# Patient Record
Sex: Male | Born: 1935 | Race: Black or African American | Hispanic: No | Marital: Married | Smoking: Former smoker
Health system: Southern US, Community
[De-identification: ages and names within clinical notes are randomized; demographics above are authoritative.]

## PROBLEM LIST (undated history)

## (undated) DIAGNOSIS — I2510 Atherosclerotic heart disease of native coronary artery without angina pectoris: Secondary | ICD-10-CM

## (undated) DIAGNOSIS — Z8709 Personal history of other diseases of the respiratory system: Secondary | ICD-10-CM

## (undated) DIAGNOSIS — I480 Paroxysmal atrial fibrillation: Secondary | ICD-10-CM

## (undated) DIAGNOSIS — Z8719 Personal history of other diseases of the digestive system: Secondary | ICD-10-CM

## (undated) DIAGNOSIS — I5042 Chronic combined systolic (congestive) and diastolic (congestive) heart failure: Secondary | ICD-10-CM

## (undated) DIAGNOSIS — I639 Cerebral infarction, unspecified: Secondary | ICD-10-CM

## (undated) DIAGNOSIS — D649 Anemia, unspecified: Secondary | ICD-10-CM

## (undated) DIAGNOSIS — I6521 Occlusion and stenosis of right carotid artery: Secondary | ICD-10-CM

## (undated) DIAGNOSIS — I119 Hypertensive heart disease without heart failure: Secondary | ICD-10-CM

## (undated) DIAGNOSIS — Z950 Presence of cardiac pacemaker: Secondary | ICD-10-CM

## (undated) DIAGNOSIS — E785 Hyperlipidemia, unspecified: Secondary | ICD-10-CM

## (undated) DIAGNOSIS — I447 Left bundle-branch block, unspecified: Secondary | ICD-10-CM

## (undated) DIAGNOSIS — R0600 Dyspnea, unspecified: Secondary | ICD-10-CM

## (undated) DIAGNOSIS — N183 Chronic kidney disease, stage 3 unspecified: Secondary | ICD-10-CM

## (undated) DIAGNOSIS — M1990 Unspecified osteoarthritis, unspecified site: Secondary | ICD-10-CM

## (undated) DIAGNOSIS — I495 Sick sinus syndrome: Secondary | ICD-10-CM

## (undated) HISTORY — DX: Atherosclerotic heart disease of native coronary artery without angina pectoris: I25.10

## (undated) HISTORY — DX: Chronic kidney disease, stage 3 unspecified: N18.30

## (undated) HISTORY — DX: Hyperlipidemia, unspecified: E78.5

## (undated) HISTORY — DX: Presence of cardiac pacemaker: Z95.0

## (undated) HISTORY — DX: Personal history of other diseases of the digestive system: Z87.19

## (undated) HISTORY — DX: Cerebral infarction, unspecified: I63.9

## (undated) HISTORY — DX: Chronic kidney disease, stage 3 (moderate): N18.3

## (undated) HISTORY — DX: Chronic combined systolic (congestive) and diastolic (congestive) heart failure: I50.42

## (undated) HISTORY — DX: Anemia, unspecified: D64.9

## (undated) HISTORY — PX: PROSTATECTOMY: SHX69

## (undated) HISTORY — DX: Left bundle-branch block, unspecified: I44.7

## (undated) HISTORY — PX: HEMORRHOID SURGERY: SHX153

## (undated) HISTORY — DX: Personal history of other diseases of the respiratory system: Z87.09

## (undated) HISTORY — DX: Sick sinus syndrome: I49.5

---

## 1987-11-10 HISTORY — PX: CORONARY ARTERY BYPASS GRAFT: SHX141

## 1999-08-15 ENCOUNTER — Ambulatory Visit (HOSPITAL_COMMUNITY): Admission: RE | Admit: 1999-08-15 | Discharge: 1999-08-15 | Payer: Self-pay | Admitting: Cardiology

## 2000-07-13 ENCOUNTER — Ambulatory Visit (HOSPITAL_COMMUNITY): Admission: RE | Admit: 2000-07-13 | Discharge: 2000-07-13 | Payer: Self-pay | Admitting: General Surgery

## 2000-07-13 ENCOUNTER — Encounter (INDEPENDENT_AMBULATORY_CARE_PROVIDER_SITE_OTHER): Payer: Self-pay | Admitting: Specialist

## 2001-12-24 ENCOUNTER — Encounter: Payer: Self-pay | Admitting: Cardiology

## 2001-12-24 ENCOUNTER — Encounter: Admission: RE | Admit: 2001-12-24 | Discharge: 2001-12-24 | Payer: Self-pay | Admitting: Cardiology

## 2002-08-16 ENCOUNTER — Ambulatory Visit (HOSPITAL_COMMUNITY): Admission: RE | Admit: 2002-08-16 | Discharge: 2002-08-16 | Payer: Self-pay | Admitting: Cardiology

## 2002-08-16 ENCOUNTER — Encounter: Payer: Self-pay | Admitting: Cardiology

## 2002-09-13 ENCOUNTER — Ambulatory Visit (HOSPITAL_COMMUNITY): Admission: RE | Admit: 2002-09-13 | Discharge: 2002-09-13 | Payer: Self-pay | Admitting: *Deleted

## 2002-11-09 HISTORY — PX: CORONARY ANGIOPLASTY WITH STENT PLACEMENT: SHX49

## 2002-11-09 HISTORY — PX: CATARACT EXTRACTION: SUR2

## 2002-12-08 ENCOUNTER — Encounter: Payer: Self-pay | Admitting: Cardiology

## 2002-12-08 ENCOUNTER — Ambulatory Visit (HOSPITAL_COMMUNITY): Admission: RE | Admit: 2002-12-08 | Discharge: 2002-12-08 | Payer: Self-pay | Admitting: Cardiology

## 2003-07-24 ENCOUNTER — Encounter: Payer: Self-pay | Admitting: Cardiology

## 2003-07-24 ENCOUNTER — Ambulatory Visit (HOSPITAL_COMMUNITY): Admission: RE | Admit: 2003-07-24 | Discharge: 2003-07-25 | Payer: Self-pay | Admitting: Cardiology

## 2004-06-23 ENCOUNTER — Ambulatory Visit (HOSPITAL_COMMUNITY): Admission: RE | Admit: 2004-06-23 | Discharge: 2004-06-23 | Payer: Self-pay | Admitting: Cardiology

## 2005-02-17 ENCOUNTER — Encounter: Admission: RE | Admit: 2005-02-17 | Discharge: 2005-02-17 | Payer: Self-pay | Admitting: Cardiology

## 2005-04-22 ENCOUNTER — Encounter: Admission: RE | Admit: 2005-04-22 | Discharge: 2005-04-22 | Payer: Self-pay | Admitting: Cardiology

## 2005-07-23 ENCOUNTER — Ambulatory Visit (HOSPITAL_COMMUNITY): Admission: RE | Admit: 2005-07-23 | Discharge: 2005-07-23 | Payer: Self-pay | Admitting: Cardiology

## 2005-12-31 ENCOUNTER — Ambulatory Visit (HOSPITAL_COMMUNITY): Admission: RE | Admit: 2005-12-31 | Discharge: 2005-12-31 | Payer: Self-pay | Admitting: Gastroenterology

## 2006-07-07 ENCOUNTER — Emergency Department (HOSPITAL_COMMUNITY): Admission: EM | Admit: 2006-07-07 | Discharge: 2006-07-07 | Payer: Self-pay | Admitting: Emergency Medicine

## 2006-07-10 ENCOUNTER — Emergency Department (HOSPITAL_COMMUNITY): Admission: EM | Admit: 2006-07-10 | Discharge: 2006-07-10 | Payer: Self-pay | Admitting: Emergency Medicine

## 2006-07-14 ENCOUNTER — Inpatient Hospital Stay (HOSPITAL_COMMUNITY): Admission: AD | Admit: 2006-07-14 | Discharge: 2006-07-23 | Payer: Self-pay | Admitting: Cardiology

## 2006-07-14 ENCOUNTER — Encounter (INDEPENDENT_AMBULATORY_CARE_PROVIDER_SITE_OTHER): Payer: Self-pay | Admitting: Cardiology

## 2006-07-14 ENCOUNTER — Ambulatory Visit: Payer: Self-pay | Admitting: Infectious Diseases

## 2006-08-10 ENCOUNTER — Encounter: Admission: RE | Admit: 2006-08-10 | Discharge: 2006-08-10 | Payer: Self-pay | Admitting: Cardiology

## 2007-02-14 ENCOUNTER — Ambulatory Visit: Payer: Self-pay | Admitting: Oncology

## 2007-03-10 LAB — CBC & DIFF AND RETIC
BASO%: 0.4 % (ref 0.0–2.0)
Basophils Absolute: 0 10*3/uL (ref 0.0–0.1)
EOS%: 3.3 % (ref 0.0–7.0)
HCT: 39.3 % (ref 38.7–49.9)
HGB: 13.7 g/dL (ref 13.0–17.1)
IRF: 0.44 — ABNORMAL HIGH (ref 0.070–0.380)
MCH: 29.7 pg (ref 28.0–33.4)
MCHC: 34.8 g/dL (ref 32.0–35.9)
MONO#: 0.5 10*3/uL (ref 0.1–0.9)
NEUT%: 61.5 % (ref 40.0–75.0)
RDW: 21 % — ABNORMAL HIGH (ref 11.2–14.6)
RETIC #: 88.8 10*3/uL (ref 31.8–103.9)
WBC: 4.3 10*3/uL (ref 4.0–10.0)
lymph#: 0.9 10*3/uL (ref 0.9–3.3)

## 2007-03-14 LAB — FOLATE RBC: RBC Folate: 647 ng/mL — ABNORMAL HIGH (ref 180–600)

## 2007-03-14 LAB — COMPREHENSIVE METABOLIC PANEL
AST: 12 U/L (ref 0–37)
Albumin: 4 g/dL (ref 3.5–5.2)
Alkaline Phosphatase: 163 U/L — ABNORMAL HIGH (ref 39–117)
BUN: 14 mg/dL (ref 6–23)
Creatinine, Ser: 0.99 mg/dL (ref 0.40–1.50)
Potassium: 3.7 mEq/L (ref 3.5–5.3)
Total Bilirubin: 0.6 mg/dL (ref 0.3–1.2)

## 2007-03-14 LAB — IRON AND TIBC
%SAT: 17 % — ABNORMAL LOW (ref 20–55)
TIBC: 258 ug/dL (ref 215–435)
UIBC: 215 ug/dL

## 2007-03-14 LAB — IMMUNOFIXATION ELECTROPHORESIS
IgG (Immunoglobin G), Serum: 1890 mg/dL — ABNORMAL HIGH (ref 694–1618)
Total Protein, Serum Electrophoresis: 7.5 g/dL (ref 6.0–8.3)

## 2007-03-14 LAB — TRANSFERRIN RECEPTOR, SOLUABLE: Transferrin Receptor, Soluble: 24.1 nmol/L

## 2007-04-26 ENCOUNTER — Ambulatory Visit (HOSPITAL_COMMUNITY): Admission: RE | Admit: 2007-04-26 | Discharge: 2007-04-26 | Payer: Self-pay | Admitting: *Deleted

## 2007-04-26 ENCOUNTER — Encounter (INDEPENDENT_AMBULATORY_CARE_PROVIDER_SITE_OTHER): Payer: Self-pay | Admitting: Cardiology

## 2007-04-26 ENCOUNTER — Ambulatory Visit: Payer: Self-pay | Admitting: Vascular Surgery

## 2007-07-05 ENCOUNTER — Ambulatory Visit: Payer: Self-pay | Admitting: Oncology

## 2007-07-07 LAB — CBC WITH DIFFERENTIAL/PLATELET
Basophils Absolute: 0 10*3/uL (ref 0.0–0.1)
HCT: 39.5 % (ref 38.7–49.9)
HGB: 13.9 g/dL (ref 13.0–17.1)
MONO#: 0.5 10*3/uL (ref 0.1–0.9)
NEUT#: 2.2 10*3/uL (ref 1.5–6.5)
NEUT%: 62.4 % (ref 40.0–75.0)
RDW: 13.9 % (ref 11.2–14.6)
WBC: 3.6 10*3/uL — ABNORMAL LOW (ref 4.0–10.0)
lymph#: 0.7 10*3/uL — ABNORMAL LOW (ref 0.9–3.3)

## 2007-07-07 LAB — IRON AND TIBC
%SAT: 13 % — ABNORMAL LOW (ref 20–55)
TIBC: 248 ug/dL (ref 215–435)
UIBC: 217 ug/dL

## 2007-07-07 LAB — FERRITIN: Ferritin: 40 ng/mL (ref 22–322)

## 2007-10-13 ENCOUNTER — Encounter: Admission: RE | Admit: 2007-10-13 | Discharge: 2007-10-13 | Payer: Self-pay | Admitting: Cardiology

## 2007-11-08 ENCOUNTER — Ambulatory Visit: Payer: Self-pay | Admitting: Oncology

## 2007-11-11 LAB — CBC WITH DIFFERENTIAL/PLATELET
BASO%: 0.3 % (ref 0.0–2.0)
Basophils Absolute: 0 10*3/uL (ref 0.0–0.1)
EOS%: 2.6 % (ref 0.0–7.0)
HGB: 14 g/dL (ref 13.0–17.1)
MCH: 30.9 pg (ref 28.0–33.4)
MCHC: 34.5 g/dL (ref 32.0–35.9)
MCV: 89.7 fL (ref 81.6–98.0)
MONO%: 12.4 % (ref 0.0–13.0)
RDW: 15.4 % — ABNORMAL HIGH (ref 11.2–14.6)

## 2007-11-11 LAB — IRON AND TIBC: Iron: 124 ug/dL (ref 42–165)

## 2008-03-07 ENCOUNTER — Ambulatory Visit: Payer: Self-pay | Admitting: Oncology

## 2008-03-09 LAB — CBC WITH DIFFERENTIAL/PLATELET
Basophils Absolute: 0 10*3/uL (ref 0.0–0.1)
Eosinophils Absolute: 0.2 10*3/uL (ref 0.0–0.5)
HGB: 13 g/dL (ref 13.0–17.1)
MCV: 89.7 fL (ref 81.6–98.0)
MONO%: 11.5 % (ref 0.0–13.0)
NEUT#: 2.3 10*3/uL (ref 1.5–6.5)
RDW: 14.3 % (ref 11.2–14.6)
lymph#: 0.8 10*3/uL — ABNORMAL LOW (ref 0.9–3.3)

## 2008-03-09 LAB — COMPREHENSIVE METABOLIC PANEL WITH GFR
ALT: 9 U/L (ref 0–53)
AST: 13 U/L (ref 0–37)
Albumin: 3.7 g/dL (ref 3.5–5.2)
Alkaline Phosphatase: 130 U/L — ABNORMAL HIGH (ref 39–117)
BUN: 16 mg/dL (ref 6–23)
CO2: 25 meq/L (ref 19–32)
Calcium: 8.5 mg/dL (ref 8.4–10.5)
Chloride: 109 meq/L (ref 96–112)
Creatinine, Ser: 0.95 mg/dL (ref 0.40–1.50)
Glucose, Bld: 123 mg/dL — ABNORMAL HIGH (ref 70–99)
Potassium: 3.6 meq/L (ref 3.5–5.3)
Sodium: 143 meq/L (ref 135–145)
Total Bilirubin: 0.6 mg/dL (ref 0.3–1.2)
Total Protein: 7 g/dL (ref 6.0–8.3)

## 2008-03-09 LAB — IRON AND TIBC: %SAT: 24 % (ref 20–55)

## 2008-03-09 LAB — FERRITIN: Ferritin: 47 ng/mL (ref 22–322)

## 2008-03-28 ENCOUNTER — Ambulatory Visit: Payer: Self-pay | Admitting: Vascular Surgery

## 2008-03-28 ENCOUNTER — Encounter (INDEPENDENT_AMBULATORY_CARE_PROVIDER_SITE_OTHER): Payer: Self-pay | Admitting: Cardiology

## 2008-03-28 ENCOUNTER — Ambulatory Visit (HOSPITAL_COMMUNITY): Admission: RE | Admit: 2008-03-28 | Discharge: 2008-03-28 | Payer: Self-pay | Admitting: Cardiology

## 2008-07-10 DIAGNOSIS — I495 Sick sinus syndrome: Secondary | ICD-10-CM | POA: Diagnosis present

## 2008-07-10 DIAGNOSIS — Z950 Presence of cardiac pacemaker: Secondary | ICD-10-CM | POA: Diagnosis present

## 2008-07-10 HISTORY — DX: Sick sinus syndrome: I49.5

## 2008-07-10 HISTORY — DX: Presence of cardiac pacemaker: Z95.0

## 2008-07-13 ENCOUNTER — Encounter: Admission: RE | Admit: 2008-07-13 | Discharge: 2008-07-13 | Payer: Self-pay | Admitting: Cardiology

## 2008-07-15 ENCOUNTER — Inpatient Hospital Stay (HOSPITAL_COMMUNITY): Admission: EM | Admit: 2008-07-15 | Discharge: 2008-07-18 | Payer: Self-pay | Admitting: Emergency Medicine

## 2008-07-15 ENCOUNTER — Ambulatory Visit: Payer: Self-pay | Admitting: Internal Medicine

## 2008-07-16 ENCOUNTER — Encounter (INDEPENDENT_AMBULATORY_CARE_PROVIDER_SITE_OTHER): Payer: Self-pay | Admitting: Cardiology

## 2008-07-17 HISTORY — PX: INSERT / REPLACE / REMOVE PACEMAKER: SUR710

## 2008-08-01 ENCOUNTER — Ambulatory Visit: Payer: Self-pay

## 2008-08-08 ENCOUNTER — Inpatient Hospital Stay (HOSPITAL_COMMUNITY): Admission: AD | Admit: 2008-08-08 | Discharge: 2008-08-16 | Payer: Self-pay | Admitting: Cardiology

## 2008-08-29 ENCOUNTER — Ambulatory Visit: Payer: Self-pay | Admitting: Oncology

## 2008-08-31 LAB — CBC WITH DIFFERENTIAL/PLATELET
Eosinophils Absolute: 0.2 10*3/uL (ref 0.0–0.5)
MONO#: 0.5 10*3/uL (ref 0.1–0.9)
MONO%: 9.8 % (ref 0.0–13.0)
NEUT#: 3.4 10*3/uL (ref 1.5–6.5)
RBC: 3.61 10*6/uL — ABNORMAL LOW (ref 4.20–5.71)
RDW: 18.1 % — ABNORMAL HIGH (ref 11.2–14.6)
WBC: 4.7 10*3/uL (ref 4.0–10.0)
lymph#: 0.7 10*3/uL — ABNORMAL LOW (ref 0.9–3.3)

## 2008-09-04 LAB — COMPREHENSIVE METABOLIC PANEL
Albumin: 3.7 g/dL (ref 3.5–5.2)
Alkaline Phosphatase: 101 U/L (ref 39–117)
CO2: 25 mEq/L (ref 19–32)
Chloride: 109 mEq/L (ref 96–112)
Glucose, Bld: 136 mg/dL — ABNORMAL HIGH (ref 70–99)
Potassium: 3.6 mEq/L (ref 3.5–5.3)
Sodium: 142 mEq/L (ref 135–145)
Total Protein: 6.5 g/dL (ref 6.0–8.3)

## 2008-09-04 LAB — IRON AND TIBC: Iron: 26 ug/dL — ABNORMAL LOW (ref 42–165)

## 2008-09-04 LAB — FERRITIN: Ferritin: 55 ng/mL (ref 22–322)

## 2008-10-23 ENCOUNTER — Ambulatory Visit: Payer: Self-pay | Admitting: Internal Medicine

## 2008-11-12 ENCOUNTER — Inpatient Hospital Stay (HOSPITAL_COMMUNITY): Admission: EM | Admit: 2008-11-12 | Discharge: 2008-11-16 | Payer: Self-pay | Admitting: Emergency Medicine

## 2009-02-27 ENCOUNTER — Ambulatory Visit: Payer: Self-pay | Admitting: Oncology

## 2009-03-01 ENCOUNTER — Encounter (INDEPENDENT_AMBULATORY_CARE_PROVIDER_SITE_OTHER): Payer: Self-pay | Admitting: Radiology

## 2009-03-01 LAB — CBC WITH DIFFERENTIAL/PLATELET
BASO%: 0.1 % (ref 0.0–2.0)
LYMPH%: 18.7 % (ref 14.0–49.0)
MCHC: 34 g/dL (ref 32.0–36.0)
MONO#: 0.5 10*3/uL (ref 0.1–0.9)
MONO%: 12.7 % (ref 0.0–14.0)
Platelets: 140 10*3/uL (ref 140–400)
RBC: 4.42 10*6/uL (ref 4.20–5.82)
RDW: 14.7 % — ABNORMAL HIGH (ref 11.0–14.6)
WBC: 3.8 10*3/uL — ABNORMAL LOW (ref 4.0–10.3)

## 2009-03-04 LAB — COMPREHENSIVE METABOLIC PANEL
ALT: 8 U/L (ref 0–53)
Albumin: 3.9 g/dL (ref 3.5–5.2)
Alkaline Phosphatase: 137 U/L — ABNORMAL HIGH (ref 39–117)
CO2: 27 mEq/L (ref 19–32)
Potassium: 3.6 mEq/L (ref 3.5–5.3)
Sodium: 141 mEq/L (ref 135–145)
Total Bilirubin: 0.4 mg/dL (ref 0.3–1.2)
Total Protein: 7.6 g/dL (ref 6.0–8.3)

## 2009-03-04 LAB — IRON AND TIBC
%SAT: 16 % — ABNORMAL LOW (ref 20–55)
TIBC: 232 ug/dL (ref 215–435)

## 2009-07-07 DIAGNOSIS — Z950 Presence of cardiac pacemaker: Secondary | ICD-10-CM | POA: Insufficient documentation

## 2009-07-07 DIAGNOSIS — I200 Unstable angina: Secondary | ICD-10-CM | POA: Insufficient documentation

## 2009-07-07 DIAGNOSIS — I10 Essential (primary) hypertension: Secondary | ICD-10-CM | POA: Insufficient documentation

## 2009-07-07 DIAGNOSIS — Z951 Presence of aortocoronary bypass graft: Secondary | ICD-10-CM | POA: Insufficient documentation

## 2009-07-12 ENCOUNTER — Ambulatory Visit: Payer: Self-pay | Admitting: Internal Medicine

## 2009-08-29 ENCOUNTER — Ambulatory Visit: Payer: Self-pay | Admitting: Oncology

## 2009-09-02 LAB — CBC WITH DIFFERENTIAL/PLATELET
BASO%: 0.5 % (ref 0.0–2.0)
EOS%: 3.8 % (ref 0.0–7.0)
LYMPH%: 25.1 % (ref 14.0–49.0)
MCHC: 33.4 g/dL (ref 32.0–36.0)
MCV: 92.5 fL (ref 79.3–98.0)
MONO%: 13.6 % (ref 0.0–14.0)
NEUT#: 2.4 10*3/uL (ref 1.5–6.5)
Platelets: 172 10*3/uL (ref 140–400)
RBC: 3.88 10*6/uL — ABNORMAL LOW (ref 4.20–5.82)
RDW: 14.8 % — ABNORMAL HIGH (ref 11.0–14.6)
nRBC: 0 % (ref 0–0)

## 2009-09-04 LAB — COMPREHENSIVE METABOLIC PANEL
ALT: 9 U/L (ref 0–53)
AST: 10 U/L (ref 0–37)
Alkaline Phosphatase: 116 U/L (ref 39–117)
CO2: 25 mEq/L (ref 19–32)
Creatinine, Ser: 1.17 mg/dL (ref 0.40–1.50)
Sodium: 139 mEq/L (ref 135–145)
Total Bilirubin: 0.7 mg/dL (ref 0.3–1.2)
Total Protein: 7.1 g/dL (ref 6.0–8.3)

## 2009-09-04 LAB — LACTATE DEHYDROGENASE: LDH: 134 U/L (ref 94–250)

## 2009-09-04 LAB — FERRITIN: Ferritin: 183 ng/mL (ref 22–322)

## 2009-09-04 LAB — IRON AND TIBC
%SAT: 33 % (ref 20–55)
TIBC: 201 ug/dL — ABNORMAL LOW (ref 215–435)

## 2009-09-06 ENCOUNTER — Ambulatory Visit: Payer: Self-pay | Admitting: Internal Medicine

## 2009-12-06 ENCOUNTER — Ambulatory Visit: Payer: Self-pay | Admitting: Internal Medicine

## 2010-03-05 ENCOUNTER — Ambulatory Visit: Payer: Self-pay | Admitting: Oncology

## 2010-03-06 LAB — CBC WITH DIFFERENTIAL/PLATELET
Basophils Absolute: 0 10*3/uL (ref 0.0–0.1)
EOS%: 3.7 % (ref 0.0–7.0)
HGB: 12.9 g/dL — ABNORMAL LOW (ref 13.0–17.1)
LYMPH%: 38.5 % (ref 14.0–49.0)
MCH: 32.8 pg (ref 27.2–33.4)
MCV: 95.7 fL (ref 79.3–98.0)
MONO%: 10 % (ref 0.0–14.0)
NEUT%: 47.3 % (ref 39.0–75.0)
Platelets: 155 10*3/uL (ref 140–400)
RDW: 14.4 % (ref 11.0–14.6)

## 2010-03-07 ENCOUNTER — Ambulatory Visit: Payer: Self-pay | Admitting: Internal Medicine

## 2010-03-08 LAB — IRON AND TIBC
%SAT: 41 % (ref 20–55)
Iron: 99 ug/dL (ref 42–165)
TIBC: 243 ug/dL (ref 215–435)
UIBC: 144 ug/dL

## 2010-03-08 LAB — COMPREHENSIVE METABOLIC PANEL
AST: 10 U/L (ref 0–37)
Alkaline Phosphatase: 101 U/L (ref 39–117)
BUN: 18 mg/dL (ref 6–23)
Creatinine, Ser: 1.33 mg/dL (ref 0.40–1.50)
Potassium: 3.8 mEq/L (ref 3.5–5.3)
Total Bilirubin: 0.8 mg/dL (ref 0.3–1.2)

## 2010-03-08 LAB — FERRITIN: Ferritin: 185 ng/mL (ref 22–322)

## 2010-03-08 LAB — TRANSFERRIN RECEPTOR, SOLUABLE: Transferrin Receptor, Soluble: 16.1 nmol/L

## 2010-05-14 ENCOUNTER — Encounter: Admission: RE | Admit: 2010-05-14 | Discharge: 2010-05-14 | Payer: Self-pay | Admitting: Cardiology

## 2010-06-06 ENCOUNTER — Encounter: Payer: Self-pay | Admitting: Internal Medicine

## 2010-06-06 ENCOUNTER — Ambulatory Visit: Payer: Self-pay | Admitting: Internal Medicine

## 2010-07-11 ENCOUNTER — Encounter: Admission: RE | Admit: 2010-07-11 | Discharge: 2010-07-11 | Payer: Self-pay | Admitting: Cardiology

## 2010-08-12 ENCOUNTER — Ambulatory Visit: Payer: Self-pay | Admitting: Internal Medicine

## 2010-09-02 ENCOUNTER — Ambulatory Visit: Payer: Self-pay | Admitting: Oncology

## 2010-09-04 LAB — COMPREHENSIVE METABOLIC PANEL
ALT: 10 U/L (ref 0–53)
BUN: 16 mg/dL (ref 6–23)
CO2: 25 mEq/L (ref 19–32)
Calcium: 8.3 mg/dL — ABNORMAL LOW (ref 8.4–10.5)
Chloride: 107 mEq/L (ref 96–112)
Creatinine, Ser: 1.1 mg/dL (ref 0.40–1.50)

## 2010-09-04 LAB — CBC WITH DIFFERENTIAL/PLATELET
Basophils Absolute: 0 10*3/uL (ref 0.0–0.1)
Eosinophils Absolute: 0.2 10*3/uL (ref 0.0–0.5)
HCT: 33.9 % — ABNORMAL LOW (ref 38.4–49.9)
HGB: 11.3 g/dL — ABNORMAL LOW (ref 13.0–17.1)
LYMPH%: 26.8 % (ref 14.0–49.0)
MCV: 93.9 fL (ref 79.3–98.0)
MONO%: 14 % (ref 0.0–14.0)
NEUT#: 2 10*3/uL (ref 1.5–6.5)
Platelets: 139 10*3/uL — ABNORMAL LOW (ref 140–400)

## 2010-09-04 LAB — IRON AND TIBC
%SAT: 22 % (ref 20–55)
Iron: 48 ug/dL (ref 42–165)

## 2010-09-05 ENCOUNTER — Ambulatory Visit: Payer: Self-pay | Admitting: Internal Medicine

## 2010-11-30 ENCOUNTER — Encounter: Payer: Self-pay | Admitting: Cardiovascular Disease

## 2010-12-05 ENCOUNTER — Encounter: Payer: Self-pay | Admitting: Internal Medicine

## 2010-12-09 ENCOUNTER — Ambulatory Visit: Payer: Self-pay | Admitting: Internal Medicine

## 2010-12-09 NOTE — Cardiovascular Report (Signed)
Summary: TTM   TTM   Imported By: Roderic Ovens 09/23/2010 16:16:41  _____________________________________________________________________  External Attachment:    Type:   Image     Comment:   External Document

## 2010-12-09 NOTE — Cardiovascular Report (Signed)
Summary: TTM   TTM   Imported By: Roderic Ovens 04/03/2010 11:33:08  _____________________________________________________________________  External Attachment:    Type:   Image     Comment:   External Document

## 2010-12-09 NOTE — Cardiovascular Report (Signed)
Summary: TTM   TTM   Imported By: Roderic Ovens 07/10/2010 11:38:16  _____________________________________________________________________  External Attachment:    Type:   Image     Comment:   External Document

## 2010-12-09 NOTE — Assessment & Plan Note (Signed)
Summary: pc2   Visit Type:  Follow-up Primary Provider:  Donia Peck,    History of Present Illness: [name] returns today for PPM followup.  He is an 77 yo man with a h/o symptomatic bradycardia, HTN and CAD.  He denies c/p or sob.  He notes mild peripheral edema.  He remains active.  No syncope or dizziness.  Current Medications (verified): 1)  Plavix 75 Mg Tabs (Clopidogrel Bisulfate) .... Take One Tablet By Mouth Daily 2)  Carvedilol 25 Mg Tabs (Carvedilol) .... Take One Tablet By Mouth Twice A Day 3)  Furosemide 20 Mg Tabs (Furosemide) .... Take One Tablet By Mouth Daily. 4)  Aspirin 81 Mg Tbec (Aspirin) .... Take One Tablet By Mouth Daily 5)  Ranexa 500 Mg Xr12h-Tab (Ranolazine) .Marland Kitchen.. 1 Tab By Mouth Two Times A Day 6)  Temazepam 30 Mg Caps (Temazepam) .Marland Kitchen.. 1 Tab By Mouth At Bedtime 7)  Tamsulosin Hcl 0.4 Mg Caps (Tamsulosin Hcl) .... Once Daily 8)  Isosorbide Mononitrate Cr 60 Mg Xr24h-Tab (Isosorbide Mononitrate) .... Take One Tablet By Mouth Daily 9)  Lorazepam 1 Mg Tabs (Lorazepam) .Marland Kitchen.. 1 Tab By Mouth Three Times A Day As Needed 10)  Amlodipine Besylate 5 Mg Tabs (Amlodipine Besylate) .... Take One Tablet By Mouth Daily 11)  Ferrous Sulfate 325 (65 Fe) Mg  Tabs (Ferrous Sulfate) .... Three Times A Day 12)  Simvastatin 40 Mg Tabs (Simvastatin) .... Take One Tablet By Mouth Daily At Bedtime 13)  Stool Softener 100 Mg Caps (Docusate Sodium) .... 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    DATE OF PROCEDURE:  08/13/2008  OPERATION:  Colonoscopy    PHYSICIAN:  John Peck, M.D.      DATE OF PROCEDURE:  08/09/2008   PROCEDURE:  EGD.    DATE OF  PROCEDURE:  07/17/2008  :                                  OPERATIVE REPORT      PRIMARY SURGEON:  John Canning. Ladona Ridgel, MD.      FIRST ASSISTANT:  John Range, MD      PREPROCEDURE DIAGNOSES:   1. Persistent atrial fibrillation.   2. Symptomatic bradycardia.   3. Diastolic dysfunction.      POSTPROCEDURE DIAGNOSES:   1. Persistent atrial fibrillation..   2. Symptomatic bradycardia.   3. Diastolic dysfunction.      PROCEDURES:   1. Dual chamber pacemaker implantation.   2. Left upper extremity venography.      PHYSICIAN:  John Peck, M.D.    DATE OF PROCEDURE:  07/21/2006  PROCEDURE:  Upper endoscopy.   PHYSICIAN:  John Friar, MD DATE OF PROCEDURE:  12/31/2005  PROCEDURE PERFORMED:  Colonoscopy.   PHYSICIAN:  John Dross., M.D.               DATE OF PROCEDURE:  09/13/2002                                  OPERATIVE REPORT   PREOPERATIVE DIAGNOSES:  Screening for colon cancer.   POSTOPERATIVE DIAGNOSES:  Diverticulosis  in the rectosigmoid and descending  colon region; otherwise, normal colonoscopic examination to the cecum.   PROCEDURE PERFORMED:  Colonoscopy.  Review of Systems  The patient denies chest pain, syncope, dyspnea on exertion, and peripheral edema.    Vital Signs:  Patient profile:   77 year old male Height:      69 inches Weight:      189 pounds BMI:     28.01 Pulse rate:   60 / minute BP sitting:   128 / 64  (left arm)  Vitals Entered By: John Peck (August 12, 2010 9:44 AM)  Physical Exam  General:  Well developed, well nourished, in no acute distress. Head:  normocephalic and atraumatic Eyes:  PERRLA/EOM intact; conjunctiva and lids normal. Mouth:  Teeth, gums and palate normal. Oral mucosa normal. Neck:  Neck supple, no JVD. No masses, thyromegaly or abnormal cervical nodes. Chest Wall:  Well healed PPM incision. Lungs:  Clear bilaterally to auscultation with no wheezes, rales or rhonchi. Heart:  Distant.  RRR with  normal S1 and S2.  A soft systolic murmur is present at the apex. Abdomen:  Bowel sounds positive; abdomen soft and non-tender without masses, organomegaly, or hernias noted. No hepatosplenomegaly. Msk:  Back normal, normal gait. Muscle strength and tone normal. Pulses:  pulses normal in all 4 extremities Extremities:  No clubbing or cyanosis. Neurologic:  Alert and oriented x 3.   PPM Specifications Following MD:  John Bunting, MD     PPM Vendor:  St Jude     PPM Model Number:  [id number]     PPM Serial Number:  [id number] PPM DOI:  07/17/2008     PPM Implanting MD:  John Bunting, MD  Lead 1    Location: RA     DOI: 07/17/2008     Model #: [id number]     Serial #: [id number]     Status: active Lead 2    Location: RV     DOI: 07/17/2008     Model #: [id number]     Serial #: [id number]     Status: active   Indications:  A-FIB/BRADY   PPM Follow Up Battery Voltage:  2.79 V     Battery Est. Longevity:  10 yrs     Pacer Dependent:  No       PPM Device Measurements Atrium  Amplitude: 4.4 mV, Impedance: 390 ohms, Threshold: 0.25 V at 0.4 msec Right Ventricle  Amplitude: 8.6 mV, Impedance: 470 ohms, Threshold: 1.0 V at 0.4 msec  Episodes MS Episodes:  46     Percent Mode Switch:  <1%     Ventricular High Rate:  0     Atrial Pacing:  90%     Ventricular Pacing:  79%  Parameters Mode:  DDDR     Lower Rate Limit:  60     Upper Rate Limit:  110 Paced AV Delay:  200     Sensed AV Delay:  200 Rate Response Parameters:  Threshold-Auto(-0.5), Slope-10, Reaction time-fast, Recovery time-medium Next Cardiology Appt Due:  02/09/2011 Tech Comments:  46 AMS EPISODES--LONGEST WAS 1 MIN 6 SECONDS.  NORMAL DEVICE FUNCTION. NO CHANGES MADE. MEDNET IN 3 MTHS. ROV IN 6 MTHS W/DEVICE CLINIC. John Peck  August 12, 2010 9:55 AM MD Comments:  Agree with above.  Impression & Recommendations:  Problem # 1:  PACEMAKER, PERMANENT (ICD-V45.01) His device is working normally. Will recheck in several months.  Problem # 2:   HYPERTENSION (ICD-401.9) His  blood pressure is well controlled. A low sodium diet is requested. His updated medication list for this problem includes:    Carvedilol 25 Mg Tabs (Carvedilol) .Marland Kitchen... Take one tablet by mouth twice a day    Furosemide 20 Mg Tabs (Furosemide) .Marland Kitchen... Take one tablet by mouth daily.    Aspirin 81 Mg Tbec (Aspirin) .Marland Kitchen... Take one tablet by mouth daily    Amlodipine Besylate 5 Mg Tabs (Amlodipine besylate) .Marland Kitchen... Take one tablet by mouth daily

## 2010-12-09 NOTE — Cardiovascular Report (Signed)
Summary: TTM   TTM   Imported By: Roderic Ovens 01/06/2010 13:34:22  _____________________________________________________________________  External Attachment:    Type:   Image     Comment:   External Document

## 2010-12-09 NOTE — Cardiovascular Report (Signed)
Summary: Office Visit   Office Visit   Imported By: Roderic Ovens 08/18/2010 11:38:12  _____________________________________________________________________  External Attachment:    Type:   Image     Comment:   External Document

## 2010-12-25 NOTE — Cardiovascular Report (Signed)
Summary: TTM   TTM   Imported By: Roderic Ovens 12/18/2010 14:58:07  _____________________________________________________________________  External Attachment:    Type:   Image     Comment:   External Document

## 2011-02-23 LAB — CBC
HCT: 42.2 % (ref 39.0–52.0)
HCT: 43.6 % (ref 39.0–52.0)
Hemoglobin: 13.8 g/dL (ref 13.0–17.0)
Hemoglobin: 14.3 g/dL (ref 13.0–17.0)
MCHC: 32.8 g/dL (ref 30.0–36.0)
MCHC: 34.3 g/dL (ref 30.0–36.0)
MCV: 90.2 fL (ref 78.0–100.0)
MCV: 91.9 fL (ref 78.0–100.0)
MCV: 92.5 fL (ref 78.0–100.0)
Platelets: 141 10*3/uL — ABNORMAL LOW (ref 150–400)
Platelets: 148 10*3/uL — ABNORMAL LOW (ref 150–400)
RDW: 14 % (ref 11.5–15.5)
RDW: 14 % (ref 11.5–15.5)
RDW: 14.3 % (ref 11.5–15.5)

## 2011-02-23 LAB — CK TOTAL AND CKMB (NOT AT ARMC)
Relative Index: INVALID (ref 0.0–2.5)
Total CK: 66 U/L (ref 7–232)

## 2011-02-23 LAB — POCT I-STAT, CHEM 8
BUN: 12 mg/dL (ref 6–23)
Calcium, Ion: 1.13 mmol/L (ref 1.12–1.32)
Creatinine, Ser: 1.2 mg/dL (ref 0.4–1.5)
Hemoglobin: 13.3 g/dL (ref 13.0–17.0)
Sodium: 142 mEq/L (ref 135–145)
TCO2: 25 mmol/L (ref 0–100)

## 2011-02-23 LAB — CARDIAC PANEL(CRET KIN+CKTOT+MB+TROPI)
CK, MB: 0.9 ng/mL (ref 0.3–4.0)
CK, MB: 1 ng/mL (ref 0.3–4.0)
Relative Index: INVALID (ref 0.0–2.5)
Total CK: 74 U/L (ref 7–232)
Troponin I: 0.01 ng/mL (ref 0.00–0.06)
Troponin I: 0.02 ng/mL (ref 0.00–0.06)

## 2011-02-23 LAB — POCT CARDIAC MARKERS
Myoglobin, poc: 79.2 ng/mL (ref 12–200)
Myoglobin, poc: 88.9 ng/mL (ref 12–200)

## 2011-02-23 LAB — BASIC METABOLIC PANEL
BUN: 18 mg/dL (ref 6–23)
BUN: 8 mg/dL (ref 6–23)
CO2: 25 mEq/L (ref 19–32)
Chloride: 104 mEq/L (ref 96–112)
Chloride: 106 mEq/L (ref 96–112)
Glucose, Bld: 82 mg/dL (ref 70–99)
Glucose, Bld: 86 mg/dL (ref 70–99)
Potassium: 3.5 mEq/L (ref 3.5–5.1)
Potassium: 3.6 mEq/L (ref 3.5–5.1)
Sodium: 139 mEq/L (ref 135–145)

## 2011-02-23 LAB — DIFFERENTIAL
Basophils Absolute: 0 10*3/uL (ref 0.0–0.1)
Basophils Relative: 0 % (ref 0–1)
Eosinophils Absolute: 0.2 10*3/uL (ref 0.0–0.7)
Neutro Abs: 2.3 10*3/uL (ref 1.7–7.7)
Neutrophils Relative %: 58 % (ref 43–77)

## 2011-02-23 LAB — LIPID PANEL: Cholesterol: 212 mg/dL — ABNORMAL HIGH (ref 0–200)

## 2011-02-23 LAB — PROTIME-INR: INR: 1.2 (ref 0.00–1.49)

## 2011-02-23 LAB — TROPONIN I: Troponin I: 0.02 ng/mL (ref 0.00–0.06)

## 2011-03-06 ENCOUNTER — Encounter: Payer: Self-pay | Admitting: Internal Medicine

## 2011-03-06 DIAGNOSIS — I498 Other specified cardiac arrhythmias: Secondary | ICD-10-CM

## 2011-03-12 ENCOUNTER — Other Ambulatory Visit (HOSPITAL_COMMUNITY): Payer: Self-pay | Admitting: Oncology

## 2011-03-12 ENCOUNTER — Encounter (HOSPITAL_BASED_OUTPATIENT_CLINIC_OR_DEPARTMENT_OTHER): Payer: Medicare Other | Admitting: Oncology

## 2011-03-12 DIAGNOSIS — D509 Iron deficiency anemia, unspecified: Secondary | ICD-10-CM

## 2011-03-12 LAB — CBC WITH DIFFERENTIAL/PLATELET
BASO%: 0.4 % (ref 0.0–2.0)
Basophils Absolute: 0 10*3/uL (ref 0.0–0.1)
EOS%: 3.1 % (ref 0.0–7.0)
Eosinophils Absolute: 0.2 10*3/uL (ref 0.0–0.5)
HCT: 34.6 % — ABNORMAL LOW (ref 38.4–49.9)
HGB: 11.5 g/dL — ABNORMAL LOW (ref 13.0–17.1)
LYMPH%: 30.8 % (ref 14.0–49.0)
MCH: 30.6 pg (ref 27.2–33.4)
MCHC: 33.2 g/dL (ref 32.0–36.0)
MCV: 92 fL (ref 79.3–98.0)
MONO#: 0.7 10*3/uL (ref 0.1–0.9)
MONO%: 13.5 % (ref 0.0–14.0)
NEUT#: 2.5 10*3/uL (ref 1.5–6.5)
NEUT%: 52.2 % (ref 39.0–75.0)
Platelets: 156 10*3/uL (ref 140–400)
RBC: 3.76 10*6/uL — ABNORMAL LOW (ref 4.20–5.82)
RDW: 14.6 % (ref 11.0–14.6)
WBC: 4.8 10*3/uL (ref 4.0–10.3)
lymph#: 1.5 10*3/uL (ref 0.9–3.3)
nRBC: 0 % (ref 0–0)

## 2011-03-12 LAB — COMPREHENSIVE METABOLIC PANEL
AST: 11 U/L (ref 0–37)
Alkaline Phosphatase: 102 U/L (ref 39–117)
BUN: 16 mg/dL (ref 6–23)
Glucose, Bld: 89 mg/dL (ref 70–99)
Potassium: 4 mEq/L (ref 3.5–5.3)
Total Bilirubin: 0.8 mg/dL (ref 0.3–1.2)

## 2011-03-12 LAB — LACTATE DEHYDROGENASE: LDH: 129 U/L (ref 94–250)

## 2011-03-12 LAB — FERRITIN: Ferritin: 66 ng/mL (ref 22–322)

## 2011-03-12 LAB — IRON AND TIBC
%SAT: 32 % (ref 20–55)
Iron: 86 ug/dL (ref 42–165)
TIBC: 267 ug/dL (ref 215–435)
UIBC: 181 ug/dL

## 2011-03-24 NOTE — Assessment & Plan Note (Signed)
[facility]                         ELECTROPHYSIOLOGY OFFICE NOTE   NAME:Awadallah, GUTHRIE LEMME                     MRN:          [id number]  DATE:10/23/2008                            DOB:          08-29-1924    Mr. Weidler returns today for followup.  He is a very pleasant man with  a history of paroxysmal AFib, symptomatic bradycardia, hypertension who  is status post pacemaker insertion.  He returns today for followup.  His  main complaint today is that of difficult to cough.  He denies chest  pain.  He denies shortness of breath.  He has had no fevers or chills.   MEDICATIONS:  1. Plavix 75 a day.  2. Carvedilol 25 a day.  3. Lasix 20 a day.  4. Aspirin 81 a day.  5. Iron supplements.   Of note, the patient had previously been on Coumadin, but could not  tolerate secondary to a GI bleed, which he sustained approximately 2  weeks after his pacemaker was placed.   PHYSICAL EXAMINATION:  GENERAL:  He is a pleasant well-appearing man in  no distress.  VITAL SIGNS:  Blood pressure was 176/80, the pulse was 60 and regular,  the respirations were 18, the weight was 182 pounds. NECK:  No jugular  venous distention.  LUNGS:  Clear bilaterally to auscultation.  No wheezes, rales, or  rhonchi are present.  There was no increased work of breathing.  CARDIOVASCULAR:  Regular rate and rhythm.  Normal S1 and S2.  ABDOMEN:  Soft, nontender.  EXTREMITIES:  No edema.   Interrogation of his pacemaker demonstrates a Engineer, structural.  The P-  waves were 4, the R-waves were greater than 12, the impedance 368 in the  atrium and 529 in the ventricle, threshold 0.5 at 0.5 in the A and 0.875  at 0.5 in the RV.  Battery voltage was 2.8 volts.  There were six mode  switches all lasting less than a minute.  He was A pacing 80% of the  time, V pacing 32% of the time.   IMPRESSION:  1. Symptomatic bradycardia.  2. Paroxysmal atrial fibrillation.  3. Status post  pacemaker insertion.   DISCUSSION:  Overall, Mr. Gartman was stable.  His pacemaker is working  normally.  His cough is still bothersome and I have asked him to try  Robitussin and take plenty of liquids.  I have also asked him to stop  smoking.  I will see the patient back in the office in 9 months.     Doylene Canning. Ladona Ridgel, MD  Electronically Signed    GWT/MedQ  DD: 10/23/2008  DT: 10/23/2008  Job #: [id number]   cc:   Osvaldo Shipper. Sutch, M.D.

## 2011-03-24 NOTE — Consult Note (Signed)
NAMEWILBERN, [name]:  [id number]   MEDICAL RECORD NO.:  [id number]          PATIENT TYPE:  INP   LOCATION:  2922                         FACILITY:  [facility]   PHYSICIAN:  Bevelyn Buckles. Bensimhon, MDDATE OF BIRTH:  [birth year]   DATE OF CONSULTATION:  07/16/2008  DATE OF DISCHARGE:                                 CONSULTATION   PRIMARY CARE PHYSICIAN/CARDIOLOGIST:  Osvaldo Shipper. Wynia, M.D.   REASON FOR CONSULTATION:  Slow atrial fibrillation, question need for a  pacemaker.   John Peck is an 77 year old male with a history of coronary artery  disease, status post bypass surgery in 1989.  He also has a history of  hypertension, hyperlipidemia, previous alcohol abuse and paroxysmal  atrial fibrillation.   Apparently over the past month, he has been experiencing progressive  shortness of breath and some orthostatic dizziness.  He went to see Dr.  Shana Chute last week for similar symptoms.  A chest x-ray and some labs  were obtained.  The symptoms got worse.  He came to the emergency room  and he was found to have a heart rate in the 30s, and he was admitted  for symptomatic bradycardia.  In the emergency room, he got 1/2 mg of  atropine and the heart rate came up to the mid 40s.   He had an echocardiogram this admission which showed an ejection  fraction of 70% with no regional wall motion abnormalities.  The aortic  valve was mild to mildly calcified.   REVIEW OF SYSTEMS:  He denies any chest pain or pressure-like he had  before his bypass surgery.  He does say, however, that he gets short of  breath with almost any movement and occasionally at rest.  He has not  had any palpitations.  He denies any syncope.  He does say he gets a  little lightheaded if he gets up too quickly.  He denies any lower  extremity edema, but notices that his belly has been swelling  significantly over the past month.   The remainder of the review of systems is notable for abdominal  pain x1  week, weakness and cough.  The remainder of the review of systems is  negative except for HPI and problem list.   PROBLEMS:  1. Coronary artery disease, status post bypass surgery in 1989.      a.     Last cardiac catheterization in 2004, results showed left       main was patent, the LAD was totally occluded proximally.  There       was a 60-70% lesion in the first diagonal.  The left circumflex       had 50-60% proximal stenosis.  The marginals were small and       diffusely diseased.  The RCA had a 30-40% proximal stenosis and a       90% mid stenosis.  There was a 90% stenosis is the acute marginal.       The saphenous vein graft to the ramus and left circumflex was       occluded at the ostium.  The LIMA to the LAD was patent and filled       the native LAD, though the distal LAD was diffusely diseased.  He       received a Cypher drug-eluting stent to the mid RCA.      b.     Stress Myoview in November 2009, showed no ischemia.  There       was a normal ejection fraction.  2. Chronic atrial fibrillation.  3. Hypertension.  4. Hyperlipidemia.  5. 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  6. Anemia.  7. History of alcohol abuse, resolved.   MEDICATIONS PRIOR TO ADMISSION:  1. Coreg 25 b.i.d.  2. Lasix 20 a day.  3. Plavix 75 a day.  4. Ativan 1 mg t.i.d.  5. Imdur 30.  6. Uroxatral and Exforge 5/320.  7. Currently he is on aspirin 325, Plavix and Lovenox.   ALLERGIES:  NO KNOWN DRUG ALLERGIES.   SOCIAL HISTORY:  He lives in [place] with his wife.  He is a retired  [employer].  He has a history of tobacco, 2 packs per week which is  ongoing.  He has also a history alcohol abuse and says he quit in 1980.   FAMILY HISTORY:  Mother died of old age.  Father died of cancer.  Siblings have a history of MIs and colon cancer.   PHYSICAL EXAMINATION:  GENERAL:  He is an elderly male lying in bed in  no acute distress.  He is able to speak in full  sentences, but he does  have some expiratory wheezing.  VITAL SIGNS:  Temperature is 98.1, blood pressure is 139/94, heart rate  40 and irregular.  His respiratory rate is 16.  HEENT:  Normal except for poor dentition.  NECK:  Supple.  JVP is hard to see, but appears elevated past the angle  of the jaw.  Carotids are 2+ bilaterally.  There is no lymphadenopathy  or thyromegaly.  CARDIAC:  He has distant heart sounds.  He is bradycardic and irregular.  He has a 2/6 systolic ejection murmur at the right sternal border.  S2  is preserved.  LUNGS:  Diminished, but has mild expiratory wheezing throughout.  ABDOMEN:  Obese, distended.  It is nontender, unable to appreciate  hepatosplenomegaly.  There are no bruits or masses.  EXTREMITIES:  Warm with no cyanosis, clubbing or edema.  NEURO:  Alert and x3.  Cranial nerves II-XII are intact.  Moves all  fours without difficulty.  Affect is pleasant.   EKG shows atrial fibrillation with slow ventricular response at a rate  of 39.  There is no ST-T wave abnormalities.  White count 3.9,  hemoglobin is 12.4, platelets of 126.  Sodium 139, potassium 3.3, BUN  11, creatinine 1.12.  TSH is normal.  Troponins have all been normal of  less than 0.05.  MBs have been less than 1.  Digoxin is less than 0.02.   ASSESSMENT/PLAN:  John Peck is a very pleasant 77 year old male with  multiple medical problems, who now has clear evidence of AV node  dysfunction with symptomatic bradycardia, and he will certainly need a  pacemaker.  Will have EP to see him in the morning.  We will keep him  n.p.o.   However, I am not sure that his bradycardia explained all his symptoms  and he appears to have significant volume overload and probable  diastolic heart failure on exam.  We will start IV diuresis.  Further  management of his heart failure and need for invasive or noninvasive  cardiac evaluation will be left to Dr. Shana Chute.  I also would suggest  consideration of  Coumadin for his chronic atrial fibrillation now that  he is no longer drinking.      Bevelyn Buckles. Bensimhon, MD  Electronically Signed     DRB/MEDQ  D:  07/16/2008  T:  07/16/2008  Job:  [id number]

## 2011-03-24 NOTE — Op Note (Signed)
NAMECHRISTOHPER, John Peck              ACCOUNT NO.:  [id number]   MEDICAL RECORD NO.:  [id number]          PATIENT TYPE:  INP   LOCATION:  4705                         FACILITY:  [facility]   PHYSICIAN:  John C. Madilyn Fireman, M.D.    DATE OF BIRTH:  [birth year]   DATE OF PROCEDURE:  08/13/2008  DATE OF DISCHARGE:                               OPERATIVE REPORT   OPERATION:  Colonoscopy   INDICATIONS FOR PROCEDURE:  GI bleeding with normal endoscopy.   PROCEDURE:  The patient was placed in the left lateral decubitus  position and placed on the pulse monitor with continuous low-flow oxygen  delivered by nasal cannula.  He was sedated with 60 mcg of IV fentanyl  and 4 mg of IV Versed.  The Olympus video colonoscope was inserted into  the rectum and advanced to the cecum, confirmed by transillumination of  McBurney point and visualization of the ileocecal valve and appendiceal  orifice.  The prep was somewhat poor.  It was obscured by retained old  black, slightly maroon-tinged liquid with particulate stool as well,  limiting suction.  Otherwise, the cecum, ascending, and transverse colon  all appeared normal with no masses, polyps, diverticula, or other  mucosal abnormalities.  No obvious bleeding sources.  The cecum was  fairly well inspected.  No AVMs were seen.  Within the descending  sigmoid colon, there was scattered diverticula more numerous in the  sigmoid colon.  This area was particularly difficult to invasion in its  entirety due to the limited prep, but there was no bright red blood, no  stigma of hemorrhage seen in any visualized diverticulum.  Other than  the diverticula, the sigmoid and rectum appeared normal.  There were no  inflammatory changes.  No obvious source of bleeding and no bright red  or frankly maroon stool was seen, although there was a slight reddish  tinge to the dark liquid.  The scope was then withdrawn, and the patient  returned to the recovery room in stable condition.   He tolerated the  procedure well.  There were no immediate complications.   IMPRESSION:  Old blood throughout the colon.  No obvious source of  bleeding within the colon.   PLAN:  Advance diet and continue proton pump inhibitor empirically.  We  will monitor stools and hemoglobin and if continued evidence of  bleeding, we will consider capsule endoscopy.  Overall, I suspect source  of bleeding is proximal to the cecum.           ______________________________  Everardo All Madilyn Fireman, M.D.     JCH/MEDQ  D:  08/13/2008  T:  08/13/2008  Job:  [id number]

## 2011-03-24 NOTE — Op Note (Signed)
NAMEMAHIN, GUARDIA NO.:  [id number]   MEDICAL RECORD NO.:  [id number]          PATIENT TYPE:  INP   LOCATION:  2001                         FACILITY:  [facility]   PHYSICIAN:  Doylene Canning. Ladona Ridgel, MD         DATE OF BIRTH:   DATE OF PROCEDURE:  07/17/2008  DATE OF DISCHARGE:                               OPERATIVE REPORT   PRIMARY SURGEON:  Doylene Canning. Ladona Ridgel, MD.   FIRST ASSISTANT:  Hillis Range, MD   PREPROCEDURE DIAGNOSES:  1. Persistent atrial fibrillation.  2. Symptomatic bradycardia.  3. Diastolic dysfunction.   POSTPROCEDURE DIAGNOSES:  1. Persistent atrial fibrillation..  2. Symptomatic bradycardia.  3. Diastolic dysfunction.   PROCEDURES:  1. Dual chamber pacemaker implantation.  2. Left upper extremity venography.   DESCRIPTION OF THE PROCEDURE:  Informed written consent was obtained and  the patient was brought to the electrophysiology lab in the fasting  state.  He was adequately sedated with central venous Valium and  fentanyl as outlined in the nursing report.  The patient's left chest  was prepped and draped in the usual sterile fashion by the EP lab staff.  A 5-cm incision was made over the left deltopectoral region and a  pacemaker pocket was fashioned with a combination of sharp and blunt  dissection in a standard fashion.  Electrocautery was used to assure  hemostasis.  A venogram of the left upper extremity was then performed,  which reviewed a patent left axillary and left subclavian veins.  Using  a modified Seldinger technique, the left axillary vein was accessed.  Through this vein, a St. Jude medical OptiSense model [phone] (serial  [id number]) right atrial lead and a [employer]. Jude medical Tendril ST model  [id number] (serial [id number]) right ventricular lead were advanced into  the right atrial appendage and left ventricular apex positions  respectively.  Initial lead measurements revealed an atrial lead  impedance of 1050 ohms with flutter  waves between 1 and 1.5 millivolts.  The right ventricular lead R-wave measured 8.7 millivolts with an  impedance of 1252 ohms and threshold was 0.7 volts at 0.5 milliseconds.  The leads were then actively fixed to the myocardial wall.  The leads  were secured to the pectoralis fascia using 0 silk suture over the  suture sleeves.  The leads were then connected to a [employer]. Jude medical  Rippey model [id number]) dual chamber pacemaker.  The  pocket was then irrigated with copious gentamicin solution.  The device  was placed into the pocket and secured to the pectoralis fascia with #0  silk suture.  Hemostasis was again assured.  The pocket was then closed  with 2-0 Vicryl suture for the subcutaneous and subcuticular layers.  Steri-Strips and a sterile bandage were then applied.  There were no  early apparent complications.   CONCLUSIONS:  1. Successful dual chamber pacemaker implantation.  2. There were early apparent complications.      Hillis Range, MD   Electronically Signed     ______________________________  Doylene Canning. Ladona Ridgel, MD    JA/MEDQ  D:  07/17/2008  T:  07/18/2008  Job:  [id number]   cc:   Osvaldo Shipper. Espey, M.D.

## 2011-03-24 NOTE — Cardiovascular Report (Signed)
NAMETREVELL, PARISEAU NO.:  [id number]   MEDICAL RECORD NO.:  [id number]           PATIENT TYPE:   LOCATION:                                 FACILITY:   PHYSICIAN:  Madaline Savage, M.D.DATE OF BIRTH:  [birth year]   DATE OF PROCEDURE:  DATE OF DISCHARGE:                            CARDIAC CATHETERIZATION   PROCEDURES PERFORMED:  1. Combined left heart catheterization by Judkins technique.  2. Selective coronary angiography.  3. Left ventricular angiography with hemodynamic pressure monitoring.  4. Selective visualization of the left internal mammary artery.  5. No visualization of occluded saphenous vein grafts to intermediate      branch of circumflex and no visualization of the vein graft to the      circumflex which is 100% occluded at the aorto-ostial junction.   COMPLICATIONS:  None.   ENTRY SITE:  Right femoral.   DYE USED:  Omnipaque.  The patient required a long sheath because of  tortuosity in his right iliac artery.   PATIENT PROFILE:  Mr. Bartoli is an 77 year old gentleman who entered  [facility] on November 13, 2008, with chest pain that had started  the previous evening.  He has a history of coronary disease with  previous bypass surgery performed in 1989.  He also has sick sinus  syndrome with symptomatic bradycardia, tobacco use, hypertension.  Today  procedure was performed electively and without any complications.   PRESSURE RESULTS:  The left ventricular pressure was 195/7, end-  diastolic pressure 16.  Central aortic pressure 190/85, mean of 121.  No  significant aortic valve gradient by pullback technique.   ANGIOGRAPHIC RESULTS:  1. The patient's native coronary arteries showed a patent stent in his      mid proximal to mid RCA with good distal runoff.  2. The left main coronary artery was not diseased.  3. The left main coronary artery was 30% ostially narrowed and      distally narrowed.  There was a lot of overlap in  remainder of his      coronary arterial tree on the circumflex and LAD distribution.      There were stenoses of anywhere from 50%-75% in both vessels.   The left ventricular angiogram showed an ejection fraction estimated at  50%-60% with mild anterolateral wall hypokinesis and no evidence of LV  thrombus.  There was no mitral regurgitation seen.   There was noted to be 100% ostial occlusion.  The vein graft to the  intermediate ramus and a 100% occlusion of the saphenous vein graft to  the circumflex natural Y graft placed some years ago.   FINAL IMPRESSION:  1. Normal left ventricular systolic pressure.  2. Status post coronary artery bypass grafting with known ostial      occlusion of intermediate ramus and circumflex natural Y grafts      which were noted on a previous catheterization of about 3-4 years      ago and were again seen today.  3. The patient would appear to have patency of his internal mammary      artery  graft and patency of his RCA stent that was placed some      years ago.  He has occluded circulation involving the intermediate      ramus and circumflex vessels that can be treated medically and that      will be the recommendation.           ______________________________  Madaline Savage, M.D.     WHG/MEDQ  D:  11/15/2008  T:  11/16/2008  Job:  [id number]   cc:   Osvaldo Shipper. Brossard, M.D.  Eduardo Osier. Sharyn Lull, M.D.  [facility]

## 2011-03-24 NOTE — Procedures (Signed)
EEG NUMBER:  [id number].   CLINICAL HISTORY:  The patient is an 77 year old admitted with  hypotension, GI bleed, anemia and elevated prothrombin time 3.  He has  been noted to have total body tremoring with jerking movement and had a  4-minute episode where he became unresponsive.  Study is being done to  look for the presence of seizures (780.02, 333.2).   PROCEDURE:  The tracing was carried out on a 32-channel digital Cadwell  recorder reformatted into 16-channel montages with one devoted to EKG.  The patient was awake and alert during the recording.  The International  10/20 system lead placement was used.   MEDICATIONS:  Nitroglycerin, Versed, fentanyl, Protonix, Ambien,  Robitussin, Zofran, Xanax, and morphine sulfate.   DESCRIPTION OF FINDINGS:  Dominant frequency is 15 Hz, 35-40 microvolt  beta range activity.  This was present throughout the record.  Occasional theta range components were superimposed.  Significant muscle  artifacts were seen in the frontal regions.   The patient has series of jerking movements of his limbs that causes  muscle artifact and are not associated with any epileptic activity.  This happens repeatedly throughout the record.  The background does not  change immediately before or after the activity.   IMPRESSION:  Abnormal electroencephalogram on the basis of mild diffuse  low-voltage slowing which is a nonspecific finding.  The tremor is  quivering activity and may represent myoclonus due to the segmental.  There was no cortical disturbance associated with this activity.  No  seizure activity was seen in the record.  Electroencephalogram showed  regular sinus rhythm with ventricular response of 72 beats per minute.      Deanna Artis. Sharene Skeans, M.D.  Electronically Signed     ZOX:WRUE  D:  08/10/2008 14:40:22  T:  08/11/2008 45:40:98  Job #:  [id number]

## 2011-03-24 NOTE — Op Note (Signed)
NAMEHELAMAN, [name]:  [id number]   MEDICAL RECORD NO.:  [id number]          PATIENT TYPE:  INP   LOCATION:                               FACILITY:  [facility]   PHYSICIAN:  Petra Kuba, M.D.    DATE OF BIRTH:  06-04-1924   DATE OF PROCEDURE:  08/09/2008  DATE OF DISCHARGE:                               OPERATIVE REPORT   PROCEDURE:  EGD.   INDICATION:  GI bleeding, want to rule out an upper source.  Consent was  signed after risks, benefits, methods, and options were thoroughly  discussed multiple times in the past.   MEDICINES USED:  Fentanyl 50 mcg and Versed 5 mg.   PROCEDURE:  The video endoscope was inserted by direct vision.  The  esophagus was normal.  He did have a small hiatal hernia.  Scope passed  into the stomach.  No blood was seen.  There were few antral erosions  without any worrisome stigmata.  Scope passed easily through the normal  pylorus, into a normal duodenal bulb, around the C-loop to the normal  second portion of the duodenum.  No blood was seen distally.  The scope  was slowly withdrawn back to the bulb and a good look there ruled out  ulcers in that location.  Scope was withdrawn back to the stomach and  retroflexed.  High in the cardia, the hiatal hernia was confirmed.  The  fundus, angularis, lesser, and greater curve were normal on retroflex  visualization.  Straight visualization of the stomach did not reveal any  additional findings.  The scope was reinserted into the bulb and into  the C-loop and into the second portion of the duodenum, just a double  check and again no masses or lesions were seen.  The scope was withdrawn  back to the stomach.  Air was suctioned after it was one more time  evaluated on straight and retroflex visualization.  Scope was slowly  withdrawn again, and a good look of the esophagus was normal.  Scope was  removed.  The patient tolerated the procedure well.  There was no  obvious immediate  complication.   ENDOSCOPIC DIAGNOSES:  1. Small hiatal hernia.  2. Few antral erosions.  3. No signs of bleeding.  4. Otherwise within normal limits, the second part of the duodenum.   PLAN:  Observe for delayed complications or further bleeding.  If  further bleeding, consider either rechecking as colon or capsule  endoscopy.  Okay to resume Coumadin but will need to follow the levels  carefully.  Prefer Coumadin by itself or Plavix by itself but will yield  to Cardiology.  Certainly no more Aleve or nonsteroidals.  Cover long-  term with pump inhibitors.  We will follow with you.  Meanwhile, clear  liquids for now.           ______________________________  Petra Kuba, M.D.     MEM/MEDQ  D:  08/09/2008  T:  08/10/2008  Job:  [id number]   cc:   Osvaldo Shipper. Blye, M.D.  Eduardo Osier. Sharyn Lull, M.D.

## 2011-03-24 NOTE — Consult Note (Signed)
John Peck, CRICKENBERGER NO.:  [id number]   MEDICAL RECORD NO.:  [id number]          PATIENT TYPE:  INP   LOCATION:  4705                         FACILITY:  [facility]   PHYSICIAN:  Petra Kuba, M.D.    DATE OF BIRTH:  July 08, 1924   DATE OF CONSULTATION:  08/08/2008  DATE OF DISCHARGE:                                 CONSULTATION   HISTORY:  The patient seen at the request of Donia Guiles for GI  bleeding.  He is on an aspirin a day, is on Coumadin, and on some  nonsteroidals with some Aleve periodically.  I am not sure if he is on  any stomach medicine nor does he know.  Rarely does he have dysphagia.  He is on some iron, so his stools are dark but they seemed to be a  little darker and looser over the last few days.  They have been all  black.  He has not seen any bright red blood or clots.  He has had some  increased weakness and shortness of breath.  His labs are pending, but  he was admitted by Dr. Shana Chute for further workup and plan.   PAST MEDICAL HISTORY:  Pertinent for,  1. AFib, sinus bradycardia and a recent pacemaker placed.  2. History of liver abscess years ago.  3. Diverticular disease.  4. History of erosions from nonsteroidals in the past.  5. Hypertension.  6. Increased cholesterol.  7. Chronic anemia.  8. History of alcohol in the past but he has now quit.   MEDICINES AT HOME:  Unknown at this time.   ALLERGIES:  None.   FAMILY HISTORY:  Pertinent for colon cancer in a brother.   SOCIAL HISTORY:  Does use the Aleve, smokes about two packs a week but  not since his pacemaker was placed and did quit alcohol in 1981.   REVIEW OF SYSTEMS:  Negative except above.  His last colonoscopy and  endoscopy were in 2007.   PHYSICAL EXAMINATION:  GENERAL:  No acute distress, lying comfortable in  the bed.  ABDOMEN:  Soft, nontender.   LABORATORY DATA:  Pending.  He has guaiac positive black stools per Dr.  Shana Chute in the office.   ASSESSMENT:  1.  Multiple medical problems.  2. Gastrointestinal bleeding in a patient on Coumadin, aspirin and      Aleve.   PLAN:  We will await lab work, use Protonix, keep on ice chips only  p.r.n. signs of increased bleeding or when his PT is in a safe range  either tonight or tomorrow.  We will go ahead and proceed with  endoscopy.  The risks,  benefits, and methods of that were discussed.  We will do that first.  If that were normal, will probably need a repeat colonoscopy at some  point.   Thank you very much for the consult.  We will follow with you and await  labs as above.           ______________________________  Petra Kuba, M.D.     MEM/MEDQ  D:  08/08/2008  T:  08/09/2008  Job:  [id number]   cc:   Osvaldo Shipper. Harkin, M.D.

## 2011-03-24 NOTE — Discharge Summary (Signed)
NAMEJOURDIN, CONNORS              ACCOUNT NO.:  [id number]   MEDICAL RECORD NO.:  [id number]          PATIENT TYPE:  INP   LOCATION:  2001                         FACILITY:  [facility]   PHYSICIAN:  Osvaldo Shipper. Sedita, M.D.DATE OF BIRTH:  1924-02-27   DATE OF ADMISSION:  07/15/2008  DATE OF DISCHARGE:  07/18/2008                               DISCHARGE SUMMARY   DISCHARGE DIAGNOSES:  1. Atrial fibrillation.  2. Hypertension.  3. Tobacco use disorder.  4. Aortocoronary bypass.  5. Hyperlipidemia.   CONSULTANT:  Doylene Canning. Ladona Ridgel, MD   PROCEDURE:  Insertion of dual-chamber pacer, July 17, 2008.   Mr. Reicher is an 77 year old patient who presented to the emergency  department of [facility] on July 15, 2008, with a complaint  of shortness of breath and dizziness.  The patient has a history of  chronic paroxysmal atrial fibrillation, diabetes mellitus type 2,  hypertension, and tobacco abuse.  He has been having problems with  shortness of breath for over a month.  It has been difficult for him to  rest.  He had been staying in the office approximately a week ago at  which time he had some bradycardia.  He had chest x-rays and lab tests,  but continued to show some decline.  On the day of admission, he had  more problems with dizziness.  He had some substernal chest tightness  off and on.  He presented to the emergency department at which time he  was found to have a significant bradycardia of 30 beats per minute and  in an atrial fibrillation rhythm.  The patient was noted to have a  slight increase in his creatinine at 1.6.  His glucose was slightly  above the normal at 104, otherwise within normal.  His i-STAT chemistry  8 was within normal limits.  His chest x-ray showed cardiomegaly without  evidence of acute cardiopulmonary disease.  The was given IV atropine  0.5 with some improvement in his heart rate to 40-45.   HOSPITAL COURSE:  The patient was admitted to the  medical service and  was placed on telemetry and was started on Lovenox for DVT prophylaxis.  There was a question as to whether it was related to beta-blocker  adverse effect and he was taken off his Coreg that he had been on.  The  patient has a history of coronary artery disease, status post coronary  artery bypass graft surgeries.  He has been on beta-blockers for quite  some time and he maintained a good blood pressure, so therefore it was  considered that this was probably a conduction system disorder.  At this  point, a consultation was obtained with [facility] Electrophysiology team  for a possible pacemaker insertion.  The patient was seen by Dr. Ladona Ridgel  and it was indeed the opinion that the patient would need evaluation for  pacer due to persistent atrial fibrillation and symptomatic bradycardia.   On the July 17, 2008, a dual-chamber permanent pacer was implanted  via the left subclavian area with good results.   Plans were then made for the patient  to have Coumadin initiation.  The  patient was observed with good ventricular capture on his pacer and on  July 18, 2008, it was the opinion of the attending physician as well  as the consulting physician that the patient to be discharged home  having received maximum benefit from this hospitalization.   Medications at discharge include Plavix 5 mg daily Coumadin 5 mg daily,  carvedilol 25 mg twice daily, Lasix 20 mg daily, Imdur 30 mg daily,  Tekturna 150/12.5 daily.  The patient is to hold the omeprazole.  The  patient is to be seen in the office next week.  He is to be seen by Dr.  Ladona Ridgel on August 01, 2008, in the [facility] and also on October 23, 2008, at 10:10 a.m.  He is to notify the physician immediately if  any changes, problems, or concerns.      Ivery Quale, P.A.      Osvaldo Shipper. Bache, M.D.  Electronically Signed    HB/MEDQ  D:  08/21/2008  T:  08/22/2008  Job:  [id number]

## 2011-03-24 NOTE — Consult Note (Signed)
NAMEEVERT, WENRICH              ACCOUNT NO.:  [id number]   MEDICAL RECORD NO.:  [id number]          PATIENT TYPE:  INP   LOCATION:  4705                         FACILITY:  [facility]   PHYSICIAN:  Deanna Artis. Hickling, M.D.DATE OF BIRTH:  May 19, 1924   DATE OF CONSULTATION:  08/10/2008  DATE OF DISCHARGE:                                 CONSULTATION   CHIEF COMPLAINT:  Evaluate for seizures and shaking spells.   HISTORY OF PRESENT CONDITION:  John Peck is an 77 year old Afro  American gentleman who was diagnosed recently with atrial fibrillation  and was hospitalized at [facility].  He had slow response heart  rate and required placement of a pacemaker.   The patient has a past history of hypertension, dyslipidemia, previous  alcohol abuse, and has had paroxysmal atrial fibrillation.  He had  progressive shortness of breath and orthostatic dizziness.  After the  pacemaker was placed, the patient was placed on Coumadin.  He left the  hospital on or about July 18, 2008, with prothrombin time that was  16.7 and an INR of 1.3, we checked on July 15, 2008.  He made his  first Coumadin evaluation the next week and then forgot at the next  week.   The patient had developed some melenic stools.  He had had iron  deficiency anemia and was on iron, but these stools were not only tarry  but smelly.  He was admitted with a prothrombin time of 80.5 and an INR  of 8.6, hemoglobin 8.7.  He received transfusions, fresh frozen plasma,  and vitamin K.  His prothrombin time improved 7 hours later to 31, the  next day to 28.5, and today is 30.9.  He has had evaluation by GI who  failed to find evidence of a primary GI bleed.  He has had no more signs  of GI bleeding.  His diarrhea has improved, and his abdominal discomfort  has disappeared.   The patient has had intermittent chest pain but does not complain of any  at this time.  In the beginning yesterday, the patient had  myoclonic  jerks of his extremities.  Today, he had a formatted episode, during  which time the nurse at bedside was unable to arouse him.  He appeared  to be unresponsive.  She felt that she was witnessing a seizure.   I was asked by Dr. Shana Chute to see him to determine etiology of his  dysfunction, make recommendations for further workup and treatment.   Past medical history has been discussed in part above.   PAST SURGICAL HISTORY:  1. Coronary artery bypass graft in 1989, left inferior mammary to left      anterior descending.  2. The patient had a saphenous venous graft via left circumflex.  3. The patient has had stenting in September 2004.  4. Left eye cataract surgery in 2004.  5. Prostatectomy.  6. Hemorrhoidectomy.  7. Pacemaker implant in September 2009.   His current medications include Coreg, warfarin, Imdur, Plavix, aspirin,  lorazepam, uroxanthine, and Aleve.   DRUG ALLERGIES:  None known.   FAMILY  HISTORY:  Father died of cancer.  Mother's history is unknown.  He has 2 brothers who died of myocardial infarction.   SOCIAL HISTORY:  The patient is married.  He has 5 children.  He smokes  half a pack a day for 50+ years.  He used to drink alcohol but quit in  the 1980s.  He is a retired [employer].  His wife is at his side.   PHYSICAL EXAMINATION:  GENERAL:  On examination today, this pleasant  gentleman is in no acute distress who appears his stated age.  He is  right handed.  VITAL SIGNS:  Temperature 98.2, blood pressure 97/71, resting pulse 72,  respirations 16, and oxygen saturation 100%.  HEENT:  Ear, nose, and throat, no infections.  He has wax in his  external auditory canals.  NECK:  Supple neck, full range of motion, right carotid bruit.  LUNGS:  Clear.  He has a median sternotomy and also pacemaker in the  left upper chest.  HEART:  No murmurs.  Pulses normal.  Normal but irregularly irregular  rhythm.  ABDOMEN:  Soft.  Bowel sounds normal.  No  hepatosplenomegaly.  EXTREMITIES:  Normal.  He is assessed, except for his saphenous graft  sites.  NEUROLOGIC:  Awake and alert without dysphasia or dyspraxia.  He is  thinking somewhat slowly.  Cranial Nerves:  Round and reactive pupils.  He is status post left iridectomy.  I can see his left fundus but not  the right due to cataract in the right eye.  Visual fields are full to  double simultaneous stimuli.  Extraocular movements are full.  Symmetric  facial strength.  Midline tongue and uvula.  Air conduction greater than  bone conduction bilaterally.  Motor Examination:  Normal strength, tone,  and mass.  Fine motor movements were slow but normal.  No drift.  Myoclonic jerks were in his arms.  There is no loss of consciousness.  Sensory Examination:  Stocking neuropathy.  Good stereognosis.  Cerebellar Examination:  Good finger-to-nose with rapid alternating  movements.  Gait, slightly broad based, negative Gower.  He gets up on  his heels and toes well.  Deep tendon reflexes are absent.  He had  bilateral flexor plantar responses.   IMPRESSION:  1. 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  2. I cannot rule out the presence of a generalized seizure.  It seems      unlikely that he would have had a cardiac arrhythmia.  He is on      telemetry and none was seen (780.39).  3. He has no focal neurologic deficits, hence I do not think that he      has had a bleed into his head.  4. Atrial fibrillation.  5. Warfarin toxicity.   PLAN:  1. CT scan of the brain.  Because of his pacemaker, we cannot perform      an MRI scan.  2. EEG, both of these studies need to be done today.  I may place him      on Keppra which is not metabolized by the intestine and is      eliminated by the kidney.  This could be a very effective      medication for myoclonus, as well as generalized seizures.  I      appreciate the opportunity to participate in his care.  I have      reviewed his laboratory  studies.  He has normal liver functions and  albumin that is low at 2.9.  The reason for his markedly elevated      prothrombin time is unclear; however, he did miss one of his      scheduled evaluations for prothrombin time.      Deanna Artis. Sharene Skeans, M.D.  Electronically Signed    WHH/MEDQ  D:  08/10/2008  T:  08/11/2008  Job:  [id number]

## 2011-03-27 NOTE — Discharge Summary (Signed)
John Peck, John Peck              ACCOUNT NO.:  [id number]   MEDICAL RECORD NO.:  [id number]          Peck TYPE:  INP   LOCATION:  4735                         FACILITY:  [facility]   PHYSICIAN:  Osvaldo Shipper. Shumard, M.D.DATE OF BIRTH:  03/21/1924   DATE OF ADMISSION:  11/12/2008  DATE OF DISCHARGE:  11/16/2008                               DISCHARGE SUMMARY   DISCHARGE DIAGNOSES:  1. Coronary atherosclerosis of John native coronary vessel.  2. Intermediate coronary syndrome.  3. Cardiac pacemaker.  4. Coronary atherosclerosis of John vein bypassed graft.  5. Hypertension.   CONSULTATIONS:  Madaline Savage, MD   PROCEDURE:  Cardiac catheterization on November 15, 2008.   HISTORY OF PRESENT ILLNESS:  John Peck is an 77 year old Peck who  presented initially to John emergency department at [facility] by a private vehicle with complaint of chest pain.   It is of note that John Peck has a history of a pacemaker use, has  coronary artery bypass graft surgery, hypertension, and coronary artery  disease.  He developed some worsening of his chest pain.  It is of note  that he has a history of atrial fibrillation as well as a  bradydysrhythmia.  He reports a change in his chest pain and  subsequently he was admitted to rule out possibility of myocardial  infarction.   John Peck was admitted to John medical service and was placed on  telemetry.  On November 13, 2008, he had one bout of chest pain that  resolved.   John Peck underwent a nuclear medicine myocardial perfusion imaging  study and it was noted to be a negative for a pharmacological stress  induced ischemia.  John left ventricular ejection fraction was 39%,  decreased from John previous study at which time John left ventricular  ejection fraction was 70%.  There was an anterior apical scar noted as  previously noted, but new hypokinesis involving John apex, anterior wall  John left ventricle and septum.  At this  point, John Peck was seen on  consultation by Cardiology.  After additional evaluation from  Cardiology, it was John opinion that John Peck had chest pain  consistent with exertional angina and also had an abnormal Myoview after  their review and it was therefore John opinion that John Peck should  proceed with cardiac catheterization.  This was discussed with John  Peck and family as well as with Dr. Shana Chute and plans were made to  proceed with this.   John Peck underwent cardiac catheterization by Dr. Elsie Lincoln on November 15, 2008.  It was noted to be a technically difficult case due to  ascending and transverse aortic coiling.  However, John left internal  mammary to John LAD was patent with good runoff.  There was 100%  occlusion of John saphenous vein graft.  There was an intermediate ramus  artery.  John saphenous vein graft to John circumflex was evaluated  without major problem.  John LV angiogram showed John ejection fraction to  be 50-60%, John left main had a 30% proximal lesion, and John right  coronary artery had a patent stent to John proximal RCA.  It was  therefore John recommendation to proceed medically with John occlusion of  John saphenous vein grafts noted above.   On November 16, 2008, plans were made for John Peck to be discharged  home.   DISCHARGE INSTRUCTIONS:  John Peck is to be seen in John office on  December 01, 2008 or sooner if any changes, problems, or concerns.   DISCHARAGE MEDICATIONS:  1. Plavix 75 mg daily.  2. Isosorbide ER one tablet daily.  3. Furosemide 20 mg daily.  4. Carvedilol 25 mg twice a day.  5. Temazepam 30 mg at bedtime as needed.  6. Lorazepam 1 mg 3 times a day.  7. Aspirin 81 mg daily.  8. Iron daily.  9. Levetiracetam one 4 times a day.  10.Uroxatral 10 mg one daily.   John Peck is to notify John physician immediately if any changes,  problems, or concerns.      Ivery Quale, P.A.      Osvaldo Shipper. Chai, M.D.   Electronically Signed    HB/MEDQ  D:  12/27/2008  T:  12/28/2008  Job:  [id number]

## 2011-03-30 ENCOUNTER — Other Ambulatory Visit (HOSPITAL_COMMUNITY): Payer: Self-pay | Admitting: Oncology

## 2011-03-30 LAB — FECAL OCCULT BLOOD, GUAIAC: Occult Blood: POSITIVE

## 2011-04-07 ENCOUNTER — Other Ambulatory Visit (HOSPITAL_COMMUNITY): Payer: Self-pay | Admitting: Oncology

## 2011-04-07 ENCOUNTER — Encounter (HOSPITAL_BASED_OUTPATIENT_CLINIC_OR_DEPARTMENT_OTHER): Payer: Medicare Other | Admitting: Oncology

## 2011-04-07 DIAGNOSIS — D509 Iron deficiency anemia, unspecified: Secondary | ICD-10-CM

## 2011-04-07 LAB — CBC WITH DIFFERENTIAL/PLATELET
BASO%: 0.3 % (ref 0.0–2.0)
Basophils Absolute: 0 10*3/uL (ref 0.0–0.1)
EOS%: 3.7 % (ref 0.0–7.0)
MCH: 30.9 pg (ref 27.2–33.4)
MCHC: 33.5 g/dL (ref 32.0–36.0)
MCV: 92.2 fL (ref 79.3–98.0)
MONO%: 13.2 % (ref 0.0–14.0)
RBC: 3.98 10*6/uL — ABNORMAL LOW (ref 4.20–5.82)
RDW: 14 % (ref 11.0–14.6)
lymph#: 1.1 10*3/uL (ref 0.9–3.3)

## 2011-04-22 ENCOUNTER — Other Ambulatory Visit: Payer: Self-pay | Admitting: Gastroenterology

## 2011-05-19 ENCOUNTER — Other Ambulatory Visit (HOSPITAL_COMMUNITY): Payer: Self-pay | Admitting: Oncology

## 2011-05-19 ENCOUNTER — Encounter (HOSPITAL_BASED_OUTPATIENT_CLINIC_OR_DEPARTMENT_OTHER): Payer: Medicare Other | Admitting: Oncology

## 2011-05-19 DIAGNOSIS — D509 Iron deficiency anemia, unspecified: Secondary | ICD-10-CM

## 2011-05-19 LAB — CBC WITH DIFFERENTIAL/PLATELET
BASO%: 0.5 % (ref 0.0–2.0)
LYMPH%: 34.3 % (ref 14.0–49.0)
MCHC: 33.7 g/dL (ref 32.0–36.0)
MONO#: 0.4 10*3/uL (ref 0.1–0.9)
MONO%: 10 % (ref 0.0–14.0)
Platelets: 144 10*3/uL (ref 140–400)
RBC: 3.78 10*6/uL — ABNORMAL LOW (ref 4.20–5.82)
RDW: 14 % (ref 11.0–14.6)
WBC: 4.1 10*3/uL (ref 4.0–10.3)
nRBC: 0 % (ref 0–0)

## 2011-05-19 LAB — COMPREHENSIVE METABOLIC PANEL
ALT: 7 U/L (ref 0–53)
AST: 12 U/L (ref 0–37)
Chloride: 104 mEq/L (ref 96–112)
Creatinine, Ser: 1.21 mg/dL (ref 0.50–1.35)
Sodium: 138 mEq/L (ref 135–145)
Total Bilirubin: 0.4 mg/dL (ref 0.3–1.2)

## 2011-05-19 LAB — IRON AND TIBC
TIBC: 253 ug/dL (ref 215–435)
UIBC: 192 ug/dL

## 2011-05-28 ENCOUNTER — Inpatient Hospital Stay (HOSPITAL_COMMUNITY)
Admission: EM | Admit: 2011-05-28 | Discharge: 2011-06-02 | DRG: 195 | Disposition: A | Discharge status: Skilled Nursing Facility | Payer: Medicare Other | Attending: Internal Medicine | Admitting: Internal Medicine

## 2011-05-28 ENCOUNTER — Emergency Department (HOSPITAL_COMMUNITY): Payer: Medicare Other

## 2011-05-28 DIAGNOSIS — R42 Dizziness and giddiness: Secondary | ICD-10-CM | POA: Diagnosis present

## 2011-05-28 DIAGNOSIS — E860 Dehydration: Secondary | ICD-10-CM | POA: Diagnosis present

## 2011-05-28 DIAGNOSIS — I4891 Unspecified atrial fibrillation: Secondary | ICD-10-CM | POA: Diagnosis present

## 2011-05-28 DIAGNOSIS — R29898 Other symptoms and signs involving the musculoskeletal system: Secondary | ICD-10-CM | POA: Diagnosis present

## 2011-05-28 DIAGNOSIS — D696 Thrombocytopenia, unspecified: Secondary | ICD-10-CM | POA: Diagnosis present

## 2011-05-28 DIAGNOSIS — F17200 Nicotine dependence, unspecified, uncomplicated: Secondary | ICD-10-CM | POA: Diagnosis present

## 2011-05-28 DIAGNOSIS — R5381 Other malaise: Secondary | ICD-10-CM | POA: Diagnosis present

## 2011-05-28 DIAGNOSIS — I10 Essential (primary) hypertension: Secondary | ICD-10-CM | POA: Diagnosis present

## 2011-05-28 DIAGNOSIS — R5383 Other fatigue: Secondary | ICD-10-CM | POA: Diagnosis present

## 2011-05-28 DIAGNOSIS — K5900 Constipation, unspecified: Secondary | ICD-10-CM | POA: Diagnosis present

## 2011-05-28 DIAGNOSIS — E785 Hyperlipidemia, unspecified: Secondary | ICD-10-CM | POA: Diagnosis present

## 2011-05-28 DIAGNOSIS — I2510 Atherosclerotic heart disease of native coronary artery without angina pectoris: Secondary | ICD-10-CM | POA: Diagnosis present

## 2011-05-28 DIAGNOSIS — J189 Pneumonia, unspecified organism: Principal | ICD-10-CM | POA: Diagnosis present

## 2011-05-28 DIAGNOSIS — Z951 Presence of aortocoronary bypass graft: Secondary | ICD-10-CM

## 2011-05-28 LAB — URINALYSIS, ROUTINE W REFLEX MICROSCOPIC
Bilirubin Urine: NEGATIVE
Glucose, UA: NEGATIVE mg/dL
Ketones, ur: NEGATIVE mg/dL
pH: 6.5 (ref 5.0–8.0)

## 2011-05-28 LAB — CBC
Hemoglobin: 12.5 g/dL — ABNORMAL LOW (ref 13.0–17.0)
MCH: 31.9 pg (ref 26.0–34.0)
MCV: 94.6 fL (ref 78.0–100.0)
RBC: 3.92 MIL/uL — ABNORMAL LOW (ref 4.22–5.81)

## 2011-05-28 LAB — BASIC METABOLIC PANEL
BUN: 15 mg/dL (ref 6–23)
CO2: 27 mEq/L (ref 19–32)
Chloride: 102 mEq/L (ref 96–112)
Creatinine, Ser: 1.02 mg/dL (ref 0.50–1.35)

## 2011-05-28 LAB — LIPASE, BLOOD: Lipase: 17 U/L (ref 11–59)

## 2011-05-28 LAB — CARDIAC PANEL(CRET KIN+CKTOT+MB+TROPI)
Relative Index: INVALID (ref 0.0–2.5)
Total CK: 68 U/L (ref 7–232)

## 2011-05-28 LAB — DIFFERENTIAL
Lymphs Abs: 1 10*3/uL (ref 0.7–4.0)
Monocytes Relative: 13 % — ABNORMAL HIGH (ref 3–12)
Neutro Abs: 2.5 10*3/uL (ref 1.7–7.7)
Neutrophils Relative %: 61 % (ref 43–77)

## 2011-05-29 LAB — COMPREHENSIVE METABOLIC PANEL
Albumin: 3.2 g/dL — ABNORMAL LOW (ref 3.5–5.2)
BUN: 13 mg/dL (ref 6–23)
Chloride: 105 mEq/L (ref 96–112)
Creatinine, Ser: 1.07 mg/dL (ref 0.50–1.35)
GFR calc Af Amer: >60 mL/min (ref >60)
GFR calc non Af Amer: >60 mL/min (ref >60)
Glucose, Bld: 169 mg/dL — ABNORMAL HIGH (ref 70–99)
Total Bilirubin: 0.4 mg/dL (ref 0.3–1.2)

## 2011-05-29 LAB — DIFFERENTIAL
Basophils Absolute: 0 10*3/uL (ref 0.0–0.1)
Basophils Relative: 0 % (ref 0–1)
Eosinophils Absolute: 0.2 10*3/uL (ref 0.0–0.7)
Lymphs Abs: 1.2 10*3/uL (ref 0.7–4.0)
Neutrophils Relative %: 68 % (ref 43–77)

## 2011-05-29 LAB — CARDIAC PANEL(CRET KIN+CKTOT+MB+TROPI)
CK, MB: 1.5 ng/mL (ref 0.3–4.0)
CK, MB: 1.8 ng/mL (ref 0.3–4.0)
Relative Index: INVALID (ref 0.0–2.5)
Total CK: 69 U/L (ref 7–232)
Troponin I: <0.3 ng/mL (ref <0.30)
Troponin I: <0.3 ng/mL (ref <0.30)

## 2011-05-29 LAB — CBC
Platelets: 139 10*3/uL — ABNORMAL LOW (ref 150–400)
RBC: 3.76 MIL/uL — ABNORMAL LOW (ref 4.22–5.81)
RDW: 14.1 % (ref 11.5–15.5)
WBC: 5.7 10*3/uL (ref 4.0–10.5)

## 2011-05-29 LAB — VITAMIN B12: Vitamin B-12: 306 pg/mL (ref 211–911)

## 2011-05-29 LAB — MAGNESIUM: Magnesium: 2 mg/dL (ref 1.5–2.5)

## 2011-05-30 LAB — BASIC METABOLIC PANEL
BUN: 12 mg/dL (ref 6–23)
CO2: 25 mEq/L (ref 19–32)
Chloride: 101 mEq/L (ref 96–112)
Creatinine, Ser: 1.1 mg/dL (ref 0.50–1.35)
GFR calc Af Amer: >60 mL/min (ref >60)
Potassium: 3.6 mEq/L (ref 3.5–5.1)

## 2011-05-30 LAB — CARDIAC PANEL(CRET KIN+CKTOT+MB+TROPI)
CK, MB: 1.9 ng/mL (ref 0.3–4.0)
CK, MB: 1.9 ng/mL (ref 0.3–4.0)
Relative Index: INVALID (ref 0.0–2.5)
Total CK: 63 U/L (ref 7–232)
Total CK: 63 U/L (ref 7–232)

## 2011-05-30 LAB — CBC
HCT: 34.6 % — ABNORMAL LOW (ref 39.0–52.0)
MCV: 92.5 fL (ref 78.0–100.0)
RBC: 3.74 MIL/uL — ABNORMAL LOW (ref 4.22–5.81)
WBC: 4.5 10*3/uL (ref 4.0–10.5)

## 2011-05-31 LAB — CBC
HCT: 34.1 % — ABNORMAL LOW (ref 39.0–52.0)
MCHC: 33.1 g/dL (ref 30.0–36.0)
MCV: 92.7 fL (ref 78.0–100.0)
Platelets: 139 10*3/uL — ABNORMAL LOW (ref 150–400)
RDW: 14 % (ref 11.5–15.5)
WBC: 5.1 10*3/uL (ref 4.0–10.5)

## 2011-05-31 LAB — BASIC METABOLIC PANEL
BUN: 14 mg/dL (ref 6–23)
Creatinine, Ser: 1.18 mg/dL (ref 0.50–1.35)
GFR calc Af Amer: >60 mL/min (ref >60)
GFR calc non Af Amer: 59 mL/min — ABNORMAL LOW (ref >60)
Glucose, Bld: 82 mg/dL (ref 70–99)

## 2011-05-31 LAB — CARDIAC PANEL(CRET KIN+CKTOT+MB+TROPI)
CK, MB: 1.7 ng/mL (ref 0.3–4.0)
Troponin I: <0.3 ng/mL (ref <0.30)

## 2011-06-01 LAB — CBC
HCT: 35.2 % — ABNORMAL LOW (ref 39.0–52.0)
Hemoglobin: 11.6 g/dL — ABNORMAL LOW (ref 13.0–17.0)
MCH: 30.8 pg (ref 26.0–34.0)
MCHC: 33 g/dL (ref 30.0–36.0)
RDW: 13.9 % (ref 11.5–15.5)

## 2011-06-01 LAB — BASIC METABOLIC PANEL
BUN: 21 mg/dL (ref 6–23)
Calcium: 9 mg/dL (ref 8.4–10.5)
GFR calc Af Amer: >60 mL/min (ref >60)
GFR calc non Af Amer: 60 mL/min — ABNORMAL LOW (ref >60)
Glucose, Bld: 78 mg/dL (ref 70–99)
Potassium: 4.2 mEq/L (ref 3.5–5.1)
Sodium: 136 mEq/L (ref 135–145)

## 2011-06-04 LAB — CULTURE, BLOOD (ROUTINE X 2)
Culture  Setup Time: 201207200454
Culture  Setup Time: 201207200454
Culture: NO GROWTH

## 2011-06-06 NOTE — Discharge Summary (Signed)
[name], [name]:  [id number]  MEDICAL RECORD NO.:  [id number]  LOCATION:  1433                         FACILITY:  [facility]  PHYSICIAN:  Hartley Barefoot, MD    DATE OF BIRTH:  Oct 17, 1924  DATE OF ADMISSION:  05/28/2011 DATE OF DISCHARGE:  06/02/2011                        DISCHARGE SUMMARY - REFERRING   DISCHARGE DIAGNOSES: 1. Community-acquired pneumonia. 2. Dizziness, generalized weakness secondary to dehydration and     infection. 3. Coronary artery disease with history of occasional chest pain,     resolved with nitroglycerin. 4. Dehydration. 5. Hypertension. 6. Chronic lower extremity weakness.  OTHER DIAGNOSES: 1. History of atrial fibrillation. 2. History of hyperlipidemia. 3. Prior history of coronary artery disease, status post coronary     artery bypass graft in 1989. 4. Prior history of accelerated angina, status post percutaneous     transluminal coronary angiography and stenting to mid-right     coronary artery in 2004  PAST SURGICAL HISTORY: 1. History of CABG in 1989. 2. History of cataract in 2004. 3. History of prostatectomy. 4. History of hemorrhoidectomy.  X-RAYS: 1. CT head on May 28, 2011, showed no acute intracranial abnormality. 2. Chest x-ray on July 19th showed patchy left lower lobe infiltrate     increased since previous study, stable postoperative change. 3. 2-D echo on July 21st, showed ejection fraction 50% to 55%.     Regional wall motion abnormality.  Hypokinesis of the apical     myocardium.  Doppler parameters are consistent with abnormal left     ventricular relaxation, grade diastolic dysfunction type 1.  DISPOSITION AND FOLLOWUP:  John Peck will need to follow with his Cardiology, Dr. Nicki Guadalajara, for further evaluation of his coronary artery disease.  Also, MMA level will need to be follow up.  The patient might require B12 supplement.  BRIEF HISTORY OF PRESENT ILLNESS:  This is a very pleasant  77 year old African American male who presents to the emergency department complaining of weakness and dizziness.  He has had weakness on his leg for the last 6 months or more, but the day of admission it got acutely worse.  He went to stand up after eating his lunch, he fell and then that afternoon he was unable to walk due to his weakness.  He was also complaining of some dizziness at that moment.  HOSPITAL COURSE: 1. Left lower lobe pneumonia.  The patient was admitted to the     telemetry bed.  He was started on ceftriaxone and Azithro.  He     received 3 days of IV antibiotics.  Blood cultures x2 negative.     Chest x-ray showed possible acute versus chronic infiltrates.  The     patient was treated for presumed pneumonia. 2. Dizziness, generalized weakness.  The patient presented with     dizziness and generalized weakness.  I think that this could be     related to infections, versus a component of dehydration. The     patient was able to work with physical therapies.  He was able to     ambulate with help of the walker.  His dizziness has resolved at     this  time with treatment of infection and resolution of     dehydration. 3. Chronic lower extremity weakness.  A CT head was negative.  B12 was     low normal at 306.  TSH was normal at 3.3. I ordered an MMA level.      He will benefit depending on MMA level of B12 supplement. 4. Coronary artery disease, history of CABG.  The patient had 1     episode of chest pain during hospitalization, no EKG changes.     Cardiac enzymes x3 negative.  He had a 2-D echo that showed     ejection fraction 55%.  Showed some mild hypokinesis on the apex.     I reviewed the 2-D echo with Dr. Nicki Guadalajara. This finding      is may be  chronic.  He recommended the patient follow     with him as an outpatient.  The patient was chest pain free. The     patient was continued on Ranexa 500 mg p.o. b.i.d. 5. Hypertension.  The patient's Norvasc dose was  changed to 5 mg p.o.     daily to avoid hypotension. 6. Systolic and diastolic heart failure, stable during this     hospitalization.  We continue with Lasix, Coreg, aspirin. 7. Constipation.  The patient was started on MiraLax daily p.o.,     docusate.  If he does not have a bowel movement, he might need an     enema.  On the day of discharge, the patient was in a stable condition.  Blood pressure goes from 145/70 to 99/54, sat 96% on room air, respirations 19, pulse 65, temperature 98.1, sodium 136, potassium 4.2, chloride 102, bicarbonate 25, glucose 78, BUN 21, creatinine 1.1, white blood cell 6.2, hemoglobin 11.6, hematocrit 35.2, platelet 151.     Hartley Barefoot, MD     BR/MEDQ  D:  06/01/2011  T:  06/01/2011  Job:  [id number]  Electronically Signed by Hartley Barefoot MD on 06/06/2011 09:47:38 AM

## 2011-06-06 NOTE — Discharge Summary (Signed)
  [name], [name]:  [id number]  MEDICAL RECORD NO.:  [id number]  LOCATION:  1433                         FACILITY:  [facility]  PHYSICIAN:  Hartley Barefoot, MD    DATE OF BIRTH:  [birth year]  DATE OF ADMISSION:  05/28/2011 DATE OF DISCHARGE:                        DISCHARGE SUMMARY - REFERRING   ADDENDUM:  DISCHARGE MEDICATIONS: 1. Norvasc 5 mg p.o. daily. 2. Docusate 100 mg p.o. b.i.d. 3. Guaifenesin 1200 mg p.o. b.i.d. 4. Hydrocodone 5/325 p.o. q.8 h. as needed. 5. Moxifloxacin 400 mg p.o. daily. 6. MiraLax p.o. b.i.d. 7. Ranolazine 500 mg b.i.d. 8. Flomax 0.4 mg p.o. daily at bedtime. 9. Aspirin 81 mg p.o. daily. 10.Carvedilol 25 mg p.o. b.i.d. 11.Crestor 20 mg p.o. daily. 12.Furosemide 20 mg p.o. daily. 13.Isosorbide mononitrate 60 mg 2 tablets by mouth daily. 14.Protonix 40 mg p.o. b.i.d. 15.Plavix 75 mg p.o. daily. 16.Temazepam 30 mg p.o. daily at bedtime. 17.Alfuzosin 10 mg p.o. daily.  The patient will be discharged today to SNF.  Consider a Fleet Enema if the patient does not have a bowel movement.  He was not having abdominal pain.  He was tolerating diet.  He was passing gas.  Abdomen exam was benign.  The patient's blood pressure is 119/73, saturations 92% on room air, respirations 14, pulse 67, temperature 98.  The patient was in good condition.     Hartley Barefoot, MD     BR/MEDQ  D:  06/02/2011  T:  06/02/2011  Job:  [id number]  Electronically Signed by Hartley Barefoot MD on 06/06/2011 09:47:59 AM

## 2011-06-19 ENCOUNTER — Encounter: Payer: Self-pay | Admitting: Internal Medicine

## 2011-06-19 DIAGNOSIS — I4891 Unspecified atrial fibrillation: Secondary | ICD-10-CM

## 2011-07-20 ENCOUNTER — Encounter (HOSPITAL_BASED_OUTPATIENT_CLINIC_OR_DEPARTMENT_OTHER): Payer: Medicare Other | Admitting: Oncology

## 2011-07-20 ENCOUNTER — Other Ambulatory Visit (HOSPITAL_COMMUNITY): Payer: Self-pay | Admitting: Oncology

## 2011-07-20 DIAGNOSIS — D509 Iron deficiency anemia, unspecified: Secondary | ICD-10-CM

## 2011-07-20 LAB — CBC WITH DIFFERENTIAL/PLATELET
Eosinophils Absolute: 0.2 10*3/uL (ref 0.0–0.5)
HCT: 36.4 % — ABNORMAL LOW (ref 38.4–49.9)
LYMPH%: 30.5 % (ref 14.0–49.0)
MCHC: 33.5 g/dL (ref 32.0–36.0)
MCV: 93.6 fL (ref 79.3–98.0)
MONO#: 0.5 10*3/uL (ref 0.1–0.9)
NEUT#: 2 10*3/uL (ref 1.5–6.5)
NEUT%: 51.3 % (ref 39.0–75.0)
Platelets: 152 10*3/uL (ref 140–400)
WBC: 3.8 10*3/uL — ABNORMAL LOW (ref 4.0–10.3)

## 2011-07-20 LAB — COMPREHENSIVE METABOLIC PANEL
ALT: 10 U/L (ref 0–53)
AST: 13 U/L (ref 0–37)
Creatinine, Ser: 1.18 mg/dL (ref 0.50–1.35)
Total Bilirubin: 0.7 mg/dL (ref 0.3–1.2)

## 2011-07-20 LAB — FERRITIN: Ferritin: 59 ng/mL (ref 22–322)

## 2011-07-20 LAB — IRON AND TIBC: %SAT: 35 % (ref 20–55)

## 2011-07-20 LAB — LACTATE DEHYDROGENASE: LDH: 134 U/L (ref 94–250)

## 2011-07-28 NOTE — H&P (Signed)
NAMEORBIE, GRUPE              ACCOUNT NO.:  [id number]  MEDICAL RECORD NO.:  [id number]  LOCATION:  WLED                         FACILITY:  [facility]  PHYSICIAN:  Madisun Hargrove, DO         DATE OF BIRTH:  10-05-1924  DATE OF ADMISSION:  05/28/2011 DATE OF DISCHARGE:                             HISTORY & PHYSICAL   CHIEF COMPLAINT:  Weakness.  HISTORY OF PRESENT ILLNESS:  The patient is an 77 year old male who states that he has had weakness in his legs for 6 months or more but today things were acutely worse and when he went to go stand up after eating his lunch, he fell and then this afternoon he has been unable to walk due to his weakness.  PAST MEDICAL HISTORY:  Significant for coronary artery disease status post pacer, hypertension, anxiety and left eye cataract.  PAST SURGICAL HISTORY:  Significant for CABG, prostatectomy, hemorrhoidectomy.  MEDICATIONS:  At home include and I do not have dosages and frequencies on these medications but I do have a list which includes iron, Ecotrin, Crestor, Plavix, Ativan, pantoprazole, tamsulosin, Lasix, Ranexa, amlodipine, isosorbide, carvedilol.  ALLERGIES:  NKDA.  SOCIAL HISTORY:  No alcohol.  No recreational drug use.  The patient has roughly an 80-pack year history and continues to smoke.  FAMILY HISTORY:  Father died of cancer.  Brother had MI x2.  REVIEW OF SYSTEMS:  CONSTITUTIONAL:  Negative for fever, negative for chills.  Positive for weakness, positive for fatigue.  CNS:  No headaches, no seizures, no limb weakness.  ENT:  No nasal congestion, no throat pain, no coryza.  CARDIOVASCULAR:  Occasional chest pain which is relieved with nitroglycerin.  No palpitations.  No orthopnea. RESPIRATORY:  No cough.  Positive for some shortness of breath. Negative for wheeze.  GASTROINTESTINAL:  No nausea, no vomiting. Positive for chronic constipation.  Negative for diarrhea. GENITOURINARY:  No dysuria, no hematuria, no urinary  frequency.  RENAL: No flank pain, no swelling, no pruritus.  SKIN:  No masses, no sores, no lesions.  HEMATOLOGICAL:  No easy bruising, no purpura, no clots. LYMPHS:  No lymphadenopathy.  No painful nodes, no specific lymph swelling.  PSYCHIATRIC:  Positive for anxiety.  No depression.  No insomnia.  PHYSICAL EXAMINATION:  VITAL SIGNS:  Temperature 97.5, pulse 51, respiratory rate 18, blood pressure 171/74.  LABORATORY DATA:  WBC is 4.2, hemoglobin 12.5, hematocrit 37.1, platelets are 145.  Sodium 136, potassium 4.3, chloride 102, CO2 of 27, BUN 15, creatinine 1.02, calcium 9.0 and lipase 17.  CK 68, MB is 1.5, troponin I is less than 0.30.  Urinalysis is negative.  RADIOLOGY:  CT head shows no acute intracranial abnormality.  Chest x- ray shows patchy left lower lobe infiltrates and stable postop changes. EKG is abnormal, demonstrates an ectopic atrial rhythm and left bundle- branch block.  There are no old EKGs in this chart to compare it to.  ASSESSMENT: 1. Left lower lobe pneumonia. 2. Generalized weakness secondary to left lower lobe pneumonia,     although the patient clearly has some chronic debility and may     require PT, OT and/or SNF at discharge. 3. Coronary artery  disease with occasional chest pain that is amenable     to nitroglycerin. 4. Dehydration. 5. Hypertension. 6. Tobacco abuse.  PLAN: 1. IV antibiotics. 2. IV fluids. 3. Continue home meds. 4. Nicotine patch.  The patient's primary care doctor is Dr. Mirna Mires.  I spent 40 minutes on this admission.          ______________________________ Fran Lowes, DO     AS/MEDQ  D:  05/28/2011  T:  05/28/2011  Job:  [id number]  cc:   Annia Friendly. Loleta Chance, MD Fax: [phone]  Electronically Signed by Fran Lowes DO on 07/28/2011 01:55:22 PM

## 2011-08-10 LAB — BASIC METABOLIC PANEL
BUN: 11
BUN: 23
BUN: 37 — ABNORMAL HIGH
BUN: 44 — ABNORMAL HIGH
CO2: 25
CO2: 25
CO2: 26
CO2: 28
Calcium: 8.1 — ABNORMAL LOW
Calcium: 8.3 — ABNORMAL LOW
Calcium: 8.4
Calcium: 8.4
Calcium: 8.8
Chloride: 107
Chloride: 112
Chloride: 113 — ABNORMAL HIGH
Creatinine, Ser: 0.93
Creatinine, Ser: 0.94
Creatinine, Ser: 0.96
Creatinine, Ser: 0.97
Creatinine, Ser: 1.05
GFR calc Af Amer: >60
GFR calc Af Amer: >60
GFR calc non Af Amer: >60
GFR calc non Af Amer: >60
Glucose, Bld: 102 — ABNORMAL HIGH
Glucose, Bld: 103 — ABNORMAL HIGH
Glucose, Bld: 109 — ABNORMAL HIGH
Glucose, Bld: 113 — ABNORMAL HIGH
Glucose, Bld: 87
Glucose, Bld: 94
Potassium: 3.9
Potassium: 4
Sodium: 141
Sodium: 142
Sodium: 143

## 2011-08-10 LAB — CROSSMATCH

## 2011-08-10 LAB — URINALYSIS, MICROSCOPIC ONLY
Bilirubin Urine: NEGATIVE
Glucose, UA: NEGATIVE
Leukocytes, UA: NEGATIVE
Nitrite: NEGATIVE
Specific Gravity, Urine: 1.022
pH: 5.5

## 2011-08-10 LAB — CBC
HCT: 25.4 — ABNORMAL LOW
HCT: 21.8 — ABNORMAL LOW
HCT: 25 — ABNORMAL LOW
HCT: 26 — ABNORMAL LOW
HCT: 27.1 — ABNORMAL LOW
HCT: 29.4 — ABNORMAL LOW
Hemoglobin: 8.7 — ABNORMAL LOW
Hemoglobin: 6.9 — CL
Hemoglobin: 8.8 — ABNORMAL LOW
Hemoglobin: 9.9 — ABNORMAL LOW
MCHC: 34.3
MCHC: 33.6
MCHC: 33.6
MCHC: 34
MCHC: 34.1
MCHC: 34.3
MCHC: 34.3
MCHC: 34.5
MCV: 90.5
MCV: 90.7
MCV: 91
MCV: 91.1
MCV: 91.7
Platelets: 103 — ABNORMAL LOW
Platelets: 120 — ABNORMAL LOW
Platelets: 141 — ABNORMAL LOW
Platelets: 95 — ABNORMAL LOW
Platelets: 99 — ABNORMAL LOW
RBC: 2.75 — ABNORMAL LOW
RBC: 3.17 — ABNORMAL LOW
RDW: 14
RDW: 14.3
RDW: 14.6
RDW: 15.1
RDW: 15.3
RDW: 15.7 — ABNORMAL HIGH
WBC: 6.1
WBC: 7.4

## 2011-08-10 LAB — COMPREHENSIVE METABOLIC PANEL
AST: 21
Albumin: 2.9 — ABNORMAL LOW
CO2: 23
Calcium: 8.7
Creatinine, Ser: 1.46
Potassium: 4
Sodium: 136
Total Protein: 6

## 2011-08-10 LAB — PROTIME-INR
INR: 2.7 — ABNORMAL HIGH
INR: 1.3
INR: 1.6 — ABNORMAL HIGH
INR: 2.5 — ABNORMAL HIGH
Prothrombin Time: 20.2 — ABNORMAL HIGH
Prothrombin Time: 32.3 — ABNORMAL HIGH

## 2011-08-10 LAB — CARDIAC PANEL(CRET KIN+CKTOT+MB+TROPI)
CK, MB: 2.5
Relative Index: INVALID
Relative Index: INVALID
Relative Index: INVALID
Relative Index: INVALID
Total CK: 50
Total CK: 60
Total CK: 97
Troponin I: 0.03
Troponin I: 0.04

## 2011-08-10 LAB — LIPID PANEL
Cholesterol: 161
HDL: 22 — ABNORMAL LOW
LDL Cholesterol: 97
Total CHOL/HDL Ratio: 7.3
Triglycerides: 211 — ABNORMAL HIGH

## 2011-08-10 LAB — MAGNESIUM: Magnesium: 2.1

## 2011-08-10 LAB — PREPARE FRESH FROZEN PLASMA

## 2011-08-10 LAB — HEPATIC FUNCTION PANEL
AST: 15
Alkaline Phosphatase: 58
Total Bilirubin: 0.7
Total Protein: 5.1 — ABNORMAL LOW

## 2011-08-10 LAB — HEMOGLOBIN AND HEMATOCRIT, BLOOD
HCT: 21.9 — ABNORMAL LOW
Hemoglobin: 6.9 — CL
Hemoglobin: 7.4 — CL

## 2011-08-10 LAB — GLUCOSE, CAPILLARY

## 2011-08-12 LAB — COMPREHENSIVE METABOLIC PANEL
ALT: 26
Alkaline Phosphatase: 103
BUN: 11
BUN: 13
CO2: 26
Calcium: 8.9
Chloride: 108
GFR calc non Af Amer: >60
Glucose, Bld: 105 — ABNORMAL HIGH
Glucose, Bld: 92
Potassium: 3.3 — ABNORMAL LOW
Sodium: 139
Total Bilirubin: 0.9
Total Protein: 6.2
Total Protein: 6.8

## 2011-08-12 LAB — URINE MICROSCOPIC-ADD ON

## 2011-08-12 LAB — DIFFERENTIAL
Basophils Absolute: 0
Basophils Absolute: 0
Basophils Relative: 1
Eosinophils Absolute: 0.1
Eosinophils Absolute: 0.1
Lymphocytes Relative: 35
Lymphs Abs: 1.4
Monocytes Relative: 14 — ABNORMAL HIGH
Neutrophils Relative %: 45
Neutrophils Relative %: 48

## 2011-08-12 LAB — CBC
Hemoglobin: 12.4 — ABNORMAL LOW
MCHC: 34.8
MCV: 92.4
Platelets: 157
RBC: 3.85 — ABNORMAL LOW
RDW: 15.6 — ABNORMAL HIGH
RDW: 15.6 — ABNORMAL HIGH
WBC: 4.1

## 2011-08-12 LAB — CK TOTAL AND CKMB (NOT AT ARMC)
CK, MB: 0.7
CK, MB: 0.8
Total CK: 54

## 2011-08-12 LAB — URINALYSIS, ROUTINE W REFLEX MICROSCOPIC
Bilirubin Urine: NEGATIVE
Glucose, UA: NEGATIVE
Leukocytes, UA: NEGATIVE
Nitrite: NEGATIVE
Specific Gravity, Urine: 1.025
pH: 6

## 2011-08-12 LAB — POCT CARDIAC MARKERS
CKMB, poc: <1 — ABNORMAL LOW
Myoglobin, poc: 49.7
Troponin i, poc: <0.05

## 2011-08-12 LAB — B-NATRIURETIC PEPTIDE (CONVERTED LAB): Pro B Natriuretic peptide (BNP): 714 — ABNORMAL HIGH

## 2011-08-12 LAB — RAPID URINE DRUG SCREEN, HOSP PERFORMED
Opiates: NOT DETECTED
Tetrahydrocannabinol: NOT DETECTED

## 2011-08-12 LAB — POCT I-STAT, CHEM 8
BUN: 17
Creatinine, Ser: 1.6 — ABNORMAL HIGH
Hemoglobin: 12.9 — ABNORMAL LOW
Potassium: 3.5
Sodium: 144
TCO2: 23

## 2011-08-12 LAB — APTT: aPTT: 31

## 2011-08-12 LAB — TROPONIN I
Troponin I: 0.01
Troponin I: 0.01
Troponin I: 0.01

## 2011-08-12 LAB — LIPID PANEL
Cholesterol: 152
Total CHOL/HDL Ratio: 6.6

## 2011-08-12 LAB — PROTIME-INR: INR: 1.3

## 2011-08-13 ENCOUNTER — Encounter: Payer: Self-pay | Admitting: Internal Medicine

## 2011-08-14 ENCOUNTER — Ambulatory Visit (INDEPENDENT_AMBULATORY_CARE_PROVIDER_SITE_OTHER): Payer: Medicare Other | Admitting: Internal Medicine

## 2011-08-14 ENCOUNTER — Encounter: Payer: Self-pay | Admitting: Internal Medicine

## 2011-08-14 DIAGNOSIS — I2510 Atherosclerotic heart disease of native coronary artery without angina pectoris: Secondary | ICD-10-CM

## 2011-08-14 DIAGNOSIS — I495 Sick sinus syndrome: Secondary | ICD-10-CM

## 2011-08-14 DIAGNOSIS — I10 Essential (primary) hypertension: Secondary | ICD-10-CM

## 2011-08-14 DIAGNOSIS — Z950 Presence of cardiac pacemaker: Secondary | ICD-10-CM

## 2011-08-14 LAB — PACEMAKER DEVICE OBSERVATION
AL AMPLITUDE: 3 mv
BAMS-0001: 150 {beats}/min
DEVICE MODEL PM: 1266473
RV LEAD AMPLITUDE: 5.3 mv
RV LEAD THRESHOLD: 1.375 V

## 2011-08-14 NOTE — Assessment & Plan Note (Signed)
His device is working normally. We'll plan to recheck in several months. 

## 2011-08-14 NOTE — Patient Instructions (Signed)
Your physician wants you to follow-up in: YEAR WITH DR TAYLOR You will receive a reminder letter in the mail two months in advance. If you don't receive a letter, please call our office to schedule the follow-up appointment. Your physician recommends that you continue on your current medications as directed. Please refer to the Current Medication list given to you today. 

## 2011-08-14 NOTE — Assessment & Plan Note (Signed)
The patient has class II anginal symptoms despite maximal medical therapy. I've recommended that he continue his current medications and that he try to increase his activity as tolerated.

## 2011-08-14 NOTE — Assessment & Plan Note (Signed)
His blood pressure remains well controlled on multiple medications. I've asked that he continue his current medications and maintain a low-sodium diet.

## 2011-08-14 NOTE — Progress Notes (Signed)
HPI Mr. Hiller returns today for followup. He is a pleasant 77 year old man coronary artery disease, symptomatic bradycardia, status post permanent pacemaker, and hypertension. The patient has class II anginal symptoms. Despite this, he continues to exercise walking on a regular basis and mows his grass. He denies syncope, shortness of breath, fevers or chills. He notes very mild peripheral edema. No Known Allergies   Current Outpatient Prescriptions  Medication Sig Dispense Refill  . amLODipine (NORVASC) 5 MG tablet Take 5 mg by mouth daily.        Marland Kitchen aspirin 81 MG tablet Take 81 mg by mouth daily.        . carvedilol (COREG) 25 MG tablet Take 25 mg by mouth 2 (two) times daily with a meal.        . clopidogrel (PLAVIX) 75 MG tablet Take 75 mg by mouth daily.        Tery Sanfilippo Sodium (COLACE PO) Take by mouth.        . ferrous sulfate 325 (65 FE) MG tablet Take 325 mg by mouth daily with breakfast.        . furosemide (LASIX) 20 MG tablet Take 20 mg by mouth daily.        . isosorbide mononitrate (IMDUR) 60 MG 24 hr tablet Take 60 mg by mouth daily.        Marland Kitchen LORazepam (ATIVAN) 1 MG tablet Take 1 mg by mouth every 8 (eight) hours.        . ranolazine (RANEXA) 500 MG 12 hr tablet Take 500 mg by mouth 2 (two) times daily.        . Rosuvastatin Calcium (CRESTOR PO) Take by mouth daily.        . temazepam (RESTORIL) 30 MG capsule Take 30 mg by mouth at bedtime as needed.           Past Medical History  Diagnosis Date  . Hypertension   . Pacemaker   . Coronary atherosclerosis   . Intermediate coronary syndrome   . Arrhythmia     afib, bradycardia  . Diastolic dysfunction     ROS:   All systems reviewed and negative except as noted in the HPI.   No past surgical history on file.   No family history on file.   History   Social History  . Marital Status: Married    Spouse Name: N/A    Number of Children: N/A  . Years of Education: N/A   Occupational History  . Not on  file.   Social History Main Topics  . Smoking status: Former Games developer  . Smokeless tobacco: Not on file  . Alcohol Use: Not on file  . Drug Use: Not on file  . Sexually Active: Not on file   Other Topics Concern  . Not on file   Social History Narrative  . No narrative on file     BP 119/61  Pulse 66  Ht 5\' 9"  (1.753 m)  Wt 184 lb 6.4 oz (83.643 kg)  BMI 27.23 kg/m2  Physical Exam:  Well appearing elderly man, NAD HEENT: Unremarkable Neck:  No JVD, no thyromegally Lymphatics:  No adenopathy Back:  No CVA tenderness Lungs:  Clear with no wheezes, rales, or rhonchi. HEART:  Regular rate rhythm, no murmurs, no rubs, no clicks Abd:  soft, positive bowel sounds, no organomegally, no rebound, no guarding Ext:  2 plus pulses, no edema, no cyanosis, no clubbing Skin:  No rashes no nodules Neuro:  CN  II through XII intact, motor grossly intact  DEVICE  Normal device function.  See PaceArt for details.   Assess/Plan:

## 2011-08-19 ENCOUNTER — Encounter: Payer: Self-pay | Admitting: Medical Oncology

## 2011-09-18 ENCOUNTER — Other Ambulatory Visit (HOSPITAL_BASED_OUTPATIENT_CLINIC_OR_DEPARTMENT_OTHER): Payer: Medicare Other | Admitting: Lab

## 2011-09-18 ENCOUNTER — Other Ambulatory Visit (HOSPITAL_COMMUNITY): Payer: Self-pay | Admitting: Oncology

## 2011-09-18 ENCOUNTER — Ambulatory Visit (HOSPITAL_BASED_OUTPATIENT_CLINIC_OR_DEPARTMENT_OTHER): Payer: Medicare Other | Admitting: Oncology

## 2011-09-18 DIAGNOSIS — D509 Iron deficiency anemia, unspecified: Secondary | ICD-10-CM

## 2011-09-18 DIAGNOSIS — Z862 Personal history of diseases of the blood and blood-forming organs and certain disorders involving the immune mechanism: Secondary | ICD-10-CM | POA: Insufficient documentation

## 2011-09-18 LAB — COMPREHENSIVE METABOLIC PANEL
ALT: 10 U/L (ref 0–53)
AST: 14 U/L (ref 0–37)
Chloride: 103 mEq/L (ref 96–112)
Creatinine, Ser: 1.18 mg/dL (ref 0.50–1.35)
Sodium: 139 mEq/L (ref 135–145)
Total Bilirubin: 0.7 mg/dL (ref 0.3–1.2)
Total Protein: 6.8 g/dL (ref 6.0–8.3)

## 2011-09-18 LAB — CBC WITH DIFFERENTIAL/PLATELET
Basophils Absolute: 0 10*3/uL (ref 0.0–0.1)
EOS%: 4.5 % (ref 0.0–7.0)
HGB: 12.4 g/dL — ABNORMAL LOW (ref 13.0–17.1)
MCH: 32.1 pg (ref 27.2–33.4)
MCV: 94 fL (ref 79.3–98.0)
MONO%: 14 % (ref 0.0–14.0)
RDW: 14.2 % (ref 11.0–14.6)

## 2011-09-18 LAB — IRON AND TIBC
%SAT: 29 % (ref 20–55)
Iron: 69 ug/dL (ref 42–165)
UIBC: 171 ug/dL (ref 125–400)

## 2011-09-18 LAB — FERRITIN: Ferritin: 100 ng/mL (ref 22–322)

## 2011-09-18 NOTE — Progress Notes (Signed)
  This office note has been dictated.  #[id number]

## 2011-09-18 NOTE — Progress Notes (Signed)
CC:   Osvaldo Shipper. Nocito, M.D. Shirley Friar, MD  HISTORY:  I saw Refugio Vandevoorde today for followup of his history of iron deficiency anemia dating back to April 2007.  Mr. Mende has been treated with oral iron, currently 325 mg of ferrous sulfate twice a day. Mr. Sahli was last seen by [place] on 07/20/2011.  He was accompanied today by his wife, Irving Burton.  In general, Mr. Bolger has been doing fairly well. He uses a cane.  He says that he has had some falls and I believe he was admitted to the hospital several months ago.  At the present time he has no complaints.  He denies any obvious blood in the stools.  The stools are dark probably due to the iron.  All things considered he seems to be getting along fairly well, considering his age of 78, and with his history of coronary artery disease status post coronary artery bypass surgery dating back, I believe, to 2004.  He has also had some stents placed.  He has a history of hypertension and dyslipidemia.  MEDICATIONS:  Reviewed and recorded.  We are going to make an adjustment in his iron ferrous sulfate.  It was recommended by me that he increase his ferrous sulfate to 325 mg 3 times a day.  PHYSICAL EXAMINATION:  General:  Mr. Dolley is a little unsteady on his feet.  As stated, he is using a cane.  Vital signs:  Weight is stable at 185 pounds.  Height 5 feet 9 inches, blood pressure 118/61.  Other vital signs are normal.  HEENT:  There is no scleral icterus.  Mouth and pharynx are benign.  There was no peripheral adenopathy palpable. Lungs:  Are clear to percussion and auscultation.  Cardiac:  Regular rhythm without obvious murmur or rub.  There is left-sided pacemaker. Abdomen:  With the patient sitting is benign.  Extremities:  No peripheral edema.  The patient uses a cane in his right hand.  LABORATORY DATA:  Today, white count 3.7, ANC 2.4, hemoglobin 12.4, hematocrit 36.3, platelets 126,000.  Chemistries and iron studies  today are pending.  Chemistries from 07/20/2011 were normal except for a glucose of 115.  Ferritin on 09/10 was 59, down from 68 on 07/10 and down from 86 on 04/07/2011.  Iron saturation on 07/20/2011 was 35%.  IMPRESSION AND PLAN:  Mr. Musa's hemoglobin and hematocrit today are quite satisfactory although slightly below our normal values.  Red cell indices are normal.  The ferritin has decreased somewhat.  Mr. Stepanek may be having intermittent gastrointestinal bleeding.  Of note is the fact that he did undergo endoscopy and colonoscopy by Dr. Bosie Clos on 04/22/2011.  There were no significant abnormalities. As stated, will increase the ferrous sulfate to one 3 times a day.  We will plan to see Mr. Ha again in about 4 months at which time we can check CBC, chemistries and iron studies.  All things considered Mr. Goldenstein seems to be doing well.    ______________________________ Samul Dada, M.D. DSM/MEDQ  D:  09/18/2011  T:  09/18/2011  Job:  [id number]

## 2011-11-10 HISTORY — PX: CARDIAC CATHETERIZATION: SHX172

## 2011-12-08 ENCOUNTER — Ambulatory Visit
Admission: RE | Admit: 2011-12-08 | Discharge: 2011-12-08 | Disposition: A | Discharge status: Home / Self Care | Payer: Medicare Other | Source: Ambulatory Visit | Attending: Cardiovascular Disease | Admitting: Cardiovascular Disease

## 2011-12-08 ENCOUNTER — Other Ambulatory Visit: Payer: Self-pay | Admitting: Cardiovascular Disease

## 2011-12-09 ENCOUNTER — Other Ambulatory Visit: Payer: Self-pay | Admitting: Cardiovascular Disease

## 2011-12-09 ENCOUNTER — Encounter (HOSPITAL_COMMUNITY): Payer: Self-pay | Admitting: Pharmacy Technician

## 2011-12-10 ENCOUNTER — Encounter (HOSPITAL_COMMUNITY)
Admission: RE | Disposition: A | Discharge status: Home / Self Care | Payer: Self-pay | Source: Ambulatory Visit | Attending: Cardiovascular Disease

## 2011-12-10 ENCOUNTER — Ambulatory Visit (HOSPITAL_COMMUNITY)
Admission: RE | Admit: 2011-12-10 | Discharge: 2011-12-10 | Disposition: A | Discharge status: Home / Self Care | Payer: Medicare Other | Source: Ambulatory Visit | Attending: Cardiovascular Disease | Admitting: Cardiovascular Disease

## 2011-12-10 DIAGNOSIS — I25810 Atherosclerosis of coronary artery bypass graft(s) without angina pectoris: Secondary | ICD-10-CM | POA: Insufficient documentation

## 2011-12-10 DIAGNOSIS — E785 Hyperlipidemia, unspecified: Secondary | ICD-10-CM | POA: Insufficient documentation

## 2011-12-10 DIAGNOSIS — I2510 Atherosclerotic heart disease of native coronary artery without angina pectoris: Secondary | ICD-10-CM | POA: Insufficient documentation

## 2011-12-10 DIAGNOSIS — Z79899 Other long term (current) drug therapy: Secondary | ICD-10-CM | POA: Insufficient documentation

## 2011-12-10 DIAGNOSIS — Z950 Presence of cardiac pacemaker: Secondary | ICD-10-CM | POA: Insufficient documentation

## 2011-12-10 DIAGNOSIS — Z7982 Long term (current) use of aspirin: Secondary | ICD-10-CM | POA: Insufficient documentation

## 2011-12-10 DIAGNOSIS — Z951 Presence of aortocoronary bypass graft: Secondary | ICD-10-CM | POA: Insufficient documentation

## 2011-12-10 DIAGNOSIS — Z7902 Long term (current) use of antithrombotics/antiplatelets: Secondary | ICD-10-CM | POA: Insufficient documentation

## 2011-12-10 HISTORY — PX: LEFT HEART CATHETERIZATION WITH CORONARY/GRAFT ANGIOGRAM: SHX5450

## 2011-12-10 LAB — LIPID PANEL
Cholesterol: 124 mg/dL (ref 0–200)
Total CHOL/HDL Ratio: 2.7 RATIO
Triglycerides: 90 mg/dL (ref <150)
VLDL: 18 mg/dL (ref 0–40)

## 2011-12-10 LAB — LDL CHOLESTEROL, DIRECT: Direct LDL: 55 mg/dL

## 2011-12-10 SURGERY — LEFT HEART CATHETERIZATION WITH CORONARY/GRAFT ANGIOGRAM
Anesthesia: LOCAL

## 2011-12-10 MED ORDER — SODIUM CHLORIDE 0.9 % IJ SOLN: 3.0000 mL | Freq: Two times a day (BID) | INTRAMUSCULAR | Status: DC

## 2011-12-10 MED ORDER — LIDOCAINE HCL (PF) 1 % IJ SOLN
INTRAMUSCULAR | Status: AC
  Filled 2011-12-10: qty 30

## 2011-12-10 MED ORDER — FERROUS SULFATE 325 (65 FE) MG PO TABS: 325.0000 mg | ORAL_TABLET | Freq: Three times a day (TID) | ORAL | Status: DC

## 2011-12-10 MED ORDER — ISOSORBIDE MONONITRATE ER 60 MG PO TB24: 60.0000 mg | ORAL_TABLET | Freq: Every day | ORAL | Status: DC

## 2011-12-10 MED ORDER — FUROSEMIDE 20 MG PO TABS: 20.0000 mg | ORAL_TABLET | Freq: Every day | ORAL | Status: DC

## 2011-12-10 MED ORDER — HEPARIN (PORCINE) IN NACL 2-0.9 UNIT/ML-% IJ SOLN
INTRAMUSCULAR | Status: AC
  Filled 2011-12-10: qty 2000

## 2011-12-10 MED ORDER — FENTANYL CITRATE 0.05 MG/ML IJ SOLN
INTRAMUSCULAR | Status: AC
  Filled 2011-12-10: qty 2

## 2011-12-10 MED ORDER — ALFUZOSIN HCL ER 10 MG PO TB24: 10.0000 mg | ORAL_TABLET | Freq: Every day | ORAL | Status: DC

## 2011-12-10 MED ORDER — CARVEDILOL 12.5 MG PO TABS: 25.0000 mg | ORAL_TABLET | Freq: Two times a day (BID) | ORAL | Status: DC

## 2011-12-10 MED ORDER — DEXTROSE-NACL 5-0.45 % IV SOLN: INTRAVENOUS | Status: DC

## 2011-12-10 MED ORDER — ISOSORBIDE MONONITRATE ER 60 MG PO TB24: 120.0000 mg | ORAL_TABLET | Freq: Every day | ORAL | Status: DC

## 2011-12-10 MED ORDER — ONDANSETRON HCL 4 MG/2ML IJ SOLN: 4.0000 mg | Freq: Four times a day (QID) | INTRAMUSCULAR | Status: DC | PRN

## 2011-12-10 MED ORDER — MIDAZOLAM HCL 2 MG/2ML IJ SOLN
INTRAMUSCULAR | Status: AC
  Filled 2011-12-10: qty 2

## 2011-12-10 MED ORDER — RANOLAZINE ER 500 MG PO TB12: 1000.0000 mg | ORAL_TABLET | Freq: Two times a day (BID) | ORAL | Status: DC

## 2011-12-10 MED ORDER — CLOPIDOGREL BISULFATE 75 MG PO TABS: 75.0000 mg | ORAL_TABLET | Freq: Every day | ORAL | Status: DC

## 2011-12-10 MED ORDER — ASPIRIN 81 MG PO TABS: 81.0000 mg | ORAL_TABLET | Freq: Every day | ORAL | Status: DC

## 2011-12-10 MED ORDER — AMLODIPINE BESYLATE 5 MG PO TABS: 5.0000 mg | ORAL_TABLET | Freq: Every day | ORAL | Status: DC

## 2011-12-10 MED ORDER — LUBIPROSTONE 24 MCG PO CAPS: 24.0000 ug | ORAL_CAPSULE | Freq: Every day | ORAL | Status: DC | PRN

## 2011-12-10 MED ORDER — PANTOPRAZOLE SODIUM 40 MG PO TBEC: 40.0000 mg | DELAYED_RELEASE_TABLET | Freq: Two times a day (BID) | ORAL | Status: DC

## 2011-12-10 MED ORDER — ACETAMINOPHEN 325 MG PO TABS: 650.0000 mg | ORAL_TABLET | ORAL | Status: DC | PRN

## 2011-12-10 MED ORDER — ASPIRIN EC 81 MG PO TBEC: 81.0000 mg | DELAYED_RELEASE_TABLET | Freq: Every day | ORAL | Status: DC

## 2011-12-10 MED ORDER — ROSUVASTATIN CALCIUM 20 MG PO TABS: 20.0000 mg | ORAL_TABLET | Freq: Every day | ORAL | Status: DC

## 2011-12-10 MED ORDER — NITROGLYCERIN 0.2 MG/ML ON CALL CATH LAB
INTRAVENOUS | Status: AC
  Filled 2011-12-10: qty 1

## 2011-12-10 MED ORDER — DIAZEPAM 5 MG PO TABS
5.0000 mg | ORAL_TABLET | ORAL | Status: AC
  Administered 2011-12-10: 5 mg via ORAL
  Filled 2011-12-10: qty 1

## 2011-12-10 MED ORDER — SODIUM CHLORIDE 0.9 % IV SOLN
INTRAVENOUS | Status: DC
  Administered 2011-12-10: 08:00:00 via INTRAVENOUS

## 2011-12-10 MED ORDER — SODIUM CHLORIDE 0.9 % IJ SOLN: 3.0000 mL | INTRAMUSCULAR | Status: DC | PRN

## 2011-12-10 MED ORDER — SODIUM CHLORIDE 0.9 % IV SOLN: INTRAVENOUS | Status: DC

## 2011-12-10 NOTE — H&P (Signed)
Updated H&P:  See my complete and comprehensive dictation from 12/08/11.  Since pt has required NTG use last night for chest pain. Discussed cath and possible PCI with pt and family.  No change in PEx. Cath this am.  KELLY,THOMAS A 12/10/2011 8:09 AM

## 2011-12-10 NOTE — Brief Op Note (Signed)
12/10/2011  10:03 AM  PATIENT:  John Peck  78 y.o. male  PRE-OPERATIVE DIAGNOSIS:  Cad/ prior CABG  Full note dictated; see diagram   DICTATION # [id number], [id number]  Will increase medical therapy with plans to f/u lexiscan myoview to determine area of ischemia.  Lennette Bihari, MD, [facility] 12/10/2011 10:04 AM

## 2011-12-10 NOTE — Cardiovascular Report (Signed)
[name], [name] NO.:  [id number]  MEDICAL RECORD NO.:  [id number]  LOCATION:  [facility]                         FACILITY:  [facility]  PHYSICIAN:  Nicki Guadalajara, M.D.     DATE OF BIRTH:  03-29-1924  DATE OF PROCEDURE:  12/10/2011 DATE OF DISCHARGE:                           CARDIAC CATHETERIZATION   PROCEDURE:  Cardiac catheterization.  INDICATIONS:  [name] is an 78 year old African American male, who underwent CABG revascularization surgery in 1989 and had a LIMA placed to the LAD and a Y graft placed to his intermediate and his circumflex vessel.  In 2004, he underwent stenting of his proximal right coronary artery.  Subsequent catheterizations have shown total occlusion of the graft to the circumflex and intermediate vessel with his last catheterization being in January 2010.  He is status post permanent pacemaker for sick sinus syndrome and has a history of atrial fibrillation.  He recently developed progressive increase in the frequency of exertional chest pain and has been requiring nitroglycerin almost on a daily basis.  He was seen in the office on December 08, 2011, is now scheduled for definitive repeat cardiac catheterization.  PROCEDURE:  After premedication with Versed 1 mg plus fentanyl 25 mcg, the patient was prepped and draped in the usual fashion.  His right femoral artery was punctured anteriorly and a 5-French sheath was inserted without difficulty.  Diagnostic cardiac catheterization was done utilizing 5-French Judkins 4 left and right coronary catheters. LIMA catheter was necessary for angiography into the left subclavian internal mammary artery.  Angiography also was done which visualized the occluded graft in the aorta at the origin.  A pigtail catheter was used for biplane cine left ventriculography. Hemostasis was obtained by direct manual pressure.  HEMODYNAMIC DATA:  Central aortic pressure was 135/77, left  ventricle pressure 155/11, post A-wave 17.  ANGIOGRAPHIC DATA:  Left main coronary artery was angiographically normal and bifurcated into an LAD and left circumflex system.  The LAD gave rise to 2 proximal diagonal vessels and septal perforating artery and then had 80% stenosis.  The mid LAD was totally occluded.  The circumflex vessel now had 60-70% ostial narrowing diffusely.  There was 40% proximal stenosis.  There was distal 70-80% stenosis.  The right coronary artery was large caliber vessel.  The stent in the proximal right coronary artery was widely patent.  There was a 90% stenosis diffusely in the proximal portion of the anterior RV marginal branch.  There was 40-50% stenosis in the RCA after this marginal takeoff.  There was 70% stenosis on a bend in the vessel beyond the crux.  There was 70-80% stenosis after the PDA takeoff in the continuation branch leading to the PLA.  The nubbin was visualized which had represented the Y-graft supplying the intermediate and circumflex concordant with its old total occlusion.  The left internal mammary artery was widely patent.  The mid distal LAD beyond this was diffusely small caliber vessel without high-grade focal stenosis.  biplane cine left ventriculography revealed mild LV dysfunction with an ejection fraction of approximately 50%.  On the RAO projection, there was faint apical lateral hypocontractility, distal inferior hypocontractility, and very focal mid inferior  hypocontractility.  On the LAO projection, there appeared to be low posterolateral hypocontractility.  IMPRESSION: 1. Low normal mild left ventricular dysfunction with an ejection     fraction of 50% and evidence for mid inferior, distal inferior,     apical lateral hypocontractility, and posterolateral     hypocontractility. 2. Significant native coronary artery disease with 80% stenosis of the     left anterior descending after the 2nd diagonal vessel and  septal     perforating artery with total occlusion of the mid left anterior     descending. 3. 60-70% ostial stenosis in the circumflex vessel followed by 40%     proximal stenosis and 70-80% distal circumflex stenosis. 4. Widely patent stent in the proximal right coronary artery with 90%     stenosis, and the anterior right ventricular marginal branch     arising from the mid right coronary artery with 40-50% narrowing in     the mid right coronary artery, 70% narrowing on the sharp bend in     the vessel beyond the crux before the posterior descending artery     takeoff in 70-80% stenosis in a small portion of the continuation     branch just after the posterior descending artery takeoff. 5. Occluded Y graft which previously was sequential supplying the     intermediate circumflex. 6. Patent left internal mammary artery to the left anterior     descending, but the distal left anterior descending was small     caliber and diffusely irregular without high-grade focal stenosis.  RECOMMENDATION:  [name] will be aggressively hydrated.  His current catheterization was reviewed with his last cardiac catheterization.  He does have slight progression of disease particularly involving the ostium of the circumflex, distal circumflex, as well as his right coronary artery.  He will be placed on increased medical therapy. Consideration for [facility] on increased medical therapy may be performed in an attempt to determine if significant residual ischemia is still present on medical therapy with possible directed intervention based on that finding.          ______________________________ Nicki Guadalajara, M.D.     TK/MEDQ  D:  12/10/2011  T:  12/10/2011  Job:  [id number]

## 2011-12-14 ENCOUNTER — Encounter: Payer: Self-pay | Admitting: Internal Medicine

## 2011-12-14 DIAGNOSIS — I498 Other specified cardiac arrhythmias: Secondary | ICD-10-CM

## 2011-12-25 ENCOUNTER — Other Ambulatory Visit: Payer: Self-pay | Admitting: Physician Assistant

## 2012-01-15 ENCOUNTER — Other Ambulatory Visit (HOSPITAL_BASED_OUTPATIENT_CLINIC_OR_DEPARTMENT_OTHER): Payer: Medicare Other | Admitting: Lab

## 2012-01-15 ENCOUNTER — Ambulatory Visit (HOSPITAL_BASED_OUTPATIENT_CLINIC_OR_DEPARTMENT_OTHER): Payer: Medicare Other | Admitting: Oncology

## 2012-01-15 ENCOUNTER — Encounter: Payer: Self-pay | Admitting: Oncology

## 2012-01-15 VITALS — BP 134/60 | HR 60 | Temp 97.0°F | Ht 69.0 in | Wt 187.9 lb

## 2012-01-15 DIAGNOSIS — D649 Anemia, unspecified: Secondary | ICD-10-CM

## 2012-01-15 DIAGNOSIS — Z862 Personal history of diseases of the blood and blood-forming organs and certain disorders involving the immune mechanism: Secondary | ICD-10-CM

## 2012-01-15 LAB — CBC WITH DIFFERENTIAL/PLATELET
Basophils Absolute: 0 10*3/uL (ref 0.0–0.1)
EOS%: 4 % (ref 0.0–7.0)
HCT: 35.5 % — ABNORMAL LOW (ref 38.4–49.9)
HGB: 12.1 g/dL — ABNORMAL LOW (ref 13.0–17.1)
MCH: 32.7 pg (ref 27.2–33.4)
MCV: 96 fL (ref 79.3–98.0)
MONO%: 13.1 % (ref 0.0–14.0)
NEUT%: 51.3 % (ref 39.0–75.0)

## 2012-01-15 LAB — COMPREHENSIVE METABOLIC PANEL
AST: 11 U/L (ref 0–37)
Alkaline Phosphatase: 109 U/L (ref 39–117)
BUN: 17 mg/dL (ref 6–23)
Calcium: 8.6 mg/dL (ref 8.4–10.5)
Creatinine, Ser: 1.29 mg/dL (ref 0.50–1.35)

## 2012-01-15 LAB — IRON AND TIBC
TIBC: 221 ug/dL (ref 215–435)
UIBC: 169 ug/dL (ref 125–400)

## 2012-01-15 NOTE — Progress Notes (Signed)
This office note has been dictated.  #[id number]

## 2012-01-15 NOTE — Progress Notes (Signed)
CC:   Shirley Friar, MD Osvaldo Shipper. Mcfadden, M.D. Nicki Guadalajara, M.D.  PROBLEM LIST: 1. Iron deficiency anemia dating back to April 2007.  The patient is     currently on oral iron.  He had an endoscopy and colonoscopy by Dr.     Charlott Rakes on 04/20/2011.  There were no significant     abnormalities. 2. Coronary artery disease status post CABG and status post coronary     artery stents. 3. Permanent dual chamber pacemaker placed in 1989. 4. Hypertension. 5. Dyslipidemia. 6. BPH status post TURP in 1992. 7. History of liver abscess.  MEDICATIONS: 1. Uroxatral 10 mg daily. 2. Norvasc 10 mg daily. 3. Aspirin 81 mg daily. 4. Coreg 25 mg twice daily. 5. Plavix 75 mg daily. 6. Ferrous sulfate 325 mg 3 times a day. 7. Lasix 20 mg daily. 8. Imdur 90 mg in the morning, 30 mg in the evening. 9. Amitiza 25 mcg daily. 10.Protonix 40 mg twice daily. 11.Ranexa 1000 mg twice daily. 12.Crestor 20 mg daily.  HISTORY:  Neithan Day is an 78 year old African American gentleman accompanied today by his wife, Irving Burton.  Mr. Edelson is followed by [place] for a history of iron deficiency anemia.  We first saw him in May of 2008. His most recent visit here was on 09/18/2011.  Mr. Amezcua denies any major change in his condition.  His main complaint is of some dizziness especially when he gets up.  He has had some falls but none with serious injury.  He uses a cane.  He saw Dr. Nicki Guadalajara yesterday and there were some dose reductions in his medicines.  The patient denies any obvious blood in the stools.  Stools are dark, as he is on iron.  PHYSICAL EXAMINATION:  Mr. Eskew is an elderly gentleman, somewhat frail, needs some help getting up and down from the examining table. Weight is 187.9 pounds, height 5 feet 9 inches, body surface area 2.04 meters squared.  Blood pressure 134/60, other vital signs are normal. There is no scleral icterus.  He wears glasses.  Mouth and pharynx  are benign.  Heart and lungs are normal.  Rhythm is regular.  There is a left-sided pacemaker.  Abdomen is quite firm with no organomegaly or masses palpable.  Extremities, no peripheral edema.  Neurologic exam is nonfocal.  LABORATORY DATA:  Today, white count 4.1, ANC 2.1, hemoglobin 12.1, hematocrit 35.5, platelets 137,000.  Platelets on 11/09 were 126,000 and hemoglobin was 12.4.  Red cell indices are normal.  Chemistries and iron studies today are pending.  Chemistries from 09/18/2011 were normal except for a glucose of 107.  BUN was 15, creatinine 1.18.  On 09/18/2011, the ferritin was 100, iron saturation 29%.  On 07/20/2011, ferritin was 59, iron saturation 35%.  On 05/19/2011, ferritin was 68 and iron saturation 24%.  IMAGING STUDIES: 1. CT scan of the head without IV contrast on 05/28/2011 showed no     acute intracranial abnormality. 2. Chest x-ray (2 view) from 12/08/2011 showed COPD and chronic     bronchitis/scarring.  There were no acute processes.  IMPRESSION AND PLAN:  Mr. Pavlak's hemoglobin and hematocrit and iron studies are satisfactory.  We will keep him on ferrous sulfate 3 times a day.  We are awaiting results of today's iron studies.  We will make adjustments accordingly.  We will check CBC and ferritin in 3 months and plan to see Mr. Cubillos again in 6 months at which time we will  check CBC, chemistries and iron studies.    ______________________________ Samul Dada, M.D. DSM/MEDQ  D:  01/15/2012  T:  01/15/2012  Job:  [id number]

## 2012-02-15 ENCOUNTER — Ambulatory Visit: Payer: Medicare Other | Admitting: Oncology

## 2012-03-14 ENCOUNTER — Encounter: Payer: Self-pay | Admitting: Internal Medicine

## 2012-03-14 DIAGNOSIS — I498 Other specified cardiac arrhythmias: Secondary | ICD-10-CM

## 2012-03-23 ENCOUNTER — Other Ambulatory Visit: Payer: Self-pay | Admitting: Cardiovascular Disease

## 2012-03-23 ENCOUNTER — Ambulatory Visit
Admission: RE | Admit: 2012-03-23 | Discharge: 2012-03-23 | Disposition: A | Discharge status: Home / Self Care | Payer: Medicare Other | Source: Ambulatory Visit | Attending: Cardiovascular Disease | Admitting: Cardiovascular Disease

## 2012-03-23 DIAGNOSIS — R0781 Pleurodynia: Secondary | ICD-10-CM

## 2012-04-15 ENCOUNTER — Other Ambulatory Visit (HOSPITAL_BASED_OUTPATIENT_CLINIC_OR_DEPARTMENT_OTHER): Payer: Medicare Other

## 2012-04-15 DIAGNOSIS — Z862 Personal history of diseases of the blood and blood-forming organs and certain disorders involving the immune mechanism: Secondary | ICD-10-CM

## 2012-04-15 DIAGNOSIS — D649 Anemia, unspecified: Secondary | ICD-10-CM

## 2012-04-15 LAB — CBC WITH DIFFERENTIAL/PLATELET
BASO%: 0.8 % (ref 0.0–2.0)
Eosinophils Absolute: 0.2 10*3/uL (ref 0.0–0.5)
MCV: 96.2 fL (ref 79.3–98.0)
MONO%: 14.3 % — ABNORMAL HIGH (ref 0.0–14.0)
NEUT#: 1.8 10*3/uL (ref 1.5–6.5)
RBC: 3.6 10*6/uL — ABNORMAL LOW (ref 4.20–5.82)
RDW: 14.1 % (ref 11.0–14.6)
WBC: 3.6 10*3/uL — ABNORMAL LOW (ref 4.0–10.3)

## 2012-04-15 LAB — FERRITIN: Ferritin: 108 ng/mL (ref 22–322)

## 2012-05-03 ENCOUNTER — Other Ambulatory Visit: Payer: Self-pay | Admitting: Neurology

## 2012-05-03 DIAGNOSIS — E785 Hyperlipidemia, unspecified: Secondary | ICD-10-CM

## 2012-05-03 DIAGNOSIS — I10 Essential (primary) hypertension: Secondary | ICD-10-CM

## 2012-05-03 DIAGNOSIS — I4891 Unspecified atrial fibrillation: Secondary | ICD-10-CM

## 2012-05-03 DIAGNOSIS — R269 Unspecified abnormalities of gait and mobility: Secondary | ICD-10-CM

## 2012-05-11 ENCOUNTER — Other Ambulatory Visit: Payer: Self-pay | Admitting: Neurology

## 2012-05-11 DIAGNOSIS — I10 Essential (primary) hypertension: Secondary | ICD-10-CM

## 2012-05-11 DIAGNOSIS — I4891 Unspecified atrial fibrillation: Secondary | ICD-10-CM

## 2012-05-11 DIAGNOSIS — E785 Hyperlipidemia, unspecified: Secondary | ICD-10-CM

## 2012-05-11 DIAGNOSIS — R269 Unspecified abnormalities of gait and mobility: Secondary | ICD-10-CM

## 2012-05-26 ENCOUNTER — Ambulatory Visit
Admission: RE | Admit: 2012-05-26 | Discharge: 2012-05-26 | Disposition: A | Discharge status: Home / Self Care | Payer: Medicare Other | Source: Ambulatory Visit | Attending: Neurology | Admitting: Neurology

## 2012-05-26 DIAGNOSIS — I4891 Unspecified atrial fibrillation: Secondary | ICD-10-CM

## 2012-05-26 DIAGNOSIS — E785 Hyperlipidemia, unspecified: Secondary | ICD-10-CM

## 2012-05-26 DIAGNOSIS — I10 Essential (primary) hypertension: Secondary | ICD-10-CM

## 2012-05-26 DIAGNOSIS — R269 Unspecified abnormalities of gait and mobility: Secondary | ICD-10-CM

## 2012-06-13 DIAGNOSIS — I498 Other specified cardiac arrhythmias: Secondary | ICD-10-CM

## 2012-06-14 ENCOUNTER — Ambulatory Visit (HOSPITAL_COMMUNITY)
Admission: RE | Admit: 2012-06-14 | Discharge: 2012-06-14 | Disposition: A | Discharge status: Home / Self Care | Payer: Medicare Other | Source: Ambulatory Visit | Attending: Cardiology | Admitting: Cardiology

## 2012-06-14 DIAGNOSIS — M7989 Other specified soft tissue disorders: Secondary | ICD-10-CM

## 2012-06-14 NOTE — Progress Notes (Signed)
Bilateral:  No evidence of DVT, superficial thrombosis, or Baker's Cyst.   

## 2012-07-22 ENCOUNTER — Encounter: Payer: Self-pay | Admitting: Family

## 2012-07-22 ENCOUNTER — Telehealth: Payer: Self-pay | Admitting: Oncology

## 2012-07-22 ENCOUNTER — Ambulatory Visit (HOSPITAL_BASED_OUTPATIENT_CLINIC_OR_DEPARTMENT_OTHER): Payer: Medicare Other | Admitting: Family

## 2012-07-22 ENCOUNTER — Ambulatory Visit: Payer: Medicare Other | Admitting: Oncology

## 2012-07-22 ENCOUNTER — Other Ambulatory Visit (HOSPITAL_BASED_OUTPATIENT_CLINIC_OR_DEPARTMENT_OTHER): Payer: Medicare Other

## 2012-07-22 VITALS — BP 113/51 | HR 60 | Temp 97.8°F | Resp 20 | Ht 69.0 in | Wt 190.0 lb

## 2012-07-22 DIAGNOSIS — D509 Iron deficiency anemia, unspecified: Secondary | ICD-10-CM

## 2012-07-22 DIAGNOSIS — Z862 Personal history of diseases of the blood and blood-forming organs and certain disorders involving the immune mechanism: Secondary | ICD-10-CM

## 2012-07-22 DIAGNOSIS — D649 Anemia, unspecified: Secondary | ICD-10-CM

## 2012-07-22 LAB — COMPREHENSIVE METABOLIC PANEL (CC13)
AST: 9 U/L (ref 5–34)
Alkaline Phosphatase: 106 U/L (ref 40–150)
BUN: 14 mg/dL (ref 7.0–26.0)
Creatinine: 1.2 mg/dL (ref 0.7–1.3)

## 2012-07-22 LAB — IRON AND TIBC
%SAT: 25 % (ref 20–55)
Iron: 59 ug/dL (ref 42–165)
TIBC: 238 ug/dL (ref 215–435)
UIBC: 179 ug/dL (ref 125–400)

## 2012-07-22 LAB — CBC WITH DIFFERENTIAL/PLATELET
BASO%: 0.5 % (ref 0.0–2.0)
HCT: 34.8 % — ABNORMAL LOW (ref 38.4–49.9)
MCHC: 33.8 g/dL (ref 32.0–36.0)
MONO#: 0.4 10*3/uL (ref 0.1–0.9)
NEUT%: 58.2 % (ref 39.0–75.0)
RBC: 3.59 10*6/uL — ABNORMAL LOW (ref 4.20–5.82)
WBC: 3.9 10*3/uL — ABNORMAL LOW (ref 4.0–10.3)
lymph#: 1 10*3/uL (ref 0.9–3.3)

## 2012-07-22 NOTE — Telephone Encounter (Signed)
Gave pt appt for December 2013 lab only and see Md in March 2014 with labs

## 2012-07-22 NOTE — Patient Instructions (Signed)
Keep up the great work. Get up slowly from a lying or sitting position to help with any dizziness

## 2012-07-22 NOTE — Progress Notes (Signed)
Patient ID: John Peck, male   DOB: 1924/05/12, 78 y.o.   MRN: [id number] CSN: [id number]  CC: John Friar, MD  John Peck. Cubit, M.D.  John Peck, M.D.   Problem List: John Peck is a 78 y.o. African-American male with a problem list consisting of:  1. Iron deficiency anemia dating back to April 2007. The patient is currently on oral iron. He had an endoscopy and colonoscopy by Dr. Charlott Rakes on 04/20/2011. There were no significant abnormalities.  2. Coronary artery disease status post CABG and status post coronary artery stents.  3. Permanent dual chamber pacemaker placed in 1989.  4. Hypertension.  5. Dyslipidemia.  6. BPH status post TURP in 1992.  7. History of liver abscess.  John Peck is an 78 year old African American gentleman accompanied today by his wife, John Peck.  [name] is followed by [place] for  a history of iron deficiency anemia. We first saw him in May of 2008. His most recent visit here was on 01/15/2012. [name] denies any major change in his condition and he is without major complaint today.  He denies any dizziness, SOB, HA, unusual bleeding or bruising, fever, chills, Pica or any falls in 6-8 months.  The patient uses a cane for ambulation assistance. He states that his energy level is good.  The patient states that he does have occasional insomnia but will discuss this with his PCP Dr. Tresa Endo next month during their office visit.  [name] notes that the patient's hearing has worsened, but they have an appointment to get his hearing checked next week. The patient was seen by his Cardiologist, Dr. Algie Coffer who ordered a CXR and x-ray of his ribs on 03/23/2012 for a complaint of chest pain and a fall.  Mr. Corner had a CT of the Head on 05/26/2012 ordered by Dr. Terrace Arabia for a complaint of dizziness and gait abnormality.  The patient and his wife deny any other symptomatology during today's visit.   Past Medical History: Past Medical  History  Diagnosis Date  . Hypertension   . Pacemaker   . Coronary atherosclerosis   . Intermediate coronary syndrome   . Arrhythmia     afib, bradycardia  . Diastolic dysfunction   . Anemia     Surgical History: Past Surgical History  Procedure Date  . Coronary artery bypass graft 2004    Current Medications: Current Outpatient Prescriptions  Medication Sig Dispense Refill  . alfuzosin (UROXATRAL) 10 MG 24 hr tablet Take 10 mg by mouth daily.      John Peck amLODipine (NORVASC) 5 MG tablet Take 10 mg by mouth daily.       John Peck aspirin 81 MG tablet Take 81 mg by mouth daily.        . carvedilol (COREG) 25 MG tablet Take 25 mg by mouth 2 (two) times daily with a meal.        . clopidogrel (PLAVIX) 75 MG tablet Take 75 mg by mouth daily.        . ferrous sulfate 325 (65 FE) MG tablet Take 325 mg by mouth 3 (three) times daily with meals.       . furosemide (LASIX) 20 MG tablet Take 20 mg by mouth daily.        . isosorbide mononitrate (IMDUR) 60 MG 24 hr tablet Take 60 mg by mouth. Take 90 mg in AM and 30 mg in PM      . lubiprostone (AMITIZA) 24 MCG capsule  Take 24 mcg by mouth daily as needed. For bowel movement      . pantoprazole (PROTONIX) 40 MG tablet Take 40 mg by mouth 2 (two) times daily.      . ranolazine (RANEXA) 500 MG 12 hr tablet Take 1,000 mg by mouth 2 (two) times daily.       . rosuvastatin (CRESTOR) 20 MG tablet Take 20 mg by mouth daily.        Allergies: No Known Allergies  Family History: History reviewed. No pertinent family history.  Social History: History  Substance Use Topics  . Smoking status: Former Games developer  . Smokeless tobacco: Never Used  . Alcohol Use: No    Review of Systems: 10 Point review of systems was completed and is negative except as noted above.   Physical Exam:   Blood pressure 113/51, pulse 60, temperature 97.8 F (36.6 C), resp. rate 20, height 5\' 9"  (1.753 m), weight 190 lb (86.183 kg).   General appearance: Alert, cooperative,  well nourished, slowed mentation, no distress  Head: Normocephalic, without obvious abnormality, atraumatic Eyes: Conjunctivae injected, cloudy corneas, PERRLA, EOMI Nose: Nares, septum and mucosa are normal, no drainage or sinus tenderness Neck: No adenopathy, supple, symmetrical, trachea midline, thyroid not enlarged, no tenderness Resp: Clear to auscultation bilaterally, diminished bibasilar breath sounds Cardio: Regular rate and rhythm, S1, S2 normal, no murmur, click, rub or gallop, left sided implanted pacemaker w/o evidence of infection GI: Soft, non-tender, distended, normoactive bowel sounds, no organomegaly Extremities: Extremities normal, atraumatic, no cyanosis or edema Lymph nodes: Cervical, supraclavicular, and axillary nodes normal Neurologic: No focal deficits, CN II-XII intact   Laboratory Data: Results for orders placed in visit on 07/22/12 (from the past 48 hour(s))  CBC WITH DIFFERENTIAL     Status: Abnormal   Collection Time   07/22/12  8:37 AM      Component Value Range Comment   WBC 3.9 (*) 4.0 - 10.3 10e3/uL    NEUT# 2.3  1.5 - 6.5 10e3/uL    HGB 11.8 (*) 13.0 - 17.1 g/dL    HCT 16.1 (*) 09.6 - 49.9 %    Platelets 144  140 - 400 10e3/uL    MCV 96.8  79.3 - 98.0 fL    MCH 32.8  27.2 - 33.4 pg    MCHC 33.8  32.0 - 36.0 g/dL    RBC 0.45 (*) 4.09 - 5.82 10e6/uL    RDW 14.5  11.0 - 14.6 %    lymph# 1.0  0.9 - 3.3 10e3/uL    MONO# 0.4  0.1 - 0.9 10e3/uL    Eosinophils Absolute 0.2  0.0 - 0.5 10e3/uL    Basophils Absolute 0.0  0.0 - 0.1 10e3/uL    NEUT% 58.2  39.0 - 75.0 %    LYMPH% 24.9  14.0 - 49.0 %    MONO% 11.3  0.0 - 14.0 %    EOS% 5.1  0.0 - 7.0 %    BASO% 0.5  0.0 - 2.0 %      Imaging Studies: 1. CT scan of the head without IV contrast on 05/28/2011 showed no acute intracranial abnormality.  2. Chest x-ray (2 view) from 12/08/2011 showed COPD and chronic bronchitis/scarring. There were no acute processes. 3. Chest x-ray (2 view) on 03/23/2012 showed  subtle minimally displaced fracture of the posterolateral aspect of the left seventh rib.  No other radiographic evidence of acute cardiopulmonary disease. 4. CT scan of the head without contrast on 05/27/2012 showed mild  changes of chronic microvascular ischemia and generalized cerebral atrophy.  A small remote age lacunar infarct is noted in the left basal ganglia.  Overall no significant changes compared with previous head CT on 05/28/2011.   Impression/Plan: Mr. Largo's hemoglobin and hematocrit are satisfactory. Iron study results are pending at this time. He will remain on oral ferrous sulfate 3 times a day.  Once iron studies are obtained if abnormal, we will make adjustments accordingly. We will check CBC and ferritin in 3 months and plan to see Mr. Lochner again in 6 months at which time we will check CBC, chemistries and iron studies.  The patient and his wife will contact us in the interim if they have any questions or concerns.   Larina Bras, NP-C 07/22/2012, 8:56 AM

## 2012-08-17 ENCOUNTER — Ambulatory Visit (INDEPENDENT_AMBULATORY_CARE_PROVIDER_SITE_OTHER): Payer: Medicare Other | Admitting: Internal Medicine

## 2012-08-17 ENCOUNTER — Encounter: Payer: Self-pay | Admitting: Internal Medicine

## 2012-08-17 VITALS — BP 120/78 | HR 64 | Ht 69.0 in | Wt 189.0 lb

## 2012-08-17 DIAGNOSIS — I10 Essential (primary) hypertension: Secondary | ICD-10-CM

## 2012-08-17 DIAGNOSIS — I495 Sick sinus syndrome: Secondary | ICD-10-CM

## 2012-08-17 DIAGNOSIS — Z950 Presence of cardiac pacemaker: Secondary | ICD-10-CM

## 2012-08-17 LAB — PACEMAKER DEVICE OBSERVATION
AL AMPLITUDE: 3.6 mv
AL IMPEDENCE PM: 367 Ohm
AL THRESHOLD: 0.75 V
BAMS-0001: 150 {beats}/min
BAMS-0003: 60 {beats}/min
BATTERY VOLTAGE: 2.78 V
DEVICE MODEL PM: 1266473
RV LEAD AMPLITUDE: 7.5 mv
RV LEAD IMPEDENCE PM: 418 Ohm
RV LEAD THRESHOLD: 1 V

## 2012-08-17 NOTE — Assessment & Plan Note (Signed)
His blood pressure is very well controlled. Continue current medical therapy.

## 2012-08-17 NOTE — Assessment & Plan Note (Signed)
His [employer] dual-chamber pacemaker is working normally. We'll plan to recheck in several months. 

## 2012-08-17 NOTE — Patient Instructions (Signed)
Your physician wants you to follow-up in: 12 months with Dr. Taylor. You will receive a reminder letter in the mail two months in advance. If you don't receive a letter, please call our office to schedule the follow-up appointment.    

## 2012-08-17 NOTE — Progress Notes (Signed)
HPI John Peck returns today for followup. He is a very pleasant 78 year old man with a history of symptomatic bradycardia, status post permanent pacemaker insertion. He also is hypertension. He has done well in the interim. His wife notes that he is relatively active. He denies syncope or near-syncope. No peripheral edema. No Known Allergies   Current Outpatient Prescriptions  Medication Sig Dispense Refill  . alfuzosin (UROXATRAL) 10 MG 24 hr tablet Take 10 mg by mouth daily.      Marland Kitchen amLODipine (NORVASC) 5 MG tablet Take 10 mg by mouth daily.       Marland Kitchen aspirin 81 MG tablet Take 81 mg by mouth daily.        . carvedilol (COREG) 25 MG tablet Take 25 mg by mouth 2 (two) times daily with a meal.        . clopidogrel (PLAVIX) 75 MG tablet Take 75 mg by mouth daily.        . ferrous sulfate 325 (65 FE) MG tablet Take 325 mg by mouth 3 (three) times daily with meals.       . furosemide (LASIX) 20 MG tablet Take 20 mg by mouth daily.        . isosorbide mononitrate (IMDUR) 60 MG 24 hr tablet Take 60 mg by mouth. Take 90 mg in AM and 30 mg in PM      . KLOR-CON 10 10 MEQ tablet 10 mEq daily.       Marland Kitchen lubiprostone (AMITIZA) 24 MCG capsule Take 24 mcg by mouth daily as needed. For bowel movement      . pantoprazole (PROTONIX) 40 MG tablet Take 40 mg by mouth 2 (two) times daily.      . ranolazine (RANEXA) 500 MG 12 hr tablet Take 1,000 mg by mouth 2 (two) times daily.       . rosuvastatin (CRESTOR) 20 MG tablet Take 20 mg by mouth daily.         Past Medical History  Diagnosis Date  . Hypertension   . Pacemaker   . Coronary atherosclerosis   . Intermediate coronary syndrome   . Arrhythmia     afib, bradycardia  . Diastolic dysfunction   . Anemia     ROS:   All systems reviewed and negative except as noted in the HPI.   Past Surgical History  Procedure Date  . Coronary artery bypass graft 2004     No family history on file.   History   Social History  . Marital Status: Married      Spouse Name: N/A    Number of Children: N/A  . Years of Education: N/A   Occupational History  . Not on file.   Social History Main Topics  . Smoking status: Former Games developer  . Smokeless tobacco: Never Used  . Alcohol Use: No  . Drug Use: Not on file  . Sexually Active: Not on file   Other Topics Concern  . Not on file   Social History Narrative  . No narrative on file     BP 120/78  Pulse 64  Ht 5\' 9"  (1.753 m)  Wt 189 lb (85.73 kg)  BMI 27.91 kg/m2  Physical Exam:  Well appearing 78 year old man who looks younger than his stated age, NAD HEENT: Unremarkable Neck:  7 cm JVD, no thyromegally Lungs:  Clear with no wheezes, rales, or rhonchi. HEART:  Regular rate rhythm, no murmurs, no rubs, no clicks Abd:  soft, positive bowel sounds, no organomegally,  no rebound, no guarding Ext:  2 plus pulses, no edema, no cyanosis, no clubbing Skin:  No rashes no nodules Neuro:  CN II through XII intact, motor grossly intact  DEVICE  Normal device function.  See PaceArt for details.   Assess/Plan:

## 2012-10-21 ENCOUNTER — Other Ambulatory Visit (HOSPITAL_BASED_OUTPATIENT_CLINIC_OR_DEPARTMENT_OTHER): Payer: Medicare Other | Admitting: Lab

## 2012-10-21 DIAGNOSIS — Z862 Personal history of diseases of the blood and blood-forming organs and certain disorders involving the immune mechanism: Secondary | ICD-10-CM

## 2012-10-21 DIAGNOSIS — D509 Iron deficiency anemia, unspecified: Secondary | ICD-10-CM

## 2012-10-21 LAB — CBC WITH DIFFERENTIAL/PLATELET
BASO%: 0.4 % (ref 0.0–2.0)
HCT: 35 % — ABNORMAL LOW (ref 38.4–49.9)
HGB: 12 g/dL — ABNORMAL LOW (ref 13.0–17.1)
MCHC: 34.3 g/dL (ref 32.0–36.0)
MONO#: 0.6 10*3/uL (ref 0.1–0.9)
NEUT%: 56.1 % (ref 39.0–75.0)
RDW: 13.8 % (ref 11.0–14.6)
WBC: 4.3 10*3/uL (ref 4.0–10.3)
lymph#: 1.1 10*3/uL (ref 0.9–3.3)

## 2012-11-17 ENCOUNTER — Encounter: Payer: Self-pay | Admitting: Internal Medicine

## 2012-11-17 DIAGNOSIS — I498 Other specified cardiac arrhythmias: Secondary | ICD-10-CM

## 2013-01-19 ENCOUNTER — Ambulatory Visit (HOSPITAL_COMMUNITY)
Admission: RE | Admit: 2013-01-19 | Discharge: 2013-01-19 | Disposition: A | Discharge status: Home / Self Care | Payer: Medicare Other | Source: Ambulatory Visit | Attending: Cardiology | Admitting: Cardiology

## 2013-01-19 ENCOUNTER — Other Ambulatory Visit (HOSPITAL_COMMUNITY): Payer: Self-pay | Admitting: Cardiology

## 2013-01-19 DIAGNOSIS — W19XXXA Unspecified fall, initial encounter: Secondary | ICD-10-CM | POA: Insufficient documentation

## 2013-01-19 DIAGNOSIS — R52 Pain, unspecified: Secondary | ICD-10-CM

## 2013-01-19 DIAGNOSIS — R079 Chest pain, unspecified: Secondary | ICD-10-CM | POA: Insufficient documentation

## 2013-01-19 DIAGNOSIS — J4489 Other specified chronic obstructive pulmonary disease: Secondary | ICD-10-CM | POA: Insufficient documentation

## 2013-01-19 DIAGNOSIS — J449 Chronic obstructive pulmonary disease, unspecified: Secondary | ICD-10-CM | POA: Insufficient documentation

## 2013-01-20 ENCOUNTER — Other Ambulatory Visit: Payer: Self-pay | Admitting: Medical Oncology

## 2013-01-20 ENCOUNTER — Other Ambulatory Visit (HOSPITAL_BASED_OUTPATIENT_CLINIC_OR_DEPARTMENT_OTHER): Payer: Medicare Other

## 2013-01-20 ENCOUNTER — Ambulatory Visit (HOSPITAL_BASED_OUTPATIENT_CLINIC_OR_DEPARTMENT_OTHER): Payer: Medicare Other | Admitting: Oncology

## 2013-01-20 ENCOUNTER — Telehealth: Payer: Self-pay | Admitting: Oncology

## 2013-01-20 ENCOUNTER — Encounter: Payer: Self-pay | Admitting: Oncology

## 2013-01-20 VITALS — BP 124/66 | HR 99 | Temp 98.1°F | Resp 12 | Ht 69.0 in | Wt 186.2 lb

## 2013-01-20 DIAGNOSIS — J449 Chronic obstructive pulmonary disease, unspecified: Secondary | ICD-10-CM

## 2013-01-20 DIAGNOSIS — R079 Chest pain, unspecified: Secondary | ICD-10-CM

## 2013-01-20 DIAGNOSIS — D509 Iron deficiency anemia, unspecified: Secondary | ICD-10-CM

## 2013-01-20 DIAGNOSIS — Z862 Personal history of diseases of the blood and blood-forming organs and certain disorders involving the immune mechanism: Secondary | ICD-10-CM

## 2013-01-20 DIAGNOSIS — I10 Essential (primary) hypertension: Secondary | ICD-10-CM

## 2013-01-20 LAB — CBC WITH DIFFERENTIAL/PLATELET
Basophils Absolute: 0 10*3/uL (ref 0.0–0.1)
Eosinophils Absolute: 0.1 10*3/uL (ref 0.0–0.5)
HGB: 12.2 g/dL — ABNORMAL LOW (ref 13.0–17.1)
MCV: 95 fL (ref 79.3–98.0)
MONO#: 0.5 10*3/uL (ref 0.1–0.9)
MONO%: 13.1 % (ref 0.0–14.0)
NEUT#: 2.3 10*3/uL (ref 1.5–6.5)
RBC: 3.76 10*6/uL — ABNORMAL LOW (ref 4.20–5.82)
RDW: 14.4 % (ref 11.0–14.6)
WBC: 4.1 10*3/uL (ref 4.0–10.3)
lymph#: 1.1 10*3/uL (ref 0.9–3.3)

## 2013-01-20 LAB — COMPREHENSIVE METABOLIC PANEL (CC13)
Albumin: 3.5 g/dL (ref 3.5–5.0)
BUN: 15.7 mg/dL (ref 7.0–26.0)
CO2: 26 mEq/L (ref 22–29)
Calcium: 8.9 mg/dL (ref 8.4–10.4)
Chloride: 109 mEq/L — ABNORMAL HIGH (ref 98–107)
Glucose: 114 mg/dl — ABNORMAL HIGH (ref 70–99)
Potassium: 3.7 mEq/L (ref 3.5–5.1)
Sodium: 142 mEq/L (ref 136–145)
Total Protein: 7.5 g/dL (ref 6.4–8.3)

## 2013-01-20 LAB — FERRITIN: Ferritin: 144 ng/mL (ref 22–322)

## 2013-01-20 LAB — IRON AND TIBC: Iron: 49 ug/dL (ref 42–165)

## 2013-01-20 MED ORDER — HYDROCODONE-ACETAMINOPHEN 5-325 MG PO TABS: 1.0000 | ORAL_TABLET | Freq: Four times a day (QID) | ORAL | Status: DC | PRN

## 2013-01-20 NOTE — Progress Notes (Signed)
This office note has been dictated.  #[id number]

## 2013-01-20 NOTE — Telephone Encounter (Signed)
gv and printed appt schedule for pt for June and Sept

## 2013-01-20 NOTE — Progress Notes (Signed)
CC:   John Peck, M.D. John Friar, MD John Peck, M.D.  PROBLEM LIST:  1. Iron deficiency anemia dating back to April 2007. The patient is  currently on oral iron.Stools were Hemoccult positive x3 on 03/30/2011.  He had an endoscopy and colonoscopy by Dr. Charlott Peck on 04/20/2011. There were no significant abnormalities.   2. Coronary artery disease status post CABG and status post coronary  artery stents.  3. Permanent dual chamber pacemaker placed in 1989.  4. Hypertension.  5. Dyslipidemia.  6. BPH status post TURP in 1992.  7. History of liver abscess. 8. COPD.  MEDICATIONS:  Reviewed and recorded. Current Outpatient Prescriptions  Medication Sig Dispense Refill  . alfuzosin (UROXATRAL) 10 MG 24 hr tablet Take 10 mg by mouth daily.      Marland Kitchen amLODipine (NORVASC) 5 MG tablet Take 10 mg by mouth daily.       Marland Kitchen aspirin 81 MG tablet Take 81 mg by mouth daily.        . carvedilol (COREG) 25 MG tablet Take 25 mg by mouth 2 (two) times daily with a meal.        . clopidogrel (PLAVIX) 75 MG tablet Take 75 mg by mouth daily.        . ferrous sulfate 325 (65 FE) MG tablet Take 325 mg by mouth 3 (three) times daily with meals.       . furosemide (LASIX) 20 MG tablet Take 20 mg by mouth daily.        . isosorbide mononitrate (IMDUR) 60 MG 24 hr tablet 60mg   mg in AM and 30 mg in PM      . KLOR-CON 10 10 MEQ tablet 10 mEq daily.       Marland Kitchen lubiprostone (AMITIZA) 24 MCG capsule Take 24 mcg by mouth daily as needed. For bowel movement      . pantoprazole (PROTONIX) 40 MG tablet Take 40 mg by mouth 2 (two) times daily.      . ranolazine (RANEXA) 500 MG 12 hr tablet Take 1,000 mg by mouth 2 (two) times daily.       . rosuvastatin (CRESTOR) 20 MG tablet Take 20 mg by mouth daily.      Marland Kitchen HYDROcodone-acetaminophen (NORCO/VICODIN) 5-325 MG per tablet Take 1 tablet by mouth every 6 (six) hours as needed for pain.  30 tablet  0   No current facility-administered medications for  this visit.    SMOKING HISTORY:  The patient is a former cigarette smoker.   HISTORY:  I saw John Peck today for followup of his history of iron deficiency anemia, currently on ferrous sulfate 325 mg 3 times daily. The patient says he is being compliant with this.  John Peck was last seen by [place] on 07/22/2012 and prior to that on 01/15/2012.  He is accompanied by his wife, John Peck.  The patient's main problem today is that he fell recently in his home.  He uses a cane.  He is having pain in his right ribs and is requesting some pain medicine. He did have a chest x-ray on 01/19/2013 that showed COPD but no obvious fractures or acute disease.  The patient denies any obvious blood in his stools.  He is on iron.  His stools are black.  He denies any pagophagia.  His overall condition has been generally stable, without any surgery or hospitalizations over the past year.  PHYSICAL EXAMINATION:  John Peck is an elderly gentleman, somewhat frail.  His station and gait is unstable.  He uses a cane.  Weight is 186 pounds 3.2 ounces, weight is stable.  Height 5 feet 9 inches.  Body surface area 2.03 sq m.  Blood pressure 124/66.  Other vital signs are normal.  There is no scleral icterus.  Mouth and pharynx are benign. The patient wears glasses.  Heart and lungs are normal.  Rhythm is regular.  He has a left-sided pacemaker.  He is tender over his right ribs.  Abdomen is firm with no organomegaly or masses palpable. Extremities, no peripheral edema or clubbing.  Neurologic exam is nonfocal.  LABORATORY DATA:  White count 4.1, ANC 2.3, hemoglobin 12.2, hematocrit 35.7, platelets 134,000.  Chemistries today were essentially normal. Glucose was 114.  Iron studies today are pending.  Ferritin on 10/21/2012 was 88.  On 07/22/2012 ferritin was 130 with an iron saturation of 25%.  As noted above stools were tested for occult blood back on 03/30/2011 and were positive x3.    IMAGING  STUDIES:  1. CT scan of the head without IV contrast on 05/28/2011 showed no  acute intracranial abnormality.  2. Chest x-ray (2 view) from 12/08/2011 showed COPD and chronic  bronchitis/scarring. There were no acute processes. 3. Chest x-ray, 2 view, on 03/23/2012; also 2 views of the left ribs showed subtle minimally displaced fracture of the posterolateral aspect of the left 7th rib.  The patient is status post median sternotomy for CABG.  A left-sided pacemaker device is in place. No acute cardiopulmonary disease was present. 4. CT scan of the head without IV contrast was carried out on 05/26/2012. Interpretation was provided by [facility].  This interpretation is not available at this time. 5. Chest x-ray, 2 view, on 01/19/2013 showed COPD/chronic changes.  No active disease.   IMPRESSION AND PLAN: John Peck's hemoglobin and hematocrit and previous iron studies remain stable and satisfactory.  I have recommended that John Peck stay on ferrous sulfate 3 times daily.  We are awaiting today's iron study results.  We will plan to check a CBC and ferritin in 3 months and plan to see John Peck again in 6 months at which time we will check CBC, chemistries and iron studies.    ______________________________ Samul Dada, M.D. DSM/MEDQ  D:  01/20/2013  T:  01/20/2013  Job:  [id number]

## 2013-02-22 DIAGNOSIS — I498 Other specified cardiac arrhythmias: Secondary | ICD-10-CM

## 2013-03-24 ENCOUNTER — Encounter: Payer: Self-pay | Admitting: Cardiovascular Disease

## 2013-03-24 ENCOUNTER — Ambulatory Visit (INDEPENDENT_AMBULATORY_CARE_PROVIDER_SITE_OTHER): Payer: Medicare Other | Admitting: Cardiology

## 2013-03-24 ENCOUNTER — Encounter: Payer: Self-pay | Admitting: Internal Medicine

## 2013-03-24 ENCOUNTER — Encounter: Payer: Self-pay | Admitting: Cardiology

## 2013-03-24 VITALS — BP 104/70 | HR 60 | Ht 69.0 in | Wt 186.9 lb

## 2013-03-24 DIAGNOSIS — R600 Localized edema: Secondary | ICD-10-CM | POA: Insufficient documentation

## 2013-03-24 DIAGNOSIS — I480 Paroxysmal atrial fibrillation: Secondary | ICD-10-CM | POA: Insufficient documentation

## 2013-03-24 DIAGNOSIS — I4891 Unspecified atrial fibrillation: Secondary | ICD-10-CM

## 2013-03-24 DIAGNOSIS — I10 Essential (primary) hypertension: Secondary | ICD-10-CM

## 2013-03-24 DIAGNOSIS — Z950 Presence of cardiac pacemaker: Secondary | ICD-10-CM

## 2013-03-24 DIAGNOSIS — R609 Edema, unspecified: Secondary | ICD-10-CM

## 2013-03-24 DIAGNOSIS — Z951 Presence of aortocoronary bypass graft: Secondary | ICD-10-CM

## 2013-03-24 DIAGNOSIS — I25810 Atherosclerosis of coronary artery bypass graft(s) without angina pectoris: Secondary | ICD-10-CM

## 2013-03-24 NOTE — Assessment & Plan Note (Signed)
Pt admits to fairly high salt intake

## 2013-03-24 NOTE — Assessment & Plan Note (Signed)
A-V paced today

## 2013-03-24 NOTE — Assessment & Plan Note (Signed)
No angina, trouble affording Crestor and Ranexa

## 2013-03-24 NOTE — Assessment & Plan Note (Signed)
controlled 

## 2013-03-24 NOTE — Progress Notes (Signed)
03/24/2013 John Peck   1924/10/14  [id number]  Primary Physicia John Corn, MD Primary Cardiologist: Dr John Peck  HPI:  The patient presents to the office today for cardiologic followup evaluation. The patient is a very pleasant 79 year old gentleman who underwent CABG revascularization surgery in 1989 and had a LIMA to his LAD, a vein to the circumflex. In 2004 he underwent stenting to his right coronary artery. His last catheterization was done in January 2013. The findings are well outlined in my March 01, 2012, note. He did have a patent stent in the proximal RCA with a 90% stenosis in the anterior RV marginal branch arising from the mid RCA, and distal 70% to 80% narrowing beyond the crux with 70% to 80% stenosis after the PDA takeoff. He had a patent LIMA to his LAD. He is status post permanent pacemaker insertion in 2009. He has history of atrial fibrillation, sick sinus syndrome.         He has done well since Dr John Peck saw him 11/14. He does say he has had some LE edema. He denies orthopnea or unusual DOE. He admits he does add salt to his diet. He denies angina. He and his wife are concerned about the cost of they're medications. Theyu are afraid they are going to be in the doughnut hole and not be able to afford Ranexa and Crestor.    Current Outpatient Prescriptions  Medication Sig Dispense Refill  . alfuzosin (UROXATRAL) 10 MG 24 hr tablet Take 10 mg by mouth daily.      Marland Kitchen amLODipine (NORVASC) 5 MG tablet Take 10 mg by mouth daily.       Marland Kitchen aspirin 81 MG tablet Take 81 mg by mouth daily.        . carvedilol (COREG) 25 MG tablet Take 25 mg by mouth 2 (two) times daily with a meal.        . clopidogrel (PLAVIX) 75 MG tablet Take 75 mg by mouth daily.        . ferrous sulfate 325 (65 FE) MG tablet Take 325 mg by mouth 3 (three) times daily with meals.       . furosemide (LASIX) 20 MG tablet Take 20 mg by mouth daily.        Marland Kitchen HYDROcodone-acetaminophen (NORCO/VICODIN) 5-325  MG per tablet Take 1 tablet by mouth every 6 (six) hours as needed for pain.  30 tablet  0  . isosorbide mononitrate (IMDUR) 60 MG 24 hr tablet 60mg   mg in AM and 30 mg in PM      . KLOR-CON 10 10 MEQ tablet 10 mEq daily.       Marland Kitchen lubiprostone (AMITIZA) 24 MCG capsule Take 24 mcg by mouth daily as needed. For bowel movement      . pantoprazole (PROTONIX) 40 MG tablet Take 40 mg by mouth 2 (two) times daily.      . ranolazine (RANEXA) 500 MG 12 hr tablet Take 1,000 mg by mouth 2 (two) times daily.       . rosuvastatin (CRESTOR) 20 MG tablet Take 20 mg by mouth daily.      Marland Kitchen NITROSTAT 0.4 MG SL tablet Take 0.4 mg by mouth as needed.       No current facility-administered medications for this visit.    No Known Allergies  History   Social History  . Marital Status: Married    Spouse Name: N/A    Number of Children: 5  . Years of Education:  N/A   Occupational History  . RETIRED     TRUCK DRIVER   Social History Main Topics  . Smoking status: Former Smoker -- 2.00 packs/day for 65 years    Types: Cigarettes  . Smokeless tobacco: Former Neurosurgeon    Quit date: 10/29/2008  . Alcohol Use: No  . Drug Use: No  . Sexually Active: Not on file   Other Topics Concern  . Not on file   Social History Narrative  . No narrative on file     Review of Systems: General: negative for chills, fever, night sweats or weight changes.  Cardiovascular: negative for chest pain, dyspnea on exertion, edema, orthopnea, palpitations, paroxysmal nocturnal dyspnea or shortness of breath Dermatological: negative for rash Respiratory: negative for cough or wheezing Urologic: negative for hematuria Abdominal: negative for nausea, vomiting, diarrhea, bright red blood per rectum, melena, or hematemesis Neurologic: negative for visual changes, syncope, or dizziness All other systems reviewed and are otherwise negative except as noted above.    Blood pressure 104/70, pulse 60, height 5\' 9"  (1.753 m), weight  186 lb 14.4 oz (84.777 kg).  General appearance: alert, cooperative and no distress Lungs: clear to auscultation bilaterally Heart: regular rate and rhythm Extrem: Trace edema Rt > Lt  EKG  EKG: AV paced.  ASSESSMENT AND PLAN:   S/P CABG x 2 '89. RCA stent'04. last cath Jan 2013 No angina, trouble affording Crestor and Ranexa  HYPERTENSION controlled  PACEMAKER, PERMANENT- [employer] Sept 2009 Pacing  PAF (paroxysmal atrial fibrillation) A-V paced today  Lower extremity edema Pt admits to fairly high salt intake   PLAN  Reassured we will try and provide samples if needed. I counsled him on low salt diet. F/U with Dr John Peck 6 mos.   John Peck KPA-C 03/24/2013 1:55 PM

## 2013-03-24 NOTE — Patient Instructions (Addendum)
Low salt diet, elevate your feet as much as possible.

## 2013-03-24 NOTE — Assessment & Plan Note (Signed)
Pacing  

## 2013-04-21 ENCOUNTER — Other Ambulatory Visit (HOSPITAL_BASED_OUTPATIENT_CLINIC_OR_DEPARTMENT_OTHER): Payer: Medicare Other

## 2013-04-21 DIAGNOSIS — Z862 Personal history of diseases of the blood and blood-forming organs and certain disorders involving the immune mechanism: Secondary | ICD-10-CM

## 2013-04-21 DIAGNOSIS — D649 Anemia, unspecified: Secondary | ICD-10-CM

## 2013-04-21 LAB — CBC WITH DIFFERENTIAL/PLATELET
BASO%: 0.5 % (ref 0.0–2.0)
Basophils Absolute: 0 10*3/uL (ref 0.0–0.1)
EOS%: 3.7 % (ref 0.0–7.0)
HCT: 35.6 % — ABNORMAL LOW (ref 38.4–49.9)
HGB: 12.1 g/dL — ABNORMAL LOW (ref 13.0–17.1)
LYMPH%: 25.1 % (ref 14.0–49.0)
MCH: 32.6 pg (ref 27.2–33.4)
MCHC: 34.1 g/dL (ref 32.0–36.0)
MONO#: 0.6 10*3/uL (ref 0.1–0.9)
NEUT%: 56.8 % (ref 39.0–75.0)
Platelets: 134 10*3/uL — ABNORMAL LOW (ref 140–400)
lymph#: 1 10*3/uL (ref 0.9–3.3)

## 2013-05-16 ENCOUNTER — Telehealth: Payer: Self-pay | Admitting: Cardiovascular Disease

## 2013-05-16 MED ORDER — RANOLAZINE ER 500 MG PO TB12: 1000.0000 mg | ORAL_TABLET | Freq: Two times a day (BID) | ORAL | Status: DC

## 2013-05-16 MED ORDER — RANOLAZINE ER 1000 MG PO TB12: 1000.0000 mg | ORAL_TABLET | Freq: Two times a day (BID) | ORAL | Status: DC

## 2013-05-16 NOTE — Telephone Encounter (Signed)
Need some samples of Ranexa please. °

## 2013-05-16 NOTE — Telephone Encounter (Signed)
Samples left at front desk for pt pick up.  Lot: [id number] Exp: 08/2016.  Returned call and wife informed samples left at front desk.  Verbalized understanding and agreed w/ plan.

## 2013-05-24 ENCOUNTER — Encounter: Payer: Self-pay | Admitting: Internal Medicine

## 2013-05-24 DIAGNOSIS — I498 Other specified cardiac arrhythmias: Secondary | ICD-10-CM

## 2013-06-08 ENCOUNTER — Telehealth: Payer: Self-pay | Admitting: Cardiovascular Disease

## 2013-06-08 NOTE — Telephone Encounter (Signed)
She want you(John Peck) to call her-question about her husband medicine.

## 2013-06-09 ENCOUNTER — Telehealth: Payer: Self-pay | Admitting: *Deleted

## 2013-06-09 ENCOUNTER — Telehealth: Payer: Self-pay | Admitting: Cardiovascular Disease

## 2013-06-09 MED ORDER — RANOLAZINE ER 1000 MG PO TB12: 1000.0000 mg | ORAL_TABLET | Freq: Two times a day (BID) | ORAL | Status: DC

## 2013-06-09 NOTE — Telephone Encounter (Signed)
John Peck states that John Peck has almost reached the donut hole for his medications for this year.  Can they re apply for MAP for Ranexa?

## 2013-06-09 NOTE — Telephone Encounter (Signed)
Returning a call from yesterday. Call back if assistance still needed.

## 2013-06-09 NOTE — Telephone Encounter (Signed)
Patient's wife called back to request samples of Ranexa 1000 mg.  # [phone] provided Left at the front desk for pickup.

## 2013-06-19 NOTE — Telephone Encounter (Signed)
Spoke with wife, she is to call once he is in donut hole and we will re-submit his application at that time.

## 2013-06-30 ENCOUNTER — Telehealth: Payer: Self-pay | Admitting: *Deleted

## 2013-06-30 NOTE — Telephone Encounter (Signed)
Message forwarded to W. Waddell, CMA.  

## 2013-06-30 NOTE — Telephone Encounter (Signed)
Patient's wife phoned to inform me that the patient is in the "donut hole" and will not be able to cover the cost of his Ranexa. Looked for samples and we were out. Gave patient's wife the [facility] [employer] 800 # to call and find out what they need to do to get some assistance from the company. She will call back the middle of next week to see if samples are available.

## 2013-06-30 NOTE — Telephone Encounter (Signed)
She want you(John Peck) to call her-question about her husband medicine.  Pt is in a donut hole and needs to find out what to do about his medication

## 2013-07-04 ENCOUNTER — Other Ambulatory Visit: Payer: Self-pay | Admitting: Cardiovascular Disease

## 2013-07-04 ENCOUNTER — Telehealth: Payer: Self-pay | Admitting: Cardiovascular Disease

## 2013-07-04 NOTE — Telephone Encounter (Signed)
Returned call and spoke w/ Irving Burton, pt's wife.  Informed no samples of Ranexa available.  Verbalized understanding and stated she has a number she can call or the office can call.  Wife advised to call the number for assistance with getting samples and call back if anything needed from office.  Verbalized understanding.  Message forwarded to K. Alvstad, PharmD, who may be able to help with getting assistance with Ranexa.

## 2013-07-04 NOTE — Telephone Encounter (Signed)
Pt needs Renexa samples.

## 2013-07-05 NOTE — Telephone Encounter (Signed)
Medication assistance to [employer] faxed today

## 2013-07-11 ENCOUNTER — Other Ambulatory Visit: Payer: Self-pay | Admitting: *Deleted

## 2013-07-11 MED ORDER — RANOLAZINE ER 1000 MG PO TB12: 1000.0000 mg | ORAL_TABLET | Freq: Two times a day (BID) | ORAL | Status: DC

## 2013-07-11 NOTE — Telephone Encounter (Signed)
Rx was sent to pharmacy electronically. 

## 2013-07-20 ENCOUNTER — Other Ambulatory Visit: Payer: Self-pay | Admitting: Medical Oncology

## 2013-07-20 DIAGNOSIS — Z862 Personal history of diseases of the blood and blood-forming organs and certain disorders involving the immune mechanism: Secondary | ICD-10-CM

## 2013-07-21 ENCOUNTER — Other Ambulatory Visit (HOSPITAL_BASED_OUTPATIENT_CLINIC_OR_DEPARTMENT_OTHER): Payer: Medicare Other | Admitting: Lab

## 2013-07-21 ENCOUNTER — Telehealth: Payer: Self-pay | Admitting: Internal Medicine

## 2013-07-21 ENCOUNTER — Ambulatory Visit (HOSPITAL_BASED_OUTPATIENT_CLINIC_OR_DEPARTMENT_OTHER): Payer: Medicare Other | Admitting: Internal Medicine

## 2013-07-21 VITALS — BP 131/59 | HR 57 | Temp 98.6°F | Resp 16 | Ht 69.0 in | Wt 184.7 lb

## 2013-07-21 DIAGNOSIS — Z862 Personal history of diseases of the blood and blood-forming organs and certain disorders involving the immune mechanism: Secondary | ICD-10-CM

## 2013-07-21 DIAGNOSIS — D539 Nutritional anemia, unspecified: Secondary | ICD-10-CM

## 2013-07-21 DIAGNOSIS — D509 Iron deficiency anemia, unspecified: Secondary | ICD-10-CM

## 2013-07-21 LAB — COMPREHENSIVE METABOLIC PANEL (CC13)
AST: 11 U/L (ref 5–34)
Albumin: 3.5 g/dL (ref 3.5–5.0)
BUN: 16.7 mg/dL (ref 7.0–26.0)
Calcium: 9.3 mg/dL (ref 8.4–10.4)
Chloride: 108 mEq/L (ref 98–109)
Creatinine: 1.2 mg/dL (ref 0.7–1.3)
Glucose: 90 mg/dl (ref 70–140)
Potassium: 4.3 mEq/L (ref 3.5–5.1)

## 2013-07-21 LAB — CBC WITH DIFFERENTIAL/PLATELET
Basophils Absolute: 0 10*3/uL (ref 0.0–0.1)
EOS%: 2.9 % (ref 0.0–7.0)
Eosinophils Absolute: 0.1 10*3/uL (ref 0.0–0.5)
HCT: 37 % — ABNORMAL LOW (ref 38.4–49.9)
HGB: 12.6 g/dL — ABNORMAL LOW (ref 13.0–17.1)
MCH: 32.2 pg (ref 27.2–33.4)
MCV: 94.6 fL (ref 79.3–98.0)
NEUT#: 2.4 10*3/uL (ref 1.5–6.5)
NEUT%: 56.3 % (ref 39.0–75.0)
lymph#: 1.1 10*3/uL (ref 0.9–3.3)

## 2013-07-21 LAB — IRON AND TIBC CHCC
Iron: 86 ug/dL (ref 42–163)
TIBC: 233 ug/dL (ref 202–409)
UIBC: 146 ug/dL (ref 117–376)

## 2013-07-21 LAB — FERRITIN CHCC: Ferritin: 149 ng/ml (ref 22–316)

## 2013-07-21 NOTE — Patient Instructions (Addendum)
Iron Chewable Tablets What is this medicine? IRON (AHY ern) replaces iron that is essential to healthy red blood cells. Iron is used to treat iron deficiency anemia. Anemia may cause problems like tiredness, shortness of breath, or slowed growth in children. Only take iron if your doctor has told you to. Do not treat yourself with iron if you are feeling tired. Most healthy people get enough iron in their diets, particularly if they eat cereals, meat, poultry, and fish. This medicine may be used for other purposes; ask your health care provider or pharmacist if you have questions. What should I tell my health care provider before I take this medicine? They need to know if you have any of these conditions: -frequently drink alcohol -bowel disease -hemolytic anemia -iron overload (hemochromatosis, hemosiderosis) -liver disease -problems with swallowing -stomach ulcer or other stomach problems -an unusual or allergic reaction to iron, other medicines, foods, dyes, or preservatives -pregnant or trying to get pregnant -breast-feeding How should I use this medicine? Take this medicine by mouth. Chew it completely before swallowing. Follow the directions on the prescription label. Take this medicine in an upright or sitting position. Try to take any bedtime doses at least 10 minutes before lying down. You may take this medicine with food. Take your medicine at regular intervals. Do not take your medicine more often than directed. Do not stop taking except on your doctor's advice. Talk to your pediatrician regarding the use of this medicine in children. While this drug may be prescribed for even very young children for selected conditions, precautions do apply. Overdosage: If you think you have taken too much of this medicine contact a poison control center or emergency room at once. NOTE: This medicine is only for you. Do not share this medicine with others. What if I miss a dose? If you miss a dose,  take it as soon as you can. If it is almost time for your next dose, take only that dose. Do not take double or extra doses. What may interact with this medicine? If you are taking this iron product, you should not take iron in any other medicine or dietary supplement. This medicine may also interact with the following medications: -alendronate -antacids -cefdinir -chloramphenicol -cholestyramine -deferoxamine -dimercaprol -etidronate -medicines for stomach ulcers or other stomach problems -pancreatic enzymes -quinolone antibiotics (examples: Cipro, Floxin, Levaquin, Tequin and others) -risedronate -tetracycline antibiotics (examples: doxycycline, tetracycline, minocycline, and others) -thyroid hormones This list may not describe all possible interactions. Give your health care provider a list of all the medicines, herbs, non-prescription drugs, or dietary supplements you use. Also tell them if you smoke, drink alcohol, or use illegal drugs. Some items may interact with your medicine. What should I watch for while using this medicine? Use iron supplements only as directed by your health care professional. You will need important blood work while you are taking this medicine. It may take 3 to 6 months of therapy to treat low iron levels. Pregnant women should follow the dose and length of iron treatment as directed by their doctors. Do not use iron longer than prescribed, and do not take a higher dose than recommended. Long-term use may cause excess iron to build-up in the body. Do not take iron with antacids. If you need to take an antacid, take it 2 hours after a dose of iron. What side effects may I notice from receiving this medicine? Side effects that you should report to your doctor or health care professional as soon   as possible: -allergic reactions like skin rash, itching or hives, swelling of the face, lips, or tongue -blue lips, nails, or palms -dark colored stools (this may be  due to the iron, but can indicate a more serious condition) -drowsiness -pain with or difficulty swallowing -pale or clammy skin -seizures -stomach pain -unusually weak or tired -vomiting -weak, fast, or irregular heartbeat Side effects that usually do not require medical attention (report to your doctor or health care professional if they continue or are bothersome): -constipation -indigestion -nausea or stomach upset This list may not describe all possible side effects. Call your doctor for medical advice about side effects. You may report side effects to FDA at 1-800-FDA-1088. Where should I keep my medicine? Keep out of the reach of children. Even small amounts of iron can be harmful to a child. Store at room temperature between 15 and 30 degrees C (59 and 86 degrees F). Keep container tightly closed. Throw away any unused medicine after the expiration date. NOTE: This sheet is a summary. It may not cover all possible information. If you have questions about this medicine, talk to your doctor, pharmacist, or health care provider.  2012, Elsevier/Gold Standard. (03/15/2008 5:08:28 PM)Iron Deficiency Anemia There are many types of anemia. Iron deficiency anemia is the most common. Iron deficiency anemia is a decrease in the number of red blood cells caused by too little iron. Without enough iron, your body does not produce enough hemoglobin. Hemoglobin is a substance in red blood cells that carries oxygen to the body's tissues. Iron deficiency anemia may leave you tired and short of breath. CAUSES   Lack of iron in the diet.  This may be seen in infants and children, because there is little iron in milk.  This may be seen in adults who do not eat enough iron-rich foods.  This may be seen in pregnant or breastfeeding women who do not take iron supplements. There is a much higher need for iron intake at these times.  Poor absorption of iron, as seen with intestinal  disorders.  Intestinal bleeding.  Heavy periods. SYMPTOMS  Mild anemia may not be noticeable. Symptoms may include:  Fatigue.  Headache.  Pale skin.  Weakness.  Shortness of breath.  Dizziness.  Cold hands and feet.  Fast or irregular heartbeat. DIAGNOSIS  Diagnosis requires a thorough evaluation and physical exam by your caregiver.  Blood tests are generally used to confirm iron deficiency anemia.  Additional tests may be done to find the underlying cause of your anemia. These may include:  Testing for blood in the stool (fecal occult blood test).  A procedure to see inside the colon and rectum (colonoscopy).  A procedure to see inside the esophagus and stomach (endoscopy). TREATMENT   Correcting the cause of the iron deficiency is the first step.  Medicines, such as oral contraceptives, can make heavy menstrual flows lighter.  Antibiotics and other medicines can be used to treat peptic ulcers.  Surgery may be needed to remove a bleeding polyp, tumor, or fibroid.  Often, iron supplements (ferrous sulfate) are taken.  For the best iron absorption, take these supplements with an empty stomach.  You may need to take the supplements with food if you cannot tolerate them on an empty stomach. Vitamin C improves the absorption of iron. Your caregiver may recommend taking your iron tablets with a glass of orange juice or vitamin C supplement.  Milk and antacids should not be taken at the same time as iron   supplements. They may interfere with the absorption of iron.  Iron supplements can cause constipation. A stool softener is often recommended.  Pregnant and breastfeeding women will need to take extra iron, because their normal diet usually will not provide the required amount.  Patients who cannot tolerate iron by mouth can take it through a vein (intravenously) or by an injection into the muscle. HOME CARE INSTRUCTIONS   Ask your dietitian for help with diet  questions.  Take iron and vitamins as directed by your caregiver.  Eat a diet rich in iron. Eat liver, lean beef, whole-grain bread, eggs, dried fruit, and dark green leafy vegetables. SEEK IMMEDIATE MEDICAL CARE IF:   You have a fainting episode. Do not drive yourself. Call your local emergency services (911 in [place] if no other help is available.  You have chest pain, nausea, or vomiting.  You develop severe or increased shortness of breath with activities.  You develop weakness or increased thirst.  You have a rapid heartbeat.  You develop unexplained sweating or become lightheaded when getting up from a chair or bed. MAKE SURE YOU:   Understand these instructions.  Will watch your condition.  Will get help right away if you are not doing well or get worse. Document Released: 10/23/2000 Document Revised: 01/18/2012 Document Reviewed: 03/04/2010 [employer] Patient Information 2014 [employer].  

## 2013-07-21 NOTE — Telephone Encounter (Signed)
Gave pt appt for lab and MD on March 2015, draw labs in December

## 2013-07-24 NOTE — Progress Notes (Signed)
CC:   John Peck, M.D. John Friar, MD Nicki Guadalajara, M.D.  PROBLEM LIST:  1. Iron deficiency anemia dating back to April 2007. The patient is currently on oral iron.Stools were Hemoccult positive x3 on 03/30/2011.  He had an endoscopy and colonoscopy by Dr. Charlott Rakes on 04/20/2011. There were no significant abnormalities.  2. Coronary artery disease status post CABG and status post coronary  artery stents.  3. Permanent dual chamber pacemaker placed in 1989.  4. Hypertension.  5. Dyslipidemia.  6. BPH status post TURP in 1992.  7. History of liver abscess. 8. COPD.  MEDICATIONS:  Reviewed and recorded. Current Outpatient Prescriptions  Medication Sig Dispense Refill  . alfuzosin (UROXATRAL) 10 MG 24 hr tablet Take 10 mg by mouth daily.      Marland Kitchen amLODipine (NORVASC) 5 MG tablet Take 10 mg by mouth daily.       Marland Kitchen aspirin 81 MG tablet Take 81 mg by mouth daily.        . carvedilol (COREG) 25 MG tablet Take 25 mg by mouth 2 (two) times daily with a meal.        . clopidogrel (PLAVIX) 75 MG tablet Take 75 mg by mouth daily.        . ferrous sulfate 325 (65 FE) MG tablet Take 325 mg by mouth 3 (three) times daily with meals.       . furosemide (LASIX) 20 MG tablet Take 20 mg by mouth daily.        Marland Kitchen HYDROcodone-acetaminophen (NORCO/VICODIN) 5-325 MG per tablet Take 1 tablet by mouth every 6 (six) hours as needed for pain.  30 tablet  0  . isosorbide mononitrate (IMDUR) 60 MG 24 hr tablet       . KLOR-CON 10 10 MEQ tablet 10 mEq daily.       Marland Kitchen lubiprostone (AMITIZA) 24 MCG capsule Take 24 mcg by mouth daily as needed. For bowel movement      . NITROSTAT 0.4 MG SL tablet Take 0.4 mg by mouth as needed.      . pantoprazole (PROTONIX) 40 MG tablet Take 40 mg by mouth 2 (two) times daily.      . ranolazine (RANEXA) 1000 MG SR tablet Take 1 tablet (1,000 mg total) by mouth 2 (two) times daily.  180 tablet  2  . rosuvastatin (CRESTOR) 20 MG tablet Take 20 mg by mouth daily.        No current facility-administered medications for this visit.    SMOKING HISTORY:  The patient is a former cigarette smoker.   HISTORY: John Peck was last seen by Dr. Vedia Pereyra on 01/20/2013.  He is accompanied by his wife, John Peck.   He denies any pagophagia.  His overall condition has been generally stable, without any surgery or hospitalizations over the past year. He reports Feeling pretty good.  He is without chest pain.   He has required no blood transfusions.   PHYSICAL EXAMINATION:  BP 131/59  Pulse 57  Temp(Src) 98.6 F (37 C) (Oral)  Resp 16  Ht 5\' 9"  (1.753 m)  Wt 184 lb 11.2 oz (83.779 kg)  BMI 27.26 kg/m2  SpO2 97% Gen: John Peck is an elderly gentleman, somewhat frail.  His station and gait is unstable.  He uses a cane.   HEENT: There is no scleral icterus.  Mouth and pharynx are benign. The patient wears glasses.   Heart: RR;S1S2 present; No M/R/G. Lung: CTAB/L; No wheezes or rhonchi. Abdomen: S/NT/ND  +  BS; No oranomegaly or masses palpable. Ext: No peripheral edema Neuro: Non focal.   LABORATORY DATA:   CBC    Component Value Date/Time   WBC 4.2 07/21/2013 0915   WBC 6.2 06/01/2011 0420   RBC 3.92* 07/21/2013 0915   RBC 3.77* 06/01/2011 0420   HGB 12.6* 07/21/2013 0915   HGB 11.6* 06/01/2011 0420   HCT 37.0* 07/21/2013 0915   HCT 35.2* 06/01/2011 0420   PLT 145 07/21/2013 0915   PLT 151 06/01/2011 0420   MCV 94.6 07/21/2013 0915   MCV 93.4 06/01/2011 0420   MCH 32.2 07/21/2013 0915   MCH 30.8 06/01/2011 0420   MCHC 34.0 07/21/2013 0915   MCHC 33.0 06/01/2011 0420   RDW 14.2 07/21/2013 0915   RDW 13.9 06/01/2011 0420   LYMPHSABS 1.1 07/21/2013 0915   LYMPHSABS 1.2 05/29/2011 0812   MONOABS 0.6 07/21/2013 0915   MONOABS 0.4 05/29/2011 0812   EOSABS 0.1 07/21/2013 0915   EOSABS 0.2 05/29/2011 0812   BASOSABS 0.0 07/21/2013 0915   BASOSABS 0.0 05/29/2011 0812    CMP     Component Value Date/Time   NA 141 07/21/2013 0915   NA 140 01/15/2012 0930   K 4.3 07/21/2013  0915   K 4.3 01/15/2012 0930   CL 109* 01/20/2013 0842   CL 108 01/15/2012 0930   CO2 27 07/21/2013 0915   CO2 25 01/15/2012 0930   GLUCOSE 90 07/21/2013 0915   GLUCOSE 114* 01/20/2013 0842   GLUCOSE 87 01/15/2012 0930   BUN 16.7 07/21/2013 0915   BUN 17 01/15/2012 0930   CREATININE 1.2 07/21/2013 0915   CREATININE 1.29 01/15/2012 0930   CALCIUM 9.3 07/21/2013 0915   CALCIUM 8.6 01/15/2012 0930   PROT 7.7 07/21/2013 0915   PROT 6.8 01/15/2012 0930   ALBUMIN 3.5 07/21/2013 0915   ALBUMIN 3.9 01/15/2012 0930   AST 11 07/21/2013 0915   AST 11 01/15/2012 0930   ALT 7 07/21/2013 0915   ALT 10 01/15/2012 0930   ALKPHOS 102 07/21/2013 0915   ALKPHOS 109 01/15/2012 0930   BILITOT 0.69 07/21/2013 0915   BILITOT 0.7 01/15/2012 0930   GFRNONAA 60* 06/01/2011 0420   GFRAA >60 06/01/2011 0420   Iron/TIBC/Ferritin    Component Value Date/Time   IRON 86 07/21/2013 0915   IRON 49 01/20/2013 0842   TIBC 233 07/21/2013 0915   TIBC 237 01/20/2013 0842   FERRITIN 149 07/21/2013 0915   FERRITIN 126 04/21/2013 0821     IMAGING STUDIES:  1. CT scan of the head without IV contrast on 05/28/2011 showed no  acute intracranial abnormality.  2. Chest x-ray (2 view) from 12/08/2011 showed COPD and chronic  bronchitis/scarring. There were no acute processes. 3. Chest x-ray, 2 view, on 03/23/2012; also 2 views of the left ribs showed subtle minimally displaced fracture of the posterolateral aspect of the left 7th rib.  The patient is status post median sternotomy for CABG.  A left-sided pacemaker device is in place. No acute cardiopulmonary disease was present. 4. CT scan of the head without IV contrast was carried out on 05/26/2012. Interpretation was provided by [facility].  This interpretation is not available at this time. 5. Chest x-ray, 2 view, on 01/19/2013 showed COPD/chronic changes.  No active disease.   IMPRESSION AND PLAN: 1. Iron deficiency anemia.  --John Peck's hemoglobin and hematocrit and  previous iron studies remain stable and satisfactory.  I have recommended that John Peck stay on ferrous sulfate 3  times daily.   --We will plan to check a CBC and ferritin in 3 months and plan to see John Peck again in 6 months at which time we will check CBC, chemistries and iron studies. --We provided him a handout about iron deficiency and iron supplementation.  ______________________________ Jaclyn Prime Kemani Demarais, MD, MS 07/21/2013 3:00 PM

## 2013-08-29 ENCOUNTER — Encounter: Payer: Self-pay | Admitting: Cardiovascular Disease

## 2013-08-29 ENCOUNTER — Ambulatory Visit (INDEPENDENT_AMBULATORY_CARE_PROVIDER_SITE_OTHER): Payer: Medicare Other | Admitting: Cardiovascular Disease

## 2013-08-29 VITALS — BP 138/72 | HR 60 | Ht 69.0 in | Wt 186.7 lb

## 2013-08-29 DIAGNOSIS — Z862 Personal history of diseases of the blood and blood-forming organs and certain disorders involving the immune mechanism: Secondary | ICD-10-CM

## 2013-08-29 DIAGNOSIS — I4891 Unspecified atrial fibrillation: Secondary | ICD-10-CM

## 2013-08-29 DIAGNOSIS — I10 Essential (primary) hypertension: Secondary | ICD-10-CM

## 2013-08-29 DIAGNOSIS — I480 Paroxysmal atrial fibrillation: Secondary | ICD-10-CM

## 2013-08-29 DIAGNOSIS — I2510 Atherosclerotic heart disease of native coronary artery without angina pectoris: Secondary | ICD-10-CM

## 2013-08-29 DIAGNOSIS — Z950 Presence of cardiac pacemaker: Secondary | ICD-10-CM

## 2013-08-29 DIAGNOSIS — Z951 Presence of aortocoronary bypass graft: Secondary | ICD-10-CM

## 2013-08-29 NOTE — Patient Instructions (Signed)
Your physician recommends that you schedule a follow-up appointment in: 1 YEAR. No changes were made today. 

## 2013-08-29 NOTE — Progress Notes (Signed)
Patient ID: John Peck, male   DOB: 07/31/1924, 79 y.o.   MRN: [id number]     HPI: John Peck is a 79 y.o. male who presents to the office today for a one-year cardiology evaluation.  [name] has known coronary artery disease in 1989 underwent CABG surgery with LIMA to the LAD, a vein to the circumflex. In 2004 he underwent stenting to his right artery. His last cardiac catheterization in January 2013 showed an ejection fraction of 50% with mild inferior, distal inferior and apical lateral hypocontractility. He had significant native CAD with 80% stenosis of the LAD after the second diagonal vessel and septal perforator artery with total occlusion of the mid LAD. He was 67% ostial stenosis in the circumflex vessel followed by 40% proximal stenosis and 7080% distal stenosis. He had an occluded Y graft which previously sequentially supplied the intermediate and distal circumflex. Patent stented proximal right coronary artery with 90% stenosis in the anterior RV marginal branch arising from the mid RCA and had 70-80% stenosis in the distal RCA beyond the crux with 78% stenosis the PDA takeoff. His LIMA to LAD was patent. He has been on medical therapy. He has a history of atrial fibrillation, sick sinus syndrome and status post permanent pacemaker in 2009 which is a St. Jude device. Additional problems include iron deficiency anemia, hypertension, BPH, status post TURP in 1992, remote history of liver abscess, history of COPD.  Over the past year he has continued to do fairly well without significant anginal symptoms medical regimen consisting of amlodipine 5 mg carvedilol 25 twice a day isosorbide 90 mg in the morning 60 mg at night her nose in 1000 twice a day. He is on Crestor 20 mg for his hyperlipidemia. He takes Lasix 20 mg for intermittent peripheral edema. He also was on 2 antiplatelet therapy with low-dose aspirin and Plavix.  Presently he denies chest pain. He denies exertional  shortness of breath. He denies tachydysrhythmia. He presents for one-year evaluation.  Past Medical History  Diagnosis Date  . Hypertension   . Pacemaker Sept 2009    St Jude  . CAD (coronary artery disease)     CABG '89, PCI '04  . Intermediate coronary syndrome   . Arrhythmia     afib, bradycardia  . Diastolic dysfunction   . Anemia   . Sick sinus syndrome Sept 2009    ST Jude PTVDP  . History of COPD   . H/O: GI bleed     REMOTE HISTORY  . Hyperlipidemia     Past Surgical History  Procedure Laterality Date  . Coronary artery bypass graft  1989    had LIMA to his LAD, a vein to the circumflex. In 2004 underwent stenting to his right coronary artery.  . Cardiac catheterization  11/2011    EF 50%; significant native CAD w/80% stenosis of the LAD after 2nd giagonal vessel and septal perforating artery w/total occlusion of the mid left anterior descendiung.; 60-70% ostiasl stenosis in the circumflex vessel followed by 40% proximal stenosis and 70-80% distal circumflex stenosis;   . Cataract extraction  2004  . Prostatectomy    . Hemorrhoid surgery    . Insert / replace / remove pacemaker  07/17/08    DUAL-CHAMBER; PPM-ST.JUDE MEDNET  . Coronary angioplasty with stent placement  2004    RCA    No Known Allergies  Current Outpatient Prescriptions  Medication Sig Dispense Refill  . alfuzosin (UROXATRAL) 10 MG 24 hr tablet Take  10 mg by mouth daily.      Marland Kitchen amLODipine (NORVASC) 5 MG tablet Take 10 mg by mouth daily.       Marland Kitchen aspirin 81 MG tablet Take 81 mg by mouth daily.        . carvedilol (COREG) 25 MG tablet Take 25 mg by mouth 2 (two) times daily with a meal.        . clopidogrel (PLAVIX) 75 MG tablet Take 75 mg by mouth daily.        . ferrous sulfate 325 (65 FE) MG tablet Take 325 mg by mouth 3 (three) times daily with meals.       . furosemide (LASIX) 20 MG tablet Take 20 mg by mouth daily.        . isosorbide mononitrate (IMDUR) 60 MG 24 hr tablet Take 90 mg in the AM  and 60 mg in the PM      . KLOR-CON 10 10 MEQ tablet 10 mEq daily.       Marland Kitchen lubiprostone (AMITIZA) 24 MCG capsule Take 24 mcg by mouth daily as needed. For bowel movement      . NITROSTAT 0.4 MG SL tablet Take 0.4 mg by mouth as needed.      . pantoprazole (PROTONIX) 40 MG tablet Take 40 mg by mouth 2 (two) times daily.      . ranolazine (RANEXA) 1000 MG SR tablet Take 1 tablet (1,000 mg total) by mouth 2 (two) times daily.  180 tablet  2  . rosuvastatin (CRESTOR) 20 MG tablet Take 20 mg by mouth daily.       No current facility-administered medications for this visit.    History   Social History  . Marital Status: Married    Spouse Name: N/A    Number of Children: 5  . Years of Education: N/A   Occupational History  . RETIRED     TRUCK DRIVER   Social History Main Topics  . Smoking status: Former Smoker -- 2.00 packs/day for 65 years    Types: Cigarettes  . Smokeless tobacco: Former Neurosurgeon    Quit date: 10/29/2008  . Alcohol Use: No  . Drug Use: No  . Sexual Activity: Not on file   Other Topics Concern  . Not on file   Social History Narrative  . No narrative on file    History reviewed. No pertinent family history.  ROS is negative for fevers, chills or night sweats. He denies wheezing. He does have COPD. He denies tachycardia palpitations. He is followed by Dr. Lorenz Coaster for his pacemaker device. He denies anginal symptoms. He denies nausea vomiting. Her bowel he had undergone endoscopy and colonoscopy by Dr. Rae Lips in 2012 without significant abnormalities. He denies hematuria or hematochezia. He denies significant claudication. At times her some leg swelling. There is no diabetes. Other comprehensive 12 point system review is negative.  PE BP 138/72  Pulse 60  Ht 5\' 9"  (1.753 m)  Wt 186 lb 11.2 oz (84.687 kg)  BMI 27.56 kg/m2  General: Alert, oriented, no distress.  Skin: normal turgor, no rashes HEENT: Normocephalic, atraumatic. Pupils round and reactive; sclera  anicteric;no lid lag.  Nose without nasal septal hypertrophy Mouth/Parynx benign; Mallinpatti scale 3 Neck: No JVD, no carotid briuts Lungs: clear to ausculatation and percussion; no wheezing or rales Heart: RRR, s1 s2 normal over 6 systolic murmur. Abdomen: Mild central adiposity ;soft, nontender; no hepatosplenomehaly, BS+; abdominal aorta nontender and not dilated by palpation. Pulses 2+ Extremities:  no clubbing cyanosis or edema, Homan's sign negative  Neurologic: grossly nonfocal Psychologic: normal affect and mood.  ECG: AV dual paced rhythm.  LABS:  BMET    Component Value Date/Time   NA 141 07/21/2013 0915   NA 140 01/15/2012 0930   K 4.3 07/21/2013 0915   K 4.3 01/15/2012 0930   CL 109* 01/20/2013 0842   CL 108 01/15/2012 0930   CO2 27 07/21/2013 0915   CO2 25 01/15/2012 0930   GLUCOSE 90 07/21/2013 0915   GLUCOSE 114* 01/20/2013 0842   GLUCOSE 87 01/15/2012 0930   BUN 16.7 07/21/2013 0915   BUN 17 01/15/2012 0930   CREATININE 1.2 07/21/2013 0915   CREATININE 1.29 01/15/2012 0930   CALCIUM 9.3 07/21/2013 0915   CALCIUM 8.6 01/15/2012 0930   GFRNONAA 60* 06/01/2011 0420   GFRAA >60 06/01/2011 0420     Hepatic Function Panel     Component Value Date/Time   PROT 7.7 07/21/2013 0915   PROT 6.8 01/15/2012 0930   ALBUMIN 3.5 07/21/2013 0915   ALBUMIN 3.9 01/15/2012 0930   AST 11 07/21/2013 0915   AST 11 01/15/2012 0930   ALT 7 07/21/2013 0915   ALT 10 01/15/2012 0930   ALKPHOS 102 07/21/2013 0915   ALKPHOS 109 01/15/2012 0930   BILITOT 0.69 07/21/2013 0915   BILITOT 0.7 01/15/2012 0930   BILIDIR <0.1 08/12/2008 0440   IBILI NOT CALCULATED 08/12/2008 0440     CBC    Component Value Date/Time   WBC 4.2 07/21/2013 0915   WBC 6.2 06/01/2011 0420   RBC 3.92* 07/21/2013 0915   RBC 3.77* 06/01/2011 0420   HGB 12.6* 07/21/2013 0915   HGB 11.6* 06/01/2011 0420   HCT 37.0* 07/21/2013 0915   HCT 35.2* 06/01/2011 0420   PLT 145 07/21/2013 0915   PLT 151 06/01/2011 0420   MCV 94.6 07/21/2013 0915   MCV 93.4  06/01/2011 0420   MCH 32.2 07/21/2013 0915   MCH 30.8 06/01/2011 0420   MCHC 34.0 07/21/2013 0915   MCHC 33.0 06/01/2011 0420   RDW 14.2 07/21/2013 0915   RDW 13.9 06/01/2011 0420   LYMPHSABS 1.1 07/21/2013 0915   LYMPHSABS 1.2 05/29/2011 0812   MONOABS 0.6 07/21/2013 0915   MONOABS 0.4 05/29/2011 0812   EOSABS 0.1 07/21/2013 0915   EOSABS 0.2 05/29/2011 0812   BASOSABS 0.0 07/21/2013 0915   BASOSABS 0.0 05/29/2011 0812     BNP    Component Value Date/Time   PROBNP 304.0* 11/12/2008 1154    Lipid Panel     Component Value Date/Time   CHOL 124 12/10/2011 0806   TRIG 90 12/10/2011 0806   HDL 46 12/10/2011 0806   CHOLHDL 2.7 12/10/2011 0806   VLDL 18 12/10/2011 0806   LDLCALC 60 12/10/2011 0806     RADIOLOGY: No results found.    ASSESSMENT AND PLAN: From a cardiac standpoint, Mr. Gatta continues to do fairly well. He is now 25 years since his initial bypass surgery in 10 years since his stent was placed to his right coronary artery. He is on medical therapy for distal RCA disease as noted above and seems to be doing well without recurrent angina symptoms. His last nuclear stress test was in 2012 which was low risk. He will be undergoing pacemaker interrogation by Dr. Ladona Ridgel next month. He is on iron replacement therapy for his iron deficiency anemia. He is not having any significant arrhythmias. As long as he remains stable I will see him  in one year for cardiology reassessment. He will continue his current antianginal regimen presently.     Lennette Bihari, MD, [facility]  08/29/2013 1:35 PM

## 2013-09-14 ENCOUNTER — Other Ambulatory Visit: Payer: Self-pay | Admitting: Cardiovascular Disease

## 2013-09-14 NOTE — Telephone Encounter (Signed)
Rx was sent to pharmacy electronically. 

## 2013-09-15 ENCOUNTER — Other Ambulatory Visit: Payer: Self-pay

## 2013-09-15 MED ORDER — AMLODIPINE BESYLATE 10 MG PO TABS: 10.0000 mg | ORAL_TABLET | Freq: Every day | ORAL | Status: DC

## 2013-09-15 MED ORDER — ISOSORBIDE MONONITRATE ER 60 MG PO TB24: ORAL_TABLET | ORAL | Status: DC

## 2013-09-15 NOTE — Telephone Encounter (Signed)
Rx was sent to pharmacy electronically. 

## 2013-09-22 ENCOUNTER — Ambulatory Visit (INDEPENDENT_AMBULATORY_CARE_PROVIDER_SITE_OTHER): Payer: Medicare Other | Admitting: Internal Medicine

## 2013-09-22 ENCOUNTER — Encounter (INDEPENDENT_AMBULATORY_CARE_PROVIDER_SITE_OTHER): Payer: Self-pay

## 2013-09-22 ENCOUNTER — Encounter: Payer: Self-pay | Admitting: Internal Medicine

## 2013-09-22 ENCOUNTER — Encounter: Payer: Medicare Other | Admitting: Internal Medicine

## 2013-09-22 VITALS — BP 117/60 | HR 57 | Ht 69.0 in | Wt 185.8 lb

## 2013-09-22 DIAGNOSIS — Z951 Presence of aortocoronary bypass graft: Secondary | ICD-10-CM

## 2013-09-22 DIAGNOSIS — I495 Sick sinus syndrome: Secondary | ICD-10-CM

## 2013-09-22 DIAGNOSIS — Z950 Presence of cardiac pacemaker: Secondary | ICD-10-CM

## 2013-09-22 DIAGNOSIS — I10 Essential (primary) hypertension: Secondary | ICD-10-CM

## 2013-09-22 LAB — MDC_IDC_ENUM_SESS_TYPE_INCLINIC
Battery Voltage: 2.78 V
Brady Statistic RA Percent Paced: 96 %
Brady Statistic RV Percent Paced: 97 %
Implantable Pulse Generator Model: 5826
Implantable Pulse Generator Serial Number: 1266473
Lead Channel Impedance Value: 379 Ohm
Lead Channel Pacing Threshold Amplitude: 0.5 V
Lead Channel Pacing Threshold Amplitude: 1.125 V
Lead Channel Pacing Threshold Pulse Width: 0.4 ms
Lead Channel Pacing Threshold Pulse Width: 0.4 ms
Lead Channel Sensing Intrinsic Amplitude: 7.3 mV
Lead Channel Setting Pacing Amplitude: 2 V
Lead Channel Setting Pacing Pulse Width: 0.4 ms

## 2013-09-22 NOTE — Assessment & Plan Note (Signed)
His blood pressure is well controlled. No change in medical therapy. 

## 2013-09-22 NOTE — Patient Instructions (Signed)
Your physician recommends that you continue on your current medications as directed. Please refer to the Current Medication list given to you today.  Your physician wants you to follow-up in: one year with Dr. Taylor.  You will receive a reminder letter in the mail two months in advance. If you don't receive a letter, please call our office to schedule the follow-up appointment.  

## 2013-09-22 NOTE — Assessment & Plan Note (Signed)
His St. Jude dual-chamber pacemaker is working normally. Plan to recheck in several months. He is pacing in the atrium over 90% of the time.

## 2013-09-22 NOTE — Assessment & Plan Note (Signed)
He denies anginal symptoms. He admits to being sedentary, limited by arthritis in his knees and hips. I've encouraged him to increase his physical activity.

## 2013-09-22 NOTE — Progress Notes (Signed)
HPI [name] returns today for followup. He is a very pleasant 79 year old man with a history of symptomatic bradycardia, status post permanent pacemaker insertion. He also is hypertension. He has done well in the interim. His wife notes that he is relatively inactive. He denies syncope or near-syncope. No peripheral edema. He has had trouble with sleeping. He sleeps during the day and then is up at night. No Known Allergies   Current Outpatient Prescriptions  Medication Sig Dispense Refill  . alfuzosin (UROXATRAL) 10 MG 24 hr tablet Take 10 mg by mouth daily.      Marland Kitchen amLODipine (NORVASC) 10 MG tablet Take 1 tablet (10 mg total) by mouth daily.  90 tablet  3  . aspirin 81 MG tablet Take 81 mg by mouth daily.        . carvedilol (COREG) 25 MG tablet Take 25 mg by mouth 2 (two) times daily with a meal.        . clopidogrel (PLAVIX) 75 MG tablet Take 75 mg by mouth daily.        . ferrous sulfate 325 (65 FE) MG tablet Take 325 mg by mouth 3 (three) times daily with meals.       . furosemide (LASIX) 20 MG tablet Take 20 mg by mouth daily.        . isosorbide mononitrate (IMDUR) 60 MG 24 hr tablet 60 mg. Take 60 mg in the AM and 30 mg in the PM      . KLOR-CON 10 10 MEQ tablet 10 mEq daily.       Marland Kitchen lubiprostone (AMITIZA) 24 MCG capsule Take 24 mcg by mouth daily as needed. For bowel movement      . NITROSTAT 0.4 MG SL tablet Take 0.4 mg by mouth as needed.      . pantoprazole (PROTONIX) 40 MG tablet Take 40 mg by mouth 2 (two) times daily.      . ranolazine (RANEXA) 1000 MG SR tablet Take 1 tablet (1,000 mg total) by mouth 2 (two) times daily.  180 tablet  2  . rosuvastatin (CRESTOR) 20 MG tablet Take 20 mg by mouth daily.       No current facility-administered medications for this visit.     Past Medical History  Diagnosis Date  . Hypertension   . Pacemaker Sept 2009    St Jude  . CAD (coronary artery disease)     CABG '89, PCI '04  . Intermediate coronary syndrome   . Arrhythmia    afib, bradycardia  . Diastolic dysfunction   . Anemia   . Sick sinus syndrome Sept 2009    ST Jude PTVDP  . History of COPD   . H/O: GI bleed     REMOTE HISTORY  . Hyperlipidemia     ROS:   All systems reviewed and negative except as noted in the HPI.   Past Surgical History  Procedure Laterality Date  . Coronary artery bypass graft  1989    had LIMA to his LAD, a vein to the circumflex. In 2004 underwent stenting to his right coronary artery.  . Cardiac catheterization  11/2011    EF 50%; significant native CAD w/80% stenosis of the LAD after 2nd giagonal vessel and septal perforating artery w/total occlusion of the mid left anterior descendiung.; 60-70% ostiasl stenosis in the circumflex vessel followed by 40% proximal stenosis and 70-80% distal circumflex stenosis;   . Cataract extraction  2004  . Prostatectomy    . Hemorrhoid  surgery    . Insert / replace / remove pacemaker  07/17/08    DUAL-CHAMBER; PPM-ST.JUDE MEDNET  . Coronary angioplasty with stent placement  2004    RCA     History reviewed. No pertinent family history.   History   Social History  . Marital Status: Married    Spouse Name: N/A    Number of Children: 5  . Years of Education: N/A   Occupational History  . RETIRED     TRUCK DRIVER   Social History Main Topics  . Smoking status: Former Smoker -- 2.00 packs/day for 65 years    Types: Cigarettes  . Smokeless tobacco: Former Neurosurgeon    Quit date: 10/29/2008  . Alcohol Use: No  . Drug Use: No  . Sexual Activity: Not on file   Other Topics Concern  . Not on file   Social History Narrative  . No narrative on file     BP 117/60  Pulse 57  Ht 5\' 9"  (1.753 m)  Wt 185 lb 12.8 oz (84.278 kg)  BMI 27.43 kg/m2  Physical Exam:  Well appearing 79 year old man, NAD HEENT: Unremarkable Neck:  7 cm JVD, no thyromegally Lungs:  Clear with no wheezes, rales, or rhonchi. HEART:  Regular rate rhythm, no murmurs, no rubs, no clicks Abd:  soft,  positive bowel sounds, no organomegally, no rebound, no guarding Ext:  2 plus pulses, no edema, no cyanosis, no clubbing Skin:  No rashes no nodules Neuro:  CN II through XII intact, motor grossly intact  DEVICE  Normal device function.  See PaceArt for details.   Assess/Plan:

## 2013-10-04 ENCOUNTER — Encounter: Payer: Self-pay | Admitting: Internal Medicine

## 2013-10-09 ENCOUNTER — Encounter: Payer: Medicare Other | Admitting: Internal Medicine

## 2013-10-09 ENCOUNTER — Encounter: Payer: Self-pay | Admitting: Internal Medicine

## 2013-10-20 ENCOUNTER — Other Ambulatory Visit (HOSPITAL_BASED_OUTPATIENT_CLINIC_OR_DEPARTMENT_OTHER): Payer: Medicare Other

## 2013-10-20 DIAGNOSIS — D509 Iron deficiency anemia, unspecified: Secondary | ICD-10-CM

## 2013-10-20 DIAGNOSIS — Z862 Personal history of diseases of the blood and blood-forming organs and certain disorders involving the immune mechanism: Secondary | ICD-10-CM

## 2013-10-20 LAB — CBC WITH DIFFERENTIAL/PLATELET
Basophils Absolute: 0 10*3/uL (ref 0.0–0.1)
Eosinophils Absolute: 0.2 10*3/uL (ref 0.0–0.5)
HCT: 35.2 % — ABNORMAL LOW (ref 38.4–49.9)
HGB: 12 g/dL — ABNORMAL LOW (ref 13.0–17.1)
LYMPH%: 20.9 % (ref 14.0–49.0)
MCV: 94.1 fL (ref 79.3–98.0)
MONO#: 0.5 10*3/uL (ref 0.1–0.9)
MONO%: 12.7 % (ref 0.0–14.0)
NEUT#: 2.5 10*3/uL (ref 1.5–6.5)
NEUT%: 61.2 % (ref 39.0–75.0)
RBC: 3.74 10*6/uL — ABNORMAL LOW (ref 4.20–5.82)
RDW: 14.2 % (ref 11.0–14.6)
lymph#: 0.9 10*3/uL (ref 0.9–3.3)

## 2013-12-25 ENCOUNTER — Encounter: Payer: Self-pay | Admitting: Internal Medicine

## 2013-12-25 DIAGNOSIS — I498 Other specified cardiac arrhythmias: Secondary | ICD-10-CM

## 2014-01-11 ENCOUNTER — Other Ambulatory Visit: Payer: Self-pay | Admitting: Pharmacist Clinician (PhC)/ Clinical Pharmacy Specialist

## 2014-01-11 MED ORDER — ROSUVASTATIN CALCIUM 20 MG PO TABS: 20.0000 mg | ORAL_TABLET | Freq: Every day | ORAL | Status: DC

## 2014-01-11 NOTE — Telephone Encounter (Signed)
Pt spouse brough [place] forms for Crestor for 2015.  She states she mailed proof of income directly to [facility]

## 2014-01-18 ENCOUNTER — Other Ambulatory Visit: Payer: Medicare Other | Admitting: Lab

## 2014-01-19 ENCOUNTER — Ambulatory Visit (HOSPITAL_BASED_OUTPATIENT_CLINIC_OR_DEPARTMENT_OTHER): Payer: Medicare Other | Admitting: Internal Medicine

## 2014-01-19 ENCOUNTER — Telehealth: Payer: Self-pay | Admitting: Cardiovascular Disease

## 2014-01-19 ENCOUNTER — Other Ambulatory Visit (HOSPITAL_BASED_OUTPATIENT_CLINIC_OR_DEPARTMENT_OTHER): Payer: Medicare Other

## 2014-01-19 VITALS — BP 119/62 | HR 65 | Temp 97.6°F | Resp 18 | Ht 69.0 in | Wt 182.4 lb

## 2014-01-19 DIAGNOSIS — Z862 Personal history of diseases of the blood and blood-forming organs and certain disorders involving the immune mechanism: Secondary | ICD-10-CM

## 2014-01-19 LAB — CBC WITH DIFFERENTIAL/PLATELET
BASO%: 0.6 % (ref 0.0–2.0)
BASOS ABS: 0 10*3/uL (ref 0.0–0.1)
EOS%: 4.3 % (ref 0.0–7.0)
Eosinophils Absolute: 0.2 10*3/uL (ref 0.0–0.5)
HEMATOCRIT: 36.8 % — AB (ref 38.4–49.9)
HEMOGLOBIN: 12.3 g/dL — AB (ref 13.0–17.1)
LYMPH%: 27.8 % (ref 14.0–49.0)
MCH: 31.8 pg (ref 27.2–33.4)
MCHC: 33.4 g/dL (ref 32.0–36.0)
MCV: 95.2 fL (ref 79.3–98.0)
MONO#: 0.6 10*3/uL (ref 0.1–0.9)
MONO%: 14 % (ref 0.0–14.0)
NEUT%: 53.3 % (ref 39.0–75.0)
NEUTROS ABS: 2.4 10*3/uL (ref 1.5–6.5)
Platelets: 163 10*3/uL (ref 140–400)
RBC: 3.86 10*6/uL — ABNORMAL LOW (ref 4.20–5.82)
RDW: 14.7 % — ABNORMAL HIGH (ref 11.0–14.6)
WBC: 4.4 10*3/uL (ref 4.0–10.3)
lymph#: 1.2 10*3/uL (ref 0.9–3.3)

## 2014-01-19 LAB — COMPREHENSIVE METABOLIC PANEL (CC13)
ALBUMIN: 3.4 g/dL — AB (ref 3.5–5.0)
ALT: 8 U/L (ref 0–55)
AST: 11 U/L (ref 5–34)
Alkaline Phosphatase: 92 U/L (ref 40–150)
Anion Gap: 11 mEq/L (ref 3–11)
BUN: 17.1 mg/dL (ref 7.0–26.0)
CHLORIDE: 108 meq/L (ref 98–109)
CO2: 26 mEq/L (ref 22–29)
CREATININE: 1.2 mg/dL (ref 0.7–1.3)
Calcium: 9.4 mg/dL (ref 8.4–10.4)
Glucose: 98 mg/dl (ref 70–140)
POTASSIUM: 3.8 meq/L (ref 3.5–5.1)
Sodium: 145 mEq/L (ref 136–145)
Total Bilirubin: 0.51 mg/dL (ref 0.20–1.20)
Total Protein: 7.6 g/dL (ref 6.4–8.3)

## 2014-01-19 LAB — IRON AND TIBC CHCC
%SAT: 36 % (ref 20–55)
Iron: 76 ug/dL (ref 42–163)
TIBC: 211 ug/dL (ref 202–409)
UIBC: 135 ug/dL (ref 117–376)

## 2014-01-19 LAB — FERRITIN CHCC: Ferritin: 256 ng/ml (ref 22–316)

## 2014-01-19 MED ORDER — NITROGLYCERIN 0.4 MG SL SUBL: 0.4000 mg | SUBLINGUAL_TABLET | SUBLINGUAL | Status: DC | PRN

## 2014-01-19 NOTE — Progress Notes (Signed)
[facility] OFFICE PROGRESS NOTE  Patricia Nettle, MD [place] [address]  DIAGNOSIS: History of iron deficiency anemia - Plan: CBC with Differential, Ferritin, Iron and TIBC, CBC with Differential, Iron and TIBC, Ferritin  Chief Complaint  Patient presents with  . History of iron deficiency anemia    CURRENT TREATMENT: Feraheme prn  INTERVAL HISTORY: John Peck 80 y.o. male with a history of anemia is here for follow-up.  John Peck was last seen by me on 07/21/2013. He denies any pagophagia. His overall condition has been generally stable, without any surgery or hospitalizations over the past year. He reports feeling pretty good. He is without chest pain. He has required no blood transfusions.   MEDICAL HISTORY: Past Medical History  Diagnosis Date  . Hypertension   . Pacemaker Sept 2009    St Jude  . CAD (coronary artery disease)     CABG '89, PCI '04  . Intermediate coronary syndrome   . Arrhythmia     afib, bradycardia  . Diastolic dysfunction   . Anemia   . Sick sinus syndrome Sept 2009    ST Jude PTVDP  . History of COPD   . H/O: GI bleed     REMOTE HISTORY  . Hyperlipidemia     INTERIM HISTORY: has HYPERTENSION; INTERMEDIATE CORONARY SYNDROME; S/P CABG x 2 '89. RCA stent'04. last cath Jan 2013; PACEMAKER, PERMANENT- St Jude Sept 2009; History of iron deficiency anemia; Sinoatrial node dysfunction; PAF (paroxysmal atrial fibrillation); and Lower extremity edema on his problem list.    ALLERGIES:  has No Known Allergies.  MEDICATIONS: has a current medication list which includes the following prescription(s): alfuzosin, amlodipine, aspirin, carvedilol, clopidogrel, ferrous sulfate, furosemide, isosorbide mononitrate, klor-con 10, lubiprostone, pantoprazole, ranolazine, rosuvastatin, and nitroglycerin.  SURGICAL HISTORY:  Past Surgical History  Procedure Laterality Date  . Coronary artery bypass graft  1989    had LIMA  to his LAD, a vein to the circumflex. In 2004 underwent stenting to his right coronary artery.  . Cardiac catheterization  11/2011    EF 50%; significant native CAD w/80% stenosis of the LAD after 2nd giagonal vessel and septal perforating artery w/total occlusion of the mid left anterior descendiung.; 60-70% ostiasl stenosis in the circumflex vessel followed by 40% proximal stenosis and 70-80% distal circumflex stenosis;   . Cataract extraction  2004  . Prostatectomy    . Hemorrhoid surgery    . Insert / replace / remove pacemaker  07/17/08    DUAL-CHAMBER; PPM-ST.JUDE MEDNET  . Coronary angioplasty with stent placement  2004    RCA   PROBLEM LIST:  1. Iron deficiency anemia dating back to April 2007. The patient is currently on oral iron.Stools were Hemoccult positive x3 on 03/30/2011. He had an endoscopy and colonoscopy by Dr. Wilford Corner on 04/20/2011. There were no significant abnormalities.  2. Coronary artery disease status post CABG and status post coronary  artery stents.  3. Permanent dual chamber pacemaker placed in 1989.  4. Hypertension.  5. Dyslipidemia.  6. BPH status post TURP in 1992.  7. History of liver abscess.  8. COPD.  REVIEW OF SYSTEMS:   Constitutional: Denies fevers, chills or abnormal weight loss Eyes: Denies blurriness of vision Ears, nose, mouth, throat, and face: Denies mucositis or sore throat Respiratory: Denies cough, dyspnea or wheezes Cardiovascular: Denies palpitation, chest discomfort or lower extremity swelling Gastrointestinal:  Denies nausea, heartburn or change in bowel habits Skin: Denies abnormal skin rashes  Lymphatics: Denies new lymphadenopathy or easy bruising Neurological:Denies numbness, tingling or new weaknesses Behavioral/Psych: Mood is stable, no new changes  All other systems were reviewed with the patient and are negative.  PHYSICAL EXAMINATION: ECOG PERFORMANCE STATUS: 0 - Asymptomatic  Blood pressure 119/62, pulse 65,  temperature 97.6 F (36.4 C), temperature source Oral, resp. rate 18, height 5\' 9"  (1.753 m), weight 182 lb 6.4 oz (82.736 kg), SpO2 97.00%.  GENERAL:alert, no distress and comfortable;John Peck is an elderly gentleman, somewhat frail. His station and gait is unstable. He uses a cane.   SKIN: skin color, texture, turgor are normal, no rashes or significant lesions EYES: normal, Conjunctiva are pink and non-injected, sclera clear OROPHARYNX:no exudate, no erythema and lips, buccal mucosa, and tongue normal  NECK: supple, thyroid normal size, non-tender, without nodularity LYMPH:  no palpable lymphadenopathy in the cervical, axillary or supraclavicular LUNGS: clear to auscultation with normal breathing effort, no wheezes or rhonchi HEART: regular rate & rhythm and no murmurs and no lower extremity edema ABDOMEN:abdomen soft, non-tender and normal bowel sounds Musculoskeletal:no cyanosis of digits and no clubbing  NEURO: alert & oriented x 3 with fluent speech, no focal motor/sensory deficits  Labs:  Lab Results  Component Value Date   WBC 4.4 01/19/2014   HGB 12.3* 01/19/2014   HCT 36.8* 01/19/2014   MCV 95.2 01/19/2014   PLT 163 01/19/2014   NEUTROABS 2.4 01/19/2014      Chemistry      Component Value Date/Time   NA 145 01/19/2014 0814   NA 140 01/15/2012 0930   K 3.8 01/19/2014 0814   K 4.3 01/15/2012 0930   CL 109* 01/20/2013 0842   CL 108 01/15/2012 0930   CO2 26 01/19/2014 0814   CO2 25 01/15/2012 0930   BUN 17.1 01/19/2014 0814   BUN 17 01/15/2012 0930   CREATININE 1.2 01/19/2014 0814   CREATININE 1.29 01/15/2012 0930      Component Value Date/Time   CALCIUM 9.4 01/19/2014 0814   CALCIUM 8.6 01/15/2012 0930   ALKPHOS 92 01/19/2014 0814   ALKPHOS 109 01/15/2012 0930   AST 11 01/19/2014 0814   AST 11 01/15/2012 0930   ALT 8 01/19/2014 0814   ALT 10 01/15/2012 0930   BILITOT 0.51 01/19/2014 0814   BILITOT 0.7 01/15/2012 0930       Basic Metabolic Panel:  Recent Labs Lab 01/19/14 0814  NA  145  K 3.8  CO2 26  GLUCOSE 98  BUN 17.1  CREATININE 1.2  CALCIUM 9.4   GFR Estimated Creatinine Clearance: 43.4 ml/min (by C-G formula based on Cr of 1.2). Liver Function Tests:  Recent Labs Lab 01/19/14 0814  AST 11  ALT 8  ALKPHOS 92  BILITOT 0.51  PROT 7.6  ALBUMIN 3.4*    CBC:  Recent Labs Lab 01/19/14 0813  WBC 4.4  NEUTROABS 2.4  HGB 12.3*  HCT 36.8*  MCV 95.2  PLT 163    Anemia work up  Recent Labs  01/19/14 0813  FERRITIN 256  TIBC 211  IRON 76    Studies:  No results found.   RADIOGRAPHIC STUDIES: No results found.  ASSESSMENT: John Peck 80 y.o. male with a history of History of iron deficiency anemia - Plan: CBC with Differential, Ferritin, Iron and TIBC, CBC with Differential, Iron and TIBC, Ferritin   PLAN:   1. Iron deficiency anemia.  --John Peck's hemoglobin and hematocrit and previous iron studies remain stable and satisfactory. I have recommended that  John Peck stay on ferrous sulfate 3 times daily.  --We will plan to check a CBC and ferritin in 3 months and plan to see John Peck again in 6 months at which time we will check CBC, chemistries  and iron studies.  --We provided him a handout about iron deficiency and iron supplementation.  All questions were answered. The patient knows to call the clinic with any problems, questions or concerns. We can certainly see the patient much sooner if necessary.  I spent 10 minutes counseling the patient face to face. The total time spent in the appointment was 15 minutes.    Damiya Sandefur, MD 01/21/2014 3:01 PM

## 2014-01-19 NOTE — Telephone Encounter (Signed)
Spoke to wife. She states he used NTG X5 ON 01/18/14 AND USED SOME TODAY. NO DISCOMFORT NOW. SHE STATES HIS BOTTLE IS EMPTY.  RN INFORMED WIFE . Rx WILL BE E- SENT T0 PHARMACY.  PATIENT SHOULD BE EVALUATED SINCE INCREASED USAGE OF NTG.

## 2014-01-19 NOTE — Telephone Encounter (Signed)
Mr.Rodier is having some chest pains and is asking that some nitro glycerin prescribe to him .Marland Kitchen Please call .Marland Kitchen   Thanks

## 2014-01-19 NOTE — Telephone Encounter (Signed)
appt made for 01/23/14 at 9:20 with an extender  He has  St jude dual pacer followed at [address] office. ?if rep is needed.  NTG sent to [facility]

## 2014-01-22 ENCOUNTER — Telehealth: Payer: Self-pay | Admitting: Internal Medicine

## 2014-01-22 NOTE — Telephone Encounter (Signed)
, °

## 2014-01-23 ENCOUNTER — Ambulatory Visit (INDEPENDENT_AMBULATORY_CARE_PROVIDER_SITE_OTHER): Payer: Medicare Other | Admitting: Cardiology

## 2014-01-23 ENCOUNTER — Encounter: Payer: Self-pay | Admitting: Cardiovascular Disease

## 2014-01-23 ENCOUNTER — Encounter: Payer: Self-pay | Admitting: Cardiology

## 2014-01-23 VITALS — BP 128/70 | HR 65 | Ht 69.0 in | Wt 183.0 lb

## 2014-01-23 DIAGNOSIS — D689 Coagulation defect, unspecified: Secondary | ICD-10-CM

## 2014-01-23 DIAGNOSIS — Z950 Presence of cardiac pacemaker: Secondary | ICD-10-CM

## 2014-01-23 DIAGNOSIS — Z951 Presence of aortocoronary bypass graft: Secondary | ICD-10-CM

## 2014-01-23 DIAGNOSIS — Z01818 Encounter for other preprocedural examination: Secondary | ICD-10-CM

## 2014-01-23 DIAGNOSIS — I10 Essential (primary) hypertension: Secondary | ICD-10-CM

## 2014-01-23 DIAGNOSIS — Z79899 Other long term (current) drug therapy: Secondary | ICD-10-CM

## 2014-01-23 DIAGNOSIS — I200 Unstable angina: Secondary | ICD-10-CM

## 2014-01-23 DIAGNOSIS — R5381 Other malaise: Secondary | ICD-10-CM

## 2014-01-23 DIAGNOSIS — R5383 Other fatigue: Secondary | ICD-10-CM

## 2014-01-23 DIAGNOSIS — I4891 Unspecified atrial fibrillation: Secondary | ICD-10-CM

## 2014-01-23 DIAGNOSIS — Z862 Personal history of diseases of the blood and blood-forming organs and certain disorders involving the immune mechanism: Secondary | ICD-10-CM

## 2014-01-23 DIAGNOSIS — I480 Paroxysmal atrial fibrillation: Secondary | ICD-10-CM

## 2014-01-23 DIAGNOSIS — R079 Chest pain, unspecified: Secondary | ICD-10-CM

## 2014-01-23 LAB — CBC WITH DIFFERENTIAL/PLATELET
Basophils Absolute: 0 10*3/uL (ref 0.0–0.1)
Basophils Relative: 0 % (ref 0–1)
Eosinophils Absolute: 0.2 10*3/uL (ref 0.0–0.7)
Eosinophils Relative: 3 % (ref 0–5)
HCT: 34.9 % — ABNORMAL LOW (ref 39.0–52.0)
Hemoglobin: 11.9 g/dL — ABNORMAL LOW (ref 13.0–17.0)
Lymphocytes Relative: 23 % (ref 12–46)
Lymphs Abs: 1.2 10*3/uL (ref 0.7–4.0)
MCH: 31.1 pg (ref 26.0–34.0)
MCHC: 34.1 g/dL (ref 30.0–36.0)
MCV: 91.1 fL (ref 78.0–100.0)
Monocytes Absolute: 0.6 10*3/uL (ref 0.1–1.0)
Monocytes Relative: 11 % (ref 3–12)
Neutro Abs: 3.2 10*3/uL (ref 1.7–7.7)
Neutrophils Relative %: 63 % (ref 43–77)
Platelets: 183 10*3/uL (ref 150–400)
RBC: 3.83 MIL/uL — ABNORMAL LOW (ref 4.22–5.81)
RDW: 15.4 % (ref 11.5–15.5)
WBC: 5 10*3/uL (ref 4.0–10.5)

## 2014-01-23 NOTE — Assessment & Plan Note (Signed)
Increasing chest pain and NTG use.

## 2014-01-23 NOTE — Progress Notes (Signed)
01/23/2014 John Peck   Nov 30, 1923  [id number]  Primary [place], MD Primary Cardiologist: Dr Claiborne Billings  HPI:  John Peck is an 80 y/o who has known coronary artery disease. In 1989 he underwent CABG with an LIMA to the LAD and a vein to the circumflex and intermediate artery. In 2004 he underwent stenting to his right artery. His last cardiac catheterization in January 2013 showed an ejection fraction of 50%.. He had significant native CAD with 80% stenosis of the LAD after the second diagonal vessel and septal perforator artery with total occlusion of the mid LAD. He had 67% ostial stenosis in the circumflex vessel followed by 40% proximal stenosis and 70-80% distal stenosis. He had an occluded Y graft which previously sequentially supplied the intermediate and distal circumflex. The previously stented proximal right coronary artery had a 90% stenosis in the anterior RV marginal branch arising from the mid RCA. There was a 70-80% stenosis in the distal RCA beyond the crux with 78% stenosis the PDA takeoff. His LIMA to LAD was patent. He has been on medical therapy. He has a history of atrial fibrillation with sick sinus syndrome and status post permanent St. Jude pacemaker in 2009. Additional problems include iron deficiency anemia, hypertension, BPH, status post TURP in 1992, remote history of liver abscess, and a history of COPD.              He is in the office today with his wife. He notes increasing episodes of angina requiring NTG. Some of these episode are exertional, such as walking up steps from the basement. Some episodes are at rest. He took NTG last night before bed for chest pain. He denies any associated diaphoresis or radiation but he admits to some dyspnea. When describing the pain he gives a Levine sign and calls it "tightness". Ntg has relived his symptoms so far.      Current Outpatient Prescriptions  Medication Sig Dispense Refill  . alfuzosin (UROXATRAL) 10  MG 24 hr tablet Take 10 mg by mouth daily.      Marland Kitchen amLODipine (NORVASC) 10 MG tablet Take 1 tablet (10 mg total) by mouth daily.  90 tablet  3  . aspirin 81 MG tablet Take 81 mg by mouth daily.        . carvedilol (COREG) 25 MG tablet Take 25 mg by mouth 2 (two) times daily with a meal.        . clopidogrel (PLAVIX) 75 MG tablet Take 75 mg by mouth daily.        . ferrous sulfate 325 (65 FE) MG tablet Take 325 mg by mouth 3 (three) times daily with meals.       . furosemide (LASIX) 20 MG tablet Take 20 mg by mouth daily.        . isosorbide mononitrate (IMDUR) 60 MG 24 hr tablet 60 mg. Take 60 mg in the AM and 30 mg in the PM      . KLOR-CON 10 10 MEQ tablet 10 mEq daily.       Marland Kitchen lubiprostone (AMITIZA) 24 MCG capsule Take 24 mcg by mouth daily as needed. For bowel movement      . nitroGLYCERIN (NITROSTAT) 0.4 MG SL tablet Place 1 tablet (0.4 mg total) under the tongue as needed.  25 tablet  6  . pantoprazole (PROTONIX) 40 MG tablet Take 40 mg by mouth 2 (two) times daily.      . ranolazine (RANEXA) 1000 MG  SR tablet Take 1 tablet (1,000 mg total) by mouth 2 (two) times daily.  180 tablet  2  . rosuvastatin (CRESTOR) 20 MG tablet Take 1 tablet (20 mg total) by mouth daily.  14 tablet  0   No current facility-administered medications for this visit.    No Known Allergies  History   Social History  . Marital Status: Married    Spouse Name: N/A    Number of Children: 29  . Years of Education: N/A   Occupational History  . RETIRED     TRUCK DRIVER   Social History Main Topics  . Smoking status: Former Smoker -- 2.00 packs/day for 65 years    Types: Cigarettes  . Smokeless tobacco: Former Systems developer    Quit date: 10/29/2008  . Alcohol Use: No  . Drug Use: No  . Sexual Activity: Not on file   Other Topics Concern  . Not on file   Social History Narrative  . No narrative on file     Review of Systems: General: negative for chills, fever, night sweats or weight changes.    Cardiovascular: negative for chest pain, dyspnea on exertion, edema, orthopnea, palpitations, paroxysmal nocturnal dyspnea or shortness of breath Dermatological: negative for rash Respiratory: negative for cough or wheezing Urologic: negative for hematuria Abdominal: negative for nausea, vomiting, diarrhea, bright red blood per rectum, melena, or hematemesis Neurologic: negative for visual changes, syncope, or dizziness All other systems reviewed and are otherwise negative except as noted above.    Blood pressure 128/70, pulse 65, height 5\' 9"  (1.753 m), weight 183 lb (83.008 kg).  General appearance: alert, cooperative and no distress Neck: no carotid bruit and no JVD Lungs: clear to auscultation bilaterally Heart: regular rate and rhythm Abdomen: obese Extremities: no edema Pulses: diminnished Skin: warm and dry Neurologic: Grossly normal  EKG V paced  ASSESSMENT AND PLAN:   Unstable angina Increasing chest pain and NTG use.   S/P CABG x 2 '89. RCA stent'04. last cath Jan 2013 .  PAF (paroxysmal atrial fibrillation) .  PACEMAKER, PERMANENT- St Jude Sept 2009 .  HYPERTENSION .  History of iron deficiency anemia .   PLAN  Discussed with Dr Claiborne Billings- increase medical Rx. Plan for OP cath later this week.  Alleya Demeter KPA-C 01/23/2014 1:25 PM

## 2014-01-23 NOTE — Patient Instructions (Signed)
Increase Imdur to 120 mg in the am and 60 mg in the pm Office will contact you about cath.

## 2014-01-24 LAB — URINALYSIS, MICROSCOPIC ONLY
Casts: NONE SEEN
Crystals: NONE SEEN
Squamous Epithelial / LPF: NONE SEEN

## 2014-01-24 LAB — COMPLETE METABOLIC PANEL WITH GFR
ALT: <8 U/L (ref 0–53)
AST: 11 U/L (ref 0–37)
Albumin: 3.7 g/dL (ref 3.5–5.2)
Alkaline Phosphatase: 85 U/L (ref 39–117)
BUN: 18 mg/dL (ref 6–23)
CO2: 27 mEq/L (ref 19–32)
Calcium: 8.9 mg/dL (ref 8.4–10.5)
Chloride: 105 mEq/L (ref 96–112)
Creat: 1.2 mg/dL (ref 0.50–1.35)
GFR, Est African American: 62 mL/min
GFR, Est Non African American: 54 mL/min — ABNORMAL LOW
Glucose, Bld: 90 mg/dL (ref 70–99)
Potassium: 4.2 mEq/L (ref 3.5–5.3)
Sodium: 139 mEq/L (ref 135–145)
Total Bilirubin: 0.7 mg/dL (ref 0.2–1.2)
Total Protein: 7.1 g/dL (ref 6.0–8.3)

## 2014-01-24 LAB — TSH: TSH: 5.214 u[IU]/mL — ABNORMAL HIGH (ref 0.350–4.500)

## 2014-01-24 LAB — PROTIME-INR
INR: 1.11 (ref <1.50)
Prothrombin Time: 14.2 seconds (ref 11.6–15.2)

## 2014-01-24 LAB — APTT: aPTT: 33 seconds (ref 24–37)

## 2014-01-26 ENCOUNTER — Ambulatory Visit (HOSPITAL_COMMUNITY)
Admission: RE | Admit: 2014-01-26 | Discharge: 2014-01-26 | Disposition: A | Discharge status: Home / Self Care | Payer: Medicare Other | Source: Ambulatory Visit | Attending: Cardiovascular Disease | Admitting: Cardiovascular Disease

## 2014-01-26 ENCOUNTER — Encounter (HOSPITAL_COMMUNITY)
Admission: RE | Disposition: A | Discharge status: Home / Self Care | Payer: Self-pay | Source: Ambulatory Visit | Attending: Cardiovascular Disease

## 2014-01-26 DIAGNOSIS — Z7982 Long term (current) use of aspirin: Secondary | ICD-10-CM | POA: Insufficient documentation

## 2014-01-26 DIAGNOSIS — I2510 Atherosclerotic heart disease of native coronary artery without angina pectoris: Secondary | ICD-10-CM | POA: Insufficient documentation

## 2014-01-26 DIAGNOSIS — D509 Iron deficiency anemia, unspecified: Secondary | ICD-10-CM | POA: Insufficient documentation

## 2014-01-26 DIAGNOSIS — I209 Angina pectoris, unspecified: Secondary | ICD-10-CM | POA: Insufficient documentation

## 2014-01-26 DIAGNOSIS — I495 Sick sinus syndrome: Secondary | ICD-10-CM | POA: Insufficient documentation

## 2014-01-26 DIAGNOSIS — I25810 Atherosclerosis of coronary artery bypass graft(s) without angina pectoris: Secondary | ICD-10-CM | POA: Insufficient documentation

## 2014-01-26 DIAGNOSIS — Z87891 Personal history of nicotine dependence: Secondary | ICD-10-CM | POA: Insufficient documentation

## 2014-01-26 DIAGNOSIS — Z9861 Coronary angioplasty status: Secondary | ICD-10-CM | POA: Insufficient documentation

## 2014-01-26 DIAGNOSIS — I10 Essential (primary) hypertension: Secondary | ICD-10-CM | POA: Insufficient documentation

## 2014-01-26 DIAGNOSIS — Z951 Presence of aortocoronary bypass graft: Secondary | ICD-10-CM

## 2014-01-26 DIAGNOSIS — J449 Chronic obstructive pulmonary disease, unspecified: Secondary | ICD-10-CM | POA: Insufficient documentation

## 2014-01-26 DIAGNOSIS — Z950 Presence of cardiac pacemaker: Secondary | ICD-10-CM

## 2014-01-26 DIAGNOSIS — J4489 Other specified chronic obstructive pulmonary disease: Secondary | ICD-10-CM | POA: Insufficient documentation

## 2014-01-26 DIAGNOSIS — I4891 Unspecified atrial fibrillation: Secondary | ICD-10-CM | POA: Insufficient documentation

## 2014-01-26 DIAGNOSIS — Z7902 Long term (current) use of antithrombotics/antiplatelets: Secondary | ICD-10-CM | POA: Insufficient documentation

## 2014-01-26 DIAGNOSIS — I200 Unstable angina: Secondary | ICD-10-CM

## 2014-01-26 HISTORY — PX: LEFT HEART CATHETERIZATION WITH CORONARY/GRAFT ANGIOGRAM: SHX5450

## 2014-01-26 SURGERY — LEFT HEART CATHETERIZATION WITH CORONARY/GRAFT ANGIOGRAM
Anesthesia: LOCAL

## 2014-01-26 MED ORDER — LIDOCAINE HCL (PF) 1 % IJ SOLN
INTRAMUSCULAR | Status: AC
  Filled 2014-01-26: qty 30

## 2014-01-26 MED ORDER — SODIUM CHLORIDE 0.9 % IJ SOLN: 3.0000 mL | Freq: Two times a day (BID) | INTRAMUSCULAR | Status: DC

## 2014-01-26 MED ORDER — HEPARIN (PORCINE) IN NACL 2-0.9 UNIT/ML-% IJ SOLN
INTRAMUSCULAR | Status: AC
  Filled 2014-01-26: qty 1500

## 2014-01-26 MED ORDER — MIDAZOLAM HCL 2 MG/2ML IJ SOLN
INTRAMUSCULAR | Status: AC
  Filled 2014-01-26: qty 2

## 2014-01-26 MED ORDER — ASPIRIN 81 MG PO CHEW
81.0000 mg | CHEWABLE_TABLET | ORAL | Status: AC
  Administered 2014-01-26: 81 mg via ORAL
  Filled 2014-01-26: qty 1

## 2014-01-26 MED ORDER — SODIUM CHLORIDE 0.9 % IJ SOLN: 3.0000 mL | INTRAMUSCULAR | Status: DC | PRN

## 2014-01-26 MED ORDER — NITROGLYCERIN 0.2 MG/ML ON CALL CATH LAB
INTRAVENOUS | Status: AC
  Filled 2014-01-26: qty 1

## 2014-01-26 MED ORDER — SODIUM CHLORIDE 0.9 % IV SOLN: 250.0000 mL | INTRAVENOUS | Status: DC | PRN

## 2014-01-26 MED ORDER — FENTANYL CITRATE 0.05 MG/ML IJ SOLN
INTRAMUSCULAR | Status: AC
  Filled 2014-01-26: qty 2

## 2014-01-26 MED ORDER — SODIUM CHLORIDE 0.9 % IV SOLN
INTRAVENOUS | Status: DC
Start: 2014-01-26 — End: 2014-01-26

## 2014-01-26 MED ORDER — SODIUM CHLORIDE 0.9 % IV SOLN
INTRAVENOUS | Status: DC
  Administered 2014-01-26: 06:00:00 via INTRAVENOUS

## 2014-01-26 NOTE — Interval H&P Note (Signed)
Cath Lab Visit (complete for each Cath Lab visit)  Clinical Evaluation Leading to the Procedure:   ACS: no  Non-ACS:    Anginal Classification: CCS III  Anti-ischemic medical therapy: Maximal Therapy (2 or more classes of medications)  Non-Invasive Test Results: No non-invasive testing performed  Prior CABG: Previous CABG      History and Physical Interval Note:  01/26/2014 7:44 AM  Renelda Mom Mullenbach  has presented today for surgery, with the diagnosis of unstable angia  The various methods of treatment have been discussed with the patient and family. After consideration of risks, benefits and other options for treatment, the patient has consented to  Procedure(s): LEFT HEART CATHETERIZATION WITH CORONARY/GRAFT ANGIOGRAM (N/A) as a surgical intervention .  The patient's history has been reviewed, patient examined, no change in status, stable for surgery.  I have reviewed the patient's chart and labs.  Questions were answered to the patient's satisfaction.     KELLY,THOMAS A

## 2014-01-26 NOTE — H&P (View-Only) (Signed)
01/23/2014 [name]   06-06-1924  [id number]  Primary [place], MD Primary Cardiologist: Dr Claiborne Billings  HPI:  [name] is an 80 y/o who has known coronary artery disease. In 1989 he underwent CABG with an LIMA to the LAD and a vein to the circumflex and intermediate artery. In 2004 he underwent stenting to his right artery. His last cardiac catheterization in January 2013 showed an ejection fraction of 50%.. He had significant native CAD with 80% stenosis of the LAD after the second diagonal vessel and septal perforator artery with total occlusion of the mid LAD. He had 67% ostial stenosis in the circumflex vessel followed by 40% proximal stenosis and 70-80% distal stenosis. He had an occluded Y graft which previously sequentially supplied the intermediate and distal circumflex. The previously stented proximal right coronary artery had a 90% stenosis in the anterior RV marginal branch arising from the mid RCA. There was a 70-80% stenosis in the distal RCA beyond the crux with 78% stenosis the PDA takeoff. His LIMA to LAD was patent. He has been on medical therapy. He has a history of atrial fibrillation with sick sinus syndrome and status post permanent St. Jude pacemaker in 2009. Additional problems include iron deficiency anemia, hypertension, BPH, status post TURP in 1992, remote history of liver abscess, and a history of COPD.              He is in the office today with his wife. He notes increasing episodes of angina requiring NTG. Some of these episode are exertional, such as walking up steps from the basement. Some episodes are at rest. He took NTG last night before bed for chest pain. He denies any associated diaphoresis or radiation but he admits to some dyspnea. When describing the pain he gives a Levine sign and calls it "tightness". Ntg has relived his symptoms so far.      Current Outpatient Prescriptions  Medication Sig Dispense Refill  . alfuzosin (UROXATRAL) 10  MG 24 hr tablet Take 10 mg by mouth daily.      Marland Kitchen amLODipine (NORVASC) 10 MG tablet Take 1 tablet (10 mg total) by mouth daily.  90 tablet  3  . aspirin 81 MG tablet Take 81 mg by mouth daily.        . carvedilol (COREG) 25 MG tablet Take 25 mg by mouth 2 (two) times daily with a meal.        . clopidogrel (PLAVIX) 75 MG tablet Take 75 mg by mouth daily.        . ferrous sulfate 325 (65 FE) MG tablet Take 325 mg by mouth 3 (three) times daily with meals.       . furosemide (LASIX) 20 MG tablet Take 20 mg by mouth daily.        . isosorbide mononitrate (IMDUR) 60 MG 24 hr tablet 60 mg. Take 60 mg in the AM and 30 mg in the PM      . KLOR-CON 10 10 MEQ tablet 10 mEq daily.       Marland Kitchen lubiprostone (AMITIZA) 24 MCG capsule Take 24 mcg by mouth daily as needed. For bowel movement      . nitroGLYCERIN (NITROSTAT) 0.4 MG SL tablet Place 1 tablet (0.4 mg total) under the tongue as needed.  25 tablet  6  . pantoprazole (PROTONIX) 40 MG tablet Take 40 mg by mouth 2 (two) times daily.      . ranolazine (RANEXA) 1000 MG  SR tablet Take 1 tablet (1,000 mg total) by mouth 2 (two) times daily.  180 tablet  2  . rosuvastatin (CRESTOR) 20 MG tablet Take 1 tablet (20 mg total) by mouth daily.  14 tablet  0   No current facility-administered medications for this visit.    No Known Allergies  History   Social History  . Marital Status: Married    Spouse Name: N/A    Number of Children: 29  . Years of Education: N/A   Occupational History  . RETIRED     TRUCK DRIVER   Social History Main Topics  . Smoking status: Former Smoker -- 2.00 packs/day for 65 years    Types: Cigarettes  . Smokeless tobacco: Former Systems developer    Quit date: 10/29/2008  . Alcohol Use: No  . Drug Use: No  . Sexual Activity: Not on file   Other Topics Concern  . Not on file   Social History Narrative  . No narrative on file     Review of Systems: General: negative for chills, fever, night sweats or weight changes.    Cardiovascular: negative for chest pain, dyspnea on exertion, edema, orthopnea, palpitations, paroxysmal nocturnal dyspnea or shortness of breath Dermatological: negative for rash Respiratory: negative for cough or wheezing Urologic: negative for hematuria Abdominal: negative for nausea, vomiting, diarrhea, bright red blood per rectum, melena, or hematemesis Neurologic: negative for visual changes, syncope, or dizziness All other systems reviewed and are otherwise negative except as noted above.    Blood pressure 128/70, pulse 65, height 5\' 9"  (1.753 m), weight 183 lb (83.008 kg).  General appearance: alert, cooperative and no distress Neck: no carotid bruit and no JVD Lungs: clear to auscultation bilaterally Heart: regular rate and rhythm Abdomen: obese Extremities: no edema Pulses: diminnished Skin: warm and dry Neurologic: Grossly normal  EKG V paced  ASSESSMENT AND PLAN:   Unstable angina Increasing chest pain and NTG use.   S/P CABG x 2 '89. RCA stent'04. last cath Jan 2013 .  PAF (paroxysmal atrial fibrillation) .  PACEMAKER, PERMANENT- St Jude Sept 2009 .  HYPERTENSION .  History of iron deficiency anemia .   PLAN  Discussed with Dr Claiborne Billings- increase medical Rx. Plan for OP cath later this week.  John Peck KPA-C 01/23/2014 1:25 PM

## 2014-01-26 NOTE — Discharge Instructions (Signed)

## 2014-01-28 NOTE — CV Procedure (Signed)
John Peck is a 80 y.o. male    [id number]  [id number] LOCATION:  FACILITY: [facility]  PHYSICIAN: Troy Sine, MD, [facility] 1924/02/12   DATE OF PROCEDURE:  01/28/2014    CARDIAC CATHETERIZATION     HISTORY: John Peck is an 80 year old African American male who underwent CABG revascularization surgery in 1989 with LIMA to the LAD, and Y graft to the circumflex and intermediate vessel. He has been documented to have an occluded Y graft and he is also status post stenting of his proximal RCA. He has a history of sick sinus syndrome with PAF, and is status post permanent pacemaker insertion. He has developed recent increasing symptoms of chest pain. He was seen in the office early in the week and because of progressive symptoms definitive repeat cardiac catheterization was recommended.   PROCEDURE:  The patient was brought to the second floor [facility] cath lab in the postabsorptive state. He was premedicated with Versed 1 mg and fentanyl 25 mcg. His right groin was prepped and shaved in usual sterile fashion. Xylocaine 1% was used for local anesthesia. A 5 French sheath was inserted into the right femoral artery. Diagnostic catheterizatiion was done with 5 [place] LF4, FR4,LIMA and pigtail catheters. Left ventriculography was done with 25 cc Omnipaque contrast. Hemostasis was obtained by direct manual compression. The patient tolerated the procedure well.   HEMODYNAMICS:   Central Aorta: 140/65   Left Ventricle: 140/19  ANGIOGRAPHY:  Left main coronary artery was angiographically normal and seen to bifurcate into LAD and left circumflex system. There is no visualization of the intermediate vessel antegrade.  Left anterior descending artery had 50% ostial stenosis. There was an 80% stenosis in the region of the first diagonal takeoff. After the second diagonal vessel and septal perforator artery there was diffuse 80% stenosis. It was collateralization from this segment  to the intermediate vessel.  The intermediate vessel was occluded.  The left circumflex vessel had 40% ostial stenosis.  The RCA had a widely patent proximal stent. Anterior marginal branch arose beyond the stented segment and had 80% eccentric stenosis which was old in this branch vessel. The RCA in its midsegment had 40% stenosis. There was 50% stenosis beyond the acute margin. It was 50% ostial PDA stenosis and mid PDA stenosis. There was collateralization from the posterior lateral branch of his right eye artery to the intermediate vessel retrograde.  There was mild irregularity of the proximal subclavian. The LIMA graft was widely patent anastomosis into the mid LAD. The distal LAD was small caliber and did not have high-grade stenoses. There is mild irregularity.  The Y graft was occluded and not visualized.  Left ventriculography revealed an ejection fraction of 45%. With mild midinferior wall hypocontractility.   IMPRESSION:  Mild LV dysfunction with an ejection fraction of 45% and inferior wall hypocontractility.  Significant multivessel native coronary obstructive disease with 50% ostial LAD stenosis followed by 80% proximal stenosis in the region of the first diagonal takeoff followed by 80% stenosis after the second diagonal vessel; total occlusion of the ramus intermediate vessel with evidence for bidirectional retrograde collateralization; 40% ostial left circumflex stenosis; and widely patent proximal RCA stent with 80% stenosis in the anterior RV marginal branch, 40% mid or CVA stenosis, 50% distal, and 50% ostial and mid PDA stenoses.  Patent LIMA graft supplying the mid LAD  Occluded Y graft which previously supplied the intermediate and distal circumflex.  RECOMMENDATION:  Increased medical therapy.  Troy Sine,  MD, [facility] 01/28/2014 10:57 AM

## 2014-01-29 ENCOUNTER — Encounter: Payer: Self-pay | Admitting: Cardiovascular Disease

## 2014-02-08 ENCOUNTER — Other Ambulatory Visit: Payer: Self-pay

## 2014-02-08 DIAGNOSIS — Z951 Presence of aortocoronary bypass graft: Secondary | ICD-10-CM

## 2014-02-08 MED ORDER — ISOSORBIDE MONONITRATE ER 60 MG PO TB24: ORAL_TABLET | ORAL | Status: DC

## 2014-02-08 NOTE — Telephone Encounter (Signed)
Rx was sent to pharmacy electronically. 

## 2014-03-26 ENCOUNTER — Encounter: Payer: Self-pay | Admitting: Internal Medicine

## 2014-03-26 DIAGNOSIS — I495 Sick sinus syndrome: Secondary | ICD-10-CM

## 2014-04-04 ENCOUNTER — Telehealth: Payer: Self-pay | Admitting: Cardiovascular Disease

## 2014-04-04 MED ORDER — RANOLAZINE ER 1000 MG PO TB12: 1000.0000 mg | ORAL_TABLET | Freq: Two times a day (BID) | ORAL | Status: DC

## 2014-04-04 NOTE — Telephone Encounter (Signed)
Please call 90 day supply of Ranexa 1000 mg 1 twice a day to [place].  Please let patient know when this has been done.

## 2014-04-20 ENCOUNTER — Other Ambulatory Visit (HOSPITAL_BASED_OUTPATIENT_CLINIC_OR_DEPARTMENT_OTHER): Payer: Medicare Other

## 2014-04-20 ENCOUNTER — Other Ambulatory Visit: Payer: Medicare Other

## 2014-04-20 DIAGNOSIS — Z862 Personal history of diseases of the blood and blood-forming organs and certain disorders involving the immune mechanism: Secondary | ICD-10-CM

## 2014-04-20 DIAGNOSIS — D509 Iron deficiency anemia, unspecified: Secondary | ICD-10-CM

## 2014-04-20 LAB — FERRITIN CHCC: FERRITIN: 259 ng/mL (ref 22–316)

## 2014-04-20 LAB — CBC WITH DIFFERENTIAL/PLATELET
BASO%: 0.6 % (ref 0.0–2.0)
Basophils Absolute: 0 10*3/uL (ref 0.0–0.1)
EOS%: 5.9 % (ref 0.0–7.0)
Eosinophils Absolute: 0.2 10*3/uL (ref 0.0–0.5)
HEMATOCRIT: 35.9 % — AB (ref 38.4–49.9)
HEMOGLOBIN: 12 g/dL — AB (ref 13.0–17.1)
LYMPH#: 1 10*3/uL (ref 0.9–3.3)
LYMPH%: 25.2 % (ref 14.0–49.0)
MCH: 32.2 pg (ref 27.2–33.4)
MCHC: 33.4 g/dL (ref 32.0–36.0)
MCV: 96.4 fL (ref 79.3–98.0)
MONO#: 0.6 10*3/uL (ref 0.1–0.9)
MONO%: 14.8 % — ABNORMAL HIGH (ref 0.0–14.0)
NEUT#: 2.2 10*3/uL (ref 1.5–6.5)
NEUT%: 53.5 % (ref 39.0–75.0)
Platelets: 146 10*3/uL (ref 140–400)
RBC: 3.72 10*6/uL — ABNORMAL LOW (ref 4.20–5.82)
RDW: 14.6 % (ref 11.0–14.6)
WBC: 4.2 10*3/uL (ref 4.0–10.3)

## 2014-04-20 LAB — IRON AND TIBC CHCC
%SAT: 34 % (ref 20–55)
Iron: 70 ug/dL (ref 42–163)
TIBC: 208 ug/dL (ref 202–409)
UIBC: 137 ug/dL (ref 117–376)

## 2014-05-16 ENCOUNTER — Telehealth: Payer: Self-pay | Admitting: Cardiovascular Disease

## 2014-05-16 NOTE — Telephone Encounter (Signed)
Needing a new prescription for Crestor sent to [facility] on [address] .Marland Kitchen They are no longer getting it thru mail order . Please call if you have any questions    Thanks

## 2014-05-17 MED ORDER — ROSUVASTATIN CALCIUM 20 MG PO TABS: 20.0000 mg | ORAL_TABLET | Freq: Every day | ORAL | Status: DC

## 2014-05-17 NOTE — Telephone Encounter (Signed)
Rx refill sent to patient pharmacy   

## 2014-05-18 ENCOUNTER — Telehealth: Payer: Self-pay | Admitting: Cardiovascular Disease

## 2014-05-18 MED ORDER — ROSUVASTATIN CALCIUM 20 MG PO TABS: 20.0000 mg | ORAL_TABLET | Freq: Every day | ORAL | Status: DC

## 2014-05-18 NOTE — Telephone Encounter (Signed)
Rx was sent to pharmacy electronically. 

## 2014-05-18 NOTE — Telephone Encounter (Signed)
Pt wife called pharmacy twice and they said they have not received his Crestor prescription. Pt is completely out of medicine,please call it in today to [facility] on [address].

## 2014-05-23 ENCOUNTER — Other Ambulatory Visit: Payer: Self-pay | Admitting: *Deleted

## 2014-05-23 MED ORDER — ROSUVASTATIN CALCIUM 20 MG PO TABS: 20.0000 mg | ORAL_TABLET | Freq: Every day | ORAL | Status: DC

## 2014-06-14 ENCOUNTER — Telehealth: Payer: Self-pay

## 2014-06-14 NOTE — Telephone Encounter (Signed)
Patient's wife waked in office requesting samples of Ranexa 1000 mg.Samples of Ranexa 1000 mg given to wife.Patient assistant form for Ranexa was given to our pharmacist Kristen.

## 2014-06-25 ENCOUNTER — Telehealth: Payer: Self-pay | Admitting: Cardiovascular Disease

## 2014-06-25 DIAGNOSIS — I4891 Unspecified atrial fibrillation: Secondary | ICD-10-CM

## 2014-06-25 NOTE — Telephone Encounter (Signed)
Returned call to patient's wife she stated husband is weak and has been falling due to weakness.Stated Isosorbide increased 60 mg 2 tablets am and 1 tablet pm.Stated this was increased some time ago and she thought this could be causing weakness.No chest pain. No sob.Stated she would like appointment for husband.Advised to continue isosorbide and other medications.Appointment scheduled with Lyda Jester PA 07/02/14 at 8:00 am.Appointment with Dr.Kelly moved up to 07/17/14.Advised to go to ER if needed.

## 2014-06-25 NOTE — Telephone Encounter (Signed)
The pt's wife called in stating that she wasn't aware that John Peck's medication had been increased and she would like to know why. She said that before he was only taking 1 tab in the am and 1/2 at night and now he is taking 2 in the am and 1 at night and she would like to know is this dosage increase the reason why he is drowsy a lot more than often. Please call  Thanks

## 2014-07-02 ENCOUNTER — Ambulatory Visit (INDEPENDENT_AMBULATORY_CARE_PROVIDER_SITE_OTHER): Payer: Medicare Other | Admitting: Cardiology

## 2014-07-02 ENCOUNTER — Encounter: Payer: Self-pay | Admitting: Cardiology

## 2014-07-02 VITALS — BP 100/60 | HR 59 | Ht 69.0 in | Wt 180.6 lb

## 2014-07-02 DIAGNOSIS — IMO0001 Reserved for inherently not codable concepts without codable children: Secondary | ICD-10-CM

## 2014-07-02 DIAGNOSIS — I25811 Atherosclerosis of native coronary artery of transplanted heart without angina pectoris: Secondary | ICD-10-CM

## 2014-07-02 DIAGNOSIS — Z79899 Other long term (current) drug therapy: Secondary | ICD-10-CM

## 2014-07-02 LAB — COMPREHENSIVE METABOLIC PANEL
ALBUMIN: 4.1 g/dL (ref 3.5–5.2)
AST: 10 U/L (ref 0–37)
Alkaline Phosphatase: 80 U/L (ref 39–117)
BUN: 21 mg/dL (ref 6–23)
CALCIUM: 8.8 mg/dL (ref 8.4–10.5)
CHLORIDE: 103 meq/L (ref 96–112)
CO2: 26 meq/L (ref 19–32)
CREATININE: 1.36 mg/dL — AB (ref 0.50–1.35)
Glucose, Bld: 91 mg/dL (ref 70–99)
POTASSIUM: 4.3 meq/L (ref 3.5–5.3)
Sodium: 137 mEq/L (ref 135–145)
TOTAL PROTEIN: 7.2 g/dL (ref 6.0–8.3)
Total Bilirubin: 0.7 mg/dL (ref 0.2–1.2)

## 2014-07-02 LAB — CK: CK TOTAL: 59 U/L (ref 7–232)

## 2014-07-02 NOTE — Patient Instructions (Signed)
Your physician has requested that you have a lower extremity arterial exercise duplex. During this test, exercise and ultrasound are used to evaluate arterial blood flow in the legs. Allow one hour for this exam. There are no restrictions or special instructions.  Your physician recommends that you return for lab work .  Your physician has recommended you make the following change in your medication: decrease the amlodipine to 5 mg daily.  Your physician recommends that you schedule a follow-up appointment in: keep the appointment scheduled with Dr. Claiborne Billings on 07/17/14.

## 2014-07-02 NOTE — Progress Notes (Signed)
07/02/2014 John Peck   [birth year]  [id number]  Primary McGrath, MD Primary Cardiologist: Dr. Claiborne Billings  HPI:  [name] is an 80 year old African American male who underwent CABG revascularization surgery in 1989 with a LIMA to the LAD and Y graft to the circumflex and intermediate vessel. He has been documented to have an occluded Y graft and he is also status post stenting of his proximal RCA. He has a history of sick sinus syndrome with PAF, and is status post permanent pacemaker insertion. His primary cardiologist is Dr. Claiborne Billings. Dr. Lovena Le follows his PPM.   He recently underwent a repeat LHC in March of this year due to increasing symptoms of chest pain.  This was performed by Dr. Claiborne Billings. He was found to have significant multivessel native coronary obstructive disease with 50% ostial LAD stenosis followed by 80% proximal stenosis in the region of the first diagonal takeoff followed by 80% stenosis after the second diagonal vessel; total occlusion of the ramus intermediate vessel with evidence for bidirectional retrograde collateralization; 40% ostial left circumflex stenosis; and widely patent proximal RCA stent with 80% stenosis in the anterior RV marginal branch, 40% mid or CVA stenosis, 50% distal, and 50% ostial and mid PDA stenoses. He had a patent LIMA graft supplying the mid LAD and an occcluded Y graft which previously supplied the intermediate and distal circumflex. He was noted to have mild LV dysfunction with an ejection fraction of 45% and inferior wall hypocontractility. Increased medical therapy was recommended. He has been treated with the following medications: 81 mg Aspirin, 75 mg of Plavix, 25 mg of carvedilol twice a day, high-dose oral nitrate therapy with 120 mg of Imdur in the morning and 60 mg of Imdur in the evening, 1000 mg of Ranexa twice a day, 10 mg of amlodipine daily as well as 20 mg of Crestor. He also takes 20 mg of Lasix once daily with  supplemental potassium.  He presents to clinic today with several complaints. He is accompanied by his wife. His primary complaint is localized weakness in his bilateral lower extremities, worse on the right side. The discomfort is primarily from his buttocks/hips down to his proximal knee, however he also notes occasional burning/cramping in his calf muscles down to his feet. Symptoms are worse with physical activity however he also has occasional symptoms at rest. He often can only walk short distances before having to stop to rest due to leg discomfort/fatigue. He also notes frequent intremittent cramping in his right hand/ fingers.  He feels that his symptoms may be secondary to arthritis. He does have a past history of tobacco abuse, having smoked for 60+ years. He quit nearly 10 years ago. No h/o of DM.  His second concern is persistently low blood pressures. This is also a major concern for his wife and she has noticed that his blood pressure at home has been in the low 754G systolic. He notes occasional dizziness if he changes positions too quickly however he denies dizziness on any other occasions. He denies syncope/near-syncope. Blood pressure in clinic today is 100/60. His heart rate is 59 beats per minute. He denies any associated chest pain. He has not required any use of sublingual nitroglycerin. He also denies dyspnea, orthopnea, PND and lower extremity edema.     Current Outpatient Prescriptions  Medication Sig Dispense Refill  . alfuzosin (UROXATRAL) 10 MG 24 hr tablet Take 10 mg by mouth daily.      Marland Kitchen amLODipine (  NORVASC) 10 MG tablet Take 5 mg by mouth daily.      Marland Kitchen aspirin 81 MG tablet Take 81 mg by mouth daily.       . carvedilol (COREG) 25 MG tablet Take 25 mg by mouth 2 (two) times daily with a meal.        . clopidogrel (PLAVIX) 75 MG tablet Take 75 mg by mouth daily.        . ferrous sulfate 325 (65 FE) MG tablet Take 325 mg by mouth 3 (three) times daily with meals.       .  furosemide (LASIX) 20 MG tablet Take 20 mg by mouth daily.        . isosorbide mononitrate (IMDUR) 60 MG 24 hr tablet Take 2 tablets (120 mg total) by mouth every morning and 1 tablet (60 mg total) by mouth in the evening.  90 tablet  11  . KLOR-CON 10 10 MEQ tablet Take 10 mEq by mouth daily.       Marland Kitchen lubiprostone (AMITIZA) 24 MCG capsule Take 24 mcg by mouth daily as needed. For bowel movement      . Menthol, Topical Analgesic, (ICY HOT EX) Apply 1 application topically daily as needed (aches and pains).      . nitroGLYCERIN (NITROSTAT) 0.4 MG SL tablet Place 1 tablet (0.4 mg total) under the tongue as needed.  25 tablet  6  . pantoprazole (PROTONIX) 40 MG tablet Take 40 mg by mouth 2 (two) times daily.      . ranolazine (RANEXA) 1000 MG SR tablet Take 1 tablet (1,000 mg total) by mouth 2 (two) times daily.  180 tablet  3  . rosuvastatin (CRESTOR) 20 MG tablet Take 1 tablet (20 mg total) by mouth daily.  30 tablet  8   No current facility-administered medications for this visit.    No Known Allergies  History   Social History  . Marital Status: Married    Spouse Name: N/A    Number of Children: 28  . Years of Education: N/A   Occupational History  . RETIRED     TRUCK DRIVER   Social History Main Topics  . Smoking status: Former Smoker -- 2.00 packs/day for 65 years    Types: Cigarettes  . Smokeless tobacco: Former Systems developer    Quit date: 10/29/2008  . Alcohol Use: No  . Drug Use: No  . Sexual Activity: Not on file   Other Topics Concern  . Not on file   Social History Narrative  . No narrative on file     Review of Systems: General: negative for chills, fever, night sweats or weight changes.  Cardiovascular: negative for chest pain, dyspnea on exertion, edema, orthopnea, palpitations, paroxysmal nocturnal dyspnea or shortness of breath Dermatological: negative for rash Respiratory: negative for cough or wheezing Urologic: negative for hematuria Abdominal: negative for  nausea, vomiting, diarrhea, bright red blood per rectum, melena, or hematemesis Neurologic: negative for visual changes, syncope, or dizziness All other systems reviewed and are otherwise negative except as noted above.    Blood pressure 100/60, pulse 59, height 5\' 9"  (1.753 m), weight 180 lb 9.6 oz (81.92 kg).  General appearance: alert, cooperative and no distress Neck: no carotid bruit and no JVD Lungs: clear to auscultation bilaterally Heart: regular rate and rhythm Extremities: no LEE, no knee crepitus noted Pulses: 2+ radials; decreased distal pulses bilaterally Skin: Skin color, texture, turgor normal. No rashes or lesions Neurologic: Grossly normal  EKG NSR 59  bpm  ASSESSMENT AND PLAN:   1. Bilateral lower extremity pain/weakness: Also associated with occasional cramping and decreased exercise tolerance. Differential diagnosis includes hypokalemia vs myalgias/myositis vs peripheral arterial disease vs arthritis.    Hypokalemia- he is on chronic diuretic therapy with Lasix and he reports daily compliance with supplemental potassium, however given complaints of muscle weakness with associated cramping, we will assess electrolytes with lab work (CMET) to r/o hypokalemia    Myalgias/myositis-he notes muscle/joint pain in the setting of statin therapy, treated with Crestor. We'll check a CK, as well as a CMET so that hepatic function can be analyzed.    Peripheral arterial disease- bilateral leg pain/weakness/associated burning from buttocks to distal extremities, worse with exercise and with subsequent decreased exercise tolerance, in the setting of known CAD and prior long-term history of tobacco use. His physical exam is notable for decreased distal pulses bilaterally. I recommended that he be evaluated with bilateral lower extremity arterial duplex studies. If abnormal, will refer to Dr. Gwenlyn Found for further evaluation.    Arthritis- osteoarthritis may be a possibility of his  joint pain given his age, however joint crepitus was not appreciated with range of motion activities on physical exam.   2. Borderline hypotension: Patient notes low systloic blood pressures at home. His blood pressure today in clinic is 100/60. He only appears to be symptomatic if he changes positions too quickly. He denies any symptoms during prolonged standing/ambulation. No syncope/near-syncope. However, his wife has major concerns regarding his stability during ambulation. He is on multiple antianginals that also lower blood pressure including Coreg, high-dose Imdur and amlodipine. He states that his angina has been well controlled on these agents. Given his CAD, I feel that he will receive the most overall benefit from continuation of his beta blocker as well as nitrate therapy. I have recommended that we reduce his amlodipine from 10 mg down to 5 mg to allow for increases in his blood pressure. He has been instructed to continue to monitor his blood pressure at home.    PLAN  Will pursue w/u for problem #1 as outlined in detail above with basic labs (CMET and CK) as well as LEAs. Will reduce amlodipine down to 5 mg in an effort to improve problem #2. Otherwise, continue all other meds as directed. He was instructed to keep his f/u appt with Dr. Claiborne Billings on 07/17/14. He was encouraged to get LEAs done prior to appt so that Dr. Claiborne Billings can review results and refer to Dr. Gwenlyn Found if indicated.   SIMMONS, BRITTAINYPA-C 07/02/2014 8:48 AM

## 2014-07-04 ENCOUNTER — Other Ambulatory Visit: Payer: Self-pay

## 2014-07-04 MED ORDER — ISOSORBIDE MONONITRATE ER 60 MG PO TB24: 60.0000 mg | ORAL_TABLET | Freq: Every day | ORAL | Status: DC

## 2014-07-05 ENCOUNTER — Other Ambulatory Visit: Payer: Self-pay | Admitting: *Deleted

## 2014-07-05 MED ORDER — ISOSORBIDE MONONITRATE ER 60 MG PO TB24: 60.0000 mg | ORAL_TABLET | Freq: Every day | ORAL | Status: DC

## 2014-07-05 NOTE — Telephone Encounter (Signed)
Rx refill sent to patient pharmacy   

## 2014-07-05 NOTE — Progress Notes (Signed)
Labs sent to Dr Claiborne Billings for review

## 2014-07-09 ENCOUNTER — Other Ambulatory Visit: Payer: Self-pay | Admitting: *Deleted

## 2014-07-09 ENCOUNTER — Ambulatory Visit (HOSPITAL_COMMUNITY)
Admission: RE | Admit: 2014-07-09 | Discharge: 2014-07-09 | Disposition: A | Discharge status: Home / Self Care | Payer: Medicare Other | Source: Ambulatory Visit | Attending: Cardiology | Admitting: Cardiology

## 2014-07-09 DIAGNOSIS — I2510 Atherosclerotic heart disease of native coronary artery without angina pectoris: Secondary | ICD-10-CM | POA: Diagnosis not present

## 2014-07-09 DIAGNOSIS — IMO0001 Reserved for inherently not codable concepts without codable children: Secondary | ICD-10-CM | POA: Diagnosis present

## 2014-07-09 DIAGNOSIS — I70219 Atherosclerosis of native arteries of extremities with intermittent claudication, unspecified extremity: Secondary | ICD-10-CM | POA: Diagnosis present

## 2014-07-09 DIAGNOSIS — Z9861 Coronary angioplasty status: Secondary | ICD-10-CM | POA: Insufficient documentation

## 2014-07-09 DIAGNOSIS — Z951 Presence of aortocoronary bypass graft: Secondary | ICD-10-CM | POA: Insufficient documentation

## 2014-07-09 DIAGNOSIS — I10 Essential (primary) hypertension: Secondary | ICD-10-CM | POA: Insufficient documentation

## 2014-07-09 NOTE — Progress Notes (Signed)
Arterial Duplex Lower Ext. Completed. Charina Fons, BS, RDMS, RVT  

## 2014-07-17 ENCOUNTER — Ambulatory Visit (INDEPENDENT_AMBULATORY_CARE_PROVIDER_SITE_OTHER): Payer: Medicare Other | Admitting: Cardiovascular Disease

## 2014-07-17 ENCOUNTER — Encounter: Payer: Self-pay | Admitting: Cardiovascular Disease

## 2014-07-17 VITALS — BP 148/76 | HR 61 | Ht 68.0 in | Wt 180.0 lb

## 2014-07-17 DIAGNOSIS — I4891 Unspecified atrial fibrillation: Secondary | ICD-10-CM

## 2014-07-17 DIAGNOSIS — I480 Paroxysmal atrial fibrillation: Secondary | ICD-10-CM

## 2014-07-17 DIAGNOSIS — I70219 Atherosclerosis of native arteries of extremities with intermittent claudication, unspecified extremity: Secondary | ICD-10-CM

## 2014-07-17 DIAGNOSIS — Z951 Presence of aortocoronary bypass graft: Secondary | ICD-10-CM

## 2014-07-17 DIAGNOSIS — I495 Sick sinus syndrome: Secondary | ICD-10-CM

## 2014-07-17 DIAGNOSIS — I10 Essential (primary) hypertension: Secondary | ICD-10-CM

## 2014-07-17 NOTE — Progress Notes (Signed)
Patient ID: John Peck, male   DOB: 09-13-1924, 80 y.o.   MRN: [id number]     HPI: John Peck is a 80 y.o. male who presents to the office today for a one-year cardiology evaluation.  John Peck has known coronary artery disease and in 1989 underwent CABG surgery with LIMA to the LAD, a vein to the circumflex. In 2004 he underwent stenting to his RCA. Cardiac catheterization in January 2013 showed an ejection fraction of 50% with mild inferior, distal inferior and apical lateral hypocontractility. He had significant native CAD with 80% stenosis of the LAD after the second diagonal vessel and septal perforator artery with total occlusion of the mid LAD. He was 94 - 70% ostial stenosis in the circumflex vessel followed by 40% proximal stenosis and 7080% distal stenosis. He had an occluded Y graft which previously sequentially supplied the intermediate and distal circumflex.There was a patent stented proximal right coronary artery with 90% stenosis in the anterior RV marginal branch arising from the mid RCA and had 70-80% stenosis in the distal RCA beyond the crux with 78% stenosis the PDA takeoff. His LIMA to LAD was patent. He has been on medical therapy. He has a history of atrial fibrillation, sick sinus syndrome and status post permanent pacemaker in 2009 which is a [employer]. Jude device. Additional problems include iron deficiency anemia, hypertension, BPH, status post TURP in 1992, remote history of liver abscess, history of COPD.  In March 2015 due to recurrent increasing symptoms of chest pain.  He underwent repeat cardiac catheterization.  He was noted to have mild LV dysfunction with an EF of 45% inferior wall hypocontractility. There was significant multivessel native coronary obstructive disease with 50% ostial LAD stenosis followed by 80% proximal stenosis in the region of the first diagonal takeoff followed by 80% stenosis after the second diagonal vessel; total occlusion of the ramus  intermediate vessel with evidence for bidirectional retrograde collateralization; 40% ostial left circumflex stenosis; and widely patent proximal RCA stent with 80% stenosis in the anterior RV marginal branch, 40% mid or CVA stenosis, 50% distal, and 50% ostial and mid PDA stenoses. Patent LIMA graft supplying the mid LAD  And occluded Y graft which previously supplied the intermediate and distal circumflex.  He has been on increased medical regimen consisting of aspirin, Plavix, carvedilol 25 mg twice a day, high dose oral nitrate therapy, Ranexa 1000 g twice a day.  Amlodipine 10 mg in addition to Crestor 20 mg.  He also is taking Lasix 20 mg with supplemental potassium.  He is seen in the office last month by Lesia Hausen and admitted to bilateral lower extremity pain and weakness.  His blood pressure was somewhat low and his amlodipine dose was reduced from 10-5 mg.  He underwent lower Schembri Doppler studies.  I reviewed these with him in detail today.  These show an ABI of 0.5 on the right and 0.58 on the left.  He had mild dilatation of his distal abdominal aorta.  There was equal to less than 50% diameter reduction.  The right SFA and right popliteal artery and there was a greater than 60% diameter reduction in the left SFA.  However, there was one vessel runoff via the.  Peroneal artery bilaterally.  His symptoms are worse in the right lower extremity due to his distal disease.  Past Medical History  Diagnosis Date  . Hypertension   . Pacemaker Sept 2009    St Jude  . CAD (coronary artery  disease)     CABG '89, PCI '04  . Intermediate coronary syndrome   . Arrhythmia     afib, bradycardia  . Diastolic dysfunction   . Anemia   . Sick sinus syndrome Sept 2009    ST Jude PTVDP  . History of COPD   . H/O: GI bleed     REMOTE HISTORY  . Hyperlipidemia     Past Surgical History  Procedure Laterality Date  . Coronary artery bypass graft  1989    had LIMA to his LAD, a vein to the  circumflex. In 2004 underwent stenting to his right coronary artery.  . Cardiac catheterization  11/2011    EF 50%; significant native CAD w/80% stenosis of the LAD after 2nd giagonal vessel and septal perforating artery w/total occlusion of the mid left anterior descendiung.; 60-70% ostiasl stenosis in the circumflex vessel followed by 40% proximal stenosis and 70-80% distal circumflex stenosis;   . Cataract extraction  2004  . Prostatectomy    . Hemorrhoid surgery    . Insert / replace / remove pacemaker  07/17/08    DUAL-CHAMBER; PPM-ST.JUDE MEDNET  . Coronary angioplasty with stent placement  2004    RCA    No Known Allergies  Current Outpatient Prescriptions  Medication Sig Dispense Refill  . alfuzosin (UROXATRAL) 10 MG 24 hr tablet Take 10 mg by mouth daily.      Marland Kitchen amLODipine (NORVASC) 10 MG tablet Take 5 mg by mouth daily.      Marland Kitchen aspirin 81 MG tablet Take 81 mg by mouth daily.       . carvedilol (COREG) 25 MG tablet Take 25 mg by mouth 2 (two) times daily with a meal.        . clopidogrel (PLAVIX) 75 MG tablet Take 75 mg by mouth daily.        . ferrous sulfate 325 (65 FE) MG tablet Take 325 mg by mouth 3 (three) times daily with meals.       . furosemide (LASIX) 20 MG tablet Take 20 mg by mouth daily.        . isosorbide mononitrate (IMDUR) 60 MG 24 hr tablet Take 1 tablet (60 mg total) by mouth daily. Take 2 tablets (120 mg total) by mouth every morning and 1 tablet (60 mg total) by mouth in the evening.  270 tablet  2  . KLOR-CON 10 10 MEQ tablet Take 10 mEq by mouth daily.       Marland Kitchen lubiprostone (AMITIZA) 24 MCG capsule Take 24 mcg by mouth daily as needed. For bowel movement      . Menthol, Topical Analgesic, (ICY HOT EX) Apply 1 application topically daily as needed (aches and pains).      . nitroGLYCERIN (NITROSTAT) 0.4 MG SL tablet Place 1 tablet (0.4 mg total) under the tongue as needed.  25 tablet  6  . pantoprazole (PROTONIX) 40 MG tablet Take 40 mg by mouth 2 (two) times  daily.      . ranolazine (RANEXA) 1000 MG SR tablet Take 1 tablet (1,000 mg total) by mouth 2 (two) times daily.  180 tablet  3  . rosuvastatin (CRESTOR) 20 MG tablet Take 1 tablet (20 mg total) by mouth daily.  30 tablet  8   No current facility-administered medications for this visit.    History   Social History  . Marital Status: Married    Spouse Name: N/A    Number of Children: 5  . Years of  Education: N/A   Occupational History  . RETIRED     TRUCK DRIVER   Social History Main Topics  . Smoking status: Former Smoker -- 2.00 packs/day for 65 years    Types: Cigarettes  . Smokeless tobacco: Former Systems developer    Quit date: 10/29/2008  . Alcohol Use: No  . Drug Use: No  . Sexual Activity: Not on file   Other Topics Concern  . Not on file   Social History Narrative  . No narrative on file    History reviewed. No pertinent family history.  ROS General: Negative; No fevers, chills, or night sweats;  HEENT: Negative; No changes in vision or hearing, sinus congestion, difficulty swallowing Pulmonary: Negative; No cough, wheezing, shortness of breath, hemoptysis Cardiovascular: See history of present illness No chest pain, presyncope, syncope, palpitations Positive for claudication, right lower extremity greater than left. GI: Negative; No nausea, vomiting, diarrhea, or abdominal pain GU: Negative; No dysuria, hematuria, or difficulty voiding Musculoskeletal: Negative; no myalgias, joint pain, or weakness Hematologic/Oncology: Negative; no easy bruising, bleeding Endocrine: Negative; no heat/cold intolerance; no diabetes Neuro: Negative; no changes in balance, headaches Skin: Negative; No rashes or skin lesions Psychiatric: Negative; No behavioral problems, depression Sleep: Negative; No snoring, daytime sleepiness, hypersomnolence, bruxism, restless legs, hypnogognic hallucinations, no cataplexy Other comprehensive 14 point system review is negative.   PE BP 148/76   Pulse 61  Ht 5\' 8"  (1.727 m)  Wt 180 lb (81.647 kg)  BMI 27.38 kg/m2  General: Alert, oriented, no distress.  Skin: normal turgor, no rashes HEENT: Normocephalic, atraumatic. Pupils round and reactive; sclera anicteric;no lid lag.  Nose without nasal septal hypertrophy Mouth/Parynx benign; Mallinpatti scale 3 Neck: No JVD, no carotid bruits Lungs: clear to ausculatation and percussion; no wheezing or rales Heart: RRR, s1 s2 normal 1/6 systolic murmur. Abdomen: Mild central adiposity ;soft, nontender; no hepatosplenomehaly, BS+; abdominal aorta nontender and not dilated by palpation. Pulses 2+ upper extremity.  Bilateral femoral bruits.  Decreased pulses distally in the dorsalis pedis. Extremities: no clubbing cyanosis or edema, Homan's sign negative  Neurologic: grossly nonfocal Psychologic: normal affect and mood.  ECG  (Independently read by me): Ventricular paced rhythm.  LABS:  BMET    Component Value Date/Time   NA 137 07/02/2014 0905   NA 145 01/19/2014 0814   K 4.3 07/02/2014 0905   K 3.8 01/19/2014 0814   CL 103 07/02/2014 0905   CL 109* 01/20/2013 0842   CO2 26 07/02/2014 0905   CO2 26 01/19/2014 0814   GLUCOSE 91 07/02/2014 0905   GLUCOSE 98 01/19/2014 0814   GLUCOSE 114* 01/20/2013 0842   BUN 21 07/02/2014 0905   BUN 17.1 01/19/2014 0814   CREATININE 1.36* 07/02/2014 0905   CREATININE 1.2 01/19/2014 0814   CREATININE 1.29 01/15/2012 0930   CALCIUM 8.8 07/02/2014 0905   CALCIUM 9.4 01/19/2014 0814   GFRNONAA 54* 01/23/2014 1126   GFRNONAA 60* 06/01/2011 0420   GFRAA 62 01/23/2014 1126   GFRAA >60 06/01/2011 0420     Hepatic Function Panel     Component Value Date/Time   PROT 7.2 07/02/2014 0905   PROT 7.6 01/19/2014 0814   ALBUMIN 4.1 07/02/2014 0905   ALBUMIN 3.4* 01/19/2014 0814   AST 10 07/02/2014 0905   AST 11 01/19/2014 0814   ALT <8 07/02/2014 0905   ALT 8 01/19/2014 0814   ALKPHOS 80 07/02/2014 0905   ALKPHOS 92 01/19/2014 0814   BILITOT 0.7 07/02/2014 0905   BILITOT  0.51 01/19/2014 0814   BILIDIR <0.1 08/12/2008 0440   IBILI NOT CALCULATED 08/12/2008 0440     CBC    Component Value Date/Time   WBC 4.2 04/20/2014 0953   WBC 5.0 01/23/2014 1126   RBC 3.72* 04/20/2014 0953   RBC 3.83* 01/23/2014 1126   HGB 12.0* 04/20/2014 0953   HGB 11.9* 01/23/2014 1126   HCT 35.9* 04/20/2014 0953   HCT 34.9* 01/23/2014 1126   PLT 146 04/20/2014 0953   PLT 183 01/23/2014 1126   MCV 96.4 04/20/2014 0953   MCV 91.1 01/23/2014 1126   MCH 32.2 04/20/2014 0953   MCH 31.1 01/23/2014 1126   MCHC 33.4 04/20/2014 0953   MCHC 34.1 01/23/2014 1126   RDW 14.6 04/20/2014 0953   RDW 15.4 01/23/2014 1126   LYMPHSABS 1.0 04/20/2014 0953   LYMPHSABS 1.2 01/23/2014 1126   MONOABS 0.6 04/20/2014 0953   MONOABS 0.6 01/23/2014 1126   EOSABS 0.2 04/20/2014 0953   EOSABS 0.2 01/23/2014 1126   BASOSABS 0.0 04/20/2014 0953   BASOSABS 0.0 01/23/2014 1126     BNP    Component Value Date/Time   PROBNP 304.0* 11/12/2008 1154    Lipid Panel     Component Value Date/Time   CHOL 124 12/10/2011 0806   TRIG 90 12/10/2011 0806   HDL 46 12/10/2011 0806   CHOLHDL 2.7 12/10/2011 0806   VLDL 18 12/10/2011 0806   LDLCALC 60 12/10/2011 0806     RADIOLOGY: No results found.    ASSESSMENT AND PLAN: From a cardiac standpoint, John Peck continues to do fairly well now 26 years since his initial bypass surgery in 11 years since his stent was placed to his right coronary artery. He is on medical therapy for distal RCA disease as noted above and seems to be doing well without recurrent angina symptoms.  His most recent cardiac catheterization as outlined above, not significantly changed.  He has done well with his increased medical regimen is tolerating Ranexa 1000 twice a day in addition to high dose nitrate therapy.  Amlodipine 5 mg, carvedilol 25 mg twice a day.  I will discuss with him regarding his lotion A.  Doppler studies.  He is on aspirin and Plavix.  He is 80 years old.  I suspect the majority of his  claudication is due to his poor distal runoff, right greater than left.  At this point, I do not see any significant benefit in adding  Pletal, and with his age of 4 years I am hesistant to add zontivity to his aspirin and Plavix therapy.  I also reviewed his Doppler findings with Dr. Gwenlyn Found who agrees with medical management.  I'll see him in 6 months for reevaluation or sooner if problems arise.    Troy Sine, MD, [facility]  07/17/2014 6:38 PM

## 2014-07-17 NOTE — Patient Instructions (Signed)
Your physician wants you to follow-up in:  6 months. You will receive a reminder letter in the mail two months in advance. If you don't receive a letter, please call our office to schedule the follow-up appointment.   

## 2014-07-23 ENCOUNTER — Other Ambulatory Visit (HOSPITAL_BASED_OUTPATIENT_CLINIC_OR_DEPARTMENT_OTHER): Payer: Medicare Other

## 2014-07-23 ENCOUNTER — Telehealth: Payer: Self-pay | Admitting: Hematology

## 2014-07-23 ENCOUNTER — Ambulatory Visit (HOSPITAL_BASED_OUTPATIENT_CLINIC_OR_DEPARTMENT_OTHER): Payer: Medicare Other | Admitting: Hematology

## 2014-07-23 VITALS — BP 118/58 | HR 60 | Temp 98.6°F | Resp 17 | Ht 68.0 in | Wt 179.1 lb

## 2014-07-23 DIAGNOSIS — Z23 Encounter for immunization: Secondary | ICD-10-CM

## 2014-07-23 DIAGNOSIS — D509 Iron deficiency anemia, unspecified: Secondary | ICD-10-CM

## 2014-07-23 DIAGNOSIS — Z862 Personal history of diseases of the blood and blood-forming organs and certain disorders involving the immune mechanism: Secondary | ICD-10-CM

## 2014-07-23 LAB — CBC WITH DIFFERENTIAL/PLATELET
BASO%: 0.2 % (ref 0.0–2.0)
Basophils Absolute: 0 10*3/uL (ref 0.0–0.1)
EOS%: 3.1 % (ref 0.0–7.0)
Eosinophils Absolute: 0.1 10*3/uL (ref 0.0–0.5)
HCT: 33.5 % — ABNORMAL LOW (ref 38.4–49.9)
HEMOGLOBIN: 11.1 g/dL — AB (ref 13.0–17.1)
LYMPH#: 1.2 10*3/uL (ref 0.9–3.3)
LYMPH%: 27.1 % (ref 14.0–49.0)
MCH: 31.4 pg (ref 27.2–33.4)
MCHC: 33.1 g/dL (ref 32.0–36.0)
MCV: 94.6 fL (ref 79.3–98.0)
MONO#: 0.5 10*3/uL (ref 0.1–0.9)
MONO%: 11.3 % (ref 0.0–14.0)
NEUT#: 2.5 10*3/uL (ref 1.5–6.5)
NEUT%: 58.3 % (ref 39.0–75.0)
Platelets: 128 10*3/uL — ABNORMAL LOW (ref 140–400)
RBC: 3.54 10*6/uL — AB (ref 4.20–5.82)
RDW: 14 % (ref 11.0–14.6)
WBC: 4.3 10*3/uL (ref 4.0–10.3)

## 2014-07-23 LAB — FERRITIN CHCC: FERRITIN: 186 ng/mL (ref 22–316)

## 2014-07-23 LAB — IRON AND TIBC CHCC
%SAT: 32 % (ref 20–55)
IRON: 64 ug/dL (ref 42–163)
TIBC: 200 ug/dL — ABNORMAL LOW (ref 202–409)
UIBC: 136 ug/dL (ref 117–376)

## 2014-07-23 MED ORDER — INFLUENZA VAC SPLIT QUAD 0.5 ML IM SUSY
0.5000 mL | PREFILLED_SYRINGE | Freq: Once | INTRAMUSCULAR | Status: AC
  Administered 2014-07-23: 0.5 mL via INTRAMUSCULAR
  Filled 2014-07-23: qty 0.5

## 2014-07-23 NOTE — Telephone Encounter (Signed)
gv pt appt schedule for jan 2016 °

## 2014-07-23 NOTE — Progress Notes (Signed)
[facility] PROGRESS NOTE 07/23/2014  John Nettle, MD [address] [place] [address]  DIAGNOSIS:  Anemia and iron deficiency.   CURRENT TREATMENT: Ferrous Sulfate oral  INTERVAL HISTORY:  John Peck 80 y.o. male with a history of anemia is here for follow-up.  John Peck was last seen by Dr Juliann Mule on 01/19/2014. He denies any pagophagia. His overall condition has been generally stable, without any surgery or hospitalizations over the past year. He reports feeling pretty good. He is without chest pain. He has required no blood transfusions. He does complain of having numbness and tingling in his hands and feet although he is not a diabetic. He is compliant with oral iron. His labs from today showed that:        The iron results came later but patient was called and notified with results via phone.  MEDICAL HISTORY:  Past Medical History  Diagnosis Date  . Hypertension   . Pacemaker Sept 2009    St Jude  . CAD (coronary artery disease)     CABG '89, PCI '04  . Intermediate coronary syndrome   . Arrhythmia     afib, bradycardia  . Diastolic dysfunction   . Anemia   . Sick sinus syndrome Sept 2009    ST Jude PTVDP  . History of COPD   . H/O: GI bleed     REMOTE HISTORY  . Hyperlipidemia     INTERIM HISTORY: has HYPERTENSION; INTERMEDIATE CORONARY SYNDROME; S/P CABG x 2 '89. RCA stent'04. last cath Jan 2013; PACEMAKER, PERMANENT- St Jude Sept 2009; History of iron deficiency anemia; Sinoatrial node dysfunction; PAF (paroxysmal atrial fibrillation); Lower extremity edema; Unstable angina; and Atherosclerosis of lower extremity with claudication on his problem list.    ALLERGIES:  has No Known Allergies.  MEDICATIONS: has a current medication list which includes the following prescription(s): alfuzosin, amlodipine, aspirin, carvedilol, clopidogrel, ferrous sulfate, furosemide, isosorbide mononitrate, klor-con 10,  lubiprostone, menthol (topical analgesic), nitroglycerin, pantoprazole, ranolazine, and rosuvastatin.  SURGICAL HISTORY:  Past Surgical History  Procedure Laterality Date  . Coronary artery bypass graft  1989    had LIMA to his LAD, a vein to the circumflex. In 2004 underwent stenting to his right coronary artery.  . Cardiac catheterization  11/2011    EF 50%; significant native CAD w/80% stenosis of the LAD after 2nd giagonal vessel and septal perforating artery w/total occlusion of the mid left anterior descendiung.; 60-70% ostiasl stenosis in the circumflex vessel followed by 40% proximal stenosis and 70-80% distal circumflex stenosis;   . Cataract extraction  2004  . Prostatectomy    . Hemorrhoid surgery    . Insert / replace / remove pacemaker  07/17/08    DUAL-CHAMBER; PPM-ST.JUDE MEDNET  . Coronary angioplasty with stent placement  2004    RCA   PROBLEM LIST:  1. Iron deficiency anemia dating back to April 2007. The patient is currently on oral iron.Stools were Hemoccult positive x3 on 03/30/2011. He had an endoscopy and colonoscopy by Dr. Wilford Corner on 04/20/2011. There were no significant abnormalities.  2. Coronary artery disease status post CABG and status post coronary  artery stents.  3. Permanent dual chamber pacemaker placed in 1989.  4. Hypertension.  5. Dyslipidemia.  6. BPH status post TURP in 1992.  7. History of liver abscess.  8. COPD.  REVIEW OF SYSTEMS:   Constitutional: Denies fevers, chills or abnormal weight loss Eyes: Denies blurriness of vision Ears, nose, mouth,  throat, and face: Denies mucositis or sore throat Respiratory: Denies cough, dyspnea or wheezes Cardiovascular: Denies palpitation, chest discomfort or lower extremity swelling Gastrointestinal:  Denies nausea, heartburn or change in bowel habits Skin: Denies abnormal skin rashes Lymphatics: Denies new lymphadenopathy or easy bruising Neurological:admits to having numbness, tingling but no  new weaknesses Behavioral/Psych: Mood is stable, no new changes  All other systems were reviewed with the patient and are negative.  PHYSICAL EXAMINATION: ECOG PERFORMANCE STATUS: 0 - Asymptomatic  Blood pressure 118/58, pulse 60, temperature 98.6 F (37 C), temperature source Oral, resp. rate 17, height 5\' 8"  (1.727 m), weight 179 lb 1.6 oz (81.239 kg), SpO2 98.00%.  GENERAL:alert, no distress and comfortable;John Peck is an elderly gentleman, somewhat frail. His station and gait is unstable. He uses a cane.   SKIN: skin color, texture, turgor are normal, no rashes or significant lesions EYES: normal, Conjunctiva are pink and non-injected, sclera clear OROPHARYNX:no exudate, no erythema and lips, buccal mucosa, and tongue normal  NECK: supple, thyroid normal size, non-tender, without nodularity LYMPH:  no palpable lymphadenopathy in the cervical, axillary or supraclavicular LUNGS: clear to auscultation with normal breathing effort, no wheezes or rhonchi HEART: regular rate & rhythm and no murmurs and no lower extremity edema ABDOMEN:abdomen soft, non-tender and normal bowel sounds Musculoskeletal:no cyanosis of digits and no clubbing  NEURO: alert & oriented x 3 with fluent speech, no focal motor/sensory deficits  Labs:  Lab Results  Component Value Date   WBC 4.3 07/23/2014   HGB 11.1* 07/23/2014   HCT 33.5* 07/23/2014   MCV 94.6 07/23/2014   PLT 128* 07/23/2014   NEUTROABS 2.5 07/23/2014      Chemistry      Component Value Date/Time   NA 137 07/02/2014 0905   NA 145 01/19/2014 0814   K 4.3 07/02/2014 0905   K 3.8 01/19/2014 0814   CL 103 07/02/2014 0905   CL 109* 01/20/2013 0842   CO2 26 07/02/2014 0905   CO2 26 01/19/2014 0814   BUN 21 07/02/2014 0905   BUN 17.1 01/19/2014 0814   CREATININE 1.36* 07/02/2014 0905   CREATININE 1.2 01/19/2014 0814   CREATININE 1.29 01/15/2012 0930      Component Value Date/Time   CALCIUM 8.8 07/02/2014 0905   CALCIUM 9.4 01/19/2014 0814   ALKPHOS  80 07/02/2014 0905   ALKPHOS 92 01/19/2014 0814   AST 10 07/02/2014 0905   AST 11 01/19/2014 0814   ALT <8 07/02/2014 0905   ALT 8 01/19/2014 0814   BILITOT 0.7 07/02/2014 0905   BILITOT 0.51 01/19/2014 0814        RADIOGRAPHIC STUDIES:  DG Chest 2 view dated 01/19/2013  Clinical Data: Fall, right posterior chest and rib pain. CHEST - 2 VIEW Comparison: 03/23/2012 Findings: Left pacer remains in place, unchanged. Prior CABG. There is hyperinflation of the lungs compatible with COPD. Heart is normal size. Tortuosity of the thoracic aorta. No confluent airspace opacities or effusions. Stable mild interstitial prominence. No visible rib fracture or pneumothorax.  IMPRESSION: COPD/chronic changes. No active disease    ASSESSMENT: John Peck 80 y.o. male with a history of IDA (iron deficiency anemia). For his age we also expect to see some physiological anemia but hemoglobin 11.1 is really good for him. He has adequate iron stores as per labs today.   PLAN:   1. Iron deficiency anemia.  --Mr. Wurth's hemoglobin and hematocrit and previous iron studies remain stable and satisfactory. I have recommended that Mr.  Hegel stay on ferrous sulfate 3 times daily.  --We will plan to check a CBC, iron studies and ferritin in 3-4 months and follow him in clinic.  -- I also encouraged him to take a Vitamin C 500 mg daily and Vitamin b12 which can help him with neuropathy and memory. -- He did get a flu shot in office today.   All questions were answered. The patient knows to call the clinic with any problems, questions or concerns. We can certainly see the patient much sooner if necessary.  I spent 15 minutes counseling the patient face to face. The total time spent in the appointment was 20 minutes.    Bernadene Bell, MD Medical Hematologist/Oncologist [place] Pager: [phone] Office No: [phone]

## 2014-09-04 ENCOUNTER — Ambulatory Visit (INDEPENDENT_AMBULATORY_CARE_PROVIDER_SITE_OTHER): Payer: Medicare Other | Admitting: Cardiovascular Disease

## 2014-09-04 ENCOUNTER — Encounter: Payer: Self-pay | Admitting: Cardiovascular Disease

## 2014-09-04 VITALS — BP 112/62 | HR 60 | Ht 69.0 in | Wt 178.8 lb

## 2014-09-04 DIAGNOSIS — I10 Essential (primary) hypertension: Secondary | ICD-10-CM

## 2014-09-04 DIAGNOSIS — R600 Localized edema: Secondary | ICD-10-CM

## 2014-09-04 DIAGNOSIS — I70219 Atherosclerosis of native arteries of extremities with intermittent claudication, unspecified extremity: Secondary | ICD-10-CM

## 2014-09-04 DIAGNOSIS — I495 Sick sinus syndrome: Secondary | ICD-10-CM

## 2014-09-04 DIAGNOSIS — Z950 Presence of cardiac pacemaker: Secondary | ICD-10-CM

## 2014-09-04 DIAGNOSIS — Z862 Personal history of diseases of the blood and blood-forming organs and certain disorders involving the immune mechanism: Secondary | ICD-10-CM

## 2014-09-04 NOTE — Patient Instructions (Signed)
Your physician recommends that you schedule a follow-up appointment in: 6 Months  

## 2014-09-04 NOTE — Progress Notes (Signed)
Patient ID: John Peck, male   DOB: 09-Aug-1924, 80 y.o.   MRN: [id number]     HPI: John Peck is a 80 y.o. male who presents to the office today for follow-up cardiology evaluation.  John Peck has known coronary artery disease and in 1989 underwent CABG surgery with LIMA to the LAD, a vein to the circumflex. In 2004 he underwent stenting to his RCA. Cardiac catheterization in January 2013 showed an ejection fraction of 50% with mild inferior, distal inferior and apical lateral hypocontractility. He had significant native CAD with 80% stenosis of the LAD after the second diagonal vessel and septal perforator artery with total occlusion of the mid LAD. He was 30 - 70% ostial stenosis in the circumflex vessel followed by 40% proximal stenosis and 7080% distal stenosis. He had an occluded Y graft which previously sequentially supplied the intermediate and distal circumflex.There was a patent stented proximal right coronary artery with 90% stenosis in the anterior RV marginal branch arising from the mid RCA and had 70-80% stenosis in the distal RCA beyond the crux with 78% stenosis the PDA takeoff. His LIMA to LAD was patent. He has been on medical therapy. He has a history of atrial fibrillation, sick sinus syndrome and status post permanent pacemaker in 2009 which is a St. Jude device. Additional problems include iron deficiency anemia, hypertension, BPH, status post TURP in 1992, remote history of liver abscess, history of COPD.  In March 2015 due to recurrent increasing symptoms of chest pain.  He underwent repeat cardiac catheterization.  He was noted to have mild LV dysfunction with an EF of 45% inferior wall hypocontractility. There was significant multivessel native coronary obstructive disease with 50% ostial LAD stenosis followed by 80% proximal stenosis in the region of the first diagonal takeoff followed by 80% stenosis after the second diagonal vessel; total occlusion of the ramus  intermediate vessel with evidence for bidirectional retrograde collateralization; 40% ostial left circumflex stenosis; and widely patent proximal RCA stent with 80% stenosis in the anterior RV marginal branch, 40% mid or CVA stenosis, 50% distal, and 50% ostial and mid PDA stenoses. He had a atent LIMA graft supplying the mid LAD and an occluded Y graft which previously supplied the intermediate and distal circumflex.  He has been on increased medical regimen consisting of aspirin, Plavix, carvedilol 25 mg twice a day, high dose oral nitrate therapy, Ranexa 1000 g twice a day.  Amlodipine 10 mg in addition to Crestor 20 mg.  He also is taking Lasix 20 mg with supplemental potassium.  He was seen in the office by John Peck and admitted to bilateral lower extremity pain and weakness.  His blood pressure was somewhat low and his amlodipine dose was reduced from 10-5 mg.  He underwent lower Schembri Doppler studies.  I reviewed these with him in detail today.  These show an ABI of 0.5 on the right and 0.58 on the left.  He had mild dilatation of his distal abdominal aorta.  There was equal to less than 50% diameter reduction.  The right SFA and right popliteal artery and there was a greater than 60% diameter reduction in the left SFA.  However, there was one vessel runoff via the peroneal artery bilaterally.  His symptoms are worse in the right lower extremity due to his distal disease.  I saw him 6 weeks ago at which time he had done well with his increased medical regimen and was tolerating Ranexa.  In addition,  high-dose nitrate therapy, amlodipine and carvedilol.  He underwent a follow-up Doppler study in August 2015.  Teen, which showed ABI of 0.5 on the right and 0.58 on the left.  He has poor distal runoff.  His Doppler studies were reviewed with John Peck who agreed with medical therapy.  He already was on aspirin and Plavix  Over the past weeks weeks, he admits to being stable.  His blood pressure  has been controlled.  He denies any worsening claudication.  Past Medical History  Diagnosis Date  . Hypertension   . Pacemaker Sept 2009    St Jude  . CAD (coronary artery disease)     CABG '89, PCI '04  . Intermediate coronary syndrome   . Arrhythmia     afib, bradycardia  . Diastolic dysfunction   . Anemia   . Sick sinus syndrome Sept 2009    ST Jude PTVDP  . History of COPD   . H/O: GI bleed     REMOTE HISTORY  . Hyperlipidemia     Past Surgical History  Procedure Laterality Date  . Coronary artery bypass graft  1989    had LIMA to his LAD, a vein to the circumflex. In 2004 underwent stenting to his right coronary artery.  . Cardiac catheterization  11/2011    EF 50%; significant native CAD w/80% stenosis of the LAD after 2nd giagonal vessel and septal perforating artery w/total occlusion of the mid left anterior descendiung.; 60-70% ostiasl stenosis in the circumflex vessel followed by 40% proximal stenosis and 70-80% distal circumflex stenosis;   . Cataract extraction  2004  . Prostatectomy    . Hemorrhoid surgery    . Insert / replace / remove pacemaker  07/17/08    DUAL-CHAMBER; PPM-ST.JUDE MEDNET  . Coronary angioplasty with stent placement  2004    RCA    No Known Allergies  Current Outpatient Prescriptions  Medication Sig Dispense Refill  . alfuzosin (UROXATRAL) 10 MG 24 hr tablet Take 10 mg by mouth daily.      Marland Kitchen amLODipine (NORVASC) 10 MG tablet Take 5 mg by mouth daily.      Marland Kitchen aspirin 81 MG tablet Take 81 mg by mouth daily.       . carvedilol (COREG) 25 MG tablet Take 25 mg by mouth 2 (two) times daily with a meal.        . clopidogrel (PLAVIX) 75 MG tablet Take 75 mg by mouth daily.        . ferrous sulfate 325 (65 FE) MG tablet Take 325 mg by mouth 3 (three) times daily with meals.       . furosemide (LASIX) 20 MG tablet Take 20 mg by mouth daily.        . isosorbide mononitrate (IMDUR) 60 MG 24 hr tablet Take 1 tablet (60 mg total) by mouth daily. Take 2  tablets (120 mg total) by mouth every morning and 1 tablet (60 mg total) by mouth in the evening.  270 tablet  2  . KLOR-CON 10 10 MEQ tablet Take 10 mEq by mouth daily.       Marland Kitchen lubiprostone (AMITIZA) 24 MCG capsule Take 24 mcg by mouth daily as needed. For bowel movement      . Menthol, Topical Analgesic, (ICY HOT EX) Apply 1 application topically daily as needed (aches and pains).      . nitroGLYCERIN (NITROSTAT) 0.4 MG SL tablet Place 1 tablet (0.4 mg total) under the tongue as needed.  25 tablet  6  . pantoprazole (PROTONIX) 40 MG tablet Take 40 mg by mouth 2 (two) times daily.      . ranolazine (RANEXA) 1000 MG SR tablet Take 1 tablet (1,000 mg total) by mouth 2 (two) times daily.  180 tablet  3  . rosuvastatin (CRESTOR) 20 MG tablet Take 1 tablet (20 mg total) by mouth daily.  30 tablet  8   No current facility-administered medications for this visit.    History   Social History  . Marital Status: Married    Spouse Name: N/A    Number of Children: 21  . Years of Education: N/A   Occupational History  . RETIRED     TRUCK DRIVER   Social History Main Topics  . Smoking status: Former Smoker -- 2.00 packs/day for 65 years    Types: Cigarettes  . Smokeless tobacco: Former Systems developer    Quit date: 10/29/2008  . Alcohol Use: No  . Drug Use: No  . Sexual Activity: Not on file   Other Topics Concern  . Not on file   Social History Narrative  . No narrative on file    History reviewed. No pertinent family history.  ROS General: Negative; No fevers, chills, or night sweats;  HEENT: Negative; No changes in vision or hearing, sinus congestion, difficulty swallowing Pulmonary: Negative; No cough, wheezing, shortness of breath, hemoptysis Cardiovascular: See history of present illness No chest pain, presyncope, syncope, palpitations Positive for claudication, right lower extremity greater than left. GI: Negative; No nausea, vomiting, diarrhea, or abdominal pain GU: Negative; No  dysuria, hematuria, or difficulty voiding Musculoskeletal: Negative; no myalgias, joint pain, or weakness Hematologic/Oncology: Negative; no easy bruising, bleeding Endocrine: Negative; no heat/cold intolerance; no diabetes Neuro: Negative; no changes in balance, headaches Skin: Negative; No rashes or skin lesions Psychiatric: Negative; No behavioral problems, depression Sleep: Negative; No snoring, daytime sleepiness, hypersomnolence, bruxism, restless legs, hypnogognic hallucinations, no cataplexy Other comprehensive 14 point system review is negative.   PE BP 112/62  Pulse 60  Ht 5\' 9"  (1.753 m)  Wt 178 lb 12.8 oz (81.103 kg)  BMI 26.39 kg/m2  General: Alert, oriented, no distress.  Skin: normal turgor, no rashes HEENT: Normocephalic, atraumatic. Pupils round and reactive; sclera anicteric;no lid lag.  Nose without nasal septal hypertrophy Mouth/Parynx benign; Mallinpatti scale 3 Neck: No JVD, no carotid bruits; normal carotid upstroke Lungs: clear to ausculatation and percussion; no wheezing or rales Chest wall: Nontender to palpation Heart: RRR, s1 s2 normal 1/6 systolic murmur; no diastolic murmur.  No S3 gallop.  No rubs, thrills or heaves.. Abdomen: Mild central adiposity ;soft, nontender; no hepatosplenomehaly, BS+; abdominal aorta nontender and not dilated by palpation. Back: No CVA tenderness Pulses 2+ upper extremity.  Bilateral femoral bruits.  Decreased pulses distally in the dorsalis pedis. Extremities: no clubbing cyanosis or edema, Homan's sign negative  Neurologic: grossly nonfocal Psychologic: normal affect and mood.  ECG  (Independently read by me): Ventricular paced rhythm.  Unchanged from prior ECG.  LABS:  BMET    Component Value Date/Time   NA 137 07/02/2014 0905   NA 145 01/19/2014 0814   K 4.3 07/02/2014 0905   K 3.8 01/19/2014 0814   CL 103 07/02/2014 0905   CL 109* 01/20/2013 0842   CO2 26 07/02/2014 0905   CO2 26 01/19/2014 0814   GLUCOSE 91  07/02/2014 0905   GLUCOSE 98 01/19/2014 0814   GLUCOSE 114* 01/20/2013 0842   BUN 21 07/02/2014 0905  BUN 17.1 01/19/2014 0814   CREATININE 1.36* 07/02/2014 0905   CREATININE 1.2 01/19/2014 0814   CREATININE 1.29 01/15/2012 0930   CALCIUM 8.8 07/02/2014 0905   CALCIUM 9.4 01/19/2014 0814   GFRNONAA 54* 01/23/2014 1126   GFRNONAA 60* 06/01/2011 0420   GFRAA 62 01/23/2014 1126   GFRAA >60 06/01/2011 0420     Hepatic Function Panel     Component Value Date/Time   PROT 7.2 07/02/2014 0905   PROT 7.6 01/19/2014 0814   ALBUMIN 4.1 07/02/2014 0905   ALBUMIN 3.4* 01/19/2014 0814   AST 10 07/02/2014 0905   AST 11 01/19/2014 0814   ALT <8 07/02/2014 0905   ALT 8 01/19/2014 0814   ALKPHOS 80 07/02/2014 0905   ALKPHOS 92 01/19/2014 0814   BILITOT 0.7 07/02/2014 0905   BILITOT 0.51 01/19/2014 0814   BILIDIR <0.1 08/12/2008 0440   IBILI NOT CALCULATED 08/12/2008 0440     CBC    Component Value Date/Time   WBC 4.3 07/23/2014 1003   WBC 5.0 01/23/2014 1126   RBC 3.54* 07/23/2014 1003   RBC 3.83* 01/23/2014 1126   HGB 11.1* 07/23/2014 1003   HGB 11.9* 01/23/2014 1126   HCT 33.5* 07/23/2014 1003   HCT 34.9* 01/23/2014 1126   PLT 128* 07/23/2014 1003   PLT 183 01/23/2014 1126   MCV 94.6 07/23/2014 1003   MCV 91.1 01/23/2014 1126   MCH 31.4 07/23/2014 1003   MCH 31.1 01/23/2014 1126   MCHC 33.1 07/23/2014 1003   MCHC 34.1 01/23/2014 1126   RDW 14.0 07/23/2014 1003   RDW 15.4 01/23/2014 1126   LYMPHSABS 1.2 07/23/2014 1003   LYMPHSABS 1.2 01/23/2014 1126   MONOABS 0.5 07/23/2014 1003   MONOABS 0.6 01/23/2014 1126   EOSABS 0.1 07/23/2014 1003   EOSABS 0.2 01/23/2014 1126   BASOSABS 0.0 07/23/2014 1003   BASOSABS 0.0 01/23/2014 1126     BNP    Component Value Date/Time   PROBNP 304.0* 11/12/2008 1154    Lipid Panel     Component Value Date/Time   CHOL 124 12/10/2011 0806   TRIG 90 12/10/2011 0806   HDL 46 12/10/2011 0806   CHOLHDL 2.7 12/10/2011 0806   VLDL 18 12/10/2011 0806   LDLCALC 60 12/10/2011 0806      RADIOLOGY: No results Peck.    ASSESSMENT AND PLAN: Mr. Whetsell is an 80 year old gentleman who continues to do fairly well 26 years since his initial bypass surgery in 11 years since his stent was placed to his RCA. He is on medical therapy for distal RCA disease as noted above and seems to be doing well without recurrent angina symptoms.  His most recent cardiac catheterization is not significantly changed.  He has done well with his increased medical regimen is tolerating Ranexa 1000 twice a day in addition to high dose nitrate therapy, Amlodipine 5 mg, carvedilol 25 mg twice a day.  His blood pressure today is stable at 112/62.  His edema has resolved.  He has very poor distal runoff on his Doppler studies.  I reviewed his blood work from September which his hemoglobin was 11.1, hematocrit 33.5.  His iron studies have improved with his percent saturation,now at 32%  up from 21% one year ago.  He is 100% paced with reference to his cardiac rhythm.  He will continue current therapy.  I will see him in 6 months for reevaluation or sooner if problems arise.   Troy Sine, MD, [facility]  09/04/2014 11:01  AM    

## 2014-09-07 ENCOUNTER — Emergency Department (HOSPITAL_COMMUNITY)
Admission: EM | Admit: 2014-09-07 | Discharge: 2014-09-07 | Disposition: A | Discharge status: Home / Self Care | Payer: Medicare Other | Source: Home / Self Care | Attending: Emergency Medicine | Admitting: Emergency Medicine

## 2014-09-07 ENCOUNTER — Encounter (HOSPITAL_COMMUNITY): Payer: Self-pay | Admitting: Emergency Medicine

## 2014-09-07 DIAGNOSIS — K047 Periapical abscess without sinus: Secondary | ICD-10-CM

## 2014-09-07 MED ORDER — PENICILLIN V POTASSIUM 500 MG PO TABS: 500.0000 mg | ORAL_TABLET | Freq: Four times a day (QID) | ORAL | Status: AC

## 2014-09-07 MED ORDER — HYDROCODONE-ACETAMINOPHEN 5-325 MG PO TABS
1.0000 | ORAL_TABLET | ORAL | Status: DC | PRN
Start: 2014-09-07 — End: 2014-12-18

## 2014-09-07 NOTE — ED Notes (Addendum)
C/o R lower jaw toothache x 2 weeks.  States his tooth is loose and is swollen around it.  Has appointment with his dentist on Mon.

## 2014-09-07 NOTE — Discharge Instructions (Signed)
Follow up with the dentist on Monday.  If the dentist feels you need to stop the plavix, you will need to discuss it with your heart doctor. Finish all of the antibiotics, even if you are feeling better.    Abscessed Tooth An abscessed tooth is an infection around your tooth. It may be caused by holes or damage to the tooth (cavity) or a dental disease. An abscessed tooth causes mild to very bad pain in and around the tooth. See your dentist right away if you have tooth or gum pain. HOME CARE  Take your medicine as told. Finish it even if you start to feel better.  Do not drive after taking pain medicine.  Rinse your mouth (gargle) often with salt water ( teaspoon salt in 8 ounces of warm water).  Do not apply heat to the outside of your face. GET HELP RIGHT AWAY IF:   You have a temperature by mouth above 102 F (38.9 C), not controlled by medicine.  You have chills and a very bad headache.  You have problems breathing or swallowing.  Your mouth will not open.  You develop puffiness (swelling) on the neck or around the eye.  Your pain is not helped by medicine.  Your pain is getting worse instead of better. MAKE SURE YOU:   Understand these instructions.  Will watch your condition.  Will get help right away if you are not doing well or get worse. Document Released: 04/13/2008 Document Revised: 01/18/2012 Document Reviewed: 02/03/2011 [facility] Patient Information 2015 [place], [place]. This information is not intended to replace advice given to you by your health care provider. Make sure you discuss any questions you have with your health care provider.

## 2014-09-07 NOTE — ED Provider Notes (Signed)
Medical screening examination/treatment/procedure(s) were performed by resident physician or non-physician practitioner and as supervising physician I was immediately available for consultation/collaboration.  Maryruth Eve, MD     Melony Overly, MD 09/07/14 2136

## 2014-09-07 NOTE — ED Provider Notes (Signed)
CSN: [id number]     Arrival date & time 09/07/14  1921 History   First MD Initiated Contact with Patient 09/07/14 2043     No chief complaint on file.  (Consider location/radiation/quality/duration/timing/severity/associated sxs/prior Treatment) Patient is a 80 y.o. male presenting with tooth pain. The history is provided by the patient.  Dental Pain Location:  Lower Lower teeth location:  28/RL 1st bicuspid Quality:  Aching Severity:  Severe Onset quality:  Gradual Duration:  2 weeks Timing:  Constant Progression:  Worsening Chronicity:  New Relieved by:  Nothing Worsened by:  Nothing tried Ineffective treatments:  None tried Associated symptoms: facial pain and gum swelling   Associated symptoms: no fever   Risk factors: periodontal disease     Past Medical History  Diagnosis Date  . Hypertension   . Pacemaker Sept 2009    St Jude  . CAD (coronary artery disease)     CABG '89, PCI '04  . Intermediate coronary syndrome   . Arrhythmia     afib, bradycardia  . Diastolic dysfunction   . Anemia   . Sick sinus syndrome Sept 2009    ST Jude PTVDP  . History of COPD   . H/O: GI bleed     REMOTE HISTORY  . Hyperlipidemia    Past Surgical History  Procedure Laterality Date  . Coronary artery bypass graft  1989    had LIMA to his LAD, a vein to the circumflex. In 2004 underwent stenting to his right coronary artery.  . Cardiac catheterization  11/2011    EF 50%; significant native CAD w/80% stenosis of the LAD after 2nd giagonal vessel and septal perforating artery w/total occlusion of the mid left anterior descendiung.; 60-70% ostiasl stenosis in the circumflex vessel followed by 40% proximal stenosis and 70-80% distal circumflex stenosis;   . Cataract extraction  2004  . Prostatectomy    . Hemorrhoid surgery    . Insert / replace / remove pacemaker  07/17/08    DUAL-CHAMBER; PPM-ST.JUDE MEDNET  . Coronary angioplasty with stent placement  2004    RCA   History  reviewed. No pertinent family history. History  Substance Use Topics  . Smoking status: Former Smoker -- 2.00 packs/day for 65 years    Types: Cigarettes  . Smokeless tobacco: Former Systems developer    Quit date: 10/29/2008  . Alcohol Use: No    Review of Systems  Constitutional: Negative for fever and chills.  HENT: Positive for dental problem.     Allergies  Review of patient's allergies indicates no known allergies.  Home Medications   Prior to Admission medications   Medication Sig Start Date End Date Taking? Authorizing Provider  alfuzosin (UROXATRAL) 10 MG 24 hr tablet Take 10 mg by mouth daily.   Yes Historical Provider, MD  amLODipine (NORVASC) 10 MG tablet Take 5 mg by mouth daily. 09/15/13  Yes Troy Sine, MD  aspirin 81 MG tablet Take 81 mg by mouth daily.    Yes Historical Provider, MD  carvedilol (COREG) 25 MG tablet Take 25 mg by mouth 2 (two) times daily with a meal.     Yes Historical Provider, MD  clopidogrel (PLAVIX) 75 MG tablet Take 75 mg by mouth daily.     Yes Historical Provider, MD  ferrous sulfate 325 (65 FE) MG tablet Take 325 mg by mouth 3 (three) times daily with meals.    Yes Historical Provider, MD  furosemide (LASIX) 20 MG tablet Take 20 mg by mouth daily.  Yes Historical Provider, MD  isosorbide mononitrate (IMDUR) 60 MG 24 hr tablet Take 1 tablet (60 mg total) by mouth daily. Take 2 tablets (120 mg total) by mouth every morning and 1 tablet (60 mg total) by mouth in the evening. 07/05/14  Yes Troy Sine, MD  KLOR-CON 10 10 MEQ tablet Take 10 mEq by mouth daily.  07/29/12  Yes Historical Provider, MD  lubiprostone (AMITIZA) 24 MCG capsule Take 24 mcg by mouth daily as needed. For bowel movement   Yes Historical Provider, MD  Menthol, Topical Analgesic, (ICY HOT EX) Apply 1 application topically daily as needed (aches and pains).   Yes Historical Provider, MD  pantoprazole (PROTONIX) 40 MG tablet Take 40 mg by mouth 2 (two) times daily.   Yes Historical  Provider, MD  ranolazine (RANEXA) 1000 MG SR tablet Take 1 tablet (1,000 mg total) by mouth 2 (two) times daily. 04/04/14  Yes Troy Sine, MD  rosuvastatin (CRESTOR) 20 MG tablet Take 1 tablet (20 mg total) by mouth daily. 05/23/14  Yes Troy Sine, MD  HYDROcodone-acetaminophen (NORCO/VICODIN) 5-325 MG per tablet Take 1 tablet by mouth every 4 (four) hours as needed. 09/07/14   Carvel Getting, NP  nitroGLYCERIN (NITROSTAT) 0.4 MG SL tablet Place 1 tablet (0.4 mg total) under the tongue as needed. 01/19/14   Troy Sine, MD  penicillin v potassium (VEETID) 500 MG tablet Take 1 tablet (500 mg total) by mouth 4 (four) times daily. 09/07/14 09/14/14  Carvel Getting, NP   BP 151/75  Pulse 68  Temp(Src) 97.9 F (36.6 C) (Oral)  Resp 16  SpO2 97% Physical Exam  Constitutional: He appears well-developed and well-nourished. No distress.  HENT:  Mouth/Throat:    Overall teeth in very poor repair  Lymphadenopathy:       Head (right side): No submental, no submandibular and no tonsillar adenopathy present.       Head (left side): No submental, no submandibular and no tonsillar adenopathy present.    ED Course  Procedures (including critical care time) Labs Review Labs Reviewed - No data to display  Imaging Review No results found.   MDM   1. Dental abscess   rx pcn 500mg  QID #40. Rx hydrocodone/apap 5/325 1 q4hrs prn pain #10.      Carvel Getting, NP 09/07/14 2058

## 2014-09-08 ENCOUNTER — Encounter: Payer: Self-pay | Admitting: Cardiovascular Disease

## 2014-09-24 ENCOUNTER — Other Ambulatory Visit: Payer: Self-pay | Admitting: Cardiovascular Disease

## 2014-09-24 NOTE — Telephone Encounter (Signed)
Rx refill sent to patient pharmacy   

## 2014-10-02 ENCOUNTER — Ambulatory Visit (INDEPENDENT_AMBULATORY_CARE_PROVIDER_SITE_OTHER): Payer: Medicare Other | Admitting: Internal Medicine

## 2014-10-02 ENCOUNTER — Encounter: Payer: Self-pay | Admitting: Internal Medicine

## 2014-10-02 VITALS — BP 102/50 | HR 65 | Ht 69.0 in | Wt 176.4 lb

## 2014-10-02 DIAGNOSIS — I495 Sick sinus syndrome: Secondary | ICD-10-CM

## 2014-10-02 DIAGNOSIS — I480 Paroxysmal atrial fibrillation: Secondary | ICD-10-CM

## 2014-10-02 DIAGNOSIS — I10 Essential (primary) hypertension: Secondary | ICD-10-CM

## 2014-10-02 DIAGNOSIS — Z950 Presence of cardiac pacemaker: Secondary | ICD-10-CM

## 2014-10-02 LAB — MDC_IDC_ENUM_SESS_TYPE_INCLINIC
Brady Statistic RA Percent Paced: 93 %
Brady Statistic RV Percent Paced: 98 %
Date Time Interrogation Session: 20151124103127
Implantable Pulse Generator Model: 5826
Implantable Pulse Generator Serial Number: 1266473
Lead Channel Pacing Threshold Amplitude: 0.5 V
Lead Channel Pacing Threshold Amplitude: 1 V
Lead Channel Pacing Threshold Pulse Width: 0.4 ms
Lead Channel Sensing Intrinsic Amplitude: 4.7 mV
Lead Channel Sensing Intrinsic Amplitude: 5.1 mV
Lead Channel Setting Pacing Amplitude: 2 V
Lead Channel Setting Sensing Sensitivity: 2 mV
MDC IDC MSMT BATTERY IMPEDANCE: 1700 Ohm
MDC IDC MSMT BATTERY VOLTAGE: 2.76 V
MDC IDC MSMT LEADCHNL RA IMPEDANCE VALUE: 370 Ohm
MDC IDC MSMT LEADCHNL RA PACING THRESHOLD PULSEWIDTH: 0.4 ms
MDC IDC MSMT LEADCHNL RV IMPEDANCE VALUE: 402 Ohm
MDC IDC SET LEADCHNL RV PACING PULSEWIDTH: 0.4 ms

## 2014-10-02 NOTE — Assessment & Plan Note (Signed)
His blood pressure is on the low side. He will stop amlodipine. I have asked him to check in with Dr. Montez Morita.

## 2014-10-02 NOTE — Patient Instructions (Signed)
Your physician wants you to follow-up in: 6 months in the device clinic and 12 months with Dr. Knox Saliva will receive a reminder letter in the mail two months in advance. If you don't receive a letter, please call our office to schedule the follow-up appointment.  Your physician has recommended you make the following change in your medication:  1) Stop Amlodipine

## 2014-10-02 NOTE — Assessment & Plan Note (Signed)
He is maintaining NSR very nicely. Will continue his current meds

## 2014-10-02 NOTE — Assessment & Plan Note (Signed)
His [employer]. Jude DDD PM is working normally. Will recheck in several months. 

## 2014-10-02 NOTE — Progress Notes (Signed)
HPI [name] returns today for followup. He is a very pleasant 80 year old man with a history of symptomatic bradycardia, status post permanent pacemaker insertion. He also is hypertension. He has done well in the interim. His wife notes that he is relatively inactive. He denies syncope or near-syncope. No peripheral edema. He has had trouble with sleeping. He sleeps during the day and then is up at night. He has had some weakness. No Known Allergies   Current Outpatient Prescriptions  Medication Sig Dispense Refill  . alfuzosin (UROXATRAL) 10 MG 24 hr tablet Take 10 mg by mouth daily.    Marland Kitchen aspirin 81 MG tablet Take 81 mg by mouth daily.     . carvedilol (COREG) 25 MG tablet Take 25 mg by mouth 2 (two) times daily with a meal.      . clopidogrel (PLAVIX) 75 MG tablet Take 75 mg by mouth daily.      . ferrous sulfate 325 (65 FE) MG tablet Take 325 mg by mouth 3 (three) times daily with meals.     . furosemide (LASIX) 20 MG tablet Take 20 mg by mouth daily.      Marland Kitchen HYDROcodone-acetaminophen (NORCO/VICODIN) 5-325 MG per tablet Take 1 tablet by mouth every 4 (four) hours as needed. (Patient taking differently: Take 1 tablet by mouth every 4 (four) hours as needed (pain). ) 10 tablet 0  . isosorbide mononitrate (IMDUR) 60 MG 24 hr tablet Take 1 tablet (60 mg total) by mouth daily. Take 2 tablets (120 mg total) by mouth every morning and 1 tablet (60 mg total) by mouth in the evening. (Patient taking differently: Take 2 tablets (120 mg total) by mouth every morning and 1 tablet (60 mg total) by mouth in the evening.) 270 tablet 2  . lubiprostone (AMITIZA) 24 MCG capsule Take 24 mcg by mouth daily as needed. For bowel movement    . Menthol, Topical Analgesic, (ICY HOT EX) Apply 1 application topically daily as needed (aches and pains).    . nitroGLYCERIN (NITROSTAT) 0.4 MG SL tablet Place 1 tablet (0.4 mg total) under the tongue as needed. (Patient taking differently: Place 0.4 mg under the tongue every  5 (five) minutes as needed for chest pain (MAX 3 TABLETS). ) 25 tablet 6  . pantoprazole (PROTONIX) 40 MG tablet Take 40 mg by mouth 2 (two) times daily.    . ranolazine (RANEXA) 1000 MG SR tablet Take 1 tablet (1,000 mg total) by mouth 2 (two) times daily. 180 tablet 3  . rosuvastatin (CRESTOR) 20 MG tablet Take 1 tablet (20 mg total) by mouth daily. 30 tablet 8  . potassium citrate (UROCIT-K) 10 MEQ (1080 MG) SR tablet Take 10 mEq by mouth every morning.  12   No current facility-administered medications for this visit.     Past Medical History  Diagnosis Date  . Hypertension   . Pacemaker Sept 2009    St Jude  . CAD (coronary artery disease)     CABG '89, PCI '04  . Intermediate coronary syndrome   . Arrhythmia     afib, bradycardia  . Diastolic dysfunction   . Anemia   . Sick sinus syndrome Sept 2009    ST Jude PTVDP  . History of COPD   . H/O: GI bleed     REMOTE HISTORY  . Hyperlipidemia     ROS:   All systems reviewed and negative except as noted in the HPI.   Past Surgical History  Procedure Laterality Date  .  Coronary artery bypass graft  1989    had LIMA to his LAD, a vein to the circumflex. In 2004 underwent stenting to his right coronary artery.  . Cardiac catheterization  11/2011    EF 50%; significant native CAD w/80% stenosis of the LAD after 2nd giagonal vessel and septal perforating artery w/total occlusion of the mid left anterior descendiung.; 60-70% ostiasl stenosis in the circumflex vessel followed by 40% proximal stenosis and 70-80% distal circumflex stenosis;   . Cataract extraction  2004  . Prostatectomy    . Hemorrhoid surgery    . Insert / replace / remove pacemaker  07/17/08    DUAL-CHAMBER; PPM-ST.JUDE MEDNET  . Coronary angioplasty with stent placement  2004    RCA     History reviewed. No pertinent family history.   History   Social History  . Marital Status: Married    Spouse Name: N/A    Number of Children: 78  . Years of  Education: N/A   Occupational History  . RETIRED     TRUCK DRIVER   Social History Main Topics  . Smoking status: Former Smoker -- 2.00 packs/day for 65 years    Types: Cigarettes  . Smokeless tobacco: Former Systems developer    Quit date: 10/29/2008  . Alcohol Use: No  . Drug Use: No  . Sexual Activity: Not on file   Other Topics Concern  . Not on file   Social History Narrative     BP 102/50 mmHg  Pulse 65  Ht 5\' 9"  (1.753 m)  Wt 176 lb 6.4 oz (80.015 kg)  BMI 26.04 kg/m2  Physical Exam:  Well appearing 80 year old man, NAD HEENT: Unremarkable Neck:  7 cm JVD, no thyromegally Lungs:  Clear with no wheezes, rales, or rhonchi. HEART:  Regular rate rhythm, no murmurs, no rubs, no clicks Abd:  soft, positive bowel sounds, no organomegally, no rebound, no guarding Ext:  2 plus pulses, no edema, no cyanosis, no clubbing Skin:  No rashes no nodules Neuro:  CN II through XII intact, motor grossly intact  DEVICE  Normal device function.  See PaceArt for details.   Assess/Plan:

## 2014-10-09 ENCOUNTER — Telehealth: Payer: Self-pay | Admitting: Cardiovascular Disease

## 2014-10-09 MED ORDER — RANOLAZINE ER 1000 MG PO TB12: 1000.0000 mg | ORAL_TABLET | Freq: Two times a day (BID) | ORAL | Status: DC

## 2014-10-09 NOTE — Telephone Encounter (Signed)
Wife was calling in to see if there were any samples of Ranexa . Please call  Thanks

## 2014-10-09 NOTE — Telephone Encounter (Signed)
SPOKE TO PATIENT INFORMED PATIENT ,2 WEEKS OF SAMPLES AVAILABLE. HE THANKED THE RN , WILL LET HIS WIFE KNOW.

## 2014-10-15 ENCOUNTER — Other Ambulatory Visit: Payer: Self-pay | Admitting: Cardiovascular Disease

## 2014-10-15 MED ORDER — RANOLAZINE ER 1000 MG PO TB12: 1000.0000 mg | ORAL_TABLET | Freq: Two times a day (BID) | ORAL | Status: DC

## 2014-10-15 NOTE — Telephone Encounter (Signed)
Rx filled by [facility] (Dr. Claiborne Billings)

## 2014-10-15 NOTE — Telephone Encounter (Signed)
Pt need a new prescription for Ranexa for the company that the medicine comes from please.Please fax this [phone].

## 2014-10-15 NOTE — Telephone Encounter (Signed)
John Peck, does this patient have Ranexa Connect assistance program?

## 2014-10-18 ENCOUNTER — Encounter (HOSPITAL_COMMUNITY): Payer: Self-pay | Admitting: Cardiovascular Disease

## 2014-10-25 ENCOUNTER — Telehealth: Payer: Self-pay | Admitting: Cardiovascular Disease

## 2014-10-25 NOTE — Telephone Encounter (Signed)
Pt would like some samples of Ranexa please. °

## 2014-10-25 NOTE — Telephone Encounter (Signed)
Told pthe can pick up samples of ranexa at front desk

## 2014-11-08 ENCOUNTER — Telehealth: Payer: Self-pay | Admitting: Oncology

## 2014-11-08 NOTE — Telephone Encounter (Signed)
, °

## 2014-11-30 ENCOUNTER — Other Ambulatory Visit: Payer: Medicare Other

## 2014-11-30 ENCOUNTER — Ambulatory Visit: Payer: Medicare Other

## 2014-12-17 ENCOUNTER — Other Ambulatory Visit: Payer: Self-pay | Admitting: *Deleted

## 2014-12-17 DIAGNOSIS — Z862 Personal history of diseases of the blood and blood-forming organs and certain disorders involving the immune mechanism: Secondary | ICD-10-CM

## 2014-12-18 ENCOUNTER — Ambulatory Visit (HOSPITAL_BASED_OUTPATIENT_CLINIC_OR_DEPARTMENT_OTHER): Payer: Medicare Other | Admitting: Oncology

## 2014-12-18 ENCOUNTER — Other Ambulatory Visit (HOSPITAL_BASED_OUTPATIENT_CLINIC_OR_DEPARTMENT_OTHER): Payer: Medicare Other

## 2014-12-18 ENCOUNTER — Telehealth: Payer: Self-pay | Admitting: Oncology

## 2014-12-18 VITALS — BP 111/51 | HR 60 | Temp 97.4°F | Resp 18 | Ht 69.0 in | Wt 173.9 lb

## 2014-12-18 DIAGNOSIS — Z862 Personal history of diseases of the blood and blood-forming organs and certain disorders involving the immune mechanism: Secondary | ICD-10-CM

## 2014-12-18 LAB — CBC WITH DIFFERENTIAL/PLATELET
BASO%: 0.7 % (ref 0.0–2.0)
Basophils Absolute: 0 10*3/uL (ref 0.0–0.1)
EOS%: 5.4 % (ref 0.0–7.0)
Eosinophils Absolute: 0.2 10*3/uL (ref 0.0–0.5)
HEMATOCRIT: 36.5 % — AB (ref 38.4–49.9)
HGB: 11.9 g/dL — ABNORMAL LOW (ref 13.0–17.1)
LYMPH%: 22.4 % (ref 14.0–49.0)
MCH: 31.7 pg (ref 27.2–33.4)
MCHC: 32.5 g/dL (ref 32.0–36.0)
MCV: 97.7 fL (ref 79.3–98.0)
MONO#: 0.7 10*3/uL (ref 0.1–0.9)
MONO%: 19.2 % — AB (ref 0.0–14.0)
NEUT#: 2 10*3/uL (ref 1.5–6.5)
NEUT%: 52.3 % (ref 39.0–75.0)
PLATELETS: 140 10*3/uL (ref 140–400)
RBC: 3.74 10*6/uL — ABNORMAL LOW (ref 4.20–5.82)
RDW: 13.9 % (ref 11.0–14.6)
WBC: 3.9 10*3/uL — ABNORMAL LOW (ref 4.0–10.3)
lymph#: 0.9 10*3/uL (ref 0.9–3.3)

## 2014-12-18 LAB — FERRITIN CHCC: FERRITIN: 286 ng/mL (ref 22–316)

## 2014-12-18 LAB — DRAW EXTRA CLOT TUBE

## 2014-12-18 NOTE — Telephone Encounter (Signed)
S/w pt's wife confirming labs/ov mailed out sch to pt... KJ

## 2014-12-18 NOTE — Progress Notes (Signed)
  John Peck OFFICE PROGRESS NOTE   Diagnosis: Anemia  INTERVAL HISTORY:   John Peck has been followed at the Cancer center for many years after being diagnosed with iron deficiency anemia. He denies bleeding. He continues iron therapy. He has constipation. Good appetite.  Objective:  Vital signs in last 24 hours:  Blood pressure 111/51, pulse 60, temperature 97.4 F (36.3 C), temperature source Oral, resp. rate 18, height 5\' 9"  (1.753 m), weight 173 lb 14.4 oz (78.881 kg).    HEENT: Neck without mass Lymphatics: No cervical, supra-clavicular, axillary, or inguinal nodes Resp: Lungs clear bilaterally Cardio: Regular rate and rhythm GI: No hepatosplenomegaly, nontender, no mass Vascular: No leg edema   Lab Results:  Lab Results  Component Value Date   WBC 3.9* 12/18/2014   HGB 11.9* 12/18/2014   HCT 36.5* 12/18/2014   MCV 97.7 12/18/2014   PLT 140 12/18/2014   NEUTROABS 2.0 12/18/2014    Medications: I have reviewed the patient's current medications.  Assessment/Plan:  1. History of iron deficiency anemia dating to 2007, stool Hemoccult positive in 2012, negative upper and lower endoscopy by Dr. Michail Sermon in 2012  2. History of coronary disease, status post coronary artery bypass surgery  3. Pacemaker placement in 1989  4. Hypertension  5. Dyslipidemia  6. BPH, status post TURP in 1992  7. History of a liver abscess  8. COPD  Disposition:  John Peck is stable from a hematologic standpoint. He will continue iron. He would like to continue follow-up in the Hematology clinic. He will return for an office visit and CBC in one year.  Betsy Coder, MD  12/18/2014  11:59 AM

## 2015-01-01 ENCOUNTER — Encounter: Payer: Self-pay | Admitting: Internal Medicine

## 2015-01-01 DIAGNOSIS — I4891 Unspecified atrial fibrillation: Secondary | ICD-10-CM

## 2015-01-17 ENCOUNTER — Ambulatory Visit: Payer: Self-pay | Admitting: Cardiovascular Disease

## 2015-01-28 ENCOUNTER — Ambulatory Visit: Payer: Medicare Other | Admitting: Cardiovascular Disease

## 2015-02-20 ENCOUNTER — Other Ambulatory Visit: Payer: Self-pay | Admitting: *Deleted

## 2015-02-20 MED ORDER — ISOSORBIDE MONONITRATE ER 60 MG PO TB24: ORAL_TABLET | ORAL | Status: DC

## 2015-02-25 ENCOUNTER — Other Ambulatory Visit: Payer: Self-pay | Admitting: Cardiovascular Disease

## 2015-02-25 NOTE — Telephone Encounter (Signed)
Rx(s) sent to pharmacy electronically.  

## 2015-02-28 ENCOUNTER — Ambulatory Visit (INDEPENDENT_AMBULATORY_CARE_PROVIDER_SITE_OTHER): Payer: Medicare Other | Admitting: Cardiovascular Disease

## 2015-02-28 ENCOUNTER — Encounter: Payer: Self-pay | Admitting: Cardiovascular Disease

## 2015-02-28 VITALS — BP 110/68 | HR 62 | Ht 69.0 in | Wt 176.8 lb

## 2015-02-28 DIAGNOSIS — I200 Unstable angina: Secondary | ICD-10-CM | POA: Diagnosis not present

## 2015-02-28 DIAGNOSIS — E785 Hyperlipidemia, unspecified: Secondary | ICD-10-CM | POA: Insufficient documentation

## 2015-02-28 DIAGNOSIS — Z951 Presence of aortocoronary bypass graft: Secondary | ICD-10-CM

## 2015-02-28 DIAGNOSIS — I70219 Atherosclerosis of native arteries of extremities with intermittent claudication, unspecified extremity: Secondary | ICD-10-CM

## 2015-02-28 DIAGNOSIS — I10 Essential (primary) hypertension: Secondary | ICD-10-CM | POA: Diagnosis not present

## 2015-02-28 DIAGNOSIS — I480 Paroxysmal atrial fibrillation: Secondary | ICD-10-CM | POA: Diagnosis not present

## 2015-02-28 MED ORDER — ATORVASTATIN CALCIUM 20 MG PO TABS: 20.0000 mg | ORAL_TABLET | Freq: Every day | ORAL | Status: DC

## 2015-02-28 NOTE — Progress Notes (Signed)
Patient ID: John Peck, male   DOB: [birth year], 81 y.o.   MRN: [id number]     HPI: John Peck is a 81 y.o. male who presents to the office today for a 6 month follow-up cardiology evaluation.  John Peck has known CAD and underwent CABG surgery with LIMA to the LAD, a vein to the circumflex in 1989.  In 2004 he underwent stenting to his RCA. Cardiac catheterization in January 2013 showed an ejection fraction of 50% with mild inferior, distal inferior and apical lateral hypocontractility. He had significant native CAD with 80% stenosis of the LAD after the second diagonal vessel and septal perforator artery with total occlusion of the mid LAD. He was 57 - 70% ostial stenosis in the circumflex vessel followed by 40% proximal stenosis and 7080% distal stenosis. He had an occluded Y graft which previously sequentially supplied the intermediate and distal circumflex.There was a patent stented proximal RCA with 90% stenosis in the anterior RV marginal branch arising from the mid RCA and had 70-80% stenosis in the distal RCA beyond the crux with 78% stenosis the PDA takeoff. His LIMA to LAD was patent. He has been on medical therapy. He has a history of atrial fibrillation, sick sinus syndrome and status post permanent pacemaker in 2009 which is a [employer]. Jude device. Additional problems include iron deficiency anemia, hypertension, BPH, status post TURP in 1992, remote history of liver abscess, history of COPD.  In March 2015 due to recurrent increasing symptoms of chest pain.  He underwent repeat cardiac catheterization.  He was noted to have mild LV dysfunction with an EF of 45% inferior wall hypocontractility. There was significant multivessel native coronary obstructive disease with 50% ostial LAD stenosis followed by 80% proximal stenosis in the region of the first diagonal takeoff followed by 80% stenosis after the second diagonal vessel; total occlusion of the ramus intermediate vessel with evidence  for bidirectional retrograde collateralization; 40% ostial left circumflex stenosis; and widely patent proximal RCA stent with 80% stenosis in the anterior RV marginal branch, 40% mid or CVA stenosis, 50% distal, and 50% ostial and mid PDA stenoses. He had a atent LIMA graft supplying the mid LAD and an occluded Y graft which previously supplied the intermediate and distal circumflex.  He has been on increased medical regimen consisting of aspirin, Plavix, carvedilol 25 mg twice a day, high dose oral nitrate therapy, Ranexa 1000 g twice a day.  Amlodipine 10 mg in addition to Crestor 20 mg.  He also is taking Lasix 20 mg with supplemental potassium.  He was seen in the office by Lesia Hausen and admitted to bilateral lower extremity pain and weakness.  His blood pressure was somewhat low and his amlodipine dose was reduced from 10 to 5 mg. Lower extremity Doppler studies showed an ABI of 0.5 on the right and 0.58 on the left.  He had mild dilatation of his distal abdominal aorta.  There was equal to less than 50% diameter reduction.  The right SFA and right popliteal artery and there was a greater than 60% diameter reduction in the left SFA.  However, there was one vessel runoff via the peroneal artery bilaterally.  His symptoms are worse in the right lower extremity due to his distal disease.  I saw him 6 weeks ago at which time he had done well with his increased medical regimen and was tolerating Ranexa.  In addition,  high-dose nitrate therapy, amlodipine and carvedilol.  A follow-up Doppler study in  August 2015 showed ABI of 0.5 on the right and 0.58 on the left.  He has poor distal runoff.  His Doppler studies were reviewed with Dr. Gwenlyn Found who agreed with medical therapy.  He already was on aspirin and Plavix  Since I last saw him, he has done well with reference to experiencing any episodes of chest pain.  He has difficulty walking and walks with a cane.  He is unaware of palpitations.  He saw Dr.  Lovena Le in November 2015.  He had normal pacemaker device function.  His amlodipine was discontinued since his blood pressure was felt to be on the low side.  He presents for follow up evaluation.  Past Medical History  Diagnosis Date  . Hypertension   . Pacemaker Sept 2009    St Jude  . CAD (coronary artery disease)     CABG '89, PCI '04  . Intermediate coronary syndrome   . Arrhythmia     afib, bradycardia  . Diastolic dysfunction   . Anemia   . Sick sinus syndrome Sept 2009    ST Jude PTVDP  . History of COPD   . H/O: GI bleed     REMOTE HISTORY  . Hyperlipidemia     Past Surgical History  Procedure Laterality Date  . Coronary artery bypass graft  1989    had LIMA to his LAD, a vein to the circumflex. In 2004 underwent stenting to his right coronary artery.  . Cardiac catheterization  11/2011    EF 50%; significant native CAD w/80% stenosis of the LAD after 2nd giagonal vessel and septal perforating artery w/total occlusion of the mid left anterior descendiung.; 60-70% ostiasl stenosis in the circumflex vessel followed by 40% proximal stenosis and 70-80% distal circumflex stenosis;   . Cataract extraction  2004  . Prostatectomy    . Hemorrhoid surgery    . Insert / replace / remove pacemaker  07/17/08    DUAL-CHAMBER; [email or web address][email or web address] [employer]  . Coronary angioplasty with stent placement  2004    RCA  . Left heart catheterization with coronary/graft angiogram N/A 12/10/2011    Procedure: LEFT HEART CATHETERIZATION WITH Beatrix Fetters;  Surgeon: Troy Sine, MD;  Location: [facility] CATH LAB;  Service: Cardiovascular;  Laterality: N/A;  . Left heart catheterization with coronary/graft angiogram N/A 01/26/2014    Procedure: LEFT HEART CATHETERIZATION WITH Beatrix Fetters;  Surgeon: Troy Sine, MD;  Location: [facility] CATH LAB;  Service: Cardiovascular;  Laterality: N/A;    No Known Allergies  Current Outpatient Prescriptions  Medication Sig Dispense Refill  . aspirin 81  MG tablet Take 81 mg by mouth daily.     . carvedilol (COREG) 25 MG tablet Take 25 mg by mouth 2 (two) times daily with a meal.      . clopidogrel (PLAVIX) 75 MG tablet Take 75 mg by mouth daily.      . ferrous sulfate 325 (65 FE) MG tablet Take 325 mg by mouth 3 (three) times daily with meals.     . furosemide (LASIX) 20 MG tablet Take 20 mg by mouth daily.      . isosorbide mononitrate (IMDUR) 60 MG 24 hr tablet Take 2 tablets (120 mg total) by mouth every morning and 1 tablet (60 mg total) by mouth in the evening. 270 tablet 1  . isosorbide mononitrate (IMDUR) 60 MG 24 hr tablet TAKE 2 TABLETS BY MOUTH EVERY MORNING AND 1 TABLET EVERY EVENING 90 tablet 5  . lubiprostone (AMITIZA) 24 MCG capsule  Take 24 mcg by mouth daily as needed. For bowel movement    . Menthol, Topical Analgesic, (ICY HOT EX) Apply 1 application topically daily as needed (aches and pains).    . nitroGLYCERIN (NITROSTAT) 0.4 MG SL tablet Place 1 tablet (0.4 mg total) under the tongue as needed. (Patient taking differently: Place 0.4 mg under the tongue every 5 (five) minutes as needed for chest pain (MAX 3 TABLETS). ) 25 tablet 6  . pantoprazole (PROTONIX) 40 MG tablet Take 40 mg by mouth 2 (two) times daily.    . potassium citrate (UROCIT-K) 10 MEQ (1080 MG) SR tablet Take 10 mEq by mouth every morning.  12  . ranolazine (RANEXA) 1000 MG SR tablet Take 1 tablet (1,000 mg total) by mouth 2 (two) times daily. 180 tablet 3  . atorvastatin (LIPITOR) 20 MG tablet Take 1 tablet (20 mg total) by mouth daily. 30 tablet 11  . tamsulosin (FLOMAX) 0.4 MG CAPS capsule Take 0.4 mg by mouth daily.  2   No current facility-administered medications for this visit.    History   Social History  . Marital Status: Married    Spouse Name: N/A  . Number of Children: 5  . Years of Education: N/A   Occupational History  . RETIRED     TRUCK DRIVER   Social History Main Topics  . Smoking status: Former Smoker -- 2.00 packs/day for 65  years    Types: Cigarettes  . Smokeless tobacco: Former Systems developer    Quit date: 10/29/2008  . Alcohol Use: No  . Drug Use: No  . Sexual Activity: Not on file   Other Topics Concern  . Not on file   Social History Narrative    History reviewed. No pertinent family history.  ROS General: Negative; No fevers, chills, or night sweats;  HEENT: Negative; No changes in vision or hearing, sinus congestion, difficulty swallowing Pulmonary: Negative; No cough, wheezing, shortness of breath, hemoptysis Cardiovascular: See history of present illness  Positive for claudication, right lower extremity greater than left. GI: Negative; No nausea, vomiting, diarrhea, or abdominal pain GU: Negative; No dysuria, hematuria, or difficulty voiding Musculoskeletal: Negative; no myalgias, joint pain, or weakness Hematologic/Oncology: Negative; no easy bruising, bleeding Endocrine: Negative; no heat/cold intolerance; no diabetes Neuro: Negative; no changes in balance, headaches Skin: Negative; No rashes or skin lesions Psychiatric: Negative; No behavioral problems, depression Sleep: Negative; No snoring, daytime sleepiness, hypersomnolence, bruxism, restless legs, hypnogognic hallucinations, no cataplexy Other comprehensive 14 point system review is negative.   PE BP 110/68 mmHg  Pulse 62  Ht '5\' 9"'  (1.753 m)  Wt 176 lb 12.8 oz (80.196 kg)  BMI 26.10 kg/m2  General: Alert, oriented, no distress.  Skin: normal turgor, no rashes HEENT: Normocephalic, atraumatic. Pupils round and reactive; sclera anicteric;no lid lag.  Nose without nasal septal hypertrophy Mouth/Parynx benign; Mallinpatti scale 3 Neck: No JVD, no carotid bruits; normal carotid upstroke Lungs: clear to ausculatation and percussion; no wheezing or rales Chest wall: Nontender to palpation Heart: RRR, s1 s2 normal 1/6 systolic murmur; no diastolic murmur.  No S3 gallop.  No rubs, thrills or heaves.. Abdomen: Mild central adiposity ;soft,  nontender; no hepatosplenomehaly, BS+; abdominal aorta nontender and not dilated by palpation. Back: No CVA tenderness Pulses 2+ upper extremity.  Bilateral femoral bruits.  Decreased pulses distally in the dorsalis pedis. Extremities: no clubbing cyanosis or edema, Homan's sign negative  Neurologic: grossly nonfocal Psychologic: normal affect and mood.  ECG  (Independently read by me):  Ventricular paced rhythm at 62 bpm; unchanged  October 2015 ECG  (Independently read by me): Ventricular paced rhythm.  Unchanged from prior ECG.  LABS:  BMP Latest Ref Rng 07/02/2014 01/23/2014 01/19/2014  Glucose 70 - 99 mg/dL 91 90 98  BUN 6 - 23 mg/dL 21 18 17.1  Creatinine 0.50 - 1.35 mg/dL 1.36(H) 1.20 1.2  Sodium 135 - 145 mEq/L 137 139 145  Potassium 3.5 - 5.3 mEq/L 4.3 4.2 3.8  Chloride 96 - 112 mEq/L 103 105 -  CO2 19 - 32 mEq/L '26 27 26  ' Calcium 8.4 - 10.5 mg/dL 8.8 8.9 9.4    Hepatic Function Latest Ref Rng 07/02/2014 01/23/2014 01/19/2014  Total Protein 6.0 - 8.3 g/dL 7.2 7.1 7.6  Albumin 3.5 - 5.2 g/dL 4.1 3.7 3.4(L)  AST 0 - 37 U/L '10 11 11  ' ALT 0 - 53 U/L <8 <8 8  Alk Phosphatase 39 - 117 U/L 80 85 92  Total Bilirubin 0.2 - 1.2 mg/dL 0.7 0.7 0.51  Bilirubin, Direct - - - -     CBC Latest Ref Rng 12/18/2014 07/23/2014 04/20/2014  WBC 4.0 - 10.3 10e3/uL 3.9(L) 4.3 4.2  Hemoglobin 13.0 - 17.1 g/dL 11.9(L) 11.1(L) 12.0(L)  Hematocrit 38.4 - 49.9 % 36.5(L) 33.5(L) 35.9(L)  Platelets 140 - 400 10e3/uL 140 128(L) 146    Lab Results  Component Value Date   TSH 5.214* 01/23/2014      BNP    Component Value Date/Time   PROBNP 304.0* 11/12/2008 1154    Lipid Panel     Component Value Date/Time   CHOL 124 12/10/2011 0806   TRIG 90 12/10/2011 0806   HDL 46 12/10/2011 0806   CHOLHDL 2.7 12/10/2011 0806   VLDL 18 12/10/2011 0806   LDLCALC 60 12/10/2011 0806     RADIOLOGY: No results found.    ASSESSMENT AND PLAN: Mr. Hazen is an 81 year old gentleman who continues to  do fairly well 27 years since his initial bypass surgery in 11 years since his stent was placed to his RCA. He is on medical therapy for distal RCA disease as noted above and seems to be doing well without recurrent angina symptoms.  His last cardiac catheterization in 2015 was again reviewed.  is not significantly changed.  He has done well with his increased medical regimen is tolerating Ranexa 1000 twice a day in addition to high dose nitrate therapy, carvedilol 25 mg twice a day.  Due to his blood pressure getting somewhat low, he no longer is taking amlodipine.  He is not having any anginal symptoms.  He has been on simvastatin 40 mg.  Since he is on her next in the dose would need to be reduced, I elected to change him to atorvastatin at 20 mg, which is an equivalent dose.  I am recommending follow-up laboratory be obtained in a proximally 4 weeks for further evaluation.  He has normal pacemaker function.  He is not having significant edema.  There is no bleeding on dual antiplatelet therapy with aspirin, Plavix.  His GERD is controlled with pantoprazole.  I will see him in 6 months for cardiology reevaluation.  Time spent: 25 minutes  Troy Sine, MD, [facility]  02/28/2015 10:00 AM

## 2015-02-28 NOTE — Patient Instructions (Signed)
Your physician has recommended you make the following change in your medication: stop the simvastatin and start the new prescription for atorvastatin. This has already been sent to your pharmacy.  Your physician recommends that you return for lab work in: 2 months.  Your physician wants you to follow-up in: 6 months or sooner if needed. You will receive a reminder letter in the mail two months in advance. If you don't receive a letter, please call our office to schedule the follow-up appointment.

## 2015-03-04 ENCOUNTER — Ambulatory Visit: Payer: Self-pay | Admitting: Cardiovascular Disease

## 2015-03-13 ENCOUNTER — Other Ambulatory Visit: Payer: Self-pay | Admitting: Cardiology

## 2015-03-13 ENCOUNTER — Ambulatory Visit
Admission: RE | Admit: 2015-03-13 | Discharge: 2015-03-13 | Disposition: A | Discharge status: Home / Self Care | Payer: Medicare Other | Source: Ambulatory Visit | Attending: Cardiology | Admitting: Cardiology

## 2015-03-13 DIAGNOSIS — J209 Acute bronchitis, unspecified: Secondary | ICD-10-CM

## 2015-03-18 ENCOUNTER — Telehealth: Payer: Self-pay | Admitting: Cardiovascular Disease

## 2015-03-18 NOTE — Telephone Encounter (Signed)
[name] is calling because John Peck is having a lot of shortness of breath . Please call   Thanks

## 2015-03-18 NOTE — Telephone Encounter (Signed)
Pt. States has been having SOB for over a week and Dr. Montez Morita  Has put him on breathing treatments at home (Albuterol) and doesn't seem to be getting any better. Wife states pt. Is sleeping at present . Pt.s wife encouraged to call Dr. Montez Morita  For further instructions. Pt.s wife agreed with plan

## 2015-03-29 LAB — CBC
HCT: 38.3 % — ABNORMAL LOW (ref 39.0–52.0)
Hemoglobin: 12.7 g/dL — ABNORMAL LOW (ref 13.0–17.0)
MCH: 31.7 pg (ref 26.0–34.0)
MCHC: 33.2 g/dL (ref 30.0–36.0)
MCV: 95.5 fL (ref 78.0–100.0)
MPV: 9.8 fL (ref 8.6–12.4)
PLATELETS: 160 10*3/uL (ref 150–400)
RBC: 4.01 MIL/uL — ABNORMAL LOW (ref 4.22–5.81)
RDW: 14.8 % (ref 11.5–15.5)
WBC: 4.1 10*3/uL (ref 4.0–10.5)

## 2015-03-29 LAB — COMPREHENSIVE METABOLIC PANEL
AST: 11 U/L (ref 0–37)
Albumin: 3.9 g/dL (ref 3.5–5.2)
Alkaline Phosphatase: 89 U/L (ref 39–117)
BILIRUBIN TOTAL: 0.6 mg/dL (ref 0.2–1.2)
BUN: 23 mg/dL (ref 6–23)
CHLORIDE: 103 meq/L (ref 96–112)
CO2: 25 mEq/L (ref 19–32)
Calcium: 8.7 mg/dL (ref 8.4–10.5)
Creat: 1.25 mg/dL (ref 0.50–1.35)
Glucose, Bld: 80 mg/dL (ref 70–99)
Potassium: 4.6 mEq/L (ref 3.5–5.3)
Sodium: 136 mEq/L (ref 135–145)
Total Protein: 7.5 g/dL (ref 6.0–8.3)

## 2015-03-29 LAB — LIPID PANEL
CHOL/HDL RATIO: 4.2 ratio
Cholesterol: 203 mg/dL — ABNORMAL HIGH (ref 0–200)
HDL: 48 mg/dL (ref >=40)
LDL CALC: 131 mg/dL — AB (ref 0–99)
Triglycerides: 122 mg/dL (ref <150)
VLDL: 24 mg/dL (ref 0–40)

## 2015-03-29 LAB — TSH: TSH: 7.844 u[IU]/mL — ABNORMAL HIGH (ref 0.350–4.500)

## 2015-04-01 ENCOUNTER — Ambulatory Visit (INDEPENDENT_AMBULATORY_CARE_PROVIDER_SITE_OTHER): Payer: Medicare Other | Admitting: *Deleted

## 2015-04-01 DIAGNOSIS — I480 Paroxysmal atrial fibrillation: Secondary | ICD-10-CM

## 2015-04-01 DIAGNOSIS — I495 Sick sinus syndrome: Secondary | ICD-10-CM

## 2015-04-01 LAB — CUP PACEART INCLINIC DEVICE CHECK
Battery Impedance: 1900 Ohm
Brady Statistic RV Percent Paced: 98 %
Lead Channel Impedance Value: 393 Ohm
Lead Channel Impedance Value: 428 Ohm
Lead Channel Pacing Threshold Amplitude: 0.5 V
Lead Channel Pacing Threshold Amplitude: 1.125 V
Lead Channel Pacing Threshold Pulse Width: 0.4 ms
Lead Channel Sensing Intrinsic Amplitude: 4 mV
Lead Channel Sensing Intrinsic Amplitude: 8.6 mV
Lead Channel Setting Pacing Amplitude: 2 V
Lead Channel Setting Pacing Pulse Width: 0.4 ms
Lead Channel Setting Sensing Sensitivity: 2 mV
MDC IDC MSMT BATTERY VOLTAGE: 2.76 V
MDC IDC MSMT LEADCHNL RV PACING THRESHOLD PULSEWIDTH: 0.4 ms
MDC IDC PG SERIAL: 1266473
MDC IDC SESS DTM: 20160523135217
MDC IDC STAT BRADY RA PERCENT PACED: 91 %

## 2015-04-01 NOTE — Progress Notes (Signed)
Pacemaker check in clinic. Normal device function. Thresholds, sensing, impedances consistent with previous measurements. Device programmed to maximize longevity. Pt in AF <1% of time. Longest episode was 18 hours 17 minutes. + ASA/Plavix. No high ventricular rates noted. Device programmed at appropriate safety margins. Histogram distribution appropriate for patient activity level. Device programmed to optimize intrinsic conduction. Estimated longevity 3.50 to 4.50 years. Patient enrolled in TTM's with [employer]. [employer] every 3 months and ROV in November with GT.

## 2015-04-04 ENCOUNTER — Telehealth: Payer: Self-pay | Admitting: *Deleted

## 2015-04-04 DIAGNOSIS — R899 Unspecified abnormal finding in specimens from other organs, systems and tissues: Secondary | ICD-10-CM

## 2015-04-04 MED ORDER — ATORVASTATIN CALCIUM 40 MG PO TABS: 40.0000 mg | ORAL_TABLET | Freq: Every day | ORAL | Status: DC

## 2015-04-04 NOTE — Telephone Encounter (Signed)
-----   Message from Troy Sine, MD sent at 03/31/2015  9:50 PM EDT ----- Inc atorvastatin to 40 mg; re-check TSH, free T3 and T4

## 2015-04-04 NOTE — Telephone Encounter (Signed)
Informed patient's wife of lab results and recommendations. She tells me that patient has developed increased SOB and was placed on O2 by Dr. Wallene Huh. She wants him to be seen here to make sure that it's not his heart. Recommended to her if his symptoms does not get better since starting on the oxygen she can call back to get appointment to have him evaluated. Patient voiced understanding of both lab results and recommendations and appointment recommendation. Atorvastatin prescription and thyroid blood studies ordered.

## 2015-04-05 LAB — T4, FREE: FREE T4: 1.16 ng/dL (ref 0.80–1.80)

## 2015-04-05 LAB — TSH: TSH: 6.544 u[IU]/mL — ABNORMAL HIGH (ref 0.350–4.500)

## 2015-04-05 LAB — T3, FREE: T3, Free: 2.4 pg/mL (ref 2.3–4.2)

## 2015-04-09 ENCOUNTER — Encounter: Payer: Self-pay | Admitting: Internal Medicine

## 2015-04-12 ENCOUNTER — Encounter: Payer: Self-pay | Admitting: *Deleted

## 2015-04-15 ENCOUNTER — Other Ambulatory Visit: Payer: Self-pay | Admitting: Cardiology

## 2015-04-15 DIAGNOSIS — R1310 Dysphagia, unspecified: Secondary | ICD-10-CM

## 2015-04-17 ENCOUNTER — Ambulatory Visit
Admission: RE | Admit: 2015-04-17 | Discharge: 2015-04-17 | Disposition: A | Discharge status: Home / Self Care | Payer: Medicare Other | Source: Ambulatory Visit | Attending: Cardiology | Admitting: Cardiology

## 2015-04-17 DIAGNOSIS — R1310 Dysphagia, unspecified: Secondary | ICD-10-CM

## 2015-05-09 ENCOUNTER — Inpatient Hospital Stay (HOSPITAL_COMMUNITY): Payer: Medicare Other

## 2015-05-09 ENCOUNTER — Emergency Department (HOSPITAL_COMMUNITY): Payer: Medicare Other

## 2015-05-09 ENCOUNTER — Encounter (HOSPITAL_COMMUNITY): Payer: Self-pay | Admitting: *Deleted

## 2015-05-09 ENCOUNTER — Inpatient Hospital Stay (HOSPITAL_COMMUNITY)
Admission: EM | Admit: 2015-05-09 | Discharge: 2015-05-11 | DRG: 641 | Disposition: A | Discharge status: Home / Self Care | Payer: Medicare Other | Attending: Internal Medicine | Admitting: Internal Medicine

## 2015-05-09 DIAGNOSIS — Z862 Personal history of diseases of the blood and blood-forming organs and certain disorders involving the immune mechanism: Secondary | ICD-10-CM

## 2015-05-09 DIAGNOSIS — Z7982 Long term (current) use of aspirin: Secondary | ICD-10-CM

## 2015-05-09 DIAGNOSIS — E785 Hyperlipidemia, unspecified: Secondary | ICD-10-CM | POA: Diagnosis present

## 2015-05-09 DIAGNOSIS — Z955 Presence of coronary angioplasty implant and graft: Secondary | ICD-10-CM | POA: Diagnosis not present

## 2015-05-09 DIAGNOSIS — E875 Hyperkalemia: Secondary | ICD-10-CM | POA: Diagnosis present

## 2015-05-09 DIAGNOSIS — R2681 Unsteadiness on feet: Secondary | ICD-10-CM | POA: Diagnosis not present

## 2015-05-09 DIAGNOSIS — K219 Gastro-esophageal reflux disease without esophagitis: Secondary | ICD-10-CM | POA: Diagnosis present

## 2015-05-09 DIAGNOSIS — E860 Dehydration: Secondary | ICD-10-CM | POA: Diagnosis not present

## 2015-05-09 DIAGNOSIS — R531 Weakness: Secondary | ICD-10-CM

## 2015-05-09 DIAGNOSIS — R269 Unspecified abnormalities of gait and mobility: Secondary | ICD-10-CM

## 2015-05-09 DIAGNOSIS — I10 Essential (primary) hypertension: Secondary | ICD-10-CM

## 2015-05-09 DIAGNOSIS — I129 Hypertensive chronic kidney disease with stage 1 through stage 4 chronic kidney disease, or unspecified chronic kidney disease: Secondary | ICD-10-CM | POA: Diagnosis present

## 2015-05-09 DIAGNOSIS — I70219 Atherosclerosis of native arteries of extremities with intermittent claudication, unspecified extremity: Secondary | ICD-10-CM | POA: Diagnosis present

## 2015-05-09 DIAGNOSIS — I6789 Other cerebrovascular disease: Secondary | ICD-10-CM | POA: Diagnosis not present

## 2015-05-09 DIAGNOSIS — I5032 Chronic diastolic (congestive) heart failure: Secondary | ICD-10-CM | POA: Diagnosis present

## 2015-05-09 DIAGNOSIS — Z7902 Long term (current) use of antithrombotics/antiplatelets: Secondary | ICD-10-CM

## 2015-05-09 DIAGNOSIS — N182 Chronic kidney disease, stage 2 (mild): Secondary | ICD-10-CM | POA: Diagnosis present

## 2015-05-09 DIAGNOSIS — I495 Sick sinus syndrome: Secondary | ICD-10-CM

## 2015-05-09 DIAGNOSIS — R29898 Other symptoms and signs involving the musculoskeletal system: Secondary | ICD-10-CM

## 2015-05-09 DIAGNOSIS — Z87891 Personal history of nicotine dependence: Secondary | ICD-10-CM

## 2015-05-09 DIAGNOSIS — Z951 Presence of aortocoronary bypass graft: Secondary | ICD-10-CM | POA: Diagnosis not present

## 2015-05-09 DIAGNOSIS — R29810 Facial weakness: Secondary | ICD-10-CM

## 2015-05-09 DIAGNOSIS — Z950 Presence of cardiac pacemaker: Secondary | ICD-10-CM

## 2015-05-09 DIAGNOSIS — J449 Chronic obstructive pulmonary disease, unspecified: Secondary | ICD-10-CM | POA: Diagnosis present

## 2015-05-09 DIAGNOSIS — N179 Acute kidney failure, unspecified: Secondary | ICD-10-CM | POA: Diagnosis present

## 2015-05-09 DIAGNOSIS — I480 Paroxysmal atrial fibrillation: Secondary | ICD-10-CM | POA: Diagnosis present

## 2015-05-09 DIAGNOSIS — I2510 Atherosclerotic heart disease of native coronary artery without angina pectoris: Secondary | ICD-10-CM | POA: Diagnosis present

## 2015-05-09 DIAGNOSIS — N189 Chronic kidney disease, unspecified: Secondary | ICD-10-CM | POA: Diagnosis not present

## 2015-05-09 DIAGNOSIS — D509 Iron deficiency anemia, unspecified: Secondary | ICD-10-CM | POA: Diagnosis present

## 2015-05-09 HISTORY — DX: Paroxysmal atrial fibrillation: I48.0

## 2015-05-09 LAB — BASIC METABOLIC PANEL
ANION GAP: 7 (ref 5–15)
BUN: 24 mg/dL — ABNORMAL HIGH (ref 6–20)
CALCIUM: 8.6 mg/dL — AB (ref 8.9–10.3)
CO2: 24 mmol/L (ref 22–32)
Chloride: 103 mmol/L (ref 101–111)
Creatinine, Ser: 1.45 mg/dL — ABNORMAL HIGH (ref 0.61–1.24)
GFR calc Af Amer: 48 mL/min — ABNORMAL LOW (ref >60)
GFR, EST NON AFRICAN AMERICAN: 41 mL/min — AB (ref >60)
Glucose, Bld: 100 mg/dL — ABNORMAL HIGH (ref 65–99)
Potassium: 5.6 mmol/L — ABNORMAL HIGH (ref 3.5–5.1)
SODIUM: 134 mmol/L — AB (ref 135–145)

## 2015-05-09 LAB — URINALYSIS, ROUTINE W REFLEX MICROSCOPIC
Bilirubin Urine: NEGATIVE
GLUCOSE, UA: NEGATIVE mg/dL
HGB URINE DIPSTICK: NEGATIVE
KETONES UR: NEGATIVE mg/dL
LEUKOCYTES UA: NEGATIVE
Nitrite: NEGATIVE
PH: 7 (ref 5.0–8.0)
Protein, ur: NEGATIVE mg/dL
Specific Gravity, Urine: 1.015 (ref 1.005–1.030)
Urobilinogen, UA: 0.2 mg/dL (ref 0.0–1.0)

## 2015-05-09 LAB — CBC
HEMATOCRIT: 34.5 % — AB (ref 39.0–52.0)
Hemoglobin: 11.7 g/dL — ABNORMAL LOW (ref 13.0–17.0)
MCH: 31.5 pg (ref 26.0–34.0)
MCHC: 33.9 g/dL (ref 30.0–36.0)
MCV: 93 fL (ref 78.0–100.0)
PLATELETS: 144 10*3/uL — AB (ref 150–400)
RBC: 3.71 MIL/uL — ABNORMAL LOW (ref 4.22–5.81)
RDW: 14.5 % (ref 11.5–15.5)
WBC: 3.8 10*3/uL — ABNORMAL LOW (ref 4.0–10.5)

## 2015-05-09 LAB — CBG MONITORING, ED: GLUCOSE-CAPILLARY: 95 mg/dL (ref 65–99)

## 2015-05-09 LAB — HEPATIC FUNCTION PANEL
ALT: 11 U/L — ABNORMAL LOW (ref 17–63)
AST: 13 U/L — ABNORMAL LOW (ref 15–41)
Albumin: 3.5 g/dL (ref 3.5–5.0)
Alkaline Phosphatase: 79 U/L (ref 38–126)
BILIRUBIN INDIRECT: 0.6 mg/dL (ref 0.3–0.9)
BILIRUBIN TOTAL: 0.8 mg/dL (ref 0.3–1.2)
Bilirubin, Direct: 0.2 mg/dL (ref 0.1–0.5)
Total Protein: 7.1 g/dL (ref 6.5–8.1)

## 2015-05-09 LAB — ETHANOL: Alcohol, Ethyl (B): <5 mg/dL (ref <5)

## 2015-05-09 LAB — TSH: TSH: 4.256 u[IU]/mL (ref 0.350–4.500)

## 2015-05-09 LAB — CK: Total CK: 63 U/L (ref 49–397)

## 2015-05-09 LAB — PROTIME-INR
INR: 1.22 (ref 0.00–1.49)
Prothrombin Time: 15.6 seconds — ABNORMAL HIGH (ref 11.6–15.2)

## 2015-05-09 LAB — VITAMIN B12: Vitamin B-12: 576 pg/mL (ref 180–914)

## 2015-05-09 LAB — APTT: APTT: 30 s (ref 24–37)

## 2015-05-09 LAB — TROPONIN I

## 2015-05-09 MED ORDER — SIMVASTATIN 10 MG PO TABS
10.0000 mg | ORAL_TABLET | Freq: Every day | ORAL | Status: DC
  Administered 2015-05-10: 10 mg via ORAL
  Filled 2015-05-09 (×2): qty 1

## 2015-05-09 MED ORDER — ISOSORBIDE MONONITRATE ER 60 MG PO TB24
120.0000 mg | ORAL_TABLET | Freq: Every day | ORAL | Status: DC
  Administered 2015-05-10: 120 mg via ORAL
  Filled 2015-05-09: qty 2

## 2015-05-09 MED ORDER — NITROGLYCERIN 0.4 MG SL SUBL
0.4000 mg | SUBLINGUAL_TABLET | SUBLINGUAL | Status: DC | PRN
  Administered 2015-05-10: 0.4 mg via SUBLINGUAL

## 2015-05-09 MED ORDER — HYDRALAZINE HCL 20 MG/ML IJ SOLN
5.0000 mg | INTRAMUSCULAR | Status: DC | PRN
  Administered 2015-05-09 – 2015-05-10 (×2): 5 mg via INTRAVENOUS
  Filled 2015-05-09 (×2): qty 1

## 2015-05-09 MED ORDER — ISOSORBIDE MONONITRATE ER 60 MG PO TB24
60.0000 mg | ORAL_TABLET | Freq: Every day | ORAL | Status: DC
Start: 2015-05-09 — End: 2015-05-10
  Filled 2015-05-09 (×2): qty 1

## 2015-05-09 MED ORDER — NITROGLYCERIN 0.4 MG SL SUBL
0.4000 mg | SUBLINGUAL_TABLET | SUBLINGUAL | Status: DC | PRN
  Filled 2015-05-09: qty 25

## 2015-05-09 MED ORDER — RANOLAZINE ER 500 MG PO TB12
1000.0000 mg | ORAL_TABLET | Freq: Two times a day (BID) | ORAL | Status: DC
  Administered 2015-05-10 – 2015-05-11 (×3): 1000 mg via ORAL
  Filled 2015-05-09 (×5): qty 2

## 2015-05-09 MED ORDER — SODIUM CHLORIDE 0.9 % IV SOLN
100.0000 mL/h | INTRAVENOUS | Status: DC
  Administered 2015-05-09: 100 mL/h via INTRAVENOUS

## 2015-05-09 MED ORDER — FERROUS SULFATE 325 (65 FE) MG PO TABS
325.0000 mg | ORAL_TABLET | Freq: Three times a day (TID) | ORAL | Status: DC
  Administered 2015-05-10 – 2015-05-11 (×5): 325 mg via ORAL
  Filled 2015-05-09 (×8): qty 1

## 2015-05-09 MED ORDER — HEPARIN SODIUM (PORCINE) 5000 UNIT/ML IJ SOLN
5000.0000 [IU] | Freq: Three times a day (TID) | INTRAMUSCULAR | Status: DC
  Administered 2015-05-09 – 2015-05-11 (×5): 5000 [IU] via SUBCUTANEOUS
  Filled 2015-05-09 (×8): qty 1

## 2015-05-09 MED ORDER — LUBIPROSTONE 24 MCG PO CAPS: 24.0000 ug | ORAL_CAPSULE | Freq: Every day | ORAL | Status: DC | PRN

## 2015-05-09 MED ORDER — SODIUM CHLORIDE 0.9 % IV BOLUS (SEPSIS)
500.0000 mL | Freq: Once | INTRAVENOUS | Status: AC
  Administered 2015-05-09: 500 mL via INTRAVENOUS

## 2015-05-09 MED ORDER — ACETAMINOPHEN 650 MG RE SUPP: 650.0000 mg | Freq: Four times a day (QID) | RECTAL | Status: DC | PRN

## 2015-05-09 MED ORDER — CLOPIDOGREL BISULFATE 75 MG PO TABS
75.0000 mg | ORAL_TABLET | Freq: Every day | ORAL | Status: DC
  Administered 2015-05-10: 75 mg via ORAL
  Filled 2015-05-09 (×2): qty 1

## 2015-05-09 MED ORDER — ASPIRIN 81 MG PO CHEW
81.0000 mg | CHEWABLE_TABLET | Freq: Two times a day (BID) | ORAL | Status: DC
  Administered 2015-05-10 – 2015-05-11 (×3): 81 mg via ORAL
  Filled 2015-05-09 (×6): qty 1

## 2015-05-09 MED ORDER — SODIUM CHLORIDE 0.9 % IV SOLN: INTRAVENOUS | Status: DC

## 2015-05-09 MED ORDER — SODIUM POLYSTYRENE SULFONATE 15 GM/60ML PO SUSP
30.0000 g | Freq: Once | ORAL | Status: DC
  Filled 2015-05-09: qty 120

## 2015-05-09 MED ORDER — SODIUM CHLORIDE 0.9 % IJ SOLN
3.0000 mL | Freq: Two times a day (BID) | INTRAMUSCULAR | Status: DC
  Administered 2015-05-11: 3 mL via INTRAVENOUS

## 2015-05-09 MED ORDER — PANTOPRAZOLE SODIUM 40 MG PO TBEC
40.0000 mg | DELAYED_RELEASE_TABLET | Freq: Two times a day (BID) | ORAL | Status: DC
  Administered 2015-05-10 – 2015-05-11 (×3): 40 mg via ORAL
  Filled 2015-05-09 (×3): qty 1

## 2015-05-09 MED ORDER — CARVEDILOL 25 MG PO TABS
25.0000 mg | ORAL_TABLET | Freq: Two times a day (BID) | ORAL | Status: DC
  Administered 2015-05-10 – 2015-05-11 (×3): 25 mg via ORAL
  Filled 2015-05-09 (×7): qty 1

## 2015-05-09 MED ORDER — TAMSULOSIN HCL 0.4 MG PO CAPS
0.4000 mg | ORAL_CAPSULE | Freq: Every day | ORAL | Status: DC
  Administered 2015-05-10: 0.4 mg via ORAL
  Filled 2015-05-09 (×2): qty 1

## 2015-05-09 MED ORDER — SODIUM CHLORIDE 0.9 % IV SOLN
INTRAVENOUS | Status: DC
  Administered 2015-05-10: 75 mL/h via INTRAVENOUS
  Administered 2015-05-10: 1000 mL via INTRAVENOUS

## 2015-05-09 MED ORDER — ACETAMINOPHEN 325 MG PO TABS: 650.0000 mg | ORAL_TABLET | Freq: Four times a day (QID) | ORAL | Status: DC | PRN

## 2015-05-09 NOTE — ED Notes (Signed)
Pt CBG 95

## 2015-05-09 NOTE — ED Notes (Signed)
To CT scan

## 2015-05-09 NOTE — ED Notes (Signed)
Attempted report 

## 2015-05-09 NOTE — ED Notes (Signed)
Pt in from home via [facility], per report pts wife reports the pt having generalized weakness onset x 2-3 wks, pt reported having shaking & weakness today with inability to ambulate, pt ambulates at baseline, pt A&O x4, follows commands, speaks in complete sentences, equal grips & bil arm & leg strength, pt noted to have L sided facial droop upon arrival to ED-unknown if this is baseline, denies pain, no slurred speech noted

## 2015-05-09 NOTE — Consult Note (Addendum)
Stroke Consult    Chief Complaint: weakness HPI: John Peck is an 81 y.o. male hx of HTN, CAD, SSS s/p pacemaker, HLD presenting with 2-3 weeks of generalized weakness with acute worsening in the past 24 hours. They note he has had difficulty walking for the past few weeks but today it was severe enough that he was not able to walk at all. Notes the difficulty walking is due to pain and weakness in his bilateral hips and knees. Notes his hips and knees will give out if he stands too long, feels bette4r sitting down. Denies any unilateral weakness. Denies any dizziness or vertigo upon standing.   CT head imagining showed a remote left globus pallidus infarct but no acute process.   Date last known well: 04/25/2015 Time last known well: unclear tPA Given: no, outside of window, symptoms not consistent with stroke  Past Medical History  Diagnosis Date  . Hypertension   . Pacemaker Sept 2009    St Jude  . CAD (coronary artery disease)     CABG '89, PCI '04  . Intermediate coronary syndrome   . Arrhythmia     afib, bradycardia  . Diastolic dysfunction   . Anemia   . Sick sinus syndrome Sept 2009    ST Jude PTVDP  . History of COPD   . H/O: GI bleed     REMOTE HISTORY  . Hyperlipidemia   . CHF (congestive heart failure)     patient states he has a pacemaker    Past Surgical History  Procedure Laterality Date  . Coronary artery bypass graft  1989    had LIMA to his LAD, a vein to the circumflex. In 2004 underwent stenting to his right coronary artery.  . Cardiac catheterization  11/2011    EF 50%; significant native CAD w/80% stenosis of the LAD after 2nd giagonal vessel and septal perforating artery w/total occlusion of the mid left anterior descendiung.; 60-70% ostiasl stenosis in the circumflex vessel followed by 40% proximal stenosis and 70-80% distal circumflex stenosis;   . Cataract extraction  2004  . Prostatectomy    . Hemorrhoid surgery    . Insert / replace / remove  pacemaker  07/17/08    DUAL-CHAMBER; PPM-ST.JUDE MEDNET  . Coronary angioplasty with stent placement  2004    RCA  . Left heart catheterization with coronary/graft angiogram N/A 12/10/2011    Procedure: LEFT HEART CATHETERIZATION WITH Beatrix Fetters;  Surgeon: Troy Sine, MD;  Location: [facility] CATH LAB;  Service: Cardiovascular;  Laterality: N/A;  . Left heart catheterization with coronary/graft angiogram N/A 01/26/2014    Procedure: LEFT HEART CATHETERIZATION WITH Beatrix Fetters;  Surgeon: Troy Sine, MD;  Location: [facility] CATH LAB;  Service: Cardiovascular;  Laterality: N/A;    No family history on file. Social History:  reports that he has quit smoking. His smoking use included Cigarettes. He has a 130 pack-year smoking history. He quit smokeless tobacco use about 6 years ago. He reports that he does not drink alcohol or use illicit drugs.  Allergies: No Known Allergies   (Not in a hospital admission)  ROS: Out of a complete 14 system review, the patient complains of only the following symptoms, and all other reviewed systems are negative. +gait instability, weakness   Physical Examination: Filed Vitals:   05/09/15 1653  BP:   Pulse:   Temp: 97.8 F (36.6 C)  Resp:    Physical Exam  Constitutional: He appears well-developed and well-nourished.  Psych:  Affect appropriate to situation Eyes: No scleral injection HENT: No OP obstrucion Head: Normocephalic.  Cardiovascular: Normal rate and regular rhythm.  Respiratory: Effort normal and breath sounds normal.  GI: Soft. Bowel sounds are normal. No distension. There is no tenderness.  Skin: WDI   Neurologic Examination: Mental Status: Alert, oriented, thought content appropriate.  Speech fluent without evidence of aphasia.  Able to follow multi step commands without difficulty. Cranial Nerves: II: funduscopic exam wnl bilaterally, visual fields grossly normal, pupils equal, round, reactive to light and  accommodation III,IV, VI: ptosis not present, extra-ocular motions intact bilaterally V,VII: mild flattening right NLF,  facial light touch sensation normal bilaterally VIII: hearing normal bilaterally IX,X: gag reflex present XI: trapezius strength/neck flexion strength normal bilaterally XII: tongue strength normal  Motor: Right : Upper extremity    Left:     Upper extremity 5/5 deltoid       5/5 deltoid 5/5 biceps      5/5 biceps  5/5 triceps      5/5 triceps 5/5 hand grip      5/5 hand grip  Lower extremity     Lower extremity 5/5 hip flexor      5/5 hip flexor 5/5 quadricep      5/5 quadriceps  5/5 hamstrings     5/5 hamstrings 5/5 plantar flexion       5/5 plantar flexion 5/5 plantar extension     5/5 plantar extension Tone and bulk:normal tone throughout; no atrophy noted Sensory: Pinprick and light touch intact throughout, bilaterally Deep Tendon Reflexes: 1+ and symmetric throughout, absent AJs Plantars: Right: downgoing   Left: downgoing Cerebellar: normal finger-to-nose, and normal heel-to-shin test Gait: deferred  Laboratory Studies:   Basic Metabolic Panel:  Recent Labs Lab 05/09/15 1522  NA 134*  K 5.6*  CL 103  CO2 24  GLUCOSE 100*  BUN 24*  CREATININE 1.45*  CALCIUM 8.6*    Liver Function Tests:  Recent Labs Lab 05/09/15 1620  AST 13*  ALT 11*  ALKPHOS 79  BILITOT 0.8  PROT 7.1  ALBUMIN 3.5   No results for input(s): LIPASE, AMYLASE in the last 168 hours. No results for input(s): AMMONIA in the last 168 hours.  CBC:  Recent Labs Lab 05/09/15 1522  WBC 3.8*  HGB 11.7*  HCT 34.5*  MCV 93.0  PLT 144*    Cardiac Enzymes:  Recent Labs Lab 05/09/15 1620  TROPONINI <0.03    BNP: Invalid input(s): POCBNP  CBG:  Recent Labs Lab 05/09/15 [place]    Microbiology: Results for orders placed or performed during the hospital encounter of 05/28/11  Culture, blood (routine x 2)     Status: None   Collection Time:  05/29/11  1:20 AM  Result Value Ref Range Status   Specimen Description BLOOD BOTTLES DRAWN AEROBIC AND ANAEROBIC 5CC L AC  Final   Special Requests IMMUNE:NORM VAR  Final   Culture  Setup Time [id number]  Final   Culture NO GROWTH 5 DAYS  Final   Report Status 06/04/2011 FINAL  Final  Culture, blood (routine x 2)     Status: None   Collection Time: 05/29/11  1:30 AM  Result Value Ref Range Status   Specimen Description   Final    BLOOD BOTTLES DRAWN AEROBIC AND ANAEROBIC 5CC L HAND   Special Requests IMMUNE:NORM VAR  Final   Culture  Setup Time [id number]  Final   Culture NO GROWTH 5 DAYS  Final  Report Status 06/04/2011 FINAL  Final    Coagulation Studies:  Recent Labs  05/09/15 1620  LABPROT 15.6*  INR 1.22    Urinalysis:  Recent Labs Lab 05/09/15 1523  COLORURINE YELLOW  LABSPEC 1.015  PHURINE 7.0  GLUCOSEU NEGATIVE  HGBUR NEGATIVE  BILIRUBINUR NEGATIVE  KETONESUR NEGATIVE  PROTEINUR NEGATIVE  UROBILINOGEN 0.2  NITRITE NEGATIVE  LEUKOCYTESUR NEGATIVE    Lipid Panel:     Component Value Date/Time   CHOL 203* 03/28/2015 0713   TRIG 122 03/28/2015 0713   HDL 48 03/28/2015 0713   CHOLHDL 4.2 03/28/2015 0713   VLDL 24 03/28/2015 0713   LDLCALC 131* 03/28/2015 0713    HgbA1C: No results found for: HGBA1C  Urine Drug Screen:     Component Value Date/Time   LABOPIA NONE DETECTED 07/16/2008 2202   COCAINSCRNUR NONE DETECTED 07/16/2008 2202   LABBENZ POSITIVE* 07/16/2008 2202   AMPHETMU NONE DETECTED 07/16/2008 2202   THCU NONE DETECTED 07/16/2008 2202   LABBARB  07/16/2008 2202    NONE DETECTED        DRUG SCREEN FOR MEDICAL PURPOSES ONLY.  IF CONFIRMATION IS NEEDED FOR ANY PURPOSE, NOTIFY LAB WITHIN 5 DAYS.    Alcohol Level: No results for input(s): ETH in the last 168 hours.   Imaging: Ct Head Wo Contrast  05/09/2015   CLINICAL DATA:  Altered mental status and gait disorder  EXAM: CT HEAD WITHOUT CONTRAST  TECHNIQUE: Contiguous axial  images were obtained from the base of the skull through the vertex without intravenous contrast.  COMPARISON:  May 26, 2012  FINDINGS: Moderate diffuse atrophy is stable. There is no intracranial mass, hemorrhage, extra-axial fluid collection, or midline shift. There is mild small vessel disease in the centra semiovale bilaterally, stable. There is a prior small lacunar infarct in the left globus pallidus, stable. No acute infarct is apparent. No new gray-white compartment lesions are identified.  The bony calvarium appears intact.  The mastoid air cells are clear.  IMPRESSION: Small prior lacunar infarct in the left globus pallidus. Atrophy with mild periventricular small vessel disease. No acute infarct evident. No hemorrhage or mass effect.   Electronically Signed   By: Lowella Grip III M.D.   On: 05/09/2015 16:05    Assessment: 81 y.o. male hx of HTN, CAD, pacemaker, HLD presenting with 2-3 weeks of generalized weakness and difficulty walking with worsening in the past 24hrs to the point of being unable to ambulate. Patient is poor historian but it appears that his main difficulty is pain related weakness in his bilateral hips and knees causing difficulty walking. Neurological exam appears overall non-focal with intact strength. Suspect gait difficulties are multifactorial, lower suspicion for a CVA though this cannot be ruled out.   Plan: 1. Suggest hip and knee imaging 2. Check orthostatic vitals, check 2D echo 3. Check B12, TSH, CK 4. Rehab evaluation 5. Continue ASA 81mg  daily  Jim Like, DO [facility] [phone]  If 7pm- 7am, please page neurology on call as listed in [place]. 05/09/2015, 5:55 PM

## 2015-05-09 NOTE — ED Notes (Signed)
Per MRI staff, pt has a St. Jude pacemaker & cannot have an MRI, Vanita Panda, MD aware

## 2015-05-09 NOTE — H&P (Signed)
[facility] History and Physical  [name] [name]:[id number] DOB: 08-30-1924 DOA: 05/09/2015  Referring physician: ED physician PCP: Patricia Nettle, MD  Specialists:   Chief Complaint: weakness and difficult walking.  HPI: John Peck is a 81 y.o. male with PMH of HTN, pacemaker, HLD, GERD, CAD (s/p of CABG 1898 and stent 0160), diastolic congestive heart failure, anemia, COPD, history of GI bleeding, PAF not anticoagulate due to history of GI bleeding, chronic kidney disease-stage II, who presents with weakness and difficulty walking.  Patient reports that has been having weakness and difficult waling in the past 3 weeks, which has been progressively worsening in the past one or two days. He states that he has pain in both hips and knees, causing him difficulty walking. He does not have unilateral weakness, numbness or tingling sensations. No chest pain, shortness breath, cough, abdominal pain, diarrhea.  In ED, patient was found to have WBC 3.8, INR 1.22, PTT 30, negative troponin, negative urinalysis, temperature normal, bradycardia, potassium 5.6, AoCKD-II. CT head showed a remote left globus pallidus infarct, but no acute process. Patient is admitted to inpatient for further evaluation and treatment. Neurology was consulted.  Where does patient live?   At home  Can patient participate in ADLs? Barely  Review of Systems:   General: no fevers, chills, no changes in body weight, has fatigue HEENT: no blurry vision, hearing changes or sore throat Pulm: no dyspnea, coughing, wheezing CV: no chest pain, palpitations Abd: no nausea, vomiting, abdominal pain, diarrhea, constipation GU: no dysuria, burning on urination, increased urinary frequency, hematuria  Ext: no leg edema Neuro: no unilateral weakness, numbness, or tingling, no vision change or hearing loss. Skin: no rash MSK: No muscle spasm, no deformity, has limitation of range of movement in both hips. Heme: No  easy bruising.  Travel history: No recent long distant travel.  Allergy: No Known Allergies  Past Medical History  Diagnosis Date  . Hypertension   . Pacemaker Sept 2009    St Jude  . CAD (coronary artery disease)     CABG '89, PCI '04  . Intermediate coronary syndrome   . Arrhythmia     afib, bradycardia  . Diastolic dysfunction   . Anemia   . Sick sinus syndrome Sept 2009    ST Jude PTVDP  . History of COPD   . H/O: GI bleed     REMOTE HISTORY  . Hyperlipidemia   . CHF (congestive heart failure)     patient states he has a pacemaker  . PAF (paroxysmal atrial fibrillation)   . CKD (chronic kidney disease), stage II     Past Surgical History  Procedure Laterality Date  . Coronary artery bypass graft  1989    had LIMA to his LAD, a vein to the circumflex. In 2004 underwent stenting to his right coronary artery.  . Cardiac catheterization  11/2011    EF 50%; significant native CAD w/80% stenosis of the LAD after 2nd giagonal vessel and septal perforating artery w/total occlusion of the mid left anterior descendiung.; 60-70% ostiasl stenosis in the circumflex vessel followed by 40% proximal stenosis and 70-80% distal circumflex stenosis;   . Cataract extraction  2004  . Prostatectomy    . Hemorrhoid surgery    . Insert / replace / remove pacemaker  07/17/08    DUAL-CHAMBER; PPM-ST.JUDE MEDNET  . Coronary angioplasty with stent placement  2004    RCA  . Left heart catheterization with coronary/graft angiogram N/A 12/10/2011  Procedure: LEFT HEART CATHETERIZATION WITH Beatrix Fetters;  Surgeon: Troy Sine, MD;  Location: [facility] CATH LAB;  Service: Cardiovascular;  Laterality: N/A;  . Left heart catheterization with coronary/graft angiogram N/A 01/26/2014    Procedure: LEFT HEART CATHETERIZATION WITH Beatrix Fetters;  Surgeon: Troy Sine, MD;  Location: [facility] CATH LAB;  Service: Cardiovascular;  Laterality: N/A;    Social History:  reports that he has quit  smoking. His smoking use included Cigarettes. He has a 130 pack-year smoking history. He quit smokeless tobacco use about 6 years ago. He reports that he does not drink alcohol or use illicit drugs.  Family History:  Family History  Problem Relation Age of Onset  . Diabetes Father   . Heart disease Sister   . Heart disease Brother      Prior to Admission medications   Medication Sig Start Date End Date Taking? Authorizing Provider  aspirin 81 MG tablet Take 81 mg by mouth 2 (two) times daily.    Yes Historical Provider, MD  carvedilol (COREG) 25 MG tablet Take 25 mg by mouth 2 (two) times daily with a meal.     Yes Historical Provider, MD  clopidogrel (PLAVIX) 75 MG tablet Take 75 mg by mouth daily at 3 pm.    Yes Historical Provider, MD  ferrous sulfate 325 (65 FE) MG tablet Take 325 mg by mouth 3 (three) times daily with meals.    Yes Historical Provider, MD  furosemide (LASIX) 20 MG tablet Take 20 mg by mouth daily.     Yes Historical Provider, MD  isosorbide mononitrate (IMDUR) 60 MG 24 hr tablet Take 2 tablets (120 mg total) by mouth every morning and 1 tablet (60 mg total) by mouth in the evening. 02/20/15  Yes Troy Sine, MD  lubiprostone (AMITIZA) 24 MCG capsule Take 24 mcg by mouth daily as needed. For bowel movement   Yes Historical Provider, MD  Menthol, Topical Analgesic, (ICY HOT EX) Apply 1 application topically daily as needed (aches and pains).   Yes Historical Provider, MD  nitroGLYCERIN (NITROSTAT) 0.4 MG SL tablet Place 1 tablet (0.4 mg total) under the tongue as needed. Patient taking differently: Place 0.4 mg under the tongue every 5 (five) minutes as needed for chest pain (MAX 3 TABLETS).  01/19/14  Yes Troy Sine, MD  pantoprazole (PROTONIX) 40 MG tablet Take 40 mg by mouth 2 (two) times daily.   Yes Historical Provider, MD  potassium citrate (UROCIT-K) 10 MEQ (1080 MG) SR tablet Take 10 mEq by mouth every morning. 09/24/14  Yes Historical Provider, MD  ranolazine  (RANEXA) 1000 MG SR tablet Take 1 tablet (1,000 mg total) by mouth 2 (two) times daily. 10/15/14  Yes Troy Sine, MD  simvastatin (ZOCOR) 10 MG tablet Take 10 mg by mouth daily at 6 PM.   Yes Historical Provider, MD  tamsulosin (FLOMAX) 0.4 MG CAPS capsule Take 0.4 mg by mouth daily. 02/26/15  Yes Historical Provider, MD  atorvastatin (LIPITOR) 40 MG tablet Take 1 tablet (40 mg total) by mouth daily. Patient not taking: Reported on 05/09/2015 04/04/15   Troy Sine, MD    Physical Exam: Filed Vitals:   05/09/15 1845 05/09/15 1915 05/09/15 1930 05/09/15 2131  BP: 188/89 169/82  178/88  Pulse: 59 59 60 65  Temp:    98.6 F (37 C)  TempSrc:    Oral  Resp: 25 19 14 18   Height:    5\' 9"  (1.753 m)  Weight:  77.8 kg (171 lb 8.3 oz)  SpO2: 98% 99% 98% 97%   General: Not in acute distress HEENT:       Eyes: PERRL, EOMI, no scleral icterus.       ENT: No discharge from the ears and nose, no pharynx injection, no tonsillar enlargement.        Neck: No JVD, no bruit, no mass felt. Heme: No neck lymph node enlargement. Cardiac: S1/S2, irregularly irregular rhythm, No murmurs, No gallops or rubs. Pulm:  No rales, wheezing, rhonchi or rubs. Abd: Soft, nondistended, nontender, no rebound pain, no organomegaly, BS present. Ext: No pitting leg edema bilaterally. 2+DP/PT pulse bilaterally. Musculoskeletal: No joint deformities. Has tenderness over both hips and knees, but no joint redness, swelling or warmth. Skin: No rashes.  Neuro: Alert, oriented X3, cranial nerves II-XII grossly intact except for mild right facial droop, muscle strength 5/5 in both arms and 4/5 in both legs (symmetric), sensation to light touch intact. Brachial reflex 2+ bilaterally. Knee reflex 1+ bilaterally. Negative Babinski's sign. Normal finger to nose test. Psych: Patient is not psychotic, no suicidal or hemocidal ideation.  Labs on Admission:  Basic Metabolic Panel:  Recent Labs Lab 05/09/15 1522  NA 134*  K  5.6*  CL 103  CO2 24  GLUCOSE 100*  BUN 24*  CREATININE 1.45*  CALCIUM 8.6*   Liver Function Tests:  Recent Labs Lab 05/09/15 1620  AST 13*  ALT 11*  ALKPHOS 79  BILITOT 0.8  PROT 7.1  ALBUMIN 3.5   No results for input(s): LIPASE, AMYLASE in the last 168 hours. No results for input(s): AMMONIA in the last 168 hours. CBC:  Recent Labs Lab 05/09/15 1522  WBC 3.8*  HGB 11.7*  HCT 34.5*  MCV 93.0  PLT 144*   Cardiac Enzymes:  Recent Labs Lab 05/09/15 1620 05/09/15 1852  CKTOTAL  --  63  TROPONINI <0.03  --     BNP (last 3 results) No results for input(s): BNP in the last 8760 hours.  ProBNP (last 3 results) No results for input(s): PROBNP in the last 8760 hours.  CBG:  Recent Labs Lab 05/09/15 1554  GLUCAP 95    Radiological Exams on Admission: Ct Head Wo Contrast  05/09/2015   CLINICAL DATA:  Altered mental status and gait disorder  EXAM: CT HEAD WITHOUT CONTRAST  TECHNIQUE: Contiguous axial images were obtained from the base of the skull through the vertex without intravenous contrast.  COMPARISON:  May 26, 2012  FINDINGS: Moderate diffuse atrophy is stable. There is no intracranial mass, hemorrhage, extra-axial fluid collection, or midline shift. There is mild small vessel disease in the centra semiovale bilaterally, stable. There is a prior small lacunar infarct in the left globus pallidus, stable. No acute infarct is apparent. No new gray-white compartment lesions are identified.  The bony calvarium appears intact.  The mastoid air cells are clear.  IMPRESSION: Small prior lacunar infarct in the left globus pallidus. Atrophy with mild periventricular small vessel disease. No acute infarct evident. No hemorrhage or mass effect.   Electronically Signed   By: Lowella Grip III M.D.   On: 05/09/2015 16:05    EKG: Independently reviewed.  Abnormal findings:           Not done in ED, will get one.   Assessment/Plan Principal Problem:    Weakness Active Problems:   Essential hypertension   S/P CABG x 2 '89. RCA stent'04. last cath Jan 2013   PACEMAKER, PERMANENT- [address] Jude  Sept 2009   History of iron deficiency anemia   Sinoatrial node dysfunction   PAF (paroxysmal atrial fibrillation)   Atherosclerosis of lower extremity with claudication   Hyperlipidemia LDL goal <70   Hyperkalemia   Acute on chronic kidney failure   Chronic diastolic CHF (congestive heart failure)  Weakness: etiology is not clear. Neurology was consulted. Dr. Janann Colonel saw patient, lower suspicion for CVA though this cannot be ruled out per Dr. Janann Colonel. It seems to be pain related weakness in his bilateral hips and knees causing difficulty walking per Dr. Janann Colonel. CT-head showed a remote left globus pallidus infarct, but no acute process.   -Will admit to tele bed - Appreciate neurology's consultation, will follow up recommendations as follows: 1. Suggest hip and knee imaging, will get X-ray of both hips and knees 2. Check orthostatic vitals, check 2D echo 3. Check B12, TSH, CK 4. Rehab evaluation, PT/OT 5. Continue home dose ASA  -Patient is also on Plavix  Hyperkalemia: K=5.6 without EKG change. Likely iatrogenic and worsening renal Fx. Patient is on potassium supplements at home. -hold potassium citrate -give one dose of Kayexalate, 30 gram 1 -repeat BMP in AM  Essential hypertension: - Coreg  CAD: S/P CABG x 2 '89 and stent 2004. No chest pain now - Aspirin, Plavix, Imdur, prn Nitroglycerin, Coreg,  PAF and Sinoatrial node dysfunction: CHA2DS2-VASc Score is 5 , needs oral anticoagulation. Heart rate is well controlled. Patient is not on any anti-coagulatants at home. Patient has history of GIB, therefore is not a good candidate for anticoagulation.  -continue coreg -tele monitoring -has PACEMAKER, PERMANENT- St Jude Sept 2009  Iron deficiency anemia: hgb 11.7. -continue ferrous sulfate  HLD:  LDL was 131 on 03/28/15. -continue Zocor, may  increase dose from 10 to 20 mg daily if CK comes back normal  AoCKD-III: Baseline Cre is 1.2, his Cre is 1.45 on admission. Likely due to prerenal secondary to dehydration and continuation of diuretics. - IVF: 1L NS and then 75 cc/h - Check FeUrea - US-renal - Follow up renal function by BMP - Hold lasix  Chronic diastolic CHF: 2-D echo on 05/19/12 showed EF 55%. Patient is on low-dose Lasix at home. CHF is compensated on admission. No any leg edema. Patient is clinically dry. -Hold Lasix due to AoCKD-III -Check BNP -ASA and coreg   DVT ppx: SQ Heparin   Code Status: Full code Family Communication: Yes, patient's daughter  at bed side Disposition Plan: Admit to inpatient   Date of Service 05/09/2015    Ivor Costa [facility] Pager [phone]  If 7PM-7AM, please contact night-coverage [email or web address] Password [email or web address] 05/09/2015, 10:04 PM

## 2015-05-09 NOTE — ED Provider Notes (Signed)
CSN: [id number]     Arrival date & time 05/09/15  1455 History   First MD Initiated Contact with Patient 05/09/15 1504     Chief Complaint  Patient presents with  . Weakness     HPI  Patient presents with concern of generalized weakness, progressive. Patient was generally well until about 3 weeks ago. For approximately one week he had mild disequilibrium.  This developed into generalized weakness, and over the past 24 hours he described substantial increase, including new difficulty with ambulation secondary to weakness. He specifically denies any focal weakness, confusion, disorientation, chest pain, dyspnea, belly pain. History denies any incontinence, change in bowel movements, bladder function. No new medication or diet. No clear precipitant   Past Medical History  Diagnosis Date  . Hypertension   . Pacemaker Sept 2009    St Jude  . CAD (coronary artery disease)     CABG '89, PCI '04  . Intermediate coronary syndrome   . Arrhythmia     afib, bradycardia  . Diastolic dysfunction   . Anemia   . Sick sinus syndrome Sept 2009    ST Jude PTVDP  . History of COPD   . H/O: GI bleed     REMOTE HISTORY  . Hyperlipidemia   . CHF (congestive heart failure)     patient states he has a pacemaker   Past Surgical History  Procedure Laterality Date  . Coronary artery bypass graft  1989    had LIMA to his LAD, a vein to the circumflex. In 2004 underwent stenting to his right coronary artery.  . Cardiac catheterization  11/2011    EF 50%; significant native CAD w/80% stenosis of the LAD after 2nd giagonal vessel and septal perforating artery w/total occlusion of the mid left anterior descendiung.; 60-70% ostiasl stenosis in the circumflex vessel followed by 40% proximal stenosis and 70-80% distal circumflex stenosis;   . Cataract extraction  2004  . Prostatectomy    . Hemorrhoid surgery    . Insert / replace / remove pacemaker  07/17/08    DUAL-CHAMBER; PPM-ST.JUDE MEDNET  .  Coronary angioplasty with stent placement  2004    RCA  . Left heart catheterization with coronary/graft angiogram N/A 12/10/2011    Procedure: LEFT HEART CATHETERIZATION WITH Beatrix Fetters;  Surgeon: Troy Sine, MD;  Location: [facility] CATH LAB;  Service: Cardiovascular;  Laterality: N/A;  . Left heart catheterization with coronary/graft angiogram N/A 01/26/2014    Procedure: LEFT HEART CATHETERIZATION WITH Beatrix Fetters;  Surgeon: Troy Sine, MD;  Location: [facility] CATH LAB;  Service: Cardiovascular;  Laterality: N/A;   No family history on file. History  Substance Use Topics  . Smoking status: Former Smoker -- 2.00 packs/day for 65 years    Types: Cigarettes  . Smokeless tobacco: Former Systems developer    Quit date: 10/29/2008  . Alcohol Use: No    Review of Systems  Constitutional:       Per HPI, otherwise negative  HENT:       Per HPI, otherwise negative  Respiratory:       Per HPI, otherwise negative  Cardiovascular:       Per HPI, otherwise negative  Gastrointestinal: Negative for vomiting.  Endocrine:       Negative aside from HPI  Genitourinary:       Neg aside from HPI   Musculoskeletal:       Per HPI, otherwise negative  Skin: Negative.   Neurological: Positive for facial asymmetry and weakness.  Negative for seizures, syncope and numbness.      Allergies  Review of patient's allergies indicates no known allergies.  Home Medications   Prior to Admission medications   Medication Sig Start Date End Date Taking? Authorizing Provider  aspirin 81 MG tablet Take 81 mg by mouth daily.     Historical Provider, MD  atorvastatin (LIPITOR) 40 MG tablet Take 1 tablet (40 mg total) by mouth daily. 04/04/15   Troy Sine, MD  carvedilol (COREG) 25 MG tablet Take 25 mg by mouth 2 (two) times daily with a meal.      Historical Provider, MD  clopidogrel (PLAVIX) 75 MG tablet Take 75 mg by mouth daily.      Historical Provider, MD  ferrous sulfate 325 (65 FE) MG  tablet Take 325 mg by mouth 3 (three) times daily with meals.     Historical Provider, MD  furosemide (LASIX) 20 MG tablet Take 20 mg by mouth daily.      Historical Provider, MD  isosorbide mononitrate (IMDUR) 60 MG 24 hr tablet Take 2 tablets (120 mg total) by mouth every morning and 1 tablet (60 mg total) by mouth in the evening. 02/20/15   Troy Sine, MD  lubiprostone (AMITIZA) 24 MCG capsule Take 24 mcg by mouth daily as needed. For bowel movement    Historical Provider, MD  Menthol, Topical Analgesic, (ICY HOT EX) Apply 1 application topically daily as needed (aches and pains).    Historical Provider, MD  nitroGLYCERIN (NITROSTAT) 0.4 MG SL tablet Place 1 tablet (0.4 mg total) under the tongue as needed. Patient taking differently: Place 0.4 mg under the tongue every 5 (five) minutes as needed for chest pain (MAX 3 TABLETS).  01/19/14   Troy Sine, MD  pantoprazole (PROTONIX) 40 MG tablet Take 40 mg by mouth 2 (two) times daily.    Historical Provider, MD  potassium citrate (UROCIT-K) 10 MEQ (1080 MG) SR tablet Take 10 mEq by mouth every morning. 09/24/14   Historical Provider, MD  ranolazine (RANEXA) 1000 MG SR tablet Take 1 tablet (1,000 mg total) by mouth 2 (two) times daily. 10/15/14   Troy Sine, MD  tamsulosin (FLOMAX) 0.4 MG CAPS capsule Take 0.4 mg by mouth daily. 02/26/15   Historical Provider, MD   BP 164/79 mmHg  Pulse 64  Temp(Src) 98.1 F (36.7 C) (Oral)  Resp 18  Ht 5\' 9"  (1.753 m)  Wt 172 lb (78.019 kg)  BMI 25.39 kg/m2  SpO2 98% Physical Exam  Constitutional: He is oriented to person, place, and time. He appears well-developed. No distress.  HENT:  Head: Normocephalic and atraumatic.  Smooth left nasolabial fold compared to contralateral side  Eyes: Conjunctivae and EOM are normal.  Cardiovascular: Normal rate and regular rhythm.   Pulmonary/Chest: Effort normal. No stridor. No respiratory distress.  Abdominal: He exhibits no distension.  Musculoskeletal:  He exhibits no edema.  Neurological: He is alert and oriented to person, place, and time. He displays atrophy. He displays no tremor. A cranial nerve deficit is present. He exhibits normal muscle tone. He displays no seizure activity. Coordination normal.  Skin: Skin is warm and dry.  Psychiatric: He has a normal mood and affect.  Nursing note and vitals reviewed.   ED Course  Procedures (including critical care time) Labs Review Labs Reviewed  CBC - Abnormal; Notable for the following:    WBC 3.8 (*)    RBC 3.71 (*)    Hemoglobin 11.7 (*)  HCT 34.5 (*)    Platelets 144 (*)    All other components within normal limits  BASIC METABOLIC PANEL - Abnormal; Notable for the following:    Sodium 134 (*)    Potassium 5.6 (*)    Glucose, Bld 100 (*)    BUN 24 (*)    Creatinine, Ser 1.45 (*)    Calcium 8.6 (*)    GFR calc non Af Amer 41 (*)    GFR calc Af Amer 48 (*)    All other components within normal limits  PROTIME-INR - Abnormal; Notable for the following:    Prothrombin Time 15.6 (*)    All other components within normal limits  HEPATIC FUNCTION PANEL - Abnormal; Notable for the following:    AST 13 (*)    ALT 11 (*)    All other components within normal limits  URINALYSIS, ROUTINE W REFLEX MICROSCOPIC (NOT AT [facility])  APTT  TROPONIN I  ETHANOL  URINE RAPID DRUG SCREEN, HOSP PERFORMED  CBG MONITORING, ED    Imaging Review Ct Head Wo Contrast  05/09/2015   CLINICAL DATA:  Altered mental status and gait disorder  EXAM: CT HEAD WITHOUT CONTRAST  TECHNIQUE: Contiguous axial images were obtained from the base of the skull through the vertex without intravenous contrast.  COMPARISON:  May 26, 2012  FINDINGS: Moderate diffuse atrophy is stable. There is no intracranial mass, hemorrhage, extra-axial fluid collection, or midline shift. There is mild small vessel disease in the centra semiovale bilaterally, stable. There is a prior small lacunar infarct in the left globus pallidus,  stable. No acute infarct is apparent. No new gray-white compartment lesions are identified.  The bony calvarium appears intact.  The mastoid air cells are clear.  IMPRESSION: Small prior lacunar infarct in the left globus pallidus. Atrophy with mild periventricular small vessel disease. No acute infarct evident. No hemorrhage or mass effect.   Electronically Signed   By: Lowella Grip III M.D.   On: 05/09/2015 16:05    EKG with artifact, but generally unremarkable pacemaker spikes, intraventricular conduction block, rate 64, abnormal  Monitor with pacemaker activity, rate 65, abnormal Pulse ox 100% room air normal  6:21 PM Patient has received IV fluids. I discussed his case with our neurology colleagues. There is CT evidence for globus pallidus stroke, but patient has pacemaker not compatible with MRI.  On re-exam the patient appears calm.   MDM   Patient presents with progressive weakness, new left facial droop. Patient is awake, alert, afebrile, white appropriately. However, the patient does have a new cranial nerve deficit, and   with his description of new disequilibrium, weakness, CT scans performed. There is evidence for stroke, but the patient is unable to have MRI for further evaluation. Patient care coordinated with neurology. Patient was also found to have hyperkalemia, but no notable EKG changes. Patient received fluid resuscitation for his hyperkalemia, mild change in creatinine. Patient was admitted for further evaluation and management.  Carmin Muskrat, MD 05/09/15 [phone]

## 2015-05-09 NOTE — Progress Notes (Signed)
Report received, pt adm to 5W rm 17 via stretcher from ED, a/o x 4, lung cl bilat lobes, pt w/sob on exertion. Skin C/D and intact, no sign of skin breakdown present. IVF NS infusing at 170ml/hr to rt hand, site benign. Plan of care initiated, pt informed of falls protocols, use of call light, daughter remains at bedside, expressed understanding.

## 2015-05-10 ENCOUNTER — Inpatient Hospital Stay (HOSPITAL_COMMUNITY): Payer: Medicare Other

## 2015-05-10 DIAGNOSIS — I5032 Chronic diastolic (congestive) heart failure: Secondary | ICD-10-CM

## 2015-05-10 DIAGNOSIS — I6789 Other cerebrovascular disease: Secondary | ICD-10-CM

## 2015-05-10 DIAGNOSIS — R531 Weakness: Secondary | ICD-10-CM

## 2015-05-10 DIAGNOSIS — N189 Chronic kidney disease, unspecified: Secondary | ICD-10-CM

## 2015-05-10 DIAGNOSIS — N179 Acute kidney failure, unspecified: Secondary | ICD-10-CM

## 2015-05-10 LAB — RAPID URINE DRUG SCREEN, HOSP PERFORMED
Amphetamines: NOT DETECTED
BARBITURATES: NOT DETECTED
Benzodiazepines: NOT DETECTED
Cocaine: NOT DETECTED
Opiates: NOT DETECTED
Tetrahydrocannabinol: NOT DETECTED

## 2015-05-10 LAB — BASIC METABOLIC PANEL
ANION GAP: 10 (ref 5–15)
BUN: 19 mg/dL (ref 6–20)
CALCIUM: 8.5 mg/dL — AB (ref 8.9–10.3)
CHLORIDE: 102 mmol/L (ref 101–111)
CO2: 21 mmol/L — ABNORMAL LOW (ref 22–32)
Creatinine, Ser: 1.23 mg/dL (ref 0.61–1.24)
GFR calc non Af Amer: 50 mL/min — ABNORMAL LOW (ref >60)
GFR, EST AFRICAN AMERICAN: 58 mL/min — AB (ref >60)
GLUCOSE: 80 mg/dL (ref 65–99)
Potassium: 4.8 mmol/L (ref 3.5–5.1)
Sodium: 133 mmol/L — ABNORMAL LOW (ref 135–145)

## 2015-05-10 LAB — TROPONIN I: Troponin I: <0.03 ng/mL (ref <0.031)

## 2015-05-10 LAB — CBC
HCT: 35.1 % — ABNORMAL LOW (ref 39.0–52.0)
Hemoglobin: 11.7 g/dL — ABNORMAL LOW (ref 13.0–17.0)
MCH: 31 pg (ref 26.0–34.0)
MCHC: 33.3 g/dL (ref 30.0–36.0)
MCV: 93.1 fL (ref 78.0–100.0)
Platelets: 136 10*3/uL — ABNORMAL LOW (ref 150–400)
RBC: 3.77 MIL/uL — ABNORMAL LOW (ref 4.22–5.81)
RDW: 14.3 % (ref 11.5–15.5)
WBC: 4.1 10*3/uL (ref 4.0–10.5)

## 2015-05-10 LAB — GLUCOSE, CAPILLARY: Glucose-Capillary: 89 mg/dL (ref 65–99)

## 2015-05-10 LAB — CREATININE, URINE, RANDOM: CREATININE, URINE: 34.19 mg/dL

## 2015-05-10 MED ORDER — LORATADINE 10 MG PO TABS
10.0000 mg | ORAL_TABLET | Freq: Every day | ORAL | Status: DC
  Administered 2015-05-11: 10 mg via ORAL
  Filled 2015-05-10: qty 1

## 2015-05-10 MED ORDER — SODIUM POLYSTYRENE SULFONATE 15 GM/60ML PO SUSP
30.0000 g | Freq: Once | ORAL | Status: AC
  Administered 2015-05-10: 30 g via RECTAL
  Filled 2015-05-10: qty 120

## 2015-05-10 MED ORDER — ISOSORBIDE MONONITRATE ER 30 MG PO TB24
30.0000 mg | ORAL_TABLET | Freq: Every day | ORAL | Status: DC
  Administered 2015-05-10: 30 mg via ORAL
  Filled 2015-05-10 (×2): qty 1

## 2015-05-10 MED ORDER — ISOSORBIDE MONONITRATE ER 30 MG PO TB24: 30.0000 mg | ORAL_TABLET | Freq: Every day | ORAL | Status: DC

## 2015-05-10 MED ORDER — ISOSORBIDE MONONITRATE ER 60 MG PO TB24
60.0000 mg | ORAL_TABLET | Freq: Every day | ORAL | Status: DC
  Administered 2015-05-11: 60 mg via ORAL
  Filled 2015-05-10: qty 1

## 2015-05-10 NOTE — Progress Notes (Signed)
Referral for [facility] services made to [facility] at [facility]

## 2015-05-10 NOTE — Progress Notes (Signed)
Subjective: Patient is awake and oriented. He again notes this problem with his gait is a chronic problem.   Objective: Current vital signs: BP 146/69 mmHg  Pulse 65  Temp(Src) 98.2 F (36.8 C) (Oral)  Resp 18  Ht 5\' 9"  (1.753 m)  Wt 77.4 kg (170 lb 10.2 oz)  BMI 25.19 kg/m2  SpO2 99% Vital signs in last 24 hours: Temp:  [97.8 F (36.6 C)-98.6 F (37 C)] 98.2 F (36.8 C) (07/01 0800) Pulse Rate:  [57-66] 65 (07/01 0800) Resp:  [14-25] 18 (07/01 0800) BP: (136-188)/(62-97) 146/69 mmHg (07/01 0800) SpO2:  [97 %-100 %] 99 % (07/01 0800) Weight:  [77.4 kg (170 lb 10.2 oz)-78.019 kg (172 lb)] 77.4 kg (170 lb 10.2 oz) (07/01 0424)  Intake/Output from previous day: 06/30 0701 - 07/01 0700 In: 1000 [I.V.:1000] Out: 1350 [Urine:1250; Stool:100] Intake/Output this shift: Total I/O In: -  Out: 200 [Urine:200] Nutritional status: Diet Heart Room service appropriate?: Yes; Fluid consistency:: Thin  Neurologic Exam:  Mental Status: Alert, oriented, thought content appropriate.  Speech fluent without evidence of aphasia.  Able to follow 3 step commands without difficulty. Cranial Nerves: II:  Visual fields grossly normal, pupils equal, round, reactive to light and accommodation III,IV, VI: ptosis not present, extra-ocular motions intact bilaterally V,VII: smile symmetric, facial light touch sensation normal bilaterally VIII: hearing normal bilaterally IX,X: uvula rises symmetrically XI: bilateral shoulder shrug XII: midline tongue extension without atrophy or fasciculations  Motor: Right : Upper extremity   5/5    Left:     Upper extremity   5/5  Lower extremity   5/5     Lower extremity   5/5 Tone and bulk:normal tone throughout; no atrophy noted Sensory: Pinprick and light touch intact throughout, bilaterally Deep Tendon Reflexes:  Right: Upper Extremity   Left: Upper extremity   biceps (C-5 to C-6) 2/4   biceps (C-5 to C-6) 2/4 tricep (C7) 2/4    triceps (C7)  2/4 Brachioradialis (C6) 2/4  Brachioradialis (C6) 2/4  Lower Extremity Lower Extremity  quadriceps (L-2 to L-4) 1/4   quadriceps (L-2 to L-4) 1/4 Achilles (S1) 1/4   Achilles (S1) 1/4  Plantars: Right: downgoing   Left: downgoing Cerebellar: normal finger-to-nose,  normal heel-to-shin test Gait: Stiff leg and tends to retropulse.     Lab Results: Basic Metabolic Panel:  Recent Labs Lab 05/09/15 1522 05/10/15 0410  NA 134* 133*  K 5.6* 4.8  CL 103 102  CO2 24 21*  GLUCOSE 100* 80  BUN 24* 19  CREATININE 1.45* 1.23  CALCIUM 8.6* 8.5*    Liver Function Tests:  Recent Labs Lab 05/09/15 1620  AST 13*  ALT 11*  ALKPHOS 79  BILITOT 0.8  PROT 7.1  ALBUMIN 3.5   No results for input(s): LIPASE, AMYLASE in the last 168 hours. No results for input(s): AMMONIA in the last 168 hours.  CBC:  Recent Labs Lab 05/09/15 1522 05/10/15 0410  WBC 3.8* 4.1  HGB 11.7* 11.7*  HCT 34.5* 35.1*  MCV 93.0 93.1  PLT 144* 136*    Cardiac Enzymes:  Recent Labs Lab 05/09/15 1620 05/09/15 1852  CKTOTAL  --  63  TROPONINI <0.03  --     Lipid Panel: No results for input(s): CHOL, TRIG, HDL, CHOLHDL, VLDL, LDLCALC in the last 168 hours.  CBG:  Recent Labs Lab 05/09/15 1554 05/10/15 0810  GLUCAP 95 89    Microbiology: Results for orders placed or performed during the hospital encounter of 05/28/11  Culture, blood (routine x 2)     Status: None   Collection Time: 05/29/11  1:20 AM  Result Value Ref Range Status   Specimen Description BLOOD BOTTLES DRAWN AEROBIC AND ANAEROBIC 5CC L AC  Final   Special Requests IMMUNE:NORM VAR  Final   Culture  Setup Time [id number]  Final   Culture NO GROWTH 5 DAYS  Final   Report Status 06/04/2011 FINAL  Final  Culture, blood (routine x 2)     Status: None   Collection Time: 05/29/11  1:30 AM  Result Value Ref Range Status   Specimen Description   Final    BLOOD BOTTLES DRAWN AEROBIC AND ANAEROBIC 5CC L HAND   Special  Requests IMMUNE:NORM VAR  Final   Culture  Setup Time [id number]  Final   Culture NO GROWTH 5 DAYS  Final   Report Status 06/04/2011 FINAL  Final    Coagulation Studies:  Recent Labs  05/09/15 1620  LABPROT 15.6*  INR 1.22    Imaging: Dg Knee 1-2 Views Left  05/09/2015   CLINICAL DATA:  81 year old male with bilateral knee pain.  EXAM: LEFT KNEE - 1-2 VIEW  COMPARISON:  Radiograph dated 07/11/2010  FINDINGS: There is no fracture or dislocation. A small chronic appearing bony fragment noted along the inferior aspect of the patella. There is mild cardia calcinosis of the menisci, more prominent involving the lateral meniscus which may be seen with CPPD or osteoarthritis. Vascular calcifications noted in the posterior knee. The soft tissues are otherwise unremarkable.  IMPRESSION: No fracture or dislocation.  Mild chondrocalcinosis of the menisci.   Electronically Signed   By: Anner Crete M.D.   On: 05/09/2015 23:30   Dg Knee 1-2 Views Right  05/09/2015   CLINICAL DATA:  Bilateral knee and hip weakness  EXAM: RIGHT KNEE - 1-2 VIEW  COMPARISON:  None.  FINDINGS: There is chondrocalcinosis, particularly in the lateral compartment. There is no fracture. There is no dislocation. There is no bone lesion or bony destruction. There are prominent patellar enthesophytes.  IMPRESSION: No acute findings.  There is chondrocalcinosis.   Electronically Signed   By: Andreas Newport M.D.   On: 05/09/2015 23:29   Ct Head Wo Contrast  05/09/2015   CLINICAL DATA:  Altered mental status and gait disorder  EXAM: CT HEAD WITHOUT CONTRAST  TECHNIQUE: Contiguous axial images were obtained from the base of the skull through the vertex without intravenous contrast.  COMPARISON:  May 26, 2012  FINDINGS: Moderate diffuse atrophy is stable. There is no intracranial mass, hemorrhage, extra-axial fluid collection, or midline shift. There is mild small vessel disease in the centra semiovale bilaterally, stable.  There is a prior small lacunar infarct in the left globus pallidus, stable. No acute infarct is apparent. No new gray-white compartment lesions are identified.  The bony calvarium appears intact.  The mastoid air cells are clear.  IMPRESSION: Small prior lacunar infarct in the left globus pallidus. Atrophy with mild periventricular small vessel disease. No acute infarct evident. No hemorrhage or mass effect.   Electronically Signed   By: Lowella Grip III M.D.   On: 05/09/2015 16:05   Dg Hips Bilat With Pelvis 2v  05/09/2015   CLINICAL DATA:  Bilateral hip weakness  EXAM: BILATERAL HIP (WITH PELVIS) 2 VIEWS  COMPARISON:  None.  FINDINGS: Pelvic ring is intact. No acute fracture or dislocation is noted. Retained contrast in multiple colonic diverticula are seen. No gross soft tissue abnormality is seen.  IMPRESSION: No acute abnormality noted.   Electronically Signed   By: Inez Catalina M.D.   On: 05/09/2015 23:31    Medications:  Scheduled: . sodium chloride   Intravenous STAT  . aspirin  81 mg Oral BID  . carvedilol  25 mg Oral BID WC  . clopidogrel  75 mg Oral Q1500  . ferrous sulfate  325 mg Oral TID WC  . heparin  5,000 Units Subcutaneous 3 times per day  . isosorbide mononitrate  120 mg Oral Daily  . isosorbide mononitrate  60 mg Oral QHS  . pantoprazole  40 mg Oral BID  . ranolazine  1,000 mg Oral BID  . simvastatin  10 mg Oral q1800  . sodium chloride  3 mL Intravenous Q12H  . tamsulosin  0.4 mg Oral Daily    Assessment/Plan:   81 y.o. male hx of HTN, CAD, pacemaker, HLD presenting with 2-3 weeks possibly one year of generalized weakness and difficulty walking with worsening to the point of being unable to ambulate. Neurological examremains overall non-focal with intact strength. Continue to suspect gait difficulties are multifactorial. CT shows no acute infarct. Have discussed with Dr. Broadus John and feel patient would benefit from PT and possibly CIR.  No further neurology  recommendations. Neurology will S/O   Etta Quill PA-C [facility] [phone]  05/10/2015, 9:42 AM   Patient seen and evaluated. Agree with above assessment and plan. No further neurological workup at this time. Will benefit from rehab evaluation.   Jim Like, DO [facility] [phone]  If 7pm- 7am, please page neurology on call as listed in [place].

## 2015-05-10 NOTE — Progress Notes (Signed)
Spoke with patient, wife and daughter at bedside. Patient had received home health services previously but could not remember provider. Will wait for PT recommendation. Patient has assistive device at home and denies need for additional DME. Will continue to follow.

## 2015-05-10 NOTE — Progress Notes (Signed)
[facility] PROGRESS NOTE  John Peck TOI:[id number] DOB: 1924-06-21 DOA: 05/09/2015 PCP: Patricia Nettle, MD  Assessment/Plan: Generalized weakness -suspect multifactorial, age, deconditioning, BP meds, on high dose nitrates, alpha blocker, ranexa etc -CT without acute findings -Neuro consult appreciated -Monitor on tele, ECHO -hold Alpha blocker, Orthostatics normal now but has been on IVF for 12 h -TSH, B12 within normal limits  Hyperkalemia: K=5.6 without EKG change. - Likely iatrogenic and worsening renal Fx. Patient is on potassium supplements at home. - resolved, given kayexalate  Essential hypertension: - Coreg, BP soft, will cut down imdur dose in half  CAD: S/P CABG x 2 '89 and stent 2004. No chest pain now - Aspirin, Plavix, Imdur, prn Nitroglycerin, Coreg,  PAF and Sinoatrial node dysfunction: CHA2DS2-VASc Score is 5  - Patient is not on any anti-coagulatants due to history of GIB -continue coreg -has PACEMAKER, PERMANENT- St Jude Sept 2009  Iron deficiency anemia: hgb 11.7. -continue ferrous sulfate  HLD: LDL was 131 on 03/28/15. -continue Zocor, may increase dose from 10 to 20 mg daily if CK comes back normal  AoCKD-III: Baseline Cre is 1.2, his Cre is 1.45 on admission. Likely due to prerenal secondary to dehydration and continuation of diuretics. - improved back to baseline, lasix on hold  Chronic diastolic CHF: 2-D echo on 05/19/12 showed EF 55%. Patient is on low-dose Lasix at home. CHF is compensated on admission. No any leg edema. Patient is clinically dry. -Hold Lasix , resume in 1-2days -continue coreg  DVT ppx: SQ Heparin   Code Status: Full Code Family Communication: none at bedside, will call wife Disposition Plan: home in 1-2days  Consultants:  Neuro  HPI/Subjective: Feels ok, no complaints  Objective: Filed Vitals:   05/10/15 1200  BP: 109/71  Pulse: 59  Temp: 98.2 F (36.8 C)  Resp: 20    Intake/Output Summary  (Last 24 hours) at 05/10/15 1224 Last data filed at 05/10/15 0800  Gross per 24 hour  Intake   1000 ml  Output   1550 ml  Net   -550 ml   Filed Weights   05/09/15 1504 05/09/15 2131 05/10/15 0424  Weight: 78.019 kg (172 lb) 77.8 kg (171 lb 8.3 oz) 77.4 kg (170 lb 10.2 oz)    Exam:   General:  AAOx3  Cardiovascular: S1S2/RRR  Respiratory: CTAB  Abdomen: soft, NT, BS present  Musculoskeletal: no edema c/c  NEuro: non focal  Data Reviewed: Basic Metabolic Panel:  Recent Labs Lab 05/09/15 1522 05/10/15 0410  NA 134* 133*  K 5.6* 4.8  CL 103 102  CO2 24 21*  GLUCOSE 100* 80  BUN 24* 19  CREATININE 1.45* 1.23  CALCIUM 8.6* 8.5*   Liver Function Tests:  Recent Labs Lab 05/09/15 1620  AST 13*  ALT 11*  ALKPHOS 79  BILITOT 0.8  PROT 7.1  ALBUMIN 3.5   No results for input(s): LIPASE, AMYLASE in the last 168 hours. No results for input(s): AMMONIA in the last 168 hours. CBC:  Recent Labs Lab 05/09/15 1522 05/10/15 0410  WBC 3.8* 4.1  HGB 11.7* 11.7*  HCT 34.5* 35.1*  MCV 93.0 93.1  PLT 144* 136*   Cardiac Enzymes:  Recent Labs Lab 05/09/15 1620 05/09/15 1852  CKTOTAL  --  63  TROPONINI <0.03  --    BNP (last 3 results) No results for input(s): BNP in the last 8760 hours.  ProBNP (last 3 results) No results for input(s): PROBNP in the last 8760 hours.  CBG:  Recent Labs Lab 05/30/15 1554 05/10/15 0810  GLUCAP 95 89    No results found for this or any previous visit (from the past 240 hour(s)).   Studies: Dg Knee 1-2 Views Left  05-30-2015   CLINICAL DATA:  81 year old male with bilateral knee pain.  EXAM: LEFT KNEE - 1-2 VIEW  COMPARISON:  Radiograph dated 07/11/2010  FINDINGS: There is no fracture or dislocation. A small chronic appearing bony fragment noted along the inferior aspect of the patella. There is mild cardia calcinosis of the menisci, more prominent involving the lateral meniscus which may be seen with CPPD or  osteoarthritis. Vascular calcifications noted in the posterior knee. The soft tissues are otherwise unremarkable.  IMPRESSION: No fracture or dislocation.  Mild chondrocalcinosis of the menisci.   Electronically Signed   By: Anner Crete M.D.   On: May 30, 2015 23:30   Dg Knee 1-2 Views Right  05-30-2015   CLINICAL DATA:  Bilateral knee and hip weakness  EXAM: RIGHT KNEE - 1-2 VIEW  COMPARISON:  None.  FINDINGS: There is chondrocalcinosis, particularly in the lateral compartment. There is no fracture. There is no dislocation. There is no bone lesion or bony destruction. There are prominent patellar enthesophytes.  IMPRESSION: No acute findings.  There is chondrocalcinosis.   Electronically Signed   By: Andreas Newport M.D.   On: 2015/05/30 23:29   Ct Head Wo Contrast  30-May-2015   CLINICAL DATA:  Altered mental status and gait disorder  EXAM: CT HEAD WITHOUT CONTRAST  TECHNIQUE: Contiguous axial images were obtained from the base of the skull through the vertex without intravenous contrast.  COMPARISON:  May 26, 2012  FINDINGS: Moderate diffuse atrophy is stable. There is no intracranial mass, hemorrhage, extra-axial fluid collection, or midline shift. There is mild small vessel disease in the centra semiovale bilaterally, stable. There is a prior small lacunar infarct in the left globus pallidus, stable. No acute infarct is apparent. No new gray-white compartment lesions are identified.  The bony calvarium appears intact.  The mastoid air cells are clear.  IMPRESSION: Small prior lacunar infarct in the left globus pallidus. Atrophy with mild periventricular small vessel disease. No acute infarct evident. No hemorrhage or mass effect.   Electronically Signed   By: Lowella Grip III M.D.   On: 05-30-15 16:05   Dg Hips Bilat With Pelvis 2v  May 30, 2015   CLINICAL DATA:  Bilateral hip weakness  EXAM: BILATERAL HIP (WITH PELVIS) 2 VIEWS  COMPARISON:  None.  FINDINGS: Pelvic ring is intact. No acute  fracture or dislocation is noted. Retained contrast in multiple colonic diverticula are seen. No gross soft tissue abnormality is seen.  IMPRESSION: No acute abnormality noted.   Electronically Signed   By: Inez Catalina M.D.   On: 2015-05-30 23:31    Scheduled Meds: . aspirin  81 mg Oral BID  . carvedilol  25 mg Oral BID WC  . clopidogrel  75 mg Oral Q1500  . ferrous sulfate  325 mg Oral TID WC  . heparin  5,000 Units Subcutaneous 3 times per day  . isosorbide mononitrate  120 mg Oral Daily  . isosorbide mononitrate  60 mg Oral QHS  . pantoprazole  40 mg Oral BID  . ranolazine  1,000 mg Oral BID  . simvastatin  10 mg Oral q1800  . sodium chloride  3 mL Intravenous Q12H   Continuous Infusions: . sodium chloride 1,000 mL (05/10/15 0809)   Antibiotics Given (last 72 hours)  None      Principal Problem:   Weakness Active Problems:   Essential hypertension   S/P CABG x 2 '89. RCA stent'04. last cath Jan 2013   PACEMAKER, PERMANENT- St Jude Sept 2009   History of iron deficiency anemia   Sinoatrial node dysfunction   PAF (paroxysmal atrial fibrillation)   Atherosclerosis of lower extremity with claudication   Hyperlipidemia LDL goal <70   Hyperkalemia   Acute on chronic kidney failure   Chronic diastolic CHF (congestive heart failure)    Time spent: 45min    Aslyn Cottman  [facility] Pager [phone]. If 7PM-7AM, please contact night-coverage at [email or web address], password [facility] 05/10/2015, 12:24 PM  LOS: 1 day

## 2015-05-10 NOTE — Progress Notes (Signed)
*  PRELIMINARY RESULTS* Echocardiogram 2D Echocardiogram has been performed.  Leavy Cella 05/10/2015, 12:29 PM

## 2015-05-10 NOTE — Evaluation (Signed)
Clinical/Bedside Swallow Evaluation Patient Details  Name: John Peck MRN: [id number] Date of Birth: 1924-01-23  Today's Date: 05/10/2015 Time: SLP Start Time (ACUTE ONLY): 0830 SLP Stop Time (ACUTE ONLY): 0845 SLP Time Calculation (min) (ACUTE ONLY): 15 min  Past Medical History:  Past Medical History  Diagnosis Date  . Hypertension   . Pacemaker Sept 2009    St Jude  . CAD (coronary artery disease)     CABG '89, PCI '04  . Intermediate coronary syndrome   . Arrhythmia     afib, bradycardia  . Diastolic dysfunction   . Anemia   . Sick sinus syndrome Sept 2009    ST Jude PTVDP  . History of COPD   . H/O: GI bleed     REMOTE HISTORY  . Hyperlipidemia   . CHF (congestive heart failure)     patient states he has a pacemaker  . PAF (paroxysmal atrial fibrillation)   . CKD (chronic kidney disease), stage II    Past Surgical History:  Past Surgical History  Procedure Laterality Date  . Coronary artery bypass graft  1989    had LIMA to his LAD, a vein to the circumflex. In 2004 underwent stenting to his right coronary artery.  . Cardiac catheterization  11/2011    EF 50%; significant native CAD w/80% stenosis of the LAD after 2nd giagonal vessel and septal perforating artery w/total occlusion of the mid left anterior descendiung.; 60-70% ostiasl stenosis in the circumflex vessel followed by 40% proximal stenosis and 70-80% distal circumflex stenosis;   . Cataract extraction  2004  . Prostatectomy    . Hemorrhoid surgery    . Insert / replace / remove pacemaker  07/17/08    DUAL-CHAMBER; PPM-ST.JUDE [employer]NET  . Coronary angioplasty with stent placement  2004    RCA  . Left heart catheterization with coronary/graft angiogram N/A 12/10/2011    Procedure: LEFT HEART CATHETERIZATION WITH Beatrix Fetters;  Surgeon: Troy Sine, MD;  Location: [facility] CATH LAB;  Service: Cardiovascular;  Laterality: N/A;  . Left heart catheterization with coronary/graft angiogram N/A 01/26/2014     Procedure: LEFT HEART CATHETERIZATION WITH Beatrix Fetters;  Surgeon: Troy Sine, MD;  Location: [facility] CATH LAB;  Service: Cardiovascular;  Laterality: N/A;   HPI:  Pt is an 81 y.o. male with PMH of HTN, pacemaker, HLD, GERD, CAD (s/p of CABG 1898 and stent 3716), diastolic congestive heart failure, anemia, COPD, history of GI bleeding, PAF not anticoagulate due to history of GI bleeding, chronic kidney disease-stage II, who presents with weakness and difficulty walking on 6/30. Pt reported having weakness and difficulty walking for past 3 weeks, progressively worsening the past 1-2 days. Head CT 6/30 revealed small remote lacunar infarct in L globus pallidus. Pt failed RN stroke swallow screen- lung sounds changed after screen- bedside swallow eval ordered further assess swallow function.   Assessment / Plan / Recommendation Clinical Impression  Pt demonstrated no overt s/s of aspiration at bedside, even when drinking large quantity from straw- swallow appeared timely, hyolaryngeal excursion felt mildly reduced upon palpation. No difficulties noted with liquids. No hx of PNA, pt currently afebrile. Educated pt on importance of taking small bites/ sips. Aspiration risk appears mild at this time- recommend initiating regular diet, thin liquids, meds whole with liquid or with puree if difficulties arise. May provide intermittent supervision to ensure pt is taking small bites/ sips. SLP will sign off at this time as no acute needs noted- please re-consult if needs  arise.    Aspiration Risk  Mild    Diet Recommendation Age appropriate regular solids;Thin   Medication Administration: Whole meds with liquid Compensations: Slow rate;Small sips/bites    Other  Recommendations Oral Care Recommendations: Oral care BID   Follow Up Recommendations       Frequency and Duration        Pertinent Vitals/Pain none    SLP Swallow Goals     Swallow Study Prior Functional Status        General Other Pertinent Information: Pt is an 81 y.o. male with PMH of HTN, pacemaker, HLD, GERD, CAD (s/p of CABG 1898 and stent 8592), diastolic congestive heart failure, anemia, COPD, history of GI bleeding, PAF not anticoagulate due to history of GI bleeding, chronic kidney disease-stage II, who presents with weakness and difficulty walking on 6/30. Pt reported having weakness and difficulty walking for past 3 weeks, progressively worsening the past 1-2 days. Head CT 6/30 revealed small remote lacunar infarct in L globus pallidus. Pt failed RN stroke swallow screen- lung sounds changed after screen- bedside swallow eval ordered further assess swallow function. Type of Study: Bedside swallow evaluation Diet Prior to this Study: NPO Temperature Spikes Noted: No Respiratory Status: Room air History of Recent Intubation: No Behavior/Cognition: Cooperative;Alert;Pleasant mood Oral Cavity - Dentition: Adequate natural dentition/normal for age Self-Feeding Abilities: Able to feed self Patient Positioning: Upright in bed Baseline Vocal Quality: Normal Volitional Cough: Strong Volitional Swallow: Unable to elicit    Oral/Motor/Sensory Function Overall Oral Motor/Sensory Function: Appears within functional limits for tasks assessed Labial ROM: Within Functional Limits Labial Symmetry: Within Functional Limits Labial Strength: Within Functional Limits Labial Sensation: Within Functional Limits Lingual ROM: Within Functional Limits Lingual Symmetry: Within Functional Limits Lingual Strength: Within Functional Limits Lingual Sensation: Within Functional Limits Mandible: Within Functional Limits   Ice Chips Ice chips: Not tested   Thin Liquid Thin Liquid: Within functional limits Presentation: Straw    Nectar Thick Nectar Thick Liquid: Not tested   Honey Thick Honey Thick Liquid: Not tested   Puree Puree: Within functional limits Presentation: Self Fed;Spoon   Solid   GO    Solid: Within  functional limits Presentation: [place], [place], [place], CCC-SLP 05/10/2015,8:55 AM  [phone]

## 2015-05-10 NOTE — Evaluation (Signed)
Physical Therapy Evaluation Patient Details Name: John Peck MRN: [id number] DOB: [birth year] Today's Date: 05/10/2015   History of Present Illness  Patient is an 81 yo male admitted 05/09/15 with weakness and difficulty walking.   PMH:  HTN, CAD, CABG, pacemaker, PAF, CKD, CHF, COPD  Clinical Impression  Patient presents with problems listed below.  Will benefit from acute PT to maximize functional mobility prior to return home with wife.  Patient did well with ambulation with RW.  Encouraged patient to use RW at home for safety.  Will need to do stair training at next session.    Follow Up Recommendations Home health PT;Supervision/Assistance - 24 hour    Equipment Recommendations  None recommended by PT    Recommendations for Other Services       Precautions / Restrictions Precautions Precautions: Fall Restrictions Weight Bearing Restrictions: No      Mobility  Bed Mobility Overal bed mobility: Needs Assistance Bed Mobility: Supine to Sit;Sit to Supine     Supine to sit: Min guard Sit to supine: Min guard   General bed mobility comments: No physical assist needed.  Min guard for safety  Transfers Overall transfer level: Needs assistance Equipment used: Rolling walker (2 wheeled) Transfers: Sit to/from Stand Sit to Stand: Min guard         General transfer comment: Verbal cues for hand placement.  Assist for safety only.  Ambulation/Gait Ambulation/Gait assistance: Min guard Ambulation Distance (Feet): 200 Feet Assistive device: Rolling walker (2 wheeled) Gait Pattern/deviations: Step-through pattern;Decreased stride length;Trunk flexed Gait velocity: Decreased Gait velocity interpretation: Below normal speed for age/gender General Gait Details: Verbal cues for safe use of RW.  Difficulty maneuvering RW in turns.  Cues to stand upright during gait.  Patient with flexed posture.  Stairs            Wheelchair Mobility    Modified Rankin (Stroke  Patients Only)       Balance                                             Pertinent Vitals/Pain Pain Assessment: No/denies pain    Home Living Family/patient expects to be discharged to:: Private residence Living Arrangements: Spouse/significant other Available Help at Discharge: Family;Available 24 hours/day Type of Home: House       Home Layout: Multi-level (Split level) Home Equipment: Walker - 2 wheels;Walker - 4 wheels;Cane - single point;Wheelchair - manual      Prior Function Level of Independence: Independent with assistive device(s);Needs assistance   Gait / Transfers Assistance Needed: Patient using cane for ambulation  ADL's / Homemaking Assistance Needed: Wife assists patient with bathing and dressing.  Wife does meal prep and housekeeping        Hand Dominance   Dominant Hand: Right    Extremity/Trunk Assessment   Upper Extremity Assessment: Overall WFL for tasks assessed           Lower Extremity Assessment: Generalized weakness (Per chart, h/o pain in hips/knees.)         Communication   Communication: No difficulties  Cognition Arousal/Alertness: Awake/alert Behavior During Therapy: WFL for tasks assessed/performed Overall Cognitive Status: Within Functional Limits for tasks assessed                      General Comments      Exercises  Assessment/Plan    PT Assessment Patient needs continued PT services  PT Diagnosis Difficulty walking;Generalized weakness   PT Problem List Decreased strength;Decreased activity tolerance;Decreased balance;Decreased mobility  PT Treatment Interventions DME instruction;Gait training;Functional mobility training;Stair training;Therapeutic activities;Patient/family education   PT Goals (Current goals can be found in the Care Plan section) Acute Rehab PT Goals Patient Stated Goal: To return home PT Goal Formulation: With patient Time For Goal Achievement:  05/17/15 Potential to Achieve Goals: Good    Frequency Min 3X/week   Barriers to discharge Inaccessible home environment Split level home with 6 steps to go up and down.    Co-evaluation               End of Session Equipment Utilized During Treatment: Gait belt Activity Tolerance: Patient tolerated treatment well Patient left: in bed;with call bell/phone within reach;with bed alarm set           Time: 5003-7048 PT Time Calculation (min) (ACUTE ONLY): 18 min   Charges:   PT Evaluation $Initial PT Evaluation Tier I: 1 Procedure     PT G CodesDespina Pole June 03, 2015, 5:25 PM Carita Pian. Sanjuana Kava, [place] Pager [phone]

## 2015-05-10 NOTE — Progress Notes (Signed)
Orthostatic VS completed. 

## 2015-05-11 DIAGNOSIS — I480 Paroxysmal atrial fibrillation: Secondary | ICD-10-CM

## 2015-05-11 DIAGNOSIS — Z951 Presence of aortocoronary bypass graft: Secondary | ICD-10-CM

## 2015-05-11 LAB — GLUCOSE, CAPILLARY: GLUCOSE-CAPILLARY: 90 mg/dL (ref 65–99)

## 2015-05-11 LAB — UREA NITROGEN, URINE: UREA NITROGEN UR: 242 mg/dL

## 2015-05-11 MED ORDER — ISOSORBIDE MONONITRATE ER 60 MG PO TB24: ORAL_TABLET | ORAL | Status: DC

## 2015-05-11 NOTE — Progress Notes (Signed)
Physical Therapy Treatment Patient Details Name: John Peck MRN: [id number] DOB: 14-May-1924 Today's Date: 05/11/2015    History of Present Illness Patient is an 81 yo male admitted 05/09/15 with weakness and difficulty walking.   PMH:  HTN, CAD, CABG, pacemaker, PAF, CKD, CHF, COPD    PT Comments    Patient did well with ambulation and stairs today.  Daughter and wife present for session.  Daughter instructed on assisting patient on stairs (wife declined to come to stairs for instruction).  Able to negotiate 6 stairs x2 with min guard assist.  Patient ready for discharge from PT perspective.  Will have HHPT follow-up.   Follow Up Recommendations  Home health PT;Supervision/Assistance - 24 hour     Equipment Recommendations  None recommended by PT    Recommendations for Other Services       Precautions / Restrictions Precautions Precautions: Fall Restrictions Weight Bearing Restrictions: No    Mobility  Bed Mobility                  Transfers Overall transfer level: Needs assistance Equipment used: Rolling walker (2 wheeled) Transfers: Sit to/from Stand Sit to Stand: Supervision         General transfer comment: Verbal cues for hand placement.  Assist for safety only.  Ambulation/Gait Ambulation/Gait assistance: Supervision Ambulation Distance (Feet): 220 Feet Assistive device: Rolling walker (2 wheeled) Gait Pattern/deviations: Step-through pattern;Decreased stride length;Trunk flexed Gait velocity: Decreased Gait velocity interpretation: Below normal speed for age/gender General Gait Details: Patient demonstrates safe use of RW.  Cues to stand upright during gait.   Stairs Stairs: Yes Stairs assistance: Min guard Stair Management: One rail Left;Alternating pattern;Step to pattern;Forwards Number of Stairs: 12 (6 stairs x2) General stair comments: Instructed patient to negotiate stairs using 1 rail and step-to pattern.  Patient using alternating  pattern going up and step-to going down.  Instructed daughter on assisting patient safely.  Wheelchair Mobility    Modified Rankin (Stroke Patients Only)       Balance                                    Cognition Arousal/Alertness: Awake/alert Behavior During Therapy: WFL for tasks assessed/performed Overall Cognitive Status: Within Functional Limits for tasks assessed                      Exercises      General Comments        Pertinent Vitals/Pain Pain Assessment: No/denies pain    Home Living                      Prior Function            PT Goals (current goals can now be found in the care plan section) Progress towards PT goals: Progressing toward goals    Frequency  Min 3X/week    PT Plan Current plan remains appropriate    Co-evaluation             End of Session Equipment Utilized During Treatment: Gait belt Activity Tolerance: Patient tolerated treatment well Patient left: in chair;with call bell/phone within reach;with family/visitor present     Time: 2409-7353 PT Time Calculation (min) (ACUTE ONLY): 15 min  Charges:  $Gait Training: 8-22 mins  G Codes:      Despina Pole 05/11/2015, 4:13 PM Carita Pian. Sanjuana Kava, [place] Pager [phone]

## 2015-05-11 NOTE — Progress Notes (Signed)
Patient discharge teaching given, including activity, diet, follow-up appoints, and medications. Patient verbalized understanding of all discharge instructions. IV access was d/c'd. Vitals are stable. Skin is intact except as charted in most recent assessments. Pt to be escorted out by NT, to be driven home by family. 

## 2015-05-12 NOTE — Discharge Summary (Signed)
Physician Discharge Summary  COLLIE WERNICK OFB:[id number] DOB: 23-Feb-1924 DOA: 05/09/2015  PCP: Patricia Nettle, MD  Admit date: 05/09/2015 Discharge date: 05/11/2015  Time spent: 35 minutes  Recommendations for Outpatient Follow-up:  1. PCP Dr.Schoffstall in 1 week, Alpha blocker Uroxatral stopped and Imdur dose decreased 2. 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 RCA stent'04. last cath Jan 2013   PACEMAKER, PERMANENT- St Jude Sept 2009   History of iron deficiency anemia   Sinoatrial node dysfunction   PAF (paroxysmal atrial fibrillation)   Atherosclerosis of lower extremity with claudication   Hyperlipidemia LDL goal <70   Hyperkalemia   Acute on chronic kidney failure   Chronic diastolic CHF (congestive heart failure)   Discharge Condition:stable  Diet recommendation: low sodium  Filed Weights   05/09/15 2131 05/10/15 0424 05/11/15 0533  Weight: 77.8 kg (171 lb 8.3 oz) 77.4 kg (170 lb 10.2 oz) 78.019 kg (172 lb)    History of present illness:  Chief Complaint: weakness and difficult walking.  HPI: John Peck is a 81 y.o. male with PMH of HTN, pacemaker, HLD, GERD, CAD (s/p of CABG 1898 and stent 7824), diastolic congestive heart failure, anemia, COPD, history of GI bleeding, PAF not anticoagulated due to history of GI bleeding, chronic kidney disease-stage II, who presented with weakness and difficulty walking. Patient reports that has been having weakness and difficult waling in the past 3 weeks, which has been progressively worsening in the past one or two days. He states that he has pain in both hips and knees, causing him difficulty walking.  In ED, patient was found to have WBC 3.8, INR 1.22, PTT 30, negative troponin, negative urinalysis, temperature normal, bradycardia, potassium 5.6, AoCKD-II. 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   Hospital Course:  Generalized weakness -suspect multifactorial, age, deconditioning, dehydration, BP meds, on high dose nitrates, alpha blocker etc -was borderline Orthostatic on admission and was recently started on Uroxatral as an Alpha Blocker by his Urologist this was stopped, he did not have difficulties with Urination the following 24hours -CT without acute findings, remote left globus pallidus infarct. -Neurology consulted and this was felt to be multifactorial -No events on tele, 2 D ECHO with EF of 55% to 60%,  Basal inferior hypokinesis noted which is unchanged compared to cath in 3/15, and grade 1 diastolic dysfunction. -TSH, B12 within normal limits - i have cut down his imdur dose too from 120mg  qam and 60 mg qpm to 60mg  in am and 30mg  in pm  Hyperkalemia: K=5.6 without EKG change. - Likely iatrogenic and worsening renal Fx. Patient is on potassium supplements at home. - resolved, given kayexalate -since he is on very low dose KCL at home with lasix this was resumed, needs Bmet repeated in 1 week  Essential hypertension: - Coreg, BP soft, will cut down imdur dose in half  CAD: S/P CABG x 2 '89 and stent 2004. No chest pain now - Aspirin, Plavix, Imdur, prn Nitroglycerin, Coreg,  PAF and Sinoatrial node dysfunction: CHA2DS2-VASc Score is 5  - Patient is not on any anti-coagulatants due to history of GIB -continue coreg -has PACEMAKER, PERMANENT- St Jude Sept 2009  Iron deficiency anemia: hgb 11.7. -continue ferrous sulfate  HLD: LDL was 131 on 03/28/15. -continue Zocor, may increase dose from 10 to 20 mg daily if CK  comes back normal  AoCKD-III: Baseline Cre is 1.2, his Cre is 1.45 on admission. Likely due to prerenal secondary to dehydration and continuation of diuretics. - improved back to baseline, lasix was held initially and then resumed  Chronic diastolic CHF: 2-D echo on 05/19/12 showed EF 55%. Patient is on low-dose Lasix at home. CHF is  compensated on admission.  -lasix resume, and KCL    Consultations:  Neurology  Discharge Exam: Filed Vitals:   05/11/15 0825  BP: 149/67  Pulse: 60  Temp:   Resp:     General: AAOx3 Cardiovascular: S1S2/RRR Respiratory: CTAB  Discharge Instructions   Discharge Instructions    Diet - low sodium heart healthy    Complete by:  As directed      Discharge instructions    Complete by:  As directed   Please Stop Uroxatral , the medicine started by Dr.Nesi     Increase activity slowly    Complete by:  As directed           Discharge Medication List as of 05/11/2015 12:49 PM    CONTINUE these medications which have CHANGED   Details  isosorbide mononitrate (IMDUR) 60 MG 24 hr tablet Take 1 tablet (60 mg total) by mouth every morning and 1/2 tablet (30 mg total) by mouth in the evening., No Print      CONTINUE these medications which have NOT CHANGED   Details  aspirin 81 MG tablet Take 81 mg by mouth 2 (two) times daily. , Until Discontinued, Historical Med    carvedilol (COREG) 25 MG tablet Take 25 mg by mouth 2 (two) times daily with a meal.  , Until Discontinued, Historical Med    clopidogrel (PLAVIX) 75 MG tablet Take 75 mg by mouth daily at 3 pm. , Until Discontinued, Historical Med    ferrous sulfate 325 (65 FE) MG tablet Take 325 mg by mouth 3 (three) times daily with meals. , Until Discontinued, Historical Med    furosemide (LASIX) 20 MG tablet Take 20 mg by mouth daily.  , Until Discontinued, Historical Med    lubiprostone (AMITIZA) 24 MCG capsule Take 24 mcg by mouth daily as needed. For bowel movement, Until Discontinued, Historical Med    Menthol, Topical Analgesic, (ICY HOT EX) Apply 1 application topically daily as needed (aches and pains)., Until Discontinued, Historical Med    nitroGLYCERIN (NITROSTAT) 0.4 MG SL tablet Place 1 tablet (0.4 mg total) under the tongue as needed., Starting 01/19/2014, Until Discontinued, Normal    pantoprazole (PROTONIX)  40 MG tablet Take 40 mg by mouth 2 (two) times daily., Until Discontinued, Historical Med    potassium citrate (UROCIT-K) 10 MEQ (1080 MG) SR tablet Take 10 mEq by mouth every morning., Starting 09/24/2014, Until Discontinued, Historical Med    ranolazine (RANEXA) 1000 MG SR tablet Take 1 tablet (1,000 mg total) by mouth 2 (two) times daily., Starting 10/15/2014, Until Discontinued, Print    simvastatin (ZOCOR) 10 MG tablet Take 10 mg by mouth daily at 6 PM., Until Discontinued, Historical Med    tamsulosin (FLOMAX) 0.4 MG CAPS capsule Take 0.4 mg by mouth daily., Starting 02/26/2015, Until Discontinued, Historical Med    atorvastatin (LIPITOR) 40 MG tablet Take 1 tablet (40 mg total) by mouth daily., Starting 04/04/2015, Until Discontinued, Fill Later       No Known Allergies Follow-up Information    Follow up with Patricia Nettle, MD. Schedule an appointment as soon as possible for a visit in 1 week.  Specialty:  Cardiology   Contact information:   [place] [address] [phone]       Follow up with [facility].   Why:  physical and occupational therapy, nurse and aide   Contact information:   [address] [place] [address] [phone]        The results of significant diagnostics from this hospitalization (including imaging, microbiology, ancillary and laboratory) are listed below for reference.    Significant Diagnostic Studies: Dg Knee 1-2 Views Left  Jun 02, 2015   CLINICAL DATA:  81 year old male with bilateral knee pain.  EXAM: LEFT KNEE - 1-2 VIEW  COMPARISON:  Radiograph dated 07/11/2010  FINDINGS: There is no fracture or dislocation. A small chronic appearing bony fragment noted along the inferior aspect of the patella. There is mild cardia calcinosis of the menisci, more prominent involving the lateral meniscus which may be seen with CPPD or osteoarthritis. Vascular calcifications noted in the posterior knee. The  soft tissues are otherwise unremarkable.  IMPRESSION: No fracture or dislocation.  Mild chondrocalcinosis of the menisci.   Electronically Signed   By: Anner Crete M.D.   On: 06-02-15 23:30   Dg Knee 1-2 Views Right  Jun 02, 2015   CLINICAL DATA:  Bilateral knee and hip weakness  EXAM: RIGHT KNEE - 1-2 VIEW  COMPARISON:  None.  FINDINGS: There is chondrocalcinosis, particularly in the lateral compartment. There is no fracture. There is no dislocation. There is no bone lesion or bony destruction. There are prominent patellar enthesophytes.  IMPRESSION: No acute findings.  There is chondrocalcinosis.   Electronically Signed   By: Andreas Newport M.D.   On: Jun 02, 2015 23:29   Ct Head Wo Contrast  2015/06/02   CLINICAL DATA:  Altered mental status and gait disorder  EXAM: CT HEAD WITHOUT CONTRAST  TECHNIQUE: Contiguous axial images were obtained from the base of the skull through the vertex without intravenous contrast.  COMPARISON:  May 26, 2012  FINDINGS: Moderate diffuse atrophy is stable. There is no intracranial mass, hemorrhage, extra-axial fluid collection, or midline shift. There is mild small vessel disease in the centra semiovale bilaterally, stable. There is a prior small lacunar infarct in the left globus pallidus, stable. No acute infarct is apparent. No new gray-white compartment lesions are identified.  The bony calvarium appears intact.  The mastoid air cells are clear.  IMPRESSION: Small prior lacunar infarct in the left globus pallidus. Atrophy with mild periventricular small vessel disease. No acute infarct evident. No hemorrhage or mass effect.   Electronically Signed   By: Lowella Grip III M.D.   On: Jun 02, 2015 16:05   Dg Esophagus  04/17/2015   CLINICAL DATA:  Dysphagia  EXAM: ESOPHOGRAM/BARIUM SWALLOW  TECHNIQUE: Single contrast examination was performed using  thin barium.  FLUOROSCOPY TIME:  Radiation Exposure Index (as provided by the fluoroscopic device): 63 Gy per sq cm   If the device does not provide the exposure index:  Fluoroscopy Time:  2 minutes 0 seconds  Number of Acquired Images:  COMPARISON:  None.  FINDINGS: A single contrast barium swallow was performed. The patient could not raise his chin, and the assessment of the cervical esophagus is limited. However the swallowing mechanism is unremarkable. No aspiration is seen. There are is significant tertiary contractions in the mid and distal esophagus. There may be a tiny sliding hiatal hernia present. No gastroesophageal reflux could be demonstrated. A barium pill did lodge within the distal esophagus, but after another  swallow of barium was given the pill did pass into the stomach before dissolving.  IMPRESSION: 1. Significant tertiary contractions in the mid and distal esophagus. 2. Probable tiny sliding hiatal hernia. Barium pill does delay in passing, but does pass into the stomach intact.   Electronically Signed   By: Ivar Drape M.D.   On: October 06, 202016 10:41   Dg Hips Bilat With Pelvis 2v  05/09/2015   CLINICAL DATA:  Bilateral hip weakness  EXAM: BILATERAL HIP (WITH PELVIS) 2 VIEWS  COMPARISON:  None.  FINDINGS: Pelvic ring is intact. No acute fracture or dislocation is noted. Retained contrast in multiple colonic diverticula are seen. No gross soft tissue abnormality is seen.  IMPRESSION: No acute abnormality noted.   Electronically Signed   By: Inez Catalina M.D.   On: 05/09/2015 23:31    Microbiology: No results found for this or any previous visit (from the past 240 hour(s)).   Labs: Basic Metabolic Panel:  Recent Labs Lab 05/09/15 1522 05/10/15 0410  NA 134* 133*  K 5.6* 4.8  CL 103 102  CO2 24 21*  GLUCOSE 100* 80  BUN 24* 19  CREATININE 1.45* 1.23  CALCIUM 8.6* 8.5*   Liver Function Tests:  Recent Labs Lab 05/09/15 1620  AST 13*  ALT 11*  ALKPHOS 79  BILITOT 0.8  PROT 7.1  ALBUMIN 3.5   No results for input(s): LIPASE, AMYLASE in the last 168 hours. No results for input(s):  AMMONIA in the last 168 hours. CBC:  Recent Labs Lab 05/09/15 1522 05/10/15 0410  WBC 3.8* 4.1  HGB 11.7* 11.7*  HCT 34.5* 35.1*  MCV 93.0 93.1  PLT 144* 136*   Cardiac Enzymes:  Recent Labs Lab 05/09/15 1620 05/09/15 1852 05/10/15 1335 05/10/15 1841  CKTOTAL  --  63  --   --   TROPONINI <0.03  --  <0.03 <0.03   BNP: BNP (last 3 results) No results for input(s): BNP in the last 8760 hours.  ProBNP (last 3 results) No results for input(s): PROBNP in the last 8760 hours.  CBG:  Recent Labs Lab 05/09/15 1554 05/10/15 0810 05/11/15 0737  GLUCAP 95 89 90       Signed:  Nejla Reasor  [facility] 05/12/2015, 5:48 PM

## 2015-05-12 NOTE — Care Management Note (Signed)
Case Management Note  Patient Details  Name: John Peck MRN: [id number] Date of Birth: [birth year]  Subjective/Objective:                   weakness and difficult walking. Action/Plan:  Discharge planning Expected Discharge Date:  05/11/15               Expected Discharge Plan:  [place]  In-House Referral:     Discharge planning Services  CM Consult  Post Acute Care Choice:    Choice offered to:  Patient, Spouse, Adult Children  DME Arranged:    DME Agency:     HH Arranged:  RN, PT, OT [place] Agency:  [facility]  Status of Service:  Completed, signed off  Medicare Important Message Given:    Date Medicare IM Given:    Medicare IM give by:    Date Additional Medicare IM Given:    Additional Medicare Important Message give by:     If discussed at [place] of Stay Meetings, dates discussed:    Additional Comments: LATE ENTRY: Cm met with pt and family in room as RN states previous CM did not make [place] services clear.  CM had a lengthy conversation with the family and family verbalized understanding of the services.  Address and contact information verified with family.  Referral confirmed with [facility] rep, Tiffany for HHPT/OT/Rn/aide.  No other Cm needs were communicated. Dellie Catholic, RN 05/12/2015, 8:04 AM

## 2015-05-13 ENCOUNTER — Other Ambulatory Visit: Payer: Self-pay | Admitting: Cardiovascular Disease

## 2015-05-14 NOTE — Telephone Encounter (Signed)
REFILL 

## 2015-06-15 ENCOUNTER — Other Ambulatory Visit: Payer: Self-pay | Admitting: Cardiovascular Disease

## 2015-06-17 NOTE — Telephone Encounter (Signed)
Rx(s) sent to pharmacy electronically.  

## 2015-07-03 DIAGNOSIS — I480 Paroxysmal atrial fibrillation: Secondary | ICD-10-CM | POA: Diagnosis not present

## 2015-07-15 ENCOUNTER — Other Ambulatory Visit: Payer: Self-pay | Admitting: Cardiovascular Disease

## 2015-09-02 ENCOUNTER — Ambulatory Visit (INDEPENDENT_AMBULATORY_CARE_PROVIDER_SITE_OTHER): Payer: Medicare Other | Admitting: Cardiovascular Disease

## 2015-09-02 ENCOUNTER — Encounter: Payer: Self-pay | Admitting: Cardiovascular Disease

## 2015-09-02 VITALS — BP 126/60 | HR 60 | Ht 66.0 in | Wt 173.0 lb

## 2015-09-02 DIAGNOSIS — I10 Essential (primary) hypertension: Secondary | ICD-10-CM | POA: Diagnosis not present

## 2015-09-02 DIAGNOSIS — Z951 Presence of aortocoronary bypass graft: Secondary | ICD-10-CM

## 2015-09-02 DIAGNOSIS — Z79899 Other long term (current) drug therapy: Secondary | ICD-10-CM | POA: Diagnosis not present

## 2015-09-02 DIAGNOSIS — I2510 Atherosclerotic heart disease of native coronary artery without angina pectoris: Secondary | ICD-10-CM

## 2015-09-02 DIAGNOSIS — E785 Hyperlipidemia, unspecified: Secondary | ICD-10-CM

## 2015-09-02 DIAGNOSIS — I480 Paroxysmal atrial fibrillation: Secondary | ICD-10-CM

## 2015-09-02 DIAGNOSIS — Z862 Personal history of diseases of the blood and blood-forming organs and certain disorders involving the immune mechanism: Secondary | ICD-10-CM

## 2015-09-02 MED ORDER — RANOLAZINE ER 1000 MG PO TB12
500.0000 mg | ORAL_TABLET | Freq: Two times a day (BID) | ORAL | Status: DC
Start: 2015-09-02 — End: 2015-11-27

## 2015-09-02 MED ORDER — ATORVASTATIN CALCIUM 20 MG PO TABS: 20.0000 mg | ORAL_TABLET | Freq: Every day | ORAL | Status: DC

## 2015-09-02 NOTE — Patient Instructions (Signed)
Your physician has recommended you make the following change in your medication: the simvastatin has been changed to atorvastatin and has been sent to your pharmacy.  Your physician recommends that you return for lab work fasting.  Your physician wants you to follow-up in: 6 months or sooner if needed. You will receive a reminder letter in the mail two months in advance. If you don't receive a letter, please call our office to schedule the follow-up appointment.  If you need a refill on your cardiac medications before your next appointment, please call your pharmacy.

## 2015-09-03 ENCOUNTER — Encounter: Payer: Self-pay | Admitting: Cardiovascular Disease

## 2015-09-03 DIAGNOSIS — I2510 Atherosclerotic heart disease of native coronary artery without angina pectoris: Secondary | ICD-10-CM | POA: Insufficient documentation

## 2015-09-03 NOTE — Progress Notes (Signed)
Patient ID: John Peck, male   DOB: November 18, 1923, 81 y.o.   MRN: [id number]      HPI: John Peck is a 81 y.o. male who presents to the office today for a 6 month follow-up cardiology evaluation.  John Peck has known CAD and underwent CABG surgery with LIMA to the LAD, a vein to the circumflex in 1989.  In 2004 he underwent stenting to his RCA. Cardiac catheterization in January 2013 showed an ejection fraction of 50% with mild inferior, distal inferior and apical lateral hypocontractility. He had significant native CAD with 80% stenosis of the LAD after the second diagonal vessel and septal perforator artery with total occlusion of the mid LAD. He was 34 - 70% ostial stenosis in the circumflex vessel followed by 40% proximal stenosis and 7080% distal stenosis. He had an occluded Y graft which previously sequentially supplied the intermediate and distal circumflex.There was a patent stented proximal RCA with 90% stenosis in the anterior RV marginal branch arising from the mid RCA and had 70-80% stenosis in the distal RCA beyond the crux with 78% stenosis the PDA takeoff. His LIMA to LAD was patent. He has been on medical therapy. He has a history of atrial fibrillation, sick sinus syndrome and status post permanent pacemaker in 2009 which is a St. Jude device. Additional problems include iron deficiency anemia, hypertension, BPH, status post TURP in 1992, remote history of liver abscess, history of COPD.  In March 2015 due to recurrent increasing symptoms of chest pain he underwent repeat cardiac catheterization which revealed mild LV dysfunction with an EF of 45% inferior wall hypocontractility. There was significant multivessel native coronary obstructive disease with 50% ostial LAD stenosis followed by 80% proximal stenosis in the region of the first diagonal takeoff followed by 80% stenosis after the second diagonal vessel; total occlusion of the ramus intermediate vessel with evidence for  bidirectional retrograde collateralization; 40% ostial left circumflex stenosis; and widely patent proximal RCA stent with 80% stenosis in the anterior RV marginal branch, 40% mid or CVA stenosis, 50% distal, and 50% ostial and mid PDA stenoses. He had a atent LIMA graft supplying the mid LAD and an occluded Y graft which previously supplied the intermediate and distal circumflex.  He has been on increased medical regimen consisting of aspirin, Plavix, carvedilol 25 mg twice a day, high dose oral nitrate therapy, Ranexa 1000 g twice a day.  Amlodipine 10 mg in addition to Crestor 20 mg.  He also is taking Lasix 20 mg with supplemental potassium.  He was seen in the office by John Peck and admitted to bilateral lower extremity pain and weakness.  His blood pressure was somewhat low and his amlodipine dose was reduced from 10 to 5 mg. Lower extremity Doppler studies showed an ABI of 0.5 on the right and 0.58 on the left.  He had mild dilatation of his distal abdominal aorta.  There was equal to less than 50% diameter reduction.  The right SFA and right popliteal artery and there was a greater than 60% diameter reduction in the left SFA.  However, there was one vessel runoff via the peroneal artery bilaterally.  His symptoms are worse in the right lower extremity due to his distal disease.  He has  had done well with his increased medical regimen including Ranexa, high-dose nitrate therapy, amlodipine and carvedilol.  A follow-up Doppler study in August 2015 showed ABI of 0.5 on the right and 0.58 on the left.  He has  poor distal runoff.  His Doppler studies were reviewed with John Peck who agreed with medical therapy.  He already was on aspirin and Plavix  Over the past year, he is not experience any significant chest pain.  Due to somewhat low blood pressure his amlodipine had been discontinued..  He has difficulty walking and walks with a cane.  He is followed by John Peck and is felt to have normal  pacemaker function.  When I last saw him, I had suggested that he change his simvastatin to atorvastatin for more aggressive lipid-lowering but he did not do this.  He presents for evaluation.  Past Medical History  Diagnosis Date  . Hypertension   . Pacemaker Sept 2009    St Jude  . CAD (coronary artery disease)     CABG '89, PCI '04  . Intermediate coronary syndrome ([place])   . Arrhythmia     afib, bradycardia  . Diastolic dysfunction   . Anemia   . Sick sinus syndrome [facility]) Sept 2009    ST Jude PTVDP  . History of COPD   . H/O: GI bleed     REMOTE HISTORY  . Hyperlipidemia   . CHF (congestive heart failure) ([place])     patient states he has a pacemaker  . PAF (paroxysmal atrial fibrillation) ([place])   . CKD (chronic kidney disease), stage II     Past Surgical History  Procedure Laterality Date  . Coronary artery bypass graft  1989    had LIMA to his LAD, a vein to the circumflex. In 2004 underwent stenting to his right coronary artery.  . Cardiac catheterization  11/2011    EF 50%; significant native CAD w/80% stenosis of the LAD after 2nd giagonal vessel and septal perforating artery w/total occlusion of the mid left anterior descendiung.; 60-70% ostiasl stenosis in the circumflex vessel followed by 40% proximal stenosis and 70-80% distal circumflex stenosis;   . Cataract extraction  2004  . Prostatectomy    . Hemorrhoid surgery    . Insert / replace / remove pacemaker  07/17/08    DUAL-CHAMBER; [email or web address].[email or web address] MEDNET  . Coronary angioplasty with stent placement  2004    RCA  . Left heart catheterization with coronary/graft angiogram N/A 12/10/2011    Procedure: LEFT HEART CATHETERIZATION WITH Beatrix Fetters;  Surgeon: Troy Sine, MD;  Location: [facility] CATH LAB;  Service: Cardiovascular;  Laterality: N/A;  . Left heart catheterization with coronary/graft angiogram N/A 01/26/2014    Procedure: LEFT HEART CATHETERIZATION WITH Beatrix Fetters;  Surgeon: Troy Sine,  MD;  Location: [facility] CATH LAB;  Service: Cardiovascular;  Laterality: N/A;    No Known Allergies  Current Outpatient Prescriptions  Medication Sig Dispense Refill  . aspirin 81 MG tablet Take 81 mg by mouth daily.     . carvedilol (COREG) 25 MG tablet Take 25 mg by mouth 2 (two) times daily with a meal.      . clopidogrel (PLAVIX) 75 MG tablet Take 75 mg by mouth daily at 3 pm.     . ferrous sulfate 325 (65 FE) MG tablet Take 325 mg by mouth 3 (three) times daily with meals.     . furosemide (LASIX) 20 MG tablet Take 20 mg by mouth daily.      . isosorbide mononitrate (IMDUR) 60 MG 24 hr tablet Take 1 tablet (60 mg total) by mouth every morning and 1/2 tablet (30 mg total) by mouth in the evening.    . lubiprostone (AMITIZA) 24  MCG capsule Take 24 mcg by mouth daily as needed. For bowel movement    . Menthol, Topical Analgesic, (ICY HOT EX) Apply 1 application topically daily as needed (aches and pains).    Marland Kitchen NITROSTAT 0.4 MG SL tablet PLACE 1 TABLET UNDER THE TONGUE AS NEEDED. 25 tablet 4  . pantoprazole (PROTONIX) 40 MG tablet Take 40 mg by mouth 2 (two) times daily.    . ranolazine (RANEXA) 1000 MG SR tablet Take 1 tablet (1,000 mg total) by mouth 2 (two) times daily. 180 tablet 3  . tamsulosin (FLOMAX) 0.4 MG CAPS capsule Take 0.4 mg by mouth daily.  2  . atorvastatin (LIPITOR) 20 MG tablet Take 1 tablet (20 mg total) by mouth daily. 90 tablet 3  . ranolazine (RANEXA) 1000 MG SR tablet Take 1 tablet (1,000 mg total) by mouth 2 (two) times daily. 84 tablet 0   No current facility-administered medications for this visit.    Social History   Social History  . Marital Status: Married    Spouse Name: N/A  . Number of Children: 5  . Years of Education: N/A   Occupational History  . RETIRED     TRUCK DRIVER   Social History Main Topics  . Smoking status: Former Smoker -- 2.00 packs/day for 65 years    Types: Cigarettes  . Smokeless tobacco: Former Systems developer    Quit date: 10/29/2008  .  Alcohol Use: No  . Drug Use: No  . Sexual Activity: Not on file   Other Topics Concern  . Not on file   Social History Narrative    Family History  Problem Relation Age of Onset  . Diabetes Father   . Heart disease Sister   . Heart disease Brother     ROS General: Negative; No fevers, chills, or night sweats;  HEENT: Negative; No changes in vision or hearing, sinus congestion, difficulty swallowing Pulmonary: Negative; No cough, wheezing, shortness of breath, hemoptysis Cardiovascular: See history of present illness  Positive for claudication, right lower extremity greater than left. GI: Negative; No nausea, vomiting, diarrhea, or abdominal pain GU: Negative; No dysuria, hematuria, or difficulty voiding Musculoskeletal: Negative; no myalgias, joint pain, or weakness Hematologic/Oncology: Negative; no easy bruising, bleeding Endocrine: Negative; no heat/cold intolerance; no diabetes Neuro: Negative; no changes in balance, headaches Skin: Negative; No rashes or skin lesions Psychiatric: Negative; No behavioral problems, depression Sleep: Negative; No snoring, daytime sleepiness, hypersomnolence, bruxism, restless legs, hypnogognic hallucinations, no cataplexy Other comprehensive 14 point system review is negative.   PE BP 126/60 mmHg  Pulse 60  Ht _0  (1.676 m)  Wt 173 lb (78.472 kg)  BMI 27.94 kg/m2  Wt Readings from Last 3 Encounters:  09/02/15 173 lb (78.472 kg)  05/11/15 172 lb (78.019 kg)  02/28/15 176 lb 12.8 oz (80.196 kg)   General: Alert, oriented, no distress.  Skin: normal turgor, no rashes HEENT: Normocephalic, atraumatic. Pupils round and reactive; sclera anicteric;no lid lag.  Nose without nasal septal hypertrophy Mouth/Parynx benign; Mallinpatti scale 3 Neck: No JVD, no carotid bruits; normal carotid upstroke Lungs: clear to ausculatation and percussion; no wheezing or rales Chest wall: Nontender to palpation Heart: RRR, s1 s2 normal 1/6 systolic  murmur; no diastolic murmur.  No S3 gallop.  No rubs, thrills or heaves.. Abdomen: Mild central adiposity ;soft, nontender; no hepatosplenomehaly, BS+; abdominal aorta nontender and not dilated by palpation. Back: No CVA tenderness Pulses 2+ upper extremity.  Bilateral femoral bruits.  Decreased pulses distally in the  dorsalis pedis. Extremities: no clubbing cyanosis or edema, Homan's sign negative  Neurologic: grossly nonfocal Psychologic: normal affect and mood.  ECG (independently read by me): AV dual paced rhythm with 100% capture.  Ventricular rate 60 bpm.  PR interval 206 ms.  ECG  (Independently read by me): Ventricular paced rhythm at 62 bpm; unchanged  October 2015 ECG  (Independently read by me): Ventricular paced rhythm.  Unchanged from prior ECG.  LABS:  BMP Latest Ref Rng 05/10/2015 05/09/2015 03/28/2015  Glucose 65 - 99 mg/dL 80 100(H) 80  BUN 6 - 20 mg/dL 19 24(H) 23  Creatinine 0.61 - 1.24 mg/dL 1.23 1.45(H) 1.25  Sodium 135 - 145 mmol/L 133(L) 134(L) 136  Potassium 3.5 - 5.1 mmol/L 4.8 5.6(H) 4.6  Chloride 101 - 111 mmol/L 102 103 103  CO2 22 - 32 mmol/L 21(L) 24 25  Calcium 8.9 - 10.3 mg/dL 8.5(L) 8.6(L) 8.7    Hepatic Function Latest Ref Rng 05/09/2015 03/28/2015 07/02/2014  Total Protein 6.5 - 8.1 g/dL 7.1 7.5 7.2  Albumin 3.5 - 5.0 g/dL 3.5 3.9 4.1  AST 15 - 41 U/L 13(L) 11 10  ALT 17 - 63 U/L 11(L) <8 <8  Alk Phosphatase 38 - 126 U/L 79 89 80  Total Bilirubin 0.3 - 1.2 mg/dL 0.8 0.6 0.7  Bilirubin, Direct 0.1 - 0.5 mg/dL 0.2 - -     CBC Latest Ref Rng 05/10/2015 05/09/2015 03/28/2015  WBC 4.0 - 10.5 K/uL 4.1 3.8(L) 4.1  Hemoglobin 13.0 - 17.0 g/dL 11.7(L) 11.7(L) 12.7(L)  Hematocrit 39.0 - 52.0 % 35.1(L) 34.5(L) 38.3(L)  Platelets 150 - 400 K/uL 136(L) 144(L) 160    Lab Results  Component Value Date   TSH 4.256 05/09/2015      BNP    Component Value Date/Time   PROBNP 304.0* 11/12/2008 1154    Lipid Panel     Component Value Date/Time   CHOL  203* 03/28/2015 0713   TRIG 122 03/28/2015 0713   HDL 48 03/28/2015 0713   CHOLHDL 4.2 03/28/2015 0713   VLDL 24 03/28/2015 0713   LDLCALC 131* 03/28/2015 0713     RADIOLOGY: No results Peck.    ASSESSMENT AND PLAN: Mr. Huser is an 82 year old gentleman who continues to do fairly well 27 years since his initial bypass surgery and  12 years since his stent was placed to his RCA. He is on medical therapy for distal RCA disease as noted above and seems to be doing well without recurrent angina symptoms.  His last cardiac catheterization in 2015 was  not significantly changed.  He has done well with his increased medical regimen is tolerating Ranexa 1000 twice a day in addition to high dose nitrate therapy, carvedilol 25 mg twice a day.  Due to his blood pressure getting somewhat low, he no longer is taking amlodipine.  His recent laboratory on simvastatin.  Continue to show an elevated LDL at 131.  I am changing his simvastatin to atorvastatin 20 mg for more aggressive lipid-lowering. Laboratory  will be obtained in 2 months.  He continues to be on dual antiplatelet therapy with aspirin and Plavix and is tolerating this well without bleeding.  He is not having any anginal symptoms and continues to be on isosorbide at 60 mg in addition to his ranolazine.  There is no edema on his current dose of furosemide.  He will have a pacemaker follow-up evaluation in late November.  He has seen Dr. Malachy Mood for iron deficiency anemia dating back to  2007.  I will see him in 6 months for cardiology reevaluation.  Time spent: 25 minutes  Troy Sine, MD, [facility]  09/03/2015 4:35 PM

## 2015-09-04 LAB — COMPREHENSIVE METABOLIC PANEL
ALBUMIN: 3.7 g/dL (ref 3.6–5.1)
ALT: 4 U/L — ABNORMAL LOW (ref 9–46)
AST: 10 U/L (ref 10–35)
Alkaline Phosphatase: 76 U/L (ref 40–115)
BILIRUBIN TOTAL: 0.6 mg/dL (ref 0.2–1.2)
BUN: 19 mg/dL (ref 7–25)
CALCIUM: 8.9 mg/dL (ref 8.6–10.3)
CHLORIDE: 104 mmol/L (ref 98–110)
CO2: 27 mmol/L (ref 20–31)
CREATININE: 1.15 mg/dL — AB (ref 0.70–1.11)
Glucose, Bld: 82 mg/dL (ref 65–99)
Potassium: 4.2 mmol/L (ref 3.5–5.3)
SODIUM: 140 mmol/L (ref 135–146)
TOTAL PROTEIN: 7 g/dL (ref 6.1–8.1)

## 2015-09-04 LAB — CBC
HEMATOCRIT: 34.3 % — AB (ref 39.0–52.0)
HEMOGLOBIN: 11.2 g/dL — AB (ref 13.0–17.0)
MCH: 31.5 pg (ref 26.0–34.0)
MCHC: 32.7 g/dL (ref 30.0–36.0)
MCV: 96.3 fL (ref 78.0–100.0)
MPV: 10.2 fL (ref 8.6–12.4)
Platelets: 152 10*3/uL (ref 150–400)
RBC: 3.56 MIL/uL — AB (ref 4.22–5.81)
RDW: 13.9 % (ref 11.5–15.5)
WBC: 3.6 10*3/uL — ABNORMAL LOW (ref 4.0–10.5)

## 2015-09-04 LAB — LIPID PANEL
CHOLESTEROL: 172 mg/dL (ref 125–200)
HDL: 43 mg/dL (ref >=40)
LDL CALC: 106 mg/dL (ref <130)
TRIGLYCERIDES: 117 mg/dL (ref <150)
Total CHOL/HDL Ratio: 4 Ratio (ref <=5.0)
VLDL: 23 mg/dL (ref <30)

## 2015-09-04 LAB — TSH: TSH: 5.92 u[IU]/mL — ABNORMAL HIGH (ref 0.350–4.500)

## 2015-09-14 ENCOUNTER — Other Ambulatory Visit: Payer: Self-pay | Admitting: Cardiovascular Disease

## 2015-09-16 MED ORDER — ISOSORBIDE MONONITRATE ER 60 MG PO TB24: ORAL_TABLET | ORAL | Status: DC

## 2015-09-16 NOTE — Telephone Encounter (Signed)
Called [facility] pharmacy and spoke with Lanny Hurst, pharmacy tech, giving a verbal order for Imdur 60 mg tablet, dispensing 270 tablets, with 1 refill and to contact our office to schedule an appointment for future refills. Lanny Hurst verbalized understanding.

## 2015-09-16 NOTE — Addendum Note (Signed)
Addended by: Derl Barrow on: 09/16/2015 01:19 PM   Modules accepted: Orders

## 2015-09-16 NOTE — Addendum Note (Signed)
Addended by: Derl Barrow on: 09/16/2015 01:30 PM   Modules accepted: Orders

## 2015-09-16 NOTE — Addendum Note (Signed)
Addended by: Derl Barrow on: 09/16/2015 01:20 PM   Modules accepted: Orders

## 2015-09-18 ENCOUNTER — Other Ambulatory Visit: Payer: Self-pay | Admitting: *Deleted

## 2015-09-18 ENCOUNTER — Telehealth: Payer: Self-pay | Admitting: *Deleted

## 2015-09-18 DIAGNOSIS — Z79899 Other long term (current) drug therapy: Secondary | ICD-10-CM

## 2015-09-18 DIAGNOSIS — E785 Hyperlipidemia, unspecified: Secondary | ICD-10-CM

## 2015-09-18 MED ORDER — ATORVASTATIN CALCIUM 40 MG PO TABS: 40.0000 mg | ORAL_TABLET | Freq: Every day | ORAL | Status: DC

## 2015-09-18 NOTE — Telephone Encounter (Signed)
-----   Message from Troy Sine, MD sent at 09/17/2015  2:56 PM EST ----- LDL 131; increase lipitor to 40 mg

## 2015-09-18 NOTE — Telephone Encounter (Signed)
Informed patient's wife of lab results and recommendations. She voiced understanding. Atorvastatin 40 mg will be sent to patient's pharmacy.

## 2015-10-02 ENCOUNTER — Telehealth: Payer: Self-pay | Admitting: Cardiovascular Disease

## 2015-10-02 ENCOUNTER — Encounter (HOSPITAL_COMMUNITY): Payer: Self-pay | Admitting: Emergency Medicine

## 2015-10-02 ENCOUNTER — Emergency Department (HOSPITAL_COMMUNITY)
Admission: EM | Admit: 2015-10-02 | Discharge: 2015-10-02 | Disposition: A | Discharge status: Home / Self Care | Payer: Medicare Other | Attending: Emergency Medicine | Admitting: Emergency Medicine

## 2015-10-02 ENCOUNTER — Emergency Department (HOSPITAL_COMMUNITY): Payer: Medicare Other

## 2015-10-02 ENCOUNTER — Encounter: Payer: Medicare Other | Admitting: Internal Medicine

## 2015-10-02 DIAGNOSIS — R079 Chest pain, unspecified: Secondary | ICD-10-CM | POA: Insufficient documentation

## 2015-10-02 DIAGNOSIS — I129 Hypertensive chronic kidney disease with stage 1 through stage 4 chronic kidney disease, or unspecified chronic kidney disease: Secondary | ICD-10-CM | POA: Insufficient documentation

## 2015-10-02 DIAGNOSIS — D649 Anemia, unspecified: Secondary | ICD-10-CM | POA: Diagnosis not present

## 2015-10-02 DIAGNOSIS — N182 Chronic kidney disease, stage 2 (mild): Secondary | ICD-10-CM | POA: Insufficient documentation

## 2015-10-02 DIAGNOSIS — Z87891 Personal history of nicotine dependence: Secondary | ICD-10-CM | POA: Diagnosis not present

## 2015-10-02 DIAGNOSIS — J988 Other specified respiratory disorders: Secondary | ICD-10-CM

## 2015-10-02 DIAGNOSIS — J441 Chronic obstructive pulmonary disease with (acute) exacerbation: Secondary | ICD-10-CM | POA: Diagnosis not present

## 2015-10-02 DIAGNOSIS — E785 Hyperlipidemia, unspecified: Secondary | ICD-10-CM | POA: Diagnosis not present

## 2015-10-02 DIAGNOSIS — I509 Heart failure, unspecified: Secondary | ICD-10-CM | POA: Insufficient documentation

## 2015-10-02 DIAGNOSIS — I2510 Atherosclerotic heart disease of native coronary artery without angina pectoris: Secondary | ICD-10-CM | POA: Diagnosis not present

## 2015-10-02 DIAGNOSIS — J069 Acute upper respiratory infection, unspecified: Secondary | ICD-10-CM | POA: Insufficient documentation

## 2015-10-02 DIAGNOSIS — Z79899 Other long term (current) drug therapy: Secondary | ICD-10-CM | POA: Diagnosis not present

## 2015-10-02 DIAGNOSIS — Z7902 Long term (current) use of antithrombotics/antiplatelets: Secondary | ICD-10-CM | POA: Insufficient documentation

## 2015-10-02 DIAGNOSIS — Z7982 Long term (current) use of aspirin: Secondary | ICD-10-CM | POA: Insufficient documentation

## 2015-10-02 DIAGNOSIS — R0602 Shortness of breath: Secondary | ICD-10-CM | POA: Diagnosis present

## 2015-10-02 LAB — BASIC METABOLIC PANEL
Anion gap: 7 (ref 5–15)
BUN: 16 mg/dL (ref 6–20)
CALCIUM: 8.6 mg/dL — AB (ref 8.9–10.3)
CHLORIDE: 107 mmol/L (ref 101–111)
CO2: 24 mmol/L (ref 22–32)
CREATININE: 1.45 mg/dL — AB (ref 0.61–1.24)
GFR, EST AFRICAN AMERICAN: 48 mL/min — AB (ref >60)
GFR, EST NON AFRICAN AMERICAN: 41 mL/min — AB (ref >60)
Glucose, Bld: 101 mg/dL — ABNORMAL HIGH (ref 65–99)
Potassium: 4.2 mmol/L (ref 3.5–5.1)
SODIUM: 138 mmol/L (ref 135–145)

## 2015-10-02 LAB — I-STAT TROPONIN, ED: TROPONIN I, POC: 0.02 ng/mL (ref 0.00–0.08)

## 2015-10-02 LAB — BRAIN NATRIURETIC PEPTIDE: B NATRIURETIC PEPTIDE 5: 475.4 pg/mL — AB (ref 0.0–100.0)

## 2015-10-02 LAB — CBC
HCT: 34.3 % — ABNORMAL LOW (ref 39.0–52.0)
HEMOGLOBIN: 11.2 g/dL — AB (ref 13.0–17.0)
MCH: 31.8 pg (ref 26.0–34.0)
MCHC: 32.7 g/dL (ref 30.0–36.0)
MCV: 97.4 fL (ref 78.0–100.0)
Platelets: 135 10*3/uL — ABNORMAL LOW (ref 150–400)
RBC: 3.52 MIL/uL — ABNORMAL LOW (ref 4.22–5.81)
RDW: 14.4 % (ref 11.5–15.5)
WBC: 3.9 10*3/uL — ABNORMAL LOW (ref 4.0–10.5)

## 2015-10-02 MED ORDER — PREDNISONE 20 MG PO TABS: ORAL_TABLET | ORAL | Status: DC

## 2015-10-02 MED ORDER — PREDNISONE 20 MG PO TABS
40.0000 mg | ORAL_TABLET | Freq: Once | ORAL | Status: AC
  Administered 2015-10-02: 40 mg via ORAL
  Filled 2015-10-02: qty 2

## 2015-10-02 MED ORDER — FUROSEMIDE 10 MG/ML IJ SOLN: 20.0000 mg | Freq: Once | INTRAMUSCULAR | Status: DC

## 2015-10-02 MED ORDER — ALBUTEROL SULFATE HFA 108 (90 BASE) MCG/ACT IN AERS
2.0000 | INHALATION_SPRAY | Freq: Once | RESPIRATORY_TRACT | Status: AC
  Administered 2015-10-02: 2 via RESPIRATORY_TRACT
  Filled 2015-10-02: qty 6.7

## 2015-10-02 MED ORDER — IPRATROPIUM-ALBUTEROL 0.5-2.5 (3) MG/3ML IN SOLN
3.0000 mL | RESPIRATORY_TRACT | Status: AC
  Administered 2015-10-02 (×3): 3 mL via RESPIRATORY_TRACT
  Filled 2015-10-02: qty 9

## 2015-10-02 MED ORDER — FUROSEMIDE 20 MG PO TABS
40.0000 mg | ORAL_TABLET | Freq: Once | ORAL | Status: AC
  Administered 2015-10-02: 40 mg via ORAL
  Filled 2015-10-02: qty 2

## 2015-10-02 MED ORDER — AEROCHAMBER PLUS W/MASK MISC
1.0000 | Freq: Once | Status: AC
  Administered 2015-10-02: 1
  Filled 2015-10-02: qty 1

## 2015-10-02 NOTE — ED Notes (Signed)
Patient states intermittent chest pain since 1989.   Patient states he has had to take nitroglycerin 2 x this week, unsure of when, but that it helped.  Patient states no active chest pain right now, but a lot of shortness of breath.   Patient states on no home O2 at home and no other symptoms at this time.  Patient does have audible wheezing upon arriving.

## 2015-10-02 NOTE — Discharge Instructions (Signed)
Albuterol

## 2015-10-02 NOTE — ED Provider Notes (Signed)
CSN: [id number]     Arrival date & time 10/02/15  1253 History   First MD Initiated Contact with Patient 10/02/15 1347     Chief Complaint  Patient presents with  . Chest Pain  . Shortness of Breath     (Consider location/radiation/quality/duration/timing/severity/associated sxs/prior Treatment) Patient is a 81 y.o. male presenting with shortness of breath. The history is provided by the patient and the spouse.  Shortness of Breath Severity:  Moderate Onset quality:  Gradual Duration:  1 week Timing:  Constant Progression:  Worsening Chronicity:  New Associated symptoms: no abdominal pain, no chest pain, no fever, no headaches, no rash and no vomiting     81 year old male with a chief complaint shortness of breath. This been going on for about a week. Patient having some mild cough and wheezing associated with this. Patient unsure if he has a history of heart failure in the past. Has had some orthopnea and PND recently. Denies worsening lower externally swelling. Denies fevers or chills.  Past Medical History  Diagnosis Date  . Hypertension   . Pacemaker Sept 2009    St Jude  . CAD (coronary artery disease)     CABG '89, PCI '04  . Intermediate coronary syndrome ([place])   . Arrhythmia     afib, bradycardia  . Diastolic dysfunction   . Anemia   . Sick sinus syndrome [facility]) Sept 2009    ST Jude PTVDP  . History of COPD   . H/O: GI bleed     REMOTE HISTORY  . Hyperlipidemia   . CHF (congestive heart failure) ([place])     patient states he has a pacemaker  . PAF (paroxysmal atrial fibrillation) ([place])   . CKD (chronic kidney disease), stage II    Past Surgical History  Procedure Laterality Date  . Coronary artery bypass graft  1989    had LIMA to his LAD, a vein to the circumflex. In 2004 underwent stenting to his right coronary artery.  . Cardiac catheterization  11/2011    EF 50%; significant native CAD w/80% stenosis of the LAD after 2nd giagonal vessel and septal  perforating artery w/total occlusion of the mid left anterior descendiung.; 60-70% ostiasl stenosis in the circumflex vessel followed by 40% proximal stenosis and 70-80% distal circumflex stenosis;   . Cataract extraction  2004  . Prostatectomy    . Hemorrhoid surgery    . Insert / replace / remove pacemaker  07/17/08    DUAL-CHAMBER; PPM-ST.JUDE MEDNET  . Coronary angioplasty with stent placement  2004    RCA  . Left heart catheterization with coronary/graft angiogram N/A 12/10/2011    Procedure: LEFT HEART CATHETERIZATION WITH Beatrix Fetters;  Surgeon: Troy Sine, MD;  Location[facility] C[facility] LAB;  Service: Cardiovascular;  Laterality: N/A;  . Left heart catheterization with coronary/graft angiogram N/A 01/26/2014    Procedure: LEFT HEART CATHETERIZATION WITH Beatrix Fetters;  Surgeon: Troy Sine, MD;  Location: [facility] CATH LAB;  Service: Cardiovascular;  Laterality: N/A;   Family History  Problem Relation Age of Onset  . Diabetes Father   . Heart disease Sister   . Heart disease Brother    Social History  Substance Use Topics  . Smoking status: Former Smoker -- 2.00 packs/day for 65 years    Types: Cigarettes  . Smokeless tobacco: Former Systems developer    Quit date: 10/29/2008  . Alcohol Use: No    Review of Systems  Constitutional: Negative for fever and chills.  HENT: Negative  for congestion and facial swelling.   Eyes: Negative for discharge and visual disturbance.  Respiratory: Negative for shortness of breath.   Cardiovascular: Negative for chest pain and palpitations.  Gastrointestinal: Negative for vomiting, abdominal pain and diarrhea.  Musculoskeletal: Negative for myalgias and arthralgias.  Skin: Negative for color change and rash.  Neurological: Negative for tremors, syncope and headaches.  Psychiatric/Behavioral: Negative for confusion and dysphoric mood.      Allergies  Review of patient's allergies indicates no known allergies.  Home Medications    Prior to Admission medications   Medication Sig Start Date End Date Taking? Authorizing Provider  aspirin 81 MG tablet Take 81 mg by mouth daily.    Yes Historical Provider, MD  atorvastatin (LIPITOR) 40 MG tablet Take 1 tablet (40 mg total) by mouth daily. 09/18/15  Yes Troy Sine, MD  carvedilol (COREG) 25 MG tablet Take 25 mg by mouth 2 (two) times daily with a meal.     Yes Historical Provider, MD  clopidogrel (PLAVIX) 75 MG tablet Take 75 mg by mouth daily at 3 pm.    Yes Historical Provider, MD  ferrous sulfate 325 (65 FE) MG tablet Take 325 mg by mouth 3 (three) times daily with meals.    Yes Historical Provider, MD  furosemide (LASIX) 20 MG tablet Take 40 mg by mouth daily.    Yes Historical Provider, MD  isosorbide mononitrate (IMDUR) 60 MG 24 hr tablet TAKE 2 TABLETS BY MOUTH EVERY MORNING AND 1 TABLET IN THE EVENING Patient taking differently: Take 30-60 mg by mouth 2 (two) times daily. TAKE 1 tablet in the morning Take 0.5 tablet in the evening 09/16/15  Yes Troy Sine, MD  lubiprostone (AMITIZA) 24 MCG capsule Take 24 mcg by mouth daily as needed. For bowel movement   Yes Historical Provider, MD  pantoprazole (PROTONIX) 40 MG tablet Take 40 mg by mouth 2 (two) times daily.   Yes Historical Provider, MD  ranolazine (RANEXA) 1000 MG SR tablet Take 1 tablet (1,000 mg total) by mouth 2 (two) times daily. 10/15/14  Yes Troy Sine, MD  tamsulosin (FLOMAX) 0.4 MG CAPS capsule Take 0.4 mg by mouth daily. 02/26/15  Yes Historical Provider, MD  Menthol, Topical Analgesic, (ICY HOT EX) Apply 1 application topically daily as needed (aches and pains).    Historical Provider, MD  NITROSTAT 0.4 MG SL tablet PLACE 1 TABLET UNDER THE TONGUE AS NEEDED. 05/14/15   Troy Sine, MD  predniSONE (DELTASONE) 20 MG tablet 2 tabs po daily x 4 days 10/02/15   Deno Etienne, DO  ranolazine (RANEXA) 1000 MG SR tablet Take 1 tablet (1,000 mg total) by mouth 2 (two) times daily. Patient not taking:  Reported on 10/02/2015 09/02/15   Troy Sine, MD   BP 119/82 mmHg  Pulse 60  Temp(Src) 97.6 F (36.4 C) (Oral)  Resp 20  Ht 5\' 9"  (1.753 m)  Wt 173 lb (78.472 kg)  BMI 25.54 kg/m2  SpO2 97% Physical Exam  Constitutional: He is oriented to person, place, and time. He appears well-developed and well-nourished.  HENT:  Head: Normocephalic and atraumatic.  Eyes: EOM are normal. Pupils are equal, round, and reactive to light.  Neck: Normal range of motion. Neck supple. No JVD present.  Cardiovascular: Normal rate and regular rhythm.  Exam reveals no gallop and no friction rub.   No murmur heard. Pulmonary/Chest: No respiratory distress. He has no wheezes.  Abdominal: He exhibits no distension. There is no  rebound and no guarding.  Musculoskeletal: Normal range of motion.  Neurological: He is alert and oriented to person, place, and time.  Skin: No rash noted. No pallor.  Psychiatric: He has a normal mood and affect. His behavior is normal.  Nursing note and vitals reviewed.   ED Course  Procedures (including critical care time) Labs Review Labs Reviewed  BASIC METABOLIC PANEL - Abnormal; Notable for the following:    Glucose, Bld 101 (*)    Creatinine, Ser 1.45 (*)    Calcium 8.6 (*)    GFR calc non Af Amer 41 (*)    GFR calc Af Amer 48 (*)    All other components within normal limits  CBC - Abnormal; Notable for the following:    WBC 3.9 (*)    RBC 3.52 (*)    Hemoglobin 11.2 (*)    HCT 34.3 (*)    Platelets 135 (*)    All other components within normal limits  BRAIN NATRIURETIC PEPTIDE - Abnormal; Notable for the following:    B Natriuretic Peptide 475.4 (*)    All other components within normal limits  I-STAT TROPOININ, ED    Imaging Review Dg Chest 2 View  10/02/2015  CLINICAL DATA:  Shortness of breath. EXAM: CHEST  2 VIEW COMPARISON:  Mar 13, 2015. FINDINGS: Stable cardiomediastinal silhouette. Status post coronary artery bypass graft. Left-sided pacemaker  is unchanged in position. No pneumothorax or pleural effusion is noted. No acute pulmonary disease is noted. Bony thorax is unremarkable. IMPRESSION: No active cardiopulmonary disease. Electronically Signed   By: Marijo Conception, M.D.   On: 10/02/2015 14:21   I have personally reviewed and evaluated these images and lab results as part of my medical decision-making.   EKG Interpretation   Date/Time:  Wednesday October 02 2015 13:04:11 EST Ventricular Rate:  67 PR Interval:  204 QRS Duration: 178 QT Interval:  482 QTC Calculation: 509 R Axis:   -52 Text Interpretation:  AV dual-paced rhythm Abnormal ECG No significant  change since last tracing Confirmed by Linet Brash MD, Quillian Quince [id number]) on  10/02/2015 2:11:28 PM      MDM   Final diagnoses:  Wheezing-associated respiratory infection (WARI)    81 yo M with a chief complaint of shortness of breath. Wheezes noted in all lung fields. Patient giving a breathing treatment with significant improvement of his symptoms. Feel this is likely a URI with associated wheezing. Patient has no history of COPD as far as he knows. BNP was mildly elevated. Screening trop negative.  Given a dose of Lasix here in the emergency department. PCP follow-up.  4:49 PM:  I have discussed the diagnosis/risks/treatment options with the patient and family and believe the pt to be eligible for discharge home to follow-up with PCP. We also discussed returning to the ED immediately if new or worsening sx occur. We discussed the sx which are most concerning (e.g., sudden worsening sob, chest pain) that necessitate immediate return. Medications administered to the patient during their visit and any new prescriptions provided to the patient are listed below.  Medications given during this visit Medications  furosemide (LASIX) injection 20 mg (20 mg Intravenous Not Given 10/02/15 1536)  ipratropium-albuterol (DUONEB) 0.5-2.5 (3) MG/3ML nebulizer solution 3 mL (3 mLs  Nebulization Given 10/02/15 1425)  predniSONE (DELTASONE) tablet 40 mg (40 mg Oral Given 10/02/15 1429)  albuterol (PROVENTIL HFA;VENTOLIN HFA) 108 (90 BASE) MCG/ACT inhaler 2 puff (2 puffs Inhalation Given 10/02/15 1535)  aerochamber plus with mask  device 1 each (1 each Other Given 10/02/15 1536)  furosemide (LASIX) tablet 40 mg (40 mg Oral Given 10/02/15 1536)    Discharge Medication List as of 10/02/2015  3:32 PM    START taking these medications   Details  predniSONE (DELTASONE) 20 MG tablet 2 tabs po daily x 4 days, Print        The patient appears reasonably screen and/or stabilized for discharge and I doubt any other medical condition or other [facility] requiring further screening, evaluation, or treatment in the ED at this time prior to discharge.      Deno Etienne, DO 10/02/15 1649

## 2015-10-02 NOTE — Telephone Encounter (Signed)
Spoke with pt, he is not feeling well, he is having SOB. He reports the SOB started 2-3 days ago.he will wake up feeling like he is smothering. He has swelling in his ankles for a good while. Pt is to do a device check today by his report. Spoke with the device clinic, his appt today was cancelled. Pt instructed to increase furosemide to twice daily x 3 days. He will eat an extra banana daily as well. They will call back Monday to let us know how he is doing. They are aware they can call anytime over the weekend if they need help. They will call and reschedule appt with dr taylor.

## 2015-10-17 ENCOUNTER — Encounter: Payer: Self-pay | Admitting: *Deleted

## 2015-10-21 NOTE — Progress Notes (Signed)
Cardiology Office Note Date:  10/22/2015  Patient ID:  John Peck, John Peck April 15, 1924, MRN [id number] PCP:  Patricia Nettle, MD  Cardiologist:  Dr. Claiborne Billings Electrophysiologist: Dr. Lovena Le    Chief Complaint: routine EP/in-office pacer check, visit, last was Nov 2015  History of Present Illness: John Peck is a 81 y.o. male with history of CAD/CABG remotely and In 2004 he underwent stenting to his RCA. Cardiac catheterization in January 2013 and on medical therapy. More recently March 2015 due to recurrent increasing symptoms of chest pain he underwent repeat cardiac catheterization which revealed mild LV dysfunction with an EF of 45% inferior wall hypocontractility. There was significant multivessel native coronary obstructive disease with 50% ostial LAD stenosis followed by 80% proximal stenosis in the region of the first diagonal takeoff followed by 80% stenosis after the second diagonal vessel; total occlusion of the ramus intermediate vessel with evidence for bidirectional retrograde collateralization; 40% ostial left circumflex stenosis; and widely patent proximal RCA stent with 80% stenosis in the anterior RV marginal branch, 40% mid or CVA stenosis, 50% distal, and 50% ostial and mid PDA stenoses. He had a atent LIMA graft supplying the mid LAD and an occluded Y graft which previously supplied the intermediate and distal circumflex. He has been on increased medical regimen.  He was as well noted to have LE PVD also treated medically.  He also has PAFib, Sick sinus syndrome with PPM, HTN, COPD, hs of BPH, liver abscess and chronic iron def anemia.  He comes accompanied by his wife today.  He has occassional exertional CP/SOB that he says is at his baseline over the last year or longer, not increasing in frequency or severity, his s/l NTG and rest resolves it.  He has no palpitations, no near syncope or syncope.  He walks with the aid of a cane/walker with mention of a rare fall,  though his wife states since he started always using one or the other he has not fallen in a few months.   Past Medical History  Diagnosis Date  . Hypertension   . Pacemaker Sept 2009    St Jude  . CAD (coronary artery disease)     CABG '89, PCI '04  . Intermediate coronary syndrome ([place])   . Arrhythmia     afib, bradycardia  . Diastolic dysfunction   . Anemia   . Sick sinus syndrome [facility]) Sept 2009    ST Jude PTVDP  . History of COPD   . H/O: GI bleed     REMOTE HISTORY  . Hyperlipidemia   . CHF (congestive heart failure) ([place])     patient states he has a pacemaker  . PAF (paroxysmal atrial fibrillation) ([place])   . CKD (chronic kidney disease), stage II     Past Surgical History  Procedure Laterality Date  . Coronary artery bypass graft  1989    had LIMA to his LAD, a vein to the circumflex. In 2004 underwent stenting to his right coronary artery.  . Cardiac catheterization  11/2011    EF 50%; significant native CAD w/80% stenosis of the LAD after 2nd giagonal vessel and septal perforating artery w/total occlusion of the mid left anterior descendiung.; 60-70% ostiasl stenosis in the circumflex vessel followed by 40% proximal stenosis and 70-80% distal circumflex stenosis;   . Cataract extraction  2004  . Prostatectomy    . Hemorrhoid surgery    . Insert / replace / remove pacemaker  07/17/08    DUAL-CHAMBER; PPM-ST.JUDE  MEDNET  . Coronary angioplasty with stent placement  2004    RCA  . Left heart catheterization with coronary/graft angiogram N/A 12/10/2011    Procedure: LEFT HEART CATHETERIZATION WITH Beatrix Fetters;  Surgeon: Troy Sine, MD;  Location: [facility] CATH LAB;  Service: Cardiovascular;  Laterality: N/A;  . Left heart catheterization with coronary/graft angiogram N/A 01/26/2014    Procedure: LEFT HEART CATHETERIZATION WITH Beatrix Fetters;  Surgeon: Troy Sine, MD;  Location: [facility] CATH LAB;  Service: Cardiovascular;  Laterality: N/A;    Current  Outpatient Prescriptions  Medication Sig Dispense Refill  . aspirin 81 MG tablet Take 81 mg by mouth daily.     Marland Kitchen atorvastatin (LIPITOR) 40 MG tablet Take 1 tablet (40 mg total) by mouth daily. 30 tablet 6  . carvedilol (COREG) 25 MG tablet Take 25 mg by mouth 2 (two) times daily with a meal.      . clopidogrel (PLAVIX) 75 MG tablet Take 75 mg by mouth daily at 3 pm.     . ferrous sulfate 325 (65 FE) MG tablet Take 325 mg by mouth 3 (three) times daily with meals.     . furosemide (LASIX) 20 MG tablet Take 40 mg by mouth daily.     . isosorbide mononitrate (IMDUR) 60 MG 24 hr tablet TAKE 2 TABLETS BY MOUTH EVERY MORNING AND 1 TABLET IN THE EVENING (Patient taking differently: Take 30-60 mg by mouth 2 (two) times daily. TAKE 1 tablet in the morning Take 0.5 tablet in the evening) 270 tablet 1  . lubiprostone (AMITIZA) 24 MCG capsule Take 24 mcg by mouth daily as needed. For bowel movement    . Menthol, Topical Analgesic, (ICY HOT EX) Apply 1 application topically daily as needed (aches and pains).    Marland Kitchen NITROSTAT 0.4 MG SL tablet PLACE 1 TABLET UNDER THE TONGUE AS NEEDED. 25 tablet 4  . pantoprazole (PROTONIX) 40 MG tablet Take 40 mg by mouth 2 (two) times daily.    . predniSONE (DELTASONE) 20 MG tablet 2 tabs po daily x 4 days 8 tablet 0  . ranolazine (RANEXA) 1000 MG SR tablet Take 1 tablet (1,000 mg total) by mouth 2 (two) times daily. 84 tablet 0  . tamsulosin (FLOMAX) 0.4 MG CAPS capsule Take 0.4 mg by mouth daily.  2   No current facility-administered medications for this visit.    Allergies:   Review of patient's allergies indicates no known allergies.   Social History:  The patient  reports that he has quit smoking. His smoking use included Cigarettes. He has a 130 pack-year smoking history. He quit smokeless tobacco use about 6 years ago. He reports that he does not drink alcohol or use illicit drugs.   Family History:  The patient's family history includes Diabetes in his father;  Heart disease in his brother and sister.  ROS:  Please see the history of present illness.   All other systems are reviewed and otherwise negative.   PHYSICAL EXAM:  VS:  BP 132/66 mmHg  Pulse 62  Ht 5\' 9"  (1.753 m)  Wt 175 lb (79.379 kg)  BMI 25.83 kg/m2 BMI: Body mass index is 25.83 kg/(m^2). Well nourished, frail appearing, elderly BM in no acute distress HEENT: normocephalic, atraumatic Neck: no JVD, carotid bruits or masses Cardiac:  normal S1, S2; RRR; no significant murmurs, no rubs, or gallops Lungs:  clear to auscultation bilaterally, no wheezing, rhonchi or rales Abd: soft, nontender,  + BS MS: age appropriate atrophy Ext:  no edema Skin: warm and dry, no rash Neuro:  No gross deficits appreciated Psych: euthymic mood, full affect  PPM site is stable, no tethering or discomfort   EKG:  Done 10/02/15 appears V paced PPM check today shows him in SR, normal device function, AF burden is <1%, longest duration 4.5hours  05/10/15: Echocardiogram: Impressions: - Normal LV size with mild LV hypertrophy. EF 55-60%. Septal-lateral dyssynchrony consistent with RV pacing. Basal inferior hypokinesis. Normal RV size with mildly decreased systolic function. No significant valvular abnormalities.  Recent Labs: 09/02/2015: ALT 4*; TSH 5.920* 10/02/2015: B Natriuretic Peptide 475.4*; BUN 16; Creatinine, Ser 1.45*; Hemoglobin 11.2*; Platelets 135*; Potassium 4.2; Sodium 138  09/02/2015: Cholesterol 172; HDL 43; LDL Cholesterol 106; Total CHOL/HDL Ratio 4.0; Triglycerides 117; VLDL 23   Estimated Creatinine Clearance: 34.5 mL/min (by C-G formula based on Cr of 1.45).   Wt Readings from Last 3 Encounters:  10/22/15 175 lb (79.379 kg)  10/02/15 173 lb (78.472 kg)  09/02/15 173 lb (78.472 kg)     Other studies reviewed: Additional studies/records reviewed today include: summarized above  DEVICE:  STJ PPM, Dr. Lovena Le, 2009  ASSESSMENT AND PLAN: 1. PAfib, sick sinus  syndrome  PPM       CHADS2Vasc is at least 4 not on a/c  He has been maintained on ASA/Plavix secondary to  severe CAD       AF burden on device check is <1%, this appears similar to prior device checks.  At his May 2016 check noting AF episode of 18hrs, and notation that he is felt not an a/c candidate with hx of GI bleed.     Discussed with the patient and wife, they understand and are most comfortable continuing current ASA/Plavix tx      Continue Q 51month [place] checks  2. HTN     Appears well controlled  3. CAD     On medical management     Reports no change in his anginal symptoms in the last year or longer     Sees Dr. Claiborne Billings routinely  Disposition: F/u with Dr. Claiborne Billings as usual, we will continue his  q 9month remotes and see him back in 1 year EP visit, sooner if needed.  Current medicines are reviewed at length with the patient today.  The patient did not have any concerns regarding medicines.  Haywood Lasso, PA-C 10/22/2015 11:45 AM     [facility] [address] [place] [address] [phone] (office)  [phone] (fax)

## 2015-10-22 ENCOUNTER — Encounter: Payer: Self-pay | Admitting: Physician Assistant

## 2015-10-22 ENCOUNTER — Ambulatory Visit (INDEPENDENT_AMBULATORY_CARE_PROVIDER_SITE_OTHER): Payer: Medicare Other | Admitting: Physician Assistant

## 2015-10-22 ENCOUNTER — Encounter: Payer: Self-pay | Admitting: Internal Medicine

## 2015-10-22 VITALS — BP 132/66 | HR 62 | Ht 69.0 in | Wt 175.0 lb

## 2015-10-22 DIAGNOSIS — I495 Sick sinus syndrome: Secondary | ICD-10-CM

## 2015-10-22 DIAGNOSIS — I480 Paroxysmal atrial fibrillation: Secondary | ICD-10-CM | POA: Diagnosis not present

## 2015-10-22 DIAGNOSIS — I10 Essential (primary) hypertension: Secondary | ICD-10-CM

## 2015-10-22 LAB — CUP PACEART INCLINIC DEVICE CHECK
Date Time Interrogation Session: 20161213171533
Implantable Lead Implant Date: 20090908
Implantable Lead Location: 753859
MDC IDC LEAD IMPLANT DT: 20090908
MDC IDC LEAD LOCATION: 753860
MDC IDC PG SERIAL: 1266473

## 2015-10-22 NOTE — Patient Instructions (Addendum)
Medication Instructions:   Your physician recommends that you continue on your current medications as directed. Please refer to the Current Medication list given to you today.   If you need a refill on your cardiac medications before your next appointment, please call your pharmacy.  Labwork: NONE ORDER TODAY    Testing/Procedures: NONE ORDER TODAY    Follow-Up: Your physician wants you to follow-up in: [place] will receive a reminder letter in the mail two months in advance. If you don't receive a letter, please call our office to schedule the follow-up appointment.  Remote monitoring is used to monitor your Pacemaker of ICD from home. This monitoring reduces the number of office visits required to check your device to one time per year. It allows [place] to keep an eye on the functioning of your device to ensure it is working properly. You are scheduled for a device check from home on . 01/20/16 You may send your transmission at any time that day. If you have a wireless device, the transmission will be sent automatically. After your physician reviews your transmission, you will receive a postcard with your next transmission date.   Any Other Special Instructions Will Be Listed Below (If Applicable).

## 2015-10-31 ENCOUNTER — Telehealth: Payer: Self-pay | Admitting: Cardiovascular Disease

## 2015-10-31 NOTE — Telephone Encounter (Signed)
Received a call from [place] at [place] lab patient is there to have lipid panel he forgot his insurance cards.Advised patient has [employer].ID # given.

## 2015-11-01 LAB — LIPID PANEL
CHOL/HDL RATIO: 3.4 ratio (ref <=5.0)
CHOLESTEROL: 149 mg/dL (ref 125–200)
HDL: 44 mg/dL (ref >=40)
LDL Cholesterol: 87 mg/dL (ref <130)
Triglycerides: 90 mg/dL (ref <150)
VLDL: 18 mg/dL (ref <30)

## 2015-11-01 LAB — COMPREHENSIVE METABOLIC PANEL
ALBUMIN: 3.6 g/dL (ref 3.6–5.1)
ALK PHOS: 83 U/L (ref 40–115)
ALT: 7 U/L — ABNORMAL LOW (ref 9–46)
AST: 10 U/L (ref 10–35)
BUN: 17 mg/dL (ref 7–25)
CO2: 27 mmol/L (ref 20–31)
Calcium: 8.4 mg/dL — ABNORMAL LOW (ref 8.6–10.3)
Chloride: 104 mmol/L (ref 98–110)
Creat: 1.29 mg/dL — ABNORMAL HIGH (ref 0.70–1.11)
Glucose, Bld: 85 mg/dL (ref 65–99)
Potassium: 4 mmol/L (ref 3.5–5.3)
SODIUM: 138 mmol/L (ref 135–146)
Total Bilirubin: 0.7 mg/dL (ref 0.2–1.2)
Total Protein: 6.5 g/dL (ref 6.1–8.1)

## 2015-11-14 ENCOUNTER — Encounter: Payer: Self-pay | Admitting: *Deleted

## 2015-11-27 ENCOUNTER — Encounter (HOSPITAL_COMMUNITY): Payer: Self-pay

## 2015-11-27 ENCOUNTER — Emergency Department (HOSPITAL_COMMUNITY): Payer: Medicare HMO

## 2015-11-27 ENCOUNTER — Other Ambulatory Visit: Payer: Self-pay | Admitting: *Deleted

## 2015-11-27 ENCOUNTER — Other Ambulatory Visit: Payer: Self-pay | Admitting: Cardiovascular Disease

## 2015-11-27 ENCOUNTER — Inpatient Hospital Stay (HOSPITAL_COMMUNITY)
Admission: EM | Admit: 2015-11-27 | Discharge: 2015-11-30 | DRG: 246 | Disposition: A | Discharge status: Home Health | Payer: Medicare HMO | Attending: Internal Medicine | Admitting: Internal Medicine

## 2015-11-27 DIAGNOSIS — I25110 Atherosclerotic heart disease of native coronary artery with unstable angina pectoris: Secondary | ICD-10-CM

## 2015-11-27 DIAGNOSIS — R079 Chest pain, unspecified: Secondary | ICD-10-CM | POA: Diagnosis not present

## 2015-11-27 DIAGNOSIS — E785 Hyperlipidemia, unspecified: Secondary | ICD-10-CM | POA: Diagnosis present

## 2015-11-27 DIAGNOSIS — T82855A Stenosis of coronary artery stent, initial encounter: Secondary | ICD-10-CM | POA: Diagnosis present

## 2015-11-27 DIAGNOSIS — I480 Paroxysmal atrial fibrillation: Secondary | ICD-10-CM | POA: Diagnosis present

## 2015-11-27 DIAGNOSIS — Z862 Personal history of diseases of the blood and blood-forming organs and certain disorders involving the immune mechanism: Secondary | ICD-10-CM

## 2015-11-27 DIAGNOSIS — D509 Iron deficiency anemia, unspecified: Secondary | ICD-10-CM | POA: Diagnosis present

## 2015-11-27 DIAGNOSIS — I10 Essential (primary) hypertension: Secondary | ICD-10-CM | POA: Diagnosis not present

## 2015-11-27 DIAGNOSIS — I5032 Chronic diastolic (congestive) heart failure: Secondary | ICD-10-CM | POA: Diagnosis present

## 2015-11-27 DIAGNOSIS — I2510 Atherosclerotic heart disease of native coronary artery without angina pectoris: Secondary | ICD-10-CM | POA: Diagnosis present

## 2015-11-27 DIAGNOSIS — Z7902 Long term (current) use of antithrombotics/antiplatelets: Secondary | ICD-10-CM

## 2015-11-27 DIAGNOSIS — Z950 Presence of cardiac pacemaker: Secondary | ICD-10-CM

## 2015-11-27 DIAGNOSIS — I119 Hypertensive heart disease without heart failure: Secondary | ICD-10-CM | POA: Diagnosis present

## 2015-11-27 DIAGNOSIS — Z7982 Long term (current) use of aspirin: Secondary | ICD-10-CM

## 2015-11-27 DIAGNOSIS — Z79899 Other long term (current) drug therapy: Secondary | ICD-10-CM

## 2015-11-27 DIAGNOSIS — Z955 Presence of coronary angioplasty implant and graft: Secondary | ICD-10-CM

## 2015-11-27 DIAGNOSIS — N183 Chronic kidney disease, stage 3 unspecified: Secondary | ICD-10-CM | POA: Diagnosis present

## 2015-11-27 DIAGNOSIS — J441 Chronic obstructive pulmonary disease with (acute) exacerbation: Secondary | ICD-10-CM | POA: Diagnosis present

## 2015-11-27 DIAGNOSIS — Z8249 Family history of ischemic heart disease and other diseases of the circulatory system: Secondary | ICD-10-CM

## 2015-11-27 DIAGNOSIS — I209 Angina pectoris, unspecified: Secondary | ICD-10-CM | POA: Diagnosis present

## 2015-11-27 DIAGNOSIS — Z87891 Personal history of nicotine dependence: Secondary | ICD-10-CM

## 2015-11-27 DIAGNOSIS — I447 Left bundle-branch block, unspecified: Secondary | ICD-10-CM | POA: Diagnosis present

## 2015-11-27 DIAGNOSIS — J45909 Unspecified asthma, uncomplicated: Secondary | ICD-10-CM | POA: Diagnosis present

## 2015-11-27 DIAGNOSIS — Z951 Presence of aortocoronary bypass graft: Secondary | ICD-10-CM

## 2015-11-27 DIAGNOSIS — N179 Acute kidney failure, unspecified: Secondary | ICD-10-CM | POA: Diagnosis present

## 2015-11-27 DIAGNOSIS — Z9849 Cataract extraction status, unspecified eye: Secondary | ICD-10-CM

## 2015-11-27 DIAGNOSIS — I495 Sick sinus syndrome: Secondary | ICD-10-CM | POA: Diagnosis present

## 2015-11-27 DIAGNOSIS — I5043 Acute on chronic combined systolic (congestive) and diastolic (congestive) heart failure: Secondary | ICD-10-CM | POA: Diagnosis present

## 2015-11-27 DIAGNOSIS — I739 Peripheral vascular disease, unspecified: Secondary | ICD-10-CM | POA: Diagnosis present

## 2015-11-27 DIAGNOSIS — I130 Hypertensive heart and chronic kidney disease with heart failure and stage 1 through stage 4 chronic kidney disease, or unspecified chronic kidney disease: Secondary | ICD-10-CM | POA: Diagnosis present

## 2015-11-27 HISTORY — DX: Hypertensive heart disease without heart failure: I11.9

## 2015-11-27 LAB — CBC
HEMATOCRIT: 32.8 % — AB (ref 39.0–52.0)
HEMOGLOBIN: 10.9 g/dL — AB (ref 13.0–17.0)
MCH: 31.7 pg (ref 26.0–34.0)
MCHC: 33.2 g/dL (ref 30.0–36.0)
MCV: 95.3 fL (ref 78.0–100.0)
Platelets: 123 10*3/uL — ABNORMAL LOW (ref 150–400)
RBC: 3.44 MIL/uL — AB (ref 4.22–5.81)
RDW: 14.5 % (ref 11.5–15.5)
WBC: 4.2 10*3/uL (ref 4.0–10.5)

## 2015-11-27 LAB — BASIC METABOLIC PANEL
Anion gap: 11 (ref 5–15)
BUN: 15 mg/dL (ref 6–20)
CO2: 26 mmol/L (ref 22–32)
Calcium: 8.9 mg/dL (ref 8.9–10.3)
Chloride: 101 mmol/L (ref 101–111)
Creatinine, Ser: 1.4 mg/dL — ABNORMAL HIGH (ref 0.61–1.24)
GFR, EST AFRICAN AMERICAN: 50 mL/min — AB (ref >60)
GFR, EST NON AFRICAN AMERICAN: 43 mL/min — AB (ref >60)
Glucose, Bld: 116 mg/dL — ABNORMAL HIGH (ref 65–99)
POTASSIUM: 4.1 mmol/L (ref 3.5–5.1)
SODIUM: 138 mmol/L (ref 135–145)

## 2015-11-27 LAB — PROTIME-INR
INR: 1.26 (ref 0.00–1.49)
PROTHROMBIN TIME: 15.9 s — AB (ref 11.6–15.2)

## 2015-11-27 LAB — I-STAT TROPONIN, ED
TROPONIN I, POC: 0.01 ng/mL (ref 0.00–0.08)
Troponin i, poc: 0.02 ng/mL (ref 0.00–0.08)

## 2015-11-27 MED ORDER — ALBUTEROL SULFATE (2.5 MG/3ML) 0.083% IN NEBU
2.5000 mg | INHALATION_SOLUTION | RESPIRATORY_TRACT | Status: AC
  Administered 2015-11-28: 2.5 mg via RESPIRATORY_TRACT
  Filled 2015-11-27: qty 3

## 2015-11-27 MED ORDER — ASPIRIN 81 MG PO CHEW
243.0000 mg | CHEWABLE_TABLET | Freq: Once | ORAL | Status: AC
  Administered 2015-11-27: 243 mg via ORAL
  Filled 2015-11-27: qty 3

## 2015-11-27 MED ORDER — RANOLAZINE ER 1000 MG PO TB12: 1000.0000 mg | ORAL_TABLET | Freq: Two times a day (BID) | ORAL | Status: DC

## 2015-11-27 MED ORDER — NITROGLYCERIN 2 % TD OINT
2.0000 [in_us] | TOPICAL_OINTMENT | Freq: Once | TRANSDERMAL | Status: AC
  Administered 2015-11-27: 2 [in_us] via TOPICAL
  Filled 2015-11-27: qty 2

## 2015-11-27 MED ORDER — ALBUTEROL SULFATE (2.5 MG/3ML) 0.083% IN NEBU
2.5000 mg | INHALATION_SOLUTION | RESPIRATORY_TRACT | Status: DC | PRN
  Filled 2015-11-27: qty 3

## 2015-11-27 MED ORDER — NITROGLYCERIN 0.4 MG SL SUBL
0.4000 mg | SUBLINGUAL_TABLET | SUBLINGUAL | Status: AC | PRN
  Administered 2015-11-27 (×3): 0.4 mg via SUBLINGUAL

## 2015-11-27 NOTE — H&P (Signed)
PCP: Patricia Nettle, MD  Cardiology [facility]  Referring provider [place]    Chief Complaint:   Chest pain  HPI: John Peck is a 82 y.o. male   has a past medical history of Hypertension; Pacemaker (Sept 2009); CAD (coronary artery disease); Intermediate coronary syndrome ([place]); Arrhythmia; Diastolic dysfunction; Anemia; Sick sinus syndrome [facility]) (Sept 2009); History of COPD; H/O: GI bleed; Hyperlipidemia; CHF (congestive heart failure) ([place]); PAF (paroxysmal atrial fibrillation) ([place]); and CKD (chronic kidney disease), stage II.   Presented with  Months history of left side chest pain with associate shortness of breath he's been having intermittent episodes sometimes at rest. Today had an episode required 1 nitroglycerin.  He reports nothing makes his symptoms better or worse pain is nonradiating no associated nausea vomiting. He is been episodes of waking up feeling short of breath and he had some swellings in his ankles. Patient has discussed this in the past with his cardiology who is increased his Lasix dose. He also reports some wheezing and cough that is worse then usual. Patient was concerned that his Chest pain was a bit more frequent and that  His wheezing was worse and he presented to emergency department. While in ER he had another episode required free nitroglycerin tablets as well as Nitropaste currently finally chest pain-free. Note patient was seen by cardiology on January 13 reporting the similar symptoms chest pain usually resolves with nitroglycerin. ECG non diagnostic and troponin 0.01 ER provider discussed case with cardiology who recommended admit to medicine.    Patient has known history of coronary artery disease status post CABG in 2004 and stenting to his RCA during catheterization in January 2013 currently he was readmitted and had another cardiac catheterization done in March 2015 showing EF of 45% with inferior wall motion abnormality at that time  he had showed extensive coronary artery disease with attempted stent that LIMA graft supplying the mid LAD. Patient has history of paroxysmal atrial fibrillation with CHADS2 vascular score also for but not on anticoagulation. Maintained on aspirin and Plavix secondary to severe coronary artery disease and felt not to be a candidate for triple C coagulation secondary to history of GI bleeds. He has history of sick sinus syndrome with pacemaker placement. His known history of peripheral vascular disease and pulling his lower extremities.   Hospitalist was called for admission for Chest pain  Review of Systems:    Pertinent positives include: shortness of breath at rest. chest pain, dyspnea on exertion, Bilateral lower extremity swelling   Constitutional:  No weight loss, night sweats, Fevers, chills, fatigue, weight loss  HEENT:  No headaches, Difficulty swallowing,Tooth/dental problems,Sore throat,  No sneezing, itching, ear ache, nasal congestion, post nasal drip,  Cardio-vascular:  No Orthopnea, PND, anasarca, dizziness, palpitations.no GI:  No heartburn, indigestion, abdominal pain, nausea, vomiting, diarrhea, change in bowel habits, loss of appetite, melena, blood in stool, hematemesis Resp:    No excess mucus, no productive cough, No non-productive cough, No coughing up of blood.No change in color of mucus.No wheezing. Skin:  no rash or lesions. No jaundice GU:  no dysuria, change in color of urine, no urgency or frequency. No straining to urinate.  No flank pain.  Musculoskeletal:  No joint pain or no joint swelling. No decreased range of motion. No back pain.  Psych:  No change in mood or affect. No depression or anxiety. No memory loss.  Neuro: no localizing neurological complaints, no tingling, no weakness, no double vision, no gait abnormality,  no slurred speech, no confusion  Otherwise ROS are negative except for above, 10 systems were reviewed  Past Medical  History: Past Medical History  Diagnosis Date  . Hypertension   . Pacemaker Sept 2009    St Jude  . CAD (coronary artery disease)     CABG '89, PCI '04  . Intermediate coronary syndrome ([place])   . Arrhythmia     afib, bradycardia  . Diastolic dysfunction   . Anemia   . Sick sinus syndrome [facility]) Sept 2009    ST Jude PTVDP  . History of COPD   . H/O: GI bleed     REMOTE HISTORY  . Hyperlipidemia   . CHF (congestive heart failure) ([place])     patient states he has a pacemaker  . PAF (paroxysmal atrial fibrillation) ([place])   . CKD (chronic kidney disease), stage II    Past Surgical History  Procedure Laterality Date  . Coronary artery bypass graft  1989    had LIMA to his LAD, a vein to the circumflex. In 2004 underwent stenting to his right coronary artery.  . Cardiac catheterization  11/2011    EF 50%; significant native CAD w/80% stenosis of the LAD after 2nd giagonal vessel and septal perforating artery w/total occlusion of the mid left anterior descendiung.; 60-70% ostiasl stenosis in the circumflex vessel followed by 40% proximal stenosis and 70-80% distal circumflex stenosis;   . Cataract extraction  2004  . Prostatectomy    . Hemorrhoid surgery    . Insert / replace / remove pacemaker  07/17/08    DUAL-CHAMBER; [id number].JUDE MEDNET  . Coronary angioplasty with stent placement  2004    RCA  . Left heart catheterization with coronary/graft angiogram N/A 12/10/2011    Procedure: LEFT HEART CATHETERIZATION WITH Beatrix Fetters;  Surgeon: Troy Sine, MD;  Location: [facility] CATH LAB;  Service: Cardiovascular;  Laterality: N/A;  . Left heart catheterization with coronary/graft angiogram N/A 01/26/2014    Procedure: LEFT HEART CATHETERIZATION WITH Beatrix Fetters;  Surgeon: Troy Sine, MD;  Location: [facility] CATH LAB;  Service: Cardiovascular;  Laterality: N/A;     Medications: Prior to Admission medications   Medication Sig Start Date End Date Taking? Authorizing  Provider  aspirin 81 MG tablet Take 81 mg by mouth daily.    Yes Historical Provider, MD  atorvastatin (LIPITOR) 40 MG tablet Take 1 tablet (40 mg total) by mouth daily. 09/18/15  Yes Troy Sine, MD  carvedilol (COREG) 25 MG tablet Take 25 mg by mouth 2 (two) times daily with a meal.     Yes Historical Provider, MD  clopidogrel (PLAVIX) 75 MG tablet Take 75 mg by mouth daily at 3 pm.    Yes Historical Provider, MD  ferrous sulfate 325 (65 FE) MG tablet Take 325 mg by mouth 3 (three) times daily with meals.    Yes Historical Provider, MD  furosemide (LASIX) 20 MG tablet Take 40 mg by mouth daily.    Yes Historical Provider, MD  isosorbide mononitrate (IMDUR) 60 MG 24 hr tablet TAKE 2 TABLETS BY MOUTH EVERY MORNING AND 1 TABLET IN THE EVENING Patient taking differently: Take 30-60 mg by mouth 2 (two) times daily. TAKE 1 tablet in the morning Take 0.5 tablet in the evening 09/16/15  Yes Troy Sine, MD  lubiprostone (AMITIZA) 24 MCG capsule Take 24 mcg by mouth daily as needed. For bowel movement   Yes Historical Provider, MD  Menthol, Topical Analgesic, (ICY HOT EX) Apply  1 application topically daily as needed (aches and pains).   Yes Historical Provider, MD  NITROSTAT 0.4 MG SL tablet PLACE 1 TABLET UNDER THE TONGUE AS NEEDED. 05/14/15  Yes Troy Sine, MD  pantoprazole (PROTONIX) 40 MG tablet Take 40 mg by mouth 2 (two) times daily.   Yes Historical Provider, MD  ranolazine (RANEXA) 1000 MG SR tablet Take 1 tablet (1,000 mg total) by mouth 2 (two) times daily. 11/27/15  Yes Troy Sine, MD  tamsulosin (FLOMAX) 0.4 MG CAPS capsule Take 0.4 mg by mouth daily. 02/26/15  Yes Historical Provider, MD  predniSONE (DELTASONE) 20 MG tablet 2 tabs po daily x 4 days 10/02/15   Deno Etienne, DO    Allergies:  No Known Allergies  Social History:  Ambulatory walker or cane  Lives at home  With family     reports that he has quit smoking. His smoking use included Cigarettes. He has a 130 pack-year  smoking history. He quit smokeless tobacco use about 7 years ago. He reports that he does not drink alcohol or use illicit drugs.     Family History: family history includes Diabetes in his father; Heart disease in his brother and sister.    Physical Exam: Patient Vitals for the past 24 hrs:  BP Temp Temp src Pulse Resp SpO2 Weight  11/27/15 2215 179/91 mmHg - - 64 17 95 % -  11/27/15 2200 187/99 mmHg - - 62 21 98 % -  11/27/15 2145 192/93 mmHg - - 64 15 96 % -  11/27/15 2130 170/91 mmHg - - 65 19 96 % -  11/27/15 2115 171/94 mmHg - - 65 19 97 % -  11/27/15 2100 (!) 189/102 mmHg - - 65 22 98 % -  11/27/15 2045 (!) 175/118 mmHg - - 103 24 96 % -  11/27/15 2030 (!) 186/108 mmHg - - 64 21 96 % -  11/27/15 2016 - - - - - - 79.3 kg (174 lb 13.2 oz)  11/27/15 2000 149/98 mmHg - - 105 19 96 % -  11/27/15 1945 183/85 mmHg - - (!) 59 16 98 % -  11/27/15 1801 153/77 mmHg 98.3 F (36.8 C) Oral 63 15 100 % -  11/27/15 1517 166/82 mmHg 97.7 F (36.5 C) Oral 64 16 98 % -    1. General:  in No Acute distress 2. Psychological: Alert and   Oriented 3. Head/ENT:   Moist Mucous Membranes                          Head Non traumatic, neck supple                           Poor Dentition 4. SKIN:   decreased Skin turgor,  Skin clean Dry and intact no rash 5. Heart: Regular rate and rhythm no Murmur, Rub or gallop 6. Lungs: some wheezes bilateraly no crackles   7. Abdomen: Soft, non-tender, Non distended 8. Lower extremities: no clubbing, cyanosis,   Edema R>L which is chronic secondary to a vein grafting 9. Neurologically Grossly intact, moving all 4 extremities equally 10. MSK: Normal range of motion  body mass index is 25.81 kg/(m^2).   Labs on Admission:   Results for orders placed or performed during the hospital encounter of 11/27/15 (from the past 24 hour(s))  Basic metabolic panel     Status: Abnormal   Collection Time: 11/27/15  3:16 PM  Result Value Ref Range   Sodium 138 135 - 145  mmol/L   Potassium 4.1 3.5 - 5.1 mmol/L   Chloride 101 101 - 111 mmol/L   CO2 26 22 - 32 mmol/L   Glucose, Bld 116 (H) 65 - 99 mg/dL   BUN 15 6 - 20 mg/dL   Creatinine, Ser 1.40 (H) 0.61 - 1.24 mg/dL   Calcium 8.9 8.9 - 10.3 mg/dL   GFR calc non Af Amer 43 (L) >60 mL/min   GFR calc Af Amer 50 (L) >60 mL/min   Anion gap 11 5 - 15  CBC     Status: Abnormal   Collection Time: 11/27/15  3:16 PM  Result Value Ref Range   WBC 4.2 4.0 - 10.5 K/uL   RBC 3.44 (L) 4.22 - 5.81 MIL/uL   Hemoglobin 10.9 (L) 13.0 - 17.0 g/dL   HCT 32.8 (L) 39.0 - 52.0 %   MCV 95.3 78.0 - 100.0 fL   MCH 31.7 26.0 - 34.0 pg   MCHC 33.2 30.0 - 36.0 g/dL   RDW 14.5 11.5 - 15.5 %   Platelets 123 (L) 150 - 400 K/uL  Protime-INR - (order if Patient is taking Coumadin / Warfarin)     Status: Abnormal   Collection Time: 11/27/15  3:16 PM  Result Value Ref Range   Prothrombin Time 15.9 (H) 11.6 - 15.2 seconds   INR 1.26 0.00 - 1.49  I-stat troponin, ED (not at [facility], [facility])     Status: None   Collection Time: 11/27/15  3:29 PM  Result Value Ref Range   Troponin i, poc 0.02 0.00 - 0.08 ng/mL   Comment 3          I-Stat Troponin, ED (not at [facility])     Status: None   Collection Time: 11/27/15  8:09 PM  Result Value Ref Range   Troponin i, poc 0.01 0.00 - 0.08 ng/mL   Comment 3            UA not obtained  No results found for: HGBA1C  Estimated Creatinine Clearance: 35.8 mL/min (by C-G formula based on Cr of 1.4).  BNP (last 3 results) No results for input(s): PROBNP in the last 8760 hours.  Other results:  I have pearsonaly reviewed this: ECG REPORT  Rate: 108  Rhythm: Left bundle branch block ST&T Change: No change from prior   [facility] Weights   11/27/15 2016  Weight: 79.3 kg (174 lb 13.2 oz)     Cultures:    Component Value Date/Time   SDES  05/29/2011 0130    BLOOD BOTTLES DRAWN AEROBIC AND ANAEROBIC 5CC L HAND   SPECREQUEST IMMUNE:NORM VAR 05/29/2011 0130   CULT NO GROWTH 5 DAYS 05/29/2011  0130   REPTSTATUS 06/04/2011 FINAL 05/29/2011 0130     Radiological Exams on Admission: Dg Chest 2 View  11/27/2015  CLINICAL DATA:  Per patient since christmas patient has had severe sharp pain in the middle and left side of his chest and has been severely SOB, patient states he took nitroglycerin last night and it helped his sharp chest pain a little. HX pa.*comment was truncated* EXAM: CHEST  2 VIEW COMPARISON:  10/02/2015 FINDINGS: Artifact degradation on the lateral view posteriorly. Prior median sternotomy. Pacer with leads at right atrium and right ventricle. No lead discontinuity. Remote posterior seventh left rib fracture. Midline trachea. Mild cardiomegaly with a tortuous thoracic aorta. Right paratracheal soft tissue density has been present back to  12/08/2011 and is likely due to prominent great vessels. Left costophrenic angle blunting is chronic and likely due to a pleural thickening. No congestive failure. Left base scarring. Increased density projecting over the right upper lobe is felt to be due to osseous summation. IMPRESSION: No acute cardiopulmonary disease. Cardiomegaly with left base scarring and pleural thickening. Electronically Signed   By: Abigail Miyamoto M.D.   On: 11/27/2015 16:07    Chart has been reviewed  Family not  at  Bedside    Assessment/Plan 82 year old gentleman with known history of chronic diastolic heart failure coronary artery disease status post pacemaker for sick sinus syndrome and history of paroxysmal atrial fibrillation on aspirin and Plavix presents with chest pain and worsening dyspnea with possible COPD component   Present on Admission:   . CAD in native artery obtain cardiology consult in the morning given worsening chest pain continue cycle cardiac enzymes monitor on telemetry continue statin and aspirin continue Plavix and Imdur  . PAF (paroxysmal atrial fibrillation) ([place]) patient not a candidate for anticoagulation secondary to prior GI bleeding.  Continue rate management with beta blocker but changed to Zebeta given wheezing  . Reactive airway disease administer steroids albuterol as needed and Atrovent patient has extensive smoking history  . Angina pectoris ([place]) patient has history of chronic angina with significant coronary artery disease will obtain cardiology consult in the morning, monitor on telemetry cycle cardiac enzymes. Continue aspirin and Plavix . Acute on Chronic diastolic CHF (congestive heart failure) ([place]) Will admit and diurese. Repeat echogram cycle cardiac enzymes . PACEMAKER, PERMANENT- St Jude Sept 2009 stable continue to monitor  . Essential hypertension continue  -home medications age from Coreg to [place]  Prophylaxis:  Lovenox   CODE STATUS:  FULL CODE  as per patient   Disposition:  To home once workup is complete and patient is stable  Other plan as per orders.  I have spent a total of 69 min on this admission  Dallas Torok 11/28/2015, 2:45 AM    [facility]  Pager [phone]   after 2 AM please page floor coverage PA If 7AM-7PM, please contact the day team taking care of the patient  [email or web address]  Password [email or web address]

## 2015-11-27 NOTE — Telephone Encounter (Signed)
Rx request sent to pharmacy.  

## 2015-11-27 NOTE — ED Notes (Signed)
Pt here with c/o central and left side chest pain associated with SOB. He states he has had trouble laying down. Has pacemaker. Denies current CP but states he had CP last night and took one nitro lab before bed and it helped.

## 2015-11-27 NOTE — ED Provider Notes (Signed)
CSN: [id number]     Arrival date & time 11/27/15  1452 History   First MD Initiated Contact with Patient 11/27/15 1936     Chief Complaint  Patient presents with  . Chest Pain  . Shortness of Breath     (Consider location/radiation/quality/duration/timing/severity/associated sxs/prior Treatment) HPI Patient is vague historian. Complains of anterior chest pain onset several weeks ago. He has  required sublingual nitroglycerin several times per week. He last used sublingual nitroglycerin yesterday.. Nothing makes symptoms better worse. Pain is anterior. Nonradiating associated symptoms include shortness of breath. No other associated symptoms.. Patient presently asymptomatic Past Medical History  Diagnosis Date  . Hypertension   . Pacemaker Sept 2009    St Jude  . CAD (coronary artery disease)     CABG '89, PCI '04  . Intermediate coronary syndrome ([place])   . Arrhythmia     afib, bradycardia  . Diastolic dysfunction   . Anemia   . Sick sinus syndrome [facility]) Sept 2009    ST Jude PTVDP  . History of COPD   . H/O: GI bleed     REMOTE HISTORY  . Hyperlipidemia   . CHF (congestive heart failure) ([place])     patient states he has a pacemaker  . PAF (paroxysmal atrial fibrillation) ([place])   . CKD (chronic kidney disease), stage II    Past Surgical History  Procedure Laterality Date  . Coronary artery bypass graft  1989    had LIMA to his LAD, a vein to the circumflex. In 2004 underwent stenting to his right coronary artery.  . Cardiac catheterization  11/2011    EF 50%; significant native CAD w/80% stenosis of the LAD after 2nd giagonal vessel and septal perforating artery w/total occlusion of the mid left anterior descendiung.; 60-70% ostiasl stenosis in the circumflex vessel followed by 40% proximal stenosis and 70-80% distal circumflex stenosis;   . Cataract extraction  2004  . Prostatectomy    . Hemorrhoid surgery    . Insert / replace / remove pacemaker  07/17/08    DUAL-CHAMBER;  PPM-ST.JUDE MEDNET  . Coronary angioplasty with stent placement  2004    RCA  . Left heart catheterization with coronary/graft angiogram N/A 12/10/2011    Procedure: LEFT HEART CATHETERIZATION WITH Beatrix Fetters;  Surgeon: Troy Sine, MD;  Location: [facility] C[facility] LAB;  Service: Cardiovascular;  Laterality: N/A;  . Left heart catheterization with coronary/graft angiogram N/A 01/26/2014    Procedure: LEFT HEART CATHETERIZATION WITH Beatrix Fetters;  Surgeon: Troy Sine, MD;  Location: [facility] CATH LAB;  Service: Cardiovascular;  Laterality: N/A;   Family History  Problem Relation Age of Onset  . Diabetes Father   . Heart disease Sister   . Heart disease Brother    Social History  Substance Use Topics  . Smoking status: Former Smoker -- 2.00 packs/day for 65 years    Types: Cigarettes  . Smokeless tobacco: Former Systems developer    Quit date: 10/29/2008  . Alcohol Use: No    Review of Systems  Constitutional: Negative.   HENT: Negative.   Respiratory: Positive for shortness of breath.   Cardiovascular: Positive for chest pain and leg swelling.       Chronic leg edema  Gastrointestinal: Negative.   Musculoskeletal: Negative.   Skin: Negative.   Neurological: Negative.   Psychiatric/Behavioral: Negative.   All other systems reviewed and are negative.     Allergies  Review of patient's allergies indicates no known allergies.  Home Medications  Prior to Admission medications   Medication Sig Start Date End Date Taking? Authorizing Provider  aspirin 81 MG tablet Take 81 mg by mouth daily.     Historical Provider, MD  atorvastatin (LIPITOR) 40 MG tablet Take 1 tablet (40 mg total) by mouth daily. 09/18/15   Troy Sine, MD  carvedilol (COREG) 25 MG tablet Take 25 mg by mouth 2 (two) times daily with a meal.      Historical Provider, MD  clopidogrel (PLAVIX) 75 MG tablet Take 75 mg by mouth daily at 3 pm.     Historical Provider, MD  ferrous sulfate 325 (65 FE) MG  tablet Take 325 mg by mouth 3 (three) times daily with meals.     Historical Provider, MD  furosemide (LASIX) 20 MG tablet Take 40 mg by mouth daily.     Historical Provider, MD  isosorbide mononitrate (IMDUR) 60 MG 24 hr tablet TAKE 2 TABLETS BY MOUTH EVERY MORNING AND 1 TABLET IN THE EVENING Patient taking differently: Take 30-60 mg by mouth 2 (two) times daily. TAKE 1 tablet in the morning Take 0.5 tablet in the evening 09/16/15   Troy Sine, MD  lubiprostone (AMITIZA) 24 MCG capsule Take 24 mcg by mouth daily as needed. For bowel movement    Historical Provider, MD  Menthol, Topical Analgesic, (ICY HOT EX) Apply 1 application topically daily as needed (aches and pains).    Historical Provider, MD  NITROSTAT 0.4 MG SL tablet PLACE 1 TABLET UNDER THE TONGUE AS NEEDED. 05/14/15   Troy Sine, MD  pantoprazole (PROTONIX) 40 MG tablet Take 40 mg by mouth 2 (two) times daily.    Historical Provider, MD  predniSONE (DELTASONE) 20 MG tablet 2 tabs po daily x 4 days 10/02/15   Deno Etienne, DO  ranolazine (RANEXA) 1000 MG SR tablet Take 1 tablet (1,000 mg total) by mouth 2 (two) times daily. 11/27/15   Troy Sine, MD  tamsulosin (FLOMAX) 0.4 MG CAPS capsule Take 0.4 mg by mouth daily. 02/26/15   Historical Provider, MD   BP 183/85 mmHg  Pulse 59  Temp(Src) 98.3 F (36.8 C) (Oral)  Resp 16  SpO2 98% Physical Exam  Constitutional: He appears well-developed and well-nourished.  HENT:  Head: Normocephalic and atraumatic.  Eyes: Conjunctivae are normal. Pupils are equal, round, and reactive to light.  Neck: Neck supple. No tracheal deviation present. No thyromegaly present.  Cardiovascular: Normal rate and regular rhythm.   No murmur heard. Pulmonary/Chest: Effort normal. He has wheezes.  Abdominal: Soft. Bowel sounds are normal. He exhibits no distension. There is no tenderness.  Musculoskeletal: Normal range of motion. He exhibits edema. He exhibits no tenderness.  Trace pretibial pitting  edema bilaterally  Neurological: He is alert. Coordination normal.  Skin: Skin is warm and dry. No rash noted.  Psychiatric: He has a normal mood and affect.  Nursing note and vitals reviewed.   ED Course  Procedures (including critical care time) Labs Review Labs Reviewed  BASIC METABOLIC PANEL - Abnormal; Notable for the following:    Glucose, Bld 116 (*)    Creatinine, Ser 1.40 (*)    GFR calc non Af Amer 43 (*)    GFR calc Af Amer 50 (*)    All other components within normal limits  CBC - Abnormal; Notable for the following:    RBC 3.44 (*)    Hemoglobin 10.9 (*)    HCT 32.8 (*)    Platelets 123 (*)  All other components within normal limits  PROTIME-INR - Abnormal; Notable for the following:    Prothrombin Time 15.9 (*)    All other components within normal limits  I-STAT TROPOININ, ED  I-STAT TROPOININ, ED    Imaging Review Dg Chest 2 View  11/27/2015  CLINICAL DATA:  Per patient since christmas patient has had severe sharp pain in the middle and left side of his chest and has been severely SOB, patient states he took nitroglycerin last night and it helped his sharp chest pain a little. HX pa.*comment was truncated* EXAM: CHEST  2 VIEW COMPARISON:  10/02/2015 FINDINGS: Artifact degradation on the lateral view posteriorly. Prior median sternotomy. Pacer with leads at right atrium and right ventricle. No lead discontinuity. Remote posterior seventh left rib fracture. Midline trachea. Mild cardiomegaly with a tortuous thoracic aorta. Right paratracheal soft tissue density has been present back to 12/08/2011 and is likely due to prominent great vessels. Left costophrenic angle blunting is chronic and likely due to a pleural thickening. No congestive failure. Left base scarring. Increased density projecting over the right upper lobe is felt to be due to osseous summation. IMPRESSION: No acute cardiopulmonary disease. Cardiomegaly with left base scarring and pleural thickening.  Electronically Signed   By: Abigail Miyamoto M.D.   On: 11/27/2015 16:07   I have personally reviewed and evaluated these images and lab results as part of my medical decision-making.   EKG Interpretation   Date/Time:  Wednesday November 27 2015 20:42:19 EST Ventricular Rate:  103 PR Interval:  107 QRS Duration: 168 QT Interval:  419 QTC Calculation: 548 R Axis:   -69 Text Interpretation:  Sinus or ectopic atrial tachycardia Left bundle  branch block previous tracing paced Confirmed by Cosette Prindle  MD, Annaleigh Steinmeyer  ([id number]) on 11/27/2015 9:03:27 PM     2050 p.m. patient complained of anterior chest pain. Sublingual nitroglycerin and baby aspirin ordered Chest x-ray reviewed by me  10:15 PM patient asymptomatic after treatment with 3 subungual nitroglycerin and nitroglycerin paste placed topically. Patient also administered aspirin. Chest x-ray viewed by me Results for orders placed or performed during the hospital encounter of [id number]  Basic metabolic panel  Result Value Ref Range   Sodium 138 135 - 145 mmol/L   Potassium 4.1 3.5 - 5.1 mmol/L   Chloride 101 101 - 111 mmol/L   CO2 26 22 - 32 mmol/L   Glucose, Bld 116 (H) 65 - 99 mg/dL   BUN 15 6 - 20 mg/dL   Creatinine, Ser 1.40 (H) 0.61 - 1.24 mg/dL   Calcium 8.9 8.9 - 10.3 mg/dL   GFR calc non Af Amer 43 (L) >60 mL/min   GFR calc Af Amer 50 (L) >60 mL/min   Anion gap 11 5 - 15  CBC  Result Value Ref Range   WBC 4.2 4.0 - 10.5 K/uL   RBC 3.44 (L) 4.22 - 5.81 MIL/uL   Hemoglobin 10.9 (L) 13.0 - 17.0 g/dL   HCT 32.8 (L) 39.0 - 52.0 %   MCV 95.3 78.0 - 100.0 fL   MCH 31.7 26.0 - 34.0 pg   MCHC 33.2 30.0 - 36.0 g/dL   RDW 14.5 11.5 - 15.5 %   Platelets 123 (L) 150 - 400 K/uL  Protime-INR - (order if Patient is taking Coumadin / Warfarin)  Result Value Ref Range   Prothrombin Time 15.9 (H) 11.6 - 15.2 seconds   INR 1.26 0.00 - 1.49  I-stat troponin, ED (not at [facility])  Result Value Ref Range   Troponin i, poc 0.02 0.00 - 0.08  ng/mL   Comment 3          I-Stat Troponin, ED (not at [facility])  Result Value Ref Range   Troponin i, poc 0.01 0.00 - 0.08 ng/mL   Comment 3           Dg Chest 2 View  11/27/2015  CLINICAL DATA:  Per patient since christmas patient has had severe sharp pain in the middle and left side of his chest and has been severely SOB, patient states he took nitroglycerin last night and it helped his sharp chest pain a little. HX pa.*comment was truncated* EXAM: CHEST  2 VIEW COMPARISON:  10/02/2015 FINDINGS: Artifact degradation on the lateral view posteriorly. Prior median sternotomy. Pacer with leads at right atrium and right ventricle. No lead discontinuity. Remote posterior seventh left rib fracture. Midline trachea. Mild cardiomegaly with a tortuous thoracic aorta. Right paratracheal soft tissue density has been present back to 12/08/2011 and is likely due to prominent great vessels. Left costophrenic angle blunting is chronic and likely due to a pleural thickening. No congestive failure. Left base scarring. Increased density projecting over the right upper lobe is felt to be due to osseous summation. IMPRESSION: No acute cardiopulmonary disease. Cardiomegaly with left base scarring and pleural thickening. Electronically Signed   By: Abigail Miyamoto M.D.   On: 11/27/2015 16:07    MDM  I discussed case with Dr. Philbert Riser from cardiology. He requests hospitalist service to admit patient. I consulted Dr.Doutova who will arrange for inpatient stay Final diagnoses:  None   Diagnosis #1angina pectoris #2renal insufficiency    Orlie Dakin, MD 11/27/15 2243

## 2015-11-28 ENCOUNTER — Observation Stay (HOSPITAL_COMMUNITY): Payer: Medicare HMO

## 2015-11-28 DIAGNOSIS — I5032 Chronic diastolic (congestive) heart failure: Secondary | ICD-10-CM | POA: Diagnosis not present

## 2015-11-28 DIAGNOSIS — I130 Hypertensive heart and chronic kidney disease with heart failure and stage 1 through stage 4 chronic kidney disease, or unspecified chronic kidney disease: Secondary | ICD-10-CM | POA: Diagnosis present

## 2015-11-28 DIAGNOSIS — I209 Angina pectoris, unspecified: Secondary | ICD-10-CM | POA: Diagnosis present

## 2015-11-28 DIAGNOSIS — J45909 Unspecified asthma, uncomplicated: Secondary | ICD-10-CM | POA: Diagnosis present

## 2015-11-28 DIAGNOSIS — N182 Chronic kidney disease, stage 2 (mild): Secondary | ICD-10-CM | POA: Diagnosis not present

## 2015-11-28 DIAGNOSIS — D509 Iron deficiency anemia, unspecified: Secondary | ICD-10-CM | POA: Diagnosis present

## 2015-11-28 DIAGNOSIS — I10 Essential (primary) hypertension: Secondary | ICD-10-CM | POA: Diagnosis not present

## 2015-11-28 DIAGNOSIS — N183 Chronic kidney disease, stage 3 (moderate): Secondary | ICD-10-CM | POA: Diagnosis present

## 2015-11-28 DIAGNOSIS — I2510 Atherosclerotic heart disease of native coronary artery without angina pectoris: Secondary | ICD-10-CM | POA: Diagnosis not present

## 2015-11-28 DIAGNOSIS — Z8249 Family history of ischemic heart disease and other diseases of the circulatory system: Secondary | ICD-10-CM | POA: Diagnosis not present

## 2015-11-28 DIAGNOSIS — I495 Sick sinus syndrome: Secondary | ICD-10-CM | POA: Diagnosis present

## 2015-11-28 DIAGNOSIS — T82855A Stenosis of coronary artery stent, initial encounter: Secondary | ICD-10-CM | POA: Diagnosis present

## 2015-11-28 DIAGNOSIS — I480 Paroxysmal atrial fibrillation: Secondary | ICD-10-CM

## 2015-11-28 DIAGNOSIS — I509 Heart failure, unspecified: Secondary | ICD-10-CM

## 2015-11-28 DIAGNOSIS — E785 Hyperlipidemia, unspecified: Secondary | ICD-10-CM | POA: Diagnosis present

## 2015-11-28 DIAGNOSIS — I5043 Acute on chronic combined systolic (congestive) and diastolic (congestive) heart failure: Secondary | ICD-10-CM | POA: Diagnosis present

## 2015-11-28 DIAGNOSIS — J441 Chronic obstructive pulmonary disease with (acute) exacerbation: Secondary | ICD-10-CM | POA: Diagnosis present

## 2015-11-28 DIAGNOSIS — Z87891 Personal history of nicotine dependence: Secondary | ICD-10-CM | POA: Diagnosis not present

## 2015-11-28 DIAGNOSIS — Z950 Presence of cardiac pacemaker: Secondary | ICD-10-CM | POA: Diagnosis not present

## 2015-11-28 DIAGNOSIS — Z7982 Long term (current) use of aspirin: Secondary | ICD-10-CM | POA: Diagnosis not present

## 2015-11-28 DIAGNOSIS — I447 Left bundle-branch block, unspecified: Secondary | ICD-10-CM | POA: Diagnosis present

## 2015-11-28 DIAGNOSIS — Z951 Presence of aortocoronary bypass graft: Secondary | ICD-10-CM | POA: Diagnosis not present

## 2015-11-28 DIAGNOSIS — Z79899 Other long term (current) drug therapy: Secondary | ICD-10-CM | POA: Diagnosis not present

## 2015-11-28 DIAGNOSIS — Z9849 Cataract extraction status, unspecified eye: Secondary | ICD-10-CM | POA: Diagnosis not present

## 2015-11-28 DIAGNOSIS — I25110 Atherosclerotic heart disease of native coronary artery with unstable angina pectoris: Secondary | ICD-10-CM | POA: Diagnosis present

## 2015-11-28 DIAGNOSIS — Z7902 Long term (current) use of antithrombotics/antiplatelets: Secondary | ICD-10-CM | POA: Diagnosis not present

## 2015-11-28 DIAGNOSIS — I739 Peripheral vascular disease, unspecified: Secondary | ICD-10-CM | POA: Diagnosis present

## 2015-11-28 LAB — TROPONIN I: Troponin I: <0.03 ng/mL (ref <0.031)

## 2015-11-28 MED ORDER — RANOLAZINE ER 500 MG PO TB12
1000.0000 mg | ORAL_TABLET | Freq: Two times a day (BID) | ORAL | Status: DC
  Administered 2015-11-28 – 2015-11-30 (×4): 1000 mg via ORAL
  Filled 2015-11-28 (×2): qty 2
  Filled 2015-11-28: qty 1
  Filled 2015-11-28 (×4): qty 2

## 2015-11-28 MED ORDER — ISOSORBIDE MONONITRATE ER 60 MG PO TB24
60.0000 mg | ORAL_TABLET | Freq: Every day | ORAL | Status: DC
  Administered 2015-11-28 – 2015-11-30 (×3): 60 mg via ORAL
  Filled 2015-11-28: qty 1
  Filled 2015-11-28 (×2): qty 2

## 2015-11-28 MED ORDER — PANTOPRAZOLE SODIUM 40 MG PO TBEC
40.0000 mg | DELAYED_RELEASE_TABLET | Freq: Two times a day (BID) | ORAL | Status: DC
  Administered 2015-11-28 – 2015-11-30 (×4): 40 mg via ORAL
  Filled 2015-11-28 (×5): qty 1

## 2015-11-28 MED ORDER — ISOSORBIDE MONONITRATE ER 30 MG PO TB24
30.0000 mg | ORAL_TABLET | Freq: Every day | ORAL | Status: DC
  Administered 2015-11-28 – 2015-11-29 (×2): 30 mg via ORAL
  Filled 2015-11-28 (×2): qty 1

## 2015-11-28 MED ORDER — ATORVASTATIN CALCIUM 40 MG PO TABS
40.0000 mg | ORAL_TABLET | Freq: Every day | ORAL | Status: DC
  Administered 2015-11-28: 40 mg via ORAL
  Filled 2015-11-28 (×3): qty 1

## 2015-11-28 MED ORDER — DOXYCYCLINE HYCLATE 100 MG PO TABS
100.0000 mg | ORAL_TABLET | Freq: Two times a day (BID) | ORAL | Status: DC
  Administered 2015-11-28 – 2015-11-29 (×4): 100 mg via ORAL
  Filled 2015-11-28 (×7): qty 1

## 2015-11-28 MED ORDER — FUROSEMIDE 40 MG PO TABS
40.0000 mg | ORAL_TABLET | Freq: Every day | ORAL | Status: DC
  Administered 2015-11-28: 40 mg via ORAL
  Filled 2015-11-28: qty 1

## 2015-11-28 MED ORDER — SODIUM CHLORIDE 0.9 % IJ SOLN
3.0000 mL | Freq: Two times a day (BID) | INTRAMUSCULAR | Status: DC
  Administered 2015-11-28: 3 mL via INTRAVENOUS

## 2015-11-28 MED ORDER — ONDANSETRON HCL 4 MG/2ML IJ SOLN: 4.0000 mg | Freq: Four times a day (QID) | INTRAMUSCULAR | Status: DC | PRN

## 2015-11-28 MED ORDER — BISOPROLOL FUMARATE 5 MG PO TABS
5.0000 mg | ORAL_TABLET | Freq: Every day | ORAL | Status: DC
  Administered 2015-11-28 – 2015-11-29 (×2): 5 mg via ORAL
  Filled 2015-11-28 (×3): qty 1

## 2015-11-28 MED ORDER — IPRATROPIUM BROMIDE 0.02 % IN SOLN
0.5000 mg | Freq: Four times a day (QID) | RESPIRATORY_TRACT | Status: DC
  Administered 2015-11-28 – 2015-11-29 (×5): 0.5 mg via RESPIRATORY_TRACT
  Filled 2015-11-28 (×6): qty 2.5

## 2015-11-28 MED ORDER — ENOXAPARIN SODIUM 40 MG/0.4ML ~~LOC~~ SOLN
40.0000 mg | SUBCUTANEOUS | Status: DC
  Administered 2015-11-28: 40 mg via SUBCUTANEOUS
  Filled 2015-11-28 (×2): qty 0.4

## 2015-11-28 MED ORDER — ACETAMINOPHEN 325 MG PO TABS: 650.0000 mg | ORAL_TABLET | ORAL | Status: DC | PRN

## 2015-11-28 MED ORDER — METHYLPREDNISOLONE SODIUM SUCC 125 MG IJ SOLR
60.0000 mg | Freq: Every day | INTRAMUSCULAR | Status: DC
  Administered 2015-11-28: 60 mg via INTRAVENOUS
  Filled 2015-11-28: qty 2

## 2015-11-28 MED ORDER — ASPIRIN 81 MG PO CHEW: 81.0000 mg | CHEWABLE_TABLET | ORAL | Status: DC

## 2015-11-28 MED ORDER — CLOPIDOGREL BISULFATE 75 MG PO TABS
75.0000 mg | ORAL_TABLET | Freq: Every day | ORAL | Status: DC
Start: 2015-11-28 — End: 2015-11-30
  Administered 2015-11-28 – 2015-11-30 (×3): 75 mg via ORAL
  Filled 2015-11-28 (×3): qty 1

## 2015-11-28 MED ORDER — METHYLPREDNISOLONE SODIUM SUCC 125 MG IJ SOLR
60.0000 mg | Freq: Two times a day (BID) | INTRAMUSCULAR | Status: DC
  Administered 2015-11-28 – 2015-11-30 (×4): 60 mg via INTRAVENOUS
  Filled 2015-11-28 (×4): qty 2

## 2015-11-28 MED ORDER — SODIUM CHLORIDE 0.9 % IJ SOLN: 3.0000 mL | INTRAMUSCULAR | Status: DC | PRN

## 2015-11-28 MED ORDER — SODIUM CHLORIDE 0.9 % WEIGHT BASED INFUSION
1.0000 mL/kg/h | INTRAVENOUS | Status: DC
Start: 2015-11-28 — End: 2015-11-29
  Administered 2015-11-28: 1 mL/kg/h via INTRAVENOUS

## 2015-11-28 MED ORDER — SODIUM CHLORIDE 0.9 % IV SOLN: 250.0000 mL | INTRAVENOUS | Status: DC | PRN

## 2015-11-28 MED ORDER — MORPHINE SULFATE (PF) 2 MG/ML IV SOLN: 2.0000 mg | INTRAVENOUS | Status: DC | PRN

## 2015-11-28 MED ORDER — FUROSEMIDE 10 MG/ML IJ SOLN
40.0000 mg | Freq: Two times a day (BID) | INTRAMUSCULAR | Status: DC
  Administered 2015-11-28: 40 mg via INTRAVENOUS
  Filled 2015-11-28: qty 4

## 2015-11-28 MED ORDER — TAMSULOSIN HCL 0.4 MG PO CAPS
0.4000 mg | ORAL_CAPSULE | Freq: Every day | ORAL | Status: DC
  Administered 2015-11-28 – 2015-11-30 (×2): 0.4 mg via ORAL
  Filled 2015-11-28 (×3): qty 1

## 2015-11-28 MED ORDER — ASPIRIN EC 81 MG PO TBEC
81.0000 mg | DELAYED_RELEASE_TABLET | Freq: Every day | ORAL | Status: DC
  Administered 2015-11-28 – 2015-11-30 (×3): 81 mg via ORAL
  Filled 2015-11-28 (×3): qty 1

## 2015-11-28 NOTE — Progress Notes (Signed)
ATTENDING NOTIFIED OF PATIENT'S ARRIVAL TO UNIT.

## 2015-11-28 NOTE — Progress Notes (Signed)
PROGRESS NOTE  John Peck [id number] DOB: 14-May-1924 DOA: 11/27/2015 PCP: Patricia Nettle, MD  Brief History 82 year old male with a history of hypertension, coronary artery disease, diastolic CHF, paroxysmal atrial fibrillation, sick sinus syndrome with PPM presented with 3-4 week history of worsening dyspnea on exertion and exertional chest discomfort. The patient relates shortness of breath even walking from his bedroom to his kitchen. He also has chest discomfort at rest, but appears to be precipitated by exertion as well. He denies any fevers, chills, nausea, vomiting, diarrhea, syncope. He has an occasional nonproductive cough. The patient last had heart catheterization 01/25/2014 which showed EF 45% and significant native multivessel coronary disease. It was recommended to optimize his medical therapy at that time. Assessment/Plan: Stable Angina/Coronary artery disease -Consult cardiology -01/25/2014 cath--50% ostial LAD stenosis followed by 80% proximal stenosis in the region of the first diagonal takeoff followed by 80% stenosis after the second diagonal vessel; total occlusion of the ramus intermediate vessel with evidence for bidirectional retrograde collateralization; 40% ostial left circumflex stenosis; and widely patent proximal RCA stent with 80% stenosis in the anterior RV marginal branch, 40% mid or CVA stenosis, 50% distal, and 50% ostial and mid PDA stenoses. -Presently chest pain-free -Continue bisoprolol, aspirin, Plavix, Imdur, Ranexa Acute on chronic diastolic CHF -While the patient may be mildly fluid overloaded, I don't feel that this is the primary issue -Review of the medical record shows that the patient's weight has been relatively stable over the past 6 months -Change the patient back to oral furosemide after morning dose -Suspect the patient's JVD may be related to degree of right-sided heart failure/cor pulmonale COPD exacerbation -The patient  is wheezing on examination -Approximately 30-pack-year history -Continue intravenous steroids -Continue doxycycline -Aerosolized albuterol and atrovent Atrial fibrillation -Rate controlled -Continue bisoprolol -Patient was not felt to be a good anticoagulation candidate secondary to his prior GI bleed -CHADSVASc = 5 SSS -S/p PPM 2009 -stable CKD stage 2-3  -Baseline creatinine 1.1-1.4   Family Communication:   Pt at beside Disposition Plan:   Home when medically stable       Procedures/Studies: Dg Chest 2 View  11/27/2015  CLINICAL DATA:  Per patient since christmas patient has had severe sharp pain in the middle and left side of his chest and has been severely SOB, patient states he took nitroglycerin last night and it helped his sharp chest pain a little. HX pa.*comment was truncated* EXAM: CHEST  2 VIEW COMPARISON:  10/02/2015 FINDINGS: Artifact degradation on the lateral view posteriorly. Prior median sternotomy. Pacer with leads at right atrium and right ventricle. No lead discontinuity. Remote posterior seventh left rib fracture. Midline trachea. Mild cardiomegaly with a tortuous thoracic aorta. Right paratracheal soft tissue density has been present back to 12/08/2011 and is likely due to prominent great vessels. Left costophrenic angle blunting is chronic and likely due to a pleural thickening. No congestive failure. Left base scarring. Increased density projecting over the right upper lobe is felt to be due to osseous summation. IMPRESSION: No acute cardiopulmonary disease. Cardiomegaly with left base scarring and pleural thickening. Electronically Signed   By: Abigail Miyamoto M.D.   On: 11/27/2015 16:07         Subjective: Patient denies fevers, chills, headache, chest pain, dyspnea, nausea, vomiting, diarrhea, abdominal pain, dysuria, hematuria   Objective: Filed Vitals:   11/28/15 0030 11/28/15 0111 11/28/15 0144 11/28/15 0509  BP:  177/82 171/86 155/75  Pulse: 65  64 64 61  Temp:   97.8 F (36.6 C) 98.2 F (36.8 C)  TempSrc:   Oral Oral  Resp: 24 17 21 18   Height:   5\' 8"  (1.727 m)   Weight:   77.4 kg (170 lb 10.2 oz) 77.4 kg (170 lb 10.2 oz)  SpO2: 98% 96% 96% 99%    Intake/Output Summary (Last 24 hours) at 11/28/15 0756 Last data filed at 11/28/15 0700  Gross per 24 hour  Intake    120 ml  Output   1725 ml  Net  -1605 ml   Weight change:  Exam:   General:  Pt is alert, follows commands appropriately, not in acute distress  HEENT: No icterus, No thrush, No neck mass, [place]/AT  Cardiovascular: RRR, S1/S2, no rubs, no gallops  Respiratory: Bibasilar crackles. Bibasilar wheezing.  Abdomen: Soft/+BS, non tender, non distended, no guarding; no hepatosplenomegaly  Extremities: trace LE  edema, No lymphangitis, No petechiae, No rashes, no synovitis; no cyanosis  Data Reviewed: Basic Metabolic Panel:  Recent Labs Lab 11/27/15 1516  NA 138  K 4.1  CL 101  CO2 26  GLUCOSE 116*  BUN 15  CREATININE 1.40*  CALCIUM 8.9   Liver Function Tests: No results for input(s): AST, ALT, ALKPHOS, BILITOT, PROT, ALBUMIN in the last 168 hours. No results for input(s): LIPASE, AMYLASE in the last 168 hours. No results for input(s): AMMONIA in the last 168 hours. CBC:  Recent Labs Lab 11/27/15 1516  WBC 4.2  HGB 10.9*  HCT 32.8*  MCV 95.3  PLT 123*   Cardiac Enzymes:  Recent Labs Lab 11/28/15 0305  TROPONINI <0.03   BNP: Invalid input(s): POCBNP CBG: No results for input(s): GLUCAP in the last 168 hours.  No results found for this or any previous visit (from the past 240 hour(s)).   Scheduled Meds: . aspirin EC  81 mg Oral Daily  . atorvastatin  40 mg Oral Daily  . bisoprolol  5 mg Oral Daily  . clopidogrel  75 mg Oral Daily  . doxycycline  100 mg Oral Q12H  . enoxaparin (LOVENOX) injection  40 mg Subcutaneous Q24H  . furosemide  40 mg Intravenous BID  . ipratropium  0.5 mg Nebulization Q6H  . isosorbide mononitrate  30  mg Oral QHS  . isosorbide mononitrate  60 mg Oral Daily  . methylPREDNISolone (SOLU-MEDROL) injection  60 mg Intravenous Q0600  . pantoprazole  40 mg Oral BID  . ranolazine  1,000 mg Oral BID  . tamsulosin  0.4 mg Oral Daily   Continuous Infusions:    Linsay Vogt, DO  [facility] Pager [phone]  If 7PM-7AM, please contact night-coverage [email or web address] Password [facility] 11/28/2015, 7:56 AM

## 2015-11-28 NOTE — Care Management Note (Signed)
Case Management Note  Patient Details  Name: LAJOHN CROES MRN: [id number] Date of Birth: 11-29-1923  Subjective/Objective:   Pt admitted with chronic diastolic CHF                 Action/Plan:  Pt is from home with wife, pt uses cane for assistance with ambulation at home.  Pt stated that he sometimes forgets how to arrange his medications for daily use, CM requested [facility] for medication management from attending.   CM assessed pt for HF screening; pt agreed to weigh self daily and adhere to low sodium diet. CM will continue to monitor for diposition needs   Expected Discharge Date:  11/29/15               Expected Discharge Plan:  [place]  In-House Referral:     Discharge planning Services  CM Consult  Post Acute Care Choice:    Choice offered to:     DME Arranged:    DME Agency:     HH Arranged:    HH Agency:     Status of Service:  In process, will continue to follow  Medicare Important Message Given:    Date Medicare IM Given:    Medicare IM give by:    Date Additional Medicare IM Given:    Additional Medicare Important Message give by:     If discussed at [place] of Stay Meetings, dates discussed:    Additional Comments:  [name], RN 11/28/2015, 11:45 AM

## 2015-11-28 NOTE — Progress Notes (Signed)
  Echocardiogram 2D Echocardiogram has been performed.  John Peck 11/28/2015, 10:53 AM

## 2015-11-28 NOTE — Progress Notes (Signed)
PATIENT ARRIVED TO UNIT 2W FROM E.D. VIA STRETCHER. AMBULATED TO BED WITH ONE-PERSON ASSISTANCE. TELE APPLIED. VITALS OBTAINED. ASSESSMENT COMPLETED.  PATIENT ORIENTED TO UNIT AND EQUIPMENT. INSTRUCTED TO CALL FOR ASSISTANCE WHEN NEEDED.  BELONGINGS AND CALL BELL WITHIN  REACH.

## 2015-11-28 NOTE — Consult Note (Signed)
Patient ID: John Peck MRN: [id number], DOB/AGE: [birth year]   Admit date: 11/27/2015   Primary Physician: Patricia Nettle, MD Primary Cardiologist: Dr. Claiborne Billings EP: Dr. Lovena Le  Reason for Consult: Chest Pain Consulting MD: Dr. Carles Collet (Internal Medicine)  Pt. Profile:  82 y/o male with h/o CAD s/p CABG in 1989 and RCA PCI in 2004, PAF, SSS s/p PPM in 2009, HTN, COPD, IDA, BPH, status post TURP in 1992 and remote history of liver abscess, admitted for chest pain evaluation.   Problem List  Past Medical History  Diagnosis Date  . Hypertension   . Pacemaker Sept 2009    St Jude  . CAD (coronary artery disease)     CABG '89, PCI '04  . Intermediate coronary syndrome ([place])   . Arrhythmia     afib, bradycardia  . Diastolic dysfunction   . Anemia   . Sick sinus syndrome [facility]) Sept 2009    ST Jude PTVDP  . History of COPD   . H/O: GI bleed     REMOTE HISTORY  . Hyperlipidemia   . CHF (congestive heart failure) ([place])     patient states he has a pacemaker  . PAF (paroxysmal atrial fibrillation) ([place])   . CKD (chronic kidney disease), stage II     Past Surgical History  Procedure Laterality Date  . Coronary artery bypass graft  1989    had LIMA to his LAD, a vein to the circumflex. In 2004 underwent stenting to his right coronary artery.  . Cardiac catheterization  11/2011    EF 50%; significant native CAD w/80% stenosis of the LAD after 2nd giagonal vessel and septal perforating artery w/total occlusion of the mid left anterior descendiung.; 60-70% ostiasl stenosis in the circumflex vessel followed by 40% proximal stenosis and 70-80% distal circumflex stenosis;   . Cataract extraction  2004  . Prostatectomy    . Hemorrhoid surgery    . Insert / replace / remove pacemaker  07/17/08    DUAL-CHAMBER; PPM-ST.JUDE MEDNET  . Coronary angioplasty with stent placement  2004    RCA  . Left heart catheterization with coronary/graft angiogram N/A 12/10/2011    Procedure: LEFT HEART  CATHETERIZATION WITH Beatrix Fetters;  Surgeon: Troy Sine, MD;  Location: [facility] CATH LAB;  Service: Cardiovascular;  Laterality: N/A;  . Left heart catheterization with coronary/graft angiogram N/A 01/26/2014    Procedure: LEFT HEART CATHETERIZATION WITH Beatrix Fetters;  Surgeon: Troy Sine, MD;  Location: [facility] CATH LAB;  Service: Cardiovascular;  Laterality: N/A;     Allergies  No Known Allergies  HPI  82 y/o male, followed by Dr. Claiborne Billings, with h/o CAD s/p CABG in 1989 and RCA PCI in 2004, PAF, SSS s/p PPM in 2009, HTN, PVD, COPD, IDA, BPH, status post TURP in 1992 and remote history of liver abscess, admitted for chest pain evaluation.   His most recent cath in March 2015, performed due to recurrent symptoms of increasing CP. The study showed mild LV dysfunction with an EF of 45% inferior wall hypocontractility. There was significant multivessel native coronary obstructive disease with 50% ostial LAD stenosis followed by 80% proximal stenosis in the region of the first diagonal takeoff followed by 80% stenosis after the second diagonal vessel; total occlusion of the ramus intermediate vessel with evidence for bidirectional retrograde collateralization; 40% ostial left circumflex stenosis; and widely patent proximal RCA stent with 80% stenosis in the anterior RV marginal branch, 40% mid or CVA stenosis, 50% distal, and 50%  ostial and mid PDA stenoses. He had a patent LIMA graft supplying the mid LAD and an occluded Y graft which previously supplied the intermediate and distal circumflex.Medical therapy was elected. He has been on increased medical regimen consisting of aspirin, Plavix, carvedilol 25 mg twice a day, high dose oral nitrate therapy, 60 mg of Imdur, along with Ranexa 1000 mg twice a day. He also is taking Lasix 20 mg with supplemental potassium.He was on amlodipine in the past but this was discontinued due to low BP. His PPM is followed by Dr. Lovena Le. He was last  seen by Dr. Claiborne Billings in 08/2015 and was felt to be stable from a cardiac standpoint, denying any recurrent symptoms.   [facility], 11/27/15, with complaint of a 2 month h/o progressive dyspnea on exertion as well as resting chest pain, that is also exacerbated by exertion. Symptoms are consistent with his prior angina and relieved with SL NTG. He is currently CP free.   EKG shows LBBB, also seen on EKGs in the past. Troponin's are negative x 2.   Home Medications  Prior to Admission medications   Medication Sig Start Date End Date Taking? Authorizing Provider  aspirin 81 MG tablet Take 81 mg by mouth daily.    Yes Historical Provider, MD  atorvastatin (LIPITOR) 40 MG tablet Take 1 tablet (40 mg total) by mouth daily. 09/18/15  Yes Troy Sine, MD  carvedilol (COREG) 25 MG tablet Take 25 mg by mouth 2 (two) times daily with a meal.     Yes Historical Provider, MD  clopidogrel (PLAVIX) 75 MG tablet Take 75 mg by mouth daily at 3 pm.    Yes Historical Provider, MD  ferrous sulfate 325 (65 FE) MG tablet Take 325 mg by mouth 3 (three) times daily with meals.    Yes Historical Provider, MD  furosemide (LASIX) 20 MG tablet Take 40 mg by mouth daily.    Yes Historical Provider, MD  isosorbide mononitrate (IMDUR) 60 MG 24 hr tablet TAKE 2 TABLETS BY MOUTH EVERY MORNING AND 1 TABLET IN THE EVENING Patient taking differently: Take 30-60 mg by mouth 2 (two) times daily. TAKE 1 tablet in the morning Take 0.5 tablet in the evening 09/16/15  Yes Troy Sine, MD  lubiprostone (AMITIZA) 24 MCG capsule Take 24 mcg by mouth daily as needed. For bowel movement   Yes Historical Provider, MD  Menthol, Topical Analgesic, (ICY HOT EX) Apply 1 application topically daily as needed (aches and pains).   Yes Historical Provider, MD  NITROSTAT 0.4 MG SL tablet PLACE 1 TABLET UNDER THE TONGUE AS NEEDED. 05/14/15  Yes Troy Sine, MD  pantoprazole (PROTONIX) 40 MG tablet Take 40 mg by mouth 2 (two) times  daily.   Yes Historical Provider, MD  ranolazine (RANEXA) 1000 MG SR tablet Take 1 tablet (1,000 mg total) by mouth 2 (two) times daily. 11/27/15  Yes Troy Sine, MD  tamsulosin (FLOMAX) 0.4 MG CAPS capsule Take 0.4 mg by mouth daily. 02/26/15  Yes Historical Provider, MD  predniSONE (DELTASONE) 20 MG tablet 2 tabs po daily x 4 days 10/02/15   Deno Etienne, DO    Family History  Family History  Problem Relation Age of Onset  . Diabetes Father   . Heart disease Sister   . Heart disease Brother     Social History  Social History   Social History  . Marital Status: Married    Spouse Name: N/A  .  Number of Children: 5  . Years of Education: N/A   Occupational History  . RETIRED     TRUCK DRIVER   Social History Main Topics  . Smoking status: Former Smoker -- 2.00 packs/day for 65 years    Types: Cigarettes  . Smokeless tobacco: Former Systems developer    Quit date: 10/29/2008  . Alcohol Use: No  . Drug Use: No  . Sexual Activity: Not on file   Other Topics Concern  . Not on file   Social History Narrative     Review of Systems General:  No chills, fever, night sweats or weight changes.  Cardiovascular:  No chest pain, dyspnea on exertion, edema, orthopnea, palpitations, paroxysmal nocturnal dyspnea. Dermatological: No rash, lesions/masses Respiratory: No cough, dyspnea Urologic: No hematuria, dysuria Abdominal:   No nausea, vomiting, diarrhea, bright red blood per rectum, melena, or hematemesis Neurologic:  No visual changes, wkns, changes in mental status. All other systems reviewed and are otherwise negative except as noted above.  Physical Exam  Blood pressure 155/75, pulse 61, temperature 98.2 F (36.8 C), temperature source Oral, resp. rate 18, height 5\' 8"  (1.727 m), weight 170 lb 10.2 oz (77.4 kg), SpO2 99 %.  General: Pleasant, NAD Psych: Normal affect. Neuro: Alert and oriented X 3. Moves all extremities spontaneously. HEENT: Normal  Neck: Supple without bruits  or JVD. Lungs:  Resp regular and unlabored, decreased BS at the bases. Diffuse, mild expiratory wheezing bilaterally.  Heart: RRR no s3, s4, or murmurs. Abdomen: Soft, non-tender, non-distended, BS + x 4.  Extremities: No clubbing or cyanosis. Trace LE edema bilaterally. DP/PT/Radials 2+ and equal bilaterally.  Labs  Troponin [facility] Test)  Recent Labs  11/27/15 2009  TROPIPOC 0.01    Recent Labs  11/28/15 0305 11/28/15 0807  TROPONINI <0.03 <0.03   Lab Results  Component Value Date   WBC 4.2 11/27/2015   HGB 10.9* 11/27/2015   HCT 32.8* 11/27/2015   MCV 95.3 11/27/2015   PLT 123* 11/27/2015    Recent Labs Lab 11/27/15 1516  NA 138  K 4.1  CL 101  CO2 26  BUN 15  CREATININE 1.40*  CALCIUM 8.9  GLUCOSE 116*   Lab Results  Component Value Date   CHOL 149 11/01/2015   HDL 44 11/01/2015   LDLCALC 87 11/01/2015   TRIG 90 11/01/2015   No results found for: DDIMER   Radiology/Studies  Dg Chest 2 View  11/27/2015  CLINICAL DATA:  Per patient since christmas patient has had severe sharp pain in the middle and left side of his chest and has been severely SOB, patient states he took nitroglycerin last night and it helped his sharp chest pain a little. HX pa.*comment was truncated* EXAM: CHEST  2 VIEW COMPARISON:  10/02/2015 FINDINGS: Artifact degradation on the lateral view posteriorly. Prior median sternotomy. Pacer with leads at right atrium and right ventricle. No lead discontinuity. Remote posterior seventh left rib fracture. Midline trachea. Mild cardiomegaly with a tortuous thoracic aorta. Right paratracheal soft tissue density has been present back to 12/08/2011 and is likely due to prominent great vessels. Left costophrenic angle blunting is chronic and likely due to a pleural thickening. No congestive failure. Left base scarring. Increased density projecting over the right upper lobe is felt to be due to osseous summation. IMPRESSION: No acute  cardiopulmonary disease. Cardiomegaly with left base scarring and pleural thickening. Electronically Signed   By: Abigail Miyamoto M.D.   On: 11/27/2015 16:07  ECG LBBB 103 bpm  Echocardiogram  Results pending    ASSESSMENT AND PLAN  Active Problems:   Essential hypertension   PACEMAKER, PERMANENT- St Jude Sept 2009   History of iron deficiency anemia   PAF (paroxysmal atrial fibrillation) ([facility])   Chronic diastolic CHF (congestive heart failure) ([place])   CAD in native artery   Chest pain   Reactive airway disease   Angina pectoris ([place])   1. CAD/Angina: patient with h/o remote CABG in 1989 and RCA PCI in 2004. His last LHC in March 2015 showed significant multivessel native coronary obstructive disease with 50% ostial LAD stenosis followed by 80% proximal stenosis in the region of the first diagonal takeoff followed by 80% stenosis after the second diagonal vessel; total occlusion of the ramus intermediate vessel with evidence for bidirectional retrograde collateralization; 40% ostial left circumflex stenosis; and widely patent proximal RCA stent with 80% stenosis in the anterior RV marginal branch, 40% mid or CVA stenosis, 50% distal, and 50% ostial and mid PDA stenoses. He had a patent LIMA graft supplying the mid LAD and an occluded Y graft which previously supplied the intermediate and distal circumflex.Medical therapy was elected. He has done well on medical therapy until recently. He has been on 25 mg of Coreg BID, 60 mg of Imdur daily and 1,000 mg of Ranexa BID. Also on ASA, Plavix and Lipitor. He was intolerant to amlodipine in the past due to soft blood pressure. He has had recurrent anginal symptoms for the last several weeks. Cardiac enzymes are negative x 2. EKG shows LBBB, also seen on prior EKGs. 2D Echo is pending. Agree with optimizing medical therapy for now. His BP has been moderately elevated since admission. Agree with increasing Imdur frequency to BID. 60 mg in the am and  30 mg at night. Continue Ranexa 1,000 mg BID. This is the max dose. His BB was changed from Coreg to cardioselective bisoprolol given acute COPD exacerbation.  MD to decide on cath. This may be dependent on echo results. We will follow along.   2. Atrial Fibrillation: rate is controlled on BB. Despite CHA2DS2 VASc score of 5, patient not a candidate for a/c given prior h/o GIB.   3. SSS: s/p PMM followed by Dr. Lovena Le.   4. Acute on Chronic Systolic CHF: BNP 123XX123. Trace LEE on exam. 2D echo pending. Continue lasix. Monitor I/Os, daily weights. Low sodium diet.   5. Acute COPD Exacerbation: management per IM.    Signed, Lyda Jester, PA-C 11/28/2015, 9:48 AM

## 2015-11-29 ENCOUNTER — Encounter (HOSPITAL_COMMUNITY)
Admission: EM | Disposition: A | Discharge status: Home Health | Payer: Self-pay | Source: Home / Self Care | Attending: Internal Medicine

## 2015-11-29 DIAGNOSIS — I25110 Atherosclerotic heart disease of native coronary artery with unstable angina pectoris: Secondary | ICD-10-CM

## 2015-11-29 HISTORY — PX: CARDIAC CATHETERIZATION: SHX172

## 2015-11-29 LAB — CBC
HCT: 32.1 % — ABNORMAL LOW (ref 39.0–52.0)
HEMOGLOBIN: 10.5 g/dL — AB (ref 13.0–17.0)
MCH: 31.2 pg (ref 26.0–34.0)
MCHC: 32.7 g/dL (ref 30.0–36.0)
MCV: 95.3 fL (ref 78.0–100.0)
Platelets: 137 10*3/uL — ABNORMAL LOW (ref 150–400)
RBC: 3.37 MIL/uL — AB (ref 4.22–5.81)
RDW: 14.3 % (ref 11.5–15.5)
WBC: 5.8 10*3/uL (ref 4.0–10.5)

## 2015-11-29 LAB — BASIC METABOLIC PANEL
Anion gap: 11 (ref 5–15)
BUN: 20 mg/dL (ref 6–20)
CHLORIDE: 101 mmol/L (ref 101–111)
CO2: 26 mmol/L (ref 22–32)
CREATININE: 1.25 mg/dL — AB (ref 0.61–1.24)
Calcium: 8.7 mg/dL — ABNORMAL LOW (ref 8.9–10.3)
GFR calc Af Amer: 57 mL/min — ABNORMAL LOW (ref >60)
GFR, EST NON AFRICAN AMERICAN: 49 mL/min — AB (ref >60)
GLUCOSE: 141 mg/dL — AB (ref 65–99)
Potassium: 3.8 mmol/L (ref 3.5–5.1)
SODIUM: 138 mmol/L (ref 135–145)

## 2015-11-29 LAB — GLUCOSE, CAPILLARY: GLUCOSE-CAPILLARY: 129 mg/dL — AB (ref 65–99)

## 2015-11-29 LAB — POCT ACTIVATED CLOTTING TIME: ACTIVATED CLOTTING TIME: 590 s

## 2015-11-29 LAB — PROTIME-INR
INR: 1.4 (ref 0.00–1.49)
PROTHROMBIN TIME: 17.3 s — AB (ref 11.6–15.2)

## 2015-11-29 SURGERY — LEFT HEART CATH AND CORS/GRAFTS ANGIOGRAPHY
Anesthesia: LOCAL

## 2015-11-29 MED ORDER — BIVALIRUDIN BOLUS VIA INFUSION - CUPID
INTRAVENOUS | Status: DC | PRN
  Administered 2015-11-29: 57.3 mg via INTRAVENOUS

## 2015-11-29 MED ORDER — SODIUM CHLORIDE 0.9 % IV SOLN
250.0000 mg | INTRAVENOUS | Status: DC | PRN
  Administered 2015-11-29: 1.75 mg/kg/h via INTRAVENOUS

## 2015-11-29 MED ORDER — HYDRALAZINE HCL 20 MG/ML IJ SOLN
10.0000 mg | Freq: Once | INTRAMUSCULAR | Status: AC
  Administered 2015-11-29: 10 mg via INTRAVENOUS
  Filled 2015-11-29: qty 1

## 2015-11-29 MED ORDER — SODIUM CHLORIDE 0.9 % IV SOLN: INTRAVENOUS | Status: AC

## 2015-11-29 MED ORDER — LIDOCAINE HCL (PF) 1 % IJ SOLN
INTRAMUSCULAR | Status: DC | PRN
  Administered 2015-11-29: 14:00:00

## 2015-11-29 MED ORDER — SODIUM CHLORIDE 0.9 % IJ SOLN: 3.0000 mL | Freq: Two times a day (BID) | INTRAMUSCULAR | Status: DC

## 2015-11-29 MED ORDER — BIVALIRUDIN 250 MG IV SOLR
INTRAVENOUS | Status: AC
  Filled 2015-11-29: qty 250

## 2015-11-29 MED ORDER — LIDOCAINE HCL (PF) 1 % IJ SOLN
INTRAMUSCULAR | Status: AC
  Filled 2015-11-29: qty 30

## 2015-11-29 MED ORDER — IOHEXOL 350 MG/ML SOLN
INTRAVENOUS | Status: DC | PRN
  Administered 2015-11-29: 155 mL via INTRA_ARTERIAL

## 2015-11-29 MED ORDER — SODIUM CHLORIDE 0.9 % IJ SOLN
3.0000 mL | INTRAMUSCULAR | Status: DC | PRN
Start: 2015-11-29 — End: 2015-11-30

## 2015-11-29 MED ORDER — CLOPIDOGREL BISULFATE 300 MG PO TABS
ORAL_TABLET | ORAL | Status: AC
  Filled 2015-11-29: qty 1

## 2015-11-29 MED ORDER — MIDAZOLAM HCL 2 MG/2ML IJ SOLN
INTRAMUSCULAR | Status: DC | PRN
  Administered 2015-11-29: 1 mg via INTRAVENOUS

## 2015-11-29 MED ORDER — CLOPIDOGREL BISULFATE 300 MG PO TABS
ORAL_TABLET | ORAL | Status: DC | PRN
  Administered 2015-11-29: 300 mg via ORAL

## 2015-11-29 MED ORDER — HEPARIN (PORCINE) IN NACL 2-0.9 UNIT/ML-% IJ SOLN
INTRAMUSCULAR | Status: AC
  Filled 2015-11-29: qty 1000

## 2015-11-29 MED ORDER — MIDAZOLAM HCL 2 MG/2ML IJ SOLN
INTRAMUSCULAR | Status: AC
  Filled 2015-11-29: qty 2

## 2015-11-29 MED ORDER — NITROGLYCERIN 0.4 MG SL SUBL
0.4000 mg | SUBLINGUAL_TABLET | SUBLINGUAL | Status: DC | PRN
  Administered 2015-11-29: 0.4 mg via SUBLINGUAL
  Filled 2015-11-29: qty 1

## 2015-11-29 MED ORDER — IPRATROPIUM BROMIDE 0.02 % IN SOLN: 0.5000 mg | RESPIRATORY_TRACT | Status: DC | PRN

## 2015-11-29 MED ORDER — SODIUM CHLORIDE 0.9 % IV SOLN: 250.0000 mL | INTRAVENOUS | Status: DC | PRN

## 2015-11-29 SURGICAL SUPPLY — 22 items
BALLN EMERGE MR 2.5X8 (BALLOONS) ×2
BALLN ~~LOC~~ EMERGE MR 3.25X6 (BALLOONS) ×2
BALLOON EMERGE MR 2.5X8 (BALLOONS) IMPLANT
BALLOON ~~LOC~~ EMERGE MR 3.25X6 (BALLOONS) IMPLANT
CATH INFINITI 5 FR IM (CATHETERS) ×1 IMPLANT
CATH INFINITI 5FR MULTPACK ANG (CATHETERS) ×1 IMPLANT
CATH VISTA GUIDE 6FR JR4 (CATHETERS) ×1 IMPLANT
GUIDE CATH RUNWAY 6FR AL 1 (CATHETERS) ×1 IMPLANT
GUIDE CATH RUNWAY 6FR AR1 (CATHETERS) ×1 IMPLANT
GUIDEWIRE ANGLED .035X150CM (WIRE) ×1 IMPLANT
KIT ENCORE 26 ADVANTAGE (KITS) ×1 IMPLANT
KIT HEART LEFT (KITS) ×2 IMPLANT
PACK CARDIAC CATHETERIZATION (CUSTOM PROCEDURE TRAY) ×2 IMPLANT
SHEATH PINNACLE 5F 10CM (SHEATH) ×1 IMPLANT
SHEATH PINNACLE 6F 10CM (SHEATH) ×1 IMPLANT
STENT SYNERGY DES 3X12 (Permanent Stent) ×1 IMPLANT
SYR MEDRAD MARK V 150ML (SYRINGE) ×2 IMPLANT
TRANSDUCER W/STOPCOCK (MISCELLANEOUS) ×2 IMPLANT
TUBING CIL FLEX 10 FLL-RA (TUBING) ×1 IMPLANT
WIRE COUGAR XT STRL 190CM (WIRE) ×1 IMPLANT
WIRE EMERALD 3MM-J .035X150CM (WIRE) ×1 IMPLANT
WIRE HI TORQ WHISPER MS 190CM (WIRE) ×1 IMPLANT

## 2015-11-29 NOTE — Progress Notes (Signed)
SUBJECTIVE:  No further CP  OBJECTIVE:   Vitals:   Filed Vitals:   11/28/15 1929 11/28/15 2212 11/29/15 0155 11/29/15 0348  BP:  150/63  153/66  Pulse: 64 60 59 67  Temp:  98.6 F (37 C)  98.2 F (36.8 C)  TempSrc:  Oral  Oral  Resp: 18 18 17 18   Height:      Weight:    168 lb 6.9 oz (76.4 kg)  SpO2: 98% 100% 98% 98%   I&O's:   Intake/Output Summary (Last 24 hours) at 11/29/15 0801 Last data filed at 11/29/15 0650  Gross per 24 hour  Intake    240 ml  Output    875 ml  Net   -635 ml   TELEMETRY: Reviewed telemetry pt in NSR:     PHYSICAL EXAM General: Well developed, well nourished, in no acute distress Head: Eyes PERRLA, No xanthomas.   Normal cephalic and atramatic  Lungs:   Clear bilaterally to auscultation and percussion. Heart:   HRRR S1 S2 Pulses are 2+ & equal. Abdomen: Bowel sounds are positive, abdomen soft and non-tender without masses  Extremities:   No clubbing, cyanosis or edema.  DP +1 Neuro: Alert and oriented X 3. Psych:  Good affect, responds appropriately   LABS: Basic Metabolic Panel:  Recent Labs  11/27/15 1516 11/29/15 0327  NA 138 138  K 4.1 3.8  CL 101 101  CO2 26 26  GLUCOSE 116* 141*  BUN 15 20  CREATININE 1.40* 1.25*  CALCIUM 8.9 8.7*   Liver Function Tests: No results for input(s): AST, ALT, ALKPHOS, BILITOT, PROT, ALBUMIN in the last 72 hours. No results for input(s): LIPASE, AMYLASE in the last 72 hours. CBC:  Recent Labs  11/27/15 1516 11/29/15 0327  WBC 4.2 5.8  HGB 10.9* 10.5*  HCT 32.8* 32.1*  MCV 95.3 95.3  PLT 123* 137*   Cardiac Enzymes:  Recent Labs  11/28/15 0305 11/28/15 0807 11/28/15 1455  TROPONINI <0.03 <0.03 <0.03   BNP: Invalid input(s): POCBNP D-Dimer: No results for input(s): DDIMER in the last 72 hours. Hemoglobin A1C: No results for input(s): HGBA1C in the last 72 hours. Fasting Lipid Panel: No results for input(s): CHOL, HDL, LDLCALC, TRIG, CHOLHDL, LDLDIRECT in the last 72  hours. Thyroid Function Tests: No results for input(s): TSH, T4TOTAL, T3FREE, THYROIDAB in the last 72 hours.  Invalid input(s): FREET3 Anemia Panel: No results for input(s): VITAMINB12, FOLATE, FERRITIN, TIBC, IRON, RETICCTPCT in the last 72 hours. Coag Panel:   Lab Results  Component Value Date   INR 1.40 11/29/2015   INR 1.26 11/27/2015   INR 1.22 05/09/2015    RADIOLOGY: Dg Chest 2 View  11/27/2015  CLINICAL DATA:  Per patient since christmas patient has had severe sharp pain in the middle and left side of his chest and has been severely SOB, patient states he took nitroglycerin last night and it helped his sharp chest pain a little. HX pa.*comment was truncated* EXAM: CHEST  2 VIEW COMPARISON:  10/02/2015 FINDINGS: Artifact degradation on the lateral view posteriorly. Prior median sternotomy. Pacer with leads at right atrium and right ventricle. No lead discontinuity. Remote posterior seventh left rib fracture. Midline trachea. Mild cardiomegaly with a tortuous thoracic aorta. Right paratracheal soft tissue density has been present back to 12/08/2011 and is likely due to prominent great vessels. Left costophrenic angle blunting is chronic and likely due to a pleural thickening. No congestive failure. Left base scarring. Increased density projecting over the  right upper lobe is felt to be due to osseous summation. IMPRESSION: No acute cardiopulmonary disease. Cardiomegaly with left base scarring and pleural thickening. Electronically Signed   By: Abigail Miyamoto M.D.   On: 11/27/2015 16:07    ASSESSMENT AND PLAN  Active Problems:  Essential hypertension  PACEMAKER, PERMANENT- St Jude Sept 2009  History of iron deficiency anemia  PAF (paroxysmal atrial fibrillation) ([facility])  Chronic diastolic CHF (congestive heart failure) ([place])  CAD in native artery  Chest pain  Reactive airway disease  Angina pectoris ([place])   1. CAD/Angina: patient with h/o remote CABG in 1989 and RCA PCI in  2004. His last LHC in March 2015 showed significant multivessel native coronary obstructive disease with 50% ostial LAD stenosis followed by 80% proximal stenosis in the region of the first diagonal takeoff followed by 80% stenosis after the second diagonal vessel; total occlusion of the ramus intermediate vessel with evidence for bidirectional retrograde collateralization; 40% ostial left circumflex stenosis; and widely patent proximal RCA stent with 80% stenosis in the anterior RV marginal branch, 40% mid or CVA stenosis, 50% distal, and 50% ostial and mid PDA stenoses. He had a patent LIMA graft supplying the mid LAD and an occluded Y graft which previously supplied the intermediate and distal circumflex.Medical therapy was elected. He has done well on medical therapy until recently. He has been on 25 mg of Coreg BID, 60 mg of Imdur daily and 1,000 mg of Ranexa BID. Also on ASA, Plavix and Lipitor. He was intolerant to amlodipine in the past due to soft blood pressure. He has had recurrent anginal symptoms for the last several weeks. Cardiac enzymes are negative x 2. EKG shows LBBB, also seen on prior EKGs. 2D Echo is pending. His BP has been moderately elevated since admission. Agree with increasing Imdur frequency to BID. 60 mg in the am and 30 mg at night. Continue Ranexa 1,000 mg BID. This is the max dose. His BB was changed from Coreg to cardioselective bisoprolol given acute COPD exacerbation. He is NPO for cath today.  Cardiac catheterization was discussed with the patient fully. The patient understands that risks include but are not limited to stroke (1 in 1000), death (1 in 9), kidney failure [usually temporary] (1 in 500), bleeding (1 in 200), allergic reaction [possibly serious] (1 in 200).  The patient understands and is willing to proceed.    2. Atrial Fibrillation: rate is controlled on BB. Despite CHA2DS2 VASc score of 5, patient not a candidate for a/c given prior h/o GIB.   3. SSS: s/p  PMM followed by Dr. Lovena Le.   4. Acute on Chronic Systolic CHF: BNP 123XX123. Trace LEE on exam. 2D echo pending. Continue lasix. Monitor I/Os, daily weights. Low sodium diet.   5. Acute COPD Exacerbation: management per IM.   6.  HTN - BP remains elevated.  Cannot titrate BB further due to bradycardia. Consider adding ARB or ACE I once cath done if renal function remains stable.       Sueanne Margarita, MD  11/29/2015  8:01 AM

## 2015-11-29 NOTE — H&P (View-Only) (Signed)
SUBJECTIVE:  No further CP  OBJECTIVE:   Vitals:   Filed Vitals:   11/28/15 1929 11/28/15 2212 11/29/15 0155 11/29/15 0348  BP:  150/63  153/66  Pulse: 64 60 59 67  Temp:  98.6 F (37 C)  98.2 F (36.8 C)  TempSrc:  Oral  Oral  Resp: 18 18 17 18   Height:      Weight:    168 lb 6.9 oz (76.4 kg)  SpO2: 98% 100% 98% 98%   I&O's:   Intake/Output Summary (Last 24 hours) at 11/29/15 0801 Last data filed at 11/29/15 0650  Gross per 24 hour  Intake    240 ml  Output    875 ml  Net   -635 ml   TELEMETRY: Reviewed telemetry pt in NSR:     PHYSICAL EXAM General: Well developed, well nourished, in no acute distress Head: Eyes PERRLA, No xanthomas.   Normal cephalic and atramatic  Lungs:   Clear bilaterally to auscultation and percussion. Heart:   HRRR S1 S2 Pulses are 2+ & equal. Abdomen: Bowel sounds are positive, abdomen soft and non-tender without masses  Extremities:   No clubbing, cyanosis or edema.  DP +1 Neuro: Alert and oriented X 3. Psych:  Good affect, responds appropriately   LABS: Basic Metabolic Panel:  Recent Labs  11/27/15 1516 11/29/15 0327  NA 138 138  K 4.1 3.8  CL 101 101  CO2 26 26  GLUCOSE 116* 141*  BUN 15 20  CREATININE 1.40* 1.25*  CALCIUM 8.9 8.7*   Liver Function Tests: No results for input(s): AST, ALT, ALKPHOS, BILITOT, PROT, ALBUMIN in the last 72 hours. No results for input(s): LIPASE, AMYLASE in the last 72 hours. CBC:  Recent Labs  11/27/15 1516 11/29/15 0327  WBC 4.2 5.8  HGB 10.9* 10.5*  HCT 32.8* 32.1*  MCV 95.3 95.3  PLT 123* 137*   Cardiac Enzymes:  Recent Labs  11/28/15 0305 11/28/15 0807 11/28/15 1455  TROPONINI <0.03 <0.03 <0.03   BNP: Invalid input(s): POCBNP D-Dimer: No results for input(s): DDIMER in the last 72 hours. Hemoglobin A1C: No results for input(s): HGBA1C in the last 72 hours. Fasting Lipid Panel: No results for input(s): CHOL, HDL, LDLCALC, TRIG, CHOLHDL, LDLDIRECT in the last 72  hours. Thyroid Function Tests: No results for input(s): TSH, T4TOTAL, T3FREE, THYROIDAB in the last 72 hours.  Invalid input(s): FREET3 Anemia Panel: No results for input(s): VITAMINB12, FOLATE, FERRITIN, TIBC, IRON, RETICCTPCT in the last 72 hours. Coag Panel:   Lab Results  Component Value Date   INR 1.40 11/29/2015   INR 1.26 11/27/2015   INR 1.22 05/09/2015    RADIOLOGY: Dg Chest 2 View  11/27/2015  CLINICAL DATA:  Per patient since christmas patient has had severe sharp pain in the middle and left side of his chest and has been severely SOB, patient states he took nitroglycerin last night and it helped his sharp chest pain a little. HX pa.*comment was truncated* EXAM: CHEST  2 VIEW COMPARISON:  10/02/2015 FINDINGS: Artifact degradation on the lateral view posteriorly. Prior median sternotomy. Pacer with leads at right atrium and right ventricle. No lead discontinuity. Remote posterior seventh left rib fracture. Midline trachea. Mild cardiomegaly with a tortuous thoracic aorta. Right paratracheal soft tissue density has been present back to 12/08/2011 and is likely due to prominent great vessels. Left costophrenic angle blunting is chronic and likely due to a pleural thickening. No congestive failure. Left base scarring. Increased density projecting over the  right upper lobe is felt to be due to osseous summation. IMPRESSION: No acute cardiopulmonary disease. Cardiomegaly with left base scarring and pleural thickening. Electronically Signed   By: Abigail Miyamoto M.D.   On: 11/27/2015 16:07    ASSESSMENT AND PLAN  Active Problems:  Essential hypertension  PACEMAKER, PERMANENT- [employer] Sept 2009  History of iron deficiency anemia  PAF (paroxysmal atrial fibrillation) (HCC)  Chronic diastolic CHF (congestive heart failure) ([place])  CAD in native artery  Chest pain  Reactive airway disease  Angina pectoris ([place])   1. CAD/Angina: patient with h/o remote CABG in 1989 and RCA PCI in  2004. His last LHC in March 2015 showed significant multivessel native coronary obstructive disease with 50% ostial LAD stenosis followed by 80% proximal stenosis in the region of the first diagonal takeoff followed by 80% stenosis after the second diagonal vessel; total occlusion of the ramus intermediate vessel with evidence for bidirectional retrograde collateralization; 40% ostial left circumflex stenosis; and widely patent proximal RCA stent with 80% stenosis in the anterior RV marginal branch, 40% mid or CVA stenosis, 50% distal, and 50% ostial and mid PDA stenoses. He had a patent LIMA graft supplying the mid LAD and an occluded Y graft which previously supplied the intermediate and distal circumflex.Medical therapy was elected. He has done well on medical therapy until recently. He has been on 25 mg of Coreg BID, 60 mg of Imdur daily and 1,000 mg of Ranexa BID. Also on ASA, Plavix and Lipitor. He was intolerant to amlodipine in the past due to soft blood pressure. He has had recurrent anginal symptoms for the last several weeks. Cardiac enzymes are negative x 2. EKG shows LBBB, also seen on prior EKGs. 2D Echo is pending. His BP has been moderately elevated since admission. Agree with increasing Imdur frequency to BID. 60 mg in the am and 30 mg at night. Continue Ranexa 1,000 mg BID. This is the max dose. His BB was changed from Coreg to cardioselective bisoprolol given acute COPD exacerbation. He is NPO for cath today.  Cardiac catheterization was discussed with the patient fully. The patient understands that risks include but are not limited to stroke (1 in 1000), death (1 in 26), kidney failure [usually temporary] (1 in 500), bleeding (1 in 200), allergic reaction [possibly serious] (1 in 200).  The patient understands and is willing to proceed.    2. Atrial Fibrillation: rate is controlled on BB. Despite CHA2DS2 VASc score of 5, patient not a candidate for a/c given prior h/o GIB.   3. SSS: s/p  PMM followed by Dr. Lovena Le.   4. Acute on Chronic Systolic CHF: BNP 123XX123. Trace LEE on exam. 2D echo pending. Continue lasix. Monitor I/Os, daily weights. Low sodium diet.   5. Acute COPD Exacerbation: management per IM.   6.  HTN - BP remains elevated.  Cannot titrate BB further due to bradycardia. Consider adding ARB or ACE I once cath done if renal function remains stable.       John Margarita, MD  11/29/2015  8:01 AM

## 2015-11-29 NOTE — Clinical Documentation Improvement (Signed)
Cardiology Internal Medicine  Abnormal Lab/Test Results:   Component      Platelets  Latest Ref Rng      150 - 400 K/uL  11/27/2015     3:16 PM 123 (L)  11/29/2015      137 (L)    Possible Clinical Conditions associated with below indicators  Thrombocytopenia  Other Condition  Cannot Clinically Determine  Please exercise your independent, professional judgment when responding. A specific answer is not anticipated or expected.  Thank you, Mateo Flow, RN [phone] Clinical Documentation Specialist

## 2015-11-29 NOTE — Care Management Note (Signed)
Case Management Note  Patient Details  Name: John Peck MRN: [id number] Date of Birth: 1924-07-29  Subjective/Objective:   Pt admitted with chronic diastolic CHF                 Action/Plan:  Pt is from home with wife, pt uses cane for assistance with ambulation at home.  Pt stated that he sometimes forgets how to arrange his medications for daily use, CM requested [facility] for medication management from attending.   CM assessed pt for HF screening; pt agreed to weigh self daily and adhere to low sodium diet. CM will continue to monitor for diposition needs   Expected Discharge Date:  11/29/15               Expected Discharge Plan:  [place]  In-House Referral:     Discharge planning Services  CM Consult  Post Acute Care Choice:    Choice offered to:  Patient  DME Arranged:    DME Agency:     HH Arranged:  [place] Agency:  [facility]  Status of Service:  In process, will continue to follow  Medicare Important Message Given:    Date Medicare IM Given:    Medicare IM give by:    Date Additional Medicare IM Given:    Additional Medicare Important Message give by:     If discussed at [place] of Stay Meetings, dates discussed:    Additional Comments: CM offered pt choice for HH, pt and wife chose [facility], agency contacted and referral accepted John Labrador, RN 11/29/2015, 10:12 AM

## 2015-11-29 NOTE — Interval H&P Note (Signed)
History and Physical Interval Note:  11/29/2015 12:31 PM  John Peck  has presented today for cardiac cath with the diagnosis of CAD s/p CABG, unstable angina. The various methods of treatment have been discussed with the patient and family. After consideration of risks, benefits and other options for treatment, the patient has consented to  Procedure(s): Left Heart Cath and Cors/Grafts Angiography (N/A) as a surgical intervention .  The patient's history has been reviewed, patient examined, no change in status, stable for surgery.  I have reviewed the patient's chart and labs.  Questions were answered to the patient's satisfaction.    Cath Lab Visit (complete for each Cath Lab visit)  Clinical Evaluation Leading to the Procedure:   ACS: No.  Non-ACS:    Anginal Classification: CCS III  Anti-ischemic medical therapy: Maximal Therapy (2 or more classes of medications)  Non-Invasive Test Results: No non-invasive testing performed  Prior CABG: Previous CABG        Vyron Fronczak

## 2015-11-29 NOTE — Progress Notes (Signed)
PROGRESS NOTE  John Peck [id number] DOB: 1924-03-21 DOA: 11/27/2015 PCP: Patricia Nettle, MD  Brief History 82 year old male with a history of hypertension, coronary artery disease, diastolic CHF, paroxysmal atrial fibrillation, sick sinus syndrome with PPM presented with 3-4 week history of worsening dyspnea on exertion and exertional chest discomfort. The patient relates shortness of breath even walking from his bedroom to his kitchen. He also has chest discomfort at rest, but appears to be precipitated by exertion as well. He denies any fevers, chills, nausea, vomiting, diarrhea, syncope. He has an occasional nonproductive cough. The patient last had heart catheterization 01/25/2014 which showed EF 45% and significant native multivessel coronary disease. It was recommended to optimize his medical therapy at that time.  Assessment/Plan: Stable Angina/Coronary artery disease patient with h/o remote CABG in 1989 and RCA PCI in 2004. His last [facility] 01/25/14  showed significant multivessel native coronary obstructive disease  He has done well on medical therapy until recently. He has been on 25 mg of Coreg BID, 60 mg of Imdur daily and 1,000 mg of Ranexa BID. Also on ASA, Plavix and Lipitor. Cardiac enzymes are negative x 2. EKG shows LBBB, also seen on prior EKGs. 2D Echo is pending. His BP has been moderately elevated since admission. Increased Imdur frequency to BID. 60 mg in the am and 30 mg at night. Continue Ranexa 1,000 mg BID. Patient NPO for cardiac catheterization today   Acute on Chronic Systolic CHF: BNP 123XX123. Trace LEE on exam. 2D echo pending. Continue lasix. Monitor I/Os, daily weights -Review of the medical record shows that the patient's weight has been relatively stable over the past 6 months    COPD exacerbation --Approximately 30-pack-year history -Continue intravenous steroids, Start tapering -Continue doxycycline -Aerosolized albuterol and  atrovent  Atrial fibrillation -Rate controlled -Continue bisoprolol -patient not a candidate for a/c given prior h/o GIB.  -CHADSVASc = 5  SSS -S/p PPM 2009 -stable  CKD stage 2-3  -Baseline creatinine 1.1-1.4   Family Communication:   Pt at beside Disposition Plan:   Home when medically stable per cards       Procedures/Studies: Dg Chest 2 View  11/27/2015  CLINICAL DATA:  Per patient since christmas patient has had severe sharp pain in the middle and left side of his chest and has been severely SOB, patient states he took nitroglycerin last night and it helped his sharp chest pain a little. HX pa.*comment was truncated* EXAM: CHEST  2 VIEW COMPARISON:  10/02/2015 FINDINGS: Artifact degradation on the lateral view posteriorly. Prior median sternotomy. Pacer with leads at right atrium and right ventricle. No lead discontinuity. Remote posterior seventh left rib fracture. Midline trachea. Mild cardiomegaly with a tortuous thoracic aorta. Right paratracheal soft tissue density has been present back to 12/08/2011 and is likely due to prominent great vessels. Left costophrenic angle blunting is chronic and likely due to a pleural thickening. No congestive failure. Left base scarring. Increased density projecting over the right upper lobe is felt to be due to osseous summation. IMPRESSION: No acute cardiopulmonary disease. Cardiomegaly with left base scarring and pleural thickening. Electronically Signed   By: Abigail Miyamoto M.D.   On: 11/27/2015 16:07        Subjective: Patient denies fevers, chills,NPO,  No further CP   Objective: Filed Vitals:   11/28/15 2212 11/29/15 0155 11/29/15 0348 11/29/15 0816  BP: 150/63  153/66   Pulse: 60 59 67   Temp:  98.6 F (37 C)  98.2 F (36.8 C)   TempSrc: Oral  Oral   Resp: 18 17 18    Height:      Weight:   76.4 kg (168 lb 6.9 oz)   SpO2: 100% 98% 98% 93%    Intake/Output Summary (Last 24 hours) at 11/29/15 0839 Last data filed at  11/29/15 0650  Gross per 24 hour  Intake    240 ml  Output    875 ml  Net   -635 ml   Weight change: -2.9 kg (-6 lb 6.3 oz) Exam:  General: Well developed, well nourished, in no acute distress Head: Eyes PERRLA, No xanthomas. Normal cephalic and atramatic Lungs: Clear bilaterally to auscultation and percussion. Heart: HRRR S1 S2 Pulses are 2+ & equal. Abdomen: Bowel sounds are positive, abdomen soft and non-tender without masses  Extremities: No clubbing, cyanosis or edema. DP +1 Neuro: Alert and oriented X 3. Psych: Good affect, responds appropriately   Data Reviewed: Basic Metabolic Panel:  Recent Labs Lab 11/27/15 1516 11/29/15 0327  NA 138 138  K 4.1 3.8  CL 101 101  CO2 26 26  GLUCOSE 116* 141*  BUN 15 20  CREATININE 1.40* 1.25*  CALCIUM 8.9 8.7*   Liver Function Tests: No results for input(s): AST, ALT, ALKPHOS, BILITOT, PROT, ALBUMIN in the last 168 hours. No results for input(s): LIPASE, AMYLASE in the last 168 hours. No results for input(s): AMMONIA in the last 168 hours. CBC:  Recent Labs Lab 11/27/15 1516 11/29/15 0327  WBC 4.2 5.8  HGB 10.9* 10.5*  HCT 32.8* 32.1*  MCV 95.3 95.3  PLT 123* 137*   Cardiac Enzymes:  Recent Labs Lab 11/28/15 0305 11/28/15 0807 11/28/15 1455  TROPONINI <0.03 <0.03 <0.03   BNP: Invalid input(s): POCBNP CBG: No results for input(s): GLUCAP in the last 168 hours.  No results found for this or any previous visit (from the past 240 hour(s)).   Scheduled Meds: . aspirin  81 mg Oral Pre-Cath  . aspirin EC  81 mg Oral Daily  . atorvastatin  40 mg Oral Daily  . bisoprolol  5 mg Oral Daily  . clopidogrel  75 mg Oral Daily  . doxycycline  100 mg Oral Q12H  . enoxaparin (LOVENOX) injection  40 mg Subcutaneous Q24H  . ipratropium  0.5 mg Nebulization Q6H  . isosorbide mononitrate  30 mg Oral QHS  . isosorbide mononitrate  60 mg Oral Daily  . methylPREDNISolone (SOLU-MEDROL) injection  60 mg  Intravenous BID  . pantoprazole  40 mg Oral BID  . ranolazine  1,000 mg Oral BID  . sodium chloride  3 mL Intravenous Q12H  . tamsulosin  0.4 mg Oral Daily   Continuous Infusions: . sodium chloride 1 mL/kg/hr (11/29/15 0838)     Reyne Dumas, md  [facility] Pager [phone]  If 7PM-7AM, please contact night-coverage [email or web address] Password [email or web address] 11/29/2015, 8:39 AM   LOS: 1 day

## 2015-11-30 ENCOUNTER — Encounter (HOSPITAL_COMMUNITY): Payer: Self-pay | Admitting: Nurse Practitioner

## 2015-11-30 DIAGNOSIS — N182 Chronic kidney disease, stage 2 (mild): Secondary | ICD-10-CM

## 2015-11-30 DIAGNOSIS — I2510 Atherosclerotic heart disease of native coronary artery without angina pectoris: Secondary | ICD-10-CM | POA: Diagnosis present

## 2015-11-30 DIAGNOSIS — I119 Hypertensive heart disease without heart failure: Secondary | ICD-10-CM | POA: Diagnosis present

## 2015-11-30 DIAGNOSIS — N183 Chronic kidney disease, stage 3 (moderate): Secondary | ICD-10-CM

## 2015-11-30 DIAGNOSIS — Z955 Presence of coronary angioplasty implant and graft: Secondary | ICD-10-CM | POA: Insufficient documentation

## 2015-11-30 DIAGNOSIS — E785 Hyperlipidemia, unspecified: Secondary | ICD-10-CM | POA: Diagnosis present

## 2015-11-30 DIAGNOSIS — N179 Acute kidney failure, unspecified: Secondary | ICD-10-CM | POA: Diagnosis present

## 2015-11-30 LAB — COMPREHENSIVE METABOLIC PANEL
ALBUMIN: 3 g/dL — AB (ref 3.5–5.0)
ALK PHOS: 65 U/L (ref 38–126)
ALT: 20 U/L (ref 17–63)
AST: 15 U/L (ref 15–41)
Anion gap: 9 (ref 5–15)
BILIRUBIN TOTAL: 0.8 mg/dL (ref 0.3–1.2)
BUN: 24 mg/dL — AB (ref 6–20)
CALCIUM: 8.3 mg/dL — AB (ref 8.9–10.3)
CO2: 25 mmol/L (ref 22–32)
Chloride: 104 mmol/L (ref 101–111)
Creatinine, Ser: 1.41 mg/dL — ABNORMAL HIGH (ref 0.61–1.24)
GFR calc Af Amer: 49 mL/min — ABNORMAL LOW (ref >60)
GFR, EST NON AFRICAN AMERICAN: 43 mL/min — AB (ref >60)
GLUCOSE: 133 mg/dL — AB (ref 65–99)
Potassium: 3.6 mmol/L (ref 3.5–5.1)
Sodium: 138 mmol/L (ref 135–145)
TOTAL PROTEIN: 5.9 g/dL — AB (ref 6.5–8.1)

## 2015-11-30 LAB — CBC
HEMATOCRIT: 31 % — AB (ref 39.0–52.0)
HEMOGLOBIN: 10.3 g/dL — AB (ref 13.0–17.0)
MCH: 31 pg (ref 26.0–34.0)
MCHC: 33.2 g/dL (ref 30.0–36.0)
MCV: 93.4 fL (ref 78.0–100.0)
Platelets: 139 10*3/uL — ABNORMAL LOW (ref 150–400)
RBC: 3.32 MIL/uL — ABNORMAL LOW (ref 4.22–5.81)
RDW: 14.6 % (ref 11.5–15.5)
WBC: 7.5 10*3/uL (ref 4.0–10.5)

## 2015-11-30 MED ORDER — AZITHROMYCIN 500 MG PO TABS: 500.0000 mg | ORAL_TABLET | Freq: Every day | ORAL | Status: DC

## 2015-11-30 NOTE — Discharge Summary (Addendum)
Physician Discharge Summary  John Peck MRN: [id number] DOB/AGE: [birth year] 80 y.o.  PCP: Patricia Nettle, MD   Admit date: 11/27/2015 Discharge date: 11/30/2015  Discharge Diagnoses:     Principal Problem:   Coronary artery disease involving native coronary artery of native heart with unstable angina pectoris [facility]) Active Problems:   PACEMAKER, PERMANENT- St Jude Sept 2009   History of iron deficiency anemia   PAF (paroxysmal atrial fibrillation) (HCC)   Chronic diastolic CHF (congestive heart failure) (HCC)   Reactive airway disease   Angina pectoris (HCC)   CAD (coronary artery disease)   Hypertensive heart disease   Sick sinus syndrome (HCC)   CKD (chronic kidney disease), stage II   Hyperlipidemia Successful PTCA/DES x 1 distal RCA on 1/20   Follow-up recommendations Follow-up with PCP in 3-5 days , including all  additional recommended appointments as below Follow-up CBC, CMP in 3-5 days ASA and Plavix for at least one year Consider acei/arb as outpt if renal fxn stable    Medication List    STOP taking these medications        predniSONE 20 MG tablet  Commonly known as:  DELTASONE      TAKE these medications        aspirin 81 MG tablet  Take 81 mg by mouth daily.  Notes to Patient:  Prevents clotting in stent and heart attack      atorvastatin 40 MG tablet  Commonly known as:  LIPITOR  Take 1 tablet (40 mg total) by mouth daily.  Notes to Patient:  Cholesterol      azithromycin 500 MG tablet  Commonly known as:  ZITHROMAX  Take 1 tablet (500 mg total) by mouth daily.     carvedilol 25 MG tablet  Commonly known as:  COREG  Take 25 mg by mouth 2 (two) times daily with a meal.  Notes to Patient:  Decreases work of the heart Decreases blood pressure and heart rate     clopidogrel 75 MG tablet  Commonly known as:  PLAVIX  Take 75 mg by mouth daily at 3 pm.  Notes to Patient:  Prevents clotting in stent and heart attack      ferrous sulfate  325 (65 FE) MG tablet  Take 325 mg by mouth 3 (three) times daily with meals.  Notes to Patient:  Iron supplement      furosemide 20 MG tablet  Commonly known as:  LASIX  Take 40 mg by mouth daily.  Notes to Patient:  Fluid pill     ICY HOT EX  Apply 1 application topically daily as needed (aches and pains).  Notes to Patient:  Pain      isosorbide mononitrate 60 MG 24 hr tablet  Commonly known as:  IMDUR  TAKE 2 TABLETS BY MOUTH EVERY MORNING AND 1 TABLET IN THE EVENING     lubiprostone 24 MCG capsule  Commonly known as:  AMITIZA  Take 24 mcg by mouth daily as needed. For bowel movement  Notes to Patient:  Laxative       NITROSTAT 0.4 MG SL tablet  Generic drug:  nitroGLYCERIN  PLACE 1 TABLET UNDER THE TONGUE AS NEEDED.  Notes to Patient:  Emergency chest pain     pantoprazole 40 MG tablet  Commonly known as:  PROTONIX  Take 40 mg by mouth 2 (two) times daily.  Notes to Patient:  Heartburn acid reflux     ranolazine 1000 MG SR tablet  Commonly known  as:  RANEXA  Take 1 tablet (1,000 mg total) by mouth 2 (two) times daily.  Notes to Patient:  Chest pain     tamsulosin 0.4 MG Caps capsule  Commonly known as:  FLOMAX  Take 0.4 mg by mouth daily.  Notes to Patient:  Prostate          Discharge Condition: *   Discharge Instructions     No Known Allergies    Disposition: 01-Home or Self Care   Consults:  Cardiology    Significant Diagnostic Studies:  Dg Chest 2 View  11/27/2015  CLINICAL DATA:  Per patient since christmas patient has had severe sharp pain in the middle and left side of his chest and has been severely SOB, patient states he took nitroglycerin last night and it helped his sharp chest pain a little. HX pa.*comment was truncated* EXAM: CHEST  2 VIEW COMPARISON:  10/02/2015 FINDINGS: Artifact degradation on the lateral view posteriorly. Prior median sternotomy. Pacer with leads at right atrium and right ventricle. No lead discontinuity.  Remote posterior seventh left rib fracture. Midline trachea. Mild cardiomegaly with a tortuous thoracic aorta. Right paratracheal soft tissue density has been present back to 12/08/2011 and is likely due to prominent great vessels. Left costophrenic angle blunting is chronic and likely due to a pleural thickening. No congestive failure. Left base scarring. Increased density projecting over the right upper lobe is felt to be due to osseous summation. IMPRESSION: No acute cardiopulmonary disease. Cardiomegaly with left base scarring and pleural thickening. Electronically Signed   By: Abigail Miyamoto M.D.   On: 11/27/2015 16:07     Left heart cath 11/29/15  Procedures    Coronary Stent Intervention   Left Heart Cath and Cors/Grafts Angiography    Conclusion    1. Severe double vessel CAD 2. LAD occluded mid. Presumed to fill distally from IMA graft but could not engage this graft. IMA graft patent by cath 2015.  3. Circumflex with mild proximal disease 4. RCA with patent proximal stent with minimal restenosis and severe distal stenosis.  5. Successful PTCA/DES x 1 distal RCA  Recommendations: ASA and Plavix for at least one year. OK to discharge home in am if he is stable.      Filed Weights   11/28/15 0509 11/29/15 0348 11/30/15 0447  Weight: 77.4 kg (170 lb 10.2 oz) 76.4 kg (168 lb 6.9 oz) 80.6 kg (177 lb 11.1 oz)     Microbiology: No results found for this or any previous visit (from the past 240 hour(s)).     Blood Culture    Component Value Date/Time   SDES  05/29/2011 0130    BLOOD BOTTLES DRAWN AEROBIC AND ANAEROBIC 5CC L HAND   SPECREQUEST IMMUNE:NORM VAR 05/29/2011 0130   CULT NO GROWTH 5 DAYS 05/29/2011 0130   REPTSTATUS 06/04/2011 FINAL 05/29/2011 0130      Labs: Results for orders placed or performed during the hospital encounter of 11/27/15 (from the past 48 hour(s))  Troponin I     Status: None   Collection Time: 11/28/15  2:55 PM  Result Value Ref Range    Troponin I <0.03 <0.031 ng/mL    Comment:        NO INDICATION OF MYOCARDIAL INJURY.   Basic metabolic panel     Status: Abnormal   Collection Time: 11/29/15  3:27 AM  Result Value Ref Range   Sodium 138 135 - 145 mmol/L   Potassium 3.8 3.5 - 5.1 mmol/L  Chloride 101 101 - 111 mmol/L   CO2 26 22 - 32 mmol/L   Glucose, Bld 141 (H) 65 - 99 mg/dL   BUN 20 6 - 20 mg/dL   Creatinine, Ser 1.25 (H) 0.61 - 1.24 mg/dL   Calcium 8.7 (L) 8.9 - 10.3 mg/dL   GFR calc non Af Amer 49 (L) >60 mL/min   GFR calc Af Amer 57 (L) >60 mL/min    Comment: (NOTE) The eGFR has been calculated using the CKD EPI equation. This calculation has not been validated in all clinical situations. eGFR's persistently <60 mL/min signify possible Chronic Kidney Disease.    Anion gap 11 5 - 15  Protime-INR     Status: Abnormal   Collection Time: 11/29/15  3:27 AM  Result Value Ref Range   Prothrombin Time 17.3 (H) 11.6 - 15.2 seconds   INR 1.40 0.00 - 1.49  CBC     Status: Abnormal   Collection Time: 11/29/15  3:27 AM  Result Value Ref Range   WBC 5.8 4.0 - 10.5 K/uL   RBC 3.37 (L) 4.22 - 5.81 MIL/uL   Hemoglobin 10.5 (L) 13.0 - 17.0 g/dL   HCT 32.1 (L) 39.0 - 52.0 %   MCV 95.3 78.0 - 100.0 fL   MCH 31.2 26.0 - 34.0 pg   MCHC 32.7 30.0 - 36.0 g/dL   RDW 14.3 11.5 - 15.5 %   Platelets 137 (L) 150 - 400 K/uL  Glucose, capillary     Status: Abnormal   Collection Time: 11/29/15 11:32 AM  Result Value Ref Range   Glucose-Capillary 129 (H) 65 - 99 mg/dL   Comment 1 Notify RN   POCT Activated clotting time     Status: None   Collection Time: 11/29/15  1:32 PM  Result Value Ref Range   Activated Clotting Time 590 seconds  CBC     Status: Abnormal   Collection Time: 11/30/15  5:40 AM  Result Value Ref Range   WBC 7.5 4.0 - 10.5 K/uL   RBC 3.32 (L) 4.22 - 5.81 MIL/uL   Hemoglobin 10.3 (L) 13.0 - 17.0 g/dL   HCT 31.0 (L) 39.0 - 52.0 %   MCV 93.4 78.0 - 100.0 fL   MCH 31.0 26.0 - 34.0 pg   MCHC 33.2 30.0  - 36.0 g/dL   RDW 14.6 11.5 - 15.5 %   Platelets 139 (L) 150 - 400 K/uL  Comprehensive metabolic panel     Status: Abnormal   Collection Time: 11/30/15  5:40 AM  Result Value Ref Range   Sodium 138 135 - 145 mmol/L   Potassium 3.6 3.5 - 5.1 mmol/L   Chloride 104 101 - 111 mmol/L   CO2 25 22 - 32 mmol/L   Glucose, Bld 133 (H) 65 - 99 mg/dL   BUN 24 (H) 6 - 20 mg/dL   Creatinine, Ser 1.41 (H) 0.61 - 1.24 mg/dL   Calcium 8.3 (L) 8.9 - 10.3 mg/dL   Total Protein 5.9 (L) 6.5 - 8.1 g/dL   Albumin 3.0 (L) 3.5 - 5.0 g/dL   AST 15 15 - 41 U/L   ALT 20 17 - 63 U/L   Alkaline Phosphatase 65 38 - 126 U/L   Total Bilirubin 0.8 0.3 - 1.2 mg/dL   GFR calc non Af Amer 43 (L) >60 mL/min   GFR calc Af Amer 49 (L) >60 mL/min    Comment: (NOTE) The eGFR has been calculated using the CKD EPI equation. This calculation  has not been validated in all clinical situations. eGFR's persistently <60 mL/min signify possible Chronic Kidney Disease.    Anion gap 9 5 - 15     Lipid Panel     Component Value Date/Time   CHOL 149 11/01/2015 0828   TRIG 90 11/01/2015 0828   HDL 44 11/01/2015 0828   CHOLHDL 3.4 11/01/2015 0828   VLDL 18 11/01/2015 0828   LDLCALC 87 11/01/2015 0828   LDLDIRECT 55 12/10/2011 0806     No results found for: HGBA1C   Lab Results  Component Value Date   LDLCALC 87 11/01/2015   CREATININE 1.41* 11/30/2015     82 year old male with a history of hypertension, coronary artery disease, diastolic CHF, paroxysmal atrial fibrillation, sick sinus syndrome with PPM presented with 3-4 week history of worsening dyspnea on exertion and exertional chest discomfort. The patient relates shortness of breath even walking from his bedroom to his kitchen. He also has chest discomfort at rest, but appears to be precipitated by exertion as well. He denies any fevers, chills, nausea, vomiting, diarrhea, syncope. He has an occasional nonproductive cough. The patient last had heart  catheterization 01/25/2014 which showed EF 45% and significant native multivessel coronary disease. It was recommended to optimize his medical therapy at that time. Patient had a repeat cardiac cath on 11/29/15 during this admission   Assessment/Plan: Stable Angina/Coronary artery disease patient with h/o remote CABG in 1989 and RCA PCI in 2004. His last [facility] 01/25/14 showed significant multivessel native coronary obstructive disease  He has done well on medical therapy until recently. He has been on 25 mg of Coreg BID, 60 mg of Imdur daily and 1,000 mg of Ranexa BID. Also on ASA, Plavix and Lipitor. Cardiac enzymes are negative x 2. EKG shows LBBB, also seen on prior EKGs. 2D Echo results still pending at the time of discharge. His BP has been moderately elevated since admission. Increased Imdur frequency to BID. 60 mg in the am and 30 mg at night. Continue Ranexa 1,000 mg BID. Status postcardiac cath, results as above, patient to continue with aspirin and Plavix for 1 year Cardiology to modify discharge medications as indicated, discussed with Murray Hodgkins NP Follow-up with cardiology in 2 weeks   Acute on Chronic Systolic CHF: BNP 545. Trace LEE on exam. 2D echo pending. Continue lasix. -Review of the medical record shows that the patient's weight has been relatively stable over the past 6 months   COPD exacerbation --Approximately 30-pack-year history -Patient was given IV steroids, started on doxycycline Patient will be discharged home on azithromycin 4 days -Continue  Aerosolized albuterol and atrovent  Atrial fibrillation -Rate controlled -Continue bisoprolol -patient not a candidate for anticoagulation given prior h/o GIB.  -CHADSVASc = 5 Currently on aspirin and Plavix  6 sinus syndrome -S/p PPM 2009 -stable  CKD stage 2-3  -Baseline creatinine 1.1-1.4     Discharge Exam:    Blood pressure 155/56, pulse 71, temperature 98.2 F (36.8 C), temperature source  Oral, resp. rate 18, height _0  (1.727 m), weight 80.6 kg (177 lb 11.1 oz), SpO2 100 %.  General: Pleasant, NAD. Neuro: Alert and oriented X 3. Moves all extremities spontaneously. Psych: Normal affect. HEENT: Normal Neck: Supple without bruits or JVD. Lungs: Resp regular and unlabored, CTA. Heart: RRR no s3, s4, 2/6 SEM RUSB. Abdomen: Soft, non-tender, non-distended, BS + x 4.  Extremities: No clubbing, cyanosis or edema. DP/PT/Radials 1+ and equal bilaterally. R groin cath site w/o bleeding/bruit/ hematoma.  Follow-up Information    Follow up with [facility].   Why:  Registerd Nurse for Medication Management   Contact information:   [address] [place] [address] [phone]       Follow up with Shelva Majestic A, MD In 2 weeks.   Specialty:  Cardiology   Why:  we will arrange for f/u and contact you.   Contact information:   [address] [place] [address] [phone]       Follow up with Patricia Nettle, MD. Schedule an appointment as soon as possible for a visit in 1 week.   Specialty:  Cardiology   Contact information:   [place] [address] [phone]       Signed: Reyne Dumas 11/30/2015, 9:47 AM        Time spent >45 mins

## 2015-11-30 NOTE — Progress Notes (Signed)
CARDIAC REHAB PHASE I   PRE:  Rate/Rhythm: paced 66  BP:  Supine:   Sitting: 155/65  Standing:    SaO2:   MODE:  Ambulation: 400 ft   POST:  Rate/Rhythm: paced 84  BP:  Supine:   Sitting: 180/78,186/72  Standing:    SaO2:  1010-1055 Pt walked 400 ft on RA with rolling walker with fairly steady gait. Tolerated well. No CP. BP elevated after walk and NP aware. Waited for wife for ed. Stressed importance of plavix with stent. Encouraged pt to watch salt in diet. Did not give ex ed as pt mainly walks in house. Did encourage him to walk as tolerated. Discussed CRP 2 and will refer to [facility]. Pt may not be able to attend as transportation will be hard to arrange.   Graylon Good, RN BSN  11/30/2015 10:50 AM

## 2015-11-30 NOTE — Progress Notes (Addendum)
Patient Name: John Peck Date of Encounter: 11/30/2015    Principal Problem:   Coronary artery disease involving native coronary artery of native heart with unstable angina pectoris ([place]) Active Problems:   PAF (paroxysmal atrial fibrillation) (HCC)   Angina pectoris (HCC)   CAD (coronary artery disease)   Chronic diastolic CHF (congestive heart failure) ([place])   Hypertensive heart disease   CKD (chronic kidney disease), stage II   PACEMAKER, PERMANENT- St Jude Sept 2009   History of iron deficiency anemia   Reactive airway disease   Sick sinus syndrome (HCC)   Hyperlipidemia    SUBJECTIVE  Brief episode of c/p associated with dyspnea - lasted a few mins and resolved spontaneously.  Hasn't been up yet.  CURRENT MEDS . aspirin EC  81 mg Oral Daily  . atorvastatin  40 mg Oral Daily  . bisoprolol  5 mg Oral Daily  . clopidogrel  75 mg Oral Daily  . doxycycline  100 mg Oral Q12H  . isosorbide mononitrate  30 mg Oral QHS  . isosorbide mononitrate  60 mg Oral Daily  . methylPREDNISolone (SOLU-MEDROL) injection  60 mg Intravenous BID  . pantoprazole  40 mg Oral BID  . ranolazine  1,000 mg Oral BID  . sodium chloride  3 mL Intravenous Q12H  . tamsulosin  0.4 mg Oral Daily    OBJECTIVE  Filed Vitals:   11/30/15 0447 11/30/15 0500 11/30/15 0600 11/30/15 0746  BP: 157/79 160/67 149/79 155/56  Pulse: 72 67 61 71  Temp: 98 F (36.7 C)   98.2 F (36.8 C)  TempSrc: Oral   Oral  Resp: 19 18 19 18   Height:      Weight: 177 lb 11.1 oz (80.6 kg)     SpO2: 99% 100% 100% 100%    Intake/Output Summary (Last 24 hours) at 11/30/15 0837 Last data filed at 11/30/15 0747  Gross per 24 hour  Intake  797.5 ml  Output    775 ml  Net   22.5 ml   Filed Weights   11/28/15 0509 11/29/15 0348 11/30/15 0447  Weight: 170 lb 10.2 oz (77.4 kg) 168 lb 6.9 oz (76.4 kg) 177 lb 11.1 oz (80.6 kg)    PHYSICAL EXAM  General: Pleasant, NAD. Neuro: Alert and oriented X 3. Moves all  extremities spontaneously. Psych: Normal affect. HEENT:  Normal  Neck: Supple without bruits or JVD. Lungs:  Resp regular and unlabored, CTA. Heart: RRR no s3, s4, 2/6 SEM RUSB. Abdomen: Soft, non-tender, non-distended, BS + x 4.  Extremities: No clubbing, cyanosis or edema. DP/PT/Radials 1+ and equal bilaterally. R groin cath site w/o bleeding/bruit/ hematoma.  Accessory Clinical Findings  CBC  Recent Labs  11/29/15 0327 11/30/15 0540  WBC 5.8 7.5  HGB 10.5* 10.3*  HCT 32.1* 31.0*  MCV 95.3 93.4  PLT 137* [id number]*   Basic Metabolic Panel  Recent Labs  11/29/15 0327 11/30/15 0540  NA 138 138  K 3.8 3.6  CL 101 104  CO2 26 25  GLUCOSE 141* 133*  BUN 20 24*  CREATININE 1.25* 1.41*  CALCIUM 8.7* 8.3*   Liver Function Tests  Recent Labs  11/30/15 0540  AST 15  ALT 20  ALKPHOS 65  BILITOT 0.8  PROT 5.9*  ALBUMIN 3.0*   Cardiac Enzymes  Recent Labs  11/28/15 0305 11/28/15 0807 11/28/15 1455  TROPONINI <0.03 <0.03 <0.03   TELE  AV paced, A paced.  ECG  V paced w/ occasional AV pacing, 74  no acute st/t changes.  Radiology/Studies  Dg Chest 2 View  11/27/2015  CLINICAL DATA:  Per patient since christmas patient has had severe sharp pain in the middle and left side of his chest and has been severely SOB, patient states he took nitroglycerin last night and it helped his sharp chest pain a little. HX pa.*comment was truncated* EXAM: CHEST  2 VIEW COMPARISON:  10/02/2015 FINDINGS: Artifact degradation on the lateral view posteriorly. Prior median sternotomy. Pacer with leads at right atrium and right ventricle. No lead discontinuity. Remote posterior seventh left rib fracture. Midline trachea. Mild cardiomegaly with a tortuous thoracic aorta. Right paratracheal soft tissue density has been present back to 12/08/2011 and is likely due to prominent great vessels. Left costophrenic angle blunting is chronic and likely due to a pleural thickening. No congestive  failure. Left base scarring. Increased density projecting over the right upper lobe is felt to be due to osseous summation. IMPRESSION: No acute cardiopulmonary disease. Cardiomegaly with left base scarring and pleural thickening. Electronically Signed   By: Abigail Miyamoto M.D.   On: 11/27/2015 16:07    ASSESSMENT AND PLAN  1.  [place]/CAD:  S/p cath yesterday revealing severe dist native RCA dzs with known occlusion of the VG OM and presumed patency of the LIMA LAD.  S/P pci/des to the RCA.  He had a brief episode of c/p this AM assoc with dyspnea - resolved spont w/in a few mins.  Hasn't ambulated yet.  Rehab to see.  Cont asa, statin, bb (not wheezing, switch back to coreg), plavix, nitrate, ranexa.  If he does well w/ rehab, plan d/c today.  We will arrange for f/u in 2 wks.  2.  Hypertensive Heart Disease:  BP up slightly this AM.  F/U after AM meds.  3.  HL:  LDL 87.  Cont lipitor.  4.  CKD III: creat up slightly but within his prev range.    5.  PAF:  CHA2DS2VASc = 5.  Not on [place] 2/2 h/o GIB.  Currently on asa/plavix in setting of above.  6.  SSS:  S/p PPM.  Followed by [facility].  7.  Acute on chronic combined systolic/diastolic CHF:  Echo done 0000000 - don't see report yet.  Volume looks good this AM.  EDP 12 yesterday.  Minus 2.1L this admission.  Suspect wts are inaccurate (listed as up 9 lbs since yesterday despite min + i/o in past 24 hrs (up 2.5 ml).  Cont bb.  Consider acei/arb as outpt if renal fxn stable.  Would not add today.  Signed, Murray Hodgkins NP   I have examined the patient and reviewed assessment and plan and discussed with patient.  Agree with above as stated.  Right groin site stable. No hematoma. Palpable dorsalis pedis pulse. Okay for discharge if he is able to walk with cardiac rehabilitation.  Stressed importance of dual antiplatelet therapy. Will need follow-up with cardiology and of his renal function.  Dorrian Doggett S.

## 2015-11-30 NOTE — Progress Notes (Signed)
PT Cancellation Note  Patient Details Name: John Peck MRN: [id number] DOB: Feb 09, 1924   Cancelled Treatment:    Reason Eval/Treat Not Completed: PT screened, no needs identified, will sign off. Patient ambulating 400' with RW with Cardiac Rehab Phase I.  RN reports patient ambulated with NT earlier with no loss of balance.  Per Cardiac Rehab note, recommended Cardiac Rehab Phase II at discharge.  Patient also to have [place].    Patient to d/c today.  PT will sign off.   Despina Pole 11/30/2015, 12:12 PM Carita Pian Sanjuana Kava, [place] Pager [phone]

## 2015-11-30 NOTE — Discharge Instructions (Signed)
**  PLEASE REMEMBER TO BRING ALL OF YOUR MEDICATIONS TO EACH OF YOUR FOLLOW-UP OFFICE VISITS.  NO HEAVY LIFTING OR SEXUAL ACTIVITY X 7 DAYS. NO DRIVING X 3-5 DAYS. NO SOAKING BATHS, HOT TUBS, POOLS, ETC., X 7 DAYS.  Groin Site Care Refer to this sheet in the next few weeks. These instructions provide you with information on caring for yourself after your procedure. Your caregiver may also give you more specific instructions. Your treatment has been planned according to current medical practices, but problems sometimes occur. Call your caregiver if you have any problems or questions after your procedure. HOME CARE INSTRUCTIONS  You may shower 24 hours after the procedure. Remove the bandage (dressing) and gently wash the site with plain soap and water. Gently pat the site dry.   Do not apply powder or lotion to the site.   Do not sit in a bathtub, swimming pool, or whirlpool for 5 to 7 days.   No bending, squatting, or lifting anything over 10 pounds (4.5 kg) as directed by your caregiver.   Inspect the site at least twice daily.   Do not drive home if you are discharged the same day of the procedure. Have someone else drive you.   What to expect:  Any bruising will usually fade within 1 to 2 weeks.   Blood that collects in the tissue (hematoma) may be painful to the touch. It should usually decrease in size and tenderness within 1 to 2 weeks.  SEEK IMMEDIATE MEDICAL CARE IF:  You have unusual pain at the groin site or down the affected leg.   You have redness, warmth, swelling, or pain at the groin site.   You have drainage (other than a small amount of blood on the dressing).   You have chills.   You have a fever or persistent symptoms for more than 72 hours.   You have a fever and your symptoms suddenly get worse.   Your leg becomes pale, cool, tingly, or numb.  You have heavy bleeding from the site. Hold pressure on the site.      10 Habits of Highly Healthy  [place] [facility] wants to help you get well and stay well.  Live a longer, healthier life by practicing healthy habits every day.  1.  Visit your primary care provider regularly. 2.  Make time for family and friends.  Healthy relationships are important. 3.  Take medications as directed by your provider. 4.  Maintain a healthy weight and a trim waistline. 5.  Eat healthy meals and snacks, rich in fruits, vegetables, whole grains, and lean proteins. 6.  Get moving every day - aim for 150 minutes of moderate physical activity each week. 7.  Don't smoke. 8.  Avoid alcohol or drink in moderation. 9.  Manage stress through meditation or mindful relaxation. 10.  Get seven to nine hours of quality sleep each night.  Want more information on healthy habits?  To learn more about these and other healthy habits, visit [employer]. _____________    Continue aspirin and Plavix for 1 year

## 2015-12-02 ENCOUNTER — Encounter (HOSPITAL_COMMUNITY): Payer: Self-pay | Admitting: Cardiovascular Disease

## 2015-12-03 ENCOUNTER — Telehealth: Payer: Self-pay | Admitting: Internal Medicine

## 2015-12-03 NOTE — Telephone Encounter (Signed)
New Message [facility])  Per staff messages from Danna Hefty   2 wk f/u with TK or APP. 7-14 day TOC ok  Appt scheduled: 12/12/2015 Thu 10:00 A 30 EST POST [facility] [[id number]] [place], [place] [phone]

## 2015-12-03 NOTE — Telephone Encounter (Signed)
Patient contacted regarding discharge from [facility] on 11/30/15.  Patient understands to follow up with provider John Copa PA-C on 12/12/15 at 1000 at [facility] location. Patient understands discharge instructions? yes Patient understands medications and regiment? yes Patient understands to bring all medications to this visit? yes  Pts wife John Peck talked for him (noted on DPR), for she states that the pt is currently resting at this moment.  Per the pts spouse, the pt is doing much better and has no further cardiac complaints since being discharged from the hospital on 11/30/15.  Pts wife had no further questions to ask.  Pts wife verbalized understanding of TOC call follow-up, and agrees with this plan.  Wife gracious for the follow-up call.

## 2015-12-10 ENCOUNTER — Encounter: Payer: Self-pay | Admitting: Physician Assistant

## 2015-12-10 NOTE — Progress Notes (Signed)
Cardiology Office Note Date:  12/12/2015  Patient ID:  John Peck, John Peck 02/19/1924, MRN [id number] PCP:  Patricia Nettle, MD  Cardiologist: Dr. Claiborne Billings Electrophysiologist: Dr. Lovena Le   Chief Complaint: f/u stenting  History of Present Illness: John Peck is a 82 y.o. male with history of CAD (CABG 1989, PCI to RCA in 2004, DES to RCA 11/2015), chronic combined CHF, COPD, paroxysmal atrial fibrillation (not on anticoag due to prior GIB, CHADSVASC 5), SSS s/p St. Jude pacemaker 2009, CKD stage III, iron deficiency anemia, reactive airway disease, hypertensive heart disease, PVD, HLD, LBBB who presents for post-hospital followup.   He was admitted 11/2015 with chest pain and SOB. He ruled out for MI. He was treated for COPD exacerbation. LHC 11/29/15 showed occluded mid LAD (presumed to fill distally from IMA graft but could not engage this graft; IMA graft patent by cath 2015), Cx with mild proximal disease, RCA with patent proximal stent with minimal restenosis and severe distal stenosis -> s/p successful PTCA/DES x 1 distal RCA. 2D echo 11/28/15 showed EF 50-55% with hypokinesis of apical myocardium, mildly thickened AV, mildly dilated LA (prior EF 55-60% by echo 05/2015, previously said to be 45% before then). It was recommended to continue with ASA and Plavix for 1 year.  He presents for f/u overall feeling better since discharge but has still had some orthopnea. He saw his PCP several days ago at which time his Lasix was increased to 40mg  daily. Since that time he's been able to walk around [place] and home with less SOB. He doesn't think he is totally back to baseline though does admit he doesn't get much physical activity and has been fairly sedentary since discharge. No LEE apparent on exam today. No recurrent chest pain. No dizziness or syncope. He drinks 7-8 eight-ounce bottles of water a day.    Past Medical History  Diagnosis Date  . Hypertensive heart disease   . Pacemaker Sept  2009    St Jude  . CAD (coronary artery disease)     a. CABG '89; b. PCI '04; c. 11/2015 Cath/PCI: LM 20ost, LAD 153m, LCX 30p, OM2 40, RCA 30p, 10p ISR, 90d (3.0x12 Synergy DES), AM 90, VG->OM2 known to be 100, LIMA->LAD not injected, patent in 2015.  Marland Kitchen PAF (paroxysmal atrial fibrillation) (HCC)     a. not on anticoag due to prior GIB.  Marland Kitchen Chronic combined systolic and diastolic CHF (congestive heart failure) ([place])   . Anemia   . Sick sinus syndrome [facility]) Sept 2009    ST Jude PTVDP  . History of COPD   . H/O: GI bleed     REMOTE HISTORY  . Hyperlipidemia   . CKD (chronic kidney disease), stage III   . LBBB (left bundle branch block)     Past Surgical History  Procedure Laterality Date  . Coronary artery bypass graft  1989    had LIMA to his LAD, a vein to the circumflex. In 2004 underwent stenting to his right coronary artery.  . Cardiac catheterization  11/2011    EF 50%; significant native CAD w/80% stenosis of the LAD after 2nd giagonal vessel and septal perforating artery w/total occlusion of the mid left anterior descendiung.; 60-70% ostiasl stenosis in the circumflex vessel followed by 40% proximal stenosis and 70-80% distal circumflex stenosis;   . Cataract extraction  2004  . Prostatectomy    . Hemorrhoid surgery    . Insert / replace / remove pacemaker  07/17/08  DUAL-CHAMBER; PPM-ST.JUDE MEDNET  . Coronary angioplasty with stent placement  2004    RCA  . Left heart catheterization with coronary/graft angiogram N/A 12/10/2011    Procedure: LEFT HEART CATHETERIZATION WITH Beatrix Fetters;  Surgeon: Troy Sine, MD;  Location: [facility] CATH LAB;  Service: Cardiovascular;  Laterality: N/A;  . Left heart catheterization with coronary/graft angiogram N/A 01/26/2014    Procedure: LEFT HEART CATHETERIZATION WITH Beatrix Fetters;  Surgeon: Troy Sine, MD;  Location: [facility] CATH LAB;  Service: Cardiovascular;  Laterality: N/A;  . Cardiac catheterization N/A 11/29/2015     Procedure: Left Heart Cath and Cors/Grafts Angiography;  Surgeon: Burnell Blanks, MD;  Location: [place] CV LAB;  Service: Cardiovascular;  Laterality: N/A;  . Cardiac catheterization N/A 11/29/2015    Procedure: Coronary Stent Intervention;  Surgeon: Burnell Blanks, MD;  Location: [place] CV LAB;  Service: Cardiovascular;  Laterality: N/A;    Current Outpatient Prescriptions  Medication Sig Dispense Refill  . aspirin 81 MG tablet Take 81 mg by mouth daily.     Marland Kitchen atorvastatin (LIPITOR) 40 MG tablet Take 1 tablet (40 mg total) by mouth daily. 30 tablet 6  . carvedilol (COREG) 25 MG tablet Take 25 mg by mouth 2 (two) times daily with a meal.      . clopidogrel (PLAVIX) 75 MG tablet Take 75 mg by mouth daily at 3 pm.     . ferrous sulfate 325 (65 FE) MG tablet Take 325 mg by mouth 3 (three) times daily with meals.     . furosemide (LASIX) 20 MG tablet Take 40 mg by mouth daily.     . isosorbide mononitrate (IMDUR) 60 MG 24 hr tablet TAKE 2 TABLETS BY MOUTH EVERY MORNING AND 1 TABLET IN THE EVENING (Patient taking differently: Take 30-60 mg by mouth 2 (two) times daily. TAKE 1 tablet in the morning Take 0.5 tablet in the evening) 270 tablet 1  . latanoprost (XALATAN) 0.005 % ophthalmic solution Place 1 drop into both eyes daily.  4  . lubiprostone (AMITIZA) 24 MCG capsule Take 24 mcg by mouth daily as needed. For bowel movement    . Menthol, Topical Analgesic, (ICY HOT EX) Apply 1 application topically daily as needed (aches and pains).    Marland Kitchen NITROSTAT 0.4 MG SL tablet PLACE 1 TABLET UNDER THE TONGUE AS NEEDED. 25 tablet 4  . pantoprazole (PROTONIX) 40 MG tablet Take 40 mg by mouth 2 (two) times daily.    . ranolazine (RANEXA) 1000 MG SR tablet Take 1 tablet (1,000 mg total) by mouth 2 (two) times daily. 180 tablet 1  . tamsulosin (FLOMAX) 0.4 MG CAPS capsule Take 0.4 mg by mouth daily.  2   No current facility-administered medications for this visit.    Allergies:    Review of patient's allergies indicates no known allergies.   Social History:  The patient  reports that he has quit smoking. His smoking use included Cigarettes. He has a 130 pack-year smoking history. He quit smokeless tobacco use about 7 years ago. He reports that he does not drink alcohol or use illicit drugs.   Family History:  The patient's family history includes Diabetes in his father; Heart attack in his brother; Heart disease in his brother and sister; Stroke in his brother. There is no history of Hypertension.  ROS:  Please see the history of present illness.  All other systems are reviewed and otherwise negative.   PHYSICAL EXAM:  VS:  BP 100/70 mmHg  Pulse 64  Ht 5\' 8"  (1.727 m)  Wt 173 lb 6.4 oz (78.654 kg)  BMI 26.37 kg/m2 BMI: Body mass index is 26.37 kg/(m^2). Well nourished, well developed AAM in no acute distress HEENT: normocephalic, atraumatic Neck: no JVD, carotid bruits or masses Cardiac:  normal S1, S2; RRR; no murmurs, rubs, or gallops Lungs:  clear to auscultation bilaterally, no wheezing, rhonchi or rales Abd: soft, nontender, no hepatomegaly, + BS MS: no deformity or atrophy Ext: no edema Skin: warm and dry, no rash Neuro:  moves all extremities spontaneously, no focal abnormalities noted, follows commands Psych: euthymic mood, full affect  EKG:  Done today shows electronic ventricular pacemaker 65bpm - AV paced  Recent Labs: 09/02/2015: TSH 5.920* 10/02/2015: B Natriuretic Peptide 475.4* 11/30/2015: ALT 20; BUN 24*; Creatinine, Ser 1.41*; Hemoglobin 10.3*; Platelets 139*; Potassium 3.6; Sodium 138  11/01/2015: Cholesterol 149; HDL 44; LDL Cholesterol 87; Total CHOL/HDL Ratio 3.4; Triglycerides 90; VLDL 18   Estimated Creatinine Clearance: 34.4 mL/min (by C-G formula based on Cr of 1.41).   Wt Readings from Last 3 Encounters:  12/12/15 173 lb 6.4 oz (78.654 kg)  11/30/15 177 lb 11.1 oz (80.6 kg)  10/22/15 175 lb (79.379 kg)     Other studies  reviewed: Additional studies/records reviewed today include: summarized above  ASSESSMENT AND PLAN:  1. CAD - no further chest pain since PCI. Continue ASA, BB, statin, Plavix. He declined cardiac rehab, citing he has "too many doctor's appointments" to fit this in. I encouraged him to participate in slowly building up regular physical activity to prevent further deconditioning. 2. Chronic combined CHF - Lasix just recently increased by PCP for symptoms of orthopnea. Weight down 4lbs since discharge. Continue current regimen. We discussed fluid restriction, low sodium diet and daily weights. If orthopnea returns or dyspnea persists despite above measures, may consider decreasing Coreg to allow further BP room to titrate diuretic. He does not appear volume overloaded on exam today. 3. PAF with SSS s/p pacemaker - will make sure he has EP f/u in 01/2016 as previously recommended. 4. Essential HTN - BP running on the softer side, but asymptomatic. Will follow for now. See above. 5. CKD stage III - monitoring per primary care.  Disposition: F/u with Dr. Claiborne Billings in 3 months.  Current medicines are reviewed at length with the patient today.  The patient did not have any concerns regarding medicines.  Raechel Ache PA-C 12/12/2015 10:46 AM     [facility] [place] [id number] [phone] (office)  [phone] (fax)

## 2015-12-12 ENCOUNTER — Telehealth (HOSPITAL_COMMUNITY): Payer: Self-pay | Admitting: *Deleted

## 2015-12-12 ENCOUNTER — Ambulatory Visit (INDEPENDENT_AMBULATORY_CARE_PROVIDER_SITE_OTHER): Payer: Medicare HMO | Admitting: Physician Assistant

## 2015-12-12 ENCOUNTER — Encounter: Payer: Self-pay | Admitting: Physician Assistant

## 2015-12-12 VITALS — BP 100/70 | HR 64 | Ht 68.0 in | Wt 173.4 lb

## 2015-12-12 DIAGNOSIS — N183 Chronic kidney disease, stage 3 unspecified: Secondary | ICD-10-CM

## 2015-12-12 DIAGNOSIS — I5042 Chronic combined systolic (congestive) and diastolic (congestive) heart failure: Secondary | ICD-10-CM | POA: Diagnosis not present

## 2015-12-12 DIAGNOSIS — I495 Sick sinus syndrome: Secondary | ICD-10-CM | POA: Diagnosis not present

## 2015-12-12 DIAGNOSIS — I2510 Atherosclerotic heart disease of native coronary artery without angina pectoris: Secondary | ICD-10-CM | POA: Diagnosis not present

## 2015-12-12 DIAGNOSIS — I480 Paroxysmal atrial fibrillation: Secondary | ICD-10-CM

## 2015-12-12 DIAGNOSIS — I10 Essential (primary) hypertension: Secondary | ICD-10-CM

## 2015-12-12 DIAGNOSIS — Z9861 Coronary angioplasty status: Secondary | ICD-10-CM

## 2015-12-12 NOTE — Telephone Encounter (Signed)
Confirmed with pt that he would like to decline attending cardiac rehab due having multiple appointments and transportation issues.  Cherre Huger, BSN

## 2015-12-12 NOTE — Patient Instructions (Signed)
Medication Instructions:  None  Labwork: None  Testing/Procedures: None  Follow-Up: Your physician recommends that you schedule a follow-up appointment in: 3 months with Dr. Claiborne Billings.  Your physician recommends that you keep your scheduled a follow-up appointment for March 13 for your pacer check.   Any Other Special Instructions Will Be Listed Below (If Applicable).     If you need a refill on your cardiac medications before your next appointment, please call your pharmacy.

## 2015-12-19 ENCOUNTER — Ambulatory Visit (HOSPITAL_BASED_OUTPATIENT_CLINIC_OR_DEPARTMENT_OTHER): Payer: Medicare HMO | Admitting: Oncology

## 2015-12-19 ENCOUNTER — Other Ambulatory Visit (HOSPITAL_BASED_OUTPATIENT_CLINIC_OR_DEPARTMENT_OTHER): Payer: Medicare HMO

## 2015-12-19 ENCOUNTER — Telehealth: Payer: Self-pay | Admitting: Oncology

## 2015-12-19 VITALS — BP 128/57 | HR 58 | Temp 97.8°F | Resp 18 | Ht 68.0 in | Wt 173.2 lb

## 2015-12-19 DIAGNOSIS — D696 Thrombocytopenia, unspecified: Secondary | ICD-10-CM

## 2015-12-19 DIAGNOSIS — Z862 Personal history of diseases of the blood and blood-forming organs and certain disorders involving the immune mechanism: Secondary | ICD-10-CM

## 2015-12-19 DIAGNOSIS — K5900 Constipation, unspecified: Secondary | ICD-10-CM

## 2015-12-19 DIAGNOSIS — D649 Anemia, unspecified: Secondary | ICD-10-CM | POA: Diagnosis not present

## 2015-12-19 DIAGNOSIS — D509 Iron deficiency anemia, unspecified: Secondary | ICD-10-CM

## 2015-12-19 DIAGNOSIS — J449 Chronic obstructive pulmonary disease, unspecified: Secondary | ICD-10-CM

## 2015-12-19 DIAGNOSIS — N289 Disorder of kidney and ureter, unspecified: Secondary | ICD-10-CM | POA: Diagnosis not present

## 2015-12-19 DIAGNOSIS — I10 Essential (primary) hypertension: Secondary | ICD-10-CM

## 2015-12-19 DIAGNOSIS — E785 Hyperlipidemia, unspecified: Secondary | ICD-10-CM

## 2015-12-19 LAB — CBC WITH DIFFERENTIAL/PLATELET
BASO%: 0.8 % (ref 0.0–2.0)
Basophils Absolute: 0 10*3/uL (ref 0.0–0.1)
EOS ABS: 0.2 10*3/uL (ref 0.0–0.5)
EOS%: 7.3 % — ABNORMAL HIGH (ref 0.0–7.0)
HEMATOCRIT: 31.9 % — AB (ref 38.4–49.9)
HEMOGLOBIN: 10.5 g/dL — AB (ref 13.0–17.1)
LYMPH#: 0.8 10*3/uL — AB (ref 0.9–3.3)
LYMPH%: 23.9 % (ref 14.0–49.0)
MCH: 31.5 pg (ref 27.2–33.4)
MCHC: 32.8 g/dL (ref 32.0–36.0)
MCV: 95.9 fL (ref 79.3–98.0)
MONO#: 0.5 10*3/uL (ref 0.1–0.9)
MONO%: 14.8 % — ABNORMAL HIGH (ref 0.0–14.0)
NEUT%: 53.2 % (ref 39.0–75.0)
NEUTROS ABS: 1.8 10*3/uL (ref 1.5–6.5)
PLATELETS: 114 10*3/uL — AB (ref 140–400)
RBC: 3.33 10*6/uL — AB (ref 4.20–5.82)
RDW: 15 % — AB (ref 11.0–14.6)
WBC: 3.4 10*3/uL — AB (ref 4.0–10.3)

## 2015-12-19 NOTE — Progress Notes (Signed)
  [place] OFFICE PROGRESS NOTE   Diagnosis: History of iron deficiency anemia  INTERVAL HISTORY:   John Peck returns as scheduled. He was admitted last month with chest pain and diagnosed with unstable angina. He underwent a cardiac catheterization/percutaneous intervention. He reports 1 episode of chest pain since discharge from the hospital.  John Peck reports mild swelling in the lower legs. No bleeding. He is taking iron 3 times per day. He is constipated.  Objective:  Vital signs in last 24 hours:  Blood pressure 128/57, pulse 58, temperature 97.8 F (36.6 C), temperature source Oral, resp. rate 18, height 5\' 8"  (1.727 m), weight 173 lb 3.2 oz (78.563 kg), SpO2 100 %.    Lymphatics: No cervical, supra-clavicular, axillary, or inguinal nodes Resp: Lungs clear bilaterally Cardio: Regular rate and rhythm GI: No hepatosplenomegaly Vascular: Trace right greater than left low leg edema, no erythema or tenderness      Lab Results:  Lab Results  Component Value Date   WBC 3.4* 12/19/2015   HGB 10.5* 12/19/2015   HCT 31.9* 12/19/2015   MCV 95.9 12/19/2015   PLT 114* 12/19/2015   NEUTROABS 1.8 12/19/2015     Medications: I have reviewed the patient's current medications.  Assessment/Plan: 1. History of iron deficiency anemia dating to 2007, stool Hemoccult positive in 2012, negative upper and lower endoscopy by Dr. Michail Sermon in 2012  2. History of coronary disease, status post coronary artery bypass surgery, PTCA/stent January 2017  3. Pacemaker placement in 1989  4. Hypertension  5. Dyslipidemia  6. BPH, status post TURP in 1992  7. History of a liver abscess  8. COPD  9. Thrombocytopenia  10. Renal insufficiency  Disposition:  John Peck has persistent anemia. I suspect the anemia is secondary to chronic renal failure or potentially myelodysplasia. He also has chronic mild thrombocytopenia. He is taking iron 3 times a day and  reports constipation. He will decreased iron to once daily and return for an office visit in 3 months. He will contact us for bleeding.    Betsy Coder, MD  12/19/2015  12:22 PM

## 2015-12-19 NOTE — Telephone Encounter (Signed)
per pof to sch pt appt-gave pt copy of avs °

## 2016-01-07 ENCOUNTER — Encounter: Payer: Self-pay | Admitting: Internal Medicine

## 2016-01-07 DIAGNOSIS — I495 Sick sinus syndrome: Secondary | ICD-10-CM | POA: Diagnosis not present

## 2016-03-17 ENCOUNTER — Ambulatory Visit (HOSPITAL_BASED_OUTPATIENT_CLINIC_OR_DEPARTMENT_OTHER): Payer: Medicare HMO | Admitting: Nurse Practitioner

## 2016-03-17 ENCOUNTER — Other Ambulatory Visit (HOSPITAL_BASED_OUTPATIENT_CLINIC_OR_DEPARTMENT_OTHER): Payer: Medicare HMO

## 2016-03-17 ENCOUNTER — Telehealth: Payer: Self-pay | Admitting: Oncology

## 2016-03-17 VITALS — BP 146/73 | HR 59 | Temp 97.5°F | Resp 18 | Ht 68.0 in | Wt 168.9 lb

## 2016-03-17 DIAGNOSIS — D509 Iron deficiency anemia, unspecified: Secondary | ICD-10-CM

## 2016-03-17 DIAGNOSIS — D61818 Other pancytopenia: Secondary | ICD-10-CM | POA: Diagnosis not present

## 2016-03-17 LAB — CBC WITH DIFFERENTIAL/PLATELET
BASO%: 0.6 % (ref 0.0–2.0)
BASOS ABS: 0 10*3/uL (ref 0.0–0.1)
EOS%: 7.2 % — AB (ref 0.0–7.0)
Eosinophils Absolute: 0.3 10*3/uL (ref 0.0–0.5)
HEMATOCRIT: 36.3 % — AB (ref 38.4–49.9)
HGB: 11.9 g/dL — ABNORMAL LOW (ref 13.0–17.1)
LYMPH#: 0.9 10*3/uL (ref 0.9–3.3)
LYMPH%: 22.7 % (ref 14.0–49.0)
MCH: 31.6 pg (ref 27.2–33.4)
MCHC: 32.9 g/dL (ref 32.0–36.0)
MCV: 96 fL (ref 79.3–98.0)
MONO#: 0.6 10*3/uL (ref 0.1–0.9)
MONO%: 16.4 % — AB (ref 0.0–14.0)
NEUT#: 2 10*3/uL (ref 1.5–6.5)
NEUT%: 53.1 % (ref 39.0–75.0)
Platelets: 119 10*3/uL — ABNORMAL LOW (ref 140–400)
RBC: 3.78 10*6/uL — AB (ref 4.20–5.82)
RDW: 14.6 % (ref 11.0–14.6)
WBC: 3.8 10*3/uL — ABNORMAL LOW (ref 4.0–10.3)

## 2016-03-17 LAB — FERRITIN: FERRITIN: 243 ng/mL (ref 22–316)

## 2016-03-17 NOTE — Telephone Encounter (Signed)
Gave pt appt & avs °

## 2016-03-17 NOTE — Progress Notes (Signed)
  [place] OFFICE PROGRESS NOTE   Diagnosis:  History of iron deficiency anemia  INTERVAL HISTORY:   Mr. Spampinato returns as scheduled. No interim illnesses or infections. He continues oral iron twice a day. No bleeding. Stable dyspnea exertion. He has occasional chest pain.  Objective:  Vital signs in last 24 hours:  Blood pressure 146/73, pulse 59, temperature 97.5 F (36.4 C), temperature source Oral, resp. rate 18, height 5\' 8"  (1.727 m), weight 168 lb 14.4 oz (76.613 kg), SpO2 98 %.    HEENT: No thrush or ulcers. Resp: Lungs clear bilaterally. Cardio: Regular rate and rhythm. GI: Abdomen soft and nontender. No organomegaly. Vascular: No leg edema.  Lab Results:  Lab Results  Component Value Date   WBC 3.8* 03/17/2016   HGB 11.9* 03/17/2016   HCT 36.3* 03/17/2016   MCV 96.0 03/17/2016   PLT 119* 03/17/2016   NEUTROABS 2.0 03/17/2016    Imaging:  No results found.  Medications: I have reviewed the patient's current medications.  Assessment/Plan: 1. History of iron deficiency anemia dating to 2007, stool Hemoccult positive in 2012, negative upper and lower endoscopy by Dr. Michail Sermon in 2012  2. History of coronary disease, status post coronary artery bypass surgery, PTCA/stent January 2017  3. Pacemaker placement in 1989  4. Hypertension  5. Dyslipidemia  6. BPH, status post TURP in 1992  7. History of a liver abscess  8. COPD  9. Thrombocytopenia  10. Renal insufficiency   Disposition: Mr. Erdos remains stable from a hematologic standpoint. We discussed that he has mild pancytopenia which may be due to myelodysplasia. He will decrease oral iron to once daily. He will return for a follow-up visit and CBC in 4 months.  Plan reviewed with Dr. Benay Spice.    Ned Card ANP/GNP-BC   03/17/2016  9:45 AM

## 2016-03-19 ENCOUNTER — Ambulatory Visit (INDEPENDENT_AMBULATORY_CARE_PROVIDER_SITE_OTHER): Payer: Medicare HMO | Admitting: Cardiovascular Disease

## 2016-03-19 VITALS — BP 155/68 | HR 60 | Ht 60.0 in | Wt 170.6 lb

## 2016-03-19 DIAGNOSIS — I2510 Atherosclerotic heart disease of native coronary artery without angina pectoris: Secondary | ICD-10-CM

## 2016-03-19 DIAGNOSIS — Z79899 Other long term (current) drug therapy: Secondary | ICD-10-CM

## 2016-03-19 DIAGNOSIS — E785 Hyperlipidemia, unspecified: Secondary | ICD-10-CM | POA: Diagnosis not present

## 2016-03-19 DIAGNOSIS — I10 Essential (primary) hypertension: Secondary | ICD-10-CM | POA: Diagnosis not present

## 2016-03-19 DIAGNOSIS — I25810 Atherosclerosis of coronary artery bypass graft(s) without angina pectoris: Secondary | ICD-10-CM

## 2016-03-19 DIAGNOSIS — Z951 Presence of aortocoronary bypass graft: Secondary | ICD-10-CM

## 2016-03-19 DIAGNOSIS — Z950 Presence of cardiac pacemaker: Secondary | ICD-10-CM

## 2016-03-19 MED ORDER — AMLODIPINE BESYLATE 5 MG PO TABS: 5.0000 mg | ORAL_TABLET | Freq: Every day | ORAL | Status: DC

## 2016-03-19 NOTE — Patient Instructions (Signed)
Your physician recommends that you return for lab work prior to next appointment.  Your physician has recommended you make the following change in your medication: start new prescription for amlodipine.  Your physician recommends that you schedule a follow-up appointment in: 3 months with Dr Claiborne Billings.

## 2016-03-21 ENCOUNTER — Encounter: Payer: Self-pay | Admitting: Cardiovascular Disease

## 2016-03-21 NOTE — Progress Notes (Signed)
Patient ID: John Peck, male   DOB: 09/15/1924, 82 y.o.   MRN: [id number]      HPI: John Peck is a 82 y.o. male who presents to the office today for a 6 month follow-up cardiology evaluation.  Mr. Chavarin has  CAD and underwent CABG surgery with LIMA to the LAD, a vein to the circumflex in 1989.  In 2004 he underwent stenting to his RCA. Cardiac catheterization in January 2013 showed an ejection fraction of 50% with mild inferior, distal inferior and apical lateral hypocontractility. He had significant native CAD with 80% stenosis of the LAD after the second diagonal vessel and septal perforator artery with total occlusion of the mid LAD. He was 11 - 70% ostial stenosis in the circumflex vessel followed by 40% proximal stenosis and 7080% distal stenosis. He had an occluded Y graft which previously sequentially supplied the intermediate and distal circumflex.There was a patent stented proximal RCA with 90% stenosis in the anterior RV marginal branch arising from the mid RCA and had 70-80% stenosis in the distal RCA beyond the crux with 78% stenosis the PDA takeoff. His LIMA to LAD was patent. He has been on medical therapy. He has a history of atrial fibrillation, sick sinus syndrome and status post permanent pacemaker in 2009 which is a St. Jude device. Additional problems include iron deficiency anemia, hypertension, BPH, status post TURP in 1992, remote history of liver abscess, history of COPD.  In March 2015 due to recurrent increasing symptoms of chest pain he underwent repeat cardiac catheterization which revealed mild LV dysfunction with an EF of 45% inferior wall hypocontractility. There was significant multivessel native coronary obstructive disease with 50% ostial LAD stenosis followed by 80% proximal stenosis in the region of the first diagonal takeoff followed by 80% stenosis after the second diagonal vessel; total occlusion of the ramus intermediate vessel with evidence for  bidirectional retrograde collateralization; 40% ostial left circumflex stenosis; and widely patent proximal RCA stent with 80% stenosis in the anterior RV marginal branch, 40% mid or CVA stenosis, 50% distal, and 50% ostial and mid PDA stenoses. He had a patent LIMA graft supplying the mid LAD and an occluded Y graft which previously supplied the intermediate and distal circumflex.  He has been on increased medical regimen consisting of aspirin, Plavix, carvedilol 25 mg twice a day, high dose oral nitrate therapy, Ranexa 1000 g twice a day.  Amlodipine 10 mg in addition to Crestor 20 mg.  He also is taking Lasix 20 mg with supplemental potassium.  He was seen in the office by Lesia Hausen and admitted to bilateral lower extremity pain and weakness.  His blood pressure was somewhat low and his amlodipine dose was reduced from 10 to 5 mg. Lower extremity Doppler studies showed an ABI of 0.5 on the right and 0.58 on the left.  He had mild dilatation of his distal abdominal aorta.  There was equal to less than 50% diameter reduction.  The right SFA and right popliteal artery and there was a greater than 60% diameter reduction in the left SFA.  However, there was one vessel runoff via the peroneal artery bilaterally.  His symptoms are worse in the right lower extremity due to his distal disease.  He has  had done well with his increased medical regimen including Ranexa, high-dose nitrate therapy, amlodipine and carvedilol.  A follow-up Doppler study in August 2015 showed ABI of 0.5 on the right and 0.58 on the left.  He has  poor distal runoff.  His Doppler studies were reviewed with Dr. Gwenlyn Found who agreed with medical therapy.  He already was on aspirin and Plavix  Since I last saw him, he was hospitalized in January 2017 with chest pain and shortness of breath.  He without for an MI.  He was treated for COPD exacerbation.  He underwent repeat cardiac catheterization by Dr. Julianne Handler.  He was found to have an  occluded mid LAD with presumptive filling of the distal LAD by the LIMA graft, although during this catheterization.  He was unable to selectively engage the LIMA graft.  He had mild proximal disease in the circumflex vessel.  There was a widely patent stent in the RCA, but he developed a new distal RCA stenosis for which he underwent successful intervention.  He also had 90%.  RV marginal stenosis esters Munshi still notes rare to occasional episodes of chest pain which do improve following sl nitroglycerin administration.  He has had some mild blood pressure lability.  He presents for follow-up evaluation.  Past Medical History  Diagnosis Date  . Hypertensive heart disease   . Pacemaker Sept 2009    St Jude  . CAD (coronary artery disease)     a. CABG '89; b. PCI '04; c. 11/2015 Cath/PCI: LM 20ost, LAD 18m LCX 30p, OM2 40, RCA 30p, 10p ISR, 90d (3.0x12 Synergy DES), AM 90, VG->OM2 known to be 100, LIMA->LAD not injected, patent in 2015.  .Marland KitchenPAF (paroxysmal atrial fibrillation) (HCC)     a. not on anticoag due to prior GIB.  .Marland KitchenChronic combined systolic and diastolic CHF (congestive heart failure) ([facility]   . Anemia   . Sick sinus syndrome ([facility] Sept 2009    ST Jude PTVDP  . History of COPD   . H/O: GI bleed     REMOTE HISTORY  . Hyperlipidemia   . CKD (chronic kidney disease), stage III   . LBBB (left bundle branch block)     Past Surgical History  Procedure Laterality Date  . Coronary artery bypass graft  1989    had LIMA to his LAD, a vein to the circumflex. In 2004 underwent stenting to his right coronary artery.  . Cardiac catheterization  11/2011    EF 50%; significant native CAD w/80% stenosis of the LAD after 2nd giagonal vessel and septal perforating artery w/total occlusion of the mid left anterior descendiung.; 60-70% ostiasl stenosis in the circumflex vessel followed by 40% proximal stenosis and 70-80% distal circumflex stenosis;   . Cataract extraction  2004  . Prostatectomy     . Hemorrhoid surgery    . Insert / replace / remove pacemaker  07/17/08    DUAL-CHAMBER; PPM-ST.JUDE MEDNET  . Coronary angioplasty with stent placement  2004    RCA  . Left heart catheterization with coronary/graft angiogram N/A 12/10/2011    Procedure: LEFT HEART CATHETERIZATION WITH CBeatrix Fetters  Surgeon: TTroy Sine MD;  [facility];  Service: Cardiovascular;  Laterality: N/A;  . Left heart catheterization with coronary/graft angiogram N/A 01/26/2014    Procedure: LEFT HEART CATHETERIZATION WITH CBeatrix Fetters  Surgeon: TTroy Sine MD;  [facility];  Service: Cardiovascular;  Laterality: N/A;  . Cardiac catheterization N/A 11/29/2015    Procedure: Left Heart Cath and Cors/Grafts Angiography;  Surgeon: CBurnell Blanks MD;  [facility];  Service: Cardiovascular;  Laterality: N/A;  . Cardiac catheterization N/A 11/29/2015    Procedure: Coronary Stent Intervention;  Surgeon:  Burnell Blanks, MD;  Location: [place] [facility];  Service: Cardiovascular;  Laterality: N/A;    No Known Allergies  Current Outpatient Prescriptions  Medication Sig Dispense Refill  . aspirin 81 MG tablet Take 81 mg by mouth daily.     Marland Kitchen atorvastatin (LIPITOR) 40 MG tablet Take 1 tablet (40 mg total) by mouth daily. 30 tablet 6  . carvedilol (COREG) 25 MG tablet Take 25 mg by mouth 2 (two) times daily with a meal.      . clopidogrel (PLAVIX) 75 MG tablet Take 75 mg by mouth daily at 3 pm.     . ferrous sulfate 325 (65 FE) MG tablet Take 325 mg by mouth 3 (three) times daily with meals.     . furosemide (LASIX) 20 MG tablet Take 40 mg by mouth daily.     . isosorbide mononitrate (IMDUR) 60 MG 24 hr tablet TAKE 2 TABLETS BY MOUTH EVERY MORNING AND 1 TABLET IN THE EVENING (Patient taking differently: Take 30-60 mg by mouth 2 (two) times daily. TAKE 1 tablet in the morning Take 0.5 tablet in the evening) 270 tablet 1  . latanoprost  (XALATAN) 0.005 % ophthalmic solution Place 1 drop into both eyes daily.  4  . lubiprostone (AMITIZA) 24 MCG capsule Take 24 mcg by mouth daily as needed. For bowel movement    . Menthol, Topical Analgesic, (ICY HOT EX) Apply 1 application topically daily as needed (aches and pains).    Marland Kitchen NITROSTAT 0.4 MG SL tablet PLACE 1 TABLET UNDER THE TONGUE AS NEEDED. 25 tablet 4  . pantoprazole (PROTONIX) 40 MG tablet Take 40 mg by mouth 2 (two) times daily.    . ranolazine (RANEXA) 1000 MG SR tablet Take 1 tablet (1,000 mg total) by mouth 2 (two) times daily. 180 tablet 1  . tamsulosin (FLOMAX) 0.4 MG CAPS capsule Take 0.4 mg by mouth daily.  2  . amLODipine (NORVASC) 5 MG tablet Take 1 tablet (5 mg total) by mouth daily. 30 tablet 11   No current facility-administered medications for this visit.    Social History   Social History  . Marital Status: Married    Spouse Name: N/A  . Number of Children: 5  . Years of Education: N/A   Occupational History  . RETIRED     TRUCK DRIVER   Social History Main Topics  . Smoking status: Former Smoker -- 2.00 packs/day for 65 years    Types: Cigarettes  . Smokeless tobacco: Former Systems developer    Quit date: 10/29/2008  . Alcohol Use: No  . Drug Use: No  . Sexual Activity: Not on file   Other Topics Concern  . Not on file   Social History Narrative    Family History  Problem Relation Age of Onset  . Diabetes Father   . Heart disease Sister   . Heart disease Brother   . Heart attack Brother   . Hypertension Neg Hx   . Stroke Brother     ROS General: Negative; No fevers, chills, or night sweats;  HEENT: Negative; No changes in vision or hearing, sinus congestion, difficulty swallowing Pulmonary: Negative; No cough, wheezing, shortness of breath, hemoptysis Cardiovascular: See history of present illness  Positive for claudication, right lower extremity greater than left. GI: Negative; No nausea, vomiting, diarrhea, or abdominal pain GU:  Negative; No dysuria, hematuria, or difficulty voiding Musculoskeletal: Negative; no myalgias, joint pain, or weakness Hematologic/Oncology: Negative; no easy bruising, bleeding Endocrine: Negative; no heat/cold  intolerance; no diabetes Neuro: Negative; no changes in balance, headaches Skin: Negative; No rashes or skin lesions Psychiatric: Negative; No behavioral problems, depression Sleep: Negative; No snoring, daytime sleepiness, hypersomnolence, bruxism, restless legs, hypnogognic hallucinations, no cataplexy Other comprehensive 14 point system review is negative.   PE BP 155/68 mmHg  Pulse 60  Ht 5' (1.524 m)  Wt 170 lb 9.6 oz (77.384 kg)  BMI 33.32 kg/m2   Wt Readings from Last 3 Encounters:  03/19/16 170 lb 9.6 oz (77.384 kg)  03/17/16 168 lb 14.4 oz (76.613 kg)  12/19/15 173 lb 3.2 oz (78.563 kg)   General: Alert, oriented, no distress.  Skin: normal turgor, no rashes HEENT: Normocephalic, atraumatic. Pupils round and reactive; sclera anicteric;no lid lag.  Nose without nasal septal hypertrophy Mouth/Parynx benign; Mallinpatti scale 3 Neck: No JVD, no carotid bruits; normal carotid upstroke Lungs: clear to ausculatation and percussion; no wheezing or rales Chest wall: Nontender to palpation Heart: RRR, s1 s2 normal 1/6 systolic murmur; no diastolic murmur.  No S3 gallop.  No rubs, thrills or heaves.. Abdomen: Mild central adiposity ;soft, nontender; no hepatosplenomehaly, BS+; abdominal aorta nontender and not dilated by palpation. Back: No CVA tenderness Pulses 2+ upper extremity.  Bilateral femoral bruits.  Decreased pulses distally in the dorsalis pedis. Extremities: no clubbing cyanosis or edema, Homan's sign negative  Neurologic: grossly nonfocal Psychologic: normal affect and mood.  ECG (independently read by me): AV paced rhythm at 60 bpm.  PR interval 202 ms.  October 2016 ECG (independently read by me): AV dual paced rhythm with 100% capture.  Ventricular rate  60 bpm.  PR interval 206 ms.  April 2016 ECG  (Independently read by me): Ventricular paced rhythm at 62 bpm; unchanged  October 2015 ECG  (Independently read by me): Ventricular paced rhythm.  Unchanged from prior ECG.  LABS:  BMP Latest Ref Rng 11/30/2015 11/29/2015 11/27/2015  Glucose 65 - 99 mg/dL 133(H) 141(H) 116(H)  BUN 6 - 20 mg/dL 24(H) 20 15  Creatinine 0.61 - 1.24 mg/dL 1.41(H) 1.25(H) 1.40(H)  Sodium 135 - 145 mmol/L 138 138 138  Potassium 3.5 - 5.1 mmol/L 3.6 3.8 4.1  Chloride 101 - 111 mmol/L 104 101 101  CO2 22 - 32 mmol/L _0 Calcium 8.9 - 10.3 mg/dL 8.3(L) 8.7(L) 8.9    Hepatic Function Latest Ref Rng 11/30/2015 11/01/2015 09/02/2015  Total Protein 6.5 - 8.1 g/dL 5.9(L) 6.5 7.0  Albumin 3.5 - 5.0 g/dL 3.0(L) 3.6 3.7  AST 15 - 41 U/L _1 ALT 17 - 63 U/L 20 7(L) 4(L)  Alk Phosphatase 38 - 126 U/L 65 83 76  Total Bilirubin 0.3 - 1.2 mg/dL 0.8 0.7 0.6  Bilirubin, Direct 0.1 - 0.5 mg/dL - - -     CBC Latest Ref Rng 03/17/2016 12/19/2015 11/30/2015  WBC 4.0 - 10.3 10e3/uL 3.8(L) 3.4(L) 7.5  Hemoglobin 13.0 - 17.1 g/dL 11.9(L) 10.5(L) 10.3(L)  Hematocrit 38.4 - 49.9 % 36.3(L) 31.9(L) 31.0(L)  Platelets 140 - 400 10e3/uL 119(L) 114(L) 139(L)    Lab Results  Component Value Date   TSH 5.920* 09/02/2015      BNP    Component Value Date/Time   PROBNP 304.0* 11/12/2008 1154    Lipid Panel     Component Value Date/Time   CHOL 149 11/01/2015 0828   TRIG 90 11/01/2015 0828   HDL 44 11/01/2015 0828   CHOLHDL 3.4 11/01/2015 0828   VLDL 18 11/01/2015 0828   LDLCALC 87 11/01/2015  0828     RADIOLOGY: No results found.    ASSESSMENT AND PLAN: Mr. Loescher is an 82 year old gentleman who underwent initial bypass surgery in 1989 and stenting to his RCA in 2004.  He had been fairly stable on a medical regimen for his CAD and had developed recurrent chest pain symptomatology in the setting of possible COPD exacerbation in January 2017.  At that time he  was found to have progression of his distal RCA which was successfully stented.  He continues to be on medical therapy for his CAD and has documented occluded graft which had supplied his circumflex.  His blood pressure today is mildly elevated.  With his episodes of rare to occasional chest pain and blood pressure elevation.  I elected to rechallenge him with amlodipine, both for blood pressure and angina control.  He has been on ranolazine 1000 g twice a day, isosorbide 120 mg in the morning and 60 mg at night, carvedilol 25 mg twice a day in addition to furosemide 20 mg.  If he notes some additional leg swelling.  He can take an extra Lasix 20 mg on an as-needed basis.  He is on dual antiplatelet therapy with aspirin and Plavix.  He is on atorvastatin for hyperlipidemia with target LDL less than 70.  I am recommending follow-up labs to be obtained in the fasting state.  Medication adjustment will be made if necessary.  I will see him in 3 months for reevaluation.  Time spent: 25 minutes  Troy Sine, MD, [facility]  03/21/2016 1:18 PM

## 2016-04-09 ENCOUNTER — Encounter: Payer: Self-pay | Admitting: Internal Medicine

## 2016-04-09 ENCOUNTER — Ambulatory Visit (INDEPENDENT_AMBULATORY_CARE_PROVIDER_SITE_OTHER): Payer: Medicare HMO | Admitting: *Deleted

## 2016-04-09 DIAGNOSIS — I480 Paroxysmal atrial fibrillation: Secondary | ICD-10-CM | POA: Diagnosis not present

## 2016-04-09 DIAGNOSIS — I495 Sick sinus syndrome: Secondary | ICD-10-CM

## 2016-04-09 LAB — CBC
HEMATOCRIT: 36.7 % — AB (ref 38.5–50.0)
Hemoglobin: 12.1 g/dL — ABNORMAL LOW (ref 13.2–17.1)
MCH: 31.2 pg (ref 27.0–33.0)
MCHC: 33 g/dL (ref 32.0–36.0)
MCV: 94.6 fL (ref 80.0–100.0)
MPV: 10.3 fL (ref 7.5–12.5)
Platelets: 147 10*3/uL (ref 140–400)
RBC: 3.88 MIL/uL — ABNORMAL LOW (ref 4.20–5.80)
RDW: 14.5 % (ref 11.0–15.0)
WBC: 3.4 10*3/uL — AB (ref 3.8–10.8)

## 2016-04-09 NOTE — Progress Notes (Signed)
Pacemaker check in clinic. Normal device function. Thresholds, sensing, impedances consistent with previous measurements. Device programmed to maximize longevity. 103 mode switches (<1% burden). Known PAF, no anticoagulant d/t age and fall history. Device programmed at appropriate safety margins. Histogram distribution appropriate for patient activity level. Device programmed to optimize intrinsic conduction. Estimated longevity 2.75-3.65yrs. ROV with GT 10/2016.

## 2016-04-10 LAB — COMPREHENSIVE METABOLIC PANEL
ALK PHOS: 92 U/L (ref 40–115)
ALT: 7 U/L — AB (ref 9–46)
AST: 11 U/L (ref 10–35)
Albumin: 4 g/dL (ref 3.6–5.1)
BUN: 26 mg/dL — AB (ref 7–25)
CALCIUM: 9.1 mg/dL (ref 8.6–10.3)
CO2: 27 mmol/L (ref 20–31)
CREATININE: 1.66 mg/dL — AB (ref 0.70–1.11)
Chloride: 102 mmol/L (ref 98–110)
GLUCOSE: 93 mg/dL (ref 65–99)
Potassium: 5.1 mmol/L (ref 3.5–5.3)
SODIUM: 136 mmol/L (ref 135–146)
Total Bilirubin: 0.8 mg/dL (ref 0.2–1.2)
Total Protein: 6.9 g/dL (ref 6.1–8.1)

## 2016-04-10 LAB — TSH: TSH: 6.64 m[IU]/L — AB (ref 0.40–4.50)

## 2016-04-10 LAB — LIPID PANEL
CHOL/HDL RATIO: 3 ratio (ref <=5.0)
Cholesterol: 180 mg/dL (ref 125–200)
HDL: 60 mg/dL (ref >=40)
LDL Cholesterol: 101 mg/dL (ref <130)
Triglycerides: 96 mg/dL (ref <150)
VLDL: 19 mg/dL (ref <30)

## 2016-04-23 ENCOUNTER — Telehealth: Payer: Self-pay | Admitting: *Deleted

## 2016-04-23 NOTE — Telephone Encounter (Signed)
Would then decrease lasix from 20 mg to 10 mg daily.  Dr. Lemmie Evens

## 2016-04-23 NOTE — Telephone Encounter (Signed)
Called to give patient lab results and recommendations. Spoke with wife and she informed me the patient is currently taking 20 mg of furosemide. In formed her that I will consult with Dr Debara Pickett (DOD)  for advice and call her back with further instructions.

## 2016-04-23 NOTE — Telephone Encounter (Signed)
-----   Message from Troy Sine, MD sent at 04/19/2016 10:41 PM EDT ----- Cr inc; Decrease lasix from 40 mg to 20 mg

## 2016-04-23 NOTE — Telephone Encounter (Signed)
Informed wife per Dr Debara Pickett to decrease the furosemide to 10 mg daily. She voiced understanding.

## 2016-07-04 ENCOUNTER — Other Ambulatory Visit: Payer: Self-pay | Admitting: Cardiovascular Disease

## 2016-07-06 NOTE — Telephone Encounter (Signed)
Rx request sent to pharmacy.  

## 2016-07-09 DIAGNOSIS — I495 Sick sinus syndrome: Secondary | ICD-10-CM | POA: Diagnosis not present

## 2016-07-15 ENCOUNTER — Ambulatory Visit (INDEPENDENT_AMBULATORY_CARE_PROVIDER_SITE_OTHER): Payer: Medicare HMO | Admitting: Cardiovascular Disease

## 2016-07-15 ENCOUNTER — Encounter: Payer: Self-pay | Admitting: Cardiovascular Disease

## 2016-07-15 VITALS — BP 122/70 | HR 70 | Ht 60.0 in | Wt 167.4 lb

## 2016-07-15 DIAGNOSIS — I2510 Atherosclerotic heart disease of native coronary artery without angina pectoris: Secondary | ICD-10-CM | POA: Diagnosis not present

## 2016-07-15 DIAGNOSIS — I480 Paroxysmal atrial fibrillation: Secondary | ICD-10-CM

## 2016-07-15 DIAGNOSIS — R600 Localized edema: Secondary | ICD-10-CM

## 2016-07-15 DIAGNOSIS — I10 Essential (primary) hypertension: Secondary | ICD-10-CM | POA: Diagnosis not present

## 2016-07-15 DIAGNOSIS — I25810 Atherosclerosis of coronary artery bypass graft(s) without angina pectoris: Secondary | ICD-10-CM | POA: Diagnosis not present

## 2016-07-15 DIAGNOSIS — Z950 Presence of cardiac pacemaker: Secondary | ICD-10-CM

## 2016-07-15 LAB — BASIC METABOLIC PANEL
BUN: 21 mg/dL (ref 7–25)
CHLORIDE: 103 mmol/L (ref 98–110)
CO2: 26 mmol/L (ref 20–31)
CREATININE: 1.54 mg/dL — AB (ref 0.70–1.11)
Calcium: 8.8 mg/dL (ref 8.6–10.3)
GLUCOSE: 89 mg/dL (ref 65–99)
Potassium: 4.1 mmol/L (ref 3.5–5.3)
Sodium: 140 mmol/L (ref 135–146)

## 2016-07-15 LAB — TSH: TSH: 8.29 m[IU]/L — ABNORMAL HIGH (ref 0.40–4.50)

## 2016-07-15 MED ORDER — AMLODIPINE BESYLATE 2.5 MG PO TABS: 2.5000 mg | ORAL_TABLET | Freq: Every day | ORAL | 11 refills | Status: DC

## 2016-07-15 NOTE — Patient Instructions (Signed)
Your physician has recommended you make the following change in your medication:   1.) the furosemide has been increased to 20 mg daily.  2.) the amlodipine has been decreased to 2.5 mg daily. A new prescription has been sent to your  Pharmacy to reflect this change.  Your physician recommends that you return for lab work TODAY.  Your physician recommends that you schedule a follow-up appointment in: 4 months.

## 2016-07-15 NOTE — Progress Notes (Signed)
Patient ID: John Peck, male   DOB: 09/26/1924, 82 y.o.   MRN: [id number]      HPI: [name] is a 82 y.o. male who presents to the office today for a 4 month follow-up cardiology evaluation.  Mr. Huhn has  CAD and underwent CABG surgery with LIMA to the LAD, a vein to the circumflex in 1989.  In 2004 he underwent stenting to his RCA. Cardiac catheterization in January 2013 showed an ejection fraction of 50% with mild inferior, distal inferior and apical lateral hypocontractility. He had significant native CAD with 80% stenosis of the LAD after the second diagonal vessel and septal perforator artery with total occlusion of the mid LAD. He was 70 - 70% ostial stenosis in the circumflex vessel followed by 40% proximal stenosis and 7080% distal stenosis. He had an occluded Y graft which previously sequentially supplied the intermediate and distal circumflex.There was a patent stented proximal RCA with 90% stenosis in the anterior RV marginal branch arising from the mid RCA and had 70-80% stenosis in the distal RCA beyond the crux with 78% stenosis the PDA takeoff. His LIMA to LAD was patent. He has been on medical therapy. He has a history of atrial fibrillation, sick sinus syndrome and status post permanent pacemaker in 2009 which is a St. Jude device. Additional problems include iron deficiency anemia, hypertension, BPH, status post TURP in 1992, remote history of liver abscess, history of COPD.  In March 2015 due to recurrent increasing symptoms of chest pain he underwent repeat cardiac catheterization which revealed mild LV dysfunction with an EF of 45% inferior wall hypocontractility. There was significant multivessel native coronary obstructive disease with 50% ostial LAD stenosis followed by 80% proximal stenosis in the region of the first diagonal takeoff followed by 80% stenosis after the second diagonal vessel; total occlusion of the ramus intermediate vessel with evidence for  bidirectional retrograde collateralization; 40% ostial left circumflex stenosis; and widely patent proximal RCA stent with 80% stenosis in the anterior RV marginal branch, 40% mid or CVA stenosis, 50% distal, and 50% ostial and mid PDA stenoses. He had a patent LIMA graft supplying the mid LAD and an occluded Y graft which previously supplied the intermediate and distal circumflex.  He has been on increased medical regimen consisting of aspirin, Plavix, carvedilol 25 mg twice a day, high dose oral nitrate therapy, Ranexa 1000 g twice a day.  Amlodipine 10 mg in addition to Crestor 20 mg.  He also is taking Lasix 20 mg with supplemental potassium.  He was seen in the office by Lesia Hausen and admitted to bilateral lower extremity pain and weakness.  His blood pressure was somewhat low and his amlodipine dose was reduced from 10 to 5 mg. Lower extremity Doppler studies showed an ABI of 0.5 on the right and 0.58 on the left.  He had mild dilatation of his distal abdominal aorta.  There was equal to less than 50% diameter reduction.  The right SFA and right popliteal artery and there was a greater than 60% diameter reduction in the left SFA.  However, there was one vessel runoff via the peroneal artery bilaterally.  His symptoms are worse in the right lower extremity due to his distal disease.  He has  had done well with his increased medical regimen including Ranexa, high-dose nitrate therapy, amlodipine and carvedilol.  A follow-up Doppler study in August 2015 showed ABI of 0.5 on the right and 0.58 on the left.  He has  poor distal runoff.  His Doppler studies were reviewed with Dr. Gwenlyn Found who agreed with medical therapy.  He already was on aspirin and Plavix  He was hospitalized in January 2017 with chest pain and shortness of breath.  He without for an MI.  He was treated for COPD exacerbation.  He underwent repeat cardiac catheterization by Dr. Julianne Handler.  He was found to have an occluded mid LAD with  presumptive filling of the distal LAD by the LIMA graft, although during this catheterization.  He was unable to selectively engage the LIMA graft.  He had mild proximal disease in the circumflex vessel.  There was a widely patent stent in the RCA, but he developed a new distal RCA stenosis for which he underwent successful intervention.  He also had 90%.  RV marginal stenosis esters [place] still notes rare to occasional episodes of chest pain which do improve following sl nitroglycerin administration.  He has had some mild blood pressure lability.   When I last saw him, with his episodes of rare to occasional chest pain and blood pressure elevation.  I reinitiated amlodipine at 5 mg.  He has continued to take for nose.  He 1000 mg twice a day, isosorbide 120 mrem at morning and 60 mg at night, carvedilol 25 mg twice a day and furosemide 20 mg daily.  His furosemide dose had been reduced due to chronic kidney disease with a creatinine 1.66.  Recently, he has only been taking the Lasix 3 times per week.  The past several months he denies recurrent chest pain.  He does note bilateral feet and ankle swelling.  He denies palpitations.  He denies PND or orthopnea.  He presents for reevaluation.  Past Medical History:  Diagnosis Date  . Anemia   . CAD (coronary artery disease)    a. CABG '89; b. PCI '04; c. 11/2015 Cath/PCI: LM 20ost, LAD 136m LCX 30p, OM2 40, RCA 30p, 10p ISR, 90d (3.0x12 Synergy DES), AM 90, VG->OM2 known to be 100, LIMA->LAD not injected, patent in 2015.  .Marland KitchenChronic combined systolic and diastolic CHF (congestive heart failure) ([place]   . CKD (chronic kidney disease), stage III   . H/O: GI bleed    REMOTE HISTORY  . History of COPD   . Hyperlipidemia   . Hypertensive heart disease   . LBBB (left bundle branch block)   . Pacemaker Sept 2009   St Jude  . PAF (paroxysmal atrial fibrillation) (HCC)    a. not on anticoag due to prior GIB.  . Sick sinus syndrome ([facility] Sept 2009   ST  Jude PTVDP    Past Surgical History:  Procedure Laterality Date  . CARDIAC CATHETERIZATION  11/2011   EF 50%; significant native CAD w/80% stenosis of the LAD after 2nd giagonal vessel and septal perforating artery w/total occlusion of the mid left anterior descendiung.; 60-70% ostiasl stenosis in the circumflex vessel followed by 40% proximal stenosis and 70-80% distal circumflex stenosis;   . CARDIAC CATHETERIZATION N/A 11/29/2015   Procedure: Left Heart Cath and Cors/Grafts Angiography;  Surgeon: CBurnell Blanks MD;  [facility];  Service: Cardiovascular;  Laterality: N/A;  . CARDIAC CATHETERIZATION N/A 11/29/2015   Procedure: Coronary Stent Intervention;  Surgeon: CBurnell Blanks MD;  [facility];  Service: Cardiovascular;  Laterality: N/A;  . CATARACT EXTRACTION  2004  . CORONARY ANGIOPLASTY WITH STENT PLACEMENT  2004   RCA  . [place]  had LIMA to his LAD, a vein to the circumflex. In 2004 underwent stenting to his right coronary artery.  Marland Kitchen HEMORRHOID SURGERY    . INSERT / REPLACE / REMOVE PACEMAKER  07/17/08   DUAL-CHAMBER; PPM-ST.JUDE MEDNET  . LEFT HEART CATHETERIZATION WITH CORONARY/GRAFT ANGIOGRAM N/A 12/10/2011   Procedure: LEFT HEART CATHETERIZATION WITH Beatrix Fetters;  Surgeon: Troy Sine, MD;  Location: [facility] CATH LAB;  Service: Cardiovascular;  Laterality: N/A;  . LEFT HEART CATHETERIZATION WITH CORONARY/GRAFT ANGIOGRAM N/A 01/26/2014   Procedure: LEFT HEART CATHETERIZATION WITH Beatrix Fetters;  Surgeon: Troy Sine, MD;  Location: [facility] C[facility] LAB;  Service: Cardiovascular;  Laterality: N/A;  . PROSTATECTOMY      No Known Allergies  Current Outpatient Prescriptions  Medication Sig Dispense Refill  . aspirin 81 MG tablet Take 81 mg by mouth daily.     Marland Kitchen atorvastatin (LIPITOR) 40 MG tablet Take 1 tablet (40 mg total) by mouth daily. 30 tablet 6  . carvedilol (COREG) 25 MG tablet  Take 25 mg by mouth 2 (two) times daily with a meal.      . clopidogrel (PLAVIX) 75 MG tablet Take 75 mg by mouth daily at 3 pm.     . ferrous sulfate 325 (65 FE) MG tablet Take 325 mg by mouth 3 (three) times daily with meals.     . furosemide (LASIX) 20 MG tablet Take 20 mg by mouth daily.    . isosorbide mononitrate (IMDUR) 60 MG 24 hr tablet TAKE 2 TABLETS BY MOUTH EVERY MORNING AND 1 TABLET IN THE EVENING (Patient taking differently: Take 30-60 mg by mouth 2 (two) times daily. TAKE 1 tablet in the morning Take 0.5 tablet in the evening) 270 tablet 1  . latanoprost (XALATAN) 0.005 % ophthalmic solution Place 1 drop into both eyes daily.  4  . lubiprostone (AMITIZA) 24 MCG capsule Take 24 mcg by mouth daily as needed. For bowel movement    . Menthol, Topical Analgesic, (ICY HOT EX) Apply 1 application topically daily as needed (aches and pains).    . nitroGLYCERIN (NITROSTAT) 0.4 MG SL tablet PLACE 1 TABLET UNDER THE TONGUE AS NEEDED. 25 tablet 3  . pantoprazole (PROTONIX) 40 MG tablet Take 40 mg by mouth 2 (two) times daily.    . ranolazine (RANEXA) 1000 MG SR tablet Take 1 tablet (1,000 mg total) by mouth 2 (two) times daily. 180 tablet 1  . tamsulosin (FLOMAX) 0.4 MG CAPS capsule Take 0.4 mg by mouth daily.  2  . amLODipine (NORVASC) 2.5 MG tablet Take 1 tablet (2.5 mg total) by mouth daily. 30 tablet 11   No current facility-administered medications for this visit.     Social History   Social History  . Marital status: Married    Spouse name: N/A  . Number of children: 5  . Years of education: N/A   Occupational History  . RETIRED Retired    Banker DRIVER   Social History Main Topics  . Smoking status: Former Smoker    Packs/day: 2.00    Years: 65.00    Types: Cigarettes  . Smokeless tobacco: Former Systems developer    Quit date: 10/29/2008  . Alcohol use No  . Drug use: No  . Sexual activity: Not on file   Other Topics Concern  . Not on file   Social History Narrative  . No  narrative on file    Family History  Problem Relation Age of Onset  . Diabetes Father   .  Heart disease Sister   . Heart disease Brother   . Heart attack Brother   . Hypertension Neg Hx   . Stroke Brother     ROS General: Negative; No fevers, chills, or night sweats;  HEENT: Negative; No changes in vision or hearing, sinus congestion, difficulty swallowing Pulmonary: Negative; No cough, wheezing, shortness of breath, hemoptysis Cardiovascular: See history of present illness  Positive for claudication, right lower extremity greater than left. GI: Negative; No nausea, vomiting, diarrhea, or abdominal pain GU: Negative; No dysuria, hematuria, or difficulty voiding Musculoskeletal: Negative; no myalgias, joint pain, or weakness Hematologic/Oncology: Negative; no easy bruising, bleeding Endocrine: Negative; no heat/cold intolerance; no diabetes Neuro: Negative; no changes in balance, headaches Skin: Negative; No rashes or skin lesions Psychiatric: Negative; No behavioral problems, depression Sleep: Negative; No snoring, daytime sleepiness, hypersomnolence, bruxism, restless legs, hypnogognic hallucinations, no cataplexy Other comprehensive 14 point system review is negative.   PE BP 122/70   Pulse 70   Ht 5' (1.524 m)   Wt 167 lb 6.4 oz (75.9 kg)   BMI 32.69 kg/m    Repeat blood pressure when taken by me was 120/72.  Wt Readings from Last 3 Encounters:  07/15/16 167 lb 6.4 oz (75.9 kg)  03/19/16 170 lb 9.6 oz (77.4 kg)  03/17/16 168 lb 14.4 oz (76.6 kg)   General: Alert, oriented, no distress.  Skin: normal turgor, no rashes HEENT: Normocephalic, atraumatic. Pupils round and reactive; sclera anicteric;no lid lag.  Nose without nasal septal hypertrophy Mouth/Parynx benign; Mallinpatti scale 3 Neck: No JVD, no carotid bruits; normal carotid upstroke Lungs: clear to ausculatation and percussion; no wheezing or rales Chest wall: Nontender to palpation Heart: RRR, s1 s2  normal 1/6 systolic murmur; no diastolic murmur.  No S3 gallop.  No rubs, thrills or heaves.. Abdomen: Mild central adiposity ;soft, nontender; no hepatosplenomehaly, BS+; abdominal aorta nontender and not dilated by palpation. Back: No CVA tenderness Pulses 2+ upper extremity.  Bilateral femoral bruits.  Decreased pulses distally in the dorsalis pedis. Extremities: 1+ pedal edema bilaterally with ankle edema, right greater than left. no  clubbing cyanosis, Homan's sign negative  Neurologic: grossly nonfocal Psychologic: normal affect and mood.  ECG (independently read by me): AV paced rhythm at 70 bpm.  PR interval 206 ms.  ECG (independently read by me): AV paced rhythm at 60 bpm.  PR interval 202 ms.  October 2016 ECG (independently read by me): AV dual paced rhythm with 100% capture.  Ventricular rate 60 bpm.  PR interval 206 ms.  April 2016 ECG  (Independently read by me): Ventricular paced rhythm at 62 bpm; unchanged  October 2015 ECG  (Independently read by me): Ventricular paced rhythm.  Unchanged from prior ECG.  LABS:  BMP Latest Ref Rng & Units 04/09/2016 11/30/2015 11/29/2015  Glucose 65 - 99 mg/dL 93 133(H) 141(H)  BUN 7 - 25 mg/dL 26(H) 24(H) 20  Creatinine 0.70 - 1.11 mg/dL 1.66(H) 1.41(H) 1.25(H)  Sodium 135 - 146 mmol/L 136 138 138  Potassium 3.5 - 5.3 mmol/L 5.1 3.6 3.8  Chloride 98 - 110 mmol/L 102 104 101  CO2 20 - 31 mmol/L '27 25 26  '$ Calcium 8.6 - 10.3 mg/dL 9.1 8.3(L) 8.7(L)    Hepatic Function Latest Ref Rng & Units 04/09/2016 11/30/2015 11/01/2015  Total Protein 6.1 - 8.1 g/dL 6.9 5.9(L) 6.5  Albumin 3.6 - 5.1 g/dL 4.0 3.0(L) 3.6  AST 10 - 35 U/L '11 15 10  '$ ALT 9 - 46 U/L 7(L) 20  7(L)  Alk Phosphatase 40 - 115 U/L 92 65 83  Total Bilirubin 0.2 - 1.2 mg/dL 0.8 0.8 0.7  Bilirubin, Direct 0.1 - 0.5 mg/dL - - -     CBC Latest Ref Rng & Units 04/09/2016 03/17/2016 12/19/2015  WBC 3.8 - 10.8 K/uL 3.4(L) 3.8(L) 3.4(L)  Hemoglobin 13.2 - 17.1 g/dL 12.1(L) 11.9(L) 10.5(L)   Hematocrit 38.5 - 50.0 % 36.7(L) 36.3(L) 31.9(L)  Platelets 140 - 400 K/uL 147 119(L) 114(L)    Lab Results  Component Value Date   TSH 6.64 (H) 04/09/2016      BNP    Component Value Date/Time   PROBNP 304.0 (H) 11/12/2008 1154    Lipid Panel     Component Value Date/Time   CHOL 180 04/09/2016 0804   TRIG 96 04/09/2016 0804   HDL 60 04/09/2016 0804   CHOLHDL 3.0 04/09/2016 0804   VLDL 19 04/09/2016 0804   LDLCALC 101 04/09/2016 0804     RADIOLOGY: No results found.    ASSESSMENT AND PLAN: Mr. Coffield is an 82 year old gentleman who underwent initial bypass surgery in 1989 and stenting to his RCA in 2004.  He had been fairly stable on a medical regimen for his CAD and had developed recurrent chest pain symptomatology in the setting of possible COPD exacerbation in January 2017.  Repeat cardiac catheterization revealed progression of his distal RCA which was successfully stented.  He continues to be on medical therapy for his CAD and has documented occluded graft which had supplied his circumflex.  He has been on amlodipine, which was started for elevated blood pressure.  His blood pressure today is now normal, but he has experienced bilateral pedal and ankle edema, right leg greater than left.  I have suggested that he try reducing the amlodipine 2.5 mg daily.  He has only been taking his Lasix 3 days per week and I have suggested 20 mg daily. TSH 3 months ago was mildly increased at 6.64.  A repeat BMET and TSH will be obtained to reassess his renal function and follow-up thyroid studies.   He is on dual antiplatelet therapy with aspirin and Plavix.  He is on atorvastatin for hyperlipidemia with target LDL less than 70.  With his age of 82 years old, I will continue him on his present dose of atorvastatin 40 mg and suggested improved diet.    I will see him in 4 months for reevaluation.  Time spent: 25 minutes  Troy Sine, MD, [facility]  07/15/2016 6:33 PM

## 2016-07-17 ENCOUNTER — Telehealth: Payer: Self-pay | Admitting: Oncology

## 2016-07-17 ENCOUNTER — Other Ambulatory Visit (HOSPITAL_BASED_OUTPATIENT_CLINIC_OR_DEPARTMENT_OTHER): Payer: Medicare HMO

## 2016-07-17 ENCOUNTER — Ambulatory Visit (HOSPITAL_BASED_OUTPATIENT_CLINIC_OR_DEPARTMENT_OTHER): Payer: Medicare HMO | Admitting: Oncology

## 2016-07-17 VITALS — BP 117/54 | HR 65 | Temp 98.2°F | Resp 18 | Ht 60.0 in | Wt 168.0 lb

## 2016-07-17 DIAGNOSIS — J449 Chronic obstructive pulmonary disease, unspecified: Secondary | ICD-10-CM

## 2016-07-17 DIAGNOSIS — I10 Essential (primary) hypertension: Secondary | ICD-10-CM

## 2016-07-17 DIAGNOSIS — D696 Thrombocytopenia, unspecified: Secondary | ICD-10-CM | POA: Diagnosis not present

## 2016-07-17 DIAGNOSIS — N289 Disorder of kidney and ureter, unspecified: Secondary | ICD-10-CM | POA: Diagnosis not present

## 2016-07-17 DIAGNOSIS — D72819 Decreased white blood cell count, unspecified: Secondary | ICD-10-CM | POA: Diagnosis not present

## 2016-07-17 DIAGNOSIS — D61818 Other pancytopenia: Secondary | ICD-10-CM

## 2016-07-17 DIAGNOSIS — E785 Hyperlipidemia, unspecified: Secondary | ICD-10-CM

## 2016-07-17 DIAGNOSIS — Z862 Personal history of diseases of the blood and blood-forming organs and certain disorders involving the immune mechanism: Secondary | ICD-10-CM

## 2016-07-17 LAB — CBC WITH DIFFERENTIAL/PLATELET
BASO%: 0.3 % (ref 0.0–2.0)
BASOS ABS: 0 10*3/uL (ref 0.0–0.1)
EOS ABS: 0.2 10*3/uL (ref 0.0–0.5)
EOS%: 6.4 % (ref 0.0–7.0)
HEMATOCRIT: 30.3 % — AB (ref 38.4–49.9)
HEMOGLOBIN: 10.2 g/dL — AB (ref 13.0–17.1)
LYMPH#: 0.8 10*3/uL — AB (ref 0.9–3.3)
LYMPH%: 25.7 % (ref 14.0–49.0)
MCH: 32.5 pg (ref 27.2–33.4)
MCHC: 33.7 g/dL (ref 32.0–36.0)
MCV: 96.5 fL (ref 79.3–98.0)
MONO#: 0.5 10*3/uL (ref 0.1–0.9)
MONO%: 16.2 % — ABNORMAL HIGH (ref 0.0–14.0)
NEUT#: 1.7 10*3/uL (ref 1.5–6.5)
NEUT%: 51.4 % (ref 39.0–75.0)
PLATELETS: 107 10*3/uL — AB (ref 140–400)
RBC: 3.14 10*6/uL — ABNORMAL LOW (ref 4.20–5.82)
RDW: 14.1 % (ref 11.0–14.6)
WBC: 3.3 10*3/uL — ABNORMAL LOW (ref 4.0–10.3)

## 2016-07-17 NOTE — Progress Notes (Signed)
  [place] OFFICE PROGRESS NOTE   Diagnosis: Anemia  INTERVAL HISTORY:   Mr. Matsuo returns as scheduled. He complains of ankle swelling. He has arthritis discomfort in his knees and hips. No bleeding. He is taking iron.  Objective:  Vital signs in last 24 hours:  Blood pressure (!) 117/54, pulse 65, temperature 98.2 F (36.8 C), temperature source Oral, resp. rate 18, height 5' (1.524 m), weight 168 lb (76.2 kg), SpO2 98 %.    HEENT: Neck without mass Lymphatics: No cervical, supra-clavicular, axillary, or inguinal nodes. Prominent left axillary fat pad. Resp: Lungs clear bilaterally Cardio: Regular rate and rhythm GI: No hepatosplenomegaly Vascular: Trace ankle edema bilaterally  Skin: Sebaceous cyst at the left face and right retroauricular area    Lab Results:  Lab Results  Component Value Date   WBC 3.3 (L) 07/17/2016   HGB 10.2 (L) 07/17/2016   HCT 30.3 (L) 07/17/2016   MCV 96.5 07/17/2016   PLT 107 (L) 07/17/2016   NEUTROABS 1.7 07/17/2016     Medications: I have reviewed the patient's current medications.  Assessment/Plan: 1. History of iron deficiency anemia dating to 2007, stool Hemoccult positive in 2012, negative upper and lower endoscopy by Dr. Michail Sermon in 2012  2. History of coronary disease, status post coronary artery bypass surgery, PTCA/stent January 2017  3. Pacemaker placement in 1989  4. Hypertension  5. Dyslipidemia  6. BPH, status post TURP in 1992  7. History of a liver abscess  8. COPD  9. Thrombocytopenia/Leukopenia  10. Renal insufficiency    Disposition:  Mr. Walrath has mild pancytopenia. Review of the chart reveals low-grade pancytopenia 4 years. I suspect he has myelodysplasia or chronic liver disease. I discussed a bone marrow biopsy with Mr.Candelaria and his family. We decided to hold on a bone marrow biopsy for now given the chronicity of the hematologic findings and his lack of  symptoms.  He will contact us for bleeding. He will return for an office visit and CBC in 4 months.   Betsy Coder, MD  07/17/2016  10:42 AM

## 2016-07-17 NOTE — Telephone Encounter (Signed)
Avs report and appt schd given per 07/17/16 los. °

## 2016-07-29 ENCOUNTER — Telehealth: Payer: Self-pay | Admitting: Cardiovascular Disease

## 2016-07-29 ENCOUNTER — Telehealth: Payer: Self-pay | Admitting: *Deleted

## 2016-07-29 NOTE — Telephone Encounter (Signed)
-----   Message from Troy Sine, MD sent at 07/27/2016 10:23 AM EDT ----- Cr slightly improved; TSH elevated; add synthroid 25 ug and recheck TSH in 4 weeks.

## 2016-07-29 NOTE — Telephone Encounter (Signed)
Returning your call from this morning. °

## 2016-07-29 NOTE — Telephone Encounter (Signed)
Spoke with patient to discuss recent lab results and recommendations. He asked that I talk to his wife. He does not hear well nor does he understand what I say to him. He requests for his wife to return a call to me when she returns home. He will have her to call me back.

## 2016-07-30 ENCOUNTER — Other Ambulatory Visit: Payer: Self-pay | Admitting: *Deleted

## 2016-07-30 DIAGNOSIS — E039 Hypothyroidism, unspecified: Secondary | ICD-10-CM

## 2016-07-30 MED ORDER — LEVOTHYROXINE SODIUM 125 MCG PO TABS: 125.0000 ug | ORAL_TABLET | Freq: Every day | ORAL | 3 refills | Status: DC

## 2016-07-30 NOTE — Telephone Encounter (Signed)
Returned a call to patient's wife to discuss lab results and recommendations. She verbally voiced understanding. Synthroid RX and follow up TSH ordered.

## 2016-07-30 NOTE — Telephone Encounter (Signed)
Generic synthroid 0.125 mg sent into [facility] pharmacy per order request by Dr Claiborne Billings.

## 2016-07-30 NOTE — Patient Instructions (Signed)
Your physician recommends that you return for lab work in: 4 weeks after the initiation of the levothyroxine sent in to pharmacy.

## 2016-08-25 LAB — TSH: TSH: 0.87 m[IU]/L (ref 0.40–4.50)

## 2016-08-26 ENCOUNTER — Ambulatory Visit
Admission: RE | Admit: 2016-08-26 | Discharge: 2016-08-26 | Disposition: A | Discharge status: Home / Self Care | Payer: Medicare HMO | Source: Ambulatory Visit | Attending: Cardiology | Admitting: Cardiology

## 2016-08-26 ENCOUNTER — Other Ambulatory Visit: Payer: Self-pay | Admitting: Cardiology

## 2016-08-26 DIAGNOSIS — R069 Unspecified abnormalities of breathing: Secondary | ICD-10-CM

## 2016-08-28 ENCOUNTER — Emergency Department (HOSPITAL_COMMUNITY)
Admission: EM | Admit: 2016-08-28 | Discharge: 2016-08-28 | Disposition: A | Discharge status: Home / Self Care | Payer: Medicare HMO | Attending: Emergency Medicine | Admitting: Emergency Medicine

## 2016-08-28 ENCOUNTER — Encounter (HOSPITAL_COMMUNITY): Payer: Self-pay

## 2016-08-28 ENCOUNTER — Emergency Department (HOSPITAL_COMMUNITY): Payer: Medicare HMO

## 2016-08-28 DIAGNOSIS — Z87891 Personal history of nicotine dependence: Secondary | ICD-10-CM | POA: Insufficient documentation

## 2016-08-28 DIAGNOSIS — I2510 Atherosclerotic heart disease of native coronary artery without angina pectoris: Secondary | ICD-10-CM | POA: Diagnosis not present

## 2016-08-28 DIAGNOSIS — N183 Chronic kidney disease, stage 3 (moderate): Secondary | ICD-10-CM | POA: Diagnosis not present

## 2016-08-28 DIAGNOSIS — J441 Chronic obstructive pulmonary disease with (acute) exacerbation: Secondary | ICD-10-CM

## 2016-08-28 DIAGNOSIS — Z951 Presence of aortocoronary bypass graft: Secondary | ICD-10-CM | POA: Diagnosis not present

## 2016-08-28 DIAGNOSIS — I5042 Chronic combined systolic (congestive) and diastolic (congestive) heart failure: Secondary | ICD-10-CM | POA: Diagnosis not present

## 2016-08-28 DIAGNOSIS — Z950 Presence of cardiac pacemaker: Secondary | ICD-10-CM | POA: Diagnosis not present

## 2016-08-28 DIAGNOSIS — Z955 Presence of coronary angioplasty implant and graft: Secondary | ICD-10-CM | POA: Insufficient documentation

## 2016-08-28 DIAGNOSIS — Z7982 Long term (current) use of aspirin: Secondary | ICD-10-CM | POA: Insufficient documentation

## 2016-08-28 DIAGNOSIS — R0602 Shortness of breath: Secondary | ICD-10-CM | POA: Diagnosis present

## 2016-08-28 DIAGNOSIS — I130 Hypertensive heart and chronic kidney disease with heart failure and stage 1 through stage 4 chronic kidney disease, or unspecified chronic kidney disease: Secondary | ICD-10-CM | POA: Insufficient documentation

## 2016-08-28 DIAGNOSIS — J069 Acute upper respiratory infection, unspecified: Secondary | ICD-10-CM

## 2016-08-28 LAB — CBC WITH DIFFERENTIAL/PLATELET
Basophils Absolute: 0 10*3/uL (ref 0.0–0.1)
Basophils Relative: 0 %
EOS ABS: 0.5 10*3/uL (ref 0.0–0.7)
EOS PCT: 10 %
HCT: 34.5 % — ABNORMAL LOW (ref 39.0–52.0)
HEMOGLOBIN: 11.5 g/dL — AB (ref 13.0–17.0)
LYMPHS ABS: 1.4 10*3/uL (ref 0.7–4.0)
LYMPHS PCT: 27 %
MCH: 32.2 pg (ref 26.0–34.0)
MCHC: 33.3 g/dL (ref 30.0–36.0)
MCV: 96.6 fL (ref 78.0–100.0)
MONOS PCT: 11 %
Monocytes Absolute: 0.6 10*3/uL (ref 0.1–1.0)
NEUTROS PCT: 52 %
Neutro Abs: 2.7 10*3/uL (ref 1.7–7.7)
Platelets: 157 10*3/uL (ref 150–400)
RBC: 3.57 MIL/uL — AB (ref 4.22–5.81)
RDW: 13.9 % (ref 11.5–15.5)
WBC: 5.2 10*3/uL (ref 4.0–10.5)

## 2016-08-28 LAB — I-STAT TROPONIN, ED: Troponin i, poc: 0.01 ng/mL (ref 0.00–0.08)

## 2016-08-28 LAB — BASIC METABOLIC PANEL
Anion gap: 6 (ref 5–15)
BUN: 20 mg/dL (ref 6–20)
CHLORIDE: 108 mmol/L (ref 101–111)
CO2: 27 mmol/L (ref 22–32)
CREATININE: 1.28 mg/dL — AB (ref 0.61–1.24)
Calcium: 9.3 mg/dL (ref 8.9–10.3)
GFR calc Af Amer: 55 mL/min — ABNORMAL LOW (ref >60)
GFR calc non Af Amer: 48 mL/min — ABNORMAL LOW (ref >60)
Glucose, Bld: 105 mg/dL — ABNORMAL HIGH (ref 65–99)
POTASSIUM: 4.5 mmol/L (ref 3.5–5.1)
SODIUM: 141 mmol/L (ref 135–145)

## 2016-08-28 LAB — BRAIN NATRIURETIC PEPTIDE: B Natriuretic Peptide: 355.3 pg/mL — ABNORMAL HIGH (ref 0.0–100.0)

## 2016-08-28 MED ORDER — ALBUTEROL SULFATE HFA 108 (90 BASE) MCG/ACT IN AERS
2.0000 | INHALATION_SPRAY | RESPIRATORY_TRACT | Status: DC | PRN
  Administered 2016-08-28: 2 via RESPIRATORY_TRACT
  Filled 2016-08-28: qty 6.7

## 2016-08-28 MED ORDER — PREDNISONE 20 MG PO TABS: 40.0000 mg | ORAL_TABLET | Freq: Every day | ORAL | 0 refills | Status: DC

## 2016-08-28 MED ORDER — METHYLPREDNISOLONE SODIUM SUCC 125 MG IJ SOLR
125.0000 mg | Freq: Once | INTRAMUSCULAR | Status: AC
  Administered 2016-08-28: 125 mg via INTRAVENOUS
  Filled 2016-08-28: qty 2

## 2016-08-28 MED ORDER — IPRATROPIUM BROMIDE 0.02 % IN SOLN
RESPIRATORY_TRACT | Status: AC
  Administered 2016-08-28: 0.5 mg
  Filled 2016-08-28: qty 2.5

## 2016-08-28 MED ORDER — ALBUTEROL SULFATE (2.5 MG/3ML) 0.083% IN NEBU
2.5000 mg | INHALATION_SOLUTION | Freq: Once | RESPIRATORY_TRACT | Status: AC
  Administered 2016-08-28: 2.5 mg via RESPIRATORY_TRACT
  Filled 2016-08-28: qty 3

## 2016-08-28 MED ORDER — AEROCHAMBER PLUS FLO-VU MEDIUM MISC
1.0000 | Freq: Once | Status: AC
  Administered 2016-08-28: 1
  Filled 2016-08-28: qty 1

## 2016-08-28 MED ORDER — ALBUTEROL SULFATE (2.5 MG/3ML) 0.083% IN NEBU
INHALATION_SOLUTION | RESPIRATORY_TRACT | Status: AC
  Administered 2016-08-28: 5 mg
  Filled 2016-08-28: qty 6

## 2016-08-28 NOTE — ED Triage Notes (Signed)
Pt presents to the ed with increasing shortness of breath x 3 days, the patient is using accessory muscles with obvious wheezing and labored breathing, alert and oriented, denies any pain

## 2016-08-28 NOTE — Discharge Planning (Signed)
[facility] reviewed discharging chart for possible CM needs.  No needs identified.   Follow-up appointment needs? Pt to follow up with PCP ([place])  Transportation needs? Arrived to ED with family.  Medication assistance needs? Rx given; pt has insurance coverage.  Equipment needs? n/a                                      Oxygen needs? n/a  H.H needs? n/a  John Peck J. Clydene Laming, [place], [place], [place]

## 2016-08-28 NOTE — ED Provider Notes (Signed)
[facility] DEPT Provider Note   CSN: [id number] Arrival date & time: 08/28/16  1151     History   Chief Complaint Chief Complaint  Patient presents with  . Shortness of Breath    HPI John Peck is a 82 y.o. male.  82yo M w/ extensive PMH including CHF, CAD, CKD, COPD, A fib, pacemaker who p/w SOB. The patient has had 3 days of gradually worsening shortness of breath associated with a "chest cold" involving a dry cough and runny nose. He denies any associated fevers, vomiting, diarrhea, abdominal pain, or chest pain. He has not taken any medications including no inhalers to try to relieve his symptoms. Wife does note some mild lower extremity edema recently. He denies any worsening shortness of breath laying flat but does state that his shortness of breath is worse with exertion.   The history is provided by the patient.    Past Medical History:  Diagnosis Date  . Anemia   . CAD (coronary artery disease)    a. CABG '89; b. PCI '04; c. 11/2015 Cath/PCI: LM 20ost, LAD 129m, LCX 30p, OM2 40, RCA 30p, 10p ISR, 90d (3.0x12 Synergy DES), AM 90, VG->OM2 known to be 100, LIMA->LAD not injected, patent in 2015.  Marland Kitchen Chronic combined systolic and diastolic CHF (congestive heart failure) ([place])   . CKD (chronic kidney disease), stage III   . H/O: GI bleed    REMOTE HISTORY  . History of COPD   . Hyperlipidemia   . Hypertensive heart disease   . LBBB (left bundle branch block)   . Pacemaker Sept 2009   St Jude  . PAF (paroxysmal atrial fibrillation) (HCC)    a. not on anticoag due to prior GIB.  . Sick sinus syndrome [facility]) Sept 2009   ST Jude PTVDP    Patient Active Problem List   Diagnosis Date Noted  . CAD (coronary artery disease)   . Hypertensive heart disease   . CKD (chronic kidney disease), stage II   . Hyperlipidemia   . Stented coronary artery   . Coronary artery disease involving native coronary artery of native heart with unstable angina pectoris ([place])   .  Chest pain 11/27/2015  . Reactive airway disease 11/27/2015  . Angina pectoris ([place]) 11/27/2015  . CAD in native artery 09/03/2015  . Hyperkalemia 05/09/2015  . Weakness 05/09/2015  . Acute on chronic kidney failure ([place]) 05/09/2015  . Chronic diastolic CHF (congestive heart failure) ([place]) 05/09/2015  . Hyperlipidemia LDL goal <70 02/28/2015  . Atherosclerosis of lower extremity with claudication ([place]) 07/17/2014  . Unstable angina ([place]) 01/23/2014  . PAF (paroxysmal atrial fibrillation) ([place]) 03/24/2013  . Lower extremity edema 03/24/2013  . Sinoatrial node dysfunction ([place]) 08/17/2012  . History of iron deficiency anemia 09/18/2011  . Essential hypertension 07/07/2009  . INTERMEDIATE CORONARY SYNDROME 07/07/2009  . S/P CABG x 2 '89. RCA stent'04. last cath Jan 2013 07/07/2009  . PACEMAKER, PERMANENT- St Jude Sept 2009 07/07/2009  . Sick sinus syndrome ([place]) 07/10/2008    Past Surgical History:  Procedure Laterality Date  . CARDIAC CATHETERIZATION  11/2011   EF 50%; significant native CAD w/80% stenosis of the LAD after 2nd giagonal vessel and septal perforating artery w/total occlusion of the mid left anterior descendiung.; 60-70% ostiasl stenosis in the circumflex vessel followed by 40% proximal stenosis and 70-80% distal circumflex stenosis;   . CARDIAC CATHETERIZATION N/A 11/29/2015   Procedure: Left Heart Cath and Cors/Grafts Angiography;  Surgeon: Burnell Blanks, MD;  Location: [place] CV LAB;  Service: Cardiovascular;  Laterality: N/A;  . CARDIAC CATHETERIZATION N/A 11/29/2015   Procedure: Coronary Stent Intervention;  Surgeon: Burnell Blanks, MD;  Location: [place] CV LAB;  Service: Cardiovascular;  Laterality: N/A;  . CATARACT EXTRACTION  2004  . CORONARY ANGIOPLASTY WITH STENT PLACEMENT  2004   RCA  . CORONARY ARTERY BYPASS GRAFT  1989   had LIMA to his LAD, a vein to the circumflex. In 2004 underwent stenting to his right coronary artery.  Marland Kitchen  HEMORRHOID SURGERY    . INSERT / REPLACE / REMOVE PACEMAKER  07/17/08   DUAL-CHAMBER; PPM-ST.JUDE MEDNET  . LEFT HEART CATHETERIZATION WITH CORONARY/GRAFT ANGIOGRAM N/A 12/10/2011   Procedure: LEFT HEART CATHETERIZATION WITH Beatrix Fetters;  Surgeon: Troy Sine, MD;  Location: [facility] CATH LAB;  Service: Cardiovascular;  Laterality: N/A;  . LEFT HEART CATHETERIZATION WITH CORONARY/GRAFT ANGIOGRAM N/A 01/26/2014   Procedure: LEFT HEART CATHETERIZATION WITH Beatrix Fetters;  Surgeon: Troy Sine, MD;  Location: [facility] CATH LAB;  Service: Cardiovascular;  Laterality: N/A;  . PROSTATECTOMY         Home Medications    Prior to Admission medications   Medication Sig Start Date End Date Taking? Authorizing Provider  amLODipine (NORVASC) 2.5 MG tablet Take 1 tablet (2.5 mg total) by mouth daily. 07/15/16 10/13/16 Yes Troy Sine, MD  aspirin 81 MG tablet Take 81 mg by mouth daily.    Yes Historical Provider, MD  atorvastatin (LIPITOR) 40 MG tablet Take 1 tablet (40 mg total) by mouth daily. 09/18/15  Yes Troy Sine, MD  carvedilol (COREG) 25 MG tablet Take 25 mg by mouth 2 (two) times daily with a meal.     Yes Historical Provider, MD  clopidogrel (PLAVIX) 75 MG tablet Take 75 mg by mouth daily at 3 pm.    Yes Historical Provider, MD  ferrous sulfate 325 (65 FE) MG tablet Take 325 mg by mouth 3 (three) times daily with meals.    Yes Historical Provider, MD  furosemide (LASIX) 20 MG tablet Take 20 mg by mouth daily.   Yes Historical Provider, MD  isosorbide mononitrate (IMDUR) 60 MG 24 hr tablet TAKE 2 TABLETS BY MOUTH EVERY MORNING AND 1 TABLET IN THE EVENING Patient taking differently: Take 30-60 mg by mouth 2 (two) times daily.  09/16/15  Yes Troy Sine, MD  latanoprost (XALATAN) 0.005 % ophthalmic solution Place 1 drop into both eyes daily. 11/07/15  Yes Historical Provider, MD  levothyroxine (SYNTHROID, LEVOTHROID) 125 MCG tablet Take 1 tablet (125 mcg total) by mouth daily  before breakfast. 07/30/16  Yes Troy Sine, MD  lubiprostone (AMITIZA) 24 MCG capsule Take 24 mcg by mouth daily as needed. For bowel movement   Yes Historical Provider, MD  Menthol, Topical Analgesic, (ICY HOT EX) Apply 1 application topically daily as needed (aches and pains).   Yes Historical Provider, MD  pantoprazole (PROTONIX) 40 MG tablet Take 40 mg by mouth 2 (two) times daily.   Yes Historical Provider, MD  ranolazine (RANEXA) 1000 MG SR tablet Take 1 tablet (1,000 mg total) by mouth 2 (two) times daily. 11/27/15  Yes Troy Sine, MD  tamsulosin (FLOMAX) 0.4 MG CAPS capsule Take 0.4 mg by mouth daily. 02/26/15  Yes Historical Provider, MD  nitroGLYCERIN (NITROSTAT) 0.4 MG SL tablet PLACE 1 TABLET UNDER THE TONGUE AS NEEDED. 07/06/16   Troy Sine, MD  predniSONE (DELTASONE) 20 MG tablet Take 2 tablets (40 mg  total) by mouth daily. 08/28/16   Sharlett Iles, MD    Family History Family History  Problem Relation Age of Onset  . Diabetes Father   . Heart disease Sister   . Heart disease Brother   . Heart attack Brother   . Stroke Brother   . Hypertension Neg Hx     Social History Social History  Substance Use Topics  . Smoking status: Former Smoker    Packs/day: 2.00    Years: 65.00    Types: Cigarettes  . Smokeless tobacco: Former Systems developer    Quit date: 10/29/2008  . Alcohol use No     Allergies   Review of patient's allergies indicates no known allergies.   Review of Systems Review of Systems 10 Systems reviewed and are negative for acute change except as noted in the HPI.   Physical Exam Updated Vital Signs BP 170/79   Pulse 65   Temp 98 F (36.7 C)   Resp 18   Ht 5\' 6"  (1.676 m)   Wt 165 lb (74.8 kg)   SpO2 98%   BMI 26.63 kg/m   Physical Exam  Constitutional: He is oriented to person, place, and time. He appears well-developed and well-nourished. No distress.  HENT:  Head: Normocephalic and atraumatic.  Moist mucous membranes  Eyes:  Conjunctivae are normal. Pupils are equal, round, and reactive to light.  Neck: Neck supple.  Cardiovascular: Normal rate, regular rhythm and normal heart sounds.   No murmur heard. Pulmonary/Chest: No respiratory distress. He has wheezes.  Mildly increased WOB with expiratory wheezes, diminished BS, and prolonged expiratory phase b/l  Abdominal: Soft. Bowel sounds are normal. He exhibits no distension. There is no tenderness.  Musculoskeletal: He exhibits edema (mild BLE).  Neurological: He is alert and oriented to person, place, and time.  Fluent speech  Skin: Skin is warm and dry.  Psychiatric: He has a normal mood and affect. Judgment normal.  Nursing note and vitals reviewed.    ED Treatments / Results  Labs (all labs ordered are listed, but only abnormal results are displayed) Labs Reviewed  BRAIN NATRIURETIC PEPTIDE - Abnormal; Notable for the following:       Result Value   B Natriuretic Peptide 355.3 (*)    All other components within normal limits  BASIC METABOLIC PANEL - Abnormal; Notable for the following:    Glucose, Bld 105 (*)    Creatinine, Ser 1.28 (*)    GFR calc non Af Amer 48 (*)    GFR calc Af Amer 55 (*)    All other components within normal limits  CBC WITH DIFFERENTIAL/PLATELET - Abnormal; Notable for the following:    RBC 3.57 (*)    Hemoglobin 11.5 (*)    HCT 34.5 (*)    All other components within normal limits  I-STAT TROPOININ, ED    EKG  EKG Interpretation  Date/Time:  Friday August 28 2016 11:59:05 EDT Ventricular Rate:  114 PR Interval:    QRS Duration: 168 QT Interval:  466 QTC Calculation: 548 R Axis:   -31 Text Interpretation:  Ectopic atrial tachycardia, unifocal Paired ventricular premature complexes Left bundle branch block Baseline wander in lead(s) V1 V4 Baseline wander QRS wider than previous tracing Confirmed by Josejuan Hoaglin MD, Lolamae Voisin [phone]) on 08/28/2016 12:13:02 PM       Radiology Dg Chest Port 1 View  Result Date:  08/28/2016 CLINICAL DATA:  Shortness of breath. EXAM: PORTABLE CHEST 1 VIEW COMPARISON:  Radiographs of August 26, 2016. FINDINGS: Stable cardiomediastinal silhouette. Left-sided pacemaker is unchanged in position. Sternotomy wires are noted. No pneumothorax or significant pleural effusion is noted. No acute pulmonary disease is noted. Bony thorax is unremarkable. IMPRESSION: No acute cardiopulmonary abnormality seen. Electronically Signed   By: Marijo Conception, M.D.   On: 08/28/2016 12:41    Procedures Procedures (including critical care time)  Medications Ordered in ED Medications  albuterol (PROVENTIL HFA;VENTOLIN HFA) 108 (90 Base) MCG/ACT inhaler 2 puff (2 puffs Inhalation Given 08/28/16 1520)  albuterol (PROVENTIL) (2.5 MG/3ML) 0.083% nebulizer solution (5 mg  Given 08/28/16 1208)  ipratropium (ATROVENT) 0.02 % nebulizer solution (0.5 mg  Given 08/28/16 1208)  methylPREDNISolone sodium succinate (SOLU-MEDROL) 125 mg/2 mL injection 125 mg (125 mg Intravenous Given 08/28/16 1231)  albuterol (PROVENTIL) (2.5 MG/3ML) 0.083% nebulizer solution 2.5 mg (2.5 mg Nebulization Given 08/28/16 1226)  AEROCHAMBER PLUS FLO-VU MEDIUM MISC 1 each (1 each Other Given 08/28/16 1521)     Initial Impression / Assessment and Plan / ED Course  I have reviewed the triage vital signs and the nursing notes.  Pertinent labs & imaging results that were available during my care of the patient were reviewed by me and considered in my medical decision making (see chart for details).  Clinical Course    Pt w/ h/o including COPD and CHF p/w 3 days Of worsening shortness of breath in the setting of URI. He was in mild respiratory distress at presentation at 90% on room air, accessory muscle use but no respiratory distress. He was placed on DuoNeb and during my examination after being on DuoNeb, he was breathing more comfortably. He had wheezes bilaterally and diminished breath sounds. Mentating appropriately.  Obtained above labs as well as chest x-ray. EKG without acute ischemic changes. Gave the patient Solu-Medrol.  Labs show BNP 355, trop negative, other labs reassuring. CXR neg acute. On reexamination, the patient was sitting up eating a sandwich, well-appearing with much improved work of breathing. He stated that he felt much better. His wheezing had improved. His lab work and chest x-ray suggests COPD rather than CHF exacerbation. Based on his well appearance and no oxygen requirement, I feel he is safe for discharge with outpatient treatment. Provided with albuterol and prednisone to use at home. Instructed to follow-up with PCP in the next few days for reassessment. Extensively reviewed return precautions and patient discharged in satisfactory condition.  Final Clinical Impressions(s) / ED Diagnoses   Final diagnoses:  COPD exacerbation ([place])  Viral URI    New Prescriptions Discharge Medication List as of 08/28/2016  3:04 PM    START taking these medications   Details  predniSONE (DELTASONE) 20 MG tablet Take 2 tablets (40 mg total) by mouth daily., Starting Fri 08/28/2016, Print         Sharlett Iles, MD 08/28/16 [phone]

## 2016-08-28 NOTE — ED Notes (Signed)
Patient updated, food given to patient

## 2016-09-09 ENCOUNTER — Telehealth: Payer: Self-pay | Admitting: *Deleted

## 2016-09-09 NOTE — Telephone Encounter (Signed)
Patient's wife notified of lab results and recommendations.

## 2016-09-09 NOTE — Telephone Encounter (Signed)
-----   Message from Troy Sine, MD sent at 08/30/2016  4:34 PM EDT ----- tsh better

## 2016-10-05 ENCOUNTER — Encounter: Payer: Self-pay | Admitting: Internal Medicine

## 2016-10-12 ENCOUNTER — Ambulatory Visit (INDEPENDENT_AMBULATORY_CARE_PROVIDER_SITE_OTHER): Payer: Medicare HMO | Admitting: Internal Medicine

## 2016-10-12 ENCOUNTER — Encounter (INDEPENDENT_AMBULATORY_CARE_PROVIDER_SITE_OTHER): Payer: Self-pay

## 2016-10-12 ENCOUNTER — Encounter: Payer: Self-pay | Admitting: Internal Medicine

## 2016-10-12 VITALS — BP 142/76 | HR 60 | Ht 66.0 in | Wt 166.2 lb

## 2016-10-12 DIAGNOSIS — I480 Paroxysmal atrial fibrillation: Secondary | ICD-10-CM | POA: Diagnosis not present

## 2016-10-12 DIAGNOSIS — Z950 Presence of cardiac pacemaker: Secondary | ICD-10-CM

## 2016-10-12 LAB — CUP PACEART INCLINIC DEVICE CHECK
Battery Voltage: 2.74 V
Implantable Lead Implant Date: 20090908
Implantable Pulse Generator Implant Date: 20090908
Lead Channel Impedance Value: 374 Ohm
Lead Channel Pacing Threshold Pulse Width: 0.4 ms
Lead Channel Setting Pacing Amplitude: 2 V
Lead Channel Setting Pacing Pulse Width: 0.4 ms
Lead Channel Setting Sensing Sensitivity: 2 mV
MDC IDC LEAD IMPLANT DT: 20090908
MDC IDC LEAD LOCATION: 753859
MDC IDC LEAD LOCATION: 753860
MDC IDC MSMT BATTERY IMPEDANCE: 3500 Ohm
MDC IDC MSMT LEADCHNL RA PACING THRESHOLD AMPLITUDE: 0.5 V
MDC IDC MSMT LEADCHNL RA PACING THRESHOLD PULSEWIDTH: 0.4 ms
MDC IDC MSMT LEADCHNL RA SENSING INTR AMPL: 3 mV
MDC IDC MSMT LEADCHNL RV IMPEDANCE VALUE: 401 Ohm
MDC IDC MSMT LEADCHNL RV PACING THRESHOLD AMPLITUDE: 1.25 V
MDC IDC MSMT LEADCHNL RV SENSING INTR AMPL: 6.2 mV
MDC IDC PG SERIAL: 1266473
MDC IDC SESS DTM: 20171204151913
MDC IDC STAT BRADY RA PERCENT PACED: 92 %
MDC IDC STAT BRADY RV PERCENT PACED: 98 %

## 2016-10-12 NOTE — Progress Notes (Signed)
HPI John Peck returns today for followup. He is a very pleasant 82 year old man with a history of symptomatic bradycardia, status post permanent pacemaker insertion. He also is hypertension. He has done well in the interim. His wife notes that he is relatively inactive. He denies syncope or near-syncope. No peripheral edema. He has had trouble with sleeping. He sleeps during the day and then is up at night. He has had some weakness. He was in the hospital with sob almost a year ago and underwent stenting. Since then he has been stable. No Known Allergies   Current Outpatient Prescriptions  Medication Sig Dispense Refill  . amLODipine (NORVASC) 2.5 MG tablet Take 1 tablet (2.5 mg total) by mouth daily. 30 tablet 11  . aspirin 81 MG tablet Take 81 mg by mouth daily.     Marland Kitchen atorvastatin (LIPITOR) 40 MG tablet Take 1 tablet (40 mg total) by mouth daily. 30 tablet 6  . carvedilol (COREG) 25 MG tablet Take 25 mg by mouth 2 (two) times daily with a meal.      . clopidogrel (PLAVIX) 75 MG tablet Take 75 mg by mouth daily at 3 pm.     . ferrous sulfate 325 (65 FE) MG tablet Take 325 mg by mouth 3 (three) times daily with meals.     . furosemide (LASIX) 20 MG tablet Take 20 mg by mouth daily.    . isosorbide mononitrate (IMDUR) 60 MG 24 hr tablet TAKE 2 TABLETS BY MOUTH EVERY MORNING AND 1 TABLET IN THE EVENING (Patient taking differently: Take 30-60 mg by mouth 2 (two) times daily. ) 270 tablet 1  . latanoprost (XALATAN) 0.005 % ophthalmic solution Place 1 drop into both eyes daily.  4  . levothyroxine (SYNTHROID, LEVOTHROID) 125 MCG tablet Take 1 tablet (125 mcg total) by mouth daily before breakfast. 30 tablet 3  . lubiprostone (AMITIZA) 24 MCG capsule Take 24 mcg by mouth daily as needed. For bowel movement    . Menthol, Topical Analgesic, (ICY HOT EX) Apply 1 application topically daily as needed (aches and pains).    . nitroGLYCERIN (NITROSTAT) 0.4 MG SL tablet PLACE 1 TABLET UNDER THE TONGUE AS  NEEDED. 25 tablet 3  . pantoprazole (PROTONIX) 40 MG tablet Take 40 mg by mouth 2 (two) times daily.    . predniSONE (DELTASONE) 20 MG tablet Take 2 tablets (40 mg total) by mouth daily. 10 tablet 0  . ranolazine (RANEXA) 1000 MG SR tablet Take 1 tablet (1,000 mg total) by mouth 2 (two) times daily. 180 tablet 1  . tamsulosin (FLOMAX) 0.4 MG CAPS capsule Take 0.4 mg by mouth daily.  2   No current facility-administered medications for this visit.      Past Medical History:  Diagnosis Date  . Anemia   . CAD (coronary artery disease)    a. CABG '89; b. PCI '04; c. 11/2015 Cath/PCI: LM 20ost, LAD 136m, LCX 30p, OM2 40, RCA 30p, 10p ISR, 90d (3.0x12 Synergy DES), AM 90, VG->OM2 known to be 100, LIMA->LAD not injected, patent in 2015.  Marland Kitchen Chronic combined systolic and diastolic CHF (congestive heart failure) ([place])   . CKD (chronic kidney disease), stage III   . H/O: GI bleed    REMOTE HISTORY  . History of COPD   . Hyperlipidemia   . Hypertensive heart disease   . LBBB (left bundle branch block)   . Pacemaker Sept 2009   St Jude  . PAF (paroxysmal atrial fibrillation) ([place])  a. not on anticoag due to prior GIB.  . Sick sinus syndrome [facility]) Sept 2009   ST Jude PTVDP    ROS:   All systems reviewed and negative except as noted in the HPI.   Past Surgical History:  Procedure Laterality Date  . CARDIAC CATHETERIZATION  11/2011   EF 50%; significant native CAD w/80% stenosis of the LAD after 2nd giagonal vessel and septal perforating artery w/total occlusion of the mid left anterior descendiung.; 60-70% ostiasl stenosis in the circumflex vessel followed by 40% proximal stenosis and 70-80% distal circumflex stenosis;   . CARDIAC CATHETERIZATION N/A 11/29/2015   Procedure: Left Heart Cath and Cors/Grafts Angiography;  Surgeon: Burnell Blanks, MD;  Location: [place] CV LAB;  Service: Cardiovascular;  Laterality: N/A;  . CARDIAC CATHETERIZATION N/A 11/29/2015   Procedure:  Coronary Stent Intervention;  Surgeon: Burnell Blanks, MD;  Location: [place] CV LAB;  Service: Cardiovascular;  Laterality: N/A;  . CATARACT EXTRACTION  2004  . CORONARY ANGIOPLASTY WITH STENT PLACEMENT  2004   RCA  . CORONARY ARTERY BYPASS GRAFT  1989   had LIMA to his LAD, a vein to the circumflex. In 2004 underwent stenting to his right coronary artery.  Marland Kitchen HEMORRHOID SURGERY    . INSERT / REPLACE / REMOVE PACEMAKER  07/17/08   DUAL-CHAMBER; PPM-ST.JUDE MEDNET  . LEFT HEART CATHETERIZATION WITH CORONARY/GRAFT ANGIOGRAM N/A 12/10/2011   Procedure: LEFT HEART CATHETERIZATION WITH Beatrix Fetters;  Surgeon: Troy Sine, MD;  Location: [facility] CATH LAB;  Service: Cardiovascular;  Laterality: N/A;  . LEFT HEART CATHETERIZATION WITH CORONARY/GRAFT ANGIOGRAM N/A 01/26/2014   Procedure: LEFT HEART CATHETERIZATION WITH Beatrix Fetters;  Surgeon: Troy Sine, MD;  Location: [facility] CATH LAB;  Service: Cardiovascular;  Laterality: N/A;  . PROSTATECTOMY       Family History  Problem Relation Age of Onset  . Diabetes Father   . Heart disease Sister   . Heart disease Brother   . Heart attack Brother   . Stroke Brother   . Hypertension Neg Hx      Social History   Social History  . Marital status: Married    Spouse name: N/A  . Number of children: 5  . Years of education: N/A   Occupational History  . RETIRED Retired    Banker DRIVER   Social History Main Topics  . Smoking status: Former Smoker    Packs/day: 2.00    Years: 65.00    Types: Cigarettes  . Smokeless tobacco: Former Systems developer    Quit date: 10/29/2008  . Alcohol use No  . Drug use: No  . Sexual activity: Not on file   Other Topics Concern  . Not on file   Social History Narrative  . No narrative on file     BP (!) 142/76   Pulse 60   Ht 5\' 6"  (1.676 m)   Wt 166 lb 3.2 oz (75.4 kg)   SpO2 97%   BMI 26.83 kg/m   Physical Exam:  Well appearing 82 year old man, NAD HEENT:  Unremarkable Neck:  7 cm JVD, no thyromegally Lungs:  Clear with no wheezes, rales, or rhonchi. HEART:  Regular rate rhythm, no murmurs, no rubs, no clicks Abd:  soft, positive bowel sounds, no organomegally, no rebound, no guarding Ext:  2 plus pulses, no edema, no cyanosis, no clubbing Skin:  No rashes no nodules Neuro:  CN II through XII intact, motor grossly intact  DEVICE  Normal device function.  See  PaceArt for details.   Assess/Plan:  1. Complete heart block - he is asymptomatic, s/p PPM insertion 2. PPM - his St. Jude DDD PM is working normally. Will recheck in several months. 3. HTN - he blood pressure is up in our office. He notes that it is ok at home.  Mikle Bosworth.D.

## 2016-10-12 NOTE — Patient Instructions (Signed)
Medication Instructions:  Your physician recommends that you continue on your current medications as directed. Please refer to the Current Medication list given to you today.   Labwork: None Ordered   Testing/Procedures: None Ordered   Follow-Up: Your physician wants you to follow-up in: 6 months in the [facility] and 1 year with Dr. Lovena Le.  You will receive a reminder letter in the mail two months in advance. If you don't receive a letter, please call our office to schedule the follow-up appointment.   Any Other Special Instructions Will Be Listed Below (If Applicable).     If you need a refill on your cardiac medications before your next appointment, please call your pharmacy.

## 2016-10-13 ENCOUNTER — Telehealth: Payer: Self-pay | Admitting: Cardiovascular Disease

## 2016-10-13 NOTE — Telephone Encounter (Signed)
Spoke with pt wife letting her know we do not have any samples of Ranexa

## 2016-10-13 NOTE — Telephone Encounter (Signed)
Patient calling the office for samples of medication:   1.  What medication and dosage are you requesting samples for? Renexta 1000 mg  2.  Are you currently out of this medication? No 3 days left

## 2016-10-22 ENCOUNTER — Other Ambulatory Visit: Payer: Self-pay | Admitting: Internal Medicine

## 2016-11-17 DIAGNOSIS — I259 Chronic ischemic heart disease, unspecified: Secondary | ICD-10-CM | POA: Diagnosis not present

## 2016-11-20 ENCOUNTER — Ambulatory Visit: Payer: Medicare HMO | Admitting: Oncology

## 2016-11-20 ENCOUNTER — Other Ambulatory Visit: Payer: Medicare HMO

## 2016-11-28 ENCOUNTER — Telehealth: Payer: Self-pay | Admitting: Oncology

## 2016-11-28 NOTE — Telephone Encounter (Signed)
Spoke with wife re 2/2 lab/fu. Schedule mailed.

## 2016-11-29 ENCOUNTER — Other Ambulatory Visit: Payer: Self-pay | Admitting: Cardiovascular Disease

## 2016-11-30 NOTE — Telephone Encounter (Signed)
Rx(s) sent to pharmacy electronically.  

## 2016-12-11 ENCOUNTER — Other Ambulatory Visit: Payer: Medicare HMO

## 2016-12-11 ENCOUNTER — Ambulatory Visit: Payer: Medicare HMO | Admitting: Oncology

## 2016-12-25 ENCOUNTER — Telehealth: Payer: Self-pay | Admitting: Oncology

## 2016-12-25 ENCOUNTER — Ambulatory Visit (HOSPITAL_BASED_OUTPATIENT_CLINIC_OR_DEPARTMENT_OTHER): Payer: Medicare HMO | Admitting: Oncology

## 2016-12-25 ENCOUNTER — Other Ambulatory Visit (HOSPITAL_BASED_OUTPATIENT_CLINIC_OR_DEPARTMENT_OTHER): Payer: Medicare HMO

## 2016-12-25 VITALS — BP 137/56 | HR 64 | Temp 98.4°F | Resp 17 | Ht 66.0 in | Wt 166.8 lb

## 2016-12-25 DIAGNOSIS — Z862 Personal history of diseases of the blood and blood-forming organs and certain disorders involving the immune mechanism: Secondary | ICD-10-CM

## 2016-12-25 DIAGNOSIS — I10 Essential (primary) hypertension: Secondary | ICD-10-CM

## 2016-12-25 DIAGNOSIS — N289 Disorder of kidney and ureter, unspecified: Secondary | ICD-10-CM

## 2016-12-25 DIAGNOSIS — D509 Iron deficiency anemia, unspecified: Secondary | ICD-10-CM

## 2016-12-25 LAB — CBC WITH DIFFERENTIAL/PLATELET
BASO%: 0.6 % (ref 0.0–2.0)
BASOS ABS: 0 10*3/uL (ref 0.0–0.1)
EOS ABS: 0.3 10*3/uL (ref 0.0–0.5)
EOS%: 8.1 % — ABNORMAL HIGH (ref 0.0–7.0)
HEMATOCRIT: 32.2 % — AB (ref 38.4–49.9)
HEMOGLOBIN: 11 g/dL — AB (ref 13.0–17.1)
LYMPH#: 1 10*3/uL (ref 0.9–3.3)
LYMPH%: 25.4 % (ref 14.0–49.0)
MCH: 32.6 pg (ref 27.2–33.4)
MCHC: 34 g/dL (ref 32.0–36.0)
MCV: 96 fL (ref 79.3–98.0)
MONO#: 0.6 10*3/uL (ref 0.1–0.9)
MONO%: 13.4 % (ref 0.0–14.0)
NEUT#: 2.2 10*3/uL (ref 1.5–6.5)
NEUT%: 52.5 % (ref 39.0–75.0)
PLATELETS: 141 10*3/uL (ref 140–400)
RBC: 3.36 10*6/uL — ABNORMAL LOW (ref 4.20–5.82)
RDW: 13.9 % (ref 11.0–14.6)
WBC: 4.1 10*3/uL (ref 4.0–10.3)

## 2016-12-25 NOTE — Telephone Encounter (Signed)
Appointments scheduled per 12/25/16 los. Patient was given a copy of the AVS report and appointment schedule per 12/25/16 los. °

## 2016-12-25 NOTE — Progress Notes (Signed)
  [facility] OFFICE PROGRESS NOTE   Diagnosis: Anemia  INTERVAL HISTORY:   Mr. Murdick returns as scheduled. No specific complaint. No bleeding. No recent infection. He reports receiving an influenza vaccine.  Objective:  Vital signs in last 24 hours:  Blood pressure (!) 137/56, pulse 64, temperature 98.4 F (36.9 C), temperature source Oral, resp. rate 17, height 5\' 6"  (1.676 m), weight 166 lb 12.8 oz (75.7 kg), SpO2 98 %.    HEENT: Cyst at the right postauricular area Lymphatics: No cervical, supraclavicular, axillary, or inguinal nodes Resp: Scattered end inspiratory coarse rhonchi at the lower chest, no respiratory distress Cardio: Regular rate and rhythm GI: No hepatosplenomegaly Vascular: The right lower leg is slightly larger than the left side, no edema   Lab Results:  Lab Results  Component Value Date   WBC 4.1 12/25/2016   HGB 11.0 (L) 12/25/2016   HCT 32.2 (L) 12/25/2016   MCV 96.0 12/25/2016   PLT 141 12/25/2016   NEUTROABS 2.2 12/25/2016     Medications: I have reviewed the patient's current medications.  Assessment/Plan: 1. History of iron deficiency anemia dating to 2007, stool Hemoccult positive in 2012, negative upper and lower endoscopy by Dr. Michail Sermon in 2012  2. History of coronary disease, status post coronary artery bypass surgery, PTCA/stent January 2017  3. Pacemaker placement in 1989  4. Hypertension  5. Dyslipidemia  6. BPH, status post TURP in 1992  7. History of a liver abscess  8. COPD  9. History of Thrombocytopenia/Leukopenia  10. Renal insufficiency     Disposition:  Mr. Villagran appears stable from a hematologic standpoint. The anemia is most likely related to renal insufficiency or myelodysplasia. No clinical evidence for development of a lymphoproliferative disorder. He will return for an office visit and CBC in 6 months.  15 minutes were spent with the patient today. The majority of the  time was used for counseling and coordination of care.  Betsy Coder, MD  12/25/2016  11:35 AM

## 2016-12-29 ENCOUNTER — Other Ambulatory Visit: Payer: Self-pay | Admitting: Cardiovascular Disease

## 2017-01-12 ENCOUNTER — Encounter: Payer: Self-pay | Admitting: Cardiovascular Disease

## 2017-01-12 ENCOUNTER — Ambulatory Visit (INDEPENDENT_AMBULATORY_CARE_PROVIDER_SITE_OTHER): Payer: Medicare HMO | Admitting: Cardiovascular Disease

## 2017-01-12 VITALS — BP 146/60 | HR 61 | Ht 66.0 in | Wt 168.0 lb

## 2017-01-12 DIAGNOSIS — I2510 Atherosclerotic heart disease of native coronary artery without angina pectoris: Secondary | ICD-10-CM | POA: Diagnosis not present

## 2017-01-12 DIAGNOSIS — E039 Hypothyroidism, unspecified: Secondary | ICD-10-CM | POA: Diagnosis not present

## 2017-01-12 DIAGNOSIS — N183 Chronic kidney disease, stage 3 unspecified: Secondary | ICD-10-CM

## 2017-01-12 DIAGNOSIS — I10 Essential (primary) hypertension: Secondary | ICD-10-CM | POA: Diagnosis not present

## 2017-01-12 DIAGNOSIS — I480 Paroxysmal atrial fibrillation: Secondary | ICD-10-CM | POA: Diagnosis not present

## 2017-01-12 DIAGNOSIS — E7849 Other hyperlipidemia: Secondary | ICD-10-CM

## 2017-01-12 DIAGNOSIS — E784 Other hyperlipidemia: Secondary | ICD-10-CM

## 2017-01-12 NOTE — Patient Instructions (Addendum)
Your physician wants you to follow-up in: 6 months or sooner if needed. You will receive a reminder letter in the mail two months in advance. If you don't receive a letter, please call our office to schedule the follow-up appointment.   If you need a refill on your cardiac medications before your next appointment, please call your pharmacy.   Your physician recommends that you return for lab work.   No changes were made today in your therapy.

## 2017-01-12 NOTE — Progress Notes (Signed)
Patient ID: John Peck, male   DOB: 30-Apr-1924, 83 y.o.   MRN: [id number]      HPI: John Peck is a 83 y.o. male who presents to the office today for a 4 month follow-up cardiology evaluation.  John Peck has  CAD and underwent CABG surgery with LIMA to the LAD, a vein to the circumflex in 1989.  In 2004 he underwent stenting to his RCA. Cardiac catheterization in January 2013 showed an ejection fraction of 50% with mild inferior, distal inferior and apical lateral hypocontractility. He had significant native CAD with 80% stenosis of the LAD after the second diagonal vessel and septal perforator artery with total occlusion of the mid LAD. He was 19 - 70% ostial stenosis in the circumflex vessel followed by 40% proximal stenosis and 7080% distal stenosis. He had an occluded Y graft which previously sequentially supplied the intermediate and distal circumflex.There was a patent stented proximal RCA with 90% stenosis in the anterior RV marginal branch arising from the mid RCA and had 70-80% stenosis in the distal RCA beyond the crux with 78% stenosis the PDA takeoff. His LIMA to LAD was patent. He has been on medical therapy. He has a history of atrial fibrillation, sick sinus syndrome and status post permanent pacemaker in 2009 which is a St. Jude device. Additional problems include iron deficiency anemia, hypertension, BPH, status post TURP in 1992, remote history of liver abscess, history of COPD.  In March 2015 due to recurrent increasing symptoms of chest pain he underwent repeat cardiac catheterization which revealed mild LV dysfunction with an EF of 45% inferior wall hypocontractility. There was significant multivessel native coronary obstructive disease with 50% ostial LAD stenosis followed by 80% proximal stenosis in the region of the first diagonal takeoff followed by 80% stenosis after the second diagonal vessel; total occlusion of the ramus intermediate vessel with evidence for  bidirectional retrograde collateralization; 40% ostial left circumflex stenosis; and widely patent proximal RCA stent with 80% stenosis in the anterior RV marginal branch, 40% mid or CVA stenosis, 50% distal, and 50% ostial and mid PDA stenoses. He had a patent LIMA graft supplying the mid LAD and an occluded Y graft which previously supplied the intermediate and distal circumflex.  He has been on increased medical regimen consisting of aspirin, Plavix, carvedilol 25 mg twice a day, high dose oral nitrate therapy, Ranexa 1000 g twice a day.  Amlodipine 10 mg in addition to Crestor 20 mg.  He also is taking Lasix 20 mg with supplemental potassium.  He was seen in the office by John Peck and admitted to bilateral lower extremity pain and weakness.  His blood pressure was somewhat low and his amlodipine dose was reduced from 10 to 5 mg. Lower extremity Doppler studies showed an ABI of 0.5 on the right and 0.58 on the left.  He had mild dilatation of his distal abdominal aorta.  There was equal to less than 50% diameter reduction.  The right SFA and right popliteal artery and there was a greater than 60% diameter reduction in the left SFA.  However, there was one vessel runoff via the peroneal artery bilaterally.  His symptoms are worse in the right lower extremity due to his distal disease.  He has  had done well with his increased medical regimen including Ranexa, high-dose nitrate therapy, amlodipine and carvedilol.  A follow-up Doppler study in August 2015 showed ABI of 0.5 on the right and 0.58 on the left.  He has  poor distal runoff.  His Doppler studies were reviewed with John Peck who agreed with medical therapy.  He already was on aspirin and Plavix  He was hospitalized in January 2017 with chest pain and shortness of breath.  He without for an MI.  He was treated for COPD exacerbation.  He underwent repeat cardiac catheterization by John Peck.  He was Peck to have an occluded mid LAD with  presumptive filling of the distal LAD by the LIMA graft, although during this catheterization.  He was unable to selectively engage the LIMA graft.  He had mild proximal disease in the circumflex vessel.  There was a widely patent stent in the RCA, but he developed a new distal RCA stenosis for which he underwent successful intervention.  He also had 90%.  RV marginal stenosis esters [place] still notes rare to occasional episodes of chest pain which do improve following sl nitroglycerin administration.  He has had some mild blood pressure lability.   When I saw him in 2017, with his episodes of rare to occasional chest pain and blood pressure elevation  I reinitiated amlodipine at 5 mg.  when he was last seen in September 2017.  His blood pressure was improved with the addition of amlodipine.  The past several months, he denies any episodes of chest pain.  He admits to occasional episodes of shortness of breath.  He is unaware of palpitations.  He denies bleeding issues.  He has been on amlodipine at 2.5 mg, carvedilol 25 mg twice a day, isosorbide 120 mg in the morning and 60 mg at night, his internal Ranexa 1000 mg twice a day.  Has been taking furosemide 20 mg daily.  His levothyroxine dose had been increased after his TSH was elevated at 8.29 in September 2017 and on follow-up in October 2017 on 0.125 mg daily TSH was 0.87.  He presents for follow-up evaluation.  Past Medical History:  Diagnosis Date  . Anemia   . CAD (coronary artery disease)    a. CABG '89; b. PCI '04; c. 11/2015 Cath/PCI: LM 20ost, LAD 12m LCX 30p, OM2 40, RCA 30p, 10p ISR, 90d (3.0x12 Synergy DES), AM 90, VG->OM2 known to be 100, LIMA->LAD not injected, patent in 2015.  .Marland KitchenChronic combined systolic and diastolic CHF (congestive heart failure) ([facility]   . CKD (chronic kidney disease), stage III   . H/O: GI bleed    REMOTE HISTORY  . History of COPD   . Hyperlipidemia   . Hypertensive heart disease   . LBBB (left bundle branch  block)   . Pacemaker Sept 2009   St Jude  . PAF (paroxysmal atrial fibrillation) (HCC)    a. not on anticoag due to prior GIB.  . Sick sinus syndrome ([facility] Sept 2009   ST Jude PTVDP    Past Surgical History:  Procedure Laterality Date  . CARDIAC CATHETERIZATION  11/2011   EF 50%; significant native CAD w/80% stenosis of the LAD after 2nd giagonal vessel and septal perforating artery w/total occlusion of the mid left anterior descendiung.; 60-70% ostiasl stenosis in the circumflex vessel followed by 40% proximal stenosis and 70-80% distal circumflex stenosis;   . CARDIAC CATHETERIZATION N/A 11/29/2015   Procedure: Left Heart Cath and Cors/Grafts Angiography;  Surgeon: CBurnell Blanks MD;  [facility];  Service: Cardiovascular;  Laterality: N/A;  . CARDIAC CATHETERIZATION N/A 11/29/2015   Procedure: Coronary Stent Intervention;  Surgeon: CBurnell Blanks MD;  [facility];  Service: Cardiovascular;  Laterality: N/A;  . CATARACT EXTRACTION  2004  . CORONARY ANGIOPLASTY WITH STENT PLACEMENT  2004   RCA  . CORONARY ARTERY BYPASS GRAFT  1989   had LIMA to his LAD, a vein to the circumflex. In 2004 underwent stenting to his right coronary artery.  Marland Kitchen HEMORRHOID SURGERY    . INSERT / REPLACE / REMOVE PACEMAKER  07/17/08   DUAL-CHAMBER; PPM-ST.JUDE MEDNET  . LEFT HEART CATHETERIZATION WITH CORONARY/GRAFT ANGIOGRAM N/A 12/10/2011   Procedure: LEFT HEART CATHETERIZATION WITH Beatrix Fetters;  Surgeon: Troy Sine, MD;  Location: [facility] CATH LAB;  Service: Cardiovascular;  Laterality: N/A;  . LEFT HEART CATHETERIZATION WITH CORONARY/GRAFT ANGIOGRAM N/A 01/26/2014   Procedure: LEFT HEART CATHETERIZATION WITH Beatrix Fetters;  Surgeon: Troy Sine, MD;  Location: [facility] C[facility] LAB;  Service: Cardiovascular;  Laterality: N/A;  . PROSTATECTOMY      No Known Allergies  Current Outpatient Prescriptions  Medication Sig Dispense Refill  .  amLODipine (NORVASC) 2.5 MG tablet Take 2.5 mg by mouth daily.  11  . aspirin 81 MG tablet Take 81 mg by mouth daily.     Marland Kitchen atorvastatin (LIPITOR) 40 MG tablet Take 1 tablet (40 mg total) by mouth daily. 30 tablet 6  . carvedilol (COREG) 25 MG tablet Take 25 mg by mouth 2 (two) times daily with a meal.      . clopidogrel (PLAVIX) 75 MG tablet Take 75 mg by mouth daily at 3 pm.     . ferrous sulfate 325 (65 FE) MG tablet Take 325 mg by mouth 3 (three) times daily with meals.     . furosemide (LASIX) 20 MG tablet Take 20 mg by mouth daily.    . isosorbide mononitrate (IMDUR) 60 MG 24 hr tablet TAKE 2 TABLETS BY MOUTH EVERY MORNING AND 1 TABLET IN THE EVENING (Patient taking differently: Take 30-60 mg by mouth 2 (two) times daily. ) 270 tablet 1  . latanoprost (XALATAN) 0.005 % ophthalmic solution Place 1 drop into both eyes daily.  4  . levothyroxine (SYNTHROID, LEVOTHROID) 125 MCG tablet TAKE 1 TABLET BY MOUTH EVERY DAY BEFORE BREAKFAST 30 tablet 0  . lubiprostone (AMITIZA) 24 MCG capsule Take 24 mcg by mouth daily as needed. For bowel movement    . Menthol, Topical Analgesic, (ICY HOT EX) Apply 1 application topically daily as needed (aches and pains).    . nitroGLYCERIN (NITROSTAT) 0.4 MG SL tablet PLACE 1 TABLET UNDER THE TONGUE AS NEEDED. 25 tablet 3  . pantoprazole (PROTONIX) 40 MG tablet Take 40 mg by mouth 2 (two) times daily.    . ranolazine (RANEXA) 1000 MG SR tablet Take 1 tablet (1,000 mg total) by mouth 2 (two) times daily. 180 tablet 1  . tamsulosin (FLOMAX) 0.4 MG CAPS capsule Take 0.4 mg by mouth daily.  2   No current facility-administered medications for this visit.     Social History   Social History  . Marital status: Married    Spouse name: N/A  . Number of children: 5  . Years of education: N/A   Occupational History  . RETIRED Retired    Banker DRIVER   Social History Main Topics  . Smoking status: Former Smoker    Packs/day: 2.00    Years: 65.00    Types:  Cigarettes  . Smokeless tobacco: Former Systems developer    Quit date: 10/29/2008  . Alcohol use No  . Drug use: No  . Sexual activity: Not on file  Other Topics Concern  . Not on file   Social History Narrative  . No narrative on file    Family History  Problem Relation Age of Onset  . Diabetes Father   . Heart disease Sister   . Heart disease Brother   . Heart attack Brother   . Stroke Brother   . Hypertension Neg Hx     ROS General: Negative; No fevers, chills, or night sweats;  HEENT: Negative; No changes in vision or hearing, sinus congestion, difficulty swallowing Pulmonary: Negative; No cough, wheezing, shortness of breath, hemoptysis Cardiovascular: See history of present illness  Positive for claudication, right lower extremity greater than left. GI: Negative; No nausea, vomiting, diarrhea, or abdominal pain GU: Negative; No dysuria, hematuria, or difficulty voiding Musculoskeletal: Negative; no myalgias, joint pain, or weakness Hematologic/Oncology: Negative; no easy bruising, bleeding Endocrine: Negative; no heat/cold intolerance; no diabetes Neuro: Negative; no changes in balance, headaches Skin: Negative; No rashes or skin lesions Psychiatric: Negative; No behavioral problems, depression Sleep: Negative; No snoring, daytime sleepiness, hypersomnolence, bruxism, restless legs, hypnogognic hallucinations, no cataplexy Other comprehensive 14 point system review is negative.   PE BP (!) 146/60   Pulse 61   Ht 5' 6" (1.676 m)   Wt 168 lb (76.2 kg)   BMI 27.12 kg/m    Repeat blood pressure when taken by me was 120/72.  Wt Readings from Last 3 Encounters:  01/12/17 168 lb (76.2 kg)  12/25/16 166 lb 12.8 oz (75.7 kg)  10/12/16 166 lb 3.2 oz (75.4 kg)   General: Alert, oriented, no distress.  Skin: normal turgor, no rashes HEENT: Normocephalic, atraumatic. Pupils round and reactive; sclera anicteric;no lid lag.  Nose without nasal septal hypertrophy Mouth/Parynx  benign; Mallinpatti scale 3 Neck: No JVD, no carotid bruits; normal carotid upstroke Lungs: clear to ausculatation and percussion; no wheezing or rales Chest wall: Nontender to palpation Heart: RRR, s1 s2 normal 1/6 systolic murmur; no diastolic murmur.  No S3 gallop.  No rubs, thrills or heaves.. Abdomen: Mild central adiposity ;soft, nontender; no hepatosplenomehaly, BS+; abdominal aorta nontender and not dilated by palpation. Back: No CVA tenderness Pulses 2+ upper extremity.  Bilateral femoral bruits.  Decreased pulses distally in the dorsalis pedis. Extremities: 1+ pedal edema bilaterally with ankle edema, right greater than left. no  clubbing cyanosis, Homan's sign negative  Neurologic: grossly nonfocal Psychologic: normal affect and mood.  ECG (independently read by me): Paced rhythm with 100% capture at 61 bpm.  September 2017 ECG (independently read by me): AV paced rhythm at 70 bpm.  PR interval 206 ms.  May 2017 ECG (independently read by me): AV paced rhythm at 60 bpm.  PR interval 202 ms.  October 2016 ECG (independently read by me): AV dual paced rhythm with 100% capture.  Ventricular rate 60 bpm.  PR interval 206 ms.  April 2016 ECG  (Independently read by me): Ventricular paced rhythm at 62 bpm; unchanged  October 2015 ECG  (Independently read by me): Ventricular paced rhythm.  Unchanged from prior ECG.  LABS:  BMP Latest Ref Rng & Units 08/28/2016 07/15/2016 04/09/2016  Glucose 65 - 99 mg/dL 105(H) 89 93  BUN 6 - 20 mg/dL 20 21 26(H)  Creatinine 0.61 - 1.24 mg/dL 1.28(H) 1.54(H) 1.66(H)  Sodium 135 - 145 mmol/L 141 140 136  Potassium 3.5 - 5.1 mmol/L 4.5 4.1 5.1  Chloride 101 - 111 mmol/L 108 103 102  CO2 22 - 32 mmol/L _0 Calcium 8.9 - 10.3  mg/dL 9.3 8.8 9.1    Hepatic Function Latest Ref Rng & Units 04/09/2016 11/30/2015 11/01/2015  Total Protein 6.1 - 8.1 g/dL 6.9 5.9(L) 6.5  Albumin 3.6 - 5.1 g/dL 4.0 3.0(L) 3.6  AST 10 - 35 U/L _0 ALT 9 - 46 U/L  7(L) 20 7(L)  Alk Phosphatase 40 - 115 U/L 92 65 83  Total Bilirubin 0.2 - 1.2 mg/dL 0.8 0.8 0.7  Bilirubin, Direct 0.1 - 0.5 mg/dL - - -     CBC Latest Ref Rng & Units 12/25/2016 08/28/2016 07/17/2016  WBC 4.0 - 10.3 10e3/uL 4.1 5.2 3.3(L)  Hemoglobin 13.0 - 17.1 g/dL 11.0(L) 11.5(L) 10.2(L)  Hematocrit 38.4 - 49.9 % 32.2(L) 34.5(L) 30.3(L)  Platelets 140 - 400 10e3/uL 141 157 107(L)    Lab Results  Component Value Date   TSH 0.87 08/24/2016      BNP    Component Value Date/Time   PROBNP 304.0 (H) 11/12/2008 1154    Lipid Panel     Component Value Date/Time   CHOL 180 04/09/2016 0804   TRIG 96 04/09/2016 0804   HDL 60 04/09/2016 0804   CHOLHDL 3.0 04/09/2016 0804   VLDL 19 04/09/2016 0804   LDLCALC 101 04/09/2016 0804     RADIOLOGY: No results Peck.  IMPRESSION:  1. CAD in native artery   2. Essential hypertension   3. PAF (paroxysmal atrial fibrillation) (HCC)   4. Other hyperlipidemia   5. CKD (chronic kidney disease) stage 3, GFR 30-59 ml/min   6. Hypothyroidism, unspecified type     ASSESSMENT AND PLAN: John Peck is an 83 year old gentleman who underwent initial bypass surgery in 1989 and stenting to his RCA in 2004.  He had been fairly stable on a medical regimen for his CAD and had developed recurrent chest pain symptomatology in the setting of possible COPD exacerbation in January 2017.  Repeat cardiac catheterization revealed progression of his distal RCA which was successfully stented.  He continues to be on medical therapy for his CAD and has documented occluded graft which had supplied his circumflex.  He had been on amlodipine, which was started for elevated blood pressure and when I last saw him, he had experienced bilateral pedal and ankle edema, right leg greater than left and amlodipine was reduced to 2.5 mg daily.  His edema has improved on the reduced but daily 20 mg Lasix dose.  I am rechecking chemistry profile, lipid studies and TSH.  His  hemoglobin was recently checked by John Peck  with reference to his anemia.  I will contact him and medication adjustments as necessary.  I will see him in 6 months for reevaluation. Time spent: 25 minutes  Troy Sine, MD, [facility]  01/12/2017 12:54 PM

## 2017-01-14 DIAGNOSIS — I480 Paroxysmal atrial fibrillation: Secondary | ICD-10-CM | POA: Diagnosis not present

## 2017-01-14 DIAGNOSIS — E784 Other hyperlipidemia: Secondary | ICD-10-CM | POA: Diagnosis not present

## 2017-01-14 DIAGNOSIS — I10 Essential (primary) hypertension: Secondary | ICD-10-CM | POA: Diagnosis not present

## 2017-01-14 DIAGNOSIS — I2510 Atherosclerotic heart disease of native coronary artery without angina pectoris: Secondary | ICD-10-CM | POA: Diagnosis not present

## 2017-01-14 LAB — COMPREHENSIVE METABOLIC PANEL
ALT: 8 U/L — ABNORMAL LOW (ref 9–46)
AST: 12 U/L (ref 10–35)
Albumin: 3.9 g/dL (ref 3.6–5.1)
Alkaline Phosphatase: 88 U/L (ref 40–115)
BILIRUBIN TOTAL: 0.9 mg/dL (ref 0.2–1.2)
BUN: 19 mg/dL (ref 7–25)
CO2: 25 mmol/L (ref 20–31)
CREATININE: 1.23 mg/dL — AB (ref 0.70–1.11)
Calcium: 9 mg/dL (ref 8.6–10.3)
Chloride: 106 mmol/L (ref 98–110)
GLUCOSE: 86 mg/dL (ref 65–99)
Potassium: 3.9 mmol/L (ref 3.5–5.3)
SODIUM: 142 mmol/L (ref 135–146)
Total Protein: 6.9 g/dL (ref 6.1–8.1)

## 2017-01-14 LAB — LIPID PANEL
Cholesterol: 197 mg/dL (ref <200)
HDL: 48 mg/dL (ref >40)
LDL CALC: 124 mg/dL — AB (ref <100)
Total CHOL/HDL Ratio: 4.1 Ratio (ref <5.0)
Triglycerides: 125 mg/dL (ref <150)
VLDL: 25 mg/dL (ref <30)

## 2017-01-14 LAB — TSH: TSH: 0.44 m[IU]/L (ref 0.40–4.50)

## 2017-01-21 ENCOUNTER — Encounter: Payer: Self-pay | Admitting: Internal Medicine

## 2017-01-21 DIAGNOSIS — I495 Sick sinus syndrome: Secondary | ICD-10-CM | POA: Diagnosis not present

## 2017-01-30 ENCOUNTER — Other Ambulatory Visit: Payer: Self-pay | Admitting: Cardiovascular Disease

## 2017-02-11 DIAGNOSIS — R072 Precordial pain: Secondary | ICD-10-CM | POA: Diagnosis not present

## 2017-02-11 DIAGNOSIS — Z950 Presence of cardiac pacemaker: Secondary | ICD-10-CM | POA: Diagnosis not present

## 2017-02-11 DIAGNOSIS — R0602 Shortness of breath: Secondary | ICD-10-CM | POA: Diagnosis not present

## 2017-02-11 DIAGNOSIS — I447 Left bundle-branch block, unspecified: Secondary | ICD-10-CM | POA: Diagnosis not present

## 2017-02-11 DIAGNOSIS — E784 Other hyperlipidemia: Secondary | ICD-10-CM | POA: Diagnosis not present

## 2017-02-12 ENCOUNTER — Other Ambulatory Visit: Payer: Self-pay | Admitting: Cardiovascular Disease

## 2017-02-12 ENCOUNTER — Ambulatory Visit
Admission: RE | Admit: 2017-02-12 | Discharge: 2017-02-12 | Disposition: A | Discharge status: Home / Self Care | Payer: Medicare HMO | Source: Ambulatory Visit | Attending: Cardiovascular Disease | Admitting: Cardiovascular Disease

## 2017-02-12 DIAGNOSIS — R0602 Shortness of breath: Secondary | ICD-10-CM

## 2017-02-12 DIAGNOSIS — E785 Hyperlipidemia, unspecified: Secondary | ICD-10-CM | POA: Diagnosis not present

## 2017-02-12 DIAGNOSIS — E559 Vitamin D deficiency, unspecified: Secondary | ICD-10-CM | POA: Diagnosis not present

## 2017-02-12 DIAGNOSIS — Z79899 Other long term (current) drug therapy: Secondary | ICD-10-CM | POA: Diagnosis not present

## 2017-02-12 DIAGNOSIS — E876 Hypokalemia: Secondary | ICD-10-CM | POA: Diagnosis not present

## 2017-02-12 DIAGNOSIS — R05 Cough: Secondary | ICD-10-CM | POA: Diagnosis not present

## 2017-02-12 DIAGNOSIS — N429 Disorder of prostate, unspecified: Secondary | ICD-10-CM | POA: Diagnosis not present

## 2017-02-12 DIAGNOSIS — D649 Anemia, unspecified: Secondary | ICD-10-CM | POA: Diagnosis not present

## 2017-02-17 DIAGNOSIS — N35013 Post-traumatic anterior urethral stricture: Secondary | ICD-10-CM | POA: Diagnosis not present

## 2017-02-17 DIAGNOSIS — N3000 Acute cystitis without hematuria: Secondary | ICD-10-CM | POA: Diagnosis not present

## 2017-02-23 ENCOUNTER — Emergency Department (HOSPITAL_COMMUNITY): Payer: Medicare HMO

## 2017-02-23 ENCOUNTER — Encounter (HOSPITAL_COMMUNITY): Payer: Self-pay | Admitting: Emergency Medicine

## 2017-02-23 ENCOUNTER — Inpatient Hospital Stay (HOSPITAL_COMMUNITY)
Admission: EM | Admit: 2017-02-23 | Discharge: 2017-02-26 | DRG: 247 | Disposition: A | Discharge status: Home Health | Payer: Medicare HMO | Attending: Cardiovascular Disease | Admitting: Cardiovascular Disease

## 2017-02-23 DIAGNOSIS — I200 Unstable angina: Secondary | ICD-10-CM | POA: Diagnosis not present

## 2017-02-23 DIAGNOSIS — E039 Hypothyroidism, unspecified: Secondary | ICD-10-CM | POA: Diagnosis present

## 2017-02-23 DIAGNOSIS — J449 Chronic obstructive pulmonary disease, unspecified: Secondary | ICD-10-CM | POA: Diagnosis present

## 2017-02-23 DIAGNOSIS — I130 Hypertensive heart and chronic kidney disease with heart failure and stage 1 through stage 4 chronic kidney disease, or unspecified chronic kidney disease: Secondary | ICD-10-CM | POA: Diagnosis present

## 2017-02-23 DIAGNOSIS — Z7902 Long term (current) use of antithrombotics/antiplatelets: Secondary | ICD-10-CM

## 2017-02-23 DIAGNOSIS — I214 Non-ST elevation (NSTEMI) myocardial infarction: Secondary | ICD-10-CM | POA: Diagnosis not present

## 2017-02-23 DIAGNOSIS — K5900 Constipation, unspecified: Secondary | ICD-10-CM | POA: Diagnosis not present

## 2017-02-23 DIAGNOSIS — I495 Sick sinus syndrome: Secondary | ICD-10-CM

## 2017-02-23 DIAGNOSIS — Z87891 Personal history of nicotine dependence: Secondary | ICD-10-CM | POA: Diagnosis not present

## 2017-02-23 DIAGNOSIS — I25810 Atherosclerosis of coronary artery bypass graft(s) without angina pectoris: Secondary | ICD-10-CM | POA: Diagnosis not present

## 2017-02-23 DIAGNOSIS — Z955 Presence of coronary angioplasty implant and graft: Secondary | ICD-10-CM

## 2017-02-23 DIAGNOSIS — I739 Peripheral vascular disease, unspecified: Secondary | ICD-10-CM | POA: Diagnosis present

## 2017-02-23 DIAGNOSIS — I480 Paroxysmal atrial fibrillation: Secondary | ICD-10-CM | POA: Diagnosis present

## 2017-02-23 DIAGNOSIS — N400 Enlarged prostate without lower urinary tract symptoms: Secondary | ICD-10-CM | POA: Diagnosis present

## 2017-02-23 DIAGNOSIS — R079 Chest pain, unspecified: Secondary | ICD-10-CM | POA: Diagnosis not present

## 2017-02-23 DIAGNOSIS — E785 Hyperlipidemia, unspecified: Secondary | ICD-10-CM | POA: Diagnosis not present

## 2017-02-23 DIAGNOSIS — I471 Supraventricular tachycardia: Secondary | ICD-10-CM | POA: Diagnosis not present

## 2017-02-23 DIAGNOSIS — Z950 Presence of cardiac pacemaker: Secondary | ICD-10-CM

## 2017-02-23 DIAGNOSIS — Z79899 Other long term (current) drug therapy: Secondary | ICD-10-CM

## 2017-02-23 DIAGNOSIS — Z951 Presence of aortocoronary bypass graft: Secondary | ICD-10-CM

## 2017-02-23 DIAGNOSIS — I255 Ischemic cardiomyopathy: Secondary | ICD-10-CM | POA: Diagnosis present

## 2017-02-23 DIAGNOSIS — I25110 Atherosclerotic heart disease of native coronary artery with unstable angina pectoris: Secondary | ICD-10-CM | POA: Diagnosis not present

## 2017-02-23 DIAGNOSIS — N183 Chronic kidney disease, stage 3 (moderate): Secondary | ICD-10-CM | POA: Diagnosis present

## 2017-02-23 DIAGNOSIS — Z7982 Long term (current) use of aspirin: Secondary | ICD-10-CM

## 2017-02-23 DIAGNOSIS — I5042 Chronic combined systolic (congestive) and diastolic (congestive) heart failure: Secondary | ICD-10-CM | POA: Diagnosis present

## 2017-02-23 LAB — BASIC METABOLIC PANEL
Anion gap: 8 (ref 5–15)
BUN: 18 mg/dL (ref 6–20)
CO2: 26 mmol/L (ref 22–32)
CREATININE: 1.25 mg/dL — AB (ref 0.61–1.24)
Calcium: 8.9 mg/dL (ref 8.9–10.3)
Chloride: 104 mmol/L (ref 101–111)
GFR calc non Af Amer: 49 mL/min — ABNORMAL LOW (ref >60)
GFR, EST AFRICAN AMERICAN: 57 mL/min — AB (ref >60)
Glucose, Bld: 101 mg/dL — ABNORMAL HIGH (ref 65–99)
POTASSIUM: 3.6 mmol/L (ref 3.5–5.1)
SODIUM: 138 mmol/L (ref 135–145)

## 2017-02-23 LAB — PROTIME-INR
INR: 1.19
Prothrombin Time: 15.2 seconds (ref 11.4–15.2)

## 2017-02-23 LAB — HEPATIC FUNCTION PANEL
ALT: 14 U/L — AB (ref 17–63)
AST: 30 U/L (ref 15–41)
Albumin: 3.4 g/dL — ABNORMAL LOW (ref 3.5–5.0)
Alkaline Phosphatase: 82 U/L (ref 38–126)
BILIRUBIN DIRECT: 0.9 mg/dL — AB (ref 0.1–0.5)
BILIRUBIN INDIRECT: 0.9 mg/dL (ref 0.3–0.9)
Total Bilirubin: 1.8 mg/dL — ABNORMAL HIGH (ref 0.3–1.2)
Total Protein: 6.7 g/dL (ref 6.5–8.1)

## 2017-02-23 LAB — TROPONIN I: Troponin I: <0.03 ng/mL (ref <0.03)

## 2017-02-23 LAB — BRAIN NATRIURETIC PEPTIDE: B NATRIURETIC PEPTIDE 5: 530.4 pg/mL — AB (ref 0.0–100.0)

## 2017-02-23 LAB — CBC
HCT: 33.5 % — ABNORMAL LOW (ref 39.0–52.0)
Hemoglobin: 11.4 g/dL — ABNORMAL LOW (ref 13.0–17.0)
MCH: 31.7 pg (ref 26.0–34.0)
MCHC: 34 g/dL (ref 30.0–36.0)
MCV: 93.1 fL (ref 78.0–100.0)
PLATELETS: 184 10*3/uL (ref 150–400)
RBC: 3.6 MIL/uL — AB (ref 4.22–5.81)
RDW: 13.7 % (ref 11.5–15.5)
WBC: 4.7 10*3/uL (ref 4.0–10.5)

## 2017-02-23 LAB — I-STAT TROPONIN, ED: Troponin i, poc: 0 ng/mL (ref 0.00–0.08)

## 2017-02-23 LAB — HEPARIN LEVEL (UNFRACTIONATED): Heparin Unfractionated: 0.52 IU/mL (ref 0.30–0.70)

## 2017-02-23 MED ORDER — ASPIRIN 81 MG PO CHEW: 324.0000 mg | CHEWABLE_TABLET | ORAL | Status: DC

## 2017-02-23 MED ORDER — CLOPIDOGREL BISULFATE 75 MG PO TABS
75.0000 mg | ORAL_TABLET | Freq: Every day | ORAL | Status: DC
  Administered 2017-02-23 – 2017-02-26 (×3): 75 mg via ORAL
  Filled 2017-02-23 (×4): qty 1

## 2017-02-23 MED ORDER — ONDANSETRON HCL 4 MG/2ML IJ SOLN: 4.0000 mg | Freq: Four times a day (QID) | INTRAMUSCULAR | Status: DC | PRN

## 2017-02-23 MED ORDER — DOXYCYCLINE HYCLATE 100 MG PO TABS
100.0000 mg | ORAL_TABLET | Freq: Two times a day (BID) | ORAL | Status: DC
  Administered 2017-02-24 – 2017-02-26 (×5): 100 mg via ORAL
  Filled 2017-02-23 (×6): qty 1

## 2017-02-23 MED ORDER — HEPARIN BOLUS VIA INFUSION
4000.0000 [IU] | Freq: Once | INTRAVENOUS | Status: AC
  Administered 2017-02-23: 4000 [IU] via INTRAVENOUS
  Filled 2017-02-23: qty 4000

## 2017-02-23 MED ORDER — SODIUM CHLORIDE 0.9 % IV SOLN: 250.0000 mL | INTRAVENOUS | Status: DC | PRN

## 2017-02-23 MED ORDER — AMLODIPINE BESYLATE 2.5 MG PO TABS
2.5000 mg | ORAL_TABLET | Freq: Every day | ORAL | Status: DC
  Administered 2017-02-23 – 2017-02-24 (×2): 2.5 mg via ORAL
  Filled 2017-02-23: qty 1

## 2017-02-23 MED ORDER — TAMSULOSIN HCL 0.4 MG PO CAPS
0.4000 mg | ORAL_CAPSULE | Freq: Every day | ORAL | Status: DC
  Administered 2017-02-23 – 2017-02-26 (×4): 0.4 mg via ORAL
  Filled 2017-02-23 (×3): qty 1

## 2017-02-23 MED ORDER — NITROGLYCERIN IN D5W 200-5 MCG/ML-% IV SOLN
0.0000 ug/min | INTRAVENOUS | Status: DC
  Administered 2017-02-23: 5 ug/min via INTRAVENOUS
  Administered 2017-02-25: 50000 ug via INTRAVENOUS

## 2017-02-23 MED ORDER — ASPIRIN 300 MG RE SUPP: 300.0000 mg | RECTAL | Status: DC

## 2017-02-23 MED ORDER — ASPIRIN 325 MG PO TABS
325.0000 mg | ORAL_TABLET | Freq: Once | ORAL | Status: AC
  Administered 2017-02-23: 325 mg via ORAL
  Filled 2017-02-23: qty 1

## 2017-02-23 MED ORDER — PANTOPRAZOLE SODIUM 40 MG PO TBEC
40.0000 mg | DELAYED_RELEASE_TABLET | Freq: Two times a day (BID) | ORAL | Status: DC
  Administered 2017-02-23 – 2017-02-26 (×5): 40 mg via ORAL
  Filled 2017-02-23 (×5): qty 1

## 2017-02-23 MED ORDER — NITROGLYCERIN IN D5W 200-5 MCG/ML-% IV SOLN
INTRAVENOUS | Status: AC
  Filled 2017-02-23: qty 250

## 2017-02-23 MED ORDER — ATORVASTATIN CALCIUM 80 MG PO TABS
80.0000 mg | ORAL_TABLET | Freq: Every day | ORAL | Status: DC
  Administered 2017-02-23 – 2017-02-25 (×3): 80 mg via ORAL
  Filled 2017-02-23 (×4): qty 1

## 2017-02-23 MED ORDER — ASPIRIN EC 81 MG PO TBEC
81.0000 mg | DELAYED_RELEASE_TABLET | Freq: Every day | ORAL | Status: DC
  Administered 2017-02-24 – 2017-02-26 (×4): 81 mg via ORAL
  Filled 2017-02-23 (×5): qty 1

## 2017-02-23 MED ORDER — LUBIPROSTONE 24 MCG PO CAPS
24.0000 ug | ORAL_CAPSULE | Freq: Every day | ORAL | Status: DC | PRN
  Administered 2017-02-24: 24 ug via ORAL
  Filled 2017-02-23 (×3): qty 1

## 2017-02-23 MED ORDER — LEVOTHYROXINE SODIUM 25 MCG PO TABS
125.0000 ug | ORAL_TABLET | Freq: Every day | ORAL | Status: DC
  Administered 2017-02-25 – 2017-02-26 (×2): 125 ug via ORAL
  Filled 2017-02-23 (×3): qty 1

## 2017-02-23 MED ORDER — FERROUS SULFATE 325 (65 FE) MG PO TABS
325.0000 mg | ORAL_TABLET | Freq: Three times a day (TID) | ORAL | Status: DC
  Administered 2017-02-24 – 2017-02-26 (×5): 325 mg via ORAL
  Filled 2017-02-23 (×5): qty 1

## 2017-02-23 MED ORDER — RANOLAZINE ER 500 MG PO TB12
1000.0000 mg | ORAL_TABLET | Freq: Two times a day (BID) | ORAL | Status: DC
  Administered 2017-02-23 – 2017-02-26 (×6): 1000 mg via ORAL
  Filled 2017-02-23 (×6): qty 2

## 2017-02-23 MED ORDER — ACETAMINOPHEN 325 MG PO TABS
650.0000 mg | ORAL_TABLET | ORAL | Status: DC | PRN
  Filled 2017-02-23: qty 2

## 2017-02-23 MED ORDER — HEPARIN (PORCINE) IN NACL 100-0.45 UNIT/ML-% IJ SOLN
900.0000 [IU]/h | INTRAMUSCULAR | Status: DC
  Administered 2017-02-23: 900 [IU]/h via INTRAVENOUS
  Filled 2017-02-23: qty 250

## 2017-02-23 MED ORDER — NITROGLYCERIN 0.4 MG SL SUBL
0.4000 mg | SUBLINGUAL_TABLET | SUBLINGUAL | Status: DC | PRN
  Administered 2017-02-25 (×2): 0.4 mg via SUBLINGUAL
  Filled 2017-02-23 (×2): qty 1

## 2017-02-23 MED ORDER — LATANOPROST 0.005 % OP SOLN
1.0000 [drp] | Freq: Every day | OPHTHALMIC | Status: DC
  Administered 2017-02-24 – 2017-02-25 (×2): 1 [drp] via OPHTHALMIC
  Filled 2017-02-23 (×2): qty 2.5

## 2017-02-23 MED ORDER — SODIUM CHLORIDE 0.9% FLUSH: 3.0000 mL | Freq: Two times a day (BID) | INTRAVENOUS | Status: DC

## 2017-02-23 MED ORDER — CARVEDILOL 12.5 MG PO TABS
25.0000 mg | ORAL_TABLET | Freq: Two times a day (BID) | ORAL | Status: DC
  Administered 2017-02-23 – 2017-02-26 (×4): 25 mg via ORAL
  Filled 2017-02-23: qty 1
  Filled 2017-02-23 (×3): qty 2

## 2017-02-23 MED ORDER — SODIUM CHLORIDE 0.9% FLUSH: 3.0000 mL | INTRAVENOUS | Status: DC | PRN

## 2017-02-23 NOTE — ED Notes (Signed)
Attempted report x1. 

## 2017-02-23 NOTE — ED Triage Notes (Signed)
Pt arrives from home with substernal chest pain for the last month that seemed to be getting progressively worse. Pt has nitro tablets at home that he has been taking that "have been helping with the pain". Pt states he has also been having sob that seems worse at night when he lays down.

## 2017-02-23 NOTE — Progress Notes (Signed)
ANTICOAGULATION CONSULT NOTE Pharmacy Consult for Heparin Indication: chest pain/ACS  No Known Allergies  Patient Measurements: Height: 5\' 6"  (167.6 cm) Weight: 162 lb 4.8 oz (73.6 kg) IBW/kg (Calculated) : 63.8  Vital Signs: Temp: 98.1 F (36.7 C) (04/17 2100) Temp Source: Oral (04/17 2100) BP: 139/91 (04/17 2038) Pulse Rate: 102 (04/17 2100)  Labs:  Recent Labs  02/23/17 1123 02/23/17 1515 02/23/17 2042 02/23/17 2303  HGB 11.4*  --   --   --   HCT 33.5*  --   --   --   PLT 184  --   --   --   LABPROT  --  15.2  --   --   INR  --  1.19  --   --   HEPARINUNFRC  --   --   --  0.52  CREATININE 1.25*  --   --   --   TROPONINI  --  <0.03 <0.03  --     Estimated Creatinine Clearance: 35.4 mL/min (A) (by C-G formula based on SCr of 1.25 mg/dL (H)).  Assessment: 83 y.o. male with chest pain for heparin  Goal of Therapy:  Heparin level 0.3-0.7 units/ml Monitor platelets by anticoagulation protocol: Yes   Plan:  Continue Heparin at current rate  Follow-up am labs.   Caryl Pina 02/23/2017,11:55 PM

## 2017-02-23 NOTE — ED Provider Notes (Signed)
[facility] DEPT Provider Note   CSN: [id number] Arrival date & time: 02/23/17  1034     History   Chief Complaint Chief Complaint  Patient presents with  . Chest Pain    HPI John Peck is a 83 y.o. male.  HPI  Pt with hx of extensive CAD s/p CABG, pacemaker, paroxysmal afib, HTN, LBBB presenting with increasing frequency of chest pain.  He states that at his baseline he uses nitroglycerin for chest pain approx once per week- he states that over the past few weeks he has been needing it approx once or twice a day.  Chest pain is substernal , no radiation.  Comes on with very little exertion- getting up to walk to the bathroom from his couch.  At times will come on while sitting still.  No shortness of breath.  Sometimes has some shortness of breath while lying flat at night.  No leg swelling. There are no other associated systemic symptoms, there are no other alleviating or modifying factors.   Past Medical History:  Diagnosis Date  . Anemia   . CAD (coronary artery disease)    a. CABG '89; b. PCI '04; c. 11/2015 Cath/PCI: LM 20ost, LAD 139m, LCX 30p, OM2 40, RCA 30p, 10p ISR, 90d (3.0x12 Synergy DES), AM 90, VG->OM2 known to be 100, LIMA->LAD not injected, patent in 2015.  Marland Kitchen Chronic combined systolic and diastolic CHF (congestive heart failure) ([place])   . CKD (chronic kidney disease), stage III   . H/O: GI bleed    REMOTE HISTORY  . History of COPD   . Hyperlipidemia   . Hypertensive heart disease   . LBBB (left bundle branch block)   . Pacemaker Sept 2009   St Jude  . PAF (paroxysmal atrial fibrillation) (HCC)    a. not on anticoag due to prior GIB.  . Sick sinus syndrome [facility]) Sept 2009   ST Jude PTVDP    Patient Active Problem List   Diagnosis Date Noted  . CAD (coronary artery disease)   . Hypertensive heart disease   . CKD (chronic kidney disease), stage II   . Hyperlipidemia   . Stented coronary artery   . Coronary artery disease involving native  coronary artery of native heart with unstable angina pectoris ([place])   . Chest pain 11/27/2015  . Reactive airway disease 11/27/2015  . Angina pectoris ([place]) 11/27/2015  . CAD in native artery 09/03/2015  . Hyperkalemia 05/09/2015  . Weakness 05/09/2015  . Acute on chronic kidney failure ([place]) 05/09/2015  . Chronic diastolic CHF (congestive heart failure) ([place]) 05/09/2015  . Hyperlipidemia LDL goal <70 02/28/2015  . Atherosclerosis of lower extremity with claudication ([place]) 07/17/2014  . Unstable angina ([place]) 01/23/2014  . PAF (paroxysmal atrial fibrillation) ([place]) 03/24/2013  . Lower extremity edema 03/24/2013  . Sinoatrial node dysfunction ([place]) 08/17/2012  . History of iron deficiency anemia 09/18/2011  . Essential hypertension 07/07/2009  . INTERMEDIATE CORONARY SYNDROME 07/07/2009  . S/P CABG x 2 '89. RCA stent'04. last cath Jan 2013 07/07/2009  . PACEMAKER, PERMANENT- St Jude Sept 2009 07/07/2009  . Sick sinus syndrome ([place]) 07/10/2008    Past Surgical History:  Procedure Laterality Date  . CARDIAC CATHETERIZATION  11/2011   EF 50%; significant native CAD w/80% stenosis of the LAD after 2nd giagonal vessel and septal perforating artery w/total occlusion of the mid left anterior descendiung.; 60-70% ostiasl stenosis in the circumflex vessel followed by 40% proximal stenosis and 70-80% distal circumflex stenosis;   .  CARDIAC CATHETERIZATION N/A 11/29/2015   Procedure: Left Heart Cath and Cors/Grafts Angiography;  Surgeon: Burnell Blanks, MD;  Location: [facility] CV LAB;  Service: Cardiovascular;  Laterality: N/A;  . CARDIAC CATHETERIZATION N/A 11/29/2015   Procedure: Coronary Stent Intervention;  Surgeon: Burnell Blanks, MD;  Location: [facility] CV LAB;  Service: Cardiovascular;  Laterality: N/A;  . CATARACT EXTRACTION  2004  . CORONARY ANGIOPLASTY WITH STENT PLACEMENT  2004   RCA  . CORONARY ARTERY BYPASS GRAFT  1989   had LIMA to his LAD, a vein to the  circumflex. In 2004 underwent stenting to his right coronary artery.  Marland Kitchen HEMORRHOID SURGERY    . INSERT / REPLACE / REMOVE PACEMAKER  07/17/08   DUAL-CHAMBER; PPM-ST.JUDE MEDNET  . LEFT HEART CATHETERIZATION WITH CORONARY/GRAFT ANGIOGRAM N/A 12/10/2011   Procedure: LEFT HEART CATHETERIZATION WITH Beatrix Fetters;  Surgeon: Troy Sine, MD;  Location: [facility] CATH LAB;  Service: Cardiovascular;  Laterality: N/A;  . LEFT HEART CATHETERIZATION WITH CORONARY/GRAFT ANGIOGRAM N/A 01/26/2014   Procedure: LEFT HEART CATHETERIZATION WITH Beatrix Fetters;  Surgeon: Troy Sine, MD;  Location: [facility] CATH LAB;  Service: Cardiovascular;  Laterality: N/A;  . PROSTATECTOMY         Home Medications    Prior to Admission medications   Medication Sig Start Date End Date Taking? Authorizing Provider  amLODipine (NORVASC) 2.5 MG tablet Take 2.5 mg by mouth daily. 10/31/16  Yes Historical Provider, MD  aspirin 81 MG tablet Take 81 mg by mouth daily.    Yes Historical Provider, MD  atorvastatin (LIPITOR) 40 MG tablet Take 1 tablet (40 mg total) by mouth daily. 09/18/15  Yes Troy Sine, MD  carvedilol (COREG) 25 MG tablet Take 25 mg by mouth 2 (two) times daily with a meal.     Yes Historical Provider, MD  clopidogrel (PLAVIX) 75 MG tablet Take 75 mg by mouth daily at 3 pm.    Yes Historical Provider, MD  doxycycline (VIBRA-TABS) 100 MG tablet Take 100 mg by mouth 2 (two) times daily. Started on 02-19-17 for 7 days 02/19/17  Yes Historical Provider, MD  ferrous sulfate 325 (65 FE) MG tablet Take 325 mg by mouth 3 (three) times daily with meals.    Yes Historical Provider, MD  furosemide (LASIX) 20 MG tablet Take 20 mg by mouth daily.   Yes Historical Provider, MD  isosorbide mononitrate (IMDUR) 60 MG 24 hr tablet TAKE 2 TABLETS BY MOUTH EVERY MORNING AND 1 TABLET IN THE EVENING Patient taking differently: Take 30-60 mg by mouth 2 (two) times daily.  09/16/15  Yes Troy Sine, MD  latanoprost  (XALATAN) 0.005 % ophthalmic solution Place 1 drop into both eyes daily. 11/07/15  Yes Historical Provider, MD  levothyroxine (SYNTHROID, LEVOTHROID) 125 MCG tablet TAKE 1 TABLET BY MOUTH EVERY DAY BEFORE BREAKFAST 02/01/17  Yes Troy Sine, MD  lubiprostone (AMITIZA) 24 MCG capsule Take 24 mcg by mouth daily as needed. For bowel movement   Yes Historical Provider, MD  Menthol, Topical Analgesic, (ICY HOT EX) Apply 1 application topically daily as needed (aches and pains).   Yes Historical Provider, MD  pantoprazole (PROTONIX) 40 MG tablet Take 40 mg by mouth 2 (two) times daily.   Yes Historical Provider, MD  ranolazine (RANEXA) 1000 MG SR tablet Take 1 tablet (1,000 mg total) by mouth 2 (two) times daily. 11/27/15  Yes Troy Sine, MD  tamsulosin (FLOMAX) 0.4 MG CAPS capsule Take 0.4 mg by  mouth daily. 02/26/15  Yes Historical Provider, MD  nitroGLYCERIN (NITROSTAT) 0.4 MG SL tablet PLACE 1 TABLET UNDER THE TONGUE AS NEEDED. 07/06/16   Troy Sine, MD    Family History Family History  Problem Relation Age of Onset  . Diabetes Father   . Heart disease Sister   . Heart disease Brother   . Heart attack Brother   . Stroke Brother   . Hypertension Neg Hx     Social History Social History  Substance Use Topics  . Smoking status: Former Smoker    Packs/day: 2.00    Years: 65.00    Types: Cigarettes  . Smokeless tobacco: Former Systems developer    Quit date: 10/29/2008  . Alcohol use No     Allergies   Patient has no known allergies.   Review of Systems Review of Systems  ROS reviewed and all otherwise negative except for mentioned in HPI   Physical Exam Updated Vital Signs BP (!) 141/88 (BP Location: Right Arm)   Pulse 71   Temp 98.1 F (36.7 C) (Oral)   Resp 17   Ht 5\' 6"  (1.676 m)   Wt 72.4 kg   SpO2 97%   BMI 25.76 kg/m  Vitals reviewed Physical Exam  Physical Examination: General appearance - alert, well appearing, and in no distress Mental status - alert, oriented  to person, place, and time Eyes - no conjunctival injection, no scleral icterus Mouth - mucous membranes moist, pharynx normal without lesions Neck - supple, no significant adenopathy Chest - clear to auscultation, no wheezes, rales or rhonchi, symmetric air entry Heart - normal rate, regular rhythm, normal S1, S2, no murmurs, rubs, clicks or gallops Abdomen - soft, nontender, nondistended, no masses or organomegaly Neurological - alert, oriented, normal speech,  Extremities - peripheral pulses normal, no pedal edema, no clubbing or cyanosis Skin - normal coloration and turgor, no rashes   ED Treatments / Results  Labs (all labs ordered are listed, but only abnormal results are displayed) Labs Reviewed  BASIC METABOLIC PANEL - Abnormal; Notable for the following:       Result Value   Glucose, Bld 101 (*)    Creatinine, Ser 1.25 (*)    GFR calc non Af Amer 49 (*)    GFR calc Af Amer 57 (*)    All other components within normal limits  CBC - Abnormal; Notable for the following:    RBC 3.60 (*)    Hemoglobin 11.4 (*)    HCT 33.5 (*)    All other components within normal limits  BRAIN NATRIURETIC PEPTIDE - Abnormal; Notable for the following:    B Natriuretic Peptide 530.4 (*)    All other components within normal limits  HEPATIC FUNCTION PANEL - Abnormal; Notable for the following:    Albumin 3.4 (*)    ALT 14 (*)    Total Bilirubin 1.8 (*)    Bilirubin, Direct 0.9 (*)    All other components within normal limits  TROPONIN I - Abnormal; Notable for the following:    Troponin I 0.03 (*)    All other components within normal limits  LIPID PANEL - Abnormal; Notable for the following:    Cholesterol 224 (*)    LDL Cholesterol 160 (*)    All other components within normal limits  BASIC METABOLIC PANEL - Abnormal; Notable for the following:    GFR calc non Af Amer 55 (*)    All other components within normal limits  CBC -  Abnormal; Notable for the following:    RBC 3.74 (*)     Hemoglobin 11.8 (*)    HCT 34.5 (*)    All other components within normal limits  TROPONIN I  TROPONIN I  PROTIME-INR  HEPARIN LEVEL (UNFRACTIONATED)  HEPARIN LEVEL (UNFRACTIONATED)  I-STAT TROPOININ, ED    EKG  EKG Interpretation  Date/Time:  Tuesday February 23 2017 12:52:26 EDT Ventricular Rate:  66 PR Interval:  236 QRS Duration: 183 QT Interval:  455 QTC Calculation: 477 R Axis:   -64 Text Interpretation:  Accelerated junctional rhythm Left bundle branch block No significant change since last tracing Confirmed by Canary Brim  MD, Briggitte Boline [phone]) on 02/23/2017 2:58:58 PM       Radiology Dg Chest 2 View  Result Date: 02/23/2017 CLINICAL DATA:  Substernal chest pain for 1 month EXAM: CHEST  2 VIEW COMPARISON:  02/12/2017 FINDINGS: Cardiac shadow is stable. Postsurgical changes are again seen. Pacing device is again noted and stable. Extrinsic lead is noted over the right upper lobe. No focal infiltrate or sizable effusion is seen. No acute bony abnormality is noted. IMPRESSION: No active cardiopulmonary disease. Electronically Signed   By: Inez Catalina M.D.   On: 02/23/2017 11:05    Procedures Procedures (including critical care time)  Medications Ordered in ED Medications  nitroGLYCERIN (NITROSTAT) SL tablet 0.4 mg ( Sublingual MAR Hold 02/24/17 1015)  sodium chloride flush (NS) 0.9 % injection 3 mL ( Intravenous Automatically Held 03/11/17 2200)  sodium chloride flush (NS) 0.9 % injection 3 mL ( Intravenous MAR Hold 02/24/17 1015)  0.9 %  sodium chloride infusion ( Intravenous MAR Hold 02/24/17 1015)  acetaminophen (TYLENOL) tablet 650 mg ( Oral MAR Hold 02/24/17 1015)  ondansetron (ZOFRAN) injection 4 mg ( Intravenous MAR Hold 02/24/17 1015)  nitroGLYCERIN 50 mg in dextrose 5 % 250 mL (0.2 mg/mL) infusion ( Intravenous MAR Hold 02/24/17 1015)  atorvastatin (LIPITOR) tablet 80 mg ( Oral Automatically Held 03/11/17 1800)  amLODipine (NORVASC) tablet 2.5 mg ( Oral Automatically Held 03/11/17  1000)  aspirin EC tablet 81 mg ( Oral Automatically Held 03/04/17 1000)  doxycycline (VIBRA-TABS) tablet 100 mg ( Oral Automatically Held 03/04/17 2200)  ferrous sulfate tablet 325 mg ( Oral Automatically Held 03/04/17 1700)  carvedilol (COREG) tablet 25 mg ( Oral Automatically Held 03/11/17 1700)  clopidogrel (PLAVIX) tablet 75 mg ( Oral Automatically Held 03/11/17 1500)  latanoprost (XALATAN) 0.005 % ophthalmic solution 1 drop ( Both Eyes Automatically Held 03/11/17 1000)  levothyroxine (SYNTHROID, LEVOTHROID) tablet 125 mcg ( Oral Automatically Held 03/04/17 0800)  pantoprazole (PROTONIX) EC tablet 40 mg ( Oral Automatically Held 03/11/17 2200)  tamsulosin (FLOMAX) capsule 0.4 mg ( Oral Automatically Held 03/11/17 1000)  heparin ADULT infusion 100 units/mL (25000 units/210mL sodium chloride 0.45%) (0 Units/hr Intravenous Stopped 02/24/17 0958)  ranolazine (RANEXA) 12 hr tablet 1,000 mg ( Oral Automatically Held 03/11/17 2200)  lubiprostone (AMITIZA) capsule 24 mcg ( Oral MAR Hold 02/24/17 1015)  sodium chloride flush (NS) 0.9 % injection 3 mL (not administered)  sodium chloride flush (NS) 0.9 % injection 3 mL (not administered)  0.9 %  sodium chloride infusion (not administered)  aspirin chewable tablet 81 mg (not administered)  0.9% sodium chloride infusion (not administered)    Followed by  0.9% sodium chloride infusion (not administered)  aspirin tablet 325 mg (325 mg Oral Given 02/23/17 1142)  heparin bolus via infusion 4,000 Units (4,000 Units Intravenous Bolus from Bag 02/23/17 1521)     Initial Impression /  Assessment and Plan / ED Course  I have reviewed the triage vital signs and the nursing notes.  Pertinent labs & imaging results that were available during my care of the patient were reviewed by me and considered in my medical decision making (see chart for details).    1:12 PM d/w cardiology- they will see patient in the ED.  He has notified us a few minutes ago of an episode of chest pain-  another ekg ordered and nitro SL ordered as well for active chest pain.    Cardiology has seen patient and plans to admit for further evaluation and management.    Final Clinical Impressions(s) / ED Diagnoses   Final diagnoses:  Unstable angina [facility])    New Prescriptions Current Discharge Medication List       Alfonzo Beers, MD 02/24/17 1721

## 2017-02-23 NOTE — Progress Notes (Signed)
Received [name] into 5L31 from ED, accompanied by RN and patient's daughter. Patient stable on arrival with NTG and heparin drips running. Report concluded with nurse at bedside. Patient denies chest pain at this time. Assessment in progress.

## 2017-02-23 NOTE — Progress Notes (Signed)
ANTICOAGULATION CONSULT NOTE - Initial Consult  Pharmacy Consult for Heparin Indication: chest pain/ACS  No Known Allergies  Patient Measurements: Height: 5\' 6"  (167.6 cm) Weight: 167 lb 15.9 oz (76.2 kg) IBW/kg (Calculated) : 63.8  Vital Signs: Temp: 98.8 F (37.1 C) (04/17 1040) Temp Source: Oral (04/17 1040) BP: 169/82 (04/17 1430) Pulse Rate: 65 (04/17 1430)  Labs:  Recent Labs  02/23/17 1123  HGB 11.4*  HCT 33.5*  PLT 184  CREATININE 1.25*    Estimated Creatinine Clearance: 35.4 mL/min (A) (by C-G formula based on SCr of 1.25 mg/dL (H)).   Medical History: Past Medical History:  Diagnosis Date  . Anemia   . CAD (coronary artery disease)    a. CABG '89; b. PCI '04; c. 11/2015 Cath/PCI: LM 20ost, LAD 152m, LCX 30p, OM2 40, RCA 30p, 10p ISR, 90d (3.0x12 Synergy DES), AM 90, VG->OM2 known to be 100, LIMA->LAD not injected, patent in 2015.  Marland Kitchen Chronic combined systolic and diastolic CHF (congestive heart failure) ([place])   . CKD (chronic kidney disease), stage III   . H/O: GI bleed    REMOTE HISTORY  . History of COPD   . Hyperlipidemia   . Hypertensive heart disease   . LBBB (left bundle branch block)   . Pacemaker Sept 2009   St Jude  . PAF (paroxysmal atrial fibrillation) (HCC)    a. not on anticoag due to prior GIB.  . Sick sinus syndrome [facility]) Sept 2009   ST Jude PTVDP   Assessment: 89yom to begin heparin for unstable angina. Hx afib but no anticoagulants pta d/t GIB.   Goal of Therapy:  Heparin level 0.3-0.7 units/ml Monitor platelets by anticoagulation protocol: Yes   Plan:  1) Heparin bolus 4000 units x 1 2) Heparin drip at 900 units/hr 3) 8 hour heparin level 4) Daily heparin level and CBC  Deboraha Sprang 02/23/2017,2:59 PM

## 2017-02-23 NOTE — Progress Notes (Signed)
Patient states no BM since Friday or Saturday. States this is normal for him and he uses stool softeners at home. Patient states "I try to have a bowel movement twice a week".

## 2017-02-23 NOTE — ED Notes (Signed)
Called [facility] to have tray taken upstairs to [phone]

## 2017-02-23 NOTE — ED Notes (Signed)
Pt taken to x-ray from waiting room and transporter will bring pt back to room.

## 2017-02-23 NOTE — H&P (Signed)
Cardiology History & Physical    Patient ID: John Peck MRN: [id number], DOB: 11-16-1923 Date of Encounter: 02/23/2017, 1:29 PM Primary Physician: Pcp Not In System Primary Cardiologist: Dr. Shelva Majestic  Chief Complaint: unstable angina Reason for Admission: unstable angina Requesting MD: Dr. Alfonzo Beers, ER   John Peck is a 83 y.o. male who presents to the ER today for progressively worsening chest pain. Hx of PAD, extensive  CAD s/p CABG and subsequent interventions,  iron deficiency anemia, hypertension, BPH, status post TURP in 1992, remote history of liver abscess, history of COPD.  1989  John Peck has  CAD and underwent CABG surgery with LIMA to the LAD, a vein to the circumflex in 1989.   2004:  In 2004 he underwent stenting to his RCA.  2009: history of atrial fibrillation, sick sinus syndrome and status post permanent pacemaker in 2009 which is a St. Jude device  2013: Cardiac catheterization in January 2013 showed an ejection fraction of 50% with mild inferior, distal inferior and apical lateral hypocontractility. He had significant native CAD with 80% stenosis of the LAD after the second diagonal vessel and septal perforator artery with total occlusion of the mid LAD. He was 29 - 70% ostial stenosis in the circumflex vessel followed by 40% proximal stenosis and 70-80% distal stenosis. He had an occluded Y graft which previously sequentially supplied the intermediate and distal circumflex.There was a patent stented proximal RCA with 90% stenosis in the anterior RV marginal branch arising from the mid RCA and had 70-80% stenosis in the distal RCA beyond the crux with 78% stenosis the PDA takeoff. His LIMA to LAD was patent. He has been on medical therapy.  2015 March: In March 2015 due to recurrent increasing symptoms of chest pain he underwent repeat cardiac catheterization which revealed mild LV dysfunction with an EF of 45% inferior wall hypocontractility.  There was significant multivessel native coronary obstructive disease with 50% ostial LAD stenosis followed by 80% proximal stenosis in the region of the first diagonal takeoff followed by 80% stenosis after the second diagonal vessel; total occlusion of the ramus intermediate vessel with evidence for bidirectional retrograde collateralization; 40% ostial left circumflex stenosis; and widely patent proximal RCA stent with 80% stenosis in the anterior RV marginal branch, 40% mid or CVA stenosis, 50% distal, and 50% ostial and mid PDA stenoses. He had a patent LIMA graft supplying the mid LAD and an occluded Y graft which previously supplied the intermediate and distal circumflex.  He has been on increased medical regimen consisting of aspirin, Plavix, carvedilol 25 mg twice a day, high dose oral nitrate therapy, Ranexa 1000 g twice a day.  Amlodipine 10 mg in addition to Crestor 20 mg.  He also is taking Lasix 20 mg with supplemental potassium.  He was seen in the office by Lesia Hausen and admitted for bilateral lower extremity pain and weakness.  His blood pressure was somewhat low and his amlodipine dose was reduced from 10 to 5 mg. Lower extremity Doppler studies showed an ABI of 0.5 on the right and 0.58 on the left.  He had mild dilatation of his distal abdominal aorta.  There was equal to less than 50% diameter reduction.  The right SFA and right popliteal artery and there was a greater than 60% diameter reduction in the left SFA.  However, there was one vessel runoff via the peroneal artery bilaterally.  His symptoms are worse in the right lower extremity due to  his distal disease.  2015 August He did well with his increased medical regimen including Ranexa, high-dose nitrate therapy, amlodipine and carvedilol.  A follow-up Doppler study in August 2015 showed ABI of 0.5 on the right and 0.58 on the left.  He has poor distal runoff.  His Doppler studies were reviewed with Dr. Gwenlyn Found who agreed with  medical therapy.  He already was on aspirin and Plavix  2017 January Hospitalized January 2017 with chest pain and shortness of breath.  He ruled out for an MI.  He was treated for COPD exacerbation.  He underwent repeat cardiac catheterization by Dr. Julianne Handler.  He was found to have an occluded mid LAD with presumptive filling of the distal LAD by the LIMA graft, although during this catheterization.  He was unable to selectively engage the LIMA graft.  He had mild proximal disease in the circumflex vessel.  There was a widely patent stent in the RCA, but he developed a new distal RCA stenosis for which he underwent successful intervention.  He also had 90%.  RV marginal stenosis.     ED Course: Comes to the ER from home for chest pain for the past month that has been progressively getting worse. He has been requiring more nitro tablets than normal which have improved the pain. He also is noticing his symptoms to be worse with exertion and now is only mildly mobile. Today he came to the ER and was not having pain at the time but did have an episode of pain that lasted 5 minutes while here. His wife says he complains of pain all different times of the day the past few weeks and more often with exertion and relieves with rest. + associated SOB and weakness.   He is currently pain free. ER evaluation revealed troponin negative, BNP 530, Creatinine 1.25, chest xray normal.   Past Medical History:  Diagnosis Date  . Anemia   . CAD (coronary artery disease)    a. CABG '89; b. PCI '04; c. 11/2015 Cath/PCI: LM 20ost, LAD 148m, LCX 30p, OM2 40, RCA 30p, 10p ISR, 90d (3.0x12 Synergy DES), AM 90, VG->OM2 known to be 100, LIMA->LAD not injected, patent in 2015.  Marland Kitchen Chronic combined systolic and diastolic CHF (congestive heart failure) ([place])   . CKD (chronic kidney disease), stage III   . H/O: GI bleed    REMOTE HISTORY  . History of COPD   . Hyperlipidemia   . Hypertensive heart disease   . LBBB (left  bundle branch block)   . Pacemaker Sept 2009   St Jude  . PAF (paroxysmal atrial fibrillation) (HCC)    a. not on anticoag due to prior GIB.  . Sick sinus syndrome [facility]) Sept 2009   ST Jude PTVDP     Surgical History:  Past Surgical History:  Procedure Laterality Date  . CARDIAC CATHETERIZATION  11/2011   EF 50%; significant native CAD w/80% stenosis of the LAD after 2nd giagonal vessel and septal perforating artery w/total occlusion of the mid left anterior descendiung.; 60-70% ostiasl stenosis in the circumflex vessel followed by 40% proximal stenosis and 70-80% distal circumflex stenosis;   . CARDIAC CATHETERIZATION N/A 11/29/2015   Procedure: Left Heart Cath and Cors/Grafts Angiography;  Surgeon: Burnell Blanks, MD;  Location: [place] [facility];  Service: Cardiovascular;  Laterality: N/A;  . CARDIAC CATHETERIZATION N/A 11/29/2015   Procedure: Coronary Stent Intervention;  Surgeon: Burnell Blanks, MD;  Location: [place] [facility];  Service: Cardiovascular;  Laterality: N/A;  . CATARACT EXTRACTION  2004  . CORONARY ANGIOPLASTY WITH STENT PLACEMENT  2004   RCA  . CORONARY ARTERY BYPASS GRAFT  1989   had LIMA to his LAD, a vein to the circumflex. In 2004 underwent stenting to his right coronary artery.  Marland Kitchen HEMORRHOID SURGERY    . INSERT / REPLACE / REMOVE PACEMAKER  07/17/08   DUAL-CHAMBER; PPM-ST.JUDE MEDNET  . LEFT HEART CATHETERIZATION WITH CORONARY/GRAFT ANGIOGRAM N/A 12/10/2011   Procedure: LEFT HEART CATHETERIZATION WITH Beatrix Fetters;  Surgeon: Troy Sine, MD;  Location: [facility] LAB;  Service: Cardiovascular;  Laterality: N/A;  . LEFT HEART CATHETERIZATION WITH CORONARY/GRAFT ANGIOGRAM N/A 01/26/2014   Procedure: LEFT HEART CATHETERIZATION WITH Beatrix Fetters;  Surgeon: Troy Sine, MD;  Location: [facility] CATH LAB;  Service: Cardiovascular;  Laterality: N/A;  . PROSTATECTOMY       Home Meds: Prior to Admission medications   Medication Sig  Start Date End Date Taking? Authorizing Provider  amLODipine (NORVASC) 2.5 MG tablet Take 2.5 mg by mouth daily. 10/31/16  Yes Historical Provider, MD  aspirin 81 MG tablet Take 81 mg by mouth daily.    Yes Historical Provider, MD  atorvastatin (LIPITOR) 40 MG tablet Take 1 tablet (40 mg total) by mouth daily. 09/18/15  Yes Troy Sine, MD  carvedilol (COREG) 25 MG tablet Take 25 mg by mouth 2 (two) times daily with a meal.     Yes Historical Provider, MD  clopidogrel (PLAVIX) 75 MG tablet Take 75 mg by mouth daily at 3 pm.    Yes Historical Provider, MD  doxycycline (VIBRA-TABS) 100 MG tablet Take 100 mg by mouth 2 (two) times daily. Started on 02-19-17 for 7 days 02/19/17  Yes Historical Provider, MD  ferrous sulfate 325 (65 FE) MG tablet Take 325 mg by mouth 3 (three) times daily with meals.    Yes Historical Provider, MD  furosemide (LASIX) 20 MG tablet Take 20 mg by mouth daily.   Yes Historical Provider, MD  isosorbide mononitrate (IMDUR) 60 MG 24 hr tablet TAKE 2 TABLETS BY MOUTH EVERY MORNING AND 1 TABLET IN THE EVENING Patient taking differently: Take 30-60 mg by mouth 2 (two) times daily.  09/16/15  Yes Troy Sine, MD  latanoprost (XALATAN) 0.005 % ophthalmic solution Place 1 drop into both eyes daily. 11/07/15  Yes Historical Provider, MD  levothyroxine (SYNTHROID, LEVOTHROID) 125 MCG tablet TAKE 1 TABLET BY MOUTH EVERY DAY BEFORE BREAKFAST 02/01/17  Yes Troy Sine, MD  lubiprostone (AMITIZA) 24 MCG capsule Take 24 mcg by mouth daily as needed. For bowel movement   Yes Historical Provider, MD  Menthol, Topical Analgesic, (ICY HOT EX) Apply 1 application topically daily as needed (aches and pains).   Yes Historical Provider, MD  pantoprazole (PROTONIX) 40 MG tablet Take 40 mg by mouth 2 (two) times daily.   Yes Historical Provider, MD  ranolazine (RANEXA) 1000 MG SR tablet Take 1 tablet (1,000 mg total) by mouth 2 (two) times daily. 11/27/15  Yes Troy Sine, MD  tamsulosin  (FLOMAX) 0.4 MG CAPS capsule Take 0.4 mg by mouth daily. 02/26/15  Yes Historical Provider, MD  nitroGLYCERIN (NITROSTAT) 0.4 MG SL tablet PLACE 1 TABLET UNDER THE TONGUE AS NEEDED. 07/06/16   Troy Sine, MD    Allergies: No Known Allergies  Social History   Social History  . Marital status: Married    Spouse name: N/A  . Number of children: 5  . Years  of education: N/A   Occupational History  . RETIRED Retired    Banker DRIVER   Social History Main Topics  . Smoking status: Former Smoker    Packs/day: 2.00    Years: 65.00    Types: Cigarettes  . Smokeless tobacco: Former Systems developer    Quit date: 10/29/2008  . Alcohol use No  . Drug use: No  . Sexual activity: Not on file   Other Topics Concern  . Not on file   Social History Narrative  . No narrative on file     Family History  Problem Relation Age of Onset  . Diabetes Father   . Heart disease Sister   . Heart disease Brother   . Heart attack Brother   . Stroke Brother   . Hypertension Neg Hx     Review of Systems: General: negative for chills, fever, night sweats or weight changes.  Cardiovascular: negative for palpitations, Dermatological: negative for rash Respiratory: negative for cough or wheezing Urologic: negative for hematuria Abdominal: negative for nausea, vomiting, diarrhea, bright red blood per rectum, melena, or hematemesis Neurologic: negative for visual changes, syncope, or dizziness All other systems reviewed and are otherwise negative except as noted above.  Labs:  Lab Results  Component Value Date   WBC 4.7 02/23/2017   HGB 11.4 (L) 02/23/2017   HCT 33.5 (L) 02/23/2017   MCV 93.1 02/23/2017   PLT 184 02/23/2017    Recent Labs Lab 02/23/17 1123  NA 138  K 3.6  CL 104  CO2 26  BUN 18  CREATININE 1.25*  CALCIUM 8.9  GLUCOSE 101*    Lab Results  Component Value Date   CHOL 197 01/14/2017   HDL 48 01/14/2017   LDLCALC 124 (H) 01/14/2017   TRIG 125 01/14/2017   No results  found for: DDIMER  Radiology/Studies:   Dg Chest 2 View Result Date: 02/23/2017  No active cardiopulmonary disease.   Dg Chest 2 View Result Date: 02/12/2017 1. Cardiac pacer noted with lead tips in the right atrium and right ventricle. Prior CABG. Heart size normal. 2. Left base pleuroparenchymal thickening consistent with scarring.  Wt Readings from Last 3 Encounters:  01/12/17 168 lb (76.2 kg)  12/25/16 166 lb 12.8 oz (75.7 kg)  10/12/16 166 lb 3.2 oz (75.4 kg)    EKG: paced rhythm, HR 66   - independently reviewed  Physical Exam: Blood pressure (!) 157/76, pulse 66, temperature 98.8 F (37.1 C), temperature source Oral, resp. rate 14, SpO2 98 %. There is no height or weight on file to calculate BMI. General: Alert, oriented, no distress.  Head: Normocephalic, atraumatic, sclera non-icteric, no xanthomas, nares are without discharge.  Neck: Negative for carotid bruits. JVD not elevated. Lungs: Clear bilaterally to auscultation without wheezes, rales, or rhonchi. Breathing is unlabored. Heart: RRR with S1 S2. + systolic  murmur,  Abdomen: Soft, non-tender, non-distended with normoactive bowel sounds. No hepatomegaly. No rebound/guarding. No obvious abdominal masses. Msk:  Strength and tone appear normal for age. Extremities: No clubbing or cyanosis. + edema.  Distal pedal pulses are 2+ and equal bilaterally. Neuro: Alert and oriented X 3. No focal deficit. No facial asymmetry. Moves all extremities spontaneously. Psych:  Responds to questions appropriately with a normal affect.    Assessment and Plan    Unstable angina with CAD in native artery: Lengthy and complex cardiac history. Progressively worsening angina -- start IV nitro drip and IV Heparin drip, Home Imdur witheld -- patient admitted to Telemetry Obs,  anticipate that he will require cardiac catheterization during this admission, but attempting conservative measures initially and cycling troponin's. Following  closely -- On Coreg, Statin, Plavix, Aspirin and Ranexa  Essential hypertension: Moderate control on home regimen, 159/60 to 157/76, hopefully this will improve with Nitro drip.  Paroxysmal atrial fibrillation: Paced rhythm, currently in sinus rhythm. CHADVasc score of 4 (age, hypertension, vascular disorder) Not anticoagulated due to prior GI bleed, per chart review  Hyperlipidemia: LDL 124, on 40 mg Lipitor, increased to 80mg   CKD Stage III: Creatinine is 1.25 today which is his baseline. Home lasix held given possibility for cath  Hypothyroidism: March 2018 TSH  0.44  Symptomatic bradycardia: s/p pacemaker, followed by Dr. Lovena Peck  PAD: being managed medically  Signed, Linus Mako PA-C 02/23/2017, 1:29 PM    Patient seen and examined. Agree with assessment and plan.  John Peck is a 83 year old African-American gentleman who is well-known to me.  He has a history of CAD, PVD, PAF, not on anticoagulation prior to GI bleed.  He underwent CABG revascularization surgery in 1989, and has history of sick sinus syndrome and underwent permanent pacemaker insertion in 2009.  He has undergone  multiple repeat cardiac catheterizations with his last catheterization in January 2017.  This revealed an occluded mid LAD with presumptive filling of his distal LAD by the LIMA graft, although at catheterization.  The labral was not able to be selectively engaged.  He had proximal disease in the circumflex vessel.  He had a widely patent stent in the RCA, but had developed new distal RCA stenoses for which he underwent intervention.  He has been on a medical regimen that included amlodipine, carvedilol, high-dose nitrates, and Ranexa.  He is now admitted with a several week history of progressive recurrent chest pain.  He believes his symptoms are similar to the chest pain that he had prior to his interventions.  He also has noticed some occasional chest fluttering.  He states he has experienced  these chest pains almost every day for the past 2 weeks.  With his accelerated anginal symptomatology, will treat as unstable angina.  His renal function is at baseline.  He is 29 years status post CBG revascularization surgery.  I have recommended definitive repeat cardiac catheterization.  I will try to schedule this to be done tomorrow.  Lipid studies are elevated despite taking atorvastatin 40 mg; will titrate to 80 mg and he may also require adjunctive Zetia for more optimal therapy.  He has a history of iron deficient anemia followed by Dr. Benay Spice.    Troy Sine, MD, [facility] 02/23/2017 6:13 PM

## 2017-02-23 NOTE — ED Notes (Signed)
Heart healthy/carb modified tray ordered 

## 2017-02-24 ENCOUNTER — Encounter (HOSPITAL_COMMUNITY)
Admission: EM | Disposition: A | Discharge status: Home Health | Payer: Self-pay | Source: Home / Self Care | Attending: Cardiovascular Disease

## 2017-02-24 DIAGNOSIS — I480 Paroxysmal atrial fibrillation: Secondary | ICD-10-CM | POA: Diagnosis not present

## 2017-02-24 DIAGNOSIS — I739 Peripheral vascular disease, unspecified: Secondary | ICD-10-CM | POA: Diagnosis present

## 2017-02-24 DIAGNOSIS — I255 Ischemic cardiomyopathy: Secondary | ICD-10-CM | POA: Diagnosis not present

## 2017-02-24 DIAGNOSIS — K5900 Constipation, unspecified: Secondary | ICD-10-CM | POA: Diagnosis not present

## 2017-02-24 DIAGNOSIS — J449 Chronic obstructive pulmonary disease, unspecified: Secondary | ICD-10-CM | POA: Diagnosis present

## 2017-02-24 DIAGNOSIS — E039 Hypothyroidism, unspecified: Secondary | ICD-10-CM | POA: Diagnosis present

## 2017-02-24 DIAGNOSIS — I25810 Atherosclerosis of coronary artery bypass graft(s) without angina pectoris: Secondary | ICD-10-CM | POA: Diagnosis not present

## 2017-02-24 DIAGNOSIS — I5042 Chronic combined systolic (congestive) and diastolic (congestive) heart failure: Secondary | ICD-10-CM | POA: Diagnosis not present

## 2017-02-24 DIAGNOSIS — I130 Hypertensive heart and chronic kidney disease with heart failure and stage 1 through stage 4 chronic kidney disease, or unspecified chronic kidney disease: Secondary | ICD-10-CM | POA: Diagnosis not present

## 2017-02-24 DIAGNOSIS — I495 Sick sinus syndrome: Secondary | ICD-10-CM | POA: Diagnosis not present

## 2017-02-24 DIAGNOSIS — I471 Supraventricular tachycardia: Secondary | ICD-10-CM | POA: Diagnosis not present

## 2017-02-24 DIAGNOSIS — I214 Non-ST elevation (NSTEMI) myocardial infarction: Secondary | ICD-10-CM | POA: Diagnosis not present

## 2017-02-24 DIAGNOSIS — Z950 Presence of cardiac pacemaker: Secondary | ICD-10-CM

## 2017-02-24 DIAGNOSIS — I25110 Atherosclerotic heart disease of native coronary artery with unstable angina pectoris: Secondary | ICD-10-CM | POA: Diagnosis not present

## 2017-02-24 DIAGNOSIS — Z951 Presence of aortocoronary bypass graft: Secondary | ICD-10-CM | POA: Diagnosis not present

## 2017-02-24 DIAGNOSIS — Z87891 Personal history of nicotine dependence: Secondary | ICD-10-CM | POA: Diagnosis not present

## 2017-02-24 DIAGNOSIS — N183 Chronic kidney disease, stage 3 (moderate): Secondary | ICD-10-CM | POA: Diagnosis not present

## 2017-02-24 DIAGNOSIS — Z7902 Long term (current) use of antithrombotics/antiplatelets: Secondary | ICD-10-CM | POA: Diagnosis not present

## 2017-02-24 DIAGNOSIS — Z7982 Long term (current) use of aspirin: Secondary | ICD-10-CM | POA: Diagnosis not present

## 2017-02-24 DIAGNOSIS — E785 Hyperlipidemia, unspecified: Secondary | ICD-10-CM | POA: Diagnosis not present

## 2017-02-24 DIAGNOSIS — Z79899 Other long term (current) drug therapy: Secondary | ICD-10-CM | POA: Diagnosis not present

## 2017-02-24 DIAGNOSIS — I200 Unstable angina: Secondary | ICD-10-CM | POA: Diagnosis not present

## 2017-02-24 DIAGNOSIS — N400 Enlarged prostate without lower urinary tract symptoms: Secondary | ICD-10-CM | POA: Diagnosis present

## 2017-02-24 HISTORY — PX: LEFT HEART CATH AND CORS/GRAFTS ANGIOGRAPHY: CATH118250

## 2017-02-24 HISTORY — PX: CORONARY STENT INTERVENTION: CATH118234

## 2017-02-24 LAB — LIPID PANEL
CHOL/HDL RATIO: 4.3 ratio
CHOLESTEROL: 224 mg/dL — AB (ref 0–200)
HDL: 52 mg/dL (ref >40)
LDL Cholesterol: 160 mg/dL — ABNORMAL HIGH (ref 0–99)
Triglycerides: 58 mg/dL (ref <150)
VLDL: 12 mg/dL (ref 0–40)

## 2017-02-24 LAB — TROPONIN I: TROPONIN I: 0.03 ng/mL — AB (ref <0.03)

## 2017-02-24 LAB — BASIC METABOLIC PANEL
Anion gap: 8 (ref 5–15)
BUN: 17 mg/dL (ref 6–20)
CHLORIDE: 104 mmol/L (ref 101–111)
CO2: 27 mmol/L (ref 22–32)
CREATININE: 1.14 mg/dL (ref 0.61–1.24)
Calcium: 8.9 mg/dL (ref 8.9–10.3)
GFR calc non Af Amer: 55 mL/min — ABNORMAL LOW (ref >60)
Glucose, Bld: 93 mg/dL (ref 65–99)
Potassium: 3.5 mmol/L (ref 3.5–5.1)
Sodium: 139 mmol/L (ref 135–145)

## 2017-02-24 LAB — HEPARIN LEVEL (UNFRACTIONATED): HEPARIN UNFRACTIONATED: 0.57 [IU]/mL (ref 0.30–0.70)

## 2017-02-24 LAB — CBC
HCT: 34.5 % — ABNORMAL LOW (ref 39.0–52.0)
HEMOGLOBIN: 11.8 g/dL — AB (ref 13.0–17.0)
MCH: 31.6 pg (ref 26.0–34.0)
MCHC: 34.2 g/dL (ref 30.0–36.0)
MCV: 92.2 fL (ref 78.0–100.0)
Platelets: 178 10*3/uL (ref 150–400)
RBC: 3.74 MIL/uL — AB (ref 4.22–5.81)
RDW: 13.7 % (ref 11.5–15.5)
WBC: 5.6 10*3/uL (ref 4.0–10.5)

## 2017-02-24 LAB — POCT ACTIVATED CLOTTING TIME: ACTIVATED CLOTTING TIME: 301 s

## 2017-02-24 SURGERY — LEFT HEART CATH AND CORS/GRAFTS ANGIOGRAPHY
Anesthesia: LOCAL

## 2017-02-24 MED ORDER — SODIUM CHLORIDE 0.9 % IV SOLN: 250.0000 mL | INTRAVENOUS | Status: DC | PRN

## 2017-02-24 MED ORDER — FENTANYL CITRATE (PF) 100 MCG/2ML IJ SOLN
INTRAMUSCULAR | Status: AC
  Filled 2017-02-24: qty 2

## 2017-02-24 MED ORDER — IOPAMIDOL (ISOVUE-370) INJECTION 76%
INTRAVENOUS | Status: AC
  Filled 2017-02-24: qty 100

## 2017-02-24 MED ORDER — SODIUM CHLORIDE 0.9% FLUSH: 3.0000 mL | INTRAVENOUS | Status: DC | PRN

## 2017-02-24 MED ORDER — SODIUM CHLORIDE 0.9% FLUSH: 3.0000 mL | Freq: Two times a day (BID) | INTRAVENOUS | Status: DC

## 2017-02-24 MED ORDER — LABETALOL HCL 5 MG/ML IV SOLN
INTRAVENOUS | Status: AC
  Filled 2017-02-24: qty 4

## 2017-02-24 MED ORDER — VERAPAMIL HCL 2.5 MG/ML IV SOLN
INTRAVENOUS | Status: AC
  Filled 2017-02-24: qty 2

## 2017-02-24 MED ORDER — LABETALOL HCL 5 MG/ML IV SOLN
20.0000 mg | Freq: Once | INTRAVENOUS | Status: AC
  Administered 2017-02-24: 20 mg via INTRAVENOUS

## 2017-02-24 MED ORDER — FENTANYL CITRATE (PF) 100 MCG/2ML IJ SOLN
25.0000 ug | Freq: Once | INTRAMUSCULAR | Status: AC
  Administered 2017-02-24: 25 ug via INTRAVENOUS

## 2017-02-24 MED ORDER — IPRATROPIUM-ALBUTEROL 0.5-2.5 (3) MG/3ML IN SOLN: 3.0000 mL | Freq: Four times a day (QID) | RESPIRATORY_TRACT | Status: DC

## 2017-02-24 MED ORDER — ENOXAPARIN SODIUM 40 MG/0.4ML ~~LOC~~ SOLN
40.0000 mg | SUBCUTANEOUS | Status: DC
  Administered 2017-02-25: 09:00:00 40 mg via SUBCUTANEOUS
  Filled 2017-02-24: qty 0.4

## 2017-02-24 MED ORDER — HEPARIN (PORCINE) IN NACL 2-0.9 UNIT/ML-% IJ SOLN
INTRAMUSCULAR | Status: DC | PRN
  Administered 2017-02-24: 1000 mL

## 2017-02-24 MED ORDER — IOPAMIDOL (ISOVUE-370) INJECTION 76%
INTRAVENOUS | Status: DC | PRN
  Administered 2017-02-24: 150 mL via INTRA_ARTERIAL

## 2017-02-24 MED ORDER — HEPARIN SODIUM (PORCINE) 1000 UNIT/ML IJ SOLN
INTRAMUSCULAR | Status: DC | PRN
  Administered 2017-02-24 (×2): 4000 [IU] via INTRAVENOUS

## 2017-02-24 MED ORDER — VERAPAMIL HCL 2.5 MG/ML IV SOLN
INTRAVENOUS | Status: DC | PRN
  Administered 2017-02-24: 10 mL via INTRA_ARTERIAL

## 2017-02-24 MED ORDER — SODIUM CHLORIDE 0.9 % IV SOLN
250.0000 mL | INTRAVENOUS | Status: DC | PRN
Start: 2017-02-24 — End: 2017-02-26

## 2017-02-24 MED ORDER — IPRATROPIUM-ALBUTEROL 0.5-2.5 (3) MG/3ML IN SOLN
3.0000 mL | RESPIRATORY_TRACT | Status: DC | PRN
  Administered 2017-02-24 – 2017-02-26 (×2): 3 mL via RESPIRATORY_TRACT
  Filled 2017-02-24 (×2): qty 3

## 2017-02-24 MED ORDER — ANGIOPLASTY BOOK
Freq: Once | Status: AC
  Administered 2017-02-24: 21:00:00
  Filled 2017-02-24: qty 1

## 2017-02-24 MED ORDER — SODIUM CHLORIDE 0.9 % WEIGHT BASED INFUSION: 3.0000 mL/kg/h | INTRAVENOUS | Status: DC

## 2017-02-24 MED ORDER — CLOPIDOGREL BISULFATE 300 MG PO TABS
ORAL_TABLET | ORAL | Status: AC
  Filled 2017-02-24: qty 1

## 2017-02-24 MED ORDER — DILTIAZEM HCL ER COATED BEADS 120 MG PO CP24
120.0000 mg | ORAL_CAPSULE | Freq: Every day | ORAL | Status: DC
  Administered 2017-02-24 – 2017-02-25 (×2): 120 mg via ORAL
  Filled 2017-02-24 (×4): qty 1

## 2017-02-24 MED ORDER — IOPAMIDOL (ISOVUE-370) INJECTION 76%
INTRAVENOUS | Status: AC
  Filled 2017-02-24: qty 125

## 2017-02-24 MED ORDER — LIDOCAINE HCL 1 % IJ SOLN
INTRAMUSCULAR | Status: AC
  Filled 2017-02-24: qty 20

## 2017-02-24 MED ORDER — SODIUM CHLORIDE 0.9 % IV SOLN: INTRAVENOUS | Status: AC

## 2017-02-24 MED ORDER — ASPIRIN 81 MG PO CHEW: 81.0000 mg | CHEWABLE_TABLET | ORAL | Status: DC

## 2017-02-24 MED ORDER — SODIUM CHLORIDE 0.9 % WEIGHT BASED INFUSION: 1.0000 mL/kg/h | INTRAVENOUS | Status: DC

## 2017-02-24 MED ORDER — AMLODIPINE BESYLATE 5 MG PO TABS
ORAL_TABLET | ORAL | Status: AC
  Filled 2017-02-24: qty 1

## 2017-02-24 MED ORDER — HEPARIN SODIUM (PORCINE) 1000 UNIT/ML IJ SOLN
INTRAMUSCULAR | Status: AC
  Filled 2017-02-24: qty 1

## 2017-02-24 MED ORDER — HEPARIN (PORCINE) IN NACL 2-0.9 UNIT/ML-% IJ SOLN
INTRAMUSCULAR | Status: AC
  Filled 2017-02-24: qty 1000

## 2017-02-24 MED ORDER — SODIUM CHLORIDE 0.9% FLUSH
3.0000 mL | Freq: Two times a day (BID) | INTRAVENOUS | Status: DC
  Administered 2017-02-24: 3 mL via INTRAVENOUS

## 2017-02-24 MED ORDER — MORPHINE SULFATE (PF) 2 MG/ML IV SOLN
2.0000 mg | Freq: Once | INTRAVENOUS | Status: AC
  Administered 2017-02-24: 2 mg via INTRAVENOUS
  Filled 2017-02-24: qty 1

## 2017-02-24 MED ORDER — NITROGLYCERIN 1 MG/10 ML FOR IR/CATH LAB
INTRA_ARTERIAL | Status: AC
  Filled 2017-02-24: qty 10

## 2017-02-24 MED ORDER — CLOPIDOGREL BISULFATE 300 MG PO TABS
ORAL_TABLET | ORAL | Status: DC | PRN
  Administered 2017-02-24: 300 mg via ORAL

## 2017-02-24 SURGICAL SUPPLY — 18 items
BALLN ANGIOSCULPT RX 2.5X15 (BALLOONS) ×2
BALLN ~~LOC~~ MOZEC 2.75X15 (BALLOONS) ×2
BALLOON ANGIOSCULPT RX 2.5X15 (BALLOONS) IMPLANT
BALLOON ~~LOC~~ MOZEC 2.75X15 (BALLOONS) IMPLANT
CATH OPTITORQUE TIG 4.0 5F (CATHETERS) ×1 IMPLANT
CATH VISTA GUIDE 6FR XB3.5 (CATHETERS) ×1 IMPLANT
DEVICE RAD COMP TR BAND LRG (VASCULAR PRODUCTS) ×1 IMPLANT
GLIDESHEATH SLEND SS 6F .021 (SHEATH) ×1 IMPLANT
GUIDEWIRE INQWIRE 1.5J.035X260 (WIRE) IMPLANT
INQWIRE 1.5J .035X260CM (WIRE) ×2
KIT ENCORE 26 ADVANTAGE (KITS) ×1 IMPLANT
KIT HEART LEFT (KITS) ×2 IMPLANT
PACK CARDIAC CATHETERIZATION (CUSTOM PROCEDURE TRAY) ×2 IMPLANT
STENT RESOLUTE ONYX 2.5X18 (Permanent Stent) ×1 IMPLANT
TRANSDUCER W/STOPCOCK (MISCELLANEOUS) ×2 IMPLANT
TUBING CIL FLEX 10 FLL-RA (TUBING) ×2 IMPLANT
WIRE HITORQ VERSACORE ST 145CM (WIRE) ×1 IMPLANT
WIRE RUNTHROUGH .014X180CM (WIRE) ×1 IMPLANT

## 2017-02-24 NOTE — Progress Notes (Signed)
Cath lab RN notified pt did not receive AM meds due to leaving for cath earlier than expected; RN verbalized being aware.

## 2017-02-24 NOTE — Interval H&P Note (Signed)
Cath Lab Visit (complete for each Cath Lab visit)  Clinical Evaluation Leading to the Procedure:   ACS: Yes.    Non-ACS:   n/a      History and Physical Interval Note:  02/24/2017 10:26 AM  [name]  has presented today for surgery, with the diagnosis of unstable angina  The various methods of treatment have been discussed with the patient and family. After consideration of risks, benefits and other options for treatment, the patient has consented to  Procedure(s): Left Heart Cath and Cors/Grafts Angiography (N/A) as a surgical intervention .  The patient's history has been reviewed, patient examined, no change in status, stable for surgery.  I have reviewed the patient's chart and labs.  Questions were answered to the patient's satisfaction.     John Peck

## 2017-02-24 NOTE — Progress Notes (Signed)
Call received from cath lab stating pt moved up on schedule & transport in route.  Pt notified; informed consent obtained.  Pt's wife called and notified MD will call once procedure completed.  Expiratory wheezing worsened since initial assessment; no PRN breathing treatments ordered.  Cath lab supervisor notified and stated Dr. Fletcher Anon would address & order tx to be given in cath lab prior to procedure.  Heparin gtt stopped @ approx 9:58am as directed (pre-cath orders).   Pt continues to deny any CP or discomfort.  Pt denies any questions or concerns.  Transported to cath lab holding via bed with this RN present.

## 2017-02-24 NOTE — Progress Notes (Signed)
Progress Note  Patient Name: John Peck Date of Encounter: 02/24/2017  Primary Cardiologist: [place] better; had PCI to native LCx today.  Inpatient Medications    Scheduled Meds: . aspirin EC  81 mg Oral Daily  . atorvastatin  80 mg Oral q1800  . carvedilol  25 mg Oral BID WC  . clopidogrel  75 mg Oral Q1500  . diltiazem  120 mg Oral Daily  . doxycycline  100 mg Oral BID  . [START ON 02/25/2017] enoxaparin (LOVENOX) injection  40 mg Subcutaneous Q24H  . ferrous sulfate  325 mg Oral TID WC  . latanoprost  1 drop Both Eyes Daily  . levothyroxine  125 mcg Oral QAC breakfast  .  morphine injection  2 mg Intravenous Once  . pantoprazole  40 mg Oral BID  . ranolazine  1,000 mg Oral BID  . sodium chloride flush  3 mL Intravenous Q12H  . sodium chloride flush  3 mL Intravenous Q12H  . tamsulosin  0.4 mg Oral Daily   Continuous Infusions: . sodium chloride    . sodium chloride 75 mL/hr at 02/24/17 1500  . sodium chloride    . nitroGLYCERIN 30 mcg/min (02/24/17 1500)   PRN Meds: sodium chloride, sodium chloride, acetaminophen, lubiprostone, nitroGLYCERIN, ondansetron (ZOFRAN) IV, sodium chloride flush, sodium chloride flush   Vital Signs    Vitals:   02/24/17 1600 02/24/17 1615 02/24/17 1630 02/24/17 1645  BP: (!) 143/83 139/82 (!) 154/98 134/83  Pulse: 67 65 (!) 59 62  Resp: 20 17 19 19   Temp: 97.8 F (36.6 C)     TempSrc: Oral     SpO2: 98% 97% 98% 97%  Weight:      Height:        Intake/Output Summary (Last 24 hours) at 02/24/17 1746 Last data filed at 02/24/17 1500  Gross per 24 hour  Intake           724.58 ml  Output             2450 ml  Net         -1725.42 ml    I/O since admission:   Filed Weights   02/23/17 1430 02/23/17 1830 02/24/17 0439  Weight: 167 lb 15.9 oz (76.2 kg) 162 lb 4.8 oz (73.6 kg) 159 lb 9.6 oz (72.4 kg)    Telemetry    Paced with PACs in the 70s - Personally Reviewed  ECG    ECG (independently read  by me):  Physical Exam   BP 134/83   Pulse 62   Temp 97.8 F (36.6 C) (Oral)   Resp 19   Ht 5\' 6"  (1.676 m)   Wt 159 lb 9.6 oz (72.4 kg)   SpO2 97%   BMI 25.76 kg/m  General: Alert, oriented, no distress.  Skin: normal turgor, no rashes, warm and dry HEENT: Normocephalic, atraumatic. Pupils equal round and reactive to light; sclera anicteric; extraocular muscles intact;  Nose without nasal septal hypertrophy Mouth/Parynx benign; Mallinpatti scale 3 Neck: No JVD, no carotid bruits; normal carotid upstroke Lungs: clear to ausculatation and percussion; no wheezing or rales Chest wall: without tenderness to palpitation Heart: PMI not displaced, RRR, s1 s2 normal, 1/6 systolic murmur, no diastolic murmur, no rubs, gallops, thrills, or heaves Abdomen: soft, nontender; no hepatosplenomehaly, BS+; abdominal aorta nontender and not dilated by palpation. Back: no CVA tenderness Pulses 2+ L radial cath site stable Musculoskeletal: full range of motion, normal strength, no  joint deformities Extremities: no clubbing cyanosis or edema, Homan's sign negative  Neurologic: grossly nonfocal; Cranial nerves grossly wnl Psychologic: Normal mood and affect   Labs    Chemistry Recent Labs Lab 02/23/17 1123 02/23/17 1515 02/24/17 0343  NA 138  --  139  K 3.6  --  3.5  CL 104  --  104  CO2 26  --  27  GLUCOSE 101*  --  93  BUN 18  --  17  CREATININE 1.25*  --  1.14  CALCIUM 8.9  --  8.9  PROT  --  6.7  --   ALBUMIN  --  3.4*  --   AST  --  30  --   ALT  --  14*  --   ALKPHOS  --  82  --   BILITOT  --  1.8*  --   GFRNONAA 49*  --  55*  GFRAA 57*  --  >60  ANIONGAP 8  --  8     Hematology Recent Labs Lab 02/23/17 1123 02/24/17 0343  WBC 4.7 5.6  RBC 3.60* 3.74*  HGB 11.4* 11.8*  HCT 33.5* 34.5*  MCV 93.1 92.2  MCH 31.7 31.6  MCHC 34.0 34.2  RDW 13.7 13.7  PLT 184 178    Cardiac Enzymes Recent Labs Lab 02/23/17 1515 02/23/17 2042 02/24/17 0343  TROPONINI <0.03  <0.03 0.03*    Recent Labs Lab 02/23/17 1134  TROPIPOC 0.00     BNP Recent Labs Lab 02/23/17 1128  BNP 530.4*     DDimer No results for input(s): DDIMER in the last 168 hours.   Lipid Panel     Component Value Date/Time   CHOL 224 (H) 02/24/2017 0343   TRIG 58 02/24/2017 0343   HDL 52 02/24/2017 0343   CHOLHDL 4.3 02/24/2017 0343   VLDL 12 02/24/2017 0343   LDLCALC 160 (H) 02/24/2017 0343   LDLDIRECT 55 12/10/2011 0806    Radiology    Dg Chest 2 View  Result Date: 02/23/2017 CLINICAL DATA:  Substernal chest pain for 1 month EXAM: CHEST  2 VIEW COMPARISON:  02/12/2017 FINDINGS: Cardiac shadow is stable. Postsurgical changes are again seen. Pacing device is again noted and stable. Extrinsic lead is noted over the right upper lobe. No focal infiltrate or sizable effusion is seen. No acute bony abnormality is noted. IMPRESSION: No active cardiopulmonary disease. Electronically Signed   By: Inez Catalina M.D.   On: 02/23/2017 11:05    Cardiac Studies   Cardiac Cath/PCI Conclusion     Ost LM to LM lesion, 20 %stenosed.  2nd Mrg lesion, 40 %stenosed.  Mid LAD lesion, 100 %stenosed.  LIMA.  Origin lesion, 100 %stenosed.  Acute Mrg lesion, 90 %stenosed.  Prox RCA-2 lesion, 10 %stenosed.  Dist RCA lesion, 0 %stenosed.  A drug eluting .  Prox RCA-1 lesion, 40 %stenosed.  Ost LAD lesion, 50 %stenosed.  Mid Cx lesion, 30 %stenosed.  Ost 1st Diag to 1st Diag lesion, 30 %stenosed.  There is mild left ventricular systolic dysfunction.  LV end diastolic pressure is mildly elevated.  The left ventricular ejection fraction is 45-50% by visual estimate.  A STENT RESOLUTE ONYX 2.5X18 drug eluting stent was successfully placed.  Prox Cx lesion, 80 %stenosed.  Post intervention, there is a 0% residual stenosis.   1. Significant native three-vessel coronary artery disease with occluded mid LAD. Patent LIMA to LAD and patent stents in the right coronary artery.  Progression of proximal left circumflex  disease with known occluded SVG graft to OM. 2. Mildly reduced LV systolic function and mildly elevated left ventricular end-diastolic pressure. 3. Successful angioplasty and drug-eluting stent placement to the proximal left circumflex extending all the way back to the ostium.  Recommendations: Dual antiplatelet therapy for at least one year and preferably indefinitely. The patient was noted to have episodes of paroxysmal tachycardia throughout the case likely atrial fibrillation versus atrial tachycardia with a heart rate up to 110 bpm. This might be also responsible for some of his anginal symptoms. I switched amlodipine to diltiazem for better rate control.  Difficult cath via the left radial artery due to left radial loop and left subclavian artery tortuosity.    Conclusion     Ost LM to LM lesion, 20 %stenosed.  2nd Mrg lesion, 40 %stenosed.  Mid LAD lesion, 100 %stenosed.  LIMA.  Origin lesion, 100 %stenosed.  Acute Mrg lesion, 90 %stenosed.  Prox RCA-2 lesion, 10 %stenosed.  Dist RCA lesion, 0 %stenosed.  A drug eluting .  Prox RCA-1 lesion, 40 %stenosed.  Ost LAD lesion, 50 %stenosed.  Mid Cx lesion, 30 %stenosed.  Ost 1st Diag to 1st Diag lesion, 30 %stenosed.  There is mild left ventricular systolic dysfunction.  LV end diastolic pressure is mildly elevated.  The left ventricular ejection fraction is 45-50% by visual estimate.  A STENT RESOLUTE ONYX 2.5X18 drug eluting stent was successfully placed.  Prox Cx lesion, 80 %stenosed.  Post intervention, there is a 0% residual stenosis.   1. Significant native three-vessel coronary artery disease with occluded mid LAD. Patent LIMA to LAD and patent stents in the right coronary artery. Progression of proximal left circumflex disease with known occluded SVG graft to OM. 2. Mildly reduced LV systolic function and mildly elevated left ventricular end-diastolic  pressure. 3. Successful angioplasty and drug-eluting stent placement to the proximal left circumflex extending all the way back to the ostium.  Recommendations: Dual antiplatelet therapy for at least one year and preferably indefinitely. The patient was noted to have episodes of paroxysmal tachycardia throughout the case likely atrial fibrillation versus atrial tachycardia with a heart rate up to 110 bpm. This might be also responsible for some of his anginal symptoms. I switched amlodipine to diltiazem for better rate control.  Difficult cath via the left radial artery due to left radial loop and left subclavian artery tortuosity.        Post-Intervention Diagram       Patient Profile     83 y.o. male who presented to the ER on 02/23/17 for progressively worsening chest pain. Hx of PAD, extensive  CAD s/p CABG and subsequent interventions,  iron deficiency anemia, hypertension, BPH, status post TURP in 1992, remote history of liver abscess, history of COPD.  Assessment & Plan    1. Unstable angina:  s/p DES stent to LCX proximal stenosis extending to the ostium today. I was called in the lab to review the angios when patient was on the cath table and was involved in the decision process to proceed with PCI.  2. PAF:  Currently AV pacing, but had bursts of rapid atrial tachycardia during procedure, suspect these episodes had contributed to his increasing recent chest pain.  On carvedilol 25 mg bid; amlodipine dc'd and change to diltiazem, titrate if BP allows and recurrent symptoms.   3. Hyperlipidemia; now on increased atorvastatin  4. CKD stage 3  Signed, Troy Sine, MD, [facility] 02/24/2017, 5:46 PM

## 2017-02-24 NOTE — Progress Notes (Signed)
TR Band Removal  Location: left radial  Deflated protocol : Yes Time band off/ Dressing applied: 1830 Site upon arrival: Level 0 Site after band removal: Level 0 Circulation sensation and movements: WNL Comments: procedure tolerated well.

## 2017-02-25 ENCOUNTER — Encounter (HOSPITAL_COMMUNITY)
Admission: EM | Disposition: A | Discharge status: Home Health | Payer: Self-pay | Source: Home / Self Care | Attending: Cardiovascular Disease

## 2017-02-25 ENCOUNTER — Encounter (HOSPITAL_COMMUNITY): Payer: Self-pay | Admitting: Cardiovascular Disease

## 2017-02-25 DIAGNOSIS — I25810 Atherosclerosis of coronary artery bypass graft(s) without angina pectoris: Secondary | ICD-10-CM

## 2017-02-25 DIAGNOSIS — I480 Paroxysmal atrial fibrillation: Secondary | ICD-10-CM

## 2017-02-25 HISTORY — PX: CORONARY ANGIOGRAPHY: CATH118303

## 2017-02-25 LAB — BASIC METABOLIC PANEL
ANION GAP: 12 (ref 5–15)
BUN: 21 mg/dL — ABNORMAL HIGH (ref 6–20)
CHLORIDE: 101 mmol/L (ref 101–111)
CO2: 23 mmol/L (ref 22–32)
Calcium: 8.9 mg/dL (ref 8.9–10.3)
Creatinine, Ser: 1.24 mg/dL (ref 0.61–1.24)
GFR calc Af Amer: 57 mL/min — ABNORMAL LOW (ref >60)
GFR, EST NON AFRICAN AMERICAN: 49 mL/min — AB (ref >60)
Glucose, Bld: 120 mg/dL — ABNORMAL HIGH (ref 65–99)
POTASSIUM: 3.5 mmol/L (ref 3.5–5.1)
SODIUM: 136 mmol/L (ref 135–145)

## 2017-02-25 LAB — CREATININE, SERUM
Creatinine, Ser: 1.25 mg/dL — ABNORMAL HIGH (ref 0.61–1.24)
GFR calc non Af Amer: 49 mL/min — ABNORMAL LOW (ref >60)
GFR, EST AFRICAN AMERICAN: 57 mL/min — AB (ref >60)

## 2017-02-25 LAB — CBC
HCT: 35.5 % — ABNORMAL LOW (ref 39.0–52.0)
HEMATOCRIT: 33.9 % — AB (ref 39.0–52.0)
HEMOGLOBIN: 12 g/dL — AB (ref 13.0–17.0)
HEMOGLOBIN: 11.7 g/dL — AB (ref 13.0–17.0)
MCH: 31.1 pg (ref 26.0–34.0)
MCH: 31.8 pg (ref 26.0–34.0)
MCHC: 33.8 g/dL (ref 30.0–36.0)
MCHC: 34.5 g/dL (ref 30.0–36.0)
MCV: 92 fL (ref 78.0–100.0)
MCV: 92.1 fL (ref 78.0–100.0)
PLATELETS: 195 10*3/uL (ref 150–400)
PLATELETS: 168 10*3/uL (ref 150–400)
RBC: 3.86 MIL/uL — AB (ref 4.22–5.81)
RBC: 3.68 MIL/uL — AB (ref 4.22–5.81)
RDW: 13.9 % (ref 11.5–15.5)
RDW: 13.8 % (ref 11.5–15.5)
WBC: 5.5 10*3/uL (ref 4.0–10.5)
WBC: 7.3 10*3/uL (ref 4.0–10.5)

## 2017-02-25 LAB — TROPONIN I: TROPONIN I: 12.48 ng/mL — AB (ref <0.03)

## 2017-02-25 SURGERY — CORONARY ANGIOGRAPHY (CATH LAB)
Anesthesia: LOCAL

## 2017-02-25 MED ORDER — SODIUM CHLORIDE 0.9 % IV SOLN
INTRAVENOUS | Status: AC
  Administered 2017-02-25: 18:00:00 via INTRAVENOUS

## 2017-02-25 MED ORDER — LIDOCAINE HCL (PF) 1 % IJ SOLN
INTRAMUSCULAR | Status: DC | PRN
  Administered 2017-02-25: 3 mL

## 2017-02-25 MED ORDER — DOCUSATE SODIUM 100 MG PO CAPS
100.0000 mg | ORAL_CAPSULE | Freq: Every day | ORAL | Status: DC | PRN
  Administered 2017-02-25 – 2017-02-26 (×2): 100 mg via ORAL
  Filled 2017-02-25 (×2): qty 1

## 2017-02-25 MED ORDER — SODIUM CHLORIDE 0.9 % WEIGHT BASED INFUSION: 1.0000 mL/kg/h | INTRAVENOUS | Status: DC

## 2017-02-25 MED ORDER — HEPARIN (PORCINE) IN NACL 100-0.45 UNIT/ML-% IJ SOLN
INTRAMUSCULAR | Status: AC
  Administered 2017-02-25: 17:00:00
  Filled 2017-02-25: qty 250

## 2017-02-25 MED ORDER — SODIUM CHLORIDE 0.9 % IV SOLN: 250.0000 mL | INTRAVENOUS | Status: DC | PRN

## 2017-02-25 MED ORDER — MAGNESIUM HYDROXIDE 400 MG/5ML PO SUSP
30.0000 mL | ORAL | Status: DC | PRN
  Administered 2017-02-25 – 2017-02-26 (×3): 30 mL via ORAL
  Filled 2017-02-25 (×3): qty 30

## 2017-02-25 MED ORDER — NITROGLYCERIN IN D5W 200-5 MCG/ML-% IV SOLN
INTRAVENOUS | Status: AC
  Administered 2017-02-25: 50000 ug via INTRAVENOUS
  Filled 2017-02-25: qty 250

## 2017-02-25 MED ORDER — SODIUM CHLORIDE 0.9% FLUSH: 3.0000 mL | INTRAVENOUS | Status: DC | PRN

## 2017-02-25 MED ORDER — HEPARIN SODIUM (PORCINE) 1000 UNIT/ML IJ SOLN
INTRAMUSCULAR | Status: DC | PRN
  Administered 2017-02-25: 3500 [IU] via INTRAVENOUS

## 2017-02-25 MED ORDER — IOPAMIDOL (ISOVUE-370) INJECTION 76%
INTRAVENOUS | Status: AC
  Filled 2017-02-25: qty 100

## 2017-02-25 MED ORDER — IOPAMIDOL (ISOVUE-370) INJECTION 76%
INTRAVENOUS | Status: DC | PRN
  Administered 2017-02-25: 85 mL via INTRA_ARTERIAL

## 2017-02-25 MED ORDER — HEPARIN SODIUM (PORCINE) 5000 UNIT/ML IJ SOLN
5000.0000 [IU] | Freq: Three times a day (TID) | INTRAMUSCULAR | Status: DC
  Administered 2017-02-25 – 2017-02-26 (×2): 5000 [IU] via SUBCUTANEOUS
  Filled 2017-02-25 (×2): qty 1

## 2017-02-25 MED ORDER — ISOSORBIDE MONONITRATE ER 30 MG PO TB24
30.0000 mg | ORAL_TABLET | Freq: Every day | ORAL | Status: DC
  Administered 2017-02-25 – 2017-02-26 (×2): 30 mg via ORAL
  Filled 2017-02-25 (×2): qty 1

## 2017-02-25 MED ORDER — HEPARIN (PORCINE) IN NACL 2-0.9 UNIT/ML-% IJ SOLN
INTRAMUSCULAR | Status: DC | PRN
  Administered 2017-02-25: 1000 mL

## 2017-02-25 MED ORDER — LIDOCAINE HCL 1 % IJ SOLN
INTRAMUSCULAR | Status: AC
  Filled 2017-02-25: qty 20

## 2017-02-25 MED ORDER — HEPARIN SODIUM (PORCINE) 1000 UNIT/ML IJ SOLN
INTRAMUSCULAR | Status: AC
  Filled 2017-02-25: qty 1

## 2017-02-25 MED ORDER — HEPARIN (PORCINE) IN NACL 2-0.9 UNIT/ML-% IJ SOLN
INTRAMUSCULAR | Status: DC | PRN
  Administered 2017-02-25: 10 mL via INTRA_ARTERIAL

## 2017-02-25 MED ORDER — HEPARIN (PORCINE) IN NACL 100-0.45 UNIT/ML-% IJ SOLN
900.0000 [IU]/h | INTRAMUSCULAR | Status: DC
  Administered 2017-02-25: 900 [IU]/h via INTRAVENOUS

## 2017-02-25 MED ORDER — SODIUM CHLORIDE 0.9% FLUSH: 3.0000 mL | Freq: Two times a day (BID) | INTRAVENOUS | Status: DC

## 2017-02-25 MED ORDER — ACETAMINOPHEN 325 MG PO TABS: 650.0000 mg | ORAL_TABLET | ORAL | Status: DC | PRN

## 2017-02-25 MED ORDER — SODIUM CHLORIDE 0.9 % WEIGHT BASED INFUSION
3.0000 mL/kg/h | INTRAVENOUS | Status: DC
  Administered 2017-02-25: 17:00:00 3 mL/kg/h via INTRAVENOUS

## 2017-02-25 MED ORDER — ALUM & MAG HYDROXIDE-SIMETH 200-200-20 MG/5ML PO SUSP
30.0000 mL | ORAL | Status: DC | PRN
  Administered 2017-02-25: 30 mL via ORAL
  Filled 2017-02-25: qty 30

## 2017-02-25 MED ORDER — ONDANSETRON HCL 4 MG/2ML IJ SOLN: 4.0000 mg | Freq: Four times a day (QID) | INTRAMUSCULAR | Status: DC | PRN

## 2017-02-25 MED ORDER — OXYCODONE-ACETAMINOPHEN 5-325 MG PO TABS: 1.0000 | ORAL_TABLET | ORAL | Status: DC | PRN

## 2017-02-25 MED ORDER — VERAPAMIL HCL 2.5 MG/ML IV SOLN
INTRAVENOUS | Status: AC
  Filled 2017-02-25: qty 2

## 2017-02-25 MED ORDER — SODIUM CHLORIDE 0.9% FLUSH
3.0000 mL | Freq: Two times a day (BID) | INTRAVENOUS | Status: DC
  Administered 2017-02-26: 11:00:00 3 mL via INTRAVENOUS

## 2017-02-25 MED ORDER — HEPARIN (PORCINE) IN NACL 2-0.9 UNIT/ML-% IJ SOLN
INTRAMUSCULAR | Status: AC
  Filled 2017-02-25: qty 1000

## 2017-02-25 MED FILL — Lidocaine HCl Local Inj 1%: INTRAMUSCULAR | Qty: 20 | Status: AC

## 2017-02-25 MED FILL — Nitroglycerin IV Soln 100 MCG/ML in D5W: INTRA_ARTERIAL | Qty: 10 | Status: AC

## 2017-02-25 SURGICAL SUPPLY — 16 items
CATH INFINITI 5 FR JL3.5 (CATHETERS) ×1 IMPLANT
CATH INFINITI JR4 5F (CATHETERS) ×1 IMPLANT
CATH LAUNCHER 5F EBU3.5 (CATHETERS) ×1 IMPLANT
CATH LAUNCHER 5F EBU4.0 (CATHETERS) ×1 IMPLANT
COVER PRB 48X5XTLSCP FOLD TPE (BAG) IMPLANT
COVER PROBE 5X48 (BAG) ×2
DEVICE RAD COMP TR BAND LRG (VASCULAR PRODUCTS) ×1 IMPLANT
GLIDESHEATH SLEND A-KIT 6F 22G (SHEATH) ×1 IMPLANT
GUIDEWIRE INQWIRE 1.5J.035X260 (WIRE) IMPLANT
INQWIRE 1.5J .035X260CM (WIRE) ×2
KIT HEART LEFT (KITS) ×2 IMPLANT
PACK CARDIAC CATHETERIZATION (CUSTOM PROCEDURE TRAY) ×2 IMPLANT
TRANSDUCER W/STOPCOCK (MISCELLANEOUS) ×2 IMPLANT
TUBING CIL FLEX 10 FLL-RA (TUBING) ×2 IMPLANT
WIRE ASAHI PROWATER 180CM (WIRE) ×1 IMPLANT
WIRE HI TORQ VERSACORE-J 145CM (WIRE) ×1 IMPLANT

## 2017-02-25 NOTE — Progress Notes (Signed)
ANTICOAGULATION CONSULT NOTE Pharmacy Consult for Heparin Indication: chest pain/ACS  No Known Allergies  Patient Measurements: Height: 5\' 6"  (167.6 cm) Weight: 151 lb 10.8 oz (68.8 kg) IBW/kg (Calculated) : 63.8  Vital Signs: Temp: 98.1 F (36.7 C) (04/19 1440) Temp Source: Oral (04/19 1440) BP: 152/68 (04/19 1440) Pulse Rate: 65 (04/19 1440)  Labs:  Recent Labs  02/23/17 1123  02/23/17 1515 02/23/17 2042 02/23/17 2303 02/24/17 0343 02/25/17 0310 02/25/17 1439  HGB 11.4*  --   --   --   --  11.8* 12.0*  --   HCT 33.5*  --   --   --   --  34.5* 35.5*  --   PLT 184  --   --   --   --  178 195  --   LABPROT  --   --  15.2  --   --   --   --   --   INR  --   --  1.19  --   --   --   --   --   HEPARINUNFRC  --   --   --   --  0.52 0.57  --   --   CREATININE 1.25*  --   --   --   --  1.14 1.24  --   TROPONINI  --   < > <0.03 <0.03  --  0.03*  --  12.48*  < > = values in this interval not displayed.  Estimated Creatinine Clearance: 35.7 mL/min (by C-G formula based on SCr of 1.24 mg/dL).  Assessment: 83 y.o. male s/p cath PCI DES to LCX on 4/18.   Received call from 6c nurse indicating possible in-stent restenosis and need to restart heparin per Dr.Ross's orders. Will restart heparin without bolus at previous rate of 900 units/hr which he was therapeutic. Patient did receive lovenox this morning which could influence initial heparin level.  Goal of Therapy:  Heparin level 0.3-0.7 units/ml Monitor platelets by anticoagulation protocol: Yes   Plan:  Resume Heparin at 900 units/hr Follow-up for possible re-cath  Erin Hearing PharmD., BCPS Clinical Pharmacist Pager [phone] 02/25/2017 4:29 PM

## 2017-02-25 NOTE — Progress Notes (Signed)
CARDIAC REHAB PHASE I   PRE:  Rate/Rhythm: 65 pacing    BP: sitting 151/78    SaO2:   MODE:  Ambulation: 300 ft   POST:  Rate/Rhythm: 76 pacing    BP: sitting 151/87     SaO2:   Pt c/o chest burning 3/10 in bed. He vomited his breakfast. He also c/o left groin pain when he lifts his leg and sts he feels a knot. Nothing noted by RN. Pt able to get out of bed and walk with RW. Steady, slow, no LOB. Sts his chest burning might have decreased slightly with walking. To recliner, made sure he is sitting upright and encouraged him to eat that way today. Ed completed with pt and daughter. Will refer to [place] however I am not sure he will do program. His 60 year old wife drives. [place], ACSM 02/25/2017 9:15 AM

## 2017-02-25 NOTE — Progress Notes (Signed)
    Patient continued to have 3/10 chest tightness and burning. Repeat EKG with no acute findings as he is paced. Follow up troponin done, resulted at 12.4. Talked with patient, still having burning discomfort. DOD Dr. Harrington Challenger at the bedside, with myself to assess the patient. Decision made to bring back for relook cardiac cath. Pt prepped and sent back to the lab for cath with Dr. Tamala Julian. Started back on IV nitro and Heparin without bolus.  The patient understands that risks included but are not limited to stroke (1 in 1000), death (1 in 55), kidney failure [usually temporary] (1 in 500), bleeding (1 in 200), allergic reaction [possibly serious] (1 in 200).   Barnet Pall, NP-C 02/25/2017, 4:42 PM Pager: [phone]

## 2017-02-25 NOTE — Care Management Note (Signed)
Case Management Note  Patient Details  Name: John Peck MRN: [id number] Date of Birth: Jan 05, 1924  Subjective/Objective:     s/p coronary stent intervention, will be on plavix and asa NCM will follow for dc needs.               Action/Plan:   Expected Discharge Date:                  Expected Discharge Plan:     In-House Referral:     Discharge planning Services  CM Consult  Post Acute Care Choice:    Choice offered to:     DME Arranged:    DME Agency:     HH Arranged:    HH Agency:     Status of Service:  In process, will continue to follow  If discussed at Long Length of Stay Meetings, dates discussed:    Additional Comments:  Zenon Mayo, RN 02/25/2017, 7:00 AM

## 2017-02-25 NOTE — Progress Notes (Signed)
Progress Note  Patient Name: John Peck Date of Encounter: 02/25/2017  Primary Cardiologist: [place] with some episodes of chest pain. Nausea and vomiting with breakfast this morning.   Inpatient Medications    Scheduled Meds: . aspirin EC  81 mg Oral Daily  . atorvastatin  80 mg Oral q1800  . carvedilol  25 mg Oral BID WC  . clopidogrel  75 mg Oral Q1500  . diltiazem  120 mg Oral Daily  . doxycycline  100 mg Oral BID  . enoxaparin (LOVENOX) injection  40 mg Subcutaneous Q24H  . ferrous sulfate  325 mg Oral TID WC  . latanoprost  1 drop Both Eyes Daily  . levothyroxine  125 mcg Oral QAC breakfast  . pantoprazole  40 mg Oral BID  . ranolazine  1,000 mg Oral BID  . sodium chloride flush  3 mL Intravenous Q12H  . sodium chloride flush  3 mL Intravenous Q12H  . tamsulosin  0.4 mg Oral Daily   Continuous Infusions: . sodium chloride    . sodium chloride    . nitroGLYCERIN 20 mcg/min (02/25/17 0000)   PRN Meds: sodium chloride, sodium chloride, acetaminophen, ipratropium-albuterol, lubiprostone, nitroGLYCERIN, ondansetron (ZOFRAN) IV, sodium chloride flush, sodium chloride flush   Vital Signs    Vitals:   02/24/17 1953 02/25/17 0225 02/25/17 0600 02/25/17 0700  BP: 131/83 (!) 162/79 (!) 156/65 (!) 172/73  Pulse: 66 75  70  Resp: (!) 27 14 20 19   Temp: 97.8 F (36.6 C) 98.4 F (36.9 C)  98.5 F (36.9 C)  TempSrc: Oral Oral  Oral  SpO2: 95% 94%  96%  Weight:  151 lb 10.8 oz (68.8 kg)    Height:        Intake/Output Summary (Last 24 hours) at 02/25/17 0803 Last data filed at 02/25/17 0700  Gross per 24 hour  Intake           1427.6 ml  Output             1325 ml  Net            102.6 ml   Filed Weights   02/23/17 1830 02/24/17 0439 02/25/17 0225  Weight: 162 lb 4.8 oz (73.6 kg) 159 lb 9.6 oz (72.4 kg) 151 lb 10.8 oz (68.8 kg)    Telemetry    AV paced - Personally Reviewed  ECG    AV paced with TWI in anterior leads -  Personally Reviewed  Physical Exam   General: Well developed, well nourished, male appearing in no acute distress. Head: Normocephalic, atraumatic.  Neck: Supple without bruits, JVD. Lungs:  Resp regular and unlabored, CTA. Heart: RRR, S1, S2, no S3, S4, or murmur; no rub. Abdomen: Soft, non-tender, non-distended with normoactive bowel sounds. No hepatomegaly. No rebound/guarding. No obvious abdominal masses. Extremities: No clubbing, cyanosis, edema. Distal pedal pulses are 2+ bilaterally. Left radial cath site stable without bruising or hematoma. Neuro: Alert and oriented X 3. Moves all extremities spontaneously. Psych: Normal affect.  Labs    Chemistry Recent Labs Lab 02/23/17 1123 02/23/17 1515 02/24/17 0343 02/25/17 0310  NA 138  --  139 136  K 3.6  --  3.5 3.5  CL 104  --  104 101  CO2 26  --  27 23  GLUCOSE 101*  --  93 120*  BUN 18  --  17 21*  CREATININE 1.25*  --  1.14 1.24  CALCIUM 8.9  --  8.9 8.9  PROT  --  6.7  --   --   ALBUMIN  --  3.4*  --   --   AST  --  30  --   --   ALT  --  14*  --   --   ALKPHOS  --  82  --   --   BILITOT  --  1.8*  --   --   GFRNONAA 49*  --  55* 49*  GFRAA 57*  --  >60 57*  ANIONGAP 8  --  8 12     Hematology Recent Labs Lab 02/23/17 1123 02/24/17 0343 02/25/17 0310  WBC 4.7 5.6 5.5  RBC 3.60* 3.74* 3.86*  HGB 11.4* 11.8* 12.0*  HCT 33.5* 34.5* 35.5*  MCV 93.1 92.2 92.0  MCH 31.7 31.6 31.1  MCHC 34.0 34.2 33.8  RDW 13.7 13.7 13.9  PLT 184 178 195    Cardiac Enzymes Recent Labs Lab 02/23/17 1515 02/23/17 2042 02/24/17 0343  TROPONINI <0.03 <0.03 0.03*    Recent Labs Lab 02/23/17 1134  TROPIPOC 0.00     BNP Recent Labs Lab 02/23/17 1128  BNP 530.4*     DDimer No results for input(s): DDIMER in the last 168 hours.    Radiology    Dg Chest 2 View  Result Date: 02/23/2017 CLINICAL DATA:  Substernal chest pain for 1 month EXAM: CHEST  2 VIEW COMPARISON:  02/12/2017 FINDINGS: Cardiac shadow is  stable. Postsurgical changes are again seen. Pacing device is again noted and stable. Extrinsic lead is noted over the right upper lobe. No focal infiltrate or sizable effusion is seen. No acute bony abnormality is noted. IMPRESSION: No active cardiopulmonary disease. Electronically Signed   By: Inez Catalina M.D.   On: 02/23/2017 11:05    Cardiac Studies   Cardiac Cath/PCI Conclusion     Ost LM to LM lesion, 20 %stenosed.  2nd Mrg lesion, 40 %stenosed.  Mid LAD lesion, 100 %stenosed.  LIMA.  Origin lesion, 100 %stenosed.  Acute Mrg lesion, 90 %stenosed.  Prox RCA-2 lesion, 10 %stenosed.  Dist RCA lesion, 0 %stenosed.  A drug eluting .  Prox RCA-1 lesion, 40 %stenosed.  Ost LAD lesion, 50 %stenosed.  Mid Cx lesion, 30 %stenosed.  Ost 1st Diag to 1st Diag lesion, 30 %stenosed.  There is mild left ventricular systolic dysfunction.  LV end diastolic pressure is mildly elevated.  The left ventricular ejection fraction is 45-50% by visual estimate.  A STENT RESOLUTE ONYX 2.5X18 drug eluting stent was successfully placed.  Prox Cx lesion, 80 %stenosed.  Post intervention, there is a 0% residual stenosis.  1. Significant native three-vessel coronary artery disease with occluded mid LAD. Patent LIMA to LAD and patent stents in the right coronary artery. Progression of proximal left circumflex disease with known occluded SVG graft to OM. 2. Mildly reduced LV systolic function and mildly elevated left ventricular end-diastolic pressure. 3. Successful angioplasty and drug-eluting stent placement to the proximal left circumflex extending all the way back to the ostium.  Recommendations: Dual antiplatelet therapy for at least one year and preferably indefinitely. The patient was noted to have episodes of paroxysmal tachycardia throughout the case likely atrial fibrillation versus atrial tachycardia with a heart rate up to 110 bpm. This might be also responsible for some of  his anginal symptoms. I switched amlodipine to diltiazem for better rate control.  Difficult cath via the left radial artery due to left radial loop and left subclavian artery  tortuosity.        Post-Intervention Diagram        Patient Profile     83 y.o. male who presented to the ER on 02/23/17 for progressively worsening chest pain. Hx of PAD, extensive CAD s/p CABG and subsequent interventions, iron deficiency anemia, hypertension, BPH, status post TURP in 1992, remote history of liver abscess, history of COPD.  Assessment & Plan    1. Unstable angina:  s/p DES stent to LCX proximal stenosis extending to the ostium on 02/24/17. Did have intermittent episodes of chest tightness last night and this morning, not as bad as previous pain prior to PCI. Episode of vomiting this morning after eating. ?GI component to chest discomfort -- on IV nitro, will dc and start Imdur 30mg  daily  -- DAPT with ASA, plavix  2. PAF:  Currently AV pacing, but had bursts of rapid atrial tachycardia during cath, suspect these episodes had contributed to his increasing recent chest pain.   -- On carvedilol 25 mg bid;  -- amlodipine dc'd and changed to diltiazem. Rate improved overnight.   3. Hyperlipidemia; now on increased atorvastatin  4. CKD stage 3: Cr stable  Signed, Reino Bellis, NP  02/25/2017, 8:03 AM    Agree with note by Reino Bellis NP-C  Excellent result from PCI/DES to prox native LCX by Dr Toni Arthurs. Exam benign. On DAPT. Intermittent pacing. Labs OK. C/O N/V and atyp CP. Will DC iv NTG and transition to PO Imdur. Ambulate with [facility]. Will keep today and watch/ ambulate. Tx tele. Potentially home tomorrow.   Lorretta Harp, M.D., [place], [facility], [place] [address] [place], [place]  [id number]  [phone] 02/25/2017 8:49 AM

## 2017-02-26 ENCOUNTER — Encounter (HOSPITAL_COMMUNITY): Payer: Self-pay | Admitting: Interventional Cardiology

## 2017-02-26 DIAGNOSIS — I214 Non-ST elevation (NSTEMI) myocardial infarction: Secondary | ICD-10-CM

## 2017-02-26 DIAGNOSIS — K5900 Constipation, unspecified: Secondary | ICD-10-CM

## 2017-02-26 LAB — CBC
HCT: 33.1 % — ABNORMAL LOW (ref 39.0–52.0)
HEMOGLOBIN: 11 g/dL — AB (ref 13.0–17.0)
MCH: 30.7 pg (ref 26.0–34.0)
MCHC: 33.2 g/dL (ref 30.0–36.0)
MCV: 92.5 fL (ref 78.0–100.0)
Platelets: 174 10*3/uL (ref 150–400)
RBC: 3.58 MIL/uL — AB (ref 4.22–5.81)
RDW: 14 % (ref 11.5–15.5)
WBC: 7.5 10*3/uL (ref 4.0–10.5)

## 2017-02-26 LAB — BASIC METABOLIC PANEL
ANION GAP: 9 (ref 5–15)
BUN: 24 mg/dL — AB (ref 6–20)
CHLORIDE: 102 mmol/L (ref 101–111)
CO2: 23 mmol/L (ref 22–32)
Calcium: 8.7 mg/dL — ABNORMAL LOW (ref 8.9–10.3)
Creatinine, Ser: 1.36 mg/dL — ABNORMAL HIGH (ref 0.61–1.24)
GFR, EST AFRICAN AMERICAN: 51 mL/min — AB (ref >60)
GFR, EST NON AFRICAN AMERICAN: 44 mL/min — AB (ref >60)
Glucose, Bld: 142 mg/dL — ABNORMAL HIGH (ref 65–99)
POTASSIUM: 4.5 mmol/L (ref 3.5–5.1)
SODIUM: 134 mmol/L — AB (ref 135–145)

## 2017-02-26 LAB — TROPONIN I: TROPONIN I: 6.95 ng/mL — AB (ref <0.03)

## 2017-02-26 MED ORDER — DILTIAZEM HCL ER COATED BEADS 180 MG PO CP24: 180.0000 mg | ORAL_CAPSULE | Freq: Every day | ORAL | 6 refills | Status: DC

## 2017-02-26 MED ORDER — DILTIAZEM HCL ER COATED BEADS 180 MG PO CP24: 180.0000 mg | ORAL_CAPSULE | Freq: Every day | ORAL | Status: DC

## 2017-02-26 MED ORDER — DOCUSATE SODIUM 100 MG PO CAPS: 100.0000 mg | ORAL_CAPSULE | Freq: Every day | ORAL | 0 refills | Status: DC | PRN

## 2017-02-26 MED ORDER — DILTIAZEM HCL ER COATED BEADS 240 MG PO CP24
240.0000 mg | ORAL_CAPSULE | Freq: Every day | ORAL | Status: DC
  Administered 2017-02-26: 240 mg via ORAL
  Filled 2017-02-26: qty 1

## 2017-02-26 MED ORDER — DILTIAZEM HCL ER COATED BEADS 120 MG PO TB24: 180.0000 mg | ORAL_TABLET | Freq: Every day | ORAL | Status: DC

## 2017-02-26 MED ORDER — ENSURE ENLIVE PO LIQD
237.0000 mL | Freq: Two times a day (BID) | ORAL | Status: DC
  Administered 2017-02-26: 237 mL via ORAL
  Filled 2017-02-26 (×4): qty 237

## 2017-02-26 MED ORDER — BISACODYL 10 MG RE SUPP: 10.0000 mg | Freq: Every day | RECTAL | Status: DC | PRN

## 2017-02-26 MED ORDER — ISOSORBIDE MONONITRATE ER 30 MG PO TB24: 30.0000 mg | ORAL_TABLET | Freq: Every day | ORAL | 3 refills | Status: DC

## 2017-02-26 MED ORDER — LUBIPROSTONE 24 MCG PO CAPS
24.0000 ug | ORAL_CAPSULE | Freq: Two times a day (BID) | ORAL | Status: DC
  Administered 2017-02-26: 11:00:00 24 ug via ORAL
  Filled 2017-02-26 (×2): qty 1

## 2017-02-26 NOTE — Progress Notes (Addendum)
Progress Note  Patient Name: John Peck Date of Encounter: 02/26/2017  Primary Cardiologist: Dr. Claiborne Billings  Subjective   No recurrent chest pain overnight or this morning. No dyspnea.   Inpatient Medications    Scheduled Meds: . aspirin EC  81 mg Oral Daily  . atorvastatin  80 mg Oral q1800  . carvedilol  25 mg Oral BID WC  . clopidogrel  75 mg Oral Q1500  . diltiazem  120 mg Oral Daily  . doxycycline  100 mg Oral BID  . ferrous sulfate  325 mg Oral TID WC  . heparin  5,000 Units Subcutaneous Q8H  . isosorbide mononitrate  30 mg Oral Daily  . latanoprost  1 drop Both Eyes Daily  . levothyroxine  125 mcg Oral QAC breakfast  . pantoprazole  40 mg Oral BID  . ranolazine  1,000 mg Oral BID  . sodium chloride flush  3 mL Intravenous Q12H  . sodium chloride flush  3 mL Intravenous Q12H  . sodium chloride flush  3 mL Intravenous Q12H  . tamsulosin  0.4 mg Oral Daily   Continuous Infusions: . sodium chloride    . sodium chloride    . sodium chloride     PRN Meds: sodium chloride, sodium chloride, sodium chloride, acetaminophen, alum & mag hydroxide-simeth, docusate sodium, ipratropium-albuterol, lubiprostone, magnesium hydroxide, nitroGLYCERIN, ondansetron (ZOFRAN) IV, oxyCODONE-acetaminophen, sodium chloride flush, sodium chloride flush, sodium chloride flush   Vital Signs    Vitals:   02/25/17 1926 02/25/17 2000 02/25/17 2100 02/26/17 0415  BP: 136/61 (!) 121/55 (!) 154/63 (!) 125/50  Pulse: 66 64 65 73  Resp: (!) 22 20 (!) 31 16  Temp: 97.9 F (36.6 C)   98.6 F (37 C)  TempSrc: Axillary   Axillary  SpO2: 98% 98% 97% 95%  Weight:    161 lb 13.1 oz (73.4 kg)  Height:        Intake/Output Summary (Last 24 hours) at 02/26/17 0807 Last data filed at 02/26/17 0000  Gross per 24 hour  Intake              620 ml  Output              575 ml  Net               45 ml   Filed Weights   02/24/17 0439 02/25/17 0225 02/26/17 0415  Weight: 159 lb 9.6 oz (72.4 kg) 151  lb 10.8 oz (68.8 kg) 161 lb 13.1 oz (73.4 kg)    Telemetry    AV pacing - Personally Reviewed  ECG    V paced - Personally Reviewed  Physical Exam   GEN: No acute distress.   Neck: No JVD Cardiac: RRR, no murmurs, rubs, or gallops. No hematoma or bruise on R and L radial cath site.  Respiratory: Clear to auscultation bilaterally. GI: Soft, nontender, non-distended  MS: No edema; No deformity. Neuro:  Nonfocal  Psych: Normal affect   Labs    Chemistry Recent Labs Lab 02/23/17 1123 02/23/17 1515 02/24/17 0343 02/25/17 0310 02/25/17 1941  NA 138  --  139 136  --   K 3.6  --  3.5 3.5  --   CL 104  --  104 101  --   CO2 26  --  27 23  --   GLUCOSE 101*  --  93 120*  --   BUN 18  --  17 21*  --   CREATININE 1.25*  --  1.14 1.24 1.25*  CALCIUM 8.9  --  8.9 8.9  --   PROT  --  6.7  --   --   --   ALBUMIN  --  3.4*  --   --   --   AST  --  30  --   --   --   ALT  --  14*  --   --   --   ALKPHOS  --  82  --   --   --   BILITOT  --  1.8*  --   --   --   GFRNONAA 49*  --  55* 49* 49*  GFRAA 57*  --  >60 57* 57*  ANIONGAP 8  --  8 12  --      Hematology Recent Labs Lab 02/25/17 0310 02/25/17 1941 02/26/17 0458  WBC 5.5 7.3 7.5  RBC 3.86* 3.68* 3.58*  HGB 12.0* 11.7* 11.0*  HCT 35.5* 33.9* 33.1*  MCV 92.0 92.1 92.5  MCH 31.1 31.8 30.7  MCHC 33.8 34.5 33.2  RDW 13.9 13.8 14.0  PLT 195 168 174    Cardiac Enzymes Recent Labs Lab 02/23/17 1515 02/23/17 2042 02/24/17 0343 02/25/17 1439  TROPONINI <0.03 <0.03 0.03* 12.48*    Recent Labs Lab 02/23/17 1134  TROPIPOC 0.00     BNP Recent Labs Lab 02/23/17 1128  BNP 530.4*     DDimer No results for input(s): DDIMER in the last 168 hours.   Radiology    No results found.  Cardiac Studies   Coronary Stent Intervention   02/24/17  Left Heart Cath and Cors/Grafts Angiography     Ost LM to LM lesion, 20 %stenosed.  2nd Mrg lesion, 40 %stenosed.  Mid LAD lesion, 100  %stenosed.  LIMA.  Origin lesion, 100 %stenosed.  Acute Mrg lesion, 90 %stenosed.  Prox RCA-2 lesion, 10 %stenosed.  Dist RCA lesion, 0 %stenosed.  A drug eluting .  Prox RCA-1 lesion, 40 %stenosed.  Ost LAD lesion, 50 %stenosed.  Mid Cx lesion, 30 %stenosed.  Ost 1st Diag to 1st Diag lesion, 30 %stenosed.  There is mild left ventricular systolic dysfunction.  LV end diastolic pressure is mildly elevated.  The left ventricular ejection fraction is 45-50% by visual estimate.  A STENT RESOLUTE ONYX 2.5X18 drug eluting stent was successfully placed.  Prox Cx lesion, 80 %stenosed.  Post intervention, there is a 0% residual stenosis.  1. Significant native three-vessel coronary artery disease with occluded mid LAD. Patent LIMA to LAD and patent stents in the right coronary artery. Progression of proximal left circumflex disease with known occluded SVG graft to OM. 2. Mildly reduced LV systolic function and mildly elevated left ventricular end-diastolic pressure. 3. Successful angioplasty and drug-eluting stent placement to the proximal left circumflex extending all the way back to the ostium.  Recommendations: Dual antiplatelet therapy for at least one year and preferably indefinitely. The patient was noted to have episodes of paroxysmal tachycardia throughout the case likely atrial fibrillation versus atrial tachycardia with a heart rate up to 110 bpm. This might be also responsible for some of his anginal symptoms. I switched amlodipine to diltiazem for better rate control.  Difficult cath via the left radial artery due to left radial loop and left subclavian artery tortuosity.        Post-Intervention Diagram       Coronary Angiography  02/25/17  Conclusion    Complicated coronary only procedure due to marked tortuosity within the subclavian and  innominate artery preventing catheter control and advancement.  Right coronary is unchanged.  The native left  coronary demonstrates wide patency of the stent implanted yesterday.  Chest pain and elevated troponin will need to be further investigated. It is possible that the elevated troponin is a residual of the PCI performed yesterday. The current chest pain complaint is vague and according to the patient, present for quite some time before admission to the hospital.    Patient Profile     83 y.o. male who presented to the ER on 02/23/17 for progressively worsening chest pain. Hx of PAD, extensive CAD s/p CABG and subsequent interventions, iron deficiency anemia, hypertension, BPH, status post TURP in 1992, remote history of liver abscess, history of COPD.  Assessment & Plan    1. Unstable angina:   - He was admitted and started on IV nitro (held Imdur) and IV morphine. Initial troponin x 3 negative prior to cath 4/18--> s/p DES stent to LCX proximal stenosis extending to the ostium. Discontinue IV nitro and started back on home dose of Imdur 30mg . However developed intermittent chest tightness and burning sensation. Repeat EKG with no acute findings. Follow up troponin resulted at 12.4. Started back on IV nitro and Heparin without bolus. Decision made to bring back for relook cardiac cath  02/25/17 which showed patent Lcx stent with stable anatomy. Complicated coronary only procedure due to marked tortuosity within the subclavian and innominate artery preventing catheter control and advancement.  - Patient did had bursts of rapid atrial tachycardia during cath 4/18, suspect these episodes had contributed to his recurrent chest pain which now resolved.  - Continue ASA, Plavix, BB, statin,  Imdur, and Renexa.   2. ICM ? - Cath on 4/18 showed mildly reduced LV systolic function and mildly elevated left ventricular end-diastolic pressure.BNP of 530 on presentation. Net I & O negative 1.5L. Weight down 6lb 167-->161lb. - Last echo 11/2015 showed LV EF of 50-55%. He is euvolemic currently. No dyspnea. Will  defer further management per MD.    3. PAF:   - Currently AV pacing, but had bursts of rapid atrial tachycardia during cath as above, suspect these episodes had contributed to his increasing recent chest pain.  On carvedilol 25 mg bid; amlodipine dc'd and change to diltiazem-->no recurrent tachycardia.  -Not on anticoagulation. Unsure. Will defer to Dr. Claiborne Billings. CHADSVASc score of 4 (age, HTN and vascular disease)   4. Hyperlipidemia - 02/24/2017: Cholesterol 224; HDL 52; LDL Cholesterol 160; Triglycerides 58; VLDL 12 - Increased Lipitor to 80mg  this admission. Consider repeat lipid panel and LFTs in 6 weeks.   5. CKD stage 3 - Stable renal function   6. Symptomatic bradycardia - s/p pacemaker, followed by Dr. Lovena Le. AV pacing.   Signed, Leanor Kail, PA  02/26/2017, 8:07 AM    Patient seen and examined. Agree with assessment and plan. No further chest; feels well, wanting to go home.LCX patent on repeat cath. BP 136/84 presently.  With SVT noted at initial cath, not AF; will further titrate to cardizem  180  Mg daily.  AV paced rhtyhm.  Will give dulcolax suppository since no BM in 5 days. PT to see. Aim for dc later today. Continue DAPT with recent stent and with age 15 would not use triple therapy.    Troy Sine, MD, [facility] 02/26/2017 10:28 AM

## 2017-02-26 NOTE — Discharge Summary (Signed)
Discharge Summary    Patient ID: John Peck,  MRN: [id number], DOB/AGE: 1924/01/07 83 y.o.  Admit date: 02/23/2017 Discharge date: 02/26/2017  Primary Care Provider: Pcp Not In System Primary Cardiologist: Dr. Claiborne Billings   Discharge Diagnoses    Active Problems:   Unstable angina ([place])   PAF  ? ICM  CKD stage III  HLD  S/p PPM  Allergies No Known Allergies  Diagnostic Studies/Procedures    Coronary Stent Intervention   02/24/17  Left Heart Cath and Cors/Grafts Angiography     Ost LM to LM lesion, 20 %stenosed.  2nd Mrg lesion, 40 %stenosed.  Mid LAD lesion, 100 %stenosed.  LIMA.  Origin lesion, 100 %stenosed.  Acute Mrg lesion, 90 %stenosed.  Prox RCA-2 lesion, 10 %stenosed.  Dist RCA lesion, 0 %stenosed.  A drug eluting .  Prox RCA-1 lesion, 40 %stenosed.  Ost LAD lesion, 50 %stenosed.  Mid Cx lesion, 30 %stenosed.  Ost 1st Diag to 1st Diag lesion, 30 %stenosed.  There is mild left ventricular systolic dysfunction.  LV end diastolic pressure is mildly elevated.  The left ventricular ejection fraction is 45-50% by visual estimate.  A STENT RESOLUTE ONYX 2.5X18 drug eluting stent was successfully placed.  Prox Cx lesion, 80 %stenosed.  Post intervention, there is a 0% residual stenosis.  1. Significant native three-vessel coronary artery disease with occluded mid LAD. Patent LIMA to LAD and patent stents in the right coronary artery. Progression of proximal left circumflex disease with known occluded SVG graft to OM. 2. Mildly reduced LV systolic function and mildly elevated left ventricular end-diastolic pressure. 3. Successful angioplasty and drug-eluting stent placement to the proximal left circumflex extending all the way back to the ostium.  Recommendations: Dual antiplatelet therapy for at least one year and preferably indefinitely. The patient was noted to have episodes of paroxysmal tachycardia throughout the case likely atrial  fibrillation versus atrial tachycardia with a heart rate up to 110 bpm. This might be also responsible for some of his anginal symptoms. I switched amlodipine to diltiazem for better rate control.  Difficult cath via the left radial artery due to left radial loop and left subclavian artery tortuosity.        Post-Intervention Diagram       Coronary Angiography  02/25/17  Conclusion    Complicated coronary only procedure due to marked tortuosity within the subclavian and innominate artery preventing catheter control and advancement.  Right coronary is unchanged.  The native left coronary demonstrates wide patency of the stent implanted yesterday.  Chest pain and elevated troponin will need to be further investigated. It is possible that the elevated troponin is a residual of the PCI performed yesterday. The current chest pain complaint is vague and according to the patient, present for quite some time before admission to the hospital.      History of Present Illness     83 y.o.malewho presentedto the ER on 4/17/18for progressively worsening chest pain. Hx of PAD, extensive CAD s/p CABG and subsequent interventions, iron deficiency anemia, hypertension, BPH, status post TURP in 1992, remote history of liver abscess, history of COPD.  Hospital Course     Consultants: None  1. Unstable angina:  - He was admitted and started on IV nitro (held Imdur) and IV morphine. Initial troponin x 3 negative prior to cath 4/18--> s/p DES stent to LCX proximal stenosis extending to the ostium. Discontinue IV nitro and started back on home dose of Imdur 30mg . However developed  intermittent chest tightness and burning sensation. Repeat EKG with no acute findings. Follow up troponin resulted at 12.4. Started back on IV nitro and Heparin without bolus. Decision made to bring back for relook cardiac cath  02/25/17 which showed patent Lcx stent with stable anatomy. Complicated coronary only  procedure due to marked tortuosity within the subclavian and innominate artery preventing catheter control and advancement. Trended down troponin.   - Patient did had bursts of rapid atrial tachycardia during cath 4/18, suspect these episodes had contributed to his recurrent chest pain which now resolved.  Continue ASA, Plavix, BB, statin,  Imdur, and Renexa.   2. ICM ? - Cath on 4/18 showed mildly reduced LV systolic function and mildly elevated left ventricular end-diastolic pressure.BNP of 530 on presentation. Net I & O negative 1.5L. Weight down 6lb 167-->161lb. - Last echo 11/2015 showed LV EF of 50-55%. He is euvolemic currently. No dyspnea. Further work up per Dr. Claiborne Billings as outpatient if recommended.   3. PAF:  - AV pacing, but had bursts of rapid atrial tachycardia during cath as above (SVT noted at initial cath, not AF), suspect these episodes had contributed to his increasing recent chest pain. On carvedilol 25 mg bid; amlodipine dc'd and change to diltiazem-->no recurrent tachycardia. Up -titrated cardizem.  -Not on anticoagulation due to age. Did not recommended triple therapy by Dr.Kelly. CHADSVASc score of 4 (age, HTN and vascular disease).   4. Hyperlipidemia - 02/24/2017: Cholesterol 224; HDL 52; LDL Cholesterol 160; Triglycerides 58; VLDL 12 - Increased Lipitor to 80mg  this admission. Consider repeat lipid panel and LFTs in 6 weeks.   5. CKD stage 3 - Stable renal function   6. Symptomatic bradycardia - s/p pacemaker, followed by Dr. Lovena Le. AV pacing.    Patient seen by PT for deconditioning and recommended HHPT.   The patient has been seen by Dr. Claiborne Billings  today and deemed ready for discharge home. All follow-up appointments have been scheduled. Discharge medications are listed below.  _____________   Discharge Vitals Blood pressure (!) 106/52, pulse 65, temperature 98.2 F (36.8 C), temperature source Oral, resp. rate 18, height 5\' 6"  (1.676 m), weight 161 lb  13.1 oz (73.4 kg), SpO2 95 %.  Filed Weights   02/24/17 0439 02/25/17 0225 02/26/17 0415  Weight: 159 lb 9.6 oz (72.4 kg) 151 lb 10.8 oz (68.8 kg) 161 lb 13.1 oz (73.4 kg)    Labs & Radiologic Studies     CBC  Recent Labs  02/25/17 1941 02/26/17 0458  WBC 7.3 7.5  HGB 11.7* 11.0*  HCT 33.9* 33.1*  MCV 92.1 92.5  PLT 168 789   Basic Metabolic Panel  Recent Labs  02/25/17 0310 02/25/17 1941 02/26/17 1006  NA 136  --  134*  K 3.5  --  4.5  CL 101  --  102  CO2 23  --  23  GLUCOSE 120*  --  142*  BUN 21*  --  24*  CREATININE 1.24 1.25* 1.36*  CALCIUM 8.9  --  8.7*   Liver Function Tests  Recent Labs  02/23/17 1515  AST 30  ALT 14*  ALKPHOS 82  BILITOT 1.8*  PROT 6.7  ALBUMIN 3.4*   No results for input(s): LIPASE, AMYLASE in the last 72 hours. Cardiac Enzymes  Recent Labs  02/24/17 0343 02/25/17 1439 02/26/17 1006  TROPONINI 0.03* 12.48* 6.95*   BNP Invalid input(s): POCBNP D-Dimer No results for input(s): DDIMER in the last 72 hours. Hemoglobin A1C No results  for input(s): HGBA1C in the last 72 hours. Fasting Lipid Panel  Recent Labs  02/24/17 0343  CHOL 224*  HDL 52  LDLCALC 160*  TRIG 58  CHOLHDL 4.3   Thyroid Function Tests No results for input(s): TSH, T4TOTAL, T3FREE, THYROIDAB in the last 72 hours.  Invalid input(s): FREET3  Dg Chest 2 View  Result Date: 02/23/2017 CLINICAL DATA:  Substernal chest pain for 1 month EXAM: CHEST  2 VIEW COMPARISON:  02/12/2017 FINDINGS: Cardiac shadow is stable. Postsurgical changes are again seen. Pacing device is again noted and stable. Extrinsic lead is noted over the right upper lobe. No focal infiltrate or sizable effusion is seen. No acute bony abnormality is noted. IMPRESSION: No active cardiopulmonary disease. Electronically Signed   By: Inez Catalina M.D.   On: 02/23/2017 11:05   Dg Chest 2 View  Result Date: 02/12/2017 CLINICAL DATA:  Shortness of breath.  Cough. EXAM: CHEST  2 VIEW  COMPARISON:  08/28/2016 .  11/27/2015.  10/02/2015. FINDINGS: Cardiac pacer with lead tips in right atrium right ventricle. Prior CABG. Heart size normal. Left base pleuroparenchymal thickening again noted consistent with scarring. No pleural effusion or pneumothorax. IMPRESSION: 1. Cardiac pacer noted with lead tips in the right atrium and right ventricle. Prior CABG. Heart size normal. 2. Left base pleuroparenchymal thickening consistent with scarring. Electronically Signed   By: Marcello Moores  Register   On: 02/12/2017 12:55    Disposition   Pt is being discharged home today in good condition.  Follow-up Plans & Appointments    Follow-up Information    [facility] Follow up.   Specialty:  Home Health Services Why:  HHPT  Contact information: [place] [address] [phone]        Shelva Majestic, MD. Go on 03/08/2017.   Specialty:  Cardiology Why:  @11 :00 am for post hospital  Contact information: [address] [place] [address] [phone]          Discharge Instructions    Amb Referral to Cardiac Rehabilitation    Complete by:  As directed    Diagnosis:   Coronary Stents PTCA     Call MD for:  redness, tenderness, or signs of infection (pain, swelling, redness, odor or green/yellow discharge around incision site)    Complete by:  As directed    Diet - low sodium heart healthy    Complete by:  As directed    Discharge instructions    Complete by:  As directed    No driving for 48 hours. No lifting over 5 lbs for 1 week. No sexual activity for 1 week. You  Keep procedure site clean & dry. If you notice increased pain, swelling, bleeding or pus, call/return!  You may shower, but no soaking baths/hot tubs/pools for 1 week.   Increase activity slowly    Complete by:  As directed       Discharge Medications   Current Discharge Medication List    START taking these medications   Details  diltiazem (CARDIZEM CD)  180 MG 24 hr capsule Take 1 capsule (180 mg total) by mouth daily. Qty: 30 capsule, Refills: 6    docusate sodium (COLACE) 100 MG capsule Take 1 capsule (100 mg total) by mouth daily as needed for mild constipation. Qty: 10 capsule, Refills: 0      CONTINUE these medications which have CHANGED   Details  isosorbide mononitrate (IMDUR) 30 MG 24 hr tablet Take 1 tablet (  30 mg total) by mouth daily. Qty: 30 tablet, Refills: 3      CONTINUE these medications which have NOT CHANGED   Details  aspirin 81 MG tablet Take 81 mg by mouth daily.     atorvastatin (LIPITOR) 40 MG tablet Take 1 tablet (40 mg total) by mouth daily. Qty: 30 tablet, Refills: 6    carvedilol (COREG) 25 MG tablet Take 25 mg by mouth 2 (two) times daily with a meal.      clopidogrel (PLAVIX) 75 MG tablet Take 75 mg by mouth daily at 3 pm.     doxycycline (VIBRA-TABS) 100 MG tablet Take 100 mg by mouth 2 (two) times daily. Started on 02-19-17 for 7 days    ferrous sulfate 325 (65 FE) MG tablet Take 325 mg by mouth 3 (three) times daily with meals.     furosemide (LASIX) 20 MG tablet Take 20 mg by mouth daily.    latanoprost (XALATAN) 0.005 % ophthalmic solution Place 1 drop into both eyes daily. Refills: 4    levothyroxine (SYNTHROID, LEVOTHROID) 125 MCG tablet TAKE 1 TABLET BY MOUTH EVERY DAY BEFORE BREAKFAST Qty: 30 tablet, Refills: 0    lubiprostone (AMITIZA) 24 MCG capsule Take 24 mcg by mouth daily as needed. For bowel movement    Menthol, Topical Analgesic, (ICY HOT EX) Apply 1 application topically daily as needed (aches and pains).    pantoprazole (PROTONIX) 40 MG tablet Take 40 mg by mouth 2 (two) times daily.    ranolazine (RANEXA) 1000 MG SR tablet Take 1 tablet (1,000 mg total) by mouth 2 (two) times daily. Qty: 180 tablet, Refills: 1    tamsulosin (FLOMAX) 0.4 MG CAPS capsule Take 0.4 mg by mouth daily. Refills: 2    nitroGLYCERIN (NITROSTAT) 0.4 MG SL tablet PLACE 1 TABLET UNDER THE TONGUE AS  NEEDED. Qty: 25 tablet, Refills: 3      STOP taking these medications     amLODipine (NORVASC) 2.5 MG tablet            Outstanding Labs/Studies   Lipid panel and LFTs in 6 weeks.   Duration of Discharge Encounter   Greater than 30 minutes including physician time.  Signed, Kyen Taite PA-C 02/26/2017, 12:47 PM

## 2017-02-26 NOTE — Progress Notes (Signed)
CARDIAC REHAB PHASE I   PRE:  Rate/Rhythm: 65 dual pacing     BP: sitting 137/52    SaO2: 95 RA  MODE:  Ambulation: 200 ft   POST:  Rate/Rhythm: 76 paicng    BP: sitting 132/61     SaO2: 98 RA  Pt denies CP this am, sts he feels great. Unfortunately pt was very weak. He could not get to EOB without max assist x1 (pulling pad). Pt stood and urinated without assist then ambulated with RW. However several times he veered left for no reason and struggled to turn right when he needed to. Also he kept farther away from [place] and could not correct himself when asked. He required assist x2 from room door to recliner due to significant weakness. Pt able to lift arms and legs equally. Suggest PT c/s as his weakness is quite different from yesterdays walk. No CP today however. Left on chair alarm in recliner. [place], ACSM 02/26/2017 9:01 AM

## 2017-02-26 NOTE — Evaluation (Signed)
Physical Therapy Evaluation Patient Details Name: MUSSA GROESBECK MRN: [id number] DOB: [birth year] Today's Date: 02/26/2017   History of Present Illness  OIVA DIBARI is a 83 y.o. male who presents to the ER today for progressively worsening chest pain. Hx of PAD, extensive  CAD s/p CABG and subsequent interventions,  iron deficiency anemia, hypertension, BPH, status post TURP in 1992, remote history of liver abscess, history of COPD.  Now s/p L heart cath with PCI to L Cx..  Clinical Impression  Patient presents with mild deconditioning due to hospitalization and dx.  Feel he will benefit from follow up [place] as previously walked with cane independently and currently needing minguard assist with RW.  Also feel could benefit from cardiac rehab once stable following HHPT and encouraged same with pt.     Follow Up Recommendations Home health PT    Equipment Recommendations  Other (comment) (shower chair)    Recommendations for Other Services       Precautions / Restrictions Precautions Precautions: Fall Restrictions Weight Bearing Restrictions: Yes LUE Weight Bearing: Non weight bearing      Mobility  Bed Mobility               General bed mobility comments: pt up in chair  Transfers Overall transfer level: Needs assistance Equipment used: Rolling walker (2 wheeled) Transfers: Sit to/from Stand Sit to Stand: Min guard         General transfer comment: slow to rise, assist for balance  Ambulation/Gait Ambulation/Gait assistance: Min guard;Supervision Ambulation Distance (Feet): 180 Feet Assistive device: Rolling walker (2 wheeled) (and trial with wall rail to simulate cane) Gait Pattern/deviations: Step-through pattern;Trunk flexed;Decreased stride length;Shuffle     General Gait Details: demonstrated crouch posture, improved some with cues for forward gaze and proximity to walker, attempted with wall railing to simulate cane with noted instability and  increased crouch gait,  Fatiuged with ambulation and noted mild dyspnea, HR 79, SpO2 93% on RA  Stairs            Wheelchair Mobility    Modified Rankin (Stroke Patients Only)       Balance Overall balance assessment: Needs assistance   Sitting balance-Leahy Scale: Good     Standing balance support: Bilateral upper extremity supported Standing balance-Leahy Scale: Poor Standing balance comment: some UE support needed for balance                             Pertinent Vitals/Pain Pain Assessment: No/denies pain    Home Living Family/patient expects to be discharged to:: Private residence Living Arrangements: Spouse/significant other Available Help at Discharge: Family;Available 24 hours/day Type of Home: House Home Access: Stairs to enter   [employer] of Steps: 1 Home Layout: Two level;Able to live on main level with bedroom/bathroom Home Equipment: Walker - 4 wheels;Cane - single point;Bedside commode      Prior Function Level of Independence: Independent with assistive device(s)         Comments: used cane PTA     Hand Dominance   Dominant Hand: Right    Extremity/Trunk Assessment   Upper Extremity Assessment Upper Extremity Assessment: Generalized weakness    Lower Extremity Assessment Lower Extremity Assessment: Generalized weakness    Cervical / Trunk Assessment Cervical / Trunk Assessment: Kyphotic  Communication   Communication: No difficulties  Cognition Arousal/Alertness: Awake/alert Behavior During Therapy: WFL for tasks assessed/performed Overall Cognitive Status: Within Functional Limits for tasks assessed  General Comments General comments (skin integrity, edema, etc.): educated to use rollator at home and for HHPT; though encouraged to trial cardiac rehab when finished with [facility]    Exercises     Assessment/Plan    PT Assessment All further PT needs  can be met in the next venue of care  PT Problem List Decreased strength;Decreased activity tolerance;Decreased balance;Decreased knowledge of use of DME;Decreased safety awareness       PT Treatment Interventions      PT Goals (Current goals can be found in the Care Plan section)  Acute Rehab PT Goals PT Goal Formulation: All assessment and education complete, DC therapy    Frequency     Barriers to discharge        Co-evaluation               End of Session Equipment Utilized During Treatment: Gait belt Activity Tolerance: Patient tolerated treatment well Patient left: in chair;with call bell/phone within reach;with chair alarm set   PT Visit Diagnosis: Muscle weakness (generalized) (M62.81);Unsteadiness on feet (R26.81)    Time: 4982-6415 PT Time Calculation (min) (ACUTE ONLY): 29 min   Charges:   PT Evaluation $PT Eval Moderate Complexity: 1 Procedure PT Treatments $Gait Training: 8-22 mins   PT G CodesMagda Kiel, [place] [phone] 02/26/2017   Reginia Naas 02/26/2017, 11:24 AM

## 2017-02-26 NOTE — Progress Notes (Signed)
Interventional Cardiology  Needs BMET today since was exposed to contrast yesterday with a rising creatinine.

## 2017-02-26 NOTE — Care Management Note (Signed)
Case Management Note  Patient Details  Name: FARREL GUIMOND MRN: [id number] Date of Birth: June 22, 1924  Subjective/Objective:    s/p coronary stent intervention, will be on plavix and asa, per pt eval rec HHPT, wife chose [place] for HHPT, referral made to [place] , patient for dc today.  Soc will begin 24-48 hrs post dc.  Patient has a rolling walker , rollator and a w/chair.  He has medication coverage and he has a PCP Dr. Dixie Dials.  She will go to [employer] to get shower chair.                    Action/Plan:   Expected Discharge Date:                  Expected Discharge Plan:  [place]  In-House Referral:     Discharge planning Services  CM Consult  Post Acute Care Choice:  Home Health Choice offered to:  Patient, Spouse  DME Arranged:    DME Agency:     HH Arranged:  PT HH Agency:  [facility]  Status of Service:  Completed, signed off  If discussed at [place] of Stay Meetings, dates discussed:    Additional Comments:  Zenon Mayo, RN 02/26/2017, 11:44 AM

## 2017-03-01 ENCOUNTER — Telehealth: Payer: Self-pay | Admitting: Cardiovascular Disease

## 2017-03-01 ENCOUNTER — Telehealth (HOSPITAL_COMMUNITY): Payer: Self-pay

## 2017-03-01 NOTE — Telephone Encounter (Signed)
Acknowledged.  Will make primary aware.

## 2017-03-01 NOTE — Telephone Encounter (Signed)
New message    Roselie Awkward stated pt refused start of care for today because of a church activity and that it will begin tomorrow

## 2017-03-01 NOTE — Telephone Encounter (Signed)
Patient insurance is active and benefits verified. Patient has [employer] - $45.00 co-payment, no deductible, out of pocket max $4500/$218.02 has been met, no co-insurance no pre-authorization and no limit on visit. Passport/reference [id number].  Referral is on hold - Patient is scheduled to receive in home PT services per discharge summary. Patient can't receive PT services and participate in Cardiac Rehab at the same time. Insurance will not cover both services at the same time.

## 2017-03-02 ENCOUNTER — Telehealth: Payer: Self-pay | Admitting: Cardiovascular Disease

## 2017-03-02 NOTE — Telephone Encounter (Signed)
New Message   John Peck verbalized that he is calling for rn   Pt has refused care, will try again tomorrow

## 2017-03-02 NOTE — Telephone Encounter (Signed)
Left a message with Simona Huh, physical therapist. Will route to the physician for his knowledge.

## 2017-03-03 DIAGNOSIS — I200 Unstable angina: Secondary | ICD-10-CM | POA: Diagnosis not present

## 2017-03-03 DIAGNOSIS — M6281 Muscle weakness (generalized): Secondary | ICD-10-CM | POA: Diagnosis not present

## 2017-03-03 DIAGNOSIS — I2510 Atherosclerotic heart disease of native coronary artery without angina pectoris: Secondary | ICD-10-CM | POA: Diagnosis not present

## 2017-03-03 DIAGNOSIS — I480 Paroxysmal atrial fibrillation: Secondary | ICD-10-CM | POA: Diagnosis not present

## 2017-03-03 DIAGNOSIS — I10 Essential (primary) hypertension: Secondary | ICD-10-CM | POA: Diagnosis not present

## 2017-03-04 ENCOUNTER — Other Ambulatory Visit: Payer: Self-pay | Admitting: Cardiovascular Disease

## 2017-03-04 NOTE — Telephone Encounter (Signed)
f/u Message   John Peck from [facility] call requesting to speak with RN about getting Verbal orders. Please call back to discuss

## 2017-03-04 NOTE — Telephone Encounter (Signed)
Returned call to [employer] verbal orders to continue HH/PT for 4 weeks, 2x/weekly.    Will route to MD for approval.

## 2017-03-05 DIAGNOSIS — I10 Essential (primary) hypertension: Secondary | ICD-10-CM | POA: Diagnosis not present

## 2017-03-05 DIAGNOSIS — I2510 Atherosclerotic heart disease of native coronary artery without angina pectoris: Secondary | ICD-10-CM | POA: Diagnosis not present

## 2017-03-05 DIAGNOSIS — I480 Paroxysmal atrial fibrillation: Secondary | ICD-10-CM | POA: Diagnosis not present

## 2017-03-05 DIAGNOSIS — M6281 Muscle weakness (generalized): Secondary | ICD-10-CM | POA: Diagnosis not present

## 2017-03-05 DIAGNOSIS — I200 Unstable angina: Secondary | ICD-10-CM | POA: Diagnosis not present

## 2017-03-08 ENCOUNTER — Telehealth: Payer: Self-pay | Admitting: Cardiovascular Disease

## 2017-03-08 ENCOUNTER — Encounter: Payer: Self-pay | Admitting: Cardiovascular Disease

## 2017-03-08 ENCOUNTER — Ambulatory Visit (INDEPENDENT_AMBULATORY_CARE_PROVIDER_SITE_OTHER): Payer: Medicare HMO | Admitting: Cardiovascular Disease

## 2017-03-08 VITALS — BP 160/76 | HR 60 | Ht 69.0 in | Wt 172.0 lb

## 2017-03-08 DIAGNOSIS — I25110 Atherosclerotic heart disease of native coronary artery with unstable angina pectoris: Secondary | ICD-10-CM | POA: Diagnosis not present

## 2017-03-08 DIAGNOSIS — I480 Paroxysmal atrial fibrillation: Secondary | ICD-10-CM | POA: Diagnosis not present

## 2017-03-08 DIAGNOSIS — I2510 Atherosclerotic heart disease of native coronary artery without angina pectoris: Secondary | ICD-10-CM | POA: Diagnosis not present

## 2017-03-08 DIAGNOSIS — I10 Essential (primary) hypertension: Secondary | ICD-10-CM | POA: Diagnosis not present

## 2017-03-08 DIAGNOSIS — E785 Hyperlipidemia, unspecified: Secondary | ICD-10-CM | POA: Diagnosis not present

## 2017-03-08 MED ORDER — ISOSORBIDE MONONITRATE ER 60 MG PO TB24: 60.0000 mg | ORAL_TABLET | Freq: Every day | ORAL | 3 refills | Status: DC

## 2017-03-08 MED ORDER — AMLODIPINE BESYLATE 5 MG PO TABS: 5.0000 mg | ORAL_TABLET | Freq: Every day | ORAL | 6 refills | Status: DC

## 2017-03-08 NOTE — Patient Instructions (Addendum)
Your physician has recommended you make the following change in your medication:   1.) the isosorbide has been increased from 30 mg to 60 mg daily. ( you can take #2 of the 30 mg together until gone. Then start the new 60 mg prescription)  2.) start new amlodipine 5 mg prescription.  3.) STOP the diltiazem.  Your doctor recommends that you wear compression stockings to help with any swelling you might have.  Your physician recommends that you schedule a follow-up appointment in: 4 weeks with Dr Claiborne Billings.

## 2017-03-08 NOTE — Progress Notes (Signed)
Patient ID: John [place], male   DOB: 03-01-1924, 83 y.o.   MRN: [id number]      HPI: John Peck is a 83 y.o. male who presents to the office today for a  follow-up cardiology evaluation following his recent hospitalization, cardiac catheterization and PCI.  [name] has  CAD and underwent CABG surgery with LIMA to the LAD, a vein to the circumflex in 1989.  In 2004 he underwent stenting to his RCA. Cardiac catheterization in January 2013 showed an ejection fraction of 50% with mild inferior, distal inferior and apical lateral hypocontractility. He had significant native CAD with 80% stenosis of the LAD after the second diagonal vessel and septal perforator artery with total occlusion of the mid LAD. He was 41 - 70% ostial stenosis in the circumflex vessel followed by 40% proximal stenosis and 7080% distal stenosis. He had an occluded Y graft which previously sequentially supplied the intermediate and distal circumflex.There was a patent stented proximal RCA with 90% stenosis in the anterior RV marginal branch arising from the mid RCA and had 70-80% stenosis in the distal RCA beyond the crux with 78% stenosis the PDA takeoff. His LIMA to LAD was patent. He has been on medical therapy. He has a history of atrial fibrillation, sick sinus syndrome and status post permanent pacemaker in 2009 which is a St. Jude device. Additional problems include iron deficiency anemia, hypertension, BPH, status post TURP in 1992, remote history of liver abscess, history of COPD.  In March 2015 due to recurrent increasing symptoms of chest pain he underwent repeat cardiac catheterization which revealed mild LV dysfunction with an EF of 45% inferior wall hypocontractility. There was significant multivessel native coronary obstructive disease with 50% ostial LAD stenosis followed by 80% proximal stenosis in the region of the first diagonal takeoff followed by 80% stenosis after the second diagonal vessel; total  occlusion of the ramus intermediate vessel with evidence for bidirectional retrograde collateralization; 40% ostial left circumflex stenosis; and widely patent proximal RCA stent with 80% stenosis in the anterior RV marginal branch, 40% mid or CVA stenosis, 50% distal, and 50% ostial and mid PDA stenoses. He had a patent LIMA graft supplying the mid LAD and an occluded Y graft which previously supplied the intermediate and distal circumflex.  He has been on increased medical regimen consisting of aspirin, Plavix, carvedilol 25 mg twice a day, high dose oral nitrate therapy, Ranexa 1000 g twice a day.  Amlodipine 10 mg in addition to Crestor 20 mg.  He also is taking Lasix 20 mg with supplemental potassium.  He was seen in the office by John Peck and admitted to bilateral lower extremity pain and weakness.  His blood pressure was somewhat low and his amlodipine dose was reduced from 10 to 5 mg. Lower extremity Doppler studies showed an ABI of 0.5 on the right and 0.58 on the left.  He had mild dilatation of his distal abdominal aorta.  There was equal to less than 50% diameter reduction.  The right SFA and right popliteal artery and there was a greater than 60% diameter reduction in the left SFA.  However, there was one vessel runoff via the peroneal artery bilaterally.  His symptoms are worse in the right lower extremity due to his distal disease.  He has  had done well with his increased medical regimen including Ranexa, high-dose nitrate therapy, amlodipine and carvedilol.  A follow-up Doppler study in August 2015 showed ABI of 0.5 on the right and  0.58 on the left.  He has poor distal runoff.  His Doppler studies were reviewed with Dr. Gwenlyn Peck who agreed with medical therapy.  He already was on aspirin and Plavix  He was hospitalized in January 2017 with chest pain and shortness of breath.  He without for an MI.  He was treated for COPD exacerbation.  He underwent repeat cardiac catheterization by Dr.  Julianne Peck.  He was Peck to have an occluded mid LAD with presumptive filling of the distal LAD by the LIMA graft, although during this catheterization.  He was unable to selectively engage the LIMA graft.  He had mild proximal disease in the circumflex vessel.  There was a widely patent stent in the RCA, but he developed a new distal RCA stenosis for which he underwent successful intervention.  He also had 90%.  RV marginal stenosis esters [place] still notes rare to occasional episodes of chest pain which do improve following sl nitroglycerin administration.  He has had some mild blood pressure lability.   When I saw him in 2017, with his episodes of rare to occasional chest pain and blood pressure elevation  I reinitiated amlodipine at 5 mg.  when he was last seen in September 2017.  His blood pressure was improved with the addition of amlodipine.  The past several months, he denies any episodes of chest pain.  He admits to occasional episodes of shortness of breath.  He is unaware of palpitations.  He denies bleeding issues.  He has been on amlodipine at 2.5 mg, carvedilol 25 mg twice a day, isosorbide 120 mg in the morning and 60 mg at night, his internal Ranexa 1000 mg twice a day.  Has been taking furosemide 20 mg daily.  His levothyroxine dose had been increased after his TSH was elevated at 8.29 in September 2017 and on follow-up in October 2017 on 0.125 mg daily TSH was 0.87.   Since I last saw him, he was admitted to [facility] hospital with increasing episodes of chest pain.  He underwent cardiac catheterization on 02/24/2017 by Dr. Velva Peck.  His LIMA to LAD was patent.  The vein graft that had supplied the circumflex marginal vessel was occluded and he had 80% ostial circumflex stenosis.  The stents in his RCA proximally and distally were patent.  A Resolute 2.518 mm stent was successfully placed at the circumflex ostium.  He sexually developed an elevated troponin level and had chest pain in the following  day was taken back to the laboratory and repeat catheterization by Dr. Tamala Peck showed the ostial stent site to be widely patent and no new significant changes from the calf the preceding day.  He has experienced occasional episodes of chest pain since his hospital discharge, which have been nitrate responsive.  He apparently was discharged on aspirin, Plavix, diltiazem 180 mg was started but ultimately was never filled.  Due to concomitant ranolazine administration.  He has been taking isosorbide 30 mg, furosemide 20 mg, addition to his levothyroxine.  He presents for follow-up evaluation.  Past Medical History:  Diagnosis Date  . Anemia   . CAD (coronary artery disease)    a. CABG '89; b. PCI '04; c. 11/2015 Cath/PCI: LM 20ost, LAD 124m LCX 30p, OM2 40, RCA 30p, 10p ISR, 90d (3.0x12 Synergy DES), AM 90, VG->OM2 known to be 100, LIMA->LAD not injected, patent in 2015.  .Marland KitchenChronic combined systolic and diastolic CHF (congestive heart failure) [place]   . CKD (chronic kidney disease), stage III   .  H/O: GI bleed    REMOTE HISTORY  . History of COPD   . Hyperlipidemia   . Hypertensive heart disease   . LBBB (left bundle branch block)   . Pacemaker Sept 2009   St Jude  . PAF (paroxysmal atrial fibrillation) (HCC)    a. not on anticoag due to prior GIB.  . Sick sinus syndrome [facility]) Sept 2009   ST Jude PTVDP    Past Surgical History:  Procedure Laterality Date  . CARDIAC CATHETERIZATION  11/2011   EF 50%; significant native CAD w/80% stenosis of the LAD after 2nd giagonal vessel and septal perforating artery w/total occlusion of the mid left anterior descendiung.; 60-70% ostiasl stenosis in the circumflex vessel followed by 40% proximal stenosis and 70-80% distal circumflex stenosis;   . CARDIAC CATHETERIZATION N/A 11/29/2015   Procedure: Left Heart Cath and Cors/Grafts Angiography;  Surgeon: Burnell Blanks, MD;  Location: [place] CV LAB;  Service: Cardiovascular;  Laterality: N/A;  .  CARDIAC CATHETERIZATION N/A 11/29/2015   Procedure: Coronary Stent Intervention;  Surgeon: Burnell Blanks, MD;  Location: [place] CV LAB;  Service: Cardiovascular;  Laterality: N/A;  . CATARACT EXTRACTION  2004  . CORONARY ANGIOGRAPHY N/A 02/25/2017   Procedure: Coronary Angiography;  Surgeon: Belva Crome, MD;  Location: [place] CV LAB;  Service: Cardiovascular;  Laterality: N/A;  . CORONARY ANGIOPLASTY WITH STENT PLACEMENT  2004   RCA  . CORONARY ARTERY BYPASS GRAFT  1989   had LIMA to his LAD, a vein to the circumflex. In 2004 underwent stenting to his right coronary artery.  . CORONARY STENT INTERVENTION N/A 02/24/2017   Procedure: Coronary Stent Intervention;  Surgeon: Wellington Hampshire, MD;  Location: [place] [facility] LAB;  Service: Cardiovascular;  Laterality: N/A;  cfx  . HEMORRHOID SURGERY    . INSERT / REPLACE / REMOVE PACEMAKER  07/17/08   DUAL-CHAMBER; PPM-ST.JUDE MEDNET  . LEFT HEART CATH AND CORS/GRAFTS ANGIOGRAPHY N/A 02/24/2017   Procedure: Left Heart Cath and Cors/Grafts Angiography;  Surgeon: Wellington Hampshire, MD;  Location: [place] CV LAB;  Service: Cardiovascular;  Laterality: N/A;  . LEFT HEART CATHETERIZATION WITH CORONARY/GRAFT ANGIOGRAM N/A 12/10/2011   Procedure: LEFT HEART CATHETERIZATION WITH Beatrix Fetters;  Surgeon: Troy Sine, MD;  [facility];  Service: Cardiovascular;  Laterality: N/A;  . LEFT HEART CATHETERIZATION WITH CORONARY/GRAFT ANGIOGRAM N/A 01/26/2014   Procedure: LEFT HEART CATHETERIZATION WITH Beatrix Fetters;  Surgeon: Troy Sine, MD;  Location: [facility] CATH LAB;  Service: Cardiovascular;  Laterality: N/A;  . PROSTATECTOMY      Not on File  Current Outpatient Prescriptions  Medication Sig Dispense Refill  . aspirin 81 MG tablet Take 81 mg by mouth daily.     Marland Kitchen atorvastatin (LIPITOR) 40 MG tablet Take 1 tablet (40 mg total) by mouth daily. 30 tablet 6  . carvedilol (COREG) 25 MG tablet Take 25 mg by mouth  2 (two) times daily with a meal.      . clopidogrel (PLAVIX) 75 MG tablet Take 75 mg by mouth daily at 3 pm.     . docusate sodium (COLACE) 100 MG capsule Take 1 capsule (100 mg total) by mouth daily as needed for mild constipation. 10 capsule 0  . doxycycline (VIBRA-TABS) 100 MG tablet Take 100 mg by mouth 2 (two) times daily. Started on 02-19-17 for 7 days    . ferrous sulfate 325 (65 FE) MG tablet Take 325 mg by mouth 3 (three) times  daily with meals.     . furosemide (LASIX) 20 MG tablet Take 20 mg by mouth daily.    Marland Kitchen latanoprost (XALATAN) 0.005 % ophthalmic solution Place 1 drop into both eyes daily.  4  . levothyroxine (SYNTHROID, LEVOTHROID) 125 MCG tablet TAKE 1 TABLET BY MOUTH EVERY DAY BEFORE BREAKFAST 30 tablet 11  . lubiprostone (AMITIZA) 24 MCG capsule Take 24 mcg by mouth daily as needed. For bowel movement    . Menthol, Topical Analgesic, (ICY HOT EX) Apply 1 application topically daily as needed (aches and pains).    . nitroGLYCERIN (NITROSTAT) 0.4 MG SL tablet PLACE 1 TABLET UNDER THE TONGUE AS NEEDED. 25 tablet 3  . pantoprazole (PROTONIX) 40 MG tablet Take 40 mg by mouth 2 (two) times daily.    . ranolazine (RANEXA) 1000 MG SR tablet Take 1 tablet (1,000 mg total) by mouth 2 (two) times daily. 180 tablet 1  . tamsulosin (FLOMAX) 0.4 MG CAPS capsule Take 0.4 mg by mouth daily.  2  . amLODipine (NORVASC) 5 MG tablet Take 1 tablet (5 mg total) by mouth daily. 30 tablet 6  . isosorbide mononitrate (IMDUR) 60 MG 24 hr tablet Take 1 tablet (60 mg total) by mouth daily. 90 tablet 3   No current facility-administered medications for this visit.     Social History   Social History  . Marital status: Married    Spouse name: N/A  . Number of children: 5  . Years of education: N/A   Occupational History  . RETIRED Retired    Banker DRIVER   Social History Main Topics  . Smoking status: Former Smoker    Packs/day: 2.00    Years: 65.00    Types: Cigarettes  . Smokeless  tobacco: Former Systems developer    Quit date: 10/29/2008  . Alcohol use No  . Drug use: No  . Sexual activity: Not on file   Other Topics Concern  . Not on file   Social History Narrative  . No narrative on file    Family History  Problem Relation Age of Onset  . Diabetes Father   . Heart disease Sister   . Heart disease Brother   . Heart attack Brother   . Stroke Brother   . Hypertension Neg Hx     ROS General: Negative; No fevers, chills, or night sweats;  HEENT: Negative; No changes in vision or hearing, sinus congestion, difficulty swallowing Pulmonary: Negative; No cough, wheezing, shortness of breath, hemoptysis Cardiovascular: See history of present illness  Positive for claudication, right lower extremity greater than left. GI: Negative; No nausea, vomiting, diarrhea, or abdominal pain GU: Negative; No dysuria, hematuria, or difficulty voiding Musculoskeletal: Negative; no myalgias, joint pain, or weakness Hematologic/Oncology: Negative; no easy bruising, bleeding Endocrine: Negative; no heat/cold intolerance; no diabetes Neuro: Negative; no changes in balance, headaches Skin: Negative; No rashes or skin lesions Psychiatric: Negative; No behavioral problems, depression Sleep: Negative; No snoring, daytime sleepiness, hypersomnolence, bruxism, restless legs, hypnogognic hallucinations, no cataplexy Other comprehensive 14 point system review is negative.   PE BP (!) 160/76   Pulse 60   Ht '5\' 9"'  (1.753 m)   Wt 172 lb (78 kg)   BMI 25.40 kg/m    Repeat blood pressure when taken by me was 146/72.  Wt Readings from Last 3 Encounters:  03/08/17 172 lb (78 kg)  02/26/17 161 lb 13.1 oz (73.4 kg)  01/12/17 168 lb (76.2 kg)   General: Alert, oriented, no distress.  Skin:  normal turgor, no rashes HEENT: Normocephalic, atraumatic. Pupils round and reactive; sclera anicteric;no lid lag.  Nose without nasal septal hypertrophy Mouth/Parynx benign; Mallinpatti scale 3 Neck:  No JVD, no carotid bruits; normal carotid upstroke Lungs: clear to ausculatation and percussion; no wheezing or rales Chest wall: Nontender to palpation Heart: RRR, s1 s2 normal 1/6 systolic murmur; no diastolic murmur.  No S3 gallop.  No rubs, thrills or heaves.. Abdomen: Mild central adiposity ;soft, nontender; no hepatosplenomehaly, BS+; abdominal aorta nontender and not dilated by palpation. Back: No CVA tenderness Pulses 2+ upper extremity.  Bilateral femoral bruits.  Decreased pulses distally in the dorsalis pedis. Extremities: 1+ pedal edema bilaterally with ankle edema, right greater than left. no  clubbing cyanosis, Homan's sign negative  Neurologic: grossly nonfocal Psychologic: normal affect and mood.  ECG (independently read by me): AV paced rhythm with 100% capture.  Heart rate 60 bpm.  PR interval 206 ms.  01/12/2017 ECG (independently read by me): Paced rhythm with 100% capture at 61 bpm.  September 2017 ECG (independently read by me): AV paced rhythm at 70 bpm.  PR interval 206 ms.  May 2017 ECG (independently read by me): AV paced rhythm at 60 bpm.  PR interval 202 ms.  October 2016 ECG (independently read by me): AV dual paced rhythm with 100% capture.  Ventricular rate 60 bpm.  PR interval 206 ms.  April 2016 ECG  (Independently read by me): Ventricular paced rhythm at 62 bpm; unchanged  October 2015 ECG  (Independently read by me): Ventricular paced rhythm.  Unchanged from prior ECG.  LABS:  BMP Latest Ref Rng & Units 02/26/2017 02/25/2017 02/25/2017  Glucose 65 - 99 mg/dL 142(H) - 120(H)  BUN 6 - 20 mg/dL 24(H) - 21(H)  Creatinine 0.61 - 1.24 mg/dL 1.36(H) 1.25(H) 1.24  Sodium 135 - 145 mmol/L 134(L) - 136  Potassium 3.5 - 5.1 mmol/L 4.5 - 3.5  Chloride 101 - 111 mmol/L 102 - 101  CO2 22 - 32 mmol/L 23 - 23  Calcium 8.9 - 10.3 mg/dL 8.7(L) - 8.9    Hepatic Function Latest Ref Rng & Units 02/23/2017 01/14/2017 04/09/2016  Total Protein 6.5 - 8.1 g/dL 6.7 6.9 6.9    Albumin 3.5 - 5.0 g/dL 3.4(L) 3.9 4.0  AST 15 - 41 U/L '30 12 11  ' ALT 17 - 63 U/L 14(L) 8(L) 7(L)  Alk Phosphatase 38 - 126 U/L 82 88 92  Total Bilirubin 0.3 - 1.2 mg/dL 1.8(H) 0.9 0.8  Bilirubin, Direct 0.1 - 0.5 mg/dL 0.9(H) - -     CBC Latest Ref Rng & Units 02/26/2017 02/25/2017 02/25/2017  WBC 4.0 - 10.5 K/uL 7.5 7.3 5.5  Hemoglobin 13.0 - 17.0 g/dL 11.0(L) 11.7(L) 12.0(L)  Hematocrit 39.0 - 52.0 % 33.1(L) 33.9(L) 35.5(L)  Platelets 150 - 400 K/uL 174 168 195    Lab Results  Component Value Date   TSH 0.44 01/14/2017      BNP    Component Value Date/Time   PROBNP 304.0 (H) 11/12/2008 1154    Lipid Panel     Component Value Date/Time   CHOL 224 (H) 02/24/2017 0343   TRIG 58 02/24/2017 0343   HDL 52 02/24/2017 0343   CHOLHDL 4.3 02/24/2017 0343   VLDL 12 02/24/2017 0343   LDLCALC 160 (H) 02/24/2017 0343     RADIOLOGY: No results Pe[place].  IMPRESSION:  1. PAF (paroxysmal atrial fibrillation) ([place])   2. CAD in native artery   3. Coronary artery disease involving native  coronary artery of native heart with unstable angina pectoris ([place])   4. Essential hypertension   5. Hyperlipidemia with target LDL less than 70     ASSESSMENT AND PLAN: Mr. Guettler is an 83 year old gentleman who underwent initial bypass surgery in 1989 and stenting to his RCA in 2004.  He had been fairly stable on a medical regimen for his CAD and had developed recurrent chest pain symptomatology in the setting of possible COPD exacerbation in January 2017.  Repeat cardiac catheterization at that time revealed progression of his distal RCA which was successfully stented.  He continues to be on medical therapy for his CAD and has documented occluded graft which had supplied his circumflex.  He had been on amlodipine, which was started for elevated blood pressure and when I saw him previously but he had experienced bilateral pedal and ankle edema, right leg greater than left and amlodipine was reduced  to 2.5 mg daily.  He was recently hospitalized with unstable angina.  I reviewed his catheterization and intervention procedure from 02/24/2017 and the subsequent one from the following day.  He underwent successful stenting to the circumflex ostium, which remained patent on repeat evaluation, despite elevation of troponin levels.  Following the initial catheterization.  During that hospitalization, he was apparently was changed to diltiazem but this was never filled.  Due to potential interaction with her now losing.  His blood pressure today is somewhat higher and he has continued to experience some chest pain which undoubtedly may be due to his concomitant CAD.  I have again tried resuming amlodipine, particularly since he is on diuretic therapy to hopefully reduce potential for recurrent leg swelling.  I am also further titrating isosorbide to 60 mg.   ha've suggested compression stockings.  Recent laboratory has shown hyperlipidemia with LDL 160.  He reportedly is on atorvastatin.  This may need to be increased to 80 mg or Zetia added for more aggressive lipid-lowering.  I will see him in 4 weeks for reevaluation.  Time spent: 25 minutes  Troy Sine, MD, [facility]  03/10/2017 8:20 PM

## 2017-03-08 NOTE — Telephone Encounter (Signed)
Follow up      Please fax order to stop taking diltiazem for their records.  Fax order to [phone].

## 2017-03-08 NOTE — Telephone Encounter (Signed)
New message    Drug interaction between  diltiazem (CARDIZEM CD) 180 MG 24 hr capsule Take 1 capsule (180 mg total) by mouth daily   ranolazine (RANEXA) 1000 MG SR tablet Take 1 tablet (1,000 mg total) by mouth 2 (two) times daily.

## 2017-03-08 NOTE — Telephone Encounter (Signed)
Ok to continue

## 2017-03-09 ENCOUNTER — Telehealth (HOSPITAL_COMMUNITY): Payer: Self-pay

## 2017-03-09 DIAGNOSIS — M6281 Muscle weakness (generalized): Secondary | ICD-10-CM | POA: Diagnosis not present

## 2017-03-09 DIAGNOSIS — I200 Unstable angina: Secondary | ICD-10-CM | POA: Diagnosis not present

## 2017-03-09 DIAGNOSIS — I480 Paroxysmal atrial fibrillation: Secondary | ICD-10-CM | POA: Diagnosis not present

## 2017-03-09 DIAGNOSIS — I2510 Atherosclerotic heart disease of native coronary artery without angina pectoris: Secondary | ICD-10-CM | POA: Diagnosis not present

## 2017-03-09 DIAGNOSIS — I10 Essential (primary) hypertension: Secondary | ICD-10-CM | POA: Diagnosis not present

## 2017-03-09 NOTE — Telephone Encounter (Signed)
Office note regarding diltiazem faxed to the number provided

## 2017-03-09 NOTE — Telephone Encounter (Signed)
I called and spoke to patient wife about patient scheduling for Cardiac Rehab. Patient is currently still receiving PT services at home. Patient wife declined Cardiac Rehab for patient. Patient wife stated she would not be able to get him here and he is doing well with the home PT services. Referral canceled.

## 2017-03-10 DIAGNOSIS — Z961 Presence of intraocular lens: Secondary | ICD-10-CM | POA: Diagnosis not present

## 2017-03-10 DIAGNOSIS — H401212 Low-tension glaucoma, right eye, moderate stage: Secondary | ICD-10-CM | POA: Diagnosis not present

## 2017-03-10 DIAGNOSIS — H401223 Low-tension glaucoma, left eye, severe stage: Secondary | ICD-10-CM | POA: Diagnosis not present

## 2017-03-10 DIAGNOSIS — H04123 Dry eye syndrome of bilateral lacrimal glands: Secondary | ICD-10-CM | POA: Diagnosis not present

## 2017-03-11 NOTE — Telephone Encounter (Signed)
lmtcb

## 2017-03-15 ENCOUNTER — Ambulatory Visit: Payer: Medicare HMO | Admitting: Student

## 2017-03-15 DIAGNOSIS — I2510 Atherosclerotic heart disease of native coronary artery without angina pectoris: Secondary | ICD-10-CM | POA: Diagnosis not present

## 2017-03-15 DIAGNOSIS — I200 Unstable angina: Secondary | ICD-10-CM | POA: Diagnosis not present

## 2017-03-15 DIAGNOSIS — I10 Essential (primary) hypertension: Secondary | ICD-10-CM | POA: Diagnosis not present

## 2017-03-15 DIAGNOSIS — M6281 Muscle weakness (generalized): Secondary | ICD-10-CM | POA: Diagnosis not present

## 2017-03-15 DIAGNOSIS — I480 Paroxysmal atrial fibrillation: Secondary | ICD-10-CM | POA: Diagnosis not present

## 2017-03-16 DIAGNOSIS — I447 Left bundle-branch block, unspecified: Secondary | ICD-10-CM | POA: Diagnosis not present

## 2017-03-16 DIAGNOSIS — R072 Precordial pain: Secondary | ICD-10-CM | POA: Diagnosis not present

## 2017-03-16 DIAGNOSIS — M25562 Pain in left knee: Secondary | ICD-10-CM | POA: Diagnosis not present

## 2017-03-16 DIAGNOSIS — Z950 Presence of cardiac pacemaker: Secondary | ICD-10-CM | POA: Diagnosis not present

## 2017-03-16 DIAGNOSIS — E784 Other hyperlipidemia: Secondary | ICD-10-CM | POA: Diagnosis not present

## 2017-03-16 DIAGNOSIS — R0602 Shortness of breath: Secondary | ICD-10-CM | POA: Diagnosis not present

## 2017-03-16 DIAGNOSIS — M25561 Pain in right knee: Secondary | ICD-10-CM | POA: Diagnosis not present

## 2017-03-17 DIAGNOSIS — I2510 Atherosclerotic heart disease of native coronary artery without angina pectoris: Secondary | ICD-10-CM | POA: Diagnosis not present

## 2017-03-17 DIAGNOSIS — M6281 Muscle weakness (generalized): Secondary | ICD-10-CM | POA: Diagnosis not present

## 2017-03-17 DIAGNOSIS — I480 Paroxysmal atrial fibrillation: Secondary | ICD-10-CM | POA: Diagnosis not present

## 2017-03-17 DIAGNOSIS — I200 Unstable angina: Secondary | ICD-10-CM | POA: Diagnosis not present

## 2017-03-17 DIAGNOSIS — I10 Essential (primary) hypertension: Secondary | ICD-10-CM | POA: Diagnosis not present

## 2017-03-22 DIAGNOSIS — I200 Unstable angina: Secondary | ICD-10-CM | POA: Diagnosis not present

## 2017-03-22 DIAGNOSIS — I10 Essential (primary) hypertension: Secondary | ICD-10-CM | POA: Diagnosis not present

## 2017-03-22 DIAGNOSIS — I2510 Atherosclerotic heart disease of native coronary artery without angina pectoris: Secondary | ICD-10-CM | POA: Diagnosis not present

## 2017-03-22 DIAGNOSIS — I480 Paroxysmal atrial fibrillation: Secondary | ICD-10-CM | POA: Diagnosis not present

## 2017-03-22 DIAGNOSIS — M6281 Muscle weakness (generalized): Secondary | ICD-10-CM | POA: Diagnosis not present

## 2017-03-24 DIAGNOSIS — N3000 Acute cystitis without hematuria: Secondary | ICD-10-CM | POA: Diagnosis not present

## 2017-03-24 DIAGNOSIS — N35013 Post-traumatic anterior urethral stricture: Secondary | ICD-10-CM | POA: Diagnosis not present

## 2017-03-25 DIAGNOSIS — I10 Essential (primary) hypertension: Secondary | ICD-10-CM | POA: Diagnosis not present

## 2017-03-25 DIAGNOSIS — I480 Paroxysmal atrial fibrillation: Secondary | ICD-10-CM | POA: Diagnosis not present

## 2017-03-25 DIAGNOSIS — M6281 Muscle weakness (generalized): Secondary | ICD-10-CM | POA: Diagnosis not present

## 2017-03-25 DIAGNOSIS — I2510 Atherosclerotic heart disease of native coronary artery without angina pectoris: Secondary | ICD-10-CM | POA: Diagnosis not present

## 2017-03-25 DIAGNOSIS — I200 Unstable angina: Secondary | ICD-10-CM | POA: Diagnosis not present

## 2017-03-31 DIAGNOSIS — H6123 Impacted cerumen, bilateral: Secondary | ICD-10-CM | POA: Diagnosis not present

## 2017-03-31 DIAGNOSIS — H903 Sensorineural hearing loss, bilateral: Secondary | ICD-10-CM | POA: Diagnosis not present

## 2017-03-31 DIAGNOSIS — H9123 Sudden idiopathic hearing loss, bilateral: Secondary | ICD-10-CM | POA: Diagnosis not present

## 2017-04-02 ENCOUNTER — Ambulatory Visit: Payer: Medicare HMO | Admitting: Cardiovascular Disease

## 2017-04-07 ENCOUNTER — Encounter: Payer: Self-pay | Admitting: Cardiovascular Disease

## 2017-04-07 ENCOUNTER — Ambulatory Visit (INDEPENDENT_AMBULATORY_CARE_PROVIDER_SITE_OTHER): Payer: Medicare HMO | Admitting: Cardiovascular Disease

## 2017-04-07 VITALS — BP 138/70 | HR 60 | Ht 67.5 in | Wt 164.8 lb

## 2017-04-07 DIAGNOSIS — I10 Essential (primary) hypertension: Secondary | ICD-10-CM | POA: Diagnosis not present

## 2017-04-07 DIAGNOSIS — I480 Paroxysmal atrial fibrillation: Secondary | ICD-10-CM

## 2017-04-07 DIAGNOSIS — Z79899 Other long term (current) drug therapy: Secondary | ICD-10-CM

## 2017-04-07 DIAGNOSIS — I209 Angina pectoris, unspecified: Secondary | ICD-10-CM | POA: Diagnosis not present

## 2017-04-07 DIAGNOSIS — E785 Hyperlipidemia, unspecified: Secondary | ICD-10-CM

## 2017-04-07 DIAGNOSIS — M25471 Effusion, right ankle: Secondary | ICD-10-CM

## 2017-04-07 DIAGNOSIS — I2510 Atherosclerotic heart disease of native coronary artery without angina pectoris: Secondary | ICD-10-CM | POA: Diagnosis not present

## 2017-04-07 DIAGNOSIS — M25472 Effusion, left ankle: Secondary | ICD-10-CM

## 2017-04-07 DIAGNOSIS — E039 Hypothyroidism, unspecified: Secondary | ICD-10-CM

## 2017-04-07 MED ORDER — EZETIMIBE 10 MG PO TABS: 10.0000 mg | ORAL_TABLET | Freq: Every day | ORAL | 3 refills | Status: DC

## 2017-04-07 MED ORDER — ISOSORBIDE MONONITRATE ER 60 MG PO TB24: 60.0000 mg | ORAL_TABLET | Freq: Every day | ORAL | 3 refills | Status: DC

## 2017-04-07 NOTE — Patient Instructions (Signed)
Your physician has recommended you make the following change in your medication:   1.) the isosorbide has been changed to 1 tablet in the morning and 1/2 tablet at night.  2.) start new ezetimibe 10 mg prescription. This has been sent to your pharmacy.  Your physician recommends that you return for lab work and office visit in 3 months.

## 2017-04-07 NOTE — Progress Notes (Signed)
Patient ID: John Peck, male   DOB: 04-18-1924, 83 y.o.   MRN: [id number]      HPI: John Peck is a 83 y.o. male who presents to the office today for a one month follow-up cardiology evaluation.  [name] has  CAD and underwent CABG surgery with LIMA to the LAD, a vein to the circumflex in 1989.  In 2004 he underwent stenting to his RCA. Cardiac catheterization in January 2013 showed an ejection fraction of 50% with mild inferior, distal inferior and apical lateral hypocontractility. He had significant native CAD with 80% stenosis of the LAD after the second diagonal vessel and septal perforator artery with total occlusion of the mid LAD. He was 82 - 70% ostial stenosis in the circumflex vessel followed by 40% proximal stenosis and 7080% distal stenosis. He had an occluded Y graft which previously sequentially supplied the intermediate and distal circumflex.There was a patent stented proximal RCA with 90% stenosis in the anterior RV marginal branch arising from the mid RCA and had 70-80% stenosis in the distal RCA beyond the crux with 78% stenosis the PDA takeoff. His LIMA to LAD was patent. He has been on medical therapy. He has a history of atrial fibrillation, sick sinus syndrome and status post permanent pacemaker in 2009 which is a St. Jude device. Additional problems include iron deficiency anemia, hypertension, BPH, status post TURP in 1992, remote history of liver abscess, history of COPD.  In March 2015 due to recurrent increasing symptoms of chest pain he underwent repeat cardiac catheterization which revealed mild LV dysfunction with an EF of 45% inferior wall hypocontractility. There was significant multivessel native coronary obstructive disease with 50% ostial LAD stenosis followed by 80% proximal stenosis in the region of the first diagonal takeoff followed by 80% stenosis after the second diagonal vessel; total occlusion of the ramus intermediate vessel with evidence for  bidirectional retrograde collateralization; 40% ostial left circumflex stenosis; and widely patent proximal RCA stent with 80% stenosis in the anterior RV marginal branch, 40% mid or CVA stenosis, 50% distal, and 50% ostial and mid PDA stenoses. He had a patent LIMA graft supplying the mid LAD and an occluded Y graft which previously supplied the intermediate and distal circumflex.  He has been on increased medical regimen consisting of aspirin, Plavix, carvedilol 25 mg twice a day, high dose oral nitrate therapy, Ranexa 1000 g twice a day.  Amlodipine 10 mg in addition to Crestor 20 mg.  He also is taking Lasix 20 mg with supplemental potassium.  He was seen in the office by Lesia Hausen and admitted to bilateral lower extremity pain and weakness.  His blood pressure was somewhat low and his amlodipine dose was reduced from 10 to 5 mg. Lower extremity Doppler studies showed an ABI of 0.5 on the right and 0.58 on the left.  He had mild dilatation of his distal abdominal aorta.  There was equal to less than 50% diameter reduction.  The right SFA and right popliteal artery and there was a greater than 60% diameter reduction in the left SFA.  However, there was one vessel runoff via the peroneal artery bilaterally.  His symptoms are worse in the right lower extremity due to his distal disease.  He has  had done well with his increased medical regimen including Ranexa, high-dose nitrate therapy, amlodipine and carvedilol.  A follow-up Doppler study in August 2015 showed ABI of 0.5 on the right and 0.58 on the left.  He has  poor distal runoff.  His Doppler studies were reviewed with Dr. Gwenlyn Found who agreed with medical therapy.  He already was on aspirin and Plavix  He was hospitalized in January 2017 with chest pain and shortness of breath.  He without for an MI.  He was treated for COPD exacerbation.  He underwent repeat cardiac catheterization by Dr. Julianne Handler.  He was found to have an occluded mid LAD with  presumptive filling of the distal LAD by the LIMA graft, although during this catheterization.  He was unable to selectively engage the LIMA graft.  He had mild proximal disease in the circumflex vessel.  There was a widely patent stent in the RCA, but he developed a new distal RCA stenosis for which he underwent successful intervention.  He also had 90%.  RV marginal stenosis esters Oubre still notes rare to occasional episodes of chest pain which do improve following sl nitroglycerin administration.  He has had some mild blood pressure lability.   When I saw him in 2017, with his episodes of rare to occasional chest pain and blood pressure elevation  I reinitiated amlodipine at 5 mg.  when he was last seen in September 2017.  His blood pressure was improved with the addition of amlodipine.  The past several months, he denies any episodes of chest pain.  He admits to occasional episodes of shortness of breath.  He is unaware of palpitations.  He denies bleeding issues.  He has been on amlodipine at 2.5 mg, carvedilol 25 mg twice a day, isosorbide 120 mg in the morning and 60 mg at night, his internal Ranexa 1000 mg twice a day.  Has been taking furosemide 20 mg daily.  His levothyroxine dose had been increased after his TSH was elevated at 8.29 in September 2017 and on follow-up in October 2017 on 0.125 mg daily TSH was 0.87.   He was admitted to [facility] hospital with increasing episodes of chest pain.  He underwent cardiac catheterization on 02/24/2017 by Dr. Velva Harman.  His LIMA to LAD was patent.  The vein graft that had supplied the circumflex marginal vessel was occluded and he had 80% ostial circumflex stenosis.  The stents in his RCA proximally and distally were patent.  A Resolute 2.518 mm stent was successfully placed at the circumflex ostium.  He subsequently developed an elevated troponin level and had chest pain in the following day was taken back to the laboratory and repeat catheterization by Dr. Tamala Julian  showed the ostial stent site to be widely patent and no new significant changes from the calf the preceding day.  He has experienced occasional episodes of chest pain since his hospital discharge, which have been nitrate responsive.  He apparently was discharged on aspirin, Plavix, diltiazem 180 mg was started but ultimately was never filled due to concomitant ranolazine administration.  He has been taking isosorbide 30 mg, furosemide 20 mg, addition to his levothyroxine.    For initial follow-up evaluation on 03/09/1999.  He was doing well and was without recurrent chest pain.  I further titrated isosorbide to 60 mg and his blood pressure was elevated and he had continued to experience some mild chest pain.  I also recommended resumption of amlodipine.  Over the past month, he has experienced 1 additional episode of chest pain which lasted 5 minutes.  He is unaware of palpitations.  He presents for reevaluation.  Past Medical History:  Diagnosis Date  . Anemia   . CAD (coronary artery disease)  a. CABG '89; b. PCI '04; c. 11/2015 Cath/PCI: LM 20ost, LAD 139m LCX 30p, OM2 40, RCA 30p, 10p ISR, 90d (3.0x12 Synergy DES), AM 90, VG->OM2 known to be 100, LIMA->LAD not injected, patent in 2015.  .Marland KitchenChronic combined systolic and diastolic CHF (congestive heart failure[place]   . CKD (chronic kidney disease), stage III   . H/O: GI bleed    REMOTE HISTORY  . History of COPD   . Hyperlipidemia   . Hypertensive heart disease   . LBBB (left bundle branch block)   . Pacemaker Sept 2009   St Jude  . PAF (paroxysmal atrial fibrillation) (HCC)    a. not on anticoag due to prior GIB.  . Sick sinus syndrome ([facility] Sept 2009   ST Jude PTVDP    Past Surgical History:  Procedure Laterality Date  . CARDIAC CATHETERIZATION  11/2011   EF 50%; significant native CAD w/80% stenosis of the LAD after 2nd giagonal vessel and septal perforating artery w/total occlusion of the mid left anterior descendiung.; 60-70%  ostiasl stenosis in the circumflex vessel followed by 40% proximal stenosis and 70-80% distal circumflex stenosis;   . CARDIAC CATHETERIZATION N/A 11/29/2015   Procedure: Left Heart Cath and Cors/Grafts Angiography;  Surgeon: CBurnell Blanks MD;  [facility];  Service: Cardiovascular;  Laterality: N/A;  . CARDIAC CATHETERIZATION N/A 11/29/2015   Procedure: Coronary Stent Intervention;  Surgeon: CBurnell Blanks MD;  [facility];  Service: Cardiovascular;  Laterality: N/A;  . CATARACT EXTRACTION  2004  . CORONARY ANGIOGRAPHY N/A 02/25/2017   Procedure: Coronary Angiography;  Surgeon: HBelva Crome MD;  [facility];  Service: Cardiovascular;  Laterality: N/A;  . CORONARY ANGIOPLASTY WITH STENT PLACEMENT  2004   RCA  . CORONARY ARTERY BYPASS GRAFT  1989   had LIMA to his LAD, a vein to the circumflex. In 2004 underwent stenting to his right coronary artery.  . CORONARY STENT INTERVENTION N/A 02/24/2017   Procedure: Coronary Stent Intervention;  Surgeon: MWellington Hampshire MD;  [facility];  Service: Cardiovascular;  Laterality: N/A;  cfx  . HEMORRHOID SURGERY    . INSERT / REPLACE / REMOVE PACEMAKER  07/17/08   DUAL-CHAMBER; PPM-ST.JUDE MEDNET  . LEFT HEART CATH AND CORS/GRAFTS ANGIOGRAPHY N/A 02/24/2017   Procedure: Left Heart Cath and Cors/Grafts Angiography;  Surgeon: MWellington Hampshire MD;  [facility];  Service: Cardiovascular;  Laterality: N/A;  . LEFT HEART CATHETERIZATION WITH CORONARY/GRAFT ANGIOGRAM N/A 12/10/2011   Procedure: LEFT HEART CATHETERIZATION WITH CBeatrix Fetters  Surgeon: TTroy Sine MD;  [facility];  Service: Cardiovascular;  Laterality: N/A;  . LEFT HEART CATHETERIZATION WITH CORONARY/GRAFT ANGIOGRAM N/A 01/26/2014   Procedure: LEFT HEART CATHETERIZATION WITH CBeatrix Fetters  Surgeon: TTroy Sine MD;  [facility];  Service:  Cardiovascular;  Laterality: N/A;  . PROSTATECTOMY      No Known Allergies  Current Outpatient Prescriptions  Medication Sig Dispense Refill  . amLODipine (NORVASC) 5 MG tablet Take 1 tablet (5 mg total) by mouth daily. 30 tablet 6  . aspirin 81 MG tablet Take 81 mg by mouth daily.     .Marland Kitchenatorvastatin (LIPITOR) 40 MG tablet Take 1 tablet (40 mg total) by mouth daily. 30 tablet 6  . carvedilol (COREG) 25 MG tablet Take 25 mg by mouth 2 (two) times daily with a meal.      . clopidogrel (PLAVIX)  75 MG tablet Take 75 mg by mouth daily at 3 pm.     . docusate sodium (COLACE) 100 MG capsule Take 1 capsule (100 mg total) by mouth daily as needed for mild constipation. 10 capsule 0  . doxycycline (VIBRA-TABS) 100 MG tablet Take 100 mg by mouth 2 (two) times daily. Started on 02-19-17 for 7 days    . ferrous sulfate 325 (65 FE) MG tablet Take 325 mg by mouth 3 (three) times daily with meals.     . furosemide (LASIX) 20 MG tablet Take 20 mg by mouth daily.    . isosorbide mononitrate (IMDUR) 60 MG 24 hr tablet Take 1 tablet (60 mg total) by mouth daily. And 1/2 tablet at night 90 tablet 3  . latanoprost (XALATAN) 0.005 % ophthalmic solution Place 1 drop into both eyes daily.  4  . levothyroxine (SYNTHROID, LEVOTHROID) 125 MCG tablet TAKE 1 TABLET BY MOUTH EVERY DAY BEFORE BREAKFAST 30 tablet 11  . lubiprostone (AMITIZA) 24 MCG capsule Take 24 mcg by mouth daily as needed. For bowel movement    . Menthol, Topical Analgesic, (ICY HOT EX) Apply 1 application topically daily as needed (aches and pains).    . nitroGLYCERIN (NITROSTAT) 0.4 MG SL tablet PLACE 1 TABLET UNDER THE TONGUE AS NEEDED. 25 tablet 3  . pantoprazole (PROTONIX) 40 MG tablet Take 40 mg by mouth 2 (two) times daily.    . ranolazine (RANEXA) 1000 MG SR tablet Take 1 tablet (1,000 mg total) by mouth 2 (two) times daily. 180 tablet 1  . tamsulosin (FLOMAX) 0.4 MG CAPS capsule Take 0.4 mg by mouth daily.  2  . ezetimibe (ZETIA) 10 MG tablet  Take 1 tablet (10 mg total) by mouth daily. 90 tablet 3   No current facility-administered medications for this visit.     Social History   Social History  . Marital status: Married    Spouse name: N/A  . Number of children: 5  . Years of education: N/A   Occupational History  . RETIRED Retired    Banker DRIVER   Social History Main Topics  . Smoking status: Former Smoker    Packs/day: 2.00    Years: 65.00    Types: Cigarettes  . Smokeless tobacco: Former Systems developer    Quit date: 10/29/2008  . Alcohol use No  . Drug use: No  . Sexual activity: Not on file   Other Topics Concern  . Not on file   Social History Narrative  . No narrative on file    Family History  Problem Relation Age of Onset  . Diabetes Father   . Heart disease Sister   . Heart disease Brother   . Heart attack Brother   . Stroke Brother   . Hypertension Neg Hx     ROS General: Negative; No fevers, chills, or night sweats;  HEENT: Negative; No changes in vision or hearing, sinus congestion, difficulty swallowing Pulmonary: Negative; No cough, wheezing, shortness of breath, hemoptysis Cardiovascular: See history of present illness  Positive for claudication, right lower extremity greater than left. GI: Negative; No nausea, vomiting, diarrhea, or abdominal pain GU: Negative; No dysuria, hematuria, or difficulty voiding Musculoskeletal: Negative; no myalgias, joint pain, or weakness Hematologic/Oncology: Negative; no easy bruising, bleeding Endocrine: Negative; no heat/cold intolerance; no diabetes Neuro: Negative; no changes in balance, headaches Skin: Negative; No rashes or skin lesions Psychiatric: Negative; No behavioral problems, depression Sleep: Negative; No snoring, daytime sleepiness, hypersomnolence, bruxism, restless legs, hypnogognic hallucinations, no  cataplexy Other comprehensive 14 point system review is negative.   PE BP 138/70   Pulse 60   Ht 5' 7.5" (1.715 m)   Wt 164 lb 12.8  oz (74.8 kg)   BMI 25.43 kg/m    Repeat blood pressure when taken by me was 136/70  Wt Readings from Last 3 Encounters:  04/07/17 164 lb 12.8 oz (74.8 kg)  03/08/17 172 lb (78 kg)  02/26/17 161 lb 13.1 oz (73.4 kg)      Physical Exam BP 138/70   Pulse 60   Ht 5' 7.5" (1.715 m)   Wt 164 lb 12.8 oz (74.8 kg)   BMI 25.43 kg/m  General: Alert, oriented, no distress.  Skin: normal turgor, no rashes, warm and dry HEENT: Normocephalic, atraumatic. Pupils equal round and reactive to light; sclera anicteric; extraocular muscles intact;  Nose without nasal septal hypertrophy Mouth/Parynx benign; Mallinpatti scale 3 Neck: No JVD, no carotid bruits; normal carotid upstroke Lungs: clear to ausculatation and percussion; no wheezing or rales Chest wall: without tenderness to palpitation Heart: PMI not displaced, RRR, s1 s2 normal, 1/6 systolic murmur, no diastolic murmur, no rubs, gallops, thrills, or heaves Abdomen: soft, nontender; no hepatosplenomehaly, BS+; abdominal aorta nontender and not dilated by palpation. Back: no CVA tenderness Pulses 2+; bilateral femoral bruits Musculoskeletal: full range of motion, normal strength, no joint deformities Extremities: Trace bilateral edema, improved with compression socks; no clubbing cyanosis  Homan's sign negative  Neurologic: grossly nonfocal; Cranial nerves grossly wnl Psychologic: Normal mood and affect    ECG (independently read by me): AV paced with 100% capture and a ventricular rate at 60 bpm.  PR interval 202 ms.  03/08/2017 ECG (independently read by me): AV paced rhythm with 100% capture.  Heart rate 60 bpm.  PR interval 206 ms.  01/12/2017 ECG (independently read by me): Paced rhythm with 100% capture at 61 bpm.  September 2017 ECG (independently read by me): AV paced rhythm at 70 bpm.  PR interval 206 ms.  May 2017 ECG (independently read by me): AV paced rhythm at 60 bpm.  PR interval 202 ms.  October 2016 ECG  (independently read by me): AV dual paced rhythm with 100% capture.  Ventricular rate 60 bpm.  PR interval 206 ms.  April 2016 ECG  (Independently read by me): Ventricular paced rhythm at 62 bpm; unchanged  October 2015 ECG  (Independently read by me): Ventricular paced rhythm.  Unchanged from prior ECG.  LABS:  BMP Latest Ref Rng & Units 02/26/2017 02/25/2017 02/25/2017  Glucose 65 - 99 mg/dL 142(H) - 120(H)  BUN 6 - 20 mg/dL 24(H) - 21(H)  Creatinine 0.61 - 1.24 mg/dL 1.36(H) 1.25(H) 1.24  Sodium 135 - 145 mmol/L 134(L) - 136  Potassium 3.5 - 5.1 mmol/L 4.5 - 3.5  Chloride 101 - 111 mmol/L 102 - 101  CO2 22 - 32 mmol/L 23 - 23  Calcium 8.9 - 10.3 mg/dL 8.7(L) - 8.9    Hepatic Function Latest Ref Rng & Units 02/23/2017 01/14/2017 04/09/2016  Total Protein 6.5 - 8.1 g/dL 6.7 6.9 6.9  Albumin 3.5 - 5.0 g/dL 3.4(L) 3.9 4.0  AST 15 - 41 U/L _0 ALT 17 - 63 U/L 14(L) 8(L) 7(L)  Alk Phosphatase 38 - 126 U/L 82 88 92  Total Bilirubin 0.3 - 1.2 mg/dL 1.8(H) 0.9 0.8  Bilirubin, Direct 0.1 - 0.5 mg/dL 0.9(H) - -     CBC Latest Ref Rng & Units 02/26/2017 02/25/2017 02/25/2017  WBC 4.0 - 10.5 K/uL 7.5 7.3 5.5  Hemoglobin 13.0 - 17.0 g/dL 11.0(L) 11.7(L) 12.0(L)  Hematocrit 39.0 - 52.0 % 33.1(L) 33.9(L) 35.5(L)  Platelets 150 - 400 K/uL 174 168 195    Lab Results  Component Value Date   TSH 0.44 01/14/2017      BNP    Component Value Date/Time   PROBNP 304.0 (H) 11/12/2008 1154    Lipid Panel     Component Value Date/Time   CHOL 224 (H) 02/24/2017 0343   TRIG 58 02/24/2017 0343   HDL 52 02/24/2017 0343   CHOLHDL 4.3 02/24/2017 0343   VLDL 12 02/24/2017 0343   LDLCALC 160 (H) 02/24/2017 0343     RADIOLOGY: No results found.  IMPRESSION:  1. CAD in native artery   2. Angina pectoris ([place])   3. Essential hypertension   4. PAF (paroxysmal atrial fibrillation) ([place])   5. Medication management   6. Hyperlipidemia with target LDL less than 70   7. Hypothyroidism,  unspecified type   8. Ankle edema, bilateral     ASSESSMENT AND PLAN: Mr. Wasko is an 83 year old gentleman who underwent initial bypass surgery in 1989 and stenting to his RCA in 2004.  He had been fairly stable on a medical regimen for his CAD and developed recurrent chest pain symptomatology in the setting of possible COPD exacerbation in January 2017.  Repeat cardiac catheterization at that time revealed progression of his distal RCA which was successfully stented.  He continues to be on medical therapy for his CAD and has documented occluded graft which had supplied his circumflex.  He had been on amlodipine, which was started for elevated blood pressure and when I saw him previously but he had experienced bilateral pedal and ankle edema, right leg greater than left and amlodipine was reduced to 2.5 mg daily.  He was recently hospitalized with unstable angina.  He underwent repeat catheterization and PCI on 02/24/2017 with successful stenting to the circumflex ostium, which remained patent on repeat evaluation the following day, despite elevation of troponin levels.   During that hospitalization, he was apparently was changed to diltiazem but this was never filled due to potential interaction with her now losing.  When I last saw him one month ago for initial evaluation.  Posthospitalization, I increase his isosorbide to 60 mg and reinstituted amlodipine.  He has wearing compression stockings.  His edema has improved.  He has experimented one additional episode of chest pain.  I have suggested he continue to take the isosorbide 60 mg in the morning but take 30 mg at bedtime.  In attempt to be more aggressive with lipid lowering, I am adding Zetia 10 mg to his current dose of an atorvastatin with target LDL less than 70.  He continues to be on Ranexa 1000 mg twice a day.  He is hypothyroid on levothyroxine 125 g.  He is continues to be on aspirin and Plavix and should be on this indefinitely.  He  continues to take furosemide for leg edema which has improved.  I will see him in 3 months for reevaluation or sooner if problems arise.  Prior to that office visit repeat laboratory will be obtained.  Time spent: 25 minutes  [place] Sine, MD, [facility]  04/09/2017 3:07 PM

## 2017-04-12 ENCOUNTER — Ambulatory Visit (INDEPENDENT_AMBULATORY_CARE_PROVIDER_SITE_OTHER): Payer: Medicare HMO | Admitting: *Deleted

## 2017-04-12 DIAGNOSIS — I495 Sick sinus syndrome: Secondary | ICD-10-CM | POA: Diagnosis not present

## 2017-04-12 NOTE — Progress Notes (Signed)
Pacemaker check in clinic. Normal device function. Thresholds, sensing, impedances consistent with previous measurements. Device programmed to maximize longevity. 2.4% AT/AF ~ ASA81 only d/t GI bleed. No high ventricular rates noted. Device programmed at appropriate safety margins. Histogram distribution appropriate for patient activity level. Device programmed to optimize intrinsic conduction. Estimated longevity 2-2.25 years. ROV w/ GT 10/2017. Patient education completed.

## 2017-05-18 ENCOUNTER — Encounter (HOSPITAL_COMMUNITY): Payer: Self-pay

## 2017-05-18 ENCOUNTER — Emergency Department (HOSPITAL_COMMUNITY)
Admission: EM | Admit: 2017-05-18 | Discharge: 2017-05-18 | Disposition: A | Discharge status: Home / Self Care | Payer: Medicare HMO | Attending: Emergency Medicine | Admitting: Emergency Medicine

## 2017-05-18 ENCOUNTER — Emergency Department (HOSPITAL_COMMUNITY): Payer: Medicare HMO

## 2017-05-18 DIAGNOSIS — I25810 Atherosclerosis of coronary artery bypass graft(s) without angina pectoris: Secondary | ICD-10-CM | POA: Diagnosis not present

## 2017-05-18 DIAGNOSIS — N401 Enlarged prostate with lower urinary tract symptoms: Secondary | ICD-10-CM | POA: Diagnosis not present

## 2017-05-18 DIAGNOSIS — R0600 Dyspnea, unspecified: Secondary | ICD-10-CM | POA: Diagnosis present

## 2017-05-18 DIAGNOSIS — D509 Iron deficiency anemia, unspecified: Secondary | ICD-10-CM | POA: Diagnosis not present

## 2017-05-18 DIAGNOSIS — Z7982 Long term (current) use of aspirin: Secondary | ICD-10-CM | POA: Diagnosis not present

## 2017-05-18 DIAGNOSIS — N182 Chronic kidney disease, stage 2 (mild): Secondary | ICD-10-CM | POA: Diagnosis not present

## 2017-05-18 DIAGNOSIS — J449 Chronic obstructive pulmonary disease, unspecified: Secondary | ICD-10-CM | POA: Diagnosis not present

## 2017-05-18 DIAGNOSIS — Z87891 Personal history of nicotine dependence: Secondary | ICD-10-CM | POA: Insufficient documentation

## 2017-05-18 DIAGNOSIS — Z955 Presence of coronary angioplasty implant and graft: Secondary | ICD-10-CM | POA: Diagnosis not present

## 2017-05-18 DIAGNOSIS — N289 Disorder of kidney and ureter, unspecified: Secondary | ICD-10-CM | POA: Diagnosis not present

## 2017-05-18 DIAGNOSIS — Z7902 Long term (current) use of antithrombotics/antiplatelets: Secondary | ICD-10-CM | POA: Insufficient documentation

## 2017-05-18 DIAGNOSIS — R0602 Shortness of breath: Secondary | ICD-10-CM | POA: Diagnosis not present

## 2017-05-18 DIAGNOSIS — I2510 Atherosclerotic heart disease of native coronary artery without angina pectoris: Secondary | ICD-10-CM | POA: Diagnosis not present

## 2017-05-18 DIAGNOSIS — I5043 Acute on chronic combined systolic (congestive) and diastolic (congestive) heart failure: Secondary | ICD-10-CM | POA: Diagnosis not present

## 2017-05-18 DIAGNOSIS — Z951 Presence of aortocoronary bypass graft: Secondary | ICD-10-CM | POA: Insufficient documentation

## 2017-05-18 DIAGNOSIS — D649 Anemia, unspecified: Secondary | ICD-10-CM

## 2017-05-18 DIAGNOSIS — I130 Hypertensive heart and chronic kidney disease with heart failure and stage 1 through stage 4 chronic kidney disease, or unspecified chronic kidney disease: Secondary | ICD-10-CM | POA: Insufficient documentation

## 2017-05-18 DIAGNOSIS — Z950 Presence of cardiac pacemaker: Secondary | ICD-10-CM | POA: Insufficient documentation

## 2017-05-18 DIAGNOSIS — Z79899 Other long term (current) drug therapy: Secondary | ICD-10-CM | POA: Diagnosis not present

## 2017-05-18 DIAGNOSIS — R069 Unspecified abnormalities of breathing: Secondary | ICD-10-CM | POA: Diagnosis not present

## 2017-05-18 LAB — CBC WITH DIFFERENTIAL/PLATELET
Basophils Absolute: 0 10*3/uL (ref 0.0–0.1)
Basophils Relative: 0 %
EOS ABS: 0.3 10*3/uL (ref 0.0–0.7)
EOS PCT: 7 %
HCT: 33.1 % — ABNORMAL LOW (ref 39.0–52.0)
Hemoglobin: 10.9 g/dL — ABNORMAL LOW (ref 13.0–17.0)
LYMPHS ABS: 1.5 10*3/uL (ref 0.7–4.0)
Lymphocytes Relative: 29 %
MCH: 32.4 pg (ref 26.0–34.0)
MCHC: 32.9 g/dL (ref 30.0–36.0)
MCV: 98.5 fL (ref 78.0–100.0)
MONO ABS: 0.6 10*3/uL (ref 0.1–1.0)
MONOS PCT: 11 %
Neutro Abs: 2.7 10*3/uL (ref 1.7–7.7)
Neutrophils Relative %: 53 %
PLATELETS: 127 10*3/uL — AB (ref 150–400)
RBC: 3.36 MIL/uL — ABNORMAL LOW (ref 4.22–5.81)
RDW: 14.3 % (ref 11.5–15.5)
WBC: 5.1 10*3/uL (ref 4.0–10.5)

## 2017-05-18 LAB — BASIC METABOLIC PANEL
Anion gap: 9 (ref 5–15)
BUN: 27 mg/dL — AB (ref 6–20)
CALCIUM: 8.8 mg/dL — AB (ref 8.9–10.3)
CO2: 27 mmol/L (ref 22–32)
CREATININE: 1.53 mg/dL — AB (ref 0.61–1.24)
Chloride: 106 mmol/L (ref 101–111)
GFR calc Af Amer: 44 mL/min — ABNORMAL LOW (ref >60)
GFR, EST NON AFRICAN AMERICAN: 38 mL/min — AB (ref >60)
Glucose, Bld: 133 mg/dL — ABNORMAL HIGH (ref 65–99)
POTASSIUM: 4.1 mmol/L (ref 3.5–5.1)
SODIUM: 142 mmol/L (ref 135–145)

## 2017-05-18 LAB — I-STAT TROPONIN, ED: Troponin i, poc: 0.01 ng/mL (ref 0.00–0.08)

## 2017-05-18 LAB — BRAIN NATRIURETIC PEPTIDE: B NATRIURETIC PEPTIDE 5: 276.5 pg/mL — AB (ref 0.0–100.0)

## 2017-05-18 MED ORDER — ALBUTEROL SULFATE HFA 108 (90 BASE) MCG/ACT IN AERS: 2.0000 | INHALATION_SPRAY | RESPIRATORY_TRACT | 0 refills | Status: DC | PRN

## 2017-05-18 MED ORDER — FUROSEMIDE 10 MG/ML IJ SOLN
20.0000 mg | Freq: Once | INTRAMUSCULAR | Status: AC
  Administered 2017-05-18: 20 mg via INTRAVENOUS
  Filled 2017-05-18: qty 2

## 2017-05-18 MED ORDER — ALBUTEROL SULFATE (2.5 MG/3ML) 0.083% IN NEBU: 2.5000 mg | INHALATION_SOLUTION | RESPIRATORY_TRACT | 0 refills | Status: DC | PRN

## 2017-05-18 MED ORDER — DEXAMETHASONE SODIUM PHOSPHATE 10 MG/ML IJ SOLN
10.0000 mg | Freq: Once | INTRAMUSCULAR | Status: AC
  Administered 2017-05-18: 10 mg via INTRAVENOUS
  Filled 2017-05-18: qty 1

## 2017-05-18 NOTE — ED Notes (Signed)
Pt ambulated down hall with the assistance of a walker. While resting pt's Sp02 reading 96% on room air. Pt While ambulating pt sp02 dropped down to 90% on room air. Upon returning to the room pt stated he felt a little short of breath. Sp02 returned to 97% on room air while laying in the bed.

## 2017-05-18 NOTE — ED Provider Notes (Signed)
[facility] DEPT Provider Note   CSN: [id number] Arrival date & time: 05/18/17  1857     History   Chief Complaint Chief Complaint  Patient presents with  . Shortness of Breath    HPI John Peck is a 83 y.o. male.  The history is provided by the patient.  He has history of coronary artery disease, combined systolic and diastolic heart failure, chronic kidney disease, permanent pacemaker. He started having some dyspnea last night, and was awakened during the night because of dyspnea. Dyspnea has gone worse through the day. He did use his home nebulizer with some relief, but he is out of medication for his nebulizer. He denies chest pain, heaviness, tightness, pressure. He denies fever, chills, cough. He denies nausea or vomiting. He is brought in by ambulance where he was given additional albuterol, and also methylprednisolone. He feels like he is back to normal.  Past Medical History:  Diagnosis Date  . Anemia   . CAD (coronary artery disease)    a. CABG '89; b. PCI '04; c. 11/2015 Cath/PCI: LM 20ost, LAD 133m, LCX 30p, OM2 40, RCA 30p, 10p ISR, 90d (3.0x12 Synergy DES), AM 90, VG->OM2 known to be 100, LIMA->LAD not injected, patent in 2015.  Marland Kitchen Chronic combined systolic and diastolic CHF (congestive heart failure) ([place])   . CKD (chronic kidney disease), stage III   . H/O: GI bleed    REMOTE HISTORY  . History of COPD   . Hyperlipidemia   . Hypertensive heart disease   . LBBB (left bundle branch block)   . Pacemaker Sept 2009   St Jude  . PAF (paroxysmal atrial fibrillation) (HCC)    a. not on anticoag due to prior GIB.  . Sick sinus syndrome [facility]) Sept 2009   ST Jude PTVDP    Patient Active Problem List   Diagnosis Date Noted  . CAD (coronary artery disease)   . Hypertensive heart disease   . CKD (chronic kidney disease), stage II   . Hyperlipidemia   . Stented coronary artery   . Coronary artery disease involving native coronary artery of native heart with  unstable angina pectoris ([place])   . Chest pain 11/27/2015  . Reactive airway disease 11/27/2015  . Angina pectoris ([place]) 11/27/2015  . CAD in native artery 09/03/2015  . Hyperkalemia 05/09/2015  . Weakness 05/09/2015  . Acute on chronic kidney failure ([place]) 05/09/2015  . Chronic diastolic CHF (congestive heart failure) ([place]) 05/09/2015  . Hyperlipidemia LDL goal <70 02/28/2015  . Atherosclerosis of lower extremity with claudication ([place]) 07/17/2014  . Unstable angina ([place]) 01/23/2014  . PAF (paroxysmal atrial fibrillation) ([place]) 03/24/2013  . Lower extremity edema 03/24/2013  . Sinoatrial node dysfunction ([place]) 08/17/2012  . History of iron deficiency anemia 09/18/2011  . Essential hypertension 07/07/2009  . INTERMEDIATE CORONARY SYNDROME 07/07/2009  . S/P CABG x 2 '89. RCA stent'04. last cath Jan 2013 07/07/2009  . PACEMAKER, PERMANENT- St Jude Sept 2009 07/07/2009  . Sick sinus syndrome ([place]) 07/10/2008    Past Surgical History:  Procedure Laterality Date  . CARDIAC CATHETERIZATION  11/2011   EF 50%; significant native CAD w/80% stenosis of the LAD after 2nd giagonal vessel and septal perforating artery w/total occlusion of the mid left anterior descendiung.; 60-70% ostiasl stenosis in the circumflex vessel followed by 40% proximal stenosis and 70-80% distal circumflex stenosis;   . CARDIAC CATHETERIZATION N/A 11/29/2015   Procedure: Left Heart Cath and Cors/Grafts Angiography;  Surgeon: Burnell Blanks, MD;  Location: [facility] CV LAB;  Service: Cardiovascular;  Laterality: N/A;  . CARDIAC CATHETERIZATION N/A 11/29/2015   Procedure: Coronary Stent Intervention;  Surgeon: Burnell Blanks, MD;  Location: [facility] CV LAB;  Service: Cardiovascular;  Laterality: N/A;  . CATARACT EXTRACTION  2004  . CORONARY ANGIOGRAPHY N/A 02/25/2017   Procedure: Coronary Angiography;  Surgeon: Belva Crome, MD;  Location: [place] [facility] LAB;  Service: Cardiovascular;  Laterality: N/A;    . CORONARY ANGIOPLASTY WITH STENT PLACEMENT  2004   RCA  . CORONARY ARTERY BYPASS GRAFT  1989   had LIMA to his LAD, a vein to the circumflex. In 2004 underwent stenting to his right coronary artery.  . CORONARY STENT INTERVENTION N/A 02/24/2017   Procedure: Coronary Stent Intervention;  Surgeon: Wellington Hampshire, MD;  Location: [place] CV LAB;  Service: Cardiovascular;  Laterality: N/A;  cfx  . HEMORRHOID SURGERY    . INSERT / REPLACE / REMOVE PACEMAKER  07/17/08   DUAL-CHAMBER; PPM-ST.JUDE MEDNET  . LEFT HEART CATH AND CORS/GRAFTS ANGIOGRAPHY N/A 02/24/2017   Procedure: Left Heart Cath and Cors/Grafts Angiography;  Surgeon: Wellington Hampshire, MD;  Location: [facility] CV LAB;  Service: Cardiovascular;  Laterality: N/A;  . LEFT HEART CATHETERIZATION WITH CORONARY/GRAFT ANGIOGRAM N/A 12/10/2011   Procedure: LEFT HEART CATHETERIZATION WITH Beatrix Fetters;  Surgeon: Troy Sine, MD;  Location: [facility] CATH LAB;  Service: Cardiovascular;  Laterality: N/A;  . LEFT HEART CATHETERIZATION WITH CORONARY/GRAFT ANGIOGRAM N/A 01/26/2014   Procedure: LEFT HEART CATHETERIZATION WITH Beatrix Fetters;  Surgeon: Troy Sine, MD;  Location: [facility] CATH LAB;  Service: Cardiovascular;  Laterality: N/A;  . PROSTATECTOMY         Home Medications    Prior to Admission medications   Medication Sig Start Date End Date Taking? Authorizing Provider  amLODipine (NORVASC) 5 MG tablet Take 1 tablet (5 mg total) by mouth daily. 03/08/17 06/06/17  Troy Sine, MD  aspirin 81 MG tablet Take 81 mg by mouth daily.     [provider]  atorvastatin (LIPITOR) 40 MG tablet Take 1 tablet (40 mg total) by mouth daily. 09/18/15   Troy Sine, MD  carvedilol (COREG) 25 MG tablet Take 25 mg by mouth 2 (two) times daily with a meal.      [provider]  clopidogrel (PLAVIX) 75 MG tablet Take 75 mg by mouth daily at 3 pm.     [provider]  docusate sodium (COLACE) 100 MG capsule  Take 1 capsule (100 mg total) by mouth daily as needed for mild constipation. 02/26/17   Bhagat, Crista Luria, PA  doxycycline (VIBRA-TABS) 100 MG tablet Take 100 mg by mouth 2 (two) times daily. Started on 02-19-17 for 7 days 02/19/17   [provider]  ezetimibe (ZETIA) 10 MG tablet Take 1 tablet (10 mg total) by mouth daily. 04/07/17 07/06/17  Troy Sine, MD  ferrous sulfate 325 (65 FE) MG tablet Take 325 mg by mouth 3 (three) times daily with meals.     [provider]  furosemide (LASIX) 20 MG tablet Take 20 mg by mouth daily.    [provider]  isosorbide mononitrate (IMDUR) 60 MG 24 hr tablet Take 1 tablet (60 mg total) by mouth daily. And 1/2 tablet at night 04/07/17 07/06/17  Troy Sine, MD  latanoprost (XALATAN) 0.005 % ophthalmic solution Place 1 drop into both eyes daily. 11/07/15   [provider]  levothyroxine (SYNTHROID, LEVOTHROID) 125 MCG  tablet TAKE 1 TABLET BY MOUTH EVERY DAY BEFORE BREAKFAST 03/04/17   Troy Sine, MD  lubiprostone (AMITIZA) 24 MCG capsule Take 24 mcg by mouth daily as needed. For bowel movement    [provider]  Menthol, Topical Analgesic, (ICY HOT EX) Apply 1 application topically daily as needed (aches and pains).    [provider]  nitroGLYCERIN (NITROSTAT) 0.4 MG SL tablet PLACE 1 TABLET UNDER THE TONGUE AS NEEDED. 07/06/16   Troy Sine, MD  pantoprazole (PROTONIX) 40 MG tablet Take 40 mg by mouth 2 (two) times daily.    [provider]  ranolazine (RANEXA) 1000 MG SR tablet Take 1 tablet (1,000 mg total) by mouth 2 (two) times daily. 11/27/15   Troy Sine, MD  tamsulosin (FLOMAX) 0.4 MG CAPS capsule Take 0.4 mg by mouth daily. 02/26/15   [provider]    Family History Family History  Problem Relation Age of Onset  . Diabetes Father   . Heart disease Sister   . Heart disease Brother   . Heart attack Brother   . Stroke Brother   . Hypertension Neg Hx      Social History Social History  Substance Use Topics  . Smoking status: Former Smoker    Packs/day: 2.00    Years: 65.00    Types: Cigarettes  . Smokeless tobacco: Former Systems developer    Quit date: 10/29/2008  . Alcohol use No     Allergies   Patient has no known allergies.   Review of Systems Review of Systems  All other systems reviewed and are negative.    Physical Exam Updated Vital Signs BP (!) 144/67   Pulse 60   Temp 98.2 F (36.8 C) (Oral)   Resp 19   Ht 5\' 6"  (1.676 m)   Wt 73.9 kg (163 lb)   SpO2 95%   BMI 26.31 kg/m   Physical Exam  Nursing note and vitals reviewed.  83 year old male, resting comfortably and in no acute distress. Vital signs are significant for mild hypertension. Oxygen saturation is 95%, which is normal. Head is normocephalic and atraumatic. PERRLA, EOMI. Oropharynx is clear. Neck is nontender and supple without adenopathy or JVD. Back is nontender and there is no CVA tenderness. Lungs have few rales at the right base without wheezes or rhonchi. Chest is nontender. Heart has regular rate and rhythm with 2/6 systolic ejection murmur best heard at the lower left sternal border. Abdomen is soft, flat, nontender without masses or hepatosplenomegaly and peristalsis is normoactive. Extremities have trace edema, full range of motion is present. Skin is warm and dry without rash. Neurologic: Mental status is normal, cranial nerves are intact, there are no motor or sensory deficits.  ED Treatments / Results  Labs (all labs ordered are listed, but only abnormal results are displayed) Labs Reviewed  BASIC METABOLIC PANEL - Abnormal; Notable for the following:       Result Value   Glucose, Bld 133 (*)    BUN 27 (*)    Creatinine, Ser 1.53 (*)    Calcium 8.8 (*)    GFR calc non Af Amer 38 (*)    GFR calc Af Amer 44 (*)    All other components within normal limits  BRAIN NATRIURETIC PEPTIDE - Abnormal; Notable for the following:    B  Natriuretic Peptide 276.5 (*)    All other components within normal limits  CBC WITH DIFFERENTIAL/PLATELET - Abnormal; Notable for the following:  RBC 3.36 (*)    Hemoglobin 10.9 (*)    HCT 33.1 (*)    Platelets 127 (*)    All other components within normal limits  I-STAT TROPOININ, ED    EKG  EKG Interpretation  Date/Time:  Tuesday May 18 2017 19:10:11 EDT Ventricular Rate:  60 PR Interval:    QRS Duration: 175 QT Interval:  501 QTC Calculation: 501 R Axis:   -55 Text Interpretation:  AV dual-paced rhythm When compared with ECG of 02/26/2017, No significant change was found Confirmed by Delora Fuel ([id number]) on 05/18/2017 7:47:21 PM       Radiology Dg Chest 2 View  Result Date: 05/18/2017 CLINICAL DATA:  Shortness of breath, onset today. EXAM: CHEST  2 VIEW COMPARISON:  02/23/2017 FINDINGS: Left-sided pacemaking in place. Post median sternotomy. Unchanged heart size and mediastinal contours with tortuosity of the thoracic aorta. No pulmonary edema, pleural effusion, focal airspace disease or pneumothorax. Mild scarring at the left lung base. Lateral view limited by patient rotation. Remote left rib fractures. IMPRESSION: No acute abnormality. Left-sided pacemaker in place. Post median sternotomy. Electronically Signed   By: Jeb Levering M.D.   On: 05/18/2017 20:31    Procedures Procedures (including critical care time)  Medications Ordered in ED Medications  furosemide (LASIX) injection 20 mg (not administered)  dexamethasone (DECADRON) injection 10 mg (not administered)     Initial Impression / Assessment and Plan / ED Course  I have reviewed the triage vital signs and the nursing notes.  Pertinent labs & imaging results that were available during my care of the patient were reviewed by me and considered in my medical decision making (see chart for details).  Dyspnea which has resolved. He is currently maintaining good oxygen saturation on room air. Suspect CHF  exacerbation. Chest x-ray will be checked as well as BNP and troponin. Old records are reviewed confirming recent hospitalization for stent placement, long-standing AV sequential pacemaker.  ED workup is significant for mild increase in baseline creatinine, chronic anemia which is unchanged from baseline. BNP is moderately elevated, and I do feel this is the source of his dyspnea. He was ambulated in the ED with only modest drop in oxygen saturation, and not to a dangerous level. He is given a dose of furosemide in the ED and is instructed to increase his home furosemide to 40 mg a day for 3 days, and then return to his usual 20 mg a day. Since he had had an albuterol nebulizer treatment prior to my seeing him, it is possible this was COPD, so was given a dose of dexamethasone to give him adequate steroid coverage for the next several days. He is to follow-up with his PCP in 3 days. Return precautions discussed.  Final Clinical Impressions(s) / ED Diagnoses   Final diagnoses:  Acute on chronic combined systolic and diastolic congestive heart failure (HCC)  Renal insufficiency  Normochromic normocytic anemia    New Prescriptions New Prescriptions   ALBUTEROL (PROVENTIL) (2.5 MG/3ML) 0.083% NEBULIZER SOLUTION    Take 3 mLs (2.5 mg total) by nebulization every 4 (four) hours as needed for wheezing or shortness of breath.     Delora Fuel, MD 40/98/11 [id number]

## 2017-05-18 NOTE — ED Notes (Signed)
ED Provider at bedside. 

## 2017-05-18 NOTE — ED Notes (Signed)
Family updated on delay, waiting on BNP to result and provider revaluation. Pt family given ice water to drink.

## 2017-05-18 NOTE — Discharge Instructions (Signed)
Take two furosemide tablets in the morning for the next three days, then go back to taking one tablet in the morning.  Return if your breathing gets worse.

## 2017-05-18 NOTE — ED Triage Notes (Signed)
Pt from home via EMS for acute onset respiratory distress. Per EMS, pt with expiratory and inspiratory to all fields with diminished breath sounds in lower lobes. 85% on RA. Pt denies hx of asthma but says he has a prescribed albuterol inhaler that he used today with no relief. Pt given 10 mg albuterol, 1 mg atrovent, and 125 mg solumedrol en route. Pt with audible expiratory wheezes, 100% on RA at this time. Pt alert and oriented. 20 G L AC. 137/70, 70 bpm paced.

## 2017-05-18 NOTE — ED Notes (Signed)
Pt taken to xray 

## 2017-05-23 ENCOUNTER — Telehealth: Payer: Self-pay

## 2017-05-23 NOTE — Telephone Encounter (Signed)
Spoke with patient wife and she is aware of his new appt due to lt out of office  John Peck

## 2017-05-24 DIAGNOSIS — I25810 Atherosclerosis of coronary artery bypass graft(s) without angina pectoris: Secondary | ICD-10-CM | POA: Diagnosis not present

## 2017-05-24 DIAGNOSIS — J449 Chronic obstructive pulmonary disease, unspecified: Secondary | ICD-10-CM | POA: Diagnosis not present

## 2017-05-24 DIAGNOSIS — R0600 Dyspnea, unspecified: Secondary | ICD-10-CM | POA: Diagnosis not present

## 2017-06-22 ENCOUNTER — Other Ambulatory Visit: Payer: Medicare HMO

## 2017-06-22 ENCOUNTER — Ambulatory Visit: Payer: Medicare HMO | Admitting: Nurse Practitioner

## 2017-06-25 ENCOUNTER — Other Ambulatory Visit: Payer: Medicare HMO

## 2017-06-25 ENCOUNTER — Ambulatory Visit: Payer: Medicare HMO | Admitting: Nurse Practitioner

## 2017-07-23 ENCOUNTER — Encounter: Payer: Self-pay | Admitting: Internal Medicine

## 2017-07-23 DIAGNOSIS — I495 Sick sinus syndrome: Secondary | ICD-10-CM | POA: Diagnosis not present

## 2017-07-27 ENCOUNTER — Ambulatory Visit (INDEPENDENT_AMBULATORY_CARE_PROVIDER_SITE_OTHER): Payer: Medicare HMO | Admitting: Cardiovascular Disease

## 2017-07-27 ENCOUNTER — Encounter: Payer: Self-pay | Admitting: Cardiovascular Disease

## 2017-07-27 VITALS — BP 108/60 | HR 60 | Ht 67.5 in | Wt 163.0 lb

## 2017-07-27 DIAGNOSIS — M25473 Effusion, unspecified ankle: Secondary | ICD-10-CM | POA: Diagnosis not present

## 2017-07-27 DIAGNOSIS — E039 Hypothyroidism, unspecified: Secondary | ICD-10-CM | POA: Diagnosis not present

## 2017-07-27 DIAGNOSIS — I2510 Atherosclerotic heart disease of native coronary artery without angina pectoris: Secondary | ICD-10-CM

## 2017-07-27 DIAGNOSIS — I25118 Atherosclerotic heart disease of native coronary artery with other forms of angina pectoris: Secondary | ICD-10-CM | POA: Diagnosis not present

## 2017-07-27 DIAGNOSIS — E785 Hyperlipidemia, unspecified: Secondary | ICD-10-CM

## 2017-07-27 DIAGNOSIS — Z950 Presence of cardiac pacemaker: Secondary | ICD-10-CM

## 2017-07-27 MED ORDER — ISOSORBIDE MONONITRATE ER 60 MG PO TB24: ORAL_TABLET | ORAL | 3 refills | Status: DC

## 2017-07-27 NOTE — Patient Instructions (Signed)
Medication Instructions:  TAKE Isosorbide (Imdur) 90mg  (1.5 tablets) in the AM and 30mg  (1/2 tablet) in the PM  Follow-Up: Your physician recommends that you schedule a follow-up appointment in: 3-4 MONTHS with Dr. Claiborne Billings.    Any Other Special Instructions Will Be Listed Below (If Applicable).     If you need a refill on your cardiac medications before your next appointment, please call your pharmacy.

## 2017-07-27 NOTE — Progress Notes (Signed)
Patient ID: John Peck, male   DOB: [birth year], 83 y.o.   MRN: [id number]      HPI: John Peck is a 83 y.o. male who presents to the office today for a 4 month follow-up cardiology evaluation.  John Peck has  CAD and underwent CABG surgery with LIMA to the LAD, a vein to the circumflex in 1989.  In 2004 he underwent stenting to his RCA. Cardiac catheterization in January 2013 showed an ejection fraction of 50% with mild inferior, distal inferior and apical lateral hypocontractility. He had significant native CAD with 80% stenosis of the LAD after the second diagonal vessel and septal perforator artery with total occlusion of the mid LAD. He was 41 - 70% ostial stenosis in the circumflex vessel followed by 40% proximal stenosis and 7080% distal stenosis. He had an occluded Y graft which previously sequentially supplied the intermediate and distal circumflex.There was a patent stented proximal RCA with 90% stenosis in the anterior RV marginal branch arising from the mid RCA and had 70-80% stenosis in the distal RCA beyond the crux with 78% stenosis the PDA takeoff. His LIMA to LAD was patent. He has been on medical therapy. He has a history of atrial fibrillation, sick sinus syndrome and status post permanent pacemaker in 2009 which is a [employer]. Jude device. Additional problems include iron deficiency anemia, hypertension, BPH, status post TURP in 1992, remote history of liver abscess, history of COPD.  In March 2015 due to recurrent increasing symptoms of chest pain he underwent repeat cardiac catheterization which revealed mild LV dysfunction with an EF of 45% inferior wall hypocontractility. There was significant multivessel native coronary obstructive disease with 50% ostial LAD stenosis followed by 80% proximal stenosis in the region of the first diagonal takeoff followed by 80% stenosis after the second diagonal vessel; total occlusion of the ramus intermediate vessel with evidence for  bidirectional retrograde collateralization; 40% ostial left circumflex stenosis; and widely patent proximal RCA stent with 80% stenosis in the anterior RV marginal branch, 40% mid or CVA stenosis, 50% distal, and 50% ostial and mid PDA stenoses. He had a patent LIMA graft supplying the mid LAD and an occluded Y graft which previously supplied the intermediate and distal circumflex.  He has been on increased medical regimen consisting of aspirin, Plavix, carvedilol 25 mg twice a day, high dose oral nitrate therapy, Ranexa 1000 g twice a day.  Amlodipine 10 mg in addition to Crestor 20 mg.  He also is taking Lasix 20 mg with supplemental potassium.  He was seen in the office by Lesia Hausen and admitted to bilateral lower extremity pain and weakness.  His blood pressure was somewhat low and his amlodipine dose was reduced from 10 to 5 mg. Lower extremity Doppler studies showed an ABI of 0.5 on the right and 0.58 on the left.  He had mild dilatation of his distal abdominal aorta.  There was equal to less than 50% diameter reduction.  The right SFA and right popliteal artery and there was a greater than 60% diameter reduction in the left SFA.  However, there was one vessel runoff via the peroneal artery bilaterally.  His symptoms are worse in the right lower extremity due to his distal disease.  He has  had done well with his increased medical regimen including Ranexa, high-dose nitrate therapy, amlodipine and carvedilol.  A follow-up Doppler study in August 2015 showed ABI of 0.5 on the right and 0.58 on the left.  He has  poor distal runoff.  His Doppler studies were reviewed with Dr. Gwenlyn Found who agreed with medical therapy.  He already was on aspirin and Plavix  He was hospitalized in January 2017 with chest pain and shortness of breath.  He without for an MI.  He was treated for COPD exacerbation.  He underwent repeat cardiac catheterization by Dr. Julianne Handler.  He was found to have an occluded mid LAD with  presumptive filling of the distal LAD by the LIMA graft, although during this catheterization.  He was unable to selectively engage the LIMA graft.  He had mild proximal disease in the circumflex vessel.  There was a widely patent stent in the RCA, but he developed a new distal RCA stenosis for which he underwent successful intervention.  He also had 90%.  RV marginal stenosis esters [name] still notes rare to occasional episodes of chest pain which do improve following sl nitroglycerin administration.  He has had some mild blood pressure lability.   When I saw him in 2017, with his episodes of rare to occasional chest pain and blood pressure elevation  I reinitiated amlodipine at 5 mg.  when he was last seen in September 2017.  His blood pressure was improved with the addition of amlodipine.  The past several months, he denies any episodes of chest pain.  He admits to occasional episodes of shortness of breath.  He is unaware of palpitations.  He denies bleeding issues.  He has been on amlodipine at 2.5 mg, carvedilol 25 mg twice a day, isosorbide 120 mg in the morning and 60 mg at night, his internal Ranexa 1000 mg twice a day.  Has been taking furosemide 20 mg daily.  His levothyroxine dose had been increased after his TSH was elevated at 8.29 in September 2017 and on follow-up in October 2017 on 0.125 mg daily TSH was 0.87.   He was admitted to [facility] hospital with increasing episodes of chest pain.  He underwent cardiac catheterization on 02/24/2017 by Dr. Audelia Acton.  His LIMA to LAD was patent.  The vein graft that had supplied the circumflex marginal vessel was occluded and he had 80% ostial circumflex stenosis.  The stents in his RCA proximally and distally were patent.  A Resolute 2.518 mm stent was successfully placed at the circumflex ostium.  He subsequently developed an elevated troponin level and had chest pain in the following day was taken back to the laboratory and repeat catheterization by Dr.  Tamala Julian showed the ostial stent site to be widely patent and no new significant changes from the calf the preceding day.  He has experienced occasional episodes of chest pain since his hospital discharge, which have been nitrate responsive.  He apparently was discharged on aspirin, Plavix, diltiazem 180 mg was started but ultimately was never filled due to concomitant ranolazine administration.  He has been taking isosorbide 30 mg, furosemide 20 mg, addition to his levothyroxine.    For initial follow-up evaluation on 03/08/2017 he was without recurrent chest pain.  I further titrated isosorbide to 60 mg and his blood pressure was elevated and he had continued to experience some mild chest pain. I also recommended resumption of amlodipine.  When last seen in May 2018 cause of some mild symptomatology, I recommended that he take isosorbide 60 mg in the morning and added 30 mg in the evening.    Since I last saw him, on July 10 he underwent an ER evaluation for increasing shortness of breath.  His furosemide dose  was increased to 40 mg for 3 days and then he resume taking 20 mg daily.  He also reasons see a nebulizer treatment in the event some of his COPD was contributing to his symptomatology.  Over the past several months, he has felt improved.  However, he still admits to experiencing some short-lived episodes of chest tightness for which he takes nitroglycerin.  His chest pain is typically nitrate responsive and if it does not resolve it resolves with 2 nitroglycerin.  He denies any prolonged episodes of chest tightness. He presents for follow-up evaluation.  Past Medical History:  Diagnosis Date  . Anemia   . CAD (coronary artery disease)    a. CABG '89; b. PCI '04; c. 11/2015 Cath/PCI: LM 20ost, LAD 144m LCX 30p, OM2 40, RCA 30p, 10p ISR, 90d (3.0x12 Synergy DES), AM 90, VG->OM2 known to be 100, LIMA->LAD not injected, patent in 2015.  .Marland KitchenChronic combined systolic and diastolic CHF (congestive heart  failure) [place]   . CKD (chronic kidney disease), stage III   . H/O: GI bleed    REMOTE HISTORY  . History of COPD   . Hyperlipidemia   . Hypertensive heart disease   . LBBB (left bundle branch block)   . Pacemaker Sept 2009   St Jude  . PAF (paroxysmal atrial fibrillation) (HCC)    a. not on anticoag due to prior GIB.  . Sick sinus syndrome ([facility] Sept 2009   ST Jude PTVDP    Past Surgical History:  Procedure Laterality Date  . CARDIAC CATHETERIZATION  11/2011   EF 50%; significant native CAD w/80% stenosis of the LAD after 2nd giagonal vessel and septal perforating artery w/total occlusion of the mid left anterior descendiung.; 60-70% ostiasl stenosis in the circumflex vessel followed by 40% proximal stenosis and 70-80% distal circumflex stenosis;   . CARDIAC CATHETERIZATION N/A 11/29/2015   Procedure: Left Heart Cath and Cors/Grafts Angiography;  Surgeon: CBurnell Blanks MD;  [facility];  Service: Cardiovascular;  Laterality: N/A;  . CARDIAC CATHETERIZATION N/A 11/29/2015   Procedure: Coronary Stent Intervention;  Surgeon: CBurnell Blanks MD;  [place];  Service: Cardiovascular;  Laterality: N/A;  . CATARACT EXTRACTION  2004  . CORONARY ANGIOGRAPHY N/A 02/25/2017   Procedure: Coronary Angiography;  Surgeon: HBelva Crome MD;  [facility];  Service: Cardiovascular;  Laterality: N/A;  . CORONARY ANGIOPLASTY WITH STENT PLACEMENT  2004   RCA  . CORONARY ARTERY BYPASS GRAFT  1989   had LIMA to his LAD, a vein to the circumflex. In 2004 underwent stenting to his right coronary artery.  . CORONARY STENT INTERVENTION N/A 02/24/2017   Procedure: Coronary Stent Intervention;  Surgeon: MWellington Hampshire MD;  [facility];  Service: Cardiovascular;  Laterality: N/A;  cfx  . HEMORRHOID SURGERY    . INSERT / REPLACE / REMOVE PACEMAKER  07/17/08   DUAL-CHAMBER; PPM-ST.JUDE MEDNET  . LEFT HEART CATH AND CORS/GRAFTS  ANGIOGRAPHY N/A 02/24/2017   Procedure: Left Heart Cath and Cors/Grafts Angiography;  Surgeon: MWellington Hampshire MD;  [facility];  Service: Cardiovascular;  Laterality: N/A;  . LEFT HEART CATHETERIZATION WITH CORONARY/GRAFT ANGIOGRAM N/A 12/10/2011   Procedure: LEFT HEART CATHETERIZATION WITH CBeatrix Fetters  Surgeon: TTroy Sine MD;  [facility];  Service: Cardiovascular;  Laterality: N/A;  . LEFT HEART CATHETERIZATION WITH CORONARY/GRAFT ANGIOGRAM N/A 01/26/2014   Procedure: LEFT HEART CATHETERIZATION WITH CORONARY/GRAFT ANGIOGRAM;  Surgeon: Troy Sine, MD;  Location: [facility] LAB;  Service: Cardiovascular;  Laterality: N/A;  . PROSTATECTOMY      No Known Allergies  Current Outpatient Prescriptions  Medication Sig Dispense Refill  . albuterol (PROVENTIL HFA;VENTOLIN HFA) 108 (90 Base) MCG/ACT inhaler Inhale 2 puffs into the lungs every 4 (four) hours as needed for wheezing or shortness of breath (or coughing). 1 Inhaler 0  . albuterol (PROVENTIL) (2.5 MG/3ML) 0.083% nebulizer solution Take 3 mLs (2.5 mg total) by nebulization every 4 (four) hours as needed for wheezing or shortness of breath. 30 vial 0  . aspirin 81 MG tablet Take 81 mg by mouth daily.     Marland Kitchen atorvastatin (LIPITOR) 40 MG tablet Take 1 tablet (40 mg total) by mouth daily. 30 tablet 6  . carvedilol (COREG) 25 MG tablet Take 25 mg by mouth 2 (two) times daily with a meal.      . clopidogrel (PLAVIX) 75 MG tablet Take 75 mg by mouth daily at 3 pm.     . docusate sodium (COLACE) 100 MG capsule Take 1 capsule (100 mg total) by mouth daily as needed for mild constipation. 10 capsule 0  . ferrous sulfate 325 (65 FE) MG tablet Take 325 mg by mouth 2 (two) times daily with a meal.     . furosemide (LASIX) 20 MG tablet Take 20 mg by mouth daily.    Marland Kitchen latanoprost (XALATAN) 0.005 % ophthalmic solution Place 1 drop into both eyes daily.  4  . levothyroxine (SYNTHROID, LEVOTHROID) 125 MCG tablet  TAKE 1 TABLET BY MOUTH EVERY DAY BEFORE BREAKFAST 30 tablet 11  . lubiprostone (AMITIZA) 24 MCG capsule Take 24 mcg by mouth daily as needed. For bowel movement    . Menthol, Topical Analgesic, (ICY HOT EX) Apply 1 application topically daily as needed (aches and pains).    . nitroGLYCERIN (NITROSTAT) 0.4 MG SL tablet PLACE 1 TABLET UNDER THE TONGUE AS NEEDED. (Patient taking differently: PLACE 1 TABLET UNDER THE TONGUE AS NEEDED FOR CHEST PAIN.) 25 tablet 3  . pantoprazole (PROTONIX) 40 MG tablet Take 40 mg by mouth 2 (two) times daily.    . ranolazine (RANEXA) 1000 MG SR tablet Take 1 tablet (1,000 mg total) by mouth 2 (two) times daily. 180 tablet 1  . tamsulosin (FLOMAX) 0.4 MG CAPS capsule Take 0.4 mg by mouth daily.  2  . amLODipine (NORVASC) 5 MG tablet Take 1 tablet (5 mg total) by mouth daily. 30 tablet 6  . ezetimibe (ZETIA) 10 MG tablet Take 1 tablet (10 mg total) by mouth daily. 90 tablet 3  . isosorbide mononitrate (IMDUR) 60 MG 24 hr tablet Take 34m (1.5 tablets) in the AM and 393m(1/2 tablet) in the PM 90 tablet 3   No current facility-administered medications for this visit.     Social History   Social History  . Marital status: Married    Spouse name: N/A  . Number of children: 5  . Years of education: N/A   Occupational History  . RETIRED Retired    [employer]   Social History Main Topics  . Smoking status: Former Smoker    Packs/day: 2.00    Years: 65.00    Types: Cigarettes  . Smokeless tobacco: Former [employer] developer  Quit date: 10/29/2008  . Alcohol use No  . Drug use: No  . Sexual activity: Not on file   Other Topics Concern  . Not on file   Social History  Narrative  . No narrative on file    Family History  Problem Relation Age of Onset  . Diabetes Father   . Heart disease Sister   . Heart disease Brother   . Heart attack Brother   . Stroke Brother   . Hypertension Neg Hx     ROS General: Negative; No fevers, chills, or night sweats;  HEENT:  Negative; No changes in vision or hearing, sinus congestion, difficulty swallowing Pulmonary: Negative; No cough, wheezing, shortness of breath, hemoptysis Cardiovascular: See history of present illness  Positive for claudication, right lower extremity greater than left. GI: Negative; No nausea, vomiting, diarrhea, or abdominal pain GU: Negative; No dysuria, hematuria, or difficulty voiding Musculoskeletal: Negative; no myalgias, joint pain, or weakness Hematologic/Oncology: Negative; no easy bruising, bleeding Endocrine: Negative; no heat/cold intolerance; no diabetes Neuro: Negative; no changes in balance, headaches Skin: Negative; No rashes or skin lesions Psychiatric: Negative; No behavioral problems, depression Sleep: Negative; No snoring, daytime sleepiness, hypersomnolence, bruxism, restless legs, hypnogognic hallucinations, no cataplexy Other comprehensive 14 point system review is negative.   PE BP 108/60   Pulse 60   Ht 5' 7.5" (1.715 m)   Wt 163 lb (73.9 kg)   BMI 25.15 kg/m    Repeat blood pressure when taken by me was 120/62 without orthostatic change.  Wt Readings from Last 3 Encounters:  07/27/17 163 lb (73.9 kg)  05/18/17 163 lb (73.9 kg)  04/07/17 164 lb 12.8 oz (74.8 kg)   General: Alert, oriented, no distress.  Skin: normal turgor, no rashes, warm and dry HEENT: Normocephalic, atraumatic. Pupils equal round and reactive to light; sclera anicteric; extraocular muscles intact; Fundi  Nose without nasal septal hypertrophy Mouth/Parynx benign; Mallinpatti scale 3 Neck: No JVD, no carotid bruits; normal carotid upstroke Lungs: clear to ausculatation and percussion; no wheezing or rales Chest wall: without tenderness to palpitation Heart: PMI not displaced, RRR, s1 s2 normal, 1/6 systolic murmur, no diastolic murmur, no rubs, gallops, thrills, or heaves Abdomen: soft, nontender; no hepatosplenomehaly, BS+; abdominal aorta nontender and not dilated by  palpation. Back: no CVA tenderness Pulses 2+ Musculoskeletal: full range of motion, normal strength, no joint deformities Extremities: trace ankle edema; no clubbing cyanosis, Homan's sign negative  Neurologic: grossly nonfocal; Cranial nerves grossly wnl Psychologic: Normal mood and affect    ECG (independently read by me): AV paced rhythm at 60 bpm.  May 2018 ECG (independently read by me): AV paced with 100% capture and a ventricular rate at 60 bpm.  PR interval 202 ms.  03/08/2017 ECG (independently read by me): AV paced rhythm with 100% capture.  Heart rate 60 bpm.  PR interval 206 ms.  01/12/2017 ECG (independently read by me): Paced rhythm with 100% capture at 61 bpm.  September 2017 ECG (independently read by me): AV paced rhythm at 70 bpm.  PR interval 206 ms.  May 2017 ECG (independently read by me): AV paced rhythm at 60 bpm.  PR interval 202 ms.  October 2016 ECG (independently read by me): AV dual paced rhythm with 100% capture.  Ventricular rate 60 bpm.  PR interval 206 ms.  April 2016 ECG  (Independently read by me): Ventricular paced rhythm at 62 bpm; unchanged  October 2015 ECG  (Independently read by me): Ventricular paced rhythm.  Unchanged from prior ECG.  LABS:  BMP Latest Ref Rng & Units 05/18/2017 02/26/2017 02/25/2017  Glucose 65 - 99 mg/dL 133(H) 142(H) -  BUN 6 - 20 mg/dL 27(H) 24(H) -  Creatinine  0.61 - 1.24 mg/dL 1.53(H) 1.36(H) 1.25(H)  Sodium 135 - 145 mmol/L 142 134(L) -  Potassium 3.5 - 5.1 mmol/L 4.1 4.5 -  Chloride 101 - 111 mmol/L 106 102 -  CO2 22 - 32 mmol/L 27 23 -  Calcium 8.9 - 10.3 mg/dL 8.8(L) 8.7(L) -    Hepatic Function Latest Ref Rng & Units 02/23/2017 01/14/2017 04/09/2016  Total Protein 6.5 - 8.1 g/dL 6.7 6.9 6.9  Albumin 3.5 - 5.0 g/dL 3.4(L) 3.9 4.0  AST 15 - 41 U/L '30 12 11  ' ALT 17 - 63 U/L 14(L) 8(L) 7(L)  Alk Phosphatase 38 - 126 U/L 82 88 92  Total Bilirubin 0.3 - 1.2 mg/dL 1.8(H) 0.9 0.8  Bilirubin, Direct 0.1 - 0.5 mg/dL  0.9(H) - -     CBC Latest Ref Rng & Units 05/18/2017 02/26/2017 02/25/2017  WBC 4.0 - 10.5 K/uL 5.1 7.5 7.3  Hemoglobin 13.0 - 17.0 g/dL 10.9(L) 11.0(L) 11.7(L)  Hematocrit 39.0 - 52.0 % 33.1(L) 33.1(L) 33.9(L)  Platelets 150 - 400 K/uL 127(L) 174 168    Lab Results  Component Value Date   TSH 0.44 01/14/2017      BNP    Component Value Date/Time   PROBNP 304.0 (H) 11/12/2008 1154    Lipid Panel     Component Value Date/Time   CHOL 224 (H) 02/24/2017 0343   TRIG 58 02/24/2017 0343   HDL 52 02/24/2017 0343   CHOLHDL 4.3 02/24/2017 0343   VLDL 12 02/24/2017 0343   LDLCALC 160 (H) 02/24/2017 0343     RADIOLOGY: No results found.  IMPRESSION:  1. Coronary artery disease of native artery of native heart with stable angina pectoris ([place])   2. CAD in native artery   3. Pacemaker   4. Hyperlipidemia with target LDL less than 70   5. Hypothyroidism, unspecified type   6. Mild ankle edema     ASSESSMENT AND PLAN: Mr. Bostwick is an 83 year old gentleman who underwent initial bypass surgery in 1989 and stenting to his RCA in 2004.  He had been fairly stable on a medical regimen for his CAD and developed recurrent chest pain symptomatology in the setting of possible COPD exacerbation in January 2017.  Repeat cardiac catheterization at that time revealed progression of his distal RCA which was successfully stented.  He continues to be on medical therapy for his CAD and has documented occluded graft which had supplied his circumflex.  He had been on amlodipine, which was started for elevated blood pressure and when I saw him previously but he had experienced bilateral pedal and ankle edema, right leg greater than left and amlodipine was reduced to 2.5 mg daily.  He was hospitalized in April 2018 with unstable angina.  He underwent repeat catheterization and PCI on 02/24/2017 with successful stenting to the circumflex ostium, which remained patent on repeat evaluation the following  day, despite elevation of troponin levels.   During that hospitalization, he was apparently was changed to diltiazem but this was never filled due to potential interaction with Ranexa. He has done well with his increased anti-anginal regimen and when I last saw him I further titrated isosorbide to 60 mg in the morning and 30 mrem in the evening which did provide additional benefit.  He has continued to take Ranexa 1000 g twice a in addition to amlodipine 5 mg daily for anti-ischemic benefit.  He has experienced several short-lived episodes of recurrent chest pain and states that he typically takes sublingual nitroglycerin at  least 1 and at 2 times per week.  I reviewed his catheterization findings again with him in detail.  I will further titrate isosorbide nitrate to 90 mg in the morning and 30 mg at bedtime.  He continues to also be on carvedilol 25 mg twice a day.  His ECG today remains stable and shows AV sequential pacing.  When I last saw him, I added Zetia to his atorvastatin for more aggressive lipid-lowering therapy with target LDL less than 70.  He has tolerated this well. He continues to be on levothyroxine at 125 g for his hypothyroidism.  He was advised that he can increase his furosemide as needed depending upon ankle edema but presently.  He will continue his 20 mg daily.  As long as he remains stable I will see him in 3-4 months for reevaluation.   Time spent: 25 minutes  Troy Sine, MD, [facility]  07/27/2017 11:42 AM

## 2017-07-28 DIAGNOSIS — I480 Paroxysmal atrial fibrillation: Secondary | ICD-10-CM | POA: Diagnosis not present

## 2017-07-28 DIAGNOSIS — Z79899 Other long term (current) drug therapy: Secondary | ICD-10-CM | POA: Diagnosis not present

## 2017-07-28 DIAGNOSIS — I2510 Atherosclerotic heart disease of native coronary artery without angina pectoris: Secondary | ICD-10-CM | POA: Diagnosis not present

## 2017-07-28 DIAGNOSIS — E785 Hyperlipidemia, unspecified: Secondary | ICD-10-CM | POA: Diagnosis not present

## 2017-07-28 DIAGNOSIS — I10 Essential (primary) hypertension: Secondary | ICD-10-CM | POA: Diagnosis not present

## 2017-07-30 LAB — COMPREHENSIVE METABOLIC PANEL
A/G RATIO: 1.4 (ref 1.2–2.2)
ALK PHOS: 89 IU/L (ref 39–117)
ALT: 7 IU/L (ref 0–44)
AST: 14 IU/L (ref 0–40)
Albumin: 4 g/dL (ref 3.2–4.6)
BUN/Creatinine Ratio: 14 (ref 10–24)
BUN: 21 mg/dL (ref 10–36)
Bilirubin Total: 0.6 mg/dL (ref 0.0–1.2)
CHLORIDE: 102 mmol/L (ref 96–106)
CO2: 24 mmol/L (ref 20–29)
Calcium: 8.9 mg/dL (ref 8.6–10.2)
Creatinine, Ser: 1.49 mg/dL — ABNORMAL HIGH (ref 0.76–1.27)
GFR calc Af Amer: 47 mL/min/{1.73_m2} — ABNORMAL LOW (ref >59)
GFR calc non Af Amer: 40 mL/min/{1.73_m2} — ABNORMAL LOW (ref >59)
GLUCOSE: 95 mg/dL (ref 65–99)
Globulin, Total: 2.8 g/dL (ref 1.5–4.5)
POTASSIUM: 4.3 mmol/L (ref 3.5–5.2)
Sodium: 141 mmol/L (ref 134–144)
Total Protein: 6.8 g/dL (ref 6.0–8.5)

## 2017-07-30 LAB — CBC
Hematocrit: 34.1 % — ABNORMAL LOW (ref 37.5–51.0)
Hemoglobin: 10.8 g/dL — ABNORMAL LOW (ref 13.0–17.7)
MCH: 31.9 pg (ref 26.6–33.0)
MCHC: 31.7 g/dL (ref 31.5–35.7)
MCV: 101 fL — AB (ref 79–97)
PLATELETS: 158 10*3/uL (ref 150–379)
RBC: 3.39 x10E6/uL — ABNORMAL LOW (ref 4.14–5.80)
RDW: 14.7 % (ref 12.3–15.4)
WBC: 3.9 10*3/uL (ref 3.4–10.8)

## 2017-07-30 LAB — LIPID PANEL
CHOLESTEROL TOTAL: 128 mg/dL (ref 100–199)
Chol/HDL Ratio: 2.2 ratio (ref 0.0–5.0)
HDL: 59 mg/dL (ref >39)
LDL Calculated: 54 mg/dL (ref 0–99)
TRIGLYCERIDES: 73 mg/dL (ref 0–149)
VLDL Cholesterol Cal: 15 mg/dL (ref 5–40)

## 2017-07-30 LAB — TSH: TSH: 2.39 u[IU]/mL (ref 0.450–4.500)

## 2017-08-17 DIAGNOSIS — Z23 Encounter for immunization: Secondary | ICD-10-CM | POA: Diagnosis not present

## 2017-08-17 DIAGNOSIS — J449 Chronic obstructive pulmonary disease, unspecified: Secondary | ICD-10-CM | POA: Diagnosis not present

## 2017-08-17 DIAGNOSIS — N183 Chronic kidney disease, stage 3 (moderate): Secondary | ICD-10-CM | POA: Diagnosis not present

## 2017-08-17 DIAGNOSIS — I10 Essential (primary) hypertension: Secondary | ICD-10-CM | POA: Diagnosis not present

## 2017-08-17 DIAGNOSIS — R269 Unspecified abnormalities of gait and mobility: Secondary | ICD-10-CM | POA: Diagnosis not present

## 2017-08-17 DIAGNOSIS — E039 Hypothyroidism, unspecified: Secondary | ICD-10-CM | POA: Diagnosis not present

## 2017-08-17 DIAGNOSIS — I495 Sick sinus syndrome: Secondary | ICD-10-CM | POA: Diagnosis not present

## 2017-08-17 DIAGNOSIS — I25810 Atherosclerosis of coronary artery bypass graft(s) without angina pectoris: Secondary | ICD-10-CM | POA: Diagnosis not present

## 2017-08-17 DIAGNOSIS — I480 Paroxysmal atrial fibrillation: Secondary | ICD-10-CM | POA: Diagnosis not present

## 2017-08-17 DIAGNOSIS — E7800 Pure hypercholesterolemia, unspecified: Secondary | ICD-10-CM | POA: Diagnosis not present

## 2017-09-10 DIAGNOSIS — H04123 Dry eye syndrome of bilateral lacrimal glands: Secondary | ICD-10-CM | POA: Diagnosis not present

## 2017-09-10 DIAGNOSIS — H401223 Low-tension glaucoma, left eye, severe stage: Secondary | ICD-10-CM | POA: Diagnosis not present

## 2017-09-10 DIAGNOSIS — H401212 Low-tension glaucoma, right eye, moderate stage: Secondary | ICD-10-CM | POA: Diagnosis not present

## 2017-09-10 DIAGNOSIS — Z961 Presence of intraocular lens: Secondary | ICD-10-CM | POA: Diagnosis not present

## 2017-09-28 ENCOUNTER — Other Ambulatory Visit: Payer: Self-pay | Admitting: *Deleted

## 2017-09-28 MED ORDER — AMLODIPINE BESYLATE 5 MG PO TABS: 5.0000 mg | ORAL_TABLET | Freq: Every day | ORAL | 0 refills | Status: DC

## 2017-10-25 ENCOUNTER — Encounter: Payer: Self-pay | Admitting: Internal Medicine

## 2017-10-25 DIAGNOSIS — I495 Sick sinus syndrome: Secondary | ICD-10-CM | POA: Diagnosis not present

## 2017-10-26 ENCOUNTER — Ambulatory Visit: Payer: Medicare HMO | Admitting: Cardiovascular Disease

## 2017-10-26 ENCOUNTER — Encounter: Payer: Self-pay | Admitting: Cardiovascular Disease

## 2017-10-26 VITALS — BP 142/64 | HR 66 | Ht 69.0 in | Wt 172.6 lb

## 2017-10-26 DIAGNOSIS — Z950 Presence of cardiac pacemaker: Secondary | ICD-10-CM | POA: Diagnosis not present

## 2017-10-26 DIAGNOSIS — E039 Hypothyroidism, unspecified: Secondary | ICD-10-CM | POA: Diagnosis not present

## 2017-10-26 DIAGNOSIS — E785 Hyperlipidemia, unspecified: Secondary | ICD-10-CM

## 2017-10-26 DIAGNOSIS — I10 Essential (primary) hypertension: Secondary | ICD-10-CM

## 2017-10-26 DIAGNOSIS — I25118 Atherosclerotic heart disease of native coronary artery with other forms of angina pectoris: Secondary | ICD-10-CM

## 2017-10-26 MED ORDER — ISOSORBIDE MONONITRATE ER 60 MG PO TB24: ORAL_TABLET | ORAL | 3 refills | Status: DC

## 2017-10-26 NOTE — Progress Notes (Signed)
Patient ID: John Peck, male   DOB: [birth year], 83 y.o.   MRN: [id number]      HPI: John Peck is a 83 y.o. male who presents to the office today for a 3 month follow-up cardiology evaluation.  Mr. Benish has  CAD and underwent CABG surgery with LIMA to the LAD, a vein to the circumflex in 1989.  In 2004 he underwent stenting to his RCA. Cardiac catheterization in January 2013 showed an ejection fraction of 50% with mild inferior, distal inferior and apical lateral hypocontractility. He had significant native CAD with 80% stenosis of the LAD after the second diagonal vessel and septal perforator artery with total occlusion of the mid LAD. He was 85 - 70% ostial stenosis in the circumflex vessel followed by 40% proximal stenosis and 7080% distal stenosis. He had an occluded Y graft which previously sequentially supplied the intermediate and distal circumflex.There was a patent stented proximal RCA with 90% stenosis in the anterior RV marginal branch arising from the mid RCA and had 70-80% stenosis in the distal RCA beyond the crux with 78% stenosis the PDA takeoff. His LIMA to LAD was patent. He has been on medical therapy. He has a history of atrial fibrillation, sick sinus syndrome and status post permanent pacemaker in 2009 which is a St. Jude device. Additional problems include iron deficiency anemia, hypertension, BPH, status post TURP in 1992, remote history of liver abscess, history of COPD.  In March 2015 due to recurrent increasing symptoms of chest pain he underwent repeat cardiac catheterization which revealed mild LV dysfunction with an EF of 45% inferior wall hypocontractility. There was significant multivessel native coronary obstructive disease with 50% ostial LAD stenosis followed by 80% proximal stenosis in the region of the first diagonal takeoff followed by 80% stenosis after the second diagonal vessel; total occlusion of the ramus intermediate vessel with evidence for  bidirectional retrograde collateralization; 40% ostial left circumflex stenosis; and widely patent proximal RCA stent with 80% stenosis in the anterior RV marginal branch, 40% mid or CVA stenosis, 50% distal, and 50% ostial and mid PDA stenoses. He had a patent LIMA graft supplying the mid LAD and an occluded Y graft which previously supplied the intermediate and distal circumflex.  He has been on increased medical regimen consisting of aspirin, Plavix, carvedilol 25 mg twice a day, high dose oral nitrate therapy, Ranexa 1000 g twice a day.  Amlodipine 10 mg in addition to Crestor 20 mg.  He also is taking Lasix 20 mg with supplemental potassium.  He was seen in the office by Lesia Hausen and admitted to bilateral lower extremity pain and weakness.  His blood pressure was somewhat low and his amlodipine dose was reduced from 10 to 5 mg. Lower extremity Doppler studies showed an ABI of 0.5 on the right and 0.58 on the left.  He had mild dilatation of his distal abdominal aorta.  There was equal to less than 50% diameter reduction.  The right SFA and right popliteal artery and there was a greater than 60% diameter reduction in the left SFA.  However, there was one vessel runoff via the peroneal artery bilaterally.  His symptoms are worse in the right lower extremity due to his distal disease.  He has  had done well with his increased medical regimen including Ranexa, high-dose nitrate therapy, amlodipine and carvedilol.  A follow-up Doppler study in August 2015 showed ABI of 0.5 on the right and 0.58 on the left.  He has  poor distal runoff.  His Doppler studies were reviewed with Dr. Gwenlyn Found who agreed with medical therapy.  He already was on aspirin and Plavix  He was hospitalized in January 2017 with chest pain and shortness of breath.  He without for an MI.  He was treated for COPD exacerbation.  He underwent repeat cardiac catheterization by Dr. Julianne Handler.  He was found to have an occluded mid LAD with  presumptive filling of the distal LAD by the LIMA graft, although during this catheterization.  He was unable to selectively engage the LIMA graft.  He had mild proximal disease in the circumflex vessel.  There was a widely patent stent in the RCA, but he developed a new distal RCA stenosis for which he underwent successful intervention.  He also had 90%.  RV marginal stenosis esters Zabawa still notes rare to occasional episodes of chest pain which do improve following sl nitroglycerin administration.  He has had some mild blood pressure lability.   When I saw him in 2017, with his episodes of rare to occasional chest pain and blood pressure elevation  I reinitiated amlodipine at 5 mg.  when he was last seen in September 2017.  His blood pressure was improved with the addition of amlodipine.  The past several months, he denies any episodes of chest pain.  He admits to occasional episodes of shortness of breath.  He is unaware of palpitations.  He denies bleeding issues.  He has been on amlodipine at 2.5 mg, carvedilol 25 mg twice a day, isosorbide 120 mg in the morning and 60 mg at night, his internal Ranexa 1000 mg twice a day.  Has been taking furosemide 20 mg daily.  His levothyroxine dose had been increased after his TSH was elevated at 8.29 in September 2017 and on follow-up in October 2017 on 0.125 mg daily TSH was 0.87.   He was admitted to [facility] with increasing episodes of chest pain.  He underwent cardiac catheterization on 02/24/2017 by Dr. Audelia Acton.  His LIMA to LAD was patent.  The vein graft that had supplied the circumflex marginal vessel was occluded and he had 80% ostial circumflex stenosis.  The stents in his RCA proximally and distally were patent.  A Resolute 2.518 mm stent was successfully placed at the circumflex ostium.  He subsequently developed an elevated troponin level and had chest pain in the following day was taken back to the laboratory and repeat catheterization by Dr.  Tamala Julian showed the ostial stent site to be widely patent and no new significant changes from the calf the preceding day.  He has experienced occasional episodes of chest pain since his hospital discharge, which have been nitrate responsive.  He apparently was discharged on aspirin, Plavix, diltiazem 180 mg was started but ultimately was never filled due to concomitant ranolazine administration.  He has been taking isosorbide 30 mg, furosemide 20 mg, addition to his levothyroxine.    For initial follow-up evaluation on 03/08/2017 he was without recurrent chest pain.  I further titrated isosorbide to 60 mg and his blood pressure was elevated and he had continued to experience some mild chest pain. I also recommended resumption of amlodipine.  When last seen in May 2018 cause of some mild symptomatology, I recommended that he take isosorbide 60 mg in the morning and added 30 mg in the evening.    On May 18, 2017  he underwent an ER evaluation for increasing shortness of breath.  His furosemide dose was increased to  40 mg for 3 days and then he resume taking 20 mg daily.   Over the last several months, he has continued to be stable.  He has only experienced very rare short-lived episodes of any chest pain which have been minimal and significant improved from previously.  Following his circumflex stent.  He denies any palpitations.  He denies any presyncope or syncope.  Over 2018.  Lipid studies revealed an LDL cholesterol at 46 on atorvastatin 40 mg and Zetia 10 mg.  He continues to be on isosorbide 90 mg in the morning, 30 mg at night, carvedilol 25 mg twice a day amlodipine 5 mg, Ranexa 1000 mg twice a day, which is controlling his angina.  He presents for evaluation.  Past Medical History:  Diagnosis Date  . Anemia   . CAD (coronary artery disease)    a. CABG '89; b. PCI '04; c. 11/2015 Cath/PCI: LM 20ost, LAD 136m LCX 30p, OM2 40, RCA 30p, 10p ISR, 90d (3.0x12 Synergy DES), AM 90, VG->OM2 known to be 100,  LIMA->LAD not injected, patent in 2015.  .Marland KitchenChronic combined systolic and diastolic CHF (congestive heart failure) ([facility]   . CKD (chronic kidney disease), stage III ([place]   . H/O: GI bleed    REMOTE HISTORY  . History of COPD   . Hyperlipidemia   . Hypertensive heart disease   . LBBB (left bundle branch block)   . Pacemaker Sept 2009   St Jude  . PAF (paroxysmal atrial fibrillation) (HCC)    a. not on anticoag due to prior GIB.  . Sick sinus syndrome ([facility] Sept 2009   ST Jude PTVDP    Past Surgical History:  Procedure Laterality Date  . CARDIAC CATHETERIZATION  11/2011   EF 50%; significant native CAD w/80% stenosis of the LAD after 2nd giagonal vessel and septal perforating artery w/total occlusion of the mid left anterior descendiung.; 60-70% ostiasl stenosis in the circumflex vessel followed by 40% proximal stenosis and 70-80% distal circumflex stenosis;   . CARDIAC CATHETERIZATION N/A 11/29/2015   Procedure: Left Heart Cath and Cors/Grafts Angiography;  Surgeon: CBurnell Blanks MD;  [facility];  Service: Cardiovascular;  Laterality: N/A;  . CARDIAC CATHETERIZATION N/A 11/29/2015   Procedure: Coronary Stent Intervention;  Surgeon: CBurnell Blanks MD;  [facility];  Service: Cardiovascular;  Laterality: N/A;  . CATARACT EXTRACTION  2004  . CORONARY ANGIOGRAPHY N/A 02/25/2017   Procedure: Coronary Angiography;  Surgeon: HBelva Crome MD;  [facility];  Service: Cardiovascular;  Laterality: N/A;  . CORONARY ANGIOPLASTY WITH STENT PLACEMENT  2004   RCA  . CORONARY ARTERY BYPASS GRAFT  1989   had LIMA to his LAD, a vein to the circumflex. In 2004 underwent stenting to his right coronary artery.  . CORONARY STENT INTERVENTION N/A 02/24/2017   Procedure: Coronary Stent Intervention;  Surgeon: MWellington Hampshire MD;  [facility];  Service: Cardiovascular;  Laterality: N/A;  cfx  . HEMORRHOID SURGERY    .  INSERT / REPLACE / REMOVE PACEMAKER  07/17/08   DUAL-CHAMBER; PPM-ST.JUDE MEDNET  . LEFT HEART CATH AND CORS/GRAFTS ANGIOGRAPHY N/A 02/24/2017   Procedure: Left Heart Cath and Cors/Grafts Angiography;  Surgeon: MWellington Hampshire MD;  [facility];  Service: Cardiovascular;  Laterality: N/A;  . LEFT HEART CATHETERIZATION WITH CORONARY/GRAFT ANGIOGRAM N/A 12/10/2011   Procedure: LEFT HEART CATHETERIZATION WITH CBeatrix Fetters  Surgeon: TTroy Sine MD;  Location: [facility];  Service: Cardiovascular;  Laterality: N/A;  . LEFT HEART CATHETERIZATION WITH CORONARY/GRAFT ANGIOGRAM N/A 01/26/2014   Procedure: LEFT HEART CATHETERIZATION WITH Beatrix Fetters;  Surgeon: Troy Sine, MD;  Location[facility];  Service: Cardiovascular;  Laterality: N/A;  . PROSTATECTOMY      No Known Allergies  Current Outpatient Medications  Medication Sig Dispense Refill  . albuterol (PROVENTIL HFA;VENTOLIN HFA) 108 (90 Base) MCG/ACT inhaler Inhale 2 puffs into the lungs every 4 (four) hours as needed for wheezing or shortness of breath (or coughing). 1 Inhaler 0  . albuterol (PROVENTIL) (2.5 MG/3ML) 0.083% nebulizer solution Take 3 mLs (2.5 mg total) by nebulization every 4 (four) hours as needed for wheezing or shortness of breath. 30 vial 0  . amLODipine (NORVASC) 5 MG tablet Take 1 tablet (5 mg total) by mouth daily. 90 tablet 0  . aspirin 81 MG tablet Take 81 mg by mouth daily.     Marland Kitchen atorvastatin (LIPITOR) 40 MG tablet Take 1 tablet (40 mg total) by mouth daily. 30 tablet 6  . carvedilol (COREG) 25 MG tablet Take 25 mg by mouth 2 (two) times daily with a meal.      . clopidogrel (PLAVIX) 75 MG tablet Take 75 mg by mouth daily at 3 pm.     . docusate sodium (COLACE) 100 MG capsule Take 1 capsule (100 mg total) by mouth daily as needed for mild constipation. 10 capsule 0  . ezetimibe (ZETIA) 10 MG tablet Take 10 mg by mouth daily.    . ferrous sulfate 325 (65 FE) MG tablet Take  325 mg by mouth 2 (two) times daily with a meal.     . furosemide (LASIX) 20 MG tablet Take 20 mg by mouth daily.    . isosorbide mononitrate (IMDUR) 60 MG 24 hr tablet Take 66m (1.5 tablets) in the AM and 359m(1/2 tablet) in the PM 180 tablet 3  . latanoprost (XALATAN) 0.005 % ophthalmic solution Place 1 drop into both eyes daily.  4  . levothyroxine (SYNTHROID, LEVOTHROID) 125 MCG tablet TAKE 1 TABLET BY MOUTH EVERY DAY BEFORE BREAKFAST 30 tablet 11  . Menthol, Topical Analgesic, (ICY HOT EX) Apply 1 application topically daily as needed (aches and pains).    . nitroGLYCERIN (NITROSTAT) 0.4 MG SL tablet PLACE 1 TABLET UNDER THE TONGUE AS NEEDED. (Patient taking differently: PLACE 1 TABLET UNDER THE TONGUE AS NEEDED FOR CHEST PAIN.) 25 tablet 3  . pantoprazole (PROTONIX) 40 MG tablet Take 40 mg by mouth 2 (two) times daily.    . ranolazine (RANEXA) 1000 MG SR tablet Take 1 tablet (1,000 mg total) by mouth 2 (two) times daily. 180 tablet 1  . tamsulosin (FLOMAX) 0.4 MG CAPS capsule Take 0.4 mg by mouth daily.  2   No current facility-administered medications for this visit.     Social History   Socioeconomic History  . Marital status: Married    Spouse name: Not on file  . Number of children: 5  . Years of education: Not on file  . Highest education level: Not on file  Social Needs  . Financial resource strain: Not on file  . Food insecurity - worry: Not on file  . Food insecurity - inability: Not on file  . Transportation needs - medical: Not on file  . Transportation needs - non-medical: Not on file  Occupational History  . Occupation: RETIRED    Employer: RETIRED    [place] [employer]  Tobacco Use  . Smoking status: Former Smoker    Packs/day: 2.00    Years: 65.00    Pack years: 130.00    Types: Cigarettes  . Smokeless tobacco: Former Systems developer    Quit date: 10/29/2008  Substance and Sexual Activity  . Alcohol use: No  . Drug use: No  . Sexual activity: Not on file    Other Topics Concern  . Not on file  Social History Narrative  . Not on file    Family History  Problem Relation Age of Onset  . Diabetes Father   . Heart disease Sister   . Heart disease Brother   . Heart attack Brother   . Stroke Brother   . Hypertension Neg Hx     ROS General: Negative; No fevers, chills, or night sweats;  HEENT: Negative; No changes in vision or hearing, sinus congestion, difficulty swallowing Pulmonary: Negative; No cough, wheezing, shortness of breath, hemoptysis Cardiovascular: See history of present illness  Positive for claudication, right lower extremity greater than left. GI: Negative; No nausea, vomiting, diarrhea, or abdominal pain GU: Negative; No dysuria, hematuria, or difficulty voiding Musculoskeletal: Negative; no myalgias, joint pain, or weakness Hematologic/Oncology: Negative; no easy bruising, bleeding Endocrine: Negative; no heat/cold intolerance; no diabetes Neuro: Negative; no changes in balance, headaches Skin: Negative; No rashes or skin lesions Psychiatric: Negative; No behavioral problems, depression Sleep: Negative; No snoring, daytime sleepiness, hypersomnolence, bruxism, restless legs, hypnogognic hallucinations, no cataplexy Other comprehensive 14 point system review is negative.   PE BP (!) 142/64   Pulse 66   Ht _0  (1.753 m)   Wt 172 lb 9.6 oz (78.3 kg)   BMI 25.49 kg/m    Repeat blood pressure by me was 138/68.  Wt Readings from Last 3 Encounters:  10/26/17 172 lb 9.6 oz (78.3 kg)  07/27/17 163 lb (73.9 kg)  05/18/17 163 lb (73.9 kg)   General: Alert, oriented, no distress.  Skin: normal turgor, no rashes, warm and dry HEENT: Normocephalic, atraumatic. Pupils equal round and reactive to light; sclera anicteric; extraocular muscles intact;  Nose without nasal septal hypertrophy Mouth/Parynx benign; Mallinpatti scale 3 Neck: No JVD, no carotid bruits; normal carotid upstroke Lungs: clear to ausculatation and  percussion; no wheezing or rales Chest wall: without tenderness to palpitation Heart: PMI not displaced, RRR, s1 s2 normal, 1/6 systolic murmur, no diastolic murmur, no rubs, gallops, thrills, or heaves Abdomen: soft, nontender; no hepatosplenomehaly, BS+; abdominal aorta nontender and not dilated by palpation. Back: no CVA tenderness Pulses 2+ Musculoskeletal: full range of motion, normal strength, no joint deformities Extremities: no clubbing cyanosis or edema, Homan's sign negative  Neurologic: grossly nonfocal; Cranial nerves grossly wnl Psychologic: Normal mood and affect   ECG (independently read by me): AV paced rhythm at 66 bpm.  PR interval 206 msec.  PVCs  September 2018 ECG (independently read by me): AV paced rhythm at 60 bpm.  May 2018 ECG (independently read by me): AV paced with 100% capture and a ventricular rate at 60 bpm.  PR interval 202 ms.  03/08/2017 ECG (independently read by me): AV paced rhythm with 100% capture.  Heart rate 60 bpm.  PR interval 206 ms.  01/12/2017 ECG (independently read by me): Paced rhythm with 100% capture at 61 bpm.  September 2017 ECG (independently read by me): AV paced rhythm at 70 bpm.  PR interval 206 ms.  May 2017 ECG (independently read by me): AV paced rhythm at 60 bpm.  PR interval 202  ms.  October 2016 ECG (independently read by me): AV dual paced rhythm with 100% capture.  Ventricular rate 60 bpm.  PR interval 206 ms.  April 2016 ECG  (Independently read by me): Ventricular paced rhythm at 62 bpm; unchanged  October 2015 ECG  (Independently read by me): Ventricular paced rhythm.  Unchanged from prior ECG.  LABS:  BMP Latest Ref Rng & Units 07/28/2017 05/18/2017 02/26/2017  Glucose 65 - 99 mg/dL 95 133(H) 142(H)  BUN 10 - 36 mg/dL 21 27(H) 24(H)  Creatinine 0.76 - 1.27 mg/dL 1.49(H) 1.53(H) 1.36(H)  BUN/Creat Ratio 10 - 24 14 - -  Sodium 134 - 144 mmol/L 141 142 134(L)  Potassium 3.5 - 5.2 mmol/L 4.3 4.1 4.5  Chloride 96 -  106 mmol/L 102 106 102  CO2 20 - 29 mmol/L _0 Calcium 8.6 - 10.2 mg/dL 8.9 8.8(L) 8.7(L)    Hepatic Function Latest Ref Rng & Units 07/28/2017 02/23/2017 01/14/2017  Total Protein 6.0 - 8.5 g/dL 6.8 6.7 6.9  Albumin 3.2 - 4.6 g/dL 4.0 3.4(L) 3.9  AST 0 - 40 IU/L _1 ALT 0 - 44 IU/L 7 14(L) 8(L)  Alk Phosphatase 39 - 117 IU/L 89 82 88  Total Bilirubin 0.0 - 1.2 mg/dL 0.6 1.8(H) 0.9  Bilirubin, Direct 0.1 - 0.5 mg/dL - 0.9(H) -     CBC Latest Ref Rng & Units 07/28/2017 05/18/2017 02/26/2017  WBC 3.4 - 10.8 x10E3/uL 3.9 5.1 7.5  Hemoglobin 13.0 - 17.7 g/dL 10.8(L) 10.9(L) 11.0(L)  Hematocrit 37.5 - 51.0 % 34.1(L) 33.1(L) 33.1(L)  Platelets 150 - 379 x10E3/uL 158 127(L) 174    Lab Results  Component Value Date   TSH 2.390 07/28/2017      BNP    Component Value Date/Time   PROBNP 304.0 (H) 11/12/2008 1154    Lipid Panel     Component Value Date/Time   CHOL 128 07/28/2017 0813   TRIG 73 07/28/2017 0813   HDL 59 07/28/2017 0813   CHOLHDL 2.2 07/28/2017 0813   CHOLHDL 4.3 02/24/2017 0343   VLDL 12 02/24/2017 0343   LDLCALC 54 07/28/2017 0813     RADIOLOGY: No results found.  IMPRESSION:  1. Coronary artery disease of native artery of native heart with stable angina pectoris ([place])   2. Hyperlipidemia with target LDL less than 70   3. Essential hypertension   4. Hypothyroidism, unspecified type   5. Pacemaker     ASSESSMENT AND PLAN: Mr. Straus is an 83 year old gentleman who underwent initial bypass surgery in 1989 and stenting to his RCA in 2004.  He had been fairly stable on a medical regimen for his CAD and developed recurrent chest pain symptomatology in the setting of possible COPD exacerbation in January 2017.  Repeat cardiac catheterization at that time revealed progression of his distal RCA which was successfully stented.  He continues to be on medical therapy for his CAD and has documented occluded graft which had supplied his circumflex.  He had  been on amlodipine, which was started for elevated blood pressure and when I saw him previously but he had experienced bilateral pedal and ankle edema, right leg greater than left and amlodipine was reduced to 2.5 mg daily.  He was hospitalized in April 2018 with unstable angina.  He underwent repeat catheterization and PCI on 02/24/2017 with successful stenting to the circumflex ostium, which remained patent on repeat evaluation the following day, despite elevation of troponin levels.  He had experienced  recurrent anginal symptomatology.  However, most recently, his angina has been well controlled on his combined regimen consisting of amlodipine 5 mg daily, carvedilol 25 mg twice a day, isosorbide 90 mg in the morning and 30 mg at night and ranolazine 1000 mg twice a day.  He has not required any recent nitroglycerin use sublingually.  He continues to be on atorvastatin 40 mg and Zetia 10 mg and recent LDL is excellent at 46.  He takes albuterol as needed for his COPD and lung disease.  He also takes Lasix 20 mg daily.  At present, there is no significant edema.  His reflux is controlled with Protonix 40 mg.  He is on levothyroxine 1 and 25 g for hypothyroidism.  Most recent TSH was normal at 1.49.  I have recommended he continue his current medical regimen, which appears to have allowed disease stability.  I will see him in 6 months for re-evaluation.  Time spent: 25 minutes  Troy Sine, MD, [facility]  10/28/2017 10:13 PM

## 2017-10-26 NOTE — Patient Instructions (Signed)
Medication Instructions:  Your physician recommends that you continue on your current medications as directed. Please refer to the Current Medication list given to you today.  Follow-Up: Your physician wants you to follow-up in: 6 months with Dr. Kelly.  You will receive a reminder letter in the mail two months in advance. If you don't receive a letter, please call our office to schedule the follow-up appointment.      If you need a refill on your cardiac medications before your next appointment, please call your pharmacy.   

## 2017-10-28 ENCOUNTER — Encounter: Payer: Self-pay | Admitting: Cardiovascular Disease

## 2017-11-26 ENCOUNTER — Ambulatory Visit (INDEPENDENT_AMBULATORY_CARE_PROVIDER_SITE_OTHER): Payer: Medicare HMO | Admitting: *Deleted

## 2017-11-26 DIAGNOSIS — I495 Sick sinus syndrome: Secondary | ICD-10-CM | POA: Diagnosis not present

## 2017-11-26 DIAGNOSIS — I480 Paroxysmal atrial fibrillation: Secondary | ICD-10-CM

## 2017-11-26 LAB — CUP PACEART INCLINIC DEVICE CHECK
Battery Impedance: 16200 Ohm
Battery Remaining Longevity: 0 mo
Date Time Interrogation Session: 20190118132856
Implantable Lead Implant Date: 20090908
Implantable Lead Location: 753860
Implantable Pulse Generator Implant Date: 20090908
Lead Channel Impedance Value: 339 Ohm
Lead Channel Impedance Value: 368 Ohm
Lead Channel Pacing Threshold Pulse Width: 0.4 ms
Lead Channel Sensing Intrinsic Amplitude: 2.9 mV
Lead Channel Setting Pacing Amplitude: 2.5 V
Lead Channel Setting Pacing Pulse Width: 0.4 ms
Lead Channel Setting Sensing Sensitivity: 2 mV
MDC IDC LEAD IMPLANT DT: 20090908
MDC IDC LEAD LOCATION: 753859
MDC IDC MSMT BATTERY VOLTAGE: 2.59 V
MDC IDC MSMT LEADCHNL RV PACING THRESHOLD AMPLITUDE: 1.25 V
MDC IDC SET LEADCHNL RA PACING AMPLITUDE: 2 V
MDC IDC STAT BRADY RA PERCENT PACED: 94 %
MDC IDC STAT BRADY RV PERCENT PACED: 99 % — AB
Pulse Gen Model: 5826
Pulse Gen Serial Number: 1266473

## 2017-11-26 NOTE — Progress Notes (Signed)
Pacemaker check in clinic. Normal device function. Threshold, sensing, impedances consistent with previous measurements. Device programmed to maximize longevity. 2.9% AT/AF burden, presently in AFL + ASA d/t GI bleed. Device programmed at appropriate safety margins. Histogram distribution appropriate for patient activity level. Device programmed to optimize intrinsic conduction. ERI reached on 11/13/2017. Patient will follow up with RU on 2/1 @ 1015.

## 2017-12-06 NOTE — Progress Notes (Signed)
Cardiology Office Note Date:  12/10/2017  Patient ID:  John Peck, John Peck 14-Jul-1924, MRN [id number] PCP:  Seward Carol, MD  Cardiologist:  Dr. Claiborne Billings Electrophysiologist: Dr. Lovena Le   Chief Complaint: device is at ERI  History of Present Illness: John Peck is a 84 y.o. male with history of CAD (remote CABG, most recent intervention was PCI to Cx 02/24/17, with prior stenting in jan 2017 to RCA), HTN, HLD, LBBB,CHB w/PPM, PAFib not on a/c 2/2 hx of GIB, CRI (III), COPD.  He comes in today to be seen for Dr. Lovena Le, last seen by him in Dec 2017.  At that time, no changes to programming or therapy was made.  The patient is accompanied by his wife and daughter.  He denies any dizziness, near syncope or syncope.  Though otherwise not feeling well at all.  He reports some degree of baseline DOE/SOB, though may be more then usual, also mentions central CP, knife/stabbing with a pressure component that is exertional and is taking more s/l NTG then usual, up to 3 a day.  No palpitations, no symptoms of PND or orthopnea, doesn't sound like he has any symptoms at rest.  Onset of the increase in his symptoms is very hard to nail down, he reports " a good long while", "quite some time".  His Wife is not certain, maybe a couple months.  I note his pacer triggered ERI Jjan 6, and ask if they think it has been just January that he has felt different/worse (with loss of rate response)  But still is unclear.  We discuss concerns of his CAD, he is clear and we discussed in the presence pf his wife and daughter, he is clear he would not want to pursue cath procedure.  After lengthy discussion note that he is not taking his Imdur as was prescribed, was to h ave taken 1.5 tabs 90mg  in AM and 1/2 tab 30mg  PM, he is actually taking only 60 (1 tab) in the AM and the 1/2 PM.  We discussed his pacer reaching ERI, discussed the generator change procedure, risks and benefits, he would like to proceed with  this.  Device information SJM dual chamber PPM, 07/17/2008  Past Medical History:  Diagnosis Date  . Anemia   . CAD (coronary artery disease)    a. CABG '89; b. PCI '04; c. 11/2015 Cath/PCI: LM 20ost, LAD 111m, LCX 30p, OM2 40, RCA 30p, 10p ISR, 90d (3.0x12 Synergy DES), AM 90, VG->OM2 known to be 100, LIMA->LAD not injected, patent in 2015.  Marland Kitchen Chronic combined systolic and diastolic CHF (congestive heart failure) ([place])   . CKD (chronic kidney disease), stage III ([place])   . H/O: GI bleed    REMOTE HISTORY  . History of COPD   . Hyperlipidemia   . Hypertensive heart disease   . LBBB (left bundle branch block)   . Pacemaker Sept 2009   St Jude  . PAF (paroxysmal atrial fibrillation) ([facility])    a. not on anticoag due to prior GIB.  . Sick sinus syndrome [facility]) Sept 2009   ST Jude PTVDP    Past Surgical History:  Procedure Laterality Date  . CARDIAC CATHETERIZATION  11/2011   EF 50%; significant native CAD w/80% stenosis of the LAD after 2nd giagonal vessel and septal perforating artery w/total occlusion of the mid left anterior descendiung.; 60-70% ostiasl stenosis in the circumflex vessel followed by 40% proximal stenosis and 70-80% distal circumflex stenosis;   . CARDIAC CATHETERIZATION  N/A 11/29/2015   Procedure: Left Heart Cath and Cors/Grafts Angiography;  Surgeon: Burnell Blanks, MD;  Location: [place] [facility];  Service: Cardiovascular;  Laterality: N/A;  . CARDIAC CATHETERIZATION N/A 11/29/2015   Procedure: Coronary Stent Intervention;  Surgeon: Burnell Blanks, MD;  Location: [place] [facility];  Service: Cardiovascular;  Laterality: N/A;  . CATARACT EXTRACTION  2004  . CORONARY ANGIOGRAPHY N/A 02/25/2017   Procedure: Coronary Angiography;  Surgeon: Belva Crome, MD;  Location: [place] [facility];  Service: Cardiovascular;  Laterality: N/A;  . CORONARY ANGIOPLASTY WITH STENT PLACEMENT  2004   RCA  . CORONARY ARTERY BYPASS GRAFT  1989   had LIMA to his LAD, a vein  to the circumflex. In 2004 underwent stenting to his right coronary artery.  . CORONARY STENT INTERVENTION N/A 02/24/2017   Procedure: Coronary Stent Intervention;  Surgeon: Wellington Hampshire, MD;  Location: [place] [facility];  Service: Cardiovascular;  Laterality: N/A;  cfx  . HEMORRHOID SURGERY    . INSERT / REPLACE / REMOVE PACEMAKER  07/17/08   DUAL-CHAMBER; PPM-ST.JUDE MEDNET  . LEFT HEART CATH AND CORS/GRAFTS ANGIOGRAPHY N/A 02/24/2017   Procedure: Left Heart Cath and Cors/Grafts Angiography;  Surgeon: Wellington Hampshire, MD;  Location: [place] [facility];  Service: Cardiovascular;  Laterality: N/A;  . LEFT HEART CATHETERIZATION WITH CORONARY/GRAFT ANGIOGRAM N/A 12/10/2011   Procedure: LEFT HEART CATHETERIZATION WITH Beatrix Fetters;  Surgeon: Troy Sine, MD;  Location: [facility] CATH LAB;  Service: Cardiovascular;  Laterality: N/A;  . LEFT HEART CATHETERIZATION WITH CORONARY/GRAFT ANGIOGRAM N/A 01/26/2014   Procedure: LEFT HEART CATHETERIZATION WITH Beatrix Fetters;  Surgeon: Troy Sine, MD;  Location: [facility] CATH LAB;  Service: Cardiovascular;  Laterality: N/A;  . PROSTATECTOMY      Current Outpatient Medications  Medication Sig Dispense Refill  . albuterol (PROVENTIL HFA;VENTOLIN HFA) 108 (90 Base) MCG/ACT inhaler Inhale 2 puffs into the lungs every 4 (four) hours as needed for wheezing or shortness of breath (or coughing). 1 Inhaler 0  . albuterol (PROVENTIL) (2.5 MG/3ML) 0.083% nebulizer solution Take 3 mLs (2.5 mg total) by nebulization every 4 (four) hours as needed for wheezing or shortness of breath. 30 vial 0  . amLODipine (NORVASC) 5 MG tablet Take 1 tablet (5 mg total) by mouth daily. 90 tablet 0  . aspirin 81 MG tablet Take 81 mg by mouth daily.     Marland Kitchen atorvastatin (LIPITOR) 40 MG tablet Take 1 tablet (40 mg total) by mouth daily. 30 tablet 6  . carvedilol (COREG) 25 MG tablet Take 25 mg by mouth 2 (two) times daily with a meal.      . clopidogrel (PLAVIX) 75 MG tablet  Take 75 mg by mouth daily at 3 pm.     . docusate sodium (COLACE) 100 MG capsule Take 1 capsule (100 mg total) by mouth daily as needed for mild constipation. 10 capsule 0  . ezetimibe (ZETIA) 10 MG tablet Take 10 mg by mouth daily.    . ferrous sulfate 325 (65 FE) MG tablet Take 325 mg by mouth 2 (two) times daily with a meal.     . furosemide (LASIX) 20 MG tablet Take 20 mg by mouth daily.    . isosorbide mononitrate (IMDUR) 60 MG 24 hr tablet Take 90mg  (1.5 tablets) in the AM and 30mg  (1/2 tablet) in the PM 180 tablet 3  . latanoprost (XALATAN) 0.005 % ophthalmic solution Place 1 drop into both eyes daily.  4  .  levothyroxine (SYNTHROID, LEVOTHROID) 125 MCG tablet TAKE 1 TABLET BY MOUTH EVERY DAY BEFORE BREAKFAST 30 tablet 11  . Menthol, Topical Analgesic, (ICY HOT EX) Apply 1 application topically daily as needed (aches and pains).    . nitroGLYCERIN (NITROSTAT) 0.4 MG SL tablet PLACE 1 TABLET UNDER THE TONGUE AS NEEDED. (Patient taking differently: PLACE 1 TABLET UNDER THE TONGUE AS NEEDED FOR CHEST PAIN.) 25 tablet 3  . pantoprazole (PROTONIX) 40 MG tablet Take 40 mg by mouth 2 (two) times daily.    . ranolazine (RANEXA) 1000 MG SR tablet Take 1 tablet (1,000 mg total) by mouth 2 (two) times daily. 180 tablet 1  . tamsulosin (FLOMAX) 0.4 MG CAPS capsule Take 0.4 mg by mouth daily.  2   No current facility-administered medications for this visit.     Allergies:   Patient has no known allergies.   Social History:  The patient  reports that he has quit smoking. His smoking use included cigarettes. He has a 130.00 pack-year smoking history. He quit smokeless tobacco use about 9 years ago. He reports that he does not drink alcohol or use drugs.   Family History:  The patient's family history includes Diabetes in his father; Heart attack in his brother; Heart disease in his brother and sister; Stroke in his brother.  ROS:  Please see the history of present illness.  All other systems are  reviewed and otherwise negative.   PHYSICAL EXAM: VS:  BP 130/62   Pulse (!) 54   Ht 5\' 6"  (1.676 m)   Wt 172 lb (78 kg)   BMI 27.76 kg/m  BMI: Body mass index is 27.76 kg/m. Well nourished, well developed, in no acute distress  HEENT: normocephalic, atraumatic  Neck: no JVD, carotid bruits or masses Cardiac:  RRR; no significant murmurs, no rubs, or gallops Lungs:  CTA b/l, no wheezing, rhonchi or rales  Abd: soft, nontender MS: no deformity, age appropriate atrophy Ext: trace edema b/l LE Skin: warm and dry, no rash Neuro:  No gross deficits appreciated Psych: euthymic mood, full affect  PPM site is stable, no tethering or discomfort   EKG:  Done today and reviewed by myself: baseline motion, SB 54, Vpaced PPM interrogation done today and reviewed by myself; battery reached ERI 11/13/17, lead measurements are good, 18 AMS, no EGMs  02/25/17: LHC Conclusion   Complicated coronary only procedure due to marked tortuosity within the subclavian and innominate artery preventing catheter control and advancement.  Right coronary is unchanged.  The native left coronary demonstrates wide patency of the stent implanted yesterday.  Chest pain and elevated troponin will need to be further investigated. It is possible that the elevated troponin is a residual of the PCI performed yesterday. The current chest pain complaint is vague and according to the patient, present for quite some time before admission to the hospital.   02/24/17: LHC/PCI  Ost LM to LM lesion, 20 %stenosed.  2nd Mrg lesion, 40 %stenosed.  Mid LAD lesion, 100 %stenosed.  LIMA.  Origin lesion, 100 %stenosed.  Acute Mrg lesion, 90 %stenosed.  Prox RCA-2 lesion, 10 %stenosed.  Dist RCA lesion, 0 %stenosed.  A drug eluting .  Prox RCA-1 lesion, 40 %stenosed.  Ost LAD lesion, 50 %stenosed.  Mid Cx lesion, 30 %stenosed.  Ost 1st Diag to 1st Diag lesion, 30 %stenosed.  There is mild left ventricular  systolic dysfunction.  LV end diastolic pressure is mildly elevated.  The left ventricular ejection fraction is 45-50% by  visual estimate.  A STENT RESOLUTE ONYX 2.5X18 drug eluting stent was successfully placed.  Prox Cx lesion, 80 %stenosed.  Post intervention, there is a 0% residual stenosis. 1. Significant native three-vessel coronary artery disease with occluded mid LAD. Patent LIMA to LAD and patent stents in the right coronary artery. Progression of proximal left circumflex disease with known occluded SVG graft to OM. 2. Mildly reduced LV systolic function and mildly elevated left ventricular end-diastolic pressure. 3. Successful angioplasty and drug-eluting stent placement to the proximal left circumflex extending all the way back to the ostium. Recommendations: Dual antiplatelet therapy for at least one year and preferably indefinitely. The patient was noted to have episodes of paroxysmal tachycardia throughout the case likely atrial fibrillation versus atrial tachycardia with a heart rate up to 110 bpm. This might be also responsible for some of his anginal symptoms. I switched amlodipine to diltiazem for better rate control. Difficult cath via the left radial artery due to left radial loop and left subclavian artery tortuosity.  11/28/15: TTE Study Conclusions - Left ventricle: The cavity size was normal. Wall thickness was   normal. Systolic function was normal. The estimated ejection   fraction was in the range of 50% to 55%. There is hypokinesis of   the apical myocardium. - Ventricular septum: Septal motion showed abnormal function and   dyssynergy. - Aortic valve: Trileaflet; mildly thickened, mildly calcified   leaflets. - Left atrium: The atrium was mildly dilated.   Recent Labs: 05/18/2017: B Natriuretic Peptide 276.5 07/28/2017: ALT 7; BUN 21; Creatinine, Ser 1.49; Hemoglobin 10.8; Platelets 158; Potassium 4.3; Sodium 141; TSH 2.390  02/24/2017: VLDL 12 07/28/2017:  Chol/HDL Ratio 2.2; Cholesterol, Total 128; HDL 59; LDL Calculated 54; Triglycerides 73   CrCl cannot be calculated (Patient's most recent lab result is older than the maximum 21 days allowed.).   Wt Readings from Last 3 Encounters:  12/10/17 172 lb (78 kg)  10/26/17 172 lb 9.6 oz (78.3 kg)  07/27/17 163 lb (73.9 kg)     Other studies reviewed: Additional studies/records reviewed today include: summarized above  ASSESSMENT AND PLAN:  1. PPM     Battery reached ERI 11/13/17     Plan for gen change  2. CAD     Increase in exertional SOB/CP     Exam does not suggest fluid OL   I suspect perhaps a component is/mat be loss of his rate response programming with ERI, discussed with DOD, Dr. Radford Pax.  The patient does not want to consider repeat cath, not unreasonable at 84 y/o.  Go ahead and have him take the Imdur as written 90mg  AM, 30mg  PM, get gen change ASAP (scheduled for 12/15/17) and have him seen by Dr. Claiborne Billings ASAP post generator change to assess further.  The patient/family are instructed if any escalation in symptoms, development of any rest symptoms or unresolved with s/l NTG to seek attention.  3. HTN     Room for the increase in Imdu  4. Hx of PAFib     Not on a/c 2/2 GIB hx     Episode triggers are programmed off, 18 AMS episodes, no EGMs   Disposition: routine post gen change f/u, Dr. Claiborne Billings ASAP as noted above  Current medicines are reviewed at length with the patient today.  The patient did not have any concerns regarding medicines.  Venetia Night, PA-C 12/10/2017 12:57 PM     [address] [place] [address] [phone] (  office)  [phone] (fax)

## 2017-12-09 ENCOUNTER — Encounter: Payer: Self-pay | Admitting: Physician Assistant

## 2017-12-10 ENCOUNTER — Encounter: Payer: Self-pay | Admitting: *Deleted

## 2017-12-10 ENCOUNTER — Ambulatory Visit: Payer: Medicare HMO | Admitting: Physician Assistant

## 2017-12-10 DIAGNOSIS — Z01818 Encounter for other preprocedural examination: Secondary | ICD-10-CM | POA: Diagnosis not present

## 2017-12-10 DIAGNOSIS — Z45010 Encounter for checking and testing of cardiac pacemaker pulse generator [battery]: Secondary | ICD-10-CM

## 2017-12-10 DIAGNOSIS — I480 Paroxysmal atrial fibrillation: Secondary | ICD-10-CM

## 2017-12-10 DIAGNOSIS — I10 Essential (primary) hypertension: Secondary | ICD-10-CM | POA: Diagnosis not present

## 2017-12-10 DIAGNOSIS — I2510 Atherosclerotic heart disease of native coronary artery without angina pectoris: Secondary | ICD-10-CM | POA: Diagnosis not present

## 2017-12-10 DIAGNOSIS — I5032 Chronic diastolic (congestive) heart failure: Secondary | ICD-10-CM

## 2017-12-10 NOTE — Patient Instructions (Addendum)
Medication Instructions:   Your physician recommends that you continue on your current medications as directed.   Make your taking  Isorbide as instructed  One half tablet in am half tablet in the pm    If you need a refill on your cardiac medications before your next appointment, please call your pharmacy.   Labwork:  CBC BMET AND PT/PTT TODAY    Testing/Procedures:  SEE LETTER FOR GEN CHANGE ON 12-15-17    Follow-Up:  After 12-15-17     10 DAY WOUND CHECK   [address]  [facility] DR. Lovena Le    Any Other Special Instructions Will Be Listed Below (If Applicable).

## 2017-12-11 LAB — PT AND PTT
APTT: 30 s (ref 24–33)
INR: 1.2 (ref 0.8–1.2)
PROTHROMBIN TIME: 12.2 s — AB (ref 9.1–12.0)

## 2017-12-11 LAB — BASIC METABOLIC PANEL
BUN / CREAT RATIO: 17 (ref 10–24)
BUN: 23 mg/dL (ref 10–36)
CO2: 25 mmol/L (ref 20–29)
CREATININE: 1.32 mg/dL — AB (ref 0.76–1.27)
Calcium: 8.7 mg/dL (ref 8.6–10.2)
Chloride: 101 mmol/L (ref 96–106)
GFR calc Af Amer: 54 mL/min/{1.73_m2} — ABNORMAL LOW (ref >59)
GFR calc non Af Amer: 47 mL/min/{1.73_m2} — ABNORMAL LOW (ref >59)
GLUCOSE: 87 mg/dL (ref 65–99)
Potassium: 4.5 mmol/L (ref 3.5–5.2)
Sodium: 140 mmol/L (ref 134–144)

## 2017-12-11 LAB — CBC
Hematocrit: 31.8 % — ABNORMAL LOW (ref 37.5–51.0)
Hemoglobin: 10.3 g/dL — ABNORMAL LOW (ref 13.0–17.7)
MCH: 32.2 pg (ref 26.6–33.0)
MCHC: 32.4 g/dL (ref 31.5–35.7)
MCV: 99 fL — ABNORMAL HIGH (ref 79–97)
Platelets: 129 10*3/uL — ABNORMAL LOW (ref 150–379)
RBC: 3.2 x10E6/uL — ABNORMAL LOW (ref 4.14–5.80)
RDW: 15.4 % (ref 12.3–15.4)
WBC: 3.9 10*3/uL (ref 3.4–10.8)

## 2017-12-14 ENCOUNTER — Telehealth: Payer: Self-pay | Admitting: *Deleted

## 2017-12-14 NOTE — Telephone Encounter (Signed)
-----   Message from [place], [place] sent at 12/14/2017 10:35 AM EST ----- Labs look OK for planned generator change procedure  Thanks renee

## 2017-12-14 NOTE — Telephone Encounter (Signed)
LMOVM HER FATHER LAB RESULTS ARE STABLE FOR GEN  CHANGE  OUT

## 2017-12-15 ENCOUNTER — Ambulatory Visit (HOSPITAL_COMMUNITY)
Admission: RE | Disposition: A | Discharge status: Home / Self Care | Payer: Self-pay | Source: Ambulatory Visit | Attending: Internal Medicine

## 2017-12-15 ENCOUNTER — Ambulatory Visit (HOSPITAL_COMMUNITY)
Admission: RE | Admit: 2017-12-15 | Discharge: 2017-12-15 | Disposition: A | Discharge status: Home / Self Care | Payer: Medicare HMO | Source: Ambulatory Visit | Attending: Internal Medicine | Admitting: Internal Medicine

## 2017-12-15 DIAGNOSIS — I442 Atrioventricular block, complete: Secondary | ICD-10-CM | POA: Diagnosis not present

## 2017-12-15 DIAGNOSIS — Z950 Presence of cardiac pacemaker: Secondary | ICD-10-CM | POA: Diagnosis present

## 2017-12-15 DIAGNOSIS — I5042 Chronic combined systolic (congestive) and diastolic (congestive) heart failure: Secondary | ICD-10-CM | POA: Diagnosis not present

## 2017-12-15 DIAGNOSIS — Z79899 Other long term (current) drug therapy: Secondary | ICD-10-CM | POA: Diagnosis not present

## 2017-12-15 DIAGNOSIS — Z45010 Encounter for checking and testing of cardiac pacemaker pulse generator [battery]: Secondary | ICD-10-CM | POA: Diagnosis present

## 2017-12-15 DIAGNOSIS — E785 Hyperlipidemia, unspecified: Secondary | ICD-10-CM | POA: Diagnosis not present

## 2017-12-15 DIAGNOSIS — Z79890 Hormone replacement therapy (postmenopausal): Secondary | ICD-10-CM | POA: Insufficient documentation

## 2017-12-15 DIAGNOSIS — J449 Chronic obstructive pulmonary disease, unspecified: Secondary | ICD-10-CM | POA: Insufficient documentation

## 2017-12-15 DIAGNOSIS — Z87891 Personal history of nicotine dependence: Secondary | ICD-10-CM | POA: Insufficient documentation

## 2017-12-15 DIAGNOSIS — I2510 Atherosclerotic heart disease of native coronary artery without angina pectoris: Secondary | ICD-10-CM | POA: Diagnosis present

## 2017-12-15 DIAGNOSIS — Z951 Presence of aortocoronary bypass graft: Secondary | ICD-10-CM | POA: Diagnosis not present

## 2017-12-15 DIAGNOSIS — Z8249 Family history of ischemic heart disease and other diseases of the circulatory system: Secondary | ICD-10-CM | POA: Insufficient documentation

## 2017-12-15 DIAGNOSIS — Z7982 Long term (current) use of aspirin: Secondary | ICD-10-CM | POA: Diagnosis not present

## 2017-12-15 DIAGNOSIS — I480 Paroxysmal atrial fibrillation: Secondary | ICD-10-CM | POA: Diagnosis not present

## 2017-12-15 DIAGNOSIS — I447 Left bundle-branch block, unspecified: Secondary | ICD-10-CM | POA: Diagnosis not present

## 2017-12-15 DIAGNOSIS — Z955 Presence of coronary angioplasty implant and graft: Secondary | ICD-10-CM | POA: Diagnosis not present

## 2017-12-15 DIAGNOSIS — I130 Hypertensive heart and chronic kidney disease with heart failure and stage 1 through stage 4 chronic kidney disease, or unspecified chronic kidney disease: Secondary | ICD-10-CM | POA: Diagnosis not present

## 2017-12-15 DIAGNOSIS — N183 Chronic kidney disease, stage 3 (moderate): Secondary | ICD-10-CM | POA: Diagnosis not present

## 2017-12-15 DIAGNOSIS — Z7902 Long term (current) use of antithrombotics/antiplatelets: Secondary | ICD-10-CM | POA: Insufficient documentation

## 2017-12-15 DIAGNOSIS — I495 Sick sinus syndrome: Secondary | ICD-10-CM | POA: Diagnosis not present

## 2017-12-15 HISTORY — PX: PPM GENERATOR CHANGEOUT: EP1233

## 2017-12-15 LAB — SURGICAL PCR SCREEN
MRSA, PCR: NEGATIVE
STAPHYLOCOCCUS AUREUS: NEGATIVE

## 2017-12-15 SURGERY — PPM GENERATOR CHANGEOUT

## 2017-12-15 MED ORDER — ONDANSETRON HCL 4 MG/2ML IJ SOLN: 4.0000 mg | Freq: Four times a day (QID) | INTRAMUSCULAR | Status: DC | PRN

## 2017-12-15 MED ORDER — LIDOCAINE HCL (PF) 1 % IJ SOLN
INTRAMUSCULAR | Status: DC | PRN
  Administered 2017-12-15: 60 mL

## 2017-12-15 MED ORDER — LIDOCAINE HCL (PF) 1 % IJ SOLN
INTRAMUSCULAR | Status: AC
  Filled 2017-12-15: qty 30

## 2017-12-15 MED ORDER — CEFAZOLIN SODIUM-DEXTROSE 2-4 GM/100ML-% IV SOLN
INTRAVENOUS | Status: AC
  Filled 2017-12-15: qty 100

## 2017-12-15 MED ORDER — CHLORHEXIDINE GLUCONATE 4 % EX LIQD: 60.0000 mL | Freq: Once | CUTANEOUS | Status: DC

## 2017-12-15 MED ORDER — ACETAMINOPHEN 325 MG PO TABS: 325.0000 mg | ORAL_TABLET | ORAL | Status: DC | PRN

## 2017-12-15 MED ORDER — MUPIROCIN 2 % EX OINT
TOPICAL_OINTMENT | CUTANEOUS | Status: AC
  Administered 2017-12-15: 12:00:00
  Filled 2017-12-15: qty 22

## 2017-12-15 MED ORDER — SODIUM CHLORIDE 0.9 % IR SOLN
80.0000 mg | Status: AC
  Administered 2017-12-15: 80 mg

## 2017-12-15 MED ORDER — CEFAZOLIN SODIUM-DEXTROSE 2-4 GM/100ML-% IV SOLN
2.0000 g | INTRAVENOUS | Status: AC
Start: 2017-12-15 — End: 2017-12-15
  Administered 2017-12-15: 2 g via INTRAVENOUS

## 2017-12-15 MED ORDER — SODIUM CHLORIDE 0.9 % IR SOLN
Status: AC
  Filled 2017-12-15: qty 2

## 2017-12-15 MED ORDER — SODIUM CHLORIDE 0.9 % IV SOLN
INTRAVENOUS | Status: DC
  Administered 2017-12-15: 12:00:00 via INTRAVENOUS

## 2017-12-15 SURGICAL SUPPLY — 4 items
CABLE SURGICAL S-101-97-12 (CABLE) ×1 IMPLANT
PACEMAKER ASSURITY DR-RF (Pacemaker) ×1 IMPLANT
PAD DEFIB LIFELINK (PAD) ×1 IMPLANT
TRAY PACEMAKER INSERTION (PACKS) ×1 IMPLANT

## 2017-12-15 NOTE — H&P (Signed)
Cardiology Office Note Date:  12/10/2017  Patient ID:  Peck, John 07-Sep-1924, MRN [id number] PCP:  Seward Carol, MD        Cardiologist:  Dr. Claiborne Billings Electrophysiologist: Dr. Lovena Le   Chief Complaint: device is at ERI  History of Present Illness: John Peck is a 84 y.o. male with history of CAD (remote CABG, most recent intervention was PCI to Cx 02/24/17, with prior stenting in jan 2017 to RCA), HTN, HLD, LBBB,CHB w/PPM, PAFib not on a/c 2/2 hx of GIB, CRI (III), COPD.  He comes in today to be seen for Dr. Lovena Le, last seen by him in Dec 2017.  At that time, no changes to programming or therapy was made.  The patient is accompanied by his wife and daughter.  He denies any dizziness, near syncope or syncope.  Though otherwise not feeling well at all.  He reports some degree of baseline DOE/SOB, though may be more then usual, also mentions central CP, knife/stabbing with a pressure component that is exertional and is taking more s/l NTG then usual, up to 3 a day.  No palpitations, no symptoms of PND or orthopnea, doesn't sound like he has any symptoms at rest.  Onset of the increase in his symptoms is very hard to nail down, he reports " a good long while", "quite some time".  His Wife is not certain, maybe a couple months.  I note his pacer triggered ERI Jjan 6, and ask if they think it has been just January that he has felt different/worse (with loss of rate response)  But still is unclear.  We discuss concerns of his CAD, he is clear and we discussed in the presence pf his wife and daughter, he is clear he would not want to pursue cath procedure.  After lengthy discussion note that he is not taking his Imdur as was prescribed, was to have taken 1.5 tabs 90mg  in AM and 1/2 tab 30mg  PM, he is actually taking only 60 (1 tab) in the AM and the 1/2 PM.  We discussed his pacer reaching ERI, discussed the generator change procedure, risks and benefits, he would like to proceed with  this.  Device information SJM dual chamber PPM, 07/17/2008      Past Medical History:  Diagnosis Date  . Anemia   . CAD (coronary artery disease)    a. CABG '89; b. PCI '04; c. 11/2015 Cath/PCI: LM 20ost, LAD 142m, LCX 30p, OM2 40, RCA 30p, 10p ISR, 90d (3.0x12 Synergy DES), AM 90, VG->OM2 known to be 100, LIMA->LAD not injected, patent in 2015.  Marland Kitchen Chronic combined systolic and diastolic CHF (congestive heart failure) ([place])   . CKD (chronic kidney disease), stage III ([place])   . H/O: GI bleed    REMOTE HISTORY  . History of COPD   . Hyperlipidemia   . Hypertensive heart disease   . LBBB (left bundle branch block)   . Pacemaker Sept 2009   St Jude  . PAF (paroxysmal atrial fibrillation) ([facility])    a. not on anticoag due to prior GIB.  . Sick sinus syndrome [facility]) Sept 2009   ST Jude PTVDP         Past Surgical History:  Procedure Laterality Date  . CARDIAC CATHETERIZATION  11/2011   EF 50%; significant native CAD w/80% stenosis of the LAD after 2nd giagonal vessel and septal perforating artery w/total occlusion of the mid left anterior descendiung.; 60-70% ostiasl stenosis in the circumflex vessel followed  by 40% proximal stenosis and 70-80% distal circumflex stenosis;   . CARDIAC CATHETERIZATION N/A 11/29/2015   Procedure: Left Heart Cath and Cors/Grafts Angiography;  Surgeon: Burnell Blanks, MD;  Location: [facility];  Service: Cardiovascular;  Laterality: N/A;  . CARDIAC CATHETERIZATION N/A 11/29/2015   Procedure: Coronary Stent Intervention;  Surgeon: Burnell Blanks, MD;  Location: [facility];  Service: Cardiovascular;  Laterality: N/A;  . CATARACT EXTRACTION  2004  . CORONARY ANGIOGRAPHY N/A 02/25/2017   Procedure: Coronary Angiography;  Surgeon: Belva Crome, MD;  Location: [facility];  Service: Cardiovascular;  Laterality: N/A;  . CORONARY ANGIOPLASTY WITH STENT PLACEMENT  2004   RCA  . CORONARY ARTERY BYPASS GRAFT   1989   had LIMA to his LAD, a vein to the circumflex. In 2004 underwent stenting to his right coronary artery.  . CORONARY STENT INTERVENTION N/A 02/24/2017   Procedure: Coronary Stent Intervention;  Surgeon: Wellington Hampshire, MD;  Location: [facility];  Service: Cardiovascular;  Laterality: N/A;  cfx  . HEMORRHOID SURGERY    . INSERT / REPLACE / REMOVE PACEMAKER  07/17/08   DUAL-CHAMBER; PPM-ST.JUDE MEDNET  . LEFT HEART CATH AND CORS/GRAFTS ANGIOGRAPHY N/A 02/24/2017   Procedure: Left Heart Cath and Cors/Grafts Angiography;  Surgeon: Wellington Hampshire, MD;  Location: [facility];  Service: Cardiovascular;  Laterality: N/A;  . LEFT HEART CATHETERIZATION WITH CORONARY/GRAFT ANGIOGRAM N/A 12/10/2011   Procedure: LEFT HEART CATHETERIZATION WITH Beatrix Fetters;  Surgeon: Troy Sine, MD;  Location: [facility] C[facility];  Service: Cardiovascular;  Laterality: N/A;  . LEFT HEART CATHETERIZATION WITH CORONARY/GRAFT ANGIOGRAM N/A 01/26/2014   Procedure: LEFT HEART CATHETERIZATION WITH Beatrix Fetters;  Surgeon: Troy Sine, MD;  Location: [facility];  Service: Cardiovascular;  Laterality: N/A;  . PROSTATECTOMY            Current Outpatient Medications  Medication Sig Dispense Refill  . albuterol (PROVENTIL HFA;VENTOLIN HFA) 108 (90 Base) MCG/ACT inhaler Inhale 2 puffs into the lungs every 4 (four) hours as needed for wheezing or shortness of breath (or coughing). 1 Inhaler 0  . albuterol (PROVENTIL) (2.5 MG/3ML) 0.083% nebulizer solution Take 3 mLs (2.5 mg total) by nebulization every 4 (four) hours as needed for wheezing or shortness of breath. 30 vial 0  . amLODipine (NORVASC) 5 MG tablet Take 1 tablet (5 mg total) by mouth daily. 90 tablet 0  . aspirin 81 MG tablet Take 81 mg by mouth daily.     Marland Kitchen atorvastatin (LIPITOR) 40 MG tablet Take 1 tablet (40 mg total) by mouth daily. 30 tablet 6  . carvedilol (COREG) 25 MG tablet Take 25 mg by mouth 2 (two) times  daily with a meal.      . clopidogrel (PLAVIX) 75 MG tablet Take 75 mg by mouth daily at 3 pm.     . docusate sodium (COLACE) 100 MG capsule Take 1 capsule (100 mg total) by mouth daily as needed for mild constipation. 10 capsule 0  . ezetimibe (ZETIA) 10 MG tablet Take 10 mg by mouth daily.    . ferrous sulfate 325 (65 FE) MG tablet Take 325 mg by mouth 2 (two) times daily with a meal.     . furosemide (LASIX) 20 MG tablet Take 20 mg by mouth daily.    . isosorbide mononitrate (IMDUR) 60 MG 24 hr tablet Take 90mg  (1.5 tablets) in the AM and 30mg  (1/2 tablet) in the PM  180 tablet 3  . latanoprost (XALATAN) 0.005 % ophthalmic solution Place 1 drop into both eyes daily.  4  . levothyroxine (SYNTHROID, LEVOTHROID) 125 MCG tablet TAKE 1 TABLET BY MOUTH EVERY DAY BEFORE BREAKFAST 30 tablet 11  . Menthol, Topical Analgesic, (ICY HOT EX) Apply 1 application topically daily as needed (aches and pains).    . nitroGLYCERIN (NITROSTAT) 0.4 MG SL tablet PLACE 1 TABLET UNDER THE TONGUE AS NEEDED. (Patient taking differently: PLACE 1 TABLET UNDER THE TONGUE AS NEEDED FOR CHEST PAIN.) 25 tablet 3  . pantoprazole (PROTONIX) 40 MG tablet Take 40 mg by mouth 2 (two) times daily.    . ranolazine (RANEXA) 1000 MG SR tablet Take 1 tablet (1,000 mg total) by mouth 2 (two) times daily. 180 tablet 1  . tamsulosin (FLOMAX) 0.4 MG CAPS capsule Take 0.4 mg by mouth daily.  2   No current facility-administered medications for this visit.     Allergies:   Patient has no known allergies.   Social History:  The patient  reports that he has quit smoking. His smoking use included cigarettes. He has a 130.00 pack-year smoking history. He quit smokeless tobacco use about 9 years ago. He reports that he does not drink alcohol or use drugs.   Family History:  The patient's family history includes Diabetes in his father; Heart attack in his brother; Heart disease in his brother and sister; Stroke in his  brother.  ROS:  Please see the history of present illness.  All other systems are reviewed and otherwise negative.   PHYSICAL EXAM: VS:  BP 130/62   Pulse (!) 54   Ht 5\' 6"  (1.676 m)   Wt 172 lb (78 kg)   BMI 27.76 kg/m  BMI: Body mass index is 27.76 kg/m. Well nourished, well developed, in no acute distress  HEENT: normocephalic, atraumatic  Neck: no JVD, carotid bruits or masses Cardiac:  RRR; no significant murmurs, no rubs, or gallops Lungs:  CTA b/l, no wheezing, rhonchi or rales  Abd: soft, nontender MS: no deformity, age appropriate atrophy Ext: trace edema b/l LE Skin: warm and dry, no rash Neuro:  No gross deficits appreciated Psych: euthymic mood, full affect  PPM site is stable, no tethering or discomfort   EKG:  Done today and reviewed by myself: baseline motion, SB 54, Vpaced PPM interrogation done today and reviewed by myself; battery reached ERI 11/13/17, lead measurements are good, 18 AMS, no EGMs  02/25/17: LHC Conclusion   Complicated coronary only procedure due to marked tortuosity within the subclavian and innominate artery preventing catheter control and advancement.  Right coronary is unchanged.  The native left coronary demonstrates wide patency of the stent implanted yesterday.  Chest pain and elevated troponin will need to be further investigated. It is possible that the elevated troponin is a residual of the PCI performed yesterday. The current chest pain complaint is vague and according to the patient, present for quite some time before admission to the hospital.   02/24/17: LHC/PCI  Ost LM to LM lesion, 20 %stenosed.  2nd Mrg lesion, 40 %stenosed.  Mid LAD lesion, 100 %stenosed.  LIMA.  Origin lesion, 100 %stenosed.  Acute Mrg lesion, 90 %stenosed.  Prox RCA-2 lesion, 10 %stenosed.  Dist RCA lesion, 0 %stenosed.  A drug eluting .  Prox RCA-1 lesion, 40 %stenosed.  Ost LAD lesion, 50 %stenosed.  Mid Cx lesion, 30  %stenosed.  Ost 1st Diag to 1st Diag lesion, 30 %stenosed.  There is  mild left ventricular systolic dysfunction.  LV end diastolic pressure is mildly elevated.  The left ventricular ejection fraction is 45-50% by visual estimate.  A STENT RESOLUTE ONYX 2.5X18 drug eluting stent was successfully placed.  Prox Cx lesion, 80 %stenosed.  Post intervention, there is a 0% residual stenosis. 1. Significant native three-vessel coronary artery disease with occluded mid LAD. Patent LIMA to LAD and patent stents in the right coronary artery. Progression of proximal left circumflex disease with known occluded SVG graft to OM. 2. Mildly reduced LV systolic function and mildly elevated left ventricular end-diastolic pressure. 3. Successful angioplasty and drug-eluting stent placement to the proximal left circumflex extending all the way back to the ostium. Recommendations: Dual antiplatelet therapy for at least one year and preferably indefinitely. The patient was noted to have episodes of paroxysmal tachycardia throughout the case likely atrial fibrillation versus atrial tachycardia with a heart rate up to 110 bpm. This might be also responsible for some of his anginal symptoms. I switched amlodipine to diltiazem for better rate control. Difficult cath via the left radial artery due to left radial loop and left subclavian artery tortuosity.  11/28/15: TTE Study Conclusions - Left ventricle: The cavity size was normal. Wall thickness was normal. Systolic function was normal. The estimated ejection fraction was in the range of 50% to 55%. There is hypokinesis of the apical myocardium. - Ventricular septum: Septal motion showed abnormal function and dyssynergy. - Aortic valve: Trileaflet; mildly thickened, mildly calcified leaflets. - Left atrium: The atrium was mildly dilated.   Recent Labs: 05/18/2017: B Natriuretic Peptide 276.5 07/28/2017: ALT 7; BUN 21; Creatinine, Ser 1.49;  Hemoglobin 10.8; Platelets 158; Potassium 4.3; Sodium 141; TSH 2.390  02/24/2017: VLDL 12 07/28/2017: Chol/HDL Ratio 2.2; Cholesterol, Total 128; HDL 59; LDL Calculated 54; Triglycerides 73   CrCl cannot be calculated (Patient's most recent lab result is older than the maximum 21 days allowed.).   Wt Readings from Last 3 Encounters:  12/10/17 172 lb (78 kg)  10/26/17 172 lb 9.6 oz (78.3 kg)  07/27/17 163 lb (73.9 kg)     Other studies reviewed: Additional studies/records reviewed today include: summarized above  ASSESSMENT AND PLAN:  1. PPM     Battery reached ERI 11/13/17     Plan for gen change  2. CAD     Increase in exertional SOB/CP     Exam does not suggest fluid OL   I suspect perhaps a component is/mat be loss of his rate response programming with ERI, discussed with DOD, Dr. Radford Pax.  The patient does not want to consider repeat cath, not unreasonable at 84 y/o.  Go ahead and have him take the Imdur as written 90mg  AM, 30mg  PM, get gen change ASAP (scheduled for 12/15/17) and have him seen by Dr. Claiborne Billings ASAP post generator change to assess further.  The patient/family are instructed if any escalation in symptoms, development of any rest symptoms or unresolved with s/l NTG to seek attention.  3. HTN     Room for the increase in Imdu  4. Hx of PAFib     Not on a/c 2/2 GIB hx     Episode triggers are programmed off, 18 AMS episodes, no EGMs   Disposition: routine post gen change f/u, Dr. Claiborne Billings ASAP as noted above  Current medicines are reviewed at length with the patient today.  The patient did not have any concerns regarding medicines.  Venetia Night, PA-C 12/10/2017 12:57 PM     [facility]  [address] [place] [address] [phone] (office)  [phone] (fax)  EP Attending  Patient seen and examined. Agree with above. He presents today with a PPM at ERI. I have discussed the risks/benefits/goals/expectations of  PPM generator change out and he wishes to proceed.  Mikle Bosworth.D.

## 2017-12-15 NOTE — Progress Notes (Signed)
Dr. Lovena Le and Joseph Art, PA in to see pt.

## 2017-12-15 NOTE — Discharge Instructions (Signed)
Post pacemaker change procedure care Keep area clean and dry for 10 days. No driving for 2 days.  Leave bulky dressing in place until wound check appointment. Call the office [phone]) for redness, drainage, swelling, or fever.  Pacemaker Battery Change, Care After This sheet gives you information about how to care for yourself after your procedure. Your health care provider may also give you more specific instructions. If you have problems or questions, contact your health care provider. What can I expect after the procedure? After your procedure, it is common to have:  Pain or soreness at the site where the pacemaker was inserted.  Swelling at the site where the pacemaker was inserted.  Follow these instructions at home: Incision care  Keep the incision clean and dry. ? Do not take baths, swim, or use a hot tub until your health care provider approves. ? You may shower the day after your procedure, or as directed by your health care provider. ? Pat the area dry with a clean towel. Do not rub the area. This may cause bleeding.  Follow instructions from your health care provider about how to take care of your incision. Make sure you: ? Wash your hands with soap and water before you change your bandage (dressing). If soap and water are not available, use hand sanitizer. ? Change your dressing as told by your health care provider. ? Leave stitches (sutures), skin glue, or adhesive strips in place. These skin closures may need to stay in place for 2 weeks or longer. If adhesive strip edges start to loosen and curl up, you may trim the loose edges. Do not remove adhesive strips completely unless your health care provider tells you to do that.  Check your incision area every day for signs of infection. Check for: ? More redness, swelling, or pain. ? More fluid or blood. ? Warmth. ? Pus or a bad smell. Activity  Do not lift anything that is heavier than 10 lb (4.5 kg) until your health  care provider says it is okay to do so.  For the first 2 weeks, or as long as told by your health care provider: ? Avoid lifting your left arm higher than your shoulder. ? Be gentle when you move your arms over your head. It is okay to raise your arm to comb your hair. ? Avoid strenuous exercise.  Ask your health care provider when it is okay to: ? Resume your normal activities. ? Return to work or school. ? Resume sexual activity. Eating and drinking  Eat a heart-healthy diet. This should include plenty of fresh fruits and vegetables, whole grains, low-fat dairy products, and lean protein like chicken and fish.  Limit alcohol intake to no more than 1 drink a day for non-pregnant women and 2 drinks a day for men. One drink equals 12 oz of beer, 5 oz of wine, or 1 oz of hard liquor.  Check ingredients and nutrition facts on packaged foods and beverages. Avoid the following types of food: ? Food that is high in salt (sodium). ? Food that is high in saturated fat, like full-fat dairy or red meat. ? Food that is high in trans fat, like fried food. ? Food and drinks that are high in sugar. Lifestyle  Do not use any products that contain nicotine or tobacco, such as cigarettes and e-cigarettes. If you need help quitting, ask your health care provider.  Take steps to manage and control your weight.  Get regular exercise. Aim  for 150 minutes of moderate-intensity exercise (such as walking or yoga) or 75 minutes of vigorous exercise (such as running or swimming) each week.  Manage other health problems, such as diabetes or high blood pressure. Ask your health care provider how you can manage these conditions. General instructions  Do not drive for 24 hours after your procedure if you were given a medicine to help you relax (sedative).  Take over-the-counter and prescription medicines only as told by your health care provider.  Avoid putting pressure on the area where the pacemaker was  placed.  If you need an MRI after your pacemaker has been placed, be sure to tell the health care provider who orders the MRI that you have a pacemaker.  Avoid close and prolonged exposure to electrical devices that have strong magnetic fields. These include: ? Cell phones. Avoid keeping them in a pocket near the pacemaker, and try using the ear opposite the pacemaker. ? MP3 players. ? Household appliances, like microwaves. ? Metal detectors. ? Electric generators. ? High-tension wires.  Keep all follow-up visits as directed by your health care provider. This is important. Contact a health care provider if:  You have pain at the incision site that is not relieved by over-the-counter or prescription medicines.  You have any of these around your incision site or coming from it: ? More redness, swelling, or pain. ? Fluid or blood. ? Warmth to the touch. ? Pus or a bad smell.  You have a fever.  You feel brief, occasional palpitations, light-headedness, or any symptoms that you think might be related to your heart. Get help right away if:  You experience chest pain that is different from the pain at the pacemaker site.  You develop a red streak that extends above or below the incision site.  You experience shortness of breath.  You have palpitations or an irregular heartbeat.  You have light-headedness that does not go away quickly.  You faint or have dizzy spells.  Your pulse suddenly drops or increases rapidly and does not return to normal.  You begin to gain weight and your legs and ankles swell. Summary  After your procedure, it is common to have pain, soreness, and some swelling where the pacemaker was inserted.  Make sure to keep your incision clean and dry. Follow instructions from your health care provider about how to take care of your incision.  Check your incision every day for signs of infection, such as more pain or swelling, pus or a bad smell, warmth, or  leaking fluid and blood.  Avoid strenuous exercise and lifting your left arm higher than your shoulder for 2 weeks, or as long as told by your health care provider. This information is not intended to replace advice given to you by your health care provider. Make sure you discuss any questions you have with your health care provider. Document Released: 08/16/2013 Document Revised: 09/17/2016 Document Reviewed: 09/17/2016 Elsevier Interactive Patient Education  2017 [employer].

## 2017-12-15 NOTE — Progress Notes (Signed)
Paged and spoke with [facility] regarding increased use of SL NTG since she saw him on Friday, 2/1. Pt reports taking 5 SL NTG on Saturday, none Sunday or Monday, 1 Tuesday night and 1 this morning. Reports pain relieved each time, denies chest pain at present. Pt reports using inhaler this morning as well as a head/chest cold over the past 2 weeks, wheezing noted today and also reported. Pt denied fever or antibiotic use. Renee, PA for Dr. Lovena Le and Dr. Lovena Le will be by to see pt per [facility]. No new orders received at present. Will monitor.

## 2017-12-16 ENCOUNTER — Encounter (HOSPITAL_COMMUNITY): Payer: Self-pay | Admitting: Internal Medicine

## 2017-12-16 MED FILL — Cefazolin Sodium-Dextrose IV Solution 2 GM/100ML-4%: INTRAVENOUS | Qty: 100 | Status: AC

## 2017-12-16 MED FILL — Gentamicin Sulfate Inj 40 MG/ML: INTRAMUSCULAR | Qty: 80 | Status: AC

## 2017-12-21 ENCOUNTER — Ambulatory Visit (INDEPENDENT_AMBULATORY_CARE_PROVIDER_SITE_OTHER): Payer: Self-pay | Admitting: *Deleted

## 2017-12-21 DIAGNOSIS — I495 Sick sinus syndrome: Secondary | ICD-10-CM

## 2017-12-21 DIAGNOSIS — Z950 Presence of cardiac pacemaker: Secondary | ICD-10-CM

## 2017-12-21 NOTE — Progress Notes (Signed)
Mr. Wickliff presents today to the [facility] for pressure dressing removal and wound assessment s/p PPM generator replacement 12/15/17 by Dr. Lovena Le. Mrs. Westenberger reports that Mr. Lannan has been taking his 81mg  ASA and Plavix daily since the procedure.  Pressure dressing and gauze/tegaderm removed from left chest. Steri-strips remain in place. No swelling or drainage noted. Pocket appears to be stable. Dr. Lovena Le assessed PPM pocket- he is pleased with the progress and has no further recommendations at this time. Mr. Sponaugle will return to the [facility] for his 10-14 day wound check s/p generator replacement on 12/30/17.

## 2017-12-30 ENCOUNTER — Ambulatory Visit (INDEPENDENT_AMBULATORY_CARE_PROVIDER_SITE_OTHER): Payer: Medicare HMO | Admitting: *Deleted

## 2017-12-30 DIAGNOSIS — I495 Sick sinus syndrome: Secondary | ICD-10-CM | POA: Diagnosis not present

## 2017-12-30 LAB — CUP PACEART INCLINIC DEVICE CHECK
Battery Remaining Longevity: 111 mo
Battery Voltage: 3.13 V
Brady Statistic RA Percent Paced: 96 %
Brady Statistic RV Percent Paced: 99.92 %
Implantable Lead Implant Date: 20090908
Lead Channel Impedance Value: 375 Ohm
Lead Channel Pacing Threshold Amplitude: 0.5 V
Lead Channel Pacing Threshold Amplitude: 1.5 V
Lead Channel Sensing Intrinsic Amplitude: 3.3 mV
Lead Channel Setting Pacing Amplitude: 1.5 V
Lead Channel Setting Pacing Pulse Width: 0.4 ms
MDC IDC LEAD IMPLANT DT: 20090908
MDC IDC LEAD LOCATION: 753859
MDC IDC LEAD LOCATION: 753860
MDC IDC MSMT LEADCHNL RA PACING THRESHOLD PULSEWIDTH: 0.4 ms
MDC IDC MSMT LEADCHNL RV IMPEDANCE VALUE: 375 Ohm
MDC IDC MSMT LEADCHNL RV PACING THRESHOLD PULSEWIDTH: 0.4 ms
MDC IDC MSMT LEADCHNL RV SENSING INTR AMPL: 3 mV
MDC IDC PG IMPLANT DT: 20190206
MDC IDC PG SERIAL: 8995118
MDC IDC SESS DTM: 20190221103743
MDC IDC SET LEADCHNL RA PACING AMPLITUDE: 1.5 V
MDC IDC SET LEADCHNL RV SENSING SENSITIVITY: 2 mV

## 2017-12-30 NOTE — Progress Notes (Signed)
Wound check appointment s/p gen change. Steri-strips removed. Wound without redness or edema. Incision edges approximated, wound well healed. Normal device function. Thresholds, sensing, impedances consistent with previous measurements. Device programmed to maximize longevity. 2 mode switches (3.0%)+ ASA d/t GI bleed, longest 10 hours 35 minutes, Peak 265 bpm, Peak V rate 78 bpm. No high ventricular rates noted. Device programmed at appropriate safety margins. Histogram distribution appropriate for patient activity level. Device programmed to optimize intrinsic conduction. Estimated longevity 9.3-10.2 years. Patient education completed. ROV with GT 03/14/18.

## 2017-12-31 ENCOUNTER — Other Ambulatory Visit: Payer: Self-pay | Admitting: Cardiovascular Disease

## 2017-12-31 NOTE — Telephone Encounter (Signed)
REFILL 

## 2018-01-17 ENCOUNTER — Ambulatory Visit: Payer: Medicare HMO | Admitting: Cardiovascular Disease

## 2018-01-17 ENCOUNTER — Encounter: Payer: Self-pay | Admitting: Cardiovascular Disease

## 2018-01-17 VITALS — BP 134/60 | HR 62 | Ht 66.0 in | Wt 169.4 lb

## 2018-01-17 DIAGNOSIS — Z950 Presence of cardiac pacemaker: Secondary | ICD-10-CM

## 2018-01-17 DIAGNOSIS — R0600 Dyspnea, unspecified: Secondary | ICD-10-CM | POA: Diagnosis not present

## 2018-01-17 DIAGNOSIS — E785 Hyperlipidemia, unspecified: Secondary | ICD-10-CM

## 2018-01-17 DIAGNOSIS — I5032 Chronic diastolic (congestive) heart failure: Secondary | ICD-10-CM

## 2018-01-17 DIAGNOSIS — I10 Essential (primary) hypertension: Secondary | ICD-10-CM | POA: Diagnosis not present

## 2018-01-17 DIAGNOSIS — I25118 Atherosclerotic heart disease of native coronary artery with other forms of angina pectoris: Secondary | ICD-10-CM

## 2018-01-17 MED ORDER — AMLODIPINE BESYLATE 10 MG PO TABS: 10.0000 mg | ORAL_TABLET | Freq: Every day | ORAL | 3 refills | Status: DC

## 2018-01-17 NOTE — Progress Notes (Signed)
Patient ID: John Peck, male   DOB: [birth year], 84 y.o.   MRN: [id number]      HPI: John Peck is a 84 y.o. male who presents to the office today for a 3 month follow-up cardiology evaluation.  Mr. Benish has  CAD and underwent CABG surgery with LIMA to the LAD, a vein to the circumflex in 1989.  In 2004 he underwent stenting to his RCA. Cardiac catheterization in January 2013 showed an ejection fraction of 50% with mild inferior, distal inferior and apical lateral hypocontractility. He had significant native CAD with 80% stenosis of the LAD after the second diagonal vessel and septal perforator artery with total occlusion of the mid LAD. He was 85 - 70% ostial stenosis in the circumflex vessel followed by 40% proximal stenosis and 7080% distal stenosis. He had an occluded Y graft which previously sequentially supplied the intermediate and distal circumflex.There was a patent stented proximal RCA with 90% stenosis in the anterior RV marginal branch arising from the mid RCA and had 70-80% stenosis in the distal RCA beyond the crux with 78% stenosis the PDA takeoff. His LIMA to LAD was patent. He has been on medical therapy. He has a history of atrial fibrillation, sick sinus syndrome and status post permanent pacemaker in 2009 which is a St. Jude device. Additional problems include iron deficiency anemia, hypertension, BPH, status post TURP in 1992, remote history of liver abscess, history of COPD.  In March 2015 due to recurrent increasing symptoms of chest pain he underwent repeat cardiac catheterization which revealed mild LV dysfunction with an EF of 45% inferior wall hypocontractility. There was significant multivessel native coronary obstructive disease with 50% ostial LAD stenosis followed by 80% proximal stenosis in the region of the first diagonal takeoff followed by 80% stenosis after the second diagonal vessel; total occlusion of the ramus intermediate vessel with evidence for  bidirectional retrograde collateralization; 40% ostial left circumflex stenosis; and widely patent proximal RCA stent with 80% stenosis in the anterior RV marginal branch, 40% mid or CVA stenosis, 50% distal, and 50% ostial and mid PDA stenoses. He had a patent LIMA graft supplying the mid LAD and an occluded Y graft which previously supplied the intermediate and distal circumflex.  He has been on increased medical regimen consisting of aspirin, Plavix, carvedilol 25 mg twice a day, high dose oral nitrate therapy, Ranexa 1000 g twice a day.  Amlodipine 10 mg in addition to Crestor 20 mg.  He also is taking Lasix 20 mg with supplemental potassium.  He was seen in the office by Lesia Hausen and admitted to bilateral lower extremity pain and weakness.  His blood pressure was somewhat low and his amlodipine dose was reduced from 10 to 5 mg. Lower extremity Doppler studies showed an ABI of 0.5 on the right and 0.58 on the left.  He had mild dilatation of his distal abdominal aorta.  There was equal to less than 50% diameter reduction.  The right SFA and right popliteal artery and there was a greater than 60% diameter reduction in the left SFA.  However, there was one vessel runoff via the peroneal artery bilaterally.  His symptoms are worse in the right lower extremity due to his distal disease.  He has  had done well with his increased medical regimen including Ranexa, high-dose nitrate therapy, amlodipine and carvedilol.  A follow-up Doppler study in August 2015 showed ABI of 0.5 on the right and 0.58 on the left.  He has  poor distal runoff.  His Doppler studies were reviewed with Dr. Gwenlyn Found who agreed with medical therapy.  He already was on aspirin and Plavix  He was hospitalized in January 2017 with chest pain and shortness of breath.  He without for an MI.  He was treated for COPD exacerbation.  He underwent repeat cardiac catheterization by Dr. Julianne Handler.  He was found to have an occluded mid LAD with  presumptive filling of the distal LAD by the LIMA graft, although during this catheterization.  He was unable to selectively engage the LIMA graft.  He had mild proximal disease in the circumflex vessel.  There was a widely patent stent in the RCA, but he developed a new distal RCA stenosis for which he underwent successful intervention.  He also had 90%.  RV marginal stenosis esters Decicco still notes rare to occasional episodes of chest pain which do improve following sl nitroglycerin administration.  He has had some mild blood pressure lability.   When I saw him in 2017, with his episodes of rare to occasional chest pain and blood pressure elevation  I reinitiated amlodipine at 5 mg.  when he was last seen in September 2017.  His blood pressure was improved with the addition of amlodipine.  The past several months, he denies any episodes of chest pain.  He admits to occasional episodes of shortness of breath.  He is unaware of palpitations.  He denies bleeding issues.  He has been on amlodipine at 2.5 mg, carvedilol 25 mg twice a day, isosorbide 120 mg in the morning and 60 mg at night, his internal Ranexa 1000 mg twice a day.  Has been taking furosemide 20 mg daily.  His levothyroxine dose had been increased after his TSH was elevated at 8.29 in September 2017 and on follow-up in October 2017 on 0.125 mg daily TSH was 0.87.   He was admitted to [facility] with increasing episodes of chest pain.  He underwent cardiac catheterization on 02/24/2017 by Dr. Audelia Acton.  His LIMA to LAD was patent.  The vein graft that had supplied the circumflex marginal vessel was occluded and he had 80% ostial circumflex stenosis.  The stents in his RCA proximally and distally were patent.  A Resolute 2.518 mm stent was successfully placed at the circumflex ostium.  He subsequently developed an elevated troponin level and had chest pain in the following day was taken back to the laboratory and repeat catheterization by Dr.  Tamala Julian showed the ostial stent site to be widely patent and no new significant changes from the calf the preceding day.  He has experienced occasional episodes of chest pain since his hospital discharge, which have been nitrate responsive.  He apparently was discharged on aspirin, Plavix, diltiazem 180 mg was started but ultimately was never filled due to concomitant ranolazine administration.  He has been taking isosorbide 30 mg, furosemide 20 mg, addition to his levothyroxine.    For initial follow-up evaluation on 03/08/2017 he was without recurrent chest pain.  I further titrated isosorbide to 60 mg and his blood pressure was elevated and he had continued to experience some mild chest pain. I also recommended resumption of amlodipine.  When last seen in May 2018 cause of some mild symptomatology, I recommended that he take isosorbide 60 mg in the morning and added 30 mg in the evening.    On May 18, 2017  he underwent an ER evaluation for increasing shortness of breath.  His furosemide dose was increased to  40 mg for 3 days and then he resume taking 20 mg daily.   When I last saw him , he had only experienced very rare short-lived episodes of any chest pain which have been minimal and significant improved from previously following his circumflex stent.  He denies any palpitations.  He denies any presyncope or syncope.  Over 2018.  Lipid studies revealed an LDL cholesterol at 46 on atorvastatin 40 mg and Zetia 10 mg.  He continues to be on isosorbide 90 mg in the morning, 30 mg at night, carvedilol 25 mg twice a day amlodipine 5 mg, Ranexa 1000 mg twice a day, which is controlling his angina.    Since I last saw him, his pacemaker reached ERI and he underwent a generator change out by Dr. Lovena Le on 12/15/2017.  Apparently when seen in the office for his EP evaluation.  He was experiencing some symptoms of chest pain but it was hard to be certain and he expressed an interest that he did not want to pursue  another cardiac catheterization.  He is here in the office today with his wife and daughter.  The patient states he is feeling well.  However, family members state that he has experienced episodes of chest pain.  Weeks ago he took 5 nitroglycerin pills on one Saturday.  He has not had recent chest pain but in the past.  His chest pain typically had improved with just one nitroglycerin.  He denies presyncope or syncope.  He has been maintained on isosorbide 90 mg in the morning and 30 mg at night, ranolazine 1000 mg twice a day, amlodipine 5 mg, carvedilol 25 mg twice a day in addition to furosemide 20 mg daily.  He continues to be on antiplatelet therapy with aspirin and Plavix and combined atorvastatin and Zetia for lipid management.  He presents for evaluation.  Past Medical History:  Diagnosis Date  . Anemia   . CAD (coronary artery disease)    a. CABG '89; b. PCI '04; c. 11/2015 Cath/PCI: LM 20ost, LAD 130m LCX 30p, OM2 40, RCA 30p, 10p ISR, 90d (3.0x12 Synergy DES), AM 90, VG->OM2 known to be 100, LIMA->LAD not injected, patent in 2015.  .Marland KitchenChronic combined systolic and diastolic CHF (congestive heart failure) ([facility]   . CKD (chronic kidney disease), stage III ([facility]   . H/O: GI bleed    REMOTE HISTORY  . History of COPD   . Hyperlipidemia   . Hypertensive heart disease   . LBBB (left bundle branch block)   . Pacemaker Sept 2009   St Jude  . PAF (paroxysmal atrial fibrillation) (HCC)    a. not on anticoag due to prior GIB.  . Sick sinus syndrome ([facility] Sept 2009   ST Jude PTVDP    Past Surgical History:  Procedure Laterality Date  . CARDIAC CATHETERIZATION  11/2011   EF 50%; significant native CAD w/80% stenosis of the LAD after 2nd giagonal vessel and septal perforating artery w/total occlusion of the mid left anterior descendiung.; 60-70% ostiasl stenosis in the circumflex vessel followed by 40% proximal stenosis and 70-80% distal circumflex stenosis;   . CARDIAC CATHETERIZATION N/A  11/29/2015   Procedure: Left Heart Cath and Cors/Grafts Angiography;  Surgeon: CBurnell Blanks MD;  [facility];  Service: Cardiovascular;  Laterality: N/A;  . CARDIAC CATHETERIZATION N/A 11/29/2015   Procedure: Coronary Stent Intervention;  Surgeon: CBurnell Blanks MD;  [facility];  Service: Cardiovascular;  Laterality: N/A;  .  CATARACT EXTRACTION  2004  . CORONARY ANGIOGRAPHY N/A 02/25/2017   Procedure: Coronary Angiography;  Surgeon: Belva Crome, MD;  Location: [place] [facility];  Service: Cardiovascular;  Laterality: N/A;  . CORONARY ANGIOPLASTY WITH STENT PLACEMENT  2004   RCA  . CORONARY ARTERY BYPASS GRAFT  1989   had LIMA to his LAD, a vein to the circumflex. In 2004 underwent stenting to his right coronary artery.  . CORONARY STENT INTERVENTION N/A 02/24/2017   Procedure: Coronary Stent Intervention;  Surgeon: Wellington Hampshire, MD;  Location: [place] [facility];  Service: Cardiovascular;  Laterality: N/A;  cfx  . HEMORRHOID SURGERY    . INSERT / REPLACE / REMOVE PACEMAKER  07/17/08   DUAL-CHAMBER; PPM-ST.JUDE MEDNET  . LEFT HEART CATH AND CORS/GRAFTS ANGIOGRAPHY N/A 02/24/2017   Procedure: Left Heart Cath and Cors/Grafts Angiography;  Surgeon: Wellington Hampshire, MD;  Location: [place] [facility];  Service: Cardiovascular;  Laterality: N/A;  . LEFT HEART CATHETERIZATION WITH CORONARY/GRAFT ANGIOGRAM N/A 12/10/2011   Procedure: LEFT HEART CATHETERIZATION WITH Beatrix Fetters;  Surgeon: Troy Sine, MD;  Location: [facility] C[facility] LAB;  Service: Cardiovascular;  Laterality: N/A;  . LEFT HEART CATHETERIZATION WITH CORONARY/GRAFT ANGIOGRAM N/A 01/26/2014   Procedure: LEFT HEART CATHETERIZATION WITH Beatrix Fetters;  Surgeon: Troy Sine, MD;  Location: [facility] CATH LAB;  Service: Cardiovascular;  Laterality: N/A;  . PPM GENERATOR CHANGEOUT N/A 12/15/2017   Procedure: PPM GENERATOR CHANGEOUT;  Surgeon: Evans Lance, MD;  Location: [place] [facility];  Service: Cardiovascular;  Laterality: N/A;  . PROSTATECTOMY      No Known Allergies  Current Outpatient Medications  Medication Sig Dispense Refill  . albuterol (PROVENTIL HFA;VENTOLIN HFA) 108 (90 Base) MCG/ACT inhaler Inhale 2 puffs into the lungs every 4 (four) hours as needed for wheezing or shortness of breath (or coughing). (Patient taking differently: Inhale 1 puff into the lungs every 4 (four) hours as needed for wheezing or shortness of breath (or coughing). ) 1 Inhaler 0  . albuterol (PROVENTIL) (2.5 MG/3ML) 0.083% nebulizer solution Take 3 mLs (2.5 mg total) by nebulization every 4 (four) hours as needed for wheezing or shortness of breath. 30 vial 0  . amLODipine (NORVASC) 10 MG tablet Take 1 tablet (10 mg total) by mouth daily. 90 tablet 3  . aspirin 81 MG tablet Take 81 mg by mouth daily.     Marland Kitchen atorvastatin (LIPITOR) 40 MG tablet Take 1 tablet (40 mg total) by mouth daily. 30 tablet 6  . carvedilol (COREG) 25 MG tablet Take 25 mg by mouth 2 (two) times daily with a meal.      . clopidogrel (PLAVIX) 75 MG tablet Take 75 mg by mouth daily at 3 pm.     . docusate sodium (COLACE) 100 MG capsule Take 1 capsule (100 mg total) by mouth daily as needed for mild constipation. 10 capsule 0  . ezetimibe (ZETIA) 10 MG tablet Take 10 mg by mouth daily.    . ferrous sulfate 325 (65 FE) MG tablet Take 325 mg by mouth 2 (two) times daily with a meal.     . furosemide (LASIX) 20 MG tablet Take 20 mg by mouth daily.    . isosorbide mononitrate (IMDUR) 60 MG 24 hr tablet Take '90mg'$  (1.5 tablets) in the AM and '30mg'$  (1/2 tablet) in the PM (Patient taking differently: Take 30-90 mg by mouth See admin instructions. Take '90mg'$  (1.5 tablets) in the AM and '30mg'$  (1/2 tablet) in  the PM) 180 tablet 3  . latanoprost (XALATAN) 0.005 % ophthalmic solution Place 1 drop into both eyes 2 (two) times a week.   4  . levothyroxine (SYNTHROID, LEVOTHROID) 125 MCG tablet TAKE 1 TABLET BY MOUTH EVERY DAY  BEFORE BREAKFAST (Patient taking differently: TAKE 125 MCG BY MOUTH EVERY DAY BEFORE BREAKFAST) 30 tablet 11  . Menthol, Topical Analgesic, (ICY HOT EX) Apply 1 application topically daily as needed (aches and pains).    . nitroGLYCERIN (NITROSTAT) 0.4 MG SL tablet PLACE 1 TABLET UNDER THE TONGUE AS NEEDED. (Patient taking differently: PLACE 0.4 MG UNDER THE TONGUE AS NEEDED FOR CHEST PAIN.) 25 tablet 3  . pantoprazole (PROTONIX) 40 MG tablet Take 40 mg by mouth 2 (two) times daily.    . ranolazine (RANEXA) 1000 MG SR tablet Take 1 tablet (1,000 mg total) by mouth 2 (two) times daily. 180 tablet 1  . tamsulosin (FLOMAX) 0.4 MG CAPS capsule Take 0.4 mg by mouth daily.  2   No current facility-administered medications for this visit.     Social History   Socioeconomic History  . Marital status: Married    Spouse name: Not on file  . Number of children: 5  . Years of education: Not on file  . Highest education level: Not on file  Social Needs  . Financial resource strain: Not on file  . Food insecurity - worry: Not on file  . Food insecurity - inability: Not on file  . Transportation needs - medical: Not on file  . Transportation needs - non-medical: Not on file  Occupational History  . Occupation: RETIRED    Employer: RETIRED    Comment: TRUCK DRIVER  Tobacco Use  . Smoking status: Former Smoker    Packs/day: 2.00    Years: 65.00    Pack years: 130.00    Types: Cigarettes  . Smokeless tobacco: Former Systems developer    Quit date: 10/29/2008  Substance and Sexual Activity  . Alcohol use: No  . Drug use: No  . Sexual activity: Not on file  Other Topics Concern  . Not on file  Social History Narrative  . Not on file    Family History  Problem Relation Age of Onset  . Diabetes Father   . Heart disease Sister   . Heart disease Brother   . Heart attack Brother   . Stroke Brother   . Hypertension Neg Hx     ROS General: Negative; No fevers, chills, or night sweats;  HEENT:  Negative; No changes in vision or hearing, sinus congestion, difficulty swallowing Pulmonary: Negative; No cough, wheezing, shortness of breath, hemoptysis Cardiovascular: See history of present illness  Positive for claudication, right lower extremity greater than left. GI: Negative; No nausea, vomiting, diarrhea, or abdominal pain GU: Negative; No dysuria, hematuria, or difficulty voiding Musculoskeletal: Negative; no myalgias, joint pain, or weakness Hematologic/Oncology: Negative; no easy bruising, bleeding Endocrine: Negative; no heat/cold intolerance; no diabetes Neuro: Negative; no changes in balance, headaches Skin: Negative; No rashes or skin lesions Psychiatric: Negative; No behavioral problems, depression Sleep: Negative; No snoring, daytime sleepiness, hypersomnolence, bruxism, restless legs, hypnogognic hallucinations, no cataplexy Other comprehensive 14 point system review is negative.   PE BP 134/60   Pulse 62   Ht '5\' 6"'$  (1.676 m)   Wt 169 lb 6.4 oz (76.8 kg)   BMI 27.34 kg/m    Repeat blood pressure was 130/64  Wt Readings from Last 3 Encounters:  01/17/18 169 lb 6.4 oz (76.8 kg)  12/15/17 172 lb (78 kg)  12/10/17 172 lb (78 kg)    General: Alert, oriented, no distress.  Skin: normal turgor, no rashes, warm and dry HEENT: Normocephalic, atraumatic. Pupils equal round and reactive to light; sclera anicteric; extraocular muscles intact; Nose without nasal septal hypertrophy Mouth/Parynx benign; Mallinpatti scale 3 Neck: No JVD, no carotid bruits; normal carotid upstroke Lungs: clear to ausculatation and percussion; no wheezing or rales Chest wall: without tenderness to palpitation Heart: PMI not displaced, RRR, s1 s2 normal, 1/6 systolic murmur, no diastolic murmur, no rubs, gallops, thrills, or heaves Abdomen: soft, nontender; no hepatosplenomehaly, BS+; abdominal aorta nontender and not dilated by palpation. Back: no CVA tenderness Pulses  2+ Musculoskeletal: full range of motion, normal strength, no joint deformities Extremities: no clubbing cyanosis or edema, Homan's sign negative  Neurologic: grossly nonfocal; Cranial nerves grossly wnl Psychologic: Normal mood and affect    ECG (independently read by me): V paced rhythm at 62 bpm.  December 2018 ECG (independently read by me): AV paced rhythm at 66 bpm.  PR interval 206 msec.  PVCs  September 2018 ECG (independently read by me): AV paced rhythm at 60 bpm.  May 2018 ECG (independently read by me): AV paced with 100% capture and a ventricular rate at 60 bpm.  PR interval 202 ms.  03/08/2017 ECG (independently read by me): AV paced rhythm with 100% capture.  Heart rate 60 bpm.  PR interval 206 ms.  01/12/2017 ECG (independently read by me): Paced rhythm with 100% capture at 61 bpm.  September 2017 ECG (independently read by me): AV paced rhythm at 70 bpm.  PR interval 206 ms.  May 2017 ECG (independently read by me): AV paced rhythm at 60 bpm.  PR interval 202 ms.  October 2016 ECG (independently read by me): AV dual paced rhythm with 100% capture.  Ventricular rate 60 bpm.  PR interval 206 ms.  April 2016 ECG  (Independently read by me): Ventricular paced rhythm at 62 bpm; unchanged  October 2015 ECG  (Independently read by me): Ventricular paced rhythm.  Unchanged from prior ECG.  LABS:  BMP Latest Ref Rng & Units 12/10/2017 07/28/2017 05/18/2017  Glucose 65 - 99 mg/dL 87 95 133(H)  BUN 10 - 36 mg/dL 23 21 27(H)  Creatinine 0.76 - 1.27 mg/dL 1.32(H) 1.49(H) 1.53(H)  BUN/Creat Ratio 10 - '24 17 14 '$ -  Sodium 134 - 144 mmol/L 140 141 142  Potassium 3.5 - 5.2 mmol/L 4.5 4.3 4.1  Chloride 96 - 106 mmol/L 101 102 106  CO2 20 - 29 mmol/L '25 24 27  '$ Calcium 8.6 - 10.2 mg/dL 8.7 8.9 8.8(L)    Hepatic Function Latest Ref Rng & Units 07/28/2017 02/23/2017 01/14/2017  Total Protein 6.0 - 8.5 g/dL 6.8 6.7 6.9  Albumin 3.2 - 4.6 g/dL 4.0 3.4(L) 3.9  AST 0 - 40 IU/L '14 30 12   '$ ALT 0 - 44 IU/L 7 14(L) 8(L)  Alk Phosphatase 39 - 117 IU/L 89 82 88  Total Bilirubin 0.0 - 1.2 mg/dL 0.6 1.8(H) 0.9  Bilirubin, Direct 0.1 - 0.5 mg/dL - 0.9(H) -     CBC Latest Ref Rng & Units 12/10/2017 07/28/2017 05/18/2017  WBC 3.4 - 10.8 x10E3/uL 3.9 3.9 5.1  Hemoglobin 13.0 - 17.7 g/dL 10.3(L) 10.8(L) 10.9(L)  Hematocrit 37.5 - 51.0 % 31.8(L) 34.1(L) 33.1(L)  Platelets 150 - 379 x10E3/uL 129(L) 158 127(L)    Lab Results  Component Value Date   TSH 2.390 07/28/2017  BNP    Component Value Date/Time   PROBNP 304.0 (H) 11/12/2008 1154    Lipid Panel     Component Value Date/Time   CHOL 128 07/28/2017 0813   TRIG 73 07/28/2017 0813   HDL 59 07/28/2017 0813   CHOLHDL 2.2 07/28/2017 0813   CHOLHDL 4.3 02/24/2017 0343   VLDL 12 02/24/2017 0343   LDLCALC 54 07/28/2017 0813     RADIOLOGY: No results found.  IMPRESSION:  1. Dyspnea, unspecified type   2. Coronary artery disease of native artery of native heart with stable angina pectoris ([place])   3. Essential hypertension   4. Hyperlipidemia with target LDL less than 70   5. Chronic diastolic CHF (congestive heart failure) ([place])   6. Pacemaker     ASSESSMENT AND PLAN: Mr. Mende is an 84 year old gentleman who underwent initial bypass surgery in 1989 and stenting to his RCA in 2004.  He had been fairly stable on a medical regimen for his CAD until he developed recurrent chest pain symptomatology in the setting of possible COPD exacerbation in January 2017.  Repeat cardiac catheterization at that time revealed progression of his distal RCA which was successfully stented.  He continues to be on medical therapy for his CAD and has documented occluded graft which had supplied his circumflex.  He had been on amlodipine, which was started for elevated blood pressure and when I saw him previously but he had experienced bilateral pedal and ankle edema, right leg greater than left and amlodipine was reduced to 2.5 mg daily.   He was hospitalized in April 2018 with unstable angina.  He underwent repeat catheterization and PCI on 02/24/2017 with successful stenting to the circumflex ostium, which remained patent on repeat evaluation the following day, despite elevation of troponin levels.  He had experienced recurrent anginal symptomatology.  He is now 6 weeks following his pacemaker generator change out after his prior pacemaker had reached ERI.  He has experienced episodes of chest pain and angina despite his current medical regimen.  I have recommended further titration of amlodipine to 10 mg daily and he will continue with isosorbide 90 Milgram's in the morning and 30 Milligan's at night, carvedilol 25 mg twice a day in addition to Ranexa 1000 mg twice a day.  Recent laboratory in October 2018 showed an LDL of 46 on atorvastatin 40 mg and Zetia 10 mg.  At this level of aggressiveness there will be plaque regression.  He is on aspirin and Plavix.  There is no bleeding.  At times he notes shortness of breath.  He continues to be on furosemide.  There are no rales on exam.  I have recommended a follow-up echo Doppler study to reassess his systolic and diastolic function as well as valvular architecture.  He has a history of hypothyroidism on levothyroxine.  I will see him in 3-4 months for reevaluation or sooner if problems arise.  Time spent: 30 minutes  Troy Sine, MD, [facility]  01/19/2018 6:40 PM

## 2018-01-17 NOTE — Patient Instructions (Signed)
Medication Instructions:  INCREASE amlodipine to 10 mg daily  Testing/Procedures: Your physician has requested that you have an echocardiogram. Echocardiography is a painless test that uses sound waves to create images of your heart. It provides your doctor with information about the size and shape of your heart and how well your heart's chambers and valves are working. This procedure takes approximately one hour. There are no restrictions for this procedure.  This will be done at our [facility] location:  [place]: 3 months with Dr. Claiborne Billings  Any Other Special Instructions Will Be Listed Below (If Applicable).     If you need a refill on your cardiac medications before your next appointment, please call your pharmacy.

## 2018-01-19 ENCOUNTER — Telehealth: Payer: Self-pay | Admitting: Cardiovascular Disease

## 2018-01-19 ENCOUNTER — Encounter: Payer: Self-pay | Admitting: Cardiovascular Disease

## 2018-01-19 NOTE — Telephone Encounter (Signed)
Spoke with wife and she is concerned about the message that is listed at the bottom of the patients medication list on his AVS that says:   * This list has 2 medication(s) that are the same as other medications prescribed for you. Read the directions carefully, and ask your doctor or other care provider to review them with you.  Wife advised that this is a notification message for patient safety and because patient is on multiple medication that can treat the same problem that this message will show. Wife advised that the patient's provider is aware of the medications that he is on.

## 2018-01-19 NOTE — Telephone Encounter (Signed)
Dorian Pod (Patient daughter) calling, states that on patient discharge note it states that patient has two medications that maybe the same as the other medications. Dorian Pod would like to discuss patient medication list

## 2018-01-20 NOTE — Telephone Encounter (Signed)
.    There were 2 different versions of albuterol and are on a when necessary basis

## 2018-01-21 NOTE — Telephone Encounter (Signed)
Wife informed

## 2018-01-24 ENCOUNTER — Ambulatory Visit (HOSPITAL_COMMUNITY): Payer: Medicare HMO | Attending: Cardiovascular Disease

## 2018-01-24 ENCOUNTER — Other Ambulatory Visit: Payer: Self-pay

## 2018-01-24 DIAGNOSIS — R0600 Dyspnea, unspecified: Secondary | ICD-10-CM | POA: Diagnosis not present

## 2018-01-24 DIAGNOSIS — I2510 Atherosclerotic heart disease of native coronary artery without angina pectoris: Secondary | ICD-10-CM | POA: Insufficient documentation

## 2018-01-24 DIAGNOSIS — E785 Hyperlipidemia, unspecified: Secondary | ICD-10-CM | POA: Insufficient documentation

## 2018-01-24 DIAGNOSIS — Z87891 Personal history of nicotine dependence: Secondary | ICD-10-CM | POA: Insufficient documentation

## 2018-01-24 DIAGNOSIS — I509 Heart failure, unspecified: Secondary | ICD-10-CM | POA: Insufficient documentation

## 2018-01-24 DIAGNOSIS — D649 Anemia, unspecified: Secondary | ICD-10-CM | POA: Diagnosis not present

## 2018-01-24 DIAGNOSIS — I110 Hypertensive heart disease with heart failure: Secondary | ICD-10-CM | POA: Diagnosis not present

## 2018-02-01 DIAGNOSIS — H6123 Impacted cerumen, bilateral: Secondary | ICD-10-CM | POA: Diagnosis not present

## 2018-02-03 ENCOUNTER — Encounter: Payer: Self-pay | Admitting: Internal Medicine

## 2018-02-10 DIAGNOSIS — I495 Sick sinus syndrome: Secondary | ICD-10-CM | POA: Diagnosis not present

## 2018-02-10 DIAGNOSIS — R269 Unspecified abnormalities of gait and mobility: Secondary | ICD-10-CM | POA: Diagnosis not present

## 2018-02-10 DIAGNOSIS — J449 Chronic obstructive pulmonary disease, unspecified: Secondary | ICD-10-CM | POA: Diagnosis not present

## 2018-02-10 DIAGNOSIS — I25810 Atherosclerosis of coronary artery bypass graft(s) without angina pectoris: Secondary | ICD-10-CM | POA: Diagnosis not present

## 2018-02-10 DIAGNOSIS — E7800 Pure hypercholesterolemia, unspecified: Secondary | ICD-10-CM | POA: Diagnosis not present

## 2018-02-10 DIAGNOSIS — E039 Hypothyroidism, unspecified: Secondary | ICD-10-CM | POA: Diagnosis not present

## 2018-02-10 DIAGNOSIS — Z1389 Encounter for screening for other disorder: Secondary | ICD-10-CM | POA: Diagnosis not present

## 2018-02-10 DIAGNOSIS — N183 Chronic kidney disease, stage 3 (moderate): Secondary | ICD-10-CM | POA: Diagnosis not present

## 2018-02-10 DIAGNOSIS — I10 Essential (primary) hypertension: Secondary | ICD-10-CM | POA: Diagnosis not present

## 2018-02-10 DIAGNOSIS — Z0000 Encounter for general adult medical examination without abnormal findings: Secondary | ICD-10-CM | POA: Diagnosis not present

## 2018-02-10 DIAGNOSIS — R600 Localized edema: Secondary | ICD-10-CM | POA: Diagnosis not present

## 2018-02-11 DIAGNOSIS — E785 Hyperlipidemia, unspecified: Secondary | ICD-10-CM | POA: Diagnosis not present

## 2018-02-11 DIAGNOSIS — D649 Anemia, unspecified: Secondary | ICD-10-CM | POA: Diagnosis not present

## 2018-02-11 DIAGNOSIS — I509 Heart failure, unspecified: Secondary | ICD-10-CM | POA: Diagnosis not present

## 2018-02-11 DIAGNOSIS — I252 Old myocardial infarction: Secondary | ICD-10-CM | POA: Diagnosis not present

## 2018-02-11 DIAGNOSIS — I25119 Atherosclerotic heart disease of native coronary artery with unspecified angina pectoris: Secondary | ICD-10-CM | POA: Diagnosis not present

## 2018-02-11 DIAGNOSIS — E039 Hypothyroidism, unspecified: Secondary | ICD-10-CM | POA: Diagnosis not present

## 2018-02-11 DIAGNOSIS — I4891 Unspecified atrial fibrillation: Secondary | ICD-10-CM | POA: Diagnosis not present

## 2018-02-11 DIAGNOSIS — J449 Chronic obstructive pulmonary disease, unspecified: Secondary | ICD-10-CM | POA: Diagnosis not present

## 2018-02-11 DIAGNOSIS — G8929 Other chronic pain: Secondary | ICD-10-CM | POA: Diagnosis not present

## 2018-02-11 DIAGNOSIS — I110 Hypertensive heart disease with heart failure: Secondary | ICD-10-CM | POA: Diagnosis not present

## 2018-02-21 DIAGNOSIS — R531 Weakness: Secondary | ICD-10-CM | POA: Diagnosis not present

## 2018-02-21 DIAGNOSIS — R404 Transient alteration of awareness: Secondary | ICD-10-CM | POA: Diagnosis not present

## 2018-02-22 ENCOUNTER — Emergency Department (HOSPITAL_COMMUNITY): Payer: Medicare HMO

## 2018-02-22 ENCOUNTER — Observation Stay (HOSPITAL_BASED_OUTPATIENT_CLINIC_OR_DEPARTMENT_OTHER): Payer: Medicare HMO

## 2018-02-22 ENCOUNTER — Other Ambulatory Visit: Payer: Self-pay

## 2018-02-22 ENCOUNTER — Encounter (HOSPITAL_COMMUNITY): Payer: Self-pay

## 2018-02-22 ENCOUNTER — Other Ambulatory Visit (HOSPITAL_COMMUNITY): Payer: Medicare HMO

## 2018-02-22 ENCOUNTER — Observation Stay (HOSPITAL_COMMUNITY)
Admission: EM | Admit: 2018-02-22 | Discharge: 2018-02-24 | Disposition: A | Discharge status: Home / Self Care | Payer: Medicare HMO | Attending: Internal Medicine | Admitting: Internal Medicine

## 2018-02-22 ENCOUNTER — Observation Stay (HOSPITAL_BASED_OUTPATIENT_CLINIC_OR_DEPARTMENT_OTHER)
Admit: 2018-02-22 | Discharge: 2018-02-22 | Disposition: A | Discharge status: Home / Self Care | Payer: Medicare HMO | Attending: Internal Medicine | Admitting: Internal Medicine

## 2018-02-22 DIAGNOSIS — E7849 Other hyperlipidemia: Secondary | ICD-10-CM

## 2018-02-22 DIAGNOSIS — I25118 Atherosclerotic heart disease of native coronary artery with other forms of angina pectoris: Secondary | ICD-10-CM

## 2018-02-22 DIAGNOSIS — J441 Chronic obstructive pulmonary disease with (acute) exacerbation: Secondary | ICD-10-CM | POA: Diagnosis not present

## 2018-02-22 DIAGNOSIS — R29810 Facial weakness: Secondary | ICD-10-CM

## 2018-02-22 DIAGNOSIS — J449 Chronic obstructive pulmonary disease, unspecified: Secondary | ICD-10-CM | POA: Diagnosis not present

## 2018-02-22 DIAGNOSIS — N183 Chronic kidney disease, stage 3 unspecified: Secondary | ICD-10-CM | POA: Diagnosis present

## 2018-02-22 DIAGNOSIS — D696 Thrombocytopenia, unspecified: Secondary | ICD-10-CM | POA: Diagnosis not present

## 2018-02-22 DIAGNOSIS — R531 Weakness: Secondary | ICD-10-CM | POA: Diagnosis not present

## 2018-02-22 DIAGNOSIS — R4781 Slurred speech: Secondary | ICD-10-CM | POA: Diagnosis not present

## 2018-02-22 DIAGNOSIS — Z7902 Long term (current) use of antithrombotics/antiplatelets: Secondary | ICD-10-CM | POA: Diagnosis not present

## 2018-02-22 DIAGNOSIS — Z87891 Personal history of nicotine dependence: Secondary | ICD-10-CM | POA: Diagnosis not present

## 2018-02-22 DIAGNOSIS — I480 Paroxysmal atrial fibrillation: Secondary | ICD-10-CM | POA: Diagnosis not present

## 2018-02-22 DIAGNOSIS — I5042 Chronic combined systolic (congestive) and diastolic (congestive) heart failure: Secondary | ICD-10-CM | POA: Diagnosis not present

## 2018-02-22 DIAGNOSIS — Z79899 Other long term (current) drug therapy: Secondary | ICD-10-CM | POA: Insufficient documentation

## 2018-02-22 DIAGNOSIS — N189 Chronic kidney disease, unspecified: Secondary | ICD-10-CM | POA: Diagnosis not present

## 2018-02-22 DIAGNOSIS — I2510 Atherosclerotic heart disease of native coronary artery without angina pectoris: Secondary | ICD-10-CM | POA: Insufficient documentation

## 2018-02-22 DIAGNOSIS — Z7982 Long term (current) use of aspirin: Secondary | ICD-10-CM | POA: Insufficient documentation

## 2018-02-22 DIAGNOSIS — I129 Hypertensive chronic kidney disease with stage 1 through stage 4 chronic kidney disease, or unspecified chronic kidney disease: Secondary | ICD-10-CM | POA: Diagnosis not present

## 2018-02-22 DIAGNOSIS — I5032 Chronic diastolic (congestive) heart failure: Secondary | ICD-10-CM

## 2018-02-22 DIAGNOSIS — I639 Cerebral infarction, unspecified: Secondary | ICD-10-CM | POA: Diagnosis not present

## 2018-02-22 DIAGNOSIS — Z862 Personal history of diseases of the blood and blood-forming organs and certain disorders involving the immune mechanism: Secondary | ICD-10-CM

## 2018-02-22 DIAGNOSIS — E039 Hypothyroidism, unspecified: Secondary | ICD-10-CM | POA: Insufficient documentation

## 2018-02-22 DIAGNOSIS — I130 Hypertensive heart and chronic kidney disease with heart failure and stage 1 through stage 4 chronic kidney disease, or unspecified chronic kidney disease: Secondary | ICD-10-CM | POA: Diagnosis not present

## 2018-02-22 DIAGNOSIS — R0602 Shortness of breath: Secondary | ICD-10-CM | POA: Diagnosis not present

## 2018-02-22 DIAGNOSIS — E785 Hyperlipidemia, unspecified: Secondary | ICD-10-CM | POA: Diagnosis present

## 2018-02-22 DIAGNOSIS — N179 Acute kidney failure, unspecified: Secondary | ICD-10-CM | POA: Diagnosis not present

## 2018-02-22 DIAGNOSIS — Z950 Presence of cardiac pacemaker: Secondary | ICD-10-CM | POA: Diagnosis not present

## 2018-02-22 DIAGNOSIS — D61818 Other pancytopenia: Secondary | ICD-10-CM | POA: Diagnosis present

## 2018-02-22 DIAGNOSIS — M6281 Muscle weakness (generalized): Secondary | ICD-10-CM | POA: Diagnosis present

## 2018-02-22 DIAGNOSIS — Z951 Presence of aortocoronary bypass graft: Secondary | ICD-10-CM | POA: Diagnosis not present

## 2018-02-22 HISTORY — DX: Unspecified osteoarthritis, unspecified site: M19.90

## 2018-02-22 LAB — I-STAT CHEM 8, ED
BUN: 29 mg/dL — AB (ref 6–20)
CALCIUM ION: 1.09 mmol/L — AB (ref 1.15–1.40)
Chloride: 104 mmol/L (ref 101–111)
Creatinine, Ser: 2 mg/dL — ABNORMAL HIGH (ref 0.61–1.24)
Glucose, Bld: 102 mg/dL — ABNORMAL HIGH (ref 65–99)
HCT: 30 % — ABNORMAL LOW (ref 39.0–52.0)
Hemoglobin: 10.2 g/dL — ABNORMAL LOW (ref 13.0–17.0)
Potassium: 4.9 mmol/L (ref 3.5–5.1)
SODIUM: 139 mmol/L (ref 135–145)
TCO2: 27 mmol/L (ref 22–32)

## 2018-02-22 LAB — COMPREHENSIVE METABOLIC PANEL
ALT: 11 U/L — ABNORMAL LOW (ref 17–63)
ANION GAP: 7 (ref 5–15)
AST: 13 U/L — ABNORMAL LOW (ref 15–41)
Albumin: 3.6 g/dL (ref 3.5–5.0)
Alkaline Phosphatase: 83 U/L (ref 38–126)
BILIRUBIN TOTAL: 0.8 mg/dL (ref 0.3–1.2)
BUN: 28 mg/dL — AB (ref 6–20)
CO2: 26 mmol/L (ref 22–32)
Calcium: 8.4 mg/dL — ABNORMAL LOW (ref 8.9–10.3)
Chloride: 104 mmol/L (ref 101–111)
Creatinine, Ser: 1.94 mg/dL — ABNORMAL HIGH (ref 0.61–1.24)
GFR calc Af Amer: 33 mL/min — ABNORMAL LOW (ref >60)
GFR, EST NON AFRICAN AMERICAN: 29 mL/min — AB (ref >60)
Glucose, Bld: 102 mg/dL — ABNORMAL HIGH (ref 65–99)
Potassium: 4.9 mmol/L (ref 3.5–5.1)
Sodium: 137 mmol/L (ref 135–145)
TOTAL PROTEIN: 6.6 g/dL (ref 6.5–8.1)

## 2018-02-22 LAB — CBC
HEMATOCRIT: 29.9 % — AB (ref 39.0–52.0)
HEMOGLOBIN: 9.9 g/dL — AB (ref 13.0–17.0)
MCH: 31.8 pg (ref 26.0–34.0)
MCHC: 33.1 g/dL (ref 30.0–36.0)
MCV: 96.1 fL (ref 78.0–100.0)
Platelets: 119 10*3/uL — ABNORMAL LOW (ref 150–400)
RBC: 3.11 MIL/uL — ABNORMAL LOW (ref 4.22–5.81)
RDW: 14.4 % (ref 11.5–15.5)
WBC: 4.2 10*3/uL (ref 4.0–10.5)

## 2018-02-22 LAB — DIFFERENTIAL
Basophils Absolute: 0 10*3/uL (ref 0.0–0.1)
Basophils Relative: 0 %
EOS ABS: 0.2 10*3/uL (ref 0.0–0.7)
EOS PCT: 5 %
LYMPHS ABS: 1.1 10*3/uL (ref 0.7–4.0)
Lymphocytes Relative: 25 %
MONO ABS: 0.4 10*3/uL (ref 0.1–1.0)
MONOS PCT: 10 %
Neutro Abs: 2.5 10*3/uL (ref 1.7–7.7)
Neutrophils Relative %: 60 %

## 2018-02-22 LAB — I-STAT TROPONIN, ED: TROPONIN I, POC: 0.01 ng/mL (ref 0.00–0.08)

## 2018-02-22 LAB — PROTIME-INR
INR: 1.26
Prothrombin Time: 15.7 seconds — ABNORMAL HIGH (ref 11.4–15.2)

## 2018-02-22 LAB — URINALYSIS, ROUTINE W REFLEX MICROSCOPIC
BILIRUBIN URINE: NEGATIVE
Glucose, UA: NEGATIVE mg/dL
HGB URINE DIPSTICK: NEGATIVE
KETONES UR: NEGATIVE mg/dL
Leukocytes, UA: NEGATIVE
NITRITE: NEGATIVE
PH: 5 (ref 5.0–8.0)
Protein, ur: NEGATIVE mg/dL
Specific Gravity, Urine: 1.016 (ref 1.005–1.030)

## 2018-02-22 LAB — LIPID PANEL
CHOL/HDL RATIO: 2.4 ratio
CHOLESTEROL: 118 mg/dL (ref 0–200)
HDL: 49 mg/dL (ref >40)
LDL Cholesterol: 58 mg/dL (ref 0–99)
Triglycerides: 56 mg/dL (ref <150)
VLDL: 11 mg/dL (ref 0–40)

## 2018-02-22 LAB — APTT: aPTT: 29 seconds (ref 24–36)

## 2018-02-22 LAB — BRAIN NATRIURETIC PEPTIDE: B Natriuretic Peptide: 433.7 pg/mL — ABNORMAL HIGH (ref 0.0–100.0)

## 2018-02-22 LAB — CREATININE, URINE, RANDOM: Creatinine, Urine: 157.22 mg/dL

## 2018-02-22 LAB — CBG MONITORING, ED: GLUCOSE-CAPILLARY: 102 mg/dL — AB (ref 65–99)

## 2018-02-22 LAB — TROPONIN I: Troponin I: <0.03 ng/mL (ref <0.03)

## 2018-02-22 MED ORDER — AMLODIPINE BESYLATE 5 MG PO TABS: 10.0000 mg | ORAL_TABLET | Freq: Every day | ORAL | Status: DC

## 2018-02-22 MED ORDER — ATORVASTATIN CALCIUM 40 MG PO TABS
40.0000 mg | ORAL_TABLET | Freq: Every day | ORAL | Status: DC
  Administered 2018-02-23 – 2018-02-24 (×2): 40 mg via ORAL
  Filled 2018-02-22 (×2): qty 1

## 2018-02-22 MED ORDER — PANTOPRAZOLE SODIUM 40 MG PO TBEC
40.0000 mg | DELAYED_RELEASE_TABLET | Freq: Two times a day (BID) | ORAL | Status: DC
  Administered 2018-02-22 – 2018-02-24 (×4): 40 mg via ORAL
  Filled 2018-02-22 (×4): qty 1

## 2018-02-22 MED ORDER — ENOXAPARIN SODIUM 30 MG/0.3ML ~~LOC~~ SOLN
30.0000 mg | Freq: Every day | SUBCUTANEOUS | Status: DC
  Administered 2018-02-23 – 2018-02-24 (×2): 30 mg via SUBCUTANEOUS
  Filled 2018-02-22 (×3): qty 0.3

## 2018-02-22 MED ORDER — SENNOSIDES-DOCUSATE SODIUM 8.6-50 MG PO TABS: 1.0000 | ORAL_TABLET | Freq: Every evening | ORAL | Status: DC | PRN

## 2018-02-22 MED ORDER — CLOPIDOGREL BISULFATE 75 MG PO TABS
75.0000 mg | ORAL_TABLET | Freq: Every day | ORAL | Status: DC
  Administered 2018-02-22 – 2018-02-24 (×3): 75 mg via ORAL
  Filled 2018-02-22 (×3): qty 1

## 2018-02-22 MED ORDER — FERROUS SULFATE 325 (65 FE) MG PO TABS
325.0000 mg | ORAL_TABLET | Freq: Two times a day (BID) | ORAL | Status: DC
  Administered 2018-02-23 – 2018-02-24 (×3): 325 mg via ORAL
  Filled 2018-02-22 (×4): qty 1

## 2018-02-22 MED ORDER — NITROGLYCERIN 0.4 MG SL SUBL
0.4000 mg | SUBLINGUAL_TABLET | SUBLINGUAL | Status: DC | PRN
  Administered 2018-02-23: 0.4 mg via SUBLINGUAL
  Filled 2018-02-22: qty 1

## 2018-02-22 MED ORDER — ZOLPIDEM TARTRATE 5 MG PO TABS: 5.0000 mg | ORAL_TABLET | Freq: Every evening | ORAL | Status: DC | PRN

## 2018-02-22 MED ORDER — LEVOTHYROXINE SODIUM 25 MCG PO TABS
125.0000 ug | ORAL_TABLET | Freq: Every day | ORAL | Status: DC
  Administered 2018-02-23 – 2018-02-24 (×2): 125 ug via ORAL
  Filled 2018-02-22 (×4): qty 1

## 2018-02-22 MED ORDER — DOCUSATE SODIUM 100 MG PO CAPS: 100.0000 mg | ORAL_CAPSULE | Freq: Every day | ORAL | Status: DC | PRN

## 2018-02-22 MED ORDER — EZETIMIBE 10 MG PO TABS
10.0000 mg | ORAL_TABLET | Freq: Every day | ORAL | Status: DC
  Administered 2018-02-23 – 2018-02-24 (×2): 10 mg via ORAL
  Filled 2018-02-22 (×2): qty 1

## 2018-02-22 MED ORDER — ACETAMINOPHEN 160 MG/5ML PO SOLN: 650.0000 mg | ORAL | Status: DC | PRN

## 2018-02-22 MED ORDER — TAMSULOSIN HCL 0.4 MG PO CAPS
0.4000 mg | ORAL_CAPSULE | Freq: Every day | ORAL | Status: DC
  Administered 2018-02-22 – 2018-02-24 (×3): 0.4 mg via ORAL
  Filled 2018-02-22 (×3): qty 1

## 2018-02-22 MED ORDER — ALBUTEROL SULFATE (2.5 MG/3ML) 0.083% IN NEBU
2.5000 mg | INHALATION_SOLUTION | RESPIRATORY_TRACT | Status: DC | PRN
  Administered 2018-02-22 – 2018-02-24 (×5): 2.5 mg via RESPIRATORY_TRACT
  Filled 2018-02-22 (×6): qty 3

## 2018-02-22 MED ORDER — ACETAMINOPHEN 650 MG RE SUPP: 650.0000 mg | RECTAL | Status: DC | PRN

## 2018-02-22 MED ORDER — DM-GUAIFENESIN ER 30-600 MG PO TB12
1.0000 | ORAL_TABLET | Freq: Two times a day (BID) | ORAL | Status: DC | PRN
  Administered 2018-02-23: 1 via ORAL
  Filled 2018-02-22: qty 1

## 2018-02-22 MED ORDER — HYDRALAZINE HCL 20 MG/ML IJ SOLN: 5.0000 mg | INTRAMUSCULAR | Status: DC | PRN

## 2018-02-22 MED ORDER — LATANOPROST 0.005 % OP SOLN
1.0000 [drp] | OPHTHALMIC | Status: DC
  Administered 2018-02-24: 1 [drp] via OPHTHALMIC
  Filled 2018-02-22: qty 2.5

## 2018-02-22 MED ORDER — CARVEDILOL 12.5 MG PO TABS: 25.0000 mg | ORAL_TABLET | Freq: Two times a day (BID) | ORAL | Status: DC

## 2018-02-22 MED ORDER — ONDANSETRON HCL 4 MG/2ML IJ SOLN: 4.0000 mg | Freq: Three times a day (TID) | INTRAMUSCULAR | Status: DC | PRN

## 2018-02-22 MED ORDER — RANOLAZINE ER 500 MG PO TB12
1000.0000 mg | ORAL_TABLET | Freq: Two times a day (BID) | ORAL | Status: DC
  Administered 2018-02-22 – 2018-02-24 (×5): 1000 mg via ORAL
  Filled 2018-02-22 (×6): qty 2

## 2018-02-22 MED ORDER — METHYLPREDNISOLONE SODIUM SUCC 125 MG IJ SOLR
125.0000 mg | Freq: Once | INTRAMUSCULAR | Status: AC
  Administered 2018-02-22: 125 mg via INTRAVENOUS
  Filled 2018-02-22: qty 2

## 2018-02-22 MED ORDER — SODIUM CHLORIDE 0.9 % IV SOLN
INTRAVENOUS | Status: DC
  Administered 2018-02-22: 02:00:00 via INTRAVENOUS

## 2018-02-22 MED ORDER — IPRATROPIUM-ALBUTEROL 0.5-2.5 (3) MG/3ML IN SOLN
3.0000 mL | Freq: Three times a day (TID) | RESPIRATORY_TRACT | Status: DC
  Administered 2018-02-22 – 2018-02-23 (×2): 3 mL via RESPIRATORY_TRACT
  Filled 2018-02-22 (×2): qty 3

## 2018-02-22 MED ORDER — IPRATROPIUM-ALBUTEROL 0.5-2.5 (3) MG/3ML IN SOLN
3.0000 mL | Freq: Once | RESPIRATORY_TRACT | Status: AC
  Administered 2018-02-22: 3 mL via RESPIRATORY_TRACT
  Filled 2018-02-22: qty 3

## 2018-02-22 MED ORDER — STROKE: EARLY STAGES OF RECOVERY BOOK
Freq: Once | Status: AC
  Administered 2018-02-23: 02:00:00
  Filled 2018-02-22: qty 1

## 2018-02-22 MED ORDER — MENTHOL (TOPICAL ANALGESIC) 7.5 % (ROLL) EX MISC: Freq: Every day | CUTANEOUS | Status: DC | PRN

## 2018-02-22 MED ORDER — ISOSORBIDE MONONITRATE ER 30 MG PO TB24
30.0000 mg | ORAL_TABLET | Freq: Every evening | ORAL | Status: DC
  Administered 2018-02-23: 30 mg via ORAL
  Filled 2018-02-22 (×2): qty 1

## 2018-02-22 MED ORDER — IPRATROPIUM-ALBUTEROL 0.5-2.5 (3) MG/3ML IN SOLN
3.0000 mL | RESPIRATORY_TRACT | Status: DC
  Administered 2018-02-22 (×2): 3 mL via RESPIRATORY_TRACT
  Filled 2018-02-22 (×2): qty 3

## 2018-02-22 MED ORDER — METHYLPREDNISOLONE SODIUM SUCC 125 MG IJ SOLR
60.0000 mg | Freq: Two times a day (BID) | INTRAMUSCULAR | Status: DC
Start: 2018-02-22 — End: 2018-02-23
  Administered 2018-02-22 – 2018-02-23 (×3): 60 mg via INTRAVENOUS
  Filled 2018-02-22 (×3): qty 2

## 2018-02-22 MED ORDER — ASPIRIN EC 81 MG PO TBEC
81.0000 mg | DELAYED_RELEASE_TABLET | Freq: Every day | ORAL | Status: DC
  Administered 2018-02-23 – 2018-02-24 (×2): 81 mg via ORAL
  Filled 2018-02-22 (×2): qty 1

## 2018-02-22 MED ORDER — ISOSORBIDE MONONITRATE ER 60 MG PO TB24
90.0000 mg | ORAL_TABLET | Freq: Every morning | ORAL | Status: DC
  Administered 2018-02-23 – 2018-02-24 (×2): 90 mg via ORAL
  Filled 2018-02-22 (×2): qty 1

## 2018-02-22 MED ORDER — ACETAMINOPHEN 325 MG PO TABS: 650.0000 mg | ORAL_TABLET | ORAL | Status: DC | PRN

## 2018-02-22 MED ORDER — MUSCLE RUB 10-15 % EX CREA
TOPICAL_CREAM | CUTANEOUS | Status: DC | PRN
  Filled 2018-02-22: qty 85

## 2018-02-22 NOTE — ED Notes (Signed)
Pt requesting neb treatment.

## 2018-02-22 NOTE — ED Notes (Signed)
Pt requested neb treatment before going toUS.

## 2018-02-22 NOTE — ED Notes (Signed)
Pt eating lunch and family at bedsdie. Tolerating diet well.

## 2018-02-22 NOTE — ED Notes (Signed)
Transported to Vascular via stretcher.

## 2018-02-22 NOTE — ED Notes (Signed)
Lab to add on BNP.  °

## 2018-02-22 NOTE — ED Notes (Signed)
Pharmacy contacted to confirm medication and schedyule for pt.

## 2018-02-22 NOTE — ED Notes (Signed)
Pt states he has new onset chest pain at lateral left shoulder"around my pacemaker" on arrival to room. Will contact MD.Aletrt and oriented to time person place and situation.

## 2018-02-22 NOTE — ED Provider Notes (Signed)
TIME SEEN: 1:19 AM  CHIEF COMPLAINT: Generalized weakness  HPI: Patient is a 84 year old male with history of CAD status post CABG, chronic kidney disease, hypertension, hyperlipidemia,  atrial fibrillation, sick sinus syndrome status post pacemaker, COPD not on home oxygen who presents to the emergency department with his family for episode of generalized weakness.  States around 11 PM he was trying to get up from the recliner and felt like both of his legs were very weak and he could not get up.  Family states that they noticed a left-sided facial droop.  They are unsure how long he has had this.  Patient is also concerned because he feels like his speech is "stuttering".  No slurred speech.  No numbness or focal weakness.  He states he did feel very lightheaded without vertigo.  No chest pain.  He has chronic shortness of breath and wheezing which is unchanged.  No fevers, vomiting or diarrhea.  No headache.  No fall or head injury.  She is on Plavix.  Not on anticoagulation.  ROS: See HPI Constitutional: no fever  Eyes: no drainage  ENT: no runny nose   Cardiovascular:  no chest pain  Resp: no SOB  GI: no vomiting GU: no dysuria Integumentary: no rash  Allergy: no hives  Musculoskeletal: no leg swelling  Neurological: no slurred speech ROS otherwise negative  PAST MEDICAL HISTORY/PAST SURGICAL HISTORY:  Past Medical History:  Diagnosis Date  . Anemia   . Arthritis   . CAD (coronary artery disease)    a. CABG '89; b. PCI '04; c. 11/2015 Cath/PCI: LM 20ost, LAD 149m, LCX 30p, OM2 40, RCA 30p, 10p ISR, 90d (3.0x12 Synergy DES), AM 90, VG->OM2 known to be 100, LIMA->LAD not injected, patent in 2015.  Marland Kitchen Chronic combined systolic and diastolic CHF (congestive heart failure) ([place])   . CKD (chronic kidney disease), stage III ([place])   . H/O: GI bleed    REMOTE HISTORY  . History of COPD   . Hyperlipidemia   . Hypertensive heart disease   . LBBB (left bundle branch block)   . Pacemaker  Sept 2009   St Jude  . PAF (paroxysmal atrial fibrillation) (HCC)    a. not on anticoag due to prior GIB.  . Sick sinus syndrome [facility]) Sept 2009   ST Jude PTVDP    MEDICATIONS:  Prior to Admission medications   Medication Sig Start Date End Date Taking? Authorizing Provider  albuterol (PROVENTIL HFA;VENTOLIN HFA) 108 (90 Base) MCG/ACT inhaler Inhale 2 puffs into the lungs every 4 (four) hours as needed for wheezing or shortness of breath (or coughing). Patient taking differently: Inhale 1 puff into the lungs every 4 (four) hours as needed for wheezing or shortness of breath (or coughing).  01/31/39   Delora Fuel, MD  albuterol (PROVENTIL) (2.5 MG/3ML) 0.083% nebulizer solution Take 3 mLs (2.5 mg total) by nebulization every 4 (four) hours as needed for wheezing or shortness of breath. 11/10/70   Delora Fuel, MD  amLODipine (NORVASC) 10 MG tablet Take 1 tablet (10 mg total) by mouth daily. 01/17/18   Troy Sine, MD  aspirin 81 MG tablet Take 81 mg by mouth daily.     [provider]  atorvastatin (LIPITOR) 40 MG tablet Take 1 tablet (40 mg total) by mouth daily. 09/18/15   Troy Sine, MD  carvedilol (COREG) 25 MG tablet Take 25 mg by mouth 2 (two) times daily with a meal.      [provider]  clopidogrel (PLAVIX) 75 MG tablet Take 75 mg by mouth daily at 3 pm.     [provider]  docusate sodium (COLACE) 100 MG capsule Take 1 capsule (100 mg total) by mouth daily as needed for mild constipation. 02/26/17   Leanor Kail, PA  ezetimibe (ZETIA) 10 MG tablet Take 10 mg by mouth daily.    [provider]  ferrous sulfate 325 (65 FE) MG tablet Take 325 mg by mouth 2 (two) times daily with a meal.     [provider]  furosemide (LASIX) 20 MG tablet Take 20 mg by mouth daily.    [provider]  isosorbide mononitrate (IMDUR) 60 MG 24 hr tablet Take 90mg  (1.5 tablets) in the AM and 30mg  (1/2 tablet) in the PM Patient taking  differently: Take 30-90 mg by mouth See admin instructions. Take 90mg  (1.5 tablets) in the AM and 30mg  (1/2 tablet) in the PM 10/26/17   Troy Sine, MD  latanoprost (XALATAN) 0.005 % ophthalmic solution Place 1 drop into both eyes 2 (two) times a week.  11/07/15   [provider]  levothyroxine (SYNTHROID, LEVOTHROID) 125 MCG tablet TAKE 1 TABLET BY MOUTH EVERY DAY BEFORE BREAKFAST Patient taking differently: TAKE 125 MCG BY MOUTH EVERY DAY BEFORE BREAKFAST 03/04/17   Troy Sine, MD  Menthol, Topical Analgesic, (ICY HOT EX) Apply 1 application topically daily as needed (aches and pains).    [provider]  nitroGLYCERIN (NITROSTAT) 0.4 MG SL tablet PLACE 1 TABLET UNDER THE TONGUE AS NEEDED. Patient taking differently: PLACE 0.4 MG UNDER THE TONGUE AS NEEDED FOR CHEST PAIN. 07/06/16   Troy Sine, MD  pantoprazole (PROTONIX) 40 MG tablet Take 40 mg by mouth 2 (two) times daily.    [provider]  ranolazine (RANEXA) 1000 MG SR tablet Take 1 tablet (1,000 mg total) by mouth 2 (two) times daily. 11/27/15   Troy Sine, MD  tamsulosin (FLOMAX) 0.4 MG CAPS capsule Take 0.4 mg by mouth daily. 02/26/15   [provider]    ALLERGIES:  No Known Allergies  SOCIAL HISTORY:  Social History   Tobacco Use  . Smoking status: Former Smoker    Packs/day: 2.00    Years: 65.00    Pack years: 130.00    Types: Cigarettes  . Smokeless tobacco: Former Systems developer    Quit date: 10/29/2008  Substance Use Topics  . Alcohol use: No    FAMILY HISTORY: Family History  Problem Relation Age of Onset  . Diabetes Father   . Heart disease Sister   . Heart disease Brother   . Heart attack Brother   . Stroke Brother   . Hypertension Neg Hx     EXAM: BP 112/67 (BP Location: Right Arm)   Pulse (!) 59   Temp 97.7 F (36.5 C) (Oral)   Resp 14   Ht 5\' 9"  (1.753 m)   Wt 74.8 kg (165 lb)   SpO2 96%   BMI 24.37 kg/m  CONSTITUTIONAL: Alert and oriented and  responds appropriately to questions.  Elderly, in no distress HEAD: Normocephalic EYES: Conjunctivae clear, pupils appear equal, EOMI ENT: normal nose; moist mucous membranes NECK: Supple, no meningismus, no nuchal rigidity, no LAD  CARD: RRR; S1 and S2 appreciated; no murmurs, no clicks, no rubs, no gallops RESP: Normal chest excursion without splinting or tachypnea; breath sounds equal bilaterally but does have scattered expiratory wheezes, no rhonchi, no rales, no hypoxia or respiratory distress, speaking  full sentences ABD/GI: Normal bowel sounds; non-distended; soft, non-tender, no rebound, no guarding, no peritoneal signs, no hepatosplenomegaly BACK:  The back appears normal and is non-tender to palpation, there is no CVA tenderness EXT: Normal ROM in all joints; non-tender to palpation; no edema; normal capillary refill; no cyanosis, no calf tenderness or swelling    SKIN: Normal color for age and race; warm; no rash NEURO: Moves all extremities equally, strength 5/5 in all 4 extremities, cranial nerves II through XII intact, patient does appear to have intermittent episodes of difficulty finding his words but no dysarthria, mild left-sided facial droop noted but otherwise cranial nerves II through XII intact, normal sensation diffusely PSYCH: The patient's mood and manner are appropriate. Grooming and personal hygiene are appropriate.  MEDICAL DECISION MAKING: Patient here with episode of generalized weakness.  He has good strength on exam currently in normal sensation.  Does have some difficulty with word finding intermittently and mild left facial droop.  NIH stroke scale is only between a 1 and 2 and at this time we do not feel he is a TPA candidate given low stroke scale and unsure of last seen normal.  Will obtain labs, head CT, urine.  Differential also includes anemia, electrolyte abnormality, dehydration, UTI.  He does have wheezing on exam.  Will give DuoNeb treatment and obtain chest  x-ray.  EKG shows no ischemic abnormality, arrhythmia.  He has a paced rhythm.  ED PROGRESS: Patient found to have acute on chronic renal failure.  Creatinine has gone up from 1.3 to 1.9 with a GFR 54 now down to 33.  Will begin hydrating patient.  Head CT shows no acute abnormality and chest x-ray shows no acute abnormality.  Will give steroids for mild COPD exacerbation.  He is improving with breathing treatments.  I feel he will need admission for acute on chronic renal failure, workup for his intermittent a facial and left facial droop and COPD exacerbation.  Patient and family are comfortable with this plan.   2:45 AM Discussed patient's case with hospitalist, Dr. Blaine Hamper.  I have recommended admission and patient (and family if present) agree with this plan. Admitting physician will place admission orders.   I reviewed all nursing notes, vitals, pertinent previous records, EKGs, lab and urine results, imaging (as available).       EKG Interpretation  Date/Time:  Tuesday February 22 2018 01:04:22 EDT Ventricular Rate:  63 PR Interval:    QRS Duration: 175 QT Interval:  487 QTC Calculation: 499 R Axis:   -57 Text Interpretation:  AV dual-paced rhythm No significant change since last tracing Confirmed by Ladarrion Telfair, Cyril Mourning [phone]) on 02/22/2018 1:09:16 AM         Aws Shere, Delice Bison, DO 02/22/18 0762

## 2018-02-22 NOTE — ED Notes (Signed)
Patient transported to CT 

## 2018-02-22 NOTE — ED Triage Notes (Addendum)
Per [facility],pt arriving from home after family reported generalized weakness x1 hour and abnormal/weak gait. Family reported left sided facial droop. Pt on plavix. Pt alert and oriented x4. Family reported pt was having trouble getting up from the recliner. Pt has hx of CHF, COPD, stents placement, and pacemaker with ventricular pacing. Pt also reported darkened urine. EMS reports they auscultated wheeze. Pt c/o bilateral leg and hip pain, but reports hx of arthritis.

## 2018-02-22 NOTE — Consult Note (Signed)
Neurology Consultation Reason for Consult: Facial droop Referring Physician: Mora Bellman  CC: Facial droop  History is obtained from: Chart, patient  HPI: John Peck is a 84 y.o. male with history of atrial fibrillation and sick sinus syndrome who underwent pacemaker generator replacement on February 6.  He is on dual antiplatelet therapy for stroke prevention due to history of GI bleeding.  His history of GI bleeding is presumed from iron deficiency anemia dating to 2007.  He had Hemoccult positive stool in 2012 with negative upper and lower endoscopy byDr. Michail Sermon in 2012.  Tonight, he was in his normal state of health when he sat down around 8 PM.  At 11 PM, he tried to stand up and found that he is unable to do so.  His family also noticed facial droop and slurred speech that was new and therefore brought him into the emergency department.  LKW: 11 PM tpa given?: no, mild symptoms Premorbid modified rankin scale: 2   ROS: A 14 point ROS was performed and is negative except as noted in the HPI.   Past Medical History:  Diagnosis Date  . Anemia   . Arthritis   . CAD (coronary artery disease)    a. CABG '89; b. PCI '04; c. 11/2015 Cath/PCI: LM 20ost, LAD 137m, LCX 30p, OM2 40, RCA 30p, 10p ISR, 90d (3.0x12 Synergy DES), AM 90, VG->OM2 known to be 100, LIMA->LAD not injected, patent in 2015.  Marland Kitchen Chronic combined systolic and diastolic CHF (congestive heart failure) ([place])   . CKD (chronic kidney disease), stage III ([place])   . H/O: GI bleed    REMOTE HISTORY  . History of COPD   . Hyperlipidemia   . Hypertensive heart disease   . LBBB (left bundle branch block)   . Pacemaker Sept 2009   St Jude  . PAF (paroxysmal atrial fibrillation) (HCC)    a. not on anticoag due to prior GIB.  . Sick sinus syndrome [facility]) Sept 2009   ST Jude PTVDP     Family History  Problem Relation Age of Onset  . Diabetes Father   . Heart disease Sister   . Heart disease Brother   . Heart attack  Brother   . Stroke Brother   . Hypertension Neg Hx      Social History:  reports that he has quit smoking. His smoking use included cigarettes. He has a 130.00 pack-year smoking history. He quit smokeless tobacco use about 9 years ago. He reports that he does not drink alcohol or use drugs.  Exam: Current vital signs: BP (!) 127/57   Pulse (!) 58   Temp 97.7 F (36.5 C)   Resp (!) 22   Ht 5\' 9"  (1.753 m)   Wt 74.8 kg (165 lb)   SpO2 98%   BMI 24.37 kg/m  Vital signs in last 24 hours: Temp:  [97.7 F (36.5 C)] 97.7 F (36.5 C) (04/16 0253) Pulse Rate:  [58-78] 58 (04/16 0330) Resp:  [14-28] 22 (04/16 0330) BP: (112-139)/(57-71) 127/57 (04/16 0330) SpO2:  [95 %-100 %] 98 % (04/16 0330) Weight:  [74.8 kg (165 lb)] 74.8 kg (165 lb) (04/16 0058)   Physical Exam  Constitutional: Appears well-developed and well-nourished.  Psych: Affect appropriate to situation Eyes: No scleral injection HENT: No OP obstrucion Head: Normocephalic.  Cardiovascular: Normal rate and regular rhythm.  Respiratory: Effort normal, non-labored breathing GI: Soft.  No distension. There is no tenderness.  Skin: WDI  Neuro: Mental Status: Patient  is awake, alert, oriented to person, place, month, year, and situation. Patient is able to give a clear and coherent history. No signs of aphasia or neglect Cranial Nerves: II: Visual Fields are full. Pupils are small bilaterally but reactive III,IV, VI: EOMI without ptosis or diploplia.  V: Facial sensation is symmetric to temperature VII: Facial movement with mildly decreased nasolabial fold on the left VIII: hearing is intact to voice X: Uvula elevates symmetrically XI: Shoulder shrug is symmetric. XII: tongue is midline without atrophy or fasciculations.  Motor: Tone is normal. Bulk is normal. 5/5 strength was present in all four extremities.  Sensory: Sensation is symmetric to light touch and temperature in the arms and legs. Cerebellar: He  has intentional tremor bilaterally, but relatively symmetric.   I have reviewed labs in epic and the results pertinent to this consultation are: Elevated creatinine at 1.94  I have reviewed the images obtained: CT head-unremarkable  Impression: 84 year old male with difficulty standing up due to shortness of breath per patient.  Apparently the facial droop and dysarthria are new, and certainly he is at risk of ischemic stroke due to atrial fibrillation without anticoagulation.  He reports that he has not fallen in at least a year and therefore I am not certain that his fall risk outweighs the benefits of anticoagulation, however when factoring in a history of GI bleeding this does become a  more complicated decision-making process.  Recommendations: 1) repeat CT in 48 hours 2) echo, carotids 3) lipid panel, A1c 4) consider GI consult for consideration of anticoagulation 5) continue aspirin and Plavix for now 6) stroke team to follow  Roland Rack, MD [facility] [phone]  If 7pm- 7am, please page neurology on call as listed in [place].

## 2018-02-22 NOTE — ED Notes (Signed)
Dr, Verlon Au contacted to confirm meds. States to hold coreg and norvasc

## 2018-02-22 NOTE — ED Notes (Signed)
Pt's lunch tray arrived. 

## 2018-02-22 NOTE — ED Notes (Signed)
CBG 102 

## 2018-02-22 NOTE — H&P (Signed)
History and Physical    John Peck IDP:[id number] DOB: 1924-05-31 DOA: 02/22/2018  Referring MD/NP/PA:   PCP: Seward Carol, MD   Patient coming from:  The patient is coming from home.  At baseline, pt is partially dependent for most of ADL.        Chief Complaint: left facial droop, difficulty speaking, abnormal gait and leg weakness  HPI: John Peck is a 84 y.o. male with medical history significant of PAF not on AC due to GIB, hypertension, hyperlipidemia, COPD, GERD, hypothyroidism, sick sinus syndrome, pacemaker placement, left bundle blockage, CKD-3, CHF with EF of 40%, CAD, stent placement, CABG, and deficiency anemia, who presents with left facial droop, difficulty speaking, abnormal gait and leg weakness.  Per EDP, family reported that pt was found to have new and left facial droop, abnormal gait and difficulty speaking at about 11 PM. LKN was 8:00 PM. Pt he states that he has a weakness in both legs. No vision change or hearing loss. He denies numbness or tingling seen in extremities. He states that he has a shortness breath and dry cough. No chest pain, fever or chills. He states that He sometimes has mild dysuria and discomfort on urination. Denies nausea, vomiting, diarrhea, abdominal pain.  ED Course: pt was found to have WBC 4.2,negative troponin, BNP 433.7, worsening renal function,negative urinalysis,temperature normal, bradycardia, no tachypnea, oxygen saturation section 96% on room air, chest x-ray showed trace pleural effusion, negative CT head for acute intracranial abnormalities. Patient is placed on telemetry bed for observation. Neurology, Dr. Leonel Ramsay was consulted.  Review of Systems:   General: no fevers, chills, no body weight gain, has poor appetite, has fatigue HEENT: no blurry vision, hearing changes or sore throat Respiratory: has dyspnea, coughing, wheezing CV: no chest pain, no palpitations GI: no nausea, vomiting, abdominal pain, diarrhea,  constipation GU: no dysuria, burning on urination, increased urinary frequency, hematuria  Ext: has leg edema Neuro: no unilateral weakness, numbness, or tingling, no vision change or hearing loss. Has left facial droop, difficulty speaking Skin: no rash, no skin tear. MSK: No muscle spasm, no deformity, no limitation of range of movement in spin Heme: No easy bruising.  Travel history: No recent long distant travel.  Allergy: No Known Allergies  Past Medical History:  Diagnosis Date  . Anemia   . Arthritis   . CAD (coronary artery disease)    a. CABG '89; b. PCI '04; c. 11/2015 Cath/PCI: LM 20ost, LAD 177m, LCX 30p, OM2 40, RCA 30p, 10p ISR, 90d (3.0x12 Synergy DES), AM 90, VG->OM2 known to be 100, LIMA->LAD not injected, patent in 2015.  Marland Kitchen Chronic combined systolic and diastolic CHF (congestive heart failure) ([place])   . CKD (chronic kidney disease), stage III ([place])   . H/O: GI bleed    REMOTE HISTORY  . History of COPD   . Hyperlipidemia   . Hypertensive heart disease   . LBBB (left bundle branch block)   . Pacemaker Sept 2009   St Jude  . PAF (paroxysmal atrial fibrillation) (HCC)    a. not on anticoag due to prior GIB.  . Sick sinus syndrome [facility]) Sept 2009   ST Jude PTVDP    Past Surgical History:  Procedure Laterality Date  . CARDIAC CATHETERIZATION  11/2011   EF 50%; significant native CAD w/80% stenosis of the LAD after 2nd giagonal vessel and septal perforating artery w/total occlusion of the mid left anterior descendiung.; 60-70% ostiasl stenosis in the circumflex vessel followed by  40% proximal stenosis and 70-80% distal circumflex stenosis;   . CARDIAC CATHETERIZATION N/A 11/29/2015   Procedure: Left Heart Cath and Cors/Grafts Angiography;  Surgeon: Burnell Blanks, MD;  Location: [place] [facility];  Service: Cardiovascular;  Laterality: N/A;  . CARDIAC CATHETERIZATION N/A 11/29/2015   Procedure: Coronary Stent Intervention;  Surgeon: Burnell Blanks, MD;   Location: [place] [facility];  Service: Cardiovascular;  Laterality: N/A;  . CATARACT EXTRACTION  2004  . CORONARY ANGIOGRAPHY N/A 02/25/2017   Procedure: Coronary Angiography;  Surgeon: Belva Crome, MD;  Location: [place] [facility];  Service: Cardiovascular;  Laterality: N/A;  . CORONARY ANGIOPLASTY WITH STENT PLACEMENT  2004   RCA  . CORONARY ARTERY BYPASS GRAFT  1989   had LIMA to his LAD, a vein to the circumflex. In 2004 underwent stenting to his right coronary artery.  . CORONARY STENT INTERVENTION N/A 02/24/2017   Procedure: Coronary Stent Intervention;  Surgeon: Wellington Hampshire, MD;  Location: [place] [facility];  Service: Cardiovascular;  Laterality: N/A;  cfx  . HEMORRHOID SURGERY    . INSERT / REPLACE / REMOVE PACEMAKER  07/17/08   DUAL-CHAMBER; PPM-ST.JUDE MEDNET  . LEFT HEART CATH AND CORS/GRAFTS ANGIOGRAPHY N/A 02/24/2017   Procedure: Left Heart Cath and Cors/Grafts Angiography;  Surgeon: Wellington Hampshire, MD;  Location: [place] [facility];  Service: Cardiovascular;  Laterality: N/A;  . LEFT HEART CATHETERIZATION WITH CORONARY/GRAFT ANGIOGRAM N/A 12/10/2011   Procedure: LEFT HEART CATHETERIZATION WITH Beatrix Fetters;  Surgeon: Troy Sine, MD;  Location[facility] C[facility] LAB;  Service: Cardiovascular;  Laterality: N/A;  . LEFT HEART CATHETERIZATION WITH CORONARY/GRAFT ANGIOGRAM N/A 01/26/2014   Procedure: LEFT HEART CATHETERIZATION WITH Beatrix Fetters;  Surgeon: Troy Sine, MD;  Location: [facility] CATH LAB;  Service: Cardiovascular;  Laterality: N/A;  . PPM GENERATOR CHANGEOUT N/A 12/15/2017   Procedure: PPM GENERATOR CHANGEOUT;  Surgeon: Evans Lance, MD;  Location: [place] [facility];  Service: Cardiovascular;  Laterality: N/A;  . PROSTATECTOMY      Social History:  reports that he has quit smoking. His smoking use included cigarettes. He has a 130.00 pack-year smoking history. He quit smokeless tobacco use about 9 years ago. He reports that he does not drink  alcohol or use drugs.  Family History:  Family History  Problem Relation Age of Onset  . Diabetes Father   . Heart disease Sister   . Heart disease Brother   . Heart attack Brother   . Stroke Brother   . Hypertension Neg Hx      Prior to Admission medications   Medication Sig Start Date End Date Taking? Authorizing Provider  albuterol (PROVENTIL HFA;VENTOLIN HFA) 108 (90 Base) MCG/ACT inhaler Inhale 2 puffs into the lungs every 4 (four) hours as needed for wheezing or shortness of breath (or coughing). Patient taking differently: Inhale 1 puff into the lungs every 4 (four) hours as needed for wheezing or shortness of breath (or coughing).  3/66/29   Delora Fuel, MD  albuterol (PROVENTIL) (2.5 MG/3ML) 0.083% nebulizer solution Take 3 mLs (2.5 mg total) by nebulization every 4 (four) hours as needed for wheezing or shortness of breath. 4/76/54   Delora Fuel, MD  amLODipine (NORVASC) 10 MG tablet Take 1 tablet (10 mg total) by mouth daily. 01/17/18   Troy Sine, MD  aspirin 81 MG tablet Take 81 mg by mouth daily.     [provider]  atorvastatin (LIPITOR) 40 MG tablet Take 1  tablet (40 mg total) by mouth daily. 09/18/15   Troy Sine, MD  carvedilol (COREG) 25 MG tablet Take 25 mg by mouth 2 (two) times daily with a meal.      [provider]  clopidogrel (PLAVIX) 75 MG tablet Take 75 mg by mouth daily at 3 pm.     [provider]  docusate sodium (COLACE) 100 MG capsule Take 1 capsule (100 mg total) by mouth daily as needed for mild constipation. 02/26/17   Leanor Kail, PA  ezetimibe (ZETIA) 10 MG tablet Take 10 mg by mouth daily.    [provider]  ferrous sulfate 325 (65 FE) MG tablet Take 325 mg by mouth 2 (two) times daily with a meal.     [provider]  furosemide (LASIX) 20 MG tablet Take 20 mg by mouth daily.    [provider]  isosorbide mononitrate (IMDUR) 60 MG 24 hr tablet Take 90mg  (1.5 tablets) in the AM  and 30mg  (1/2 tablet) in the PM Patient taking differently: Take 30-90 mg by mouth See admin instructions. Take 90mg  (1.5 tablets) in the AM and 30mg  (1/2 tablet) in the PM 10/26/17   Troy Sine, MD  latanoprost (XALATAN) 0.005 % ophthalmic solution Place 1 drop into both eyes 2 (two) times a week.  11/07/15   [provider]  levothyroxine (SYNTHROID, LEVOTHROID) 125 MCG tablet TAKE 1 TABLET BY MOUTH EVERY DAY BEFORE BREAKFAST Patient taking differently: TAKE 125 MCG BY MOUTH EVERY DAY BEFORE BREAKFAST 03/04/17   Troy Sine, MD  Menthol, Topical Analgesic, (ICY HOT EX) Apply 1 application topically daily as needed (aches and pains).    [provider]  nitroGLYCERIN (NITROSTAT) 0.4 MG SL tablet PLACE 1 TABLET UNDER THE TONGUE AS NEEDED. Patient taking differently: PLACE 0.4 MG UNDER THE TONGUE AS NEEDED FOR CHEST PAIN. 07/06/16   Troy Sine, MD  pantoprazole (PROTONIX) 40 MG tablet Take 40 mg by mouth 2 (two) times daily.    [provider]  ranolazine (RANEXA) 1000 MG SR tablet Take 1 tablet (1,000 mg total) by mouth 2 (two) times daily. 11/27/15   Troy Sine, MD  tamsulosin (FLOMAX) 0.4 MG CAPS capsule Take 0.4 mg by mouth daily. 02/26/15   [provider]    Physical Exam: Vitals:   02/22/18 0253 02/22/18 0300 02/22/18 0315 02/22/18 0330  BP:  139/62 124/71 (!) 127/57  Pulse:  (!) 59 (!) 59 (!) 58  Resp:  17 (!) 28 (!) 22  Temp: 97.7 F (36.5 C)     TempSrc:      SpO2:  97% 97% 98%  Weight:      Height:       General: Not in acute distress HEENT:       Eyes: PERRL, EOMI, no scleral icterus.       ENT: No discharge from the ears and nose, no pharynx injection, no tonsillar enlargement.        Neck: No JVD, no bruit, no mass felt. Heme: No neck lymph node enlargement. Cardiac: S1/S2, RRR, No murmurs, No gallops or rubs. Respiratory: has mild wheezing bilaterally GI: Soft, nondistended, nontender, no rebound pain, no organomegaly,  BS present. GU: No hematuria Ext: 1+ pitting leg edema bilaterally. 2+DP/PT pulse bilaterally. Musculoskeletal: No joint deformities, No joint redness or warmth, no limitation of ROM in spin. Skin: No rashes.  Neuro: Alert, oriented X3, cranial nerves II-XII grossly intact except for left facial droop and slurred  speech, Muscle strength 5/5 in all extremities, sensation to light touch intact. Brachial reflex 2+ bilaterally.  Negative Babinski's sign.  Psych: Patient is not psychotic, no suicidal or hemocidal ideation.  Labs on Admission: I have personally reviewed following labs and imaging studies  CBC: Recent Labs  Lab 02/22/18 0115 02/22/18 0121  WBC 4.2  --   NEUTROABS 2.5  --   HGB 9.9* 10.2*  HCT 29.9* 30.0*  MCV 96.1  --   PLT 119*  --    Basic Metabolic Panel: Recent Labs  Lab 02/22/18 0115 02/22/18 0121  NA 137 139  K 4.9 4.9  CL 104 104  CO2 26  --   GLUCOSE 102* 102*  BUN 28* 29*  CREATININE 1.94* 2.00*  CALCIUM 8.4*  --    GFR: Estimated Creatinine Clearance: 24.1 mL/min (A) (by C-G formula based on SCr of 2 mg/dL (H)). Liver Function Tests: Recent Labs  Lab 02/22/18 0115  AST 13*  ALT 11*  ALKPHOS 83  BILITOT 0.8  PROT 6.6  ALBUMIN 3.6   No results for input(s): LIPASE, AMYLASE in the last 168 hours. No results for input(s): AMMONIA in the last 168 hours. Coagulation Profile: Recent Labs  Lab 02/22/18 0115  INR 1.26   Cardiac Enzymes: No results for input(s): CKTOTAL, CKMB, CKMBINDEX, TROPONINI in the last 168 hours. BNP (last 3 results) No results for input(s): PROBNP in the last 8760 hours. HbA1C: No results for input(s): HGBA1C in the last 72 hours. CBG: Recent Labs  Lab 02/22/18 0114  GLUCAP 102*   Lipid Profile: Recent Labs    02/22/18 0115  CHOL 118  HDL 49  LDLCALC 58  TRIG 56  CHOLHDL 2.4   Thyroid Function Tests: No results for input(s): TSH, T4TOTAL, FREET4, T3FREE, THYROIDAB in the last 72 hours. Anemia  Panel: No results for input(s): VITAMINB12, FOLATE, FERRITIN, TIBC, IRON, RETICCTPCT in the last 72 hours. Urine analysis:    Component Value Date/Time   COLORURINE AMBER (A) 02/22/2018 0156   APPEARANCEUR CLEAR 02/22/2018 0156   LABSPEC 1.016 02/22/2018 0156   PHURINE 5.0 02/22/2018 0156   GLUCOSEU NEGATIVE 02/22/2018 0156   HGBUR NEGATIVE 02/22/2018 0156   BILIRUBINUR NEGATIVE 02/22/2018 0156   KETONESUR NEGATIVE 02/22/2018 0156   PROTEINUR NEGATIVE 02/22/2018 0156   UROBILINOGEN 0.2 05/09/2015 1523   NITRITE NEGATIVE 02/22/2018 0156   LEUKOCYTESUR NEGATIVE 02/22/2018 0156   Sepsis Labs: @LABRCNTIP (procalcitonin:4,lacticidven:4) )No results found for this or any previous visit (from the past 240 hour(s)).   Radiological Exams on Admission: Dg Chest 2 View  Result Date: 02/22/2018 CLINICAL DATA:  Wheezing with shortness of breath EXAM: CHEST - 2 VIEW COMPARISON:  05/18/2017 FINDINGS: Post sternotomy changes. Left-sided pacing device as before. Trace pleural effusions. No focal consolidation. Mild cardiomegaly with aortic atherosclerosis. No pneumothorax. IMPRESSION: 1. Trace pleural effusions. 2. Mild cardiomegaly Electronically Signed   By: Donavan Foil M.D.   On: 02/22/2018 02:15   Ct Head Wo Contrast  Result Date: 02/22/2018 CLINICAL DATA:  Acute onset of generalized weakness. Abnormal gait. Left-sided facial droop. EXAM: CT HEAD WITHOUT CONTRAST TECHNIQUE: Contiguous axial images were obtained from the base of the skull through the vertex without intravenous contrast. COMPARISON:  CT of the head performed 05/09/2015 FINDINGS: Brain: No evidence of acute infarction, hemorrhage, hydrocephalus, extra-axial collection or mass lesion / mass effect. Prominence of the ventricles and sulci reflects moderate cortical volume loss. Cerebellar atrophy is noted. Scattered periventricular and subcortical white matter change likely reflects small  vessel ischemic microangiopathy. The brainstem and  fourth ventricle are within normal limits. The basal ganglia are unremarkable in appearance. The cerebral hemispheres demonstrate grossly normal gray-white differentiation. No mass effect or midline shift is seen. Vascular: No hyperdense vessel or unexpected calcification. Skull: There is no evidence of fracture; visualized osseous structures are unremarkable in appearance. Sinuses/Orbits: The orbits are within normal limits. There is partial opacification of the right ethmoid air cells. The paranasal sinuses and mastoid air cells are well-aerated. Other: No significant soft tissue abnormalities are seen. IMPRESSION: 1. No acute intracranial pathology seen on CT. 2. Moderate cortical volume loss and scattered small vessel ischemic microangiopathy. Electronically Signed   By: Garald Balding M.D.   On: 02/22/2018 02:10     EKG: Independently reviewed.  Sinus rhythm, QTC 497, paced rhythm   Assessment/Plan Principal Problem:   Facial droop Active Problems:   S/P CABG x 2 '89. RCA stent'04. last cath Jan 2013   PACEMAKER, PERMANENT- St Jude Sept 2009   History of iron deficiency anemia   PAF (paroxysmal atrial fibrillation) (HCC)   Chronic diastolic CHF (congestive heart failure) (HCC)   Acute renal failure superimposed on stage 3 chronic kidney disease ([place])   Hyperlipidemia   COPD with acute exacerbation (HCC)   Thrombocytopenia (HCC)   Facial droop: pt presents with left facial droop, difficulty speaking, abnormal gait and leg weakness. Concerning for stroke. Cannot do MRI due to pacemaker placement. Cannot do CT angiogram due to worsening renal function. Neurology, Dr. Leonel Ramsay was consulted, who recommended to repeat CT of head in 48 hours. - will place on tele bed for obs  - Highly appreciated neurologist's consultation-->need to repeat CT-head in 48 hours - Check carotid dopplers  - Continue Plavix and add ASA  - fasting lipid panel and HbA1c  - 2D transthoracic echocardiography  -  PT/OT consult  Mild COPD exacerbation: pt has wheezing on auscultation,  but no productive cough, no infiltration on chest x-ray. Patient has mild COPD exacerbation. -Nebulizers: scheduled Duoneb and prn albuterol -Nebulizers: scheduled Atrovent and prn Xopenex Nebs -Solu-Medrol 60 mg IV bid -Mucinex for cough   Hx of CAD: s/P CABG x 2 '89. RCA stent'04. last cath Jan 2013. No CP -on ASA, Zetia, lipitor, coreg -When necessary nitroglycerin  History of iron deficiency anemia: hgb stable, 10.2 - continue ron supplement.  PAF (paroxysmal atrial fibrillation) ([place]): CHA2DS2-VASc Score is 5 (or 7 if pt has stroke today), needs oral anticoagulation, but pt is not on AC due to hx of GIB. Heart rate is well controlled. Per Dr. Leonel Ramsay, may need to discuss with GI for possibly starting AC. -continue coreg  Chronic diastolic CHF: pt has 1+ leg edema and BNP is 433.7, but not JVD. No pulmonary edema on chest x-ray. He has mild fluid overload, but does not seem to have for acute CHF exacerbation. -Hold Lasix due to worsening renal function  AoCKD-III: Baseline Cre is 1.3-1.5, pt's Cre 2.00 and BUN 29 on admission. Likely due to prerenal secondary to dehydration and continuation of diuretics - Check FeUrea - Follow up renal function by BMP - Hold lasix  HLD: -zetia and lipitor  Thrombocytopenia ([place]): this is and chronic issue, and the etiology. Patient is a 119. No bleeding tendency. -Follow-up by CBC  DVT ppx:  SQ Lovenox Code Status: Full code (I attempted to discuss CODE STATUS with patient, but the patient cannot give clear answers. Will put full code now. Will need to discussed with POA or  family in the morning).  Family Communication: None at bed side.   Disposition Plan:  Anticipate discharge back to previous home environment Consults called:  Neurology, Dr. Leonel Ramsay Admission status: Obs / tele    Date of Service 02/22/2018    Ivor Costa [facility] Pager  [phone]  If 7PM-7AM, please contact night-coverage [email or web address] Password [email or web address] 02/22/2018, 4:55 AM

## 2018-02-22 NOTE — Progress Notes (Signed)
Stroke Team Progress Note  John Peck is a 84 y.o. male with history of atrial fibrillation and sick sinus syndrome who underwent pacemaker generator replacement on February 6.  He is on dual antiplatelet therapy for stroke prevention due to history of GI bleeding.  His history of GI bleeding is presumed from iron deficiency anemia dating to 2007.  He had Hemoccult positive stool in 2012 with negative upper and lower endoscopy byDr. Michail Sermon in 2012.  Tonight, he was in his normal state of health when he sat down around 8 PM.  At 11 PM, he tried to stand up and found that he is unable to do so.  His family also noticed facial droop and slurred speech that was new and therefore brought him into the emergency department.  LKW: 11 PM tpa given?: no, mild symptoms Premorbid modified rankin scale: 2   SUBJECTIVE He recounted his history of present illness to me. He states he had multiple episodes of near syncope when standing from lying position. She denies true vertigo. He denies any definite symptoms of stroke or TIA. CT scan of the head was unremarkable. MRI cannot be obtained due to pacemaker. Carotid ultrasound done today suggest 80-90% high grade right carotid stenosis.  OBJECTIVE Most recent Vital Signs: BP: 128/51 (04/16 1700) Pulse Rate: 63 (04/16 1700) Respiratory Rate: 12 O2 Saturdation: 95%  CBG (last 3)  Recent Labs    02/22/18 0114  GLUCAP 102*    Diet: Fall precautions Diet NPO time specified    Activity: Up with assistance   VTE Prophylaxis:  scds  Studies: Results for orders placed or performed during the hospital encounter of 02/22/18 (from the past 24 hour(s))  CBG monitoring, ED     Status: Abnormal   Collection Time: 02/22/18  1:14 AM  Result Value Ref Range   Glucose-Capillary 102 (H) 65 - 99 mg/dL  Protime-INR     Status: Abnormal   Collection Time: 02/22/18  1:15 AM  Result Value Ref Range   Prothrombin Time 15.7 (H) 11.4 - 15.2 seconds   INR  1.26   APTT     Status: None   Collection Time: 02/22/18  1:15 AM  Result Value Ref Range   aPTT 29 24 - 36 seconds  CBC     Status: Abnormal   Collection Time: 02/22/18  1:15 AM  Result Value Ref Range   WBC 4.2 4.0 - 10.5 K/uL   RBC 3.11 (L) 4.22 - 5.81 MIL/uL   Hemoglobin 9.9 (L) 13.0 - 17.0 g/dL   HCT 29.9 (L) 39.0 - 52.0 %   MCV 96.1 78.0 - 100.0 fL   MCH 31.8 26.0 - 34.0 pg   MCHC 33.1 30.0 - 36.0 g/dL   RDW 14.4 11.5 - 15.5 %   Platelets 119 (L) 150 - 400 K/uL  Differential     Status: None   Collection Time: 02/22/18  1:15 AM  Result Value Ref Range   Neutrophils Relative % 60 %   Neutro Abs 2.5 1.7 - 7.7 K/uL   Lymphocytes Relative 25 %   Lymphs Abs 1.1 0.7 - 4.0 K/uL   Monocytes Relative 10 %   Monocytes Absolute 0.4 0.1 - 1.0 K/uL   Eosinophils Relative 5 %   Eosinophils Absolute 0.2 0.0 - 0.7 K/uL   Basophils Relative 0 %   Basophils Absolute 0.0 0.0 - 0.1 K/uL  Comprehensive metabolic panel     Status: Abnormal   Collection Time: 02/22/18  1:15  AM  Result Value Ref Range   Sodium 137 135 - 145 mmol/L   Potassium 4.9 3.5 - 5.1 mmol/L   Chloride 104 101 - 111 mmol/L   CO2 26 22 - 32 mmol/L   Glucose, Bld 102 (H) 65 - 99 mg/dL   BUN 28 (H) 6 - 20 mg/dL   Creatinine, Ser 1.94 (H) 0.61 - 1.24 mg/dL   Calcium 8.4 (L) 8.9 - 10.3 mg/dL   Total Protein 6.6 6.5 - 8.1 g/dL   Albumin 3.6 3.5 - 5.0 g/dL   AST 13 (L) 15 - 41 U/L   ALT 11 (L) 17 - 63 U/L   Alkaline Phosphatase 83 38 - 126 U/L   Total Bilirubin 0.8 0.3 - 1.2 mg/dL   GFR calc non Af Amer 29 (L) >60 mL/min   GFR calc Af Amer 33 (L) >60 mL/min   Anion gap 7 5 - 15  Lipid panel     Status: None   Collection Time: 02/22/18  1:15 AM  Result Value Ref Range   Cholesterol 118 0 - 200 mg/dL   Triglycerides 56 <150 mg/dL   HDL 49 >40 mg/dL   Total CHOL/HDL Ratio 2.4 RATIO   VLDL 11 0 - 40 mg/dL   LDL Cholesterol 58 0 - 99 mg/dL  I-stat troponin, ED     Status: None   Collection Time: 02/22/18  1:19 AM   Result Value Ref Range   Troponin i, poc 0.01 0.00 - 0.08 ng/mL   Comment 3          I-Stat Chem 8, ED     Status: Abnormal   Collection Time: 02/22/18  1:21 AM  Result Value Ref Range   Sodium 139 135 - 145 mmol/L   Potassium 4.9 3.5 - 5.1 mmol/L   Chloride 104 101 - 111 mmol/L   BUN 29 (H) 6 - 20 mg/dL   Creatinine, Ser 2.00 (H) 0.61 - 1.24 mg/dL   Glucose, Bld 102 (H) 65 - 99 mg/dL   Calcium, Ion 1.09 (L) 1.15 - 1.40 mmol/L   TCO2 27 22 - 32 mmol/L   Hemoglobin 10.2 (L) 13.0 - 17.0 g/dL   HCT 30.0 (L) 39.0 - 52.0 %  Brain natriuretic peptide     Status: Abnormal   Collection Time: 02/22/18  1:43 AM  Result Value Ref Range   B Natriuretic Peptide 433.7 (H) 0.0 - 100.0 pg/mL  Creatinine, urine, random     Status: None   Collection Time: 02/22/18  1:53 AM  Result Value Ref Range   Creatinine, Urine 157.22 mg/dL  Urinalysis, Routine w reflex microscopic     Status: Abnormal   Collection Time: 02/22/18  1:56 AM  Result Value Ref Range   Color, Urine AMBER (A) YELLOW   APPearance CLEAR CLEAR   Specific Gravity, Urine 1.016 1.005 - 1.030   pH 5.0 5.0 - 8.0   Glucose, UA NEGATIVE NEGATIVE mg/dL   Hgb urine dipstick NEGATIVE NEGATIVE   Bilirubin Urine NEGATIVE NEGATIVE   Ketones, ur NEGATIVE NEGATIVE mg/dL   Protein, ur NEGATIVE NEGATIVE mg/dL   Nitrite NEGATIVE NEGATIVE   Leukocytes, UA NEGATIVE NEGATIVE  Troponin I     Status: None   Collection Time: 02/22/18 11:57 AM  Result Value Ref Range   Troponin I <0.03 <0.03 ng/mL     Dg Chest 2 View  Result Date: 02/22/2018 CLINICAL DATA:  Wheezing with shortness of breath EXAM: CHEST - 2 VIEW COMPARISON:  05/18/2017  FINDINGS: Post sternotomy changes. Left-sided pacing device as before. Trace pleural effusions. No focal consolidation. Mild cardiomegaly with aortic atherosclerosis. No pneumothorax. IMPRESSION: 1. Trace pleural effusions. 2. Mild cardiomegaly Electronically Signed   By: Donavan Foil M.D.   On: 02/22/2018 02:15    Ct Head Wo Contrast  Result Date: 02/22/2018 CLINICAL DATA:  Acute onset of generalized weakness. Abnormal gait. Left-sided facial droop. EXAM: CT HEAD WITHOUT CONTRAST TECHNIQUE: Contiguous axial images were obtained from the base of the skull through the vertex without intravenous contrast. COMPARISON:  CT of the head performed 05/09/2015 FINDINGS: Brain: No evidence of acute infarction, hemorrhage, hydrocephalus, extra-axial collection or mass lesion / mass effect. Prominence of the ventricles and sulci reflects moderate cortical volume loss. Cerebellar atrophy is noted. Scattered periventricular and subcortical white matter change likely reflects small vessel ischemic microangiopathy. The brainstem and fourth ventricle are within normal limits. The basal ganglia are unremarkable in appearance. The cerebral hemispheres demonstrate grossly normal gray-white differentiation. No mass effect or midline shift is seen. Vascular: No hyperdense vessel or unexpected calcification. Skull: There is no evidence of fracture; visualized osseous structures are unremarkable in appearance. Sinuses/Orbits: The orbits are within normal limits. There is partial opacification of the right ethmoid air cells. The paranasal sinuses and mastoid air cells are well-aerated. Other: No significant soft tissue abnormalities are seen. IMPRESSION: 1. No acute intracranial pathology seen on CT. 2. Moderate cortical volume loss and scattered small vessel ischemic microangiopathy. Electronically Signed   By: Garald Balding M.D.   On: 02/22/2018 02:10    Physical Exam:    Pleasant elderly male currently not in distress. . Afebrile. Head is nontraumatic. Neck is supple without bruit.    Cardiac exam no murmur or gallop. Lungs are clear to auscultation. Distal pulses are well felt. Neurological Exam ;  Awake  Alert oriented x 3. Normal speech and language.eye movements full without nystagmus.fundi were not visualized. Vision acuity and  fields appear normal. Hearing is normal. Palatal movements are normal. Face symmetric. Tongue midline. Normal strength, tone, reflexes and coordination. Normal sensation. Gait deferred.  ASSESSMENT Mr. ALEXANDRE FARIES is a 84 y.o. male with a  Recurrent episodes of near syncope upon standing possibly related to cerebral hypoperfusion  with high-grade right carotid stenosis on ultrasound  Rain imaging is negative an MRI cannot be obtained due to pacemaker.  He has history of atrial fibrillation but has not been on long-term anticoagulation given her remote history of GI bleed and advanced age Hospital day # 0  TREATMENT/PLAN I have personally examined this patient, reviewed notes, independently viewed imaging studies, participated in medical decision making and plan of care.ROS completed by me personally and pertinent positives fully documented  I have made any additions or clarifications directly to the above note. Agree with note above.   Continue aspirin for stroke prevention. Check transcranial Doppler studies, lipid profile and hemoglobin A1c. Patient had an echocardiogram on 01/11/89 and hence does not need another one. Vascular surgery consult to consider elective right carotid revascularization. Patient seems reluctant to consider angioplasty stenting since he states his had several cardiac stents. Long discussion with the patient's family at the bedside and answered questions about his care. Discussed with Dr. Verlon Au. Greater than 50% time during this 35 minute visit was spent on counseling and coordination of care about his episodes of near syncope, carotid stenosis and need for consideration for revascularization and answering questions.  Antony Contras, MD [facility] Pager: [phone] 02/22/2018 5:25  PM

## 2018-02-22 NOTE — ED Notes (Signed)
Neb treatment in process at this time.

## 2018-02-22 NOTE — ED Notes (Signed)
Pt to [place] with monitor.

## 2018-02-22 NOTE — Progress Notes (Signed)
Transcranial Doppler completed [address] [place], [place], [place] 4-16 2019 3:46 PM

## 2018-02-22 NOTE — Progress Notes (Signed)
Carotid duplex completed. Right 80% to 99% ICA stenosis. Vertebral arey flow is antegrade. Left - 40% to 59% ICA stenosis. Vertebral artery flow is antegrade. [facility], [place]  02/22/2018 10:17 AM

## 2018-02-22 NOTE — ED Notes (Signed)
Called lab to add on urine orders

## 2018-02-22 NOTE — Progress Notes (Signed)
PT Cancellation Note  Patient Details Name: John Peck MRN: [id number] DOB: [birth year]   Cancelled Treatment:    Reason Eval/Treat Not Completed: Medical issues which prohibited therapy RN reports pt with possible carotid endarterectomy and requesting to hold PT. Will follow up as schedule allows.   Leighton Ruff, PT, DPT  Acute Rehabilitation Services  Pager: [phone]    Rudean Hitt 02/22/2018, 11:53 AM

## 2018-02-22 NOTE — Progress Notes (Signed)
Agree with my partners history physical and assessment plan    essentially 84 year old male as per history above presenting with left facial droop difficulty speaking He has a pacemaker in situ and has had prior GI bleeds he also has had a CVA He has an extensive cardiac history last catheterization 02/14/2017 showed chronic changes but he was noncompliant on his Ranexa His PPM was changed on 12/15/2017 and he had been having chest pain since then presented to the office---He has been on aspirin Plavix without any bleeding per Dr. Evette Georges note dated that time  He is awake alert oriented he is slow to speak but he has no facial droop-he states that he comes into the hospital because he can stand up and has been short of breath for the past 6-8 months he does not recall strokes and I do not get the feeling he is a very good historian-he tells me yesterday his lower extremities "gave out"  The rest of his physical exam is non-concerning he is able to raise his legs and arms above the bedside  He just got back from vascular and will be getting a full workup with regards to stroke and will need a CT scan on 4/18   There is no family present at the bedside-I will attempt to contact them and discuss plan of care  Verneita Griffes, MD [facility] [phone]

## 2018-02-22 NOTE — ED Notes (Signed)
Dr Niu at bedside 

## 2018-02-22 NOTE — ED Notes (Addendum)
Pt's lunch tray ordered ([employer]).

## 2018-02-23 ENCOUNTER — Observation Stay (HOSPITAL_COMMUNITY): Payer: Medicare HMO

## 2018-02-23 ENCOUNTER — Encounter (HOSPITAL_COMMUNITY): Payer: Self-pay | Admitting: General Practice

## 2018-02-23 DIAGNOSIS — E785 Hyperlipidemia, unspecified: Secondary | ICD-10-CM

## 2018-02-23 DIAGNOSIS — J441 Chronic obstructive pulmonary disease with (acute) exacerbation: Secondary | ICD-10-CM | POA: Diagnosis not present

## 2018-02-23 DIAGNOSIS — N179 Acute kidney failure, unspecified: Secondary | ICD-10-CM | POA: Diagnosis not present

## 2018-02-23 DIAGNOSIS — I6521 Occlusion and stenosis of right carotid artery: Secondary | ICD-10-CM | POA: Diagnosis not present

## 2018-02-23 DIAGNOSIS — Z862 Personal history of diseases of the blood and blood-forming organs and certain disorders involving the immune mechanism: Secondary | ICD-10-CM | POA: Diagnosis not present

## 2018-02-23 DIAGNOSIS — D696 Thrombocytopenia, unspecified: Secondary | ICD-10-CM | POA: Diagnosis not present

## 2018-02-23 DIAGNOSIS — N183 Chronic kidney disease, stage 3 (moderate): Secondary | ICD-10-CM

## 2018-02-23 DIAGNOSIS — I5032 Chronic diastolic (congestive) heart failure: Secondary | ICD-10-CM | POA: Diagnosis not present

## 2018-02-23 DIAGNOSIS — I6529 Occlusion and stenosis of unspecified carotid artery: Secondary | ICD-10-CM | POA: Diagnosis not present

## 2018-02-23 DIAGNOSIS — R51 Headache: Secondary | ICD-10-CM | POA: Diagnosis not present

## 2018-02-23 DIAGNOSIS — Z951 Presence of aortocoronary bypass graft: Secondary | ICD-10-CM | POA: Diagnosis not present

## 2018-02-23 DIAGNOSIS — I480 Paroxysmal atrial fibrillation: Secondary | ICD-10-CM | POA: Diagnosis not present

## 2018-02-23 DIAGNOSIS — R29810 Facial weakness: Secondary | ICD-10-CM

## 2018-02-23 LAB — CBC WITH DIFFERENTIAL/PLATELET
Basophils Absolute: 0 10*3/uL (ref 0.0–0.1)
Basophils Relative: 0 %
Eosinophils Absolute: 0 10*3/uL (ref 0.0–0.7)
Eosinophils Relative: 0 %
HEMATOCRIT: 30.5 % — AB (ref 39.0–52.0)
HEMOGLOBIN: 10.3 g/dL — AB (ref 13.0–17.0)
LYMPHS PCT: 6 %
Lymphs Abs: 0.4 10*3/uL — ABNORMAL LOW (ref 0.7–4.0)
MCH: 32.1 pg (ref 26.0–34.0)
MCHC: 33.8 g/dL (ref 30.0–36.0)
MCV: 95 fL (ref 78.0–100.0)
MONOS PCT: 3 %
Monocytes Absolute: 0.2 10*3/uL (ref 0.1–1.0)
NEUTROS ABS: 6.9 10*3/uL (ref 1.7–7.7)
NEUTROS PCT: 91 %
Platelets: 120 10*3/uL — ABNORMAL LOW (ref 150–400)
RBC: 3.21 MIL/uL — ABNORMAL LOW (ref 4.22–5.81)
RDW: 14.1 % (ref 11.5–15.5)
WBC: 7.5 10*3/uL (ref 4.0–10.5)

## 2018-02-23 LAB — RENAL FUNCTION PANEL
ANION GAP: 9 (ref 5–15)
Albumin: 3.4 g/dL — ABNORMAL LOW (ref 3.5–5.0)
BUN: 28 mg/dL — ABNORMAL HIGH (ref 6–20)
CHLORIDE: 105 mmol/L (ref 101–111)
CO2: 23 mmol/L (ref 22–32)
Calcium: 8.6 mg/dL — ABNORMAL LOW (ref 8.9–10.3)
Creatinine, Ser: 1.46 mg/dL — ABNORMAL HIGH (ref 0.61–1.24)
GFR calc non Af Amer: 40 mL/min — ABNORMAL LOW (ref >60)
GFR, EST AFRICAN AMERICAN: 47 mL/min — AB (ref >60)
GLUCOSE: 144 mg/dL — AB (ref 65–99)
Phosphorus: 3 mg/dL (ref 2.5–4.6)
Potassium: 3.8 mmol/L (ref 3.5–5.1)
Sodium: 137 mmol/L (ref 135–145)

## 2018-02-23 LAB — HEMOGLOBIN A1C
Hgb A1c MFr Bld: 5.5 % (ref 4.8–5.6)
MEAN PLASMA GLUCOSE: 111.15 mg/dL

## 2018-02-23 LAB — UREA NITROGEN, URINE: Urea Nitrogen, Ur: 718 mg/dL

## 2018-02-23 MED ORDER — IOPAMIDOL (ISOVUE-370) INJECTION 76%
INTRAVENOUS | Status: AC
  Filled 2018-02-23: qty 50

## 2018-02-23 MED ORDER — IPRATROPIUM-ALBUTEROL 0.5-2.5 (3) MG/3ML IN SOLN
3.0000 mL | Freq: Two times a day (BID) | RESPIRATORY_TRACT | Status: DC
  Administered 2018-02-23 – 2018-02-24 (×2): 3 mL via RESPIRATORY_TRACT
  Filled 2018-02-23 (×2): qty 3

## 2018-02-23 MED ORDER — METHYLPREDNISOLONE SODIUM SUCC 125 MG IJ SOLR
60.0000 mg | Freq: Every day | INTRAMUSCULAR | Status: DC
  Administered 2018-02-24: 60 mg via INTRAVENOUS

## 2018-02-23 MED ORDER — FUROSEMIDE 10 MG/ML IJ SOLN
20.0000 mg | Freq: Once | INTRAMUSCULAR | Status: AC
  Administered 2018-02-23: 20 mg via INTRAVENOUS
  Filled 2018-02-23: qty 4

## 2018-02-23 NOTE — Evaluation (Signed)
Occupational Therapy Evaluation Patient Details Name: John Peck MRN: [id number] DOB: [birth year] Today's Date: 02/23/2018    History of Present Illness 84 y.o. male with medical history significant of PAF not on AC due to GIB, hypertension, hyperlipidemia, COPD, GERD, hypothyroidism, sick sinus syndrome, pacemaker placement, left bundle blockage, CKD-3, CHF with EF of 40%, CAD, stent placement, CABG, and deficiency anemia, who presents with left facial droop, difficulty speaking, abnormal gait and leg weakness   Clinical Impression   Pt admitted with above and presents to OT with decreased dynamic standing balance and cardiopulmonary impacting activity tolerance however able to complete all functional mobility with supervision and reports self-care tasks at baseline.  Pt's wife would assist with setup for bathing and assist with LB dressing (mostly with donning socks and shoes).  Pt reports that he is feeling better and asking about when he can go home.  Pt at baseline for ADL tasks, therefore not recommending any OT follow up at this time as pt has support at home.    Follow Up Recommendations  No OT follow up;Supervision/Assistance - 24 hour    Equipment Recommendations  None recommended by OT    Recommendations for Other Services       Precautions / Restrictions Precautions Precautions: Fall      Mobility Bed Mobility Overal bed mobility: Needs Assistance Bed Mobility: Supine to Sit     Supine to sit: Supervision     General bed mobility comments: No physical assist required however increased time and significant effort required to come to EOB with HOB elevated. Patient with pause in posterior lean before scooting to edge  Transfers Overall transfer level: Needs assistance Equipment used: Rolling walker (2 wheeled) Transfers: Sit to/from Stand Sit to Stand: Supervision         General transfer comment: no physical assist required, flexed posture noted.  Improved  postural control and upright standing with RW.    Balance Overall balance assessment: Mild deficits observed, not formally tested                                         ADL either performed or assessed with clinical judgement   ADL Overall ADL's : Needs assistance/impaired     Grooming: Standing;Supervision/safety   Upper Body Bathing: Supervision/ safety;Set up   Lower Body Bathing: Minimal assistance;Sit to/from stand Lower Body Bathing Details (indicate cue type and reason): requires assist to wash feet Upper Body Dressing : Supervision/safety;Set up;Sitting   Lower Body Dressing: Moderate assistance Lower Body Dressing Details (indicate cue type and reason): Pt requires assist to don hospital socks, he reports PLOF that wife assists with donning socks and shoes Toilet Transfer: Supervision/safety;Grab bars;Ambulation;RW   Toileting- Clothing Manipulation and Hygiene: Supervision/safety;Sit to/from stand       Functional mobility during ADLs: Supervision/safety;Rolling walker General ADL Comments: Pt requires assistance from wife with bathing and dressing - setup for bathing and assist to don socks and shoes.     Vision Baseline Vision/History: Wears glasses Wears Glasses: At all times Patient Visual Report: No change from baseline Vision Assessment?: No apparent visual deficits            Pertinent Vitals/Pain Pain Assessment: No/denies pain     Hand Dominance Right   Extremity/Trunk Assessment Upper Extremity Assessment Upper Extremity Assessment: Overall WFL for tasks assessed(strength grossly 5/5)   Lower Extremity Assessment Lower Extremity Assessment:  RLE deficits/detail;LLE deficits/detail;Generalized weakness RLE Coordination: decreased gross motor;decreased fine motor LLE Coordination: decreased fine motor;decreased gross motor       Communication Communication Communication: No difficulties   Cognition Arousal/Alertness:  Awake/alert Behavior During Therapy: WFL for tasks assessed/performed Overall Cognitive Status: No family/caregiver present to determine baseline cognitive functioning Area of Impairment: Attention;Awareness;Problem solving                   Current Attention Level: Selective       Awareness: Anticipatory Problem Solving: Slow processing;Requires verbal cues General Comments: No family present at bedside to confirm PLOF/cognition, however pt able to follow directions with mild increased time              Home Living Family/patient expects to be discharged to:: Private residence Living Arrangements: Spouse/significant other;Children Available Help at Discharge: Family;Available 24 hours/day Type of Home: House Home Access: Stairs to enter [employer] of Steps: 1   Home Layout: Two level;Able to live on main level with bedroom/bathroom Alternate Level Stairs-Number of Steps: 6 Alternate Level Stairs-Rails: Right Bathroom Shower/Tub: Tub only(only sponge bathes)   Bathroom Toilet: Standard Bathroom Accessibility: Yes   Home Equipment: Walker - 4 wheels;Bedside commode;Cane - single point   Additional Comments: patient reports that he sponge bathes  Lives With: Spouse    Prior Functioning/Environment Level of Independence: Needs assistance  Gait / Transfers Assistance Needed: uses a 4 wheeled walker for mobility ADL's / Homemaking Assistance Needed: wife assists him with dressing and bathing (sponge bathes).  Pt reports he can bathe with setup at sit > stand level at sink, requires assist for donning socks/shoes            OT Problem List: Decreased activity tolerance;Impaired balance (sitting and/or standing);Cardiopulmonary status limiting activity         OT Goals(Current goals can be found in the care plan section) Acute Rehab OT Goals Patient Stated Goal: to go home OT Goal Formulation: All assessment and education complete, DC therapy    AM-PAC PT "6 Clicks" Daily Activity     Outcome Measure Help from another person eating meals?: None Help from another person taking care of personal grooming?: A Little Help from another person toileting, which includes using toliet, bedpan, or urinal?: A Little Help from another person bathing (including washing, rinsing, drying)?: A Little Help from another person to put on and taking off regular upper body clothing?: A Little Help from another person to put on and taking off regular lower body clothing?: A Lot 6 Click Score: 18   End of Session Equipment Utilized During Treatment: Gait belt;Rolling walker  Activity Tolerance: Patient tolerated treatment well Patient left: in chair;with call bell/phone within reach;with chair alarm set  OT Visit Diagnosis: Unsteadiness on feet (R26.81)                Time: 8921-1941 OT Time Calculation (min): 16 min Charges:  OT General Charges $OT Visit: 1 Visit OT Evaluation $OT Eval Moderate Complexity: 1 Mod   [place], [place], [place] 02/23/2018, 10:52 AM

## 2018-02-23 NOTE — Evaluation (Signed)
Speech Language Pathology Evaluation Patient Details Name: John Peck MRN: [id number] DOB: [birth year] Today's Date: 02/23/2018 Time: 6301-6010 SLP Time Calculation (min) (ACUTE ONLY): 7 min  Problem List:  Patient Active Problem List   Diagnosis Date Noted  . Facial droop 02/22/2018  . COPD with acute exacerbation ([place]) 02/22/2018  . Thrombocytopenia ([place]) 02/22/2018  . Generalized weakness   . CAD (coronary artery disease)   . Hypertensive heart disease   . Acute renal failure superimposed on stage 3 chronic kidney disease ([place])   . Hyperlipidemia   . Stented coronary artery   . Coronary artery disease involving native coronary artery of native heart with unstable angina pectoris ([place])   . Chest pain 11/27/2015  . Reactive airway disease 11/27/2015  . Angina pectoris ([place]) 11/27/2015  . CAD in native artery 09/03/2015  . Hyperkalemia 05/09/2015  . Weakness 05/09/2015  . Acute on chronic kidney failure ([place]) 05/09/2015  . Chronic diastolic CHF (congestive heart failure) ([place]) 05/09/2015  . Hyperlipidemia LDL goal <70 02/28/2015  . Atherosclerosis of lower extremity with claudication ([place]) 07/17/2014  . Unstable angina ([place]) 01/23/2014  . PAF (paroxysmal atrial fibrillation) ([place]) 03/24/2013  . Lower extremity edema 03/24/2013  . Sinoatrial node dysfunction ([place]) 08/17/2012  . History of iron deficiency anemia 09/18/2011  . Essential hypertension 07/07/2009  . INTERMEDIATE CORONARY SYNDROME 07/07/2009  . S/P CABG x 2 '89. RCA stent'04. last cath Jan 2013 07/07/2009  . PACEMAKER, PERMANENT- St Jude Sept 2009 07/07/2009  . Sick sinus syndrome ([place]) 07/10/2008   Past Medical History:  Past Medical History:  Diagnosis Date  . Anemia   . Arthritis   . CAD (coronary artery disease)    a. CABG '89; b. PCI '04; c. 11/2015 Cath/PCI: LM 20ost, LAD 156m, LCX 30p, OM2 40, RCA 30p, 10p ISR, 90d (3.0x12 Synergy DES), AM 90, VG->OM2 known to be 100, LIMA->LAD not injected, patent  in 2015.  Marland Kitchen Chronic combined systolic and diastolic CHF (congestive heart failure) ([place])   . CKD (chronic kidney disease), stage III ([place])   . H/O: GI bleed    REMOTE HISTORY  . History of COPD   . Hyperlipidemia   . Hypertensive heart disease   . LBBB (left bundle branch block)   . Pacemaker Sept 2009   St Jude  . PAF (paroxysmal atrial fibrillation) (H[facility])    a. not on anticoag due to prior GIB.  . Sick sinus syndrome [facility]) Sept 2009   ST Jude PTVDP   Past Surgical History:  Past Surgical History:  Procedure Laterality Date  . CARDIAC CATHETERIZATION  11/2011   EF 50%; significant native CAD w/80% stenosis of the LAD after 2nd giagonal vessel and septal perforating artery w/total occlusion of the mid left anterior descendiung.; 60-70% ostiasl stenosis in the circumflex vessel followed by 40% proximal stenosis and 70-80% distal circumflex stenosis;   . CARDIAC CATHETERIZATION N/A 11/29/2015   Procedure: Left Heart Cath and Cors/Grafts Angiography;  Surgeon: Burnell Blanks, MD;  Location: [place] CV LAB;  Service: Cardiovascular;  Laterality: N/A;  . CARDIAC CATHETERIZATION N/A 11/29/2015   Procedure: Coronary Stent Intervention;  Surgeon: Burnell Blanks, MD;  Location: [place] CV LAB;  Service: Cardiovascular;  Laterality: N/A;  . CATARACT EXTRACTION  2004  . CORONARY ANGIOGRAPHY N/A 02/25/2017   Procedure: Coronary Angiography;  Surgeon: Belva Crome, MD;  Location: [place] CV LAB;  Service: Cardiovascular;  Laterality: N/A;  . CORONARY ANGIOPLASTY WITH STENT PLACEMENT  2004  RCA  . CORONARY ARTERY BYPASS GRAFT  1989   had LIMA to his LAD, a vein to the circumflex. In 2004 underwent stenting to his right coronary artery.  . CORONARY STENT INTERVENTION N/A 02/24/2017   Procedure: Coronary Stent Intervention;  Surgeon: Wellington Hampshire, MD;  Location: [place] CV LAB;  Service: Cardiovascular;  Laterality: N/A;  cfx  . HEMORRHOID SURGERY    . INSERT /  REPLACE / REMOVE PACEMAKER  07/17/08   DUAL-CHAMBER; PPM-ST.JUDE MEDNET  . LEFT HEART CATH AND CORS/GRAFTS ANGIOGRAPHY N/A 02/24/2017   Procedure: Left Heart Cath and Cors/Grafts Angiography;  Surgeon: Wellington Hampshire, MD;  Location: [place] CV LAB;  Service: Cardiovascular;  Laterality: N/A;  . LEFT HEART CATHETERIZATION WITH CORONARY/GRAFT ANGIOGRAM N/A 12/10/2011   Procedure: LEFT HEART CATHETERIZATION WITH Beatrix Fetters;  Surgeon: Troy Sine, MD;  Location: [facility] CATH LAB;  Service: Cardiovascular;  Laterality: N/A;  . LEFT HEART CATHETERIZATION WITH CORONARY/GRAFT ANGIOGRAM N/A 01/26/2014   Procedure: LEFT HEART CATHETERIZATION WITH Beatrix Fetters;  Surgeon: Troy Sine, MD;  Location: [facility] CATH LAB;  Service: Cardiovascular;  Laterality: N/A;  . PPM GENERATOR CHANGEOUT N/A 12/15/2017   Procedure: PPM GENERATOR CHANGEOUT;  Surgeon: Evans Lance, MD;  Location: [place] CV LAB;  Service: Cardiovascular;  Laterality: N/A;  . PROSTATECTOMY     HPI:  84 y.o. male with medical history significant of PAF not on AC due to GIB, hypertension, hyperlipidemia, COPD, GERD, hypothyroidism, sick sinus syndrome, pacemaker placement, left bundle blockage, CKD-3, CHF with EF of 40%, CAD, stent placement, CABG, and deficiency anemia, who presents with left facial droop, difficulty speaking, abnormal gait and leg weakness. CT no acute intracranial pathology, moderate cortical volume loss and scattered small vessel ischemic microangiopathy.   Assessment / Plan / Recommendation Clinical Impression  Pt seen for speech-language-cognitive assessment. He reported initial slurred speech which has since cleared. He lives with his wife who handles appointments and medication management (pt report, no family present). He is oriented x 4 and recalled reason for admission and outcome. Answered basic verbal problem solving with accuracy, speech intelligible. Needed prompts to name items in category.  Overall ST is not recommended at this time and appears he is functional at home with support.       SLP Assessment  SLP Recommendation/Assessment: Patient does not need any further Speech Lanaguage Pathology Services SLP Visit Diagnosis: Cognitive communication deficit (R41.841)    Follow Up Recommendations  None    Frequency and Duration           SLP Evaluation Cognition  Overall Cognitive Status: No family/caregiver present to determine baseline cognitive functioning Arousal/Alertness: Awake/alert Orientation Level: Oriented to person;Oriented to place;Oriented to time;Oriented to situation Attention: Sustained Sustained Attention: Appears intact Memory: (functionally recalled info from MD ) Awareness: Appears intact Problem Solving: Appears intact Safety/Judgment: Other (comment)(appears intact for verbal, not certain during activity)       Comprehension  Auditory Comprehension Overall Auditory Comprehension: Appears within functional limits for tasks assessed Visual Recognition/Discrimination Discrimination: Not tested Reading Comprehension Reading Status: Not tested    Expression Expression Primary Mode of Expression: Verbal Verbal Expression Overall Verbal Expression: Appears within functional limits for tasks assessed Initiation: No impairment Level of Generative/Spontaneous Verbalization: Conversation Repetition: (NT- do not suspect deficits) Naming: Not tested Pragmatics: No impairment Written Expression Dominant Hand: Right Written Expression: Not tested   Oral / Motor  Oral Motor/Sensory Function Overall Oral Motor/Sensory Function: Within functional limits Motor Speech Overall  Motor Speech: Appears within functional limits for tasks assessed Respiration: Within functional limits Phonation: Normal Resonance: Within functional limits Articulation: Within functional limitis Intelligibility: Intelligible Motor Planning: Witnin functional limits    GO                    Houston Siren 02/23/2018, 10:24 AM

## 2018-02-23 NOTE — Progress Notes (Signed)
PROGRESS NOTE    John Peck  MVE:[id number] DOB: Oct 02, 1924 DOA: 02/22/2018 PCP: Seward Carol, MD   Brief Narrative:  HPI on 4/16/219 by Dr. Ivor Costa John Peck is a 84 y.o. male with medical history significant of PAF not on AC due to GIB, hypertension, hyperlipidemia, COPD, GERD, hypothyroidism, sick sinus syndrome, pacemaker placement, left bundle blockage, CKD-3, CHF with EF of 40%, CAD, stent placement, CABG, and deficiency anemia, who presents with left facial droop, difficulty speaking, abnormal gait and leg weakness.  Per EDP, family reported that pt was found to have new and left facial droop, abnormal gait and difficulty speaking at about 11 PM. LKN was 8:00 PM. Pt he states that he has a weakness in both legs. No vision change or hearing loss. He denies numbness or tingling seen in extremities. He states that he has a shortness breath and dry cough. No chest pain, fever or chills. He states that He sometimes has mild dysuria and discomfort on urination. Denies nausea, vomiting, diarrhea, abdominal pain.  Interim history Admitted for left facial droop and difficulty speaking.  MRI could not be obtained due to patient's pacemaker.  Neurology consulted and appreciated. Assessment & Plan   Facial droop with difficulty speaking -CT head showed no acute intracranial pathology -MRI could not be done due to pacemaker placement -Recent echocardiogram March 2019 showed an EF of 28-36%, grade 2 diastolic dysfunction -Carotid Doppler showed right ICA stenosis 80-99%, left ICA stenosis of 40-59%.  Bilateral vertebral arteries demonstrate antegrade flow. -Transcranial Doppler results pending -Hemoglobin A1c 5.5, LDL 58 -Neurology consulted and appreciated- feels this episode could be due to cerebral hypoperfusion with high-grade right carotid stenosis noted on ultrasound. -Vascular surgery consulted and appreciated. Discussed with Dr. Trula Slade, plan to see patient later today.  Timing of surgery pending.  -Continue statin, Plavix, aspirin -PT consulted and recommended HH -OT evaluted patient, no further needs  Mild COPD exacerbation -Patient had wheezing noted on auscultation during admission -Chest x-ray reviewed and unremarkable for infection -Placed on nebulizer treatments as needed as well as Solu-Medrol and Mucinex -will wean solumedrol  CAD -Currently no chest pain -Status post CABG x2 in 1989 -Patient had a catheterization 2013 -Continue aspirin, Zetia, statin, imdur, ranexa  Iron deficiency anemia -Hemoglobin currently stable, continue to monitor CBC -Continue iron supplementation  Chronic systolic/diastolic heart failure -Chest x-ray unremarkable for edema -Lasix was held due to worsening renal function -Monitor intake and output, daily weights -Last echocardiogram 01/24/2018: EF of 62-94%, grade 2 diastolic dysfunction  Paroxysmal atrial fibrillation -Currently not on anticoagulation due to history of GI bleed -Continue Coreg  Acute kidney injury on chronic kidney disease, stage III -Baseline creatinine 1.3-1.5, on admission was 2 -Improved, creatinine 1.46  Hyperlipidemia -Continue statin, Lipitor  Chronic thrombocytopenia -Platelets stable, continue to monitor CBC  Hypothyroidism -Continue Synthroid  DVT Prophylaxis  Lovenox  Code Status: Full  Family Communication: None at bedside  Disposition Plan: Observation, pending vascular consult and further neurology recommendations  Consultants Neurology Vascular surgery  Procedures  Carotid ultrasound Transcranial Doppler  Antibiotics   Anti-infectives (From admission, onward)   None      Subjective:   John Peck seen and examined today.  Denies dizziness, chest pain, shortness of breath, abdominal pain, N/V/D/C.  Objective:   Vitals:   02/23/18 0001 02/23/18 0420 02/23/18 0739 02/23/18 0758  BP: (!) 138/53 135/68 117/75   Pulse: 61 62 99   Resp: 18 20 20     Temp: 98.3 F (  36.8 C) 98.7 F (37.1 C) 98.7 F (37.1 C)   TempSrc: Oral Oral Axillary   SpO2: 97% 98% 90% 94%  Weight:      Height:        Intake/Output Summary (Last 24 hours) at 02/23/2018 1358 Last data filed at 02/23/2018 0421 Gross per 24 hour  Intake -  Output 625 ml  Net -625 ml   Filed Weights   02/22/18 0047 02/22/18 0058  Weight: 74.8 kg (165 lb) 74.8 kg (165 lb)    Exam  General: Well developed, well nourished, NAD, appears stated age  HEENT: NCAT, mucous membranes moist.   Neck: Supple   Cardiovascular: S1 S2 auscultated, no rubs, Regular rate and rhythm.  Respiratory: Clear to auscultation bilaterally with equal chest rise  Abdomen: Soft, nontender, nondistended, + bowel sounds  Extremities: warm dry without cyanosis clubbing or edema  Neuro: AAOx3, nonfocal  Psych: Appropriate mood and affect   Data Reviewed: I have personally reviewed following labs and imaging studies  CBC: Recent Labs  Lab 02/22/18 0115 02/22/18 0121 02/23/18 0505  WBC 4.2  --  7.5  NEUTROABS 2.5  --  6.9  HGB 9.9* 10.2* 10.3*  HCT 29.9* 30.0* 30.5*  MCV 96.1  --  95.0  PLT 119*  --  209*   Basic Metabolic Panel: Recent Labs  Lab 02/22/18 0115 02/22/18 0121 02/23/18 0505  NA 137 139 137  K 4.9 4.9 3.8  CL 104 104 105  CO2 26  --  23  GLUCOSE 102* 102* 144*  BUN 28* 29* 28*  CREATININE 1.94* 2.00* 1.46*  CALCIUM 8.4*  --  8.6*  PHOS  --   --  3.0   GFR: Estimated Creatinine Clearance: 33 mL/min (A) (by C-G formula based on SCr of 1.46 mg/dL (H)). Liver Function Tests: Recent Labs  Lab 02/22/18 0115 02/23/18 0505  AST 13*  --   ALT 11*  --   ALKPHOS 83  --   BILITOT 0.8  --   PROT 6.6  --   ALBUMIN 3.6 3.4*   No results for input(s): LIPASE, AMYLASE in the last 168 hours. No results for input(s): AMMONIA in the last 168 hours. Coagulation Profile: Recent Labs  Lab 02/22/18 0115  INR 1.26   Cardiac Enzymes: Recent Labs  Lab 02/22/18 1157   TROPONINI <0.03   BNP (last 3 results) No results for input(s): PROBNP in the last 8760 hours. HbA1C: Recent Labs    02/23/18 0505  HGBA1C 5.5   CBG: Recent Labs  Lab 02/22/18 0114  GLUCAP 102*   Lipid Profile: Recent Labs    02/22/18 0115  CHOL 118  HDL 49  LDLCALC 58  TRIG 56  CHOLHDL 2.4   Thyroid Function Tests: No results for input(s): TSH, T4TOTAL, FREET4, T3FREE, THYROIDAB in the last 72 hours. Anemia Panel: No results for input(s): VITAMINB12, FOLATE, FERRITIN, TIBC, IRON, RETICCTPCT in the last 72 hours. Urine analysis:    Component Value Date/Time   COLORURINE AMBER (A) 02/22/2018 0156   APPEARANCEUR CLEAR 02/22/2018 0156   LABSPEC 1.016 02/22/2018 0156   PHURINE 5.0 02/22/2018 0156   GLUCOSEU NEGATIVE 02/22/2018 0156   HGBUR NEGATIVE 02/22/2018 0156   BILIRUBINUR NEGATIVE 02/22/2018 0156   KETONESUR NEGATIVE 02/22/2018 0156   PROTEINUR NEGATIVE 02/22/2018 0156   UROBILINOGEN 0.2 05/09/2015 1523   NITRITE NEGATIVE 02/22/2018 0156   LEUKOCYTESUR NEGATIVE 02/22/2018 0156   Sepsis Labs: @LABRCNTIP (procalcitonin:4,lacticidven:4)  )No results found for this or any previous visit (  from the past 240 hour(s)).    Radiology Studies: Dg Chest 2 View  Result Date: 02/22/2018 CLINICAL DATA:  Wheezing with shortness of breath EXAM: CHEST - 2 VIEW COMPARISON:  05/18/2017 FINDINGS: Post sternotomy changes. Left-sided pacing device as before. Trace pleural effusions. No focal consolidation. Mild cardiomegaly with aortic atherosclerosis. No pneumothorax. IMPRESSION: 1. Trace pleural effusions. 2. Mild cardiomegaly Electronically Signed   By: Donavan Foil M.D.   On: 02/22/2018 02:15   Ct Head Wo Contrast  Result Date: 02/22/2018 CLINICAL DATA:  Acute onset of generalized weakness. Abnormal gait. Left-sided facial droop. EXAM: CT HEAD WITHOUT CONTRAST TECHNIQUE: Contiguous axial images were obtained from the base of the skull through the vertex without intravenous  contrast. COMPARISON:  CT of the head performed 05/09/2015 FINDINGS: Brain: No evidence of acute infarction, hemorrhage, hydrocephalus, extra-axial collection or mass lesion / mass effect. Prominence of the ventricles and sulci reflects moderate cortical volume loss. Cerebellar atrophy is noted. Scattered periventricular and subcortical white matter change likely reflects small vessel ischemic microangiopathy. The brainstem and fourth ventricle are within normal limits. The basal ganglia are unremarkable in appearance. The cerebral hemispheres demonstrate grossly normal gray-white differentiation. No mass effect or midline shift is seen. Vascular: No hyperdense vessel or unexpected calcification. Skull: There is no evidence of fracture; visualized osseous structures are unremarkable in appearance. Sinuses/Orbits: The orbits are within normal limits. There is partial opacification of the right ethmoid air cells. The paranasal sinuses and mastoid air cells are well-aerated. Other: No significant soft tissue abnormalities are seen. IMPRESSION: 1. No acute intracranial pathology seen on CT. 2. Moderate cortical volume loss and scattered small vessel ischemic microangiopathy. Electronically Signed   By: Garald Balding M.D.   On: 02/22/2018 02:10     Scheduled Meds: . aspirin EC  81 mg Oral Daily  . atorvastatin  40 mg Oral Daily  . clopidogrel  75 mg Oral Q1500  . enoxaparin (LOVENOX) injection  30 mg Subcutaneous Daily  . ezetimibe  10 mg Oral Daily  . ferrous sulfate  325 mg Oral BID WC  . ipratropium-albuterol  3 mL Nebulization BID  . isosorbide mononitrate  30 mg Oral QPM  . isosorbide mononitrate  90 mg Oral q morning - 10a  . [START ON 02/24/2018] latanoprost  1 drop Both Eyes Once per day on Mon Thu  . levothyroxine  125 mcg Oral QAC breakfast  . methylPREDNISolone (SOLU-MEDROL) injection  60 mg Intravenous Q12H  . pantoprazole  40 mg Oral BID  . ranolazine  1,000 mg Oral BID  . tamsulosin  0.4  mg Oral Daily   Continuous Infusions:   LOS: 0 days 30  Time Spent in minutes   45 minutes (greater than 50% of time was spent examining patient, reviewing chart, and coordinating care/discussing with consultants)  Cristal Ford D.O. on 02/23/2018 at 1:58 PM  Between 7am to 7pm - Pager - [phone]  After 7pm go to [email or web address] - password [email or web address]  And look for the night coverage person covering for me after hours  [facility] Office  [phone]

## 2018-02-23 NOTE — Evaluation (Signed)
Physical Therapy Evaluation Patient Details Name: John Peck MRN: [id number] DOB: [birth year] Today's Date: 02/23/2018   History of Present Illness  84 y.o. male with medical history significant of PAF not on AC due to GIB, hypertension, hyperlipidemia, COPD, GERD, hypothyroidism, sick sinus syndrome, pacemaker placement, left bundle blockage, CKD-3, CHF with EF of 40%, CAD, stent placement, CABG, and deficiency anemia, who presents with left facial droop, difficulty speaking, abnormal gait and leg weakness  Clinical Impression  Orders received for PT evaluation. Patient demonstrates deficits in functional mobility as indicated below. Will benefit from continued skilled PT to address deficits and maximize function. Will see as indicated and progress as tolerated.  OF NOTE: patient with some audible wheezing during session saturations stable 95% and HR upper 80s    Follow Up Recommendations Home health PT;Supervision/Assistance - 24 hour(if family unable to provide assist may need to SNF)    Equipment Recommendations  None recommended by PT    Recommendations for Other Services       Precautions / Restrictions Precautions Precautions: Fall      Mobility  Bed Mobility Overal bed mobility: Needs Assistance Bed Mobility: Supine to Sit     Supine to sit: Supervision     General bed mobility comments: No physical assist required however increased time and significant effort required to come to EOB with HOB elevated. Patient with pause in posterior lean before scooting to edge  Transfers Overall transfer level: Needs assistance Equipment used: Rolling walker (2 wheeled) Transfers: Sit to/from Stand Sit to Stand: Min guard         General transfer comment: no physical assist required, flexed posture noted.  Ambulation/Gait Ambulation/Gait assistance: Min guard Ambulation Distance (Feet): 90 Feet Assistive device: Rolling walker (2 wheeled) Gait Pattern/deviations:  Step-through pattern;Decreased stride length;Trunk flexed;Drifts right/left;Shuffle Gait velocity: b   General Gait Details: patient with noted instability and drifiting outside of RW. No physical assist provided during ambulation. Patient also able to ambulate in room with out assistive device. decreased  Stairs            Wheelchair Mobility    Modified Rankin (Stroke Patients Only) Modified Rankin (Stroke Patients Only) Pre-Morbid Rankin Score: Moderate disability Modified Rankin: Moderate disability     Balance Overall balance assessment: Mild deficits observed, not formally tested                                           Pertinent Vitals/Pain Pain Assessment: No/denies pain    Home Living Family/patient expects to be discharged to:: Private residence Living Arrangements: Spouse/significant other;Children Available Help at Discharge: Family;Available 24 hours/day Type of Home: House Home Access: Stairs to enter   [employer] of Steps: 1 Home Layout: Two level;Able to live on main level with bedroom/bathroom Home Equipment: Walker - 4 wheels;Bedside commode;Cane - single point Additional Comments: patient reports that he sponge bathes    Prior Function Level of Independence: Needs assistance   Gait / Transfers Assistance Needed: uses a 4 wheeled walker for mobility  ADL's / Homemaking Assistance Needed: wife assists him with dressing and bathing (sponge bathes)        Hand Dominance   Dominant Hand: Right    Extremity/Trunk Assessment   Upper Extremity Assessment Upper Extremity Assessment: Defer to OT evaluation    Lower Extremity Assessment Lower Extremity Assessment: RLE deficits/detail;LLE deficits/detail;Generalized weakness RLE Coordination: decreased  gross motor;decreased fine motor LLE Coordination: decreased fine motor;decreased gross motor       Communication   Communication: Expressive difficulties   Cognition Arousal/Alertness: Awake/alert Behavior During Therapy: WFL for tasks assessed/performed Overall Cognitive Status: No family/caregiver present to determine baseline cognitive functioning Area of Impairment: Attention;Awareness;Problem solving                   Current Attention Level: Selective       Awareness: Anticipatory Problem Solving: Slow processing;Requires verbal cues General Comments: No family present at bedside to confirm PLOF/cognition      General Comments      Exercises     Assessment/Plan    PT Assessment Patient needs continued PT services  PT Problem List Decreased strength;Decreased activity tolerance;Decreased balance;Decreased mobility;Cardiopulmonary status limiting activity       PT Treatment Interventions DME instruction;Gait training;Functional mobility training;Therapeutic activities;Therapeutic exercise;Patient/family education    PT Goals (Current goals can be found in the Care Plan section)  Acute Rehab PT Goals Patient Stated Goal: to go home PT Goal Formulation: With patient Time For Goal Achievement: 03/09/18 Potential to Achieve Goals: Good    Frequency Min 3X/week   Barriers to discharge        Co-evaluation               AM-PAC PT "6 Clicks" Daily Activity  Outcome Measure Difficulty turning over in bed (including adjusting bedclothes, sheets and blankets)?: A Lot Difficulty moving from lying on back to sitting on the side of the bed? : A Lot Difficulty sitting down on and standing up from a chair with arms (e.g., wheelchair, bedside commode, etc,.)?: A Lot Help needed moving to and from a bed to chair (including a wheelchair)?: A Little Help needed walking in hospital room?: A Little Help needed climbing 3-5 steps with a railing? : A Lot 6 Click Score: 14    End of Session Equipment Utilized During Treatment: Gait belt Activity Tolerance: Patient limited by fatigue(noted SOB) Patient left: in  chair;with call bell/phone within reach;with chair alarm set Nurse Communication: Mobility status PT Visit Diagnosis: Unsteadiness on feet (R26.81);Muscle weakness (generalized) (M62.81);Difficulty in walking, not elsewhere classified (R26.2)    Time: 4008-6761 PT Time Calculation (min) (ACUTE ONLY): 21 min   Charges:   PT Evaluation $PT Eval Moderate Complexity: 1 Mod     PT G Codes:        Alben Deeds, PT DPT  Board Certified Neurologic Specialist [place] 02/23/2018, 9:11 AM

## 2018-02-23 NOTE — Consult Note (Addendum)
Hospital Consult    Reason for Consult:  Symptomatic carotid stenosis Requesting Physician:  Dr. Leonie Man MRN #:  [id number]  History of Present Illness: This is a 84 y.o. male with a PMH significant for CAD with CABG and multiple revascularization procedures, PAD, atrial fibrillation, sick sinus syndrome with pacemaker, HTN, COPD, CKD-3, CHF, and BPH who was brought to the hospital by ambulance on 02/21/2018 for leg weakness, dizziness with fall, slurred speech, and left facial droop. He states that on Monday he began experiencing some slurred speech and bilateral leg weakness and then later had some dizziness and a fall without losing consciousness. He had no head trauma, visual disturbances, numbness or tingling, and no upper extremity weakness. He states his speech began improving by Tuesday and he is now at his baseline. Carotid duplex was performed here in the hospital which revealed 80%-99% stenosis of the right ICA with a high bifurcation and tortuous vessels. He is right hand dominant. He states he takes a daily aspirin and a statin. He is not on anticoagulation due to history of GI bleed. He denies history of stroke or TIA. He quit smoking in 1975.  Past Medical History:  Diagnosis Date  . Anemia   . Arthritis   . CAD (coronary artery disease)    a. CABG '89; b. PCI '04; c. 11/2015 Cath/PCI: LM 20ost, LAD 155m, LCX 30p, OM2 40, RCA 30p, 10p ISR, 90d (3.0x12 Synergy DES), AM 90, VG->OM2 known to be 100, LIMA->LAD not injected, patent in 2015.  Marland Kitchen Chronic combined systolic and diastolic CHF (congestive heart failure) ([place])   . CKD (chronic kidney disease), stage III ([place])   . H/O: GI bleed    REMOTE HISTORY  . History of COPD   . Hyperlipidemia   . Hypertensive heart disease   . LBBB (left bundle branch block)   . Pacemaker Sept 2009   St Jude  . PAF (paroxysmal atrial fibrillation) (HCC)    a. not on anticoag due to prior GIB.  . Sick sinus syndrome [facility]) Sept 2009   ST Jude PTVDP     Past Surgical History:  Procedure Laterality Date  . CARDIAC CATHETERIZATION  11/2011   EF 50%; significant native CAD w/80% stenosis of the LAD after 2nd giagonal vessel and septal perforating artery w/total occlusion of the mid left anterior descendiung.; 60-70% ostiasl stenosis in the circumflex vessel followed by 40% proximal stenosis and 70-80% distal circumflex stenosis;   . CARDIAC CATHETERIZATION N/A 11/29/2015   Procedure: Left Heart Cath and Cors/Grafts Angiography;  Surgeon: Burnell Blanks, MD;  Location: [place] [facility];  Service: Cardiovascular;  Laterality: N/A;  . CARDIAC CATHETERIZATION N/A 11/29/2015   Procedure: Coronary Stent Intervention;  Surgeon: Burnell Blanks, MD;  Location: [place] [facility];  Service: Cardiovascular;  Laterality: N/A;  . CATARACT EXTRACTION  2004  . CORONARY ANGIOGRAPHY N/A 02/25/2017   Procedure: Coronary Angiography;  Surgeon: Belva Crome, MD;  Location: [place] [facility];  Service: Cardiovascular;  Laterality: N/A;  . CORONARY ANGIOPLASTY WITH STENT PLACEMENT  2004   RCA  . CORONARY ARTERY BYPASS GRAFT  1989   had LIMA to his LAD, a vein to the circumflex. In 2004 underwent stenting to his right coronary artery.  . CORONARY STENT INTERVENTION N/A 02/24/2017   Procedure: Coronary Stent Intervention;  Surgeon: Wellington Hampshire, MD;  Location: [place] [facility];  Service: Cardiovascular;  Laterality: N/A;  cfx  . HEMORRHOID SURGERY    .  INSERT / REPLACE / REMOVE PACEMAKER  07/17/08   DUAL-CHAMBER; PPM-ST.JUDE MEDNET  . LEFT HEART CATH AND CORS/GRAFTS ANGIOGRAPHY N/A 02/24/2017   Procedure: Left Heart Cath and Cors/Grafts Angiography;  Surgeon: Wellington Hampshire, MD;  Location: [place] [facility];  Service: Cardiovascular;  Laterality: N/A;  . LEFT HEART CATHETERIZATION WITH CORONARY/GRAFT ANGIOGRAM N/A 12/10/2011   Procedure: LEFT HEART CATHETERIZATION WITH Beatrix Fetters;  Surgeon: Troy Sine, MD;  Location: [facility] CATH  LAB;  Service: Cardiovascular;  Laterality: N/A;  . LEFT HEART CATHETERIZATION WITH CORONARY/GRAFT ANGIOGRAM N/A 01/26/2014   Procedure: LEFT HEART CATHETERIZATION WITH Beatrix Fetters;  Surgeon: Troy Sine, MD;  Location: [facility] CATH LAB;  Service: Cardiovascular;  Laterality: N/A;  . PPM GENERATOR CHANGEOUT N/A 12/15/2017   Procedure: PPM GENERATOR CHANGEOUT;  Surgeon: Evans Lance, MD;  Location: [place] [facility];  Service: Cardiovascular;  Laterality: N/A;  . PROSTATECTOMY      No Known Allergies  Prior to Admission medications   Medication Sig Start Date End Date Taking? Authorizing Provider  albuterol (PROVENTIL HFA;VENTOLIN HFA) 108 (90 Base) MCG/ACT inhaler Inhale 2 puffs into the lungs every 4 (four) hours as needed for wheezing or shortness of breath (or coughing). Patient taking differently: Inhale 1 puff into the lungs every 4 (four) hours as needed for wheezing or shortness of breath (or coughing).  9/52/84  Yes Delora Fuel, MD  amLODipine (NORVASC) 10 MG tablet Take 1 tablet (10 mg total) by mouth daily. Patient taking differently: Take 10 mg by mouth See admin instructions. Taking 5mg  twice daily 01/17/18  Yes Troy Sine, MD  aspirin 81 MG tablet Take 81 mg by mouth daily.    Yes [provider]  atorvastatin (LIPITOR) 40 MG tablet Take 1 tablet (40 mg total) by mouth daily. 09/18/15  Yes Troy Sine, MD  carvedilol (COREG) 25 MG tablet Take 25 mg by mouth 2 (two) times daily with a meal.     Yes [provider]  cholecalciferol (VITAMIN D) 1000 units tablet Take 1,000 Units by mouth daily.   Yes [provider]  clopidogrel (PLAVIX) 75 MG tablet Take 75 mg by mouth daily at 3 pm.    Yes [provider]  docusate sodium (COLACE) 100 MG capsule Take 1 capsule (100 mg total) by mouth daily as needed for mild constipation. 02/26/17  Yes Bhagat, Bhavinkumar, PA  ezetimibe (ZETIA) 10 MG tablet Take 10 mg by mouth daily.   Yes  [provider]  ferrous sulfate 325 (65 FE) MG tablet Take 325 mg by mouth 2 (two) times daily with a meal.    Yes [provider]  furosemide (LASIX) 20 MG tablet Take 20 mg by mouth daily.   Yes [provider]  isosorbide mononitrate (IMDUR) 60 MG 24 hr tablet Take 90mg  (1.5 tablets) in the AM and 30mg  (1/2 tablet) in the PM Patient taking differently: Take 30-90 mg by mouth See admin instructions. Take 90mg  (1.5 tablets) in the AM and 30mg  (1/2 tablet) in the PM 10/26/17  Yes Troy Sine, MD  latanoprost (XALATAN) 0.005 % ophthalmic solution Place 1 drop into both eyes 2 (two) times a week.  11/07/15  Yes [provider]  levothyroxine (SYNTHROID, LEVOTHROID) 125 MCG tablet TAKE 1 TABLET BY MOUTH EVERY DAY BEFORE BREAKFAST Patient taking differently: TAKE 125 MCG BY MOUTH EVERY DAY BEFORE BREAKFAST 03/04/17  Yes Troy Sine, MD  Menthol, Topical Analgesic, (ICY HOT EX) Apply 1  application topically daily as needed (aches and pains).   Yes [provider]  nitroGLYCERIN (NITROSTAT) 0.4 MG SL tablet PLACE 1 TABLET UNDER THE TONGUE AS NEEDED. Patient taking differently: PLACE 0.4 MG UNDER THE TONGUE AS NEEDED FOR CHEST PAIN. 07/06/16  Yes Troy Sine, MD  pantoprazole (PROTONIX) 40 MG tablet Take 40 mg by mouth 2 (two) times daily.   Yes [provider]  ranolazine (RANEXA) 1000 MG SR tablet Take 1 tablet (1,000 mg total) by mouth 2 (two) times daily. 11/27/15  Yes Troy Sine, MD  tamsulosin (FLOMAX) 0.4 MG CAPS capsule Take 0.4 mg by mouth daily. 02/26/15  Yes [provider]  albuterol (PROVENTIL) (2.5 MG/3ML) 0.083% nebulizer solution Take 3 mLs (2.5 mg total) by nebulization every 4 (four) hours as needed for wheezing or shortness of breath. Patient not taking: Reported on 05/28/9469 9/62/83   Delora Fuel, MD    Social History   Socioeconomic History  . Marital status: Married    Spouse name: Not on file  .  Number of children: 5  . Years of education: Not on file  . Highest education level: Not on file  Occupational History  . Occupation: RETIRED    Employer: RETIRED    Comment: [employer]  . Financial resource strain: Not on file  . Food insecurity:    Worry: Not on file    Inability: Not on file  . Transportation needs:    Medical: Not on file    Non-medical: Not on file  Tobacco Use  . Smoking status: Former Smoker    Packs/day: 2.00    Years: 65.00    Pack years: 130.00    Types: Cigarettes  . Smokeless tobacco: Former Systems developer    Quit date: 10/29/2008  Substance and Sexual Activity  . Alcohol use: No  . Drug use: No  . Sexual activity: Not on file  Lifestyle  . Physical activity:    Days per week: Not on file    Minutes per session: Not on file  . Stress: Not on file  Relationships  . Social connections:    Talks on phone: Not on file    Gets together: Not on file    Attends religious service: Not on file    Active member of club or organization: Not on file    Attends meetings of clubs or organizations: Not on file    Relationship status: Not on file  . Intimate partner violence:    Fear of current or ex partner: Not on file    Emotionally abused: Not on file    Physically abused: Not on file    Forced sexual activity: Not on file  Other Topics Concern  . Not on file  Social History Narrative  . Not on file     Family History  Problem Relation Age of Onset  . Diabetes Father   . Heart disease Sister   . Heart disease Brother   . Heart attack Brother   . Stroke Brother   . Hypertension Neg Hx     ROS: [x]  Positive   [ ]  Negative   [ ]  All sytems reviewed and are negative  Cardiac: []  chest pain/pressure [x]  PAF (not on AC due to history of GI bleed) []  SOB lying flat []  DOE  Vascular: []  pain in legs while walking []  pain in legs at rest []  pain in legs at night []  non-healing ulcers []  hx of DVT [  x] swelling in  legs  Pulmonary: []  productive cough [x]  asthma/wheezing []  home O2  Neurologic: [x]  weakness in []  arms [x]  both legs []  numbness in []  arms []  legs []  hx of CVA []  mini stroke [x] difficulty speaking or slurred speech []  temporary loss of vision in one eye [x]  dizziness  Hematologic: []  hx of cancer []  bleeding problems []  problems with blood clotting easily  Endocrine:   []  diabetes [x]  thyroid disease  GI [x]  history of GI Bleed   GU: [x]  CKD/renal failure []  HD--[]  M/W/F or []  T/T/S [x]  BPH with history of TURP  Psychiatric: []  anxiety []  depression  Musculoskeletal: [x]  arthritis []  joint pain  Integumentary: []  rashes []  ulcers  Constitutional: []  fever []  chills   Physical Examination  Vitals:   02/23/18 0739 02/23/18 0758  BP: 117/75   Pulse: 99   Resp: 20   Temp: 98.7 F (37.1 C)   SpO2: 90% 94%   Body mass index is 24.37 kg/m.  General:  WDWN in NAD Gait: Not observed HENT: WNL, normocephalic Pulmonary: expiratory wheezing bilaterally Cardiac: regular, with murmur, no rubs or gallops; with right carotid bruit Abdomen: soft, NT/ND, no masses Skin: without rashes Vascular Exam/Pulses:  Right Left  Radial 2+ (normal) 2+ (normal)  Femoral 2+ (normal) 2+ (normal)  DP Non palpable Non palpable  PT Non palpable Non palpable   Extremities: without ischemic changes, without Gangrene , without cellulitis; without open wounds; bilateral lower extremity with dry, flaky skin Musculoskeletal: no muscle wasting or atrophy  Neurologic: A&O X 3;  No focal weakness or paresthesias are detected; speech is fluent/normal Psychiatric:  The pt has Normal affect.   CBC    Component Value Date/Time   WBC 7.5 02/23/2018 0505   RBC 3.21 (L) 02/23/2018 0505   HGB 10.3 (L) 02/23/2018 0505   HGB 10.3 (L) 12/10/2017 1203   HGB 11.0 (L) 12/25/2016 1030   HCT 30.5 (L) 02/23/2018 0505   HCT 31.8 (L) 12/10/2017 1203   HCT 32.2 (L) 12/25/2016 1030   PLT  120 (L) 02/23/2018 0505   PLT 129 (L) 12/10/2017 1203   MCV 95.0 02/23/2018 0505   MCV 99 (H) 12/10/2017 1203   MCV 96.0 12/25/2016 1030   MCH 32.1 02/23/2018 0505   MCHC 33.8 02/23/2018 0505   RDW 14.1 02/23/2018 0505   RDW 15.4 12/10/2017 1203   RDW 13.9 12/25/2016 1030   LYMPHSABS 0.4 (L) 02/23/2018 0505   LYMPHSABS 1.0 12/25/2016 1030   MONOABS 0.2 02/23/2018 0505   MONOABS 0.6 12/25/2016 1030   EOSABS 0.0 02/23/2018 0505   EOSABS 0.3 12/25/2016 1030   BASOSABS 0.0 02/23/2018 0505   BASOSABS 0.0 12/25/2016 1030    BMET    Component Value Date/Time   NA 137 02/23/2018 0505   NA 140 12/10/2017 1203   NA 145 01/19/2014 0814   K 3.8 02/23/2018 0505   K 3.8 01/19/2014 0814   CL 105 02/23/2018 0505   CL 109 (H) 01/20/2013 0842   CO2 23 02/23/2018 0505   CO2 26 01/19/2014 0814   GLUCOSE 144 (H) 02/23/2018 0505   GLUCOSE 98 01/19/2014 0814   GLUCOSE 114 (H) 01/20/2013 0842   BUN 28 (H) 02/23/2018 0505   BUN 23 12/10/2017 1203   BUN 17.1 01/19/2014 0814   CREATININE 1.46 (H) 02/23/2018 0505   CREATININE 1.23 (H) 01/14/2017 0831   CREATININE 1.2 01/19/2014 0814   CALCIUM 8.6 (L) 02/23/2018 0505   CALCIUM 9.4 01/19/2014 2703  GFRNONAA 40 (L) 02/23/2018 0505   GFRNONAA 54 (L) 01/23/2014 1126   GFRAA 47 (L) 02/23/2018 0505   GFRAA 62 01/23/2014 1126    COAGS: Lab Results  Component Value Date   INR 1.26 02/22/2018   INR 1.2 12/10/2017   INR 1.19 02/23/2017     Non-Invasive Vascular Imaging:   Carotid Duplex 02/22/2018 80%-99% stenosis of right ICA with high bifurcation and tortuous vessels. 40%-59% stenosis of left ICA Bilateral vertebral artery antegrade  Statin:  Yes.   Beta Blocker:  Yes.   Aspirin:  Yes.   ACEI:  No. ARB:  No. CCB use:  Yes Other antiplatelets/anticoagulants:  Yes.   Plavix   ASSESSMENT/PLAN: This is a 84 y.o. male with symptomatic carotid stenosis.   Unfortunately the carotid duplex showed a high bifurcation which may not be  accessible by carotid endarterectomy. To further define the anatomy we will order a non-contrast CT scan due to renal function. May need additional CT with contrast for further information to decide between endarterectomy or TCAR. If contrast CT is indicated, would like patient to come into hospital for pre-hydration due to CKD.  Patient may benefit from home PT or inpatient rehab.  Recommend nephrology consult for CKD per Dr. Trula Slade.     Melody Haver, PA-S [facility] [phone]  I agree with the above.  I seen and evaluated the patient and agree with the above plan.  I had a lengthy discussion with the patient and his daughters at the bedside.  He has also been seen by neurology who feel that this is cerebral hypoperfusion causing his symptoms.  His ultrasound showed a greater than 80% right carotid stenosis.  The bifurcation was noted to be very high.  I discussed with the family that I would need more information in order to determine what the most appropriate course of action is.  The best way to get this information would be a CT angiogram, however the patient's creatinine was elevated on admission.  It is down to 1.5 today.  The patient also states that he had a mild heart attack the last time he received contrast.  For these 2 reasons I will begin further evaluation with a noncontrasted CT scan of the head and neck to see if I can locate the patient's bifurcation and determine the distal extent of the stenosis.  Hopefully I will also be able to determine if he is a candidate for TCAR.  The patient and family understand that a noncontrasted CT scan may not be able to fully determine what the most appropriate course of action is, and if that is the case he would need a CT angiogram.  I would like for him to be hydrated before doing this.  I will try to get a noncontrasted scan tonight and we can make plans for whether or not he needs a CT angiogram after the study has been  completed.  I told the patient and family that once the workup has been completed, he could potentially be discharged and return after the holiday for potential procedure.  The patient and family would also like formal evaluation from nephrology.  He would also benefit from home physical therapy or possibly inpatient rehabilitation given his weakness.  I will follow-up tomorrow after his CT scan  Wells Cortne Amara [phone]

## 2018-02-23 NOTE — Care Management Obs Status (Signed)
[place] NOTIFICATION   Patient Details  Name: John Peck MRN: [id number] Date of Birth: 1924-02-21   Medicare Observation Status Notification Given:  Yes    Pollie Friar, RN 02/23/2018, 4:36 PM

## 2018-02-23 NOTE — Progress Notes (Signed)
Patient ha d issue breathing wheezing and mucus in upper bronchial, gave breathing treatment and obtained order for lasix 20 mg also gave PRN Mucinex DM al of which improved patients ability to breath.

## 2018-02-23 NOTE — Progress Notes (Signed)
Stroke Team Progress Note      SUBJECTIVE  He is accompanied by his daughter today. Patient is willing to consider carotid revascularization but wants more information about the risk-benefit and wants to talk to the vascular surgeon  OBJECTIVE Most recent Vital Signs: Temp: 98.7 F (37.1 C) (04/17 0739) Temp Source: Axillary (04/17 0739) BP: 117/75 (04/17 0739) Pulse Rate: 99 (04/17 0739) Respiratory Rate: 20 O2 Saturdation: 94%  CBG (last 3)  Recent Labs    02/22/18 0114  GLUCAP 102*    Diet: Fall precautions Diet Heart Room service appropriate? Yes; Fluid consistency: Thin    Activity: Up with assistance   VTE Prophylaxis:  scds  Studies: Results for orders placed or performed during the hospital encounter of 02/22/18 (from the past 24 hour(s))  Renal function panel     Status: Abnormal   Collection Time: 02/23/18  5:05 AM  Result Value Ref Range   Sodium 137 135 - 145 mmol/L   Potassium 3.8 3.5 - 5.1 mmol/L   Chloride 105 101 - 111 mmol/L   CO2 23 22 - 32 mmol/L   Glucose, Bld 144 (H) 65 - 99 mg/dL   BUN 28 (H) 6 - 20 mg/dL   Creatinine, Ser 1.46 (H) 0.61 - 1.24 mg/dL   Calcium 8.6 (L) 8.9 - 10.3 mg/dL   Phosphorus 3.0 2.5 - 4.6 mg/dL   Albumin 3.4 (L) 3.5 - 5.0 g/dL   GFR calc non Af Amer 40 (L) >60 mL/min   GFR calc Af Amer 47 (L) >60 mL/min   Anion gap 9 5 - 15  CBC with Differential/Platelet     Status: Abnormal   Collection Time: 02/23/18  5:05 AM  Result Value Ref Range   WBC 7.5 4.0 - 10.5 K/uL   RBC 3.21 (L) 4.22 - 5.81 MIL/uL   Hemoglobin 10.3 (L) 13.0 - 17.0 g/dL   HCT 30.5 (L) 39.0 - 52.0 %   MCV 95.0 78.0 - 100.0 fL   MCH 32.1 26.0 - 34.0 pg   MCHC 33.8 30.0 - 36.0 g/dL   RDW 14.1 11.5 - 15.5 %   Platelets 120 (L) 150 - 400 K/uL   Neutrophils Relative % 91 %   Neutro Abs 6.9 1.7 - 7.7 K/uL   Lymphocytes Relative 6 %   Lymphs Abs 0.4 (L) 0.7 - 4.0 K/uL   Monocytes Relative 3 %   Monocytes Absolute 0.2 0.1 - 1.0 K/uL   Eosinophils  Relative 0 %   Eosinophils Absolute 0.0 0.0 - 0.7 K/uL   Basophils Relative 0 %   Basophils Absolute 0.0 0.0 - 0.1 K/uL  Hemoglobin A1c     Status: None   Collection Time: 02/23/18  5:05 AM  Result Value Ref Range   Hgb A1c MFr Bld 5.5 4.8 - 5.6 %   Mean Plasma Glucose 111.15 mg/dL     Dg Chest 2 View  Result Date: 02/22/2018 CLINICAL DATA:  Wheezing with shortness of breath EXAM: CHEST - 2 VIEW COMPARISON:  05/18/2017 FINDINGS: Post sternotomy changes. Left-sided pacing device as before. Trace pleural effusions. No focal consolidation. Mild cardiomegaly with aortic atherosclerosis. No pneumothorax. IMPRESSION: 1. Trace pleural effusions. 2. Mild cardiomegaly Electronically Signed   By: Donavan Foil M.D.   On: 02/22/2018 02:15   Ct Head Wo Contrast  Result Date: 02/22/2018 CLINICAL DATA:  Acute onset of generalized weakness. Abnormal gait. Left-sided facial droop. EXAM: CT HEAD WITHOUT CONTRAST TECHNIQUE: Contiguous axial images were obtained from the base  of the skull through the vertex without intravenous contrast. COMPARISON:  CT of the head performed 05/09/2015 FINDINGS: Brain: No evidence of acute infarction, hemorrhage, hydrocephalus, extra-axial collection or mass lesion / mass effect. Prominence of the ventricles and sulci reflects moderate cortical volume loss. Cerebellar atrophy is noted. Scattered periventricular and subcortical white matter change likely reflects small vessel ischemic microangiopathy. The brainstem and fourth ventricle are within normal limits. The basal ganglia are unremarkable in appearance. The cerebral hemispheres demonstrate grossly normal gray-white differentiation. No mass effect or midline shift is seen. Vascular: No hyperdense vessel or unexpected calcification. Skull: There is no evidence of fracture; visualized osseous structures are unremarkable in appearance. Sinuses/Orbits: The orbits are within normal limits. There is partial opacification of the right  ethmoid air cells. The paranasal sinuses and mastoid air cells are well-aerated. Other: No significant soft tissue abnormalities are seen. IMPRESSION: 1. No acute intracranial pathology seen on CT. 2. Moderate cortical volume loss and scattered small vessel ischemic microangiopathy. Electronically Signed   By: Garald Balding M.D.   On: 02/22/2018 02:10  Transcranial Dopplers : Elevated mean flow velocities in in the left middle and anterior cerebral artery suggests mild stenosis. Unable to insonated posterior circulation vessels due to poor occipital windows bilaterally.  HDL cholesterol 58 mg percent.  Hemoglobin A1c 5.8  Physical Exam:    Pleasant elderly male currently not in distress. . Afebrile. Head is nontraumatic. Neck is supple without bruit.    Cardiac exam no murmur or gallop. Lungs are clear to auscultation. Distal pulses are well felt. Neurological Exam ;  Awake  Alert oriented x 3. Normal speech and language.eye movements full without nystagmus.fundi were not visualized. Vision acuity and fields appear normal. Hearing is normal. Palatal movements are normal. Face symmetric. Tongue midline. Normal strength, tone, reflexes and coordination. Normal sensation. Gait deferred.  ASSESSMENT [name] is a 84 y.o. male with a  Recurrent episodes of near syncope upon standing possibly related to cerebral hypoperfusion  with high-grade right carotid stenosis on ultrasound  Rain imaging is negative an MRI cannot be obtained due to pacemaker.  He has history of atrial fibrillation but has not been on long-term anticoagulation given her remote history of GI bleed and advanced age Hospital day # 0  TREATMENT/PLAN    Continue aspirin for stroke prevention. Case discussed with Dr.Wells Brabham. Vascular surgery consult to consider elective right carotid revascularization. Patient seems reluctant to consider angioplasty stenting since he states his had several cardiac stents. Long discussion  with the patient and his daughter at the bedside and answered questions about his care.  Stroke team will sign off. Kindly call for questions. Follow-up as an outpatient in the stroke clinic in 6 weeks Antony Contras, MD [facility] Pager: [phone] 02/23/2018 5:21 PM

## 2018-02-24 DIAGNOSIS — Z862 Personal history of diseases of the blood and blood-forming organs and certain disorders involving the immune mechanism: Secondary | ICD-10-CM | POA: Diagnosis not present

## 2018-02-24 DIAGNOSIS — E785 Hyperlipidemia, unspecified: Secondary | ICD-10-CM | POA: Diagnosis not present

## 2018-02-24 DIAGNOSIS — I480 Paroxysmal atrial fibrillation: Secondary | ICD-10-CM | POA: Diagnosis not present

## 2018-02-24 DIAGNOSIS — R69 Illness, unspecified: Secondary | ICD-10-CM | POA: Diagnosis not present

## 2018-02-24 DIAGNOSIS — I6521 Occlusion and stenosis of right carotid artery: Secondary | ICD-10-CM | POA: Diagnosis not present

## 2018-02-24 DIAGNOSIS — G4733 Obstructive sleep apnea (adult) (pediatric): Secondary | ICD-10-CM | POA: Diagnosis not present

## 2018-02-24 DIAGNOSIS — I5032 Chronic diastolic (congestive) heart failure: Secondary | ICD-10-CM | POA: Diagnosis not present

## 2018-02-24 DIAGNOSIS — G4731 Primary central sleep apnea: Secondary | ICD-10-CM | POA: Diagnosis not present

## 2018-02-24 DIAGNOSIS — D696 Thrombocytopenia, unspecified: Secondary | ICD-10-CM | POA: Diagnosis not present

## 2018-02-24 DIAGNOSIS — N179 Acute kidney failure, unspecified: Secondary | ICD-10-CM | POA: Diagnosis not present

## 2018-02-24 DIAGNOSIS — N183 Chronic kidney disease, stage 3 (moderate): Secondary | ICD-10-CM | POA: Diagnosis not present

## 2018-02-24 DIAGNOSIS — J441 Chronic obstructive pulmonary disease with (acute) exacerbation: Secondary | ICD-10-CM | POA: Diagnosis not present

## 2018-02-24 DIAGNOSIS — Z951 Presence of aortocoronary bypass graft: Secondary | ICD-10-CM | POA: Diagnosis not present

## 2018-02-24 LAB — CBC
HEMATOCRIT: 28.7 % — AB (ref 39.0–52.0)
Hemoglobin: 9.7 g/dL — ABNORMAL LOW (ref 13.0–17.0)
MCH: 32.1 pg (ref 26.0–34.0)
MCHC: 33.8 g/dL (ref 30.0–36.0)
MCV: 95 fL (ref 78.0–100.0)
PLATELETS: 121 10*3/uL — AB (ref 150–400)
RBC: 3.02 MIL/uL — ABNORMAL LOW (ref 4.22–5.81)
RDW: 14.5 % (ref 11.5–15.5)
WBC: 7.7 10*3/uL (ref 4.0–10.5)

## 2018-02-24 LAB — BASIC METABOLIC PANEL
ANION GAP: 8 (ref 5–15)
BUN: 30 mg/dL — AB (ref 6–20)
CO2: 24 mmol/L (ref 22–32)
Calcium: 8.3 mg/dL — ABNORMAL LOW (ref 8.9–10.3)
Chloride: 107 mmol/L (ref 101–111)
Creatinine, Ser: 1.29 mg/dL — ABNORMAL HIGH (ref 0.61–1.24)
GFR calc Af Amer: 54 mL/min — ABNORMAL LOW (ref >60)
GFR, EST NON AFRICAN AMERICAN: 47 mL/min — AB (ref >60)
GLUCOSE: 126 mg/dL — AB (ref 65–99)
Potassium: 3.8 mmol/L (ref 3.5–5.1)
Sodium: 139 mmol/L (ref 135–145)

## 2018-02-24 MED ORDER — FUROSEMIDE 20 MG PO TABS: 10.0000 mg | ORAL_TABLET | Freq: Every day | ORAL | Status: DC

## 2018-02-24 MED ORDER — IPRATROPIUM-ALBUTEROL 0.5-2.5 (3) MG/3ML IN SOLN: 3.0000 mL | Freq: Two times a day (BID) | RESPIRATORY_TRACT | 1 refills | Status: AC

## 2018-02-24 MED ORDER — PREDNISONE 20 MG PO TABS: ORAL_TABLET | ORAL | 0 refills | Status: DC

## 2018-02-24 NOTE — Care Management Note (Signed)
Case Management Note  Patient Details  Name: John Peck MRN: [id number] Date of Birth: [birth year]  Subjective/Objective:                    Action/Plan: Pt discharging home with orders for [facility] services and nebulizer machine. CM met with the patient and his family and provided choice. They selected [facility]. Butch Penny with [facility] notified and accepted the referral.  Butch Penny will also deliver the nebulizer machine to the room.  Family to provide supervision at home and transportation home.    Expected Discharge Date:  02/24/18               Expected Discharge Plan:  [place]  In-House Referral:     Discharge planning Services  CM Consult  Post Acute Care Choice:  Home Health, Durable Medical Equipment Choice offered to:  Patient, Spouse, Adult Children  DME Arranged:  Nebulizer/meds, Nebulizer machine DME Agency:  [place]:  PT, Social Work [employer] Agency:  [place]  Status of Service:  Completed, signed off  If discussed at [employer], dates discussed:    Additional Comments:  Pollie Friar, RN 02/24/2018, 1:43 PM

## 2018-02-24 NOTE — Discharge Summary (Signed)
Physician Discharge Summary  John Peck LZJ:[id number] DOB: 01-02-1924 DOA: 02/22/2018  PCP: John Carol, MD  Admit date: 02/22/2018 Discharge date: 02/24/2018  Time spent: 45 minutes  Recommendations for Outpatient Follow-up:  Patient will be discharged to home with home health physical therapy.   Patient will need to follow up with primary care provider within one week of discharge. Follow up with John Peck, vascular surgery.  Follow up with John Peck, neurologist, in 6 weeks.  Follow up with John Peck.  Follow up with cardiology and discuss medications changes. Patient should continue medications as prescribed.   Patient should follow a heart healthy diet.   Discharge Diagnoses:  Facial droop with difficulty speaking Mild COPD exacerbation CAD Iron deficiency anemia Chronic systolic/diastolic heart failure Paroxysmal atrial fibrillation Acute kidney injury on chronic kidney disease, stage III Hyperlipidemia Chronic thrombocytopenia Hypothyroidism  Discharge Condition: Stable  Diet recommendation: heart healthy  Filed Weights   02/22/18 0047 02/22/18 0058  Weight: 74.8 kg (165 lb) 74.8 kg (165 lb)    History of present illness:  on 4/16/219 by Dr. Coralyn Pear Peck a 84 y.o.malewith medical history significant ofPAF not on AC due to GIB, hypertension, hyperlipidemia, COPD, GERD, hypothyroidism, sick sinus syndrome, pacemaker placement, left bundle blockage, CKD-3, CHF with EF of 40%, CAD, stent placement, CABG, and deficiency anemia, who presents withleft facial droop, difficulty speaking, abnormal gait and leg weakness.  Per EDP, family reported that pt was found to have new and left facial droop, abnormal gait and difficulty speaking at about 11 PM. LKN was 8:00 PM. Pt he states that he has a weakness in both legs. No vision change or hearing loss. He denies numbness or tingling seen in extremities. He states that he has a  shortness breath and dry cough. No chest pain, fever or chills. He states that He sometimes has mild dysuria and discomfort on urination. Denies nausea, vomiting, diarrhea, abdominal pain.  Peck Course:  Facial droop with difficulty speaking -CT head showed no acute intracranial pathology -MRI could not be done due to pacemaker placement -Recent echocardiogram March 2019 showed an EF of 02-40%, grade 2 diastolic dysfunction -Carotid Doppler showed right ICA stenosis 80-99%, left ICA stenosis of 40-59%.  Bilateral vertebral arteries demonstrate antegrade flow. -Transcranial Doppler: Elevated mean flow velocities in in the left middle and anterior cerebral artery suggests mild stenosis. Unable to insonated posterior circulation vessels due to poor occipital windows bilaterally. Normal mean flow velocities and remaining identified vessels of anterior cerebral circulation. -Hemoglobin A1c 5.5, LDL 58 -Neurology consulted and appreciated- feels this episode could be due to cerebral hypoperfusion with high-grade right carotid stenosis noted on ultrasound. -Vascular surgery consulted and appreciated. Discussed with John Peck, plan to see patient later today. Timing of surgery pending.  -CT soft tissue neck: Calcific atherosclerosis of aortic arch and carotid bifurcation -John Peck will contact the patient on 02/28/18 to discuss if he is a TCAR candidate or needs additional imaging. Continue aspirin, plavix, statin -Continue statin, Plavix, aspirin -PT consulted and recommended HH -OT evaluted patient, no further needs  Mild COPD exacerbation -Patient had wheezing noted on auscultation during admission -Chest x-ray reviewed and unremarkable for infection -Placed on nebulizer treatments as needed as well as Solu-Medrol and Mucinex -Will discharge with prednisone taper  CAD -Currently no chest pain -Status post CABG x2 in 1989 -Patient had a catheterization 2013 -Continue aspirin, Zetia,  statin, imdur, ranexa  Iron deficiency anemia -Hemoglobin currently stable, 9.7 -Continue iron  supplementation  Chronic systolic/diastolic heart failure -Chest x-ray unremarkable for edema -Lasix was held due to worsening renal function- discussed with patient and daughter at bedside- may want to try taking every other day or halving home dose- would discuss with cardiologist upon discharge -Monitor intake and output, daily weights -Last echocardiogram 01/24/2018: EF of 71-24%, grade 2 diastolic dysfunction  Paroxysmal atrial fibrillation -Currently not on anticoagulation due to history of GI bleed -Continue Coreg  Acute kidney injury on chronic kidney disease, stage III -Baseline creatinine 1.3-1.5, on admission was 2 -Improved, creatinine 1. 29 today -after review of patient's chart, he has been in stage 3 for the past several years -he has an outpatient appointment with John Peck  Hyperlipidemia -Continue statin, Lipitor  Chronic thrombocytopenia -Platelets stable, continue to monitor CBC  Hypothyroidism -Continue Synthroid  Parotid gland nodule -found incidentally on CT neck: 29mm nodule within superficial lobe of left parotid gland- f/up with facial MRI with and without contrast  -discussed with daughter, it seems that has been followed as an outpatient  Consultants Neurology Vascular surgery  Procedures  Carotid ultrasound Transcranial Doppler  Discharge Exam: Vitals:   02/24/18 0855 02/24/18 1138  BP:  129/63  Pulse: 62 (!) 59  Resp: 20 17  Temp:  98.2 F (36.8 C)  SpO2: 95% 95%   Patient feeling better today. Denies current chest pain, shortness of breath, abdominal pain, nausea, vomiting, diarrhea, constipation, dizziness, headache.    General: Well developed, well nourished, NAD, younger than stated age  79: NCAT, mucous membranes moist.  Neck: Supple, right carotid bruit  Cardiovascular: S1 S2 auscultated, soft SEM,  RRR  Respiratory: Clear to auscultation bilaterally with equal chest rise  Abdomen: Soft, nontender, nondistended, + bowel sounds  Extremities: warm dry without cyanosis clubbing or edema  Neuro: AAOx3, nonfocal  Psych: Pleasant, appropriate mood and affect  Discharge Instructions Discharge Instructions    DME Nebulizer machine   Complete by:  As directed    Patient needs a nebulizer to treat with the following condition:  COPD (chronic obstructive pulmonary disease) (HCC)   DME Nebulizer/meds   Complete by:  As directed    Patient needs a nebulizer to treat with the following condition:  COPD (chronic obstructive pulmonary disease) ([place])   Discharge instructions   Complete by:  As directed    Patient will be discharged to home with home health physical therapy.   Patient will need to follow up with primary care provider within one week of discharge. Follow up with John Peck, vascular surgery.  Follow up with John Peck, neurologist, in 6 weeks.  Follow up with [facility].  Follow up with cardiology and discuss medications changes. Patient should continue medications as prescribed.   Patient should follow a heart healthy diet.     Allergies as of 02/24/2018   No Known Allergies     Medication List    STOP taking these medications   amLODipine 10 MG tablet Commonly known as:  NORVASC     TAKE these medications   albuterol 108 (90 Base) MCG/ACT inhaler Commonly known as:  PROVENTIL HFA;VENTOLIN HFA Inhale 2 puffs into the lungs every 4 (four) hours as needed for wheezing or shortness of breath (or coughing). What changed:    how much to take  Another medication with the same name was removed. Continue taking this medication, and follow the directions you see here.   aspirin 81 MG tablet Take 81 mg by mouth daily.  atorvastatin 40 MG tablet Commonly known as:  LIPITOR Take 1 tablet (40 mg total) by mouth daily.   carvedilol 25 MG  tablet Commonly known as:  COREG Take 25 mg by mouth 2 (two) times daily with a meal.   cholecalciferol 1000 units tablet Commonly known as:  VITAMIN D Take 1,000 Units by mouth daily.   clopidogrel 75 MG tablet Commonly known as:  PLAVIX Take 75 mg by mouth daily at 3 pm.   docusate sodium 100 MG capsule Commonly known as:  COLACE Take 1 capsule (100 mg total) by mouth daily as needed for mild constipation.   ezetimibe 10 MG tablet Commonly known as:  ZETIA Take 10 mg by mouth daily.   ferrous sulfate 325 (65 FE) MG tablet Take 325 mg by mouth 2 (two) times daily with a meal.   furosemide 20 MG tablet Commonly known as:  LASIX Take 0.5 tablets (10 mg total) by mouth daily. What changed:  how much to take   [place] 1 application topically daily as needed (aches and pains).   ipratropium-albuterol 0.5-2.5 (3) MG/3ML Soln Commonly known as:  DUONEB Take 3 mLs by nebulization 2 (two) times daily.   isosorbide mononitrate 60 MG 24 hr tablet Commonly known as:  IMDUR Take 90mg  (1.5 tablets) in the AM and 30mg  (1/2 tablet) in the PM What changed:    how much to take  how to take this  when to take this  additional instructions   latanoprost 0.005 % ophthalmic solution Commonly known as:  XALATAN Place 1 drop into both eyes 2 (two) times a week.   levothyroxine 125 MCG tablet Commonly known as:  SYNTHROID, LEVOTHROID TAKE 1 TABLET BY MOUTH EVERY DAY BEFORE BREAKFAST What changed:  See the new instructions.   nitroGLYCERIN 0.4 MG SL tablet Commonly known as:  NITROSTAT PLACE 1 TABLET UNDER THE TONGUE AS NEEDED. What changed:  See the new instructions.   pantoprazole 40 MG tablet Commonly known as:  PROTONIX Take 40 mg by mouth 2 (two) times daily.   predniSONE 20 MG tablet Commonly known as:  DELTASONE Take with breakast: Take 3 tablets x 2 days, then 2 tablets x 3 days, then 1 tablet x 3 days   ranolazine 1000 MG SR tablet Commonly known as:   RANEXA Take 1 tablet (1,000 mg total) by mouth 2 (two) times daily.   tamsulosin 0.4 MG Caps capsule Commonly known as:  FLOMAX Take 0.4 mg by mouth daily.            Durable Medical Equipment  (From admission, onward)        Start     Ordered   02/24/18 0000  DME Nebulizer machine    Question:  Patient needs a nebulizer to treat with the following condition  Answer:  COPD (chronic obstructive pulmonary disease) ([place])   02/24/18 1311   02/24/18 0000  DME Nebulizer/meds    Question:  Patient needs a nebulizer to treat with the following condition  Answer:  COPD (chronic obstructive pulmonary disease) ([place])   02/24/18 1311     No Known Allergies Follow-up Information    Polite, Jori Moll, MD. Schedule an appointment as soon as possible for a visit in 1 week(s).   Specialty:  Internal Medicine Why:  Peck follow up Contact information: [address] [place] [address] [phone]        Troy Sine, MD .   Specialty:  Cardiology  Contact information: [address] [place] [address] [phone]        Serafina Mitchell, MD. Call on 02/28/2018.   Specialties:  Vascular Surgery, Cardiology Contact information: [address] [place] [address] [phone]        Kidney, John. Schedule an appointment as soon as possible for a visit in 1 week(s).   Why:  Peck follow up Contact information: [place] [address] [phone]        Garvin Fila, MD. Schedule an appointment as soon as possible for a visit in 6 week(s).   Specialties:  Neurology, Radiology Why:  Peck follow up Contact information: [address] [place] [address] [phone]            The results of significant diagnostics from this hospitalization (including imaging, microbiology, ancillary and laboratory) are listed below for reference.    Significant Diagnostic Studies: Dg Chest 2  View  Result Date: 02/22/2018 CLINICAL DATA:  Wheezing with shortness of breath EXAM: CHEST - 2 VIEW COMPARISON:  05/18/2017 FINDINGS: Post sternotomy changes. Left-sided pacing device as before. Trace pleural effusions. No focal consolidation. Mild cardiomegaly with aortic atherosclerosis. No pneumothorax. IMPRESSION: 1. Trace pleural effusions. 2. Mild cardiomegaly Electronically Signed   By: Donavan Foil M.D.   On: 02/22/2018 02:15   Ct Head Wo Contrast  Result Date: 02/23/2018 CLINICAL DATA:  84 y/o  M; carotid stenosis. EXAM: CT HEAD WITHOUT CONTRAST TECHNIQUE: Contiguous axial images were obtained from the base of the skull through the vertex without intravenous contrast. COMPARISON:  02/22/2018 CT head. FINDINGS: Brain: No evidence of acute infarction, hemorrhage, hydrocephalus, extra-axial collection or mass lesion/mass effect. Mild chronic microvascular ischemic changes and moderate brain parenchymal volume loss. Vascular: Severe calcific atherosclerosis of the carotid siphons. No hyperdense vessel. Skull: Normal. Negative for fracture or focal lesion. Sinuses/Orbits: Partial opacification of right posterior ethmoid air cells. Bilateral intra-ocular lens replacement. Otherwise unremarkable. Other: None. IMPRESSION: 1. No acute intracranial abnormality. 2. Stable mild chronic microvascular ischemic changes and moderate parenchymal volume loss of the brain. Electronically Signed   By: Kristine Garbe M.D.   On: 02/23/2018 22:10   Ct Head Wo Contrast  Result Date: 02/22/2018 CLINICAL DATA:  Acute onset of generalized weakness. Abnormal gait. Left-sided facial droop. EXAM: CT HEAD WITHOUT CONTRAST TECHNIQUE: Contiguous axial images were obtained from the base of the skull through the vertex without intravenous contrast. COMPARISON:  CT of the head performed 05/09/2015 FINDINGS: Brain: No evidence of acute infarction, hemorrhage, hydrocephalus, extra-axial collection or mass lesion / mass  effect. Prominence of the ventricles and sulci reflects moderate cortical volume loss. Cerebellar atrophy is noted. Scattered periventricular and subcortical white matter change likely reflects small vessel ischemic microangiopathy. The brainstem and fourth ventricle are within normal limits. The basal ganglia are unremarkable in appearance. The cerebral hemispheres demonstrate grossly normal gray-white differentiation. No mass effect or midline shift is seen. Vascular: No hyperdense vessel or unexpected calcification. Skull: There is no evidence of fracture; visualized osseous structures are unremarkable in appearance. Sinuses/Orbits: The orbits are within normal limits. There is partial opacification of the right ethmoid air cells. The paranasal sinuses and mastoid air cells are well-aerated. Other: No significant soft tissue abnormalities are seen. IMPRESSION: 1. No acute intracranial pathology seen on CT. 2. Moderate cortical volume loss and scattered small vessel ischemic microangiopathy. Electronically Signed   By: Garald Balding M.D.   On: 02/22/2018 02:10   Ct Soft Tissue  Neck Wo Contrast  Result Date: 02/23/2018 CLINICAL DATA:  84 y/o  M; carotid stenosis. EXAM: CT NECK WITHOUT CONTRAST TECHNIQUE: Multidetector CT imaging of the neck was performed following the standard protocol without intravenous contrast. COMPARISON:  02/22/2018 CT of the head. FINDINGS: Pharynx and larynx: Normal. No mass or swelling. Salivary glands: 12 mm nodule within the superficial lobe of the left parotid gland. Thyroid: Normal. Lymph nodes: None enlarged or abnormal density. Vascular: Calcific atherosclerosis of the aortic arch and carotid siphons. 2 lead pacemaker. Postsurgical changes related to CABG with LIMA and saphenous grafts partially visualized. Limited intracranial: Negative. Visualized orbits: Negative. Mastoids and visualized paranasal sinuses: Clear. Skeleton: No acute or aggressive process. Advanced cervical  spondylosis with multilevel facet and degenerative changes. Flowing anterior ossification compatible with DISH. Healed partially visualize median sternotomy. Upper chest: Negative. Other: 17 mm well-circumscribed low-attenuation dermal lesion of right lateral neck below the right mastoid an 23 mm well-circumscribed dermal low-attenuation lesion of the left face anterior to left parotid gland, likely dermal appendage cyst. IMPRESSION: 1. Calcific atherosclerosis of aortic arch and carotid bifurcation. Status post CABG. 2. Dermal lesions in the right lateral neck below mastoid process and left face anterior to parotid gland, likely dermal appendage cysts. 3. Indeterminate 12 mm nodule within superficial lobe of left parotid gland may represent adenopathy or parotid neoplasm. Follow-up is recommended to ensure stability or further evaluation with facial MRI with and without contrast. 4. Advanced cervical spondylosis.  Findings of DISH. Electronically Signed   By: Kristine Garbe M.D.   On: 02/23/2018 22:20    Microbiology: No results found for this or any previous visit (from the past 240 hour(s)).   Labs: Basic Metabolic Panel: Recent Labs  Lab 02/22/18 0115 02/22/18 0121 02/23/18 0505 02/24/18 0531  NA 137 139 137 139  K 4.9 4.9 3.8 3.8  CL 104 104 105 107  CO2 26  --  23 24  GLUCOSE 102* 102* 144* 126*  BUN 28* 29* 28* 30*  CREATININE 1.94* 2.00* 1.46* 1.29*  CALCIUM 8.4*  --  8.6* 8.3*  PHOS  --   --  3.0  --    Liver Function Tests: Recent Labs  Lab 02/22/18 0115 02/23/18 0505  AST 13*  --   ALT 11*  --   ALKPHOS 83  --   BILITOT 0.8  --   PROT 6.6  --   ALBUMIN 3.6 3.4*   No results for input(s): LIPASE, AMYLASE in the last 168 hours. No results for input(s): AMMONIA in the last 168 hours. CBC: Recent Labs  Lab 02/22/18 0115 02/22/18 0121 02/23/18 0505 02/24/18 0531  WBC 4.2  --  7.5 7.7  NEUTROABS 2.5  --  6.9  --   HGB 9.9* 10.2* 10.3* 9.7*  HCT 29.9*  30.0* 30.5* 28.7*  MCV 96.1  --  95.0 95.0  PLT 119*  --  120* 121*   Cardiac Enzymes: Recent Labs  Lab 02/22/18 1157  TROPONINI <0.03   BNP: BNP (last 3 results) Recent Labs    05/18/17 1953 02/22/18 0143  BNP 276.5* 433.7*    ProBNP (last 3 results) No results for input(s): PROBNP in the last 8760 hours.  CBG: Recent Labs  Lab 02/22/18 0114  GLUCAP 102*       Signed:  Delanee Xin  [facility] 02/24/2018, 1:18 PM

## 2018-02-24 NOTE — Progress Notes (Signed)
PT Cancellation Note  Patient Details Name: John Peck MRN: [id number] DOB: 01-29-1924   Cancelled Treatment:    Reason Eval/Treat Not Completed: Other (comment).  Declined as he is leaving with his wife shortly.     Ramond Dial 02/24/2018, 3:49 PM   Mee Hives, PT MS Acute Rehab Dept. Number: [place] and [place]

## 2018-02-24 NOTE — Progress Notes (Signed)
    Subjective  -   No acute events   Physical Exam:  Neuro intact CTAB CV:RRR       Assessment/Plan:    I discussed the CT scan findings with the daughter and patient.  I am going to have him evaluated for TCAR.  At this time I am uncertain as to whether or not his noncontrasted CT scan of the neck will suffice for adequate preoperative imaging.  The patient was reluctant to proceed with CT angiogram.  I will have him evaluated for TCAR over the next several days.  I will contact the patient on Monday if he is a candidate or if he needs additional imaging.  He will need to remain on his Plavix and aspirin as well as statin.  We will plan for revascularization next week.  He is stable for discharge from my perspective.  Wells Brabham 02/24/2018 11:00 AM --  Vitals:   02/24/18 0747 02/24/18 0855  BP: 128/66   Pulse: 60 62  Resp: 19 20  Temp: 98.5 F (36.9 C)   SpO2: 97% 95%    Intake/Output Summary (Last 24 hours) at 02/24/2018 1100 Last data filed at 02/24/2018 0630 Gross per 24 hour  Intake -  Output 825 ml  Net -825 ml     Laboratory CBC    Component Value Date/Time   WBC 7.7 02/24/2018 0531   HGB 9.7 (L) 02/24/2018 0531   HGB 10.3 (L) 12/10/2017 1203   HGB 11.0 (L) 12/25/2016 1030   HCT 28.7 (L) 02/24/2018 0531   HCT 31.8 (L) 12/10/2017 1203   HCT 32.2 (L) 12/25/2016 1030   PLT 121 (L) 02/24/2018 0531   PLT 129 (L) 12/10/2017 1203    BMET    Component Value Date/Time   NA 139 02/24/2018 0531   NA 140 12/10/2017 1203   NA 145 01/19/2014 0814   K 3.8 02/24/2018 0531   K 3.8 01/19/2014 0814   CL 107 02/24/2018 0531   CL 109 (H) 01/20/2013 0842   CO2 24 02/24/2018 0531   CO2 26 01/19/2014 0814   GLUCOSE 126 (H) 02/24/2018 0531   GLUCOSE 98 01/19/2014 0814   GLUCOSE 114 (H) 01/20/2013 0842   BUN 30 (H) 02/24/2018 0531   BUN 23 12/10/2017 1203   BUN 17.1 01/19/2014 0814   CREATININE 1.29 (H) 02/24/2018 0531   CREATININE 1.23 (H) 01/14/2017  0831   CREATININE 1.2 01/19/2014 0814   CALCIUM 8.3 (L) 02/24/2018 0531   CALCIUM 9.4 01/19/2014 0814   GFRNONAA 47 (L) 02/24/2018 0531   GFRNONAA 54 (L) 01/23/2014 1126   GFRAA 54 (L) 02/24/2018 0531   GFRAA 62 01/23/2014 1126    COAG Lab Results  Component Value Date   INR 1.26 02/22/2018   INR 1.2 12/10/2017   INR 1.19 02/23/2017   No results found for: PTT  Antibiotics Anti-infectives (From admission, onward)   None       V. Leia Alf, M.D. [facility] Office: [phone] Pager:  [phone]

## 2018-02-24 NOTE — Progress Notes (Signed)
Nurse went over discharge with patient and family. Patient and family verbalized understanding of discharge. Patient received a new Chiropodist. Nurse went over instruction on how to use the breathing treatment. Patient discharging home. Taken down in a wheelchair.

## 2018-02-24 NOTE — Discharge Instructions (Signed)

## 2018-02-25 ENCOUNTER — Telehealth: Payer: Self-pay | Admitting: Cardiovascular Disease

## 2018-02-25 NOTE — Telephone Encounter (Signed)
Returned call to Dorian Pod who states that she did not call in but her sister Nevin Bloodgood likely did. Explained that Nevin Bloodgood is not on her dad's DPR. She will call Nevin Bloodgood to determine what she assistance she was needing from our office and call back

## 2018-02-25 NOTE — Telephone Encounter (Signed)
LM for daughter to return call

## 2018-02-25 NOTE — Telephone Encounter (Signed)
Daughter Elllen returned call. She was wanting to make MD aware of her dad's recent hospitalization for stroke and that he is supposed to see vascular surgeon this week. Per chart review, was discharged on 4/18 and Dr. Trula Slade noted patient will need to be evaluated for endarterectomy or TCAR. Informed her that if surgeon wishes to proceed, he will contact Dr. Claiborne Billings for clearance. Will route to MD/RN as Juluis Rainier

## 2018-02-25 NOTE — Telephone Encounter (Signed)
New message  Pt daughter verbalzied that she is calling for RN   She has some questions and conserves pt was recently admitted and DC from [facility]   Please call anytime after 1pm today

## 2018-02-25 NOTE — Telephone Encounter (Signed)
F/U call: ° °Patient returning your call  °

## 2018-03-03 ENCOUNTER — Other Ambulatory Visit: Payer: Self-pay | Admitting: *Deleted

## 2018-03-03 DIAGNOSIS — I639 Cerebral infarction, unspecified: Secondary | ICD-10-CM | POA: Diagnosis not present

## 2018-03-03 DIAGNOSIS — N183 Chronic kidney disease, stage 3 (moderate): Secondary | ICD-10-CM | POA: Diagnosis not present

## 2018-03-03 DIAGNOSIS — I10 Essential (primary) hypertension: Secondary | ICD-10-CM | POA: Diagnosis not present

## 2018-03-03 DIAGNOSIS — E7800 Pure hypercholesterolemia, unspecified: Secondary | ICD-10-CM | POA: Diagnosis not present

## 2018-03-03 DIAGNOSIS — E039 Hypothyroidism, unspecified: Secondary | ICD-10-CM | POA: Diagnosis not present

## 2018-03-03 DIAGNOSIS — D509 Iron deficiency anemia, unspecified: Secondary | ICD-10-CM | POA: Diagnosis not present

## 2018-03-03 DIAGNOSIS — I779 Disorder of arteries and arterioles, unspecified: Secondary | ICD-10-CM | POA: Diagnosis not present

## 2018-03-03 DIAGNOSIS — J449 Chronic obstructive pulmonary disease, unspecified: Secondary | ICD-10-CM | POA: Diagnosis not present

## 2018-03-07 ENCOUNTER — Ambulatory Visit (INDEPENDENT_AMBULATORY_CARE_PROVIDER_SITE_OTHER): Payer: Medicare HMO | Admitting: Surgery

## 2018-03-07 ENCOUNTER — Encounter: Payer: Self-pay | Admitting: *Deleted

## 2018-03-07 ENCOUNTER — Encounter: Payer: Self-pay | Admitting: Surgery

## 2018-03-07 VITALS — BP 122/67 | HR 61 | Temp 96.4°F | Resp 17 | Ht 69.0 in | Wt 170.5 lb

## 2018-03-07 DIAGNOSIS — I6521 Occlusion and stenosis of right carotid artery: Secondary | ICD-10-CM

## 2018-03-07 NOTE — Progress Notes (Signed)
Vascular and Vein Specialist of [facility]  Patient name: John Peck MRN: [id number] DOB: 1924/01/18 Sex: male   REASON FOR VISIT:    Follow up  HISOTRY OF PRESENT ILLNESS:    John Peck is a 84 y.o. male who I saw as a consult in hospital for right carotid stenosis.  He was seen by the stroke service and they felt he was suffering from symptomatic hypotension.  His carotid ultrasound showed a very high bifurcation with tortuous vessels.  The patient was reluctant to get a contrasted CT scan and so a noncontrasted CT scan was performed to see if he is a candidate for TCAR.  He is back today to discuss his options.  He is concerned that he has lost a lot of energy.  He is not having any focal deficits.  He is on dual antiplatelet therapy with aspirin and Plavix.  He is on a statin for hypercholesterolemia.   PAST MEDICAL HISTORY:   Past Medical History:  Diagnosis Date  . Anemia   . Arthritis   . CAD (coronary artery disease)    a. CABG '89; b. PCI '04; c. 11/2015 Cath/PCI: LM 20ost, LAD 159m, LCX 30p, OM2 40, RCA 30p, 10p ISR, 90d (3.0x12 Synergy DES), AM 90, VG->OM2 known to be 100, LIMA->LAD not injected, patent in 2015.  Marland Kitchen Chronic combined systolic and diastolic CHF (congestive heart failure) ([place])   . CKD (chronic kidney disease), stage III ([place])   . H/O: GI bleed    REMOTE HISTORY  . History of COPD   . Hyperlipidemia   . Hypertensive heart disease   . LBBB (left bundle branch block)   . Pacemaker Sept 2009   St Jude  . PAF (paroxysmal atrial fibrillation) ([facility])    a. not on anticoag due to prior GIB.  . Sick sinus syndrome [facility]) Sept 2009   ST Jude PTVDP     FAMILY HISTORY:   Family History  Problem Relation Age of Onset  . Diabetes Father   . Heart disease Sister   . Heart disease Brother   . Heart attack Brother   . Stroke Brother   . Hypertension Neg Hx     SOCIAL HISTORY:   Social History   Tobacco Use    . Smoking status: Former Smoker    Packs/day: 2.00    Years: 65.00    Pack years: 130.00    Types: Cigarettes  . Smokeless tobacco: Former Systems developer    Quit date: 10/29/2008  Substance Use Topics  . Alcohol use: No     ALLERGIES:   No Known Allergies   CURRENT MEDICATIONS:   Current Outpatient Medications  Medication Sig Dispense Refill  . albuterol (PROVENTIL HFA;VENTOLIN HFA) 108 (90 Base) MCG/ACT inhaler Inhale 2 puffs into the lungs every 4 (four) hours as needed for wheezing or shortness of breath (or coughing). (Patient taking differently: Inhale 1 puff into the lungs every 4 (four) hours as needed for wheezing or shortness of breath (or coughing). ) 1 Inhaler 0  . aspirin 81 MG tablet Take 81 mg by mouth daily.     Marland Kitchen atorvastatin (LIPITOR) 40 MG tablet Take 1 tablet (40 mg total) by mouth daily. 30 tablet 6  . carvedilol (COREG) 25 MG tablet Take 25 mg by mouth 2 (two) times daily with a meal.      . cholecalciferol (VITAMIN D) 1000 units tablet Take 1,000 Units by mouth daily.    . clopidogrel (PLAVIX) 75  MG tablet Take 75 mg by mouth daily at 3 pm.     . docusate sodium (COLACE) 100 MG capsule Take 1 capsule (100 mg total) by mouth daily as needed for mild constipation. 10 capsule 0  . ezetimibe (ZETIA) 10 MG tablet Take 10 mg by mouth daily.    . ferrous sulfate 325 (65 FE) MG tablet Take 325 mg by mouth 2 (two) times daily with a meal.     . furosemide (LASIX) 20 MG tablet Take 0.5 tablets (10 mg total) by mouth daily. (Patient taking differently: Take 10 mg by mouth every other day. ) 30 tablet   . ipratropium-albuterol (DUONEB) 0.5-2.5 (3) MG/3ML SOLN Take 3 mLs by nebulization 2 (two) times daily. 360 mL 1  . isosorbide mononitrate (IMDUR) 60 MG 24 hr tablet Take 90mg  (1.5 tablets) in the AM and 30mg  (1/2 tablet) in the PM (Patient taking differently: Take 30-90 mg by mouth See admin instructions. Take 90mg  (1.5 tablets) in the AM and 30mg  (1/2 tablet) in the PM) 180 tablet  3  . latanoprost (XALATAN) 0.005 % ophthalmic solution Place 1 drop into both eyes 2 (two) times a week.   4  . levothyroxine (SYNTHROID, LEVOTHROID) 125 MCG tablet TAKE 1 TABLET BY MOUTH EVERY DAY BEFORE BREAKFAST (Patient taking differently: TAKE 125 MCG BY MOUTH EVERY DAY BEFORE BREAKFAST) 30 tablet 11  . Menthol, Topical Analgesic, (ICY HOT EX) Apply 1 application topically daily as needed (aches and pains).    . nitroGLYCERIN (NITROSTAT) 0.4 MG SL tablet PLACE 1 TABLET UNDER THE TONGUE AS NEEDED. (Patient taking differently: PLACE 0.4 MG UNDER THE TONGUE AS NEEDED FOR CHEST PAIN.) 25 tablet 3  . pantoprazole (PROTONIX) 40 MG tablet Take 40 mg by mouth 2 (two) times daily.    . predniSONE (DELTASONE) 20 MG tablet Take with breakast: Take 3 tablets x 2 days, then 2 tablets x 3 days, then 1 tablet x 3 days 15 tablet 0  . ranolazine (RANEXA) 1000 MG SR tablet Take 1 tablet (1,000 mg total) by mouth 2 (two) times daily. 180 tablet 1  . tamsulosin (FLOMAX) 0.4 MG CAPS capsule Take 0.4 mg by mouth daily.  2   No current facility-administered medications for this visit.     REVIEW OF SYSTEMS:   [X]  denotes positive finding, [ ]  denotes negative finding Cardiac  Comments:  Chest pain or chest pressure:    Shortness of breath upon exertion:    Short of breath when lying flat:    Irregular heart rhythm:        Vascular    Pain in calf, thigh, or hip brought on by ambulation:    Pain in feet at night that wakes you up from your sleep:     Blood clot in your veins:    Leg swelling:         Pulmonary    Oxygen at home:    Productive cough:     Wheezing:         Neurologic    Sudden weakness in arms or legs:     Sudden numbness in arms or legs:     Sudden onset of difficulty speaking or slurred speech:    Temporary loss of vision in one eye:     Problems with dizziness:         Gastrointestinal    Blood in stool:     Vomited blood:         Genitourinary  Burning when urinating:      Blood in urine:        Psychiatric    Major depression:         Hematologic    Bleeding problems:    Problems with blood clotting too easily:        Skin    Rashes or ulcers:        Constitutional    Fever or chills:      PHYSICAL EXAM:   Vitals:   03/07/18 1603 03/07/18 1606  BP: 131/69 122/67  Pulse: 61   Resp: 17   Temp: (!) 96.4 F (35.8 C)   TempSrc: Oral   SpO2: 96%   Weight: 170 lb 8 oz (77.3 kg)   Height: 5\' 9"  (1.753 m)     GENERAL: The patient is a well-nourished male, in no acute distress. The vital signs are documented above. CARDIAC: There is a regular rate and rhythm.  PULMONARY: Non-labored respirations ABDOMEN: Soft and non-tender with normal pitched bowel sounds.  NEUROLOGIC: No focal weakness or paresthesias are detected. SKIN: There are no ulcers or rashes noted. PSYCHIATRIC: The patient has a normal affect.  STUDIES:   Noncontrasted CT scan of the neck was used for measurements for TCAR  MEDICAL ISSUES:   Symptomatic right carotid stenosis: I again discussed options of carotid endarterectomy versus carotid stenting.  After reviewing his CT scan, even though it was without contrast, we feel that he is a candidate for TCAR.  I did discuss with the family that there is the possibility that he may not be a stent candidate and this could not be determined until we inject dye for diagnostic imaging.  Patient would like to wait another week or so to try and gain back some strength.  He has not had any focal symptoms over the weekend.  At the end of replacement was scheduled for Friday May 10    Annamarie Major, MD [facility] and Vein Specialists of [facility] [phone] Pager [phone]

## 2018-03-08 ENCOUNTER — Encounter: Payer: Self-pay | Admitting: *Deleted

## 2018-03-08 NOTE — Progress Notes (Signed)
Patient's daughters Dorian Pod and Nevin Bloodgood spoke with me about holding off on scheduling surgery for their father at this time, due to his age and current condition.They wish to discuss with family and will call surgery scheduler when they are ready to proceed.

## 2018-03-10 ENCOUNTER — Ambulatory Visit: Payer: Medicare HMO | Admitting: Vascular Surgery

## 2018-03-11 ENCOUNTER — Encounter (HOSPITAL_COMMUNITY): Admission: RE | Payer: Self-pay | Source: Ambulatory Visit

## 2018-03-11 ENCOUNTER — Inpatient Hospital Stay (HOSPITAL_COMMUNITY): Admission: RE | Admit: 2018-03-11 | Payer: Medicare HMO | Source: Ambulatory Visit | Admitting: Vascular Surgery

## 2018-03-11 SURGERY — TRANSCAROTID ARTERY REVASCULARIZATION (TCAR)
Anesthesia: Choice | Laterality: Right

## 2018-03-12 DIAGNOSIS — R69 Illness, unspecified: Secondary | ICD-10-CM | POA: Diagnosis not present

## 2018-03-14 ENCOUNTER — Emergency Department (HOSPITAL_COMMUNITY): Payer: Medicare HMO

## 2018-03-14 ENCOUNTER — Encounter (HOSPITAL_COMMUNITY): Payer: Self-pay | Admitting: Emergency Medicine

## 2018-03-14 ENCOUNTER — Inpatient Hospital Stay (HOSPITAL_COMMUNITY): Payer: Medicare HMO

## 2018-03-14 ENCOUNTER — Inpatient Hospital Stay (HOSPITAL_COMMUNITY)
Admission: EM | Admit: 2018-03-14 | Discharge: 2018-03-19 | DRG: 871 | Disposition: A | Discharge status: Skilled Nursing Facility | Payer: Medicare HMO | Attending: Internal Medicine | Admitting: Internal Medicine

## 2018-03-14 ENCOUNTER — Other Ambulatory Visit: Payer: Self-pay

## 2018-03-14 ENCOUNTER — Encounter: Payer: Medicare HMO | Admitting: Internal Medicine

## 2018-03-14 DIAGNOSIS — N400 Enlarged prostate without lower urinary tract symptoms: Secondary | ICD-10-CM | POA: Diagnosis present

## 2018-03-14 DIAGNOSIS — Z7902 Long term (current) use of antithrombotics/antiplatelets: Secondary | ICD-10-CM | POA: Diagnosis not present

## 2018-03-14 DIAGNOSIS — D696 Thrombocytopenia, unspecified: Secondary | ICD-10-CM | POA: Diagnosis present

## 2018-03-14 DIAGNOSIS — I5042 Chronic combined systolic (congestive) and diastolic (congestive) heart failure: Secondary | ICD-10-CM | POA: Diagnosis present

## 2018-03-14 DIAGNOSIS — J441 Chronic obstructive pulmonary disease with (acute) exacerbation: Secondary | ICD-10-CM | POA: Diagnosis present

## 2018-03-14 DIAGNOSIS — Z823 Family history of stroke: Secondary | ICD-10-CM

## 2018-03-14 DIAGNOSIS — N401 Enlarged prostate with lower urinary tract symptoms: Secondary | ICD-10-CM | POA: Diagnosis not present

## 2018-03-14 DIAGNOSIS — Z7951 Long term (current) use of inhaled steroids: Secondary | ICD-10-CM

## 2018-03-14 DIAGNOSIS — Z955 Presence of coronary angioplasty implant and graft: Secondary | ICD-10-CM

## 2018-03-14 DIAGNOSIS — J449 Chronic obstructive pulmonary disease, unspecified: Secondary | ICD-10-CM | POA: Diagnosis present

## 2018-03-14 DIAGNOSIS — J42 Unspecified chronic bronchitis: Secondary | ICD-10-CM

## 2018-03-14 DIAGNOSIS — Z79890 Hormone replacement therapy (postmenopausal): Secondary | ICD-10-CM

## 2018-03-14 DIAGNOSIS — E860 Dehydration: Secondary | ICD-10-CM | POA: Diagnosis present

## 2018-03-14 DIAGNOSIS — N179 Acute kidney failure, unspecified: Secondary | ICD-10-CM | POA: Diagnosis present

## 2018-03-14 DIAGNOSIS — I2510 Atherosclerotic heart disease of native coronary artery without angina pectoris: Secondary | ICD-10-CM | POA: Diagnosis not present

## 2018-03-14 DIAGNOSIS — J189 Pneumonia, unspecified organism: Secondary | ICD-10-CM | POA: Diagnosis not present

## 2018-03-14 DIAGNOSIS — R079 Chest pain, unspecified: Secondary | ICD-10-CM | POA: Diagnosis present

## 2018-03-14 DIAGNOSIS — R0603 Acute respiratory distress: Secondary | ICD-10-CM

## 2018-03-14 DIAGNOSIS — I481 Persistent atrial fibrillation: Secondary | ICD-10-CM | POA: Diagnosis not present

## 2018-03-14 DIAGNOSIS — Z8249 Family history of ischemic heart disease and other diseases of the circulatory system: Secondary | ICD-10-CM

## 2018-03-14 DIAGNOSIS — H1010 Acute atopic conjunctivitis, unspecified eye: Secondary | ICD-10-CM | POA: Diagnosis present

## 2018-03-14 DIAGNOSIS — Z87891 Personal history of nicotine dependence: Secondary | ICD-10-CM | POA: Diagnosis not present

## 2018-03-14 DIAGNOSIS — Z7401 Bed confinement status: Secondary | ICD-10-CM | POA: Diagnosis not present

## 2018-03-14 DIAGNOSIS — Z951 Presence of aortocoronary bypass graft: Secondary | ICD-10-CM | POA: Diagnosis not present

## 2018-03-14 DIAGNOSIS — I4891 Unspecified atrial fibrillation: Secondary | ICD-10-CM

## 2018-03-14 DIAGNOSIS — E039 Hypothyroidism, unspecified: Secondary | ICD-10-CM | POA: Diagnosis present

## 2018-03-14 DIAGNOSIS — I214 Non-ST elevation (NSTEMI) myocardial infarction: Secondary | ICD-10-CM

## 2018-03-14 DIAGNOSIS — I25810 Atherosclerosis of coronary artery bypass graft(s) without angina pectoris: Secondary | ICD-10-CM | POA: Diagnosis not present

## 2018-03-14 DIAGNOSIS — E785 Hyperlipidemia, unspecified: Secondary | ICD-10-CM | POA: Diagnosis not present

## 2018-03-14 DIAGNOSIS — D649 Anemia, unspecified: Secondary | ICD-10-CM | POA: Diagnosis present

## 2018-03-14 DIAGNOSIS — R05 Cough: Secondary | ICD-10-CM | POA: Diagnosis not present

## 2018-03-14 DIAGNOSIS — J9620 Acute and chronic respiratory failure, unspecified whether with hypoxia or hypercapnia: Secondary | ICD-10-CM | POA: Diagnosis not present

## 2018-03-14 DIAGNOSIS — D539 Nutritional anemia, unspecified: Secondary | ICD-10-CM | POA: Diagnosis present

## 2018-03-14 DIAGNOSIS — M2550 Pain in unspecified joint: Secondary | ICD-10-CM | POA: Diagnosis not present

## 2018-03-14 DIAGNOSIS — I6521 Occlusion and stenosis of right carotid artery: Secondary | ICD-10-CM | POA: Diagnosis present

## 2018-03-14 DIAGNOSIS — I2583 Coronary atherosclerosis due to lipid rich plaque: Secondary | ICD-10-CM | POA: Diagnosis not present

## 2018-03-14 DIAGNOSIS — Z8673 Personal history of transient ischemic attack (TIA), and cerebral infarction without residual deficits: Secondary | ICD-10-CM | POA: Diagnosis not present

## 2018-03-14 DIAGNOSIS — I10 Essential (primary) hypertension: Secondary | ICD-10-CM | POA: Diagnosis not present

## 2018-03-14 DIAGNOSIS — Z7982 Long term (current) use of aspirin: Secondary | ICD-10-CM | POA: Diagnosis not present

## 2018-03-14 DIAGNOSIS — I272 Pulmonary hypertension, unspecified: Secondary | ICD-10-CM | POA: Diagnosis present

## 2018-03-14 DIAGNOSIS — A419 Sepsis, unspecified organism: Principal | ICD-10-CM | POA: Diagnosis present

## 2018-03-14 DIAGNOSIS — N183 Chronic kidney disease, stage 3 unspecified: Secondary | ICD-10-CM | POA: Diagnosis present

## 2018-03-14 DIAGNOSIS — H109 Unspecified conjunctivitis: Secondary | ICD-10-CM | POA: Diagnosis present

## 2018-03-14 DIAGNOSIS — J9601 Acute respiratory failure with hypoxia: Secondary | ICD-10-CM | POA: Diagnosis not present

## 2018-03-14 DIAGNOSIS — E7800 Pure hypercholesterolemia, unspecified: Secondary | ICD-10-CM | POA: Diagnosis not present

## 2018-03-14 DIAGNOSIS — I130 Hypertensive heart and chronic kidney disease with heart failure and stage 1 through stage 4 chronic kidney disease, or unspecified chronic kidney disease: Secondary | ICD-10-CM | POA: Diagnosis present

## 2018-03-14 DIAGNOSIS — Z79899 Other long term (current) drug therapy: Secondary | ICD-10-CM

## 2018-03-14 DIAGNOSIS — R069 Unspecified abnormalities of breathing: Secondary | ICD-10-CM | POA: Diagnosis not present

## 2018-03-14 DIAGNOSIS — I480 Paroxysmal atrial fibrillation: Secondary | ICD-10-CM | POA: Diagnosis present

## 2018-03-14 DIAGNOSIS — R0602 Shortness of breath: Secondary | ICD-10-CM | POA: Diagnosis not present

## 2018-03-14 DIAGNOSIS — R531 Weakness: Secondary | ICD-10-CM

## 2018-03-14 DIAGNOSIS — Z8719 Personal history of other diseases of the digestive system: Secondary | ICD-10-CM

## 2018-03-14 DIAGNOSIS — J90 Pleural effusion, not elsewhere classified: Secondary | ICD-10-CM | POA: Diagnosis not present

## 2018-03-14 DIAGNOSIS — J690 Pneumonitis due to inhalation of food and vomit: Secondary | ICD-10-CM | POA: Diagnosis not present

## 2018-03-14 DIAGNOSIS — Z950 Presence of cardiac pacemaker: Secondary | ICD-10-CM | POA: Diagnosis present

## 2018-03-14 DIAGNOSIS — I5022 Chronic systolic (congestive) heart failure: Secondary | ICD-10-CM | POA: Diagnosis present

## 2018-03-14 DIAGNOSIS — Z09 Encounter for follow-up examination after completed treatment for conditions other than malignant neoplasm: Secondary | ICD-10-CM

## 2018-03-14 DIAGNOSIS — I739 Peripheral vascular disease, unspecified: Secondary | ICD-10-CM | POA: Diagnosis present

## 2018-03-14 HISTORY — DX: Occlusion and stenosis of right carotid artery: I65.21

## 2018-03-14 LAB — URINALYSIS, ROUTINE W REFLEX MICROSCOPIC
Glucose, UA: NEGATIVE mg/dL
Hgb urine dipstick: NEGATIVE
Ketones, ur: NEGATIVE mg/dL
LEUKOCYTES UA: NEGATIVE
NITRITE: NEGATIVE
PH: 5 (ref 5.0–8.0)
Protein, ur: NEGATIVE mg/dL
SPECIFIC GRAVITY, URINE: 1.025 (ref 1.005–1.030)

## 2018-03-14 LAB — RESPIRATORY PANEL BY PCR

## 2018-03-14 LAB — CBC WITH DIFFERENTIAL/PLATELET
BASOS PCT: 0 %
Basophils Absolute: 0 10*3/uL (ref 0.0–0.1)
EOS PCT: 0 %
Eosinophils Absolute: 0 10*3/uL (ref 0.0–0.7)
HEMATOCRIT: 29.7 % — AB (ref 39.0–52.0)
HEMOGLOBIN: 9.9 g/dL — AB (ref 13.0–17.0)
LYMPHS PCT: 15 %
Lymphs Abs: 0.4 10*3/uL — ABNORMAL LOW (ref 0.7–4.0)
MCH: 32.6 pg (ref 26.0–34.0)
MCHC: 33.3 g/dL (ref 30.0–36.0)
MCV: 97.7 fL (ref 78.0–100.0)
MONOS PCT: 14 %
Monocytes Absolute: 0.4 10*3/uL (ref 0.1–1.0)
NEUTROS ABS: 1.9 10*3/uL (ref 1.7–7.7)
NEUTROS PCT: 71 %
Platelets: 81 10*3/uL — ABNORMAL LOW (ref 150–400)
RBC: 3.04 MIL/uL — ABNORMAL LOW (ref 4.22–5.81)
RDW: 15.4 % (ref 11.5–15.5)
WBC MORPHOLOGY: INCREASED
WBC: 2.7 10*3/uL — ABNORMAL LOW (ref 4.0–10.5)

## 2018-03-14 LAB — I-STAT CG4 LACTIC ACID, ED
LACTIC ACID, VENOUS: 1.21 mmol/L (ref 0.5–1.9)
Lactic Acid, Venous: 1.24 mmol/L (ref 0.5–1.9)

## 2018-03-14 LAB — PROTIME-INR
INR: 1.4
Prothrombin Time: 17.1 seconds — ABNORMAL HIGH (ref 11.4–15.2)

## 2018-03-14 LAB — SEDIMENTATION RATE: Sed Rate: 64 mm/hr — ABNORMAL HIGH (ref 0–16)

## 2018-03-14 LAB — COMPREHENSIVE METABOLIC PANEL
ALBUMIN: 2.7 g/dL — AB (ref 3.5–5.0)
ALT: 24 U/L (ref 17–63)
ANION GAP: 10 (ref 5–15)
AST: 15 U/L (ref 15–41)
Alkaline Phosphatase: 47 U/L (ref 38–126)
BILIRUBIN TOTAL: 1.6 mg/dL — AB (ref 0.3–1.2)
BUN: 26 mg/dL — AB (ref 6–20)
CHLORIDE: 101 mmol/L (ref 101–111)
CO2: 23 mmol/L (ref 22–32)
Calcium: 8.1 mg/dL — ABNORMAL LOW (ref 8.9–10.3)
Creatinine, Ser: 1.72 mg/dL — ABNORMAL HIGH (ref 0.61–1.24)
GFR calc Af Amer: 38 mL/min — ABNORMAL LOW (ref >60)
GFR, EST NON AFRICAN AMERICAN: 33 mL/min — AB (ref >60)
GLUCOSE: 101 mg/dL — AB (ref 65–99)
POTASSIUM: 3.7 mmol/L (ref 3.5–5.1)
Sodium: 134 mmol/L — ABNORMAL LOW (ref 135–145)
TOTAL PROTEIN: 5.9 g/dL — AB (ref 6.5–8.1)

## 2018-03-14 LAB — INFLUENZA PANEL BY PCR (TYPE A & B)
INFLAPCR: NEGATIVE
INFLBPCR: NEGATIVE

## 2018-03-14 LAB — PROCALCITONIN: Procalcitonin: 4.48 ng/mL

## 2018-03-14 LAB — BRAIN NATRIURETIC PEPTIDE: B NATRIURETIC PEPTIDE 5: 500.7 pg/mL — AB (ref 0.0–100.0)

## 2018-03-14 MED ORDER — EZETIMIBE 10 MG PO TABS
10.0000 mg | ORAL_TABLET | Freq: Every day | ORAL | Status: DC
  Administered 2018-03-15 – 2018-03-19 (×5): 10 mg via ORAL
  Filled 2018-03-14 (×5): qty 1

## 2018-03-14 MED ORDER — FERROUS SULFATE 325 (65 FE) MG PO TABS: 325.0000 mg | ORAL_TABLET | Freq: Two times a day (BID) | ORAL | Status: DC

## 2018-03-14 MED ORDER — ONDANSETRON HCL 4 MG PO TABS: 4.0000 mg | ORAL_TABLET | Freq: Four times a day (QID) | ORAL | Status: DC | PRN

## 2018-03-14 MED ORDER — SODIUM CHLORIDE 0.9 % IV SOLN
1.0000 g | INTRAVENOUS | Status: DC
  Filled 2018-03-14: qty 1

## 2018-03-14 MED ORDER — ACETAMINOPHEN 650 MG RE SUPP: 650.0000 mg | Freq: Four times a day (QID) | RECTAL | Status: DC | PRN

## 2018-03-14 MED ORDER — IPRATROPIUM-ALBUTEROL 0.5-2.5 (3) MG/3ML IN SOLN
3.0000 mL | Freq: Four times a day (QID) | RESPIRATORY_TRACT | Status: DC
  Administered 2018-03-14 – 2018-03-15 (×5): 3 mL via RESPIRATORY_TRACT
  Filled 2018-03-14 (×5): qty 3

## 2018-03-14 MED ORDER — ONDANSETRON HCL 4 MG/2ML IJ SOLN
4.0000 mg | Freq: Four times a day (QID) | INTRAMUSCULAR | Status: DC | PRN
  Administered 2018-03-15: 4 mg via INTRAVENOUS
  Filled 2018-03-14: qty 2

## 2018-03-14 MED ORDER — SODIUM CHLORIDE 0.9 % IV BOLUS
1000.0000 mL | Freq: Once | INTRAVENOUS | Status: AC
  Administered 2018-03-14: 1000 mL via INTRAVENOUS

## 2018-03-14 MED ORDER — LEVALBUTEROL HCL 0.63 MG/3ML IN NEBU
0.6300 mg | INHALATION_SOLUTION | Freq: Three times a day (TID) | RESPIRATORY_TRACT | Status: DC
  Administered 2018-03-14: 0.63 mg via RESPIRATORY_TRACT

## 2018-03-14 MED ORDER — TOBRAMYCIN 0.3 % OP SOLN
2.0000 [drp] | Freq: Four times a day (QID) | OPHTHALMIC | Status: DC
  Administered 2018-03-14 – 2018-03-19 (×18): 2 [drp] via OPHTHALMIC
  Filled 2018-03-14: qty 5

## 2018-03-14 MED ORDER — CEFEPIME HCL 1 G IJ SOLR
1.0000 g | Freq: Once | INTRAMUSCULAR | Status: DC
  Filled 2018-03-14: qty 1

## 2018-03-14 MED ORDER — SODIUM CHLORIDE 0.9 % IV SOLN
2.0000 g | Freq: Once | INTRAVENOUS | Status: AC
  Administered 2018-03-14: 2 g via INTRAVENOUS
  Filled 2018-03-14: qty 2

## 2018-03-14 MED ORDER — SODIUM CHLORIDE 0.9 % IV SOLN: 2.0000 g | Freq: Once | INTRAVENOUS | Status: DC

## 2018-03-14 MED ORDER — METHYLPREDNISOLONE SODIUM SUCC 125 MG IJ SOLR
60.0000 mg | Freq: Two times a day (BID) | INTRAMUSCULAR | Status: DC
  Administered 2018-03-14 – 2018-03-16 (×4): 60 mg via INTRAVENOUS
  Filled 2018-03-14 (×4): qty 2

## 2018-03-14 MED ORDER — SODIUM CHLORIDE 0.9 % IV BOLUS (SEPSIS)
1000.0000 mL | Freq: Once | INTRAVENOUS | Status: AC
  Administered 2018-03-14: 1000 mL via INTRAVENOUS

## 2018-03-14 MED ORDER — SODIUM CHLORIDE 0.9 % IV SOLN: 1.0000 g | INTRAVENOUS | Status: DC

## 2018-03-14 MED ORDER — ENOXAPARIN SODIUM 40 MG/0.4ML ~~LOC~~ SOLN: 40.0000 mg | SUBCUTANEOUS | Status: DC

## 2018-03-14 MED ORDER — ASPIRIN EC 81 MG PO TBEC
81.0000 mg | DELAYED_RELEASE_TABLET | Freq: Every day | ORAL | Status: DC
  Administered 2018-03-15 – 2018-03-19 (×5): 81 mg via ORAL
  Filled 2018-03-14 (×7): qty 1

## 2018-03-14 MED ORDER — VANCOMYCIN HCL 10 G IV SOLR
1750.0000 mg | Freq: Once | INTRAVENOUS | Status: AC
  Administered 2018-03-14: 1750 mg via INTRAVENOUS
  Filled 2018-03-14: qty 1750

## 2018-03-14 MED ORDER — LATANOPROST 0.005 % OP SOLN
1.0000 [drp] | OPHTHALMIC | Status: DC
  Administered 2018-03-14 – 2018-03-17 (×2): 1 [drp] via OPHTHALMIC
  Filled 2018-03-14: qty 2.5

## 2018-03-14 MED ORDER — SODIUM CHLORIDE 0.9 % IV BOLUS (SEPSIS)
500.0000 mL | Freq: Once | INTRAVENOUS | Status: AC
  Administered 2018-03-14: 500 mL via INTRAVENOUS

## 2018-03-14 MED ORDER — ACETAMINOPHEN 325 MG PO TABS
650.0000 mg | ORAL_TABLET | Freq: Four times a day (QID) | ORAL | Status: DC | PRN
  Administered 2018-03-15: 650 mg via ORAL
  Filled 2018-03-14: qty 2

## 2018-03-14 MED ORDER — TAMSULOSIN HCL 0.4 MG PO CAPS
0.4000 mg | ORAL_CAPSULE | Freq: Every day | ORAL | Status: DC
  Administered 2018-03-15 – 2018-03-19 (×5): 0.4 mg via ORAL
  Filled 2018-03-14 (×5): qty 1

## 2018-03-14 MED ORDER — CLOPIDOGREL BISULFATE 75 MG PO TABS
75.0000 mg | ORAL_TABLET | Freq: Every day | ORAL | Status: DC
  Administered 2018-03-15 – 2018-03-18 (×4): 75 mg via ORAL
  Filled 2018-03-14 (×4): qty 1

## 2018-03-14 MED ORDER — LEVOTHYROXINE SODIUM 125 MCG PO TABS
125.0000 ug | ORAL_TABLET | Freq: Every day | ORAL | Status: DC
  Administered 2018-03-16 – 2018-03-17 (×2): 125 ug via ORAL
  Filled 2018-03-14 (×4): qty 1

## 2018-03-14 MED ORDER — RANOLAZINE ER 500 MG PO TB12
1000.0000 mg | ORAL_TABLET | Freq: Two times a day (BID) | ORAL | Status: DC
  Administered 2018-03-15 – 2018-03-19 (×9): 1000 mg via ORAL
  Filled 2018-03-14 (×9): qty 2

## 2018-03-14 MED ORDER — SODIUM CHLORIDE 0.9% FLUSH
3.0000 mL | Freq: Two times a day (BID) | INTRAVENOUS | Status: DC
  Administered 2018-03-14 – 2018-03-19 (×6): 3 mL via INTRAVENOUS

## 2018-03-14 MED ORDER — SODIUM CHLORIDE 0.9 % IV SOLN
INTRAVENOUS | Status: DC
  Administered 2018-03-14 – 2018-03-16 (×3): via INTRAVENOUS

## 2018-03-14 MED ORDER — ALBUTEROL SULFATE (2.5 MG/3ML) 0.083% IN NEBU
2.5000 mg | INHALATION_SOLUTION | RESPIRATORY_TRACT | Status: DC | PRN
  Administered 2018-03-17 – 2018-03-19 (×4): 2.5 mg via RESPIRATORY_TRACT
  Filled 2018-03-14 (×4): qty 3

## 2018-03-14 MED ORDER — ATORVASTATIN CALCIUM 40 MG PO TABS
40.0000 mg | ORAL_TABLET | Freq: Every day | ORAL | Status: DC
  Administered 2018-03-15 – 2018-03-19 (×5): 40 mg via ORAL
  Filled 2018-03-14 (×5): qty 1

## 2018-03-14 NOTE — ED Notes (Signed)
Patient is resting with call bell in reach  

## 2018-03-14 NOTE — ED Triage Notes (Addendum)
Per EMS- pt has hx of CHF. Pt has had about 2 weeks of cough, non productive/ Pt reports shortness of breath. Pt also endorses swelling and weight gain to legs and arms. Temp 101.2 with EMS, given 1 gram of tylenol by EMS. No IV access upon arrival. Pt also was treated for a UTI 2 weeks ago. EMS gave breathing txs 10 of albuterol.   Pt still endorses pain with urination. Cough is non productive. Tightness noted to arms ankles and feet. Condom cath placed on pt to obtain sample due to pt incontinence.

## 2018-03-14 NOTE — ED Provider Notes (Addendum)
[facility]ERGENC[facility] DEPARTMENT Provider Note   CSN: [id number] Arrival date & time: 03/14/18  1007     History   Chief Complaint Chief Complaint  Patient presents with  . Fever  . Congestive Heart Failure  . Cough  . Shortness of Breath    HPI PIERRE Peck is a 84 y.o. male.  HPI He presents for evaluation of cough, with swelling in his arms and legs.  Temperature elevated today at home, he did not know he had a fever.  Treated with Tylenol by EMS during transport.  Patient recently treated for UTI, as an outpatient.  Patient was treated with albuterol, nebulizer, during transport.  Patient complains of cough which is been productive for several weeks.  Appetite has been poor.  Patient has been having trouble getting around the home, but gets help from his family members.  Recent hospitalization, with subsequent follow-up at home with physical therapy, but did not see his PCP, yet.  Patient has been found to have a carotid obstruction requiring endarterectomy but has been felt to be too weak for that.  There are no other known modifying factors.   Past Medical History:  Diagnosis Date  . Anemia   . Arthritis   . CAD (coronary artery disease)    a. CABG '89; b. PCI '04; c. 11/2015 Cath/PCI: LM 20ost, LAD 143m, LCX 30p, OM2 40, RCA 30p, 10p ISR, 90d (3.0x12 Synergy DES), AM 90, VG->OM2 known to be 100, LIMA->LAD not injected, patent in 2015.  Marland Kitchen Chronic combined systolic and diastolic CHF (congestive heart failure) ([place])   . CKD (chronic kidney disease), stage III ([place])   . H/O: GI bleed    REMOTE HISTORY  . History of COPD   . Hyperlipidemia   . Hypertensive heart disease   . LBBB (left bundle branch block)   . Pacemaker Sept 2009   St Jude  . PAF (paroxysmal atrial fibrillation) (HCC)    a. not on anticoag due to prior GIB.  . Sick sinus syndrome [facility]) Sept 2009   ST Jude PTVDP    Patient Active Problem List   Diagnosis Date Noted  . Facial droop  02/22/2018  . COPD with acute exacerbation ([place]) 02/22/2018  . Thrombocytopenia ([place]) 02/22/2018  . Generalized weakness   . CAD (coronary artery disease)   . Hypertensive heart disease   . Acute renal failure superimposed on stage 3 chronic kidney disease ([place])   . Hyperlipidemia   . Stented coronary artery   . Coronary artery disease involving native coronary artery of native heart with unstable angina pectoris ([place])   . Chest pain 11/27/2015  . Reactive airway disease 11/27/2015  . Angina pectoris ([place]) 11/27/2015  . CAD in native artery 09/03/2015  . Hyperkalemia 05/09/2015  . Weakness 05/09/2015  . Acute on chronic kidney failure ([place]) 05/09/2015  . Chronic diastolic CHF (congestive heart failure) ([place]) 05/09/2015  . Hyperlipidemia LDL goal <70 02/28/2015  . Atherosclerosis of lower extremity with claudication ([place]) 07/17/2014  . Unstable angina ([place]) 01/23/2014  . PAF (paroxysmal atrial fibrillation) ([place]) 03/24/2013  . Lower extremity edema 03/24/2013  . Sinoatrial node dysfunction ([place]) 08/17/2012  . History of iron deficiency anemia 09/18/2011  . Essential hypertension 07/07/2009  . INTERMEDIATE CORONARY SYNDROME 07/07/2009  . S/P CABG x 2 '89. RCA stent'04. last cath Jan 2013 07/07/2009  . PACEMAKER, PERMANENT- St Jude Sept 2009 07/07/2009  . Sick sinus syndrome ([place]) 07/10/2008    Past Surgical History:  Procedure Laterality Date  . CARDIAC CATHETERIZATION  11/2011   EF 50%; significant native CAD w/80% stenosis of the LAD after 2nd giagonal vessel and septal perforating artery w/total occlusion of the mid left anterior descendiung.; 60-70% ostiasl stenosis in the circumflex vessel followed by 40% proximal stenosis and 70-80% distal circumflex stenosis;   . CARDIAC CATHETERIZATION N/A 11/29/2015   Procedure: Left Heart Cath and Cors/Grafts Angiography;  Surgeon: Burnell Blanks, MD;  Location: [facility] CV [facility];  Service: Cardiovascular;  Laterality: N/A;  .  CARDIAC CATHETERIZATION N/A 11/29/2015   Procedure: Coronary Stent Intervention;  Surgeon: Burnell Blanks, MD;  Location: [facility];  Service: Cardiovascular;  Laterality: N/A;  . CATARACT EXTRACTION  2004  . CORONARY ANGIOGRAPHY N/A 02/25/2017   Procedure: Coronary Angiography;  Surgeon: Belva Crome, MD;  Location: [facility];  Service: Cardiovascular;  Laterality: N/A;  . CORONARY ANGIOPLASTY WITH STENT PLACEMENT  2004   RCA  . CORONARY ARTERY BYPASS GRAFT  1989   had LIMA to his LAD, a vein to the circumflex. In 2004 underwent stenting to his right coronary artery.  . CORONARY STENT INTERVENTION N/A 02/24/2017   Procedure: Coronary Stent Intervention;  Surgeon: Wellington Hampshire, MD;  Location: [facility];  Service: Cardiovascular;  Laterality: N/A;  cfx  . HEMORRHOID SURGERY    . INSERT / REPLACE / REMOVE PACEMAKER  07/17/08   DUAL-CHAMBER; PPM-ST.JUDE MEDNET  . LEFT HEART CATH AND CORS/GRAFTS ANGIOGRAPHY N/A 02/24/2017   Procedure: Left Heart Cath and Cors/Grafts Angiography;  Surgeon: Wellington Hampshire, MD;  Location: [facility] LAB;  Service: Cardiovascular;  Laterality: N/A;  . LEFT HEART CATHETERIZATION WITH CORONARY/GRAFT ANGIOGRAM N/A 12/10/2011   Procedure: LEFT HEART CATHETERIZATION WITH Beatrix Fetters;  Surgeon: Troy Sine, MD;  Location: [facility] CATH LAB;  Service: Cardiovascular;  Laterality: N/A;  . LEFT HEART CATHETERIZATION WITH CORONARY/GRAFT ANGIOGRAM N/A 01/26/2014   Procedure: LEFT HEART CATHETERIZATION WITH Beatrix Fetters;  Surgeon: Troy Sine, MD;  Location: [facility] CATH LAB;  Service: Cardiovascular;  Laterality: N/A;  . PPM GENERATOR CHANGEOUT N/A 12/15/2017   Procedure: PPM GENERATOR CHANGEOUT;  Surgeon: Evans Lance, MD;  Location: [facility] CV LAB;  Service: Cardiovascular;  Laterality: N/A;  . PROSTATECTOMY          Home Medications    Prior to Admission medications   Medication Sig Start Date End Date  Taking? Authorizing Provider  albuterol (PROVENTIL HFA;VENTOLIN HFA) 108 (90 Base) MCG/ACT inhaler Inhale 2 puffs into the lungs every 4 (four) hours as needed for wheezing or shortness of breath (or coughing). Patient taking differently: Inhale 1 puff into the lungs every 4 (four) hours as needed for wheezing or shortness of breath (or coughing).  01/28/01  Yes Delora Fuel, MD  aspirin 81 MG tablet Take 81 mg by mouth daily.    Yes [provider]  atorvastatin (LIPITOR) 40 MG tablet Take 1 tablet (40 mg total) by mouth daily. 09/18/15  Yes Troy Sine, MD  carvedilol (COREG) 25 MG tablet Take 25 mg by mouth 2 (two) times daily with a meal.     Yes [provider]  cholecalciferol (VITAMIN D) 1000 units tablet Take 1,000 Units by mouth daily.   Yes [provider]  clopidogrel (PLAVIX) 75 MG tablet Take 75 mg by mouth daily at 3 pm.    Yes [provider]  ezetimibe (ZETIA) 10 MG tablet Take 10 mg by mouth  daily.   Yes [provider]  ferrous sulfate 325 (65 FE) MG tablet Take 325 mg by mouth 2 (two) times daily with a meal.    Yes [provider]  furosemide (LASIX) 20 MG tablet Take 0.5 tablets (10 mg total) by mouth daily. Patient taking differently: Take 10 mg by mouth every other day.  02/24/18  Yes Mikhail, Velta Addison, DO  guaiFENesin (ROBITUSSIN) 100 MG/5ML SOLN Take 5 mLs by mouth every 4 (four) hours as needed for cough or to loosen phlegm.   Yes [provider]  ipratropium-albuterol (DUONEB) 0.5-2.5 (3) MG/3ML SOLN Take 3 mLs by nebulization 2 (two) times daily. 02/24/18  Yes Mikhail, Velta Addison, DO  isosorbide mononitrate (IMDUR) 60 MG 24 hr tablet Take 90mg  (1.5 tablets) in the AM and 30mg  (1/2 tablet) in the PM Patient taking differently: Take 30-90 mg by mouth See admin instructions. Take 90mg  (1.5 tablets) in the AM and 30mg  (1/2 tablet) in the PM 10/26/17  Yes Troy Sine, MD  latanoprost (XALATAN) 0.005 % ophthalmic  solution Place 1 drop into both eyes 2 (two) times a week.  11/07/15  Yes [provider]  levothyroxine (SYNTHROID, LEVOTHROID) 125 MCG tablet TAKE 1 TABLET BY MOUTH EVERY DAY BEFORE BREAKFAST Patient taking differently: TAKE 125 MCG BY MOUTH EVERY DAY BEFORE BREAKFAST 03/04/17  Yes Troy Sine, MD  Menthol, Topical Analgesic, (ICY HOT EX) Apply 1 application topically daily as needed (aches and pains).   Yes [provider]  nitroGLYCERIN (NITROSTAT) 0.4 MG SL tablet PLACE 1 TABLET UNDER THE TONGUE AS NEEDED. Patient taking differently: PLACE 0.4 MG UNDER THE TONGUE AS NEEDED FOR CHEST PAIN. 07/06/16  Yes Troy Sine, MD  OVER THE COUNTER MEDICATION Take 1 capsule by mouth daily as needed ("Prune-Lax" for constipation).   Yes [provider]  pantoprazole (PROTONIX) 40 MG tablet Take 40 mg by mouth 2 (two) times daily.   Yes [provider]  ranolazine (RANEXA) 1000 MG SR tablet Take 1 tablet (1,000 mg total) by mouth 2 (two) times daily. 11/27/15  Yes Troy Sine, MD  tamsulosin (FLOMAX) 0.4 MG CAPS capsule Take 0.4 mg by mouth daily. 02/26/15  Yes [provider]  docusate sodium (COLACE) 100 MG capsule Take 1 capsule (100 mg total) by mouth daily as needed for mild constipation. Patient not taking: Reported on 03/14/2018 02/26/17   Leanor Kail, PA  predniSONE (DELTASONE) 20 MG tablet Take with breakast: Take 3 tablets x 2 days, then 2 tablets x 3 days, then 1 tablet x 3 days Patient not taking: Reported on 03/14/2018 02/24/18   Cristal Ford, DO    Family History Family History  Problem Relation Age of Onset  . Diabetes Father   . Heart disease Sister   . Heart disease Brother   . Heart attack Brother   . Stroke Brother   . Hypertension Neg Hx     Social History Social History   Tobacco Use  . Smoking status: Former Smoker    Packs/day: 2.00    Years: 65.00    Pack years: 130.00    Types: Cigarettes  . Smokeless  tobacco: Former Systems developer    Quit date: 10/29/2008  Substance Use Topics  . Alcohol use: No  . Drug use: No     Allergies   Patient has no known allergies.   Review of Systems Review of Systems  All other systems reviewed and are negative.    Physical Exam Updated Vital  Signs BP 110/63   Pulse (!) 58   Temp 100.3 F (37.9 C)   Resp (!) 29   Ht 5\' 9"  (1.753 m)   Wt 76.2 kg (168 lb)   SpO2 97%   BMI 24.81 kg/m   Physical Exam  Constitutional: He appears well-developed. He appears ill.  Elderly, frail  HENT:  Head: Normocephalic and atraumatic.  Right Ear: External ear normal.  Left Ear: External ear normal.  Eyes: Pupils are equal, round, and reactive to light. Conjunctivae and EOM are normal.  Neck: Normal range of motion and phonation normal. Neck supple.  Cardiovascular: Normal rate, regular rhythm and normal heart sounds.  Pulmonary/Chest: Effort normal. No respiratory distress. He exhibits no tenderness and no bony tenderness.  Decreased air movement bilaterally with generalized rhonchi and few rales.  No audible wheezing.  Abdominal: Soft. There is no tenderness.  Musculoskeletal: Normal range of motion. He exhibits edema (2+ bilateral lower legs).  Neurological: He is alert. No cranial nerve deficit or sensory deficit. He exhibits normal muscle tone. Coordination normal.  Skin: Skin is warm, dry and intact.  Psychiatric: He has a normal mood and affect. His behavior is normal.  Nursing note and vitals reviewed.    ED Treatments / Results  Labs (all labs ordered are listed, but only abnormal results are displayed) Labs Reviewed  CBC WITH DIFFERENTIAL/PLATELET - Abnormal; Notable for the following components:      Result Value   WBC 2.7 (*)    RBC 3.04 (*)    Hemoglobin 9.9 (*)    HCT 29.7 (*)    Platelets 81 (*)    Lymphs Abs 0.4 (*)    All other components within normal limits  PROTIME-INR - Abnormal; Notable for the following components:    Prothrombin Time 17.1 (*)    All other components within normal limits  BRAIN NATRIURETIC PEPTIDE - Abnormal; Notable for the following components:   B Natriuretic Peptide 500.7 (*)    All other components within normal limits  COMPREHENSIVE METABOLIC PANEL - Abnormal; Notable for the following components:   Sodium 134 (*)    Glucose, Bld 101 (*)    BUN 26 (*)    Creatinine, Ser 1.72 (*)    Calcium 8.1 (*)    Total Protein 5.9 (*)    Albumin 2.7 (*)    Total Bilirubin 1.6 (*)    GFR calc non Af Amer 33 (*)    GFR calc Af Amer 38 (*)    All other components within normal limits  CULTURE, BLOOD (ROUTINE X 2)  CULTURE, BLOOD (ROUTINE X 2)  URINE CULTURE  INFLUENZA PANEL BY PCR (TYPE A & B)  URINALYSIS, ROUTINE W REFLEX MICROSCOPIC  I-STAT CG4 LACTIC ACID, ED  I-STAT CG4 LACTIC ACID, ED    EKG EKG Interpretation  Date/Time:  Monday Mar 14 2018 10:08:05 EDT Ventricular Rate:  67 PR Interval:    QRS Duration: 142 QT Interval:  420 QTC Calculation: 444 R Axis:   -31 Text Interpretation:  Ventricular-paced rhythm No further analysis attempted due to paced rhythm since last tracing no significant change Confirmed by Daleen Bo [phone]) on 03/14/2018 12:29:27 PM   Radiology Dg Chest Portable 1 View  Result Date: 03/14/2018 CLINICAL DATA:  History of CHF.  Cough. EXAM: PORTABLE CHEST 1 VIEW COMPARISON:  February 22, 2018 FINDINGS: Stable cardiomegaly. Mild bilateral pulmonary opacities are somewhat patchy in appearance but more focal in the right suprahilar region and left base. The hila  and mediastinum are stable. No pneumothorax. No other acute abnormalities. IMPRESSION: 1. Bilateral pulmonary opacities are somewhat patchy in appearance, more focal in the right suprahilar region and left base. While asymmetric edema is possible, the patchy nature suggests the possibility of multifocal pneumonia. Recommend clinical correlation and follow-up to resolution. Electronically Signed   By:  Dorise Bullion III M.D   On: 03/14/2018 10:39    Procedures .Critical Care Performed by: Daleen Bo, MD Authorized by: Daleen Bo, MD   Critical care provider statement:    Critical care time (minutes):  45   Critical care start time:  03/14/2018 12:40 PM   Critical care end time:  03/14/2018 2:30 PM   Critical care time was exclusive of:  Separately billable procedures and treating other patients   Critical care was necessary to treat or prevent imminent or life-threatening deterioration of the following conditions:  Sepsis   Critical care was time spent personally by me on the following activities:  Blood draw for specimens, development of treatment plan with patient or surrogate, discussions with consultants, evaluation of patient's response to treatment, examination of patient, obtaining history from patient or surrogate, ordering and performing treatments and interventions, ordering and review of laboratory studies, pulse oximetry, re-evaluation of patient's condition, review of old charts and ordering and review of radiographic studies   (including critical care time)  Medications Ordered in ED Medications  vancomycin (VANCOCIN) 1,750 mg in sodium chloride 0.9 % 500 mL IVPB (1,750 mg Intravenous New Bag/Given 03/14/18 1320)  ceFEPIme (MAXIPIME) 1 g in sodium chloride 0.9 % 100 mL IVPB (has no administration in time range)  sodium chloride 0.9 % bolus 1,000 mL (1,000 mLs Intravenous New Bag/Given 03/14/18 1203)  sodium chloride 0.9 % bolus 1,000 mL (0 mLs Intravenous Stopped 03/14/18 1320)    And  sodium chloride 0.9 % bolus 500 mL (0 mLs Intravenous Stopped 03/14/18 1255)  ceFEPIme (MAXIPIME) 2 g in sodium chloride 0.9 % 100 mL IVPB (0 g Intravenous Stopped 03/14/18 1421)     Initial Impression / Assessment and Plan / ED Course  I have reviewed the triage vital signs and the nursing notes.  Pertinent labs & imaging results that were available during my care of the patient were  reviewed by me and considered in my medical decision making (see chart for details).  Clinical Course as of Mar 15 1431  Mon Mar 14, 2018  1430 Normal  I-Stat CG4 Lactic Acid, ED [EW]  1430 normal  Influenza panel by PCR (type A & B) [EW]  1431 Elevated, but near baseline  Brain natriuretic peptide(!) [EW]  1431 Abnormal, low WBC and hemoglobin.  CBC with Differential(!) [EW]  1431 Abnormal, low sodium, calcium, protein and albumin.  Also elevated glucose, BUN, creatinine, and total bilirubin.  Comprehensive metabolic panel(!) [EW]    Clinical Course User Index [EW] Daleen Bo, MD     Patient Vitals for the past 24 hrs:  BP Temp Temp src Pulse Resp SpO2 Height Weight  03/14/18 1415 110/63 - - (!) 58 (!) 29 97 % - -  03/14/18 1330 (!) 110/56 - - 89 (!) 22 96 % - -  03/14/18 1300 118/66 - - (!) 110 (!) 25 97 % - -  03/14/18 1245 114/77 - - (!) 57 (!) 28 98 % - -  03/14/18 1211 - - - - - - 5\' 9"  (1.753 m) 76.2 kg (168 lb)  03/14/18 1200 108/68 - - (!) 58 (!) 26 97 % - -  03/14/18 1145 (!) 108/53 - - (!) 59 (!) 25 98 % - -  03/14/18 1115 (!) 112/57 - - 60 - 98 % - -  03/14/18 1100 (!) 117/58 - - 62 20 99 % - -  03/14/18 1045 (!) 106/59 - - 62 (!) 22 97 % - -  03/14/18 1009 (!) 113/53 100.3 F (37.9 C) - 63 (!) 23 97 % - -  03/14/18 1008 - 100.3 F (37.9 C) Oral - - - - -    2:28 PM Reevaluation with update and discussion. After initial assessment and treatment, an updated evaluation reveals no change in clinical status.  He remains fairly comfortable.  Oxygen saturation normal 97% on room air.  Patient family members updated on findings and plan.Daleen Bo    Medical decision making-pneumonia, with fever, post recent hospitalization for COPD.  Patient generally debilitated, and appears to require further support, with home health care and physical therapy.  Patient may be a candidate for rehab post hospitalization this time.   Nursing Notes Reviewed/ Care  Coordinated Applicable Imaging Reviewed Interpretation of Laboratory Data incorporated into ED treatment  2:33 PM-Consult complete with hospitalist. Patient case explained and discussed. She agrees to admit patient for further evaluation and treatment. Call ended at 2:45 PM  Plan: Admit   Final Clinical Impressions(s) / ED Diagnoses   Final diagnoses:  Community acquired pneumonia, unspecified laterality  Weakness    ED Discharge Orders    None       Daleen Bo, MD 03/14/18 1512    Daleen Bo, MD 03/22/18 1012

## 2018-03-14 NOTE — ED Notes (Signed)
Attempted to give reportx1 

## 2018-03-14 NOTE — H&P (Signed)
History and Physical    John Peck WUJ:[id number] DOB: 02/01/1924 DOA: 03/14/2018  **Will admit patient based on the expectation that the patient will need hospitalization/ hospital care that crosses at least 2 midnights  PCP: Seward Carol, MD   Attending physician: Lorin Mercy  Patient coming from/Resides with: Private residence/family  Chief Complaint: Cough, shortness of breath, fever and generalized weakness  HPI: John Peck is a 84 y.o. male with medical history significant for symptomatic right carotid stenosis discharged after recent admission on 4/18, PAF not on anticoagulation, COPD, CAD, chronic kidney disease stage III, chronic combined systolic and diastolic heart failure, iron deficiency anemia, and dyslipidemia.  Patient was recently discharged as stated above after TIA symptoms found to be related to symptomatic carotid stenosis.  He has subsequently followed up with vascular surgery on 4/29 with recommendations to consider stent placement.  At the time of evaluation patient was experiencing generalized weakness and felt he needed to recover from previous hospitalization before pursuing treatment.  It is reported that in the past 2 weeks he has also been treated for a UTI.  Of note he has had increasing drainage and redness in the eyes and has been started on Xalatan ophthalmological solution.  Patient reports he has had URI symptoms consistent with itching and of eyes, significant rhinorrhea, congested nonproductive cough and shortness of breath.  In the past 24 hours he is developed fever.  Because of worsening of symptoms and significant weakness EMS was called to the home.  They reported an oral temperature of 101.2 F.  The ER patient had a relative hypotension with systolic blood pressures in the low 100s.  He has had intermittent tachycardia and tachypnea.  Lactate was normal.  Room air saturations were not available but he was placed on 3 L O2 prior to arrival and has  maintained saturations of 97 and 99%.  Chest x-ray revealed bilateral pulmonary opacity patchy in appearance more focal in the right suprahilar and left base more consistent with multifocal pneumonia.  Had borderline neutropenia with a white count of 2700, neutrophils 71% and absolute neutrophils 1.9%.  Patient has slightly lower than normal thrombocytopenia.  Influenza a/B PCR negative.  Blood cultures been obtained in the ER.  This and urine culture are pending collection.  ED Course:  Vital Signs: BP 115/74   Pulse 60   Temp 100.3 F (37.9 C)   Resp (!) 28   Ht '5\' 9"'  (1.753 m)   Wt 76.2 kg (168 lb)   SpO2 95%   BMI 24.81 kg/m  Chest x-ray: As above Lab data: As above; BUN 26, creatinine 1.72, sodium 134, potassium 3.7, LFTs are normal, albumin 2.7, hemoglobin 9.9, platelets 81,000 Medications and treatments: NS bolus x2500 cc, vancomycin 1.75 g IV x1, Maxipime 2 g IV x1  Review of Systems:  In addition to the HPI above,  No Headache, changes with Vision or hearing, new weakness, tingling, numbness in any extremity, dizziness, dysarthria or word finding difficulty, tremors or seizure activity No problems swallowing food or Liquids, indigestion/reflux, choking or coughing while eating, abdominal pain with or after eating No Chest pain, palpitations, orthopnea or DOE No Abdominal pain, N/V, melena,hematochezia, dark tarry stools, constipation No dysuria, malodorous urine, hematuria or flank pain No new skin rashes, lesions, masses or bruises, No new joint pains, aches, swelling or redness No recent unintentional weight gain or loss No polyuria, polydypsia or polyphagia   Past Medical History:  Diagnosis Date  . Anemia   .  Arthritis   . CAD (coronary artery disease)    a. CABG '89; b. PCI '04; c. 11/2015 Cath/PCI: LM 20ost, LAD 144m LCX 30p, OM2 40, RCA 30p, 10p ISR, 90d (3.0x12 Synergy DES), AM 90, VG->OM2 known to be 100, LIMA->LAD not injected, patent in 2015.  . Carotid  stenosis, right   . Chronic combined systolic and diastolic CHF (congestive heart failure) ([facility]   . CKD (chronic kidney disease), stage III ([facility]   . H/O: GI bleed    REMOTE HISTORY  . History of COPD   . Hyperlipidemia   . Hypertensive heart disease   . LBBB (left bundle branch block)   . Pacemaker Sept 2009   St Jude  . PAF (paroxysmal atrial fibrillation) (HCC)    a. not on anticoag due to prior GIB.  . Sick sinus syndrome ([facility] Sept 2009   ST Jude PTVDP    Past Surgical History:  Procedure Laterality Date  . CARDIAC CATHETERIZATION  11/2011   EF 50%; significant native CAD w/80% stenosis of the LAD after 2nd giagonal vessel and septal perforating artery w/total occlusion of the mid left anterior descendiung.; 60-70% ostiasl stenosis in the circumflex vessel followed by 40% proximal stenosis and 70-80% distal circumflex stenosis;   . CARDIAC CATHETERIZATION N/A 11/29/2015   Procedure: Left Heart Cath and Cors/Grafts Angiography;  Surgeon: CBurnell Blanks MD;  [facility];  Service: Cardiovascular;  Laterality: N/A;  . CARDIAC CATHETERIZATION N/A 11/29/2015   Procedure: Coronary Stent Intervention;  Surgeon: CBurnell Blanks MD;  [facility];  Service: Cardiovascular;  Laterality: N/A;  . CATARACT EXTRACTION  2004  . CORONARY ANGIOGRAPHY N/A 02/25/2017   Procedure: Coronary Angiography;  Surgeon: HBelva Crome MD;  [facility];  Service: Cardiovascular;  Laterality: N/A;  . CORONARY ANGIOPLASTY WITH STENT PLACEMENT  2004   RCA  . CORONARY ARTERY BYPASS GRAFT  1989   had LIMA to his LAD, a vein to the circumflex. In 2004 underwent stenting to his right coronary artery.  . CORONARY STENT INTERVENTION N/A 02/24/2017   Procedure: Coronary Stent Intervention;  Surgeon: MWellington Hampshire MD;  [facility];  Service: Cardiovascular;  Laterality: N/A;  cfx  . HEMORRHOID SURGERY    . INSERT / REPLACE / REMOVE  PACEMAKER  07/17/08   DUAL-CHAMBER; PPM-ST.JUDE MEDNET  . LEFT HEART CATH AND CORS/GRAFTS ANGIOGRAPHY N/A 02/24/2017   Procedure: Left Heart Cath and Cors/Grafts Angiography;  Surgeon: MWellington Hampshire MD;  [facility];  Service: Cardiovascular;  Laterality: N/A;  . LEFT HEART CATHETERIZATION WITH CORONARY/GRAFT ANGIOGRAM N/A 12/10/2011   Procedure: LEFT HEART CATHETERIZATION WITH CBeatrix Fetters  Surgeon: TTroy Sine MD;  [facility];  Service: Cardiovascular;  Laterality: N/A;  . LEFT HEART CATHETERIZATION WITH CORONARY/GRAFT ANGIOGRAM N/A 01/26/2014   Procedure: LEFT HEART CATHETERIZATION WITH CBeatrix Fetters  Surgeon: TTroy Sine MD;  [facility];  Service: Cardiovascular;  Laterality: N/A;  . PPM GENERATOR CHANGEOUT N/A 12/15/2017   Procedure: PPM GENERATOR CHANGEOUT;  Surgeon: TEvans Lance MD;  [facility];  Service: Cardiovascular;  Laterality: N/A;  . PROSTATECTOMY      Social History   Socioeconomic History  . Marital status: Married    Spouse name: Not on file  . Number of children: 5  . Years of education: Not on file  . Highest education level: Not on file  Occupational  History  . Occupation: RETIRED    Employer: RETIRED    Comment: [employer]  . Financial resource strain: Not on file  . Food insecurity:    Worry: Not on file    Inability: Not on file  . Transportation needs:    Medical: Not on file    Non-medical: Not on file  Tobacco Use  . Smoking status: Former Smoker    Packs/day: 2.00    Years: 65.00    Pack years: 130.00    Types: Cigarettes  . Smokeless tobacco: Former Systems developer    Quit date: 10/29/2008  Substance and Sexual Activity  . Alcohol use: No  . Drug use: No  . Sexual activity: Not on file  Lifestyle  . Physical activity:    Days per week: Not on file    Minutes per session: Not on file  . Stress: Not on file  Relationships  . Social connections:      Talks on phone: Not on file    Gets together: Not on file    Attends religious service: Not on file    Active member of club or organization: Not on file    Attends meetings of clubs or organizations: Not on file    Relationship status: Not on file  . Intimate partner violence:    Fear of current or ex partner: Not on file    Emotionally abused: Not on file    Physically abused: Not on file    Forced sexual activity: Not on file  Other Topics Concern  . Not on file  Social History Narrative  . Not on file    Mobility: Utilizes both a cane and a rolling walker Work history: Not obtained   No Known Allergies  Family History  Problem Relation Age of Onset  . Diabetes Father   . Heart disease Sister   . Heart disease Brother   . Heart attack Brother   . Stroke Brother   . Hypertension Neg Hx      Prior to Admission medications   Medication Sig Start Date End Date Taking? Authorizing Provider  albuterol (PROVENTIL HFA;VENTOLIN HFA) 108 (90 Base) MCG/ACT inhaler Inhale 2 puffs into the lungs every 4 (four) hours as needed for wheezing or shortness of breath (or coughing). Patient taking differently: Inhale 1 puff into the lungs every 4 (four) hours as needed for wheezing or shortness of breath (or coughing).  07/30/18  Yes Delora Fuel, MD  aspirin 81 MG tablet Take 81 mg by mouth daily.    Yes [provider]  atorvastatin (LIPITOR) 40 MG tablet Take 1 tablet (40 mg total) by mouth daily. 09/18/15  Yes Troy Sine, MD  carvedilol (COREG) 25 MG tablet Take 25 mg by mouth 2 (two) times daily with a meal.     Yes [provider]  cholecalciferol (VITAMIN D) 1000 units tablet Take 1,000 Units by mouth daily.   Yes [provider]  clopidogrel (PLAVIX) 75 MG tablet Take 75 mg by mouth daily at 3 pm.    Yes [provider]  ezetimibe (ZETIA) 10 MG tablet Take 10 mg by mouth daily.   Yes [provider]  ferrous sulfate 325 (65 FE) MG  tablet Take 325 mg by mouth 2 (two) times daily with a meal.    Yes [provider]  furosemide (LASIX) 20 MG tablet Take 0.5 tablets (10 mg total) by mouth daily. Patient taking differently: Take 10 mg  by mouth every other day.  02/24/18  Yes Mikhail, Velta Addison, DO  guaiFENesin (ROBITUSSIN) 100 MG/5ML SOLN Take 5 mLs by mouth every 4 (four) hours as needed for cough or to loosen phlegm.   Yes [provider]  ipratropium-albuterol (DUONEB) 0.5-2.5 (3) MG/3ML SOLN Take 3 mLs by nebulization 2 (two) times daily. 02/24/18  Yes Mikhail, Velta Addison, DO  isosorbide mononitrate (IMDUR) 60 MG 24 hr tablet Take 84m (1.5 tablets) in the AM and 359m(1/2 tablet) in the PM Patient taking differently: Take 30-90 mg by mouth See admin instructions. Take 9076m1.5 tablets) in the AM and 18m13m/2 tablet) in the PM 10/26/17  Yes KellTroy Sine  latanoprost (XALATAN) 0.005 % ophthalmic solution Place 1 drop into both eyes 2 (two) times a week.  11/07/15  Yes [provider]  levothyroxine (SYNTHROID, LEVOTHROID) 125 MCG tablet TAKE 1 TABLET BY MOUTH EVERY DAY BEFORE BREAKFAST Patient taking differently: TAKE 125 MCG BY MOUTH EVERY DAY BEFORE BREAKFAST 03/04/17  Yes KellTroy Sine  Menthol, Topical Analgesic, (ICY HOT EX) Apply 1 application topically daily as needed (aches and pains).   Yes [provider]  nitroGLYCERIN (NITROSTAT) 0.4 MG SL tablet PLACE 1 TABLET UNDER THE TONGUE AS NEEDED. Patient taking differently: PLACE 0.4 MG UNDER THE TONGUE AS NEEDED FOR CHEST PAIN. 07/06/16  Yes KellTroy Sine  OVER THE COUNTER MEDICATION Take 1 capsule by mouth daily as needed ("Prune-Lax" for constipation).   Yes [provider]  pantoprazole (PROTONIX) 40 MG tablet Take 40 mg by mouth 2 (two) times daily.   Yes [provider]  ranolazine (RANEXA) 1000 MG SR tablet Take 1 tablet (1,000 mg total) by mouth 2 (two) times daily. 11/27/15  Yes KellTroy Sine    tamsulosin (FLOMAX) 0.4 MG CAPS capsule Take 0.4 mg by mouth daily. 02/26/15  Yes [provider]  docusate sodium (COLACE) 100 MG capsule Take 1 capsule (100 mg total) by mouth daily as needed for mild constipation. Patient not taking: Reported on 03/14/2018 02/26/17   BhagLeanor Kail  predniSONE (DELTASONE) 20 MG tablet Take with breakast: Take 3 tablets x 2 days, then 2 tablets x 3 days, then 1 tablet x 3 days Patient not taking: Reported on 03/14/2018 02/24/18   MikhCristal Ford    Physical Exam: Vitals:   03/14/18 1330 03/14/18 1415 03/14/18 1430 03/14/18 1500  BP: (!) 110/56 110/63 113/73 115/74  Pulse: 89 (!) 58 (!) 106 60  Resp: (!) 22 (!) 29 (!) 25 (!) 28  Temp:      TempSrc:      SpO2: 96% 97% 95% 95%  Weight:      Height:          Constitutional: Mild distress as evidenced by ongoing upper respiratory congestion and coughing Eyes: PERRL, lateral scleral injection with bilateral thick yellow drainage ENMT: Mucous membranes are dry. Posterior pharynx clear of any exudate or lesions overall mucosa quite erythematous.significant upper airway/posterior pharynx congestion noted on auscultation. Neck: normal, supple, no masses, no thyromegaly Respiratory: To auscultation with bilateral expiratory rhonchi, no wheezing.  Pursed lip/subtle blowing respiratory effort without accessory muscle use.  Tachypnea.  Liters oxygen Cardiovascular: Regular rate 100% ventricular paced rhythm, no murmurs / rubs / gallops.  Trace bilateral lower extremity edema. 2+ pedal pulses. No carotid bruits.  Abdomen: no tenderness, no masses palpated. No hepatosplenomegaly. Bowel sounds positive.  Genitourinary: Condom catheter in place with patient draining dark amber-colored  urine to bedside bag Musculoskeletal: no clubbing / cyanosis. No joint deformity upper and lower extremities. Good ROM, no contractures. Normal muscle tone.  Skin: no rashes, lesions, ulcers. No induration Neurologic:  CN 2-12 grossly intact. Sensation intact, DTR normal. Strength 5/5 x all 4 extremities.  Psychiatric: Normal judgment and insight. Alert and oriented x 3. Normal mood.    Labs on Admission: I have personally reviewed following labs and imaging studies  CBC: Recent Labs  Lab 03/14/18 1023  WBC 2.7*  NEUTROABS 1.9  HGB 9.9*  HCT 29.7*  MCV 97.7  PLT 81*   Basic Metabolic Panel: Recent Labs  Lab 03/14/18 1212  NA 134*  K 3.7  CL 101  CO2 23  GLUCOSE 101*  BUN 26*  CREATININE 1.72*  CALCIUM 8.1*   GFR: Estimated Creatinine Clearance: 28 mL/min (A) (by C-G formula based on SCr of 1.72 mg/dL (H)). Liver Function Tests: Recent Labs  Lab 03/14/18 1212  AST 15  ALT 24  ALKPHOS 47  BILITOT 1.6*  PROT 5.9*  ALBUMIN 2.7*   No results for input(s): LIPASE, AMYLASE in the last 168 hours. No results for input(s): AMMONIA in the last 168 hours. Coagulation Profile: Recent Labs  Lab 03/14/18 1023  INR 1.40   Cardiac Enzymes: No results for input(s): CKTOTAL, CKMB, CKMBINDEX, TROPONINI in the last 168 hours. BNP (last 3 results) No results for input(s): PROBNP in the last 8760 hours. HbA1C: No results for input(s): HGBA1C in the last 72 hours. CBG: No results for input(s): GLUCAP in the last 168 hours. Lipid Profile: No results for input(s): CHOL, HDL, LDLCALC, TRIG, CHOLHDL, LDLDIRECT in the last 72 hours. Thyroid Function Tests: No results for input(s): TSH, T4TOTAL, FREET4, T3FREE, THYROIDAB in the last 72 hours. Anemia Panel: No results for input(s): VITAMINB12, FOLATE, FERRITIN, TIBC, IRON, RETICCTPCT in the last 72 hours. Urine analysis:    Component Value Date/Time   COLORURINE AMBER (A) 02/22/2018 0156   APPEARANCEUR CLEAR 02/22/2018 0156   LABSPEC 1.016 02/22/2018 0156   PHURINE 5.0 02/22/2018 0156   GLUCOSEU NEGATIVE 02/22/2018 0156   HGBUR NEGATIVE 02/22/2018 0156   BILIRUBINUR NEGATIVE 02/22/2018 0156   KETONESUR NEGATIVE 02/22/2018 0156    PROTEINUR NEGATIVE 02/22/2018 0156   UROBILINOGEN 0.2 05/09/2015 1523   NITRITE NEGATIVE 02/22/2018 0156   LEUKOCYTESUR NEGATIVE 02/22/2018 0156   Sepsis Labs: '@LABRCNTIP' (procalcitonin:4,lacticidven:4) )No results found for this or any previous visit (from the past 240 hour(s)).   Radiological Exams on Admission: Dg Chest Portable 1 View  Result Date: 03/14/2018 CLINICAL DATA:  History of CHF.  Cough. EXAM: PORTABLE CHEST 1 VIEW COMPARISON:  February 22, 2018 FINDINGS: Stable cardiomegaly. Mild bilateral pulmonary opacities are somewhat patchy in appearance but more focal in the right suprahilar region and left base. The hila and mediastinum are stable. No pneumothorax. No other acute abnormalities. IMPRESSION: 1. Bilateral pulmonary opacities are somewhat patchy in appearance, more focal in the right suprahilar region and left base. While asymmetric edema is possible, the patchy nature suggests the possibility of multifocal pneumonia. Recommend clinical correlation and follow-up to resolution. Electronically Signed   By: Dorise Bullion III M.D   On: 03/14/2018 10:39    EKG: (Independently reviewed) 100% ventricular paced rhythm, regular rate 67 bpm, QTC 444 ms, underlying right bundle branch block  Assessment/Plan Principal Problem:   HCAP (healthcare-associated pneumonia)/SIRS (tachypnea, tachycardia, fever, source of infection) -Patient presents with URI symptoms as described above associated with congested cough, rhinorrhea, sharp right temporal pain at  eye level with cough effort, as well as fever.  Chest x-ray consistent with multifocal bilateral pneumonia -Influenza PCR negative but will obtain respiratory viral panel to rule out other viral etiology -Suspect may have started as viral syndrome and/or allergy related and has progressed to secondary bacterial pneumonia -Recent hospital admission so treat as HCAP; continue Maxipime and vancomycin IV -Room air pulse oximetry not available so  uncertain if truly has hypoxemic respiratory failure -CT chest without contrast to better characterize -ESR, upper respiratory tract Procalcitonin, urinary strep -We will up on blood cultures obtained in ER -IS every 4 hours -Appears dehydrated with concentrated urine so will utilize normal saline IV fluid at 75/hr -Solu Medrol 60 mg IV every 5 hours PSI/PORT Score: Pneumonia Severity Index for Adult CAP Patient age: 84 Years Male: No (0 points) Nursing home resident: No (0 points) Neoplastic disease history: No (0 points) Liver disease: No (0 points) Congestive heart failure: Yes (10 points) Cerebrovascular disease: Yes (10 points) Renal disease: Yes (10 points) Altered mental status: No (0 points) Respiratory rate over 29: Yes (20 points) Systolic blood pressure below 90: No (0 points) Temp below 35.0 deg C (95 deg F) or over 39.9 deg C (103.8 deg F): No (0 points) Pulse over 124: No (0 points) pH below 7.35: No (0 points) BUN over 29: No (0 points) Sodium below 130: No (0 points) Glucose over 249 ([place]) or over 13.8 (SI): No (0 points) Hematocrit below 30%: No (0 points) Partial pressure of oxygen below 60: No (0 points) Pleural effusion on x-ray: No (0 points)  Score: 121 points. Risk class IV, >2.8% mortality.  Active Problems:   COPD (chronic obstructive pulmonary disease)  -Not actively wheezing but likely has a component of exacerbation -History of PAF and current tachycardia will utilize scheduled Xopenex nebs -Steroids as above    Chronic combined systolic and diastolic CHF (congestive heart failure)  -No apparent exacerbation based on clinical exam -Patient has mildly/chronically elevated BNP -Appears to be dehydrated so we will hold diuretic -Echocardiogram 2019: EF 40 to 45% with grade 2 diastolic dysfunction and mild pulmonary hypertension without valvular heart disease detected -Telemetry weights, strict I's/O -Given current suboptimal BP readings we will  hold preadmission carvedilol -Not on ACE I/ARB secondary to CKD    CKD (chronic kidney disease), stage III  -Renal function stable and at baseline    Paroxysmal atrial fibrillation  -Current 100% ventricular paced rhythm -Not on anticoagulation secondary to prior GI bleeding    Carotid stenosis, right -Documented a symptomatic with recent hospitalization for TIA symptoms in April -Has followed up with [facility] since that admission with consideration given to possibility of pursuing TCAR-patient wanted to improve conditioning prior to pursuing intervention    CAD -Continue aspirin and Plavix -Being preadmission nitrate and beta-blocker in the context of suboptimal blood pressure readings -Continue Ranexa -Cardiac catheterization May 2018 100 unstable angina; at that time he underwent angioplasty/DES to proximal left circumflex with dual antiplatelet therapy recommended for at least one year and preferably indefinitely -Currently no reports of chest pain    Bilateral conjunctivitis -Has been treated initially in the outpatient setting as allergic conjunctivitis with Xalatan-continue -Does have yellow purulent drainage and right-sided eye pain with coughing so consider bacterial conjunctivitis and add Garamycin ophthalmologic solution    Pacemaker    H/O: GI bleed    Anemia/thrombocytopenia -Hgb stable and at baseline of 9-10.2 -Platelets somewhat lower than normal at 81,000-continue to trend    Hypothyroidism -Continue  Synthroid    **Additional lab, imaging and/or diagnostic evaluation at discretion of supervising physician  DVT prophylaxis: SCDs in context of platelets less than 100,000 Code Status: Full Family Communication: Daughter Disposition Plan: Home Consults called: None    Coy Vandoren L. ANP-BC [facility] Pager [phone]   If 7PM-7AM, please contact night-coverage [email or web address] Password [email or web address]  03/14/2018, 3:14 PM

## 2018-03-14 NOTE — Progress Notes (Signed)
Pharmacy Antibiotic Note  John Peck is a 84 y.o. male admitted on 03/14/2018 with sepsis.  Pharmacy has been consulted for vancomycin/cefepime dosing. Tmax/24h 100.3, WBC 2.7, LA 1.24. ESRD on HD MWF - missed last 2 HD sessions PTA (5/3 and today).  Plan: Cefepime 2g IV x 1 given in the ED; then 1g IV q24h (change to 2g IV qHD days at 1800 when HD schedule known) Vancomycin 1750mg  IV x1; then 750mg  IV qHD MWF Monitor clinical progress, c/s, abx plan/LOT Pre-HD vancomycin level as indicated F/u HD schedule/tolerance inpatient for antibiotic maintenance doses   Height: 5\' 9"  (175.3 cm) Weight: 168 lb (76.2 kg) IBW/kg (Calculated) : 70.7  Temp (24hrs), Avg:100.3 F (37.9 C), Min:100.3 F (37.9 C), Max:100.3 F (37.9 C)  Recent Labs  Lab 03/14/18 1023 03/14/18 1043  WBC 2.7*  --   LATICACIDVEN  --  1.24    Estimated Creatinine Clearance: 37.3 mL/min (A) (by C-G formula based on SCr of 1.29 mg/dL (H)).    No Known Allergies  Antimicrobials this admission: 5/6 vancomycin >>  5/6 cefepime >>   Dose adjustments this admission:   Microbiology results:   Elicia Lamp, PharmD, BCPS Clinical Pharmacist 03/14/2018 12:33 PM

## 2018-03-15 ENCOUNTER — Inpatient Hospital Stay (HOSPITAL_COMMUNITY): Payer: Medicare HMO

## 2018-03-15 ENCOUNTER — Inpatient Hospital Stay: Payer: Medicare HMO | Admitting: Internal Medicine

## 2018-03-15 LAB — COMPREHENSIVE METABOLIC PANEL
ALK PHOS: 52 U/L (ref 38–126)
ALT: 43 U/L (ref 17–63)
ANION GAP: 10 (ref 5–15)
AST: 39 U/L (ref 15–41)
Albumin: 2.5 g/dL — ABNORMAL LOW (ref 3.5–5.0)
BUN: 29 mg/dL — ABNORMAL HIGH (ref 6–20)
CALCIUM: 8 mg/dL — AB (ref 8.9–10.3)
CO2: 20 mmol/L — ABNORMAL LOW (ref 22–32)
CREATININE: 1.63 mg/dL — AB (ref 0.61–1.24)
Chloride: 107 mmol/L (ref 101–111)
GFR, EST AFRICAN AMERICAN: 41 mL/min — AB (ref >60)
GFR, EST NON AFRICAN AMERICAN: 35 mL/min — AB (ref >60)
Glucose, Bld: 120 mg/dL — ABNORMAL HIGH (ref 65–99)
Potassium: 4.4 mmol/L (ref 3.5–5.1)
Sodium: 137 mmol/L (ref 135–145)
TOTAL PROTEIN: 5.9 g/dL — AB (ref 6.5–8.1)
Total Bilirubin: 1.6 mg/dL — ABNORMAL HIGH (ref 0.3–1.2)

## 2018-03-15 LAB — PROCALCITONIN: PROCALCITONIN: 5.46 ng/mL

## 2018-03-15 MED ORDER — IPRATROPIUM-ALBUTEROL 0.5-2.5 (3) MG/3ML IN SOLN
3.0000 mL | Freq: Three times a day (TID) | RESPIRATORY_TRACT | Status: DC
  Administered 2018-03-16 (×3): 3 mL via RESPIRATORY_TRACT
  Filled 2018-03-15 (×3): qty 3

## 2018-03-15 MED ORDER — VANCOMYCIN HCL IN DEXTROSE 1-5 GM/200ML-% IV SOLN
1000.0000 mg | INTRAVENOUS | Status: DC
  Administered 2018-03-15 – 2018-03-17 (×3): 1000 mg via INTRAVENOUS
  Filled 2018-03-15 (×4): qty 200

## 2018-03-15 MED ORDER — PIPERACILLIN-TAZOBACTAM 3.375 G IVPB
3.3750 g | Freq: Three times a day (TID) | INTRAVENOUS | Status: DC
  Administered 2018-03-15 – 2018-03-19 (×13): 3.375 g via INTRAVENOUS
  Filled 2018-03-15 (×15): qty 50

## 2018-03-15 NOTE — Evaluation (Signed)
Physical Therapy Evaluation Patient Details Name: John Peck MRN: [id number] DOB: [birth year] Today's Date: 03/15/2018   History of Present Illness  84 y.o. male with a PMH of afib not on AC due to GI bleeding; pacemaker placement; HTN; HLD: stage 3 CKD; CAD s/p CABG and PCI; R carotid stenosis; and chronic combined heart failure who presents 03/14/18 with cough and SOB; worked up for HCAP. Of note, pt admitted from 4/16-18 for presumed CVA related to right ICA stenosis.      Clinical Impression  Pt presents with an overall decrease in functional mobility secondary to above. PTA, pt lives at home with wife and amb with rollator; family provides PRN assist. Today, amb 72' with RW and close min guard for balance due to slow, shuffling gait; limited by fatigue. Pt at significant increased risk for falls. Discussed recommendation for short-term SNF-level therapies to maximize functional mobility and independence prior to return home; pt in agreement with this. PT will follow acutely to address established goals.     Follow Up Recommendations SNF;Supervision/Assistance - 24 hour    Equipment Recommendations  None recommended by PT    Recommendations for Other Services       Precautions / Restrictions Precautions Precautions: Fall Restrictions Weight Bearing Restrictions: No      Mobility  Bed Mobility Overal bed mobility: Needs Assistance Bed Mobility: Supine to Sit     Supine to sit: Mod assist     General bed mobility comments: Received sitting in recliner  Transfers Overall transfer level: Needs assistance Equipment used: Rolling walker (2 wheeled) Transfers: Sit to/from Stand Sit to Stand: Min guard         General transfer comment: Close min guard for balance standing with RW; cues for safe hand placement and to achieve fully upright standing  Ambulation/Gait Ambulation/Gait assistance: Min guard Ambulation Distance (Feet): 30 Feet Assistive device: Rolling  walker (2 wheeled) Gait Pattern/deviations: Decreased stride length;Trunk flexed;Drifts right/left;Shuffle;Step-to pattern Gait velocity: Decreased Gait velocity interpretation: <1.31 ft/sec, indicative of household ambulator General Gait Details: Slow, shuffling amb with RW and close min guard for balance. Able to slightly increase stride length with cues. Cues to maintain upright posture and closer proximity to [employer] Mobility    Modified Rankin (Stroke Patients Only)       Balance Overall balance assessment: Needs assistance;History of Falls   Sitting balance-Leahy Scale: Fair       Standing balance-Leahy Scale: Poor Standing balance comment: reliant on external support                             Pertinent Vitals/Pain Pain Assessment: No/denies pain Faces Pain Scale: No hurt    Home Living Family/patient expects to be discharged to:: Private residence Living Arrangements: Spouse/significant other;Children Available Help at Discharge: Family;Available 24 hours/day Type of Home: House Home Access: Stairs to enter Entrance Stairs-Rails: None Entrance Stairs-Number of Steps: 1 Home Layout: Two level;Able to live on main level with bedroom/bathroom Home Equipment: Walker - 4 wheels;Bedside commode;Cane - single point;Shower seat Additional Comments: patient reports that he sponge bathes    Prior Function Level of Independence: Needs assistance   Gait / Transfers Assistance Needed: Reports mod indep with rollator for household ambulation; enjoys walking. Endorses recent fall that RLE "gave out"  ADL's / Homemaking Assistance Needed: wife assists him with dressing and bathing (sponge bathes).  Pt reports he can bathe with setup at sit > stand level at sink, requires assist for donning socks/shoes        Hand Dominance   Dominant Hand: Right    Extremity/Trunk Assessment   Upper Extremity Assessment Upper Extremity  Assessment: Generalized weakness    Lower Extremity Assessment Lower Extremity Assessment: RLE deficits/detail;Overall WFL for tasks assessed RLE Deficits / Details: Strength WFL RLE Sensation: decreased light touch(slight in lower LE)    Cervical / Trunk Assessment Cervical / Trunk Assessment: Kyphotic(forward head)  Communication   Communication: HOH  Cognition Arousal/Alertness: Awake/alert Behavior During Therapy: WFL for tasks assessed/performed Overall Cognitive Status: No family/caregiver present to determine baseline cognitive functioning Area of Impairment: Attention;Awareness;Problem solving;Safety/judgement                   Current Attention Level: Selective     Safety/Judgement: Decreased awareness of safety Awareness: Emergent Problem Solving: Slow processing;Requires verbal cues General Comments: No family available to confirm cognition but most likely close to baseline      General Comments      Exercises     Assessment/Plan    PT Assessment Patient needs continued PT services  PT Problem List Decreased activity tolerance;Decreased balance;Decreased mobility;Decreased safety awareness;Decreased knowledge of use of DME       PT Treatment Interventions DME instruction;Gait training;Functional mobility training;Therapeutic activities;Therapeutic exercise;Patient/family education;Balance training    PT Goals (Current goals can be found in the Care Plan section)  Acute Rehab PT Goals Patient Stated Goal: Agreeable to rehab PT Goal Formulation: With patient Time For Goal Achievement: 03/29/18 Potential to Achieve Goals: Good    Frequency Min 3X/week   Barriers to discharge        Co-evaluation               AM-PAC PT "6 Clicks" Daily Activity  Outcome Measure Difficulty turning over in bed (including adjusting bedclothes, sheets and blankets)?: A Little Difficulty moving from lying on back to sitting on the side of the bed? : A  Little Difficulty sitting down on and standing up from a chair with arms (e.g., wheelchair, bedside commode, etc,.)?: A Little Help needed moving to and from a bed to chair (including a wheelchair)?: A Little Help needed walking in hospital room?: A Little Help needed climbing 3-5 steps with a railing? : A Lot 6 Click Score: 17    End of Session Equipment Utilized During Treatment: Gait belt Activity Tolerance: Patient tolerated treatment well Patient left: in chair;with call bell/phone within reach;with chair alarm set Nurse Communication: Mobility status PT Visit Diagnosis: Unsteadiness on feet (R26.81);Other abnormalities of gait and mobility (R26.89)    Time: 7902-4097 PT Time Calculation (min) (ACUTE ONLY): 17 min   Charges:   PT Evaluation $PT Eval Moderate Complexity: 1 Mod     PT G Codes:       Mabeline Caras, PT, DPT Acute Rehab Services  Pager: [place] 03/15/2018, 5:28 PM

## 2018-03-15 NOTE — Progress Notes (Signed)
PROGRESS NOTE    John Peck  JJO:[id number] DOB: Dec 06, 1923 DOA: 03/14/2018 PCP: Seward Carol, MD   Brief Narrative:  84 year old with a history of atrial fibrillation not on anticoagulation due to history of GI bleed, essential hypertension, hyperlipidemia, CKD stage III, CAD status post CABG, right carotid artery stenosis, pacemaker placement, combined systolic and diastolic CHF comes to the hospital with complains of feeling shortness of breath and cough.  Patient was admitted about 3 weeks ago and was diagnosed with possible stroke related to right-sided ICA stenosis.  After going home he was doing better at first but then over the course of last few days his breathing worsened with subjective fevers and chills.  In the ER here chest x-ray showed concerns for multifocal pneumonia secondary to possible healthcare associated pneumonia versus aspiration.  Initially started on vancomycin and cefepime which was later switched to vancomycin and Zosyn.   Assessment & Plan:   Principal Problem:   HCAP (healthcare-associated pneumonia) Active Problems:   Pacemaker   Chronic combined systolic and diastolic CHF (congestive heart failure) (HCC)   COPD (chronic obstructive pulmonary disease) (HCC)   H/O: GI bleed   CKD (chronic kidney disease), stage III (HCC)   Anemia   Paroxysmal atrial fibrillation (HCC)   Hypothyroidism   Carotid stenosis, right   Bilateral conjunctivitis  Acute respiratory distress with slight hypoxia, 90% on room air Healthcare acquired pneumonia versus aspiration pneumonia - Continue patient on nebulizer treatments, provide supportive care, supplemental oxygen -Continue vancomycin for now.  Will change cefepime to Zosyn -Speech and swallow evaluation has been ordered-we will follow-up their recommendations and make appropriate changes. -Currently also on Solu-Medrol 60 mg every 12 hours, patient is significantly wheezing therefore will continue this for  now.  Combined systolic and diastolic congestive heart failure, ejection fraction 40% with grade 2 diastolic dysfunction Coronary artery disease, stable status post CABG - Continue patient's home medications.  Patient is not on ACE inhibitor or ARB due to CKD - Provide supportive care.  Continue aspirin, Plavix, Ranexa, statin  Paroxysmal atrial fibrillation Pacemaker in place -Currently patient is ventricular rhythm.  Not on anticoagulation due to history of previous GI bleeding.  Hypothyroidism -Continue Synthroid  Recent CVA likely secondary to right-sided ICA stenosis  BPH -Continue Flomax   DVT prophylaxis: SCDs Code Status: Full code Family Communication: None at bedside Disposition Plan: Maintain inpatient stay for another 24-48 hours patient continues to wheeze  Consultants:   None  Procedures:   None  Antimicrobials:   Vanc 5/6>>  Cefepime 5/6> 5/7  Zosyn 5/7>>   Subjective: Patient states he is feeling little better in terms of breathing today, no other complaints.  No other acute events overnight.  Review of Systems Otherwise negative except as per HPI, including: General: Denies fever, chills, night sweats or unintended weight loss. Resp: Denies cough, wheezing, shortness of breath. Cardiac: Denies chest pain, palpitations, orthopnea, paroxysmal nocturnal dyspnea. GI: Denies abdominal pain, nausea, vomiting, diarrhea or constipation GU: Denies dysuria, frequency, hesitancy or incontinence MS: Denies muscle aches, joint pain or swelling Neuro: Denies headache, neurologic deficits (focal weakness, numbness, tingling), abnormal gait Psych: Denies anxiety, depression, SI/HI/AVH Skin: Denies new rashes or lesions ID: Denies sick contacts, exotic exposures, travel  Objective: Vitals:   03/15/18 0100 03/15/18 0500 03/15/18 0742 03/15/18 0822  BP:   (!) 162/72   Pulse:   68   Resp:   18   Temp:   97.9 F (36.6 C)   TempSrc:  Oral   SpO2: 96%   98% 96%  Weight:  82 kg (180 lb 12.4 oz)    Height:        Intake/Output Summary (Last 24 hours) at 03/15/2018 1031 Last data filed at 03/15/2018 0324 Gross per 24 hour  Intake 2591.25 ml  Output 275 ml  Net 2316.25 ml   Filed Weights   03/14/18 1211 03/15/18 0034 03/15/18 0500  Weight: 76.2 kg (168 lb) 78.9 kg (173 lb 15.1 oz) 82 kg (180 lb 12.4 oz)    Examination:  General exam: Appears calm and comfortable  Respiratory system: Diffuse expiratory wheezing Cardiovascular system: S1 & S2 heard, RRR. No JVD, murmurs, rubs, gallops or clicks. No pedal edema. Gastrointestinal system: Abdomen is nondistended, soft and nontender. No organomegaly or masses felt. Normal bowel sounds heard. Central nervous system: Alert and oriented. No focal neurological deficits. Extremities: Symmetric 5 x 5 power. Skin: No rashes, lesions or ulcers Psychiatry: Judgement and insight appear normal. Mood & affect appropriate.     Data Reviewed:   CBC: Recent Labs  Lab 03/14/18 1023  WBC 2.7*  NEUTROABS 1.9  HGB 9.9*  HCT 29.7*  MCV 97.7  PLT 81*   Basic Metabolic Panel: Recent Labs  Lab 03/14/18 1212 03/15/18 0354  NA 134* 137  K 3.7 4.4  CL 101 107  CO2 23 20*  GLUCOSE 101* 120*  BUN 26* 29*  CREATININE 1.72* 1.63*  CALCIUM 8.1* 8.0*   GFR: Estimated Creatinine Clearance: 29.5 mL/min (A) (by C-G formula based on SCr of 1.63 mg/dL (H)). Liver Function Tests: Recent Labs  Lab 03/14/18 1212 03/15/18 0354  AST 15 39  ALT 24 43  ALKPHOS 47 52  BILITOT 1.6* 1.6*  PROT 5.9* 5.9*  ALBUMIN 2.7* 2.5*   No results for input(s): LIPASE, AMYLASE in the last 168 hours. No results for input(s): AMMONIA in the last 168 hours. Coagulation Profile: Recent Labs  Lab 03/14/18 1023  INR 1.40   Cardiac Enzymes: No results for input(s): CKTOTAL, CKMB, CKMBINDEX, TROPONINI in the last 168 hours. BNP (last 3 results) No results for input(s): PROBNP in the last 8760 hours. HbA1C: No  results for input(s): HGBA1C in the last 72 hours. CBG: No results for input(s): GLUCAP in the last 168 hours. Lipid Profile: No results for input(s): CHOL, HDL, LDLCALC, TRIG, CHOLHDL, LDLDIRECT in the last 72 hours. Thyroid Function Tests: No results for input(s): TSH, T4TOTAL, FREET4, T3FREE, THYROIDAB in the last 72 hours. Anemia Panel: No results for input(s): VITAMINB12, FOLATE, FERRITIN, TIBC, IRON, RETICCTPCT in the last 72 hours. Sepsis Labs: Recent Labs  Lab 03/14/18 1043 03/14/18 1301 03/14/18 1619 03/15/18 0354  PROCALCITON  --   --  4.48 5.46  LATICACIDVEN 1.24 1.21  --   --     Recent Results (from the past 240 hour(s))  Respiratory Panel by PCR     Status: None   Collection Time: 03/14/18 11:32 AM  Result Value Ref Range Status   Adenovirus NOT DETECTED NOT DETECTED Final   Coronavirus 229E NOT DETECTED NOT DETECTED Final   Coronavirus HKU1 NOT DETECTED NOT DETECTED Final   Coronavirus NL63 NOT DETECTED NOT DETECTED Final   Coronavirus OC43 NOT DETECTED NOT DETECTED Final   Metapneumovirus NOT DETECTED NOT DETECTED Final   Rhinovirus / Enterovirus NOT DETECTED NOT DETECTED Final   Influenza A NOT DETECTED NOT DETECTED Final   Influenza B NOT DETECTED NOT DETECTED Final   Parainfluenza Virus 1 NOT DETECTED NOT DETECTED  Final   Parainfluenza Virus 2 NOT DETECTED NOT DETECTED Final   Parainfluenza Virus 3 NOT DETECTED NOT DETECTED Final   Parainfluenza Virus 4 NOT DETECTED NOT DETECTED Final   Respiratory Syncytial Virus NOT DETECTED NOT DETECTED Final   Bordetella pertussis NOT DETECTED NOT DETECTED Final   Chlamydophila pneumoniae NOT DETECTED NOT DETECTED Final   Mycoplasma pneumoniae NOT DETECTED NOT DETECTED Final    Comment: Performed at [facility], [address] [place], [place] [address]         Radiology Studies: Dg Chest Portable 1 View  Result Date: 03/14/2018 CLINICAL DATA:  History of CHF.  Cough. EXAM: PORTABLE CHEST 1 VIEW  COMPARISON:  February 22, 2018 FINDINGS: Stable cardiomegaly. Mild bilateral pulmonary opacities are somewhat patchy in appearance but more focal in the right suprahilar region and left base. The hila and mediastinum are stable. No pneumothorax. No other acute abnormalities. IMPRESSION: 1. Bilateral pulmonary opacities are somewhat patchy in appearance, more focal in the right suprahilar region and left base. While asymmetric edema is possible, the patchy nature suggests the possibility of multifocal pneumonia. Recommend clinical correlation and follow-up to resolution. Electronically Signed   By: Dorise Bullion III M.D   On: 03/14/2018 10:39        Scheduled Meds: . aspirin EC  81 mg Oral Daily  . atorvastatin  40 mg Oral Daily  . clopidogrel  75 mg Oral Q1500  . ezetimibe  10 mg Oral Daily  . ipratropium-albuterol  3 mL Nebulization Q6H  . latanoprost  1 drop Both Eyes Once per day on Mon Thu  . levothyroxine  125 mcg Oral QAC breakfast  . methylPREDNISolone (SOLU-MEDROL) injection  60 mg Intravenous Q12H  . ranolazine  1,000 mg Oral BID  . sodium chloride flush  3 mL Intravenous Q12H  . tamsulosin  0.4 mg Oral Daily  . tobramycin  2 drop Both Eyes Q6H   Continuous Infusions: . sodium chloride 75 mL/hr at 03/15/18 0324  . piperacillin-tazobactam (ZOSYN)  IV 3.375 g (03/15/18 0917)  . vancomycin       LOS: 1 day    I have spent 35 minutes face to face with the patient and on the ward discussing the patients care, assessment, plan and disposition with other care givers. >50% of the time was devoted counseling the patient about the risks and benefits of treatment and coordinating care.     Ankit Arsenio Loader, MD [facility] Pager [phone]   If 7PM-7AM, please contact night-coverage [email or web address] Password [email or web address] 03/15/2018, 10:31 AM

## 2018-03-15 NOTE — Progress Notes (Signed)
Occupational Therapy Evaluation Patient Details Name: John Peck MRN: [id number] DOB: July 25, 1924 Today's Date: 03/15/2018    History of Present Illness 84 y.o. male with a Past Medical History of afib not on AC due to GI bleeding; pacemaker placement; HTN; HLD: stage 3 CKD; CAD s/p CABG and PCI; R carotid stenosis; and chronic combined heart failure who presents with cough and SOB.  Patient was admitted from 4/16-18 for presumed CVA related to right ICA stenosis.     Clinical Impression   PTA, pt living at home with his wife and daughter. Family assists with ADL and mobility @ rollator level as needed. Pt sustained a fall PTA, states "my leg just gave out on me". Pt presents with generalized weakness and a recent fall. O2 sats 96 on RA during activity; dyspnea 3/4 after walking @ 10 ft to chair. Pt required mod A to regain balance and prevent fall  initially upon standing.  Feel pt would benefit from short term rehab at SNF to facilitate a safe return home with family. Will follow acutely to address established goals to facilitate DC to next venue of care.     Follow Up Recommendations  Supervision/Assistance - 24 hour;SNF    Equipment Recommendations  None recommended by OT    Recommendations for Other Services PT consult     Precautions / Restrictions Precautions Precautions: Fall      Mobility Bed Mobility Overal bed mobility: Needs Assistance Bed Mobility: Supine to Sit     Supine to sit:  Mod A      Transfers Overall transfer level: Needs assistance Equipment used: Rolling walker (2 wheeled) Transfers: Sit to/from Stand Sit to Stand: Mod assist(to prevent fall)           Balance Overall balance assessment: Needs assistance;History of Falls   Sitting balance-Leahy Scale: Fair       Standing balance-Leahy Scale: Poor Standing balance comment: reliant on external support                           ADL either performed or assessed with clinical  judgement   ADL Overall ADL's : Needs assistance/impaired Eating/Feeding: Set up   Grooming: Set up;Sitting   Upper Body Bathing: Supervision/ safety;Set up;Sitting   Lower Body Bathing: Sit to/from stand;Moderate assistance   Upper Body Dressing : Sitting;Moderate assistance   Lower Body Dressing: Moderate assistance   Toilet Transfer: Grab bars;Ambulation;RW;Minimal assistance   Toileting- Clothing Manipulation and Hygiene: Minimal assistance Toileting - Clothing Manipulation Details (indicate cue type and reason): able to place urinal     Functional mobility during ADLs: Rolling walker;Cueing for safety;Minimal assistance General ADL Comments: Pt states he sleeps in his recliner     Vision Baseline Vision/History: Wears glasses Wears Glasses: At all times Patient Visual Report: No change from baseline Vision Assessment?: No apparent visual deficits     Perception     Praxis      Pertinent Vitals/Pain Pain Assessment: No/denies pain Faces Pain Scale: No hurt     Hand Dominance Right   Extremity/Trunk Assessment Upper Extremity Assessment Upper Extremity Assessment: Generalized weakness(but functional)   Lower Extremity Assessment Lower Extremity Assessment: Generalized weakness;Defer to PT evaluation(appears to have sensorimotor difficulty with RLE)   Cervical / Trunk Assessment Cervical / Trunk Assessment: Kyphotic;Other exceptions(forward head)   Communication Communication Communication: No difficulties;HOH   Cognition Arousal/Alertness: Awake/alert Behavior During Therapy: WFL for tasks assessed/performed Overall Cognitive Status: No family/caregiver present to  determine baseline cognitive functioning Area of Impairment: Attention;Awareness;Problem solving;Safety/judgement                   Current Attention Level: Selective     Safety/Judgement: Decreased awareness of safety Awareness: Emergent Problem Solving: Slow processing;Requires  verbal cues General Comments: No family available to confirm cognition but most likely close to baseline   General Comments       Exercises     Shoulder Instructions      Home Living Family/patient expects to be discharged to:: Private residence Living Arrangements: Spouse/significant other;Children Available Help at Discharge: Family;Available 24 hours/day Type of Home: House Home Access: Stairs to enter [employer] of Steps: 1 Entrance Stairs-Rails: None Home Layout: Two level;Able to live on main level with bedroom/bathroom Alternate Level Stairs-Number of Steps: 6 Alternate Level Stairs-Rails: Right Bathroom Shower/Tub: Tub only(only sponge bathes)   Bathroom Toilet: Standard Bathroom Accessibility: Yes How Accessible: Accessible via walker Home Equipment: [place] - 4 wheels;Bedside commode;Cane - single point;Shower seat   Additional Comments: patient reports that he sponge bathes  Lives With: Spouse    Prior Functioning/Environment Level of Independence: Needs assistance  Gait / Transfers Assistance Needed: uses a 4 wheeled walker for mobility ADL's / Homemaking Assistance Needed: wife assists him with dressing and bathing (sponge bathes).  Pt reports he can bathe with setup at sit > stand level at sink, requires assist for donning socks/shoes            OT Problem List: Decreased activity tolerance;Impaired balance (sitting and/or standing);Cardiopulmonary status limiting activity      OT Treatment/Interventions: Self-care/ADL training;DME and/or AE instruction;Therapeutic activities;Patient/family education;Balance training    OT Goals(Current goals can be found in the care plan section) Acute Rehab OT Goals Patient Stated Goal: to go home OT Goal Formulation: With patient Time For Goal Achievement: 03/29/18 Potential to Achieve Goals: Good  OT Frequency: Min 2X/week   Barriers to D/C:            Co-evaluation              AM-PAC PT  "6 Clicks" Daily Activity     Outcome Measure Help from another person eating meals?: None Help from another person taking care of personal grooming?: A Little Help from another person toileting, which includes using toliet, bedpan, or urinal?: A Little Help from another person bathing (including washing, rinsing, drying)?: A Lot Help from another person to put on and taking off regular upper body clothing?: A Little Help from another person to put on and taking off regular lower body clothing?: A Lot 6 Click Score: 17   End of Session Equipment Utilized During Treatment: Gait belt;Rolling walker Nurse Communication: Mobility status  Activity Tolerance: Patient tolerated treatment well Patient left: in chair;with call bell/phone within reach;with chair alarm set  OT Visit Diagnosis: Unsteadiness on feet (R26.81);Muscle weakness (generalized) (M62.81);History of falling (Z91.81)                Time: 1610-9604 OT Time Calculation (min): 29 min Charges:  OT General Charges $OT Visit: 1 Visit OT Evaluation $OT Eval Moderate Complexity: 1 Mod OT Treatments $Self Care/Home Management : 8-22 mins G-Codes:     [facility], OT/L  [phone] 03/15/2018  Tonye Tancredi,HILLARY 03/15/2018, 4:55 PM

## 2018-03-15 NOTE — Progress Notes (Signed)
Modified Barium Swallow Progress Note  Patient Details  Name: John Peck MRN: [id number] Date of Birth: 05-Feb-1924  Today's Date: 03/15/2018  Modified Barium Swallow completed.  Full report located under Chart Review in the Imaging Section.  Brief recommendations include the following:  Clinical Impression  Pt has a mild oropharyngeal dysphagia characterized by lingual pumping and weak lingual manipulation for A/P transit. Pt was not able to clear the barium tablet from his mouth with liquids despite multiple boluses, ultimately needing a bite of puree. Swallow initiation is intermittently delayed, with boluses resting primarily in the valleculae prior to swallow trigger. Changes to the cervical spine as described on CT Neck were noted but did not impede bolus flow. Pt had adequate airway protection with only intermittent, trace penetration with thin liquids that cleared spontaneously upon completion of the swallow. Recommend that he initiate regular diet textures and thin liquids; meds whole in puree. SLP will f/u briefly for tolerance given concern for PNA.   Swallow Evaluation Recommendations       SLP Diet Recommendations: Regular solids;Thin liquid   Liquid Administration via: Cup;Straw   Medication Administration: Whole meds with puree   Supervision: Patient able to self feed;Intermittent supervision to cue for compensatory strategies   Compensations: Slow rate;Small sips/bites   Postural Changes: Seated upright at 90 degrees   Oral Care Recommendations: Oral care BID        Germain Osgood 03/15/2018,1:59 PM   Germain Osgood, M.A. CCC-SLP [phone]

## 2018-03-15 NOTE — Progress Notes (Signed)
Pharmacy Antibiotic Note  John Peck is a 84 y.o. male admitted on 03/14/2018 with sepsis.  Pharmacy has been consulted for vancomycin/Zosyn dosing.   On abx for PNA. Tmax of 100.3, WBC 2.7.  Plan: Stop cefepime Start Zosyn 3.375 gm IV q8h (4 hour infusion) Continue vancomycin 1g IV Q24h Monitor clinical picture, renal function, VT prn F/U C&S, abx deescalation / LOT  Stop vanc if for aspiration PNA?  Height: 5\' 9"  (175.3 cm) Weight: 180 lb 12.4 oz (82 kg) IBW/kg (Calculated) : 70.7  Temp (24hrs), Avg:99.4 F (37.4 C), Min:97.9 F (36.6 C), Max:100.3 F (37.9 C)  Recent Labs  Lab 03/14/18 1023 03/14/18 1043 03/14/18 1212 03/14/18 1301 03/15/18 0354  WBC 2.7*  --   --   --   --   CREATININE  --   --  1.72*  --  1.63*  LATICACIDVEN  --  1.24  --  1.21  --     Estimated Creatinine Clearance: 29.5 mL/min (A) (by C-G formula based on SCr of 1.63 mg/dL (H)).    No Known Allergies  Antimicrobials this admission: 5/6 vancomycin >>  5/6 cefepime >> 5/7 5/7 Zosyn >>  Dose adjustments this admission:   Microbiology results:   Elenor Quinones, PharmD, [facility] Clinical Pharmacist Pager [phone] 03/15/2018 7:59 AM

## 2018-03-15 NOTE — Evaluation (Signed)
Clinical/Bedside Swallow Evaluation Patient Details  Name: John Peck MRN: [id number] Date of Birth: January 01, 1924  Today's Date: 03/15/2018 Time: SLP Start Time (ACUTE ONLY): 9678 SLP Stop Time (ACUTE ONLY): 0916 SLP Time Calculation (min) (ACUTE ONLY): 19 min  Past Medical History:  Past Medical History:  Diagnosis Date  . Anemia   . Arthritis   . CAD (coronary artery disease)    a. CABG '89; b. PCI '04; c. 11/2015 Cath/PCI: LM 20ost, LAD 130m, LCX 30p, OM2 40, RCA 30p, 10p ISR, 90d (3.0x12 Synergy DES), AM 90, VG->OM2 known to be 100, LIMA->LAD not injected, patent in 2015.  . Carotid stenosis, right   . Chronic combined systolic and diastolic CHF (congestive heart failure) ([place])   . CKD (chronic kidney disease), stage III ([place])   . H/O: GI bleed    REMOTE HISTORY  . History of COPD   . Hyperlipidemia   . Hypertensive heart disease   . LBBB (left bundle branch block)   . Pacemaker Sept 2009   St Jude  . PAF (paroxysmal atrial fibrillation) (HCC)    a. not on anticoag due to prior GIB.  . Sick sinus syndrome [facility]) Sept 2009   ST Jude PTVDP   Past Surgical History:  Past Surgical History:  Procedure Laterality Date  . CARDIAC CATHETERIZATION  11/2011   EF 50%; significant native CAD w/80% stenosis of the LAD after 2nd giagonal vessel and septal perforating artery w/total occlusion of the mid left anterior descendiung.; 60-70% ostiasl stenosis in the circumflex vessel followed by 40% proximal stenosis and 70-80% distal circumflex stenosis;   . CARDIAC CATHETERIZATION N/A 11/29/2015   Procedure: Left Heart Cath and Cors/Grafts Angiography;  Surgeon: Burnell Blanks, MD;  Location: [place] CV LAB;  Service: Cardiovascular;  Laterality: N/A;  . CARDIAC CATHETERIZATION N/A 11/29/2015   Procedure: Coronary Stent Intervention;  Surgeon: Burnell Blanks, MD;  Location: [place] CV LAB;  Service: Cardiovascular;  Laterality: N/A;  . CATARACT EXTRACTION  2004  .  CORONARY ANGIOGRAPHY N/A 02/25/2017   Procedure: Coronary Angiography;  Surgeon: Belva Crome, MD;  Location: [place] CV LAB;  Service: Cardiovascular;  Laterality: N/A;  . CORONARY ANGIOPLASTY WITH STENT PLACEMENT  2004   RCA  . CORONARY ARTERY BYPASS GRAFT  1989   had LIMA to his LAD, a vein to the circumflex. In 2004 underwent stenting to his right coronary artery.  . CORONARY STENT INTERVENTION N/A 02/24/2017   Procedure: Coronary Stent Intervention;  Surgeon: Wellington Hampshire, MD;  Location: [place] CV LAB;  Service: Cardiovascular;  Laterality: N/A;  cfx  . HEMORRHOID SURGERY    . INSERT / REPLACE / REMOVE PACEMAKER  07/17/08   DUAL-CHAMBER; PPM-ST.JUDE MEDNET  . LEFT HEART CATH AND CORS/GRAFTS ANGIOGRAPHY N/A 02/24/2017   Procedure: Left Heart Cath and Cors/Grafts Angiography;  Surgeon: Wellington Hampshire, MD;  Location: [place] CV LAB;  Service: Cardiovascular;  Laterality: N/A;  . LEFT HEART CATHETERIZATION WITH CORONARY/GRAFT ANGIOGRAM N/A 12/10/2011   Procedure: LEFT HEART CATHETERIZATION WITH Beatrix Fetters;  Surgeon: [place] Sine, MD;  Location: [facility] CATH LAB;  Service: Cardiovascular;  Laterality: N/A;  . LEFT HEART CATHETERIZATION WITH CORONARY/GRAFT ANGIOGRAM N/A 01/26/2014   Procedure: LEFT HEART CATHETERIZATION WITH Beatrix Fetters;  Surgeon: [place] Sine, MD;  Location: [facility] CATH LAB;  Service: Cardiovascular;  Laterality: N/A;  . PPM GENERATOR CHANGEOUT N/A 12/15/2017   Procedure: PPM GENERATOR CHANGEOUT;  Surgeon: Evans Lance, MD;  Location: [facility]  INVASIVE CV LAB;  Service: Cardiovascular;  Laterality: N/A;  . PROSTATECTOMY     HPI:  Pt is a 84 y.o. male admitted with sepsis secondary to PNA. He had a recent admission 4/16-4/18 for CVA, at which time he passed the RN stroke swallow screen. Prior swallowing evaluation was completed in July 2016 with recommendation for regular textures and thin liquids. CT Neck in April 2019 showed advanced cervical  spondylosis, findings of DISH. PMH also includes: afib not on AC due to GI bleeding; pacemaker placement; HTN; HLD: stage 3 CKD; CAD s/p CABG and PCI; R carotid stenosis; and chronic combined heart failure   Assessment / Plan / Recommendation Clinical Impression  Pt has multiple, audible swallows and immediate throat clearing with sips of water, although vocal quality remains clear. Throat clearing is eliminated with trials of puree. Given findings of DISH in last month's CT of the neck, concern is for a primary structural dysphagia although there is potential for him to be compensating less in light of recent CVA. Will plan for MBS this afternoon to better assess oropharyngeal function. SLP Visit Diagnosis: Dysphagia, unspecified (R13.10)    Aspiration Risk  Moderate aspiration risk    Diet Recommendation NPO except meds   Medication Administration: Crushed with puree    Other  Recommendations Oral Care Recommendations: Oral care QID   Follow up Recommendations (tba)      Frequency and Duration            Prognosis Prognosis for Safe Diet Advancement: Good      Swallow Study   General HPI: Pt is a 84 y.o. male admitted with sepsis secondary to PNA. He had a recent admission 4/16-4/18 for CVA, at which time he passed the RN stroke swallow screen. Prior swallowing evaluation was completed in July 2016 with recommendation for regular textures and thin liquids. CT Neck in April 2019 showed advanced cervical spondylosis, findings of DISH. PMH also includes: afib not on AC due to GI bleeding; pacemaker placement; HTN; HLD: stage 3 CKD; CAD s/p CABG and PCI; R carotid stenosis; and chronic combined heart failure Type of Study: Bedside Swallow Evaluation Previous Swallow Assessment: see HPI Diet Prior to this Study: NPO Temperature Spikes Noted: Yes(100.3) Respiratory Status: Nasal cannula History of Recent Intubation: No Behavior/Cognition: Alert;Cooperative;Pleasant mood Oral Cavity  Assessment: Within Functional Limits Oral Care Completed by SLP: Recent completion by staff Oral Cavity - Dentition: Missing dentition Vision: Functional for self-feeding Self-Feeding Abilities: Able to feed self Patient Positioning: Upright in bed Baseline Vocal Quality: Normal Volitional Cough: Strong Volitional Swallow: Able to elicit    Oral/Motor/Sensory Function Overall Oral Motor/Sensory Function: Within functional limits   Ice Chips Ice chips: Not tested   Thin Liquid Thin Liquid: Impaired Presentation: Cup;Self Fed;Straw Pharyngeal  Phase Impairments: Multiple swallows;Throat Clearing - Immediate;Other (comments)(audible swallows)    Nectar Thick Nectar Thick Liquid: Not tested   Honey Thick Honey Thick Liquid: Not tested   Puree Puree: Impaired Presentation: Self Fed;Spoon Pharyngeal Phase Impairments: Other (comments)(audible swallows)   Solid   GO   Solid: Not tested        Germain Osgood 03/15/2018,10:00 AM  Germain Osgood, M.A. CCC-SLP [phone]

## 2018-03-16 ENCOUNTER — Other Ambulatory Visit: Payer: Self-pay

## 2018-03-16 LAB — BASIC METABOLIC PANEL
ANION GAP: 8 (ref 5–15)
BUN: 36 mg/dL — AB (ref 6–20)
CO2: 20 mmol/L — ABNORMAL LOW (ref 22–32)
Calcium: 8.1 mg/dL — ABNORMAL LOW (ref 8.9–10.3)
Chloride: 108 mmol/L (ref 101–111)
Creatinine, Ser: 1.53 mg/dL — ABNORMAL HIGH (ref 0.61–1.24)
GFR calc Af Amer: 44 mL/min — ABNORMAL LOW (ref >60)
GFR, EST NON AFRICAN AMERICAN: 38 mL/min — AB (ref >60)
GLUCOSE: 147 mg/dL — AB (ref 65–99)
POTASSIUM: 4.2 mmol/L (ref 3.5–5.1)
Sodium: 136 mmol/L (ref 135–145)

## 2018-03-16 LAB — CBC
HCT: 27.1 % — ABNORMAL LOW (ref 39.0–52.0)
Hemoglobin: 9 g/dL — ABNORMAL LOW (ref 13.0–17.0)
MCH: 31.4 pg (ref 26.0–34.0)
MCHC: 33.2 g/dL (ref 30.0–36.0)
MCV: 94.4 fL (ref 78.0–100.0)
PLATELETS: 91 10*3/uL — AB (ref 150–400)
RBC: 2.87 MIL/uL — AB (ref 4.22–5.81)
RDW: 15.6 % — ABNORMAL HIGH (ref 11.5–15.5)
WBC: 4.9 10*3/uL (ref 4.0–10.5)

## 2018-03-16 LAB — PROCALCITONIN: Procalcitonin: 5.15 ng/mL

## 2018-03-16 LAB — TROPONIN I: Troponin I: 0.19 ng/mL (ref <0.03)

## 2018-03-16 LAB — MAGNESIUM: MAGNESIUM: 1.9 mg/dL (ref 1.7–2.4)

## 2018-03-16 MED ORDER — GUAIFENESIN-DM 100-10 MG/5ML PO SYRP
5.0000 mL | ORAL_SOLUTION | ORAL | Status: DC | PRN
  Administered 2018-03-17 (×2): 5 mL via ORAL
  Filled 2018-03-16 (×2): qty 5

## 2018-03-16 MED ORDER — NITROGLYCERIN 0.4 MG SL SUBL
0.4000 mg | SUBLINGUAL_TABLET | SUBLINGUAL | Status: DC | PRN
  Administered 2018-03-16 – 2018-03-17 (×3): 0.4 mg via SUBLINGUAL
  Filled 2018-03-16 (×3): qty 1

## 2018-03-16 MED ORDER — METHYLPREDNISOLONE SODIUM SUCC 40 MG IJ SOLR
40.0000 mg | Freq: Every day | INTRAMUSCULAR | Status: DC
  Filled 2018-03-16: qty 1

## 2018-03-16 MED ORDER — TRAZODONE HCL 50 MG PO TABS
50.0000 mg | ORAL_TABLET | Freq: Once | ORAL | Status: AC
  Administered 2018-03-16: 50 mg via ORAL
  Filled 2018-03-16: qty 1

## 2018-03-16 NOTE — NC FL2 (Signed)
[place] LEVEL OF CARE SCREENING TOOL     IDENTIFICATION  Patient Name: John Peck Birthdate: [birth year] Sex: male Admission Date (Current Location): 03/14/2018  [facility] and [place] Number:  Herbalist and Address:  [facility]. [facility], [place] [address] [place], [place] [address]      Provider Number: [id number]  Attending Physician Name and Address:  Shelly Coss, MD  Relative Name and Phone Number:       Current Level of Care: Hospital Recommended Level of Care: [place] Prior Approval Number:    Date Approved/Denied:   PASRR Number: [id number]  Discharge Plan: SNF    Current Diagnoses: Patient Active Problem List   Diagnosis Date Noted  . HCAP (healthcare-associated pneumonia) 03/14/2018  . Chronic combined systolic and diastolic CHF (congestive heart failure) ([place]) 03/14/2018  . COPD (chronic obstructive pulmonary disease) ([place]) 03/14/2018  . H/O: GI bleed 03/14/2018  . CKD (chronic kidney disease), stage III ([place]) 03/14/2018  . Anemia 03/14/2018  . Paroxysmal atrial fibrillation ([place]) 03/14/2018  . Hypothyroidism 03/14/2018  . Carotid stenosis, right 03/14/2018  . Bilateral conjunctivitis 03/14/2018  . Facial droop 02/22/2018  . COPD with acute exacerbation ([place]) 02/22/2018  . Thrombocytopenia ([place]) 02/22/2018  . Generalized weakness   . CAD (coronary artery disease)   . Hypertensive heart disease   . Acute renal failure superimposed on stage 3 chronic kidney disease ([place])   . Hyperlipidemia   . Stented coronary artery   . Coronary artery disease involving native coronary artery of native heart with unstable angina pectoris ([place])   . Chest pain 11/27/2015  . Reactive airway disease 11/27/2015  . Angina pectoris ([place]) 11/27/2015  . CAD in native artery 09/03/2015  . Hyperkalemia 05/09/2015  . Weakness 05/09/2015  . Acute on chronic kidney failure ([place]) 05/09/2015  . Chronic diastolic CHF  (congestive heart failure) ([place]) 05/09/2015  . Hyperlipidemia LDL goal <70 02/28/2015  . Atherosclerosis of lower extremity with claudication ([place]) 07/17/2014  . Unstable angina ([place]) 01/23/2014  . PAF (paroxysmal atrial fibrillation) ([place]) 03/24/2013  . Lower extremity edema 03/24/2013  . Sinoatrial node dysfunction ([place]) 08/17/2012  . History of iron deficiency anemia 09/18/2011  . Essential hypertension 07/07/2009  . INTERMEDIATE CORONARY SYNDROME 07/07/2009  . S/P CABG x 2 '89. RCA stent'04. last cath Jan 2013 07/07/2009  . PACEMAKER, PERMANENT- St Jude Sept 2009 07/07/2009  . Sick sinus syndrome ([place]) 07/10/2008  . Pacemaker 07/10/2008    Orientation RESPIRATION BLADDER Height & Weight     Self, Time, Situation, Place  O2(2L [place]) Continent Weight: 180 lb 12.4 oz (82 kg) Height:  5\' 9"  (175.3 cm)  BEHAVIORAL SYMPTOMS/MOOD NEUROLOGICAL BOWEL NUTRITION STATUS      Continent Diet(see dc summary)  AMBULATORY STATUS COMMUNICATION OF NEEDS Skin   Limited Assist Verbally Normal                       Personal Care Assistance Level of Assistance  Bathing, Dressing Bathing Assistance: Limited assistance   Dressing Assistance: Limited assistance     Functional Limitations Info             SPECIAL CARE FACTORS FREQUENCY  PT (By licensed PT), OT (By licensed OT)     PT Frequency: 5/wk OT Frequency: 5/wk            Contractures      Additional Factors Info  Code Status, Allergies Code Status Info: FULL Allergies  Info: NKA           Current Medications (03/16/2018):  This is the current hospital active medication list Current Facility-Administered Medications  Medication Dose Route Frequency Provider Last Rate Last Dose  . 0.9 %  sodium chloride infusion   Intravenous Continuous Samella Parr, NP 75 mL/hr at 03/16/18 0234    . acetaminophen (TYLENOL) tablet 650 mg  650 mg Oral Q6H PRN Samella Parr, NP   650 mg at 03/15/18 2057   Or  . acetaminophen  (TYLENOL) suppository 650 mg  650 mg Rectal Q6H PRN Samella Parr, NP      . albuterol (PROVENTIL) (2.5 MG/3ML) 0.083% nebulizer solution 2.5 mg  2.5 mg Nebulization Q2H PRN Karmen Bongo, MD      . aspirin EC tablet 81 mg  81 mg Oral Daily Samella Parr, NP   81 mg at 03/16/18 0849  . atorvastatin (LIPITOR) tablet 40 mg  40 mg Oral Daily Samella Parr, NP   40 mg at 03/16/18 0848  . clopidogrel (PLAVIX) tablet 75 mg  75 mg Oral Q1500 Samella Parr, NP   75 mg at 03/15/18 1721  . ezetimibe (ZETIA) tablet 10 mg  10 mg Oral Daily Samella Parr, NP   10 mg at 03/16/18 0849  . guaiFENesin-dextromethorphan (ROBITUSSIN DM) 100-10 MG/5ML syrup 5 mL  5 mL Oral Q4H PRN Adhikari, Amrit, MD      . ipratropium-albuterol (DUONEB) 0.5-2.5 (3) MG/3ML nebulizer solution 3 mL  3 mL Nebulization TID Karmen Bongo, MD   3 mL at 03/16/18 0746  . latanoprost (XALATAN) 0.005 % ophthalmic solution 1 drop  1 drop Both Eyes Once per day on Mon Thu Ellis, Allison L, NP   1 drop at 03/14/18 1853  . levothyroxine (SYNTHROID, LEVOTHROID) tablet 125 mcg  125 mcg Oral QAC breakfast Samella Parr, NP   125 mcg at 03/16/18 6440  . [START ON 03/17/2018] methylPREDNISolone sodium succinate (SOLU-MEDROL) 40 mg/mL injection 40 mg  40 mg Intravenous Daily Adhikari, Amrit, MD      . ondansetron (ZOFRAN) tablet 4 mg  4 mg Oral Q6H PRN Samella Parr, NP       Or  . ondansetron [facility]) injection 4 mg  4 mg Intravenous Q6H PRN Samella Parr, NP   4 mg at 03/15/18 2057  . piperacillin-tazobactam (ZOSYN) IVPB 3.375 g  3.375 g Intravenous Q8H Zimir, Kittleson, RPH 12.5 mL/hr at 03/16/18 0848 3.375 g at 03/16/18 0848  . ranolazine (RANEXA) 12 hr tablet 1,000 mg  1,000 mg Oral BID Samella Parr, NP   1,000 mg at 03/16/18 0850  . sodium chloride flush (NS) 0.9 % injection 3 mL  3 mL Intravenous Q12H Samella Parr, NP   3 mL at 03/16/18 0851  . tamsulosin (FLOMAX) capsule 0.4 mg  0.4 mg Oral Daily Samella Parr, NP   0.4 mg at 03/16/18 0849  . tobramycin (TOBREX) 0.3 % ophthalmic solution 2 drop  2 drop Both Eyes Q6H Samella Parr, NP   2 drop at 03/16/18 0610  . vancomycin (VANCOCIN) IVPB 1000 mg/200 mL premix  1,000 mg Intravenous Q24H Anden, Bartolo, RPH 200 mL/hr at 03/15/18 1721 1,000 mg at 03/15/18 1721     Discharge Medications: Please see discharge summary for a list of discharge medications.  Relevant Imaging Results:  Relevant Lab Results:   Additional Information SS#: [id number]  Jorge Ny, LCSW

## 2018-03-16 NOTE — Progress Notes (Signed)
  Speech Language Pathology Treatment: Dysphagia  Patient Details Name: John Peck MRN: [id number] DOB: 01/07/1924 Today's Date: 03/16/2018 Time: 4353-9122 SLP Time Calculation (min) (ACUTE ONLY): 10 min  Assessment / Plan / Recommendation Clinical Impression  Pt consumed regular textures and thin liquids with no overt signs of aspiration, using safe swallowing precautions with Mod I. He denies any subjective complaints with meals since initiation on previous date. SLP reviewed results/recommendations from [place]. No further acute SLP needs identified.   HPI HPI: Pt is a 84 y.o. male admitted with sepsis secondary to PNA. He had a recent admission 4/16-4/18 for CVA, at which time he passed the RN stroke swallow screen. Prior swallowing evaluation was completed in July 2016 with recommendation for regular textures and thin liquids. CT Neck in April 2019 showed advanced cervical spondylosis, findings of DISH. PMH also includes: afib not on AC due to GI bleeding; pacemaker placement; HTN; HLD: stage 3 CKD; CAD s/p CABG and PCI; R carotid stenosis; and chronic combined heart failure      SLP Plan  All goals met       Recommendations  Diet recommendations: Regular;Thin liquid Liquids provided via: Cup;Straw Medication Administration: Whole meds with puree Supervision: Patient able to self feed;Intermittent supervision to cue for compensatory strategies Compensations: Slow rate;Small sips/bites Postural Changes and/or Swallow Maneuvers: Seated upright 90 degrees                Oral Care Recommendations: Oral care BID Follow up Recommendations: None SLP Visit Diagnosis: Dysphagia, oropharyngeal phase (R13.12) Plan: All goals met       GO                Germain Osgood 03/16/2018, 11:18 AM  Germain Osgood, M.A. CCC-SLP [phone]

## 2018-03-16 NOTE — Clinical Social Work Note (Signed)
Clinical Social Work Assessment  Patient Details  Name: John Peck MRN: [id number] Date of Birth: [birth year]  Date of referral:  03/16/18               Reason for consult:  Facility Placement                Permission sought to share information with:  Chartered certified accountant granted to share information::  Yes, Verbal Permission Granted  Name::     John Peck  Agency::  snf  Relationship::  dtr  Contact Information:     Housing/Transportation Living arrangements for the past 2 months:  Single Family Home Source of Information:  Patient Patient Interpreter Needed:  None Criminal Activity/Legal Involvement Pertinent to Current Situation/Hospitalization:  No - Comment as needed Significant Relationships:  Adult Children, Spouse Lives with:  Adult Children, Spouse Do you feel safe going back to the place where you live?  Yes Need for family participation in patient care:  Yes (Comment)  Care giving concerns:  Pt lives with spouse and dtr who helps with any needs.  Pt now with increased weakness and concerns of fall risk at home after recent h/o falls.   Social Worker assessment / plan:  CSW first spoke with the patient regarding PT recommendation for SNF.  He has had home services in the past which he though would be sufficient- gave permission for CSW to speak with dtr regarding plan.  CSW spoke with dtr John Peck who lives with patient.  John Peck moved back to [place] 4 months ago to help her parents house and is available 24 hours a day to assist.  John Peck does not think home is safest plan at this time and would prefer patient go to SNF if able.  Employment status:  Retired Nurse, adult PT Recommendations:  [place] / Referral to community resources:  [place]  Patient/Family's Response to care:  Patient agreeable to SNF after further discussion with MD and dtr thinks pt would benefit from  extra therapy efforts.  Patient/Family's Understanding of and Emotional Response to Diagnosis, Current Treatment, and Prognosis:  Pt and dtr seem to have good understanding of current condition and need for increased assistance at this time.  Emotional Assessment Appearance:  Appears stated age Attitude/Demeanor/Rapport:    Affect (typically observed):  Appropriate, Pleasant Orientation:  Oriented to Self, Oriented to Place, Oriented to  Time, Oriented to Situation Alcohol / Substance use:  Not Applicable Psych involvement (Current and /or in the community):  No (Comment)  Discharge Needs  Concerns to be addressed:  Care Coordination Readmission within the last 30 days:  Yes Current discharge risk:  Physical Impairment Barriers to Discharge:  Continued Medical Work up   Jorge Ny, LCSW 03/16/2018, 12:12 PM

## 2018-03-16 NOTE — Progress Notes (Deleted)
Patient has declined the CPAP 

## 2018-03-16 NOTE — Progress Notes (Addendum)
PROGRESS NOTE    John Peck  TIR:[id number] DOB: 13-Sep-1924 DOA: 03/14/2018 PCP: Seward Carol, MD   Brief Narrative: Patient is a 84 year old male with past medical history of atrial fibrillation not on anticoagulant due to GI bleed, hypertension, hyperlipidemia, CKD stage III, coronary artery disease status post CABG, right carotid artery stenosis, pacemaker placement, combined systolic and diastolic CHF who presented to the hospital with complaints of shortness of breath and cough.  Patient was just admitted 2 weeks ago and was diagnosed with possible stroke related to right-sided ICA stenosis.  On this presentation, chest x-ray was done which showed concern for multifocal pneumonia.  Patient is currently treated with IV antibiotics.  Assessment & Plan:   Principal Problem:   HCAP (healthcare-associated pneumonia) Active Problems:   Pacemaker   Chronic combined systolic and diastolic CHF (congestive heart failure) (HCC)   COPD (chronic obstructive pulmonary disease) (HCC)   H/O: GI bleed   CKD (chronic kidney disease), stage III (HCC)   Anemia   Paroxysmal atrial fibrillation (HCC)   Hypothyroidism   Carotid stenosis, right   Bilateral conjunctivitis  Acute respiratory failure with hypoxia: Suspect healthcare respiratory pneumonia versus aspiration pneumonia. Respiratory status has improved today.  Continue bronchodilators, supplemental oxygen as necessary.  Patient is not on oxygen at home.  We will continue to taper the oxygen.  Continue broad-spectrum antibiotics. On vancomycin and Zosyn.  Cultures negative so far.  Patient is afebrile. Speech is following for possible aspiration pneumonia.  Recommended regular solids and thin liquid. He was also started on steroids for wheezing.  We will taper the steroids today.  Combined systolic/diastolic heart failure: Last echo showed ejection fraction of 40% with grade 2 diastolic dysfunction.  Currently euvolemic.  History of  coronary artery disease: Status post CABG.  Continue aspirin, Plavix, Ranexa, statin. Not on ACE or ARB due to CKD.  Paroxysmal A. fib: Status post pacemaker.  Not on anti-Coagulation due to history of previous GI bleed.  Hypothyroidism: Continue Synthyroid.  History of BPH: Continue Flomax.  History of CVA: Was recently admitted here.  Has chronic right-sided ICA stenosis.  Patient was evaluated by PT/OT and was recommended skilled nursing facility but he currently wants to go home.  Social worker following.  History of CKD :H/O CKD stage 3.  Baseline creatinine ranges from 1.3-1.4. Was on acute kidney injury on presentation and was started on gentle IV fluids.   DVT prophylaxis: SCD Code Status: Full Family Communication: None present at the bedside Disposition Plan: Likely home with home health /SNF tomorrow   Consultants: None  Procedures:None  Antimicrobials: Vanco since 5/6 and Zosyn since 5/7  Subjective: Patient seen and examined the bedside this morning.  His respiratory status has improved today.  He denies any shortness of breath.  Looks comfortable.  Sitting on the chair   Objective: Vitals:   03/16/18 0039 03/16/18 0300 03/16/18 0746 03/16/18 0900  BP: (!) 158/52 132/64  138/86  Pulse: 78 81  (!) 105  Resp: 18 16  16   Temp: 97.9 F (36.6 C) (!) 97.5 F (36.4 C)  (!) 97.5 F (36.4 C)  TempSrc: Oral Oral  Oral  SpO2: 98% 100% 99% 95%  Weight:      Height:        Intake/Output Summary (Last 24 hours) at 03/16/2018 1024 Last data filed at 03/16/2018 0851 Gross per 24 hour  Intake 243 ml  Output 400 ml  Net -157 ml   Filed Weights   03/14/18  1211 03/15/18 0034 03/15/18 0500  Weight: 76.2 kg (168 lb) 78.9 kg (173 lb 15.1 oz) 82 kg (180 lb 12.4 oz)    Examination:  General exam: Appears calm and comfortable ,Not in distress,obese HEENT:PERRL,Oral mucosa moist, Ear/Nose normal on gross exam Respiratory system: Bilateral coarse breathing sounds, decreased  air entry, bilateral scattered wheezes. Cardiovascular system: S1 & S2 heard, RRR. No JVD, murmurs, rubs, gallops or clicks. No pedal edema. Gastrointestinal system: Abdomen is nondistended, soft and nontender. No organomegaly or masses felt. Normal bowel sounds heard. Central nervous system: Alert and oriented. No focal neurological deficits. Extremities: No edema, no clubbing ,no cyanosis, distal peripheral pulses palpable. Skin: No rashes, lesions or ulcers,no icterus ,no pallor MSK: Normal muscle bulk,tone ,power Psychiatry: Judgement and insight appear normal. Mood & affect appropriate.     Data Reviewed: I have personally reviewed following labs and imaging studies  CBC: Recent Labs  Lab 03/14/18 1023 03/16/18 0243  WBC 2.7* 4.9  NEUTROABS 1.9  --   HGB 9.9* 9.0*  HCT 29.7* 27.1*  MCV 97.7 94.4  PLT 81* 91*   Basic Metabolic Panel: Recent Labs  Lab 03/14/18 1212 03/15/18 0354 03/16/18 0243  NA 134* 137 136  K 3.7 4.4 4.2  CL 101 107 108  CO2 23 20* 20*  GLUCOSE 101* 120* 147*  BUN 26* 29* 36*  CREATININE 1.72* 1.63* 1.53*  CALCIUM 8.1* 8.0* 8.1*  MG  --   --  1.9   GFR: Estimated Creatinine Clearance: 31.4 mL/min (A) (by C-G formula based on SCr of 1.53 mg/dL (H)). Liver Function Tests: Recent Labs  Lab 03/14/18 1212 03/15/18 0354  AST 15 39  ALT 24 43  ALKPHOS 47 52  BILITOT 1.6* 1.6*  PROT 5.9* 5.9*  ALBUMIN 2.7* 2.5*   No results for input(s): LIPASE, AMYLASE in the last 168 hours. No results for input(s): AMMONIA in the last 168 hours. Coagulation Profile: Recent Labs  Lab 03/14/18 1023  INR 1.40   Cardiac Enzymes: No results for input(s): CKTOTAL, CKMB, CKMBINDEX, TROPONINI in the last 168 hours. BNP (last 3 results) No results for input(s): PROBNP in the last 8760 hours. HbA1C: No results for input(s): HGBA1C in the last 72 hours. CBG: No results for input(s): GLUCAP in the last 168 hours. Lipid Profile: No results for input(s):  CHOL, HDL, LDLCALC, TRIG, CHOLHDL, LDLDIRECT in the last 72 hours. Thyroid Function Tests: No results for input(s): TSH, T4TOTAL, FREET4, T3FREE, THYROIDAB in the last 72 hours. Anemia Panel: No results for input(s): VITAMINB12, FOLATE, FERRITIN, TIBC, IRON, RETICCTPCT in the last 72 hours. Sepsis Labs: Recent Labs  Lab 03/14/18 1043 03/14/18 1301 03/14/18 1619 03/15/18 0354 03/16/18 0243  PROCALCITON  --   --  4.48 5.46 5.15  LATICACIDVEN 1.24 1.21  --   --   --     Recent Results (from the past 240 hour(s))  Culture, blood (Routine x 2)     Status: None (Preliminary result)   Collection Time: 03/14/18 10:23 AM  Result Value Ref Range Status   Specimen Description BLOOD BLOOD RIGHT WRIST  Final   Special Requests   Final    BOTTLES DRAWN AEROBIC AND ANAEROBIC Blood Culture adequate volume   Culture   Final    NO GROWTH 2 DAYS Performed at [facility], [place] [address] [place], [place] [address]    Report Status PENDING  Incomplete  Respiratory Panel by PCR     Status: None   Collection Time:  03/14/18 11:32 AM  Result Value Ref Range Status   Adenovirus NOT DETECTED NOT DETECTED Final   Coronavirus 229E NOT DETECTED NOT DETECTED Final   Coronavirus HKU1 NOT DETECTED NOT DETECTED Final   Coronavirus NL63 NOT DETECTED NOT DETECTED Final   Coronavirus OC43 NOT DETECTED NOT DETECTED Final   Metapneumovirus NOT DETECTED NOT DETECTED Final   Rhinovirus / Enterovirus NOT DETECTED NOT DETECTED Final   Influenza A NOT DETECTED NOT DETECTED Final   Influenza B NOT DETECTED NOT DETECTED Final   Parainfluenza Virus 1 NOT DETECTED NOT DETECTED Final   Parainfluenza Virus 2 NOT DETECTED NOT DETECTED Final   Parainfluenza Virus 3 NOT DETECTED NOT DETECTED Final   Parainfluenza Virus 4 NOT DETECTED NOT DETECTED Final   Respiratory Syncytial Virus NOT DETECTED NOT DETECTED Final   Bordetella pertussis NOT DETECTED NOT DETECTED Final   Chlamydophila pneumoniae NOT DETECTED NOT  DETECTED Final   Mycoplasma pneumoniae NOT DETECTED NOT DETECTED Final    Comment: Performed at [facility], [place] [address] [place], [place] [address]         Radiology Studies: Dg Chest Portable 1 View  Result Date: 03/14/2018 CLINICAL DATA:  History of CHF.  Cough. EXAM: PORTABLE CHEST 1 VIEW COMPARISON:  February 22, 2018 FINDINGS: Stable cardiomegaly. Mild bilateral pulmonary opacities are somewhat patchy in appearance but more focal in the right suprahilar region and left base. The hila and mediastinum are stable. No pneumothorax. No other acute abnormalities. IMPRESSION: 1. Bilateral pulmonary opacities are somewhat patchy in appearance, more focal in the right suprahilar region and left base. While asymmetric edema is possible, the patchy nature suggests the possibility of multifocal pneumonia. Recommend clinical correlation and follow-up to resolution. Electronically Signed   By: Dorise Bullion III M.D   On: 03/14/2018 10:39        Scheduled Meds: . aspirin EC  81 mg Oral Daily  . atorvastatin  40 mg Oral Daily  . clopidogrel  75 mg Oral Q1500  . ezetimibe  10 mg Oral Daily  . ipratropium-albuterol  3 mL Nebulization TID  . latanoprost  1 drop Both Eyes Once per day on Mon Thu  . levothyroxine  125 mcg Oral QAC breakfast  . methylPREDNISolone (SOLU-MEDROL) injection  60 mg Intravenous Q12H  . ranolazine  1,000 mg Oral BID  . sodium chloride flush  3 mL Intravenous Q12H  . tamsulosin  0.4 mg Oral Daily  . tobramycin  2 drop Both Eyes Q6H   Continuous Infusions: . sodium chloride 75 mL/hr at 03/16/18 0234  . piperacillin-tazobactam (ZOSYN)  IV 3.375 g (03/16/18 0848)  . vancomycin 1,000 mg (03/15/18 1721)     LOS: 2 days    Time spent: 35 mins.More than 50% of that time was spent in counseling and/or coordination of care.      Shelly Coss, MD [facility] Pager [phone]  If 7PM-7AM, please contact night-coverage [email or web address] Password  [facility] 03/16/2018, 10:24 AM

## 2018-03-16 NOTE — Progress Notes (Addendum)
12:30pm Pt now agreeable to SNF after talking with MD and dtr Nevin Bloodgood.  Family chooses [facility]- they have initiated insurance authorization  9:30am CSW met with pt to discuss  PT recommendation for SNF.  Explained SNF and SNF referral process.  Patient would prefer to go home if able and believes he could manage just fine at home- lives with spouse and dtr Nevin Bloodgood per report.  Pt states he has had home services before and still does the exercises provided to him- would be agreeable to home services coming back out.  Pt provided verbal consent for CSW to talk to dtr to confirm this is a safe plan- left message for dtr Nevin Bloodgood to discuss  Jorge Ny, [place] Social Worker [phone]

## 2018-03-17 ENCOUNTER — Inpatient Hospital Stay (HOSPITAL_COMMUNITY): Payer: Medicare HMO

## 2018-03-17 ENCOUNTER — Other Ambulatory Visit: Payer: Self-pay

## 2018-03-17 DIAGNOSIS — N183 Chronic kidney disease, stage 3 (moderate): Secondary | ICD-10-CM

## 2018-03-17 DIAGNOSIS — I2583 Coronary atherosclerosis due to lipid rich plaque: Secondary | ICD-10-CM

## 2018-03-17 DIAGNOSIS — I10 Essential (primary) hypertension: Secondary | ICD-10-CM

## 2018-03-17 DIAGNOSIS — I2510 Atherosclerotic heart disease of native coronary artery without angina pectoris: Secondary | ICD-10-CM

## 2018-03-17 DIAGNOSIS — I6521 Occlusion and stenosis of right carotid artery: Secondary | ICD-10-CM

## 2018-03-17 DIAGNOSIS — E7800 Pure hypercholesterolemia, unspecified: Secondary | ICD-10-CM

## 2018-03-17 DIAGNOSIS — I4891 Unspecified atrial fibrillation: Secondary | ICD-10-CM

## 2018-03-17 DIAGNOSIS — I5042 Chronic combined systolic (congestive) and diastolic (congestive) heart failure: Secondary | ICD-10-CM

## 2018-03-17 DIAGNOSIS — R079 Chest pain, unspecified: Secondary | ICD-10-CM

## 2018-03-17 DIAGNOSIS — J189 Pneumonia, unspecified organism: Secondary | ICD-10-CM

## 2018-03-17 LAB — BASIC METABOLIC PANEL
Anion gap: 9 (ref 5–15)
BUN: 36 mg/dL — ABNORMAL HIGH (ref 6–20)
CO2: 19 mmol/L — ABNORMAL LOW (ref 22–32)
CREATININE: 1.56 mg/dL — AB (ref 0.61–1.24)
Calcium: 8.4 mg/dL — ABNORMAL LOW (ref 8.9–10.3)
Chloride: 106 mmol/L (ref 101–111)
GFR, EST AFRICAN AMERICAN: 43 mL/min — AB (ref >60)
GFR, EST NON AFRICAN AMERICAN: 37 mL/min — AB (ref >60)
Glucose, Bld: 195 mg/dL — ABNORMAL HIGH (ref 65–99)
POTASSIUM: 4.8 mmol/L (ref 3.5–5.1)
SODIUM: 134 mmol/L — AB (ref 135–145)

## 2018-03-17 LAB — ECHOCARDIOGRAM COMPLETE
Height: 69 in
Weight: 2962.98 oz

## 2018-03-17 LAB — TROPONIN I
Troponin I: 0.19 ng/mL (ref <0.03)
Troponin I: 0.68 ng/mL (ref <0.03)
Troponin I: 1.61 ng/mL (ref <0.03)
Troponin I: 2.57 ng/mL (ref <0.03)

## 2018-03-17 LAB — MRSA PCR SCREENING: MRSA BY PCR: NEGATIVE

## 2018-03-17 MED ORDER — MORPHINE SULFATE (PF) 2 MG/ML IV SOLN
2.0000 mg | Freq: Once | INTRAVENOUS | Status: AC
  Administered 2018-03-17: 2 mg via INTRAVENOUS

## 2018-03-17 MED ORDER — METOPROLOL TARTRATE 50 MG PO TABS
50.0000 mg | ORAL_TABLET | Freq: Two times a day (BID) | ORAL | Status: DC
  Administered 2018-03-17 – 2018-03-19 (×4): 50 mg via ORAL
  Filled 2018-03-17 (×4): qty 1

## 2018-03-17 MED ORDER — MORPHINE SULFATE (PF) 2 MG/ML IV SOLN
INTRAVENOUS | Status: AC
  Filled 2018-03-17: qty 1

## 2018-03-17 MED ORDER — CARVEDILOL 25 MG PO TABS
25.0000 mg | ORAL_TABLET | Freq: Two times a day (BID) | ORAL | Status: DC
  Administered 2018-03-17: 25 mg via ORAL
  Filled 2018-03-17: qty 1

## 2018-03-17 MED ORDER — ALUM & MAG HYDROXIDE-SIMETH 200-200-20 MG/5ML PO SUSP
15.0000 mL | ORAL | Status: DC | PRN
  Administered 2018-03-17: 15 mL via ORAL
  Filled 2018-03-17: qty 30

## 2018-03-17 MED ORDER — PERFLUTREN LIPID MICROSPHERE
1.0000 mL | INTRAVENOUS | Status: AC | PRN
  Administered 2018-03-17: 4 mL via INTRAVENOUS
  Filled 2018-03-17: qty 10

## 2018-03-17 MED ORDER — METOPROLOL TARTRATE 5 MG/5ML IV SOLN
5.0000 mg | Freq: Once | INTRAVENOUS | Status: AC
  Administered 2018-03-17: 5 mg via INTRAVENOUS

## 2018-03-17 MED ORDER — AMLODIPINE BESYLATE 5 MG PO TABS
5.0000 mg | ORAL_TABLET | Freq: Every day | ORAL | Status: DC
  Administered 2018-03-17 – 2018-03-19 (×3): 5 mg via ORAL
  Filled 2018-03-17 (×3): qty 1

## 2018-03-17 MED ORDER — METOPROLOL TARTRATE 5 MG/5ML IV SOLN
INTRAVENOUS | Status: AC
  Filled 2018-03-17: qty 5

## 2018-03-17 MED ORDER — ISOSORBIDE MONONITRATE ER 30 MG PO TB24
30.0000 mg | ORAL_TABLET | Freq: Every evening | ORAL | Status: DC
  Administered 2018-03-17 – 2018-03-18 (×2): 30 mg via ORAL
  Filled 2018-03-17 (×2): qty 1

## 2018-03-17 MED ORDER — ISOSORBIDE MONONITRATE ER 60 MG PO TB24
90.0000 mg | ORAL_TABLET | Freq: Every morning | ORAL | Status: DC
  Administered 2018-03-18 – 2018-03-19 (×2): 90 mg via ORAL
  Filled 2018-03-17 (×2): qty 1

## 2018-03-17 NOTE — Progress Notes (Signed)
CSW received call from pt dtr this morning she is now adamantly refusing for pt to go to [facility] and would like [place] explained that insurance takes usually 2-3 days to receive and that we would likely not hear about insurance before the weekend if we change facilities again- explained that hospital cannot keep patient pending auth to preferred facility and that they would need to either take patient home or pay privately for SNF until approval is received- dtr would still like to change choice to [facility] requested [name] initiated auth- they would require $8,215 up front for patient admission- csw informed dtr she will examine and plan to take patient home if they cannot afford this rate  Jorge Ny, [place] Social Worker [phone]

## 2018-03-17 NOTE — Progress Notes (Signed)
PT Cancellation Note  Patient Details Name: John Peck MRN: [id number] DOB: Apr 07, 1924   Cancelled Treatment:    Reason Eval/Treat Not Completed: (P) Patient at procedure or test/unavailable Pt having ECHO done. PT will follow back this afternoon.    [place] 03/17/2018, 11:54 AM

## 2018-03-17 NOTE — Progress Notes (Signed)
Pharmacy Antibiotic Note  John Peck is a 84 y.o. male admitted on 03/14/2018 with sepsis/pneumonia.  Pharmacy has been consulted for vancomycin/Zosyn dosing.   Cultures negative to date Scr = 1.56 Afebrile, WBC = 4.9  Plan: Continue  Zosyn 3.375 gm IV q8h (4 hour infusion) Continue vancomycin 1g IV Q24h  - Consider stopping? Day # 4    Height: 5\' 9"  (175.3 cm) Weight: 185 lb 3 oz (84 kg) IBW/kg (Calculated) : 70.7  Temp (24hrs), Avg:97.7 F (36.5 C), Min:97.5 F (36.4 C), Max:97.9 F (36.6 C)  Recent Labs  Lab 03/14/18 1023 03/14/18 1043 03/14/18 1212 03/14/18 1301 03/15/18 0354 03/16/18 0243 03/17/18 0219  WBC 2.7*  --   --   --   --  4.9  --   CREATININE  --   --  1.72*  --  1.63* 1.53* 1.56*  LATICACIDVEN  --  1.24  --  1.21  --   --   --     Estimated Creatinine Clearance: 30.8 mL/min (A) (by C-G formula based on SCr of 1.56 mg/dL (H)).    No Known Allergies  Antimicrobials this admission: 5/6 vancomycin >>  5/6 cefepime >> 5/7 5/7 Zosyn >>  Thank you Anette Guarneri, PharmD [phone] 03/17/2018 10:35 AM

## 2018-03-17 NOTE — Progress Notes (Signed)
Pt complained of chest pain requesting nitro around 2200, no PRNs for nitro. RN paged [facility] on-call [place]. Received orders for EKG, nitro, and trending troponins. EKG uploaded in [place]. RN gave nitro x2 with no relief. RN ordered maalox from General Standing Orders for possible indigestion, with no relief. MD paged again. Rapid response was notified. Received orders for morphine and lopressor for elevated HR and BP. Chest pain has now come down to 4/10 BP 164/101. CXR obtained and uploaded in [place]. Will continue to monitor.

## 2018-03-17 NOTE — Significant Event (Signed)
Rapid Response Event Note  Overview: Time Called: 0238 Arrival Time: 0242 Event Type: Cardiac, Respiratory  Initial Focused Assessment: 84 yo male with chest pain, SOB. Pt lying in bed in a semi-fowlers position.  Alert, oriented person, place and situation.  Pt c/o CP 8/10 substernal with radiation to the left side of his chest. Pt received NTG SL x 3 prior to my arrival.  Pt also c/o SOB with mild distress including abdominal accessory muscles and upper airway expiratory wheeze.  Skin is warm, pink and diaphoretic.  HR 114 ST, BP 178/108, RR 25 with sats 100% on 2L [place].  Afebrile.  Pt has a hx of afib and was reportedly in Afib with RVR earlier tonight.     Interventions: -Orders received for morphine and lopressor -PCXR  Plan of Care (if not transferred): -Monitor for further CP/SOB.  Call MD and RR is any further clinical decompensations.  Event Summary: Pt's CP decreasing following morphine to a 4/10 and coming down. Pt stated he was breathing easier. Pt looks to be in ST with 1st degree HB. HR 115, BP 160/100,  RR 18 sats 100%.   Madelynn Done

## 2018-03-17 NOTE — Progress Notes (Signed)
Physical Therapy Treatment Patient Details Name: John Peck MRN: [id number] DOB: 06/22/1924 Today's Date: 03/17/2018    History of Present Illness 84 y.o. male with a Past Medical History of afib not on AC due to GI bleeding; pacemaker placement; HTN; HLD: stage 3 CKD; CAD s/p CABG and PCI; R carotid stenosis; and chronic combined heart failure who presents with cough and SOB.  Patient was admitted from 4/16-18 for presumed CVA related to right ICA stenosis.      PT Comments    Pt with increased fatigue today limiting progress towards his goals. Pt currently limited by fatigue, decreased strength and endurance. Pt required modA for bed mobility, modA for transfers and minA for steadying with RW for ambulation of 30 feet. PT continues to recommend SNF level rehab at discharge to regain strength and endurance before ultimately returning home.      Follow Up Recommendations  SNF     Equipment Recommendations  None recommended by PT    Recommendations for Other Services       Precautions / Restrictions Precautions Precautions: Fall Restrictions Weight Bearing Restrictions: No    Mobility  Bed Mobility Overal bed mobility: Needs Assistance Bed Mobility: Supine to Sit;Sit to Supine     Supine to sit: Mod assist Sit to supine: Mod assist   General bed mobility comments: modA to elevate trunk and pad scoot hips to EoB, modA for managing LE into bd  Transfers Overall transfer level: Needs assistance Equipment used: Rolling walker (2 wheeled) Transfers: Sit to/from Stand Sit to Stand: Mod assist(to prevent fall)         General transfer comment: modA for powerup into standing and steadying with RW  Ambulation/Gait Ambulation/Gait assistance: Min assist Ambulation Distance (Feet): 30 Feet Assistive device: Rolling walker (2 wheeled) Gait Pattern/deviations: Step-through pattern;Decreased step length - right;Decreased step length - left;Shuffle;Trunk flexed Gait  velocity: slowed Gait velocity interpretation: <1.31 ft/sec, indicative of household ambulator General Gait Details: slow, shuffling gait with decreased foot clearance, pt with 3/4 DoE requiring short rest break once reaching the door vc for upright posture and proximity to RW       Balance Overall balance assessment: Needs assistance;History of Falls   Sitting balance-Leahy Scale: Fair       Standing balance-Leahy Scale: Poor Standing balance comment: reliant on external support                            Cognition Arousal/Alertness: Awake/alert Behavior During Therapy: WFL for tasks assessed/performed Overall Cognitive Status: History of cognitive impairments - at baseline Area of Impairment: Attention;Awareness;Problem solving;Safety/judgement                   Current Attention Level: Selective     Safety/Judgement: Decreased awareness of safety Awareness: Emergent Problem Solving: Slow processing;Requires verbal cues           General Comments General comments (skin integrity, edema, etc.): at rest HR 120 bpm, and SaO2 on 2L O2 98% O2, with ambulation max HR 132 bpm, SaO2 on 2L (#%O2      Pertinent Vitals/Pain Faces Pain Scale: No hurt           PT Goals (current goals can now be found in the care plan section) Acute Rehab PT Goals Patient Stated Goal: to go home PT Goal Formulation: With patient Time For Goal Achievement: 03/29/18 Potential to Achieve Goals: Good Progress towards PT goals: Not progressing toward goals -  comment(increased fatigue today with therapy)    Frequency    Min 3X/week      PT Plan Current plan remains appropriate       AM-PAC PT "6 Clicks" Daily Activity  Outcome Measure  Difficulty turning over in bed (including adjusting bedclothes, sheets and blankets)?: A Little Difficulty moving from lying on back to sitting on the side of the bed? : Unable Difficulty sitting down on and standing up from a  chair with arms (e.g., wheelchair, bedside commode, etc,.)?: Unable Help needed moving to and from a bed to chair (including a wheelchair)?: A Little Help needed walking in hospital room?: A Little Help needed climbing 3-5 steps with a railing? : Total 6 Click Score: 12    End of Session Equipment Utilized During Treatment: Gait belt;Oxygen Activity Tolerance: Patient limited by fatigue Patient left: in bed;with call bell/phone within reach;with bed alarm set;with family/visitor present Nurse Communication: Mobility status PT Visit Diagnosis: Unsteadiness on feet (R26.81);Other abnormalities of gait and mobility (R26.89)     Time: 6378-5885 PT Time Calculation (min) (ACUTE ONLY): 31 min  Charges:  $Gait Training: 8-22 mins $Therapeutic Activity: 8-22 mins                    G Codes:       [name] B. Migdalia Dk PT, DPT Acute Rehabilitation  [phone] Pager [phone]     [place] 03/17/2018, 3:13 PM

## 2018-03-17 NOTE — Progress Notes (Signed)
PROGRESS NOTE    John Peck  ZSW:[id number] DOB: Oct 02, 1924 DOA: 03/14/2018 PCP: Seward Carol, MD   Brief Narrative: Patient is a 84 year old male with past medical history of atrial fibrillation not on anticoagulant due to GI bleed, hypertension, hyperlipidemia, CKD stage III, coronary artery disease status post CABG, right carotid artery stenosis, pacemaker placement, combined systolic and diastolic CHF who presented to the hospital with complaints of shortness of breath and cough.  Patient was just admitted 2 weeks ago and was diagnosed with possible stroke related to right-sided ICA stenosis.  On this presentation, chest x-ray was done which showed concern for multifocal pneumonia.  Patient is currently treated with IV antibiotics.  Patient also developed chest pain last night.  Troponin found to be elevated.  Cardiology consulted.  Assessment & Plan:   Principal Problem:   HCAP (healthcare-associated pneumonia) Active Problems:   S/P CABG x 2 '89. RCA stent'04. last cath Jan 2013   Chest pain   Pacemaker   Chronic combined systolic and diastolic CHF (congestive heart failure) (HCC)   COPD (chronic obstructive pulmonary disease) ([place])   H/O: GI bleed   CKD (chronic kidney disease), stage III (HCC)   Anemia   Paroxysmal atrial fibrillation (HCC)   Hypothyroidism   Carotid stenosis, right   Bilateral conjunctivitis  Acute respiratory failure with hypoxia: Suspect healthcare respiratory pneumonia versus aspiration pneumonia. Respiratory status has improved.  Continue bronchodilators, supplemental oxygen as necessary.  Patient is not on oxygen at home.  We will continue to taper the oxygen.  Continue broad-spectrum antibiotics for now. We will change antibiotics to oral on DC. On vancomycin and Zosyn.  Cultures negative so far.  Patient is afebrile. Speech is following for possible aspiration pneumonia.  Recommended regular solids and thin liquid. He was also started on steroids  for wheezing.  We discontinued steroids today.  Chest Pain: He has extensive history of coronary artery disease.  Status post CABG. Complained of chest pain last night.  Troponin elevated and trended up.Cardiology consulted.  Echocardiogram ordered.  Chest pain has improved this morning.  Combined systolic/diastolic heart failure: Last echo showed ejection fraction of 40% with grade 2 diastolic dysfunction.  Currently euvolemic.  History of coronary artery disease: Status post CABG.  Continue aspirin, Plavix, Ranexa, statin. Not on ACE or ARB due to CKD.  Paroxysmal A. fib: Status post pacemaker.  Not on anti-Coagulation due to history of previous GI bleed.  Hypothyroidism: Continue Synthyroid.  History of BPH: Continue Flomax.  History of CVA: Was recently admitted here.  Has chronic right-sided ICA stenosis.  Patient was evaluated by PT/OT and was recommended skilled nursing facility but he currently wants to go home.  Social worker following.  History of CKD :H/O CKD stage 3.  Baseline creatinine ranges from 1.3-1.4. Was on acute kidney injury on presentation and was started on gentle IV fluids.Will stop IV fluids.   DVT prophylaxis: SCD Code Status: Full Family Communication: None present at the bedside Disposition Plan: Likely home with home health /SNF tomorrow   Consultants: None  Procedures:None  Antimicrobials: Vanco since 5/6 and Zosyn since 5/7  Subjective: Patient seen and examined the bedside this morning.  His respiratory status has improved.  Complains of chest pain last night.  Chest pain has improved this morning.  Objective: Vitals:   03/17/18 0346 03/17/18 0431 03/17/18 0642 03/17/18 0919  BP: (!) 160/100 (!) 167/106  (!) 145/100  Pulse:  (!) 117  (!) 120  Resp:    Marland Kitchen)  22  Temp:    97.9 F (36.6 C)  TempSrc:    Oral  SpO2:   99% 100%  Weight:      Height:        Intake/Output Summary (Last 24 hours) at 03/17/2018 1344 Last data filed at 03/17/2018  1011 Gross per 24 hour  Intake 7700 ml  Output 1850 ml  Net 5850 ml   Filed Weights   03/15/18 0034 03/15/18 0500 03/17/18 0304  Weight: 78.9 kg (173 lb 15.1 oz) 82 kg (180 lb 12.4 oz) 84 kg (185 lb 3 oz)    Examination:  General exam: Appears calm and comfortable ,Not in distress,obese HEENT:PERRL,Oral mucosa moist, Ear/Nose normal on gross exam Respiratory system: Bilateral decreased air entry, coarse breathing sounds Cardiovascular system: S1 & S2 heard, RRR. No JVD, murmurs, rubs, gallops or clicks. Gastrointestinal system: Abdomen is nondistended, soft and nontender. No organomegaly or masses felt. Normal bowel sounds heard. Central nervous system: Alert and oriented. No focal neurological deficits. Extremities: No edema, no clubbing ,no cyanosis, distal peripheral pulses palpable. Skin: No rashes, lesions or ulcers,no icterus ,no pallor    Data reviewed: I have personally reviewed following labs and imaging studies  CBC: Recent Labs  Lab 03/14/18 1023 03/16/18 0243  WBC 2.7* 4.9  NEUTROABS 1.9  --   HGB 9.9* 9.0*  HCT 29.7* 27.1*  MCV 97.7 94.4  PLT 81* 91*   Basic Metabolic Panel: Recent Labs  Lab 03/14/18 1212 03/15/18 0354 03/16/18 0243 03/17/18 0219  NA 134* 137 136 134*  K 3.7 4.4 4.2 4.8  CL 101 107 108 106  CO2 23 20* 20* 19*  GLUCOSE 101* 120* 147* 195*  BUN 26* 29* 36* 36*  CREATININE 1.72* 1.63* 1.53* 1.56*  CALCIUM 8.1* 8.0* 8.1* 8.4*  MG  --   --  1.9  --    GFR: Estimated Creatinine Clearance: 30.8 mL/min (A) (by C-G formula based on SCr of 1.56 mg/dL (H)). Liver Function Tests: Recent Labs  Lab 03/14/18 1212 03/15/18 0354  AST 15 39  ALT 24 43  ALKPHOS 47 52  BILITOT 1.6* 1.6*  PROT 5.9* 5.9*  ALBUMIN 2.7* 2.5*   No results for input(s): LIPASE, AMYLASE in the last 168 hours. No results for input(s): AMMONIA in the last 168 hours. Coagulation Profile: Recent Labs  Lab 03/14/18 1023  INR 1.40   Cardiac Enzymes: Recent  Labs  Lab 03/16/18 2030 03/17/18 0219 03/17/18 0752  TROPONINI 0.19* 0.19* 0.68*   BNP (last 3 results) No results for input(s): PROBNP in the last 8760 hours. HbA1C: No results for input(s): HGBA1C in the last 72 hours. CBG: No results for input(s): GLUCAP in the last 168 hours. Lipid Profile: No results for input(s): CHOL, HDL, LDLCALC, TRIG, CHOLHDL, LDLDIRECT in the last 72 hours. Thyroid Function Tests: No results for input(s): TSH, T4TOTAL, FREET4, T3FREE, THYROIDAB in the last 72 hours. Anemia Panel: No results for input(s): VITAMINB12, FOLATE, FERRITIN, TIBC, IRON, RETICCTPCT in the last 72 hours. Sepsis Labs: Recent Labs  Lab 03/14/18 1043 03/14/18 1301 03/14/18 1619 03/15/18 0354 03/16/18 0243  PROCALCITON  --   --  4.48 5.46 5.15  LATICACIDVEN 1.24 1.21  --   --   --     Recent Results (from the past 240 hour(s))  Culture, blood (Routine x 2)     Status: None (Preliminary result)   Collection Time: 03/14/18 10:23 AM  Result Value Ref Range Status   Specimen Description BLOOD BLOOD RIGHT  WRIST  Final   Special Requests   Final    BOTTLES DRAWN AEROBIC AND ANAEROBIC Blood Culture adequate volume   Culture   Final    NO GROWTH 3 DAYS Performed at [facility], [address], [place], [place] [address]    Report Status PENDING  Incomplete  Culture, blood (Routine x 2)     Status: None (Preliminary result)   Collection Time: 03/14/18 10:28 AM  Result Value Ref Range Status   Specimen Description BLOOD LEFT ANTECUBITAL  Final   Special Requests   Final    BOTTLES DRAWN AEROBIC AND ANAEROBIC Blood Culture adequate volume   Culture   Final    NO GROWTH 3 DAYS Performed at [facility], [place] [address], [place], [place] [address]    Report Status PENDING  Incomplete  Respiratory Panel by PCR     Status: None   Collection Time: 03/14/18 11:32 AM  Result Value Ref Range Status   Adenovirus NOT DETECTED NOT DETECTED Final   Coronavirus 229E NOT  DETECTED NOT DETECTED Final   Coronavirus HKU1 NOT DETECTED NOT DETECTED Final   Coronavirus NL63 NOT DETECTED NOT DETECTED Final   Coronavirus OC43 NOT DETECTED NOT DETECTED Final   Metapneumovirus NOT DETECTED NOT DETECTED Final   Rhinovirus / Enterovirus NOT DETECTED NOT DETECTED Final   Influenza A NOT DETECTED NOT DETECTED Final   Influenza B NOT DETECTED NOT DETECTED Final   Parainfluenza Virus 1 NOT DETECTED NOT DETECTED Final   Parainfluenza Virus 2 NOT DETECTED NOT DETECTED Final   Parainfluenza Virus 3 NOT DETECTED NOT DETECTED Final   Parainfluenza Virus 4 NOT DETECTED NOT DETECTED Final   Respiratory Syncytial Virus NOT DETECTED NOT DETECTED Final   Bordetella pertussis NOT DETECTED NOT DETECTED Final   Chlamydophila pneumoniae NOT DETECTED NOT DETECTED Final   Mycoplasma pneumoniae NOT DETECTED NOT DETECTED Final    Comment: Performed at [facility], [place] [address] [place], [place] [address]         Radiology Studies: Dg Chest Port 1 View  Result Date: 03/17/2018 CLINICAL DATA:  84 year old male with acute respiratory distress EXAM: PORTABLE CHEST 1 VIEW COMPARISON:  Chest radiograph dated 03/14/2018 FINDINGS: There is mild underlying interstitial edema. Bilateral streaky and hazy interstitial and airspace densities primarily involving the right upper lung field and left lung base may represent edema or pneumonia. Clinical correlation is recommended. There is a small left pleural effusion. No pneumothorax. Stable cardiac silhouette. Median sternotomy wires and CABG vascular clips. Left pectoral pacemaker device. No acute osseous pathology. IMPRESSION: Mild interstitial edema. Areas of streaky density in the right upper lobe and left lung base may represent superimposed pneumonia. Overall no significant interval change compared to prior radiograph. Small left pleural effusion. Electronically Signed   By: Anner Crete M.D.   On: 03/17/2018 03:58   Dg Swallowing  Func-speech Pathology  Result Date: 03/17/2018 Objective Swallowing Evaluation: Type of Study: MBS-Modified Barium Swallow Study  Patient Details Name: John Peck MRN: [id number] Date of Birth: 06-May-1924 Today's Date: 03/17/2018 Time: SLP Start Time (ACUTE ONLY): 2878 -SLP Stop Time (ACUTE ONLY): 1059 SLP Time Calculation (min) (ACUTE ONLY): 10 min Past Medical History: Past Medical History: Diagnosis Date . Anemia  . Arthritis  . CAD (coronary artery disease)   a. CABG '89; b. PCI '04; c. 11/2015 Cath/PCI: LM 20ost, LAD 118m, LCX 30p, OM2 40, RCA 30p, 10p ISR, 90d (3.0x12 Synergy DES), AM 90, VG->OM2  known to be 100, LIMA->LAD not injected, patent in 2015. . Carotid stenosis, right  . Chronic combined systolic and diastolic CHF (congestive heart failure) ([place])  . CKD (chronic kidney disease), stage III ([place])  . H/O: GI bleed   REMOTE HISTORY . History of COPD  . Hyperlipidemia  . Hypertensive heart disease  . LBBB (left bundle branch block)  . Pacemaker Sept 2009  St Jude . PAF (paroxysmal atrial fibrillation) (HCC)   a. not on anticoag due to prior GIB. . Sick sinus syndrome [facility]) Sept 2009  ST Jude PTVDP Past Surgical History: Past Surgical History: Procedure Laterality Date . CARDIAC CATHETERIZATION  11/2011  EF 50%; significant native CAD w/80% stenosis of the LAD after 2nd giagonal vessel and septal perforating artery w/total occlusion of the mid left anterior descendiung.; 60-70% ostiasl stenosis in the circumflex vessel followed by 40% proximal stenosis and 70-80% distal circumflex stenosis;  . CARDIAC CATHETERIZATION N/A 11/29/2015  Procedure: Left Heart Cath and Cors/Grafts Angiography;  Surgeon: Burnell Blanks, MD;  Location: [place] [facility] LAB;  Service: Cardiovascular;  Laterality: N/A; . CARDIAC CATHETERIZATION N/A 11/29/2015  Procedure: Coronary Stent Intervention;  Surgeon: Burnell Blanks, MD;  Location: [place] [facility] LAB;  Service: Cardiovascular;  Laterality: N/A; . CATARACT  EXTRACTION  2004 . CORONARY ANGIOGRAPHY N/A 02/25/2017  Procedure: Coronary Angiography;  Surgeon: Belva Crome, MD;  Location: [place] [facility] LAB;  Service: Cardiovascular;  Laterality: N/A; . CORONARY ANGIOPLASTY WITH STENT PLACEMENT  2004  RCA . CORONARY ARTERY BYPASS GRAFT  1989  had LIMA to his LAD, a vein to the circumflex. In 2004 underwent stenting to his right coronary artery. . CORONARY STENT INTERVENTION N/A 02/24/2017  Procedure: Coronary Stent Intervention;  Surgeon: Wellington Hampshire, MD;  Location: [place] [facility];  Service: Cardiovascular;  Laterality: N/A;  cfx . HEMORRHOID SURGERY   . INSERT / REPLACE / REMOVE PACEMAKER  07/17/08  DUAL-CHAMBER; PPM-ST.JUDE [facility] . LEFT HEART CATH AND CORS/GRAFTS ANGIOGRAPHY N/A 02/24/2017  Procedure: Left Heart Cath and Cors/Grafts Angiography;  Surgeon: Wellington Hampshire, MD;  Location: [place] [facility];  Service: Cardiovascular;  Laterality: N/A; . LEFT HEART CATHETERIZATION WITH CORONARY/GRAFT ANGIOGRAM N/A 12/10/2011  Procedure: LEFT HEART CATHETERIZATION WITH Beatrix Fetters;  Surgeon: Troy Sine, MD;  Location: [facility] CATH LAB;  Service: Cardiovascular;  Laterality: N/A; . LEFT HEART CATHETERIZATION WITH CORONARY/GRAFT ANGIOGRAM N/A 01/26/2014  Procedure: LEFT HEART CATHETERIZATION WITH Beatrix Fetters;  Surgeon: Troy Sine, MD;  Location: [facility] CATH LAB;  Service: Cardiovascular;  Laterality: N/A; . PPM GENERATOR CHANGEOUT N/A 12/15/2017  Procedure: PPM GENERATOR CHANGEOUT;  Surgeon: Evans Lance, MD;  Location: [place] CV LAB;  Service: Cardiovascular;  Laterality: N/A; . PROSTATECTOMY   HPI: Pt is a 84 y.o. male admitted with sepsis secondary to PNA. He had a recent admission 4/16-4/18 for CVA, at which time he passed the RN stroke swallow screen. Prior swallowing evaluation was completed in July 2016 with recommendation for regular textures and thin liquids. CT Neck in April 2019 showed advanced cervical spondylosis, findings of  DISH. PMH also includes: afib not on AC due to GI bleeding; pacemaker placement; HTN; HLD: stage 3 CKD; CAD s/p CABG and PCI; R carotid stenosis; and chronic combined heart failure  Subjective: pt describes dysphagia including globus sensation, coughing with intake that has been ongoing for the past year or so Assessment / Plan / Recommendation CHL IP CLINICAL IMPRESSIONS 03/16/2018 Clinical Impression Pt has a mild oropharyngeal  dysphagia characterized by lingual pumping and weak lingual manipulation for A/P transit. Pt was not able to clear the barium tablet from his mouth with liquids despite multiple boluses, ultimately needing a bite of puree. Swallow initiation is intermittently delayed, with boluses resting primarily in the valleculae prior to swallow trigger. Changes to the cervical spine as described on CT Neck were noted but did not impede bolus flow. Pt had adequate airway protection with only intermittent, trace penetration with thin liquids that cleared spontaneously upon completion of the swallow. Recommend that he initiate regular diet textures and thin liquids; meds whole in puree. SLP will f/u briefly for tolerance given concern for PNA.  SLP Visit Diagnosis Dysphagia, oropharyngeal phase (R13.12) Attention and concentration deficit following -- Frontal lobe and executive function deficit following -- Impact on safety and function --   CHL IP TREATMENT RECOMMENDATION 03/15/2018 Treatment Recommendations Therapy as outlined in treatment plan below   Prognosis 03/15/2018 Prognosis for Safe Diet Advancement Good Barriers to Reach Goals -- Barriers/Prognosis Comment -- CHL IP DIET RECOMMENDATION 03/16/2018 SLP Diet Recommendations -- Liquid Administration via -- Medication Administration -- Compensations Slow rate;Small sips/bites Postural Changes --   CHL IP OTHER RECOMMENDATIONS 03/15/2018 Recommended Consults -- Oral Care Recommendations Oral care BID Other Recommendations --   CHL IP FOLLOW UP RECOMMENDATIONS  03/16/2018 Follow up Recommendations None   CHL IP FREQUENCY AND DURATION 03/15/2018 Speech Therapy Frequency (ACUTE ONLY) min 1 x/week Treatment Duration 1 week      CHL IP ORAL PHASE 03/15/2018 Oral Phase Impaired Oral - Pudding Teaspoon -- Oral - Pudding Cup -- Oral - Honey Teaspoon -- Oral - Honey Cup -- Oral - Nectar Teaspoon -- Oral - Nectar Cup -- Oral - Nectar Straw -- Oral - Thin Teaspoon -- Oral - Thin Cup Lingual pumping;Delayed oral transit Oral - Thin Straw Lingual pumping;Delayed oral transit Oral - Puree Lingual pumping;Delayed oral transit Oral - Mech Soft Lingual pumping;Delayed oral transit Oral - Regular -- Oral - Multi-Consistency -- Oral - Pill Lingual pumping;Delayed oral transit Oral Phase - Comment --  CHL IP PHARYNGEAL PHASE 03/15/2018 Pharyngeal Phase Impaired Pharyngeal- Pudding Teaspoon -- Pharyngeal -- Pharyngeal- Pudding Cup -- Pharyngeal -- Pharyngeal- Honey Teaspoon -- Pharyngeal -- Pharyngeal- Honey Cup -- Pharyngeal -- Pharyngeal- Nectar Teaspoon -- Pharyngeal -- Pharyngeal- Nectar Cup -- Pharyngeal -- Pharyngeal- Nectar Straw -- Pharyngeal -- Pharyngeal- Thin Teaspoon -- Pharyngeal -- Pharyngeal- Thin Cup Delayed swallow initiation-vallecula Pharyngeal -- Pharyngeal- Thin Straw Delayed swallow initiation-vallecula Pharyngeal -- Pharyngeal- Puree Delayed swallow initiation-vallecula Pharyngeal -- Pharyngeal- Mechanical Soft Delayed swallow initiation-vallecula Pharyngeal -- Pharyngeal- Regular -- Pharyngeal -- Pharyngeal- Multi-consistency -- Pharyngeal -- Pharyngeal- Pill WFL Pharyngeal -- Pharyngeal Comment --  CHL IP CERVICAL ESOPHAGEAL PHASE 03/15/2018 Cervical Esophageal Phase WFL Pudding Teaspoon -- Pudding Cup -- Honey Teaspoon -- Honey Cup -- Nectar Teaspoon -- Nectar Cup -- Nectar Straw -- Thin Teaspoon -- Thin Cup -- Thin Straw -- Puree -- Mechanical Soft -- Regular -- Multi-consistency -- Pill -- Cervical Esophageal Comment -- No flowsheet data found. Germain Osgood 03/17/2018,  8:06 AM  Germain Osgood, M.A. CCC-SLP [phone]                  Scheduled Meds: . aspirin EC  81 mg Oral Daily  . atorvastatin  40 mg Oral Daily  . carvedilol  25 mg Oral BID WC  . clopidogrel  75 mg Oral Q1500  . ezetimibe  10 mg Oral Daily  . latanoprost  1 drop Both  Eyes Once per day on Mon Thu  . levothyroxine  125 mcg Oral QAC breakfast  . ranolazine  1,000 mg Oral BID  . sodium chloride flush  3 mL Intravenous Q12H  . tamsulosin  0.4 mg Oral Daily  . tobramycin  2 drop Both Eyes Q6H   Continuous Infusions: . piperacillin-tazobactam (ZOSYN)  IV 3.375 g (03/17/18 1011)  . vancomycin Stopped (03/16/18 1922)     LOS: 3 days    Time spent: 25 mins.More than 50% of that time was spent in counseling and/or coordination of care.      Shelly Coss, MD [facility] Pager [phone]  If 7PM-7AM, please contact night-coverage [email or web address] Password [email or web address] 03/17/2018, 1:44 PM

## 2018-03-17 NOTE — Progress Notes (Signed)
Pt's family notified nurse secretary that pt was having chest pain. Pt stated pain was a 4/10. RN administered PRN nitroglycerin and obtained EKG. EKG showed sinus tach and after completing EKG, pt stated pain had resided and rated it a 1/10. Will continue to monitor pt.

## 2018-03-17 NOTE — Consult Note (Signed)
Cardiology Consultation:   Patient ID: [id number] [id number]; [id number]; [birth year]   Admit date: 03/14/2018 Date of Consult: 03/17/2018  Primary Care Provider: Seward Carol, MD Primary Cardiologist: Shelva Majestic, MD  Primary Electrophysiologist:  Dr. Lovena Le    Patient Profile:   John Peck is a 84 y.o. male with a hx of CAD s/p CABG and subsequent PCI, PAD, chronic combined CHF,  IDA, HTN, BPH, COPD, CKD stage III, SSS s/p PPM and recent admission for possible stroke who is being seen today for the evaluation of chest pain  at the request of Dr. Tawanna Solo.   Last cardiac catheterization on 02/24/2017 by Dr. Audelia Acton for unstable angina.  His LIMA to LAD was patent.  The vein graft that had supplied the circumflex marginal vessel was occluded and he had 80% ostial circumflex stenosis.  The stents in his RCA proximally and distally were patent.  A Resolute 2.518 mm stent was successfully placed at the circumflex ostium.  He subsequently developed an elevated troponin level and had chest pain in the following day was taken back to the laboratory and repeat catheterization by Dr. Tamala Julian showed the ostial stent site to be widely patent and no new significant changes from the calf the preceding day.  He has experienced occasional episodes of chest pain since his hospital discharge, which have been nitrate responsive.  His pacemaker reached ERI and he underwent a generator change out by Dr. Lovena Le on 12/15/2017  Last seen by Dr. Claiborne Billings 01/17/18.  Admitted 02/2018 for possible stroke. He was seen by the stroke service and they felt he was suffering from symptomatic hypotension.  His carotid ultrasound showed a very high bifurcation with tortuous vessels. Plan for endarterectomy versus carotid stenting in near futhere.   History of Present Illness:   John Peck admitted 03/14/18 for multifocal pneumonia with hypoxia and respiratory failure. Treated with abx. Last night had sudden onset of substernal chest  pressure resolved with SL nitro. Lasted for 5 hours. Described as substernal chest pressure/stabbing. Similar to prior angina.  No reoccurrence. Troponin trending up. EKG showed new TWI laterally however not pacing. Denies orthopnea or PND. Imdur and coreg was held on admission due to soft BP. Coreg resume today. HR elevated to 110s for past 48 hours. Echo done pending reading.   Past Medical History:  Diagnosis Date  . Anemia   . Arthritis   . CAD (coronary artery disease)    a. CABG '89; b. PCI '04; c. 11/2015 Cath/PCI: LM 20ost, LAD 1103m, LCX 30p, OM2 40, RCA 30p, 10p ISR, 90d (3.0x12 Synergy DES), AM 90, VG->OM2 known to be 100, LIMA->LAD not injected, patent in 2015.  . Carotid stenosis, right   . Chronic combined systolic and diastolic CHF (congestive heart failure) ([place])   . CKD (chronic kidney disease), stage III ([place])   . H/O: GI bleed    REMOTE HISTORY  . History of COPD   . Hyperlipidemia   . Hypertensive heart disease   . LBBB (left bundle branch block)   . Pacemaker Sept 2009   St Jude  . PAF (paroxysmal atrial fibrillation) (HCC)    a. not on anticoag due to prior GIB.  . Sick sinus syndrome [facility]) Sept 2009   ST Jude PTVDP    Past Surgical History:  Procedure Laterality Date  . CARDIAC CATHETERIZATION  11/2011   EF 50%; significant native CAD w/80% stenosis of the LAD after 2nd giagonal vessel and septal perforating artery w/total occlusion of the mid  left anterior descendiung.; 60-70% ostiasl stenosis in the circumflex vessel followed by 40% proximal stenosis and 70-80% distal circumflex stenosis;   . CARDIAC CATHETERIZATION N/A 11/29/2015   Procedure: Left Heart Cath and Cors/Grafts Angiography;  Surgeon: Burnell Blanks, MD;  Location: [place] CV LAB;  Service: Cardiovascular;  Laterality: N/A;  . CARDIAC CATHETERIZATION N/A 11/29/2015   Procedure: Coronary Stent Intervention;  Surgeon: Burnell Blanks, MD;  Location: [place] CV LAB;  Service:  Cardiovascular;  Laterality: N/A;  . CATARACT EXTRACTION  2004  . CORONARY ANGIOGRAPHY N/A 02/25/2017   Procedure: Coronary Angiography;  Surgeon: Belva Crome, MD;  Location: [place] CV LAB;  Service: Cardiovascular;  Laterality: N/A;  . CORONARY ANGIOPLASTY WITH STENT PLACEMENT  2004   RCA  . CORONARY ARTERY BYPASS GRAFT  1989   had LIMA to his LAD, a vein to the circumflex. In 2004 underwent stenting to his right coronary artery.  . CORONARY STENT INTERVENTION N/A 02/24/2017   Procedure: Coronary Stent Intervention;  Surgeon: Wellington Hampshire, MD;  Location: [place] CV LAB;  Service: Cardiovascular;  Laterality: N/A;  cfx  . HEMORRHOID SURGERY    . INSERT / REPLACE / REMOVE PACEMAKER  07/17/08   DUAL-CHAMBER; PPM-ST.JUDE MEDNET  . LEFT HEART CATH AND CORS/GRAFTS ANGIOGRAPHY N/A 02/24/2017   Procedure: Left Heart Cath and Cors/Grafts Angiography;  Surgeon: Wellington Hampshire, MD;  Location: [place] CV LAB;  Service: Cardiovascular;  Laterality: N/A;  . LEFT HEART CATHETERIZATION WITH CORONARY/GRAFT ANGIOGRAM N/A 12/10/2011   Procedure: LEFT HEART CATHETERIZATION WITH Beatrix Fetters;  Surgeon: Troy Sine, MD;  Location: [facility] CATH LAB;  Service: Cardiovascular;  Laterality: N/A;  . LEFT HEART CATHETERIZATION WITH CORONARY/GRAFT ANGIOGRAM N/A 01/26/2014   Procedure: LEFT HEART CATHETERIZATION WITH Beatrix Fetters;  Surgeon: Troy Sine, MD;  Location: [facility] CATH LAB;  Service: Cardiovascular;  Laterality: N/A;  . PPM GENERATOR CHANGEOUT N/A 12/15/2017   Procedure: PPM GENERATOR CHANGEOUT;  Surgeon: Evans Lance, MD;  Location: [place] CV LAB;  Service: Cardiovascular;  Laterality: N/A;  . PROSTATECTOMY       Inpatient Medications: Scheduled Meds: . aspirin EC  81 mg Oral Daily  . atorvastatin  40 mg Oral Daily  . carvedilol  25 mg Oral BID WC  . clopidogrel  75 mg Oral Q1500  . ezetimibe  10 mg Oral Daily  . latanoprost  1 drop Both Eyes Once per day on Mon  Thu  . levothyroxine  125 mcg Oral QAC breakfast  . ranolazine  1,000 mg Oral BID  . sodium chloride flush  3 mL Intravenous Q12H  . tamsulosin  0.4 mg Oral Daily  . tobramycin  2 drop Both Eyes Q6H   Continuous Infusions: . piperacillin-tazobactam (ZOSYN)  IV 3.375 g (03/17/18 1011)  . vancomycin Stopped (03/16/18 1922)   PRN Meds: acetaminophen **OR** acetaminophen, albuterol, alum & mag hydroxide-simeth, guaiFENesin-dextromethorphan, nitroGLYCERIN, ondansetron **OR** ondansetron (ZOFRAN) IV  Allergies:   No Known Allergies  Social History:   Social History   Socioeconomic History  . Marital status: Married    Spouse name: Not on file  . Number of children: 5  . Years of education: Not on file  . Highest education level: Not on file  Occupational History  . Occupation: RETIRED    Employer: RETIRED    Comment: [employer]  . Financial resource strain: Not on file  . Food insecurity:    Worry: Not on file  Inability: Not on file  . Transportation needs:    Medical: Not on file    Non-medical: Not on file  Tobacco Use  . Smoking status: Former Smoker    Packs/day: 2.00    Years: 65.00    Pack years: 130.00    Types: Cigarettes  . Smokeless tobacco: Former Systems developer    Quit date: 10/29/2008  Substance and Sexual Activity  . Alcohol use: No  . Drug use: No  . Sexual activity: Not on file  Lifestyle  . Physical activity:    Days per week: Not on file    Minutes per session: Not on file  . Stress: Not on file  Relationships  . Social connections:    Talks on phone: Not on file    Gets together: Not on file    Attends religious service: Not on file    Active member of club or organization: Not on file    Attends meetings of clubs or organizations: Not on file    Relationship status: Not on file  . Intimate partner violence:    Fear of current or ex partner: Not on file    Emotionally abused: Not on file    Physically abused: Not on file     Forced sexual activity: Not on file  Other Topics Concern  . Not on file  Social History Narrative  . Not on file    Family History:   Family History  Problem Relation Age of Onset  . Diabetes Father   . Heart disease Sister   . Heart disease Brother   . Heart attack Brother   . Stroke Brother   . Hypertension Neg Hx      ROS:  Please see the history of present illness.  All other ROS reviewed and negative.     Physical Exam/Data:   Vitals:   03/17/18 0346 03/17/18 0431 03/17/18 0642 03/17/18 0919  BP: (!) 160/100 (!) 167/106  (!) 145/100  Pulse:  (!) 117  (!) 120  Resp:    (!) 22  Temp:    97.9 F (36.6 C)  TempSrc:    Oral  SpO2:   99% 100%  Weight:      Height:        Intake/Output Summary (Last 24 hours) at 03/17/2018 1522 Last data filed at 03/17/2018 1011 Gross per 24 hour  Intake 7700 ml  Output 1850 ml  Net 5850 ml   Filed Weights   03/15/18 0034 03/15/18 0500 03/17/18 0304  Weight: 173 lb 15.1 oz (78.9 kg) 180 lb 12.4 oz (82 kg) 185 lb 3 oz (84 kg)   Body mass index is 27.35 kg/m.  General:  Ill appearing male , in no acute distress HEENT: normal Lymph: no adenopathy Neck: no JVD Endocrine:  No thryomegaly Vascular: No carotid bruits; FA pulses 2+ bilaterally without bruits  Cardiac:  normal S1, S2; RRR; no murmur Lungs:  clear to auscultation bilaterally, no wheezing, rhonchi or rales  Abd: soft, nontender, no hepatomegaly  Ext: no edema Musculoskeletal:  No deformities, BUE and BLE strength normal and equal Skin: warm and dry  Neuro:  CNs 2-12 intact, no focal abnormalities noted Psych:  Normal affect   EKG:  The EKG was personally reviewed and demonstrates:  Atrial fibrillation at rate of 107 bpm with TWI anteriorlaterally Telemetry:  Telemetry was personally reviewed and demonstrates:  afib at rate of 110s with intermittent pacing   Relevant CV Studies:  Coronary Angiography  02/25/17  Conclusion    Complicated coronary only procedure  due to marked tortuosity within the subclavian and innominate artery preventing catheter control and advancement.  Right coronary is unchanged.  The native left coronary demonstrates wide patency of the stent implanted yesterday.  Chest pain and elevated troponin will need to be further investigated. It is possible that the elevated troponin is a residual of the PCI performed yesterday. The current chest pain complaint is vague and according to the patient, present for quite some time before admission to the hospital.   Coronary Stent Intervention  02/24/17  Left Heart Cath and Cors/Grafts Angiography  Conclusion     Ost LM to LM lesion, 20 %stenosed.  2nd Mrg lesion, 40 %stenosed.  Mid LAD lesion, 100 %stenosed.  LIMA.  Origin lesion, 100 %stenosed.  Acute Mrg lesion, 90 %stenosed.  Prox RCA-2 lesion, 10 %stenosed.  Dist RCA lesion, 0 %stenosed.  A drug eluting .  Prox RCA-1 lesion, 40 %stenosed.  Ost LAD lesion, 50 %stenosed.  Mid Cx lesion, 30 %stenosed.  Ost 1st Diag to 1st Diag lesion, 30 %stenosed.  There is mild left ventricular systolic dysfunction.  LV end diastolic pressure is mildly elevated.  The left ventricular ejection fraction is 45-50% by visual estimate.  A STENT RESOLUTE ONYX 2.5X18 drug eluting stent was successfully placed.  Prox Cx lesion, 80 %stenosed.  Post intervention, there is a 0% residual stenosis.   1. Significant native three-vessel coronary artery disease with occluded mid LAD. Patent LIMA to LAD and patent stents in the right coronary artery. Progression of proximal left circumflex disease with known occluded SVG graft to OM. 2. Mildly reduced LV systolic function and mildly elevated left ventricular end-diastolic pressure. 3. Successful angioplasty and drug-eluting stent placement to the proximal left circumflex extending all the way back to the ostium.  Recommendations: Dual antiplatelet therapy for at least one year and  preferably indefinitely. The patient was noted to have episodes of paroxysmal tachycardia throughout the case likely atrial fibrillation versus atrial tachycardia with a heart rate up to 110 bpm. This might be also responsible for some of his anginal symptoms. I switched amlodipine to diltiazem for better rate control.  Difficult cath via the left radial artery due to left radial loop and left subclavian artery tortuosity.     Laboratory Data:  Chemistry Recent Labs  Lab 03/15/18 0354 03/16/18 0243 03/17/18 0219  NA 137 136 134*  K 4.4 4.2 4.8  CL 107 108 106  CO2 20* 20* 19*  GLUCOSE 120* 147* 195*  BUN 29* 36* 36*  CREATININE 1.63* 1.53* 1.56*  CALCIUM 8.0* 8.1* 8.4*  GFRNONAA 35* 38* 37*  GFRAA 41* 44* 43*  ANIONGAP 10 8 9     Recent Labs  Lab 03/14/18 1212 03/15/18 0354  PROT 5.9* 5.9*  ALBUMIN 2.7* 2.5*  AST 15 39  ALT 24 43  ALKPHOS 47 52  BILITOT 1.6* 1.6*   Hematology Recent Labs  Lab 03/14/18 1023 03/16/18 0243  WBC 2.7* 4.9  RBC 3.04* 2.87*  HGB 9.9* 9.0*  HCT 29.7* 27.1*  MCV 97.7 94.4  MCH 32.6 31.4  MCHC 33.3 33.2  RDW 15.4 15.6*  PLT 81* 91*   Cardiac Enzymes Recent Labs  Lab 03/16/18 2030 03/17/18 0219 03/17/18 0752  TROPONINI 0.19* 0.19* 0.68*   No results for input(s): TROPIPOC in the last 168 hours.  BNP Recent Labs  Lab 03/14/18 1023  BNP 500.7*    Radiology/Studies:  Dg Chest [facility]  Result Date: 03/17/2018 CLINICAL DATA:  84 year old male with acute respiratory distress EXAM: PORTABLE CHEST 1 VIEW COMPARISON:  Chest radiograph dated 03/14/2018 FINDINGS: There is mild underlying interstitial edema. Bilateral streaky and hazy interstitial and airspace densities primarily involving the right upper lung field and left lung base may represent edema or pneumonia. Clinical correlation is recommended. There is a small left pleural effusion. No pneumothorax. Stable cardiac silhouette. Median sternotomy wires and CABG vascular clips.  Left pectoral pacemaker device. No acute osseous pathology. IMPRESSION: Mild interstitial edema. Areas of streaky density in the right upper lobe and left lung base may represent superimposed pneumonia. Overall no significant interval change compared to prior radiograph. Small left pleural effusion. Electronically Signed   By: Anner Crete M.D.   On: 03/17/2018 03:58   Dg Chest Portable 1 View  Result Date: 03/14/2018 CLINICAL DATA:  History of CHF.  Cough. EXAM: PORTABLE CHEST 1 VIEW COMPARISON:  February 22, 2018 FINDINGS: Stable cardiomegaly. Mild bilateral pulmonary opacities are somewhat patchy in appearance but more focal in the right suprahilar region and left base. The hila and mediastinum are stable. No pneumothorax. No other acute abnormalities. IMPRESSION: 1. Bilateral pulmonary opacities are somewhat patchy in appearance, more focal in the right suprahilar region and left base. While asymmetric edema is possible, the patchy nature suggests the possibility of multifocal pneumonia. Recommend clinical correlation and follow-up to resolution. Electronically Signed   By: Dorise Bullion III M.D   On: 03/14/2018 10:39   Dg Swallowing Func-speech Pathology  Result Date: 03/17/2018 Objective Swallowing Evaluation: Type of Study: MBS-Modified Barium Swallow Study  Patient Details Name: EBUBECHUKWU JEDLICKA MRN: [id number] Date of Birth: Nov 13, 1923 Today's Date: 03/17/2018 Time: SLP Start Time (ACUTE ONLY): 3474 -SLP Stop Time (ACUTE ONLY): 1059 SLP Time Calculation (min) (ACUTE ONLY): 10 min Past Medical History: Past Medical History: Diagnosis Date . Anemia  . Arthritis  . CAD (coronary artery disease)   a. CABG '89; b. PCI '04; c. 11/2015 Cath/PCI: LM 20ost, LAD 124m, LCX 30p, OM2 40, RCA 30p, 10p ISR, 90d (3.0x12 Synergy DES), AM 90, VG->OM2 known to be 100, LIMA->LAD not injected, patent in 2015. . Carotid stenosis, right  . Chronic combined systolic and diastolic CHF (congestive heart failure) ([place])  . CKD  (chronic kidney disease), stage III ([place])  . H/O: GI bleed   REMOTE HISTORY . History of COPD  . Hyperlipidemia  . Hypertensive heart disease  . LBBB (left bundle branch block)  . Pacemaker Sept 2009  St Jude . PAF (paroxysmal atrial fibrillation) (HCC)   a. not on anticoag due to prior GIB. . Sick sinus syndrome [facility]) Sept 2009  ST Jude PTVDP Past Surgical History: Past Surgical History: Procedure Laterality Date . CARDIAC CATHETERIZATION  11/2011  EF 50%; significant native CAD w/80% stenosis of the LAD after 2nd giagonal vessel and septal perforating artery w/total occlusion of the mid left anterior descendiung.; 60-70% ostiasl stenosis in the circumflex vessel followed by 40% proximal stenosis and 70-80% distal circumflex stenosis;  . CARDIAC CATHETERIZATION N/A 11/29/2015  Procedure: Left Heart Cath and Cors/Grafts Angiography;  Surgeon: Burnell Blanks, MD;  Location: [place] CV LAB;  Service: Cardiovascular;  Laterality: N/A; . CARDIAC CATHETERIZATION N/A 11/29/2015  Procedure: Coronary Stent Intervention;  Surgeon: Burnell Blanks, MD;  Location: [place] CV LAB;  Service: Cardiovascular;  Laterality: N/A; . CATARACT EXTRACTION  2004 . CORONARY ANGIOGRAPHY N/A 02/25/2017  Procedure: Coronary Angiography;  Surgeon: Belva Crome, MD;  Location: [facility]  INVASIVE CV LAB;  Service: Cardiovascular;  Laterality: N/A; . CORONARY ANGIOPLASTY WITH STENT PLACEMENT  2004  RCA . CORONARY ARTERY BYPASS GRAFT  1989  had LIMA to his LAD, a vein to the circumflex. In 2004 underwent stenting to his right coronary artery. . CORONARY STENT INTERVENTION N/A 02/24/2017  Procedure: Coronary Stent Intervention;  Surgeon: Wellington Hampshire, MD;  Location: [place] CV LAB;  Service: Cardiovascular;  Laterality: N/A;  cfx . HEMORRHOID SURGERY   . INSERT / REPLACE / REMOVE PACEMAKER  07/17/08  DUAL-CHAMBER; PPM-ST.JUDE MEDNET . LEFT HEART CATH AND CORS/GRAFTS ANGIOGRAPHY N/A 02/24/2017  Procedure: Left Heart Cath and  Cors/Grafts Angiography;  Surgeon: Wellington Hampshire, MD;  Location: [place] CV LAB;  Service: Cardiovascular;  Laterality: N/A; . LEFT HEART CATHETERIZATION WITH CORONARY/GRAFT ANGIOGRAM N/A 12/10/2011  Procedure: LEFT HEART CATHETERIZATION WITH Beatrix Fetters;  Surgeon: Troy Sine, MD;  Location[facility] CATH LAB;  Service: Cardiovascular;  Laterality: N/A; . LEFT HEART CATHETERIZATION WITH CORONARY/GRAFT ANGIOGRAM N/A 01/26/2014  Procedure: LEFT HEART CATHETERIZATION WITH Beatrix Fetters;  Surgeon: Troy Sine, MD;  Location: [facility] CATH LAB;  Service: Cardiovascular;  Laterality: N/A; . PPM GENERATOR CHANGEOUT N/A 12/15/2017  Procedure: PPM GENERATOR CHANGEOUT;  Surgeon: Evans Lance, MD;  Location: [place] CV LAB;  Service: Cardiovascular;  Laterality: N/A; . PROSTATECTOMY   HPI: Pt is a 84 y.o. male admitted with sepsis secondary to PNA. He had a recent admission 4/16-4/18 for CVA, at which time he passed the RN stroke swallow screen. Prior swallowing evaluation was completed in July 2016 with recommendation for regular textures and thin liquids. CT Neck in April 2019 showed advanced cervical spondylosis, findings of DISH. PMH also includes: afib not on AC due to GI bleeding; pacemaker placement; HTN; HLD: stage 3 CKD; CAD s/p CABG and PCI; R carotid stenosis; and chronic combined heart failure  Subjective: pt describes dysphagia including globus sensation, coughing with intake that has been ongoing for the past year or so Assessment / Plan / Recommendation CHL IP CLINICAL IMPRESSIONS 03/16/2018 Clinical Impression Pt has a mild oropharyngeal dysphagia characterized by lingual pumping and weak lingual manipulation for A/P transit. Pt was not able to clear the barium tablet from his mouth with liquids despite multiple boluses, ultimately needing a bite of puree. Swallow initiation is intermittently delayed, with boluses resting primarily in the valleculae prior to swallow trigger. Changes  to the cervical spine as described on CT Neck were noted but did not impede bolus flow. Pt had adequate airway protection with only intermittent, trace penetration with thin liquids that cleared spontaneously upon completion of the swallow. Recommend that he initiate regular diet textures and thin liquids; meds whole in puree. SLP will f/u briefly for tolerance given concern for PNA.  SLP Visit Diagnosis Dysphagia, oropharyngeal phase (R13.12) Attention and concentration deficit following -- Frontal lobe and executive function deficit following -- Impact on safety and function --   CHL IP TREATMENT RECOMMENDATION 03/15/2018 Treatment Recommendations Therapy as outlined in treatment plan below   Prognosis 03/15/2018 Prognosis for Safe Diet Advancement Good Barriers to Reach Goals -- Barriers/Prognosis Comment -- CHL IP DIET RECOMMENDATION 03/16/2018 SLP Diet Recommendations -- Liquid Administration via -- Medication Administration -- Compensations Slow rate;Small sips/bites Postural Changes --   CHL IP OTHER RECOMMENDATIONS 03/15/2018 Recommended Consults -- Oral Care Recommendations Oral care BID Other Recommendations --   CHL IP FOLLOW UP RECOMMENDATIONS 03/16/2018 Follow up Recommendations None   CHL IP FREQUENCY AND DURATION 03/15/2018 Speech  Therapy Frequency (ACUTE ONLY) min 1 x/week Treatment Duration 1 week      CHL IP ORAL PHASE 03/15/2018 Oral Phase Impaired Oral - Pudding Teaspoon -- Oral - Pudding Cup -- Oral - Honey Teaspoon -- Oral - Honey Cup -- Oral - Nectar Teaspoon -- Oral - Nectar Cup -- Oral - Nectar Straw -- Oral - Thin Teaspoon -- Oral - Thin Cup Lingual pumping;Delayed oral transit Oral - Thin Straw Lingual pumping;Delayed oral transit Oral - Puree Lingual pumping;Delayed oral transit Oral - Mech Soft Lingual pumping;Delayed oral transit Oral - Regular -- Oral - Multi-Consistency -- Oral - Pill Lingual pumping;Delayed oral transit Oral Phase - Comment --  CHL IP PHARYNGEAL PHASE 03/15/2018 Pharyngeal Phase  Impaired Pharyngeal- Pudding Teaspoon -- Pharyngeal -- Pharyngeal- Pudding Cup -- Pharyngeal -- Pharyngeal- Honey Teaspoon -- Pharyngeal -- Pharyngeal- Honey Cup -- Pharyngeal -- Pharyngeal- Nectar Teaspoon -- Pharyngeal -- Pharyngeal- Nectar Cup -- Pharyngeal -- Pharyngeal- Nectar Straw -- Pharyngeal -- Pharyngeal- Thin Teaspoon -- Pharyngeal -- Pharyngeal- Thin Cup Delayed swallow initiation-vallecula Pharyngeal -- Pharyngeal- Thin Straw Delayed swallow initiation-vallecula Pharyngeal -- Pharyngeal- Puree Delayed swallow initiation-vallecula Pharyngeal -- Pharyngeal- Mechanical Soft Delayed swallow initiation-vallecula Pharyngeal -- Pharyngeal- Regular -- Pharyngeal -- Pharyngeal- Multi-consistency -- Pharyngeal -- Pharyngeal- Pill WFL Pharyngeal -- Pharyngeal Comment --  CHL IP CERVICAL ESOPHAGEAL PHASE 03/15/2018 Cervical Esophageal Phase WFL Pudding Teaspoon -- Pudding Cup -- Honey Teaspoon -- Honey Cup -- Nectar Teaspoon -- Nectar Cup -- Nectar Straw -- Thin Teaspoon -- Thin Cup -- Thin Straw -- Puree -- Mechanical Soft -- Regular -- Multi-consistency -- Pill -- Cervical Esophageal Comment -- No flowsheet data found. Germain Osgood 03/17/2018, 8:06 AM  Germain Osgood, M.A. CCC-SLP [phone]              Assessment and Plan:   1. NSTEMI - In setting of off antianginal therapy (Imdur and Coreg), elevated HR and pneumonia. ? Demand. Troponin trending up. EKG with new TWI anterior laterally however he is not pacing this this. This is likely normal variant.  - Given recent stroke, plan for medical therapy. Continue ASA, Plavix, Coreg, Lipitor, Zetia and Ranexa. Will restart Home dose of Imdur. Echo done pending reading.   2. Persistent atrial fibrillation - Rate elevated which off BB. Resumed Coreg today. Follow closely. Likely due to pneumonia. Not on anticoagulation due to GI bleed.   3. Recent related to right-sided ICA stenosis - Plan for endarterectomy versus carotid stenting in near futhere.    For questions or updates, please contact [place] Please consult [email or web address] for contact info under Cardiology/STEMI.   Jarrett Soho, [place]  03/17/2018 3:22 PM

## 2018-03-17 NOTE — Progress Notes (Signed)
  Echocardiogram 2D Echocardiogram has been performed.  Etherine Mackowiak T Donn Wilmot 03/17/2018, 12:09 PM

## 2018-03-18 ENCOUNTER — Inpatient Hospital Stay (HOSPITAL_COMMUNITY): Payer: Medicare HMO

## 2018-03-18 LAB — TROPONIN I
TROPONIN I: 1.93 ng/mL — AB (ref <0.03)
TROPONIN I: 2.14 ng/mL — AB (ref <0.03)
TROPONIN I: 1.62 ng/mL — AB (ref <0.03)
Troponin I: 2.98 ng/mL (ref <0.03)

## 2018-03-18 MED ORDER — POLYETHYLENE GLYCOL 3350 17 G PO PACK
17.0000 g | PACK | Freq: Every day | ORAL | Status: DC
  Administered 2018-03-18 – 2018-03-19 (×2): 17 g via ORAL
  Filled 2018-03-18 (×2): qty 1

## 2018-03-18 MED ORDER — LEVOTHYROXINE SODIUM 100 MCG PO TABS
125.0000 ug | ORAL_TABLET | Freq: Every day | ORAL | Status: DC
Start: 2018-03-18 — End: 2018-03-19
  Administered 2018-03-18 – 2018-03-19 (×2): 125 ug via ORAL
  Filled 2018-03-18 (×2): qty 1

## 2018-03-18 MED ORDER — GUAIFENESIN-DM 100-10 MG/5ML PO SYRP
10.0000 mL | ORAL_SOLUTION | Freq: Four times a day (QID) | ORAL | Status: DC
  Administered 2018-03-18 – 2018-03-19 (×5): 10 mL via ORAL
  Filled 2018-03-18 (×5): qty 10

## 2018-03-18 NOTE — Progress Notes (Signed)
PROGRESS NOTE    John Peck  EXB:[id number] DOB: Jun 05, 1924 DOA: 03/14/2018 PCP: Seward Carol, MD   Brief Narrative: Patient is a 84 year old male with past medical history of atrial fibrillation not on anticoagulant due to GI bleed, hypertension, hyperlipidemia, CKD stage III, coronary artery disease status post CABG, right carotid artery stenosis, pacemaker placement, combined systolic and diastolic CHF who presented to the hospital with complaints of shortness of breath and cough.  Patient was just admitted 2 weeks ago and was diagnosed with possible stroke related to right-sided ICA stenosis.  On this presentation, chest x-ray was done which showed concern for multifocal pneumonia.  Patient is currently treated with IV antibiotics.  Patient also developed chest pain during this hospitalization.  Troponin found to be elevated.  Cardiology consulted.  Assessment & Plan:   Principal Problem:   HCAP (healthcare-associated pneumonia) Active Problems:   S/P CABG x 2 '89. RCA stent'04. last cath Jan 2013   Chest pain   Pacemaker   Chronic combined systolic and diastolic CHF (congestive heart failure) (HCC)   COPD (chronic obstructive pulmonary disease) ([place])   H/O: GI bleed   CKD (chronic kidney disease), stage III (HCC)   Anemia   Paroxysmal atrial fibrillation (HCC)   Hypothyroidism   Carotid stenosis, right   Bilateral conjunctivitis   Atrial fibrillation with RVR (HCC)  Acute respiratory failure with hypoxia/Health care associated Pneumonia: Suspect healthcare respiratory pneumonia versus aspiration pneumonia. Respiratory status has improved.  Continue bronchodilators, supplemental oxygen as necessary.  Patient is not on oxygen at home.  We will continue to taper the oxygen.  Continue broad-spectrum antibiotics for now.On Zosyn.Cultures negative so far.  Patient is afebrile. Speech is following for possible aspiration pneumonia.  Recommended regular solids and thin liquid. He  was also started on steroids for wheezing.  We discontinued steroids. CXR today showed Stable infiltrates are noted in the left base and right upper lobe.   Chest Pain: He has extensive history of coronary artery disease.  Status post CABG. Complained of chest pain but currently off chest pain.  Troponin elevated and trended up.Cardiology following.  .  Chest pain has improved this morning.  Combined systolic/diastolic heart failure:Echocardiogram ordered and showed ejection fraction of 45 to 50%, hypokinesis of the inferior myocardium, moderate left ventricular hypertrophy, moderately increased pulmonary artery pressure .Previous echo had shown ejection fraction of 40% with grade 2 diastolic dysfunction.  Currently euvolemic.  History of coronary artery disease: Status post CABG.  Continue aspirin, Plavix, Ranexa, Zetia,,imdur. Not on ACE or ARB due to CKD.  Paroxysmal A. fib: Currently on A. fib with RVR.  Started on metoprolol by cardiology.he is  status post pacemaker.  Not on anti-Coagulation due to history of previous GI bleed.  Hypothyroidism: Continue Synthyroid.  History of BPH: Continue Flomax.  History of CVA: Was recently admitted here.  Has chronic right-sided ICA stenosis.  Patient was evaluated by PT/OT and was recommended skilled nursing facility .  Social worker following.  History of CKD :H/O CKD stage 3.  Baseline creatinine ranges from 1.3-1.4. Was on acute kidney injury on presentation and was started on gentle IV fluids.Will stop IV fluids.   DVT prophylaxis: SCD Code Status: Full Family Communication: None present at the bedside Disposition Plan: Likely home with home health vs SNF   Consultants: None  Procedures:None  Antimicrobials:  Zosyn since 5/7  Subjective: Patient seen and examined the bedside this morning.  No complaints of chest pain.  Complains of some mild  shortness of breath.  Objective: Vitals:   03/17/18 2135 03/17/18 2356 03/18/18 0854  03/18/18 1043  BP: 108/84 130/83 122/78   Pulse: (!) 114 (!) 115 (!) 120   Resp:  (!) 24 (!) 22   Temp:   98 F (36.7 C)   TempSrc:   Oral   SpO2:  100% 96%   Weight:    86.9 kg (191 lb 9.3 oz)  Height:        Intake/Output Summary (Last 24 hours) at 03/18/2018 1403 Last data filed at 03/18/2018 1330 Gross per 24 hour  Intake 100 ml  Output 2175 ml  Net -2075 ml   Filed Weights   03/15/18 0500 03/17/18 0304 03/18/18 1043  Weight: 82 kg (180 lb 12.4 oz) 84 kg (185 lb 3 oz) 86.9 kg (191 lb 9.3 oz)    Examination:  General exam: Appears calm and comfortable ,Not in distress,obese HEENT:PERRL,Oral mucosa moist, Ear/Nose normal on gross exam Respiratory system: Bilateral decreased air entry, coarse breathing sounds Cardiovascular system: S1 & S2 heard, RRR. No JVD, murmurs, rubs, gallops or clicks. Gastrointestinal system: Abdomen is nondistended, soft and nontender. No organomegaly or masses felt. Normal bowel sounds heard. Central nervous system: Alert and oriented. No focal neurological deficits. Extremities: No edema, no clubbing ,no cyanosis, distal peripheral pulses palpable. Skin: No rashes, lesions or ulcers,no icterus ,no pallor    Data reviewed: I have personally reviewed following labs and imaging studies  CBC: Recent Labs  Lab 03/14/18 1023 03/16/18 0243  WBC 2.7* 4.9  NEUTROABS 1.9  --   HGB 9.9* 9.0*  HCT 29.7* 27.1*  MCV 97.7 94.4  PLT 81* 91*   Basic Metabolic Panel: Recent Labs  Lab 03/14/18 1212 03/15/18 0354 03/16/18 0243 03/17/18 0219  NA 134* 137 136 134*  K 3.7 4.4 4.2 4.8  CL 101 107 108 106  CO2 23 20* 20* 19*  GLUCOSE 101* 120* 147* 195*  BUN 26* 29* 36* 36*  CREATININE 1.72* 1.63* 1.53* 1.56*  CALCIUM 8.1* 8.0* 8.1* 8.4*  MG  --   --  1.9  --    GFR: Estimated Creatinine Clearance: 33.7 mL/min (A) (by C-G formula based on SCr of 1.56 mg/dL (H)). Liver Function Tests: Recent Labs  Lab 03/14/18 1212 03/15/18 0354  AST 15 39    ALT 24 43  ALKPHOS 47 52  BILITOT 1.6* 1.6*  PROT 5.9* 5.9*  ALBUMIN 2.7* 2.5*   No results for input(s): LIPASE, AMYLASE in the last 168 hours. No results for input(s): AMMONIA in the last 168 hours. Coagulation Profile: Recent Labs  Lab 03/14/18 1023  INR 1.40   Cardiac Enzymes: Recent Labs  Lab 03/17/18 0752 03/17/18 1410 03/17/18 1928 03/18/18 0226 03/18/18 1001  TROPONINI 0.68* 1.61* 2.57* 2.98* 1.93*   BNP (last 3 results) No results for input(s): PROBNP in the last 8760 hours. HbA1C: No results for input(s): HGBA1C in the last 72 hours. CBG: No results for input(s): GLUCAP in the last 168 hours. Lipid Profile: No results for input(s): CHOL, HDL, LDLCALC, TRIG, CHOLHDL, LDLDIRECT in the last 72 hours. Thyroid Function Tests: No results for input(s): TSH, T4TOTAL, FREET4, T3FREE, THYROIDAB in the last 72 hours. Anemia Panel: No results for input(s): VITAMINB12, FOLATE, FERRITIN, TIBC, IRON, RETICCTPCT in the last 72 hours. Sepsis Labs: Recent Labs  Lab 03/14/18 1043 03/14/18 1301 03/14/18 1619 03/15/18 0354 03/16/18 0243  PROCALCITON  --   --  4.48 5.46 5.15  LATICACIDVEN 1.24 1.21  --   --   --  Recent Results (from the past 240 hour(s))  Culture, blood (Routine x 2)     Status: None (Preliminary result)   Collection Time: 03/14/18 10:23 AM  Result Value Ref Range Status   Specimen Description BLOOD BLOOD RIGHT WRIST  Final   Special Requests   Final    BOTTLES DRAWN AEROBIC AND ANAEROBIC Blood Culture adequate volume   Culture   Final    NO GROWTH 4 DAYS Performed at [facility], [address], [place], [place] [address]    Report Status PENDING  Incomplete  Culture, blood (Routine x 2)     Status: None (Preliminary result)   Collection Time: 03/14/18 10:28 AM  Result Value Ref Range Status   Specimen Description BLOOD LEFT ANTECUBITAL  Final   Special Requests   Final    BOTTLES DRAWN AEROBIC AND ANAEROBIC Blood Culture adequate  volume   Culture   Final    NO GROWTH 4 DAYS Performed at [facility], [place] [address] [place], [place] [address]    Report Status PENDING  Incomplete  Respiratory Panel by PCR     Status: None   Collection Time: 03/14/18 11:32 AM  Result Value Ref Range Status   Adenovirus NOT DETECTED NOT DETECTED Final   Coronavirus 229E NOT DETECTED NOT DETECTED Final   Coronavirus HKU1 NOT DETECTED NOT DETECTED Final   Coronavirus NL63 NOT DETECTED NOT DETECTED Final   Coronavirus OC43 NOT DETECTED NOT DETECTED Final   Metapneumovirus NOT DETECTED NOT DETECTED Final   Rhinovirus / Enterovirus NOT DETECTED NOT DETECTED Final   Influenza A NOT DETECTED NOT DETECTED Final   Influenza B NOT DETECTED NOT DETECTED Final   Parainfluenza Virus 1 NOT DETECTED NOT DETECTED Final   Parainfluenza Virus 2 NOT DETECTED NOT DETECTED Final   Parainfluenza Virus 3 NOT DETECTED NOT DETECTED Final   Parainfluenza Virus 4 NOT DETECTED NOT DETECTED Final   Respiratory Syncytial Virus NOT DETECTED NOT DETECTED Final   Bordetella pertussis NOT DETECTED NOT DETECTED Final   Chlamydophila pneumoniae NOT DETECTED NOT DETECTED Final   Mycoplasma pneumoniae NOT DETECTED NOT DETECTED Final    Comment: Performed at [facility], [place] [address], [place], [place] [address]  MRSA PCR Screening     Status: None   Collection Time: 03/17/18  3:28 PM  Result Value Ref Range Status   MRSA by PCR NEGATIVE NEGATIVE Final    Comment:        The GeneXpert MRSA Assay (FDA approved for NASAL specimens only), is one component of a comprehensive MRSA colonization surveillance program. It is not intended to diagnose MRSA infection nor to guide or monitor treatment for MRSA infections. Performed at [facility], [place] [address], [place], [place] [address]          Radiology Studies: Dg Chest Port 1 View  Result Date: 03/18/2018 CLINICAL DATA:  Shortness of breath EXAM: PORTABLE CHEST 1 VIEW COMPARISON:   03/17/2018 FINDINGS: Cardiac shadow is enlarged. Postsurgical changes and pacing device are again seen and stable. Stable infiltrates are noted in the left base and right upper lobe. No new focal abnormality is seen. IMPRESSION: No significant change from the previous exam. Electronically Signed   By: Inez Catalina M.D.   On: 03/18/2018 12:51   Dg Chest Port 1 View  Result Date: 03/17/2018 CLINICAL DATA:  84 year old male with acute respiratory distress EXAM: PORTABLE CHEST 1 VIEW COMPARISON:  Chest radiograph dated 03/14/2018 FINDINGS:  There is mild underlying interstitial edema. Bilateral streaky and hazy interstitial and airspace densities primarily involving the right upper lung field and left lung base may represent edema or pneumonia. Clinical correlation is recommended. There is a small left pleural effusion. No pneumothorax. Stable cardiac silhouette. Median sternotomy wires and CABG vascular clips. Left pectoral pacemaker device. No acute osseous pathology. IMPRESSION: Mild interstitial edema. Areas of streaky density in the right upper lobe and left lung base may represent superimposed pneumonia. Overall no significant interval change compared to prior radiograph. Small left pleural effusion. Electronically Signed   By: Anner Crete M.D.   On: 03/17/2018 03:58        Scheduled Meds: . amLODipine  5 mg Oral Daily  . aspirin EC  81 mg Oral Daily  . atorvastatin  40 mg Oral Daily  . clopidogrel  75 mg Oral Q1500  . ezetimibe  10 mg Oral Daily  . guaiFENesin-dextromethorphan  10 mL Oral Q6H  . isosorbide mononitrate  30 mg Oral QPM  . isosorbide mononitrate  90 mg Oral q morning - 10a  . latanoprost  1 drop Both Eyes Once per day on Mon Thu  . levothyroxine  125 mcg Oral QAC breakfast  . metoprolol tartrate  50 mg Oral BID  . polyethylene glycol  17 g Oral Daily  . ranolazine  1,000 mg Oral BID  . sodium chloride flush  3 mL Intravenous Q12H  . tamsulosin  0.4 mg Oral Daily  .  tobramycin  2 drop Both Eyes Q6H   Continuous Infusions: . piperacillin-tazobactam (ZOSYN)  IV 3.375 g (03/18/18 0930)     LOS: 4 days    Time spent: 35 mins.More than 50% of that time was spent in counseling and/or coordination of care.      Shelly Coss, MD [facility] Pager [phone]  If 7PM-7AM, please contact night-coverage [email or web address] Password [email or web address] 03/18/2018, 2:03 PM

## 2018-03-18 NOTE — Care Management Important Message (Signed)
Important Message  Patient Details  Name: DARELL SAPUTO MRN: [id number] Date of Birth: 10-12-1924   Medicare Important Message Given:  Yes    Holland Kotter P Nathon Stefanski 03/18/2018, 1:19 PM

## 2018-03-18 NOTE — Progress Notes (Signed)
Progress Note  Patient Name: John Peck Date of Encounter: 03/18/2018  Primary Cardiologist: Shelva Majestic, MD   Subjective   Feels better today. No chest pain this morning. Last episode of CP was last night and it resolved after 1 SL NTG. No recurrence.   Inpatient Medications    Scheduled Meds: . amLODipine  5 mg Oral Daily  . aspirin EC  81 mg Oral Daily  . atorvastatin  40 mg Oral Daily  . clopidogrel  75 mg Oral Q1500  . ezetimibe  10 mg Oral Daily  . guaiFENesin-dextromethorphan  10 mL Oral Q6H  . isosorbide mononitrate  30 mg Oral QPM  . isosorbide mononitrate  90 mg Oral q morning - 10a  . latanoprost  1 drop Both Eyes Once per day on Mon Thu  . levothyroxine  125 mcg Oral QAC breakfast  . metoprolol tartrate  50 mg Oral BID  . polyethylene glycol  17 g Oral Daily  . ranolazine  1,000 mg Oral BID  . sodium chloride flush  3 mL Intravenous Q12H  . tamsulosin  0.4 mg Oral Daily  . tobramycin  2 drop Both Eyes Q6H   Continuous Infusions: . piperacillin-tazobactam (ZOSYN)  IV 3.375 g (03/18/18 0930)  . vancomycin Stopped (03/17/18 1838)   PRN Meds: acetaminophen **OR** acetaminophen, albuterol, alum & mag hydroxide-simeth, nitroGLYCERIN, ondansetron **OR** ondansetron (ZOFRAN) IV   Vital Signs    Vitals:   03/17/18 2135 03/17/18 2356 03/18/18 0854 03/18/18 1043  BP: 108/84 130/83 122/78   Pulse: (!) 114 (!) 115 (!) 120   Resp:  (!) 24 (!) 22   Temp:   98 F (36.7 C)   TempSrc:   Oral   SpO2:  100% 96%   Weight:    191 lb 9.3 oz (86.9 kg)  Height:        Intake/Output Summary (Last 24 hours) at 03/18/2018 1112 Last data filed at 03/18/2018 0244 Gross per 24 hour  Intake 100 ml  Output 900 ml  Net -800 ml   Filed Weights   03/15/18 0500 03/17/18 0304 03/18/18 1043  Weight: 180 lb 12.4 oz (82 kg) 185 lb 3 oz (84 kg) 191 lb 9.3 oz (86.9 kg)    Telemetry    afib 90s- low 100s - Personally Reviewed  ECG    03/17/18 afib w/ rVR 118 bpm -  Personally Reviewed  Physical Exam   GEN: No acute distress.   Neck: No JVD Cardiac: irregularlly irregular rhythm, regular rate  Respiratory: Clear to auscultation bilaterally with mild expiratory wheezing. GI: Soft, nontender, non-distended  MS: No edema; No deformity. Neuro:  Nonfocal  Psych: Normal affect   Labs    Chemistry Recent Labs  Lab 03/14/18 1212 03/15/18 0354 03/16/18 0243 03/17/18 0219  NA 134* 137 136 134*  K 3.7 4.4 4.2 4.8  CL 101 107 108 106  CO2 23 20* 20* 19*  GLUCOSE 101* 120* 147* 195*  BUN 26* 29* 36* 36*  CREATININE 1.72* 1.63* 1.53* 1.56*  CALCIUM 8.1* 8.0* 8.1* 8.4*  PROT 5.9* 5.9*  --   --   ALBUMIN 2.7* 2.5*  --   --   AST 15 39  --   --   ALT 24 43  --   --   ALKPHOS 47 52  --   --   BILITOT 1.6* 1.6*  --   --   GFRNONAA 33* 35* 38* 37*  GFRAA 38* 41* 44* 43*  ANIONGAP  10 10 8 9      Hematology Recent Labs  Lab 03/14/18 1023 03/16/18 0243  WBC 2.7* 4.9  RBC 3.04* 2.87*  HGB 9.9* 9.0*  HCT 29.7* 27.1*  MCV 97.7 94.4  MCH 32.6 31.4  MCHC 33.3 33.2  RDW 15.4 15.6*  PLT 81* 91*    Cardiac Enzymes Recent Labs  Lab 03/17/18 0752 03/17/18 1410 03/17/18 1928 03/18/18 0226  TROPONINI 0.68* 1.61* 2.57* 2.98*   No results for input(s): TROPIPOC in the last 168 hours.   BNP Recent Labs  Lab 03/14/18 1023  BNP 500.7*     DDimer No results for input(s): DDIMER in the last 168 hours.   Radiology    Dg Chest Port 1 View  Result Date: 03/17/2018 CLINICAL DATA:  84 year old male with acute respiratory distress EXAM: PORTABLE CHEST 1 VIEW COMPARISON:  Chest radiograph dated 03/14/2018 FINDINGS: There is mild underlying interstitial edema. Bilateral streaky and hazy interstitial and airspace densities primarily involving the right upper lung field and left lung base may represent edema or pneumonia. Clinical correlation is recommended. There is a small left pleural effusion. No pneumothorax. Stable cardiac silhouette. Median  sternotomy wires and CABG vascular clips. Left pectoral pacemaker device. No acute osseous pathology. IMPRESSION: Mild interstitial edema. Areas of streaky density in the right upper lobe and left lung base may represent superimposed pneumonia. Overall no significant interval change compared to prior radiograph. Small left pleural effusion. Electronically Signed   By: Anner Crete M.D.   On: 03/17/2018 03:58    Cardiac Studies   2D Echo 03/17/18  Study Conclusions  - Left ventricle: The cavity size was normal. Wall thickness was   increased in a pattern of moderate LVH. Systolic function was   mildly reduced. The estimated ejection fraction was in the range   of 45% to 50%. Hypokinesis of the inferior myocardium. - Aortic valve: There was mild regurgitation. - Mitral valve: There was moderate regurgitation. - Left atrium: The atrium was moderately dilated. - Right ventricle: The cavity size was mildly dilated. Wall   thickness was normal. - Right atrium: The atrium was moderately dilated. - Tricuspid valve: There was moderate regurgitation. - Pulmonary arteries: Systolic pressure was moderately increased.   PA peak pressure: 48 mm Hg (S).   Patient Profile     84 y.o. male w/ h/o CAD s/p CABG and subsequent PCI, chronic combined systolic and diastolic CHF, IDA, HTN, BPH, COPD, CKD stage III, SSS s/p PPM, recent admission for acute CVA in April, in the setting of obstructive carotid artery disease (plan is for carotid endarterectomy vs stenting). Readmitted 03/14/18 for acute hypoxic respiratory failure 2/2 multifocal PNA. Hospital course complicated by chest and jaw pain, in the setting of atrial fibrillation w/ RVR. Troponin noted to be elevated and continues to trend upward, 1.61>>2.57>>2.98. Echo this admit shows slighely improved EF, in comparison to prior study. EF 45-50%   Assessment & Plan    1. Chest Pain/CAD: h/o CABG and subsequent PCI.  His last cath 02/24/2017 showed LIMA  to LAD patent but vein graft to the left circumflex marginal was occluded with an 80% ostial left circumflex and patent stents in the RCA.  He underwent DES to the ostium of the left circumflex. His recent CP during this admission was in the setting of rapid Afib. Rates improved and pt is now CP free. Troponin level however trending up 1.61>>2.57>>2.98. Echo this admit shows slightly improved EF, in comparison to prior study. EF 45-50%. Given  his advanced age of 15 and CKD, medical management may be the best option. Will defer to MD. We can try to adjust antianginal regimen. Keep Afib rated controlled. MD to follow with further recommendations.   2. Multifocal PNA: management per IM.     For questions or updates, please contact [place] Please consult [email or web address] for contact info under Cardiology/STEMI.      Signed, Lyda Jester, PA-C  03/18/2018, 11:12 AM

## 2018-03-18 NOTE — Progress Notes (Signed)
OT Cancellation Note  Patient Details Name: John Peck MRN: [id number] DOB: [birth year]   Cancelled Treatment:    Reason Eval/Treat Not Completed: Medical issues which prohibited therapy; pt noted to have elevated troponin levels and noted per chart review pt having chest pain overnight; will hold OT at this time and follow up as schedule permits and as pt is medically ready.   Lou Cal, OT Pager [phone] 03/18/2018   [name] 03/18/2018, 11:10 AM

## 2018-03-18 NOTE — Clinical Social Work Note (Addendum)
Patient in need of SNF for ST rehab and [facility] chosen by family (daughter Donne Hazel). Admissions director Cleon Dew contacted and can take patient, will initiate authorization and will accept an LOG. CSW initially advised that patient ready for discharge today, then later informed that he would not discharge today and [facility] informed. Once medically stable, patient will discharge to [facility].  Ariza Evans Givens, MSW, LCSW Licensed Clinical Social Worker [place] [phone]

## 2018-03-18 NOTE — Progress Notes (Signed)
CRITICAL VALUE ALERT  Critical Value:  Troponin 2.98  Date & Time Notied:  03/18/2018  Provider Notified: TRH-Bodenheimer, NP  Orders Received/Actions taken: Awaiting new orders

## 2018-03-19 DIAGNOSIS — M6281 Muscle weakness (generalized): Secondary | ICD-10-CM | POA: Diagnosis not present

## 2018-03-19 DIAGNOSIS — J9620 Acute and chronic respiratory failure, unspecified whether with hypoxia or hypercapnia: Secondary | ICD-10-CM | POA: Diagnosis not present

## 2018-03-19 DIAGNOSIS — E559 Vitamin D deficiency, unspecified: Secondary | ICD-10-CM | POA: Diagnosis not present

## 2018-03-19 DIAGNOSIS — I480 Paroxysmal atrial fibrillation: Secondary | ICD-10-CM | POA: Diagnosis not present

## 2018-03-19 DIAGNOSIS — R079 Chest pain, unspecified: Secondary | ICD-10-CM | POA: Diagnosis not present

## 2018-03-19 DIAGNOSIS — R2689 Other abnormalities of gait and mobility: Secondary | ICD-10-CM | POA: Diagnosis not present

## 2018-03-19 DIAGNOSIS — R609 Edema, unspecified: Secondary | ICD-10-CM | POA: Diagnosis not present

## 2018-03-19 DIAGNOSIS — I214 Non-ST elevation (NSTEMI) myocardial infarction: Secondary | ICD-10-CM

## 2018-03-19 DIAGNOSIS — J189 Pneumonia, unspecified organism: Secondary | ICD-10-CM | POA: Diagnosis not present

## 2018-03-19 DIAGNOSIS — N183 Chronic kidney disease, stage 3 (moderate): Secondary | ICD-10-CM | POA: Diagnosis not present

## 2018-03-19 DIAGNOSIS — Z8673 Personal history of transient ischemic attack (TIA), and cerebral infarction without residual deficits: Secondary | ICD-10-CM | POA: Diagnosis not present

## 2018-03-19 DIAGNOSIS — Z950 Presence of cardiac pacemaker: Secondary | ICD-10-CM | POA: Diagnosis not present

## 2018-03-19 DIAGNOSIS — Z7401 Bed confinement status: Secondary | ICD-10-CM | POA: Diagnosis not present

## 2018-03-19 DIAGNOSIS — E785 Hyperlipidemia, unspecified: Secondary | ICD-10-CM | POA: Diagnosis not present

## 2018-03-19 DIAGNOSIS — R278 Other lack of coordination: Secondary | ICD-10-CM | POA: Diagnosis not present

## 2018-03-19 DIAGNOSIS — I2510 Atherosclerotic heart disease of native coronary artery without angina pectoris: Secondary | ICD-10-CM | POA: Diagnosis not present

## 2018-03-19 DIAGNOSIS — J449 Chronic obstructive pulmonary disease, unspecified: Secondary | ICD-10-CM | POA: Diagnosis not present

## 2018-03-19 DIAGNOSIS — R41841 Cognitive communication deficit: Secondary | ICD-10-CM | POA: Diagnosis not present

## 2018-03-19 DIAGNOSIS — I25810 Atherosclerosis of coronary artery bypass graft(s) without angina pectoris: Secondary | ICD-10-CM | POA: Diagnosis not present

## 2018-03-19 DIAGNOSIS — D649 Anemia, unspecified: Secondary | ICD-10-CM | POA: Diagnosis not present

## 2018-03-19 DIAGNOSIS — N401 Enlarged prostate with lower urinary tract symptoms: Secondary | ICD-10-CM | POA: Diagnosis not present

## 2018-03-19 DIAGNOSIS — I5042 Chronic combined systolic (congestive) and diastolic (congestive) heart failure: Secondary | ICD-10-CM | POA: Diagnosis not present

## 2018-03-19 DIAGNOSIS — I10 Essential (primary) hypertension: Secondary | ICD-10-CM | POA: Diagnosis not present

## 2018-03-19 DIAGNOSIS — R52 Pain, unspecified: Secondary | ICD-10-CM | POA: Diagnosis not present

## 2018-03-19 DIAGNOSIS — M2550 Pain in unspecified joint: Secondary | ICD-10-CM | POA: Diagnosis not present

## 2018-03-19 DIAGNOSIS — E039 Hypothyroidism, unspecified: Secondary | ICD-10-CM | POA: Diagnosis not present

## 2018-03-19 DIAGNOSIS — R0603 Acute respiratory distress: Secondary | ICD-10-CM

## 2018-03-19 DIAGNOSIS — I5040 Unspecified combined systolic (congestive) and diastolic (congestive) heart failure: Secondary | ICD-10-CM | POA: Diagnosis not present

## 2018-03-19 LAB — CBC WITH DIFFERENTIAL/PLATELET
BASOS ABS: 0 10*3/uL (ref 0.0–0.1)
BASOS PCT: 0 %
EOS ABS: 0.1 10*3/uL (ref 0.0–0.7)
EOS PCT: 2 %
HEMATOCRIT: 27.5 % — AB (ref 39.0–52.0)
Hemoglobin: 9 g/dL — ABNORMAL LOW (ref 13.0–17.0)
Lymphocytes Relative: 9 %
Lymphs Abs: 0.5 10*3/uL — ABNORMAL LOW (ref 0.7–4.0)
MCH: 31.3 pg (ref 26.0–34.0)
MCHC: 32.7 g/dL (ref 30.0–36.0)
MCV: 95.5 fL (ref 78.0–100.0)
MONO ABS: 0.6 10*3/uL (ref 0.1–1.0)
MONOS PCT: 10 %
Neutro Abs: 4.3 10*3/uL (ref 1.7–7.7)
Neutrophils Relative %: 79 %
PLATELETS: 118 10*3/uL — AB (ref 150–400)
RBC: 2.88 MIL/uL — ABNORMAL LOW (ref 4.22–5.81)
RDW: 16 % — AB (ref 11.5–15.5)
WBC: 5.4 10*3/uL (ref 4.0–10.5)

## 2018-03-19 LAB — BASIC METABOLIC PANEL
ANION GAP: 5 (ref 5–15)
BUN: 28 mg/dL — ABNORMAL HIGH (ref 6–20)
CALCIUM: 8 mg/dL — AB (ref 8.9–10.3)
CO2: 26 mmol/L (ref 22–32)
CREATININE: 1.39 mg/dL — AB (ref 0.61–1.24)
Chloride: 104 mmol/L (ref 101–111)
GFR, EST AFRICAN AMERICAN: 49 mL/min — AB (ref >60)
GFR, EST NON AFRICAN AMERICAN: 43 mL/min — AB (ref >60)
Glucose, Bld: 109 mg/dL — ABNORMAL HIGH (ref 65–99)
Potassium: 4.5 mmol/L (ref 3.5–5.1)
SODIUM: 135 mmol/L (ref 135–145)

## 2018-03-19 LAB — CULTURE, BLOOD (ROUTINE X 2)
CULTURE: NO GROWTH
Culture: NO GROWTH
SPECIAL REQUESTS: ADEQUATE
Special Requests: ADEQUATE

## 2018-03-19 MED ORDER — METOPROLOL TARTRATE 50 MG PO TABS: 50.0000 mg | ORAL_TABLET | Freq: Two times a day (BID) | ORAL | 0 refills | Status: DC

## 2018-03-19 MED ORDER — FUROSEMIDE 20 MG PO TABS: 20.0000 mg | ORAL_TABLET | Freq: Every day | ORAL | 0 refills | Status: DC

## 2018-03-19 MED ORDER — AMOXICILLIN-POT CLAVULANATE 875-125 MG PO TABS: 1.0000 | ORAL_TABLET | Freq: Two times a day (BID) | ORAL | 0 refills | Status: AC

## 2018-03-19 MED ORDER — GUAIFENESIN-DM 100-10 MG/5ML PO SYRP: 10.0000 mL | ORAL_SOLUTION | Freq: Four times a day (QID) | ORAL | 0 refills | Status: DC

## 2018-03-19 MED ORDER — PREDNISONE 10 MG PO TABS: ORAL_TABLET | ORAL | 0 refills | Status: DC

## 2018-03-19 MED ORDER — POLYETHYLENE GLYCOL 3350 17 G PO PACK: 17.0000 g | PACK | Freq: Every day | ORAL | 0 refills | Status: DC | PRN

## 2018-03-19 NOTE — Clinical Social Work Placement (Signed)
Nurse to call report to [phone], Room [place]  NOTE  Date:  03/19/2018  Patient Details  Name: John Peck MRN: [id number] Date of Birth: 05/17/1924  Clinical Social Work is seeking post-discharge placement for this patient at the [place] level of care (*CSW will initial, date and re-position this form in  chart as items are completed):  Yes   Patient/family provided with [facility] Work Department's list of facilities offering this level of care within the geographic area requested by the patient (or if unable, by the patient's family).  Yes   Patient/family informed of their freedom to choose among providers that offer the needed level of care, that participate in Medicare, Medicaid or managed care program needed by the patient, have an available bed and are willing to accept the patient.  Yes   Patient/family informed of [facility]'s ownership interest in [facility] and [facility], as well as of the fact that they are under no obligation to receive care at these facilities.  PASRR submitted to EDS on 03/16/18     PASRR number received on 03/16/18     Existing PASRR number confirmed on       FL2 transmitted to all facilities in geographic area requested by pt/family on 03/16/18     FL2 transmitted to all facilities within larger geographic area on       Patient informed that his/her managed care company has contracts with or will negotiate with certain facilities, including the following:        Yes   Patient/family informed of bed offers received.  Patient chooses bed at [facility]     Physician recommends and patient chooses bed at      Patient to be transferred to [facility] on 03/19/18.  Patient to be transferred to facility by [facility]     Patient family notified on 03/19/18 of transfer.  Name of family member notified:  Daughter     PHYSICIAN       Additional  Comment:    _______________________________________________ Geralynn Ochs, [place] 03/19/2018, 1:43 PM

## 2018-03-19 NOTE — Discharge Summary (Signed)
Physician Discharge Summary  John Peck:[id number] DOB: 1924-11-07 DOA: 03/14/2018  PCP: Seward Carol, MD  Admit date: 03/14/2018 Discharge date: 03/19/2018  Admitted From: Home Disposition:  SNF Discharge Condition:Stable CODE STATUS:FULL Diet recommendation: Heart Healthy  Brief/Interim Summary: Patient is a 84 year old male with past medical history of atrial fibrillation not on anticoagulant due to GI bleed, hypertension, hyperlipidemia, CKD stage III, coronary artery disease status post CABG, right carotid artery stenosis, pacemaker placement, combined systolic and diastolic CHF who presented to the hospital with complaints of shortness of breath and cough.  Patient was just admitted 2 weeks ago and was diagnosed with possible stroke related to right-sided ICA stenosis.  On this presentation, chest x-ray was done which showed concern for multifocal pneumonia.  Patient was treated with IV antibiotics.  His respiratory status gradually improved with IV antibiotics.  Cultures remain negative.  Patient was also treated with IV steroids for COPD exacerbation which was changed to oral later. Patient also developed chest pain during this hospitalization.  Troponin found to be elevated.  Cardiology consulted.  Chest pain is spontaneously resolved.  Patient denied any interventions by cardiology. Today he is overall status is stable.  He looks comfortable and saturating fine on room air.  Denies any chest pain. Patient is stable for discharge to skilled nursing facility today.  Following problems were addressed during his hospitalization:  Acute respiratory failure with hypoxia/Health care associated Pneumonia: Suspect healthcare respiratory pneumonia versus aspiration pneumonia. Respiratory status has improved.  Patient is not on oxygen at home.  He is off oxygen now.Started on broad-spectrum antibiotics on admission which have been changed to oral today.Cultures negative so far.  Patient  is afebrile. Speech was following for possible aspiration pneumonia.  Recommended regular solids and thin liquid. He was also started on steroids for wheezing.  He has history of COPD. Last CXR showed Stable infiltrates are noted in the left base and right upper lobe.   Chest Pain: He has extensive history of coronary artery disease.  Status post CABG. Complained of chest pain but currently off chest pain.  Troponin elevated but trended down.Cardiology was following.  He denies any further intervention so we decided to continue medical treatment.  Combined systolic/diastolic heart failure:Echocardiogram ordered and showed ejection fraction of 45 to 50%, hypokinesis of the inferior myocardium, moderate left ventricular hypertrophy, moderately increased pulmonary artery pressure . Currently euvolemic.  We will put him on Lasix 20 mg daily on discharge. He should follow-up with his cardiologist on discharge.  COPD: Still has mild expiratory wheezes.  We will discharge him on tapering dose of prednisone.  Follow-up with pulmonology as an outpatient.  History of coronary artery disease: Status post CABG.  Continue aspirin, Plavix, Ranexa, Zetia,imdur. Not on ACE or ARB due to CKD.  Paroxysmal A. Fib: Went on A. fib with RVR during this hospitalization.  Started on metoprolol by cardiology.He also  has a pacemaker.  Not on anti-Coagulation due to history of previous GI bleed.  Currently rate is controlled.  Hypothyroidism: Continue Synthyroid.  History of BPH: Continue Flomax.  History of CVA: Was recently admitted here.  Has chronic right-sided ICA stenosis.  Patient was evaluated by PT/OT and was recommended skilled nursing facility .  Social worker was following.  History of CKD :H/O CKD stage 3.  Baseline creatinine ranges from 1.3-1.4. Was on acute kidney injury on presentation and was started on gentle IV fluids.Will stop IV fluids.  Currently his kidney function is at baseline.  Will  resume Lasix on discharge.  Check BMP in a week.     Discharge Diagnoses:  Principal Problem:   HCAP (healthcare-associated pneumonia) Active Problems:   S/P CABG x 2 '89. RCA stent'04. last cath Jan 2013   Chest pain   Pacemaker   Chronic combined systolic and diastolic CHF (congestive heart failure) (HCC)   COPD (chronic obstructive pulmonary disease) ([place])   H/O: GI bleed   CKD (chronic kidney disease), stage III (HCC)   Anemia   Paroxysmal atrial fibrillation (HCC)   Hypothyroidism   Carotid stenosis, right   Bilateral conjunctivitis   Atrial fibrillation with RVR (HCC)   Acute respiratory distress   Non-ST elevation (NSTEMI) myocardial infarction [facility])    Discharge Instructions  Discharge Instructions    Ambulatory referral to Pulmonology   Complete by:  As directed    Diet - low sodium heart healthy   Complete by:  As directed    Discharge instructions   Complete by:  As directed    1) Please follow up with your PCP in 1-2 weeks. 2) Follow up with your cardiologist in 2 weeks. 3) Follow up with Pulmonology as an outpatient. 4) Take prescribed medications as instructed. 5) Do a CBC and BMP test in a week.   Increase activity slowly   Complete by:  As directed      Allergies as of 03/19/2018   No Known Allergies     Medication List    STOP taking these medications   carvedilol 25 MG tablet Commonly known as:  COREG     TAKE these medications   albuterol 108 (90 Base) MCG/ACT inhaler Commonly known as:  PROVENTIL HFA;VENTOLIN HFA Inhale 2 puffs into the lungs every 4 (four) hours as needed for wheezing or shortness of breath (or coughing). What changed:  how much to take   amoxicillin-clavulanate 875-125 MG tablet Commonly known as:  AUGMENTIN Take 1 tablet by mouth 2 (two) times daily for 2 days.   aspirin 81 MG tablet Take 81 mg by mouth daily.   atorvastatin 40 MG tablet Commonly known as:  LIPITOR Take 1 tablet (40 mg total) by mouth  daily.   cholecalciferol 1000 units tablet Commonly known as:  VITAMIN D Take 1,000 Units by mouth daily.   clopidogrel 75 MG tablet Commonly known as:  PLAVIX Take 75 mg by mouth daily at 3 pm.   docusate sodium 100 MG capsule Commonly known as:  COLACE Take 1 capsule (100 mg total) by mouth daily as needed for mild constipation.   ezetimibe 10 MG tablet Commonly known as:  ZETIA Take 10 mg by mouth daily.   ferrous sulfate 325 (65 FE) MG tablet Take 325 mg by mouth 2 (two) times daily with a meal.   furosemide 20 MG tablet Commonly known as:  LASIX Take 1 tablet (20 mg total) by mouth daily. What changed:  how much to take   guaiFENesin 100 MG/5ML Soln Commonly known as:  ROBITUSSIN Take 5 mLs by mouth every 4 (four) hours as needed for cough or to loosen phlegm.   guaiFENesin-dextromethorphan 100-10 MG/5ML syrup Commonly known as:  ROBITUSSIN DM Take 10 mLs by mouth every 6 (six) hours.   ICY HOT EX Apply 1 application topically daily as needed (aches and pains).   ipratropium-albuterol 0.5-2.5 (3) MG/3ML Soln Commonly known as:  DUONEB Take 3 mLs by nebulization 2 (two) times daily.   isosorbide mononitrate 60 MG 24 hr tablet Commonly known as:  IMDUR  Take 90mg  (1.5 tablets) in the AM and 30mg  (1/2 tablet) in the PM What changed:    how much to take  how to take this  when to take this  additional instructions   latanoprost 0.005 % ophthalmic solution Commonly known as:  XALATAN Place 1 drop into both eyes 2 (two) times a week.   levothyroxine 125 MCG tablet Commonly known as:  SYNTHROID, LEVOTHROID TAKE 1 TABLET BY MOUTH EVERY DAY BEFORE BREAKFAST What changed:  See the new instructions.   metoprolol tartrate 50 MG tablet Commonly known as:  LOPRESSOR Take 1 tablet (50 mg total) by mouth 2 (two) times daily.   nitroGLYCERIN 0.4 MG SL tablet Commonly known as:  NITROSTAT PLACE 1 TABLET UNDER THE TONGUE AS NEEDED. What changed:  See the new  instructions.   OVER THE COUNTER MEDICATION Take 1 capsule by mouth daily as needed ("Prune-Lax" for constipation).   pantoprazole 40 MG tablet Commonly known as:  PROTONIX Take 40 mg by mouth 2 (two) times daily.   polyethylene glycol packet Commonly known as:  MIRALAX / GLYCOLAX Take 17 g by mouth daily as needed for moderate constipation.   predniSONE 10 MG tablet Commonly known as:  DELTASONE Take 40 mg daily for 3 days then 30 mg daily for 3 days then 20 mg daily for 3 days then 10 mg daily for 3 days then stop. What changed:    medication strength  additional instructions   ranolazine 1000 MG SR tablet Commonly known as:  RANEXA Take 1 tablet (1,000 mg total) by mouth 2 (two) times daily.   tamsulosin 0.4 MG Caps capsule Commonly known as:  FLOMAX Take 0.4 mg by mouth daily.       Contact information for follow-up providers    Seward Carol, MD. Schedule an appointment as soon as possible for a visit in 1 week(s).   Specialty:  Internal Medicine Contact information: [address] [place] [address] [phone]        Troy Sine, MD. Schedule an appointment as soon as possible for a visit in 2 week(s).   Specialty:  Cardiology Contact information: [address] [place] [address] [phone]        Rigoberto Noel, MD Follow up in 2 week(s).   Specialty:  Pulmonary Disease Contact information: [address] [address] [phone]            Contact information for after-discharge care    [facility] .   Service:  Skilled Nursing Contact information: [address] [place] [phone]                 No Known Allergies  Consultations:  Cardiology   Procedures/Studies: Dg Chest 2 View  Result Date: 02/22/2018 CLINICAL DATA:  Wheezing with shortness of breath EXAM: CHEST - 2 VIEW COMPARISON:  05/18/2017 FINDINGS:  Post sternotomy changes. Left-sided pacing device as before. Trace pleural effusions. No focal consolidation. Mild cardiomegaly with aortic atherosclerosis. No pneumothorax. IMPRESSION: 1. Trace pleural effusions. 2. Mild cardiomegaly Electronically Signed   By: Donavan Foil M.D.   On: 02/22/2018 02:15   Ct Head Wo Contrast  Result Date: 02/23/2018 CLINICAL DATA:  84 y/o  M; carotid stenosis. EXAM: CT HEAD WITHOUT CONTRAST TECHNIQUE: Contiguous axial images were obtained from the base of the skull through the vertex without intravenous contrast. COMPARISON:  02/22/2018 CT head. FINDINGS:  Brain: No evidence of acute infarction, hemorrhage, hydrocephalus, extra-axial collection or mass lesion/mass effect. Mild chronic microvascular ischemic changes and moderate brain parenchymal volume loss. Vascular: Severe calcific atherosclerosis of the carotid siphons. No hyperdense vessel. Skull: Normal. Negative for fracture or focal lesion. Sinuses/Orbits: Partial opacification of right posterior ethmoid air cells. Bilateral intra-ocular lens replacement. Otherwise unremarkable. Other: None. IMPRESSION: 1. No acute intracranial abnormality. 2. Stable mild chronic microvascular ischemic changes and moderate parenchymal volume loss of the brain. Electronically Signed   By: Kristine Garbe M.D.   On: 02/23/2018 22:10   Ct Head Wo Contrast  Result Date: 02/22/2018 CLINICAL DATA:  Acute onset of generalized weakness. Abnormal gait. Left-sided facial droop. EXAM: CT HEAD WITHOUT CONTRAST TECHNIQUE: Contiguous axial images were obtained from the base of the skull through the vertex without intravenous contrast. COMPARISON:  CT of the head performed 05/09/2015 FINDINGS: Brain: No evidence of acute infarction, hemorrhage, hydrocephalus, extra-axial collection or mass lesion / mass effect. Prominence of the ventricles and sulci reflects moderate cortical volume loss. Cerebellar atrophy is noted. Scattered  periventricular and subcortical white matter change likely reflects small vessel ischemic microangiopathy. The brainstem and fourth ventricle are within normal limits. The basal ganglia are unremarkable in appearance. The cerebral hemispheres demonstrate grossly normal gray-white differentiation. No mass effect or midline shift is seen. Vascular: No hyperdense vessel or unexpected calcification. Skull: There is no evidence of fracture; visualized osseous structures are unremarkable in appearance. Sinuses/Orbits: The orbits are within normal limits. There is partial opacification of the right ethmoid air cells. The paranasal sinuses and mastoid air cells are well-aerated. Other: No significant soft tissue abnormalities are seen. IMPRESSION: 1. No acute intracranial pathology seen on CT. 2. Moderate cortical volume loss and scattered small vessel ischemic microangiopathy. Electronically Signed   By: Garald Balding M.D.   On: 02/22/2018 02:10   Ct Soft Tissue Neck Wo Contrast  Result Date: 02/23/2018 CLINICAL DATA:  84 y/o  M; carotid stenosis. EXAM: CT NECK WITHOUT CONTRAST TECHNIQUE: Multidetector CT imaging of the neck was performed following the standard protocol without intravenous contrast. COMPARISON:  02/22/2018 CT of the head. FINDINGS: Pharynx and larynx: Normal. No mass or swelling. Salivary glands: 12 mm nodule within the superficial lobe of the left parotid gland. Thyroid: Normal. Lymph nodes: None enlarged or abnormal density. Vascular: Calcific atherosclerosis of the aortic arch and carotid siphons. 2 lead pacemaker. Postsurgical changes related to CABG with LIMA and saphenous grafts partially visualized. Limited intracranial: Negative. Visualized orbits: Negative. Mastoids and visualized paranasal sinuses: Clear. Skeleton: No acute or aggressive process. Advanced cervical spondylosis with multilevel facet and degenerative changes. Flowing anterior ossification compatible with DISH. Healed partially  visualize median sternotomy. Upper chest: Negative. Other: 17 mm well-circumscribed low-attenuation dermal lesion of right lateral neck below the right mastoid an 23 mm well-circumscribed dermal low-attenuation lesion of the left face anterior to left parotid gland, likely dermal appendage cyst. IMPRESSION: 1. Calcific atherosclerosis of aortic arch and carotid bifurcation. Status post CABG. 2. Dermal lesions in the right lateral neck below mastoid process and left face anterior to parotid gland, likely dermal appendage cysts. 3. Indeterminate 12 mm nodule within superficial lobe of left parotid gland may represent adenopathy or parotid neoplasm. Follow-up is recommended to ensure stability or further evaluation with facial MRI with and without contrast. 4. Advanced cervical spondylosis.  Findings of DISH. Electronically Signed   By: Kristine Garbe M.D.   On: 02/23/2018 22:20   Dg Chest [facility] 1 View  Result  Date: 03/18/2018 CLINICAL DATA:  Shortness of breath EXAM: PORTABLE CHEST 1 VIEW COMPARISON:  03/17/2018 FINDINGS: Cardiac shadow is enlarged. Postsurgical changes and pacing device are again seen and stable. Stable infiltrates are noted in the left base and right upper lobe. No new focal abnormality is seen. IMPRESSION: No significant change from the previous exam. Electronically Signed   By: Inez Catalina M.D.   On: 03/18/2018 12:51   Dg Chest Port 1 View  Result Date: 03/17/2018 CLINICAL DATA:  84 year old male with acute respiratory distress EXAM: PORTABLE CHEST 1 VIEW COMPARISON:  Chest radiograph dated 03/14/2018 FINDINGS: There is mild underlying interstitial edema. Bilateral streaky and hazy interstitial and airspace densities primarily involving the right upper lung field and left lung base may represent edema or pneumonia. Clinical correlation is recommended. There is a small left pleural effusion. No pneumothorax. Stable cardiac silhouette. Median sternotomy wires and CABG vascular  clips. Left pectoral pacemaker device. No acute osseous pathology. IMPRESSION: Mild interstitial edema. Areas of streaky density in the right upper lobe and left lung base may represent superimposed pneumonia. Overall no significant interval change compared to prior radiograph. Small left pleural effusion. Electronically Signed   By: Anner Crete M.D.   On: 03/17/2018 03:58   Dg Chest Portable 1 View  Result Date: 03/14/2018 CLINICAL DATA:  History of CHF.  Cough. EXAM: PORTABLE CHEST 1 VIEW COMPARISON:  February 22, 2018 FINDINGS: Stable cardiomegaly. Mild bilateral pulmonary opacities are somewhat patchy in appearance but more focal in the right suprahilar region and left base. The hila and mediastinum are stable. No pneumothorax. No other acute abnormalities. IMPRESSION: 1. Bilateral pulmonary opacities are somewhat patchy in appearance, more focal in the right suprahilar region and left base. While asymmetric edema is possible, the patchy nature suggests the possibility of multifocal pneumonia. Recommend clinical correlation and follow-up to resolution. Electronically Signed   By: Dorise Bullion III M.D   On: 03/14/2018 10:39   Dg Swallowing Func-speech Pathology  Result Date: 03/17/2018 Objective Swallowing Evaluation: Type of Study: MBS-Modified Barium Swallow Study  Patient Details Name: EAN GETTEL MRN: [id number] Date of Birth: [birth year] Today's Date: 03/17/2018 Time: SLP Start Time (ACUTE ONLY): 6789 -SLP Stop Time (ACUTE ONLY): 1059 SLP Time Calculation (min) (ACUTE ONLY): 10 min Past Medical History: Past Medical History: Diagnosis Date . Anemia  . Arthritis  . CAD (coronary artery disease)   a. CABG '89; b. PCI '04; c. 11/2015 Cath/PCI: LM 20ost, LAD 139m, LCX 30p, OM2 40, RCA 30p, 10p ISR, 90d (3.0x12 Synergy DES), AM 90, VG->OM2 known to be 100, LIMA->LAD not injected, patent in 2015. . Carotid stenosis, right  . Chronic combined systolic and diastolic CHF (congestive heart failure) ([place])  .  CKD (chronic kidney disease), stage III ([place])  . H/O: GI bleed   REMOTE HISTORY . History of COPD  . Hyperlipidemia  . Hypertensive heart disease  . LBBB (left bundle branch block)  . Pacemaker Sept 2009  St Jude . PAF (paroxysmal atrial fibrillation) (HCC)   a. not on anticoag due to prior GIB. . Sick sinus syndrome [facility]) Sept 2009  ST Jude PTVDP Past Surgical History: Past Surgical History: Procedure Laterality Date . CARDIAC CATHETERIZATION  11/2011  EF 50%; significant native CAD w/80% stenosis of the LAD after 2nd giagonal vessel and septal perforating artery w/total occlusion of the mid left anterior descendiung.; 60-70% ostiasl stenosis in the circumflex vessel followed by 40% proximal stenosis and 70-80% distal circumflex stenosis;  . CARDIAC CATHETERIZATION  N/A 11/29/2015  Procedure: Left Heart Cath and Cors/Grafts Angiography;  Surgeon: Burnell Blanks, MD;  Location: [place] [facility];  Service: Cardiovascular;  Laterality: N/A; . CARDIAC CATHETERIZATION N/A 11/29/2015  Procedure: Coronary Stent Intervention;  Surgeon: Burnell Blanks, MD;  Location: [place] [facility];  Service: Cardiovascular;  Laterality: N/A; . CATARACT EXTRACTION  2004 . CORONARY ANGIOGRAPHY N/A 02/25/2017  Procedure: Coronary Angiography;  Surgeon: Belva Crome, MD;  Location: [place] [facility] LAB;  Service: Cardiovascular;  Laterality: N/A; . CORONARY ANGIOPLASTY WITH STENT PLACEMENT  2004  RCA . CORONARY ARTERY BYPASS GRAFT  1989  had LIMA to his LAD, a vein to the circumflex. In 2004 underwent stenting to his right coronary artery. . CORONARY STENT INTERVENTION N/A 02/24/2017  Procedure: Coronary Stent Intervention;  Surgeon: Wellington Hampshire, MD;  Location: [place] CV LAB;  Service: Cardiovascular;  Laterality: N/A;  cfx . HEMORRHOID SURGERY   . INSERT / REPLACE / REMOVE PACEMAKER  07/17/08  DUAL-CHAMBER; PPM-ST.JUDE [employer]NET . LEFT HEART CATH AND CORS/GRAFTS ANGIOGRAPHY N/A 02/24/2017  Procedure: Left Heart Cath and  Cors/Grafts Angiography;  Surgeon: Wellington Hampshire, MD;  Location: [place] [facility];  Service: Cardiovascular;  Laterality: N/A; . LEFT HEART CATHETERIZATION WITH CORONARY/GRAFT ANGIOGRAM N/A 12/10/2011  Procedure: LEFT HEART CATHETERIZATION WITH Beatrix Fetters;  Surgeon: Troy Sine, MD;  Location: [facility] CATH LAB;  Service: Cardiovascular;  Laterality: N/A; . LEFT HEART CATHETERIZATION WITH CORONARY/GRAFT ANGIOGRAM N/A 01/26/2014  Procedure: LEFT HEART CATHETERIZATION WITH Beatrix Fetters;  Surgeon: Troy Sine, MD;  Location: [facility] CATH LAB;  Service: Cardiovascular;  Laterality: N/A; . PPM GENERATOR CHANGEOUT N/A 12/15/2017  Procedure: PPM GENERATOR CHANGEOUT;  Surgeon: Evans Lance, MD;  Location: [place] CV LAB;  Service: Cardiovascular;  Laterality: N/A; . PROSTATECTOMY   HPI: Pt is a 84 y.o. male admitted with sepsis secondary to PNA. He had a recent admission 4/16-4/18 for CVA, at which time he passed the RN stroke swallow screen. Prior swallowing evaluation was completed in July 2016 with recommendation for regular textures and thin liquids. CT Neck in April 2019 showed advanced cervical spondylosis, findings of DISH. PMH also includes: afib not on AC due to GI bleeding; pacemaker placement; HTN; HLD: stage 3 CKD; CAD s/p CABG and PCI; R carotid stenosis; and chronic combined heart failure  Subjective: pt describes dysphagia including globus sensation, coughing with intake that has been ongoing for the past year or so Assessment / Plan / Recommendation CHL IP CLINICAL IMPRESSIONS 03/16/2018 Clinical Impression Pt has a mild oropharyngeal dysphagia characterized by lingual pumping and weak lingual manipulation for A/P transit. Pt was not able to clear the barium tablet from his mouth with liquids despite multiple boluses, ultimately needing a bite of puree. Swallow initiation is intermittently delayed, with boluses resting primarily in the valleculae prior to swallow trigger. Changes  to the cervical spine as described on CT Neck were noted but did not impede bolus flow. Pt had adequate airway protection with only intermittent, trace penetration with thin liquids that cleared spontaneously upon completion of the swallow. Recommend that he initiate regular diet textures and thin liquids; meds whole in puree. SLP will f/u briefly for tolerance given concern for PNA.  SLP Visit Diagnosis Dysphagia, oropharyngeal phase (R13.12) Attention and concentration deficit following -- Frontal lobe and executive function deficit following -- Impact on safety and function --   CHL IP TREATMENT RECOMMENDATION 03/15/2018 Treatment Recommendations Therapy as outlined in treatment plan below  Prognosis 03/15/2018 Prognosis for Safe Diet Advancement Good Barriers to Reach Goals -- Barriers/Prognosis Comment -- CHL IP DIET RECOMMENDATION 03/16/2018 SLP Diet Recommendations -- Liquid Administration via -- Medication Administration -- Compensations Slow rate;Small sips/bites Postural Changes --   CHL IP OTHER RECOMMENDATIONS 03/15/2018 Recommended Consults -- Oral Care Recommendations Oral care BID Other Recommendations --   CHL IP FOLLOW UP RECOMMENDATIONS 03/16/2018 Follow up Recommendations None   CHL IP FREQUENCY AND DURATION 03/15/2018 Speech Therapy Frequency (ACUTE ONLY) min 1 x/week Treatment Duration 1 week      CHL IP ORAL PHASE 03/15/2018 Oral Phase Impaired Oral - Pudding Teaspoon -- Oral - Pudding Cup -- Oral - Honey Teaspoon -- Oral - Honey Cup -- Oral - Nectar Teaspoon -- Oral - Nectar Cup -- Oral - Nectar Straw -- Oral - Thin Teaspoon -- Oral - Thin Cup Lingual pumping;Delayed oral transit Oral - Thin Straw Lingual pumping;Delayed oral transit Oral - Puree Lingual pumping;Delayed oral transit Oral - Mech Soft Lingual pumping;Delayed oral transit Oral - Regular -- Oral - Multi-Consistency -- Oral - Pill Lingual pumping;Delayed oral transit Oral Phase - Comment --  CHL IP PHARYNGEAL PHASE 03/15/2018 Pharyngeal Phase  Impaired Pharyngeal- Pudding Teaspoon -- Pharyngeal -- Pharyngeal- Pudding Cup -- Pharyngeal -- Pharyngeal- Honey Teaspoon -- Pharyngeal -- Pharyngeal- Honey Cup -- Pharyngeal -- Pharyngeal- Nectar Teaspoon -- Pharyngeal -- Pharyngeal- Nectar Cup -- Pharyngeal -- Pharyngeal- Nectar Straw -- Pharyngeal -- Pharyngeal- Thin Teaspoon -- Pharyngeal -- Pharyngeal- Thin Cup Delayed swallow initiation-vallecula Pharyngeal -- Pharyngeal- Thin Straw Delayed swallow initiation-vallecula Pharyngeal -- Pharyngeal- Puree Delayed swallow initiation-vallecula Pharyngeal -- Pharyngeal- Mechanical Soft Delayed swallow initiation-vallecula Pharyngeal -- Pharyngeal- Regular -- Pharyngeal -- Pharyngeal- Multi-consistency -- Pharyngeal -- Pharyngeal- Pill WFL Pharyngeal -- Pharyngeal Comment --  CHL IP CERVICAL ESOPHAGEAL PHASE 03/15/2018 Cervical Esophageal Phase WFL Pudding Teaspoon -- Pudding Cup -- Honey Teaspoon -- Honey Cup -- Nectar Teaspoon -- Nectar Cup -- Nectar Straw -- Thin Teaspoon -- Thin Cup -- Thin Straw -- Puree -- Mechanical Soft -- Regular -- Multi-consistency -- Pill -- Cervical Esophageal Comment -- No flowsheet data found. Germain Osgood 03/17/2018, 8:06 AM  Germain Osgood, M.A. CCC-SLP [phone]                Subjective:  Patient seen and examined at bedside this morning.  This morning he looks comfortable.  Off oxygen.  Respiratory status is stable.  Stable for discharge to skilled nursing facility today. Discharge Exam: Vitals:   03/19/18 0644 03/19/18 0837  BP:  (!) 122/55  Pulse:  (!) 57  Resp:  (!) 27  Temp:  98.9 F (37.2 C)  SpO2: 94% 92%   Vitals:   03/19/18 0331 03/19/18 0500 03/19/18 0644 03/19/18 0837  BP:    (!) 122/55  Pulse:    (!) 57  Resp:    (!) 27  Temp:    98.9 F (37.2 C)  TempSrc:    Oral  SpO2: 91%  94% 92%  Weight:  84 kg (185 lb 3 oz)    Height:        General: Pt is alert, awake, not in acute distress Cardiovascular: RRR, S1/S2 +, no rubs, no  gallops Respiratory: Bilateral decreased air entry, rhonchi, mild expiratory wheezes Abdominal: Soft, NT, ND, bowel sounds + Extremities: no edema, no cyanosis    The results of significant diagnostics from this hospitalization (including imaging, microbiology, ancillary and laboratory) are listed below for reference.     Microbiology: Recent Results (from the  past 240 hour(s))  Culture, blood (Routine x 2)     Status: None (Preliminary result)   Collection Time: 03/14/18 10:23 AM  Result Value Ref Range Status   Specimen Description BLOOD BLOOD RIGHT WRIST  Final   Special Requests   Final    BOTTLES DRAWN AEROBIC AND ANAEROBIC Blood Culture adequate volume   Culture   Final    NO GROWTH 4 DAYS Performed at [facility], [address], [place], [place] [address]    Report Status PENDING  Incomplete  Culture, blood (Routine x 2)     Status: None (Preliminary result)   Collection Time: 03/14/18 10:28 AM  Result Value Ref Range Status   Specimen Description BLOOD LEFT ANTECUBITAL  Final   Special Requests   Final    BOTTLES DRAWN AEROBIC AND ANAEROBIC Blood Culture adequate volume   Culture   Final    NO GROWTH 4 DAYS Performed at [facility], [place] [address] [place], [place] [address]    Report Status PENDING  Incomplete  Respiratory Panel by PCR     Status: None   Collection Time: 03/14/18 11:32 AM  Result Value Ref Range Status   Adenovirus NOT DETECTED NOT DETECTED Final   Coronavirus 229E NOT DETECTED NOT DETECTED Final   Coronavirus HKU1 NOT DETECTED NOT DETECTED Final   Coronavirus NL63 NOT DETECTED NOT DETECTED Final   Coronavirus OC43 NOT DETECTED NOT DETECTED Final   Metapneumovirus NOT DETECTED NOT DETECTED Final   Rhinovirus / Enterovirus NOT DETECTED NOT DETECTED Final   Influenza A NOT DETECTED NOT DETECTED Final   Influenza B NOT DETECTED NOT DETECTED Final   Parainfluenza Virus 1 NOT DETECTED NOT DETECTED Final   Parainfluenza Virus 2 NOT  DETECTED NOT DETECTED Final   Parainfluenza Virus 3 NOT DETECTED NOT DETECTED Final   Parainfluenza Virus 4 NOT DETECTED NOT DETECTED Final   Respiratory Syncytial Virus NOT DETECTED NOT DETECTED Final   Bordetella pertussis NOT DETECTED NOT DETECTED Final   Chlamydophila pneumoniae NOT DETECTED NOT DETECTED Final   Mycoplasma pneumoniae NOT DETECTED NOT DETECTED Final    Comment: Performed at [facility], [place] [address], [place], [place] [address]  MRSA PCR Screening     Status: None   Collection Time: 03/17/18  3:28 PM  Result Value Ref Range Status   MRSA by PCR NEGATIVE NEGATIVE Final    Comment:        The GeneXpert MRSA Assay (FDA approved for NASAL specimens only), is one component of a comprehensive MRSA colonization surveillance program. It is not intended to diagnose MRSA infection nor to guide or monitor treatment for MRSA infections. Performed at [facility], [place] [address], [place], [place] [address]      Labs: BNP (last 3 results) Recent Labs    05/18/17 1953 02/22/18 0143 03/14/18 1023  BNP 276.5* 433.7* 790.2*   Basic Metabolic Panel: Recent Labs  Lab 03/14/18 1212 03/15/18 0354 03/16/18 0243 03/17/18 0219 03/19/18 0228  NA 134* 137 136 134* 135  K 3.7 4.4 4.2 4.8 4.5  CL 101 107 108 106 104  CO2 23 20* 20* 19* 26  GLUCOSE 101* 120* 147* 195* 109*  BUN 26* 29* 36* 36* 28*  CREATININE 1.72* 1.63* 1.53* 1.56* 1.39*  CALCIUM 8.1* 8.0* 8.1* 8.4* 8.0*  MG  --   --  1.9  --   --    Liver Function Tests: Recent Labs  Lab 03/14/18  1212 03/15/18 0354  AST 15 39  ALT 24 43  ALKPHOS 47 52  BILITOT 1.6* 1.6*  PROT 5.9* 5.9*  ALBUMIN 2.7* 2.5*   No results for input(s): LIPASE, AMYLASE in the last 168 hours. No results for input(s): AMMONIA in the last 168 hours. CBC: Recent Labs  Lab 03/14/18 1023 03/16/18 0243 03/19/18 0228  WBC 2.7* 4.9 5.4  NEUTROABS 1.9  --  4.3  HGB 9.9* 9.0* 9.0*  HCT 29.7* 27.1* 27.5*  MCV 97.7 94.4  95.5  PLT 81* 91* 118*   Cardiac Enzymes: Recent Labs  Lab 03/17/18 1928 03/18/18 0226 03/18/18 1001 03/18/18 1526 03/18/18 2126  TROPONINI 2.57* 2.98* 1.93* 2.14* 1.62*   BNP: Invalid input(s): POCBNP CBG: No results for input(s): GLUCAP in the last 168 hours. D-Dimer No results for input(s): DDIMER in the last 72 hours. Hgb A1c No results for input(s): HGBA1C in the last 72 hours. Lipid Profile No results for input(s): CHOL, HDL, LDLCALC, TRIG, CHOLHDL, LDLDIRECT in the last 72 hours. Thyroid function studies No results for input(s): TSH, T4TOTAL, T3FREE, THYROIDAB in the last 72 hours.  Invalid input(s): FREET3 Anemia work up No results for input(s): VITAMINB12, FOLATE, FERRITIN, TIBC, IRON, RETICCTPCT in the last 72 hours. Urinalysis    Component Value Date/Time   COLORURINE AMBER (A) 03/14/2018 1023   APPEARANCEUR CLEAR 03/14/2018 1023   LABSPEC 1.025 03/14/2018 1023   PHURINE 5.0 03/14/2018 1023   GLUCOSEU NEGATIVE 03/14/2018 1023   HGBUR NEGATIVE 03/14/2018 1023   BILIRUBINUR SMALL (A) 03/14/2018 1023   KETONESUR NEGATIVE 03/14/2018 1023   PROTEINUR NEGATIVE 03/14/2018 1023   UROBILINOGEN 0.2 05/09/2015 1523   NITRITE NEGATIVE 03/14/2018 1023   LEUKOCYTESUR NEGATIVE 03/14/2018 1023   Sepsis Labs Invalid input(s): PROCALCITONIN,  WBC,  LACTICIDVEN Microbiology Recent Results (from the past 240 hour(s))  Culture, blood (Routine x 2)     Status: None (Preliminary result)   Collection Time: 03/14/18 10:23 AM  Result Value Ref Range Status   Specimen Description BLOOD BLOOD RIGHT WRIST  Final   Special Requests   Final    BOTTLES DRAWN AEROBIC AND ANAEROBIC Blood Culture adequate volume   Culture   Final    NO GROWTH 4 DAYS Performed at [facility], [place] [address], [place], [place] [address]    Report Status PENDING  Incomplete  Culture, blood (Routine x 2)     Status: None (Preliminary result)   Collection Time: 03/14/18 10:28 AM  Result Value  Ref Range Status   Specimen Description BLOOD LEFT ANTECUBITAL  Final   Special Requests   Final    BOTTLES DRAWN AEROBIC AND ANAEROBIC Blood Culture adequate volume   Culture   Final    NO GROWTH 4 DAYS Performed at [facility], [place] [address] [place], [place] [address]    Report Status PENDING  Incomplete  Respiratory Panel by PCR     Status: None   Collection Time: 03/14/18 11:32 AM  Result Value Ref Range Status   Adenovirus NOT DETECTED NOT DETECTED Final   Coronavirus 229E NOT DETECTED NOT DETECTED Final   Coronavirus HKU1 NOT DETECTED NOT DETECTED Final   Coronavirus NL63 NOT DETECTED NOT DETECTED Final   Coronavirus OC43 NOT DETECTED NOT DETECTED Final   Metapneumovirus NOT DETECTED NOT DETECTED Final   Rhinovirus / Enterovirus NOT DETECTED NOT DETECTED Final   Influenza A NOT DETECTED NOT DETECTED Final   Influenza B NOT DETECTED NOT DETECTED Final  Parainfluenza Virus 1 NOT DETECTED NOT DETECTED Final   Parainfluenza Virus 2 NOT DETECTED NOT DETECTED Final   Parainfluenza Virus 3 NOT DETECTED NOT DETECTED Final   Parainfluenza Virus 4 NOT DETECTED NOT DETECTED Final   Respiratory Syncytial Virus NOT DETECTED NOT DETECTED Final   Bordetella pertussis NOT DETECTED NOT DETECTED Final   Chlamydophila pneumoniae NOT DETECTED NOT DETECTED Final   Mycoplasma pneumoniae NOT DETECTED NOT DETECTED Final    Comment: Performed at [facility], [place] [address], [place], [place] [address]  MRSA PCR Screening     Status: None   Collection Time: 03/17/18  3:28 PM  Result Value Ref Range Status   MRSA by PCR NEGATIVE NEGATIVE Final    Comment:        The GeneXpert MRSA Assay (FDA approved for NASAL specimens only), is one component of a comprehensive MRSA colonization surveillance program. It is not intended to diagnose MRSA infection nor to guide or monitor treatment for MRSA infections. Performed at [facility], [place] [address] [place], [place] [address]       Time coordinating discharge: 35 minutes  SIGNED:   Shelly Coss, MD  [facility] 03/19/2018, 11:13 AM Pager [phone]  If 7PM-7AM, please contact night-coverage [email or web address] Password [email or web address]

## 2018-03-19 NOTE — Progress Notes (Signed)
On call social work and case management paged the following via [place]:  "2W13: [facility] [place]. Needs transportation. And I need a contact and conformation of bed. Thank you. paging social work to"

## 2018-03-19 NOTE — Progress Notes (Signed)
On call social worker and case manager paged via [place] to assist in patients d/c and transport. Will await call back.   English as a second language teacher

## 2018-03-19 NOTE — Progress Notes (Signed)
The patient is discharging to another healthcare facility, [facility], for continuation of treatment. The patient's medical information has been shared with the receiving healthcare facility. Report given to [facility], LPN. Patient's belongings given to family upon discharge. PIV and telemetry D/C  [place] 03/19/2018, 4:04 PM

## 2018-03-19 NOTE — Progress Notes (Signed)
CSW notified by MD that patient stable for discharge. CSW checked in with Admissions at [facility] that bed was available today and patient could admit under LOG pending auth.   CSW alerted MD that patient could discharge to SNF when ready, awaiting order and summary. CSW to follow.  Laveda Abbe, [place] Clinical Social Worker [phone]

## 2018-03-21 DIAGNOSIS — J189 Pneumonia, unspecified organism: Secondary | ICD-10-CM | POA: Diagnosis not present

## 2018-03-21 DIAGNOSIS — D649 Anemia, unspecified: Secondary | ICD-10-CM | POA: Diagnosis not present

## 2018-03-21 DIAGNOSIS — J9620 Acute and chronic respiratory failure, unspecified whether with hypoxia or hypercapnia: Secondary | ICD-10-CM | POA: Diagnosis not present

## 2018-03-21 DIAGNOSIS — J449 Chronic obstructive pulmonary disease, unspecified: Secondary | ICD-10-CM | POA: Diagnosis not present

## 2018-03-23 DIAGNOSIS — J449 Chronic obstructive pulmonary disease, unspecified: Secondary | ICD-10-CM | POA: Diagnosis not present

## 2018-03-23 DIAGNOSIS — N183 Chronic kidney disease, stage 3 (moderate): Secondary | ICD-10-CM | POA: Diagnosis not present

## 2018-03-23 DIAGNOSIS — J189 Pneumonia, unspecified organism: Secondary | ICD-10-CM | POA: Diagnosis not present

## 2018-03-23 DIAGNOSIS — I480 Paroxysmal atrial fibrillation: Secondary | ICD-10-CM | POA: Diagnosis not present

## 2018-03-24 DIAGNOSIS — J9620 Acute and chronic respiratory failure, unspecified whether with hypoxia or hypercapnia: Secondary | ICD-10-CM | POA: Diagnosis not present

## 2018-03-24 DIAGNOSIS — R609 Edema, unspecified: Secondary | ICD-10-CM | POA: Diagnosis not present

## 2018-03-28 DIAGNOSIS — I4891 Unspecified atrial fibrillation: Secondary | ICD-10-CM | POA: Diagnosis not present

## 2018-03-28 DIAGNOSIS — R278 Other lack of coordination: Secondary | ICD-10-CM | POA: Diagnosis not present

## 2018-03-28 DIAGNOSIS — J189 Pneumonia, unspecified organism: Secondary | ICD-10-CM | POA: Diagnosis not present

## 2018-03-28 DIAGNOSIS — M25579 Pain in unspecified ankle and joints of unspecified foot: Secondary | ICD-10-CM | POA: Diagnosis not present

## 2018-03-28 DIAGNOSIS — J449 Chronic obstructive pulmonary disease, unspecified: Secondary | ICD-10-CM | POA: Diagnosis not present

## 2018-03-28 DIAGNOSIS — R269 Unspecified abnormalities of gait and mobility: Secondary | ICD-10-CM | POA: Diagnosis not present

## 2018-03-28 DIAGNOSIS — R471 Dysarthria and anarthria: Secondary | ICD-10-CM | POA: Diagnosis not present

## 2018-03-28 DIAGNOSIS — J9620 Acute and chronic respiratory failure, unspecified whether with hypoxia or hypercapnia: Secondary | ICD-10-CM | POA: Diagnosis not present

## 2018-03-28 DIAGNOSIS — J90 Pleural effusion, not elsewhere classified: Secondary | ICD-10-CM | POA: Diagnosis not present

## 2018-03-28 DIAGNOSIS — Z950 Presence of cardiac pacemaker: Secondary | ICD-10-CM | POA: Diagnosis not present

## 2018-03-28 DIAGNOSIS — G459 Transient cerebral ischemic attack, unspecified: Secondary | ICD-10-CM | POA: Diagnosis not present

## 2018-03-28 DIAGNOSIS — I2510 Atherosclerotic heart disease of native coronary artery without angina pectoris: Secondary | ICD-10-CM | POA: Diagnosis not present

## 2018-03-28 DIAGNOSIS — R079 Chest pain, unspecified: Secondary | ICD-10-CM | POA: Diagnosis not present

## 2018-03-28 DIAGNOSIS — I5040 Unspecified combined systolic (congestive) and diastolic (congestive) heart failure: Secondary | ICD-10-CM | POA: Diagnosis not present

## 2018-03-28 DIAGNOSIS — M6281 Muscle weakness (generalized): Secondary | ICD-10-CM | POA: Diagnosis not present

## 2018-03-28 DIAGNOSIS — R41841 Cognitive communication deficit: Secondary | ICD-10-CM | POA: Diagnosis not present

## 2018-03-28 DIAGNOSIS — R52 Pain, unspecified: Secondary | ICD-10-CM | POA: Diagnosis not present

## 2018-03-28 DIAGNOSIS — R2689 Other abnormalities of gait and mobility: Secondary | ICD-10-CM | POA: Diagnosis not present

## 2018-03-28 DIAGNOSIS — E785 Hyperlipidemia, unspecified: Secondary | ICD-10-CM | POA: Diagnosis not present

## 2018-03-28 DIAGNOSIS — N189 Chronic kidney disease, unspecified: Secondary | ICD-10-CM | POA: Diagnosis not present

## 2018-03-28 DIAGNOSIS — I10 Essential (primary) hypertension: Secondary | ICD-10-CM | POA: Diagnosis not present

## 2018-03-28 DIAGNOSIS — I480 Paroxysmal atrial fibrillation: Secondary | ICD-10-CM | POA: Diagnosis not present

## 2018-03-28 DIAGNOSIS — R69 Illness, unspecified: Secondary | ICD-10-CM | POA: Diagnosis not present

## 2018-03-29 DIAGNOSIS — N189 Chronic kidney disease, unspecified: Secondary | ICD-10-CM | POA: Diagnosis not present

## 2018-03-29 DIAGNOSIS — I4891 Unspecified atrial fibrillation: Secondary | ICD-10-CM | POA: Diagnosis not present

## 2018-03-29 DIAGNOSIS — J189 Pneumonia, unspecified organism: Secondary | ICD-10-CM | POA: Diagnosis not present

## 2018-03-29 DIAGNOSIS — J449 Chronic obstructive pulmonary disease, unspecified: Secondary | ICD-10-CM | POA: Diagnosis not present

## 2018-03-31 ENCOUNTER — Institutional Professional Consult (permissible substitution): Payer: Medicare HMO | Admitting: Pulmonary Disease

## 2018-03-31 ENCOUNTER — Institutional Professional Consult (permissible substitution): Payer: Medicare HMO | Admitting: Internal Medicine

## 2018-03-31 ENCOUNTER — Encounter: Payer: Medicare HMO | Admitting: Internal Medicine

## 2018-04-05 ENCOUNTER — Encounter: Payer: Self-pay | Admitting: Adult Health

## 2018-04-05 ENCOUNTER — Ambulatory Visit (INDEPENDENT_AMBULATORY_CARE_PROVIDER_SITE_OTHER): Payer: Medicare HMO | Admitting: Adult Health

## 2018-04-05 ENCOUNTER — Other Ambulatory Visit: Payer: Self-pay | Admitting: Cardiovascular Disease

## 2018-04-05 VITALS — BP 128/69 | HR 60 | Ht 69.0 in | Wt 164.0 lb

## 2018-04-05 DIAGNOSIS — I10 Essential (primary) hypertension: Secondary | ICD-10-CM | POA: Diagnosis not present

## 2018-04-05 DIAGNOSIS — E785 Hyperlipidemia, unspecified: Secondary | ICD-10-CM | POA: Diagnosis not present

## 2018-04-05 DIAGNOSIS — R471 Dysarthria and anarthria: Secondary | ICD-10-CM | POA: Diagnosis not present

## 2018-04-05 DIAGNOSIS — G459 Transient cerebral ischemic attack, unspecified: Secondary | ICD-10-CM | POA: Diagnosis not present

## 2018-04-05 DIAGNOSIS — I480 Paroxysmal atrial fibrillation: Secondary | ICD-10-CM | POA: Diagnosis not present

## 2018-04-05 NOTE — Progress Notes (Signed)
[facility] [address] [place]. [place] [address] [phone]       OFFICE FOLLOW UP NOTE  [name] Date of Birth:  [birth year] Medical Record Number:  [id number]   Reason for Referral:  hospital TIA follow up  CHIEF COMPLAINT:  Chief Complaint  Patient presents with  . Follow-up    Stroke follow up, pt was seen in hospital by Dr.Sethi, Pt is with daughter Dorian Pod in room pt in wheelchair , Pt is at [facility] may be gettin discharge    HPI: John Peck is being seen today for initial visit in the office for TIA on 02/22/18. History obtained from patient and chart review. Reviewed all radiology images and labs personally.  [name] is a 84 y.o. male with PMH significant for PAF not on AC due to GI bleed, HTN, HLD, COPD, sick sinus syndrome, pacemaker placement, CKD-3, CHF with EF of 40%, CAD, stent placement, CABG and deficiency anemia who presented with left facial droop, difficulty speaking, abnormal gait and recurrent episodes of near syncope upon standing possibly related to cerebral hypoperfusion  with high-grade right carotid stenosis on ultrasound. CT scan reviewed and showed no acute intracranial pathology.  Head ultrasound showed right carotid stenosis of 80 to 99%, left carotid stenosis of 40 to 59%, bilateral vertebral arteries demonstrate antegrade flow and subclavians showed normal flow hemodynamics bilaterally.  This is a technically difficult study due to extremely high bifurcation, vocal, and respiratory interference. Transcranial Doppler reviewed and showed elevated mean flow velocities in the left middle and anterior cerebral artery suggesting mild stenosis.  2D echo showed EF of 45 to 50%.  An MRI was unable to be obtained due to pacemaker.  He has history of atrial fibrillation but has not been on long-term anticoagulation given his remote history of GI bleed and advanced age.  A1c 5.8 and LDL 58.  It was recommended to  continue aspirin for secondary stroke prevention as patient is not a candidate for [facility] therapy.  Dr. Leonie Man discussed this case with Dr. Trula Slade during admission for possible vascular surgery consult to consider elective right carotid revascularization but patient seemed reluctant to consider angioplasty stenting since he has had several cardiac stents in the past.  Per vascular surgery, is was recommended to be evaluated for TCAR and to remain on Plavix and aspirin as well as statin. Transcarotid artery revascularization scheduled for 03/11/18 but daughter called stating they wanted to hold off on procedures at that time due to age and current condition. The patient was discharged home in stable condition with home PT.  On 03/14/18 patient was admitted for acute respiratory failure with hypoxia and healthcare associated pneumonia versus aspiration pneumonia.  Patient was started on broad-spectrum antibiotics.  Patient discharged to SNF to [facility] on 03/19/18.   Since discharge, patient has been living at [facility] where he receives PT/OT/ST.  He is currently sitting in wheelchair for long distance but is able to use rolling walker for short distance ambulation.  He continues to have speech difficulties with feels as though this is been improving.  He also continues his ambulation has been improving.  Continues to take aspirin and Plavix without side effects of bleeding or bruising.  Per daughter, patient has been on both aspirin and Plavix for some time now (unable to state exactly how long) for stroke prevention and cardiac issues such as CAD.  Continues to take Zetia and Lipitor without side effects of  myalgias.  Blood pressure today satisfactory 120/69.  Patient continues to want to hold off on transcarotid revascularization procedure and states that they will call when he is ready to proceed.  Continues to follow-up with cardiologist regarding atrial fibrillation and CAD management.  Denies new or  worsening stroke/TIA symptoms.   ROS:   14 system review of systems performed and negative with exception of walking difficulty  PMH:  Past Medical History:  Diagnosis Date  . Anemia   . Arthritis   . CAD (coronary artery disease)    a. CABG '89; b. PCI '04; c. 11/2015 Cath/PCI: LM 20ost, LAD 184m, LCX 30p, OM2 40, RCA 30p, 10p ISR, 90d (3.0x12 Synergy DES), AM 90, VG->OM2 known to be 100, LIMA->LAD not injected, patent in 2015.  . Carotid stenosis, right   . Chronic combined systolic and diastolic CHF (congestive heart failure) ([place])   . CKD (chronic kidney disease), stage III ([place])   . H/O: GI bleed    REMOTE HISTORY  . History of COPD   . Hyperlipidemia   . Hypertensive heart disease   . LBBB (left bundle branch block)   . Pacemaker Sept 2009   St Jude  . PAF (paroxysmal atrial fibrillation) (HCC)    a. not on anticoag due to prior GIB.  . Sick sinus syndrome [facility]) Sept 2009   ST Jude PTVDP  . Stroke [facility])     PSH:  Past Surgical History:  Procedure Laterality Date  . CARDIAC CATHETERIZATION  11/2011   EF 50%; significant native CAD w/80% stenosis of the LAD after 2nd giagonal vessel and septal perforating artery w/total occlusion of the mid left anterior descendiung.; 60-70% ostiasl stenosis in the circumflex vessel followed by 40% proximal stenosis and 70-80% distal circumflex stenosis;   . CARDIAC CATHETERIZATION N/A 11/29/2015   Procedure: Left Heart Cath and Cors/Grafts Angiography;  Surgeon: Burnell Blanks, MD;  Location: [place] CV LAB;  Service: Cardiovascular;  Laterality: N/A;  . CARDIAC CATHETERIZATION N/A 11/29/2015   Procedure: Coronary Stent Intervention;  Surgeon: Burnell Blanks, MD;  Location: [place] CV LAB;  Service: Cardiovascular;  Laterality: N/A;  . CATARACT EXTRACTION  2004  . CORONARY ANGIOGRAPHY N/A 02/25/2017   Procedure: Coronary Angiography;  Surgeon: Belva Crome, MD;  Location: [place] CV LAB;  Service: Cardiovascular;   Laterality: N/A;  . CORONARY ANGIOPLASTY WITH STENT PLACEMENT  2004   RCA  . CORONARY ARTERY BYPASS GRAFT  1989   had LIMA to his LAD, a vein to the circumflex. In 2004 underwent stenting to his right coronary artery.  . CORONARY STENT INTERVENTION N/A 02/24/2017   Procedure: Coronary Stent Intervention;  Surgeon: Wellington Hampshire, MD;  Location: [place] CV LAB;  Service: Cardiovascular;  Laterality: N/A;  cfx  . HEMORRHOID SURGERY    . INSERT / REPLACE / REMOVE PACEMAKER  07/17/08   DUAL-CHAMBER; PPM-ST.JUDE MEDNET  . LEFT HEART CATH AND CORS/GRAFTS ANGIOGRAPHY N/A 02/24/2017   Procedure: Left Heart Cath and Cors/Grafts Angiography;  Surgeon: Wellington Hampshire, MD;  Location: [place] CV LAB;  Service: Cardiovascular;  Laterality: N/A;  . LEFT HEART CATHETERIZATION WITH CORONARY/GRAFT ANGIOGRAM N/A 12/10/2011   Procedure: LEFT HEART CATHETERIZATION WITH Beatrix Fetters;  Surgeon: Troy Sine, MD;  Location: [facility] CATH LAB;  Service: Cardiovascular;  Laterality: N/A;  . LEFT HEART CATHETERIZATION WITH CORONARY/GRAFT ANGIOGRAM N/A 01/26/2014   Procedure: LEFT HEART CATHETERIZATION WITH Beatrix Fetters;  Surgeon: Troy Sine, MD;  Location: [facility]  CATH LAB;  Service: Cardiovascular;  Laterality: N/A;  . PPM GENERATOR CHANGEOUT N/A 12/15/2017   Procedure: PPM GENERATOR CHANGEOUT;  Surgeon: Evans Lance, MD;  Location: [place] CV LAB;  Service: Cardiovascular;  Laterality: N/A;  . PROSTATECTOMY      Social History:  Social History   Socioeconomic History  . Marital status: Married    Spouse name: Not on file  . Number of children: 5  . Years of education: Not on file  . Highest education level: Not on file  Occupational History  . Occupation: RETIRED    Employer: RETIRED    Comment: [employer]  . Financial resource strain: Not on file  . Food insecurity:    Worry: Not on file    Inability: Not on file  . Transportation needs:    Medical: Not  on file    Non-medical: Not on file  Tobacco Use  . Smoking status: Former Smoker    Packs/day: 2.00    Years: 65.00    Pack years: 130.00    Types: Cigarettes  . Smokeless tobacco: Former Systems developer    Quit date: 10/29/2008  Substance and Sexual Activity  . Alcohol use: No  . Drug use: No  . Sexual activity: Not on file  Lifestyle  . Physical activity:    Days per week: Not on file    Minutes per session: Not on file  . Stress: Not on file  Relationships  . Social connections:    Talks on phone: Not on file    Gets together: Not on file    Attends religious service: Not on file    Active member of club or organization: Not on file    Attends meetings of clubs or organizations: Not on file    Relationship status: Not on file  . Intimate partner violence:    Fear of current or ex partner: Not on file    Emotionally abused: Not on file    Physically abused: Not on file    Forced sexual activity: Not on file  Other Topics Concern  . Not on file  Social History Narrative  . Not on file    Family History:  Family History  Problem Relation Age of Onset  . Diabetes Father   . Stroke Sister   . Heart disease Sister   . Heart disease Brother   . Heart attack Brother   . Stroke Brother   . Hypertension Neg Hx     Medications:   Current Outpatient Medications on File Prior to Visit  Medication Sig Dispense Refill  . albuterol (PROVENTIL HFA;VENTOLIN HFA) 108 (90 Base) MCG/ACT inhaler Inhale 2 puffs into the lungs every 4 (four) hours as needed for wheezing or shortness of breath (or coughing). (Patient taking differently: Inhale 1 puff into the lungs every 4 (four) hours as needed for wheezing or shortness of breath (or coughing). ) 1 Inhaler 0  . aspirin 81 MG tablet Take 81 mg by mouth daily.     Marland Kitchen atorvastatin (LIPITOR) 40 MG tablet Take 1 tablet (40 mg total) by mouth daily. 30 tablet 6  . cholecalciferol (VITAMIN D) 1000 units tablet Take 1,000 Units by mouth daily.    .  clopidogrel (PLAVIX) 75 MG tablet Take 75 mg by mouth daily at 3 pm.     . docusate sodium (COLACE) 100 MG capsule Take 1 capsule (100 mg total) by mouth daily as needed for mild constipation. 10 capsule 0  .  ezetimibe (ZETIA) 10 MG tablet Take 10 mg by mouth daily.    . ferrous sulfate 325 (65 FE) MG tablet Take 325 mg by mouth 2 (two) times daily with a meal.     . furosemide (LASIX) 20 MG tablet Take 1 tablet (20 mg total) by mouth daily. 30 tablet 0  . guaiFENesin (ROBITUSSIN) 100 MG/5ML SOLN Take 5 mLs by mouth every 4 (four) hours as needed for cough or to loosen phlegm.    Marland Kitchen guaiFENesin-dextromethorphan (ROBITUSSIN DM) 100-10 MG/5ML syrup Take 10 mLs by mouth every 6 (six) hours. 118 mL 0  . ipratropium-albuterol (DUONEB) 0.5-2.5 (3) MG/3ML SOLN Take 3 mLs by nebulization 2 (two) times daily. 360 mL 1  . isosorbide mononitrate (IMDUR) 60 MG 24 hr tablet Take 90mg  (1.5 tablets) in the AM and 30mg  (1/2 tablet) in the PM (Patient taking differently: Take 30-90 mg by mouth See admin instructions. Take 90mg  (1.5 tablets) in the AM and 30mg  (1/2 tablet) in the PM) 180 tablet 3  . latanoprost (XALATAN) 0.005 % ophthalmic solution Place 1 drop into both eyes 2 (two) times a week.   4  . levothyroxine (SYNTHROID, LEVOTHROID) 125 MCG tablet TAKE 1 TABLET BY MOUTH EVERY DAY BEFORE BREAKFAST (Patient taking differently: TAKE 125 MCG BY MOUTH EVERY DAY BEFORE BREAKFAST) 30 tablet 11  . Menthol, Topical Analgesic, (ICY HOT EX) Apply 1 application topically daily as needed (aches and pains).    . metoprolol tartrate (LOPRESSOR) 50 MG tablet Take 1 tablet (50 mg total) by mouth 2 (two) times daily. 60 tablet 0  . nitroGLYCERIN (NITROSTAT) 0.4 MG SL tablet PLACE 1 TABLET UNDER THE TONGUE AS NEEDED. (Patient taking differently: PLACE 0.4 MG UNDER THE TONGUE AS NEEDED FOR CHEST PAIN.) 25 tablet 3  . OVER THE COUNTER MEDICATION Take 1 capsule by mouth daily as needed ("Prune-Lax" for constipation).    .  pantoprazole (PROTONIX) 40 MG tablet Take 40 mg by mouth 2 (two) times daily.    . polyethylene glycol (MIRALAX / GLYCOLAX) packet Take 17 g by mouth daily as needed for moderate constipation. 14 each 0  . ranolazine (RANEXA) 1000 MG SR tablet Take 1 tablet (1,000 mg total) by mouth 2 (two) times daily. 180 tablet 1  . tamsulosin (FLOMAX) 0.4 MG CAPS capsule Take 0.4 mg by mouth daily.  2   No current facility-administered medications on file prior to visit.     Allergies:  No Known Allergies   Physical Exam  Vitals:   04/05/18 1248  BP: 128/69  Pulse: 60  Weight: 164 lb (74.4 kg)  Height: 5\' 9"  (1.753 m)   Body mass index is 24.22 kg/m. No exam data present  General: well developed, pleasant elderly African-American male, well nourished, seated, in no evident distress Head: head normocephalic and atraumatic.   Neck: supple with no carotid or supraclavicular bruits Cardiovascular: regular rate and rhythm, no murmurs, +2 pitting edema LUE, +3 pitting edema LLE, +2 pitting edema RLE Musculoskeletal: no deformity Skin:  no rash/petichiae Vascular:  Normal pulses all extremities  Neurologic Exam Mental Status: Awake and fully alert.  Mild dysarthria. oriented to place and time. Recent and remote memory intact. Attention span, concentration and fund of knowledge appropriate. Mood and affect appropriate.  Cranial Nerves: Fundoscopic exam reveals sharp disc margins. Pupils equal, briskly reactive to light. Extraocular movements full without nystagmus.  Unable to test visual fields due to uncooperation.  Hearing intact. Facial sensation intact. Face, tongue, palate moves normally and  symmetrically.  Motor: Normal bulk and tone. Normal strength in all tested extremity muscles. Sensory.: intact to touch , pinprick , position and vibratory sensation.  Coordination: Rapid alternating movements normal in all extremities. Finger-to-nose and heel-to-shin performed accurately bilaterally. Gait  and Station: Arises from chair without difficulty. Stance is normal. Gait demonstrates normal stride length and balance . Able to heel, toe and tandem walk without difficulty.  Reflexes: 1+ and symmetric. Toes downgoing.    NIHSS  1 Modified Rankin  3 HAS-BLED 5 CHA2DS2-VASc 7   Diagnostic Data (Labs, Imaging, Testing)  CT HEAD WO CONTRAST 02/22/18 IMPRESSION: 1. No acute intracranial pathology seen on CT. 2. Moderate cortical volume loss and scattered small vessel ischemic Microangiopathy.  ECHOCARDIOGRAM 01/24/18 Study Conclusions - Left ventricle: The cavity size was normal. Wall thickness was   normal. Systolic function was mildly to moderately reduced. The   estimated ejection fraction was in the range of 40% to 45%.   Features are consistent with a pseudonormal left ventricular   filling pattern, with concomitant abnormal relaxation and   increased filling pressure (grade 2 diastolic dysfunction). - Ventricular septum: Septal motion showed abnormal function and   dyssynergy. - Right atrium: The atrium was moderately dilated. - Pulmonary arteries: Systolic pressure was mildly increased. PA   peak pressure: 36 mm Hg (S).  VAS US CAROTID DUPLEX BILATERAL 02/22/18 inal Interpretation: Right Carotid: Velocities in the right ICA are consistent with a 80-99% stenosis. Technically difficult due to above mentioned difficulties. Left Carotid: Velocities in the left ICA are consistent with a 40-59% stenosis. Technically difficult due to above mentioned difficulties. Vertebrals: Bilateral vertebral arteries demonstrate antegrade flow. Subclavians: Normal flow hemodynamics were seen in bilateral subclavian arteries.  VAS [place] TRANSCRANIAL DOPPLER 02/22/18 Final Interpretation: Elevated mean flow velocities in in the left middle and anterior cerebral artery suggests mild stenosis. Unable to insonated posterior circulation vessels due to poor occipital windows bilaterally. Normal mean  flow velocities and remaining identified vessels of anterior cerebral circulation.     ASSESSMENT: John Peck is a 84 y.o. year old male here with TIA on 02/22/18 secondary to atrial fibrillation not on AC. Vascular risk factors include HTN, HLD, CHF, and a. fib.     PLAN: -Continue aspirin 81 mg daily and clopidogrel 75 mg daily  and lipitor  for secondary stroke prevention -F/u with PCP regarding your HLD and HTN management -f/u with cardiologist regarding atrial fibrillation and CAD management -continue to monitor BP at home -Continue PT/OT/ST -Maintain strict control of hypertension with blood pressure goal below 130/90, diabetes with hemoglobin A1c goal below 6.5% and cholesterol with LDL cholesterol (bad cholesterol) goal below 70 mg/dL. I also advised the patient to eat a healthy diet with plenty of whole grains, cereals, fruits and vegetables, exercise regularly and maintain ideal body weight.  Follow up in 3 months or call earlier if needed   Greater than 50% of time during this 25 minute visit was spent on counseling,explanation of diagnosis of TIA, reviewing risk factor management of HLD, HTN, CAD and A. fib, planning of further management, discussion with patient and family and coordination of care    Venancio Poisson, [facility]  [facility] [address] [place], [place] [address]  Phone [phone] Fax [phone]

## 2018-04-05 NOTE — Patient Instructions (Signed)
Continue aspirin 81 mg daily and clopidogrel 75 mg daily  and lipitor and zetia  for secondary stroke prevention and CAD  Continue to follow up with PCP regarding chosterol and blood pressure management   Continue to follow up with cardiologist regarding atrial fibrillation management  Continue to monitor blood pressure at home  Continue therapies   Maintain strict control of hypertension with blood pressure goal below 130/90, diabetes with hemoglobin A1c goal below 6.5% and cholesterol with LDL cholesterol (bad cholesterol) goal below 70 mg/dL. I also advised the patient to eat a healthy diet with plenty of whole grains, cereals, fruits and vegetables, exercise regularly and maintain ideal body weight.  Followup in the future with me in 3 months or call earlier if needed         Thank you for coming to see [place] at [facility]. I hope we have been able to provide you high quality care today.  You may receive a patient satisfaction survey over the next few weeks. We would appreciate your feedback and comments so that we may continue to improve ourselves and the health of our patients.

## 2018-04-05 NOTE — Telephone Encounter (Signed)
Rx sent to pharmacy   

## 2018-04-07 ENCOUNTER — Ambulatory Visit (INDEPENDENT_AMBULATORY_CARE_PROVIDER_SITE_OTHER)
Admission: RE | Admit: 2018-04-07 | Discharge: 2018-04-07 | Disposition: A | Discharge status: Home / Self Care | Payer: Medicare HMO | Source: Ambulatory Visit | Attending: Internal Medicine | Admitting: Internal Medicine

## 2018-04-07 ENCOUNTER — Encounter: Payer: Self-pay | Admitting: Internal Medicine

## 2018-04-07 ENCOUNTER — Ambulatory Visit (INDEPENDENT_AMBULATORY_CARE_PROVIDER_SITE_OTHER): Payer: Medicare HMO | Admitting: Internal Medicine

## 2018-04-07 VITALS — BP 118/72 | HR 63 | Ht 69.0 in | Wt 167.0 lb

## 2018-04-07 DIAGNOSIS — J189 Pneumonia, unspecified organism: Secondary | ICD-10-CM

## 2018-04-07 DIAGNOSIS — J449 Chronic obstructive pulmonary disease, unspecified: Secondary | ICD-10-CM | POA: Diagnosis not present

## 2018-04-07 DIAGNOSIS — J90 Pleural effusion, not elsewhere classified: Secondary | ICD-10-CM | POA: Diagnosis not present

## 2018-04-07 NOTE — Patient Instructions (Addendum)
Ok to use albuterol up to every 4 hours or not all depending on whether cough/ wheeze/ short of breath  Continue protonix Take 30- 60 min before your first and last meals of the day   GERD (REFLUX)  is an extremely common cause of respiratory symptoms just like yours , many times with no obvious heartburn at all.    It can be treated with medication, but also with lifestyle changes including elevation of the head of your bed (ideally with 6 inch  bed blocks),  Smoking cessation, avoidance of late meals, excessive alcohol, and avoid fatty foods, chocolate, peppermint, colas, red wine, and acidic juices such as orange juice.  NO MINT OR MENTHOL PRODUCTS SO NO COUGH DROPS  USE SUGARLESS CANDY INSTEAD (Jolley ranchers or Stover's or Life Savers) or even ice chips will also do - the key is to swallow to prevent all throat clearing. NO OIL BASED VITAMINS - use powdered substitutes.    Please remember to go to the  x-ray department downstairs in the basement  for your tests - we will call you with the results when they are available.      Pulmonary follow up is as needed  Add: cxr in 6 weeks - sooner if breathng worse

## 2018-04-07 NOTE — Progress Notes (Signed)
Subjective:     Patient ID: John Peck, male   DOB: May 25, 1924,   MRN: [id number]  HPI  47 yobm quit smoking 1999 still living at home using a walker to ambulate then cva > hosp > Pna > MI > [facility]   Prev Dg 04/17/15 only finding small HH  ST eval 03/15/18 : Subjective: pt describes dysphagia including globus sensation, coughing with intake that has been ongoing for the past year or so rec Pt had adequate airway protection with only intermittent, trace penetration with thin liquids that cleared spontaneously upon completion of the swallow. Recommend that he initiate regular diet textures and thin liquids; meds whole in puree. SLP will f/u briefly for tolerance given concern for PNA.   Last admit: Admit date: 03/14/2018 Discharge date: 03/19/2018  Admitted From: Home Disposition:  SNF Discharge Condition:Stable CODE STATUS:FULL Diet recommendation: Heart Healthy  Brief/Interim Summary: Patient is a 84 year old male with past medical history of atrial fibrillation not on anticoagulant due to GI bleed, hypertension, hyperlipidemia, CKD stage III, coronary artery disease status post CABG, right carotid artery stenosis, pacemaker placement, combined systolic and diastolic CHF who presented to the hospital with complaints of shortness of breath and cough. Patient was just admitted 2 weeks ago and was diagnosed with possible stroke related to right-sided ICA stenosis. On this presentation, chest x-ray was done which showed concern for multifocal pneumonia. Patient was treated with IV antibiotics.  His respiratory status gradually improved with IV antibiotics.  Cultures remain negative.  Patient was also treated with IV steroids for COPD exacerbation which was changed to oral later. Patient also developed chest pain during this hospitalization. Troponin found to be elevated. Cardiology consulted.  Chest pain is spontaneously resolved.  Patient denied any interventions by  cardiology. Today he is overall status is stable.  He looks comfortable and saturating fine on room air.  Denies any chest pain. Patient is stable for discharge to skilled nursing facility today.  Following problems were addressed during his hospitalization:  Acute respiratory failure with hypoxia/Health care associated Pneumonia: Suspect healthcare respiratory pneumonia versus aspiration pneumonia. Respiratory status has improved. Patient is not on oxygen at home. He is off oxygen now.Started on broad-spectrum antibiotics on admission which have been changed to oral today.Cultures negative so far. Patient is afebrile. Speech was following for possible aspiration pneumonia. Recommended regular solids and thin liquid. He was also started on steroids for wheezing. He has history of COPD. Last CXR showedStable infiltrates are noted in the left base and right upper lobe.  Chest Pain: He has extensive history of coronary artery disease. Status post CABG. Complained of chest pain but currentlyoff chest pain. Troponin elevated but trended down.Cardiology was following.  He denies any further intervention so we decided to continue medical treatment.  Combined systolic/diastolic heart failure:Echocardiogram orderedand showedejection fraction of 45 to 50%, hypokinesis of the inferior myocardium, moderate left ventricular hypertrophy,moderately increased pulmonary artery pressure .Currently euvolemic.  We will put him on Lasix 20 mg daily on discharge. He should follow-up with his cardiologist on discharge.  COPD: Still has mild expiratory wheezes.  We will discharge him on tapering dose of prednisone.  Follow-up with pulmonology as an outpatient.  History of coronary artery disease: Status post CABG. Continue aspirin, Plavix, Ranexa, Zetia,imdur. Not on ACE or ARB due to CKD.  Paroxysmal A. KXF:GHWE on A. fib with RVR during this hospitalization.Started on metoprolol by  cardiology.He also  has a pacemaker. Not on anti-Coagulation due to history of previous  GI bleed.  Currently rate is controlled.  Hypothyroidism: Continue Synthyroid.  History of BPH: Continue Flomax.  History of CVA: Was recently admitted here. Has chronic right-sided ICA stenosis. Patient was evaluated by PT/OT and was recommended skilled nursing facility . Social worker was following.  History of CKD :H/O CKD stage 3. Baseline creatinine ranges from 1.3-1.4. Was on acute kidney injury on presentation and was started on gentle IV fluids.Will stop IV fluids.  Currently his kidney function is at baseline.  Will resume Lasix on discharge.  Check BMP in a week.     Discharge Diagnoses:  Principal Problem:   HCAP (healthcare-associated pneumonia) Active Problems:   S/P CABG x 2 '89. RCA stent'04. last cath Jan 2013   Chest pain   Pacemaker   Chronic combined systolic and diastolic CHF (congestive heart failure) (HCC)   COPD (chronic obstructive pulmonary disease) ([place])   H/O: GI bleed   CKD (chronic kidney disease), stage III (HCC)   Anemia   Paroxysmal atrial fibrillation (HCC)   Hypothyroidism   Carotid stenosis, right   Bilateral conjunctivitis   Atrial fibrillation with RVR (HCC)   Acute respiratory distress   Non-ST elevation (NSTEMI) myocardial infarction [name])       04/07/2018 1st [facility] office visit/ Lyzette Reinhardt   Chief Complaint  Patient presents with  . Pulmonary Consult    Referred by Dr. Virgel Bouquet. Pt c/o SOB for at least the past 6 months. He states SOB comes and goes- occ when he lies down and sometimes when he walks from one room to the next.   feeling the best he has since d/c in terms of copd /sob  and only using the neb bid seems to help some/ swallowing is about the same since last ST eval and no obvious choking on food   No obvious day to day or daytime variability or assoc excess/ purulent sputum or mucus plugs or hemoptysis or cp or  chest tightness, subjective wheeze or overt sinus or hb symptoms. No unusual exposure hx or h/o childhood pna/ asthma or knowledge of premature birth.  Sleeping  Ok flat   without nocturnal  or early am exacerbation  of respiratory  c/o's or need for noct saba. Also denies any obvious fluctuation of symptoms with weather or environmental changes or other aggravating or alleviating factors except as outlined above   Current Allergies, Complete Past Medical History, Past Surgical History, Family History, and Social History were reviewed in [employer] record.  ROS  The following are not active complaints unless bolded Hoarseness, sore throat, dysphagia liquids only dental problems, itching, sneezing,  nasal congestion or discharge of excess mucus or purulent secretions, ear ache,   fever, chills, sweats, unintended wt loss or wt gain, classically pleuritic or exertional cp,  orthopnea pnd or arm/hand swelling  or leg swelling, presyncope, palpitations, abdominal pain, anorexia, nausea, vomiting, diarrhea  or change in bowel habits or change in bladder habits, change in stools or change in urine, dysuria, hematuria,  rash, arthralgias, visual complaints, headache, numbness, weakness or ataxia or problems with walking or coordination,  change in mood or  memory.        Current Meds  Medication Sig  . albuterol (PROVENTIL HFA;VENTOLIN HFA) 108 (90 Base) MCG/ACT inhaler Inhale 2 puffs into the lungs every 4 (four) hours as needed for wheezing or shortness of breath (or coughing). (Patient taking differently: Inhale 1 puff into the lungs every 4 (four) hours as needed for  wheezing or shortness of breath (or coughing). )  . aspirin 81 MG tablet Take 81 mg by mouth daily.   Marland Kitchen atorvastatin (LIPITOR) 40 MG tablet Take 1 tablet (40 mg total) by mouth daily.  . cholecalciferol (VITAMIN D) 1000 units tablet Take 1,000 Units by mouth daily.  . clopidogrel (PLAVIX) 75 MG tablet Take 75 mg by  mouth daily at 3 pm.   . docusate sodium (COLACE) 100 MG capsule Take 1 capsule (100 mg total) by mouth daily as needed for mild constipation.  Marland Kitchen ezetimibe (ZETIA) 10 MG tablet Take 10 mg by mouth daily.  . ferrous sulfate 325 (65 FE) MG tablet Take 325 mg by mouth 2 (two) times daily with a meal.   . furosemide (LASIX) 20 MG tablet Take 1 tablet (20 mg total) by mouth daily.  Marland Kitchen guaiFENesin (ROBITUSSIN) 100 MG/5ML SOLN Take 5 mLs by mouth every 4 (four) hours as needed for cough or to loosen phlegm.  Marland Kitchen guaiFENesin-dextromethorphan (ROBITUSSIN DM) 100-10 MG/5ML syrup Take 10 mLs by mouth every 6 (six) hours.  Marland Kitchen ipratropium-albuterol (DUONEB) 0.5-2.5 (3) MG/3ML SOLN Take 3 mLs by nebulization 2 (two) times daily.  . isosorbide mononitrate (IMDUR) 60 MG 24 hr tablet Take 90mg  (1.5 tablets) in the AM and 30mg  (1/2 tablet) in the PM (Patient taking differently: Take 30-90 mg by mouth See admin instructions. Take 90mg  (1.5 tablets) in the AM and 30mg  (1/2 tablet) in the PM)  . latanoprost (XALATAN) 0.005 % ophthalmic solution Place 1 drop into both eyes 2 (two) times a week.   . levothyroxine (SYNTHROID, LEVOTHROID) 125 MCG tablet TAKE 1 TABLET BY MOUTH EVERY DAY BEFORE BREAKFAST  . Menthol, Topical Analgesic, (ICY HOT EX) Apply 1 application topically daily as needed (aches and pains).  . metoprolol tartrate (LOPRESSOR) 50 MG tablet Take 1 tablet (50 mg total) by mouth 2 (two) times daily.  . nitroGLYCERIN (NITROSTAT) 0.4 MG SL tablet PLACE 1 TABLET UNDER THE TONGUE AS NEEDED. (Patient taking differently: PLACE 0.4 MG UNDER THE TONGUE AS NEEDED FOR CHEST PAIN.)  . OVER THE COUNTER MEDICATION Take 1 capsule by mouth daily as needed ("Prune-Lax" for constipation).  . pantoprazole (PROTONIX) 40 MG tablet Take 40 mg by mouth 2 (two) times daily.  . polyethylene glycol (MIRALAX / GLYCOLAX) packet Take 17 g by mouth daily as needed for moderate constipation.  . ranolazine (RANEXA) 1000 MG SR tablet Take 1  tablet (1,000 mg total) by mouth 2 (two) times daily.  . tamsulosin (FLOMAX) 0.4 MG CAPS capsule Take 0.4 mg by mouth daily.         Review of Systems     Objective:   Physical Exam W/ c bound stoic elderly bm prefers to let his fm do the talking    Wt Readings from Last 3 Encounters:  04/07/18 167 lb (75.8 kg)  04/05/18 164 lb (74.4 kg)  03/19/18 185 lb 3 oz (84 kg)     Vital signs reviewed - Note on arrival 02 sats  97% on RA      HEENT: nl dentition, turbinates bilaterally, and oropharynx. Nl external ear canals without cough reflex   NECK :  without JVD/Nodes/TM/ nl carotid upstrokes bilaterally   LUNGS: no acc muscle use,   Distant bs bilaterally with a few crackles L > R base, minimal dullness at left base without cough on insp or exp maneuvers   CV:  RRR  no s3 or murmur or increase in P2, and trace bilateral  lower ext edema  ABD:  soft and nontender with nl inspiratory excursion in the supine position. No bruits or organomegaly appreciated, bowel sounds nl  MS:  Nl gait/ ext warm without deformities, calf tenderness, cyanosis or clubbing No obvious joint restrictions   SKIN: warm and dry without lesions    NEURO:  alert, approp, nl sensorium with  no motor or cerebellar deficits apparent.    CXR PA and Lateral:   04/07/2018 :    I personally reviewed images and   impression as follows:  Copd/ L base atx/small effusion      Assessment:

## 2018-04-08 ENCOUNTER — Encounter: Payer: Self-pay | Admitting: Internal Medicine

## 2018-04-08 ENCOUNTER — Telehealth: Payer: Self-pay | Admitting: Internal Medicine

## 2018-04-08 ENCOUNTER — Institutional Professional Consult (permissible substitution): Payer: Medicare HMO | Admitting: Internal Medicine

## 2018-04-08 NOTE — Assessment & Plan Note (Signed)
S/p admit 03/14/18 likely asp mech see d/c summary  - small residual late parapneumonic effusion/basilar atx noted 04/07/2018 > rec f/u cxr in 6 weeks (unless directed earlier by symptoms)   Advised fm this is typical in setting of intermittent aspiration and only issue is ongoing input by ST if any further issues with swallowing related to obvious cognitive decline s/p cva but no role for further intervention or abx at this point and explained the latter in detail in the aspiration setting where much of the injury is not related to bacterial infection at all.  Total time devoted to counseling  > 50 % of initial 60 min office visit:  review case with pt/wife/daughter including extensive inpt records/images/  discussion of options/alternatives/ personally creating written customized instructions  in presence of pt  then going over those specific  Instructions directly with the pt including how to use all of the meds but in particular covering each new medication in detail and the difference between the maintenance= "automatic" meds and the prns using an action plan format for the latter (If this problem/symptom => do that organization reading Left to right).  Please see AVS from this visit for a full list of these instructions which I personally wrote for this pt and  are unique to this visit.

## 2018-04-08 NOTE — Telephone Encounter (Signed)
Spoke with Dorian Pod and notified of results of cxr  She verbalized understanding  Nothing further needed

## 2018-04-08 NOTE — Assessment & Plan Note (Signed)
Quit smoking 1999   When respiratory symptoms begin or become refractory well after a patient reports complete smoking cessation,  Especially when this wasn't the case while they were smoking, a red flag is raised based on the work of Dr Kris Mouton which states:   if you quit smoking when your best day FEV1 is still well preserved it is highly unlikely you will progress to severe disease.  That is to say, once the smoking stops,  the symptoms should not suddenly erupt or markedly worsen.  If so, the differential diagnosis should include  obesity/deconditioning,  LPR/Reflux/Aspiration syndromes,  occult CHF, or  especially side effect of medications commonly used in this population.(none apparent)   Ok for now to just rx with prn saba but if needing more than bid would transition to lama/laba likely neb form as doubt will be able to use other devices effectively.

## 2018-04-08 NOTE — Progress Notes (Signed)
Spoke with pt's daughter Dorian Pod and notified of results per Dr. Melvyn Novas. She verbalized understanding and denied any questions.

## 2018-04-12 DIAGNOSIS — N189 Chronic kidney disease, unspecified: Secondary | ICD-10-CM | POA: Diagnosis not present

## 2018-04-12 DIAGNOSIS — M25579 Pain in unspecified ankle and joints of unspecified foot: Secondary | ICD-10-CM | POA: Diagnosis not present

## 2018-04-12 DIAGNOSIS — I4891 Unspecified atrial fibrillation: Secondary | ICD-10-CM | POA: Diagnosis not present

## 2018-04-12 DIAGNOSIS — J449 Chronic obstructive pulmonary disease, unspecified: Secondary | ICD-10-CM | POA: Diagnosis not present

## 2018-04-12 NOTE — Progress Notes (Signed)
I agree with the above plan 

## 2018-04-13 DIAGNOSIS — I130 Hypertensive heart and chronic kidney disease with heart failure and stage 1 through stage 4 chronic kidney disease, or unspecified chronic kidney disease: Secondary | ICD-10-CM | POA: Diagnosis not present

## 2018-04-13 DIAGNOSIS — N401 Enlarged prostate with lower urinary tract symptoms: Secondary | ICD-10-CM | POA: Diagnosis not present

## 2018-04-13 DIAGNOSIS — I480 Paroxysmal atrial fibrillation: Secondary | ICD-10-CM | POA: Diagnosis not present

## 2018-04-13 DIAGNOSIS — D631 Anemia in chronic kidney disease: Secondary | ICD-10-CM | POA: Diagnosis not present

## 2018-04-13 DIAGNOSIS — Z7902 Long term (current) use of antithrombotics/antiplatelets: Secondary | ICD-10-CM | POA: Diagnosis not present

## 2018-04-13 DIAGNOSIS — N183 Chronic kidney disease, stage 3 (moderate): Secondary | ICD-10-CM | POA: Diagnosis not present

## 2018-04-13 DIAGNOSIS — J189 Pneumonia, unspecified organism: Secondary | ICD-10-CM | POA: Diagnosis not present

## 2018-04-13 DIAGNOSIS — I5040 Unspecified combined systolic (congestive) and diastolic (congestive) heart failure: Secondary | ICD-10-CM | POA: Diagnosis not present

## 2018-04-13 DIAGNOSIS — I2510 Atherosclerotic heart disease of native coronary artery without angina pectoris: Secondary | ICD-10-CM | POA: Diagnosis not present

## 2018-04-13 DIAGNOSIS — J440 Chronic obstructive pulmonary disease with acute lower respiratory infection: Secondary | ICD-10-CM | POA: Diagnosis not present

## 2018-04-15 ENCOUNTER — Ambulatory Visit
Admission: RE | Admit: 2018-04-15 | Discharge: 2018-04-15 | Disposition: A | Discharge status: Home / Self Care | Payer: Medicare HMO | Source: Ambulatory Visit | Attending: Internal Medicine | Admitting: Internal Medicine

## 2018-04-15 ENCOUNTER — Other Ambulatory Visit: Payer: Self-pay | Admitting: Internal Medicine

## 2018-04-15 DIAGNOSIS — N184 Chronic kidney disease, stage 4 (severe): Secondary | ICD-10-CM | POA: Diagnosis not present

## 2018-04-15 DIAGNOSIS — J189 Pneumonia, unspecified organism: Secondary | ICD-10-CM

## 2018-04-15 DIAGNOSIS — R05 Cough: Secondary | ICD-10-CM | POA: Diagnosis not present

## 2018-04-15 DIAGNOSIS — J449 Chronic obstructive pulmonary disease, unspecified: Secondary | ICD-10-CM | POA: Diagnosis not present

## 2018-04-15 DIAGNOSIS — R8290 Unspecified abnormal findings in urine: Secondary | ICD-10-CM | POA: Diagnosis not present

## 2018-04-15 DIAGNOSIS — I25810 Atherosclerosis of coronary artery bypass graft(s) without angina pectoris: Secondary | ICD-10-CM | POA: Diagnosis not present

## 2018-04-15 DIAGNOSIS — I779 Disorder of arteries and arterioles, unspecified: Secondary | ICD-10-CM | POA: Diagnosis not present

## 2018-04-15 DIAGNOSIS — I639 Cerebral infarction, unspecified: Secondary | ICD-10-CM | POA: Diagnosis not present

## 2018-04-15 DIAGNOSIS — I509 Heart failure, unspecified: Secondary | ICD-10-CM | POA: Diagnosis not present

## 2018-04-15 DIAGNOSIS — I214 Non-ST elevation (NSTEMI) myocardial infarction: Secondary | ICD-10-CM | POA: Diagnosis not present

## 2018-04-15 DIAGNOSIS — E7800 Pure hypercholesterolemia, unspecified: Secondary | ICD-10-CM | POA: Diagnosis not present

## 2018-04-19 ENCOUNTER — Ambulatory Visit: Payer: Medicare HMO | Admitting: Physician Assistant

## 2018-04-24 ENCOUNTER — Other Ambulatory Visit: Payer: Self-pay | Admitting: Cardiovascular Disease

## 2018-05-02 ENCOUNTER — Encounter: Payer: Self-pay | Admitting: Internal Medicine

## 2018-05-02 ENCOUNTER — Ambulatory Visit (INDEPENDENT_AMBULATORY_CARE_PROVIDER_SITE_OTHER): Payer: Medicare HMO | Admitting: Internal Medicine

## 2018-05-02 VITALS — BP 140/58 | HR 60 | Ht 69.0 in | Wt 165.4 lb

## 2018-05-02 DIAGNOSIS — Z950 Presence of cardiac pacemaker: Secondary | ICD-10-CM | POA: Diagnosis not present

## 2018-05-02 DIAGNOSIS — I10 Essential (primary) hypertension: Secondary | ICD-10-CM

## 2018-05-02 DIAGNOSIS — I495 Sick sinus syndrome: Secondary | ICD-10-CM | POA: Diagnosis not present

## 2018-05-02 LAB — CUP PACEART INCLINIC DEVICE CHECK
Battery Remaining Longevity: 111 mo
Brady Statistic RA Percent Paced: 54 %
Brady Statistic RV Percent Paced: 85 %
Date Time Interrogation Session: 20190624162519
Implantable Lead Implant Date: 20090908
Implantable Lead Location: 753860
Lead Channel Pacing Threshold Amplitude: 0.5 V
Lead Channel Pacing Threshold Amplitude: 0.5 V
Lead Channel Pacing Threshold Pulse Width: 0.4 ms
Lead Channel Pacing Threshold Pulse Width: 0.4 ms
Lead Channel Sensing Intrinsic Amplitude: 2.9 mV
Lead Channel Sensing Intrinsic Amplitude: 4.5 mV
Lead Channel Setting Pacing Amplitude: 1.5 V
MDC IDC LEAD IMPLANT DT: 20090908
MDC IDC LEAD LOCATION: 753859
MDC IDC MSMT BATTERY VOLTAGE: 3.01 V
MDC IDC MSMT LEADCHNL RA IMPEDANCE VALUE: 350 Ohm
MDC IDC MSMT LEADCHNL RV IMPEDANCE VALUE: 375 Ohm
MDC IDC MSMT LEADCHNL RV PACING THRESHOLD AMPLITUDE: 1 V
MDC IDC MSMT LEADCHNL RV PACING THRESHOLD PULSEWIDTH: 0.4 ms
MDC IDC PG IMPLANT DT: 20190206
MDC IDC PG SERIAL: 8995118
MDC IDC SET LEADCHNL RV PACING AMPLITUDE: 1.25 V
MDC IDC SET LEADCHNL RV PACING PULSEWIDTH: 0.4 ms
MDC IDC SET LEADCHNL RV SENSING SENSITIVITY: 1.5 mV
Pulse Gen Model: 2272

## 2018-05-02 NOTE — Patient Instructions (Signed)

## 2018-05-02 NOTE — Progress Notes (Signed)
HPI [name] returns today for PPM followup. He has a h/o PAF, symptomatic bradycardia, s/p PPM insertion, with a h/o GI bleeding resulting in stopping his systemic anti-coagulation. The patient has had a stroke. He c/o some difficulty sleeping at night. He sleeps during the day. Denies chest pain or sob. He has chronic peripheral edema. No Known Allergies   Current Outpatient Medications  Medication Sig Dispense Refill  . albuterol (PROVENTIL HFA;VENTOLIN HFA) 108 (90 Base) MCG/ACT inhaler Inhale 2 puffs into the lungs every 4 (four) hours as needed for wheezing or shortness of breath (or coughing). (Patient taking differently: Inhale 1 puff into the lungs every 4 (four) hours as needed for wheezing or shortness of breath (or coughing). ) 1 Inhaler 0  . aspirin 81 MG tablet Take 81 mg by mouth daily.     Marland Kitchen atorvastatin (LIPITOR) 40 MG tablet Take 1 tablet (40 mg total) by mouth daily. 30 tablet 6  . cholecalciferol (VITAMIN D) 1000 units tablet Take 1,000 Units by mouth daily.    . clopidogrel (PLAVIX) 75 MG tablet Take 75 mg by mouth daily at 3 pm.     . docusate sodium (COLACE) 100 MG capsule Take 1 capsule (100 mg total) by mouth daily as needed for mild constipation. 10 capsule 0  . ezetimibe (ZETIA) 10 MG tablet Take 10 mg by mouth daily.    Marland Kitchen ezetimibe (ZETIA) 10 MG tablet TAKE 1 TABLET BY MOUTH EVERY DAY 90 tablet 2  . ferrous sulfate 325 (65 FE) MG tablet Take 325 mg by mouth 2 (two) times daily with a meal.     . furosemide (LASIX) 20 MG tablet Take 1 tablet (20 mg total) by mouth daily. 30 tablet 0  . guaiFENesin (ROBITUSSIN) 100 MG/5ML SOLN Take 5 mLs by mouth every 4 (four) hours as needed for cough or to loosen phlegm.    Marland Kitchen guaiFENesin-dextromethorphan (ROBITUSSIN DM) 100-10 MG/5ML syrup Take 10 mLs by mouth every 6 (six) hours. 118 mL 0  . ipratropium-albuterol (DUONEB) 0.5-2.5 (3) MG/3ML SOLN Take 3 mLs by nebulization 2 (two) times daily. 360 mL 1  . isosorbide  mononitrate (IMDUR) 60 MG 24 hr tablet Take 90mg  (1.5 tablets) in the AM and 30mg  (1/2 tablet) in the PM (Patient taking differently: Take 30-90 mg by mouth See admin instructions. Take 90mg  (1.5 tablets) in the AM and 30mg  (1/2 tablet) in the PM) 180 tablet 3  . latanoprost (XALATAN) 0.005 % ophthalmic solution Place 1 drop into both eyes 2 (two) times a week.   4  . levothyroxine (SYNTHROID, LEVOTHROID) 125 MCG tablet TAKE 1 TABLET BY MOUTH EVERY DAY BEFORE BREAKFAST 30 tablet 5  . Menthol, Topical Analgesic, (ICY HOT EX) Apply 1 application topically daily as needed (aches and pains).    . metoprolol tartrate (LOPRESSOR) 50 MG tablet Take 1 tablet (50 mg total) by mouth 2 (two) times daily. 60 tablet 0  . nitroGLYCERIN (NITROSTAT) 0.4 MG SL tablet PLACE 1 TABLET UNDER THE TONGUE AS NEEDED. (Patient taking differently: PLACE 0.4 MG UNDER THE TONGUE AS NEEDED FOR CHEST PAIN.) 25 tablet 3  . OVER THE COUNTER MEDICATION Take 1 capsule by mouth daily as needed ("Prune-Lax" for constipation).    . pantoprazole (PROTONIX) 40 MG tablet Take 40 mg by mouth 2 (two) times daily.    . polyethylene glycol (MIRALAX / GLYCOLAX) packet Take 17 g by mouth daily as needed for moderate constipation. 14 each 0  . ranolazine (  RANEXA) 1000 MG SR tablet Take 1 tablet (1,000 mg total) by mouth 2 (two) times daily. 180 tablet 1  . tamsulosin (FLOMAX) 0.4 MG CAPS capsule Take 0.4 mg by mouth daily.  2   No current facility-administered medications for this visit.      Past Medical History:  Diagnosis Date  . Anemia   . Arthritis   . CAD (coronary artery disease)    a. CABG '89; b. PCI '04; c. 11/2015 Cath/PCI: LM 20ost, LAD 140m, LCX 30p, OM2 40, RCA 30p, 10p ISR, 90d (3.0x12 Synergy DES), AM 90, VG->OM2 known to be 100, LIMA->LAD not injected, patent in 2015.  . Carotid stenosis, right   . Chronic combined systolic and diastolic CHF (congestive heart failure) ([place])   . CKD (chronic kidney disease), stage III ([place])    . H/O: GI bleed    REMOTE HISTORY  . History of COPD   . Hyperlipidemia   . Hypertensive heart disease   . LBBB (left bundle branch block)   . Pacemaker Sept 2009   St Jude  . PAF (paroxysmal atrial fibrillation) (HCC)    a. not on anticoag due to prior GIB.  . Sick sinus syndrome [facility]) Sept 2009   ST Jude PTVDP  . Stroke ([place])     ROS:   All systems reviewed and negative except as noted in the HPI.   Past Surgical History:  Procedure Laterality Date  . CARDIAC CATHETERIZATION  11/2011   EF 50%; significant native CAD w/80% stenosis of the LAD after 2nd giagonal vessel and septal perforating artery w/total occlusion of the mid left anterior descendiung.; 60-70% ostiasl stenosis in the circumflex vessel followed by 40% proximal stenosis and 70-80% distal circumflex stenosis;   . CARDIAC CATHETERIZATION N/A 11/29/2015   Procedure: Left Heart Cath and Cors/Grafts Angiography;  Surgeon: Burnell Blanks, MD;  Location: [place] CV LAB;  Service: Cardiovascular;  Laterality: N/A;  . CARDIAC CATHETERIZATION N/A 11/29/2015   Procedure: Coronary Stent Intervention;  Surgeon: Burnell Blanks, MD;  Location: [place] CV LAB;  Service: Cardiovascular;  Laterality: N/A;  . CATARACT EXTRACTION  2004  . CORONARY ANGIOGRAPHY N/A 02/25/2017   Procedure: Coronary Angiography;  Surgeon: Belva Crome, MD;  Location: [place] CV LAB;  Service: Cardiovascular;  Laterality: N/A;  . CORONARY ANGIOPLASTY WITH STENT PLACEMENT  2004   RCA  . CORONARY ARTERY BYPASS GRAFT  1989   had LIMA to his LAD, a vein to the circumflex. In 2004 underwent stenting to his right coronary artery.  . CORONARY STENT INTERVENTION N/A 02/24/2017   Procedure: Coronary Stent Intervention;  Surgeon: Wellington Hampshire, MD;  Location: [place] CV LAB;  Service: Cardiovascular;  Laterality: N/A;  cfx  . HEMORRHOID SURGERY    . INSERT / REPLACE / REMOVE PACEMAKER  07/17/08   DUAL-CHAMBER; PPM-ST.JUDE MEDNET  .  LEFT HEART CATH AND CORS/GRAFTS ANGIOGRAPHY N/A 02/24/2017   Procedure: Left Heart Cath and Cors/Grafts Angiography;  Surgeon: Wellington Hampshire, MD;  Location: [place] CV LAB;  Service: Cardiovascular;  Laterality: N/A;  . LEFT HEART CATHETERIZATION WITH CORONARY/GRAFT ANGIOGRAM N/A 12/10/2011   Procedure: LEFT HEART CATHETERIZATION WITH Beatrix Fetters;  Surgeon: Troy Sine, MD;  Location: [facility] CATH LAB;  Service: Cardiovascular;  Laterality: N/A;  . LEFT HEART CATHETERIZATION WITH CORONARY/GRAFT ANGIOGRAM N/A 01/26/2014   Procedure: LEFT HEART CATHETERIZATION WITH Beatrix Fetters;  Surgeon: Troy Sine, MD;  Location[facility] CATH LAB;  Service: Cardiovascular;  Laterality:  N/A;  . PPM GENERATOR CHANGEOUT N/A 12/15/2017   Procedure: PPM GENERATOR CHANGEOUT;  Surgeon: Evans Lance, MD;  Location: [place] CV LAB;  Service: Cardiovascular;  Laterality: N/A;  . PROSTATECTOMY       Family History  Problem Relation Age of Onset  . Diabetes Father   . Stroke Sister   . Heart disease Sister   . Heart disease Brother   . Heart attack Brother   . Stroke Brother   . Hypertension Neg Hx      Social History   Socioeconomic History  . Marital status: Married    Spouse name: Not on file  . Number of children: 5  . Years of education: Not on file  . Highest education level: Not on file  Occupational History  . Occupation: RETIRED    Employer: RETIRED    Comment: [employer]  . Financial resource strain: Not on file  . Food insecurity:    Worry: Not on file    Inability: Not on file  . Transportation needs:    Medical: Not on file    Non-medical: Not on file  Tobacco Use  . Smoking status: Former Smoker    Packs/day: 0.75    Years: 65.00    Pack years: 48.75    Types: Cigarettes    Last attempt to quit: 11/09/1997    Years since quitting: 20.4  . Smokeless tobacco: Former Systems developer    Quit date: 10/29/2008  Substance and Sexual Activity  .  Alcohol use: No  . Drug use: No  . Sexual activity: Not on file  Lifestyle  . Physical activity:    Days per week: Not on file    Minutes per session: Not on file  . Stress: Not on file  Relationships  . Social connections:    Talks on phone: Not on file    Gets together: Not on file    Attends religious service: Not on file    Active member of club or organization: Not on file    Attends meetings of clubs or organizations: Not on file    Relationship status: Not on file  . Intimate partner violence:    Fear of current or ex partner: Not on file    Emotionally abused: Not on file    Physically abused: Not on file    Forced sexual activity: Not on file  Other Topics Concern  . Not on file  Social History Narrative  . Not on file     BP (!) 140/58   Pulse 60   Ht 5\' 9"  (1.753 m)   Wt 165 lb 6.4 oz (75 kg)   BMI 24.43 kg/m   Physical Exam:  stable appearing elderly man, NAD HEENT: Unremarkable Neck:  6 cm JVD, no thyromegally Lymphatics:  No adenopathy Back:  No CVA tenderness Lungs:  Clear with no wheezes HEART:  Regular rate rhythm, no murmurs, no rubs, no clicks Abd:  soft, positive bowel sounds, no organomegally, no rebound, no guarding Ext:  2 plus pulses, 2+ peripheral edema, no cyanosis, no clubbing Skin:  No rashes no nodules Neuro:  CN II through XII intact, motor grossly intact  EKG - nsr with ventricular pacing  DEVICE  Normal device function.  See PaceArt for details.   Assess/Plan: 1. PAF - he is maintaining NSR today. He will continue his current meds. 2. HTN - his blood pressure is up a bit. I do not want it too  low with his prior stroke.  3. PPM - his ST. Jude DDD PM is working normally. Will recheck in several months.  Mikle Bosworth.D.

## 2018-05-03 ENCOUNTER — Institutional Professional Consult (permissible substitution): Payer: Medicare HMO | Admitting: Pulmonary Disease

## 2018-05-03 ENCOUNTER — Ambulatory Visit: Payer: Medicare HMO | Admitting: Cardiovascular Disease

## 2018-05-03 ENCOUNTER — Encounter: Payer: Self-pay | Admitting: Cardiovascular Disease

## 2018-05-03 VITALS — BP 111/69 | HR 107 | Ht 69.0 in | Wt 165.8 lb

## 2018-05-03 DIAGNOSIS — E039 Hypothyroidism, unspecified: Secondary | ICD-10-CM | POA: Diagnosis not present

## 2018-05-03 DIAGNOSIS — Z950 Presence of cardiac pacemaker: Secondary | ICD-10-CM | POA: Diagnosis not present

## 2018-05-03 DIAGNOSIS — I6521 Occlusion and stenosis of right carotid artery: Secondary | ICD-10-CM

## 2018-05-03 DIAGNOSIS — I495 Sick sinus syndrome: Secondary | ICD-10-CM | POA: Diagnosis not present

## 2018-05-03 DIAGNOSIS — J449 Chronic obstructive pulmonary disease, unspecified: Secondary | ICD-10-CM

## 2018-05-03 DIAGNOSIS — I10 Essential (primary) hypertension: Secondary | ICD-10-CM | POA: Diagnosis not present

## 2018-05-03 DIAGNOSIS — I5032 Chronic diastolic (congestive) heart failure: Secondary | ICD-10-CM

## 2018-05-03 DIAGNOSIS — I25118 Atherosclerotic heart disease of native coronary artery with other forms of angina pectoris: Secondary | ICD-10-CM | POA: Diagnosis not present

## 2018-05-03 MED ORDER — METOPROLOL TARTRATE 75 MG PO TABS: 75.0000 mg | ORAL_TABLET | Freq: Two times a day (BID) | ORAL | 3 refills | Status: DC

## 2018-05-03 NOTE — Patient Instructions (Signed)
Medication Instructions:  INCREASE metoprolol tartrate (Lopressor) to 75 mg two times daily  Follow-Up: 3 months with Dr. Claiborne Billings  Any Other Special Instructions Will Be Listed Below (If Applicable).     If you need a refill on your cardiac medications before your next appointment, please call your pharmacy.

## 2018-05-03 NOTE — Progress Notes (Signed)
Patient ID: John Peck, male   DOB: 1924-07-17, 84 y.o.   MRN: [id number]      HPI: John Peck is a 84 y.o. male who presents to the office today for a 3 month follow-up cardiology evaluation.  Mr. Renteria has  CAD and underwent CABG surgery with LIMA to the LAD, a vein to the circumflex in 1989.  In 2004 he underwent stenting to his RCA. Cardiac catheterization in January 2013 showed an ejection fraction of 50% with mild inferior, distal inferior and apical lateral hypocontractility. He had significant native CAD with 80% stenosis of the LAD after the second diagonal vessel and septal perforator artery with total occlusion of the mid LAD. He was 34 - 70% ostial stenosis in the circumflex vessel followed by 40% proximal stenosis and 7080% distal stenosis. He had an occluded Y graft which previously sequentially supplied the intermediate and distal circumflex.There was a patent stented proximal RCA with 90% stenosis in the anterior RV marginal branch arising from the mid RCA and had 70-80% stenosis in the distal RCA beyond the crux with 78% stenosis the PDA takeoff. His LIMA to LAD was patent. He has been on medical therapy. He has a history of atrial fibrillation, sick sinus syndrome and status post permanent pacemaker in 2009 which is a St. Jude device. Additional problems include iron deficiency anemia, hypertension, BPH, status post TURP in 1992, remote history of liver abscess, history of COPD.  In March 2015 due to recurrent increasing symptoms of chest pain he underwent repeat cardiac catheterization which revealed mild LV dysfunction with an EF of 45% inferior wall hypocontractility. There was significant multivessel native coronary obstructive disease with 50% ostial LAD stenosis followed by 80% proximal stenosis in the region of the first diagonal takeoff followed by 80% stenosis after the second diagonal vessel; total occlusion of the ramus intermediate vessel with evidence for  bidirectional retrograde collateralization; 40% ostial left circumflex stenosis; and widely patent proximal RCA stent with 80% stenosis in the anterior RV marginal branch, 40% mid or CVA stenosis, 50% distal, and 50% ostial and mid PDA stenoses. He had a patent LIMA graft supplying the mid LAD and an occluded Y graft which previously supplied the intermediate and distal circumflex.  He has been on increased medical regimen consisting of aspirin, Plavix, carvedilol 25 mg twice a day, high dose oral nitrate therapy, Ranexa 1000 g twice a day.  Amlodipine 10 mg in addition to Crestor 20 mg.  He also is taking Lasix 20 mg with supplemental potassium.  He was seen in the office by Lesia Hausen and admitted to bilateral lower extremity pain and weakness.  His blood pressure was somewhat low and his amlodipine dose was reduced from 10 to 5 mg. Lower extremity Doppler studies showed an ABI of 0.5 on the right and 0.58 on the left.  He had mild dilatation of his distal abdominal aorta.  There was equal to less than 50% diameter reduction.  The right SFA and right popliteal artery and there was a greater than 60% diameter reduction in the left SFA.  However, there was one vessel runoff via the peroneal artery bilaterally.  His symptoms are worse in the right lower extremity due to his distal disease.  He had done well with his increased medical regimen including Ranexa, high-dose nitrate therapy, amlodipine and carvedilol.  A follow-up Doppler study in August 2015 showed ABI of 0.5 on the right and 0.58 on the left.  He has poor distal  runoff.  His Doppler studies were reviewed with Dr. Gwenlyn Found who agreed with medical therapy.  He already was on aspirin and Plavix  He was hospitalized in January 2017 with chest pain and shortness of breath.  He without for an MI.  He was treated for COPD exacerbation.  He underwent repeat cardiac catheterization by Dr. Julianne Handler.  He was found to have an occluded mid LAD with  presumptive filling of the distal LAD by the LIMA graft, although during this catheterization.  He was unable to selectively engage the LIMA graft.  He had mild proximal disease in the circumflex vessel.  There was a widely patent stent in the RCA, but he developed a new distal RCA stenosis for which he underwent successful intervention.  He also had 90%.  RV marginal stenosis esters Swinson still notes rare to occasional episodes of chest pain which do improve following sl nitroglycerin administration.  He has had some mild blood pressure lability.   When I saw him in 2017, with his episodes of rare to occasional chest pain and blood pressure elevation  I reinitiated amlodipine at 5 mg.  when he was last seen in September 2017.  His blood pressure was improved with the addition of amlodipine.  The past several months, he denies any episodes of chest pain.  He admits to occasional episodes of shortness of breath.  He is unaware of palpitations.  He denies bleeding issues.  He has been on amlodipine at 2.5 mg, carvedilol 25 mg twice a day, isosorbide 120 mg in the morning and 60 mg at night, his internal Ranexa 1000 mg twice a day.  Has been taking furosemide 20 mg daily.  His levothyroxine dose had been increased after his TSH was elevated at 8.29 in September 2017 and on follow-up in October 2017 on 0.125 mg daily TSH was 0.87.   He was admitted to [facility] hospital with increasing episodes of chest pain.  He underwent cardiac catheterization on 02/24/2017 by Dr. Audelia Acton.  His LIMA to LAD was patent.  The vein graft that had supplied the circumflex marginal vessel was occluded and he had 80% ostial circumflex stenosis.  The stents in his RCA proximally and distally were patent.  A Resolute 2.518 mm stent was successfully placed at the circumflex ostium.  He subsequently developed an elevated troponin level and had chest pain in the following day was taken back to the laboratory and repeat catheterization by Dr.  Tamala Julian showed the ostial stent site to be widely patent and no new significant changes from the calf the preceding day.  He has experienced occasional episodes of chest pain since his hospital discharge, which have been nitrate responsive.  He apparently was discharged on aspirin, Plavix, diltiazem 180 mg was started but ultimately was never filled due to concomitant ranolazine administration.  He has been taking isosorbide 30 mg, furosemide 20 mg, addition to his levothyroxine.    For initial follow-up evaluation on 03/08/2017 he was without recurrent chest pain.  I further titrated isosorbide to 60 mg and his blood pressure was elevated and he had continued to experience some mild chest pain. I also recommended resumption of amlodipine.  When last seen in May 2018 cause of some mild symptomatology, I recommended that he take isosorbide 60 mg in the morning and added 30 mg in the evening.    On May 18, 2017  he underwent an ER evaluation for increasing shortness of breath.  His furosemide dose was increased to 40 mg  for 3 days and then he resume taking 20 mg daily.   When I last saw him , he had only experienced very rare short-lived episodes of any chest pain which have been minimal and significant improved from previously following his circumflex stent.  He denies any palpitations.  He denies any presyncope or syncope.  Over 2018.  Lipid studies revealed an LDL cholesterol at 46 on atorvastatin 40 mg and Zetia 10 mg.  He continues to be on isosorbide 90 mg in the morning, 30 mg at night, carvedilol 25 mg twice a day amlodipine 5 mg, Ranexa 1000 mg twice a day, which is controlling his angina.    His pacemaker reached ERI and he underwent a generator change out by Dr. Lovena Le on 12/15/2017.  Apparently when seen in the office for his EP evaluation he was experiencing some symptoms of chest pain but it was hard to be certain and he expressed an interest that he did not want to pursue another cardiac  catheterization.  I last saw him in March 2019 and he was in the office with both his wife and daughter.  He stated that he was feeling well.  However, family members stated that he has experienced episodes of chest pain week previously he took 5 nitroglycerin pills on one Saturday.  He has not had recent chest pain but in the past.  His chest pain typically had improved with just one nitroglycerin.  He denied presyncope or syncope.  He was on  isosorbide 90 mg in the morning and 30 mg at night, ranolazine 1000 mg twice a day, amlodipine 5 mg, carvedilol 25 mg twice a day in addition to furosemide 20 mg daily.  He continues to be on antiplatelet therapy with aspirin and Plavix and combined atorvastatin and Zetia for lipid management.  That time, I recommended further titration of amlodipine to 10 mg daily recommended a follow-up echo Doppler study.  He was hospitalized on April 16 through February 24, 2018 and presented with difficulty speaking, abnormal gait, left facial droop, and leg weakness.  A CT of his head showed no acute intracranial pathology.  The MRI could not be done due to his pacemaker.  Transcranial Doppler revealed elevated mean flow velocities in the left middle and anterior cerebral artery suggesting mild stenosis.  Posterior circulation was not able to be to be visualized.  Neurology felt the symptoms may have been due to cerebral hypoperfusion with high-grade right carotid stenosis noted on ultrasound.  He was seen by Dr. Trula Slade and was evaluated for possible TCAR versus carotid stenting.  He was readmitted to the hospital on May 6 through May 11 with possible multifocal pneumonia, treated with antibiotics.  His respiratory respiratory status improved.  He was treated with IV steroids for COPD exacerbation which later was changed to oral treatment.  Cultures remain negative.  Along his hospitalization he went to skilled nursing facility several weeks.  He is now back home.  He was on oxygen  in the hospital and at the nursing home.  He states his pulse typically has been running in the 60-100 range.  He still notes rare episodes of chest pain for which he takes sublingual nitroglycerin.  He denies any progressive symptomatology.  He presents for reevaluation.    Past Medical History:  Diagnosis Date  . Anemia   . Arthritis   . CAD (coronary artery disease)    a. CABG '89; b. PCI '04; c. 11/2015 Cath/PCI: LM 20ost, LAD 151m LCX  30p, OM2 40, RCA 30p, 10p ISR, 90d (3.0x12 Synergy DES), AM 90, VG->OM2 known to be 100, LIMA->LAD not injected, patent in 2015.  . Carotid stenosis, right   . Chronic combined systolic and diastolic CHF (congestive heart failure) ([place])   . CKD (chronic kidney disease), stage III ([place])   . H/O: GI bleed    REMOTE HISTORY  . History of COPD   . Hyperlipidemia   . Hypertensive heart disease   . LBBB (left bundle branch block)   . Pacemaker Sept 2009   St Jude  . PAF (paroxysmal atrial fibrillation) (HCC)    a. not on anticoag due to prior GIB.  . Sick sinus syndrome [facility]) Sept 2009   ST Jude PTVDP  . Stroke [facility])     Past Surgical History:  Procedure Laterality Date  . CARDIAC CATHETERIZATION  11/2011   EF 50%; significant native CAD w/80% stenosis of the LAD after 2nd giagonal vessel and septal perforating artery w/total occlusion of the mid left anterior descendiung.; 60-70% ostiasl stenosis in the circumflex vessel followed by 40% proximal stenosis and 70-80% distal circumflex stenosis;   . CARDIAC CATHETERIZATION N/A 11/29/2015   Procedure: Left Heart Cath and Cors/Grafts Angiography;  Surgeon: Burnell Blanks, MD;  Location: [facility] CV LAB;  Service: Cardiovascular;  Laterality: N/A;  . CARDIAC CATHETERIZATION N/A 11/29/2015   Procedure: Coronary Stent Intervention;  Surgeon: Burnell Blanks, MD;  Location: [place] CV LAB;  Service: Cardiovascular;  Laterality: N/A;  . CATARACT EXTRACTION  2004  . CORONARY ANGIOGRAPHY N/A  02/25/2017   Procedure: Coronary Angiography;  Surgeon: Belva Crome, MD;  Location: [place] CV LAB;  Service: Cardiovascular;  Laterality: N/A;  . CORONARY ANGIOPLASTY WITH STENT PLACEMENT  2004   RCA  . CORONARY ARTERY BYPASS GRAFT  1989   had LIMA to his LAD, a vein to the circumflex. In 2004 underwent stenting to his right coronary artery.  . CORONARY STENT INTERVENTION N/A 02/24/2017   Procedure: Coronary Stent Intervention;  Surgeon: Wellington Hampshire, MD;  Location: [place] CV LAB;  Service: Cardiovascular;  Laterality: N/A;  cfx  . HEMORRHOID SURGERY    . INSERT / REPLACE / REMOVE PACEMAKER  07/17/08   DUAL-CHAMBER; PPM-ST.JUDE MEDNET  . LEFT HEART CATH AND CORS/GRAFTS ANGIOGRAPHY N/A 02/24/2017   Procedure: Left Heart Cath and Cors/Grafts Angiography;  Surgeon: Wellington Hampshire, MD;  Location: [place] CV LAB;  Service: Cardiovascular;  Laterality: N/A;  . LEFT HEART CATHETERIZATION WITH CORONARY/GRAFT ANGIOGRAM N/A 12/10/2011   Procedure: LEFT HEART CATHETERIZATION WITH Beatrix Fetters;  Surgeon: Troy Sine, MD;  Location: [facility] CATH LAB;  Service: Cardiovascular;  Laterality: N/A;  . LEFT HEART CATHETERIZATION WITH CORONARY/GRAFT ANGIOGRAM N/A 01/26/2014   Procedure: LEFT HEART CATHETERIZATION WITH Beatrix Fetters;  Surgeon: Troy Sine, MD;  Location: [facility] CATH LAB;  Service: Cardiovascular;  Laterality: N/A;  . PPM GENERATOR CHANGEOUT N/A 12/15/2017   Procedure: PPM GENERATOR CHANGEOUT;  Surgeon: Evans Lance, MD;  Location: [place] CV LAB;  Service: Cardiovascular;  Laterality: N/A;  . PROSTATECTOMY      No Known Allergies  Current Outpatient Medications  Medication Sig Dispense Refill  . albuterol (PROVENTIL HFA;VENTOLIN HFA) 108 (90 Base) MCG/ACT inhaler Inhale 2 puffs into the lungs every 4 (four) hours as needed for wheezing or shortness of breath (or coughing). (Patient taking differently: Inhale 1 puff into the lungs every 4 (four) hours as  needed for wheezing or shortness of  breath (or coughing). ) 1 Inhaler 0  . aspirin 81 MG tablet Take 81 mg by mouth daily.     Marland Kitchen atorvastatin (LIPITOR) 40 MG tablet Take 1 tablet (40 mg total) by mouth daily. 30 tablet 6  . cholecalciferol (VITAMIN D) 1000 units tablet Take 1,000 Units by mouth daily.    . clopidogrel (PLAVIX) 75 MG tablet Take 75 mg by mouth daily at 3 pm.     . docusate sodium (COLACE) 100 MG capsule Take 1 capsule (100 mg total) by mouth daily as needed for mild constipation. 10 capsule 0  . ezetimibe (ZETIA) 10 MG tablet Take 10 mg by mouth daily.    Marland Kitchen ezetimibe (ZETIA) 10 MG tablet TAKE 1 TABLET BY MOUTH EVERY DAY 90 tablet 2  . ferrous sulfate 325 (65 FE) MG tablet Take 325 mg by mouth 2 (two) times daily with a meal.     . furosemide (LASIX) 20 MG tablet Take 1 tablet (20 mg total) by mouth daily. 30 tablet 0  . guaiFENesin (ROBITUSSIN) 100 MG/5ML SOLN Take 5 mLs by mouth every 4 (four) hours as needed for cough or to loosen phlegm.    Marland Kitchen guaiFENesin-dextromethorphan (ROBITUSSIN DM) 100-10 MG/5ML syrup Take 10 mLs by mouth every 6 (six) hours. 118 mL 0  . ipratropium-albuterol (DUONEB) 0.5-2.5 (3) MG/3ML SOLN Take 3 mLs by nebulization 2 (two) times daily. 360 mL 1  . isosorbide mononitrate (IMDUR) 60 MG 24 hr tablet Take 35m (1.5 tablets) in the AM and 339m(1/2 tablet) in the PM (Patient taking differently: Take 30-90 mg by mouth See admin instructions. Take 9015m1.5 tablets) in the AM and 47m3m/2 tablet) in the PM) 180 tablet 3  . latanoprost (XALATAN) 0.005 % ophthalmic solution Place 1 drop into both eyes 2 (two) times a week.   4  . levothyroxine (SYNTHROID, LEVOTHROID) 125 MCG tablet TAKE 1 TABLET BY MOUTH EVERY DAY BEFORE BREAKFAST 30 tablet 5  . Menthol, Topical Analgesic, (ICY HOT EX) Apply 1 application topically daily as needed (aches and pains).    . Metoprolol Tartrate 75 MG TABS Take 75 mg by mouth 2 (two) times daily. 180 tablet 3  . nitroGLYCERIN  (NITROSTAT) 0.4 MG SL tablet PLACE 1 TABLET UNDER THE TONGUE AS NEEDED. (Patient taking differently: PLACE 0.4 MG UNDER THE TONGUE AS NEEDED FOR CHEST PAIN.) 25 tablet 3  . OVER THE COUNTER MEDICATION Take 1 capsule by mouth daily as needed ("Prune-Lax" for constipation).    . pantoprazole (PROTONIX) 40 MG tablet Take 40 mg by mouth 2 (two) times daily.    . polyethylene glycol (MIRALAX / GLYCOLAX) packet Take 17 g by mouth daily as needed for moderate constipation. 14 each 0  . ranolazine (RANEXA) 1000 MG SR tablet Take 1 tablet (1,000 mg total) by mouth 2 (two) times daily. 180 tablet 1  . tamsulosin (FLOMAX) 0.4 MG CAPS capsule Take 0.4 mg by mouth daily.  2   No current facility-administered medications for this visit.     Social History   Socioeconomic History  . Marital status: Married    Spouse name: Not on file  . Number of children: 5  . Years of education: Not on file  . Highest education level: Not on file  Occupational History  . Occupation: RETIRED    Employer: RET[place]    [place]: Not on file  . Food insecurity:    Worry: Not on  file    Inability: Not on file  . Transportation needs:    Medical: Not on file    Non-medical: Not on file  Tobacco Use  . Smoking status: Former Smoker    Packs/day: 0.75    Years: 65.00    Pack years: 48.75    Types: Cigarettes    Last attempt to quit: 11/09/1997    Years since quitting: 20.5  . Smokeless tobacco: Former Systems developer    Quit date: 10/29/2008  Substance and Sexual Activity  . Alcohol use: No  . Drug use: No  . Sexual activity: Not on file  Lifestyle  . Physical activity:    Days per week: Not on file    Minutes per session: Not on file  . Stress: Not on file  Relationships  . Social connections:    Talks on phone: Not on file    Gets together: Not on file    Attends religious service: Not on file    Active member of club or organization: Not on file    Attends  meetings of clubs or organizations: Not on file    Relationship status: Not on file  . Intimate partner violence:    Fear of current or ex partner: Not on file    Emotionally abused: Not on file    Physically abused: Not on file    Forced sexual activity: Not on file  Other Topics Concern  . Not on file  Social History Narrative  . Not on file    Family History  Problem Relation Age of Onset  . Diabetes Father   . Stroke Sister   . Heart disease Sister   . Heart disease Brother   . Heart attack Brother   . Stroke Brother   . Hypertension Neg Hx     ROS General: Negative; No fevers, chills, or night sweats;  HEENT: Negative; No changes in vision or hearing, sinus congestion, difficulty swallowing Pulmonary: Positive for recent treatment for possible pneumonia Cardiovascular: See history of present illness  Positive for claudication, right lower extremity greater than left. GI: Negative; No nausea, vomiting, diarrhea, or abdominal pain GU: Negative; No dysuria, hematuria, or difficulty voiding Musculoskeletal: Negative; no myalgias, joint pain, or weakness Hematologic/Oncology: Negative; no easy bruising, bleeding Endocrine: Negative; no heat/cold intolerance; no diabetes Neuro: Recent hospitalization with left facial droop, and transient weakness. Skin: Negative; No rashes or skin lesions Psychiatric: Negative; No behavioral problems, depression Sleep: Negative; No snoring, daytime sleepiness, hypersomnolence, bruxism, restless legs, hypnogognic hallucinations, no cataplexy Other comprehensive 14 point system review is negative.   PE BP 111/69   Pulse (!) 107   Ht _0  (1.753 m)   Wt 165 lb 12.8 oz (75.2 kg)   BMI 24.48 kg/m    Repeat blood pressure 114/70  Wt Readings from Last 3 Encounters:  05/03/18 165 lb 12.8 oz (75.2 kg)  05/02/18 165 lb 6.4 oz (75 kg)  04/07/18 167 lb (75.8 kg)   General: Alert, oriented, no distress.  Skin: normal turgor, no rashes,  warm and dry HEENT: Normocephalic, atraumatic. Pupils equal round and reactive to light; sclera anicteric; extraocular muscles intact;  Nose without nasal septal hypertrophy Mouth/Parynx benign; Mallinpatti scale 3 Neck: No JVD, ; normal carotid upstroke Lungs: clear to ausculatation and percussion; no wheezing or rales Chest wall: without tenderness to palpitation Heart: PMI not displaced, RRR, s1 s2 normal, 1/6 systolic murmur, no diastolic murmur, no rubs, gallops, thrills, or heaves Abdomen: soft, nontender; no hepatosplenomehaly,  BS+; abdominal aorta nontender and not dilated by palpation. Back: no CVA tenderness Pulses 2+ Musculoskeletal: full range of motion, normal strength, no joint deformities Extremities: no clubbing cyanosis or edema, Homan's sign negative  Neurologic: Mild residual left facial droop Psychologic: Normal mood and affect   ECG (independently read by me): Regular paced rhythm at 107 bpm.  100% capture.  March 2019.  ECG (independently read by me): V paced rhythm at 62 bpm.  December 2018 ECG (independently read by me): AV paced rhythm at 66 bpm.  PR interval 206 msec.  PVCs  September 2018 ECG (independently read by me): AV paced rhythm at 60 bpm.  May 2018 ECG (independently read by me): AV paced with 100% capture and a ventricular rate at 60 bpm.  PR interval 202 ms.  03/08/2017 ECG (independently read by me): AV paced rhythm with 100% capture.  Heart rate 60 bpm.  PR interval 206 ms.  01/12/2017 ECG (independently read by me): Paced rhythm with 100% capture at 61 bpm.  September 2017 ECG (independently read by me): AV paced rhythm at 70 bpm.  PR interval 206 ms.  May 2017 ECG (independently read by me): AV paced rhythm at 60 bpm.  PR interval 202 ms.  October 2016 ECG (independently read by me): AV dual paced rhythm with 100% capture.  Ventricular rate 60 bpm.  PR interval 206 ms.  April 2016 ECG  (Independently read by me): Ventricular paced rhythm at  62 bpm; unchanged  October 2015 ECG  (Independently read by me): Ventricular paced rhythm.  Unchanged from prior ECG.  LABS:  BMP Latest Ref Rng & Units 03/19/2018 03/17/2018 03/16/2018  Glucose 65 - 99 mg/dL 109(H) 195(H) 147(H)  BUN 6 - 20 mg/dL 28(H) 36(H) 36(H)  Creatinine 0.61 - 1.24 mg/dL 1.39(H) 1.56(H) 1.53(H)  BUN/Creat Ratio 10 - 24 - - -  Sodium 135 - 145 mmol/L 135 134(L) 136  Potassium 3.5 - 5.1 mmol/L 4.5 4.8 4.2  Chloride 101 - 111 mmol/L 104 106 108  CO2 22 - 32 mmol/L 26 19(L) 20(L)  Calcium 8.9 - 10.3 mg/dL 8.0(L) 8.4(L) 8.1(L)    Hepatic Function Latest Ref Rng & Units 03/15/2018 03/14/2018 02/23/2018  Total Protein 6.5 - 8.1 g/dL 5.9(L) 5.9(L) -  Albumin 3.5 - 5.0 g/dL 2.5(L) 2.7(L) 3.4(L)  AST 15 - 41 U/L 39 15 -  ALT 17 - 63 U/L 43 24 -  Alk Phosphatase 38 - 126 U/L 52 47 -  Total Bilirubin 0.3 - 1.2 mg/dL 1.6(H) 1.6(H) -  Bilirubin, Direct 0.1 - 0.5 mg/dL - - -     CBC Latest Ref Rng & Units 03/19/2018 03/16/2018 03/14/2018  WBC 4.0 - 10.5 K/uL 5.4 4.9 2.7(L)  Hemoglobin 13.0 - 17.0 g/dL 9.0(L) 9.0(L) 9.9(L)  Hematocrit 39.0 - 52.0 % 27.5(L) 27.1(L) 29.7(L)  Platelets 150 - 400 K/uL 118(L) 91(L) 81(L)    Lab Results  Component Value Date   TSH 2.390 07/28/2017      BNP    Component Value Date/Time   PROBNP 304.0 (H) 11/12/2008 1154    Lipid Panel     Component Value Date/Time   CHOL 118 02/22/2018 0115   CHOL 128 07/28/2017 0813   TRIG 56 02/22/2018 0115   HDL 49 02/22/2018 0115   HDL 59 07/28/2017 0813   CHOLHDL 2.4 02/22/2018 0115   VLDL 11 02/22/2018 0115   LDLCALC 58 02/22/2018 0115   LDLCALC 54 07/28/2017 0813     RADIOLOGY: No results  found.  IMPRESSION:  1. Coronary artery disease of native artery of native heart with stable angina pectoris ([place])   2. Essential hypertension   3. Chronic diastolic CHF (congestive heart failure) ([place])   4. Sick sinus syndrome ([place])   5. Pacemaker   6. Carotid artery stenosis, symptomatic, right     7. Hypothyroidism, unspecified type   8. Chronic obstructive pulmonary disease, unspecified COPD type [facility])     ASSESSMENT AND PLAN: Mr. Pitre is an 84 year old gentleman who underwent initial bypass surgery in 1989 and stenting to his RCA in 2004.  He had been fairly stable on a medical regimen for his CAD until he developed recurrent chest pain symptomatology in the setting of possible COPD exacerbation in January 2017.  Repeat cardiac catheterization at that time revealed progression of his distal RCA which was successfully stented.  He continues to be on medical therapy for his CAD and has documented occluded graft which had supplied his circumflex.  He had been on amlodipine, which was started for elevated blood pressure and when I saw him previously but he had experienced bilateral pedal and ankle edema, right leg greater than left and amlodipine was reduced to 2.5 mg daily.  He was hospitalized in April 2018 with unstable angina.  He underwent repeat catheterization and PCI on 02/24/2017 with successful stenting to the circumflex ostium, which remained patent on repeat evaluation the following day, despite elevation of troponin levels.  He had experienced recurrent anginal symptomatology.  He is now 6 weeks following his pacemaker generator change out after his prior pacemaker had reached ERI.  I reviewed his 2 hospitalizations subsequent to my last office visit.  He was felt to have suffered a possible small stroke secondary to cerebral hypoperfusion and was  found to have a very high bifurcation with tortuous carotid vessels.  Velocities in the right ICA were consistent with 80 to 99% stenosis velocities in the left were consistent with 40 to 59% stenoses.  He is under evaluation by Dr. Velta Addison.  He recently developed respiratory decompensation and was treated for possible pneumonia and COPD exacerbation.  Presently, his blood pressure is stable on metoprolol 50 mg twice a day, isosorbide 90 mg  in the morning and 30 mg at night.  He continues to also be on ranolazine 1000 mg twice a day for his coronary obstructive disease and is not had any unstable anginal symptoms.  He is on atorvastatin 40 mg daily and Zetia 10 mg for hyperlipidemia with target LDL less than 70.  He continues to be on dual antiplatelet therapy with aspirin and Plavix.  There is no bleeding.  His last echo Doppler study which I reviewed from Mar 17, 2018 showed an EF of 45 to 50% with moderate LVH.  There was inferior wall hypokinesis.  There was mild aortic regurgitation, moderate mitral regurgitation and moderate tricuspid regurgitation.  PA peak pressure was mildly increased at 48 mm consistent with mild pulmonary hypertension.  At present, he will continue following up with Dr. Pearlean Brownie.  He recently had a pacemaker rechecked by Dr. Lovena Le and has normal device function.  I have recommended further titration of metoprolol to 75 mg twice a day see if this can improve some of his anginal discomfort.  Most recent LDL cholesterol was 58 on February 22, 2018 on his statin Zetia combination.  He is on levothyroxine 125 mcg for hypothyroidism.  I will see him in 2 to 3 months for reevaluation.   Time spent:  30 minutes  Troy Sine, MD, [facility]  05/08/2018 9:26 AM

## 2018-05-06 DIAGNOSIS — R350 Frequency of micturition: Secondary | ICD-10-CM | POA: Diagnosis not present

## 2018-05-06 DIAGNOSIS — S31829A Unspecified open wound of left buttock, initial encounter: Secondary | ICD-10-CM | POA: Diagnosis not present

## 2018-05-08 ENCOUNTER — Encounter: Payer: Self-pay | Admitting: Cardiovascular Disease

## 2018-05-12 DIAGNOSIS — M6281 Muscle weakness (generalized): Secondary | ICD-10-CM | POA: Diagnosis not present

## 2018-05-12 DIAGNOSIS — R269 Unspecified abnormalities of gait and mobility: Secondary | ICD-10-CM | POA: Diagnosis not present

## 2018-05-12 DIAGNOSIS — J449 Chronic obstructive pulmonary disease, unspecified: Secondary | ICD-10-CM | POA: Diagnosis not present

## 2018-05-24 ENCOUNTER — Emergency Department (HOSPITAL_COMMUNITY): Payer: Medicare HMO

## 2018-05-24 ENCOUNTER — Encounter (HOSPITAL_COMMUNITY): Payer: Self-pay | Admitting: Emergency Medicine

## 2018-05-24 ENCOUNTER — Inpatient Hospital Stay (HOSPITAL_COMMUNITY)
Admission: EM | Admit: 2018-05-24 | Discharge: 2018-05-27 | DRG: 291 | Disposition: A | Discharge status: Skilled Nursing Facility | Payer: Medicare HMO | Attending: Family Medicine | Admitting: Family Medicine

## 2018-05-24 DIAGNOSIS — Z7982 Long term (current) use of aspirin: Secondary | ICD-10-CM

## 2018-05-24 DIAGNOSIS — M6281 Muscle weakness (generalized): Secondary | ICD-10-CM | POA: Diagnosis not present

## 2018-05-24 DIAGNOSIS — R0602 Shortness of breath: Secondary | ICD-10-CM | POA: Diagnosis not present

## 2018-05-24 DIAGNOSIS — J441 Chronic obstructive pulmonary disease with (acute) exacerbation: Secondary | ICD-10-CM | POA: Diagnosis not present

## 2018-05-24 DIAGNOSIS — R498 Other voice and resonance disorders: Secondary | ICD-10-CM | POA: Diagnosis not present

## 2018-05-24 DIAGNOSIS — I130 Hypertensive heart and chronic kidney disease with heart failure and stage 1 through stage 4 chronic kidney disease, or unspecified chronic kidney disease: Secondary | ICD-10-CM | POA: Diagnosis not present

## 2018-05-24 DIAGNOSIS — J918 Pleural effusion in other conditions classified elsewhere: Secondary | ICD-10-CM | POA: Diagnosis present

## 2018-05-24 DIAGNOSIS — I10 Essential (primary) hypertension: Secondary | ICD-10-CM | POA: Diagnosis present

## 2018-05-24 DIAGNOSIS — D61818 Other pancytopenia: Secondary | ICD-10-CM | POA: Diagnosis present

## 2018-05-24 DIAGNOSIS — Z743 Need for continuous supervision: Secondary | ICD-10-CM | POA: Diagnosis not present

## 2018-05-24 DIAGNOSIS — I5043 Acute on chronic combined systolic (congestive) and diastolic (congestive) heart failure: Secondary | ICD-10-CM | POA: Diagnosis not present

## 2018-05-24 DIAGNOSIS — Z8673 Personal history of transient ischemic attack (TIA), and cerebral infarction without residual deficits: Secondary | ICD-10-CM

## 2018-05-24 DIAGNOSIS — J9601 Acute respiratory failure with hypoxia: Secondary | ICD-10-CM | POA: Diagnosis not present

## 2018-05-24 DIAGNOSIS — J90 Pleural effusion, not elsewhere classified: Secondary | ICD-10-CM | POA: Diagnosis not present

## 2018-05-24 DIAGNOSIS — R41841 Cognitive communication deficit: Secondary | ICD-10-CM | POA: Diagnosis not present

## 2018-05-24 DIAGNOSIS — Z79899 Other long term (current) drug therapy: Secondary | ICD-10-CM | POA: Diagnosis not present

## 2018-05-24 DIAGNOSIS — R079 Chest pain, unspecified: Secondary | ICD-10-CM | POA: Diagnosis not present

## 2018-05-24 DIAGNOSIS — I509 Heart failure, unspecified: Secondary | ICD-10-CM

## 2018-05-24 DIAGNOSIS — Z7951 Long term (current) use of inhaled steroids: Secondary | ICD-10-CM | POA: Diagnosis not present

## 2018-05-24 DIAGNOSIS — Z951 Presence of aortocoronary bypass graft: Secondary | ICD-10-CM

## 2018-05-24 DIAGNOSIS — Z87891 Personal history of nicotine dependence: Secondary | ICD-10-CM

## 2018-05-24 DIAGNOSIS — I2510 Atherosclerotic heart disease of native coronary artery without angina pectoris: Secondary | ICD-10-CM | POA: Diagnosis not present

## 2018-05-24 DIAGNOSIS — J449 Chronic obstructive pulmonary disease, unspecified: Secondary | ICD-10-CM

## 2018-05-24 DIAGNOSIS — Z7902 Long term (current) use of antithrombotics/antiplatelets: Secondary | ICD-10-CM

## 2018-05-24 DIAGNOSIS — R609 Edema, unspecified: Secondary | ICD-10-CM

## 2018-05-24 DIAGNOSIS — I480 Paroxysmal atrial fibrillation: Secondary | ICD-10-CM

## 2018-05-24 DIAGNOSIS — E039 Hypothyroidism, unspecified: Secondary | ICD-10-CM | POA: Diagnosis present

## 2018-05-24 DIAGNOSIS — N183 Chronic kidney disease, stage 3 unspecified: Secondary | ICD-10-CM | POA: Diagnosis present

## 2018-05-24 DIAGNOSIS — R531 Weakness: Secondary | ICD-10-CM | POA: Diagnosis not present

## 2018-05-24 DIAGNOSIS — R2689 Other abnormalities of gait and mobility: Secondary | ICD-10-CM | POA: Diagnosis not present

## 2018-05-24 DIAGNOSIS — I495 Sick sinus syndrome: Secondary | ICD-10-CM | POA: Diagnosis not present

## 2018-05-24 DIAGNOSIS — E785 Hyperlipidemia, unspecified: Secondary | ICD-10-CM | POA: Diagnosis present

## 2018-05-24 DIAGNOSIS — Z955 Presence of coronary angioplasty implant and graft: Secondary | ICD-10-CM

## 2018-05-24 DIAGNOSIS — R1312 Dysphagia, oropharyngeal phase: Secondary | ICD-10-CM | POA: Diagnosis not present

## 2018-05-24 DIAGNOSIS — I447 Left bundle-branch block, unspecified: Secondary | ICD-10-CM | POA: Diagnosis present

## 2018-05-24 DIAGNOSIS — I5022 Chronic systolic (congestive) heart failure: Secondary | ICD-10-CM | POA: Diagnosis present

## 2018-05-24 DIAGNOSIS — R279 Unspecified lack of coordination: Secondary | ICD-10-CM | POA: Diagnosis not present

## 2018-05-24 DIAGNOSIS — I110 Hypertensive heart disease with heart failure: Secondary | ICD-10-CM | POA: Diagnosis not present

## 2018-05-24 DIAGNOSIS — R0902 Hypoxemia: Secondary | ICD-10-CM | POA: Diagnosis not present

## 2018-05-24 DIAGNOSIS — Z950 Presence of cardiac pacemaker: Secondary | ICD-10-CM | POA: Diagnosis not present

## 2018-05-24 DIAGNOSIS — J948 Other specified pleural conditions: Secondary | ICD-10-CM | POA: Diagnosis not present

## 2018-05-24 DIAGNOSIS — L8990 Pressure ulcer of unspecified site, unspecified stage: Secondary | ICD-10-CM | POA: Diagnosis present

## 2018-05-24 LAB — BASIC METABOLIC PANEL
ANION GAP: 8 (ref 5–15)
BUN: 20 mg/dL (ref 8–23)
CHLORIDE: 100 mmol/L (ref 98–111)
CO2: 26 mmol/L (ref 22–32)
Calcium: 8.4 mg/dL — ABNORMAL LOW (ref 8.9–10.3)
Creatinine, Ser: 1.56 mg/dL — ABNORMAL HIGH (ref 0.61–1.24)
GFR calc non Af Amer: 37 mL/min — ABNORMAL LOW (ref >60)
GFR, EST AFRICAN AMERICAN: 43 mL/min — AB (ref >60)
Glucose, Bld: 130 mg/dL — ABNORMAL HIGH (ref 70–99)
POTASSIUM: 4.2 mmol/L (ref 3.5–5.1)
SODIUM: 134 mmol/L — AB (ref 135–145)

## 2018-05-24 LAB — URINALYSIS, ROUTINE W REFLEX MICROSCOPIC
Bilirubin Urine: NEGATIVE
Glucose, UA: NEGATIVE mg/dL
Hgb urine dipstick: NEGATIVE
KETONES UR: NEGATIVE mg/dL
LEUKOCYTES UA: NEGATIVE
NITRITE: NEGATIVE
Protein, ur: NEGATIVE mg/dL
SPECIFIC GRAVITY, URINE: 1.005 (ref 1.005–1.030)
pH: 5 (ref 5.0–8.0)

## 2018-05-24 LAB — CBC
HCT: 31.1 % — ABNORMAL LOW (ref 39.0–52.0)
HEMOGLOBIN: 10 g/dL — AB (ref 13.0–17.0)
MCH: 31.6 pg (ref 26.0–34.0)
MCHC: 32.2 g/dL (ref 30.0–36.0)
MCV: 98.4 fL (ref 78.0–100.0)
Platelets: 139 10*3/uL — ABNORMAL LOW (ref 150–400)
RBC: 3.16 MIL/uL — AB (ref 4.22–5.81)
RDW: 16.6 % — ABNORMAL HIGH (ref 11.5–15.5)
WBC: 3.6 10*3/uL — AB (ref 4.0–10.5)

## 2018-05-24 LAB — TROPONIN I: Troponin I: 0.03 ng/mL (ref <0.03)

## 2018-05-24 LAB — BRAIN NATRIURETIC PEPTIDE: B Natriuretic Peptide: 1210.4 pg/mL — ABNORMAL HIGH (ref 0.0–100.0)

## 2018-05-24 MED ORDER — ISOSORBIDE MONONITRATE ER 30 MG PO TB24
90.0000 mg | ORAL_TABLET | Freq: Every day | ORAL | Status: DC
  Administered 2018-05-25 – 2018-05-27 (×3): 90 mg via ORAL
  Filled 2018-05-24 (×3): qty 3

## 2018-05-24 MED ORDER — CARVEDILOL 12.5 MG PO TABS
25.0000 mg | ORAL_TABLET | Freq: Two times a day (BID) | ORAL | Status: DC
  Administered 2018-05-25 – 2018-05-26 (×3): 25 mg via ORAL
  Filled 2018-05-24 (×4): qty 2

## 2018-05-24 MED ORDER — FUROSEMIDE 10 MG/ML IJ SOLN: 40.0000 mg | Freq: Two times a day (BID) | INTRAMUSCULAR | Status: DC

## 2018-05-24 MED ORDER — ONDANSETRON HCL 4 MG/2ML IJ SOLN: 4.0000 mg | Freq: Four times a day (QID) | INTRAMUSCULAR | Status: DC | PRN

## 2018-05-24 MED ORDER — LATANOPROST 0.005 % OP SOLN
1.0000 [drp] | OPHTHALMIC | Status: DC
  Administered 2018-05-26: 1 [drp] via OPHTHALMIC
  Filled 2018-05-24 (×2): qty 2.5

## 2018-05-24 MED ORDER — PANTOPRAZOLE SODIUM 40 MG PO TBEC
40.0000 mg | DELAYED_RELEASE_TABLET | Freq: Two times a day (BID) | ORAL | Status: DC
  Administered 2018-05-24 – 2018-05-26 (×4): 40 mg via ORAL
  Filled 2018-05-24 (×4): qty 1

## 2018-05-24 MED ORDER — METHYLPREDNISOLONE SODIUM SUCC 125 MG IJ SOLR
60.0000 mg | Freq: Four times a day (QID) | INTRAMUSCULAR | Status: DC
Start: 2018-05-25 — End: 2018-05-25
  Administered 2018-05-24: 60 mg via INTRAVENOUS
  Filled 2018-05-24: qty 2

## 2018-05-24 MED ORDER — TAMSULOSIN HCL 0.4 MG PO CAPS
0.4000 mg | ORAL_CAPSULE | Freq: Every day | ORAL | Status: DC
  Administered 2018-05-24 – 2018-05-27 (×4): 0.4 mg via ORAL
  Filled 2018-05-24 (×4): qty 1

## 2018-05-24 MED ORDER — ACETAMINOPHEN 325 MG PO TABS
650.0000 mg | ORAL_TABLET | ORAL | Status: DC | PRN
  Administered 2018-05-27: 650 mg via ORAL
  Filled 2018-05-24: qty 2

## 2018-05-24 MED ORDER — SODIUM CHLORIDE 0.9 % IV SOLN
500.0000 mg | INTRAVENOUS | Status: DC
  Administered 2018-05-25: 500 mg via INTRAVENOUS
  Filled 2018-05-24 (×2): qty 500

## 2018-05-24 MED ORDER — VITAMIN D 1000 UNITS PO TABS
1000.0000 [IU] | ORAL_TABLET | Freq: Every day | ORAL | Status: DC
  Administered 2018-05-25 – 2018-05-27 (×3): 1000 [IU] via ORAL
  Filled 2018-05-24 (×3): qty 1

## 2018-05-24 MED ORDER — ALBUTEROL SULFATE (2.5 MG/3ML) 0.083% IN NEBU
2.5000 mg | INHALATION_SOLUTION | RESPIRATORY_TRACT | Status: DC | PRN
  Administered 2018-05-26 (×2): 2.5 mg via RESPIRATORY_TRACT
  Filled 2018-05-24 (×2): qty 3

## 2018-05-24 MED ORDER — ALPRAZOLAM 0.25 MG PO TABS: 0.2500 mg | ORAL_TABLET | Freq: Two times a day (BID) | ORAL | Status: DC | PRN

## 2018-05-24 MED ORDER — ATORVASTATIN CALCIUM 20 MG PO TABS
40.0000 mg | ORAL_TABLET | Freq: Every day | ORAL | Status: DC
  Administered 2018-05-25 – 2018-05-27 (×3): 40 mg via ORAL
  Filled 2018-05-24 (×3): qty 2

## 2018-05-24 MED ORDER — RANOLAZINE ER 500 MG PO TB12
1000.0000 mg | ORAL_TABLET | Freq: Two times a day (BID) | ORAL | Status: DC
  Administered 2018-05-24 – 2018-05-27 (×6): 1000 mg via ORAL
  Filled 2018-05-24 (×6): qty 2

## 2018-05-24 MED ORDER — IPRATROPIUM-ALBUTEROL 0.5-2.5 (3) MG/3ML IN SOLN
3.0000 mL | Freq: Once | RESPIRATORY_TRACT | Status: AC
  Administered 2018-05-24: 3 mL via RESPIRATORY_TRACT
  Filled 2018-05-24: qty 3

## 2018-05-24 MED ORDER — SODIUM CHLORIDE 0.9% FLUSH: 3.0000 mL | INTRAVENOUS | Status: DC | PRN

## 2018-05-24 MED ORDER — IPRATROPIUM-ALBUTEROL 0.5-2.5 (3) MG/3ML IN SOLN: 3.0000 mL | Freq: Two times a day (BID) | RESPIRATORY_TRACT | Status: DC

## 2018-05-24 MED ORDER — METHYLPREDNISOLONE SODIUM SUCC 125 MG IJ SOLR
125.0000 mg | Freq: Once | INTRAMUSCULAR | Status: AC
  Administered 2018-05-24: 125 mg via INTRAVENOUS
  Filled 2018-05-24: qty 2

## 2018-05-24 MED ORDER — ISOSORBIDE MONONITRATE ER 30 MG PO TB24
30.0000 mg | ORAL_TABLET | Freq: Every day | ORAL | Status: DC
  Administered 2018-05-24 – 2018-05-26 (×3): 30 mg via ORAL
  Filled 2018-05-24 (×3): qty 1

## 2018-05-24 MED ORDER — EZETIMIBE 10 MG PO TABS
10.0000 mg | ORAL_TABLET | Freq: Every day | ORAL | Status: DC
  Administered 2018-05-25 – 2018-05-27 (×3): 10 mg via ORAL
  Filled 2018-05-24 (×3): qty 1

## 2018-05-24 MED ORDER — ASPIRIN EC 81 MG PO TBEC
81.0000 mg | DELAYED_RELEASE_TABLET | Freq: Every day | ORAL | Status: DC
  Administered 2018-05-26 – 2018-05-27 (×2): 81 mg via ORAL
  Filled 2018-05-24 (×2): qty 1

## 2018-05-24 MED ORDER — IPRATROPIUM-ALBUTEROL 0.5-2.5 (3) MG/3ML IN SOLN
3.0000 mL | Freq: Two times a day (BID) | RESPIRATORY_TRACT | Status: DC
  Administered 2018-05-25 – 2018-05-27 (×5): 3 mL via RESPIRATORY_TRACT
  Filled 2018-05-24 (×6): qty 3

## 2018-05-24 MED ORDER — SODIUM CHLORIDE 0.9% FLUSH
3.0000 mL | Freq: Two times a day (BID) | INTRAVENOUS | Status: DC
  Administered 2018-05-24 – 2018-05-27 (×6): 3 mL via INTRAVENOUS

## 2018-05-24 MED ORDER — LEVOTHYROXINE SODIUM 125 MCG PO TABS
125.0000 ug | ORAL_TABLET | Freq: Every day | ORAL | Status: DC
  Administered 2018-05-25 – 2018-05-27 (×3): 125 ug via ORAL
  Filled 2018-05-24 (×5): qty 1

## 2018-05-24 MED ORDER — SODIUM CHLORIDE 0.9 % IV SOLN
250.0000 mL | INTRAVENOUS | Status: DC | PRN
  Administered 2018-05-24: 250 mL via INTRAVENOUS

## 2018-05-24 MED ORDER — CLOPIDOGREL BISULFATE 75 MG PO TABS
75.0000 mg | ORAL_TABLET | Freq: Every day | ORAL | Status: DC
  Administered 2018-05-25 – 2018-05-27 (×3): 75 mg via ORAL
  Filled 2018-05-24 (×3): qty 1

## 2018-05-24 MED ORDER — FUROSEMIDE 10 MG/ML IJ SOLN
40.0000 mg | Freq: Once | INTRAMUSCULAR | Status: AC
  Administered 2018-05-24: 40 mg via INTRAVENOUS
  Filled 2018-05-24: qty 4

## 2018-05-24 NOTE — Progress Notes (Signed)
Right lower extremity venous duplex completed. There is no obvious evidence of a DVT or Baker's cyst. Toma Copier. RVS 05/24/2018 6:43 PM

## 2018-05-24 NOTE — ED Provider Notes (Signed)
[facility] Provider Note   CSN: [id number] Arrival date & time: 05/24/18  1628     History   Chief Complaint Chief Complaint  Patient presents with  . Shortness of Breath    HPI LEN John Peck is a 84 y.o. male.  HPI Patient presents to the emergency room for evaluation of shortness of breath and weakness.  Patient resides at home.  According to the Peck report physical therapy was with him today when he started to feel short of breath and had generalized weakness.  Patient is somewhat difficult to understand but he indicates he has been having trouble with shortness of breath for the past week.  Today the symptoms became more severe.  He also was feeling very weak and it was hard for him to stand.  He did have some pain in his chest as well.  He denies any trouble with fevers or coughing.  He has noticed leg swelling.  Patient denies using oxygen at home.  He does have a history of COPD as well as congestive heart failure.   Past Medical History:  Diagnosis Date  . Anemia   . Arthritis   . CAD (coronary artery disease)    a. CABG '89; b. PCI '04; c. 11/2015 Cath/PCI: LM 20ost, LAD 122m, LCX 30p, OM2 40, RCA 30p, 10p ISR, 90d (3.0x12 Synergy DES), AM 90, VG->OM2 known to be 100, LIMA->LAD not injected, patent in 2015.  . Carotid stenosis, right   . Chronic combined systolic and diastolic CHF (congestive heart failure) ([place])   . CKD (chronic kidney disease), stage III ([place])   . H/O: GI bleed    REMOTE HISTORY  . History of COPD   . Hyperlipidemia   . Hypertensive heart disease   . LBBB (left bundle branch block)   . Pacemaker Sept 2009   St Jude  . PAF (paroxysmal atrial fibrillation) (HCC)    a. not on anticoag due to prior GIB.  . Sick sinus syndrome [facility]) Sept 2009   ST Jude PTVDP  . Stroke [facility])     Patient Active Problem List   Diagnosis Date Noted  . Acute respiratory distress   . Non-ST elevation (NSTEMI) myocardial  infarction ([place])   . Atrial fibrillation with RVR ([place])   . HCAP (healthcare-associated pneumonia) 03/14/2018  . Chronic combined systolic and diastolic CHF (congestive heart failure) ([place]) 03/14/2018  . COPD clinical dx/ no pfts on record 03/14/2018  . H/O: GI bleed 03/14/2018  . CKD (chronic kidney disease), stage III ([place]) 03/14/2018  . Anemia 03/14/2018  . Paroxysmal atrial fibrillation ([place]) 03/14/2018  . Hypothyroidism 03/14/2018  . Carotid stenosis, right 03/14/2018  . Bilateral conjunctivitis 03/14/2018  . Facial droop 02/22/2018  . COPD with acute exacerbation ([place]) 02/22/2018  . Thrombocytopenia ([place]) 02/22/2018  . Generalized weakness   . CAD (coronary artery disease)   . Hypertensive heart disease   . Acute renal failure superimposed on stage 3 chronic kidney disease ([place])   . Hyperlipidemia   . Stented coronary artery   . Coronary artery disease involving native coronary artery of native heart with unstable angina pectoris ([place])   . Chest pain 11/27/2015  . Reactive airway disease 11/27/2015  . Angina pectoris ([place]) 11/27/2015  . CAD in native artery 09/03/2015  . Hyperkalemia 05/09/2015  . Weakness 05/09/2015  . Acute on chronic kidney failure ([place]) 05/09/2015  . Chronic diastolic CHF (congestive heart failure) ([place]) 05/09/2015  . Hyperlipidemia LDL goal <  70 02/28/2015  . Atherosclerosis of lower extremity with claudication ([place]) 07/17/2014  . Unstable angina ([place]) 01/23/2014  . PAF (paroxysmal atrial fibrillation) ([place]) 03/24/2013  . Lower extremity edema 03/24/2013  . Sinoatrial node dysfunction ([place]) 08/17/2012  . History of iron deficiency anemia 09/18/2011  . Essential hypertension 07/07/2009  . INTERMEDIATE CORONARY SYNDROME 07/07/2009  . S/P CABG x 2 '89. RCA stent'04. last cath Jan 2013 07/07/2009  . PACEMAKER, PERMANENT- St Jude Sept 2009 07/07/2009  . Sick sinus syndrome ([place]) 07/10/2008  . Pacemaker 07/10/2008    Past Surgical History:  Procedure  Laterality Date  . CARDIAC CATHETERIZATION  11/2011   EF 50%; significant native CAD w/80% stenosis of the LAD after 2nd giagonal vessel and septal perforating artery w/total occlusion of the mid left anterior descendiung.; 60-70% ostiasl stenosis in the circumflex vessel followed by 40% proximal stenosis and 70-80% distal circumflex stenosis;   . CARDIAC CATHETERIZATION N/A 11/29/2015   Procedure: Left Heart Cath and Cors/Grafts Angiography;  Surgeon: Burnell Blanks, MD;  Location: [place] CV LAB;  Service: Cardiovascular;  Laterality: N/A;  . CARDIAC CATHETERIZATION N/A 11/29/2015   Procedure: Coronary Stent Intervention;  Surgeon: Burnell Blanks, MD;  Location: [place] CV LAB;  Service: Cardiovascular;  Laterality: N/A;  . CATARACT EXTRACTION  2004  . CORONARY ANGIOGRAPHY N/A 02/25/2017   Procedure: Coronary Angiography;  Surgeon: Belva Crome, MD;  Location: [place] CV LAB;  Service: Cardiovascular;  Laterality: N/A;  . CORONARY ANGIOPLASTY WITH STENT PLACEMENT  2004   RCA  . CORONARY ARTERY BYPASS GRAFT  1989   had LIMA to his LAD, a vein to the circumflex. In 2004 underwent stenting to his right coronary artery.  . CORONARY STENT INTERVENTION N/A 02/24/2017   Procedure: Coronary Stent Intervention;  Surgeon: Wellington Hampshire, MD;  Location: [place] CV LAB;  Service: Cardiovascular;  Laterality: N/A;  cfx  . HEMORRHOID SURGERY    . INSERT / REPLACE / REMOVE PACEMAKER  07/17/08   DUAL-CHAMBER; PPM-ST.JUDE MEDNET  . LEFT HEART CATH AND CORS/GRAFTS ANGIOGRAPHY N/A 02/24/2017   Procedure: Left Heart Cath and Cors/Grafts Angiography;  Surgeon: Wellington Hampshire, MD;  Location: [place] CV LAB;  Service: Cardiovascular;  Laterality: N/A;  . LEFT HEART CATHETERIZATION WITH CORONARY/GRAFT ANGIOGRAM N/A 12/10/2011   Procedure: LEFT HEART CATHETERIZATION WITH Beatrix Fetters;  Surgeon: [facility] Sine, MD;  Location: [facility] CATH LAB;  Service: Cardiovascular;   Laterality: N/A;  . LEFT HEART CATHETERIZATION WITH CORONARY/GRAFT ANGIOGRAM N/A 01/26/2014   Procedure: LEFT HEART CATHETERIZATION WITH Beatrix Fetters;  Surgeon: [facility] Sine, MD;  Location: [facility] CATH LAB;  Service: Cardiovascular;  Laterality: N/A;  . PPM GENERATOR CHANGEOUT N/A 12/15/2017   Procedure: PPM GENERATOR CHANGEOUT;  Surgeon: Evans Lance, MD;  Location: [place] CV LAB;  Service: Cardiovascular;  Laterality: N/A;  . PROSTATECTOMY          Home Medications    Prior to Admission medications   Medication Sig Start Date End Date Taking? Authorizing Provider  albuterol (PROVENTIL HFA;VENTOLIN HFA) 108 (90 Base) MCG/ACT inhaler Inhale 2 puffs into the lungs every 4 (four) hours as needed for wheezing or shortness of breath (or coughing). Patient taking differently: Inhale 1 puff into the lungs every 4 (four) hours as needed for wheezing or shortness of breath (or coughing).  1/51/76  Yes Delora Fuel, MD  aspirin 81 MG tablet Take 81 mg by mouth daily.    Yes [provider]  atorvastatin (LIPITOR) 40 MG tablet Take 1 tablet (40 mg total) by mouth daily. 09/18/15  Yes [facility] Sine, MD  carvedilol (COREG) 25 MG tablet Take 25 mg by mouth 2 (two) times daily.   Yes [provider]  cephALEXin (KEFLEX) 500 MG capsule Take 500 mg by mouth 2 (two) times daily. For 7 days 05/10/18 05/24/18 Yes [provider]  cholecalciferol (VITAMIN D) 1000 units tablet Take 1,000 Units by mouth daily.   Yes [provider]  clopidogrel (PLAVIX) 75 MG tablet Take 75 mg by mouth daily at 3 pm.    Yes [provider]  docusate sodium (COLACE) 100 MG capsule Take 1 capsule (100 mg total) by mouth daily as needed for mild constipation. 02/26/17  Yes Bhagat, Bhavinkumar, PA  ezetimibe (ZETIA) 10 MG tablet Take 10 mg by mouth daily.   Yes [provider]  ferrous sulfate 325 (65 FE) MG tablet Take 325 mg by mouth 2 (two) times daily with a meal.     Yes [provider]  furosemide (LASIX) 20 MG tablet Take 1 tablet (20 mg total) by mouth daily. Patient taking differently: Take 20 mg by mouth every other day. On odd days 03/19/18  Yes Adhikari, Amrit, MD  ipratropium-albuterol (DUONEB) 0.5-2.5 (3) MG/3ML SOLN Take 3 mLs by nebulization 2 (two) times daily. 02/24/18  Yes Mikhail, Velta Addison, DO  isosorbide mononitrate (IMDUR) 60 MG 24 hr tablet Take 90mg  (1.5 tablets) in the AM and 30mg  (1/2 tablet) in the PM Patient taking differently: Take 30-90 mg by mouth See admin instructions. Take 90mg  (1.5 tablets) in the AM and 30mg  (1/2 tablet) in the PM 10/26/17  Yes [facility] Sine, MD  latanoprost (XALATAN) 0.005 % ophthalmic solution Place 1 drop into both eyes 2 (two) times a week.  11/07/15  Yes [provider]  levothyroxine (SYNTHROID, LEVOTHROID) 125 MCG tablet TAKE 1 TABLET BY MOUTH EVERY DAY BEFORE BREAKFAST 04/05/18  Yes [facility] Sine, MD  Menthol, Topical Analgesic, (ICY HOT EX) Apply 1 application topically daily as needed (aches and pains).   Yes [provider]  Metoprolol Tartrate 75 MG TABS Take 75 mg by mouth 2 (two) times daily. Patient taking differently: Take 50 mg by mouth 2 (two) times daily.  05/03/18  Yes [facility] Sine, MD  nitroGLYCERIN (NITROSTAT) 0.4 MG SL tablet PLACE 1 TABLET UNDER THE TONGUE AS NEEDED. Patient taking differently: PLACE 0.4 MG UNDER THE TONGUE AS NEEDED FOR CHEST PAIN. 07/06/16  Yes [facility] Sine, MD  pantoprazole (PROTONIX) 40 MG tablet Take 40 mg by mouth 2 (two) times daily.   Yes [provider]  polyethylene glycol (MIRALAX / GLYCOLAX) packet Take 17 g by mouth daily as needed for moderate constipation. 03/19/18  Yes Shelly Coss, MD  ranolazine (RANEXA) 1000 MG SR tablet Take 1 tablet (1,000 mg total) by mouth 2 (two) times daily. 11/27/15  Yes [facility] Sine, MD  tamsulosin (FLOMAX) 0.4 MG CAPS capsule Take 0.4 mg by mouth daily. 02/26/15  Yes [provider]  ezetimibe (ZETIA) 10 MG tablet TAKE 1 TABLET BY MOUTH EVERY DAY Patient not taking: Reported on 05/24/2018 04/25/18   [facility] Sine, MD  guaiFENesin-dextromethorphan (ROBITUSSIN DM) 100-10 MG/5ML syrup Take 10 mLs by mouth every 6 (six) hours. Patient not taking: Reported on 05/24/2018 03/19/18   Shelly Coss, MD    Family History Family History  Problem Relation Age of Onset  . Diabetes Father   . Stroke  Sister   . Heart disease Sister   . Heart disease Brother   . Heart attack Brother   . Stroke Brother   . Hypertension Neg Hx     Social History Social History   Tobacco Use  . Smoking status: Former Smoker    Packs/day: 0.75    Years: 65.00    Pack years: 48.75    Types: Cigarettes    Last attempt to quit: 11/09/1997    Years since quitting: 20.5  . Smokeless tobacco: Former Systems developer    Quit date: 10/29/2008  Substance Use Topics  . Alcohol use: No  . Drug use: No     Allergies   Patient has no known allergies.   Review of Systems Review of Systems  Constitutional: Negative for fever.  Respiratory: Negative for cough.   Neurological: Negative for syncope.  All other systems reviewed and are negative.    Physical Exam Updated Vital Signs BP (!) 151/93   Pulse (!) 59   Temp (!) 97.3 F (36.3 C) (Oral)   Resp (!) 24   SpO2 100%   Physical Exam  Constitutional: No distress.  Elderly, frail  HENT:  Head: Normocephalic and atraumatic.  Right Ear: External ear normal.  Left Ear: External ear normal.  Eyes: Conjunctivae are normal. Right eye exhibits no discharge. Left eye exhibits no discharge. No scleral icterus.  Neck: Neck supple. No tracheal deviation present.  Cardiovascular: Normal rate, regular rhythm and intact distal pulses.  Pulmonary/Chest: No stridor. No respiratory distress. He has decreased breath sounds. He has wheezes. He has no rales.  Labored breathing, difficulty speaking in long sentences  Abdominal: Soft. Bowel sounds  are normal. He exhibits no distension. There is no tenderness. There is no rebound and no guarding.  Musculoskeletal: He exhibits no tenderness.       Right lower leg: He exhibits edema.       Left lower leg: He exhibits edema.  Edema of the right leg greater than the left  Neurological: He is alert. He has normal strength. No cranial nerve deficit (no facial droop, extraocular movements intact, no slurred speech) or sensory deficit. He exhibits normal muscle tone. He displays no seizure activity. Coordination normal.  Skin: Skin is warm and dry. No rash noted.  Psychiatric: He has a normal mood and affect.  Nursing note and vitals reviewed.    ED Treatments / Results  Labs (all labs ordered are listed, but only abnormal results are displayed) Labs Reviewed  BASIC METABOLIC PANEL - Abnormal; Notable for the following components:      Result Value   Sodium 134 (*)    Glucose, Bld 130 (*)    Creatinine, Ser 1.56 (*)    Calcium 8.4 (*)    GFR calc non Af Amer 37 (*)    GFR calc Af Amer 43 (*)    All other components within normal limits  CBC - Abnormal; Notable for the following components:   WBC 3.6 (*)    RBC 3.16 (*)    Hemoglobin 10.0 (*)    HCT 31.1 (*)    RDW 16.6 (*)    Platelets 139 (*)    All other components within normal limits  TROPONIN I - Abnormal; Notable for the following components:   Troponin I 0.03 (*)    All other components within normal limits  BRAIN NATRIURETIC PEPTIDE - Abnormal; Notable for the following components:   B Natriuretic Peptide 1,210.4 (*)    All other  components within normal limits  URINALYSIS, ROUTINE W REFLEX MICROSCOPIC    EKG EKG Interpretation  Date/Time:  Tuesday May 24 2018 16:29:35 EDT Ventricular Rate:  60 PR Interval:    QRS Duration: 110 QT Interval:  445 QTC Calculation: 445 R Axis:   -22 Text Interpretation:  Junctional rhythm Borderline left axis deviation Anterior infarct, age indeterminate Lateral leads are also  involved Since last tracing rate slower Confirmed by Dorie Rank [phone]) on 05/24/2018 4:38:03 PM   Radiology Dg Chest Port 1 View  Result Date: 05/24/2018 CLINICAL DATA:  SOB Pt was working with physical therapy at home when he started feeling SOB and having generalized weakness. Pt has on demand pacemaker and Peck noticed it was pacing at random rates varying from 50-100. Pt has hx of CHF and CABG No chest pain EXAM: PORTABLE CHEST 1 VIEW COMPARISON:  04/15/2018 FINDINGS: Moderate to large right pleural effusion. Right basilar airspace opacity compatible with atelectasis or pneumonia. Mildly confluent airspace opacity at the right lung apex, roughly similar to prior. Bilateral interstitial accentuation with mild cardiomegaly and prior median sternotomy and CABG. Minimal blunting of the left lateral costophrenic angle with indistinct airspace opacity at left lung base. Dual lead pacer in place. IMPRESSION: 1. Enlarged right pleural effusion with stable right apical airspace opacity and increased bibasilar airspace opacities potentially from edema or multifocal pneumonia. 2. Small left pleural effusion. 3. Mild to moderate enlargement of the cardiopericardial silhouette with prior CABG. Electronically Signed   By: Van Clines M.D.   On: 05/24/2018 18:02    Procedures .Critical Care Performed by: Dorie Rank, MD Authorized by: Dorie Rank, MD   Critical care provider statement:    Critical care time (minutes):  35   Critical care was time spent personally by me on the following activities:  Discussions with consultants, evaluation of patient's response to treatment, examination of patient, ordering and performing treatments and interventions, ordering and review of laboratory studies, ordering and review of radiographic studies, pulse oximetry, re-evaluation of patient's condition, obtaining history from patient or surrogate and review of old charts   (including critical care time)  Medications  Ordered in ED Medications  furosemide (LASIX) injection 40 mg (has no administration in time range)  ipratropium-albuterol (DUONEB) 0.5-2.5 (3) MG/3ML nebulizer solution 3 mL (3 mLs Nebulization Given 05/24/18 1709)  methylPREDNISolone sodium succinate (SOLU-MEDROL) 125 mg/2 mL injection 125 mg (125 mg Intravenous Given 05/24/18 1704)     Initial Impression / Assessment and Plan / ED Course  I have reviewed the triage vital signs and the nursing notes.  Pertinent labs & imaging results that were available during my care of the patient were reviewed by me and considered in my medical decision making (see chart for details).  Clinical Course as of May 25 1847  Tue May 24, 2018  4944 Chest x-ray shows pleural effusions and findings consistent with congestive heart failure.   [JK]  1838 Laboratory ests are notable for stable anemia associated with an elevated BNP.  Findings are consistent with congestive heart failure.   [JK]  1847 DVT study negative   [JK]    Clinical Course User Index [JK] Dorie Rank, MD    Patient presented to the emergency room for evaluation of shortness of breath.  ED work-up notable for signs of congestive heart failure and a pleural effusion.  I think the pleural effusion is most likely related to his CHF.  Patient's pulse ox is stable with supplemental oxygen.  IV diuretics  have been ordered.  Plan on admission to the hospital for further treatment.  Final Clinical Impressions(s) / ED Diagnoses   Final diagnoses:  Acute on chronic congestive heart failure, unspecified heart failure type ([place])  Pleural effusion      Dorie Rank, MD 05/24/18 [phone]

## 2018-05-24 NOTE — ED Notes (Signed)
Patient's daughter called OUT for RN reporting that patient is calling for help. When RN arrived to the room PT states "I can't breathe," oximetry is at 88%, then slowly drops to 69% at room air. Nasal canula applied up to 5lit, with no improvement, NRB applied to positive response-> He is now 100% on NRB  Family at bedside

## 2018-05-24 NOTE — H&P (Signed)
History and Physical    John Peck YCX:[id number] DOB: 11/03/1924 DOA: 05/24/2018  PCP: Seward Carol, MD   Patient coming from: Home  Chief Complaint: SOB, leg swelling, malaise   HPI: John Peck is a 84 y.o. male with medical history significant for chronic combined CHF, paroxysmal atrial fibrillation no longer on anticoagulation due to history of GI bleed, sick sinus syndrome with pacer, coronary artery disease, COPD, and hypothyroidism, now presenting to the emergency department for evaluation of shortness of breath, swelling, and general malaise.  Patient is accompanied by his daughters who assist with the history.  He has reportedly developed progressive dyspnea over the past week with bilateral leg swelling and a general malaise.  Symptoms have been worse with exertion and he has also been symptomatic at rest for the past couple days.  He tried to work with home PT today but was too dyspneic and generally weak.  This prompted his transport to the ED for further evaluation.  Patient reports some chest discomfort earlier in the week, but none now.  He denies any significant cough or sputum production.  ED Course: Upon arrival to the ED, patient is found to be afebrile, requiring supplemental oxygen in order to maintain saturation of 90%, tachypneic, bradycardic, and with stable blood pressure.  EKG features a junctional rhythm with anterolateral T wave abnormality.  Chest x-ray is notable for enlarged right pleural effusion, stable right apical airspace opacity, and increased bibasilar opacities concerning for pneumonia or edema.  Chemistry panel features a stable creatinine at 1.56 and CBC is notable for pancytopenia with WBC 3600, hemoglobin 10.0, and platelets 139,000.  Troponin is slightly elevated to 0.03 and BNP is elevated at 1210.  Patient was treated with 40 mg IV Lasix in the ED, DuoNeb's, 125 mg IV Solu-Medrol, and pacer is being interrogated.  Right lower extremity venous  Doppler was performed with preliminary read negative for DVT.  Patient has begun to diurese, continues to have diffuse wheezes and dyspnea at rest, and will be admitted for ongoing evaluation and management.  Review of Systems:  All other systems reviewed and apart from HPI, are negative.  Past Medical History:  Diagnosis Date  . Anemia   . Arthritis   . CAD (coronary artery disease)    a. CABG '89; b. PCI '04; c. 11/2015 Cath/PCI: LM 20ost, LAD 167m, LCX 30p, OM2 40, RCA 30p, 10p ISR, 90d (3.0x12 Synergy DES), AM 90, VG->OM2 known to be 100, LIMA->LAD not injected, patent in 2015.  . Carotid stenosis, right   . Chronic combined systolic and diastolic CHF (congestive heart failure) ([place])   . CKD (chronic kidney disease), stage III ([place])   . H/O: GI bleed    REMOTE HISTORY  . History of COPD   . Hyperlipidemia   . Hypertensive heart disease   . LBBB (left bundle branch block)   . Pacemaker Sept 2009   St Jude  . PAF (paroxysmal atrial fibrillation) (H[facility])    a. not on anticoag due to prior GIB.  . Sick sinus syndrome [facility]) Sept 2009   ST Jude PTVDP  . Stroke [facility])     Past Surgical History:  Procedure Laterality Date  . CARDIAC CATHETERIZATION  11/2011   EF 50%; significant native CAD w/80% stenosis of the LAD after 2nd giagonal vessel and septal perforating artery w/total occlusion of the mid left anterior descendiung.; 60-70% ostiasl stenosis in the circumflex vessel followed by 40% proximal stenosis and 70-80% distal circumflex stenosis;   .  CARDIAC CATHETERIZATION N/A 11/29/2015   Procedure: Left Heart Cath and Cors/Grafts Angiography;  Surgeon: Burnell Blanks, MD;  Location: [place] CV LAB;  Service: Cardiovascular;  Laterality: N/A;  . CARDIAC CATHETERIZATION N/A 11/29/2015   Procedure: Coronary Stent Intervention;  Surgeon: Burnell Blanks, MD;  Location: [place] CV LAB;  Service: Cardiovascular;  Laterality: N/A;  . CATARACT EXTRACTION  2004  . CORONARY  ANGIOGRAPHY N/A 02/25/2017   Procedure: Coronary Angiography;  Surgeon: Belva Crome, MD;  Location: [place] CV LAB;  Service: Cardiovascular;  Laterality: N/A;  . CORONARY ANGIOPLASTY WITH STENT PLACEMENT  2004   RCA  . CORONARY ARTERY BYPASS GRAFT  1989   had LIMA to his LAD, a vein to the circumflex. In 2004 underwent stenting to his right coronary artery.  . CORONARY STENT INTERVENTION N/A 02/24/2017   Procedure: Coronary Stent Intervention;  Surgeon: Wellington Hampshire, MD;  Location: [place] CV LAB;  Service: Cardiovascular;  Laterality: N/A;  cfx  . HEMORRHOID SURGERY    . INSERT / REPLACE / REMOVE PACEMAKER  07/17/08   DUAL-CHAMBER; PPM-ST.JUDE MEDNET  . LEFT HEART CATH AND CORS/GRAFTS ANGIOGRAPHY N/A 02/24/2017   Procedure: Left Heart Cath and Cors/Grafts Angiography;  Surgeon: Wellington Hampshire, MD;  Location: [place] CV LAB;  Service: Cardiovascular;  Laterality: N/A;  . LEFT HEART CATHETERIZATION WITH CORONARY/GRAFT ANGIOGRAM N/A 12/10/2011   Procedure: LEFT HEART CATHETERIZATION WITH Beatrix Fetters;  Surgeon: Troy Sine, MD;  Location: [facility] CATH LAB;  Service: Cardiovascular;  Laterality: N/A;  . LEFT HEART CATHETERIZATION WITH CORONARY/GRAFT ANGIOGRAM N/A 01/26/2014   Procedure: LEFT HEART CATHETERIZATION WITH Beatrix Fetters;  Surgeon: Troy Sine, MD;  Location: [facility] CATH LAB;  Service: Cardiovascular;  Laterality: N/A;  . PPM GENERATOR CHANGEOUT N/A 12/15/2017   Procedure: PPM GENERATOR CHANGEOUT;  Surgeon: Evans Lance, MD;  Location: [place] CV LAB;  Service: Cardiovascular;  Laterality: N/A;  . PROSTATECTOMY       reports that he quit smoking about 20 years ago. His smoking use included cigarettes. He has a 48.75 pack-year smoking history. He quit smokeless tobacco use about 9 years ago. He reports that he does not drink alcohol or use drugs.  No Known Allergies  Family History  Problem Relation Age of Onset  . Diabetes Father   . Stroke  Sister   . Heart disease Sister   . Heart disease Brother   . Heart attack Brother   . Stroke Brother   . Hypertension Neg Hx      Prior to Admission medications   Medication Sig Start Date End Date Taking? Authorizing Provider  albuterol (PROVENTIL HFA;VENTOLIN HFA) 108 (90 Base) MCG/ACT inhaler Inhale 2 puffs into the lungs every 4 (four) hours as needed for wheezing or shortness of breath (or coughing). Patient taking differently: Inhale 1 puff into the lungs every 4 (four) hours as needed for wheezing or shortness of breath (or coughing).  03/27/83  Yes Delora Fuel, MD  aspirin 81 MG tablet Take 81 mg by mouth daily.    Yes [provider]  atorvastatin (LIPITOR) 40 MG tablet Take 1 tablet (40 mg total) by mouth daily. 09/18/15  Yes Troy Sine, MD  carvedilol (COREG) 25 MG tablet Take 25 mg by mouth 2 (two) times daily.   Yes [provider]  cholecalciferol (VITAMIN D) 1000 units tablet Take 1,000 Units by mouth daily.   Yes [provider]  clopidogrel (PLAVIX) 75 MG tablet  Take 75 mg by mouth daily at 3 pm.    Yes [provider]  docusate sodium (COLACE) 100 MG capsule Take 1 capsule (100 mg total) by mouth daily as needed for mild constipation. 02/26/17  Yes Bhagat, Bhavinkumar, PA  ezetimibe (ZETIA) 10 MG tablet Take 10 mg by mouth daily.   Yes [provider]  ferrous sulfate 325 (65 FE) MG tablet Take 325 mg by mouth 2 (two) times daily with a meal.    Yes [provider]  furosemide (LASIX) 20 MG tablet Take 1 tablet (20 mg total) by mouth daily. Patient taking differently: Take 20 mg by mouth every other day. On odd days 03/19/18  Yes Adhikari, Amrit, MD  ipratropium-albuterol (DUONEB) 0.5-2.5 (3) MG/3ML SOLN Take 3 mLs by nebulization 2 (two) times daily. 02/24/18  Yes Mikhail, Velta Addison, DO  isosorbide mononitrate (IMDUR) 60 MG 24 hr tablet Take 90mg  (1.5 tablets) in the AM and 30mg  (1/2 tablet) in the PM Patient taking  differently: Take 30-90 mg by mouth See admin instructions. Take 90mg  (1.5 tablets) in the AM and 30mg  (1/2 tablet) in the PM 10/26/17  Yes Troy Sine, MD  latanoprost (XALATAN) 0.005 % ophthalmic solution Place 1 drop into both eyes 2 (two) times a week.  11/07/15  Yes [provider]  levothyroxine (SYNTHROID, LEVOTHROID) 125 MCG tablet TAKE 1 TABLET BY MOUTH EVERY DAY BEFORE BREAKFAST 04/05/18  Yes Troy Sine, MD  Menthol, Topical Analgesic, (ICY HOT EX) Apply 1 application topically daily as needed (aches and pains).   Yes [provider]  Metoprolol Tartrate 75 MG TABS Take 75 mg by mouth 2 (two) times daily. Patient taking differently: Take 50 mg by mouth 2 (two) times daily.  05/03/18  Yes Troy Sine, MD  nitroGLYCERIN (NITROSTAT) 0.4 MG SL tablet PLACE 1 TABLET UNDER THE TONGUE AS NEEDED. Patient taking differently: PLACE 0.4 MG UNDER THE TONGUE AS NEEDED FOR CHEST PAIN. 07/06/16  Yes Troy Sine, MD  pantoprazole (PROTONIX) 40 MG tablet Take 40 mg by mouth 2 (two) times daily.   Yes [provider]  polyethylene glycol (MIRALAX / GLYCOLAX) packet Take 17 g by mouth daily as needed for moderate constipation. 03/19/18  Yes Shelly Coss, MD  ranolazine (RANEXA) 1000 MG SR tablet Take 1 tablet (1,000 mg total) by mouth 2 (two) times daily. 11/27/15  Yes Troy Sine, MD  tamsulosin (FLOMAX) 0.4 MG CAPS capsule Take 0.4 mg by mouth daily. 02/26/15  Yes [provider]  guaiFENesin-dextromethorphan (ROBITUSSIN DM) 100-10 MG/5ML syrup Take 10 mLs by mouth every 6 (six) hours. Patient not taking: Reported on 05/24/2018 03/19/18   Shelly Coss, MD    Physical Exam: Vitals:   05/24/18 1715 05/24/18 1900 05/24/18 1915 05/24/18 1930  BP: (!) 151/93 (!) 141/78 (!) 147/75 (!) 153/84  Pulse: (!) 59 64 65 (!) 59  Resp: (!) 24 19 18  (!) 25  Temp:      TempSrc:      SpO2: 100% 99% 100% 100%      Constitutional: NAD, calm  Eyes: PERTLA, lids  and conjunctivae normal ENMT: Mucous membranes are moist. Posterior pharynx clear of any exudate or lesions.   Neck: normal, supple, no masses, no thyromegaly Respiratory: Tachypneic, dyspneic with speech. Breath sounds diminished bilaterally, coarse rales bilaterally, diffuse wheezes. No accessory muscle use.  Cardiovascular: S1 & S2 heard, regular rate and rhythm. Pretibial pitting edema bilaterally. JVP 10 cmH2O. Abdomen: No distension, no tenderness, soft.  Bowel sounds normal.  Musculoskeletal: no clubbing / cyanosis. No joint deformity upper and lower extremities.   Skin: no significant rashes, lesions, ulcers. Warm, dry, well-perfused. Neurologic: No facial asymmetry. Sensation intact. Strength 5/5 in all 4 limbs.  Psychiatric: Alert and oriented to person, place, and situation. Calm and cooperative.     Labs on Admission: I have personally reviewed following labs and imaging studies  CBC: Recent Labs  Lab 05/24/18 1712  WBC 3.6*  HGB 10.0*  HCT 31.1*  MCV 98.4  PLT 761*   Basic Metabolic Panel: Recent Labs  Lab 05/24/18 1712  NA 134*  K 4.2  CL 100  CO2 26  GLUCOSE 130*  BUN 20  CREATININE 1.56*  CALCIUM 8.4*   GFR: CrCl cannot be calculated (Unknown ideal weight.). Liver Function Tests: No results for input(s): AST, ALT, ALKPHOS, BILITOT, PROT, ALBUMIN in the last 168 hours. No results for input(s): LIPASE, AMYLASE in the last 168 hours. No results for input(s): AMMONIA in the last 168 hours. Coagulation Profile: No results for input(s): INR, PROTIME in the last 168 hours. Cardiac Enzymes: Recent Labs  Lab 05/24/18 1712  TROPONINI 0.03*   BNP (last 3 results) No results for input(s): PROBNP in the last 8760 hours. HbA1C: No results for input(s): HGBA1C in the last 72 hours. CBG: No results for input(s): GLUCAP in the last 168 hours. Lipid Profile: No results for input(s): CHOL, HDL, LDLCALC, TRIG, CHOLHDL, LDLDIRECT in the last 72 hours. Thyroid  Function Tests: No results for input(s): TSH, T4TOTAL, FREET4, T3FREE, THYROIDAB in the last 72 hours. Anemia Panel: No results for input(s): VITAMINB12, FOLATE, FERRITIN, TIBC, IRON, RETICCTPCT in the last 72 hours. Urine analysis:    Component Value Date/Time   COLORURINE AMBER (A) 03/14/2018 1023   APPEARANCEUR CLEAR 03/14/2018 1023   LABSPEC 1.025 03/14/2018 1023   PHURINE 5.0 03/14/2018 1023   GLUCOSEU NEGATIVE 03/14/2018 1023   HGBUR NEGATIVE 03/14/2018 1023   BILIRUBINUR SMALL (A) 03/14/2018 1023   KETONESUR NEGATIVE 03/14/2018 1023   PROTEINUR NEGATIVE 03/14/2018 1023   UROBILINOGEN 0.2 05/09/2015 1523   NITRITE NEGATIVE 03/14/2018 1023   LEUKOCYTESUR NEGATIVE 03/14/2018 1023   Sepsis Labs: @LABRCNTIP (procalcitonin:4,lacticidven:4) )No results found for this or any previous visit (from the past 240 hour(s)).   Radiological Exams on Admission: Dg Chest Port 1 View  Result Date: 05/24/2018 CLINICAL DATA:  SOB Pt was working with physical therapy at home when he started feeling SOB and having generalized weakness. Pt has on demand pacemaker and EMS noticed it was pacing at random rates varying from 50-100. Pt has hx of CHF and CABG No chest pain EXAM: PORTABLE CHEST 1 VIEW COMPARISON:  04/15/2018 FINDINGS: Moderate to large right pleural effusion. Right basilar airspace opacity compatible with atelectasis or pneumonia. Mildly confluent airspace opacity at the right lung apex, roughly similar to prior. Bilateral interstitial accentuation with mild cardiomegaly and prior median sternotomy and CABG. Minimal blunting of the left lateral costophrenic angle with indistinct airspace opacity at left lung base. Dual lead pacer in place. IMPRESSION: 1. Enlarged right pleural effusion with stable right apical airspace opacity and increased bibasilar airspace opacities potentially from edema or multifocal pneumonia. 2. Small left pleural effusion. 3. Mild to moderate enlargement of the  cardiopericardial silhouette with prior CABG. Electronically Signed   By: Van Clines M.D.   On: 05/24/2018 18:02    EKG: Independently reviewed. Junctional rhythm, anterolateral T-wave abnormalities.   Assessment/Plan   1. Acute hypoxic respiratory failure  -  Presents with ~1 wk of progressive SOB  - Found to be hypoxic at rest with peripheral edema, JVD, wheezing, pleural effusion  - Likely multifactorial with contributions from CHF, COPD, and pleural effusion, addressed individually below    2. Acute on chronic combined CHF  - Presents with SOB, suspected to be multifactorial as above  - There is peripheral edema and JVD on exam, marked BNP elevation, and opacities on CXR felt to reflect edema  - Treated in ED with Lasix 40 mg IV and has begun to diurese  - Continue diuresis with Lasix 40 mg IV q12h, continue beta-blocker as tolerated, continue cardiac monitoring, follow daily wt and I/O's, echo was done in May with results in EMR    3. Right pleural effusion  - Presents with SOB, suspected to be multifactorial as above - There is increase in previously identified right pleural effusion, now moderate-to-large on CXR  - Suspected secondary to CHF, pneumonia or malignancy less-likely  - Consult with IR for thoracentesis with fluid analysis    4. COPD with acute exacerbation  - Presents with SOB, suspected to be multifactorial as above  - There is diffuse wheezes on admission despite 2 neb treatments and systemic steroids in ED  - Check sputum culture, continue systemic steroid, start azithromycin, continue scheduled Duoneb, prn albuterol neb, prn supplemental O2    5. Pancytopenia  - WBC is 3,600 on admission, similar to priors and without definite infection, though PNA is considered  - Hgb and platelet counts are improved from recent priors and there is no active bleeding   6. CKD stage III  - SCr is 1.56 on admission, consistent with priors  - Renally-dose medications,  follow daily chem panel during diuresis    7. PAF, sick-sinus syndrome  - He is s/p PPM and no longer anticoagulated after GIB  - In a junctional rhythm on admission, pacer to be interrogated in ED    DVT prophylaxis: SCD's  Code Status: Full  Family Communication: Daughters updated at bedside Consults called: None  Admission status: Inpatient     Vianne Bulls, MD [facility] Pager [phone]  If 7PM-7AM, please contact night-coverage [email or web address] Password [facility]  05/24/2018, 7:56 PM

## 2018-05-24 NOTE — ED Triage Notes (Signed)
Pt arrives via EMS from home. Pt was working with physical therapy at home when he started feeling SOB and having generalized weakness. Pt has on demand pacemaker and EMS noticed it was pacing at random rates varying from 50-100. Pt has hx of CHF and CABG

## 2018-05-24 NOTE — ED Notes (Signed)
Opyd, MD paged about report for pacemaker. Pacemaker showed 10 minutes of atrial flutter this morning. Report to be faxed over. Will continue to monitor.

## 2018-05-25 ENCOUNTER — Other Ambulatory Visit: Payer: Self-pay

## 2018-05-25 ENCOUNTER — Inpatient Hospital Stay (HOSPITAL_COMMUNITY): Payer: Medicare HMO

## 2018-05-25 ENCOUNTER — Encounter (HOSPITAL_COMMUNITY): Payer: Self-pay | Admitting: Student

## 2018-05-25 DIAGNOSIS — I10 Essential (primary) hypertension: Secondary | ICD-10-CM

## 2018-05-25 DIAGNOSIS — L8990 Pressure ulcer of unspecified site, unspecified stage: Secondary | ICD-10-CM

## 2018-05-25 HISTORY — PX: IR THORACENTESIS ASP PLEURAL SPACE W/IMG GUIDE: IMG5380

## 2018-05-25 LAB — BASIC METABOLIC PANEL
Anion gap: 10 (ref 5–15)
BUN: 20 mg/dL (ref 8–23)
CALCIUM: 8.6 mg/dL — AB (ref 8.9–10.3)
CO2: 26 mmol/L (ref 22–32)
CREATININE: 1.44 mg/dL — AB (ref 0.61–1.24)
Chloride: 102 mmol/L (ref 98–111)
GFR, EST AFRICAN AMERICAN: 47 mL/min — AB (ref >60)
GFR, EST NON AFRICAN AMERICAN: 41 mL/min — AB (ref >60)
Glucose, Bld: 139 mg/dL — ABNORMAL HIGH (ref 70–99)
Potassium: 4.6 mmol/L (ref 3.5–5.1)
SODIUM: 138 mmol/L (ref 135–145)

## 2018-05-25 LAB — BODY FLUID CELL COUNT WITH DIFFERENTIAL
Lymphs, Fluid: 65 %
Monocyte-Macrophage-Serous Fluid: 27 % — ABNORMAL LOW (ref 50–90)
NEUTROPHIL FLUID: 8 % (ref 0–25)
WBC FLUID: 125 uL (ref 0–1000)

## 2018-05-25 LAB — LACTATE DEHYDROGENASE, PLEURAL OR PERITONEAL FLUID: LD, Fluid: 68 U/L — ABNORMAL HIGH (ref 3–23)

## 2018-05-25 LAB — MRSA PCR SCREENING: MRSA by PCR: NEGATIVE

## 2018-05-25 LAB — HEPATIC FUNCTION PANEL
ALK PHOS: 46 U/L (ref 38–126)
ALT: 35 U/L (ref 0–44)
AST: 35 U/L (ref 15–41)
Albumin: 2.7 g/dL — ABNORMAL LOW (ref 3.5–5.0)
BILIRUBIN DIRECT: 0.2 mg/dL (ref 0.0–0.2)
BILIRUBIN INDIRECT: 0.8 mg/dL (ref 0.3–0.9)
BILIRUBIN TOTAL: 1 mg/dL (ref 0.3–1.2)
Total Protein: 6 g/dL — ABNORMAL LOW (ref 6.5–8.1)

## 2018-05-25 LAB — TROPONIN I
Troponin I: 0.03 ng/mL (ref <0.03)
Troponin I: <0.03 ng/mL (ref <0.03)

## 2018-05-25 LAB — PROTEIN, PLEURAL OR PERITONEAL FLUID

## 2018-05-25 LAB — LACTATE DEHYDROGENASE: LDH: 170 U/L (ref 98–192)

## 2018-05-25 MED ORDER — LIDOCAINE HCL (PF) 2 % IJ SOLN
INTRAMUSCULAR | Status: AC
  Filled 2018-05-25: qty 10

## 2018-05-25 MED ORDER — FUROSEMIDE 10 MG/ML IJ SOLN: 40.0000 mg | Freq: Two times a day (BID) | INTRAMUSCULAR | Status: DC

## 2018-05-25 MED ORDER — AZITHROMYCIN 250 MG PO TABS
250.0000 mg | ORAL_TABLET | Freq: Every day | ORAL | Status: DC
  Administered 2018-05-26 – 2018-05-27 (×2): 250 mg via ORAL
  Filled 2018-05-25 (×2): qty 1

## 2018-05-25 MED ORDER — POLYETHYLENE GLYCOL 3350 17 G PO PACK
17.0000 g | PACK | Freq: Two times a day (BID) | ORAL | Status: DC
  Administered 2018-05-25 – 2018-05-27 (×5): 17 g via ORAL
  Filled 2018-05-25 (×5): qty 1

## 2018-05-25 MED ORDER — FUROSEMIDE 10 MG/ML IJ SOLN
40.0000 mg | Freq: Two times a day (BID) | INTRAMUSCULAR | Status: DC
  Administered 2018-05-25 – 2018-05-27 (×6): 40 mg via INTRAVENOUS
  Filled 2018-05-25 (×6): qty 4

## 2018-05-25 MED ORDER — LIDOCAINE HCL (PF) 2 % IJ SOLN
INTRAMUSCULAR | Status: DC | PRN
  Administered 2018-05-25: 10 mL

## 2018-05-25 MED ORDER — PREDNISONE 20 MG PO TABS
20.0000 mg | ORAL_TABLET | Freq: Every day | ORAL | Status: DC
  Administered 2018-05-26 – 2018-05-27 (×2): 20 mg via ORAL
  Filled 2018-05-25 (×2): qty 1

## 2018-05-25 MED ORDER — LIDOCAINE HCL (PF) 2 % IJ SOLN
INTRAMUSCULAR | Status: AC
  Filled 2018-05-25: qty 20

## 2018-05-25 NOTE — Procedures (Signed)
PROCEDURE SUMMARY:  Successful image-guided right thoracentesis. Yielded 1.5 liters of golden yellow fluid. Patient tolerated procedure well. No immediate complications.  Specimen was sent for labs. CXR ordered.  Joaquim Nam PA-C 05/25/2018 1:04 PM

## 2018-05-25 NOTE — Evaluation (Signed)
Physical Therapy Evaluation Patient Details Name: John Peck MRN: [id number] DOB: [birth year] Today's Date: 05/25/2018   History of Present Illness  Pt is a 84 y/o male admitted secondary to SOB and swelling in LEs. Thought to be secondary to SOB. Chest imaging revealed enlarged R pleural effusion. PMH includes a fib, CAD s/p CABG, CKD, COPD, SSS s/p pacemaker placement, and CVA.   Clinical Impression  Pt admitted secondary to problem above with deficits below. Pt presenting with fatigue, weakness, and decreased balance. Oxygen sats WFL on 2L of oxygen. Required mod A using RW and chair follow for mobility this session. Per pt's daughter, pt requiring +2 assist at home and has been getting weaker. Recommend SNF at d/c, however, pt recently at SNF, so unsure if pt will be able to qualify. If pt unable to go to SNF, will need max HHservices. Will continue to follow acutely to maximize functional mobility independence and safety.     Follow Up Recommendations SNF;Supervision/Assistance - 24 hour    Equipment Recommendations  None recommended by PT    Recommendations for Other Services OT consult     Precautions / Restrictions Precautions Precautions: Fall Restrictions Weight Bearing Restrictions: No      Mobility  Bed Mobility               General bed mobility comments: In chair upon entry.   Transfers Overall transfer level: Needs assistance Equipment used: Rolling walker (2 wheeled) Transfers: Sit to/from Stand Sit to Stand: Mod assist         General transfer comment: Mod A for lift assist and steadying. Increased posterior lean initially and used chair to brace LEs.   Ambulation/Gait Ambulation/Gait assistance: Mod assist;Min assist Gait Distance (Feet): 40 Feet Assistive device: Rolling walker (2 wheeled) Gait Pattern/deviations: Step-through pattern;Decreased stride length;Trunk flexed Gait velocity: Decreased    General Gait Details: Slow, unsteady  gait. Verbal cues for upright posture and proximity to device. Pt reports increased LE fatigue (RLE>LLE), so further distance deferred. Oxygen sats WFL on 2L of oxygen.   Stairs            Wheelchair Mobility    Modified Rankin (Stroke Patients Only)       Balance Overall balance assessment: Needs assistance Sitting-balance support: No upper extremity supported;Feet supported Sitting balance-Leahy Scale: Fair     Standing balance support: Bilateral upper extremity supported;During functional activity Standing balance-Leahy Scale: Poor Standing balance comment: Reliant on BUE support and external support                              Pertinent Vitals/Pain Pain Assessment: Faces Faces Pain Scale: Hurts little more Pain Location: R shoulder  Pain Descriptors / Indicators: Aching Pain Intervention(s): Limited activity within patient's tolerance;Monitored during session;Repositioned    Home Living Family/patient expects to be discharged to:: Private residence Living Arrangements: Spouse/significant other;Children Available Help at Discharge: Family;Available 24 hours/day Type of Home: House Home Access: Level entry     Home Layout: Multi-level;Able to live on main level with bedroom/bathroom Home Equipment: Walker - 4 wheels;Bedside commode;Cane - single point;Shower seat;Walker - 2 wheels;Wheelchair - manual;Hospital bed Additional Comments: Wife not able to physically assist.     Prior Function Level of Independence: Needs assistance   Gait / Transfers Assistance Needed: Daughter reports he has been using WC for mobility, however, has been requiring 2 people to transfer. Has been working with PT on ambulation with  RW.   ADL's / Homemaking Assistance Needed: Needs assist with bathing/dressing and reports he sponge bathes.         Hand Dominance   Dominant Hand: Right    Extremity/Trunk Assessment   Upper Extremity Assessment Upper Extremity  Assessment: Defer to OT evaluation(reports R shoulder pain )    Lower Extremity Assessment Lower Extremity Assessment: RLE deficits/detail;Generalized weakness RLE Deficits / Details: RLE weakness at baseline and reports it buckles. Noted increased fatigue in RLE during gait as well.     Cervical / Trunk Assessment Cervical / Trunk Assessment: Kyphotic  Communication   Communication: HOH  Cognition Arousal/Alertness: Awake/alert Behavior During Therapy: WFL for tasks assessed/performed Overall Cognitive Status: Impaired/Different from baseline Area of Impairment: Memory;Problem solving;Safety/judgement;Awareness                     Memory: Decreased short-term memory;Decreased recall of precautions   Safety/Judgement: Decreased awareness of safety Awareness: Emergent Problem Solving: Slow processing;Requires verbal cues        General Comments General comments (skin integrity, edema, etc.): Pt's daughter present in room and voiced concerns about pt getting weaker at home.     Exercises     Assessment/Plan    PT Assessment Patient needs continued PT services  PT Problem List Decreased strength;Decreased range of motion;Decreased balance;Decreased activity tolerance;Decreased mobility;Decreased cognition;Decreased knowledge of use of DME;Decreased safety awareness;Decreased knowledge of precautions       PT Treatment Interventions DME instruction;Gait training;Functional mobility training;Therapeutic activities;Therapeutic exercise;Balance training;Patient/family education;Wheelchair mobility training;Cognitive remediation    PT Goals (Current goals can be found in the Care Plan section)  Acute Rehab PT Goals Patient Stated Goal: "to be able to walk" PT Goal Formulation: With patient Time For Goal Achievement: 06/08/18 Potential to Achieve Goals: Fair    Frequency Min 3X/week   Barriers to discharge        Co-evaluation               AM-PAC PT "6  Clicks" Daily Activity  Outcome Measure Difficulty turning over in bed (including adjusting bedclothes, sheets and blankets)?: A Lot Difficulty moving from lying on back to sitting on the side of the bed? : Unable Difficulty sitting down on and standing up from a chair with arms (e.g., wheelchair, bedside commode, etc,.)?: Unable Help needed moving to and from a bed to chair (including a wheelchair)?: A Lot Help needed walking in hospital room?: A Lot Help needed climbing 3-5 steps with a railing? : A Lot 6 Click Score: 10    End of Session Equipment Utilized During Treatment: Gait belt;Oxygen Activity Tolerance: Patient limited by fatigue Patient left: in chair;with call bell/phone within reach;with chair alarm set;with nursing/sitter in room;with family/visitor present Nurse Communication: Mobility status PT Visit Diagnosis: Unsteadiness on feet (R26.81);Other abnormalities of gait and mobility (R26.89);Muscle weakness (generalized) (M62.81);Difficulty in walking, not elsewhere classified (R26.2)    Time: 5852-7782 PT Time Calculation (min) (ACUTE ONLY): 28 min   Charges:   PT Evaluation $PT Eval Moderate Complexity: 1 Mod PT Treatments $Gait Training: 8-22 mins   PT G Codes:        Leighton Ruff, PT, DPT  Acute Rehabilitation Services  Pager: [phone]   Rudean Hitt 05/25/2018, 2:03 PM

## 2018-05-25 NOTE — Progress Notes (Signed)
Received post thoracentesis, patient alert , family at bedside. Puncture site covered with band aid, dressing CDI.will monitor.

## 2018-05-25 NOTE — Care Management Note (Signed)
Case Management Note  Patient Details  Name: John Peck MRN: [id number] Date of Birth: 07/04/1924  Subjective/Objective:   CHF                Action/Plan: Noted patient has been admitted x 3 in 6 months; recently admitted to [facility]; PCP: Seward Carol, MD; has private insurance with  [facility] with prescription drug coverage; Physical Therapy eval ordered to assess his functioning level; CM will continue to follow for progression of care.  Expected Discharge Date:    possibly 05/29/2018              Expected Discharge Plan:  [place] vs [facility]  Discharge planning Services  CM Consult  Status of Service:  In process, will continue to follow  Sherrilyn Rist [phone] 05/25/2018, 11:40 AM

## 2018-05-25 NOTE — Progress Notes (Signed)
Visited with patient per scc to assist patient with AD.  Pt daughter  At bedside.supporting father.  Patient and daughter  Wanted to wait until mother was presence to further discuss AD.  AD was left with patient.  Will let nurse when ready to proceed.  Provide guidance and emotional support. Chaplain available as needed.    05/25/18 1022  Clinical Encounter Type  Visited With Patient and family together;Health care provider  Visit Type Initial;Spiritual support  Referral From Nurse  Spiritual Encounters  Spiritual Needs Literature;Prayer;Emotional  Stress Factors  Patient Stress Factors None identified  Family Stress Factors None identified  Advance Directives (For Healthcare)  Does Patient Have a Medical Advance Directive? Yes  Does patient want to make changes to medical advance directive? Yes (Inpatient - patient defers changing a medical advance directive at this time)  Type of Advance Directive [place];Living will     Cristopher Peru, [facility], Pager [phone]

## 2018-05-25 NOTE — Consult Note (Signed)
   [facility] CM Inpatient Consult   05/25/2018  John Peck 1924-04-14 [id number]  Patient screened for potential [place] Management services. Patient is in the [place] of the [place] Management services under patient's [facility] plan. Patient is currently off the unit.  Will follow up  Please place a [place] Management consult or for questions contact:   Natividad Brood, RN BSN [facility] Liaison  [phone] business mobile phone Toll free office [phone]

## 2018-05-25 NOTE — Progress Notes (Signed)
[facility] PROGRESS NOTE    Progress Note  John Peck  TUU:[id number] DOB: 01/08/1924 DOA: 05/24/2018 PCP: Seward Carol, MD     Brief Narrative:   John Peck is an 84 y.o. male past medical history of chronic combined systolic and diastolic heart failure, paroxysmal atrial fibrillation not on anticoagulation due to GI bleed, sick sinus syndrome with pacer, CAD now presenting to the emergency department for evaluation of shortness of breath and swelling.  He was found to be satting at 90% tachypneic and bradycardic  Assessment/Plan:   Acute respiratory failure with hypoxia (HCC) due Acute on chronic combined systolic and diastolic CHF : His blood pressure is 178/74 satting 99% on 3 L admission was 1200 cardiac biomarkers are negative with a chest x-ray showing right-sided pleural effusion and bilateral lower lobes infiltrates. On IV Lasix, monitor strict I's and O's Daily weights.  Recheck replete electrolytes as needed. Strict I's and O's Daily weights. He relates his dyspnea is improved compared to yesterday.  Right-sided pleural effusion: Probably contributing to his dyspnea suspect likely due to acute decompensated heart failure. IR has been consulted for thoracocentesis will send for analysis.  Acute COPD exacerbation: Seen on physical exam on admission, he was started on systemic steroids and antibiotics and inhalers. Not completely sure there is a COPD exacerbation as he only seems to be wheezing unilaterally, this could be cardiac asthma we will decrease the steroids and have a low threshold to DC steroids and antibiotics quickly if the dyspnea resolved within the next 24 hours.  Pancytopenia: Leukopenia is mild as well as his thrombocytopenia, will have to discuss with family will they would want further evaluation.  Chronic kidney disease stage III: Creatinine at 1.5  Essential hypertension: Blood pressure is currently high continue current regimen he  was taken at home.  PAF (paroxysmal atrial fibrillation) (HCC) Rate controlled not on anticoagulation due to GI bleed.  CAD in native artery: Asymptomatic continue current medications.  Sick sinus syndrome (HCC) Sinus rhythm  Pressure injury of skin  DVT prophylaxis: lovenox Family Communication:daughter Disposition Plan/Barrier to D/C: None Code Status:     Code Status Orders  (From admission, onward)        Start     Ordered   05/24/18 1954  Full code  Continuous     05/24/18 1956    Code Status History    Date Active Date Inactive Code Status Order ID Comments User Context   03/14/2018 1500 03/19/2018 1917 Full Code [id number]  Samella Parr, NP ED   02/22/2018 0315 02/24/2018 2301 Full Code [id number]  Ivor Costa, MD ED   12/15/2017 1354 12/15/2017 1924 Full Code [id number]  Evans Lance, MD Inpatient   02/23/2017 1438 02/26/2017 1811 Full Code [id number]  Delos Haring, PA-C ED   11/29/2015 1545 11/30/2015 1531 Full Code [id number]  Burnell Blanks, MD Inpatient   11/28/2015 0229 11/29/2015 1545 Full Code [id number]  Toy Baker, MD Inpatient   05/09/2015 2146 05/11/2015 1611 Full Code [id number]  Ivor Costa, MD Inpatient   01/26/2014 0942 01/26/2014 1715 Full Code [id number]  Troy Sine, MD Inpatient        IV Access:    Peripheral IV   Procedures and diagnostic studies:   Dg Chest Port 1 View  Result Date: 05/24/2018 CLINICAL DATA:  SOB Pt was working with physical therapy at home when he started feeling SOB and having generalized weakness. Pt has on demand pacemaker and EMS noticed it was  pacing at random rates varying from 50-100. Pt has hx of CHF and CABG No chest pain EXAM: PORTABLE CHEST 1 VIEW COMPARISON:  04/15/2018 FINDINGS: Moderate to large right pleural effusion. Right basilar airspace opacity compatible with atelectasis or pneumonia. Mildly confluent airspace opacity at the right lung apex, roughly similar to prior. Bilateral interstitial  accentuation with mild cardiomegaly and prior median sternotomy and CABG. Minimal blunting of the left lateral costophrenic angle with indistinct airspace opacity at left lung base. Dual lead pacer in place. IMPRESSION: 1. Enlarged right pleural effusion with stable right apical airspace opacity and increased bibasilar airspace opacities potentially from edema or multifocal pneumonia. 2. Small left pleural effusion. 3. Mild to moderate enlargement of the cardiopericardial silhouette with prior CABG. Electronically Signed   By: Van Clines M.D.   On: 05/24/2018 18:02     Medical Consultants:    None.  Anti-Infectives:   Azithro  Subjective:    John Peck he relates his breathing is improved compared to yesterday. Objective:    Vitals:   05/24/18 2015 05/24/18 2122 05/24/18 2340 05/25/18 0422  BP: 121/78 (!) 143/86 (!) 178/74 (!) 160/78  Pulse: 62 65  62  Resp: 16 18  20   Temp:  (!) 97.5 F (36.4 C) (!) 97.5 F (36.4 C) 97.8 F (36.6 C)  TempSrc:  Oral Oral Oral  SpO2: 96% 99% 100% 99%  Weight:  75.4 kg (166 lb 3.6 oz)  75.1 kg (165 lb 9.1 oz)  Height:  5\' 9"  (1.753 m)      Intake/Output Summary (Last 24 hours) at 05/25/2018 0708 Last data filed at 05/25/2018 0648 Gross per 24 hour  Intake -  Output 1425 ml  Net -1425 ml   Filed Weights   05/24/18 2122 05/25/18 0422  Weight: 75.4 kg (166 lb 3.6 oz) 75.1 kg (165 lb 9.1 oz)    Exam: General exam: In no acute distress. Respiratory system: Good air movement and clear to auscultation. Cardiovascular system: S1 & S2 heard, RRR. + JVD Gastrointestinal system: Abdomen is nondistended, soft and nontender.  Central nervous system: Alert and oriented. No focal neurological deficits. Extremities: 2+ lower extremity edema Skin: Scrotal swelling. Psychiatry: Judgement and insight appear normal. Mood & affect appropriate.    Data Reviewed:    Labs: Basic Metabolic Panel: Recent Labs  Lab 05/24/18 1712  05/25/18 0454  NA 134* 138  K 4.2 4.6  CL 100 102  CO2 26 26  GLUCOSE 130* 139*  BUN 20 20  CREATININE 1.56* 1.44*  CALCIUM 8.4* 8.6*   GFR Estimated Creatinine Clearance: 32.7 mL/min (A) (by C-G formula based on SCr of 1.44 mg/dL (H)). Liver Function Tests: Recent Labs  Lab 05/25/18 0454  AST 35  ALT 35  ALKPHOS 46  BILITOT 1.0  PROT 6.0*  ALBUMIN 2.7*   No results for input(s): LIPASE, AMYLASE in the last 168 hours. No results for input(s): AMMONIA in the last 168 hours. Coagulation profile No results for input(s): INR, PROTIME in the last 168 hours.  CBC: Recent Labs  Lab 05/24/18 1712  WBC 3.6*  HGB 10.0*  HCT 31.1*  MCV 98.4  PLT 139*   Cardiac Enzymes: Recent Labs  Lab 05/24/18 1712 05/24/18 2235 05/25/18 0454  TROPONINI 0.03* 0.03* <0.03   BNP (last 3 results) No results for input(s): PROBNP in the last 8760 hours. CBG: No results for input(s): GLUCAP in the last 168 hours. D-Dimer: No results for input(s): DDIMER in the last 72 hours.  Hgb A1c: No results for input(s): HGBA1C in the last 72 hours. Lipid Profile: No results for input(s): CHOL, HDL, LDLCALC, TRIG, CHOLHDL, LDLDIRECT in the last 72 hours. Thyroid function studies: No results for input(s): TSH, T4TOTAL, T3FREE, THYROIDAB in the last 72 hours.  Invalid input(s): FREET3 Anemia work up: No results for input(s): VITAMINB12, FOLATE, FERRITIN, TIBC, IRON, RETICCTPCT in the last 72 hours. Sepsis Labs: Recent Labs  Lab 05/24/18 1712  WBC 3.6*   Microbiology No results found for this or any previous visit (from the past 240 hour(s)).   Medications:   . aspirin EC  81 mg Oral Daily  . atorvastatin  40 mg Oral q1800  . carvedilol  25 mg Oral BID  . cholecalciferol  1,000 Units Oral Daily  . clopidogrel  75 mg Oral Q1500  . ezetimibe  10 mg Oral Daily  . furosemide  40 mg Intravenous BID  . ipratropium-albuterol  3 mL Nebulization BID  . isosorbide mononitrate  30 mg Oral QHS    . isosorbide mononitrate  90 mg Oral Daily  . [START ON 05/26/2018] latanoprost  1 drop Both Eyes Once per day on Mon Thu  . levothyroxine  125 mcg Oral QAC breakfast  . methylPREDNISolone (SOLU-MEDROL) injection  60 mg Intravenous Q6H  . pantoprazole  40 mg Oral BID  . ranolazine  1,000 mg Oral BID  . sodium chloride flush  3 mL Intravenous Q12H  . tamsulosin  0.4 mg Oral QPC supper   Continuous Infusions: . sodium chloride 250 mL (05/24/18 2346)  . azithromycin 500 mg (05/25/18 0235)     LOS: 1 day   Charlynne Cousins  [facility] Pager [phone]  *Please refer to [email or web address], password [email or web address] to get updated schedule on who will round on this patient, as hospitalists switch teams weekly. If 7PM-7AM, please contact night-coverage at [email or web address], password [email or web address] for any overnight needs.  05/25/2018, 7:08 AM

## 2018-05-26 ENCOUNTER — Telehealth: Payer: Self-pay | Admitting: Cardiovascular Disease

## 2018-05-26 DIAGNOSIS — I5043 Acute on chronic combined systolic (congestive) and diastolic (congestive) heart failure: Secondary | ICD-10-CM

## 2018-05-26 LAB — BASIC METABOLIC PANEL
Anion gap: 9 (ref 5–15)
BUN: 27 mg/dL — AB (ref 8–23)
CALCIUM: 8.2 mg/dL — AB (ref 8.9–10.3)
CO2: 28 mmol/L (ref 22–32)
CREATININE: 1.54 mg/dL — AB (ref 0.61–1.24)
Chloride: 101 mmol/L (ref 98–111)
GFR calc non Af Amer: 37 mL/min — ABNORMAL LOW (ref >60)
GFR, EST AFRICAN AMERICAN: 43 mL/min — AB (ref >60)
GLUCOSE: 130 mg/dL — AB (ref 70–99)
Potassium: 4 mmol/L (ref 3.5–5.1)
Sodium: 138 mmol/L (ref 135–145)

## 2018-05-26 LAB — GRAM STAIN

## 2018-05-26 MED ORDER — CARVEDILOL 12.5 MG PO TABS
12.5000 mg | ORAL_TABLET | Freq: Two times a day (BID) | ORAL | Status: DC
  Administered 2018-05-26 – 2018-05-27 (×3): 12.5 mg via ORAL
  Filled 2018-05-26 (×3): qty 1

## 2018-05-26 MED ORDER — PANTOPRAZOLE SODIUM 40 MG PO TBEC
40.0000 mg | DELAYED_RELEASE_TABLET | Freq: Every day | ORAL | Status: DC
  Administered 2018-05-27: 40 mg via ORAL
  Filled 2018-05-26: qty 1

## 2018-05-26 MED ORDER — CARVEDILOL 6.25 MG PO TABS: 6.2500 mg | ORAL_TABLET | Freq: Two times a day (BID) | ORAL | Status: DC

## 2018-05-26 NOTE — Telephone Encounter (Signed)
Returned call to daughter and explained that patient should not be receiving both metoprolol and carvedilol. Metoprolol was started in May 2019 when carvedilol was stopped after hospitalization. She is concerned patient may have been taking both meds all along. Notified her that when patient is discharged from hospital, they should use this list to prepare his meds by, as it will be the most up to date.

## 2018-05-26 NOTE — Telephone Encounter (Signed)
New Message    Pt c/o medication issue:  1. Name of Medication: carvedilol (COREG) tablet 12.5 mg and Metoprolol Tartrate 75 MG TABS    2. How are you currently taking this medication (dosage and times per day)?   3. Are you having a reaction (difficulty breathing--STAT)?   4. What is your medication issue? Patients dtr Dorian Pod is calling on his behalf. The patient is currently at the hospital and the hospital is trying to confirm whether or not the patient should be taking both of these medications. Please call to discuss.

## 2018-05-26 NOTE — Consult Note (Signed)
   [facility] CM Inpatient Consult   05/26/2018  John Peck 12-29-1923 [id number]  Follow up:  Patient with high unplanned readmission score [27%] and has had 3 hospitalizations in the past 6 months. Went by to speak with patient at the bedside.  Patient setting up in chair but not fully oriented.  Chart review reveals that the patient's family is pursuing a skilled nursing facility disposition.  Left contact information with 24 hour nurse line at the bedside.  Natividad Brood, RN BSN [facility] Liaison  [phone] business mobile phone Toll free office [phone]

## 2018-05-26 NOTE — Clinical Social Work Note (Addendum)
Per MD, patient might discharge tomorrow. [facility] admissions coordinator will start insurance authorization. CSW left voicemail for patient's daughter.  Dayton Scrape, [place] [phone]  5:06 pm CSW had family meeting with patient's wife, two daughters, and granddaughter. Discussed SNF placement and how insurance covers. They stated when patient left [facility], he went to [facility] until 6/4. When patient discharges to SNF this time, he will start out in copay days. They are agreeable to paying copay for short period of time. Discussed applying for Medicaid and potentially going to ALF after SNF. CSW provided patient's family with list of local ALF's. CSW called Assisted Living Locator, Denyce Robert. She stated they only do placement for private pay or long-term insurance patients. She did provide CSW with contacts for two [facility] resources through [employer] who can assist with Medicaid placement. Their phone number was provided on the ALF list. Family also potentially interested in paying for private caregivers at home.  Dayton Scrape, [place]

## 2018-05-26 NOTE — Clinical Social Work Placement (Signed)
   CLINICAL SOCIAL WORK PLACEMENT  NOTE  Date:  05/26/2018  Patient Details  Name: John Peck MRN: [id number] Date of Birth: 03-Jun-1924  Clinical Social Work is seeking post-discharge placement for this patient at the [place] level of care (*CSW will initial, date and re-position this form in  chart as items are completed):  Yes   Patient/family provided with [place] Work Department's list of facilities offering this level of care within the geographic area requested by the patient (or if unable, by the patient's family).  Yes   Patient/family informed of their freedom to choose among providers that offer the needed level of care, that participate in Medicare, Medicaid or managed care program needed by the patient, have an available bed and are willing to accept the patient.  Yes   Patient/family informed of 's ownership interest in [facility] and [facility], as well as of the fact that they are under no obligation to receive care at these facilities.  PASRR submitted to EDS on 05/26/18     PASRR number received on       Existing PASRR number confirmed on 05/26/18     FL2 transmitted to all facilities in geographic area requested by pt/family on 05/26/18     FL2 transmitted to all facilities within larger geographic area on       Patient informed that his/her managed care company has contracts with or will negotiate with certain facilities, including the following:            Patient/family informed of bed offers received.  Patient chooses bed at       Physician recommends and patient chooses bed at      Patient to be transferred to   on  .  Patient to be transferred to facility by       Patient family notified on   of transfer.  Name of family member notified:        PHYSICIAN Please sign FL2     Additional Comment:    _______________________________________________ Candie Chroman, LCSW 05/26/2018,  10:17 AM

## 2018-05-26 NOTE — Progress Notes (Signed)
Physical Therapy Treatment Patient Details Name: John Peck MRN: [id number] DOB: 1924/11/03 Today's Date: 05/26/2018    History of Present Illness Pt is a 84 y/o male admitted secondary to SOB and edema with Rt pleural effusion. PMH includes a fib, CAD s/p CABG, CKD, COPD, SSS s/p pacemaker placement, and CVA.     PT Comments    Pt pleasant and willing to mobilize. Pt with slightly increased gait tolerance today with ability to maintain SpO2 >90% on RA. Pt educated for transfers, progressing gait and HEP. Will continue to follow and encouraged mobility with nursing.   HR 63-69, SpO2 90-99% on RA    Follow Up Recommendations  SNF;Supervision/Assistance - 24 hour     Equipment Recommendations  None recommended by PT    Recommendations for Other Services       Precautions / Restrictions Precautions Precautions: Fall Restrictions Weight Bearing Restrictions: No    Mobility  Bed Mobility Overal bed mobility: Needs Assistance Bed Mobility: Supine to Sit     Supine to sit: Min assist;HOB elevated     General bed mobility comments: min assist to fully elevate trunk with HOB 40degrees  Transfers Overall transfer level: Needs assistance   Transfers: Sit to/from Stand Sit to Stand: Min assist         General transfer comment: min assist to stand with cues for hand placement from bed and chair  Ambulation/Gait Ambulation/Gait assistance: Min assist;+2 safety/equipment Gait Distance (Feet): 50 Feet Assistive device: Rolling walker (2 wheeled) Gait Pattern/deviations: Step-through pattern;Decreased stride length;Trunk flexed   Gait velocity interpretation: <1.8 ft/sec, indicate of risk for recurrent falls General Gait Details: cues for posture, position in RW and chair to follow with pt able to walk 50' x 2 with seated rest between trials. Pt with SpO2 >90% on RA throughout   Stairs             Wheelchair Mobility    Modified Rankin (Stroke Patients  Only)       Balance Overall balance assessment: Needs assistance Sitting-balance support: No upper extremity supported;Feet supported Sitting balance-Leahy Scale: Fair     Standing balance support: Bilateral upper extremity supported;During functional activity Standing balance-Leahy Scale: Poor                              Cognition Arousal/Alertness: Awake/alert Behavior During Therapy: WFL for tasks assessed/performed Overall Cognitive Status: Impaired/Different from baseline                       Memory: Decreased short-term memory       Problem Solving: Slow processing        Exercises General Exercises - Lower Extremity Long Arc Quad: AROM;10 reps;Seated;Both Hip Flexion/Marching: AROM;10 reps;Seated;Both    General Comments        Pertinent Vitals/Pain Faces Pain Scale: Hurts a little bit Pain Location: ankle Pain Descriptors / Indicators: Sore Pain Intervention(s): Limited activity within patient's tolerance;Repositioned;Monitored during session    Home Living                      Prior Function            PT Goals (current goals can now be found in the care plan section) Progress towards PT goals: Progressing toward goals    Frequency    Min 3X/week      PT Plan Current plan remains appropriate  Co-evaluation              AM-PAC PT "6 Clicks" Daily Activity  Outcome Measure  Difficulty turning over in bed (including adjusting bedclothes, sheets and blankets)?: A Little Difficulty moving from lying on back to sitting on the side of the bed? : Unable Difficulty sitting down on and standing up from a chair with arms (e.g., wheelchair, bedside commode, etc,.)?: Unable Help needed moving to and from a bed to chair (including a wheelchair)?: A Little Help needed walking in hospital room?: A Little Help needed climbing 3-5 steps with a railing? : A Lot 6 Click Score: 13    End of Session Equipment  Utilized During Treatment: Gait belt Activity Tolerance: Patient tolerated treatment well Patient left: in chair;with call bell/phone within reach;with chair alarm set Nurse Communication: Mobility status PT Visit Diagnosis: Unsteadiness on feet (R26.81);Other abnormalities of gait and mobility (R26.89);Muscle weakness (generalized) (M62.81);Difficulty in walking, not elsewhere classified (R26.2)     Time: 8768-1157 PT Time Calculation (min) (ACUTE ONLY): 21 min  Charges:  $Gait Training: 8-22 mins                    G Codes:       Elwyn Reach, PT [phone]    [place] 05/26/2018, 10:28 AM

## 2018-05-26 NOTE — Progress Notes (Signed)
Patient Demographics:    John Peck, is a 84 y.o. male, DOB - 09-04-1924, QIW:[id number]  Admit date - 05/24/2018   Admitting Physician Vianne Bulls, MD  Outpatient Primary MD for the patient is Seward Carol, MD  LOS - 2   Chief Complaint  Patient presents with  . Shortness of Breath        Subjective:    John Peck today has no fevers, no emesis,  No chest pain, wife and granddaughter at bedside, questions answered, ambulated with physical therapist, no hypoxia, no shortness of breath at rest, some dyspnea on exertion noted  Assessment  & Plan :    Principal Problem:   Acute on chronic combined systolic and diastolic CHF (congestive heart failure) ([place]) Active Problems:   Essential hypertension   PAF (paroxysmal atrial fibrillation) (HCC)   CAD in native artery   Sick sinus syndrome (HCC)   Pancytopenia (HCC)   COPD clinical dx/ no pfts on record   CKD (chronic kidney disease), stage III (HCC)   Hypothyroidism   Pleural effusion   Acute respiratory failure with hypoxia (HCC)   Pressure injury of skin  Brief summary:- John Peck is an 84 y.o. male past medical history of chronic combined systolic and diastolic heart failure, paroxysmal atrial fibrillation not on anticoagulation due to GI bleed, sick sinus syndrome with pacer, CAD now presenting to the emergency department for evaluation of shortness of breath and swelling.  He was found to be satting at 90% tachypneic and bradycardic, status post Rt thoracentesis on 05/25/2018 with removal of 1.5 L of fluid   Plan:-  1)HFrEF--- acute on chronic combined systolic and diastolic dysfunction CHF exacerbation, improving with diuresis and with right-sided thoracentesis, hypoxia is resolved, continue Lasix 40 mg IV twice daily, Coreg 12.25 mg twice daily, daily weight, fluid input and output recording, monitor renal function closely  with diuresis  2)Rt sided pleural effusion----respiratory status improved significantly after thoracentesis on 05/25/2018 with removal of 1.5 L of fluid, fluid studies are pending, suspect CHF related effusion,  3) acute COPD exacerbation--currently on azithromycin and prednisone, use bronchodilators judiciously, plan to discontinue prednisone in 1 to 2 days.  Hypoxia has resolved as noted above #1  4) CKD stage III-    Creatinine is 1.5 which is close to baseline,,  Avoid nephrotoxic agents/dehydration/hypotension  5)PAF--- stable, continue Coreg for rate control, not on anticoagulation due to history of GI bleed  6)Pancytopenia-- ???  Etiology, continue to monitor closely may need outpatient hematology work-up  7) coronary artery disease/status post prior CABG--no frank ACS type symptoms at this time, continue aspirin, Plavix, Coreg 12.5 mg twice daily, as well as isosorbide 90 mg every morning and 30 mg every afternoon, c/n ranexa and atorvastatin and zetia  8) hypothyroidism--stable, continue levothyroxine 125 mcg daily  9)Disposition--- patient has generalized weakness and debility, some dyspnea on exertion, physical therapy evaluation appreciated, patient will most likely need skilled nursing facility rehab prior to returning home  Code Status : Full code  Disposition Plan  :  # 9 above  Consults  : IR for right-sided thoracentesis  DVT Prophylaxis  :   - SCDs (pancytopenia)  Lab Results  Component Value Date   PLT 139 (L) 05/24/2018  Inpatient Medications  Scheduled Meds: . aspirin EC  81 mg Oral Daily  . atorvastatin  40 mg Oral q1800  . azithromycin  250 mg Oral Daily  . carvedilol  12.5 mg Oral BID WC  . cholecalciferol  1,000 Units Oral Daily  . clopidogrel  75 mg Oral Q1500  . ezetimibe  10 mg Oral Daily  . furosemide  40 mg Intravenous BID  . ipratropium-albuterol  3 mL Nebulization BID  . isosorbide mononitrate  30 mg Oral QHS  . isosorbide mononitrate  90  mg Oral Daily  . latanoprost  1 drop Both Eyes Once per day on Mon Thu  . levothyroxine  125 mcg Oral QAC breakfast  . [START ON 05/27/2018] pantoprazole  40 mg Oral Daily  . polyethylene glycol  17 g Oral BID  . predniSONE  20 mg Oral Q breakfast  . ranolazine  1,000 mg Oral BID  . sodium chloride flush  3 mL Intravenous Q12H  . tamsulosin  0.4 mg Oral QPC supper   Continuous Infusions: . sodium chloride 250 mL (05/24/18 2346)   PRN Meds:.sodium chloride, acetaminophen, albuterol, ALPRAZolam, lidocaine, ondansetron (ZOFRAN) IV, sodium chloride flush    Anti-infectives (From admission, onward)   Start     Dose/Rate Route Frequency Ordered Stop   05/26/18 1000  azithromycin (ZITHROMAX) tablet 250 mg     250 mg Oral Daily 05/25/18 0758     05/24/18 2200  azithromycin (ZITHROMAX) 500 mg in sodium chloride 0.9 % 250 mL IVPB  Status:  Discontinued     500 mg 250 mL/hr over 60 Minutes Intravenous Every 24 hours 05/24/18 1956 05/25/18 0749        Objective:   Vitals:   05/25/18 2338 05/26/18 0438 05/26/18 0934 05/26/18 1116  BP: (!) 113/55 (!) 115/58 (!) 114/55 (!) 120/59  Pulse: (!) 59 (!) 59 64 61  Resp: 16 18  18   Temp: 98.1 F (36.7 C) 98.1 F (36.7 C)  98.2 F (36.8 C)  TempSrc: Oral Oral  Oral  SpO2: 98% 100% 94% 94%  Weight:  72.4 kg (159 lb 9.6 oz)    Height:        Wt Readings from Last 3 Encounters:  05/26/18 72.4 kg (159 lb 9.6 oz)  05/03/18 75.2 kg (165 lb 12.8 oz)  05/02/18 75 kg (165 lb 6.4 oz)     Intake/Output Summary (Last 24 hours) at 05/26/2018 1448 Last data filed at 05/26/2018 1345 Gross per 24 hour  Intake 1203 ml  Output 2000 ml  Net -797 ml     Physical Exam  Gen:- Awake Alert,  In no apparent distress  HEENT:- [place].AT, No sclera icterus Ears- HOH Neck-Supple Neck,No JVD,.  Lungs-diminished in bases, no wheezing CV- S1, S2 normal, irregular, CABG scar, left subclavian area pacemaker in situ Abd-  +ve B.Sounds, Abd Soft, No tenderness,      Extremity/Skin:- trace  edema,   good pulses Psych-affect is appropriate, oriented x3 Neuro-no new focal deficits, no tremors   Data Review:   Micro Results Recent Results (from the past 240 hour(s))  MRSA PCR Screening     Status: None   Collection Time: 05/25/18  8:12 AM  Result Value Ref Range Status   MRSA by PCR NEGATIVE NEGATIVE Final    Comment:        The GeneXpert MRSA Assay (FDA approved for NASAL specimens only), is one component of a comprehensive MRSA colonization surveillance program. It is not intended to diagnose  MRSA infection nor to guide or monitor treatment for MRSA infections. Performed at [facility], [place] [address], [place], [place] [address]   Gram stain     Status: None   Collection Time: 05/25/18  1:02 PM  Result Value Ref Range Status   Specimen Description PLEURAL RIGHT  Final   Special Requests NONE  Final   Gram Stain   Final    FEW WBC PRESENT, PREDOMINANTLY MONONUCLEAR NO ORGANISMS SEEN Performed at [facility], [address] [place], [place] [address]    Report Status 05/26/2018 FINAL  Final    Radiology Reports Dg Chest 1 View  Result Date: 05/25/2018 CLINICAL DATA:  Status post right-sided thoracentesis. EXAM: CHEST  1 VIEW COMPARISON:  May 24, 2018 and Mar 18, 2018 FINDINGS: No pneumothorax. Right pleural effusion smaller following thoracentesis. Small right pleural effusion remains. There is stable scarring in the upper lobes, more on the right than on the left. There is a minimal left pleural effusion with scarring in the left base. There is no frank airspace consolidation. Heart is upper normal in size with pulmonary vascularity normal. Pacemaker leads are attached to the right atrium and right ventricle. Patient is status post coronary artery bypass grafting. Prominence in the right paratracheal region is stable and felt to represent great vessel prominence in this age group. No bone lesions evident. IMPRESSION: Right  pleural effusion smaller following thoracentesis. No pneumothorax. Fairly small right pleural effusion remains. Areas of scarring in the upper lobes and left base remain. There is a minimal left pleural effusion. Stable cardiac silhouette. Electronically Signed   By: Lowella Grip III M.D.   On: 05/25/2018 13:25   Dg Chest Port 1 View  Result Date: 05/24/2018 CLINICAL DATA:  SOB Pt was working with physical therapy at home when he started feeling SOB and having generalized weakness. Pt has on demand pacemaker and EMS noticed it was pacing at random rates varying from 50-100. Pt has hx of CHF and CABG No chest pain EXAM: PORTABLE CHEST 1 VIEW COMPARISON:  04/15/2018 FINDINGS: Moderate to large right pleural effusion. Right basilar airspace opacity compatible with atelectasis or pneumonia. Mildly confluent airspace opacity at the right lung apex, roughly similar to prior. Bilateral interstitial accentuation with mild cardiomegaly and prior median sternotomy and CABG. Minimal blunting of the left lateral costophrenic angle with indistinct airspace opacity at left lung base. Dual lead pacer in place. IMPRESSION: 1. Enlarged right pleural effusion with stable right apical airspace opacity and increased bibasilar airspace opacities potentially from edema or multifocal pneumonia. 2. Small left pleural effusion. 3. Mild to moderate enlargement of the cardiopericardial silhouette with prior CABG. Electronically Signed   By: Van Clines M.D.   On: 05/24/2018 18:02   Ir Thoracentesis Asp Pleural Space W/img Guide  Result Date: 05/25/2018 INDICATION: Symptomatic right pleural effusion - request for diagnostic thoracentesis EXAM: ULTRASOUND GUIDED RIGHT THORACENTESIS MEDICATIONS: 13 mL 2% lidocaine COMPLICATIONS: None immediate. PROCEDURE: An ultrasound guided thoracentesis was thoroughly discussed with the patient and questions answered. The benefits, risks, alternatives and complications were also discussed.  The patient understands and wishes to proceed with the procedure. Written consent was obtained. Ultrasound was performed to localize and mark an adequate pocket of fluid in the right chest. The area was then prepped and draped in the normal sterile fashion. 2% Lidocaine was used for local anesthesia. Under ultrasound guidance a 6 Fr Safe-T-Centesis catheter was introduced. Thoracentesis was performed. The catheter was removed and  a dressing applied. FINDINGS: A total of approximately 1.5L of golden yellow fluid was removed. Samples were sent to the laboratory as requested by the clinical team. IMPRESSION: Successful ultrasound guided right thoracentesis yielding 1.5L of pleural fluid. Read by Candiss Norse, PA-C Electronically Signed   By: Jacqulynn Cadet M.D.   On: 05/25/2018 13:31     CBC Recent Labs  Lab 05/24/18 1712  WBC 3.6*  HGB 10.0*  HCT 31.1*  PLT 139*  MCV 98.4  MCH 31.6  MCHC 32.2  RDW 16.6*    Chemistries  Recent Labs  Lab 05/24/18 1712 05/25/18 0454 05/26/18 0427  NA 134* 138 138  K 4.2 4.6 4.0  CL 100 102 101  CO2 26 26 28   GLUCOSE 130* 139* 130*  BUN 20 20 27*  CREATININE 1.56* 1.44* 1.54*  CALCIUM 8.4* 8.6* 8.2*  AST  --  35  --   ALT  --  35  --   ALKPHOS  --  46  --   BILITOT  --  1.0  --    ------------------------------------------------------------------------------------------------------------------ No results for input(s): CHOL, HDL, LDLCALC, TRIG, CHOLHDL, LDLDIRECT in the last 72 hours.  Lab Results  Component Value Date   HGBA1C 5.5 02/23/2018   ------------------------------------------------------------------------------------------------------------------ No results for input(s): TSH, T4TOTAL, T3FREE, THYROIDAB in the last 72 hours.  Invalid input(s): FREET3 ------------------------------------------------------------------------------------------------------------------ No results for input(s): VITAMINB12, FOLATE, FERRITIN, TIBC,  IRON, RETICCTPCT in the last 72 hours.  Coagulation profile No results for input(s): INR, PROTIME in the last 168 hours.  No results for input(s): DDIMER in the last 72 hours.  Cardiac Enzymes Recent Labs  Lab 05/24/18 2235 05/25/18 0454 05/25/18 1056  TROPONINI 0.03* <0.03 <0.03   ------------------------------------------------------------------------------------------------------------------    Component Value Date/Time   BNP 1,210.4 (H) 05/24/2018 1712     Roxan Hockey M.D on 05/26/2018 at 2:48 PM   Go to [email or web address] - password [email or web address] for contact info  [facility] - Office  [phone]

## 2018-05-26 NOTE — NC FL2 (Signed)
[place] LEVEL OF CARE SCREENING TOOL     IDENTIFICATION  Patient Name: John Peck Birthdate: 1924-03-29 Sex: male Admission Date (Current Location): 05/24/2018  [facility] and [place] Number:  Herbalist and Address:  [facility], [place] [address] [place], [place] [address]      Provider Number: [id number]  Attending Physician Name and Address:  Roxan Hockey, MD  Relative Name and Phone Number:       Current Level of Care: Hospital Recommended Level of Care: [place] Prior Approval Number:    Date Approved/Denied:   PASRR Number: [id number]  Discharge Plan: SNF    Current Diagnoses: Patient Active Problem List   Diagnosis Date Noted  . Pressure injury of skin 05/25/2018  . Pleural effusion 05/24/2018  . Acute respiratory failure with hypoxia ([place]) 05/24/2018  . Acute on chronic combined systolic and diastolic CHF (congestive heart failure) ([place]) 03/14/2018  . COPD clinical dx/ no pfts on record 03/14/2018  . H/O: GI bleed 03/14/2018  . CKD (chronic kidney disease), stage III ([place]) 03/14/2018  . Paroxysmal atrial fibrillation ([place]) 03/14/2018  . Hypothyroidism 03/14/2018  . Carotid stenosis, right 03/14/2018  . Facial droop 02/22/2018  . Pancytopenia ([place]) 02/22/2018  . Generalized weakness   . Hypertensive heart disease   . Hyperlipidemia   . Stented coronary artery   . Coronary artery disease involving native coronary artery of native heart with unstable angina pectoris ([place])   . Reactive airway disease 11/27/2015  . CAD in native artery 09/03/2015  . Weakness 05/09/2015  . Hyperlipidemia LDL goal <70 02/28/2015  . Atherosclerosis of lower extremity with claudication ([place]) 07/17/2014  . PAF (paroxysmal atrial fibrillation) ([place]) 03/24/2013  . Lower extremity edema 03/24/2013  . Sinoatrial node dysfunction ([place]) 08/17/2012  . History of iron deficiency anemia 09/18/2011  . Essential  hypertension 07/07/2009  . INTERMEDIATE CORONARY SYNDROME 07/07/2009  . S/P CABG x 2 '89. RCA stent'04. last cath Jan 2013 07/07/2009  . PACEMAKER, PERMANENT- St Jude Sept 2009 07/07/2009  . Sick sinus syndrome ([place]) 07/10/2008  . Pacemaker 07/10/2008    Orientation RESPIRATION BLADDER Height & Weight     Self, Place    Incontinent Weight: 159 lb 9.6 oz (72.4 kg) Height:  5\' 9"  (175.3 cm)  BEHAVIORAL SYMPTOMS/MOOD NEUROLOGICAL BOWEL NUTRITION STATUS  (None) (None) Continent Diet(2 gram sodium. Fluid restriction 1500 mL.)  AMBULATORY STATUS COMMUNICATION OF NEEDS Skin   Limited Assist Verbally PU Stage and Appropriate Care   PU Stage 2 Dressing: (Left buttocks: Foam prn.)                   Personal Care Assistance Level of Assistance  Bathing, Feeding, Dressing Bathing Assistance: Limited assistance Feeding assistance: Limited assistance Dressing Assistance: Limited assistance     Functional Limitations Info  Sight, Hearing, Speech Sight Info: Adequate Hearing Info: Adequate Speech Info: Adequate    SPECIAL CARE FACTORS FREQUENCY  PT (By licensed PT), Blood pressure, OT (By licensed OT)     PT Frequency: 5 x week OT Frequency: 5 x week            Contractures Contractures Info: Not present    Additional Factors Info  Code Status, Allergies Code Status Info: Full code Allergies Info: NKDA           Current Medications (05/26/2018):  This is the current hospital active medication list Current Facility-Administered Medications  Medication Dose Route Frequency Provider Last  Rate Last Dose  . 0.9 %  sodium chloride infusion  250 mL Intravenous PRN Opyd, Ilene Qua, MD 10 mL/hr at 05/24/18 2346 250 mL at 05/24/18 2346  . acetaminophen (TYLENOL) tablet 650 mg  650 mg Oral Q4H PRN Opyd, Ilene Qua, MD      . albuterol (PROVENTIL) (2.5 MG/3ML) 0.083% nebulizer solution 2.5 mg  2.5 mg Nebulization Q4H PRN Opyd, Ilene Qua, MD      . ALPRAZolam Duanne Moron) tablet 0.25 mg   0.25 mg Oral BID PRN Opyd, Ilene Qua, MD      . aspirin EC tablet 81 mg  81 mg Oral Daily Opyd, Ilene Qua, MD   81 mg at 05/26/18 0938  . atorvastatin (LIPITOR) tablet 40 mg  40 mg Oral q1800 Opyd, Ilene Qua, MD   40 mg at 05/25/18 1706  . azithromycin (ZITHROMAX) tablet 250 mg  250 mg Oral Daily Charlynne Cousins, MD   250 mg at 05/26/18 [id number]  . carvedilol (COREG) tablet 25 mg  25 mg Oral BID Vianne Bulls, MD   25 mg at 05/26/18 0938  . cholecalciferol (VITAMIN D) tablet 1,000 Units  1,000 Units Oral Daily Opyd, Ilene Qua, MD   1,000 Units at 05/26/18 [phone]  . clopidogrel (PLAVIX) tablet 75 mg  75 mg Oral Q1500 Opyd, Ilene Qua, MD   75 mg at 05/25/18 1706  . ezetimibe (ZETIA) tablet 10 mg  10 mg Oral Daily Opyd, Ilene Qua, MD   10 mg at 05/26/18 0938  . furosemide (LASIX) injection 40 mg  40 mg Intravenous BID Charlynne Cousins, MD   40 mg at 05/26/18 0937  . ipratropium-albuterol (DUONEB) 0.5-2.5 (3) MG/3ML nebulizer solution 3 mL  3 mL Nebulization BID Opyd, Ilene Qua, MD   3 mL at 05/25/18 2040  . isosorbide mononitrate (IMDUR) 24 hr tablet 30 mg  30 mg Oral QHS Opyd, Ilene Qua, MD   30 mg at 05/25/18 2305  . isosorbide mononitrate (IMDUR) 24 hr tablet 90 mg  90 mg Oral Daily Opyd, Ilene Qua, MD   90 mg at 05/26/18 0938  . latanoprost (XALATAN) 0.005 % ophthalmic solution 1 drop  1 drop Both Eyes Once per day on Mon Thu Opyd, Timothy S, MD      . levothyroxine (SYNTHROID, LEVOTHROID) tablet 125 mcg  125 mcg Oral QAC breakfast Vianne Bulls, MD   125 mcg at 05/26/18 0939  . lidocaine (XYLOCAINE) 2 % injection   Infiltration PRN Docia Barrier, PA   10 mL at 05/25/18 1200  . ondansetron (ZOFRAN) injection 4 mg  4 mg Intravenous Q6H PRN Opyd, Ilene Qua, MD      . pantoprazole (PROTONIX) EC tablet 40 mg  40 mg Oral BID Opyd, Ilene Qua, MD   40 mg at 05/26/18 0938  . polyethylene glycol (MIRALAX / GLYCOLAX) packet 17 g  17 g Oral BID Charlynne Cousins, MD   17 g at 05/26/18  0936  . predniSONE (DELTASONE) tablet 20 mg  20 mg Oral Q breakfast Charlynne Cousins, MD   20 mg at 05/26/18 [phone]  . ranolazine (RANEXA) 12 hr tablet 1,000 mg  1,000 mg Oral BID Opyd, Ilene Qua, MD   1,000 mg at 05/26/18 0937  . sodium chloride flush (NS) 0.9 % injection 3 mL  3 mL Intravenous Q12H Opyd, Ilene Qua, MD   3 mL at 05/26/18 0951  . sodium chloride flush (NS) 0.9 % injection 3 mL  3 mL  Intravenous PRN Opyd, Ilene Qua, MD      . tamsulosin (FLOMAX) capsule 0.4 mg  0.4 mg Oral QPC supper Opyd, Ilene Qua, MD   0.4 mg at 05/25/18 1706     Discharge Medications: Please see discharge summary for a list of discharge medications.  Relevant Imaging Results:  Relevant Lab Results:   Additional Information SS#: [id number]. He was at [facility] 5/11-5/20.  Candie Chroman, LCSW

## 2018-05-26 NOTE — Clinical Social Work Note (Signed)
Clinical Social Work Assessment  Patient Details  Name: John Peck MRN: [id number] Date of Birth: 01-Mar-1924  Date of referral:  05/26/18               Reason for consult:  Facility Placement, Discharge Planning                Permission sought to share information with:  Facility Sport and exercise psychologist, Family Supports Permission granted to share information::     Name::     [name]  Agency::  SNF's  Relationship::  Daughter  Contact Information:  [phone]  Housing/Transportation Living arrangements for the past 2 months:  Single Family Home Source of Information:  Medical Team, Adult Children Patient Interpreter Needed:  None Criminal Activity/Legal Involvement Pertinent to Current Situation/Hospitalization:  No - Comment as needed Significant Relationships:  Adult Children, Spouse Lives with:  Adult Children, Spouse Do you feel safe going back to the place where you live?  Yes Need for family participation in patient care:  Yes (Comment)  Care giving concerns:  PT recommending SNF once medically stable for discharge.   Social Worker assessment / plan:  Patient not fully oriented. No supports at bedside. CSW called patient's daughter, introduced role, and explained that PT recommendations would be discussed. Patient's daughter is agreeable to SNF placement. First preference is [facility], second preference is [facility]. Patient's daughter confirmed he was at [facility] in May but does not want him to return there. Patient was at [facility] from 5/11-5/20 and was admitted prior to 60 day wellness period being complete so he has only 11 days that insurance would cover at 100%. No further concerns. CSW encouraged patient's daughter to contact CSW as needed. CSW will continue to follow patient and his daughter for support and facilitate discharge to SNF once medically stable.  Employment status:  Retired Nurse, adult PT  Recommendations:  [place] / Referral to community resources:  [place]  Patient/Family's Response to care:  Patient not fully oriented. Patient's daughter agreeable to SNF placement. Patient's family supportive and involved in patient's care. Patient's daughter appreciated social work intervention.  Patient/Family's Understanding of and Emotional Response to Diagnosis, Current Treatment, and Prognosis:  Patient not fully oriented. Patient's daughter has a good understanding of the reason for admission and his need for rehab prior to returning home. Patient's daughter appears happy with hospital care.  Emotional Assessment Appearance:  Appears stated age Attitude/Demeanor/Rapport:  Unable to Assess Affect (typically observed):  Unable to Assess Orientation:  Oriented to Self, Oriented to Place Alcohol / Substance use:  Never Used Psych involvement (Current and /or in the community):  No (Comment)  Discharge Needs  Concerns to be addressed:  Care Coordination Readmission within the last 30 days:  No Current discharge risk:  Cognitively Impaired, Dependent with Mobility Barriers to Discharge:  Continued Medical Work up, [place], LCSW 05/26/2018, 10:10 AM

## 2018-05-26 NOTE — Evaluation (Signed)
Occupational Therapy Evaluation Patient Details Name: CATHY ROPP MRN: [id number] DOB: 09-03-1924 Today's Date: 05/26/2018    History of Present Illness Pt is a 84 y/o male admitted secondary to SOB and edema with Rt pleural effusion. PMH includes a fib, CAD s/p CABG, CKD, COPD, SSS s/p pacemaker placement, and CVA.    Clinical Impression   This 84 y/o male presents with the above. Prior to this admission pt was requiring assist for bathing/dressing ADLs, had mostly been using w/c for mobility and receiving assist for transfers. Pt requiring minA for short distance mobility in room using RW this session. Currently requires minA for UB ADL, mod-maxA for LB ADL. Pt presenting with generalized weakness and fatigue with activity. Pt will benefit with continued acute OT services and recommend follow up therapy services in SNF setting after discharge to maximize his strength, safety and independence with ADLs and mobility.     Follow Up Recommendations  SNF;Supervision/Assistance - 24 hour    Equipment Recommendations  None recommended by OT           Precautions / Restrictions Precautions Precautions: Fall Restrictions Weight Bearing Restrictions: No      Mobility Bed Mobility Overal bed mobility: Needs Assistance Bed Mobility: Supine to Sit;Sit to Supine;Rolling Rolling: Min guard   Supine to sit: Min assist;HOB elevated Sit to supine: Mod assist   General bed mobility comments: min assist to fully elevate trunk; assist for LEs onto EOB when returning to supine, VCs for safety/technique; minguard for rolling to L/R at start of session while NT completing patient care   Transfers Overall transfer level: Needs assistance Equipment used: Rolling walker (2 wheeled) Transfers: Sit to/from Stand Sit to Stand: Min assist         General transfer comment: min assist to stand with cues for hand placement    Balance Overall balance assessment: Needs  assistance Sitting-balance support: No upper extremity supported;Feet supported Sitting balance-Leahy Scale: Fair     Standing balance support: Bilateral upper extremity supported;During functional activity Standing balance-Leahy Scale: Poor Standing balance comment: Reliant on BUE support and external support                            ADL either performed or assessed with clinical judgement   ADL Overall ADL's : Needs assistance/impaired Eating/Feeding: Supervision/ safety;Sitting   Grooming: Set up;Sitting   Upper Body Bathing: Minimal assistance;Sitting   Lower Body Bathing: Moderate assistance;Sit to/from stand   Upper Body Dressing : Minimal assistance;Sitting   Lower Body Dressing: Maximal assistance;Sit to/from stand Lower Body Dressing Details (indicate cue type and reason): pt able to doff socks/ requires assist to don new socks; minA for sit<>stand and standing balance  Toilet Transfer: Minimal assistance;Stand-pivot;BSC;RW Toilet Transfer Details (indicate cue type and reason): simulated in transfer to/from EOB  Toileting- Clothing Manipulation and Hygiene: Minimal assistance;Sit to/from stand       Functional mobility during ADLs: Rolling walker;Cueing for safety;Minimal assistance General ADL Comments: pt had just returned to bed and washed up with assist from NT; completed bed mobility, sit<>Stand and taking few steps to/from EOB with overall minA. Pt with increased fatigue with activity                          Pertinent Vitals/Pain Pain Assessment: Faces Faces Pain Scale: Hurts a little bit Pain Location: inguinal region (just had new condom cath placed)  Pain Descriptors /  Indicators: Burning Pain Intervention(s): Monitored during session;Repositioned     Hand Dominance Right   Extremity/Trunk Assessment Upper Extremity Assessment Upper Extremity Assessment: Generalized weakness   Lower Extremity Assessment Lower Extremity  Assessment: Defer to PT evaluation   Cervical / Trunk Assessment Cervical / Trunk Assessment: Kyphotic   Communication Communication Communication: HOH   Cognition Arousal/Alertness: Awake/alert Behavior During Therapy: WFL for tasks assessed/performed Overall Cognitive Status: Impaired/Different from baseline Area of Impairment: Memory;Problem solving;Safety/judgement;Awareness                     Memory: Decreased short-term memory   Safety/Judgement: Decreased awareness of safety Awareness: Emergent Problem Solving: Slow processing     General Comments       Exercises     Shoulder Instructions      Home Living Family/patient expects to be discharged to:: Skilled nursing facility Living Arrangements: Spouse/significant other;Children Available Help at Discharge: Family;Available 24 hours/day Type of Home: House Home Access: Level entry     Home Layout: Multi-level;Able to live on main level with bedroom/bathroom     Bathroom Shower/Tub: Teacher, early years/pre: Standard     Home Equipment: Environmental consultant - 4 wheels;Bedside commode;Cane - single point;Shower seat;Walker - 2 wheels;Wheelchair - manual;Hospital bed   Additional Comments: Wife not able to physically assist.       Prior Functioning/Environment Level of Independence: Needs assistance  Gait / Transfers Assistance Needed: Daughter reports he has been using WC for mobility, however, has been requiring 2 people to transfer. Has been working with PT on ambulation with RW.  ADL's / Homemaking Assistance Needed: Needs assist with bathing/dressing and reports he sponge bathes.             OT Problem List: Decreased strength;Impaired balance (sitting and/or standing);Decreased cognition;Decreased activity tolerance;Decreased knowledge of use of DME or AE      OT Treatment/Interventions: Self-care/ADL training;DME and/or AE instruction;Therapeutic activities;Balance training;Therapeutic  exercise;Patient/family education    OT Goals(Current goals can be found in the care plan section) Acute Rehab OT Goals Patient Stated Goal: "to be able to walk" OT Goal Formulation: With patient Time For Goal Achievement: 06/09/18 Potential to Achieve Goals: Good  OT Frequency: Min 2X/week   Barriers to D/C:            Co-evaluation              AM-PAC PT "6 Clicks" Daily Activity     Outcome Measure Help from another person eating meals?: A Little Help from another person taking care of personal grooming?: A Little Help from another person toileting, which includes using toliet, bedpan, or urinal?: A Lot Help from another person bathing (including washing, rinsing, drying)?: A Lot Help from another person to put on and taking off regular upper body clothing?: A Little Help from another person to put on and taking off regular lower body clothing?: A Lot 6 Click Score: 15   End of Session Equipment Utilized During Treatment: Gait belt;Rolling walker Nurse Communication: Mobility status  Activity Tolerance: Patient tolerated treatment well;Patient limited by fatigue Patient left: in bed;with call bell/phone within reach;with bed alarm set;with family/visitor present  OT Visit Diagnosis: Muscle weakness (generalized) (M62.81);Unsteadiness on feet (R26.81)                Time: 5093-2671 OT Time Calculation (min): 24 min Charges:  OT General Charges $OT Visit: 1 Visit OT Evaluation $OT Eval Moderate Complexity: 1 Mod G-Codes:     [place]  Megan Salon, [place] Pager [phone] 05/26/2018  [name] 05/26/2018, 4:33 PM

## 2018-05-27 DIAGNOSIS — I2510 Atherosclerotic heart disease of native coronary artery without angina pectoris: Secondary | ICD-10-CM | POA: Diagnosis not present

## 2018-05-27 DIAGNOSIS — R0902 Hypoxemia: Secondary | ICD-10-CM | POA: Diagnosis not present

## 2018-05-27 DIAGNOSIS — R1312 Dysphagia, oropharyngeal phase: Secondary | ICD-10-CM | POA: Diagnosis not present

## 2018-05-27 DIAGNOSIS — R279 Unspecified lack of coordination: Secondary | ICD-10-CM | POA: Diagnosis not present

## 2018-05-27 DIAGNOSIS — Z7982 Long term (current) use of aspirin: Secondary | ICD-10-CM | POA: Diagnosis not present

## 2018-05-27 DIAGNOSIS — M6281 Muscle weakness (generalized): Secondary | ICD-10-CM | POA: Diagnosis not present

## 2018-05-27 DIAGNOSIS — R269 Unspecified abnormalities of gait and mobility: Secondary | ICD-10-CM | POA: Diagnosis not present

## 2018-05-27 DIAGNOSIS — I480 Paroxysmal atrial fibrillation: Secondary | ICD-10-CM | POA: Diagnosis not present

## 2018-05-27 DIAGNOSIS — R000 Tachycardia, unspecified: Secondary | ICD-10-CM | POA: Diagnosis not present

## 2018-05-27 DIAGNOSIS — N3090 Cystitis, unspecified without hematuria: Secondary | ICD-10-CM | POA: Diagnosis not present

## 2018-05-27 DIAGNOSIS — R05 Cough: Secondary | ICD-10-CM | POA: Diagnosis not present

## 2018-05-27 DIAGNOSIS — R531 Weakness: Secondary | ICD-10-CM | POA: Diagnosis not present

## 2018-05-27 DIAGNOSIS — R253 Fasciculation: Secondary | ICD-10-CM | POA: Diagnosis not present

## 2018-05-27 DIAGNOSIS — Z951 Presence of aortocoronary bypass graft: Secondary | ICD-10-CM | POA: Diagnosis not present

## 2018-05-27 DIAGNOSIS — I509 Heart failure, unspecified: Secondary | ICD-10-CM | POA: Diagnosis not present

## 2018-05-27 DIAGNOSIS — R410 Disorientation, unspecified: Secondary | ICD-10-CM | POA: Diagnosis not present

## 2018-05-27 DIAGNOSIS — R300 Dysuria: Secondary | ICD-10-CM | POA: Diagnosis not present

## 2018-05-27 DIAGNOSIS — Z7902 Long term (current) use of antithrombotics/antiplatelets: Secondary | ICD-10-CM | POA: Diagnosis not present

## 2018-05-27 DIAGNOSIS — I130 Hypertensive heart and chronic kidney disease with heart failure and stage 1 through stage 4 chronic kidney disease, or unspecified chronic kidney disease: Secondary | ICD-10-CM | POA: Diagnosis not present

## 2018-05-27 DIAGNOSIS — N3000 Acute cystitis without hematuria: Secondary | ICD-10-CM | POA: Diagnosis not present

## 2018-05-27 DIAGNOSIS — R4182 Altered mental status, unspecified: Secondary | ICD-10-CM | POA: Diagnosis not present

## 2018-05-27 DIAGNOSIS — E785 Hyperlipidemia, unspecified: Secondary | ICD-10-CM | POA: Diagnosis not present

## 2018-05-27 DIAGNOSIS — R630 Anorexia: Secondary | ICD-10-CM | POA: Diagnosis not present

## 2018-05-27 DIAGNOSIS — R41841 Cognitive communication deficit: Secondary | ICD-10-CM | POA: Diagnosis not present

## 2018-05-27 DIAGNOSIS — R29810 Facial weakness: Secondary | ICD-10-CM | POA: Diagnosis not present

## 2018-05-27 DIAGNOSIS — Z7901 Long term (current) use of anticoagulants: Secondary | ICD-10-CM | POA: Diagnosis not present

## 2018-05-27 DIAGNOSIS — Z950 Presence of cardiac pacemaker: Secondary | ICD-10-CM | POA: Diagnosis not present

## 2018-05-27 DIAGNOSIS — R404 Transient alteration of awareness: Secondary | ICD-10-CM | POA: Diagnosis not present

## 2018-05-27 DIAGNOSIS — Z87891 Personal history of nicotine dependence: Secondary | ICD-10-CM | POA: Diagnosis not present

## 2018-05-27 DIAGNOSIS — R400 Somnolence: Secondary | ICD-10-CM | POA: Diagnosis not present

## 2018-05-27 DIAGNOSIS — N179 Acute kidney failure, unspecified: Secondary | ICD-10-CM | POA: Diagnosis not present

## 2018-05-27 DIAGNOSIS — R498 Other voice and resonance disorders: Secondary | ICD-10-CM | POA: Diagnosis not present

## 2018-05-27 DIAGNOSIS — E860 Dehydration: Secondary | ICD-10-CM | POA: Diagnosis not present

## 2018-05-27 DIAGNOSIS — Z743 Need for continuous supervision: Secondary | ICD-10-CM | POA: Diagnosis not present

## 2018-05-27 DIAGNOSIS — I639 Cerebral infarction, unspecified: Secondary | ICD-10-CM | POA: Diagnosis not present

## 2018-05-27 DIAGNOSIS — Z79899 Other long term (current) drug therapy: Secondary | ICD-10-CM | POA: Diagnosis not present

## 2018-05-27 DIAGNOSIS — D649 Anemia, unspecified: Secondary | ICD-10-CM | POA: Diagnosis not present

## 2018-05-27 DIAGNOSIS — N390 Urinary tract infection, site not specified: Secondary | ICD-10-CM | POA: Diagnosis not present

## 2018-05-27 DIAGNOSIS — J449 Chronic obstructive pulmonary disease, unspecified: Secondary | ICD-10-CM | POA: Diagnosis not present

## 2018-05-27 DIAGNOSIS — G9341 Metabolic encephalopathy: Secondary | ICD-10-CM | POA: Diagnosis not present

## 2018-05-27 DIAGNOSIS — G459 Transient cerebral ischemic attack, unspecified: Secondary | ICD-10-CM | POA: Diagnosis not present

## 2018-05-27 DIAGNOSIS — N189 Chronic kidney disease, unspecified: Secondary | ICD-10-CM | POA: Diagnosis not present

## 2018-05-27 DIAGNOSIS — N183 Chronic kidney disease, stage 3 (moderate): Secondary | ICD-10-CM | POA: Diagnosis not present

## 2018-05-27 DIAGNOSIS — J90 Pleural effusion, not elsewhere classified: Secondary | ICD-10-CM | POA: Diagnosis not present

## 2018-05-27 DIAGNOSIS — R2689 Other abnormalities of gait and mobility: Secondary | ICD-10-CM | POA: Diagnosis not present

## 2018-05-27 DIAGNOSIS — R5383 Other fatigue: Secondary | ICD-10-CM | POA: Diagnosis not present

## 2018-05-27 DIAGNOSIS — R251 Tremor, unspecified: Secondary | ICD-10-CM | POA: Diagnosis not present

## 2018-05-27 DIAGNOSIS — R258 Other abnormal involuntary movements: Secondary | ICD-10-CM | POA: Diagnosis not present

## 2018-05-27 DIAGNOSIS — G253 Myoclonus: Secondary | ICD-10-CM | POA: Diagnosis not present

## 2018-05-27 DIAGNOSIS — N400 Enlarged prostate without lower urinary tract symptoms: Secondary | ICD-10-CM | POA: Diagnosis not present

## 2018-05-27 DIAGNOSIS — J9601 Acute respiratory failure with hypoxia: Secondary | ICD-10-CM | POA: Diagnosis not present

## 2018-05-27 DIAGNOSIS — J189 Pneumonia, unspecified organism: Secondary | ICD-10-CM | POA: Diagnosis not present

## 2018-05-27 DIAGNOSIS — E039 Hypothyroidism, unspecified: Secondary | ICD-10-CM | POA: Diagnosis not present

## 2018-05-27 DIAGNOSIS — I5043 Acute on chronic combined systolic (congestive) and diastolic (congestive) heart failure: Secondary | ICD-10-CM | POA: Diagnosis not present

## 2018-05-27 DIAGNOSIS — I10 Essential (primary) hypertension: Secondary | ICD-10-CM | POA: Diagnosis not present

## 2018-05-27 DIAGNOSIS — I5042 Chronic combined systolic (congestive) and diastolic (congestive) heart failure: Secondary | ICD-10-CM | POA: Diagnosis not present

## 2018-05-27 DIAGNOSIS — I959 Hypotension, unspecified: Secondary | ICD-10-CM | POA: Diagnosis not present

## 2018-05-27 DIAGNOSIS — Z8673 Personal history of transient ischemic attack (TIA), and cerebral infarction without residual deficits: Secondary | ICD-10-CM | POA: Diagnosis not present

## 2018-05-27 DIAGNOSIS — B964 Proteus (mirabilis) (morganii) as the cause of diseases classified elsewhere: Secondary | ICD-10-CM | POA: Diagnosis not present

## 2018-05-27 DIAGNOSIS — G629 Polyneuropathy, unspecified: Secondary | ICD-10-CM | POA: Diagnosis not present

## 2018-05-27 LAB — BASIC METABOLIC PANEL
ANION GAP: 9 (ref 5–15)
BUN: 27 mg/dL — ABNORMAL HIGH (ref 8–23)
CALCIUM: 8.3 mg/dL — AB (ref 8.9–10.3)
CO2: 29 mmol/L (ref 22–32)
Chloride: 100 mmol/L (ref 98–111)
Creatinine, Ser: 1.4 mg/dL — ABNORMAL HIGH (ref 0.61–1.24)
GFR, EST AFRICAN AMERICAN: 49 mL/min — AB (ref >60)
GFR, EST NON AFRICAN AMERICAN: 42 mL/min — AB (ref >60)
GLUCOSE: 103 mg/dL — AB (ref 70–99)
POTASSIUM: 3.9 mmol/L (ref 3.5–5.1)
Sodium: 138 mmol/L (ref 135–145)

## 2018-05-27 MED ORDER — ISOSORBIDE MONONITRATE ER 30 MG PO TB24: 90.0000 mg | ORAL_TABLET | Freq: Every day | ORAL | 5 refills | Status: DC

## 2018-05-27 MED ORDER — AZITHROMYCIN 500 MG PO TABS: 500.0000 mg | ORAL_TABLET | Freq: Every day | ORAL | 0 refills | Status: DC

## 2018-05-27 MED ORDER — LACTULOSE 10 GM/15ML PO SOLN
30.0000 g | Freq: Once | ORAL | Status: AC
  Administered 2018-05-27: 30 g via ORAL
  Filled 2018-05-27: qty 45

## 2018-05-27 MED ORDER — BISACODYL 10 MG RE SUPP
10.0000 mg | Freq: Once | RECTAL | Status: AC
  Administered 2018-05-27: 10 mg via RECTAL
  Filled 2018-05-27: qty 1

## 2018-05-27 MED ORDER — FUROSEMIDE 20 MG PO TABS: 40.0000 mg | ORAL_TABLET | Freq: Two times a day (BID) | ORAL | 5 refills | Status: DC

## 2018-05-27 MED ORDER — FERROUS SULFATE 325 (65 FE) MG PO TABS: 325.0000 mg | ORAL_TABLET | Freq: Two times a day (BID) | ORAL | 3 refills | Status: AC

## 2018-05-27 MED ORDER — TAMSULOSIN HCL 0.4 MG PO CAPS: 0.4000 mg | ORAL_CAPSULE | Freq: Every day | ORAL | 2 refills | Status: AC

## 2018-05-27 MED ORDER — ALBUTEROL SULFATE HFA 108 (90 BASE) MCG/ACT IN AERS: 2.0000 | INHALATION_SPRAY | RESPIRATORY_TRACT | 0 refills | Status: AC | PRN

## 2018-05-27 MED ORDER — PANTOPRAZOLE SODIUM 40 MG PO TBEC: 40.0000 mg | DELAYED_RELEASE_TABLET | Freq: Every day | ORAL | 5 refills | Status: AC

## 2018-05-27 MED ORDER — RANOLAZINE ER 1000 MG PO TB12: 1000.0000 mg | ORAL_TABLET | Freq: Two times a day (BID) | ORAL | 1 refills | Status: DC

## 2018-05-27 MED ORDER — PREDNISONE 20 MG PO TABS: 20.0000 mg | ORAL_TABLET | Freq: Every day | ORAL | 0 refills | Status: AC

## 2018-05-27 MED ORDER — POLYETHYLENE GLYCOL 3350 17 G PO PACK: 17.0000 g | PACK | Freq: Every day | ORAL | 0 refills | Status: AC

## 2018-05-27 MED ORDER — CLOPIDOGREL BISULFATE 75 MG PO TABS
75.0000 mg | ORAL_TABLET | Freq: Every day | ORAL | 3 refills | Status: DC
Start: 2018-05-27 — End: 2019-02-17

## 2018-05-27 MED ORDER — GUAIFENESIN ER 600 MG PO TB12: 600.0000 mg | ORAL_TABLET | Freq: Two times a day (BID) | ORAL | 0 refills | Status: AC

## 2018-05-27 MED ORDER — CARVEDILOL 12.5 MG PO TABS: 12.5000 mg | ORAL_TABLET | Freq: Two times a day (BID) | ORAL | 5 refills | Status: DC

## 2018-05-27 MED ORDER — ISOSORBIDE MONONITRATE ER 30 MG PO TB24: 30.0000 mg | ORAL_TABLET | Freq: Every day | ORAL | 2 refills | Status: DC

## 2018-05-27 MED ORDER — ALPRAZOLAM 0.25 MG PO TABS: 0.2500 mg | ORAL_TABLET | Freq: Two times a day (BID) | ORAL | 0 refills | Status: DC | PRN

## 2018-05-27 NOTE — Clinical Social Work Placement (Signed)
   CLINICAL SOCIAL WORK PLACEMENT  NOTE  Date:  05/27/2018  Patient Details  Name: John Peck MRN: [id number] Date of Birth: 23-Jan-1924  Clinical Social Work is seeking post-discharge placement for this patient at the [place] level of care (*CSW will initial, date and re-position this form in  chart as items are completed):  Yes   Patient/family provided with [place] Work Department's list of facilities offering this level of care within the geographic area requested by the patient (or if unable, by the patient's family).  Yes   Patient/family informed of their freedom to choose among providers that offer the needed level of care, that participate in Medicare, Medicaid or managed care program needed by the patient, have an available bed and are willing to accept the patient.  Yes   Patient/family informed of [facility] ownership interest in [facility] and [facility], as well as of the fact that they are under no obligation to receive care at these facilities.  PASRR submitted to EDS on 05/26/18     PASRR number received on       Existing PASRR number confirmed on 05/26/18     FL2 transmitted to all facilities in geographic area requested by pt/family on 05/26/18     FL2 transmitted to all facilities within larger geographic area on       Patient informed that his/her managed care company has contracts with or will negotiate with certain facilities, including the following:        Yes   Patient/family informed of bed offers received.  Patient chooses bed at [facility]     Physician recommends and patient chooses bed at      Patient to be transferred to [facility] on 05/27/18.  Patient to be transferred to facility by [facility]     Patient family notified on 05/27/18 of transfer.  Name of family member notified:  Scot Shiraishi     PHYSICIAN Please prepare prescriptions     Additional Comment:     _______________________________________________ Candie Chroman, LCSW 05/27/2018, 5:06 PM

## 2018-05-27 NOTE — Clinical Social Work Note (Signed)
CSW facilitated patient discharge including contacting patient family and facility to confirm patient discharge plans. Clinical information faxed to facility and family agreeable with plan. CSW arranged ambulance transport via [facility] to [facility]. RN to call report prior to discharge [phone] Room 1202).  CSW will sign off for now as social work intervention is no longer needed. Please consult [place] again if new needs arise.  Dayton Scrape, [place]

## 2018-05-27 NOTE — Clinical Social Work Note (Addendum)
Insurance authorization for [employer] still pending.  Dayton Scrape, [place] [phone]  1:26 pm Patient's family will need to pay 10 days of $164 copays up front. CSW notified family and they are agreeable. Insurance authorization still pending. Admissions coordinator is calling [employer] to check status.  Dayton Scrape, [place] [phone]  3:57 pm Insurance authorization still under review but [facility] representative told SNF admissions coordinator it has been sent to the medical director for the final decision so we should hear something soon.  Dayton Scrape, [place] [phone]  4:50 pm Patient has insurance approval to discharge to [facility] today. MD and daughter notified. Daughter wanting to know if patient has had a BM yet.  Dayton Scrape, [place] [phone]  5:00 pm Per MD and RN, patient has had two BM's today. CSW left voicemail for daughter to notify her that everything was on track for discharge today.  Dayton Scrape, [place]

## 2018-05-27 NOTE — Consult Note (Signed)
   [facility] CM Inpatient Consult   05/27/2018  WALT GEATHERS 1924/05/24 [id number]  Referral received from inpatient social worker, Judson Roch.  Patient is for a skilled nursing stay for 10 days, family seeking assistance with transitioning needs for returning home. Met with patient's wife, Raquel Sarna, daughters, and granddaughter regarding [place] management services available to patient for transitional needs for returning home from short term SNF while working on [place] and ALF potentially.  Wife states, "I just can't do much physical anything because of my back.  We have been married almost 51 years and I am so concerned because we have never been apart."  Will assign a [facility] social worker to assist with transitional needs and with working on arranging some short term services.  Consent form signed and copy with folder given to  wife with contact information. Contacted [facility] and left a voicemail message for a return call for referral for services.  Awaiting a return call.  For questions, please contact:  Natividad Brood, RN BSN [facility] Liaison  [phone] business mobile phone Toll free office [phone]

## 2018-05-27 NOTE — Progress Notes (Signed)
Family has paperwork for [employer] however volunteers have left for the day and they are suspecting that he will be discharged today.  WE are happy to help him complete once volunteers return.  No procedure or emergent situation taken place.  Family will also see about getting done outside of hospital.  Please page if additional assistance is needed.    05/27/18 1505  Clinical Encounter Type  Visited With Patient and family together  Visit Type Initial;Spiritual support

## 2018-05-27 NOTE — Discharge Instructions (Signed)
1)Very low-salt diet advised 2)Weigh yourself daily, call if you gain more than 3 pounds in 1 day or more than 5 pounds in 1 week as your diuretic medications may need to be adjusted 3)Limit your Fluid  intake to no more than 60 ounces (1.8 Liters) per day 4) repeat BMP and CBC blood test on Tuesday, 05/31/2018 5)Avoid ibuprofen/Advil/Aleve/Motrin/Goody Powders/Naproxen/BC powders/Meloxicam/Diclofenac/Indomethacin and other Nonsteroidal anti-inflammatory medications as these will make you more likely to bleed and can cause stomach ulcers, can also cause Kidney problems.

## 2018-05-27 NOTE — Progress Notes (Signed)
RN called report to Festus at [facility], he is the nurse receiving the patient.

## 2018-05-27 NOTE — Discharge Summary (Addendum)
John Peck, is a 84 y.o. male  DOB December 13, 1923  MRN [id number].  Admission date:  05/24/2018  Admitting Physician  Vianne Bulls, MD  Discharge Date:  05/27/2018   Primary MD  Seward Carol, MD  Recommendations for primary care physician for things to follow:   1)Very low-salt diet advised 2)Weigh yourself daily, call if you gain more than 3 pounds in 1 day or more than 5 pounds in 1 week as your diuretic medications may need to be adjusted 3)Limit your Fluid  intake to no more than 60 ounces (1.8 Liters) per day 4) repeat BMP and CBC blood test on Tuesday, 05/31/2018 5)Avoid ibuprofen/Advil/Aleve/Motrin/Goody Powders/Naproxen/BC powders/Meloxicam/Diclofenac/Indomethacin and other Nonsteroidal anti-inflammatory medications as these will make you more likely to bleed and can cause stomach ulcers, can also cause Kidney problems.    Admission Diagnosis  Pleural effusion [J90] Acute respiratory failure with hypoxia (HCC) [J96.01] Acute on chronic congestive heart failure, unspecified heart failure type (HCC) [I50.9]   Discharge Diagnosis  Pleural effusion [J90] Acute respiratory failure with hypoxia (HCC) [J96.01] Acute on chronic congestive heart failure, unspecified heart failure type (HCC) [I50.9]    Principal Problem:   Acute on chronic combined systolic and diastolic CHF (congestive heart failure) (HCC) Active Problems:   Essential hypertension   PAF (paroxysmal atrial fibrillation) (HCC)   CAD in native artery   Sick sinus syndrome ([place])   Pancytopenia (HCC)   COPD clinical dx/ no pfts on record   CKD (chronic kidney disease), stage III (HCC)   Hypothyroidism   Pleural effusion   Acute respiratory failure with hypoxia (HCC)   Pressure injury of skin      Past Medical History:  Diagnosis Date  . Anemia   . Arthritis   . CAD (coronary artery disease)    a. CABG '89; b. PCI '04; c.  11/2015 Cath/PCI: LM 20ost, LAD 125m, LCX 30p, OM2 40, RCA 30p, 10p ISR, 90d (3.0x12 Synergy DES), AM 90, VG->OM2 known to be 100, LIMA->LAD not injected, patent in 2015.  . Carotid stenosis, right   . Chronic combined systolic and diastolic CHF (congestive heart failure) ([place])   . CKD (chronic kidney disease), stage III ([place])   . H/O: GI bleed    REMOTE HISTORY  . History of COPD   . Hyperlipidemia   . Hypertensive heart disease   . LBBB (left bundle branch block)   . Pacemaker Sept 2009   St Jude  . PAF (paroxysmal atrial fibrillation) (HCC)    a. not on anticoag due to prior GIB.  . Sick sinus syndrome [facility]) Sept 2009   ST Jude PTVDP  . Stroke [facility])     Past Surgical History:  Procedure Laterality Date  . CARDIAC CATHETERIZATION  11/2011   EF 50%; significant native CAD w/80% stenosis of the LAD after 2nd giagonal vessel and septal perforating artery w/total occlusion of the mid left anterior descendiung.; 60-70% ostiasl stenosis in the circumflex vessel followed by 40% proximal stenosis and 70-80% distal circumflex stenosis;   .  CARDIAC CATHETERIZATION N/A 11/29/2015   Procedure: Left Heart Cath and Cors/Grafts Angiography;  Surgeon: Burnell Blanks, MD;  Location: [place] [facility] LAB;  Service: Cardiovascular;  Laterality: N/A;  . CARDIAC CATHETERIZATION N/A 11/29/2015   Procedure: Coronary Stent Intervention;  Surgeon: Burnell Blanks, MD;  Location: [place] [facility] LAB;  Service: Cardiovascular;  Laterality: N/A;  . CATARACT EXTRACTION  2004  . CORONARY ANGIOGRAPHY N/A 02/25/2017   Procedure: Coronary Angiography;  Surgeon: Belva Crome, MD;  Location: [place] [facility];  Service: Cardiovascular;  Laterality: N/A;  . CORONARY ANGIOPLASTY WITH STENT PLACEMENT  2004   RCA  . CORONARY ARTERY BYPASS GRAFT  1989   had LIMA to his LAD, a vein to the circumflex. In 2004 underwent stenting to his right coronary artery.  . CORONARY STENT INTERVENTION N/A 02/24/2017    Procedure: Coronary Stent Intervention;  Surgeon: Wellington Hampshire, MD;  Location: [place] [facility];  Service: Cardiovascular;  Laterality: N/A;  cfx  . HEMORRHOID SURGERY    . INSERT / REPLACE / REMOVE PACEMAKER  07/17/08   DUAL-CHAMBER; PPM-ST.JUDE MEDNET  . IR THORACENTESIS ASP PLEURAL SPACE W/IMG GUIDE  05/25/2018  . LEFT HEART CATH AND CORS/GRAFTS ANGIOGRAPHY N/A 02/24/2017   Procedure: Left Heart Cath and Cors/Grafts Angiography;  Surgeon: Wellington Hampshire, MD;  Location: [place] [facility] LAB;  Service: Cardiovascular;  Laterality: N/A;  . LEFT HEART CATHETERIZATION WITH CORONARY/GRAFT ANGIOGRAM N/A 12/10/2011   Procedure: LEFT HEART CATHETERIZATION WITH Beatrix Fetters;  Surgeon: Troy Sine, MD;  Location: [facility] CATH LAB;  Service: Cardiovascular;  Laterality: N/A;  . LEFT HEART CATHETERIZATION WITH CORONARY/GRAFT ANGIOGRAM N/A 01/26/2014   Procedure: LEFT HEART CATHETERIZATION WITH Beatrix Fetters;  Surgeon: Troy Sine, MD;  Location: [facility] CATH LAB;  Service: Cardiovascular;  Laterality: N/A;  . PPM GENERATOR CHANGEOUT N/A 12/15/2017   Procedure: PPM GENERATOR CHANGEOUT;  Surgeon: Evans Lance, MD;  Location: [place] [facility] LAB;  Service: Cardiovascular;  Laterality: N/A;  . PROSTATECTOMY         HPI  from the history and physical done on the day of admission:    Chief Complaint: SOB, leg swelling, malaise   HPI: John Peck is a 84 y.o. male with medical history significant for chronic combined CHF, paroxysmal atrial fibrillation no longer on anticoagulation due to history of GI bleed, sick sinus syndrome with pacer, coronary artery disease, COPD, and hypothyroidism, now presenting to the emergency department for evaluation of shortness of breath, swelling, and general malaise.  Patient is accompanied by his daughters who assist with the history.  He has reportedly developed progressive dyspnea over the past week with bilateral leg swelling and a general malaise.   Symptoms have been worse with exertion and he has also been symptomatic at rest for the past couple days.  He tried to work with home PT today but was too dyspneic and generally weak.  This prompted his transport to the ED for further evaluation.  Patient reports some chest discomfort earlier in the week, but none now.  He denies any significant cough or sputum production.  ED Course: Upon arrival to the ED, patient is found to be afebrile, requiring supplemental oxygen in order to maintain saturation of 90%, tachypneic, bradycardic, and with stable blood pressure.  EKG features a junctional rhythm with anterolateral T wave abnormality.  Chest x-ray is notable for enlarged right pleural effusion, stable right apical airspace opacity, and increased bibasilar opacities concerning for pneumonia  or edema.  Chemistry panel features a stable creatinine at 1.56 and CBC is notable for pancytopenia with WBC 3600, hemoglobin 10.0, and platelets 139,000.  Troponin is slightly elevated to 0.03 and BNP is elevated at 1210.  Patient was treated with 40 mg IV Lasix in the ED, DuoNeb's, 125 mg IV Solu-Medrol, and pacer is being interrogated.  Right lower extremity venous Doppler was performed with preliminary read negative for DVT.  Patient has begun to diurese, continues to have diffuse wheezes and dyspnea at rest, and will be admitted for ongoing evaluation and management   Hospital Course:    Brief summary:- John Peck an 84 y.o.malepast medical history of chronic combined systolic and diastolic heart failure, paroxysmal atrial fibrillation not on anticoagulation due to GI bleed, sick sinus syndrome with pacer, CAD now presenting to the emergency department for evaluation of shortness of breath and swelling. He was found to be satting at 90% tachypneic and bradycardic, status post Rt thoracentesis on 05/25/2018 with removal of 1.5 L of fluid   Plan:-  1)HFrEF---  overall much improved with diuresis and  right-sided thoracentesis, admitted with acute on chronic combined systolic and diastolic dysfunction CHF exacerbation (last known EF 45 to 50%), i, hypoxia is resolved,  treated with IV Lasix, with transition to Lasix p.o. 40 mg twice daily post discharge, c/n Coreg 12.5 mg twice daily, unable to use Aldactone due to kidney concerns, recheck BMP on Tuesday, 05/31/2018, very low-salt diet advised.  Patient is ambulating without significant dyspnea on exertion or hypoxia at this time  2)Rt Sided Pleural Effusion---- respiratory status improved significantly after thoracentesis on 05/25/2018 with removal of 1.5 L of fluid, fluid studies reveals Gram stain without any organisms and negative culture, suspect CHF related effusion,  3) acute COPD exacerbation--much improved, okay to complete azithromycin and prednisone as prescribed ,  use bronchodilators judiciously,  Hypoxia has resolved as noted above #1  4) CKD stage III-   stable, Creatinine is 1.4 which is close to baseline,,  Avoid nephrotoxic agents/dehydration/hypotension  5)PAF--- stable, continue Coreg for rate control, not on anticoagulation due to history of GI bleed  6)Pancytopenia-- ???  Etiology, , repeat CBC as outpatient on 05/31/2018, may need outpatient hematology work-up  7) coronary artery disease/status post prior CABG--no frank ACS type symptoms at this time, continue aspirin, Plavix, Coreg 12.5 mg twice daily, as well as isosorbide 90 mg daily and imdur 30 mg every afternoon,  c/n ranexa and atorvastatin and zetia  8) hypothyroidism--stable, continue levothyroxine 125 mcg daily  9)Disposition--- patient has generalized weakness and debility,  physical therapy evaluation appreciated, transferred to skilled nursing facility rehab prior to returning home  Code Status : Full code  Disposition Plan  :  # 9 above  Consults  : IR for right-sided thoracentesis  DVT Prophylaxis  :   - SCDs (pancytopenia)   Discharge  Condition: stable  Follow UP  Contact information for after-discharge care    Destination    [facility]erred [facility]F .   Service:  Skilled Nursing Contact information: [place] [phone]              Diet and Activity recommendation:  As advised  Discharge Instructions    Discharge Instructions    ([place]) Call MD:  Anytime you have any of the following symptoms: 1) 3 pound weight gain in 24 hours or 5 pounds in 1 week 2) shortness of breath, with or without a  dry hacking cough 3) swelling in the hands, feet or stomach 4) if you have to sleep on extra pillows at night in order to breathe.   Complete by:  As directed    AMB Referral to [facility]   Complete by:  As directed    Please assign patient to social worker for post hospital follow up at skilled facility for [facility] for a 10 day stay.  Assist with transitional care needs with returning home, requesting [facility] for personal care services in assisting family with return home.  Family has a Medicaid application already in process, needs assistance and guidance. CSW -please assign to community nurse for transition of care for home visits as well.  They will also need home health.  Tried to explain the differences. For questions,   Natividad Brood, RN BSN [facility] Liaison  [phone] business mobile phone Toll free office [phone]   Reason for consult:  Please assign to social worker   Diagnoses of:   Stroke: Ischemic/TIA COPD/ Pneumonia Heart Failure     Expected date of contact:  1-3 days (reserved for hospital discharges)   Call MD for:  difficulty breathing, headache or visual disturbances   Complete by:  As directed    Call MD for:  persistant dizziness or light-headedness   Complete by:  As directed    Call MD for:  persistant nausea and vomiting   Complete by:  As directed    Call MD for:   severe uncontrolled pain   Complete by:  As directed    Call MD for:  temperature >100.4   Complete by:  As directed    Diet - low sodium heart healthy   Complete by:  As directed    Discharge instructions   Complete by:  As directed    1)Very low-salt diet advised 2)Weigh yourself daily, call if you gain more than 3 pounds in 1 day or more than 5 pounds in 1 week as your diuretic medications may need to be adjusted 3)Limit your Fluid  intake to no more than 60 ounces (1.8 Liters) per day 4) repeat BMP and CBC blood test on Tuesday, 05/31/2018 5)Avoid ibuprofen/Advil/Aleve/Motrin/Goody Powders/Naproxen/BC powders/Meloxicam/Diclofenac/Indomethacin and other Nonsteroidal anti-inflammatory medications as these will make you more likely to bleed and can cause stomach ulcers, can also cause Kidney problems.   Increase activity slowly   Complete by:  As directed         Discharge Medications     Allergies as of 05/27/2018   No Known Allergies     Medication List    STOP taking these medications   cholecalciferol 1000 units tablet Commonly known as:  VITAMIN D   docusate sodium 100 MG capsule Commonly known as:  COLACE   guaiFENesin-dextromethorphan 100-10 MG/5ML syrup Commonly known as:  ROBITUSSIN DM   nitroGLYCERIN 0.4 MG SL tablet Commonly known as:  NITROSTAT     TAKE these medications   albuterol 108 (90 Base) MCG/ACT inhaler Commonly known as:  PROVENTIL HFA;VENTOLIN HFA Inhale 2 puffs into the lungs every 4 (four) hours as needed for wheezing or shortness of breath (or coughing). What changed:  how much to take   ALPRAZolam 0.25 MG tablet Commonly known as:  XANAX Take 1 tablet (0.25 mg total) by mouth 2 (two) times daily as needed for anxiety.   aspirin 81 MG tablet Take 81 mg by mouth daily.   atorvastatin 40 MG tablet Commonly known as:  LIPITOR  Take 1 tablet (40 mg total) by mouth daily.   azithromycin 500 MG tablet Commonly known as:  ZITHROMAX Take 1  tablet (500 mg total) by mouth daily.   carvedilol 12.5 MG tablet Commonly known as:  COREG Take 1 tablet (12.5 mg total) by mouth 2 (two) times daily with a meal. What changed:    medication strength  how much to take   clopidogrel 75 MG tablet Commonly known as:  PLAVIX Take 1 tablet (75 mg total) by mouth daily. What changed:  when to take this   ezetimibe 10 MG tablet Commonly known as:  ZETIA Take 10 mg by mouth daily.   ferrous sulfate 325 (65 FE) MG tablet Take 1 tablet (325 mg total) by mouth 2 (two) times daily with a meal.   furosemide 20 MG tablet Commonly known as:  LASIX Take 2 tablets (40 mg total) by mouth 2 (two) times daily. What changed:    how much to take  when to take this   guaiFENesin 600 MG 12 hr tablet Commonly known as:  MUCINEX Take 1 tablet (600 mg total) by mouth 2 (two) times daily for 10 days.   ICY HOT EX Apply 1 application topically daily as needed (aches and pains).   ipratropium-albuterol 0.5-2.5 (3) MG/3ML Soln Commonly known as:  DUONEB Take 3 mLs by nebulization 2 (two) times daily.   isosorbide mononitrate 30 MG 24 hr tablet Commonly known as:  IMDUR Take 1 tablet (30 mg total) by mouth at bedtime. What changed:  You were already taking a medication with the same name, and this prescription was added. Make sure you understand how and when to take each.   isosorbide mononitrate 30 MG 24 hr tablet Commonly known as:  IMDUR Take 3 tablets (90 mg total) by mouth daily. Start taking on:  05/28/2018 What changed:    medication strength  how much to take  how to take this  when to take this  additional instructions   latanoprost 0.005 % ophthalmic solution Commonly known as:  XALATAN Place 1 drop into both eyes 2 (two) times a week.   levothyroxine 125 MCG tablet Commonly known as:  SYNTHROID, LEVOTHROID TAKE 1 TABLET BY MOUTH EVERY DAY BEFORE BREAKFAST   pantoprazole 40 MG tablet Commonly known as:   PROTONIX Take 1 tablet (40 mg total) by mouth daily. Start taking on:  05/28/2018 What changed:  when to take this   polyethylene glycol packet Commonly known as:  MIRALAX / GLYCOLAX Take 17 g by mouth daily. What changed:    when to take this  reasons to take this   predniSONE 20 MG tablet Commonly known as:  DELTASONE Take 1 tablet (20 mg total) by mouth daily with breakfast for 3 days. Start taking on:  05/28/2018   ranolazine 1000 MG SR tablet Commonly known as:  RANEXA Take 1 tablet (1,000 mg total) by mouth 2 (two) times daily.   tamsulosin 0.4 MG Caps capsule Commonly known as:  FLOMAX Take 1 capsule (0.4 mg total) by mouth daily.       Major procedures and Radiology Reports - PLEASE review detailed and final reports for all details, in brief -   Dg Chest 1 View  Result Date: 05/25/2018 CLINICAL DATA:  Status post right-sided thoracentesis. EXAM: CHEST  1 VIEW COMPARISON:  May 24, 2018 and Mar 18, 2018 FINDINGS: No pneumothorax. Right pleural effusion smaller following thoracentesis. Small right pleural effusion remains. There is stable scarring in  the upper lobes, more on the right than on the left. There is a minimal left pleural effusion with scarring in the left base. There is no frank airspace consolidation. Heart is upper normal in size with pulmonary vascularity normal. Pacemaker leads are attached to the right atrium and right ventricle. Patient is status post coronary artery bypass grafting. Prominence in the right paratracheal region is stable and felt to represent great vessel prominence in this age group. No bone lesions evident. IMPRESSION: Right pleural effusion smaller following thoracentesis. No pneumothorax. Fairly small right pleural effusion remains. Areas of scarring in the upper lobes and left base remain. There is a minimal left pleural effusion. Stable cardiac silhouette. Electronically Signed   By: Lowella Grip III M.D.   On: 05/25/2018 13:25    Dg Chest Port 1 View  Result Date: 05/24/2018 CLINICAL DATA:  SOB Pt was working with physical therapy at home when he started feeling SOB and having generalized weakness. Pt has on demand pacemaker and EMS noticed it was pacing at random rates varying from 50-100. Pt has hx of CHF and CABG No chest pain EXAM: PORTABLE CHEST 1 VIEW COMPARISON:  04/15/2018 FINDINGS: Moderate to large right pleural effusion. Right basilar airspace opacity compatible with atelectasis or pneumonia. Mildly confluent airspace opacity at the right lung apex, roughly similar to prior. Bilateral interstitial accentuation with mild cardiomegaly and prior median sternotomy and CABG. Minimal blunting of the left lateral costophrenic angle with indistinct airspace opacity at left lung base. Dual lead pacer in place. IMPRESSION: 1. Enlarged right pleural effusion with stable right apical airspace opacity and increased bibasilar airspace opacities potentially from edema or multifocal pneumonia. 2. Small left pleural effusion. 3. Mild to moderate enlargement of the cardiopericardial silhouette with prior CABG. Electronically Signed   By: Van Clines M.D.   On: 05/24/2018 18:02   Ir Thoracentesis Asp Pleural Space W/img Guide  Result Date: 05/25/2018 INDICATION: Symptomatic right pleural effusion - request for diagnostic thoracentesis EXAM: ULTRASOUND GUIDED RIGHT THORACENTESIS MEDICATIONS: 13 mL 2% lidocaine COMPLICATIONS: None immediate. PROCEDURE: An ultrasound guided thoracentesis was thoroughly discussed with the patient and questions answered. The benefits, risks, alternatives and complications were also discussed. The patient understands and wishes to proceed with the procedure. Written consent was obtained. Ultrasound was performed to localize and mark an adequate pocket of fluid in the right chest. The area was then prepped and draped in the normal sterile fashion. 2% Lidocaine was used for local anesthesia. Under  ultrasound guidance a 6 Fr Safe-T-Centesis catheter was introduced. Thoracentesis was performed. The catheter was removed and a dressing applied. FINDINGS: A total of approximately 1.5L of golden yellow fluid was removed. Samples were sent to the laboratory as requested by the clinical team. IMPRESSION: Successful ultrasound guided right thoracentesis yielding 1.5L of pleural fluid. Read by Candiss Norse, PA-C Electronically Signed   By: Jacqulynn Cadet M.D.   On: 05/25/2018 13:31    Micro Results   Recent Results (from the past 240 hour(s))  MRSA PCR Screening     Status: None   Collection Time: 05/25/18  8:12 AM  Result Value Ref Range Status   MRSA by PCR NEGATIVE NEGATIVE Final    Comment:        The GeneXpert MRSA Assay (FDA approved for NASAL specimens only), is one component of a comprehensive MRSA colonization surveillance program. It is not intended to diagnose MRSA infection nor to guide or monitor treatment for MRSA infections. Performed at [facility]  Lab, [address], [place], [place] [address]   Gram stain     Status: None   Collection Time: 05/25/18  1:02 PM  Result Value Ref Range Status   Specimen Description PLEURAL RIGHT  Final   Special Requests NONE  Final   Gram Stain   Final    FEW WBC PRESENT, PREDOMINANTLY MONONUCLEAR NO ORGANISMS SEEN Performed at [facility], [address] [place], [place] [address]    Report Status 05/26/2018 FINAL  Final  Culture, body fluid-bottle     Status: None (Preliminary result)   Collection Time: 05/25/18  1:02 PM  Result Value Ref Range Status   Specimen Description PLEURAL RIGHT  Final   Special Requests NONE  Final   Culture   Final    NO GROWTH 2 DAYS Performed at [facility] [address], [place], [place] [address]    Report Status PENDING  Incomplete       Today   Subjective    John Peck today has no new complaints, had BM x 2, no shortness of breath at rest, no chest  pains no palpitations, no fever no chills no productive cough.  Hypoxia has resolved          Patient has been seen and examined prior to discharge   Objective   Blood pressure 134/69, pulse 62, temperature 97.9 F (36.6 C), temperature source Oral, resp. rate 18, height 5\' 9"  (1.753 m), weight 71.2 kg (157 lb), SpO2 98 %.   Intake/Output Summary (Last 24 hours) at 05/27/2018 1717 Last data filed at 05/27/2018 1503 Gross per 24 hour  Intake 568 ml  Output 1201 ml  Net -633 ml    Exam Gen:- Awake Alert,  In no apparent distress , speaking in complete sentences HEENT:- [place].AT, No sclera icterus Ears- HOH Neck-Supple Neck,No JVD,.  Lungs-improved air movement, no wheezing CV- S1, S2 normal, irregular, CABG scar, left subclavian area pacemaker in situ Abd-  +ve B.Sounds, Abd Soft, No tenderness,    Extremity/Skin:- trace  edema, good pulses Psych-affect is appropriate, oriented x3 Neuro-no new focal deficits, no tremors     Data Review   CBC w Diff:  Lab Results  Component Value Date   WBC 3.6 (L) 05/24/2018   HGB 10.0 (L) 05/24/2018   HGB 10.3 (L) 12/10/2017   HGB 11.0 (L) 12/25/2016   HCT 31.1 (L) 05/24/2018   HCT 31.8 (L) 12/10/2017   HCT 32.2 (L) 12/25/2016   PLT 139 (L) 05/24/2018   PLT 129 (L) 12/10/2017   LYMPHOPCT 9 03/19/2018   LYMPHOPCT 25.4 12/25/2016   MONOPCT 10 03/19/2018   MONOPCT 13.4 12/25/2016   EOSPCT 2 03/19/2018   EOSPCT 8.1 (H) 12/25/2016   BASOPCT 0 03/19/2018   BASOPCT 0.6 12/25/2016    CMP:  Lab Results  Component Value Date   NA 138 05/27/2018   NA 140 12/10/2017   NA 145 01/19/2014   K 3.9 05/27/2018   K 3.8 01/19/2014   CL 100 05/27/2018   CL 109 (H) 01/20/2013   CO2 29 05/27/2018   CO2 26 01/19/2014   BUN 27 (H) 05/27/2018   BUN 23 12/10/2017   BUN 17.1 01/19/2014   CREATININE 1.40 (H) 05/27/2018   CREATININE 1.23 (H) 01/14/2017   CREATININE 1.2 01/19/2014   PROT 6.0 (L) 05/25/2018   PROT 6.8 07/28/2017   PROT 7.6  01/19/2014   ALBUMIN 2.7 (L) 05/25/2018   ALBUMIN 4.0 07/28/2017  ALBUMIN 3.4 (L) 01/19/2014   BILITOT 1.0 05/25/2018   BILITOT 0.6 07/28/2017   BILITOT 0.51 01/19/2014   ALKPHOS 46 05/25/2018   ALKPHOS 92 01/19/2014   AST 35 05/25/2018   AST 11 01/19/2014   ALT 35 05/25/2018   ALT 8 01/19/2014  .   Total Discharge time is about 33 minutes  Roxan Hockey M.D on 05/27/2018 at 5:17 PM   Go to [email or web address] - password [email or web address] for contact info  [facility] - Office  [phone]

## 2018-05-30 ENCOUNTER — Other Ambulatory Visit: Payer: Self-pay | Admitting: *Deleted

## 2018-05-30 ENCOUNTER — Encounter: Payer: Self-pay | Admitting: *Deleted

## 2018-05-30 DIAGNOSIS — N189 Chronic kidney disease, unspecified: Secondary | ICD-10-CM | POA: Diagnosis not present

## 2018-05-30 DIAGNOSIS — J449 Chronic obstructive pulmonary disease, unspecified: Secondary | ICD-10-CM | POA: Diagnosis not present

## 2018-05-30 DIAGNOSIS — J90 Pleural effusion, not elsewhere classified: Secondary | ICD-10-CM | POA: Diagnosis not present

## 2018-05-30 DIAGNOSIS — I5043 Acute on chronic combined systolic (congestive) and diastolic (congestive) heart failure: Secondary | ICD-10-CM | POA: Diagnosis not present

## 2018-05-30 LAB — CULTURE, BODY FLUID-BOTTLE: CULTURE: NO GROWTH

## 2018-05-30 LAB — CULTURE, BODY FLUID W GRAM STAIN -BOTTLE

## 2018-05-30 NOTE — Patient Outreach (Signed)
[facility]) Care Management  05/30/2018  John Peck [birth year] [id number]   CSW was able to make initial contact with patient today to perform the assessment, as well as assess and assist with social work needs and services, when [facility] met with patient at [facility] [place] where patient currently resides to receive short-term rehabilitative services.  Also present at the time of CSW's visit was patient's daughter, John Peck.  CSW introduced self, explained role and types of services provided through [facility] ([facility]).  CSW further explained to patient and John Peck that [facility] works with John Peck, [facility], also with [facility]. CSW then explained the reason for the visit, indicating that John Peck thought that patient would benefit from social work services and resources to assist with discharge planning needs and services from the skilled nursing facility.  CSW obtained two HIPAA compliant identifiers from patient, which included patient's name and date of birth. Patient reported that his first day working with therapies (both physical and occupational) was yesterday, and that he experienced minimal pain.  Patient admits to being very weak, fatigued and short-of-breath when trying to ambulate with his rolling walker.  Patient indicated that he is only able to ambulate a few feet before needing to sit down.  Patient endorses that he plans to return home to live with his wife at time of discharge from the skilled nursing facility.  CSW reminded patient and John Peck that John Peck has already made arrangements for patient to receive in-home caregiver services through [facility], upon patient's return home.  CSW agreed to notify John Peck, representative with [facility], when patient has a discharge date scheduled, as CSW will contact John Peck to begin in-home care services.  John Peck  already has the order and all of patient's basic demographic information.  Patient and John Peck admitted that they are currently paying for in-home care services, out-of-pocket, for both parents. Patient and John Peck did not wish to speak about long-term care services for patient and his wife in an assisted living facility.  John Peck admits that she and her sister are willing to "do whatever it takes" to keep patient and wife in the comfort of their own home.  However, they do realize at some point that it may be beneficial to have Long-Term Care Medicaid in place, having already applied with the [employer].  CSW agreed to assist with that process, if necessary.  CSW agreed to follow-up with patient again next week to assess and assist with discharge planning needs and services.  CSW was able to ensure that patient and John Peck have the right contact information for CSW, encouraging them to contact CSW directly if additional social work needs arise in the meantime.  Patient and John Peck voiced understanding and were agreeable to this plan.

## 2018-06-01 DIAGNOSIS — D649 Anemia, unspecified: Secondary | ICD-10-CM | POA: Diagnosis not present

## 2018-06-01 DIAGNOSIS — I5043 Acute on chronic combined systolic (congestive) and diastolic (congestive) heart failure: Secondary | ICD-10-CM | POA: Diagnosis not present

## 2018-06-01 DIAGNOSIS — J449 Chronic obstructive pulmonary disease, unspecified: Secondary | ICD-10-CM | POA: Diagnosis not present

## 2018-06-01 DIAGNOSIS — J189 Pneumonia, unspecified organism: Secondary | ICD-10-CM | POA: Diagnosis not present

## 2018-06-02 ENCOUNTER — Emergency Department (HOSPITAL_COMMUNITY): Payer: Medicare HMO

## 2018-06-02 ENCOUNTER — Encounter (HOSPITAL_COMMUNITY): Payer: Self-pay | Admitting: Emergency Medicine

## 2018-06-02 ENCOUNTER — Emergency Department (HOSPITAL_COMMUNITY)
Admission: EM | Admit: 2018-06-02 | Discharge: 2018-06-03 | Disposition: A | Discharge status: Home / Self Care | Payer: Medicare HMO | Attending: Emergency Medicine | Admitting: Emergency Medicine

## 2018-06-02 ENCOUNTER — Other Ambulatory Visit: Payer: Self-pay

## 2018-06-02 DIAGNOSIS — N3090 Cystitis, unspecified without hematuria: Secondary | ICD-10-CM | POA: Diagnosis not present

## 2018-06-02 DIAGNOSIS — E039 Hypothyroidism, unspecified: Secondary | ICD-10-CM | POA: Insufficient documentation

## 2018-06-02 DIAGNOSIS — I5042 Chronic combined systolic (congestive) and diastolic (congestive) heart failure: Secondary | ICD-10-CM | POA: Insufficient documentation

## 2018-06-02 DIAGNOSIS — I130 Hypertensive heart and chronic kidney disease with heart failure and stage 1 through stage 4 chronic kidney disease, or unspecified chronic kidney disease: Secondary | ICD-10-CM | POA: Insufficient documentation

## 2018-06-02 DIAGNOSIS — N3000 Acute cystitis without hematuria: Secondary | ICD-10-CM

## 2018-06-02 DIAGNOSIS — Z7901 Long term (current) use of anticoagulants: Secondary | ICD-10-CM | POA: Insufficient documentation

## 2018-06-02 DIAGNOSIS — I2510 Atherosclerotic heart disease of native coronary artery without angina pectoris: Secondary | ICD-10-CM | POA: Insufficient documentation

## 2018-06-02 DIAGNOSIS — R258 Other abnormal involuntary movements: Secondary | ICD-10-CM | POA: Diagnosis not present

## 2018-06-02 DIAGNOSIS — Z7982 Long term (current) use of aspirin: Secondary | ICD-10-CM | POA: Diagnosis not present

## 2018-06-02 DIAGNOSIS — Z87891 Personal history of nicotine dependence: Secondary | ICD-10-CM | POA: Insufficient documentation

## 2018-06-02 DIAGNOSIS — Z950 Presence of cardiac pacemaker: Secondary | ICD-10-CM | POA: Insufficient documentation

## 2018-06-02 DIAGNOSIS — N183 Chronic kidney disease, stage 3 (moderate): Secondary | ICD-10-CM | POA: Insufficient documentation

## 2018-06-02 DIAGNOSIS — J90 Pleural effusion, not elsewhere classified: Secondary | ICD-10-CM | POA: Diagnosis not present

## 2018-06-02 DIAGNOSIS — J449 Chronic obstructive pulmonary disease, unspecified: Secondary | ICD-10-CM | POA: Insufficient documentation

## 2018-06-02 DIAGNOSIS — R400 Somnolence: Secondary | ICD-10-CM | POA: Diagnosis not present

## 2018-06-02 DIAGNOSIS — R4182 Altered mental status, unspecified: Secondary | ICD-10-CM | POA: Diagnosis not present

## 2018-06-02 DIAGNOSIS — R531 Weakness: Secondary | ICD-10-CM | POA: Diagnosis not present

## 2018-06-02 DIAGNOSIS — Z79899 Other long term (current) drug therapy: Secondary | ICD-10-CM | POA: Insufficient documentation

## 2018-06-02 LAB — CBC WITH DIFFERENTIAL/PLATELET
Abs Immature Granulocytes: 0 10*3/uL (ref 0.0–0.1)
Basophils Absolute: 0 10*3/uL (ref 0.0–0.1)
Basophils Relative: 0 %
Eosinophils Absolute: 0.1 10*3/uL (ref 0.0–0.7)
Eosinophils Relative: 3 %
HCT: 31.2 % — ABNORMAL LOW (ref 39.0–52.0)
Hemoglobin: 10.1 g/dL — ABNORMAL LOW (ref 13.0–17.0)
Immature Granulocytes: 0 %
Lymphocytes Relative: 14 %
Lymphs Abs: 0.6 10*3/uL — ABNORMAL LOW (ref 0.7–4.0)
MCH: 32 pg (ref 26.0–34.0)
MCHC: 32.4 g/dL (ref 30.0–36.0)
MCV: 98.7 fL (ref 78.0–100.0)
Monocytes Absolute: 0.8 10*3/uL (ref 0.1–1.0)
Monocytes Relative: 19 %
Neutro Abs: 2.6 10*3/uL (ref 1.7–7.7)
Neutrophils Relative %: 64 %
Platelets: 123 10*3/uL — ABNORMAL LOW (ref 150–400)
RBC: 3.16 MIL/uL — ABNORMAL LOW (ref 4.22–5.81)
RDW: 16.8 % — ABNORMAL HIGH (ref 11.5–15.5)
WBC: 4.1 10*3/uL (ref 4.0–10.5)

## 2018-06-02 LAB — URINALYSIS, ROUTINE W REFLEX MICROSCOPIC
Bilirubin Urine: NEGATIVE
Glucose, UA: NEGATIVE mg/dL
Ketones, ur: NEGATIVE mg/dL
Nitrite: NEGATIVE
Protein, ur: NEGATIVE mg/dL
Specific Gravity, Urine: 1.016 (ref 1.005–1.030)
WBC, UA: >50 WBC/hpf — ABNORMAL HIGH (ref 0–5)
pH: 7 (ref 5.0–8.0)

## 2018-06-02 LAB — BASIC METABOLIC PANEL
Anion gap: 7 (ref 5–15)
BUN: 25 mg/dL — ABNORMAL HIGH (ref 8–23)
CO2: 27 mmol/L (ref 22–32)
Calcium: 8 mg/dL — ABNORMAL LOW (ref 8.9–10.3)
Chloride: 108 mmol/L (ref 98–111)
Creatinine, Ser: 1.31 mg/dL — ABNORMAL HIGH (ref 0.61–1.24)
GFR calc Af Amer: 53 mL/min — ABNORMAL LOW (ref >60)
GFR calc non Af Amer: 46 mL/min — ABNORMAL LOW (ref >60)
Glucose, Bld: 115 mg/dL — ABNORMAL HIGH (ref 70–99)
Potassium: 4.9 mmol/L (ref 3.5–5.1)
Sodium: 142 mmol/L (ref 135–145)

## 2018-06-02 LAB — BRAIN NATRIURETIC PEPTIDE: B Natriuretic Peptide: 627.9 pg/mL — ABNORMAL HIGH (ref 0.0–100.0)

## 2018-06-02 MED ORDER — CEPHALEXIN 500 MG PO CAPS: 500.0000 mg | ORAL_CAPSULE | Freq: Two times a day (BID) | ORAL | 0 refills | Status: DC

## 2018-06-02 MED ORDER — CEPHALEXIN 250 MG PO CAPS
500.0000 mg | ORAL_CAPSULE | Freq: Once | ORAL | Status: AC
  Administered 2018-06-02: 500 mg via ORAL
  Filled 2018-06-02: qty 2

## 2018-06-02 NOTE — ED Triage Notes (Signed)
Per [facility] pt coming from [facility] and rehab where he was discharged to after recent admission. Family asked patient be sent to ER for evaluation of increased lethargy today along with twitching in bilateral arms. Pt has hx of stroke. Given 200CC NS en route. Patient has no complaints.

## 2018-06-02 NOTE — ED Provider Notes (Signed)
[facility] EMERGENCY DEPARTMENT Provider Note   CSN: [id number] Arrival date & time: 06/02/18  1505     History   Chief Complaint No chief complaint on file.   HPI John Peck is a 84 y.o. male.  HPI   84 year old male brought in for evaluation of decreased mental status.  Noticed this morning.  Family states that very quiet and not very active which is unusual for him.  Patient himself has no acute complaints.  Denies any acute pain.  No fevers.  No acute respiratory complaints.  Past Medical History:  Diagnosis Date  . Anemia   . Arthritis   . CAD (coronary artery disease)    a. CABG '89; b. PCI '04; c. 11/2015 Cath/PCI: LM 20ost, LAD 175m, LCX 30p, OM2 40, RCA 30p, 10p ISR, 90d (3.0x12 Synergy DES), AM 90, VG->OM2 known to be 100, LIMA->LAD not injected, patent in 2015.  . Carotid stenosis, right   . Chronic combined systolic and diastolic CHF (congestive heart failure) ([place])   . CKD (chronic kidney disease), stage III ([place])   . H/O: GI bleed    REMOTE HISTORY  . History of COPD   . Hyperlipidemia   . Hypertensive heart disease   . LBBB (left bundle branch block)   . Pacemaker Sept 2009   St Jude  . PAF (paroxysmal atrial fibrillation) (HCC)    a. not on anticoag due to prior GIB.  . Sick sinus syndrome [facility]) Sept 2009   ST Jude PTVDP  . Stroke [facility])     Patient Active Problem List   Diagnosis Date Noted  . Pressure injury of skin 05/25/2018  . Pleural effusion 05/24/2018  . Acute respiratory failure with hypoxia ([place]) 05/24/2018  . Acute on chronic combined systolic and diastolic CHF (congestive heart failure) ([place]) 03/14/2018  . COPD clinical dx/ no pfts on record 03/14/2018  . H/O: GI bleed 03/14/2018  . CKD (chronic kidney disease), stage III ([place]) 03/14/2018  . Paroxysmal atrial fibrillation ([place]) 03/14/2018  . Hypothyroidism 03/14/2018  . Carotid stenosis, right 03/14/2018  . Facial droop 02/22/2018  . Pancytopenia ([place])  02/22/2018  . Generalized weakness   . Hypertensive heart disease   . Hyperlipidemia   . Stented coronary artery   . Coronary artery disease involving native coronary artery of native heart with unstable angina pectoris ([place])   . Reactive airway disease 11/27/2015  . CAD in native artery 09/03/2015  . Weakness 05/09/2015  . Hyperlipidemia LDL goal <70 02/28/2015  . Atherosclerosis of lower extremity with claudication ([place]) 07/17/2014  . PAF (paroxysmal atrial fibrillation) ([place]) 03/24/2013  . Lower extremity edema 03/24/2013  . Sinoatrial node dysfunction ([place]) 08/17/2012  . History of iron deficiency anemia 09/18/2011  . Essential hypertension 07/07/2009  . INTERMEDIATE CORONARY SYNDROME 07/07/2009  . S/P CABG x 2 '89. RCA stent'04. last cath Jan 2013 07/07/2009  . PACEMAKER, PERMANENT- St Jude Sept 2009 07/07/2009  . Sick sinus syndrome ([place]) 07/10/2008  . Pacemaker 07/10/2008    Past Surgical History:  Procedure Laterality Date  . CARDIAC CATHETERIZATION  11/2011   EF 50%; significant native CAD w/80% stenosis of the LAD after 2nd giagonal vessel and septal perforating artery w/total occlusion of the mid left anterior descendiung.; 60-70% ostiasl stenosis in the circumflex vessel followed by 40% proximal stenosis and 70-80% distal circumflex stenosis;   . CARDIAC CATHETERIZATION N/A 11/29/2015   Procedure: Left Heart Cath and Cors/Grafts Angiography;  Surgeon: Burnell Blanks, MD;  Location: [facility] CV LAB;  Service: Cardiovascular;  Laterality: N/A;  . CARDIAC CATHETERIZATION N/A 11/29/2015   Procedure: Coronary Stent Intervention;  Surgeon: Burnell Blanks, MD;  Location: [place] [facility] CV LAB;  Service: Cardiovascular;  Laterality: N/A;  . CATARACT EXTRACTION  2004  . CORONARY ANGIOGRAPHY N/A 02/25/2017   Procedure: Coronary Angiography;  Surgeon: Belva Crome, MD;  Location: [facility] CV LAB;  Service: Cardiovascular;  Laterality: N/A;  . CORONARY ANGIOPLASTY  WITH STENT PLACEMENT  2004   RCA  . CORONARY ARTERY BYPASS GRAFT  1989   had LIMA to his LAD, a vein to the circumflex. In 2004 underwent stenting to his right coronary artery.  . CORONARY STENT INTERVENTION N/A 02/24/2017   Procedure: Coronary Stent Intervention;  Surgeon: Wellington Hampshire, MD;  Location: [facility] CV LAB;  Service: Cardiovascular;  Laterality: N/A;  cfx  . HEMORRHOID SURGERY    . INSERT / REPLACE / REMOVE PACEMAKER  07/17/08   DUAL-CHAMBER; PPM-ST.JUDE MEDNET  . IR THORACENTESIS ASP PLEURAL SPACE W/IMG GUIDE  05/25/2018  . LEFT HEART CATH AND CORS/GRAFTS ANGIOGRAPHY N/A 02/24/2017   Procedure: Left Heart Cath and Cors/Grafts Angiography;  Surgeon: Wellington Hampshire, MD;  Location: [facility] CV LAB;  Service: Cardiovascular;  Laterality: N/A;  . LEFT HEART CATHETERIZATION WITH CORONARY/GRAFT ANGIOGRAM N/A 12/10/2011   Procedure: LEFT HEART CATHETERIZATION WITH Beatrix Fetters;  Surgeon: Troy Sine, MD;  Location: [facility] CATH LAB;  Service: Cardiovascular;  Laterality: N/A;  . LEFT HEART CATHETERIZATION WITH CORONARY/GRAFT ANGIOGRAM N/A 01/26/2014   Procedure: LEFT HEART CATHETERIZATION WITH Beatrix Fetters;  Surgeon: Troy Sine, MD;  Location: [facility] CATH LAB;  Service: Cardiovascular;  Laterality: N/A;  . PPM GENERATOR CHANGEOUT N/A 12/15/2017   Procedure: PPM GENERATOR CHANGEOUT;  Surgeon: Evans Lance, MD;  Location: [facility] CV LAB;  Service: Cardiovascular;  Laterality: N/A;  . PROSTATECTOMY          Home Medications    Prior to Admission medications   Medication Sig Start Date End Date Taking? Authorizing Provider  albuterol (PROVENTIL HFA;VENTOLIN HFA) 108 (90 Base) MCG/ACT inhaler Inhale 2 puffs into the lungs every 4 (four) hours as needed for wheezing or shortness of breath (or coughing). 05/27/18  Yes Emokpae, Courage, MD  ALPRAZolam (XANAX) 0.25 MG tablet Take 1 tablet (0.25 mg total) by mouth 2 (two) times daily as needed for anxiety.  05/27/18  Yes Emokpae, Courage, MD  aspirin 81 MG tablet Take 81 mg by mouth daily.    Yes [provider]  atorvastatin (LIPITOR) 40 MG tablet Take 1 tablet (40 mg total) by mouth daily. 09/18/15  Yes Troy Sine, MD  carvedilol (COREG) 12.5 MG tablet Take 1 tablet (12.5 mg total) by mouth 2 (two) times daily with a meal. 05/27/18  Yes Emokpae, Courage, MD  clopidogrel (PLAVIX) 75 MG tablet Take 1 tablet (75 mg total) by mouth daily. 05/27/18  Yes Emokpae, Courage, MD  ezetimibe (ZETIA) 10 MG tablet Take 10 mg by mouth daily.   Yes [provider]  ferrous sulfate 325 (65 FE) MG tablet Take 1 tablet (325 mg total) by mouth 2 (two) times daily with a meal. 05/27/18  Yes Emokpae, Courage, MD  furosemide (LASIX) 20 MG tablet Take 2 tablets (40 mg total) by mouth 2 (two) times daily. 05/27/18 05/27/19 Yes Emokpae, Courage, MD  guaiFENesin (MUCINEX) 600 MG 12 hr tablet Take 1 tablet (600 mg total) by mouth 2 (two) times daily for  10 days. 05/27/18 06/06/18 Yes Emokpae, Courage, MD  ipratropium-albuterol (DUONEB) 0.5-2.5 (3) MG/3ML SOLN Take 3 mLs by nebulization 2 (two) times daily. 02/24/18  Yes Mikhail, Velta Addison, DO  isosorbide mononitrate (IMDUR) 30 MG 24 hr tablet Take 3 tablets (90 mg total) by mouth daily. 05/28/18  Yes Emokpae, Courage, MD  latanoprost (XALATAN) 0.005 % ophthalmic solution Place 1 drop into both eyes 2 (two) times a week.  11/07/15  Yes [provider]  levothyroxine (SYNTHROID, LEVOTHROID) 125 MCG tablet TAKE 1 TABLET BY MOUTH EVERY DAY BEFORE BREAKFAST 04/05/18  Yes Troy Sine, MD  pantoprazole (PROTONIX) 40 MG tablet Take 1 tablet (40 mg total) by mouth daily. 05/28/18  Yes Emokpae, Courage, MD  polyethylene glycol (MIRALAX / GLYCOLAX) packet Take 17 g by mouth daily. 05/27/18  Yes Emokpae, Courage, MD  ranolazine (RANEXA) 1000 MG SR tablet Take 1 tablet (1,000 mg total) by mouth 2 (two) times daily. 05/27/18  Yes Roxan Hockey, MD  senna (SENOKOT) 8.6 MG  tablet Take 2 tablets by mouth 2 (two) times daily as needed for constipation.   Yes [provider]  tamsulosin (FLOMAX) 0.4 MG CAPS capsule Take 1 capsule (0.4 mg total) by mouth daily. 05/27/18  Yes Emokpae, Courage, MD  azithromycin (ZITHROMAX) 500 MG tablet Take 1 tablet (500 mg total) by mouth daily. Patient not taking: Reported on 06/02/2018 05/27/18   Roxan Hockey, MD  isosorbide mononitrate (IMDUR) 30 MG 24 hr tablet Take 1 tablet (30 mg total) by mouth at bedtime. Patient not taking: Reported on 06/02/2018 05/27/18   Roxan Hockey, MD    Family History Family History  Problem Relation Age of Onset  . Diabetes Father   . Stroke Sister   . Heart disease Sister   . Heart disease Brother   . Heart attack Brother   . Stroke Brother   . Hypertension Neg Hx     Social History Social History   Tobacco Use  . Smoking status: Former Smoker    Packs/day: 0.75    Years: 65.00    Pack years: 48.75    Types: Cigarettes    Last attempt to quit: 11/09/1997    Years since quitting: 20.5  . Smokeless tobacco: Former Systems developer    Quit date: 10/29/2008  Substance Use Topics  . Alcohol use: No  . Drug use: No     Allergies   Patient has no known allergies.   Review of Systems Review of Systems  All systems reviewed and negative, other than as noted in HPI.  Physical Exam Updated Vital Signs BP (!) 112/54   Pulse (!) 59   Temp 98 F (36.7 C) (Oral)   Resp (!) 22   Ht 5\' 9"  (1.753 m)   Wt 71.2 kg (157 lb)   SpO2 98%   BMI 23.18 kg/m   Physical Exam  Constitutional: He appears well-developed and well-nourished. No distress.  HENT:  Head: Normocephalic and atraumatic.  Eyes: Conjunctivae are normal. Right eye exhibits no discharge. Left eye exhibits no discharge.  Neck: Neck supple.  Cardiovascular: Normal rate, regular rhythm and normal heart sounds. Exam reveals no gallop and no friction rub.  No murmur heard. Pulmonary/Chest: Effort normal and breath  sounds normal. No respiratory distress.  Abdominal: Soft. He exhibits no distension. There is no tenderness.  Musculoskeletal: He exhibits no edema or tenderness.  Neurological:  Drowsy.  Opens eyes to voice.  Speech is clear.  No focal motor deficits.  Skin: Skin is warm  and dry.  Psychiatric: He has a normal mood and affect. His behavior is normal. Thought content normal.  Nursing note and vitals reviewed.    ED Treatments / Results  Labs (all labs ordered are listed, but only abnormal results are displayed) Labs Reviewed  CBC WITH DIFFERENTIAL/PLATELET - Abnormal; Notable for the following components:      Result Value   RBC 3.16 (*)    Hemoglobin 10.1 (*)    HCT 31.2 (*)    RDW 16.8 (*)    Platelets 123 (*)    Lymphs Abs 0.6 (*)    All other components within normal limits  BASIC METABOLIC PANEL - Abnormal; Notable for the following components:   Glucose, Bld 115 (*)    BUN 25 (*)    Creatinine, Ser 1.31 (*)    Calcium 8.0 (*)    GFR calc non Af Amer 46 (*)    GFR calc Af Amer 53 (*)    All other components within normal limits  BRAIN NATRIURETIC PEPTIDE - Abnormal; Notable for the following components:   B Natriuretic Peptide 627.9 (*)    All other components within normal limits  URINALYSIS, ROUTINE W REFLEX MICROSCOPIC - Abnormal; Notable for the following components:   APPearance HAZY (*)    Hgb urine dipstick SMALL (*)    Leukocytes, UA LARGE (*)    WBC, UA >50 (*)    Bacteria, UA MANY (*)    All other components within normal limits    EKG EKG Interpretation  Date/Time:  Thursday June 02 2018 15:12:43 EDT Ventricular Rate:  60 PR Interval:    QRS Duration: 165 QT Interval:  502 QTC Calculation: 502 R Axis:   -61 Text Interpretation:  Ventricular-paced rhythm No further analysis attempted due to paced rhythm Baseline wander in lead(s) II III aVF Confirmed by Virgel Manifold [phone]) on 06/02/2018 3:59:59 PM   Radiology Dg Chest 2 View  Result Date:  06/02/2018 CLINICAL DATA:  Dyspnea. EXAM: CHEST - 2 VIEW COMPARISON:  05/25/2018 FINDINGS: A left subclavian approach pacemaker remains in place with leads terminating over the right atrium and right ventricle. Sequelae of prior CABG are again identified. The cardiac silhouette is borderline to mildly enlarged. A small right pleural effusion is stable to slightly larger. There is at most a trace left pleural effusion. There is mild diffuse prominence of the interstitial markings with mildly increased hazy opacity in the right lung base and unchanged mild patchy opacity in the left lung base. Mild asymmetric right apical opacity is similar to the prior study. No pneumothorax is identified. No acute osseous abnormality is seen. IMPRESSION: 1. Small right pleural effusion, stable to slightly increased from prior. 2. Mildly increased right and unchanged left basilar opacities which may reflect atelectasis. 3. Mild diffuse interstitial prominence for which mild interstitial edema is not excluded. Electronically Signed   By: Logan Bores M.D.   On: 06/02/2018 17:05    Procedures Procedures (including critical care time)  Medications Ordered in ED Medications - No data to display   Initial Impression / Assessment and Plan / ED Course  I have reviewed the triage vital signs and the nursing notes.  Pertinent labs & imaging results that were available during my care of the patient were reviewed by me and considered in my medical decision making (see chart for details).     84 year old male with change in mental status which is primarily increased drowsiness/generalized weakness.  UA is consistent with UTI.  I feel is appropriate for outpatient treatment.  Findings are discussed with his family at bedside.  Final Clinical Impressions(s) / ED Diagnoses   Final diagnoses:  Acute cystitis without hematuria    ED Discharge Orders        Ordered    cephALEXin (KEFLEX) 500 MG capsule  2 times daily      06/02/18 2230    cephALEXin (KEFLEX) 500 MG capsule  2 times daily     06/02/18 2250       Virgel Manifold, MD 06/05/18 2337

## 2018-06-03 DIAGNOSIS — G629 Polyneuropathy, unspecified: Secondary | ICD-10-CM | POA: Diagnosis not present

## 2018-06-03 DIAGNOSIS — R531 Weakness: Secondary | ICD-10-CM | POA: Diagnosis not present

## 2018-06-03 DIAGNOSIS — N3000 Acute cystitis without hematuria: Secondary | ICD-10-CM | POA: Diagnosis not present

## 2018-06-03 DIAGNOSIS — J189 Pneumonia, unspecified organism: Secondary | ICD-10-CM | POA: Diagnosis not present

## 2018-06-05 LAB — URINE CULTURE: Culture: >=100000 — AB

## 2018-06-06 ENCOUNTER — Telehealth: Payer: Self-pay | Admitting: Emergency Medicine

## 2018-06-06 ENCOUNTER — Other Ambulatory Visit: Payer: Self-pay | Admitting: *Deleted

## 2018-06-06 NOTE — Patient Outreach (Signed)
[facility]) Care Management  06/06/2018  John Peck July 05, 1924 [id number]  CSW drove out to [facility] today, [place] where patient currently resides to receive short-term rehabilitative services, to perform a routine visit; however, patient was not available at the time of CSW's arrival.  CSW was informed by patient's attending nurse that patient just left for therapy (physical and/or occupational) and that he would be gone for at least one hour. CSW made an attempt to try and contact patient's wife, John Peck today to discuss discharge planning arrangements for patient; however, [name] was not available at the time of CSW's call.  A HIPAA compliant message was left for [name] on voicemail.  CSW also tried calling patient's daughter, John Peck, but she too, was unavailable.  A HIPAA compliant message was also left for John Peck. Nat Christen, BSW, MSW, LCSW  Licensed Education officer, environmental Health System  Mailing [place] [address], [place], [place] [address] Physical Address-[address], [place], [place] [address] Toll Free Main # [phone] Fax # [phone] Cell # [phone]  Office # [phone] [email or web address]

## 2018-06-06 NOTE — Patient Outreach (Signed)
[facility]) Care Management  06/06/2018  John Peck 1924-06-19 [id number]   CSW received a return call from patient's daughter, John Peck today in regards to the HIPAA compliant message left for Ms. Strain on voicemail by CSW, earlier in the day.  [name] reports that patient is scheduled to be discharged from [facility] [place] where patient currently resides to receive short-term rehabilitative services, within the next week and she is not sure what they are planning to do?  [name] admits that she, nor patient's wife, John Peck are able to care for patient in the home in his current condition, believing that patient would really benefit from 24 hour care and supervision.    [name] was advised to contact the social worker at [facility] to schedule a meeting to discuss possible long-term care placement arrangements for patient.  CSW also encouraged [name] to go ahead and apply for Long-Term Care Medicaid for patient through the [facility], as [name] indicated that she is very concerned about how they would pay for patient to remain at [facility] long-term.  CSW provided [name] with a list of information that she will need to take with her when she goes to apply.  CSW requested that [name] contact CSW as soon as she has a meeting scheduled with the social worker at [facility], as CSW would like to make arrangements to attend.  CSW reminded [name] that arrangements have already been made for patient to receive in-home care services through [facility], but [name] really believes that patient will require 24 hour care and supervision.  [name] stated, "If he has to return home, then we will definitely need those services in place, but we will try to avoid having him come home, if at all possible".  CSW voiced understanding and was agreeable to patient needing  long-term care placement, either at [facility] or in another skilled nursing facility of their choice.  CSW will await a return call from [name], but if a return call is not received within the next week then CSW will make an outreach attempt.    Nat Christen, BSW, MSW, LCSW  Licensed Education officer, environmental Health System  Mailing [place] [address], [place], [place] [address] Physical Address-[address], [place], [place] [address] Toll Free Main # [phone] Fax # [phone] Cell # [phone]  Office # [phone] [email or web address]

## 2018-06-06 NOTE — Telephone Encounter (Signed)
Post ED Visit - Positive Culture Follow-up  Culture report reviewed by antimicrobial stewardship pharmacist:  []  Elenor Quinones, Pharm.D. []  Heide Guile, Pharm.D., BCPS AQ-ID []  Parks Neptune, Pharm.D., BCPS []  Alycia Rossetti, Pharm.D., BCPS []  [place], Pharm.D., BCPS, AAHIVP []  Legrand Como, Pharm.D., BCPS, AAHIVP [x]  Salome Arnt, PharmD, BCPS []  Johnnette Gourd, PharmD, BCPS []  Hughes Better, PharmD, BCPS []  Leeroy Cha, PharmD  Positive urine culture Treated with cephalexin, organism sensitive to the same and no further patient follow-up is required at this time.  Hazle Nordmann 06/06/2018, 11:30 AM

## 2018-06-13 ENCOUNTER — Other Ambulatory Visit: Payer: Self-pay | Admitting: *Deleted

## 2018-06-13 DIAGNOSIS — R300 Dysuria: Secondary | ICD-10-CM | POA: Diagnosis not present

## 2018-06-13 DIAGNOSIS — N400 Enlarged prostate without lower urinary tract symptoms: Secondary | ICD-10-CM | POA: Diagnosis not present

## 2018-06-13 NOTE — Patient Outreach (Signed)
[facility]) Care Management  06/13/2018  John Peck 1924/06/30 [id number]   CSW was able to meet with patient today at [facility] [place] where patient currently resides to receive short-term rehabilitative services.  Patient was lying in his bed watching television at the time of CSW's arrival.  Patient admitted that he has not yet worked with therapies today, both physical and/or occupational.  Patient reported that he was waiting for his wife and grandson to arrive, as they try to visit patient on a daily basis.  Patient did not appear to be confused today, just not very talkative with CSW.  CSW made an attempt to try and contact patient's daughter, Fain Francis today to discuss discharge planning arrangements for patient; however, Ms. Sheriff was not available at the time of CSW's call.  A HIPAA compliant message was left on voicemail for Ms. Leeb and CSW is currently awaiting a return call.  Last time CSW spoke with Ms. Ingman, she indicated that she and patient's wife are contemplating long-term care placement arrangements for patient, as Ms. Grunden reported that patient's wife is unable to care for patient in his current medical condition.  CSW also stopped by the social work department at [facility] to try and speak with patient's Social Worker, Cathlean Sauer, but CSW was told that she was in a meeting.  CSW left a HIPAA compliant message for Mrs. Bertell Maria on voicemail and is currently awaiting a return call.  CSW left a HIPAA compliant message for Mrs. Bertell Maria last week, indicating that she really needed to speak with patient and family about patient's discharge plans, as they are somewhat up in the air at this point.  CSW explained to Mrs. Bertell Maria that she may need to assist patient and patient's wife with completion of a Long-Term Care Medicaid application through the [place].  CSW will follow-up with  patient again next week to assess and assist with discharge planning needs and services.  CSW will talk with patient, patient's wife and patient's daughter about patient's plan of care at time of discharge from [facility].  It is still undetermined as to whether or not patient will reside at [facility], or another skilled nursing facility of choice, for long-term care services, or return home to live with wife with home care services provided through [facility].  Nat Christen, BSW, MSW, LCSW  Licensed Education officer, [facility]  Mailing [place] [address], [place], [place] [address] Physical Address-[address], [place], [place] [address] Toll Free Main # [phone] Fax # [phone] Cell # [phone]  Office # [phone] [email or web address]

## 2018-06-14 ENCOUNTER — Other Ambulatory Visit: Payer: Self-pay

## 2018-06-14 NOTE — Consult Note (Signed)
This Probation officer received a Dance movement psychotherapist from [place] at [facility] that they will close the case that was requested and that [facility] social worker, Verdie Drown can re-refer if needs are warranted.  Notified Di Kindle of information.   Natividad Brood, RN BSN [facility] Liaison  [phone] business mobile phone Toll free office [phone]

## 2018-06-16 DIAGNOSIS — D649 Anemia, unspecified: Secondary | ICD-10-CM | POA: Diagnosis not present

## 2018-06-16 DIAGNOSIS — R300 Dysuria: Secondary | ICD-10-CM | POA: Diagnosis not present

## 2018-06-16 DIAGNOSIS — R05 Cough: Secondary | ICD-10-CM | POA: Diagnosis not present

## 2018-06-17 DIAGNOSIS — I509 Heart failure, unspecified: Secondary | ICD-10-CM | POA: Diagnosis not present

## 2018-06-17 DIAGNOSIS — J90 Pleural effusion, not elsewhere classified: Secondary | ICD-10-CM | POA: Diagnosis not present

## 2018-06-20 ENCOUNTER — Other Ambulatory Visit: Payer: Self-pay | Admitting: *Deleted

## 2018-06-20 DIAGNOSIS — I959 Hypotension, unspecified: Secondary | ICD-10-CM | POA: Diagnosis not present

## 2018-06-20 DIAGNOSIS — J90 Pleural effusion, not elsewhere classified: Secondary | ICD-10-CM | POA: Diagnosis not present

## 2018-06-20 NOTE — Patient Outreach (Signed)
[facility]) Care Management  06/20/2018  John Peck 1924/06/04 [id number]   This CSW contacted [facility] today and was able to speak with Lilia Pro, SNF rep, who reports pt is progressing well with therapy however "still needs 24 hour care". Per SNF rep, pt and family are considering long term placement and have applied for Medicaid.  [employer], LCSW, assigned CSW,to be updated upon her return next week for further updates and determination of plans/needs.   Eduard Clos, MSW, [place] Worker  [place] [phone]

## 2018-06-23 DIAGNOSIS — J449 Chronic obstructive pulmonary disease, unspecified: Secondary | ICD-10-CM | POA: Diagnosis not present

## 2018-06-23 DIAGNOSIS — I509 Heart failure, unspecified: Secondary | ICD-10-CM | POA: Diagnosis not present

## 2018-06-23 DIAGNOSIS — J90 Pleural effusion, not elsewhere classified: Secondary | ICD-10-CM | POA: Diagnosis not present

## 2018-06-23 DIAGNOSIS — R630 Anorexia: Secondary | ICD-10-CM | POA: Diagnosis not present

## 2018-06-25 ENCOUNTER — Emergency Department (HOSPITAL_COMMUNITY): Payer: Medicare HMO

## 2018-06-25 ENCOUNTER — Other Ambulatory Visit: Payer: Self-pay

## 2018-06-25 ENCOUNTER — Encounter (HOSPITAL_COMMUNITY): Payer: Self-pay | Admitting: Emergency Medicine

## 2018-06-25 ENCOUNTER — Emergency Department (HOSPITAL_COMMUNITY)
Admission: EM | Admit: 2018-06-25 | Discharge: 2018-06-26 | Disposition: A | Discharge status: Home / Self Care | Payer: Medicare HMO | Attending: Emergency Medicine | Admitting: Emergency Medicine

## 2018-06-25 DIAGNOSIS — I130 Hypertensive heart and chronic kidney disease with heart failure and stage 1 through stage 4 chronic kidney disease, or unspecified chronic kidney disease: Secondary | ICD-10-CM | POA: Diagnosis not present

## 2018-06-25 DIAGNOSIS — Z7902 Long term (current) use of antithrombotics/antiplatelets: Secondary | ICD-10-CM | POA: Insufficient documentation

## 2018-06-25 DIAGNOSIS — I2510 Atherosclerotic heart disease of native coronary artery without angina pectoris: Secondary | ICD-10-CM | POA: Insufficient documentation

## 2018-06-25 DIAGNOSIS — Z950 Presence of cardiac pacemaker: Secondary | ICD-10-CM | POA: Diagnosis not present

## 2018-06-25 DIAGNOSIS — Z7982 Long term (current) use of aspirin: Secondary | ICD-10-CM | POA: Insufficient documentation

## 2018-06-25 DIAGNOSIS — Z8673 Personal history of transient ischemic attack (TIA), and cerebral infarction without residual deficits: Secondary | ICD-10-CM | POA: Diagnosis not present

## 2018-06-25 DIAGNOSIS — Z951 Presence of aortocoronary bypass graft: Secondary | ICD-10-CM | POA: Diagnosis not present

## 2018-06-25 DIAGNOSIS — G253 Myoclonus: Secondary | ICD-10-CM | POA: Diagnosis not present

## 2018-06-25 DIAGNOSIS — N183 Chronic kidney disease, stage 3 (moderate): Secondary | ICD-10-CM | POA: Diagnosis not present

## 2018-06-25 DIAGNOSIS — I5042 Chronic combined systolic (congestive) and diastolic (congestive) heart failure: Secondary | ICD-10-CM | POA: Insufficient documentation

## 2018-06-25 DIAGNOSIS — R258 Other abnormal involuntary movements: Secondary | ICD-10-CM | POA: Insufficient documentation

## 2018-06-25 DIAGNOSIS — R253 Fasciculation: Secondary | ICD-10-CM

## 2018-06-25 DIAGNOSIS — R251 Tremor, unspecified: Secondary | ICD-10-CM | POA: Diagnosis not present

## 2018-06-25 DIAGNOSIS — Z87891 Personal history of nicotine dependence: Secondary | ICD-10-CM | POA: Insufficient documentation

## 2018-06-25 DIAGNOSIS — J90 Pleural effusion, not elsewhere classified: Secondary | ICD-10-CM | POA: Diagnosis not present

## 2018-06-25 DIAGNOSIS — J449 Chronic obstructive pulmonary disease, unspecified: Secondary | ICD-10-CM | POA: Diagnosis not present

## 2018-06-25 DIAGNOSIS — Z79899 Other long term (current) drug therapy: Secondary | ICD-10-CM | POA: Insufficient documentation

## 2018-06-25 LAB — COMPREHENSIVE METABOLIC PANEL
ALBUMIN: 3.2 g/dL — AB (ref 3.5–5.0)
ALT: 12 U/L (ref 0–44)
AST: 19 U/L (ref 15–41)
Alkaline Phosphatase: 75 U/L (ref 38–126)
Anion gap: 9 (ref 5–15)
BUN: 34 mg/dL — ABNORMAL HIGH (ref 8–23)
CHLORIDE: 104 mmol/L (ref 98–111)
CO2: 26 mmol/L (ref 22–32)
Calcium: 8.4 mg/dL — ABNORMAL LOW (ref 8.9–10.3)
Creatinine, Ser: 1.2 mg/dL (ref 0.61–1.24)
GFR calc Af Amer: 59 mL/min — ABNORMAL LOW (ref >60)
GFR calc non Af Amer: 51 mL/min — ABNORMAL LOW (ref >60)
GLUCOSE: 97 mg/dL (ref 70–99)
POTASSIUM: 5.2 mmol/L — AB (ref 3.5–5.1)
Sodium: 139 mmol/L (ref 135–145)
Total Bilirubin: 1 mg/dL (ref 0.3–1.2)
Total Protein: 6.5 g/dL (ref 6.5–8.1)

## 2018-06-25 LAB — CBC WITH DIFFERENTIAL/PLATELET
Abs Immature Granulocytes: 0 10*3/uL (ref 0.0–0.1)
Basophils Absolute: 0 10*3/uL (ref 0.0–0.1)
Basophils Relative: 0 %
Eosinophils Absolute: 0.2 10*3/uL (ref 0.0–0.7)
Eosinophils Relative: 4 %
HCT: 32.6 % — ABNORMAL LOW (ref 39.0–52.0)
Hemoglobin: 10.5 g/dL — ABNORMAL LOW (ref 13.0–17.0)
IMMATURE GRANULOCYTES: 0 %
LYMPHS ABS: 0.7 10*3/uL (ref 0.7–4.0)
LYMPHS PCT: 14 %
MCH: 31.9 pg (ref 26.0–34.0)
MCHC: 32.2 g/dL (ref 30.0–36.0)
MCV: 99.1 fL (ref 78.0–100.0)
Monocytes Absolute: 0.9 10*3/uL (ref 0.1–1.0)
Monocytes Relative: 20 %
NEUTROS ABS: 2.9 10*3/uL (ref 1.7–7.7)
NEUTROS PCT: 62 %
Platelets: 146 10*3/uL — ABNORMAL LOW (ref 150–400)
RBC: 3.29 MIL/uL — AB (ref 4.22–5.81)
RDW: 16.8 % — AB (ref 11.5–15.5)
WBC: 4.7 10*3/uL (ref 4.0–10.5)

## 2018-06-25 MED ORDER — SODIUM CHLORIDE 0.9 % IV BOLUS
500.0000 mL | Freq: Once | INTRAVENOUS | Status: AC
  Administered 2018-06-25: 500 mL via INTRAVENOUS

## 2018-06-25 NOTE — ED Triage Notes (Signed)
Pt BIB [facility] from [place]. Pt is being seen for involuntary tremors in all 4 extremities. Pt is A+Ox4 during these episodes. No history of seizures. Pt states he has had this happen once before but did not get evaluated.

## 2018-06-26 LAB — POTASSIUM: Potassium: 4.4 mmol/L (ref 3.5–5.1)

## 2018-06-26 NOTE — ED Notes (Signed)
RN attempted to give Report to RN at [facility]. Staff did not ans Ph x3. RN informed PT[facility], to have staff contact RN for report

## 2018-06-26 NOTE — ED Notes (Signed)
PT[facility] contacted 

## 2018-06-26 NOTE — ED Provider Notes (Signed)
[facility] EMERGENCY DEPARTMENT Provider Note   CSN: [id number] Arrival date & time: 06/25/18  1943     History   Chief Complaint Chief Complaint  Patient presents with  . Tremors    HPI John Peck is a 84 y.o. male.  HPI  84 yo male ho coronary disease, CKD, hyperlipidemia, hypertension, paroxysmal atrial fibrillation, and stroke who presents to ED today from [facility] assisted living care facility with reports that he is having jerking motions.  He is having intermittent clonic movements.  He is awake.  He notes that these are occurring.  He denies having any history of seizures.  Past Medical History:  Diagnosis Date  . Anemia   . Arthritis   . CAD (coronary artery disease)    a. CABG '89; b. PCI '04; c. 11/2015 Cath/PCI: LM 20ost, LAD 142m, LCX 30p, OM2 40, RCA 30p, 10p ISR, 90d (3.0x12 Synergy DES), AM 90, VG->OM2 known to be 100, LIMA->LAD not injected, patent in 2015.  . Carotid stenosis, right   . Chronic combined systolic and diastolic CHF (congestive heart failure) ([place])   . CKD (chronic kidney disease), stage III ([place])   . H/O: GI bleed    REMOTE HISTORY  . History of COPD   . Hyperlipidemia   . Hypertensive heart disease   . LBBB (left bundle branch block)   . Pacemaker Sept 2009   St Jude  . PAF (paroxysmal atrial fibrillation) (HCC)    a. not on anticoag due to prior GIB.  . Sick sinus syndrome [facility]) Sept 2009   ST Jude PTVDP  . Stroke [facility])     Patient Active Problem List   Diagnosis Date Noted  . Pressure injury of skin 05/25/2018  . Pleural effusion 05/24/2018  . Acute respiratory failure with hypoxia ([place]) 05/24/2018  . Acute on chronic combined systolic and diastolic CHF (congestive heart failure) ([place]) 03/14/2018  . COPD clinical dx/ no pfts on record 03/14/2018  . H/O: GI bleed 03/14/2018  . CKD (chronic kidney disease), stage III ([place]) 03/14/2018  . Paroxysmal atrial fibrillation ([place]) 03/14/2018  . Hypothyroidism  03/14/2018  . Carotid stenosis, right 03/14/2018  . Facial droop 02/22/2018  . Pancytopenia ([place]) 02/22/2018  . Generalized weakness   . Hypertensive heart disease   . Hyperlipidemia   . Stented coronary artery   . Coronary artery disease involving native coronary artery of native heart with unstable angina pectoris ([place])   . Reactive airway disease 11/27/2015  . CAD in native artery 09/03/2015  . Weakness 05/09/2015  . Hyperlipidemia LDL goal <70 02/28/2015  . Atherosclerosis of lower extremity with claudication ([place]) 07/17/2014  . PAF (paroxysmal atrial fibrillation) ([place]) 03/24/2013  . Lower extremity edema 03/24/2013  . Sinoatrial node dysfunction ([place]) 08/17/2012  . History of iron deficiency anemia 09/18/2011  . Essential hypertension 07/07/2009  . INTERMEDIATE CORONARY SYNDROME 07/07/2009  . S/P CABG x 2 '89. RCA stent'04. last cath Jan 2013 07/07/2009  . PACEMAKER, PERMANENT- St Jude Sept 2009 07/07/2009  . Sick sinus syndrome ([place]) 07/10/2008  . Pacemaker 07/10/2008    Past Surgical History:  Procedure Laterality Date  . CARDIAC CATHETERIZATION  11/2011   EF 50%; significant native CAD w/80% stenosis of the LAD after 2nd giagonal vessel and septal perforating artery w/total occlusion of the mid left anterior descendiung.; 60-70% ostiasl stenosis in the circumflex vessel followed by 40% proximal stenosis and 70-80% distal circumflex stenosis;   . CARDIAC CATHETERIZATION N/A 11/29/2015  Procedure: Left Heart Cath and Cors/Grafts Angiography;  Surgeon: Burnell Blanks, MD;  Location: [facility] CV LAB;  Service: Cardiovascular;  Laterality: N/A;  . CARDIAC CATHETERIZATION N/A 11/29/2015   Procedure: Coronary Stent Intervention;  Surgeon: Burnell Blanks, MD;  Location: [place] CV LAB;  Service: Cardiovascular;  Laterality: N/A;  . CATARACT EXTRACTION  2004  . CORONARY ANGIOGRAPHY N/A 02/25/2017   Procedure: Coronary Angiography;  Surgeon: Belva Crome, MD;   Location: [place] CV LAB;  Service: Cardiovascular;  Laterality: N/A;  . CORONARY ANGIOPLASTY WITH STENT PLACEMENT  2004   RCA  . CORONARY ARTERY BYPASS GRAFT  1989   had LIMA to his LAD, a vein to the circumflex. In 2004 underwent stenting to his right coronary artery.  . CORONARY STENT INTERVENTION N/A 02/24/2017   Procedure: Coronary Stent Intervention;  Surgeon: Wellington Hampshire, MD;  Location: [place] CV LAB;  Service: Cardiovascular;  Laterality: N/A;  cfx  . HEMORRHOID SURGERY    . INSERT / REPLACE / REMOVE PACEMAKER  07/17/08   DUAL-CHAMBER; PPM-ST.JUDE MEDNET  . IR THORACENTESIS ASP PLEURAL SPACE W/IMG GUIDE  05/25/2018  . LEFT HEART CATH AND CORS/GRAFTS ANGIOGRAPHY N/A 02/24/2017   Procedure: Left Heart Cath and Cors/Grafts Angiography;  Surgeon: Wellington Hampshire, MD;  Location: [place] CV LAB;  Service: Cardiovascular;  Laterality: N/A;  . LEFT HEART CATHETERIZATION WITH CORONARY/GRAFT ANGIOGRAM N/A 12/10/2011   Procedure: LEFT HEART CATHETERIZATION WITH Beatrix Fetters;  Surgeon: Troy Sine, MD;  Location: [facility] CATH LAB;  Service: Cardiovascular;  Laterality: N/A;  . LEFT HEART CATHETERIZATION WITH CORONARY/GRAFT ANGIOGRAM N/A 01/26/2014   Procedure: LEFT HEART CATHETERIZATION WITH Beatrix Fetters;  Surgeon: Troy Sine, MD;  Location: [facility] CATH LAB;  Service: Cardiovascular;  Laterality: N/A;  . PPM GENERATOR CHANGEOUT N/A 12/15/2017   Procedure: PPM GENERATOR CHANGEOUT;  Surgeon: Evans Lance, MD;  Location: [place] CV LAB;  Service: Cardiovascular;  Laterality: N/A;  . PROSTATECTOMY          Home Medications    Prior to Admission medications   Medication Sig Start Date End Date Taking? Authorizing Provider  albuterol (PROVENTIL HFA;VENTOLIN HFA) 108 (90 Base) MCG/ACT inhaler Inhale 2 puffs into the lungs every 4 (four) hours as needed for wheezing or shortness of breath (or coughing). 05/27/18   Roxan Hockey, MD  ALPRAZolam Duanne Moron) 0.25  MG tablet Take 1 tablet (0.25 mg total) by mouth 2 (two) times daily as needed for anxiety. 05/27/18   Roxan Hockey, MD  aspirin 81 MG tablet Take 81 mg by mouth daily.     [provider]  atorvastatin (LIPITOR) 40 MG tablet Take 1 tablet (40 mg total) by mouth daily. 09/18/15   Troy Sine, MD  azithromycin (ZITHROMAX) 500 MG tablet Take 1 tablet (500 mg total) by mouth daily. Patient not taking: Reported on 06/02/2018 05/27/18   Roxan Hockey, MD  carvedilol (COREG) 12.5 MG tablet Take 1 tablet (12.5 mg total) by mouth 2 (two) times daily with a meal. 05/27/18   Emokpae, Courage, MD  cephALEXin (KEFLEX) 500 MG capsule Take 1 capsule (500 mg total) by mouth 2 (two) times daily. 06/02/18   Virgel Manifold, MD  cephALEXin (KEFLEX) 500 MG capsule Take 1 capsule (500 mg total) by mouth 2 (two) times daily. 06/02/18   Virgel Manifold, MD  clopidogrel (PLAVIX) 75 MG tablet Take 1 tablet (75 mg total) by mouth daily. 05/27/18   Roxan Hockey, MD  ezetimibe (ZETIA)  10 MG tablet Take 10 mg by mouth daily.    [provider]  ferrous sulfate 325 (65 FE) MG tablet Take 1 tablet (325 mg total) by mouth 2 (two) times daily with a meal. 05/27/18   Emokpae, Courage, MD  furosemide (LASIX) 20 MG tablet Take 2 tablets (40 mg total) by mouth 2 (two) times daily. 05/27/18 05/27/19  Roxan Hockey, MD  ipratropium-albuterol (DUONEB) 0.5-2.5 (3) MG/3ML SOLN Take 3 mLs by nebulization 2 (two) times daily. 02/24/18   Mikhail, Velta Addison, DO  isosorbide mononitrate (IMDUR) 30 MG 24 hr tablet Take 3 tablets (90 mg total) by mouth daily. 05/28/18   Roxan Hockey, MD  isosorbide mononitrate (IMDUR) 30 MG 24 hr tablet Take 1 tablet (30 mg total) by mouth at bedtime. Patient not taking: Reported on 06/02/2018 05/27/18   Roxan Hockey, MD  latanoprost (XALATAN) 0.005 % ophthalmic solution Place 1 drop into both eyes 2 (two) times a week.  11/07/15   [provider]  levothyroxine (SYNTHROID,  LEVOTHROID) 125 MCG tablet TAKE 1 TABLET BY MOUTH EVERY DAY BEFORE BREAKFAST 04/05/18   Troy Sine, MD  pantoprazole (PROTONIX) 40 MG tablet Take 1 tablet (40 mg total) by mouth daily. 05/28/18   Roxan Hockey, MD  polyethylene glycol (MIRALAX / GLYCOLAX) packet Take 17 g by mouth daily. 05/27/18   Roxan Hockey, MD  ranolazine (RANEXA) 1000 MG SR tablet Take 1 tablet (1,000 mg total) by mouth 2 (two) times daily. 05/27/18   Roxan Hockey, MD  senna (SENOKOT) 8.6 MG tablet Take 2 tablets by mouth 2 (two) times daily as needed for constipation.    [provider]  tamsulosin (FLOMAX) 0.4 MG CAPS capsule Take 1 capsule (0.4 mg total) by mouth daily. 05/27/18   Roxan Hockey, MD    Family History Family History  Problem Relation Age of Onset  . Diabetes Father   . Stroke Sister   . Heart disease Sister   . Heart disease Brother   . Heart attack Brother   . Stroke Brother   . Hypertension Neg Hx     Social History Social History   Tobacco Use  . Smoking status: Former Smoker    Packs/day: 0.75    Years: 65.00    Pack years: 48.75    Types: Cigarettes    Last attempt to quit: 11/09/1997    Years since quitting: 20.6  . Smokeless tobacco: Former Systems developer    Quit date: 10/29/2008  Substance Use Topics  . Alcohol use: No  . Drug use: No     Allergies   Patient has no known allergies.   Review of Systems Review of Systems  All other systems reviewed and are negative.    Physical Exam Updated Vital Signs BP 110/62   Pulse 62   Temp 99 F (37.2 C) (Rectal)   Resp (!) 29   Ht 1.753 m (5\' 9" )   Wt 71.2 kg   SpO2 97%   BMI 23.18 kg/m   Physical Exam  Constitutional: He is oriented to person, place, and time. He appears well-developed and well-nourished.  HENT:  Head: Normocephalic and atraumatic.  Right Ear: External ear normal.  Left Ear: External ear normal.  Mouth/Throat: Oropharynx is clear and moist.  Eyes: Pupils are equal, round, and  reactive to light.  Neck: Normal range of motion.  Cardiovascular: Normal rate, regular rhythm and normal heart sounds.  Pulmonary/Chest: Effort normal and breath sounds normal.  Abdominal: Soft. Bowel sounds are normal.  Musculoskeletal: Normal range of motion.  Neurological: He is alert and oriented to person, place, and time.  Skin: Skin is warm.  Nursing note and vitals reviewed.    ED Treatments / Results  Labs (all labs ordered are listed, but only abnormal results are displayed) Labs Reviewed  CBC WITH DIFFERENTIAL/PLATELET - Abnormal; Notable for the following components:      Result Value   RBC 3.29 (*)    Hemoglobin 10.5 (*)    HCT 32.6 (*)    RDW 16.8 (*)    Platelets 146 (*)    All other components within normal limits  COMPREHENSIVE METABOLIC PANEL - Abnormal; Notable for the following components:   Potassium 5.2 (*)    BUN 34 (*)    Calcium 8.4 (*)    Albumin 3.2 (*)    GFR calc non Af Amer 51 (*)    GFR calc Af Amer 59 (*)    All other components within normal limits  POTASSIUM    EKG None  Radiology Ct Head Wo Contrast  Result Date: 06/25/2018 CLINICAL DATA:  Patient with involuntary tremors EXAM: CT HEAD WITHOUT CONTRAST TECHNIQUE: Contiguous axial images were obtained from the base of the skull through the vertex without intravenous contrast. COMPARISON:  Brain CT 02/23/2018 FINDINGS: Brain: Exam limited secondary to motion artifact. Ventricles and sulci are prominent compatible with atrophy. Periventricular and subcortical white matter hypodensity compatible with chronic microvascular ischemic changes. No evidence for acute cortically based infarct, intracranial hemorrhage, mass lesion or mass-effect. Old left basal ganglia lacunar infarct. Vascular: No hyperdense vessel or unexpected calcification. Skull: Intact. Sinuses/Orbits: Paranasal sinuses are well aerated. Mastoid air cells are unremarkable. Other: Re demonstrated 2.4 cm mass anterior to the left  parotid gland (image 2; series 4). On prior exam this was favored to represent dermal appendage cyst. IMPRESSION: 1. No acute intracranial process. Atrophy and chronic microvascular ischemic changes. Electronically Signed   By: Lovey Newcomer M.D.   On: 06/25/2018 21:58   Dg Chest Port 1 View  Result Date: 06/25/2018 CLINICAL DATA:  Patient with tremors EXAM: PORTABLE CHEST 1 VIEW COMPARISON:  Chest radiograph 06/02/2018 FINDINGS: Multi lead pacer apparatus overlies the left hemithorax, leads are stable in position. Monitoring leads overlie the patient. Stable cardiomegaly status post median sternotomy. Small right pleural effusion with heterogeneous opacities right lung base. Bilateral interstitial opacities, right greater than left. No pneumothorax. Thoracic spine degenerative changes. IMPRESSION: Small right pleural effusion with underlying opacities which may represent atelectasis or infection. Asymmetric right-greater-than-left interstitial opacities may represent edema. Electronically Signed   By: Lovey Newcomer M.D.   On: 06/25/2018 21:31    Procedures Procedures (including critical care time)  Medications Ordered in ED Medications  sodium chloride 0.9 % bolus 500 mL (500 mLs Intravenous New Bag/Given 06/25/18 2255)     Initial Impression / Assessment and Plan / ED Course  I have reviewed the triage vital signs and the nursing notes.  Pertinent labs & imaging results that were available during my care of the patient were reviewed by me and considered in my medical decision making (see chart for details).    Reexamined with Dr. Tyrone Nine.  Tonic-clonic jerks have resolved.  He does not appear to have any lateralized deficits.  He had a mildly elevated potassium 5.2.  He is received some IV fluid and this will be rechecked.  Her labs appear normal the exception of mild anemia which appears stable. Chest x-Keir Foland is noted to have some small right  pleural effusion.  He also notes some interstitial  opacities that may represent edema.  However, this does not correlate clinically with the patient presentation, complaint, or exam. Patient had tonic jerking which does not appear to be a generalized seizure.  Medications are reviewed and I see no medications until obviously cause a symptoms.  If he remains stable with resolution of his symptoms, he may be discharged to follow-up as outpatient. Final Clinical Impressions(s) / ED Diagnoses   Final diagnoses:  [place]    ED Discharge Orders    None       Pattricia Boss, MD 06/26/18 [phone]

## 2018-06-27 ENCOUNTER — Encounter: Payer: Self-pay | Admitting: *Deleted

## 2018-06-27 ENCOUNTER — Other Ambulatory Visit: Payer: Self-pay | Admitting: *Deleted

## 2018-06-27 DIAGNOSIS — R253 Fasciculation: Secondary | ICD-10-CM | POA: Diagnosis not present

## 2018-06-27 DIAGNOSIS — R300 Dysuria: Secondary | ICD-10-CM | POA: Diagnosis not present

## 2018-06-27 DIAGNOSIS — I10 Essential (primary) hypertension: Secondary | ICD-10-CM | POA: Diagnosis not present

## 2018-06-27 DIAGNOSIS — R410 Disorientation, unspecified: Secondary | ICD-10-CM | POA: Diagnosis not present

## 2018-06-27 NOTE — Patient Outreach (Signed)
[facility] ([facility]) Care Management  06/27/2018  John Peck 05/01/1924 [id number]   CSW was able to meet with patient at [facility] today, Skilled Nursing Facility where patient currently resides to receive short-term rehabilitative services.  According to patient, he presented to the Emergency Department at [facility] on Sunday, June 26, 2018, due to Tremors.  Patient was treated and released that same day.  Patient reports feeling, "somewhat out of sorts today". According to Morgan, Social Worker at [facility], she is currently working with patient and patient's family on long-term care placement arrangements for patient at [facility].  Morgan went on to say that a Long-Term Care Medicaid application has already been initiated for patient and the application is currently pending with the [place] Department of Social Services.   CSW will perform a case closure on patient, as all goals of treatment have been met from social work standpoint and no additional social work needs have been identified at this time.  CSW will fax an update to patient's Primary Care Physician, Dr. Ronald Polite to ensure that they are aware of CSW's involvement with patient's plan of care.    , BSW, MSW, LCSW  Licensed Clinical Social Worker  [facility] Care Management [place]  Mailing Address-[address], [place], [place] [address] Physical Address-[address], [place], [place] [address] Toll Free Main # [phone] Fax # [phone] Cell # [phone]  Office # [phone] [email or web address]         

## 2018-06-28 DIAGNOSIS — R300 Dysuria: Secondary | ICD-10-CM | POA: Diagnosis not present

## 2018-06-28 DIAGNOSIS — R410 Disorientation, unspecified: Secondary | ICD-10-CM | POA: Diagnosis not present

## 2018-06-29 ENCOUNTER — Other Ambulatory Visit: Payer: Self-pay

## 2018-06-29 ENCOUNTER — Emergency Department (HOSPITAL_COMMUNITY): Payer: Medicare HMO

## 2018-06-29 ENCOUNTER — Observation Stay (HOSPITAL_COMMUNITY)
Admission: EM | Admit: 2018-06-29 | Discharge: 2018-07-06 | Disposition: A | Discharge status: Home / Self Care | Payer: Medicare HMO | Attending: Internal Medicine | Admitting: Internal Medicine

## 2018-06-29 ENCOUNTER — Encounter (HOSPITAL_COMMUNITY): Payer: Self-pay

## 2018-06-29 ENCOUNTER — Observation Stay (HOSPITAL_COMMUNITY): Payer: Medicare HMO

## 2018-06-29 DIAGNOSIS — E860 Dehydration: Secondary | ICD-10-CM

## 2018-06-29 DIAGNOSIS — I5042 Chronic combined systolic (congestive) and diastolic (congestive) heart failure: Secondary | ICD-10-CM | POA: Insufficient documentation

## 2018-06-29 DIAGNOSIS — R319 Hematuria, unspecified: Secondary | ICD-10-CM

## 2018-06-29 DIAGNOSIS — I639 Cerebral infarction, unspecified: Secondary | ICD-10-CM | POA: Diagnosis not present

## 2018-06-29 DIAGNOSIS — N183 Chronic kidney disease, stage 3 unspecified: Secondary | ICD-10-CM | POA: Diagnosis present

## 2018-06-29 DIAGNOSIS — B964 Proteus (mirabilis) (morganii) as the cause of diseases classified elsewhere: Secondary | ICD-10-CM | POA: Diagnosis not present

## 2018-06-29 DIAGNOSIS — I480 Paroxysmal atrial fibrillation: Principal | ICD-10-CM | POA: Diagnosis present

## 2018-06-29 DIAGNOSIS — E785 Hyperlipidemia, unspecified: Secondary | ICD-10-CM | POA: Diagnosis present

## 2018-06-29 DIAGNOSIS — G9341 Metabolic encephalopathy: Secondary | ICD-10-CM | POA: Insufficient documentation

## 2018-06-29 DIAGNOSIS — J449 Chronic obstructive pulmonary disease, unspecified: Secondary | ICD-10-CM | POA: Diagnosis present

## 2018-06-29 DIAGNOSIS — G459 Transient cerebral ischemic attack, unspecified: Secondary | ICD-10-CM | POA: Diagnosis not present

## 2018-06-29 DIAGNOSIS — R4182 Altered mental status, unspecified: Secondary | ICD-10-CM | POA: Diagnosis not present

## 2018-06-29 DIAGNOSIS — Z7982 Long term (current) use of aspirin: Secondary | ICD-10-CM | POA: Insufficient documentation

## 2018-06-29 DIAGNOSIS — E039 Hypothyroidism, unspecified: Secondary | ICD-10-CM | POA: Insufficient documentation

## 2018-06-29 DIAGNOSIS — Z950 Presence of cardiac pacemaker: Secondary | ICD-10-CM | POA: Diagnosis not present

## 2018-06-29 DIAGNOSIS — Z7902 Long term (current) use of antithrombotics/antiplatelets: Secondary | ICD-10-CM | POA: Insufficient documentation

## 2018-06-29 DIAGNOSIS — R5383 Other fatigue: Secondary | ICD-10-CM | POA: Diagnosis not present

## 2018-06-29 DIAGNOSIS — N390 Urinary tract infection, site not specified: Secondary | ICD-10-CM | POA: Insufficient documentation

## 2018-06-29 DIAGNOSIS — Z8673 Personal history of transient ischemic attack (TIA), and cerebral infarction without residual deficits: Secondary | ICD-10-CM | POA: Insufficient documentation

## 2018-06-29 DIAGNOSIS — R29810 Facial weakness: Secondary | ICD-10-CM | POA: Diagnosis present

## 2018-06-29 DIAGNOSIS — I509 Heart failure, unspecified: Secondary | ICD-10-CM

## 2018-06-29 DIAGNOSIS — I130 Hypertensive heart and chronic kidney disease with heart failure and stage 1 through stage 4 chronic kidney disease, or unspecified chronic kidney disease: Secondary | ICD-10-CM | POA: Insufficient documentation

## 2018-06-29 DIAGNOSIS — N179 Acute kidney failure, unspecified: Secondary | ICD-10-CM | POA: Diagnosis not present

## 2018-06-29 DIAGNOSIS — I2510 Atherosclerotic heart disease of native coronary artery without angina pectoris: Secondary | ICD-10-CM | POA: Diagnosis present

## 2018-06-29 DIAGNOSIS — Z87891 Personal history of nicotine dependence: Secondary | ICD-10-CM | POA: Insufficient documentation

## 2018-06-29 DIAGNOSIS — Z951 Presence of aortocoronary bypass graft: Secondary | ICD-10-CM | POA: Insufficient documentation

## 2018-06-29 DIAGNOSIS — J90 Pleural effusion, not elsewhere classified: Secondary | ICD-10-CM | POA: Diagnosis not present

## 2018-06-29 LAB — COMPREHENSIVE METABOLIC PANEL
ALT: 11 U/L (ref 0–44)
AST: 31 U/L (ref 15–41)
Albumin: 3.8 g/dL (ref 3.5–5.0)
Alkaline Phosphatase: 64 U/L (ref 38–126)
Anion gap: 14 (ref 5–15)
BUN: 38 mg/dL — ABNORMAL HIGH (ref 8–23)
CO2: 26 mmol/L (ref 22–32)
Calcium: 9.1 mg/dL (ref 8.9–10.3)
Chloride: 101 mmol/L (ref 98–111)
Creatinine, Ser: 2.2 mg/dL — ABNORMAL HIGH (ref 0.61–1.24)
GFR calc Af Amer: 28 mL/min — ABNORMAL LOW (ref >60)
GFR calc non Af Amer: 24 mL/min — ABNORMAL LOW (ref >60)
Glucose, Bld: 101 mg/dL — ABNORMAL HIGH (ref 70–99)
Potassium: 4.3 mmol/L (ref 3.5–5.1)
Sodium: 141 mmol/L (ref 135–145)
Total Bilirubin: 1.1 mg/dL (ref 0.3–1.2)
Total Protein: 7.6 g/dL (ref 6.5–8.1)

## 2018-06-29 LAB — DIFFERENTIAL
Basophils Absolute: 0 10*3/uL (ref 0.0–0.1)
Basophils Relative: 0 %
Eosinophils Absolute: 0.3 10*3/uL (ref 0.0–0.7)
Eosinophils Relative: 6 %
Lymphocytes Relative: 18 %
Lymphs Abs: 0.8 10*3/uL (ref 0.7–4.0)
Monocytes Absolute: 0.8 10*3/uL (ref 0.1–1.0)
Monocytes Relative: 16 %
Neutro Abs: 2.9 10*3/uL (ref 1.7–7.7)
Neutrophils Relative %: 60 %

## 2018-06-29 LAB — CBC
HCT: 35.3 % — ABNORMAL LOW (ref 39.0–52.0)
Hemoglobin: 11.6 g/dL — ABNORMAL LOW (ref 13.0–17.0)
MCH: 31.5 pg (ref 26.0–34.0)
MCHC: 32.9 g/dL (ref 30.0–36.0)
MCV: 95.9 fL (ref 78.0–100.0)
Platelets: 158 10*3/uL (ref 150–400)
RBC: 3.68 MIL/uL — ABNORMAL LOW (ref 4.22–5.81)
RDW: 16.5 % — ABNORMAL HIGH (ref 11.5–15.5)
WBC: 4.7 10*3/uL (ref 4.0–10.5)

## 2018-06-29 LAB — BLOOD GAS, ARTERIAL
Acid-base deficit: 1 mmol/L (ref 0.0–2.0)
Bicarbonate: 23.3 mmol/L (ref 20.0–28.0)
Drawn by: 331471
O2 Saturation: 92.9 %
Patient temperature: 98.6
pCO2 arterial: 39.5 mmHg (ref 32.0–48.0)
pH, Arterial: 7.388 (ref 7.350–7.450)
pO2, Arterial: 75.3 mmHg — ABNORMAL LOW (ref 83.0–108.0)

## 2018-06-29 LAB — PROTIME-INR
INR: 1.24
Prothrombin Time: 15.5 seconds — ABNORMAL HIGH (ref 11.4–15.2)

## 2018-06-29 LAB — URINALYSIS, ROUTINE W REFLEX MICROSCOPIC
Bilirubin Urine: NEGATIVE
Glucose, UA: NEGATIVE mg/dL
Hgb urine dipstick: NEGATIVE
Ketones, ur: NEGATIVE mg/dL
Leukocytes, UA: NEGATIVE
Nitrite: NEGATIVE
Protein, ur: NEGATIVE mg/dL
Specific Gravity, Urine: 1.013 (ref 1.005–1.030)
pH: 5 (ref 5.0–8.0)

## 2018-06-29 LAB — CBG MONITORING, ED
GLUCOSE-CAPILLARY: 86 mg/dL (ref 70–99)
Glucose-Capillary: 89 mg/dL (ref 70–99)
Glucose-Capillary: 74 mg/dL (ref 70–99)
Glucose-Capillary: 97 mg/dL (ref 70–99)

## 2018-06-29 LAB — I-STAT TROPONIN, ED: Troponin i, poc: 0.06 ng/mL (ref 0.00–0.08)

## 2018-06-29 LAB — APTT: aPTT: 21 seconds — ABNORMAL LOW (ref 24–36)

## 2018-06-29 MED ORDER — SODIUM CHLORIDE 0.9 % IV BOLUS
500.0000 mL | Freq: Once | INTRAVENOUS | Status: AC
  Administered 2018-06-29: 500 mL via INTRAVENOUS

## 2018-06-29 MED ORDER — ENOXAPARIN SODIUM 30 MG/0.3ML ~~LOC~~ SOLN
30.0000 mg | SUBCUTANEOUS | Status: DC
  Administered 2018-06-30 (×2): 30 mg via SUBCUTANEOUS
  Filled 2018-06-29 (×2): qty 0.3

## 2018-06-29 MED ORDER — SODIUM CHLORIDE 0.9 % IV SOLN
INTRAVENOUS | Status: DC
  Administered 2018-06-30: 01:00:00 via INTRAVENOUS

## 2018-06-29 MED ORDER — ACETAMINOPHEN 160 MG/5ML PO SOLN: 650.0000 mg | ORAL | Status: DC | PRN

## 2018-06-29 MED ORDER — LATANOPROST 0.005 % OP SOLN
1.0000 [drp] | OPHTHALMIC | Status: DC
  Administered 2018-06-30 – 2018-07-04 (×2): 1 [drp] via OPHTHALMIC
  Filled 2018-06-29: qty 2.5

## 2018-06-29 MED ORDER — DEXTROSE-NACL 5-0.9 % IV SOLN
INTRAVENOUS | Status: DC
  Administered 2018-06-29: 16:00:00 via INTRAVENOUS

## 2018-06-29 MED ORDER — EZETIMIBE 10 MG PO TABS
10.0000 mg | ORAL_TABLET | Freq: Every day | ORAL | Status: DC
  Administered 2018-07-01 – 2018-07-06 (×6): 10 mg via ORAL
  Filled 2018-06-29 (×6): qty 1

## 2018-06-29 MED ORDER — ALBUTEROL SULFATE HFA 108 (90 BASE) MCG/ACT IN AERS: 2.0000 | INHALATION_SPRAY | RESPIRATORY_TRACT | Status: DC | PRN

## 2018-06-29 MED ORDER — IPRATROPIUM-ALBUTEROL 0.5-2.5 (3) MG/3ML IN SOLN
3.0000 mL | Freq: Three times a day (TID) | RESPIRATORY_TRACT | Status: DC
  Filled 2018-06-29: qty 3

## 2018-06-29 MED ORDER — STROKE: EARLY STAGES OF RECOVERY BOOK
Freq: Once | Status: DC
  Administered 2018-06-30: 01:00:00

## 2018-06-29 MED ORDER — ACETAMINOPHEN 650 MG RE SUPP: 650.0000 mg | RECTAL | Status: DC | PRN

## 2018-06-29 MED ORDER — ALBUTEROL SULFATE (2.5 MG/3ML) 0.083% IN NEBU
2.5000 mg | INHALATION_SOLUTION | RESPIRATORY_TRACT | Status: DC | PRN
  Administered 2018-07-02 – 2018-07-05 (×4): 2.5 mg via RESPIRATORY_TRACT
  Filled 2018-06-29 (×4): qty 3

## 2018-06-29 MED ORDER — RANOLAZINE ER 500 MG PO TB12: 1000.0000 mg | ORAL_TABLET | Freq: Two times a day (BID) | ORAL | Status: DC

## 2018-06-29 MED ORDER — SENNOSIDES-DOCUSATE SODIUM 8.6-50 MG PO TABS: 1.0000 | ORAL_TABLET | Freq: Every evening | ORAL | Status: DC | PRN

## 2018-06-29 MED ORDER — ASPIRIN 81 MG PO CHEW
81.0000 mg | CHEWABLE_TABLET | Freq: Every day | ORAL | Status: DC
  Administered 2018-07-01 – 2018-07-06 (×6): 81 mg via ORAL
  Filled 2018-06-29 (×6): qty 1

## 2018-06-29 MED ORDER — ATORVASTATIN CALCIUM 80 MG PO TABS: 80.0000 mg | ORAL_TABLET | Freq: Every day | ORAL | Status: DC

## 2018-06-29 MED ORDER — ACETAMINOPHEN 325 MG PO TABS
650.0000 mg | ORAL_TABLET | ORAL | Status: DC | PRN
  Administered 2018-07-01 – 2018-07-06 (×13): 650 mg via ORAL
  Filled 2018-06-29 (×14): qty 2

## 2018-06-29 MED ORDER — LEVOTHYROXINE SODIUM 25 MCG PO TABS
125.0000 ug | ORAL_TABLET | Freq: Every day | ORAL | Status: DC
  Administered 2018-07-01 – 2018-07-06 (×6): 125 ug via ORAL
  Filled 2018-06-29 (×6): qty 1

## 2018-06-29 MED ORDER — PANTOPRAZOLE SODIUM 40 MG PO TBEC
40.0000 mg | DELAYED_RELEASE_TABLET | Freq: Every day | ORAL | Status: DC
  Administered 2018-07-01 – 2018-07-06 (×6): 40 mg via ORAL
  Filled 2018-06-29 (×6): qty 1

## 2018-06-29 MED ORDER — TAMSULOSIN HCL 0.4 MG PO CAPS
0.4000 mg | ORAL_CAPSULE | Freq: Every day | ORAL | Status: DC
  Administered 2018-07-01 – 2018-07-06 (×6): 0.4 mg via ORAL
  Filled 2018-06-29 (×6): qty 1

## 2018-06-29 MED ORDER — CLOPIDOGREL BISULFATE 75 MG PO TABS
75.0000 mg | ORAL_TABLET | Freq: Every day | ORAL | Status: DC
  Administered 2018-07-01 – 2018-07-06 (×6): 75 mg via ORAL
  Filled 2018-06-29 (×6): qty 1

## 2018-06-29 MED ORDER — SENNA 8.6 MG PO TABS: 2.0000 | ORAL_TABLET | Freq: Two times a day (BID) | ORAL | Status: DC | PRN

## 2018-06-29 NOTE — ED Notes (Signed)
Bed: LH73 Expected date:  Expected time:  Means of arrival:  Comments: EMS/89 male <L.O.C.

## 2018-06-29 NOTE — H&P (Signed)
[facility] History and Physical  John Peck BJY:[id number] DOB: 1923-12-02 DOA: 06/29/2018  PCP: Seward Carol, MD  Patient coming from: [place]  Chief Complaint: Altered mental status  HPI: John Peck is a 84 y.o. male with a medical history of heart failure, chronic kidney disease, paroxysmal atrial fibrillation not on anticoagulation, hypertension, hyperlipidemia, COPD, who presented to the emergency department with complaints of altered mental status.  Information provided by daughters via phone.  Patient was seen normal on Sunday.  Monday he was started having altered mental status with incoherent speech, profanity, and laughing inappropriately.   At the nursing facility, [facility] place, it was thought that he could have a urinary tract infection and he was started on ciprofloxacin on 06/28/2018.  Per daughter, UA was negative.  Daughter states this morning she saw her father and noted that he had a left sided facial droop which was new.  She also noted that he was "subdued".  They deny any other symptoms or complaints from him recently.    ED Course: CT had obtained which is unremarkable for acute process.  UA also unremarkable for infection.  TRH called for admission.  Review of Systems:  Review of systems cannot be obtained from patient given current state  Past Medical History:  Diagnosis Date  . Anemia   . Arthritis   . CAD (coronary artery disease)    a. CABG '89; b. PCI '04; c. 11/2015 Cath/PCI: LM 20ost, LAD 136m, LCX 30p, OM2 40, RCA 30p, 10p ISR, 90d (3.0x12 Synergy DES), AM 90, VG->OM2 known to be 100, LIMA->LAD not injected, patent in 2015.  . Carotid stenosis, right   . Chronic combined systolic and diastolic CHF (congestive heart failure) ([place])   . CKD (chronic kidney disease), stage III ([place])   . H/O: GI bleed    REMOTE HISTORY  . History of COPD   . Hyperlipidemia   . Hypertensive heart disease   . LBBB (left bundle branch block)   . Pacemaker  Sept 2009   St Jude  . PAF (paroxysmal atrial fibrillation) (HCC)    a. not on anticoag due to prior GIB.  . Sick sinus syndrome [facility]) Sept 2009   ST Jude PTVDP  . Stroke [facility])     Past Surgical History:  Procedure Laterality Date  . CARDIAC CATHETERIZATION  11/2011   EF 50%; significant native CAD w/80% stenosis of the LAD after 2nd giagonal vessel and septal perforating artery w/total occlusion of the mid left anterior descendiung.; 60-70% ostiasl stenosis in the circumflex vessel followed by 40% proximal stenosis and 70-80% distal circumflex stenosis;   . CARDIAC CATHETERIZATION N/A 11/29/2015   Procedure: Left Heart Cath and Cors/Grafts Angiography;  Surgeon: Burnell Blanks, MD;  Location: [place] [facility];  Service: Cardiovascular;  Laterality: N/A;  . CARDIAC CATHETERIZATION N/A 11/29/2015   Procedure: Coronary Stent Intervention;  Surgeon: Burnell Blanks, MD;  Location: [place] [facility];  Service: Cardiovascular;  Laterality: N/A;  . CATARACT EXTRACTION  2004  . CORONARY ANGIOGRAPHY N/A 02/25/2017   Procedure: Coronary Angiography;  Surgeon: Belva Crome, MD;  Location: [place] [facility];  Service: Cardiovascular;  Laterality: N/A;  . CORONARY ANGIOPLASTY WITH STENT PLACEMENT  2004   RCA  . CORONARY ARTERY BYPASS GRAFT  1989   had LIMA to his LAD, a vein to the circumflex. In 2004 underwent stenting to his right coronary artery.  . CORONARY STENT INTERVENTION N/A 02/24/2017   Procedure: Coronary  Stent Intervention;  Surgeon: Wellington Hampshire, MD;  Location: [place] [facility] LAB;  Service: Cardiovascular;  Laterality: N/A;  cfx  . HEMORRHOID SURGERY    . INSERT / REPLACE / REMOVE PACEMAKER  07/17/08   DUAL-CHAMBER; PPM-ST.JUDE MEDNET  . IR THORACENTESIS ASP PLEURAL SPACE W/IMG GUIDE  05/25/2018  . LEFT HEART CATH AND CORS/GRAFTS ANGIOGRAPHY N/A 02/24/2017   Procedure: Left Heart Cath and Cors/Grafts Angiography;  Surgeon: Wellington Hampshire, MD;  Location: [place] [facility]  [facility];  Service: Cardiovascular;  Laterality: N/A;  . LEFT HEART CATHETERIZATION WITH CORONARY/GRAFT ANGIOGRAM N/A 12/10/2011   Procedure: LEFT HEART CATHETERIZATION WITH Beatrix Fetters;  Surgeon: Troy Sine, MD;  Location[facility] CATH LAB;  Service: Cardiovascular;  Laterality: N/A;  . LEFT HEART CATHETERIZATION WITH CORONARY/GRAFT ANGIOGRAM N/A 01/26/2014   Procedure: LEFT HEART CATHETERIZATION WITH Beatrix Fetters;  Surgeon: Troy Sine, MD;  Location: [facility] CATH LAB;  Service: Cardiovascular;  Laterality: N/A;  . PPM GENERATOR CHANGEOUT N/A 12/15/2017   Procedure: PPM GENERATOR CHANGEOUT;  Surgeon: Evans Lance, MD;  Location: [place] CV LAB;  Service: Cardiovascular;  Laterality: N/A;  . PROSTATECTOMY      Social History:  reports that he quit smoking about 20 years ago. His smoking use included cigarettes. He has a 48.75 pack-year smoking history. He quit smokeless tobacco use about 9 years ago. He reports that he does not drink alcohol or use drugs.  No Known Allergies  Family History  Problem Relation Age of Onset  . Diabetes Father   . Stroke Sister   . Heart disease Sister   . Heart disease Brother   . Heart attack Brother   . Stroke Brother   . Hypertension Neg Hx      Prior to Admission medications   Medication Sig Start Date End Date Taking? Authorizing Provider  acetaminophen (TYLENOL) 325 MG tablet Take 650 mg by mouth 3 (three) times daily.   Yes [provider]  albuterol (PROVENTIL HFA;VENTOLIN HFA) 108 (90 Base) MCG/ACT inhaler Inhale 2 puffs into the lungs every 4 (four) hours as needed for wheezing or shortness of breath (or coughing). 05/27/18  Yes Emokpae, Courage, MD  ALPRAZolam (XANAX) 0.25 MG tablet Take 1 tablet (0.25 mg total) by mouth 2 (two) times daily as needed for anxiety. 05/27/18  Yes Emokpae, Courage, MD  aspirin 81 MG tablet Take 81 mg by mouth daily.    Yes [provider]  atorvastatin (LIPITOR) 40 MG tablet  Take 1 tablet (40 mg total) by mouth daily. Patient taking differently: Take 40 mg by mouth every evening.  09/18/15  Yes Troy Sine, MD  carvedilol (COREG) 12.5 MG tablet Take 1 tablet (12.5 mg total) by mouth 2 (two) times daily with a meal. 05/27/18  Yes Emokpae, Courage, MD  ciprofloxacin (CIPRO) 250 MG tablet Take 250 mg by mouth 2 (two) times daily. 06/28/18 07/01/18 Yes [provider]  clopidogrel (PLAVIX) 75 MG tablet Take 1 tablet (75 mg total) by mouth daily. 05/27/18  Yes Emokpae, Courage, MD  ezetimibe (ZETIA) 10 MG tablet Take 10 mg by mouth daily.   Yes [provider]  ferrous sulfate 325 (65 FE) MG tablet Take 1 tablet (325 mg total) by mouth 2 (two) times daily with a meal. 05/27/18  Yes Emokpae, Courage, MD  furosemide (LASIX) 20 MG tablet Take 2 tablets (40 mg total) by mouth 2 (two) times daily. Patient taking differently: Take 60 mg by mouth 2 (two) times  daily.  05/27/18 05/27/19 Yes Emokpae, Courage, MD  ipratropium-albuterol (DUONEB) 0.5-2.5 (3) MG/3ML SOLN Take 3 mLs by nebulization 2 (two) times daily. Patient taking differently: Take 3 mLs by nebulization 3 (three) times daily.  02/24/18  Yes Zahniya Zellars, Velta Addison, DO  isosorbide mononitrate (IMDUR) 30 MG 24 hr tablet Take 3 tablets (90 mg total) by mouth daily. 05/28/18  Yes Emokpae, Courage, MD  latanoprost (XALATAN) 0.005 % ophthalmic solution Place 1 drop into both eyes 2 (two) times a week. On mondays and wednesdays 11/07/15  Yes [provider]  levothyroxine (SYNTHROID, LEVOTHROID) 125 MCG tablet TAKE 1 TABLET BY MOUTH EVERY DAY BEFORE BREAKFAST Patient taking differently: Take 125 mcg by mouth daily before breakfast.  04/05/18  Yes Troy Sine, MD  mirtazapine (REMERON) 7.5 MG tablet Take 7.5 mg by mouth at bedtime.   Yes [provider]  pantoprazole (PROTONIX) 40 MG tablet Take 1 tablet (40 mg total) by mouth daily. 05/28/18  Yes Emokpae, Courage, MD  polyethylene glycol (MIRALAX /  GLYCOLAX) packet Take 17 g by mouth daily. 05/27/18  Yes Emokpae, Courage, MD  ranolazine (RANEXA) 1000 MG SR tablet Take 1 tablet (1,000 mg total) by mouth 2 (two) times daily. 05/27/18  Yes Roxan Hockey, MD  senna (SENOKOT) 8.6 MG tablet Take 2 tablets by mouth 2 (two) times daily as needed for constipation.   Yes [provider]  tamsulosin (FLOMAX) 0.4 MG CAPS capsule Take 1 capsule (0.4 mg total) by mouth daily. 05/27/18  Yes Roxan Hockey, MD    Physical Exam: Vitals:   06/29/18 1700 06/29/18 1715  BP: 137/70 137/63  Pulse: (!) 59 (!) 58  Resp: (!) 22 (!) 21  Temp:    SpO2: 94% 95%     General: Well developed, elderly, no apparent distress  HEENT: NCAT, PERRLA, EOMI, Anicteic Sclera, mucous membranes dry  Neck: Supple  Cardiovascular: S1 S2 auscultated, +murmur. Regular rate and rhythm.  Respiratory: Clear to auscultation bilaterally with equal chest rise  Abdomen: Soft, nontender, nondistended, + bowel sounds  Extremities: warm dry without cyanosis clubbing or edema  Neuro: Cannot fully assess. Opens eyes, mumbles. Does not follow commands. Left facial droop  Skin: Without rashes exudates or nodules  Psych: Cannot assess  Labs on Admission: I have personally reviewed following labs and imaging studies CBC: Recent Labs  Lab 06/25/18 2024 06/29/18 1215  WBC 4.7 4.7  NEUTROABS 2.9 2.9  HGB 10.5* 11.6*  HCT 32.6* 35.3*  MCV 99.1 95.9  PLT 146* 025   Basic Metabolic Panel: Recent Labs  Lab 06/25/18 2024 06/26/18 0039 06/29/18 1215  NA 139  --  141  K 5.2* 4.4 4.3  CL 104  --  101  CO2 26  --  26  GLUCOSE 97  --  101*  BUN 34*  --  38*  CREATININE 1.20  --  2.20*  CALCIUM 8.4*  --  9.1   GFR: Estimated Creatinine Clearance: 21.4 mL/min (A) (by C-G formula based on SCr of 2.2 mg/dL (H)). Liver Function Tests: Recent Labs  Lab 06/25/18 2024 06/29/18 1215  AST 19 31  ALT 12 11  ALKPHOS 75 64  BILITOT 1.0 1.1  PROT 6.5 7.6    ALBUMIN 3.2* 3.8   No results for input(s): LIPASE, AMYLASE in the last 168 hours. No results for input(s): AMMONIA in the last 168 hours. Coagulation Profile: Recent Labs  Lab 06/29/18 1215  INR 1.24   Cardiac Enzymes: No results for input(s): CKTOTAL, CKMB,  CKMBINDEX, TROPONINI in the last 168 hours. BNP (last 3 results) No results for input(s): PROBNP in the last 8760 hours. HbA1C: No results for input(s): HGBA1C in the last 72 hours. CBG: Recent Labs  Lab 06/29/18 1158 06/29/18 1350 06/29/18 1542  GLUCAP 89 74 97   Lipid Profile: No results for input(s): CHOL, HDL, LDLCALC, TRIG, CHOLHDL, LDLDIRECT in the last 72 hours. Thyroid Function Tests: No results for input(s): TSH, T4TOTAL, FREET4, T3FREE, THYROIDAB in the last 72 hours. Anemia Panel: No results for input(s): VITAMINB12, FOLATE, FERRITIN, TIBC, IRON, RETICCTPCT in the last 72 hours. Urine analysis:    Component Value Date/Time   COLORURINE YELLOW 06/29/2018 1310   APPEARANCEUR CLEAR 06/29/2018 1310   LABSPEC 1.013 06/29/2018 1310   PHURINE 5.0 06/29/2018 1310   GLUCOSEU NEGATIVE 06/29/2018 1310   HGBUR NEGATIVE 06/29/2018 1310   BILIRUBINUR NEGATIVE 06/29/2018 1310   KETONESUR NEGATIVE 06/29/2018 1310   PROTEINUR NEGATIVE 06/29/2018 1310   UROBILINOGEN 0.2 05/09/2015 1523   NITRITE NEGATIVE 06/29/2018 1310   LEUKOCYTESUR NEGATIVE 06/29/2018 1310   Sepsis Labs: @LABRCNTIP (procalcitonin:4,lacticidven:4) )No results found for this or any previous visit (from the past 240 hour(s)).   Radiological Exams on Admission: Ct Head Wo Contrast  Result Date: 06/29/2018 CLINICAL DATA:  Recent lethargy. EXAM: CT HEAD WITHOUT CONTRAST TECHNIQUE: Contiguous axial images were obtained from the base of the skull through the vertex without intravenous contrast. COMPARISON:  06/25/2018 and 02/23/2018 as well as neck CT 02/23/2018 FINDINGS: Brain: Examination demonstrates mild age related atrophic change and chronic  ischemic microvascular disease. Ventricles and cisterns are otherwise within normal. Old lacune infarct over the left lentiform nucleus and right perisylvian region. No evidence of mass, mass effect, shift of midline structures or acute hemorrhage. No evidence of acute infarction. Vascular: No hyperdense vessel or unexpected calcification. Skull: Normal. Negative for fracture or focal lesion. Sinuses/Orbits: Orbits are normal. There is minimal opacification over the ethmoid air cells. Mastoid air cells are clear. Other: Well-defined oval mass superficial to the anterior aspect of the left parotid gland abutting the skin surface unchanged and felt to be dermatologic in nature by previous neck CT. IMPRESSION: No acute findings. Age related atrophic change and chronic ischemic microvascular disease. Minimal chronic sinus inflammatory change. Stable subcutaneous mass just superficial to the anterior aspect of the left parotid gland. Electronically Signed   By: Marin Olp M.D.   On: 06/29/2018 14:46    EKG: Independently reviewed.  Ventricularly  paced, rate 60  Assessment/Plan  Left facial droop and acute metabolic encephalopathy vs  -Patient presented with left-sided facial droop as well as altered mental status.  Per daughters he has been speaking "out of his head" and acting inappropriately, alternating with periods of lethargy and unresponsiveness. -CT head showed no acute findings (of note patient also had CT head on 06/25/2018 which is also unremarkable) -Patient had similar presentation back in April 2019 -?  TIA -MRI cannot be done due to pacemaker placement -Recently had echocardiogram -Carotid Doppler in April 2019 showed right ICA stenosis 80 to 99%, left ICA stenosis 40 to 59% -Patient was supposed to follow with vascular surgery however this did not occur -Aspirin, Plavix, statin (increased atorvastatin to 80 mg) -Neurology consulted and appreciated -Patient will be admitted to [facility] -UA unremarkable for infection (although patient was currently on ciprofloxacin which was started on 06/28/2017 at [facility] place; UA was negative at [facility] place) -Will obtain chest x-ray -Conduct neuro checks  Acute kidney injury on chronic  kidney disease, stage III -Creatinine on admission 2.2, baseline creatinine 1.3-1.5 -Will place on IV fluids and avoid nephrotoxic agents -Continue to monitor BMP  Chronic systolic and diastolic heart failure -currently appears to be euvolemic and dry -Echocardiogram 03/17/2018 showed EF of 45 to 50%, hypokinesis in the inferior myocardium. -Hold Lasix -Monitor intake and output, daily weights  Coronary artery disease -Status post CABG twice in 1989 and catheterization in 2013 -Continue aspirin, statin, Ranexa -Imdur and Coreg held  Paroxysmal atrial fibrillation -Not on anticoagulation due to history of GI bleed -Coreg currently held due to question of TIA  Hypothyroidism -Continue Synthroid  Hyperlipidemia -Continue statin  DVT prophylaxis: Lovenox  Code Status: Full  Family Communication: Daughters via phone.   Disposition Plan: To be determined   Consults called: Neurology   Admission status: Observation   Time spent: 70 minutes  Josiephine Simao D.O. [facility]  Between 7am to 7pm - Please see pager noted on [email or web address]  After 7pm go to [email or web address] 06/29/2018, 5:29 PM

## 2018-06-29 NOTE — ED Provider Notes (Signed)
[place] DEPT Provider Note   CSN: [id number] Arrival date & time: 06/29/18  1129     History   Chief Complaint Chief Complaint  Patient presents with  . Fatigue  . Decrease in LOC    HPI John Peck is a 84 y.o. male.  HPI    84 year old male with decreased mental status.  Unclear onset.  I am actually familiar with patient from an ER visit last month.  I saw him for change in mental status which in retrospect seems pretty mild to how he is right now. He is currently very hard to arouse. Will only open eyes when yelled at or with tactile stimuli. Will mumble some nonsensical answers. No reported trauma.   Past Medical History:  Diagnosis Date  . Anemia   . Arthritis   . CAD (coronary artery disease)    a. CABG '89; b. PCI '04; c. 11/2015 Cath/PCI: LM 20ost, LAD 142m, LCX 30p, OM2 40, RCA 30p, 10p ISR, 90d (3.0x12 Synergy DES), AM 90, VG->OM2 known to be 100, LIMA->LAD not injected, patent in 2015.  . Carotid stenosis, right   . Chronic combined systolic and diastolic CHF (congestive heart failure) ([place])   . CKD (chronic kidney disease), stage III ([place])   . H/O: GI bleed    REMOTE HISTORY  . History of COPD   . Hyperlipidemia   . Hypertensive heart disease   . LBBB (left bundle branch block)   . Pacemaker Sept 2009   St Jude  . PAF (paroxysmal atrial fibrillation) (HCC)    a. not on anticoag due to prior GIB.  . Sick sinus syndrome [facility]) Sept 2009   ST Jude PTVDP  . Stroke [facility])     Patient Active Problem List   Diagnosis Date Noted  . Pressure injury of skin 05/25/2018  . Pleural effusion 05/24/2018  . Acute respiratory failure with hypoxia ([place]) 05/24/2018  . Acute on chronic combined systolic and diastolic CHF (congestive heart failure) ([place]) 03/14/2018  . COPD clinical dx/ no pfts on record 03/14/2018  . H/O: GI bleed 03/14/2018  . CKD (chronic kidney disease), stage III ([place]) 03/14/2018  . Paroxysmal atrial  fibrillation ([place]) 03/14/2018  . Hypothyroidism 03/14/2018  . Carotid stenosis, right 03/14/2018  . Facial droop 02/22/2018  . Pancytopenia ([place]) 02/22/2018  . Generalized weakness   . Hypertensive heart disease   . Hyperlipidemia   . Stented coronary artery   . Coronary artery disease involving native coronary artery of native heart with unstable angina pectoris ([place])   . Reactive airway disease 11/27/2015  . CAD in native artery 09/03/2015  . Weakness 05/09/2015  . Hyperlipidemia LDL goal <70 02/28/2015  . Atherosclerosis of lower extremity with claudication ([place]) 07/17/2014  . PAF (paroxysmal atrial fibrillation) ([place]) 03/24/2013  . Lower extremity edema 03/24/2013  . Sinoatrial node dysfunction ([place]) 08/17/2012  . History of iron deficiency anemia 09/18/2011  . Essential hypertension 07/07/2009  . INTERMEDIATE CORONARY SYNDROME 07/07/2009  . S/P CABG x 2 '89. RCA stent'04. last cath Jan 2013 07/07/2009  . PACEMAKER, PERMANENT- St Jude Sept 2009 07/07/2009  . Sick sinus syndrome ([place]) 07/10/2008  . Pacemaker 07/10/2008    Past Surgical History:  Procedure Laterality Date  . CARDIAC CATHETERIZATION  11/2011   EF 50%; significant native CAD w/80% stenosis of the LAD after 2nd giagonal vessel and septal perforating artery w/total occlusion of the mid left anterior descendiung.; 60-70% ostiasl stenosis in the circumflex vessel followed by  40% proximal stenosis and 70-80% distal circumflex stenosis;   . CARDIAC CATHETERIZATION N/A 11/29/2015   Procedure: Left Heart Cath and Cors/Grafts Angiography;  Surgeon: Burnell Blanks, MD;  Location: [place] CV LAB;  Service: Cardiovascular;  Laterality: N/A;  . CARDIAC CATHETERIZATION N/A 11/29/2015   Procedure: Coronary Stent Intervention;  Surgeon: Burnell Blanks, MD;  Location: [place] CV LAB;  Service: Cardiovascular;  Laterality: N/A;  . CATARACT EXTRACTION  2004  . CORONARY ANGIOGRAPHY N/A 02/25/2017   Procedure:  Coronary Angiography;  Surgeon: Belva Crome, MD;  Location: [place] CV LAB;  Service: Cardiovascular;  Laterality: N/A;  . CORONARY ANGIOPLASTY WITH STENT PLACEMENT  2004   RCA  . CORONARY ARTERY BYPASS GRAFT  1989   had LIMA to his LAD, a vein to the circumflex. In 2004 underwent stenting to his right coronary artery.  . CORONARY STENT INTERVENTION N/A 02/24/2017   Procedure: Coronary Stent Intervention;  Surgeon: Wellington Hampshire, MD;  Location: [place] CV LAB;  Service: Cardiovascular;  Laterality: N/A;  cfx  . HEMORRHOID SURGERY    . INSERT / REPLACE / REMOVE PACEMAKER  07/17/08   DUAL-CHAMBER; PPM-ST.JUDE MEDNET  . IR THORACENTESIS ASP PLEURAL SPACE W/IMG GUIDE  05/25/2018  . LEFT HEART CATH AND CORS/GRAFTS ANGIOGRAPHY N/A 02/24/2017   Procedure: Left Heart Cath and Cors/Grafts Angiography;  Surgeon: Wellington Hampshire, MD;  Location: [place] CV LAB;  Service: Cardiovascular;  Laterality: N/A;  . LEFT HEART CATHETERIZATION WITH CORONARY/GRAFT ANGIOGRAM N/A 12/10/2011   Procedure: LEFT HEART CATHETERIZATION WITH Beatrix Fetters;  Surgeon: Troy Sine, MD;  Location: [facility] CATH LAB;  Service: Cardiovascular;  Laterality: N/A;  . LEFT HEART CATHETERIZATION WITH CORONARY/GRAFT ANGIOGRAM N/A 01/26/2014   Procedure: LEFT HEART CATHETERIZATION WITH Beatrix Fetters;  Surgeon: Troy Sine, MD;  Location: [facility] CATH LAB;  Service: Cardiovascular;  Laterality: N/A;  . PPM GENERATOR CHANGEOUT N/A 12/15/2017   Procedure: PPM GENERATOR CHANGEOUT;  Surgeon: Evans Lance, MD;  Location: [place] CV LAB;  Service: Cardiovascular;  Laterality: N/A;  . PROSTATECTOMY          Home Medications    Prior to Admission medications   Medication Sig Start Date End Date Taking? Authorizing Provider  acetaminophen (TYLENOL) 325 MG tablet Take 650 mg by mouth 3 (three) times daily.   Yes [provider]  albuterol (PROVENTIL HFA;VENTOLIN HFA) 108 (90 Base) MCG/ACT inhaler  Inhale 2 puffs into the lungs every 4 (four) hours as needed for wheezing or shortness of breath (or coughing). 05/27/18  Yes Emokpae, Courage, MD  ALPRAZolam (XANAX) 0.25 MG tablet Take 1 tablet (0.25 mg total) by mouth 2 (two) times daily as needed for anxiety. 05/27/18  Yes Emokpae, Courage, MD  aspirin 81 MG tablet Take 81 mg by mouth daily.    Yes [provider]  atorvastatin (LIPITOR) 40 MG tablet Take 1 tablet (40 mg total) by mouth daily. Patient taking differently: Take 40 mg by mouth every evening.  09/18/15  Yes Troy Sine, MD  carvedilol (COREG) 12.5 MG tablet Take 1 tablet (12.5 mg total) by mouth 2 (two) times daily with a meal. 05/27/18  Yes Emokpae, Courage, MD  ciprofloxacin (CIPRO) 250 MG tablet Take 250 mg by mouth 2 (two) times daily. 06/28/18 07/01/18 Yes [provider]  clopidogrel (PLAVIX) 75 MG tablet Take 1 tablet (75 mg total) by mouth daily. 05/27/18  Yes Emokpae, Courage, MD  ezetimibe (ZETIA) 10 MG tablet Take 10  mg by mouth daily.   Yes [provider]  ferrous sulfate 325 (65 FE) MG tablet Take 1 tablet (325 mg total) by mouth 2 (two) times daily with a meal. 05/27/18  Yes Emokpae, Courage, MD  furosemide (LASIX) 20 MG tablet Take 2 tablets (40 mg total) by mouth 2 (two) times daily. Patient taking differently: Take 60 mg by mouth 2 (two) times daily.  05/27/18 05/27/19 Yes Emokpae, Courage, MD  ipratropium-albuterol (DUONEB) 0.5-2.5 (3) MG/3ML SOLN Take 3 mLs by nebulization 2 (two) times daily. Patient taking differently: Take 3 mLs by nebulization 3 (three) times daily.  02/24/18  Yes Mikhail, Velta Addison, DO  isosorbide mononitrate (IMDUR) 30 MG 24 hr tablet Take 3 tablets (90 mg total) by mouth daily. 05/28/18  Yes Emokpae, Courage, MD  latanoprost (XALATAN) 0.005 % ophthalmic solution Place 1 drop into both eyes 2 (two) times a week. On mondays and wednesdays 11/07/15  Yes [provider]  levothyroxine (SYNTHROID, LEVOTHROID) 125 MCG  tablet TAKE 1 TABLET BY MOUTH EVERY DAY BEFORE BREAKFAST Patient taking differently: Take 125 mcg by mouth daily before breakfast.  04/05/18  Yes Troy Sine, MD  mirtazapine (REMERON) 7.5 MG tablet Take 7.5 mg by mouth at bedtime.   Yes [provider]  pantoprazole (PROTONIX) 40 MG tablet Take 1 tablet (40 mg total) by mouth daily. 05/28/18  Yes Emokpae, Courage, MD  polyethylene glycol (MIRALAX / GLYCOLAX) packet Take 17 g by mouth daily. 05/27/18  Yes Emokpae, Courage, MD  ranolazine (RANEXA) 1000 MG SR tablet Take 1 tablet (1,000 mg total) by mouth 2 (two) times daily. 05/27/18  Yes Roxan Hockey, MD  senna (SENOKOT) 8.6 MG tablet Take 2 tablets by mouth 2 (two) times daily as needed for constipation.   Yes [provider]  tamsulosin (FLOMAX) 0.4 MG CAPS capsule Take 1 capsule (0.4 mg total) by mouth daily. 05/27/18  Yes Emokpae, Courage, MD  azithromycin (ZITHROMAX) 500 MG tablet Take 1 tablet (500 mg total) by mouth daily. Patient not taking: Reported on 06/02/2018 05/27/18   Roxan Hockey, MD  cephALEXin (KEFLEX) 500 MG capsule Take 1 capsule (500 mg total) by mouth 2 (two) times daily. Patient not taking: Reported on 06/29/2018 06/02/18   Virgel Manifold, MD  cephALEXin (KEFLEX) 500 MG capsule Take 1 capsule (500 mg total) by mouth 2 (two) times daily. Patient not taking: Reported on 06/29/2018 06/02/18   Virgel Manifold, MD    Family History Family History  Problem Relation Age of Onset  . Diabetes Father   . Stroke Sister   . Heart disease Sister   . Heart disease Brother   . Heart attack Brother   . Stroke Brother   . Hypertension Neg Hx     Social History Social History   Tobacco Use  . Smoking status: Former Smoker    Packs/day: 0.75    Years: 65.00    Pack years: 48.75    Types: Cigarettes    Last attempt to quit: 11/09/1997    Years since quitting: 20.6  . Smokeless tobacco: Former Systems developer    Quit date: 10/29/2008  Substance Use Topics  . Alcohol  use: No  . Drug use: No     Allergies   Patient has no known allergies.   Review of Systems Review of Systems  Level 5 caveat because of decreased mental status.   Physical Exam Updated Vital Signs BP (!) 154/65   Pulse 69   Temp (S) 97.9 F (36.6 C) (  Rectal)   Resp 19   Ht 5\' 9"  (1.753 m)   Wt 71.2 kg   SpO2 96%   BMI 23.18 kg/m   Physical Exam  Constitutional: He appears well-developed and well-nourished. No distress.  Laying in bed. Appears to be sleeping.   HENT:  Head: Normocephalic and atraumatic.  Eyes: Conjunctivae are normal. Right eye exhibits no discharge. Left eye exhibits no discharge.  Neck: Neck supple.  Cardiovascular: Normal rate and regular rhythm. Exam reveals no gallop and no friction rub.  Murmur heard. Pacer L chest  Pulmonary/Chest: Effort normal and breath sounds normal. No respiratory distress.  Abdominal: Soft. He exhibits no distension. There is no tenderness.  Neurological: A cranial nerve deficit is present.  Very drowsy. Opens eyes when speaking to him loudly. Appears to have L facial droop. Hard to tell because he is very drowsy but his attempts to answers questions sound dysarthric. Occasional myoclonic jerking of extremities and moving them all. Not following commands.   Skin: Skin is warm and dry.  Nursing note and vitals reviewed.    ED Treatments / Results  Labs (all labs ordered are listed, but only abnormal results are displayed) Labs Reviewed  PROTIME-INR - Abnormal; Notable for the following components:      Result Value   Prothrombin Time 15.5 (*)    All other components within normal limits  APTT - Abnormal; Notable for the following components:   aPTT 21 (*)    All other components within normal limits  CBC - Abnormal; Notable for the following components:   RBC 3.68 (*)    Hemoglobin 11.6 (*)    HCT 35.3 (*)    RDW 16.5 (*)    All other components within normal limits  COMPREHENSIVE METABOLIC PANEL - Abnormal;  Notable for the following components:   Glucose, Bld 101 (*)    BUN 38 (*)    Creatinine, Ser 2.20 (*)    GFR calc non Af Amer 24 (*)    GFR calc Af Amer 28 (*)    All other components within normal limits  BLOOD GAS, ARTERIAL - Abnormal; Notable for the following components:   pO2, Arterial 75.3 (*)    All other components within normal limits  URINE CULTURE  DIFFERENTIAL  URINALYSIS, ROUTINE W REFLEX MICROSCOPIC  I-STAT TROPONIN, ED  CBG MONITORING, ED  CBG MONITORING, ED  CBG MONITORING, ED    EKG EKG Interpretation  Date/Time:  Wednesday June 29 2018 11:51:34 EDT Ventricular Rate:  60 PR Interval:    QRS Duration: 182 QT Interval:  513 QTC Calculation: 513 R Axis:   -58 Text Interpretation:  Ventricular-paced rhythm No further analysis attempted due to paced rhythm Baseline wander in lead(s) V4 Confirmed by Virgel Manifold [id number]) on 06/29/2018 12:38:28 PM   Radiology Ct Head Wo Contrast  Result Date: 06/29/2018 CLINICAL DATA:  Recent lethargy. EXAM: CT HEAD WITHOUT CONTRAST TECHNIQUE: Contiguous axial images were obtained from the base of the skull through the vertex without intravenous contrast. COMPARISON:  06/25/2018 and 02/23/2018 as well as neck CT 02/23/2018 FINDINGS: Brain: Examination demonstrates mild age related atrophic change and chronic ischemic microvascular disease. Ventricles and cisterns are otherwise within normal. Old lacune infarct over the left lentiform nucleus and right perisylvian region. No evidence of mass, mass effect, shift of midline structures or acute hemorrhage. No evidence of acute infarction. Vascular: No hyperdense vessel or unexpected calcification. Skull: Normal. Negative for fracture or focal lesion. Sinuses/Orbits: Orbits are normal. There is  minimal opacification over the ethmoid air cells. Mastoid air cells are clear. Other: Well-defined oval mass superficial to the anterior aspect of the left parotid gland abutting the skin surface  unchanged and felt to be dermatologic in nature by previous neck CT. IMPRESSION: No acute findings. Age related atrophic change and chronic ischemic microvascular disease. Minimal chronic sinus inflammatory change. Stable subcutaneous mass just superficial to the anterior aspect of the left parotid gland. Electronically Signed   By: Marin Olp M.D.   On: 06/29/2018 14:46    Procedures Procedures (including critical care time)  Medications Ordered in ED Medications  sodium chloride 0.9 % bolus 500 mL (500 mLs Intravenous New Bag/Given 06/29/18 1324)     Initial Impression / Assessment and Plan / ED Course  I have reviewed the triage vital signs and the nursing notes.  Pertinent labs & imaging results that were available during my care of the patient were reviewed by me and considered in my medical decision making (see chart for details).     84 year old male with change of mental status. Two daughters subsequently arrived to the emergency room and providing additional history.  They describe delirious behavior over this past weekend (very hateful, hallucinating, laughing inappropriately, reaching for things that weren't there, not sleeping, talking about deceased people like they were still alive, etc). Yesterday he had seemed to have calmed down quite a bit to the point of being very drowsy. They report his nursing facility presumptively started cipro for possible UTI because of this.  One daughter yesterday thought his speech sounded slurred and though "he might have Bell's palsy" because of L facial droop. They are unsure if he they had given him any sedating medications recently aside from xanax which he is normally on.  I think Mr Bramblett had a CVA. He is not a candidate for acute intervention because unclear when exactly last known normal was. He may be coming down after period of likely delirium from what family describes but his speech sounds dysarthric the best I can tell and they  reports L facial droop is new finding. Hard to get good neuro exam otherwise with his level of participation.  CT head w/o acute findings. Has pacemaker.   Also AKI. He has continued to get his lasix and fluid restricted per daughters despite not eating/drinking well otherwise over the past several days.   Afebrile. UA looks ok. Listed hx of COPD. ABG looks reasonable.    Final Clinical Impressions(s) / ED Diagnoses   Final diagnoses:  Cerebrovascular accident (CVA), unspecified mechanism [facility])  Dehydration    ED Discharge Orders    None       Virgel Manifold, MD 06/29/18 1644

## 2018-06-29 NOTE — ED Notes (Signed)
Patient transported to CT 

## 2018-06-29 NOTE — ED Triage Notes (Signed)
Pt comes from [facility]. Pt is at baseline as far as being AO per family. Pt started being lethargic last Friday, pt was seen at [facility], and discharged. Pt presents today with same symptoms, only worsening lethargy. Pt uses wheelchair.

## 2018-06-29 NOTE — ED Notes (Signed)
Pt awake and reporting that he is cold. Pt found to be incontinent of urine. Linens changed and pt repositioned in bed. [facility] here for transfer.

## 2018-06-29 NOTE — ED Notes (Signed)
RN called RT to obtain ABG. RT in room with patient.

## 2018-06-29 NOTE — ED Notes (Signed)
Assumed care of pt from [facility]. Pt in room sleeping, on monitoring. Pt arousable to voice, but won't open his eyes. Will continue to monitor.

## 2018-06-30 ENCOUNTER — Observation Stay (HOSPITAL_COMMUNITY): Payer: Medicare HMO

## 2018-06-30 DIAGNOSIS — Z7982 Long term (current) use of aspirin: Secondary | ICD-10-CM | POA: Diagnosis not present

## 2018-06-30 DIAGNOSIS — J449 Chronic obstructive pulmonary disease, unspecified: Secondary | ICD-10-CM | POA: Diagnosis not present

## 2018-06-30 DIAGNOSIS — I130 Hypertensive heart and chronic kidney disease with heart failure and stage 1 through stage 4 chronic kidney disease, or unspecified chronic kidney disease: Secondary | ICD-10-CM | POA: Diagnosis not present

## 2018-06-30 DIAGNOSIS — R4182 Altered mental status, unspecified: Secondary | ICD-10-CM | POA: Diagnosis present

## 2018-06-30 DIAGNOSIS — I5042 Chronic combined systolic (congestive) and diastolic (congestive) heart failure: Secondary | ICD-10-CM | POA: Diagnosis not present

## 2018-06-30 DIAGNOSIS — I639 Cerebral infarction, unspecified: Secondary | ICD-10-CM | POA: Diagnosis not present

## 2018-06-30 DIAGNOSIS — B964 Proteus (mirabilis) (morganii) as the cause of diseases classified elsewhere: Secondary | ICD-10-CM | POA: Diagnosis not present

## 2018-06-30 DIAGNOSIS — E039 Hypothyroidism, unspecified: Secondary | ICD-10-CM | POA: Diagnosis not present

## 2018-06-30 DIAGNOSIS — G253 Myoclonus: Secondary | ICD-10-CM

## 2018-06-30 DIAGNOSIS — N183 Chronic kidney disease, stage 3 (moderate): Secondary | ICD-10-CM | POA: Diagnosis not present

## 2018-06-30 DIAGNOSIS — G459 Transient cerebral ischemic attack, unspecified: Secondary | ICD-10-CM | POA: Diagnosis not present

## 2018-06-30 DIAGNOSIS — I480 Paroxysmal atrial fibrillation: Secondary | ICD-10-CM | POA: Diagnosis not present

## 2018-06-30 DIAGNOSIS — Z8673 Personal history of transient ischemic attack (TIA), and cerebral infarction without residual deficits: Secondary | ICD-10-CM | POA: Diagnosis not present

## 2018-06-30 DIAGNOSIS — G9341 Metabolic encephalopathy: Secondary | ICD-10-CM | POA: Diagnosis not present

## 2018-06-30 DIAGNOSIS — Z7902 Long term (current) use of antithrombotics/antiplatelets: Secondary | ICD-10-CM | POA: Diagnosis not present

## 2018-06-30 DIAGNOSIS — I2510 Atherosclerotic heart disease of native coronary artery without angina pectoris: Secondary | ICD-10-CM | POA: Diagnosis not present

## 2018-06-30 DIAGNOSIS — N390 Urinary tract infection, site not specified: Secondary | ICD-10-CM | POA: Diagnosis not present

## 2018-06-30 DIAGNOSIS — Z87891 Personal history of nicotine dependence: Secondary | ICD-10-CM | POA: Diagnosis not present

## 2018-06-30 DIAGNOSIS — R29810 Facial weakness: Secondary | ICD-10-CM | POA: Diagnosis not present

## 2018-06-30 DIAGNOSIS — E785 Hyperlipidemia, unspecified: Secondary | ICD-10-CM | POA: Diagnosis not present

## 2018-06-30 DIAGNOSIS — Z951 Presence of aortocoronary bypass graft: Secondary | ICD-10-CM | POA: Diagnosis not present

## 2018-06-30 DIAGNOSIS — Z950 Presence of cardiac pacemaker: Secondary | ICD-10-CM | POA: Diagnosis not present

## 2018-06-30 LAB — CBC WITH DIFFERENTIAL/PLATELET
ABS IMMATURE GRANULOCYTES: 0 10*3/uL (ref 0.0–0.1)
BASOS ABS: 0 10*3/uL (ref 0.0–0.1)
Basophils Relative: 1 %
Eosinophils Absolute: 0.2 10*3/uL (ref 0.0–0.7)
Eosinophils Relative: 6 %
HCT: 31.8 % — ABNORMAL LOW (ref 39.0–52.0)
HEMOGLOBIN: 10.4 g/dL — AB (ref 13.0–17.0)
IMMATURE GRANULOCYTES: 0 %
LYMPHS PCT: 23 %
Lymphs Abs: 1 10*3/uL (ref 0.7–4.0)
MCH: 31.2 pg (ref 26.0–34.0)
MCHC: 32.7 g/dL (ref 30.0–36.0)
MCV: 95.5 fL (ref 78.0–100.0)
Monocytes Absolute: 0.6 10*3/uL (ref 0.1–1.0)
Monocytes Relative: 14 %
NEUTROS ABS: 2.3 10*3/uL (ref 1.7–7.7)
NEUTROS PCT: 56 %
Platelets: 129 10*3/uL — ABNORMAL LOW (ref 150–400)
RBC: 3.33 MIL/uL — AB (ref 4.22–5.81)
RDW: 15.9 % — ABNORMAL HIGH (ref 11.5–15.5)
WBC: 4.2 10*3/uL (ref 4.0–10.5)

## 2018-06-30 LAB — BASIC METABOLIC PANEL
Anion gap: 9 (ref 5–15)
BUN: 28 mg/dL — ABNORMAL HIGH (ref 8–23)
CO2: 25 mmol/L (ref 22–32)
Calcium: 8.6 mg/dL — ABNORMAL LOW (ref 8.9–10.3)
Chloride: 108 mmol/L (ref 98–111)
Creatinine, Ser: 1.62 mg/dL — ABNORMAL HIGH (ref 0.61–1.24)
GFR, EST AFRICAN AMERICAN: 41 mL/min — AB (ref >60)
GFR, EST NON AFRICAN AMERICAN: 35 mL/min — AB (ref >60)
Glucose, Bld: 85 mg/dL (ref 70–99)
POTASSIUM: 3.6 mmol/L (ref 3.5–5.1)
SODIUM: 142 mmol/L (ref 135–145)

## 2018-06-30 LAB — URINE CULTURE: Culture: NO GROWTH

## 2018-06-30 LAB — TSH: TSH: 0.614 u[IU]/mL (ref 0.350–4.500)

## 2018-06-30 LAB — AMMONIA: Ammonia: 18 umol/L (ref 9–35)

## 2018-06-30 MED ORDER — ATORVASTATIN CALCIUM 40 MG PO TABS
40.0000 mg | ORAL_TABLET | Freq: Every day | ORAL | Status: DC
  Administered 2018-07-01 – 2018-07-06 (×6): 40 mg via ORAL
  Filled 2018-06-30 (×6): qty 1

## 2018-06-30 NOTE — Evaluation (Signed)
Occupational Therapy Evaluation Patient Details Name: John Peck MRN: [id number] DOB: 1924-08-22 Today's Date: 06/30/2018    History of Present Illness 84 y.o. male with a history of hypertension, hyperlipidemia, stroke, A. Fib(not on anticoagulation due to GI bleed) who presents with progressive confusion and myoclonus.   Clinical Impression   Pt admitted with the above diagnoses and presents with below problem list. Pt will benefit from continued acute OT to address the below listed deficits and maximize independence with basic ADLs prior to d/c to venue below. Pt is from home. PTA pt was setup with UB ADLs, assist for LB ADLs, ambulated distance to/from bathroom with 4 wheeled walker and 2 people walking with him per daughters. Pt oriented to person, time, place. Decreased cognition noted. Pt reporting pain L>R feet (Faces 6/10, occasionally 8/10 with touch). Myoclonus noted.      Follow Up Recommendations  SNF    Equipment Recommendations  Other (comment)(defer to next venue)    Recommendations for Other Services PT consult;Speech consult     Precautions / Restrictions Precautions Precautions: Fall Precaution Comments: myoclonus BLE Restrictions Weight Bearing Restrictions: No      Mobility Bed Mobility Overal bed mobility: Needs Assistance Bed Mobility: Supine to Sit     Supine to sit: Mod assist;HOB elevated     General bed mobility comments: assist to powerup trunk  Transfers Overall transfer level: Needs assistance Equipment used: Rolling walker (2 wheeled) Transfers: Sit to/from [facility] Sit to Stand: Mod assist;From elevated surface Stand pivot transfers: Min assist       General transfer comment: EOB to recliner, elevated seat heights. assist to powerup, steady, and control descent. cues for hand placement .     Balance Overall balance assessment: Needs assistance Sitting-balance support: Bilateral upper extremity  supported;Feet supported Sitting balance-Leahy Scale: Fair     Standing balance support: Bilateral upper extremity supported Standing balance-Leahy Scale: Poor Standing balance comment: rw and min A to steady                           ADL either performed or assessed with clinical judgement   ADL Overall ADL's : Needs assistance/impaired Eating/Feeding: NPO   Grooming: Sitting;Minimal assistance   Upper Body Bathing: Minimal assistance;Sitting   Lower Body Bathing: Maximal assistance;Sit to/from stand   Upper Body Dressing : Sitting;Moderate assistance   Lower Body Dressing: Maximal assistance;Sit to/from stand   Toilet Transfer: Moderate assistance;Stand-pivot;BSC;RW Toilet Transfer Details (indicate cue type and reason): simulated with EOB>recliner Toileting- Clothing Manipulation and Hygiene: Maximal assistance;Sit to/from stand   Tub/ Banker: Walk-in shower;Moderate assistance;Stand-pivot;Rolling walker   Functional mobility during ADLs: Minimal assistance;Rolling walker(pivotal steps) General ADL Comments: Pt completed bed mobility and SPT to recliner.      Vision         Perception     Praxis      Pertinent Vitals/Pain Pain Assessment: Faces Faces Pain Scale: Hurts even more Pain Location: L>R foot; sensitive to touch Pain Descriptors / Indicators: Grimacing;Guarding Pain Intervention(s): Limited activity within patient's tolerance;Monitored during session;Repositioned     Hand Dominance Right   Extremity/Trunk Assessment Upper Extremity Assessment Upper Extremity Assessment: Generalized weakness;RUE deficits/detail;LUE deficits/detail RUE Deficits / Details: able to open and close twist top containers with no physical assist, difficulty completing thumb opposition to fingertips RUE Coordination: decreased fine motor LUE Deficits / Details: able to open and close twist top containers with no physical assist, difficulty completing  thumb opposition to fingertips LUE Coordination: decreased fine motor   Lower Extremity Assessment Lower Extremity Assessment: Defer to PT evaluation       Communication Communication Communication: HOH   Cognition Arousal/Alertness: Awake/alert Behavior During Therapy: Anxious;Flat affect Overall Cognitive Status: Impaired/Different from baseline Area of Impairment: Following commands;Awareness;Problem solving;Attention;Memory;Safety/judgement                   Current Attention Level: Sustained Memory: Decreased recall of precautions;Decreased short-term memory Following Commands: Follows one step commands consistently;Follows multi-step commands with increased time Safety/Judgement: Decreased awareness of deficits Awareness: Intellectual Problem Solving: Slow processing General Comments: Pt oriented to person, time, place. Pt reporting feeling like he has been having nightmares.    General Comments  Daughters present and involved in session.     Exercises     Shoulder Instructions      Home Living Family/patient expects to be discharged to:: Skilled nursing facility                                 Additional Comments: split level house, level entry, able to live on main floor.       Prior Functioning/Environment Level of Independence: Needs assistance  Gait / Transfers Assistance Needed: Daughter reports pt had been walking limited distances with PT, at home he has walker and +2 assist to walk to bathroom ADL's / Homemaking Assistance Needed: Daughter reports he can feed himself and complete UB b/d with setup, needs assist for LB ADLs    Comments: used 4 wheel walker        OT Problem List: Decreased strength;Decreased activity tolerance;Impaired balance (sitting and/or standing);Decreased coordination;Decreased cognition;Decreased safety awareness;Decreased knowledge of use of DME or AE;Decreased knowledge of precautions;Pain;Impaired UE  functional use      OT Treatment/Interventions: Self-care/ADL training;Neuromuscular education;DME and/or AE instruction;Therapeutic activities;Patient/family education;Balance training;Cognitive remediation/compensation    OT Goals(Current goals can be found in the care plan section) Acute Rehab OT Goals Patient Stated Goal: rehab if needed then home per daughters OT Goal Formulation: With patient/family Time For Goal Achievement: 07/14/18 Potential to Achieve Goals: Good ADL Goals Pt Will Perform Grooming: with set-up;sitting Pt Will Perform Upper Body Bathing: with set-up;sitting Pt Will Perform Lower Body Bathing: with mod assist;sit to/from stand Pt Will Perform Upper Body Dressing: with set-up;sitting Pt Will Perform Lower Body Dressing: with mod assist;sit to/from stand Pt Will Transfer to Toilet: with min assist;ambulating;bedside commode Pt Will Perform Toileting - Clothing Manipulation and hygiene: with min assist;sit to/from stand Pt/caregiver will Perform Home Exercise Program: Both right and left upper extremity;With written HEP provided  OT Frequency: Min 2X/week   Barriers to D/C:            Co-evaluation              AM-PAC PT "6 Clicks" Daily Activity     Outcome Measure Help from another person eating meals?: A Little(clinical judgement, NPO at this time) Help from another person taking care of personal grooming?: A Little Help from another person toileting, which includes using toliet, bedpan, or urinal?: A Lot Help from another person bathing (including washing, rinsing, drying)?: A Lot Help from another person to put on and taking off regular upper body clothing?: A Little Help from another person to put on and taking off regular lower body clothing?: A Lot 6 Click Score: 15   End of Session Equipment Utilized During Treatment: Gait belt;Rolling  walker  Activity Tolerance: Patient tolerated treatment well Patient left: in chair;with call bell/phone  within reach;with chair alarm set;with family/visitor present  OT Visit Diagnosis: Unsteadiness on feet (R26.81);Pain;Muscle weakness (generalized) (M62.81);Other abnormalities of gait and mobility (R26.89);Other symptoms and signs involving cognitive function                Time: 3151-7616 OT Time Calculation (min): 31 min Charges:  OT General Charges $OT Visit: 1 Visit OT Evaluation $OT Eval Moderate Complexity: 1 Mod OT Treatments $Self Care/Home Management : 8-22 mins    Hortencia Pilar 06/30/2018, 10:03 AM

## 2018-06-30 NOTE — Consult Note (Signed)
Neurology Consultation Reason for Consult: Altered mental status Referring Physician: Curly [place]  CC: Altered mental status  History is obtained from: Daughter  HPI: John Peck is a 84 y.o. male with a history of hypertension, hyperlipidemia, stroke, A. Fib(not on anticoagulation due to GI bleed) who presents with progressive confusion and myoclonus.  He has been having worsening of his myoclonus over the past few weeks, though it is been present to some degree for several months.  He had a fairly severe episode a few days ago and was taken to the emergency department.  Over the past few days, he has been confused, having some hallucinations and then today he was essentially unarousable.   Per the ER note, he would arouse only with significant stimulus, like being yelled at or tactile stimuli.  He would not mumble some nonsensical answers.  Over the course of the afternoon, he has improved and is now able to answer simple questions and participate in history.  Of note, he was started on Cipro after they had concern for possible UTI.  He had a stroke in April which resulted in a left facial droop and it was felt like this might be slightly worse for the past few days.  LKW: Saturday tpa given?: no, outside timeframe    ROS: A 14 point ROS was performed and is negative except as noted in the HPI.  Past Medical History:  Diagnosis Date  . Anemia   . Arthritis   . CAD (coronary artery disease)    a. CABG '89; b. PCI '04; c. 11/2015 Cath/PCI: LM 20ost, LAD 122m, LCX 30p, OM2 40, RCA 30p, 10p ISR, 90d (3.0x12 Synergy DES), AM 90, VG->OM2 known to be 100, LIMA->LAD not injected, patent in 2015.  . Carotid stenosis, right   . Chronic combined systolic and diastolic CHF (congestive heart failure) ([place])   . CKD (chronic kidney disease), stage III ([place])   . H/O: GI bleed    REMOTE HISTORY  . History of COPD   . Hyperlipidemia   . Hypertensive heart disease   . LBBB (left bundle branch  block)   . Pacemaker Sept 2009   St Jude  . PAF (paroxysmal atrial fibrillation) ([facility])    a. not on anticoag due to prior GIB.  . Sick sinus syndrome [facility]) Sept 2009   ST Jude PTVDP  . Stroke [facility])      Family History  Problem Relation Age of Onset  . Diabetes Father   . Stroke Sister   . Heart disease Sister   . Heart disease Brother   . Heart attack Brother   . Stroke Brother   . Hypertension Neg Hx      Social History:  reports that he quit smoking about 20 years ago. His smoking use included cigarettes. He has a 48.75 pack-year smoking history. He quit smokeless tobacco use about 9 years ago. He reports that he does not drink alcohol or use drugs.   Exam: Current vital signs: BP (!) 152/81 (BP Location: Right Arm)   Pulse 65   Temp 98.5 F (36.9 C) (Oral)   Resp 16   Ht 5\' 9"  (1.753 m)   Wt 65.3 kg   SpO2 99%   BMI 21.26 kg/m  Vital signs in last 24 hours: Temp:  [97.7 F (36.5 C)-98.5 F (36.9 C)] 98.5 F (36.9 C) (08/22 0000) Pulse Rate:  [53-71] 65 (08/22 0000) Resp:  [15-31] 16 (08/22 0000) BP: (116-199)/(53-81) 152/81 (08/22 0000) SpO2:  [  87 %-99 %] 99 % (08/22 0000) Weight:  [65.3 kg-71.2 kg] 65.3 kg (08/21 2156)   Physical Exam  Constitutional: Appears well-developed and well-nourished.  Psych: Affect appropriate to situation Eyes: No scleral injection HENT: No OP obstrucion Head: Normocephalic.  Cardiovascular: Normal rate and regular rhythm.  Respiratory: Effort normal, non-labored breathing GI: Soft.  No distension. There is no tenderness.  Skin: WDI  Neuro: Mental Status: Patient is awake, alert, oriented to person, place, month, year, and situation.   Cranial Nerves: II: Visual Fields are full. Pupils are equal, round, and reactive to light.   III,IV, VI: EOMI without ptosis or diploplia.  V: Facial sensation is symmetric to temperature VII: Facial movement difficult to assess due to poor dentition, but he may have a very mild left  facial weakness. VIII: hearing is intact to voice X: Uvula elevates symmetrically XI: Shoulder shrug is symmetric. XII: tongue is midline without atrophy or fasciculations.  Motor: Tone is normal. Bulk is normal. 5/5 strength was present in all four extremities.  Sensory: Sensation is symmetric to light touch and temperature in the arms and legs.Cerebellar: No clear ataxia  I have reviewed labs in epic and the results pertinent to this consultation are: ABG-hypoxemia at 75 Creatinine 2.2, acutely elevated from 1.2  I have reviewed the images obtained: CT head-unremarkable  Impression: 84 year old with progressive delirium in the setting of chronic hypoxemia, acute kidney injury.  His myoclonus could very well be due to his chronic hypoxemia, but Ranexa also has been suggested as possibly causing this.  If his AKI were to cause increased blood levels of Ranexa, this could account for increased myoclonus. AKI alone, could cause worsening myoclonus as well.   With a severe alteration in mental status today that relatively quickly cleared, I do think I would like to evaluate for seizure as well and we will get an EEG.  Recommendations: 1) Could consider holding ranexa to see if mycoclonus imprveos. 2) address AKI per IM team 3) ammonia, TSH 4) EEG   Roland Rack, MD [facility] [phone]  If 7pm- 7am, please page neurology on call as listed in [place].

## 2018-06-30 NOTE — Evaluation (Signed)
Physical Therapy Evaluation Patient Details Name: John Peck MRN: [id number] DOB: [birth year] Today's Date: 06/30/2018   History of Present Illness  84 y.o. male with a history of hypertension, hyperlipidemia, stroke, A. Fib(not on anticoagulation due to GI bleed) who presents with progressive confusion and myoclonus.  Clinical Impression  Patient presents with decreased independence with mobility due to generalized weakness and tremors/myoclonus.  He will benefit from skilled PT in the acute setting to allow return home with family support after possibly SNF level rehab stay.    Follow Up Recommendations SNF;Supervision/Assistance - 24 hour    Equipment Recommendations  None recommended by PT    Recommendations for Other Services       Precautions / Restrictions Precautions Precautions: Fall Precaution Comments: myoclonus BLE      Mobility  Bed Mobility               General bed mobility comments: up in chair  Transfers Overall transfer level: Needs assistance Equipment used: Rolling walker (2 wheeled) Transfers: Sit to/from Stand Sit to Stand: Min assist         General transfer comment: increased time, but able to push up from chair with assist for safety/balance  Ambulation/Gait Ambulation/Gait assistance: Min assist;+2 safety/equipment Gait Distance (Feet): 60 Feet(x 2) Assistive device: Rolling walker (2 wheeled) Gait Pattern/deviations: Step-through pattern;Decreased stride length;Trunk flexed     General Gait Details: at one point with increased speed; episode of R knee buckling and pt requested to rest, then walked another bout in hallway, mild LE tremor/myoclonus with ambulation  Stairs            Wheelchair Mobility    Modified Rankin (Stroke Patients Only)       Balance     Sitting balance-Leahy Scale: Fair     Standing balance support: Bilateral upper extremity supported Standing balance-Leahy Scale: Poor Standing balance  comment: UE support for standing balance                             Pertinent Vitals/Pain Faces Pain Scale: Hurts even more Pain Location: L>R great toe very tender Pain Descriptors / Indicators: Grimacing;Guarding Pain Intervention(s): Monitored during session;Repositioned    Home Living Family/patient expects to be discharged to:: Private residence Living Arrangements: Spouse/significant other;Children Available Help at Discharge: Family;Available 24 hours/day Type of Home: House Home Access: Level entry(at back door)     Home Layout: Multi-level;Able to live on main level with bedroom/bathroom Home Equipment: Walker - 4 wheels;Bedside commode;Cane - single point;Shower seat;Walker - 2 wheels;Wheelchair - manual;Hospital bed      Prior Function Level of Independence: Needs assistance   Gait / Transfers Assistance Needed: states he had help to walk with cane in the home, used w/c for community           Hand Dominance   Dominant Hand: Right    Extremity/Trunk Assessment   Upper Extremity Assessment Upper Extremity Assessment: Defer to OT evaluation    Lower Extremity Assessment Lower Extremity Assessment: LLE deficits/detail;RLE deficits/detail RLE Deficits / Details: AROM WFL, strength at least 3/5, noted at times some jerking of legs, but only after about 50-60' of ambulation LLE Deficits / Details: AROM WFL, strength at least 3/5, noted at times some jerking of legs, but only after about 50-60' of ambulation    Cervical / Trunk Assessment Cervical / Trunk Assessment: Kyphotic  Communication   Communication: HOH  Cognition Arousal/Alertness: Awake/alert Behavior During  Therapy: WFL for tasks assessed/performed   Area of Impairment: Following commands;Safety/judgement;Problem solving                   Current Attention Level: Selective   Following Commands: Follows one step commands consistently;Follows multi-step commands with increased  time Safety/Judgement: Decreased awareness of safety   Problem Solving: Decreased initiation;Requires verbal cues;Slow processing;Requires tactile cues        General Comments      Exercises     Assessment/Plan    PT Assessment Patient needs continued PT services  PT Problem List Decreased strength;Decreased mobility;Decreased safety awareness;Decreased balance;Decreased knowledge of use of DME;Decreased activity tolerance       PT Treatment Interventions DME instruction;Therapeutic activities;Therapeutic exercise;Gait training;Balance training;Functional mobility training    PT Goals (Current goals can be found in the Care Plan section)  Acute Rehab PT Goals Patient Stated Goal: rehab if needed then home per daughters PT Goal Formulation: With patient Time For Goal Achievement: 07/07/18 Potential to Achieve Goals: Good    Frequency Min 3X/week   Barriers to discharge        Co-evaluation               AM-PAC PT "6 Clicks" Daily Activity  Outcome Measure Difficulty turning over in bed (including adjusting bedclothes, sheets and blankets)?: A Little Difficulty moving from lying on back to sitting on the side of the bed? : A Lot Difficulty sitting down on and standing up from a chair with arms (e.g., wheelchair, bedside commode, etc,.)?: Unable Help needed moving to and from a bed to chair (including a wheelchair)?: A Little Help needed walking in hospital room?: A Little Help needed climbing 3-5 steps with a railing? : A Lot 6 Click Score: 14    End of Session Equipment Utilized During Treatment: Gait belt Activity Tolerance: Patient tolerated treatment well Patient left: with call bell/phone within reach;in chair;with chair alarm set   PT Visit Diagnosis: Other abnormalities of gait and mobility (R26.89);Muscle weakness (generalized) (M62.81)    Time: 0947-0962 PT Time Calculation (min) (ACUTE ONLY): 23 min   Charges:   PT Evaluation $PT Eval Moderate  Complexity: 1 Mod PT Treatments $Gait Training: 8-22 mins        [place], [place] [phone] 06/30/2018   Reginia Naas 06/30/2018, 1:14 PM

## 2018-06-30 NOTE — Progress Notes (Addendum)
EEG was normal. Given this, delirium is most likely secondary to AKI and chronic hypoxemia, myoclonus most likely secondary to hypoxemia.   Ammonia and TSH normal.   Electronically signed: Dr. Kerney Elbe

## 2018-06-30 NOTE — Progress Notes (Signed)
PROGRESS NOTE  ERIQUE KASER UDJ:[id number] DOB: 11-Jul-1924 DOA: 06/29/2018 PCP: Seward Carol, MD  HPI/Recap of past 24 hours: John Peck is a 84 y.o. male with a medical history of CHF, chronic kidney disease, paroxysmal atrial fibrillation not on anticoagulation, hypertension, hyperlipidemia, COPD, who presented to the emergency department with complaints of altered mental status. Information was provided by daughters via phone and I spoke to both daughters in person as well. Patient was seen normal on Sunday.  Monday he noted to have incoherent speech, profanity, and laughing inappropriately. At the nursing facility, [facility], it was thought that he could have a urinary tract infection and he was started on ciprofloxacin on 06/28/2018.  Per daughter, UA was negative. Daughter also reported noting a residual left sided facial droop which was worsening for the past couple of days. Daughter also reported worsening myoclonus over the past few weeks. Neurology was consulted and patient admitted for further management.   Today, pt reported feeling better, denies any new complaints. Spoke extensively with both daughters who reported that pt is DNR and supporting documents will be provided. Daughters also interested in palliative.   Assessment/Plan: Active Problems:   PAF (paroxysmal atrial fibrillation) (HCC)   CAD in native artery   Hyperlipidemia   Facial droop   Pacemaker   COPD clinical dx/ no pfts on record   CKD (chronic kidney disease), stage III (HCC)   TIA (transient ischemic attack)  Acute metabolic encephalopathy Improved ??TIA Vs AKI/dehydration, r/o source of infection Afebrile, no leukocytosis Ammonia level WNL, TSH WNL CT head showed no acute findings MRI cannot be done due to pacemaker placement Chest x-ray unremarkable, UA negative for infection, UC pending ECHO with EF of 40-50%, hypokinesis of inf myocardium Carotid Doppler in April 2019 showed right ICA  stenosis 80 to 99%, left ICA stenosis 40 to 59%. Pt unable to follow up with vascular surgery for possible stent placement Neurology on board, recommend EEG, consider holding her Ranexa to see if myoclonus improves EEG pending Continue aspirin, Plavix, statin Frequent neurochecks, monitor closely  AKI on CKD stage III Improving Creatinine on admission 2.2, baseline creatinine 1.3-1.5 Continue gentle IV fluids Daily BMP  Chronic systolic and diastolic heart failure Appears compensated Echocardiogram 03/17/2018 showed EF of 45 to 50%, hypokinesis in the inferior myocardium Continue to Hold Lasix Monitor intake and output, daily weights  CAD/PVD Chest pain free Status post CABG twice in 1989 and catheterization in 2013 Continue aspirin, statin, held Ranexa for now Imdur and Coreg held  Paroxysmal Afib Not on anticoagulation due to history of GI bleed Coreg currently held due to question of TIA  Hypothyroidism Continue Synthroid  Hyperlipidemia LDL at goal Continue statin   Code Status: DNR  Family Communication: Discussed extensively with both daughters, all questions answered  Disposition Plan: Back to SNF   Consultants:  Neurology  Procedures:  None  Antimicrobials:  None  DVT prophylaxis: Lovenox   Objective: Vitals:   06/30/18 0200 06/30/18 0329 06/30/18 0400 06/30/18 0800  BP: 132/61 122/60 112/73 (!) 110/41  Pulse: 60 63 (!) 59 64  Resp: 16 18 20    Temp: 98 F (36.7 C) 99.4 F (37.4 C) 98.1 F (36.7 C)   TempSrc: Oral Oral Oral   SpO2: 100% 97% 98% 98%  Weight:      Height:        Intake/Output Summary (Last 24 hours) at 06/30/2018 1135 Last data filed at 06/30/2018 0400 Gross per 24 hour  Intake  980 ml  Output -  Net 980 ml   Filed Weights   06/29/18 1142 06/29/18 2156  Weight: 71.2 kg 65.3 kg    Exam:   General: NAD  Cardiovascular: S1, S2 present  Respiratory: CTA B  Abdomen: Soft, nontender, nondistended, bowel  sounds present  Musculoskeletal: No pedal edema bilaterally  Skin: Normal  Psychiatry: Normal mood  Neuro: No focal neurologic deficits noted, 5/5 strength present in all 4 extremities   Data Reviewed: CBC: Recent Labs  Lab 06/25/18 2024 06/29/18 1215 06/30/18 0743  WBC 4.7 4.7 4.2  NEUTROABS 2.9 2.9 2.3  HGB 10.5* 11.6* 10.4*  HCT 32.6* 35.3* 31.8*  MCV 99.1 95.9 95.5  PLT 146* 158 149*   Basic Metabolic Panel: Recent Labs  Lab 06/25/18 2024 06/26/18 0039 06/29/18 1215 06/30/18 0743  NA 139  --  141 142  K 5.2* 4.4 4.3 3.6  CL 104  --  101 108  CO2 26  --  26 25  GLUCOSE 97  --  101* 85  BUN 34*  --  38* 28*  CREATININE 1.20  --  2.20* 1.62*  CALCIUM 8.4*  --  9.1 8.6*   GFR: Estimated Creatinine Clearance: 26.9 mL/min (A) (by C-G formula based on SCr of 1.62 mg/dL (H)). Liver Function Tests: Recent Labs  Lab 06/25/18 2024 06/29/18 1215  AST 19 31  ALT 12 11  ALKPHOS 75 64  BILITOT 1.0 1.1  PROT 6.5 7.6  ALBUMIN 3.2* 3.8   No results for input(s): LIPASE, AMYLASE in the last 168 hours. Recent Labs  Lab 06/30/18 0204  AMMONIA 18   Coagulation Profile: Recent Labs  Lab 06/29/18 1215  INR 1.24   Cardiac Enzymes: No results for input(s): CKTOTAL, CKMB, CKMBINDEX, TROPONINI in the last 168 hours. BNP (last 3 results) No results for input(s): PROBNP in the last 8760 hours. HbA1C: No results for input(s): HGBA1C in the last 72 hours. CBG: Recent Labs  Lab 06/29/18 1158 06/29/18 1350 06/29/18 1542 06/29/18 2102  GLUCAP 89 74 97 86   Lipid Profile: No results for input(s): CHOL, HDL, LDLCALC, TRIG, CHOLHDL, LDLDIRECT in the last 72 hours. Thyroid Function Tests: Recent Labs    06/30/18 0204  TSH 0.614   Anemia Panel: No results for input(s): VITAMINB12, FOLATE, FERRITIN, TIBC, IRON, RETICCTPCT in the last 72 hours. Urine analysis:    Component Value Date/Time   COLORURINE YELLOW 06/29/2018 1310   APPEARANCEUR CLEAR 06/29/2018  1310   LABSPEC 1.013 06/29/2018 1310   PHURINE 5.0 06/29/2018 1310   GLUCOSEU NEGATIVE 06/29/2018 1310   HGBUR NEGATIVE 06/29/2018 1310   BILIRUBINUR NEGATIVE 06/29/2018 1310   KETONESUR NEGATIVE 06/29/2018 1310   PROTEINUR NEGATIVE 06/29/2018 1310   UROBILINOGEN 0.2 05/09/2015 1523   NITRITE NEGATIVE 06/29/2018 1310   LEUKOCYTESUR NEGATIVE 06/29/2018 1310   Sepsis Labs: @LABRCNTIP (procalcitonin:4,lacticidven:4)  ) Recent Results (from the past 240 hour(s))  Urine culture     Status: None   Collection Time: 06/29/18 11:55 AM  Result Value Ref Range Status   Specimen Description   Final    URINE, CATHETERIZED Performed at [facility], [place] [address], [place], [place] [address]    Special Requests   Final    NONE Performed at [facility], [place] [address] [place], [place] [address]    Culture   Final    NO GROWTH Performed at [facility], [place] [address], [place], [place] [address]    Report Status  06/30/2018 FINAL  Final      Studies: Dg Chest 2 View  Result Date: 06/29/2018 CLINICAL DATA:  Left-sided weakness and fatigue EXAM: CHEST - 2 VIEW COMPARISON:  06/25/2018, 06/02/2018, 05/24/2018 FINDINGS: Left-sided pacing device as before. Post sternotomy changes. Small bilateral pleural effusions without significant change. Stable enlarged cardiomediastinal silhouette with mild vascular congestion and edema. No pneumothorax. Skin fold artifact over the right chest. IMPRESSION: 1. Similar appearance of small bilateral pleural effusions, cardiomegaly, and mild interstitial edema. Electronically Signed   By: Donavan Foil M.D.   On: 06/29/2018 23:23   Ct Head Wo Contrast  Result Date: 06/29/2018 CLINICAL DATA:  Recent lethargy. EXAM: CT HEAD WITHOUT CONTRAST TECHNIQUE: Contiguous axial images were obtained from the base of the skull through the vertex without intravenous contrast. COMPARISON:  06/25/2018 and 02/23/2018 as well as neck CT  02/23/2018 FINDINGS: Brain: Examination demonstrates mild age related atrophic change and chronic ischemic microvascular disease. Ventricles and cisterns are otherwise within normal. Old lacune infarct over the left lentiform nucleus and right perisylvian region. No evidence of mass, mass effect, shift of midline structures or acute hemorrhage. No evidence of acute infarction. Vascular: No hyperdense vessel or unexpected calcification. Skull: Normal. Negative for fracture or focal lesion. Sinuses/Orbits: Orbits are normal. There is minimal opacification over the ethmoid air cells. Mastoid air cells are clear. Other: Well-defined oval mass superficial to the anterior aspect of the left parotid gland abutting the skin surface unchanged and felt to be dermatologic in nature by previous neck CT. IMPRESSION: No acute findings. Age related atrophic change and chronic ischemic microvascular disease. Minimal chronic sinus inflammatory change. Stable subcutaneous mass just superficial to the anterior aspect of the left parotid gland. Electronically Signed   By: Marin Olp M.D.   On: 06/29/2018 14:46    Scheduled Meds: . aspirin  81 mg Oral Daily  . atorvastatin  80 mg Oral Daily  . clopidogrel  75 mg Oral Daily  . enoxaparin (LOVENOX) injection  30 mg Subcutaneous Q24H  . ezetimibe  10 mg Oral Daily  . latanoprost  1 drop Both Eyes Once per day on Mon Thu  . levothyroxine  125 mcg Oral QAC breakfast  . pantoprazole  40 mg Oral Daily  . tamsulosin  0.4 mg Oral Daily    Continuous Infusions: . sodium chloride 75 mL/hr at 06/30/18 0031     LOS: 0 days     Alma Friendly, MD [facility]   If 7PM-7AM, please contact night-coverage [email or web address] Password [facility] 06/30/2018, 11:35 AM

## 2018-06-30 NOTE — Progress Notes (Signed)
EEG completed; results pending.    

## 2018-06-30 NOTE — Procedures (Signed)
[place] A. Merlene Laughter, MD     [email or web address]           HISTORY: The patient is a 84 year old male who presents with altered mental status.  The study is being done to evaluate for seizures as the etiology.  MEDICATIONS: Scheduled Meds: . aspirin  81 mg Oral Daily  . [START ON 07/01/2018] atorvastatin  40 mg Oral Daily  . clopidogrel  75 mg Oral Daily  . enoxaparin (LOVENOX) injection  30 mg Subcutaneous Q24H  . ezetimibe  10 mg Oral Daily  . latanoprost  1 drop Both Eyes Once per day on Mon Thu  . levothyroxine  125 mcg Oral QAC breakfast  . pantoprazole  40 mg Oral Daily  . tamsulosin  0.4 mg Oral Daily   Continuous Infusions: . sodium chloride 75 mL/hr at 06/30/18 0031   PRN Meds:.acetaminophen **OR** acetaminophen (TYLENOL) oral liquid 160 mg/5 mL **OR** acetaminophen, albuterol, senna  Prior to Admission medications   Medication Sig Start Date End Date Taking? Authorizing Provider  acetaminophen (TYLENOL) 325 MG tablet Take 650 mg by mouth 3 (three) times daily.   Yes [provider]  albuterol (PROVENTIL HFA;VENTOLIN HFA) 108 (90 Base) MCG/ACT inhaler Inhale 2 puffs into the lungs every 4 (four) hours as needed for wheezing or shortness of breath (or coughing). 05/27/18  Yes Emokpae, Courage, MD  ALPRAZolam (XANAX) 0.25 MG tablet Take 1 tablet (0.25 mg total) by mouth 2 (two) times daily as needed for anxiety. 05/27/18  Yes Emokpae, Courage, MD  aspirin 81 MG tablet Take 81 mg by mouth daily.    Yes [provider]  atorvastatin (LIPITOR) 40 MG tablet Take 1 tablet (40 mg total) by mouth daily. Patient taking differently: Take 40 mg by mouth every evening.  09/18/15  Yes Troy Sine, MD  carvedilol (COREG) 12.5 MG tablet Take 1 tablet (12.5 mg total) by mouth 2 (two) times daily with a meal. 05/27/18  Yes Emokpae, Courage, MD  ciprofloxacin (CIPRO) 250 MG tablet Take 250 mg by mouth 2 (two) times daily. 06/28/18 07/01/18 Yes [provider]  clopidogrel (PLAVIX) 75 MG tablet Take 1 tablet (75 mg total) by mouth daily. 05/27/18  Yes Emokpae, Courage, MD  ezetimibe (ZETIA) 10 MG tablet Take 10 mg by mouth daily.   Yes [provider]  ferrous sulfate 325 (65 FE) MG tablet Take 1 tablet (325 mg total) by mouth 2 (two) times daily with a meal. 05/27/18  Yes Emokpae, Courage, MD  furosemide (LASIX) 20 MG tablet Take 2 tablets (40 mg total) by mouth 2 (two) times daily. Patient taking differently: Take 60 mg by mouth 2 (two) times daily.  05/27/18 05/27/19 Yes Emokpae, Courage, MD  ipratropium-albuterol (DUONEB) 0.5-2.5 (3) MG/3ML SOLN Take 3 mLs by nebulization 2 (two) times daily. Patient taking differently: Take 3 mLs by nebulization 3 (three) times daily.  02/24/18  Yes Mikhail, Velta Addison, DO  isosorbide mononitrate (IMDUR) 30 MG 24 hr tablet Take 3 tablets (90 mg total) by mouth daily. 05/28/18  Yes Emokpae, Courage, MD  latanoprost (XALATAN) 0.005 % ophthalmic solution Place 1 drop into both eyes 2 (two) times a week. On mondays and wednesdays 11/07/15  Yes [provider]  levothyroxine (SYNTHROID, LEVOTHROID) 125 MCG tablet TAKE 1 TABLET BY MOUTH EVERY DAY BEFORE BREAKFAST Patient taking differently: Take 125 mcg by mouth daily before breakfast.  04/05/18  Yes Troy Sine, MD  mirtazapine (REMERON) 7.5 MG tablet Take 7.5 mg by  mouth at bedtime.   Yes [provider]  pantoprazole (PROTONIX) 40 MG tablet Take 1 tablet (40 mg total) by mouth daily. 05/28/18  Yes Emokpae, Courage, MD  polyethylene glycol (MIRALAX / GLYCOLAX) packet Take 17 g by mouth daily. 05/27/18  Yes Emokpae, Courage, MD  ranolazine (RANEXA) 1000 MG SR tablet Take 1 tablet (1,000 mg total) by mouth 2 (two) times daily. 05/27/18  Yes Roxan Hockey, MD  senna (SENOKOT) 8.6 MG tablet Take 2 tablets by mouth 2 (two) times daily as needed for constipation.   Yes [provider]  tamsulosin (FLOMAX) 0.4 MG CAPS capsule Take 1  capsule (0.4 mg total) by mouth daily. 05/27/18  Yes Emokpae, Courage, MD      ANALYSIS: A 16 channel recording using standard 10 20 measurements is conducted for 22 minutes.   There is a well-formed posterior dominant rhythm of 9.5-10 hertz which attenuates with eye opening.  There is high-frequency beta activity observed in frontal areas.  There is also increased myogenic artifact seen especially in the frontal areas.  Awake and drowsy activities are observed.  Photic stimulation and hyperventilation are not conducted.  There is no focal or lateralized slowing.  There is no epileptiform activity is observed.   IMPRESSION: 1.  This is a normal recording of awake and drowsy states.      Amy Gothard A. Merlene Laughter, M.D.  Diplomate, Tax adviser of Psychiatry and Neurology ( Neurology).

## 2018-07-01 ENCOUNTER — Observation Stay (HOSPITAL_COMMUNITY): Payer: Medicare HMO

## 2018-07-01 DIAGNOSIS — E785 Hyperlipidemia, unspecified: Secondary | ICD-10-CM | POA: Diagnosis not present

## 2018-07-01 DIAGNOSIS — I639 Cerebral infarction, unspecified: Secondary | ICD-10-CM | POA: Diagnosis not present

## 2018-07-01 DIAGNOSIS — Z515 Encounter for palliative care: Secondary | ICD-10-CM | POA: Diagnosis not present

## 2018-07-01 DIAGNOSIS — I5043 Acute on chronic combined systolic (congestive) and diastolic (congestive) heart failure: Secondary | ICD-10-CM

## 2018-07-01 DIAGNOSIS — E860 Dehydration: Secondary | ICD-10-CM

## 2018-07-01 DIAGNOSIS — J449 Chronic obstructive pulmonary disease, unspecified: Secondary | ICD-10-CM | POA: Diagnosis not present

## 2018-07-01 DIAGNOSIS — G459 Transient cerebral ischemic attack, unspecified: Secondary | ICD-10-CM | POA: Diagnosis not present

## 2018-07-01 DIAGNOSIS — I480 Paroxysmal atrial fibrillation: Secondary | ICD-10-CM | POA: Diagnosis not present

## 2018-07-01 DIAGNOSIS — R319 Hematuria, unspecified: Secondary | ICD-10-CM | POA: Diagnosis not present

## 2018-07-01 DIAGNOSIS — I5042 Chronic combined systolic (congestive) and diastolic (congestive) heart failure: Secondary | ICD-10-CM | POA: Diagnosis not present

## 2018-07-01 DIAGNOSIS — Z7189 Other specified counseling: Secondary | ICD-10-CM | POA: Diagnosis not present

## 2018-07-01 DIAGNOSIS — N183 Chronic kidney disease, stage 3 (moderate): Secondary | ICD-10-CM | POA: Diagnosis not present

## 2018-07-01 DIAGNOSIS — I2510 Atherosclerotic heart disease of native coronary artery without angina pectoris: Secondary | ICD-10-CM | POA: Diagnosis not present

## 2018-07-01 DIAGNOSIS — Z950 Presence of cardiac pacemaker: Secondary | ICD-10-CM | POA: Diagnosis not present

## 2018-07-01 DIAGNOSIS — R29810 Facial weakness: Secondary | ICD-10-CM | POA: Diagnosis not present

## 2018-07-01 LAB — BASIC METABOLIC PANEL
ANION GAP: 7 (ref 5–15)
BUN: 35 mg/dL — ABNORMAL HIGH (ref 8–23)
CALCIUM: 8.1 mg/dL — AB (ref 8.9–10.3)
CO2: 22 mmol/L (ref 22–32)
Chloride: 112 mmol/L — ABNORMAL HIGH (ref 98–111)
Creatinine, Ser: 1.64 mg/dL — ABNORMAL HIGH (ref 0.61–1.24)
GFR calc non Af Amer: 35 mL/min — ABNORMAL LOW (ref >60)
GFR, EST AFRICAN AMERICAN: 40 mL/min — AB (ref >60)
GLUCOSE: 113 mg/dL — AB (ref 70–99)
POTASSIUM: 3.4 mmol/L — AB (ref 3.5–5.1)
Sodium: 141 mmol/L (ref 135–145)

## 2018-07-01 LAB — URINALYSIS, ROUTINE W REFLEX MICROSCOPIC
BILIRUBIN URINE: NEGATIVE
Bacteria, UA: NONE SEEN
Glucose, UA: NEGATIVE mg/dL
Ketones, ur: NEGATIVE mg/dL
NITRITE: NEGATIVE
SPECIFIC GRAVITY, URINE: 1.017 (ref 1.005–1.030)
pH: 8 (ref 5.0–8.0)

## 2018-07-01 LAB — CBC WITH DIFFERENTIAL/PLATELET
Abs Immature Granulocytes: 0.1 10*3/uL (ref 0.0–0.1)
Basophils Absolute: 0 10*3/uL (ref 0.0–0.1)
Basophils Relative: 0 %
Eosinophils Absolute: 0 10*3/uL (ref 0.0–0.7)
Eosinophils Relative: 0 %
HCT: 29.6 % — ABNORMAL LOW (ref 39.0–52.0)
Hemoglobin: 9.5 g/dL — ABNORMAL LOW (ref 13.0–17.0)
Immature Granulocytes: 1 %
Lymphocytes Relative: 2 %
Lymphs Abs: 0.3 10*3/uL — ABNORMAL LOW (ref 0.7–4.0)
MCH: 31.5 pg (ref 26.0–34.0)
MCHC: 32.1 g/dL (ref 30.0–36.0)
MCV: 98 fL (ref 78.0–100.0)
Monocytes Absolute: 0.8 10*3/uL (ref 0.1–1.0)
Monocytes Relative: 6 %
Neutro Abs: 11.7 10*3/uL — ABNORMAL HIGH (ref 1.7–7.7)
Neutrophils Relative %: 91 %
Platelets: 117 10*3/uL — ABNORMAL LOW (ref 150–400)
RBC: 3.02 MIL/uL — ABNORMAL LOW (ref 4.22–5.81)
RDW: 16.3 % — ABNORMAL HIGH (ref 11.5–15.5)
WBC: 12.9 10*3/uL — ABNORMAL HIGH (ref 4.0–10.5)

## 2018-07-01 MED ORDER — SODIUM CHLORIDE 0.9 % IV SOLN
INTRAVENOUS | Status: DC
  Administered 2018-07-01: 14:00:00 via INTRAVENOUS

## 2018-07-01 NOTE — Consult Note (Signed)
Consultation Note Date: 07/01/2018   Patient Name: John Peck  DOB: 05-May-1924  MRN: [id number]  Age / Sex: 84 y.o., male  PCP: Seward Carol, MD Referring Physician: Alma Friendly, MD  Reason for Consultation: Establishing goals of care  HPI/Patient Profile: 84 y.o. male admitted on 06/29/2018 from [facility] with complaints of altered mental status. He has a past medical history of CHF, paroxysmal atrial fibrillation, hypertension, CKD, hyperlipidemia, COPD, Pacemaker, CAD, and TIA. During ED course patient was unable to provide medical history. Information was provided by daughters.  This reported patient was last seen normal on Sunday prior to admission.  Monday patient was noted to have incoherent speech, profanity, and laughing and appropriately.  Patient was prophylactically started on Cipro with assumption that he could possibly have a urinary tract infection.  Per the daughter UA results were negative at [facility] place.  Daughter also reported no in a residual left-sided facial droop which worsened over several days along with worsening myoclonus over the past few weeks.  On admission 2.2 baseline 1.3-1.5.  UA negative.  Since admission patient has been followed by neurology.  Palliative medicine team consulted for goals of care discussion.  Clinical Assessment and Goals of Care: I have reviewed medical records including lab results, imaging, Epic notes, and MAR, received report from the bedside RN, and assessed the patient. I then met at the bedside with daughters Angela Nevin and Dorian Pod to discuss diagnosis prognosis, GOC, EOL wishes, disposition and options.  Daughters report patient's mental status is back to baseline.  Patient is alert and oriented.  He does complain of some lower back pain and burning with urination.  Foley catheter is in place with tea colored urine.  Patient assisted back into bed  from recliner by nursing staff and myself.  Patient tolerated with walker assistance.  Patient did not engage in goals of care discussion deferred and information to come from his daughters.  I introduced Palliative Medicine as specialized medical care for people living with serious illness. It focuses on providing relief from the symptoms and stress of a serious illness. The goal is to improve quality of life for both the patient and the family.  We discussed a brief life review of the patient.  Daughters report patient is a twin.  He and his wife have been married for over 60+ years and has 3 children.  Family reports patient enjoys spending time with his family and friends.  As far as functional and nutritional status reports patient has resided at [facility] for the past month for rehabilitation.  He has been in and out of facilities over the past couple of months due to multiple health concerns such as stroke and pneumonia.  Family reports patient generally at baseline is able to assist with ADLs and also is able to recognize family and friends.  They feel he is now currently back to baseline compared to admission.  We discussed his current illness and what it means in the larger context of his on-going  co-morbidities.  Natural disease trajectory and expectations at EOL were discussed.  He verbalizes their awareness of his current illness.  They continue to feel that patient has some form of underlying infection which is causing him complications.  They remain hopeful for the best and that he will continue to show signs of improvement.  Daughter states that patient had a better day on yesterday compared to today.  States patient has been complaining of lower back pain and discomfort at the catheter site.  He seems somewhat more weaker today compared to yesterday.  I attempted to elicit values and goals of care important to the patient.    The difference between aggressive medical intervention and  comfort care was considered in light of the patient's goals of care.  Family would like to continue treating the treatable while hospitalized.  They are not interested in aggressive medical interventions and have expressed that patient is a DNR/DNI.  Remain hopeful that current treatments that are in place will be sufficient and promote improvement.    Hospice and Palliative Care services outpatient were explained and offered.  This time family is not interested in outpatient hospice services at this time.  However they have requested for patient to be followed by outpatient palliative care services at discharge.  Family is aware that if patient continues to show signs of decline once discharge or in the future, they may at any time discussed with outpatient palliative nurse to transition to hospice services.  Questions and concerns were addressed. The family was encouraged to call with questions or concerns.  PMT will continue to support holistically.  Primary decision maker: HCPOA-: Cathlyn Parsons (Daughter)    SUMMARY OF RECOMMENDATIONS    DNR/DNI-as confirmed by family  Continue to treat the treatable while hospitalized.  Family remains hopeful that patient will continue to show signs of improvement.  Once medically stable family is requesting patient to return to [facility].  CSW consult for outpatient palliative services at discharge.  Palliative medicine team will continue to support patient, family, and medical team during hospitalization as needed.  Code Status/Advance Care Planning:  DNR/DNI  Palliative Prophylaxis:   Aspiration, Bowel Regimen, Delirium Protocol, Frequent Pain Assessment, Oral Care and Turn Reposition  Additional Recommendations (Limitations, Scope, Preferences):  Full Scope Treatment-continue to treat the treatable while hospitalized.  Psycho-social/Spiritual:   Desire for further Chaplaincy support: No  Prognosis:   Unable to determine-guarded to  poor in the setting of decreased p.o. intake, hypertension, CVA, paroxysmal atrial fibrillation, pacemaker, hyperlipidemia, COPD, stage III chronic kidney disease, chronic combined systolic and diastolic CHF, arthritis, decreased mobility, and anemia.  Discharge Planning: Family is requesting patient be discharged back to [facility] place with outpatient palliative services.      Primary Diagnoses: Present on Admission: . CAD in native artery . CKD (chronic kidney disease), stage III ([place]) . COPD clinical dx/ no pfts on record . Facial droop . Hyperlipidemia . Pacemaker . PAF (paroxysmal atrial fibrillation) ([place]) . TIA (transient ischemic attack)   I have reviewed the medical record, interviewed the patient and family, and examined the patient. The following aspects are pertinent.  Past Medical History:  Diagnosis Date  . Anemia   . Arthritis   . CAD (coronary artery disease)    a. CABG '89; b. PCI '04; c. 11/2015 Cath/PCI: LM 20ost, LAD 178m LCX 30p, OM2 40, RCA 30p, 10p ISR, 90d (3.0x12 Synergy DES), AM 90, VG->OM2 known to be 100, LIMA->LAD not injected, patent in 2015.  .Marland Kitchen  Carotid stenosis, right   . Chronic combined systolic and diastolic CHF (congestive heart failure) ([place])   . CKD (chronic kidney disease), stage III ([place])   . H/O: GI bleed    REMOTE HISTORY  . History of COPD   . Hyperlipidemia   . Hypertensive heart disease   . LBBB (left bundle branch block)   . Pacemaker Sept 2009   St Jude  . PAF (paroxysmal atrial fibrillation) (HCC)    a. not on anticoag due to prior GIB.  . Sick sinus syndrome [facility]) Sept 2009   ST Jude PTVDP  . Stroke [facility])    Social History   Socioeconomic History  . Marital status: Married    Spouse name: Not on file  . Number of children: 5  . Years of education: Not on file  . Highest education level: Not on file  Occupational History  . Occupation: RETIRED    Employer: RETIRED    Comment: [employer]  . Financial  resource strain: Not hard at all  . Food insecurity:    Worry: Never true    Inability: Never true  . Transportation needs:    Medical: No    Non-medical: No  Tobacco Use  . Smoking status: Former Smoker    Packs/day: 0.75    Years: 65.00    Pack years: 48.75    Types: Cigarettes    Last attempt to quit: 11/09/1997    Years since quitting: 20.6  . Smokeless tobacco: Former Systems developer    Quit date: 10/29/2008  Substance and Sexual Activity  . Alcohol use: No  . Drug use: No  . Sexual activity: Not Currently  Lifestyle  . Physical activity:    Days per week: 0 days    Minutes per session: 0 min  . Stress: Not on file  Relationships  . Social connections:    Talks on phone: Never    Gets together: More than three times a week    Attends religious service: Never    Active member of club or organization: Yes    Attends meetings of clubs or organizations: Never    Relationship status: Married  Other Topics Concern  . Not on file  Social History Narrative  . Not on file   Family History  Problem Relation Age of Onset  . Diabetes Father   . Stroke Sister   . Heart disease Sister   . Heart disease Brother   . Heart attack Brother   . Stroke Brother   . Hypertension Neg Hx    Scheduled Meds: . aspirin  81 mg Oral Daily  . atorvastatin  40 mg Oral Daily  . clopidogrel  75 mg Oral Daily  . enoxaparin (LOVENOX) injection  30 mg Subcutaneous Q24H  . ezetimibe  10 mg Oral Daily  . latanoprost  1 drop Both Eyes Once per day on Mon Thu  . levothyroxine  125 mcg Oral QAC breakfast  . pantoprazole  40 mg Oral Daily  . tamsulosin  0.4 mg Oral Daily   Continuous Infusions: . sodium chloride 50 mL/hr at 07/01/18 1421   PRN Meds:.acetaminophen **OR** acetaminophen (TYLENOL) oral liquid 160 mg/5 mL **OR** acetaminophen, albuterol, senna Medications Prior to Admission:  Prior to Admission medications   Medication Sig Start Date End Date Taking? Authorizing Provider  acetaminophen  (TYLENOL) 325 MG tablet Take 650 mg by mouth 3 (three) times daily.   Yes [provider]  albuterol (PROVENTIL HFA;VENTOLIN HFA) 108 (  90 Base) MCG/ACT inhaler Inhale 2 puffs into the lungs every 4 (four) hours as needed for wheezing or shortness of breath (or coughing). 05/27/18  Yes Emokpae, Courage, MD  ALPRAZolam (XANAX) 0.25 MG tablet Take 1 tablet (0.25 mg total) by mouth 2 (two) times daily as needed for anxiety. 05/27/18  Yes Emokpae, Courage, MD  aspirin 81 MG tablet Take 81 mg by mouth daily.    Yes [provider]  atorvastatin (LIPITOR) 40 MG tablet Take 1 tablet (40 mg total) by mouth daily. Patient taking differently: Take 40 mg by mouth every evening.  09/18/15  Yes Troy Sine, MD  carvedilol (COREG) 12.5 MG tablet Take 1 tablet (12.5 mg total) by mouth 2 (two) times daily with a meal. 05/27/18  Yes Emokpae, Courage, MD  ciprofloxacin (CIPRO) 250 MG tablet Take 250 mg by mouth 2 (two) times daily. 06/28/18 07/01/18 Yes [provider]  clopidogrel (PLAVIX) 75 MG tablet Take 1 tablet (75 mg total) by mouth daily. 05/27/18  Yes Emokpae, Courage, MD  ezetimibe (ZETIA) 10 MG tablet Take 10 mg by mouth daily.   Yes [provider]  ferrous sulfate 325 (65 FE) MG tablet Take 1 tablet (325 mg total) by mouth 2 (two) times daily with a meal. 05/27/18  Yes Emokpae, Courage, MD  furosemide (LASIX) 20 MG tablet Take 2 tablets (40 mg total) by mouth 2 (two) times daily. Patient taking differently: Take 60 mg by mouth 2 (two) times daily.  05/27/18 05/27/19 Yes Emokpae, Courage, MD  ipratropium-albuterol (DUONEB) 0.5-2.5 (3) MG/3ML SOLN Take 3 mLs by nebulization 2 (two) times daily. Patient taking differently: Take 3 mLs by nebulization 3 (three) times daily.  02/24/18  Yes Mikhail, Velta Addison, DO  isosorbide mononitrate (IMDUR) 30 MG 24 hr tablet Take 3 tablets (90 mg total) by mouth daily. 05/28/18  Yes Emokpae, Courage, MD  latanoprost (XALATAN) 0.005 % ophthalmic  solution Place 1 drop into both eyes 2 (two) times a week. On mondays and wednesdays 11/07/15  Yes [provider]  levothyroxine (SYNTHROID, LEVOTHROID) 125 MCG tablet TAKE 1 TABLET BY MOUTH EVERY DAY BEFORE BREAKFAST Patient taking differently: Take 125 mcg by mouth daily before breakfast.  04/05/18  Yes Troy Sine, MD  mirtazapine (REMERON) 7.5 MG tablet Take 7.5 mg by mouth at bedtime.   Yes [provider]  pantoprazole (PROTONIX) 40 MG tablet Take 1 tablet (40 mg total) by mouth daily. 05/28/18  Yes Emokpae, Courage, MD  polyethylene glycol (MIRALAX / GLYCOLAX) packet Take 17 g by mouth daily. 05/27/18  Yes Emokpae, Courage, MD  ranolazine (RANEXA) 1000 MG SR tablet Take 1 tablet (1,000 mg total) by mouth 2 (two) times daily. 05/27/18  Yes Roxan Hockey, MD  senna (SENOKOT) 8.6 MG tablet Take 2 tablets by mouth 2 (two) times daily as needed for constipation.   Yes [provider]  tamsulosin (FLOMAX) 0.4 MG CAPS capsule Take 1 capsule (0.4 mg total) by mouth daily. 05/27/18  Yes Roxan Hockey, MD   No Known Allergies Review of Systems  Constitutional: Positive for activity change.  Genitourinary: Positive for dysuria.  Musculoskeletal: Positive for back pain.  Neurological: Positive for weakness.  All other systems reviewed and are negative.   Physical Exam  Constitutional: Vital signs are normal. He is cooperative.  Cardiovascular: Normal rate, regular rhythm, normal heart sounds and normal pulses.  Pulmonary/Chest: Effort normal. He has decreased breath sounds.  Abdominal: Soft. Normal appearance and bowel sounds are normal.  Musculoskeletal:  Generalized weakness with walker use and standby assist   Neurological: He is alert.  Nursing note and vitals reviewed.   Vital Signs: BP (!) 110/43 (BP Location: Right Arm)   Pulse (!) 59   Temp 98 F (36.7 C) (Oral)   Resp 18   Ht _0  (1.753 m)   Wt 65.3 kg   SpO2 93%   BMI 21.26 kg/m  Pain  Scale: 0-10   Pain Score: 0-No pain   SpO2: SpO2: 93 % O2 Device:SpO2: 93 % O2 Flow Rate: .   IO: Intake/output summary:   Intake/Output Summary (Last 24 hours) at 07/01/2018 1600 Last data filed at 07/01/2018 0815 Gross per 24 hour  Intake 240 ml  Output 550 ml  Net -310 ml    LBM:   Baseline Weight: Weight: 71.2 kg Most recent weight: Weight: 65.3 kg     Palliative Assessment/Data: PPS 30% OOB with assistance.    Time In: 1500 Time Out: 1630 Time Total: 90 min.   Greater than 50%  of this time was spent counseling and coordinating care related to the above assessment and plan.  Signed by:  Alda Lea, NP-BC Palliative Medicine Team  Phone: [phone] Fax: [phone] Pager: [phone] Amion: Bjorn Pippin    Please contact Palliative Medicine Team phone at [phone] for questions and concerns.  For individual provider: See Shea Evans

## 2018-07-01 NOTE — Progress Notes (Signed)
SLP Cancellation Note  Patient Details Name: SRIHITH AQUILINO MRN: [id number] DOB: 11-23-1923   Cancelled treatment:       Reason Eval/Treat Not Completed: Fatigue/lethargy limiting ability to participate. Family meeting with palliative care, states pt just went to sleep and requests SLP return at a later date. Will follow up.  Deneise Lever, [place], [place] Speech-Language Pathologist [place] 07/01/2018, 3:12 PM

## 2018-07-01 NOTE — Clinical Social Work Note (Signed)
Clinical Social Work Assessment  Patient Details  Name: John Peck MRN: [id number] Date of Birth: [birth year]  Date of referral:  07/01/18               Reason for consult:  Facility Placement                Permission sought to share information with:  Facility Sport and exercise psychologist, Family Supports Permission granted to share information::  Yes, Verbal Permission Granted  Name::     John Peck  Agency::  [facility]  Relationship::  Daughters  Sport and exercise psychologist Information:     Housing/Transportation Living arrangements for the past 2 months:  [place] of Information:  Scientist, water quality, Adult Children Patient Interpreter Needed:  None Criminal Activity/Legal Involvement Pertinent to Current Situation/Hospitalization:  No - Comment as needed Significant Relationships:  Adult Children Lives with:  Self, Facility Resident Do you feel safe going back to the place where you live?  Yes Need for family participation in patient care:  Yes (Comment)  Care giving concerns:  Patient from [facility] and family has no concerns about care received.   Social Worker assessment / plan:  CSW spoke with patient's daughters to confirm plan to return to [place]. CSW answered patient's daughters questions and discussed insurance coverage. CSW to follow.  Employment status:  Retired Nurse, adult PT Recommendations:  [place] / Referral to community resources:  [place]  Patient/Family's Response to care:  Patient's daughters agreeable for patient to return to [place].  Patient/Family's Understanding of and Emotional Response to Diagnosis, Current Treatment, and Prognosis:  Patient's daughters discussed how the plan is for patient to transition to long term care at [facility] when his rehab days run out. Patient's daughters asked questions about when the patient's rehab days would start over and appreciated information.    Emotional Assessment Appearance:  Appears stated age Attitude/Demeanor/Rapport:  Unable to Assess Affect (typically observed):  Unable to Assess Orientation:  Oriented to Self Alcohol / Substance use:  Not Applicable Psych involvement (Current and /or in the community):  No (Comment)  Discharge Needs  Concerns to be addressed:  Care Coordination Readmission within the last 30 days:  Yes Current discharge risk:  Physical Impairment, Dependent with Mobility Barriers to Discharge:  Continued Medical Work up, [place], [place] 07/01/2018, 4:28 PM

## 2018-07-01 NOTE — Progress Notes (Signed)
Physical Therapy Treatment Patient Details Name: John Peck MRN: [id number] DOB: 01/16/1924 Today's Date: 07/01/2018    History of Present Illness 84 y.o. male with a history of hypertension, hyperlipidemia, stroke, A. Fib(not on anticoagulation due to GI bleed) who presents with progressive confusion and myoclonus.    PT Comments    Pt asleep on arrival to room. Upon rousing, pt seemed initially confused but agreeable to work with therapy. Utilized rollator for ambulation into hall, as pt fatigues quickly and requires seated rest break. Patient would benefit from continued skilled PT to maximize functional independence and activity tolerance. Will continue to follow acutely.     Follow Up Recommendations  SNF;Supervision/Assistance - 24 hour     Equipment Recommendations  None recommended by PT    Recommendations for Other Services       Precautions / Restrictions Precautions Precautions: Fall Precaution Comments: myoclonus BLE Restrictions Weight Bearing Restrictions: Yes    Mobility  Bed Mobility Overal bed mobility: Needs Assistance Bed Mobility: Supine to Sit     Supine to sit: Mod assist;HOB elevated     General bed mobility comments: pt able to negotiate LEs, required mod A for trunk elevation  Transfers Overall transfer level: Needs assistance Equipment used: 4-wheeled walker Transfers: Sit to/from Stand Sit to Stand: Min assist         General transfer comment: min A to rise from EOB. Cues for hand placement and rollator safety.   Ambulation/Gait Ambulation/Gait assistance: Min assist;+2 safety/equipment Gait Distance (Feet): 50 Feet(x2) Assistive device: 4-wheeled walker Gait Pattern/deviations: Step-through pattern;Decreased stride length;Trunk flexed Gait velocity: decreased Gait velocity interpretation: <1.31 ft/sec, indicative of household ambulator General Gait Details: Pt with bil knee flexion during ambulation, reports feeling R LE  giving out, and required 1 seated restbreak secondary to fatigue. VC for postural control and RW proximity.   Stairs             Wheelchair Mobility    Modified Rankin (Stroke Patients Only)       Balance Overall balance assessment: Needs assistance Sitting-balance support: Bilateral upper extremity supported;Feet supported Sitting balance-Leahy Scale: Poor Sitting balance - Comments: Leaning on R elbow Postural control: Right lateral lean Standing balance support: Bilateral upper extremity supported Standing balance-Leahy Scale: Poor Standing balance comment: UE support for standing balance                            Cognition Arousal/Alertness: Awake/alert Behavior During Therapy: WFL for tasks assessed/performed Overall Cognitive Status: Impaired/Different from baseline Area of Impairment: Following commands;Safety/judgement;Problem solving                       Following Commands: Follows one step commands consistently;Follows multi-step commands with increased time Safety/Judgement: Decreased awareness of safety   Problem Solving: Decreased initiation;Requires verbal cues;Slow processing;Requires tactile cues        Exercises      General Comments General comments (skin integrity, edema, etc.): pt wearing condom cath, but bed linins and gown wet with urine. Pt sat EOB for gown change.       Pertinent Vitals/Pain Pain Assessment: Faces Faces Pain Scale: Hurts little more Pain Location: L>R great toe very tender Pain Descriptors / Indicators: Grimacing;Guarding Pain Intervention(s): Monitored during session;Limited activity within patient's tolerance;Repositioned    Home Living                      Prior  Function            PT Goals (current goals can now be found in the care plan section) Acute Rehab PT Goals Patient Stated Goal: rehab if needed then home per daughters PT Goal Formulation: With patient Time For Goal  Achievement: 07/07/18 Potential to Achieve Goals: Good Progress towards PT goals: Progressing toward goals    Frequency    Min 3X/week      PT Plan Current plan remains appropriate    Co-evaluation              AM-PAC PT "6 Clicks" Daily Activity  Outcome Measure  Difficulty turning over in bed (including adjusting bedclothes, sheets and blankets)?: Unable Difficulty moving from lying on back to sitting on the side of the bed? : Unable Difficulty sitting down on and standing up from a chair with arms (e.g., wheelchair, bedside commode, etc,.)?: Unable Help needed moving to and from a bed to chair (including a wheelchair)?: A Little Help needed walking in hospital room?: A Little Help needed climbing 3-5 steps with a railing? : A Lot 6 Click Score: 11    End of Session Equipment Utilized During Treatment: Gait belt Activity Tolerance: Patient tolerated treatment well Patient left: with call bell/phone within reach;in chair;with chair alarm set Nurse Communication: Mobility status PT Visit Diagnosis: Other abnormalities of gait and mobility (R26.89);Muscle weakness (generalized) (M62.81)     Time: 8882-8003 PT Time Calculation (min) (ACUTE ONLY): 24 min  Charges:  $Gait Training: 8-22 mins $Therapeutic Activity: 8-22 mins                     Benjiman Core, [place] Pager [phone] Acute Rehab   Allena Katz 07/01/2018, 3:47 PM

## 2018-07-01 NOTE — Progress Notes (Signed)
PROGRESS NOTE  John Peck [name]:[id number] DOB: 01-08-1924 DOA: 06/29/2018 PCP: Seward Carol, MD  HPI/Recap of past 24 hours: John Peck is a 84 y.o. male with a medical history of CHF, chronic kidney disease, paroxysmal atrial fibrillation not on anticoagulation, hypertension, hyperlipidemia, COPD, who presented to the emergency department with complaints of altered mental status. Information was provided by daughters via phone and I spoke to both daughters in person as well. Patient was seen normal on Sunday.  Monday he noted to have incoherent speech, profanity, and laughing inappropriately. At the nursing facility, [facility], it was thought that he could have a urinary tract infection and he was started on ciprofloxacin on 06/28/2018.  Per daughter, UA was negative. Daughter also reported noting a residual left sided facial droop which was worsening for the past couple of days. Daughter also reported worsening myoclonus over the past few weeks. Neurology was consulted and patient admitted for further management.   Today, pt reported feeling better, denies any new complaints. Noted to have dark urine (hematuria). Leukocytosis noted.  Assessment/Plan: Active Problems:   PAF (paroxysmal atrial fibrillation) (HCC)   CAD in native artery   Hyperlipidemia   Facial droop   Pacemaker   COPD clinical dx/ no pfts on record   CKD (chronic kidney disease), stage III (HCC)   TIA (transient ischemic attack)  Acute metabolic encephalopathy Improving ??TIA Vs AKI/dehydration, r/o source of infection Afebrile, now with leukocytosis Ammonia level WNL, TSH WNL CT head showed no acute findings MRI cannot be done due to pacemaker placement Chest x-ray unremarkable, UA negative for infection, UC no growth ECHO with EF of 40-50%, hypokinesis of inf myocardium Carotid Doppler in April 2019 showed right ICA stenosis 80 to 99%, left ICA stenosis 40 to 59%. Pt unable to follow up with vascular  surgery for possible stent placement Neurology on board, recommend EEG EEG showed normal recording of awake and drowsy states Continue aspirin, Plavix, statin  AKI on CKD stage III Ongoing Repeat UA showed some hematuria, will order renal USS Creatinine on admission 2.2, baseline creatinine 1.3-1.5 Continue gentle IV fluids Daily BMP  Chronic systolic and diastolic heart failure Appears compensated Echocardiogram 03/17/2018 showed EF of 45 to 50%, hypokinesis in the inferior myocardium Continue to Hold Lasix Monitor intake and output, daily weights  CAD/PVD Chest pain free Status post CABG twice in 1989 and catheterization in 2013 Continue aspirin, statin, held Ranexa for now Imdur and Coreg held  Paroxysmal Afib Not on anticoagulation due to history of GI bleed Coreg held due to soft BP  Hypothyroidism Continue Synthroid  Hyperlipidemia LDL at goal Continue statin  Chronic thrombocytopenia Daily CBC   Code Status: DNR  Family Communication: None at bedside  Disposition Plan: Back to SNF   Consultants:  Neurology  Procedures:  None  Antimicrobials:  None  DVT prophylaxis: Held lovenox due to thromocytopenia/hematuria SCDs   Objective: Vitals:   07/01/18 0454 07/01/18 0738 07/01/18 1122 07/01/18 1554  BP: (!) 127/52 (!) 126/52 127/60 (!) 110/43  Pulse: 67 69 65 (!) 59  Resp: 18 18 18 18   Temp: 99.3 F (37.4 C) 99.4 F (37.4 C) 98.2 F (36.8 C) 98 F (36.7 C)  TempSrc: Oral Oral Oral Oral  SpO2: 97% 97% 98% 93%  Weight:      Height:        Intake/Output Summary (Last 24 hours) at 07/01/2018 1613 Last data filed at 07/01/2018 0815 Gross per 24 hour  Intake 240 ml  Output 550 ml  Net -310 ml   Filed Weights   06/29/18 1142 06/29/18 2156  Weight: 71.2 kg 65.3 kg    Exam:   General: NAD  Cardiovascular: S1, S2 present  Respiratory: CTA B  Abdomen: Soft, nontender, nondistended, bowel sounds present  Musculoskeletal: No  pedal edema bilaterally  Skin: Normal  Psychiatry: Normal mood  Neuro: No focal neurologic deficits noted, 5/5 strength present in all 4 extremities   Data Reviewed: CBC: Recent Labs  Lab 06/25/18 2024 06/29/18 1215 06/30/18 0743 07/01/18 0350  WBC 4.7 4.7 4.2 12.9*  NEUTROABS 2.9 2.9 2.3 11.7*  HGB 10.5* 11.6* 10.4* 9.5*  HCT 32.6* 35.3* 31.8* 29.6*  MCV 99.1 95.9 95.5 98.0  PLT 146* 158 129* 854*   Basic Metabolic Panel: Recent Labs  Lab 06/25/18 2024 06/26/18 0039 06/29/18 1215 06/30/18 0743 07/01/18 0350  NA 139  --  141 142 141  K 5.2* 4.4 4.3 3.6 3.4*  CL 104  --  101 108 112*  CO2 26  --  26 25 22   GLUCOSE 97  --  101* 85 113*  BUN 34*  --  38* 28* 35*  CREATININE 1.20  --  2.20* 1.62* 1.64*  CALCIUM 8.4*  --  9.1 8.6* 8.1*   GFR: Estimated Creatinine Clearance: 26.5 mL/min (A) (by C-G formula based on SCr of 1.64 mg/dL (H)). Liver Function Tests: Recent Labs  Lab 06/25/18 2024 06/29/18 1215  AST 19 31  ALT 12 11  ALKPHOS 75 64  BILITOT 1.0 1.1  PROT 6.5 7.6  ALBUMIN 3.2* 3.8   No results for input(s): LIPASE, AMYLASE in the last 168 hours. Recent Labs  Lab 06/30/18 0204  AMMONIA 18   Coagulation Profile: Recent Labs  Lab 06/29/18 1215  INR 1.24   Cardiac Enzymes: No results for input(s): CKTOTAL, CKMB, CKMBINDEX, TROPONINI in the last 168 hours. BNP (last 3 results) No results for input(s): PROBNP in the last 8760 hours. HbA1C: No results for input(s): HGBA1C in the last 72 hours. CBG: Recent Labs  Lab 06/29/18 1158 06/29/18 1350 06/29/18 1542 06/29/18 2102  GLUCAP 89 74 97 86   Lipid Profile: No results for input(s): CHOL, HDL, LDLCALC, TRIG, CHOLHDL, LDLDIRECT in the last 72 hours. Thyroid Function Tests: Recent Labs    06/30/18 0204  TSH 0.614   Anemia Panel: No results for input(s): VITAMINB12, FOLATE, FERRITIN, TIBC, IRON, RETICCTPCT in the last 72 hours. Urine analysis:    Component Value Date/Time    COLORURINE AMBER (A) 07/01/2018 1222   APPEARANCEUR CLOUDY (A) 07/01/2018 1222   LABSPEC 1.017 07/01/2018 1222   PHURINE 8.0 07/01/2018 1222   GLUCOSEU NEGATIVE 07/01/2018 1222   HGBUR MODERATE (A) 07/01/2018 1222   BILIRUBINUR NEGATIVE 07/01/2018 1222   KETONESUR NEGATIVE 07/01/2018 1222   PROTEINUR >=300 (A) 07/01/2018 1222   UROBILINOGEN 0.2 05/09/2015 1523   NITRITE NEGATIVE 07/01/2018 1222   LEUKOCYTESUR MODERATE (A) 07/01/2018 1222   Sepsis Labs: @LABRCNTIP (procalcitonin:4,lacticidven:4)  ) Recent Results (from the past 240 hour(s))  Urine culture     Status: None   Collection Time: 06/29/18 11:55 AM  Result Value Ref Range Status   Specimen Description   Final    URINE, CATHETERIZED Performed at [facility], [place] [address] [place], [place] [address]    Special Requests   Final    NONE Performed at [facility], [place] [address], [place], [place] [address]    Culture   Final  NO GROWTH Performed at [facility], [place] [address], [place], [place] [address]    Report Status 06/30/2018 FINAL  Final      Studies: No results found.  Scheduled Meds: . aspirin  81 mg Oral Daily  . atorvastatin  40 mg Oral Daily  . clopidogrel  75 mg Oral Daily  . enoxaparin (LOVENOX) injection  30 mg Subcutaneous Q24H  . ezetimibe  10 mg Oral Daily  . latanoprost  1 drop Both Eyes Once per day on Mon Thu  . levothyroxine  125 mcg Oral QAC breakfast  . pantoprazole  40 mg Oral Daily  . tamsulosin  0.4 mg Oral Daily    Continuous Infusions: . sodium chloride 50 mL/hr at 07/01/18 1421     LOS: 0 days     Alma Friendly, MD [facility]   If 7PM-7AM, please contact night-coverage [email or web address] Password [facility] 07/01/2018, 4:13 PM

## 2018-07-02 ENCOUNTER — Observation Stay (HOSPITAL_COMMUNITY): Payer: Medicare HMO

## 2018-07-02 DIAGNOSIS — I2510 Atherosclerotic heart disease of native coronary artery without angina pectoris: Secondary | ICD-10-CM | POA: Diagnosis not present

## 2018-07-02 DIAGNOSIS — I639 Cerebral infarction, unspecified: Secondary | ICD-10-CM

## 2018-07-02 DIAGNOSIS — E785 Hyperlipidemia, unspecified: Secondary | ICD-10-CM | POA: Diagnosis not present

## 2018-07-02 DIAGNOSIS — Z950 Presence of cardiac pacemaker: Secondary | ICD-10-CM | POA: Diagnosis not present

## 2018-07-02 DIAGNOSIS — I5042 Chronic combined systolic (congestive) and diastolic (congestive) heart failure: Secondary | ICD-10-CM

## 2018-07-02 DIAGNOSIS — R319 Hematuria, unspecified: Secondary | ICD-10-CM

## 2018-07-02 DIAGNOSIS — N183 Chronic kidney disease, stage 3 (moderate): Secondary | ICD-10-CM | POA: Diagnosis not present

## 2018-07-02 DIAGNOSIS — G459 Transient cerebral ischemic attack, unspecified: Secondary | ICD-10-CM | POA: Diagnosis not present

## 2018-07-02 DIAGNOSIS — I480 Paroxysmal atrial fibrillation: Secondary | ICD-10-CM | POA: Diagnosis not present

## 2018-07-02 DIAGNOSIS — J449 Chronic obstructive pulmonary disease, unspecified: Secondary | ICD-10-CM | POA: Diagnosis not present

## 2018-07-02 DIAGNOSIS — I509 Heart failure, unspecified: Secondary | ICD-10-CM | POA: Diagnosis not present

## 2018-07-02 DIAGNOSIS — R29810 Facial weakness: Secondary | ICD-10-CM | POA: Diagnosis not present

## 2018-07-02 LAB — CBC WITH DIFFERENTIAL/PLATELET
BASOS ABS: 0 10*3/uL (ref 0.0–0.1)
Basophils Relative: 0 %
EOS ABS: 0.2 10*3/uL (ref 0.0–0.7)
Eosinophils Relative: 3 %
HEMATOCRIT: 28.9 % — AB (ref 39.0–52.0)
HEMOGLOBIN: 9.2 g/dL — AB (ref 13.0–17.0)
LYMPHS PCT: 7 %
Lymphs Abs: 0.5 10*3/uL — ABNORMAL LOW (ref 0.7–4.0)
MCH: 31.7 pg (ref 26.0–34.0)
MCHC: 31.8 g/dL (ref 30.0–36.0)
MCV: 99.7 fL (ref 78.0–100.0)
Monocytes Absolute: 0.8 10*3/uL (ref 0.1–1.0)
Monocytes Relative: 12 %
NEUTROS PCT: 78 %
Neutro Abs: 5 10*3/uL (ref 1.7–7.7)
Platelets: 103 10*3/uL — ABNORMAL LOW (ref 150–400)
RBC: 2.9 MIL/uL — ABNORMAL LOW (ref 4.22–5.81)
RDW: 16.3 % — ABNORMAL HIGH (ref 11.5–15.5)
WBC: 6.5 10*3/uL (ref 4.0–10.5)

## 2018-07-02 LAB — BASIC METABOLIC PANEL
Anion gap: 9 (ref 5–15)
BUN: 34 mg/dL — ABNORMAL HIGH (ref 8–23)
CALCIUM: 8.3 mg/dL — AB (ref 8.9–10.3)
CHLORIDE: 113 mmol/L — AB (ref 98–111)
CO2: 21 mmol/L — AB (ref 22–32)
Creatinine, Ser: 1.42 mg/dL — ABNORMAL HIGH (ref 0.61–1.24)
GFR calc non Af Amer: 41 mL/min — ABNORMAL LOW (ref >60)
GFR, EST AFRICAN AMERICAN: 48 mL/min — AB (ref >60)
Glucose, Bld: 101 mg/dL — ABNORMAL HIGH (ref 70–99)
Potassium: 3.6 mmol/L (ref 3.5–5.1)
Sodium: 143 mmol/L (ref 135–145)

## 2018-07-02 LAB — GLUCOSE, CAPILLARY: Glucose-Capillary: 114 mg/dL — ABNORMAL HIGH (ref 70–99)

## 2018-07-02 MED ORDER — FUROSEMIDE 10 MG/ML IJ SOLN: 20.0000 mg | Freq: Once | INTRAMUSCULAR | Status: DC

## 2018-07-02 MED ORDER — RANOLAZINE ER 500 MG PO TB12
1000.0000 mg | ORAL_TABLET | Freq: Two times a day (BID) | ORAL | Status: DC
  Administered 2018-07-02 – 2018-07-06 (×9): 1000 mg via ORAL
  Filled 2018-07-02 (×9): qty 2

## 2018-07-02 MED ORDER — ISOSORBIDE MONONITRATE ER 60 MG PO TB24
90.0000 mg | ORAL_TABLET | Freq: Every day | ORAL | Status: DC
  Administered 2018-07-02 – 2018-07-06 (×5): 90 mg via ORAL
  Filled 2018-07-02 (×6): qty 1

## 2018-07-02 MED ORDER — CARVEDILOL 6.25 MG PO TABS
6.2500 mg | ORAL_TABLET | Freq: Two times a day (BID) | ORAL | Status: DC
  Administered 2018-07-02 – 2018-07-04 (×4): 6.25 mg via ORAL
  Filled 2018-07-02 (×4): qty 1

## 2018-07-02 NOTE — Plan of Care (Signed)
  Problem: Education: Goal: Knowledge of General Education information will improve Description Including pain rating scale, medication(s)/side effects and non-pharmacologic comfort measures Outcome: Progressing   Problem: Health Behavior/Discharge Planning: Goal: Ability to manage health-related needs will improve Outcome: Progressing   Problem: Clinical Measurements: Goal: Ability to maintain clinical measurements within normal limits will improve Outcome: Progressing Goal: Will remain free from infection Outcome: Progressing Goal: Diagnostic test results will improve Outcome: Progressing Goal: Respiratory complications will improve Outcome: Progressing Goal: Cardiovascular complication will be avoided Outcome: Progressing   Problem: Activity: Goal: Risk for activity intolerance will decrease Outcome: Progressing   Problem: Nutrition: Goal: Adequate nutrition will be maintained Outcome: Progressing   Problem: Coping: Goal: Level of anxiety will decrease Outcome: Progressing   Problem: Elimination: Goal: Will not experience complications related to bowel motility Outcome: Progressing Goal: Will not experience complications related to urinary retention Outcome: Progressing   Problem: Pain Managment: Goal: General experience of comfort will improve Outcome: Progressing   Problem: Safety: Goal: Ability to remain free from injury will improve Outcome: Progressing   Problem: Skin Integrity: Goal: Risk for impaired skin integrity will decrease Outcome: Progressing   Problem: Education: Goal: Knowledge of disease or condition will improve Outcome: Progressing Goal: Knowledge of secondary prevention will improve Outcome: Progressing Goal: Knowledge of patient specific risk factors addressed and post discharge goals established will improve Outcome: Progressing   Problem: Coping: Goal: Will verbalize positive feelings about self Outcome: Progressing   Problem:  Health Behavior/Discharge Planning: Goal: Ability to manage health-related needs will improve Outcome: Progressing   Problem: Self-Care: Goal: Ability to participate in self-care as condition permits will improve Outcome: Progressing   Problem: Nutrition: Goal: Risk of aspiration will decrease Outcome: Progressing   Problem: Intracerebral Hemorrhage Tissue Perfusion: Goal: Complications of Intracerebral Hemorrhage will be minimized Outcome: Progressing   Problem: Ischemic Stroke/TIA Tissue Perfusion: Goal: Complications of ischemic stroke/TIA will be minimized Outcome: Progressing   Problem: Spontaneous Subarachnoid Hemorrhage Tissue Perfusion: Goal: Complications of Spontaneous Subarachnoid Hemorrhage will be minimized Outcome: Progressing

## 2018-07-02 NOTE — Progress Notes (Signed)
PROGRESS NOTE  John Peck LKT:[id number] DOB: 05-11-1924 DOA: 06/29/2018 PCP: Seward Carol, MD  HPI/Recap of past 24 hours: John Peck is a 84 y.o. male with a medical history of CHF, chronic kidney disease, paroxysmal atrial fibrillation not on anticoagulation, hypertension, hyperlipidemia, COPD, who presented to the emergency department with complaints of altered mental status. Information was provided by daughters via phone and I spoke to both daughters in person as well. Patient was seen normal on Sunday.  Monday he noted to have incoherent speech, profanity, and laughing inappropriately. At the nursing facility, [facility], it was thought that he could have a urinary tract infection and he was started on ciprofloxacin on 06/28/2018.  Per daughter, UA was negative. Daughter also reported noting a residual left sided facial droop which was worsening for the past couple of days. Daughter also reported worsening myoclonus over the past few weeks. Neurology was consulted and patient admitted for further management.   Today, pt reported feeling better. Upon further questioning, reported burning on urination and chronic bilateral toe pain. Denies any chest pain, SOB, N/V/D, fever/chills  Assessment/Plan: Active Problems:   PAF (paroxysmal atrial fibrillation) (HCC)   CAD in native artery   Hyperlipidemia   Facial droop   Pacemaker   COPD clinical dx/ no pfts on record   CKD (chronic kidney disease), stage III (HCC)   TIA (transient ischemic attack)  Acute metabolic encephalopathy Resolved ??TIA Vs AKI/dehydration, r/o source of infection Afebrile, with resolved leukocytosis Ammonia level WNL, TSH WNL CT head showed no acute findings MRI cannot be done due to pacemaker placement Chest x-ray unremarkable, UA negative for infection, UC no growth, repeat pending due to burning on urination ECHO with EF of 40-50%, hypokinesis of inf myocardium Carotid Doppler in April 2019  showed right ICA stenosis 80 to 99%, left ICA stenosis 40 to 59%. Pt unable to follow up with vascular surgery for possible stent placement Neurology consulted, recommend EEG EEG showed normal recording of awake and drowsy states Continue aspirin, Plavix, statin  AKI on CKD stage III Improved Repeat UA showed some hematuria Renal USS unremarkable Creatinine on admission 2.2, baseline creatinine 1.3-1.5 S/P gentle IV fluids Daily BMP  Chronic systolic and diastolic heart failure Appears compensated Echocardiogram 03/17/2018 showed EF of 45 to 50%, hypokinesis in the inferior myocardium Continue to Hold Lasix Monitor intake and output, daily weights  CAD/Hx of PVD Chest pain free, reports chronic toe pain  Status post CABG twice in 1989 and catheterization in 2013 Continue aspirin, statin, re-started Ranexa, imdur, lower dose of coreg  Paroxysmal Afib Not on anticoagulation due to history of GI bleed Continue lower dose of Coreg  Hypothyroidism Continue Synthroid  Hyperlipidemia LDL at goal Continue statin  Chronic thrombocytopenia Daily CBC   Code Status: DNR  Family Communication: Spoke to daughter over the phone  Disposition Plan: Back to SNF, once insurance authorization is approved   Consultants:  Neurology  Procedures:  None  Antimicrobials:  None  DVT prophylaxis: Held lovenox due to thromocytopenia/hematuria SCDs   Objective: Vitals:   07/02/18 0253 07/02/18 0737 07/02/18 1110 07/02/18 1305  BP: (!) 141/59 (!) 149/62  138/60  Pulse: (!) 55 (!) 59  60  Resp: 19 20  18   Temp: 98.2 F (36.8 C) 97.8 F (36.6 C)  99.2 F (37.3 C)  TempSrc: Oral   Oral  SpO2: 94% 96% 96% 95%  Weight:      Height:  Intake/Output Summary (Last 24 hours) at 07/02/2018 1427 Last data filed at 07/02/2018 0900 Gross per 24 hour  Intake 632.5 ml  Output 300 ml  Net 332.5 ml   Filed Weights   06/29/18 1142 06/29/18 2156  Weight: 71.2 kg 65.3 kg      Exam:   General: NAD  Cardiovascular: S1, S2 present  Respiratory: CTA B  Abdomen: Soft, nontender, nondistended, bowel sounds present  Musculoskeletal: No pedal edema bilaterally  Skin: Normal  Psychiatry: Normal mood  Neuro: No focal neurologic deficits noted, 5/5 strength present in all 4 extremities   Data Reviewed: CBC: Recent Labs  Lab 06/25/18 2024 06/29/18 1215 06/30/18 0743 07/01/18 0350 07/02/18 0410  WBC 4.7 4.7 4.2 12.9* 6.5  NEUTROABS 2.9 2.9 2.3 11.7* 5.0  HGB 10.5* 11.6* 10.4* 9.5* 9.2*  HCT 32.6* 35.3* 31.8* 29.6* 28.9*  MCV 99.1 95.9 95.5 98.0 99.7  PLT 146* 158 129* 117* 182*   Basic Metabolic Panel: Recent Labs  Lab 06/25/18 2024 06/26/18 0039 06/29/18 1215 06/30/18 0743 07/01/18 0350 07/02/18 0410  NA 139  --  141 142 141 143  K 5.2* 4.4 4.3 3.6 3.4* 3.6  CL 104  --  101 108 112* 113*  CO2 26  --  26 25 22  21*  GLUCOSE 97  --  101* 85 113* 101*  BUN 34*  --  38* 28* 35* 34*  CREATININE 1.20  --  2.20* 1.62* 1.64* 1.42*  CALCIUM 8.4*  --  9.1 8.6* 8.1* 8.3*   GFR: Estimated Creatinine Clearance: 30.7 mL/min (A) (by C-G formula based on SCr of 1.42 mg/dL (H)). Liver Function Tests: Recent Labs  Lab 06/25/18 2024 06/29/18 1215  AST 19 31  ALT 12 11  ALKPHOS 75 64  BILITOT 1.0 1.1  PROT 6.5 7.6  ALBUMIN 3.2* 3.8   No results for input(s): LIPASE, AMYLASE in the last 168 hours. Recent Labs  Lab 06/30/18 0204  AMMONIA 18   Coagulation Profile: Recent Labs  Lab 06/29/18 1215  INR 1.24   Cardiac Enzymes: No results for input(s): CKTOTAL, CKMB, CKMBINDEX, TROPONINI in the last 168 hours. BNP (last 3 results) No results for input(s): PROBNP in the last 8760 hours. HbA1C: No results for input(s): HGBA1C in the last 72 hours. CBG: Recent Labs  Lab 06/29/18 1158 06/29/18 1350 06/29/18 1542 06/29/18 2102  GLUCAP 89 74 97 86   Lipid Profile: No results for input(s): CHOL, HDL, LDLCALC, TRIG, CHOLHDL, LDLDIRECT  in the last 72 hours. Thyroid Function Tests: Recent Labs    06/30/18 0204  TSH 0.614   Anemia Panel: No results for input(s): VITAMINB12, FOLATE, FERRITIN, TIBC, IRON, RETICCTPCT in the last 72 hours. Urine analysis:    Component Value Date/Time   COLORURINE AMBER (A) 07/01/2018 1222   APPEARANCEUR CLOUDY (A) 07/01/2018 1222   LABSPEC 1.017 07/01/2018 1222   PHURINE 8.0 07/01/2018 1222   GLUCOSEU NEGATIVE 07/01/2018 1222   HGBUR MODERATE (A) 07/01/2018 1222   BILIRUBINUR NEGATIVE 07/01/2018 1222   KETONESUR NEGATIVE 07/01/2018 1222   PROTEINUR >=300 (A) 07/01/2018 1222   UROBILINOGEN 0.2 05/09/2015 1523   NITRITE NEGATIVE 07/01/2018 1222   LEUKOCYTESUR MODERATE (A) 07/01/2018 1222   Sepsis Labs: @LABRCNTIP (procalcitonin:4,lacticidven:4)  ) Recent Results (from the past 240 hour(s))  Urine culture     Status: None   Collection Time: 06/29/18 11:55 AM  Result Value Ref Range Status   Specimen Description   Final    URINE, CATHETERIZED Performed at [facility], [place] [address], [place], [place] [address]    Special Requests   Final    NONE Performed at [facility], [place] [address] [place], [place] [address]    Culture   Final    NO GROWTH Performed at [facility], [place] [address] [place], [place] [address]    Report Status 06/30/2018 FINAL  Final      Studies: US Renal  Result Date: 07/01/2018 CLINICAL DATA:  Hematuria EXAM: RENAL / URINARY TRACT ULTRASOUND COMPLETE COMPARISON:  Limited CT images through the upper abdomen from 07/17/2006 FINDINGS: Right Kidney: Length: 8.7 cm. Echogenicity within normal limits. No hydronephrosis. A 1.8 by 1.6 by 1.8 cm hypoechoic lesion of the right kidney lower pole has accentuated through transmission characteristic of a cyst, but may have some faint internal echoes. Left Kidney: Length: 8.4 cm. Echogenicity within normal limits. No mass or hydronephrosis visualized. Bladder: Appears  normal for degree of bladder distention. IMPRESSION: 1. Normal renal echogenicity. No hydronephrosis or definite renal calculi observed. The cause of the patient's hematuria is not identified. 2. 1.8 cm hypoechoic lesion of the right kidney lower pole has accentuated through transmission typical for a cyst. Electronically Signed   By: Van Clines M.D.   On: 07/01/2018 20:28   Dg Chest Port 1 View  Result Date: 07/02/2018 CLINICAL DATA:  CHF EXAM: PORTABLE CHEST 1 VIEW COMPARISON:  06/29/2018 chest radiograph. FINDINGS: Intact sternotomy wires. Stable 2 lead left subclavian pacemaker. Stable cardiomediastinal silhouette with borderline mild cardiomegaly. No pneumothorax. Small bilateral pleural effusions, stable. No overt pulmonary edema. Stable bibasilar scarring versus atelectasis. IMPRESSION: Stable chest radiograph with borderline mild cardiomegaly, no overt pulmonary edema and small bilateral pleural effusions with mild bibasilar scarring versus atelectasis. Electronically Signed   By: Ilona Sorrel M.D.   On: 07/02/2018 09:15    Scheduled Meds: . aspirin  81 mg Oral Daily  . atorvastatin  40 mg Oral Daily  . clopidogrel  75 mg Oral Daily  . ezetimibe  10 mg Oral Daily  . latanoprost  1 drop Both Eyes Once per day on Mon Thu  . levothyroxine  125 mcg Oral QAC breakfast  . pantoprazole  40 mg Oral Daily  . ranolazine  1,000 mg Oral BID  . tamsulosin  0.4 mg Oral Daily    Continuous Infusions:    LOS: 0 days     Alma Friendly, MD [facility]   If 7PM-7AM, please contact night-coverage [email or web address] Password [facility] 07/02/2018, 2:27 PM

## 2018-07-03 DIAGNOSIS — N183 Chronic kidney disease, stage 3 (moderate): Secondary | ICD-10-CM | POA: Diagnosis not present

## 2018-07-03 DIAGNOSIS — I5042 Chronic combined systolic (congestive) and diastolic (congestive) heart failure: Secondary | ICD-10-CM | POA: Diagnosis not present

## 2018-07-03 DIAGNOSIS — E785 Hyperlipidemia, unspecified: Secondary | ICD-10-CM | POA: Diagnosis not present

## 2018-07-03 DIAGNOSIS — I2510 Atherosclerotic heart disease of native coronary artery without angina pectoris: Secondary | ICD-10-CM | POA: Diagnosis not present

## 2018-07-03 DIAGNOSIS — Z950 Presence of cardiac pacemaker: Secondary | ICD-10-CM | POA: Diagnosis not present

## 2018-07-03 DIAGNOSIS — R29810 Facial weakness: Secondary | ICD-10-CM | POA: Diagnosis not present

## 2018-07-03 DIAGNOSIS — J449 Chronic obstructive pulmonary disease, unspecified: Secondary | ICD-10-CM | POA: Diagnosis not present

## 2018-07-03 DIAGNOSIS — N390 Urinary tract infection, site not specified: Secondary | ICD-10-CM

## 2018-07-03 DIAGNOSIS — I639 Cerebral infarction, unspecified: Secondary | ICD-10-CM | POA: Diagnosis not present

## 2018-07-03 DIAGNOSIS — I480 Paroxysmal atrial fibrillation: Secondary | ICD-10-CM | POA: Diagnosis not present

## 2018-07-03 DIAGNOSIS — G459 Transient cerebral ischemic attack, unspecified: Secondary | ICD-10-CM | POA: Diagnosis not present

## 2018-07-03 LAB — CBC WITH DIFFERENTIAL/PLATELET
ABS IMMATURE GRANULOCYTES: 0 10*3/uL (ref 0.0–0.1)
BASOS ABS: 0 10*3/uL (ref 0.0–0.1)
Basophils Relative: 0 %
EOS PCT: 7 %
Eosinophils Absolute: 0.3 10*3/uL (ref 0.0–0.7)
HEMATOCRIT: 26.1 % — AB (ref 39.0–52.0)
Hemoglobin: 8.6 g/dL — ABNORMAL LOW (ref 13.0–17.0)
Immature Granulocytes: 0 %
LYMPHS PCT: 14 %
Lymphs Abs: 0.6 10*3/uL — ABNORMAL LOW (ref 0.7–4.0)
MCH: 32.3 pg (ref 26.0–34.0)
MCHC: 33 g/dL (ref 30.0–36.0)
MCV: 98.1 fL (ref 78.0–100.0)
MONO ABS: 0.9 10*3/uL (ref 0.1–1.0)
MONOS PCT: 20 %
NEUTROS ABS: 2.7 10*3/uL (ref 1.7–7.7)
Neutrophils Relative %: 59 %
Platelets: 107 10*3/uL — ABNORMAL LOW (ref 150–400)
RBC: 2.66 MIL/uL — ABNORMAL LOW (ref 4.22–5.81)
RDW: 16.2 % — ABNORMAL HIGH (ref 11.5–15.5)
WBC: 4.6 10*3/uL (ref 4.0–10.5)

## 2018-07-03 LAB — BASIC METABOLIC PANEL
Anion gap: 6 (ref 5–15)
BUN: 27 mg/dL — AB (ref 8–23)
CO2: 23 mmol/L (ref 22–32)
Calcium: 8.1 mg/dL — ABNORMAL LOW (ref 8.9–10.3)
Chloride: 115 mmol/L — ABNORMAL HIGH (ref 98–111)
Creatinine, Ser: 1.48 mg/dL — ABNORMAL HIGH (ref 0.61–1.24)
GFR calc non Af Amer: 39 mL/min — ABNORMAL LOW (ref >60)
GFR, EST AFRICAN AMERICAN: 46 mL/min — AB (ref >60)
Glucose, Bld: 97 mg/dL (ref 70–99)
POTASSIUM: 4 mmol/L (ref 3.5–5.1)
SODIUM: 144 mmol/L (ref 135–145)

## 2018-07-03 MED ORDER — SODIUM CHLORIDE 0.9 % IV SOLN
1.0000 g | INTRAVENOUS | Status: DC
  Administered 2018-07-03 – 2018-07-05 (×3): 1 g via INTRAVENOUS
  Filled 2018-07-03 (×3): qty 10

## 2018-07-03 NOTE — Plan of Care (Signed)
  Problem: Education: Goal: Knowledge of General Education information will improve Description Including pain rating scale, medication(s)/side effects and non-pharmacologic comfort measures Outcome: Progressing   Problem: Health Behavior/Discharge Planning: Goal: Ability to manage health-related needs will improve Outcome: Progressing   Problem: Clinical Measurements: Goal: Ability to maintain clinical measurements within normal limits will improve Outcome: Progressing Goal: Will remain free from infection Outcome: Progressing Goal: Diagnostic test results will improve Outcome: Progressing Goal: Respiratory complications will improve Outcome: Progressing Goal: Cardiovascular complication will be avoided Outcome: Progressing   Problem: Activity: Goal: Risk for activity intolerance will decrease Outcome: Progressing   Problem: Nutrition: Goal: Adequate nutrition will be maintained Outcome: Progressing   Problem: Coping: Goal: Level of anxiety will decrease Outcome: Progressing   Problem: Elimination: Goal: Will not experience complications related to bowel motility Outcome: Progressing Goal: Will not experience complications related to urinary retention Outcome: Progressing   Problem: Pain Managment: Goal: General experience of comfort will improve Outcome: Progressing   Problem: Safety: Goal: Ability to remain free from injury will improve Outcome: Progressing   Problem: Skin Integrity: Goal: Risk for impaired skin integrity will decrease Outcome: Progressing   Problem: Education: Goal: Knowledge of disease or condition will improve Outcome: Progressing Goal: Knowledge of secondary prevention will improve Outcome: Progressing Goal: Knowledge of patient specific risk factors addressed and post discharge goals established will improve Outcome: Progressing   Problem: Coping: Goal: Will verbalize positive feelings about self Outcome: Progressing   Problem:  Health Behavior/Discharge Planning: Goal: Ability to manage health-related needs will improve Outcome: Progressing   Problem: Self-Care: Goal: Ability to participate in self-care as condition permits will improve Outcome: Progressing   Problem: Nutrition: Goal: Risk of aspiration will decrease Outcome: Progressing   Problem: Intracerebral Hemorrhage Tissue Perfusion: Goal: Complications of Intracerebral Hemorrhage will be minimized Outcome: Progressing   Problem: Ischemic Stroke/TIA Tissue Perfusion: Goal: Complications of ischemic stroke/TIA will be minimized Outcome: Progressing   Problem: Spontaneous Subarachnoid Hemorrhage Tissue Perfusion: Goal: Complications of Spontaneous Subarachnoid Hemorrhage will be minimized Outcome: Progressing

## 2018-07-03 NOTE — Consult Note (Signed)
[place] Nurse wound consult note Reason for Consult:Patient with neuropathic pain at heels and toes. No wounds present. Wound type: Neuropathic Pressure Injury POA: N/A Measurement:N/A Wound bed:N/A Drainage (amount, consistency, odor) N/A Periwound:N/A Dressing procedure/placement/frequency: Patient's feet assessed for the presence of wounds, no wounds found. Patient with complaints of pain during assessment if this writer's hands rested on heels or brushed against toes. Patient's daughter present for exam. We are using pressure redistribution heel boots to protect from injury and to float heels.   No suggestions for care for suspected neuropathic pain. I defer to medical expertise on this.  [facility] nursing team will not follow, but will remain available to this patient, the nursing and medical teams.  Please re-consult if needed. Thanks, Maudie Flakes, MSN, RN, [place], Ar[facility] Abbott  Pager# [phone]

## 2018-07-03 NOTE — Evaluation (Signed)
Speech Language Pathology Evaluation Patient Details Name: John Peck MRN: [id number] DOB: [birth year] Today's Date: 07/03/2018 Time: 3419-3790 SLP Time Calculation (min) (ACUTE ONLY): 14 min  Problem List:  Patient Active Problem List   Diagnosis Date Noted  . TIA (transient ischemic attack) 06/29/2018  . Pressure injury of skin 05/25/2018  . Pleural effusion 05/24/2018  . Acute respiratory failure with hypoxia ([place]) 05/24/2018  . Acute on chronic combined systolic and diastolic CHF (congestive heart failure) ([place]) 03/14/2018  . COPD clinical dx/ no pfts on record 03/14/2018  . H/O: GI bleed 03/14/2018  . CKD (chronic kidney disease), stage III ([place]) 03/14/2018  . Paroxysmal atrial fibrillation ([place]) 03/14/2018  . Hypothyroidism 03/14/2018  . Carotid stenosis, right 03/14/2018  . Facial droop 02/22/2018  . Pancytopenia ([place]) 02/22/2018  . Generalized weakness   . Hypertensive heart disease   . Hyperlipidemia   . Stented coronary artery   . Coronary artery disease involving native coronary artery of native heart with unstable angina pectoris ([place])   . Reactive airway disease 11/27/2015  . CAD in native artery 09/03/2015  . Weakness 05/09/2015  . Hyperlipidemia LDL goal <70 02/28/2015  . Atherosclerosis of lower extremity with claudication ([place]) 07/17/2014  . PAF (paroxysmal atrial fibrillation) ([place]) 03/24/2013  . Lower extremity edema 03/24/2013  . Sinoatrial node dysfunction ([place]) 08/17/2012  . History of iron deficiency anemia 09/18/2011  . Essential hypertension 07/07/2009  . INTERMEDIATE CORONARY SYNDROME 07/07/2009  . S/P CABG x 2 '89. RCA stent'04. last cath Jan 2013 07/07/2009  . PACEMAKER, PERMANENT- St Jude Sept 2009 07/07/2009  . Sick sinus syndrome ([place]) 07/10/2008  . Pacemaker 07/10/2008   Past Medical History:  Past Medical History:  Diagnosis Date  . Anemia   . Arthritis   . CAD (coronary artery disease)    a. CABG '89; b. PCI '04; c. 11/2015  Cath/PCI: LM 20ost, LAD 152m, LCX 30p, OM2 40, RCA 30p, 10p ISR, 90d (3.0x12 Synergy DES), AM 90, VG->OM2 known to be 100, LIMA->LAD not injected, patent in 2015.  . Carotid stenosis, right   . Chronic combined systolic and diastolic CHF (congestive heart failure) ([place])   . CKD (chronic kidney disease), stage III ([place])   . H/O: GI bleed    REMOTE HISTORY  . History of COPD   . Hyperlipidemia   . Hypertensive heart disease   . LBBB (left bundle branch block)   . Pacemaker Sept 2009   St Jude  . PAF (paroxysmal atrial fibrillation) (H[facility])    a. not on anticoag due to prior GIB.  . Sick sinus syndrome [facility]) Sept 2009   ST Jude PTVDP  . Stroke [facility])    Past Surgical History:  Past Surgical History:  Procedure Laterality Date  . CARDIAC CATHETERIZATION  11/2011   EF 50%; significant native CAD w/80% stenosis of the LAD after 2nd giagonal vessel and septal perforating artery w/total occlusion of the mid left anterior descendiung.; 60-70% ostiasl stenosis in the circumflex vessel followed by 40% proximal stenosis and 70-80% distal circumflex stenosis;   . CARDIAC CATHETERIZATION N/A 11/29/2015   Procedure: Left Heart Cath and Cors/Grafts Angiography;  Surgeon: Burnell Blanks, MD;  Location: [place] CV LAB;  Service: Cardiovascular;  Laterality: N/A;  . CARDIAC CATHETERIZATION N/A 11/29/2015   Procedure: Coronary Stent Intervention;  Surgeon: Burnell Blanks, MD;  Location: [place] CV LAB;  Service: Cardiovascular;  Laterality: N/A;  . CATARACT EXTRACTION  2004  . CORONARY ANGIOGRAPHY N/A 02/25/2017  Procedure: Coronary Angiography;  Surgeon: Belva Crome, MD;  Location: [place] CV LAB;  Service: Cardiovascular;  Laterality: N/A;  . CORONARY ANGIOPLASTY WITH STENT PLACEMENT  2004   RCA  . CORONARY ARTERY BYPASS GRAFT  1989   had LIMA to his LAD, a vein to the circumflex. In 2004 underwent stenting to his right coronary artery.  . CORONARY STENT INTERVENTION N/A  02/24/2017   Procedure: Coronary Stent Intervention;  Surgeon: Wellington Hampshire, MD;  Location: [place] CV LAB;  Service: Cardiovascular;  Laterality: N/A;  cfx  . HEMORRHOID SURGERY    . INSERT / REPLACE / REMOVE PACEMAKER  07/17/08   DUAL-CHAMBER; PPM-ST.JUDE MEDNET  . IR THORACENTESIS ASP PLEURAL SPACE W/IMG GUIDE  05/25/2018  . LEFT HEART CATH AND CORS/GRAFTS ANGIOGRAPHY N/A 02/24/2017   Procedure: Left Heart Cath and Cors/Grafts Angiography;  Surgeon: Wellington Hampshire, MD;  Location: [place] CV LAB;  Service: Cardiovascular;  Laterality: N/A;  . LEFT HEART CATHETERIZATION WITH CORONARY/GRAFT ANGIOGRAM N/A 12/10/2011   Procedure: LEFT HEART CATHETERIZATION WITH Beatrix Fetters;  Surgeon: Troy Sine, MD;  Location: [facility] CATH LAB;  Service: Cardiovascular;  Laterality: N/A;  . LEFT HEART CATHETERIZATION WITH CORONARY/GRAFT ANGIOGRAM N/A 01/26/2014   Procedure: LEFT HEART CATHETERIZATION WITH Beatrix Fetters;  Surgeon: Troy Sine, MD;  [facility] CATH LAB;  Service: Cardiovascular;  Laterality: N/A;  . PPM GENERATOR CHANGEOUT N/A 12/15/2017   Procedure: PPM GENERATOR CHANGEOUT;  Surgeon: Evans Lance, MD;  Location: [place] CV LAB;  Service: Cardiovascular;  Laterality: N/A;  . PROSTATECTOMY     HPI:  84 y.o. male with a history of hypertension, hyperlipidemia, stroke, A. Fib(not on anticoagulation due to GI bleed) who presents with progressive confusion and myoclonus.   Assessment / Plan / Recommendation Clinical Impression  Pt is alert, cordial, with appropriate speech and language.  Follows commands, engages in social communication and makes needs/ideas known.  He is not oriented to situation or diagnoses for which he is hospitalized; deficits in short-term recall and awareness are present, but there are no indications for acute SLP intervention, and his current level of cognition is functional for the limited demands of his environment.  Our services will  sign off.     SLP Assessment  SLP Recommendation/Assessment: Patient does not need any further Speech Lanaguage Pathology Services    Follow Up Recommendations  None    Frequency and Duration           SLP Evaluation Cognition  Overall Cognitive Status: No family/caregiver present to determine baseline cognitive functioning Arousal/Alertness: Awake/alert Orientation Level: Disoriented to situation Attention: Sustained Sustained Attention: Appears intact Awareness: Impaired Awareness Impairment: Emergent impairment       Comprehension  Auditory Comprehension Overall Auditory Comprehension: Appears within functional limits for tasks assessed Visual Recognition/Discrimination Discrimination: Within Function Limits Reading Comprehension Reading Status: Not tested    Expression Expression Primary Mode of Expression: Verbal   Oral / Motor  Oral Motor/Sensory Function Overall Oral Motor/Sensory Function: Within functional limits Motor Speech Overall Motor Speech: Appears within functional limits for tasks assessed   GO                    Juan Quam Laurice 07/03/2018, 12:02 PM

## 2018-07-03 NOTE — Progress Notes (Signed)
PROGRESS NOTE  John Peck LOV:[id number] DOB: 1924/10/31 DOA: 06/29/2018 PCP: Seward Carol, MD  HPI/Recap of past 24 hours: John Peck is a 84 y.o. male with a medical history of CHF, chronic kidney disease, paroxysmal atrial fibrillation not on anticoagulation, hypertension, hyperlipidemia, COPD, who presented to the emergency department with complaints of altered mental status. Information was provided by daughters via phone and I spoke to both daughters in person as well. Patient was seen normal on Sunday.  Monday he noted to have incoherent speech, profanity, and laughing inappropriately. At the nursing facility, [facility], it was thought that he could have a urinary tract infection and he was started on ciprofloxacin on 06/28/2018.  Per daughter, UA was negative. Daughter also reported noting a residual left sided facial droop which was worsening for the past couple of days. Daughter also reported worsening myoclonus over the past few weeks. Neurology was consulted and patient admitted for further management.   Today, pt still reported dysuria and chronic bilateral ankle/foot pain. Denies any chest pain, SOB, N/V/D, fever/chills  Assessment/Plan: Active Problems:   PAF (paroxysmal atrial fibrillation) (HCC)   CAD in native artery   Hyperlipidemia   Facial droop   Pacemaker   COPD clinical dx/ no pfts on record   CKD (chronic kidney disease), stage III (HCC)   TIA (transient ischemic attack)  Acute metabolic encephalopathy Resolved ??TIA Vs AKI/dehydration, r/o source of infection Afebrile, with resolved leukocytosis Ammonia level WNL, TSH WNL CT head showed no acute findings MRI cannot be done due to pacemaker placement Chest x-ray unremarkable, UA negative for infection, UC no growth ECHO with EF of 40-50%, hypokinesis of inf myocardium Carotid Doppler in April 2019 showed right ICA stenosis 80 to 99%, left ICA stenosis 40 to 59%. Pt unable to follow up with  vascular surgery for possible stent placement Neurology consulted, recommend EEG EEG showed normal recording of awake and drowsy states Continue aspirin, Plavix, statin  UTI Reported dysuria Initial UC showed no growth, repeat UC showed 80,000 Proteus mirabilis Started on IV Rocephin   AKI on CKD stage III Improved Repeat UA showed some hematuria Renal USS unremarkable Creatinine on admission 2.2, baseline creatinine 1.3-1.5 S/P gentle IV fluids Daily BMP  Anemia of chronic kidney disease Stable, baseline hgb 9-10 Check iron panel Daily CBC  Chronic systolic and diastolic heart failure Appears compensated Echocardiogram 03/17/2018 showed EF of 45 to 50%, hypokinesis in the inferior myocardium Continue to Hold Lasix Monitor intake and output, daily weights  CAD/Hx of PVD Chest pain free, reports chronic toe pain  Status post CABG twice in 1989 and catheterization in 2013 Continue aspirin, statin, re-started Ranexa, imdur, lower dose of coreg  Paroxysmal Afib Not on anticoagulation due to history of GI bleed Continue lower dose of Coreg  Hypothyroidism Continue Synthroid  Hyperlipidemia LDL at goal Continue statin  Chronic thrombocytopenia Daily CBC   Code Status: DNR  Family Communication: None at bedside  Disposition Plan: Back to SNF, once insurance authorization is approved and work up complete   Consultants:  Neurology  Procedures:  None  Antimicrobials:  IV Ceftriaxone  DVT prophylaxis: Held lovenox due to thromocytopenia/hematuria SCDs   Objective: Vitals:   07/03/18 0447 07/03/18 0745 07/03/18 1223 07/03/18 1400  BP: (!) 142/61 (!) 156/70 139/70   Pulse: 64 63 63   Resp: 16 18 18    Temp: 98 F (36.7 C) 98 F (36.7 C) 98.2 F (36.8 C)   TempSrc: Oral Oral Oral  SpO2: 100% 96% 97% 95%  Weight:      Height:        Intake/Output Summary (Last 24 hours) at 07/03/2018 1403 Last data filed at 07/03/2018 1100 Gross per 24  hour  Intake 240 ml  Output 300 ml  Net -60 ml   Filed Weights   06/29/18 1142 06/29/18 2156  Weight: 71.2 kg 65.3 kg    Exam:   General: NAD  Cardiovascular: S1, S2 present  Respiratory: CTA B  Abdomen: Soft, nontender, nondistended, bowel sounds present  Musculoskeletal: No pedal edema bilaterally  Skin: Normal  Psychiatry: Normal mood  Neuro: No focal neurologic deficits noted, 5/5 strength present in all 4 extremities   Data Reviewed: CBC: Recent Labs  Lab 06/29/18 1215 06/30/18 0743 07/01/18 0350 07/02/18 0410 07/03/18 0630  WBC 4.7 4.2 12.9* 6.5 4.6  NEUTROABS 2.9 2.3 11.7* 5.0 2.7  HGB 11.6* 10.4* 9.5* 9.2* 8.6*  HCT 35.3* 31.8* 29.6* 28.9* 26.1*  MCV 95.9 95.5 98.0 99.7 98.1  PLT 158 129* 117* 103* 413*   Basic Metabolic Panel: Recent Labs  Lab 06/29/18 1215 06/30/18 0743 07/01/18 0350 07/02/18 0410 07/03/18 0630  NA 141 142 141 143 144  K 4.3 3.6 3.4* 3.6 4.0  CL 101 108 112* 113* 115*  CO2 26 25 22  21* 23  GLUCOSE 101* 85 113* 101* 97  BUN 38* 28* 35* 34* 27*  CREATININE 2.20* 1.62* 1.64* 1.42* 1.48*  CALCIUM 9.1 8.6* 8.1* 8.3* 8.1*   GFR: Estimated Creatinine Clearance: 29.4 mL/min (A) (by C-G formula based on SCr of 1.48 mg/dL (H)). Liver Function Tests: Recent Labs  Lab 06/29/18 1215  AST 31  ALT 11  ALKPHOS 64  BILITOT 1.1  PROT 7.6  ALBUMIN 3.8   No results for input(s): LIPASE, AMYLASE in the last 168 hours. Recent Labs  Lab 06/30/18 0204  AMMONIA 18   Coagulation Profile: Recent Labs  Lab 06/29/18 1215  INR 1.24   Cardiac Enzymes: No results for input(s): CKTOTAL, CKMB, CKMBINDEX, TROPONINI in the last 168 hours. BNP (last 3 results) No results for input(s): PROBNP in the last 8760 hours. HbA1C: No results for input(s): HGBA1C in the last 72 hours. CBG: Recent Labs  Lab 06/29/18 1158 06/29/18 1350 06/29/18 1542 06/29/18 2102 07/02/18 2031  GLUCAP 89 74 97 86 114*   Lipid Profile: No results for  input(s): CHOL, HDL, LDLCALC, TRIG, CHOLHDL, LDLDIRECT in the last 72 hours. Thyroid Function Tests: No results for input(s): TSH, T4TOTAL, FREET4, T3FREE, THYROIDAB in the last 72 hours. Anemia Panel: No results for input(s): VITAMINB12, FOLATE, FERRITIN, TIBC, IRON, RETICCTPCT in the last 72 hours. Urine analysis:    Component Value Date/Time   COLORURINE AMBER (A) 07/01/2018 1222   APPEARANCEUR CLOUDY (A) 07/01/2018 1222   LABSPEC 1.017 07/01/2018 1222   PHURINE 8.0 07/01/2018 1222   GLUCOSEU NEGATIVE 07/01/2018 1222   HGBUR MODERATE (A) 07/01/2018 1222   BILIRUBINUR NEGATIVE 07/01/2018 1222   KETONESUR NEGATIVE 07/01/2018 1222   PROTEINUR >=300 (A) 07/01/2018 1222   UROBILINOGEN 0.2 05/09/2015 1523   NITRITE NEGATIVE 07/01/2018 1222   LEUKOCYTESUR MODERATE (A) 07/01/2018 1222   Sepsis Labs: @LABRCNTIP (procalcitonin:4,lacticidven:4)  ) Recent Results (from the past 240 hour(s))  Urine culture     Status: None   Collection Time: 06/29/18 11:55 AM  Result Value Ref Range Status   Specimen Description   Final    URINE, CATHETERIZED Performed at [facility], [place] [address] [place], [place] [address]  Special Requests   Final    NONE Performed at [facility], [place] [address], [place], [place] [address]    Culture   Final    NO GROWTH Performed at [facility], [place] [address], [place], [place] [address]    Report Status 06/30/2018 FINAL  Final  Urine Culture     Status: Abnormal (Preliminary result)   Collection Time: 07/02/18  1:10 PM  Result Value Ref Range Status   Specimen Description URINE, RANDOM  Final   Special Requests   Final    NONE Performed at [facility], [place] [address] [place], [place] [address]    Culture 80,000 COLONIES/mL PROTEUS MIRABILIS (A)  Final   Report Status PENDING  Incomplete      Studies: No results found.  Scheduled Meds: . aspirin  81 mg Oral Daily  . atorvastatin  40 mg  Oral Daily  . carvedilol  6.25 mg Oral BID WC  . clopidogrel  75 mg Oral Daily  . ezetimibe  10 mg Oral Daily  . isosorbide mononitrate  90 mg Oral Daily  . latanoprost  1 drop Both Eyes Once per day on Mon Thu  . levothyroxine  125 mcg Oral QAC breakfast  . pantoprazole  40 mg Oral Daily  . ranolazine  1,000 mg Oral BID  . tamsulosin  0.4 mg Oral Daily    Continuous Infusions: . cefTRIAXone (ROCEPHIN)  IV       LOS: 0 days     Alma Friendly, MD [facility]   If 7PM-7AM, please contact night-coverage [email or web address] Password [facility] 07/03/2018, 2:03 PM

## 2018-07-04 DIAGNOSIS — E785 Hyperlipidemia, unspecified: Secondary | ICD-10-CM | POA: Diagnosis not present

## 2018-07-04 DIAGNOSIS — I480 Paroxysmal atrial fibrillation: Secondary | ICD-10-CM | POA: Diagnosis not present

## 2018-07-04 DIAGNOSIS — N183 Chronic kidney disease, stage 3 (moderate): Secondary | ICD-10-CM | POA: Diagnosis not present

## 2018-07-04 DIAGNOSIS — I639 Cerebral infarction, unspecified: Secondary | ICD-10-CM | POA: Diagnosis not present

## 2018-07-04 DIAGNOSIS — J449 Chronic obstructive pulmonary disease, unspecified: Secondary | ICD-10-CM | POA: Diagnosis not present

## 2018-07-04 DIAGNOSIS — G459 Transient cerebral ischemic attack, unspecified: Secondary | ICD-10-CM | POA: Diagnosis not present

## 2018-07-04 DIAGNOSIS — R29810 Facial weakness: Secondary | ICD-10-CM | POA: Diagnosis not present

## 2018-07-04 DIAGNOSIS — I2510 Atherosclerotic heart disease of native coronary artery without angina pectoris: Secondary | ICD-10-CM | POA: Diagnosis not present

## 2018-07-04 DIAGNOSIS — I5042 Chronic combined systolic (congestive) and diastolic (congestive) heart failure: Secondary | ICD-10-CM | POA: Diagnosis not present

## 2018-07-04 DIAGNOSIS — Z950 Presence of cardiac pacemaker: Secondary | ICD-10-CM | POA: Diagnosis not present

## 2018-07-04 LAB — CBC WITH DIFFERENTIAL/PLATELET
Abs Immature Granulocytes: 0 10*3/uL (ref 0.0–0.1)
BASOS ABS: 0 10*3/uL (ref 0.0–0.1)
BASOS PCT: 0 %
EOS PCT: 8 %
Eosinophils Absolute: 0.4 10*3/uL (ref 0.0–0.7)
HCT: 29.2 % — ABNORMAL LOW (ref 39.0–52.0)
HEMOGLOBIN: 9.5 g/dL — AB (ref 13.0–17.0)
Immature Granulocytes: 0 %
Lymphocytes Relative: 17 %
Lymphs Abs: 0.8 10*3/uL (ref 0.7–4.0)
MCH: 31.7 pg (ref 26.0–34.0)
MCHC: 32.5 g/dL (ref 30.0–36.0)
MCV: 97.3 fL (ref 78.0–100.0)
MONO ABS: 0.8 10*3/uL (ref 0.1–1.0)
MONOS PCT: 18 %
Neutro Abs: 2.6 10*3/uL (ref 1.7–7.7)
Neutrophils Relative %: 57 %
PLATELETS: 121 10*3/uL — AB (ref 150–400)
RBC: 3 MIL/uL — ABNORMAL LOW (ref 4.22–5.81)
RDW: 16.1 % — ABNORMAL HIGH (ref 11.5–15.5)
WBC: 4.6 10*3/uL (ref 4.0–10.5)

## 2018-07-04 LAB — IRON AND TIBC
IRON: 55 ug/dL (ref 45–182)
Saturation Ratios: 36 % (ref 17.9–39.5)
TIBC: 151 ug/dL — ABNORMAL LOW (ref 250–450)
UIBC: 96 ug/dL

## 2018-07-04 LAB — URINE CULTURE: Culture: 80000 — AB

## 2018-07-04 LAB — BASIC METABOLIC PANEL
Anion gap: 7 (ref 5–15)
BUN: 24 mg/dL — AB (ref 8–23)
CHLORIDE: 112 mmol/L — AB (ref 98–111)
CO2: 24 mmol/L (ref 22–32)
Calcium: 8.2 mg/dL — ABNORMAL LOW (ref 8.9–10.3)
Creatinine, Ser: 1.21 mg/dL (ref 0.61–1.24)
GFR calc Af Amer: 58 mL/min — ABNORMAL LOW (ref >60)
GFR calc non Af Amer: 50 mL/min — ABNORMAL LOW (ref >60)
GLUCOSE: 113 mg/dL — AB (ref 70–99)
POTASSIUM: 3.9 mmol/L (ref 3.5–5.1)
SODIUM: 143 mmol/L (ref 135–145)

## 2018-07-04 LAB — FERRITIN: FERRITIN: 473 ng/mL — AB (ref 24–336)

## 2018-07-04 MED ORDER — POLYETHYLENE GLYCOL 3350 17 G PO PACK
17.0000 g | PACK | Freq: Every day | ORAL | Status: DC
  Administered 2018-07-04 – 2018-07-06 (×3): 17 g via ORAL
  Filled 2018-07-04 (×3): qty 1

## 2018-07-04 MED ORDER — NITROGLYCERIN 0.4 MG SL SUBL
SUBLINGUAL_TABLET | SUBLINGUAL | Status: AC
  Administered 2018-07-04: 0.4 mg
  Filled 2018-07-04: qty 1

## 2018-07-04 MED ORDER — SENNA 8.6 MG PO TABS
2.0000 | ORAL_TABLET | Freq: Two times a day (BID) | ORAL | Status: DC
  Administered 2018-07-04 – 2018-07-06 (×5): 17.2 mg via ORAL
  Filled 2018-07-04 (×6): qty 2

## 2018-07-04 MED ORDER — CARVEDILOL 12.5 MG PO TABS
12.5000 mg | ORAL_TABLET | Freq: Two times a day (BID) | ORAL | Status: DC
  Administered 2018-07-04 – 2018-07-06 (×4): 12.5 mg via ORAL
  Filled 2018-07-04 (×4): qty 1

## 2018-07-04 MED ORDER — NITROGLYCERIN 0.4 MG SL SUBL: 0.4000 mg | SUBLINGUAL_TABLET | SUBLINGUAL | Status: DC | PRN

## 2018-07-04 NOTE — Progress Notes (Signed)
Pt is eating lunch.  Pt states he feel a lot better but his legs are hurting but "they always hurt".  Pt states he do not have anymore chest pain.

## 2018-07-04 NOTE — Progress Notes (Signed)
PT left pt sitting in the chair and he is asleep at this time

## 2018-07-04 NOTE — Progress Notes (Signed)
Pt is resting comfortably after recieveing nitroglycering and tylenol.  Upon entering the room aroun 0715, pt was noted to be SOB, holding chest and was complaining of chest pain.  Night nurse adm nitro and tylenol. Will continue to assess and monitor pt for comfort and further treatment.

## 2018-07-04 NOTE — Progress Notes (Signed)
PT working with the pt  

## 2018-07-04 NOTE — Progress Notes (Signed)
Pt eating dinner, no verbal complaints of pain of the legs or chest pain as before.  Eating dinner and watching tv.  Will assist pt back to bed before the end of the shift

## 2018-07-04 NOTE — Progress Notes (Signed)
PROGRESS NOTE  John Peck VQQ:[id number] DOB: [birth year] DOA: 06/29/2018 PCP: Seward Carol, MD  HPI/Recap of past 24 hours: John Peck is a 84 y.o. male with a medical history of CHF, chronic kidney disease, paroxysmal atrial fibrillation not on anticoagulation, hypertension, hyperlipidemia, COPD, who presented to the emergency department with complaints of altered mental status. Information was provided by daughters via phone and I spoke to both daughters in person as well. Patient was seen normal on Sunday.  Monday he noted to have incoherent speech, profanity, and laughing inappropriately. At the nursing facility, [facility], it was thought that he could have a urinary tract infection and he was started on ciprofloxacin on 06/28/2018.  Per daughter, UA was negative. Daughter also reported noting a residual left sided facial droop which was worsening for the past couple of days. Daughter also reported worsening myoclonus over the past few weeks. Neurology was consulted and patient admitted for further management.   Today, pt noted to have chest pain early this am. Hx of stable angina. Upon my ROS, denied any further chest pain. No SOB, N/V.  Assessment/Plan: Active Problems:   PAF (paroxysmal atrial fibrillation) (HCC)   CAD in native artery   Hyperlipidemia   Facial droop   Pacemaker   COPD clinical dx/ no pfts on record   CKD (chronic kidney disease), stage III (HCC)   TIA (transient ischemic attack)  Acute metabolic encephalopathy Resolved ??TIA Vs AKI/dehydration, r/o source of infection Afebrile, with resolved leukocytosis Ammonia level WNL, TSH WNL CT head showed no acute findings MRI cannot be done due to pacemaker placement Chest x-ray unremarkable, UA negative for infection ECHO with EF of 40-50%, hypokinesis of inf myocardium Carotid Doppler in April 2019 showed right ICA stenosis 80 to 99%, left ICA stenosis 40 to 59%. Pt unable to follow up with vascular  surgery for possible stent placement Neurology consulted, recommend EEG EEG showed normal recording of awake and drowsy states Continue aspirin, Plavix, statin  UTI Reported dysuria Initial UC showed no growth, repeat UC showed 80,000 Proteus mirabilis Continue IV Rocephin   AKI on CKD stage III Improved Repeat UA showed some hematuria Renal USS unremarkable Creatinine on admission 2.2, baseline creatinine 1.3-1.5 S/P gentle IV fluids Daily BMP  Anemia of chronic kidney disease Stable, baseline hgb 9-10 Iron panel WNL Daily CBC  Chronic systolic and diastolic heart failure Appears compensated Echocardiogram 03/17/2018 showed EF of 45 to 50%, hypokinesis in the inferior myocardium Continue to Hold Lasix Monitor intake and output, daily weights  CAD/Hx of PVD Had an episode of chest pain this am, reports chronic toe pain  Status post CABG twice in 1989 and catheterization in 2013 Continue aspirin, statin, re-started Ranexa, imdur, lower dose of coreg, prn NTG  Paroxysmal Afib Not on anticoagulation due to history of GI bleed Continue Coreg  Hypothyroidism Continue Synthroid  Hyperlipidemia LDL at goal Continue statin  Chronic thrombocytopenia Daily CBC   Code Status: DNR  Family Communication: Daughter at bedside  Disposition Plan: Back to SNF, once insurance authorization is approved and work up complete. Likely 07/05/18   Consultants:  Neurology  Procedures:  None  Antimicrobials:  IV Ceftriaxone  DVT prophylaxis: Held lovenox due to thromocytopenia/hematuria SCDs   Objective: Vitals:   07/03/18 2340 07/04/18 0357 07/04/18 0834 07/04/18 1207  BP: (!) 147/85 (!) 147/68 (!) 161/97 132/88  Pulse: (!) 111 63 (!) 108 (!) 109  Resp: (!) 22 20 18 18   Temp: 97.9 F (36.6 C)  97.8 F (36.6 C) 98.1 F (36.7 C) 97.7 F (36.5 C)  TempSrc: Oral Oral Oral Oral  SpO2: 91% 98% 97% 99%  Weight:      Height:        Intake/Output Summary (Last  24 hours) at 07/04/2018 1528 Last data filed at 07/04/2018 1300 Gross per 24 hour  Intake 1120 ml  Output 1100 ml  Net 20 ml   Filed Weights   06/29/18 1142 06/29/18 2156  Weight: 71.2 kg 65.3 kg    Exam:   General: NAD  Cardiovascular: S1, S2 present  Respiratory: CTA B  Abdomen: Soft, nontender, nondistended, bowel sounds present  Musculoskeletal: No pedal edema bilaterally  Skin: Normal  Psychiatry: Normal mood  Neuro: No focal neurologic deficits noted, 5/5 strength present in all 4 extremities   Data Reviewed: CBC: Recent Labs  Lab 06/30/18 0743 07/01/18 0350 07/02/18 0410 07/03/18 0630 07/04/18 0438  WBC 4.2 12.9* 6.5 4.6 4.6  NEUTROABS 2.3 11.7* 5.0 2.7 2.6  HGB 10.4* 9.5* 9.2* 8.6* 9.5*  HCT 31.8* 29.6* 28.9* 26.1* 29.2*  MCV 95.5 98.0 99.7 98.1 97.3  PLT 129* 117* 103* 107* 563*   Basic Metabolic Panel: Recent Labs  Lab 06/30/18 0743 07/01/18 0350 07/02/18 0410 07/03/18 0630 07/04/18 0438  NA 142 141 143 144 143  K 3.6 3.4* 3.6 4.0 3.9  CL 108 112* 113* 115* 112*  CO2 25 22 21* 23 24  GLUCOSE 85 113* 101* 97 113*  BUN 28* 35* 34* 27* 24*  CREATININE 1.62* 1.64* 1.42* 1.48* 1.21  CALCIUM 8.6* 8.1* 8.3* 8.1* 8.2*   GFR: Estimated Creatinine Clearance: 36 mL/min (by C-G formula based on SCr of 1.21 mg/dL). Liver Function Tests: Recent Labs  Lab 06/29/18 1215  AST 31  ALT 11  ALKPHOS 64  BILITOT 1.1  PROT 7.6  ALBUMIN 3.8   No results for input(s): LIPASE, AMYLASE in the last 168 hours. Recent Labs  Lab 06/30/18 0204  AMMONIA 18   Coagulation Profile: Recent Labs  Lab 06/29/18 1215  INR 1.24   Cardiac Enzymes: No results for input(s): CKTOTAL, CKMB, CKMBINDEX, TROPONINI in the last 168 hours. BNP (last 3 results) No results for input(s): PROBNP in the last 8760 hours. HbA1C: No results for input(s): HGBA1C in the last 72 hours. CBG: Recent Labs  Lab 06/29/18 1158 06/29/18 1350 06/29/18 1542 06/29/18 2102  07/02/18 2031  GLUCAP 89 74 97 86 114*   Lipid Profile: No results for input(s): CHOL, HDL, LDLCALC, TRIG, CHOLHDL, LDLDIRECT in the last 72 hours. Thyroid Function Tests: No results for input(s): TSH, T4TOTAL, FREET4, T3FREE, THYROIDAB in the last 72 hours. Anemia Panel: Recent Labs    07/04/18 0438  FERRITIN 473*  TIBC 151*  IRON 55   Urine analysis:    Component Value Date/Time   COLORURINE AMBER (A) 07/01/2018 1222   APPEARANCEUR CLOUDY (A) 07/01/2018 1222   LABSPEC 1.017 07/01/2018 1222   PHURINE 8.0 07/01/2018 1222   GLUCOSEU NEGATIVE 07/01/2018 1222   HGBUR MODERATE (A) 07/01/2018 1222   BILIRUBINUR NEGATIVE 07/01/2018 1222   KETONESUR NEGATIVE 07/01/2018 1222   PROTEINUR >=300 (A) 07/01/2018 1222   UROBILINOGEN 0.2 05/09/2015 1523   NITRITE NEGATIVE 07/01/2018 1222   LEUKOCYTESUR MODERATE (A) 07/01/2018 1222   Sepsis Labs: @LABRCNTIP (procalcitonin:4,lacticidven:4)  ) Recent Results (from the past 240 hour(s))  Urine culture     Status: None   Collection Time: 06/29/18 11:55 AM  Result Value Ref Range Status   Specimen Description  Final    URINE, CATHETERIZED Performed at [facility], [place] [address], [place], [place] [address]    Special Requests   Final    NONE Performed at [facility], [place] [address], [place], [place] [address]    Culture   Final    NO GROWTH Performed at [facility], [place] [address] [place], [place] [address]    Report Status 06/30/2018 FINAL  Final  Urine Culture     Status: Abnormal   Collection Time: 07/02/18  1:10 PM  Result Value Ref Range Status   Specimen Description URINE, RANDOM  Final   Special Requests   Final    NONE Performed at [facility], [place] [address], [place], [place] [address]    Culture 80,000 COLONIES/mL PROTEUS MIRABILIS (A)  Final   Report Status 07/04/2018 FINAL  Final   Organism ID, Bacteria PROTEUS MIRABILIS (A)  Final      Susceptibility    Proteus mirabilis - MIC*    AMPICILLIN <=2 SENSITIVE Sensitive     CEFAZOLIN 8 SENSITIVE Sensitive     CEFTRIAXONE <=1 SENSITIVE Sensitive     CIPROFLOXACIN >=4 RESISTANT Resistant     GENTAMICIN <=1 SENSITIVE Sensitive     IMIPENEM 8 INTERMEDIATE Intermediate     NITROFURANTOIN 128 RESISTANT Resistant     TRIMETH/SULFA >=320 RESISTANT Resistant     AMPICILLIN/SULBACTAM <=2 SENSITIVE Sensitive     PIP/TAZO <=4 SENSITIVE Sensitive     * 80,000 COLONIES/mL PROTEUS MIRABILIS      Studies: No results found.  Scheduled Meds: . aspirin  81 mg Oral Daily  . atorvastatin  40 mg Oral Daily  . carvedilol  12.5 mg Oral BID WC  . clopidogrel  75 mg Oral Daily  . ezetimibe  10 mg Oral Daily  . isosorbide mononitrate  90 mg Oral Daily  . latanoprost  1 drop Both Eyes Once per day on Mon Thu  . levothyroxine  125 mcg Oral QAC breakfast  . pantoprazole  40 mg Oral Daily  . polyethylene glycol  17 g Oral Daily  . ranolazine  1,000 mg Oral BID  . senna  2 tablet Oral BID  . tamsulosin  0.4 mg Oral Daily    Continuous Infusions: . cefTRIAXone (ROCEPHIN)  IV 1 g (07/04/18 1431)     LOS: 0 days     Alma Friendly, MD [facility]   If 7PM-7AM, please contact night-coverage [email or web address] Password [facility] 07/04/2018, 3:28 PM

## 2018-07-04 NOTE — Progress Notes (Signed)
Administered nitroglycerin x2 to the patient for chest pain, reassessed patient, chest pain relieved.

## 2018-07-04 NOTE — Progress Notes (Signed)
Occupational Therapy Treatment Patient Details Name: John Peck MRN: [id number] DOB: 1923/12/08 Today's Date: 07/04/2018    History of present illness 84 y.o. male with a history of hypertension, hyperlipidemia, stroke, A. Fib(not on anticoagulation due to GI bleed) who presents with progressive confusion and myoclonus.   OT comments  Pt very lethargic this pm, and falling asleep mid activity.  He requires max A for LB ADLs, unable to fully participate due to lethargy.  Will continue to follow.   Follow Up Recommendations  SNF    Equipment Recommendations  None recommended by OT    Recommendations for Other Services      Precautions / Restrictions Precautions Precautions: Fall Precaution Comments: myoclonus BLE Restrictions Weight Bearing Restrictions: No       Mobility Bed Mobility Overal bed mobility: Needs Assistance Bed Mobility: Supine to Sit     Supine to sit: HOB elevated;Min assist     General bed mobility comments: min A for trunk elevation away rom back of bed  Transfers Overall transfer level: Needs assistance Equipment used: 1 person hand held assist Transfers: Sit to/from Stand Sit to Stand: Min assist         General transfer comment: assist to stand and for balance     Balance Overall balance assessment: Needs assistance Sitting-balance support: Feet supported;Single extremity supported Sitting balance-Leahy Scale: Fair     Standing balance support: Bilateral upper extremity supported Standing balance-Leahy Scale: Poor Standing balance comment: UE support for standing balance                           ADL either performed or assessed with clinical judgement   ADL Overall ADL's : Needs assistance/impaired             Lower Body Bathing: Maximal assistance;Sit to/from stand Lower Body Bathing Details (indicate cue type and reason): max A to don/doff sock on Lt foot due to pain, mod A on Rt due to fatigue and falling  asleep,  max A to pull pants over hips                      Functional mobility during ADLs: Minimal assistance;Rolling walker       Vision       Perception     Praxis      Cognition Arousal/Alertness: Lethargic Behavior During Therapy: WFL for tasks assessed/performed Overall Cognitive Status: No family/caregiver present to determine baseline cognitive functioning Area of Impairment: Attention;Following commands                 Orientation Level: Situation;Time;Place Current Attention Level: Focused;Sustained Memory: Decreased recall of precautions;Decreased short-term memory Following Commands: Follows one step commands inconsistently;Follows one step commands with increased time Safety/Judgement: Decreased awareness of safety Awareness: Intellectual Problem Solving: Decreased initiation;Requires verbal cues;Slow processing;Requires tactile cues General Comments: Pt very lethargic and falling asleep mid activity         Exercises Exercises: General Lower Extremity General Exercises - Lower Extremity Ankle Circles/Pumps: AROM;Both;10 reps;Supine Heel Slides: AROM;Both;10 reps;Supine   Shoulder Instructions       General Comments      Pertinent Vitals/ Pain       Pain Assessment: Faces Faces Pain Scale: Hurts little more Pain Location: L heel Pain Descriptors / Indicators: Grimacing Pain Intervention(s): Limited activity within patient's tolerance;Monitored during session;Repositioned  Home Living  Prior Functioning/Environment              Frequency  Min 2X/week        Progress Toward Goals  OT Goals(current goals can now be found in the care plan section)  Progress towards OT goals: Not progressing toward goals - comment(lethargy )  Acute Rehab OT Goals Patient Stated Goal: rehab if needed then home per daughters  Plan Discharge plan remains appropriate     Co-evaluation                 AM-PAC PT "6 Clicks" Daily Activity     Outcome Measure   Help from another person eating meals?: A Little Help from another person taking care of personal grooming?: A Little Help from another person toileting, which includes using toliet, bedpan, or urinal?: A Lot Help from another person bathing (including washing, rinsing, drying)?: A Lot Help from another person to put on and taking off regular upper body clothing?: A Lot Help from another person to put on and taking off regular lower body clothing?: A Lot 6 Click Score: 14    End of Session    OT Visit Diagnosis: Unsteadiness on feet (R26.81);Pain;Muscle weakness (generalized) (M62.81);Other abnormalities of gait and mobility (R26.89);Other symptoms and signs involving cognitive function   Activity Tolerance Patient limited by lethargy   Patient Left in chair;with call bell/phone within reach;with chair alarm set   Nurse Communication Mobility status        Time: 1441-1451 OT Time Calculation (min): 10 min  Charges: OT General Charges $OT Visit: 1 Visit OT Treatments $Self Care/Home Management : 8-22 mins  [facility], OTR/L [phone]    Lucille Passy M 07/04/2018, 2:57 PM

## 2018-07-04 NOTE — Progress Notes (Signed)
Physical Therapy Treatment Patient Details Name: John Peck MRN: [id number] DOB: 12/11/1923 Today's Date: 07/04/2018    History of Present Illness 84 y.o. male with a history of hypertension, hyperlipidemia, stroke, A. Fib(not on anticoagulation due to GI bleed) who presents with progressive confusion and myoclonus.    PT Comments    Pt c/o L heel pain in standing with weightbearing. Ambulated 86' x2 with seated rest break on rollator with min A to return to standing from rollator. Pt not oriented to place or time today. SOB noted after ambulation with HR 105 bpm and O2 sats 95% on RA, HR before session was 56 bpm and O2 sats 92% on RA with pt in supine. PT will continue to follow.    Follow Up Recommendations  SNF;Supervision/Assistance - 24 hour     Equipment Recommendations  None recommended by PT    Recommendations for Other Services       Precautions / Restrictions Precautions Precautions: Fall Precaution Comments: myoclonus BLE Restrictions Weight Bearing Restrictions: No    Mobility  Bed Mobility Overal bed mobility: Needs Assistance Bed Mobility: Supine to Sit     Supine to sit: HOB elevated;Min assist     General bed mobility comments: min A for trunk elevation away rom back of bed  Transfers Overall transfer level: Needs assistance Equipment used: 4-wheeled walker Transfers: Sit to/from Stand Sit to Stand: Min assist         General transfer comment: min A for power up from bed as well as RW, steady when getting up from bed, needed steadying assist with standing from rollator  Ambulation/Gait Ambulation/Gait assistance: Min assist Gait Distance (Feet): 70 Feet(2x) Assistive device: 4-wheeled walker Gait Pattern/deviations: Step-through pattern;Decreased stride length;Trunk flexed Gait velocity: decreased Gait velocity interpretation: <1.8 ft/sec, indicate of risk for recurrent falls General Gait Details: vc's for trunk extension, pt c/o L  heel pain in [place]. Needed seated rest break after 70'. Pt with SOB at end of ambulation with HR 105 bpm an O2 sats 95% on RA   Stairs             Wheelchair Mobility    Modified Rankin (Stroke Patients Only)       Balance Overall balance assessment: Needs assistance Sitting-balance support: Feet supported;Single extremity supported Sitting balance-Leahy Scale: Fair     Standing balance support: Bilateral upper extremity supported Standing balance-Leahy Scale: Poor Standing balance comment: UE support for standing balance                            Cognition Arousal/Alertness: Awake/alert Behavior During Therapy: WFL for tasks assessed/performed Overall Cognitive Status: No family/caregiver present to determine baseline cognitive functioning Area of Impairment: Following commands;Safety/judgement;Problem solving;Orientation                 Orientation Level: Situation;Time;Place Current Attention Level: Selective Memory: Decreased recall of precautions;Decreased short-term memory Following Commands: Follows one step commands consistently;Follows multi-step commands with increased time Safety/Judgement: Decreased awareness of safety Awareness: Intellectual Problem Solving: Decreased initiation;Requires verbal cues;Slow processing;Requires tactile cues General Comments: pt not oriented to place or time today, asked me if I was going back down to the play      Exercises General Exercises - Lower Extremity Ankle Circles/Pumps: AROM;Both;10 reps;Supine Heel Slides: AROM;Both;10 reps;Supine    General Comments        Pertinent Vitals/Pain Pain Assessment: Faces Faces Pain Scale: Hurts little more Pain Location: L heel Pain  Descriptors / Indicators: Grimacing Pain Intervention(s): Limited activity within patient's tolerance;Monitored during session    Home Living                      Prior Function            PT Goals (current  goals can now be found in the care plan section) Acute Rehab PT Goals Patient Stated Goal: rehab if needed then home per daughters PT Goal Formulation: With patient Time For Goal Achievement: 07/07/18 Potential to Achieve Goals: Good Progress towards PT goals: Progressing toward goals    Frequency    Min 2X/week      PT Plan Current plan remains appropriate;Frequency needs to be updated    Co-evaluation              AM-PAC PT "6 Clicks" Daily Activity  Outcome Measure  Difficulty turning over in bed (including adjusting bedclothes, sheets and blankets)?: Unable Difficulty moving from lying on back to sitting on the side of the bed? : Unable Difficulty sitting down on and standing up from a chair with arms (e.g., wheelchair, bedside commode, etc,.)?: Unable Help needed moving to and from a bed to chair (including a wheelchair)?: A Little Help needed walking in hospital room?: A Little Help needed climbing 3-5 steps with a railing? : A Lot 6 Click Score: 11    End of Session Equipment Utilized During Treatment: Gait belt Activity Tolerance: Patient tolerated treatment well Patient left: with call bell/phone within reach;in chair;with chair alarm set Nurse Communication: Mobility status PT Visit Diagnosis: Other abnormalities of gait and mobility (R26.89);Muscle weakness (generalized) (M62.81)     Time: 2947-6546 PT Time Calculation (min) (ACUTE ONLY): 25 min  Charges:  $Gait Training: 23-37 mins                     [place]  [place] 07/04/2018, 2:19 PM

## 2018-07-05 DIAGNOSIS — I2510 Atherosclerotic heart disease of native coronary artery without angina pectoris: Secondary | ICD-10-CM | POA: Diagnosis not present

## 2018-07-05 DIAGNOSIS — Z950 Presence of cardiac pacemaker: Secondary | ICD-10-CM | POA: Diagnosis not present

## 2018-07-05 DIAGNOSIS — E785 Hyperlipidemia, unspecified: Secondary | ICD-10-CM | POA: Diagnosis not present

## 2018-07-05 DIAGNOSIS — G459 Transient cerebral ischemic attack, unspecified: Secondary | ICD-10-CM | POA: Diagnosis not present

## 2018-07-05 DIAGNOSIS — R29810 Facial weakness: Secondary | ICD-10-CM | POA: Diagnosis not present

## 2018-07-05 DIAGNOSIS — J449 Chronic obstructive pulmonary disease, unspecified: Secondary | ICD-10-CM | POA: Diagnosis not present

## 2018-07-05 DIAGNOSIS — I5042 Chronic combined systolic (congestive) and diastolic (congestive) heart failure: Secondary | ICD-10-CM | POA: Diagnosis not present

## 2018-07-05 DIAGNOSIS — I639 Cerebral infarction, unspecified: Secondary | ICD-10-CM | POA: Diagnosis not present

## 2018-07-05 DIAGNOSIS — N183 Chronic kidney disease, stage 3 (moderate): Secondary | ICD-10-CM | POA: Diagnosis not present

## 2018-07-05 DIAGNOSIS — I480 Paroxysmal atrial fibrillation: Secondary | ICD-10-CM | POA: Diagnosis not present

## 2018-07-05 MED ORDER — CEFDINIR 300 MG PO CAPS
300.0000 mg | ORAL_CAPSULE | Freq: Two times a day (BID) | ORAL | Status: DC
  Filled 2018-07-05: qty 1

## 2018-07-05 NOTE — Progress Notes (Signed)
PROGRESS NOTE  John Peck NFA:[id number] DOB: 1924/09/13 DOA: 06/29/2018 PCP: Seward Carol, MD  HPI/Recap of past 24 hours: John Peck is a 84 y.o. male with a medical history of CHF, chronic kidney disease, paroxysmal atrial fibrillation not on anticoagulation, hypertension, hyperlipidemia, COPD, who presented to the emergency department with complaints of altered mental status. Information was provided by daughters via phone and I spoke to both daughters in person as well. Patient was seen normal on Sunday.  Monday he noted to have incoherent speech, profanity, and laughing inappropriately. At the nursing facility, [facility], it was thought that he could have a urinary tract infection and he was started on ciprofloxacin on 06/28/2018.  Per daughter, UA was negative. Daughter also reported noting a residual left sided facial droop which was worsening for the past couple of days. Daughter also reported worsening myoclonus over the past few weeks. Neurology was consulted and patient admitted for further management.   Today, pt denies any new complaints.  Assessment/Plan: Active Problems:   PAF (paroxysmal atrial fibrillation) (HCC)   CAD in native artery   Hyperlipidemia   Facial droop   Pacemaker   COPD clinical dx/ no pfts on record   CKD (chronic kidney disease), stage III (HCC)   TIA (transient ischemic attack)  Acute metabolic encephalopathy Resolved ??TIA Vs AKI/dehydration, r/o source of infection Afebrile, with resolved leukocytosis Ammonia level WNL, TSH WNL CT head showed no acute findings MRI cannot be done due to pacemaker placement Chest x-ray unremarkable, UA negative for infection ECHO with EF of 40-50%, hypokinesis of inf myocardium Carotid Doppler in April 2019 showed right ICA stenosis 80 to 99%, left ICA stenosis 40 to 59%. Pt unable to follow up with vascular surgery for possible stent placement Neurology consulted, recommend EEG EEG showed normal  recording of awake and drowsy states Continue aspirin, Plavix, statin  UTI Reported dysuria Initial UC showed no growth, repeat UC showed 80,000 Proteus mirabilis S/P IV Rocephin X 3 days, switched to PO cefdinir for a total of 7 days   AKI on CKD stage III Improved Repeat UA showed some hematuria Renal USS unremarkable Creatinine on admission 2.2, baseline creatinine 1.3-1.5 S/P gentle IV fluids Daily BMP  Anemia of chronic kidney disease Stable, baseline hgb 9-10 Iron panel WNL Daily CBC  Chronic systolic and diastolic heart failure Appears compensated Echocardiogram 03/17/2018 showed EF of 45 to 50%, hypokinesis in the inferior myocardium Continue to Hold Lasix Monitor intake and output, daily weights  CAD/Hx of PVD Hx of stable angina, reports chronic toe pain  Status post CABG twice in 1989 and catheterization in 2013 Continue aspirin, statin, re-started Ranexa, imdur, coreg, prn NTG  Paroxysmal Afib Not on anticoagulation due to history of GI bleed Continue Coreg  Hypothyroidism Continue Synthroid  Hyperlipidemia LDL at goal Continue statin  Chronic thrombocytopenia Daily CBC   Code Status: DNR  Family Communication: None at bedside  Disposition Plan: Back to SNF, once insurance authorization is approved. Likely 07/06/18   Consultants:  Neurology  Procedures:  None  Antimicrobials:  PO Cefdinir  DVT prophylaxis: Held lovenox due to thromocytopenia/hematuria SCDs   Objective: Vitals:   07/05/18 0736 07/05/18 1220 07/05/18 1251 07/05/18 1551  BP: (!) 173/72 128/64  131/68  Pulse: 65 63 72 68  Resp: 18 18 18 18   Temp: 98.3 F (36.8 C) 98.1 F (36.7 C)  98.2 F (36.8 C)  TempSrc: Oral Oral  Oral  SpO2: 99% 100% 94% 100%  Weight:  Height:        Intake/Output Summary (Last 24 hours) at 07/05/2018 1642 Last data filed at 07/05/2018 1100 Gross per 24 hour  Intake 840 ml  Output 650 ml  Net 190 ml   Filed Weights    06/29/18 1142 06/29/18 2156  Weight: 71.2 kg 65.3 kg    Exam:   General: NAD  Cardiovascular: S1, S2 present  Respiratory: CTA B  Abdomen: Soft, nontender, nondistended, bowel sounds present  Musculoskeletal: No pedal edema bilaterally  Skin: Normal  Psychiatry: Normal mood  Neuro: No focal neurologic deficits noted, 5/5 strength present in all 4 extremities   Data Reviewed: CBC: Recent Labs  Lab 06/30/18 0743 07/01/18 0350 07/02/18 0410 07/03/18 0630 07/04/18 0438  WBC 4.2 12.9* 6.5 4.6 4.6  NEUTROABS 2.3 11.7* 5.0 2.7 2.6  HGB 10.4* 9.5* 9.2* 8.6* 9.5*  HCT 31.8* 29.6* 28.9* 26.1* 29.2*  MCV 95.5 98.0 99.7 98.1 97.3  PLT 129* 117* 103* 107* 330*   Basic Metabolic Panel: Recent Labs  Lab 06/30/18 0743 07/01/18 0350 07/02/18 0410 07/03/18 0630 07/04/18 0438  NA 142 141 143 144 143  K 3.6 3.4* 3.6 4.0 3.9  CL 108 112* 113* 115* 112*  CO2 25 22 21* 23 24  GLUCOSE 85 113* 101* 97 113*  BUN 28* 35* 34* 27* 24*  CREATININE 1.62* 1.64* 1.42* 1.48* 1.21  CALCIUM 8.6* 8.1* 8.3* 8.1* 8.2*   GFR: Estimated Creatinine Clearance: 36 mL/min (by C-G formula based on SCr of 1.21 mg/dL). Liver Function Tests: Recent Labs  Lab 06/29/18 1215  AST 31  ALT 11  ALKPHOS 64  BILITOT 1.1  PROT 7.6  ALBUMIN 3.8   No results for input(s): LIPASE, AMYLASE in the last 168 hours. Recent Labs  Lab 06/30/18 0204  AMMONIA 18   Coagulation Profile: Recent Labs  Lab 06/29/18 1215  INR 1.24   Cardiac Enzymes: No results for input(s): CKTOTAL, CKMB, CKMBINDEX, TROPONINI in the last 168 hours. BNP (last 3 results) No results for input(s): PROBNP in the last 8760 hours. HbA1C: No results for input(s): HGBA1C in the last 72 hours. CBG: Recent Labs  Lab 06/29/18 1158 06/29/18 1350 06/29/18 1542 06/29/18 2102 07/02/18 2031  GLUCAP 89 74 97 86 114*   Lipid Profile: No results for input(s): CHOL, HDL, LDLCALC, TRIG, CHOLHDL, LDLDIRECT in the last 72  hours. Thyroid Function Tests: No results for input(s): TSH, T4TOTAL, FREET4, T3FREE, THYROIDAB in the last 72 hours. Anemia Panel: Recent Labs    07/04/18 0438  FERRITIN 473*  TIBC 151*  IRON 55   Urine analysis:    Component Value Date/Time   COLORURINE AMBER (A) 07/01/2018 1222   APPEARANCEUR CLOUDY (A) 07/01/2018 1222   LABSPEC 1.017 07/01/2018 1222   PHURINE 8.0 07/01/2018 1222   GLUCOSEU NEGATIVE 07/01/2018 1222   HGBUR MODERATE (A) 07/01/2018 1222   BILIRUBINUR NEGATIVE 07/01/2018 1222   KETONESUR NEGATIVE 07/01/2018 1222   PROTEINUR >=300 (A) 07/01/2018 1222   UROBILINOGEN 0.2 05/09/2015 1523   NITRITE NEGATIVE 07/01/2018 1222   LEUKOCYTESUR MODERATE (A) 07/01/2018 1222   Sepsis Labs: @LABRCNTIP (procalcitonin:4,lacticidven:4)  ) Recent Results (from the past 240 hour(s))  Urine culture     Status: None   Collection Time: 06/29/18 11:55 AM  Result Value Ref Range Status   Specimen Description   Final    URINE, CATHETERIZED Performed at [facility], [place] [address], [place], [place] [address]    Special Requests   Final  NONE Performed at [facility], [place] [address], [place], [place] [address]    Culture   Final    NO GROWTH Performed at [facility], [place] [address] [place], [place] [address]    Report Status 06/30/2018 FINAL  Final  Urine Culture     Status: Abnormal   Collection Time: 07/02/18  1:10 PM  Result Value Ref Range Status   Specimen Description URINE, RANDOM  Final   Special Requests   Final    NONE Performed at [facility], [place] [address], [place], [place] [address]    Culture 80,000 COLONIES/mL PROTEUS MIRABILIS (A)  Final   Report Status 07/04/2018 FINAL  Final   Organism ID, Bacteria PROTEUS MIRABILIS (A)  Final      Susceptibility   Proteus mirabilis - MIC*    AMPICILLIN <=2 SENSITIVE Sensitive     CEFAZOLIN 8 SENSITIVE Sensitive     CEFTRIAXONE <=1 SENSITIVE Sensitive      CIPROFLOXACIN >=4 RESISTANT Resistant     GENTAMICIN <=1 SENSITIVE Sensitive     IMIPENEM 8 INTERMEDIATE Intermediate     NITROFURANTOIN 128 RESISTANT Resistant     TRIMETH/SULFA >=320 RESISTANT Resistant     AMPICILLIN/SULBACTAM <=2 SENSITIVE Sensitive     PIP/TAZO <=4 SENSITIVE Sensitive     * 80,000 COLONIES/mL PROTEUS MIRABILIS      Studies: No results found.  Scheduled Meds: . aspirin  81 mg Oral Daily  . atorvastatin  40 mg Oral Daily  . carvedilol  12.5 mg Oral BID WC  . clopidogrel  75 mg Oral Daily  . ezetimibe  10 mg Oral Daily  . isosorbide mononitrate  90 mg Oral Daily  . latanoprost  1 drop Both Eyes Once per day on Mon Thu  . levothyroxine  125 mcg Oral QAC breakfast  . pantoprazole  40 mg Oral Daily  . polyethylene glycol  17 g Oral Daily  . ranolazine  1,000 mg Oral BID  . senna  2 tablet Oral BID  . tamsulosin  0.4 mg Oral Daily    Continuous Infusions: . cefTRIAXone (ROCEPHIN)  IV 1 g (07/04/18 1431)     LOS: 0 days     Alma Friendly, MD [facility]   If 7PM-7AM, please contact night-coverage [email or web address] Password [facility] 07/05/2018, 4:42 PM

## 2018-07-05 NOTE — Clinical Social Work Note (Signed)
Authorization still pending. SNF admissions coordinator will call and follow up this morning. CSW paged MD to notify.  Dayton Scrape, [place]

## 2018-07-06 ENCOUNTER — Ambulatory Visit: Payer: Medicare HMO | Admitting: Adult Health

## 2018-07-06 DIAGNOSIS — G9341 Metabolic encephalopathy: Secondary | ICD-10-CM

## 2018-07-06 DIAGNOSIS — M2550 Pain in unspecified joint: Secondary | ICD-10-CM | POA: Diagnosis not present

## 2018-07-06 DIAGNOSIS — Z7401 Bed confinement status: Secondary | ICD-10-CM | POA: Diagnosis not present

## 2018-07-06 LAB — BASIC METABOLIC PANEL
Anion gap: 8 (ref 5–15)
BUN: 22 mg/dL (ref 8–23)
CALCIUM: 8.4 mg/dL — AB (ref 8.9–10.3)
CO2: 23 mmol/L (ref 22–32)
CREATININE: 1.11 mg/dL (ref 0.61–1.24)
Chloride: 110 mmol/L (ref 98–111)
GFR calc Af Amer: >60 mL/min (ref >60)
GFR calc non Af Amer: 56 mL/min — ABNORMAL LOW (ref >60)
GLUCOSE: 85 mg/dL (ref 70–99)
Potassium: 4.2 mmol/L (ref 3.5–5.1)
Sodium: 141 mmol/L (ref 135–145)

## 2018-07-06 LAB — CBC WITH DIFFERENTIAL/PLATELET
Abs Immature Granulocytes: 0 10*3/uL (ref 0.0–0.1)
BASOS PCT: 1 %
Basophils Absolute: 0 10*3/uL (ref 0.0–0.1)
EOS ABS: 0.4 10*3/uL (ref 0.0–0.7)
EOS PCT: 8 %
HEMATOCRIT: 28.7 % — AB (ref 39.0–52.0)
Hemoglobin: 9.5 g/dL — ABNORMAL LOW (ref 13.0–17.0)
Immature Granulocytes: 1 %
LYMPHS ABS: 1 10*3/uL (ref 0.7–4.0)
Lymphocytes Relative: 22 %
MCH: 31.9 pg (ref 26.0–34.0)
MCHC: 33.1 g/dL (ref 30.0–36.0)
MCV: 96.3 fL (ref 78.0–100.0)
MONO ABS: 0.5 10*3/uL (ref 0.1–1.0)
MONOS PCT: 12 %
Neutro Abs: 2.7 10*3/uL (ref 1.7–7.7)
Neutrophils Relative %: 56 %
PLATELETS: 136 10*3/uL — AB (ref 150–400)
RBC: 2.98 MIL/uL — ABNORMAL LOW (ref 4.22–5.81)
RDW: 15.9 % — AB (ref 11.5–15.5)
WBC: 4.7 10*3/uL (ref 4.0–10.5)

## 2018-07-06 MED ORDER — CEFDINIR 300 MG PO CAPS: 300.0000 mg | ORAL_CAPSULE | Freq: Two times a day (BID) | ORAL | 0 refills | Status: AC

## 2018-07-06 MED ORDER — NITROGLYCERIN 0.4 MG SL SUBL: 0.4000 mg | SUBLINGUAL_TABLET | SUBLINGUAL | 12 refills | Status: DC | PRN

## 2018-07-06 NOTE — Discharge Summary (Signed)
Physician Discharge Summary  John Peck EZM:[id number] DOB: 22-Mar-1924 DOA: 06/29/2018  PCP: Seward Carol, MD  Admit date: 06/29/2018 Discharge date: 07/06/2018  Time spent: 45 minutes  Recommendations for Outpatient Follow-up:  -Will be discharged back to skilled nursing facility today. -Continue cefdinir for total of 5 days on discharge for UTI.  Discharge Diagnoses:  Active Problems:   PAF (paroxysmal atrial fibrillation) (HCC)   CAD in native artery   Hyperlipidemia   Facial droop   Pacemaker   COPD clinical dx/ no pfts on record   CKD (chronic kidney disease), stage III ([place])   TIA (transient ischemic attack)   Discharge Condition: Stable and improved  Filed Weights   06/29/18 1142 06/29/18 2156  Weight: 71.2 kg 65.3 kg    History of present illness:  As per Dr. Ree Kida on 8/21:  John Peck is a 84 y.o. male with a medical history of heart failure, chronic kidney disease, paroxysmal atrial fibrillation not on anticoagulation, hypertension, hyperlipidemia, COPD, who presented to the emergency department with complaints of altered mental status.  Information provided by daughters via phone.  Patient was seen normal on Sunday.  Monday he was started having altered mental status with incoherent speech, profanity, and laughing inappropriately.   At the nursing facility, [facility], it was thought that he could have a urinary tract infection and he was started on ciprofloxacin on 06/28/2018.  Per daughter, UA was negative.  Daughter states this morning she saw her father and noted that he had a left sided facial droop which was new.  She also noted that he was "subdued".  They deny any other symptoms or complaints from him recently.    ED Course: CT had obtained which is unremarkable for acute process.  UA also unremarkable for infection.  TRH called for admission.  Hospital Course:   Acute metabolic encephalopathy -Resolved, cause is not entirely  clear. -His urine culture did grow 80,000 colonies of Proteus, because of his confusion we have decided to treat as a true infection.  He received 3 days of Rocephin and was transitioned to oral Ceftin ear.  Has 5 days remaining on discharge. -Chest x-ray was unremarkable. -2D echo showed an ejection fraction of 40 to 55%.  Carotid Doppler in April 2019 showed right ICA stenosis of 80 to 99% and left ICA stenosis of 40 to 59%.  Patient has been informed that he needs to follow-up with vascular surgery given his high-grade right ICA stenosis. -Neurology was consulted, they performed an EEG that was normal. -Ammonia level is within normal limits, TSH is within normal limits. -CT head showed no acute findings, unable to do MRI due to permanent pacemaker. -Plan to continue aspirin, Plavix, statin.  UTI -Due to Proteus, as above treat with Ceftin ear for 5 more days on discharge.  Chronic kidney disease stage III -Creatinine is back down to his baseline of around 1.2-1.5 on discharge. -Suspect related to dehydration and UTI.  Chronic combined heart failure -Compensated, no medication changes while hospitalized.  Paroxysmal atrial fibrillation -Not anticoagulated due to history of GI bleed. -Rate controlled on Coreg.  Hypothyroidism -Continue Synthroid.  Hyperlipidemia -Continue statin.  Procedures:  As above  Consultations:  Neurology  Discharge Instructions  Discharge Instructions    Diet - low sodium heart healthy   Complete by:  As directed    Increase activity slowly   Complete by:  As directed      Allergies as of 07/06/2018  No Known Allergies     Medication List    STOP taking these medications   ciprofloxacin 250 MG tablet Commonly known as:  CIPRO     TAKE these medications   acetaminophen 325 MG tablet Commonly known as:  TYLENOL Take 650 mg by mouth 3 (three) times daily.   albuterol 108 (90 Base) MCG/ACT inhaler Commonly known as:  PROVENTIL  HFA;VENTOLIN HFA Inhale 2 puffs into the lungs every 4 (four) hours as needed for wheezing or shortness of breath (or coughing).   ALPRAZolam 0.25 MG tablet Commonly known as:  XANAX Take 1 tablet (0.25 mg total) by mouth 2 (two) times daily as needed for anxiety.   aspirin 81 MG tablet Take 81 mg by mouth daily.   atorvastatin 40 MG tablet Commonly known as:  LIPITOR Take 1 tablet (40 mg total) by mouth daily. What changed:  when to take this   carvedilol 12.5 MG tablet Commonly known as:  COREG Take 1 tablet (12.5 mg total) by mouth 2 (two) times daily with a meal.   cefdinir 300 MG capsule Commonly known as:  OMNICEF Take 1 capsule (300 mg total) by mouth every 12 (twelve) hours for 5 days.   clopidogrel 75 MG tablet Commonly known as:  PLAVIX Take 1 tablet (75 mg total) by mouth daily.   ezetimibe 10 MG tablet Commonly known as:  ZETIA Take 10 mg by mouth daily.   ferrous sulfate 325 (65 FE) MG tablet Take 1 tablet (325 mg total) by mouth 2 (two) times daily with a meal.   furosemide 20 MG tablet Commonly known as:  LASIX Take 2 tablets (40 mg total) by mouth 2 (two) times daily. What changed:  how much to take   ipratropium-albuterol 0.5-2.5 (3) MG/3ML Soln Commonly known as:  DUONEB Take 3 mLs by nebulization 2 (two) times daily. What changed:  when to take this   isosorbide mononitrate 30 MG 24 hr tablet Commonly known as:  IMDUR Take 3 tablets (90 mg total) by mouth daily.   latanoprost 0.005 % ophthalmic solution Commonly known as:  XALATAN Place 1 drop into both eyes 2 (two) times a week. On mondays and wednesdays   levothyroxine 125 MCG tablet Commonly known as:  SYNTHROID, LEVOTHROID TAKE 1 TABLET BY MOUTH EVERY DAY BEFORE BREAKFAST What changed:  See the new instructions.   mirtazapine 7.5 MG tablet Commonly known as:  REMERON Take 7.5 mg by mouth at bedtime.   nitroGLYCERIN 0.4 MG SL tablet Commonly known as:  NITROSTAT Place 1 tablet (0.4  mg total) under the tongue every 5 (five) minutes as needed for chest pain.   pantoprazole 40 MG tablet Commonly known as:  PROTONIX Take 1 tablet (40 mg total) by mouth daily.   polyethylene glycol packet Commonly known as:  MIRALAX / GLYCOLAX Take 17 g by mouth daily.   ranolazine 1000 MG SR tablet Commonly known as:  RANEXA Take 1 tablet (1,000 mg total) by mouth 2 (two) times daily.   senna 8.6 MG tablet Commonly known as:  SENOKOT Take 2 tablets by mouth 2 (two) times daily as needed for constipation.   tamsulosin 0.4 MG Caps capsule Commonly known as:  FLOMAX Take 1 capsule (0.4 mg total) by mouth daily.      No Known Allergies Contact information for after-discharge care    Destination    [facility] Preferred SNF .   Service:  Skilled Nursing Contact information: [place]  [id number] [phone]               The results of significant diagnostics from this hospitalization (including imaging, microbiology, ancillary and laboratory) are listed below for reference.    Significant Diagnostic Studies: Dg Chest 2 View  Result Date: 06/29/2018 CLINICAL DATA:  Left-sided weakness and fatigue EXAM: CHEST - 2 VIEW COMPARISON:  06/25/2018, 06/02/2018, 05/24/2018 FINDINGS: Left-sided pacing device as before. Post sternotomy changes. Small bilateral pleural effusions without significant change. Stable enlarged cardiomediastinal silhouette with mild vascular congestion and edema. No pneumothorax. Skin fold artifact over the right chest. IMPRESSION: 1. Similar appearance of small bilateral pleural effusions, cardiomegaly, and mild interstitial edema. Electronically Signed   By: Donavan Foil M.D.   On: 06/29/2018 23:23   Ct Head Wo Contrast  Result Date: 06/29/2018 CLINICAL DATA:  Recent lethargy. EXAM: CT HEAD WITHOUT CONTRAST TECHNIQUE: Contiguous axial images were obtained from the base of the skull through the vertex without  intravenous contrast. COMPARISON:  06/25/2018 and 02/23/2018 as well as neck CT 02/23/2018 FINDINGS: Brain: Examination demonstrates mild age related atrophic change and chronic ischemic microvascular disease. Ventricles and cisterns are otherwise within normal. Old lacune infarct over the left lentiform nucleus and right perisylvian region. No evidence of mass, mass effect, shift of midline structures or acute hemorrhage. No evidence of acute infarction. Vascular: No hyperdense vessel or unexpected calcification. Skull: Normal. Negative for fracture or focal lesion. Sinuses/Orbits: Orbits are normal. There is minimal opacification over the ethmoid air cells. Mastoid air cells are clear. Other: Well-defined oval mass superficial to the anterior aspect of the left parotid gland abutting the skin surface unchanged and felt to be dermatologic in nature by previous neck CT. IMPRESSION: No acute findings. Age related atrophic change and chronic ischemic microvascular disease. Minimal chronic sinus inflammatory change. Stable subcutaneous mass just superficial to the anterior aspect of the left parotid gland. Electronically Signed   By: Marin Olp M.D.   On: 06/29/2018 14:46   Ct Head Wo Contrast  Result Date: 06/25/2018 CLINICAL DATA:  Patient with involuntary tremors EXAM: CT HEAD WITHOUT CONTRAST TECHNIQUE: Contiguous axial images were obtained from the base of the skull through the vertex without intravenous contrast. COMPARISON:  Brain CT 02/23/2018 FINDINGS: Brain: Exam limited secondary to motion artifact. Ventricles and sulci are prominent compatible with atrophy. Periventricular and subcortical white matter hypodensity compatible with chronic microvascular ischemic changes. No evidence for acute cortically based infarct, intracranial hemorrhage, mass lesion or mass-effect. Old left basal ganglia lacunar infarct. Vascular: No hyperdense vessel or unexpected calcification. Skull: Intact. Sinuses/Orbits:  Paranasal sinuses are well aerated. Mastoid air cells are unremarkable. Other: Re demonstrated 2.4 cm mass anterior to the left parotid gland (image 2; series 4). On prior exam this was favored to represent dermal appendage cyst. IMPRESSION: 1. No acute intracranial process. Atrophy and chronic microvascular ischemic changes. Electronically Signed   By: Lovey Newcomer M.D.   On: 06/25/2018 21:58   US Renal  Result Date: 07/01/2018 CLINICAL DATA:  Hematuria EXAM: RENAL / URINARY TRACT ULTRASOUND COMPLETE COMPARISON:  Limited CT images through the upper abdomen from 07/17/2006 FINDINGS: Right Kidney: Length: 8.7 cm. Echogenicity within normal limits. No hydronephrosis. A 1.8 by 1.6 by 1.8 cm hypoechoic lesion of the right kidney lower pole has accentuated through transmission characteristic of a cyst, but may have some faint internal echoes. Left Kidney: Length: 8.4 cm. Echogenicity within normal limits. No mass or hydronephrosis visualized. Bladder: Appears normal for degree of bladder distention. IMPRESSION:  1. Normal renal echogenicity. No hydronephrosis or definite renal calculi observed. The cause of the patient's hematuria is not identified. 2. 1.8 cm hypoechoic lesion of the right kidney lower pole has accentuated through transmission typical for a cyst. Electronically Signed   By: Van Clines M.D.   On: 07/01/2018 20:28   Dg Chest Port 1 View  Result Date: 07/02/2018 CLINICAL DATA:  CHF EXAM: PORTABLE CHEST 1 VIEW COMPARISON:  06/29/2018 chest radiograph. FINDINGS: Intact sternotomy wires. Stable 2 lead left subclavian pacemaker. Stable cardiomediastinal silhouette with borderline mild cardiomegaly. No pneumothorax. Small bilateral pleural effusions, stable. No overt pulmonary edema. Stable bibasilar scarring versus atelectasis. IMPRESSION: Stable chest radiograph with borderline mild cardiomegaly, no overt pulmonary edema and small bilateral pleural effusions with mild bibasilar scarring versus  atelectasis. Electronically Signed   By: Ilona Sorrel M.D.   On: 07/02/2018 09:15   Dg Chest Port 1 View  Result Date: 06/25/2018 CLINICAL DATA:  Patient with tremors EXAM: PORTABLE CHEST 1 VIEW COMPARISON:  Chest radiograph 06/02/2018 FINDINGS: Multi lead pacer apparatus overlies the left hemithorax, leads are stable in position. Monitoring leads overlie the patient. Stable cardiomegaly status post median sternotomy. Small right pleural effusion with heterogeneous opacities right lung base. Bilateral interstitial opacities, right greater than left. No pneumothorax. Thoracic spine degenerative changes. IMPRESSION: Small right pleural effusion with underlying opacities which may represent atelectasis or infection. Asymmetric right-greater-than-left interstitial opacities may represent edema. Electronically Signed   By: Lovey Newcomer M.D.   On: 06/25/2018 21:31    Microbiology: Recent Results (from the past 240 hour(s))  Urine culture     Status: None   Collection Time: 06/29/18 11:55 AM  Result Value Ref Range Status   Specimen Description   Final    URINE, CATHETERIZED Performed at [facility] [address], [place], [place] [address]    Special Requests   Final    NONE Performed at [facility], [place] [address] [place], [place] [address]    Culture   Final    NO GROWTH Performed at [facility], [place] [address], [place], [place] [address]    Report Status 06/30/2018 FINAL  Final  Urine Culture     Status: Abnormal   Collection Time: 07/02/18  1:10 PM  Result Value Ref Range Status   Specimen Description URINE, RANDOM  Final   Special Requests   Final    NONE Performed at [facility], [place] [address], [place], [place] [address]    Culture 80,000 COLONIES/mL PROTEUS MIRABILIS (A)  Final   Report Status 07/04/2018 FINAL  Final   Organism ID, Bacteria PROTEUS MIRABILIS (A)  Final      Susceptibility   Proteus mirabilis - MIC*     AMPICILLIN <=2 SENSITIVE Sensitive     CEFAZOLIN 8 SENSITIVE Sensitive     CEFTRIAXONE <=1 SENSITIVE Sensitive     CIPROFLOXACIN >=4 RESISTANT Resistant     GENTAMICIN <=1 SENSITIVE Sensitive     IMIPENEM 8 INTERMEDIATE Intermediate     NITROFURANTOIN 128 RESISTANT Resistant     TRIMETH/SULFA >=320 RESISTANT Resistant     AMPICILLIN/SULBACTAM <=2 SENSITIVE Sensitive     PIP/TAZO <=4 SENSITIVE Sensitive     * 80,000 COLONIES/mL PROTEUS MIRABILIS     Labs: Basic Metabolic Panel: Recent Labs  Lab 07/01/18 0350 07/02/18 0410 07/03/18 0630 07/04/18 0438 07/06/18 0442  NA 141 143 144 143 141  K 3.4* 3.6 4.0 3.9 4.2  CL 112*  113* 115* 112* 110  CO2 22 21* 23 24 23   GLUCOSE 113* 101* 97 113* 85  BUN 35* 34* 27* 24* 22  CREATININE 1.64* 1.42* 1.48* 1.21 1.11  CALCIUM 8.1* 8.3* 8.1* 8.2* 8.4*   Liver Function Tests: Recent Labs  Lab 06/29/18 1215  AST 31  ALT 11  ALKPHOS 64  BILITOT 1.1  PROT 7.6  ALBUMIN 3.8   No results for input(s): LIPASE, AMYLASE in the last 168 hours. Recent Labs  Lab 06/30/18 0204  AMMONIA 18   CBC: Recent Labs  Lab 07/01/18 0350 07/02/18 0410 07/03/18 0630 07/04/18 0438 07/06/18 0442  WBC 12.9* 6.5 4.6 4.6 4.7  NEUTROABS 11.7* 5.0 2.7 2.6 2.7  HGB 9.5* 9.2* 8.6* 9.5* 9.5*  HCT 29.6* 28.9* 26.1* 29.2* 28.7*  MCV 98.0 99.7 98.1 97.3 96.3  PLT 117* 103* 107* 121* 136*   Cardiac Enzymes: No results for input(s): CKTOTAL, CKMB, CKMBINDEX, TROPONINI in the last 168 hours. BNP: BNP (last 3 results) Recent Labs    03/14/18 1023 05/24/18 1712 06/02/18 1628  BNP 500.7* 1,210.4* 627.9*    ProBNP (last 3 results) No results for input(s): PROBNP in the last 8760 hours.  CBG: Recent Labs  Lab 06/29/18 1158 06/29/18 1350 06/29/18 1542 06/29/18 2102 07/02/18 2031  GLUCAP 89 74 97 86 114*       Signed:  [facility] Pager: [phone] 07/06/2018, 11:00 AM

## 2018-07-06 NOTE — Plan of Care (Signed)
Adequate for discharge.

## 2018-07-06 NOTE — Progress Notes (Signed)
Discharge to: [facility] Anticipated discharge date: 07/06/18 Family notified: Yes, daughter by phone Transportation by: [facility]  Report #: [phone], Room 306A  CSW signing off.  Laveda Abbe LCSW [phone]

## 2018-07-06 NOTE — Progress Notes (Signed)
Occupational Therapy Treatment Patient Details Name: John Peck MRN: [id number] DOB: 1924/09/08 Today's Date: 07/06/2018    History of present illness 84 y.o. male with a history of hypertension, hyperlipidemia, stroke, A. Fib(not on anticoagulation due to GI bleed) who presents with progressive confusion and myoclonus.   OT comments  Pt is progressing toward OT goals. Pt demo improvement in functional activity tolerance, participation/indepndence in UB/LB ADLs and functional mobility. Min A provided for UB and mod A for LB. Continue OT per POC. D/c to SNF remains appropriate.    Follow Up Recommendations  SNF    Equipment Recommendations  None recommended by OT    Recommendations for Other Services PT consult;Speech consult    Precautions / Restrictions Precautions Precautions: Fall Precaution Comments: myoclonus BLE Restrictions Weight Bearing Restrictions: No       Mobility Bed Mobility Overal bed mobility: Needs Assistance Bed Mobility: Supine to Sit     Supine to sit: Min guard;HOB elevated     General bed mobility comments: vc for sequencing and body mechanics, min A for lifting A  Transfers Overall transfer level: Needs assistance Equipment used: Rolling walker (2 wheeled) Transfers: Sit to/from [facility] Sit to Stand: Min assist Stand pivot transfers: Min assist       General transfer comment: min A for balance and lifting assistance    Balance Overall balance assessment: Needs assistance Sitting-balance support: Feet supported;Single extremity supported Sitting balance-Leahy Scale: Fair     Standing balance support: Bilateral upper extremity supported;During functional activity Standing balance-Leahy Scale: Poor Standing balance comment: min A and heavy reliance on RW                           ADL either performed or assessed with clinical judgement   ADL Overall ADL's : Needs assistance/impaired      Grooming: Oral care;Set up;Cueing for sequencing;Sitting   Upper Body Bathing: Minimal assistance;Sitting Upper Body Bathing Details (indicate cue type and reason): min A for thoroughness Lower Body Bathing: Moderate assistance;Sit to/from stand   Upper Body Dressing : Minimal assistance;Sitting   Lower Body Dressing: Sit to/from stand;Moderate assistance               Functional mobility during ADLs: Minimal assistance;Rolling walker General ADL Comments: min A for sit to stand and stand pivot to recliner     Vision       Perception     Praxis      Cognition Arousal/Alertness: Awake/alert Behavior During Therapy: WFL for tasks assessed/performed Overall Cognitive Status: No family/caregiver present to determine baseline cognitive functioning Area of Impairment: Safety/judgement                     Memory: Decreased recall of precautions;Decreased short-term memory Following Commands: Follows one step commands consistently Safety/Judgement: Decreased awareness of safety              Exercises     Shoulder Instructions       General Comments      Pertinent Vitals/ Pain       Pain Assessment: Faces Faces Pain Scale: Hurts a little bit Pain Location: L heel Pain Descriptors / Indicators: Grimacing Pain Intervention(s): Monitored during session;Repositioned  Home Living  Prior Functioning/Environment              Frequency  Min 2X/week        Progress Toward Goals  OT Goals(current goals can now be found in the care plan section)  Progress towards OT goals: Progressing toward goals     Plan Discharge plan remains appropriate    Co-evaluation                 AM-PAC PT "6 Clicks" Daily Activity     Outcome Measure   Help from another person eating meals?: A Little Help from another person taking care of personal grooming?: A Little Help from another person  toileting, which includes using toliet, bedpan, or urinal?: A Little Help from another person bathing (including washing, rinsing, drying)?: A Little Help from another person to put on and taking off regular upper body clothing?: A Little Help from another person to put on and taking off regular lower body clothing?: A Lot 6 Click Score: 17    End of Session Equipment Utilized During Treatment: Gait belt;Rolling walker  OT Visit Diagnosis: Unsteadiness on feet (R26.81);Pain;Muscle weakness (generalized) (M62.81);Other abnormalities of gait and mobility (R26.89);Other symptoms and signs involving cognitive function Pain - Right/Left: Right Pain - part of body: Ankle and joints of foot   Activity Tolerance Patient tolerated treatment well   Patient Left in chair;with call bell/phone within reach;with chair alarm set   Nurse Communication Other (comment)(request for breathing treatment from pt)        Time: 1000-1031 OT Time Calculation (min): 31 min  Charges: OT General Charges $OT Visit: 1 Visit OT Treatments $Self Care/Home Management : 23-37 mins   Curtis Sites OTR/L 07/06/2018, 10:43 AM

## 2018-07-06 NOTE — Care Management Obs Status (Signed)
[facility] NOTIFICATION   Patient Details  Name: MATTIAS WALMSLEY MRN: [id number] Date of Birth: Jan 09, 1924   Medicare Observation Status Notification Given:  Yes    Pollie Friar, RN 07/06/2018, 8:31 AM

## 2018-07-06 NOTE — NC FL2 (Signed)
[place] LEVEL OF CARE SCREENING TOOL     IDENTIFICATION  Patient Name: John Peck Birthdate: 16-Apr-1924 Sex: male Admission Date (Current Location): 06/29/2018  [facility] and [place] Number:  Herbalist and Address:  [facility] [place], [place] [address], [place], [place] [address]      Provider Number: [id number]  Attending Physician Name and Address:  Isaac Bliss, [place] Name and Phone Number:       Current Level of Care: Hospital Recommended Level of Care: [place] Prior Approval Number:    Date Approved/Denied:   PASRR Number:    Discharge Plan: SNF    Current Diagnoses: Patient Active Problem List   Diagnosis Date Noted  . TIA (transient ischemic attack) 06/29/2018  . Pressure injury of skin 05/25/2018  . Pleural effusion 05/24/2018  . Acute respiratory failure with hypoxia ([place]) 05/24/2018  . Acute on chronic combined systolic and diastolic CHF (congestive heart failure) ([place]) 03/14/2018  . COPD clinical dx/ no pfts on record 03/14/2018  . H/O: GI bleed 03/14/2018  . CKD (chronic kidney disease), stage III ([place]) 03/14/2018  . Paroxysmal atrial fibrillation ([place]) 03/14/2018  . Hypothyroidism 03/14/2018  . Carotid stenosis, right 03/14/2018  . Facial droop 02/22/2018  . Pancytopenia ([place]) 02/22/2018  . Generalized weakness   . Hypertensive heart disease   . Hyperlipidemia   . Stented coronary artery   . Coronary artery disease involving native coronary artery of native heart with unstable angina pectoris ([place])   . Reactive airway disease 11/27/2015  . CAD in native artery 09/03/2015  . Weakness 05/09/2015  . Hyperlipidemia LDL goal <70 02/28/2015  . Atherosclerosis of lower extremity with claudication ([place]) 07/17/2014  . PAF (paroxysmal atrial fibrillation) ([place]) 03/24/2013  . Lower extremity edema 03/24/2013  . Sinoatrial node dysfunction ([place]) 08/17/2012  . History of iron  deficiency anemia 09/18/2011  . Essential hypertension 07/07/2009  . INTERMEDIATE CORONARY SYNDROME 07/07/2009  . S/P CABG x 2 '89. RCA stent'04. last cath Jan 2013 07/07/2009  . PACEMAKER, PERMANENT- St Jude Sept 2009 07/07/2009  . Sick sinus syndrome ([place]) 07/10/2008  . Pacemaker 07/10/2008    Orientation RESPIRATION BLADDER Height & Weight     Self, Time, Situation  O2(see DC Summary) Incontinent Weight: 65.3 kg Height:  5\' 9"  (175.3 cm)  BEHAVIORAL SYMPTOMS/MOOD NEUROLOGICAL BOWEL NUTRITION STATUS      Continent Diet(heart healthy)  AMBULATORY STATUS COMMUNICATION OF NEEDS Skin   Limited Assist Verbally Normal                       Personal Care Assistance Level of Assistance  Bathing, Feeding, Dressing Bathing Assistance: Limited assistance Feeding assistance: Limited assistance Dressing Assistance: Limited assistance     Functional Limitations Info  Sight, Hearing, Speech Sight Info: Adequate Hearing Info: Adequate Speech Info: Adequate    SPECIAL CARE FACTORS FREQUENCY                       Contractures Contractures Info: Not present    Additional Factors Info  Code Status, Allergies Code Status Info: DNR Allergies Info: NKA           Current Medications (07/06/2018):  This is the current hospital active medication list Current Facility-Administered Medications  Medication Dose Route Frequency Provider Last Rate Last Dose  . acetaminophen (TYLENOL) tablet 650 mg  650 mg Oral Q4H PRN Cristal Ford, DO   [phone]  mg at 07/06/18 3532   Or  . acetaminophen (TYLENOL) solution 650 mg  650 mg Per Tube Q4H PRN Cristal Ford, DO       Or  . acetaminophen (TYLENOL) suppository 650 mg  650 mg Rectal Q4H PRN Cristal Ford, DO      . albuterol (PROVENTIL) (2.5 MG/3ML) 0.083% nebulizer solution 2.5 mg  2.5 mg Nebulization Q4H PRN Cristal Ford, DO   2.5 mg at 07/05/18 1251  . aspirin chewable tablet 81 mg  81 mg Oral Daily Cristal Ford, DO   81  mg at 07/06/18 9924  . atorvastatin (LIPITOR) tablet 40 mg  40 mg Oral Daily Alma Friendly, MD   40 mg at 07/06/18 0858  . carvedilol (COREG) tablet 12.5 mg  12.5 mg Oral BID WC Alma Friendly, MD   12.5 mg at 07/06/18 0859  . cefdinir (OMNICEF) capsule 300 mg  300 mg Oral Q12H Alma Friendly, MD      . clopidogrel (PLAVIX) tablet 75 mg  75 mg Oral Daily Cristal Ford, DO   75 mg at 07/06/18 0858  . ezetimibe (ZETIA) tablet 10 mg  10 mg Oral Daily Cristal Ford, DO   10 mg at 07/06/18 0857  . isosorbide mononitrate (IMDUR) 24 hr tablet 90 mg  90 mg Oral Daily Alma Friendly, MD   90 mg at 07/06/18 0858  . latanoprost (XALATAN) 0.005 % ophthalmic solution 1 drop  1 drop Both Eyes Once per day on Mon Thu Ree Kida, [place], DO   1 drop at 07/04/18 1003  . levothyroxine (SYNTHROID, LEVOTHROID) tablet 125 mcg  125 mcg Oral QAC breakfast Cristal Ford, DO   125 mcg at 07/06/18 2683  . nitroGLYCERIN (NITROSTAT) SL tablet 0.4 mg  0.4 mg Sublingual Q5 min PRN Alma Friendly, MD      . pantoprazole (PROTONIX) EC tablet 40 mg  40 mg Oral Daily Cristal Ford, DO   40 mg at 07/06/18 0859  . polyethylene glycol (MIRALAX / GLYCOLAX) packet 17 g  17 g Oral Daily Alma Friendly, MD   17 g at 07/06/18 0857  . ranolazine (RANEXA) 12 hr tablet 1,000 mg  1,000 mg Oral BID Alma Friendly, MD   1,000 mg at 07/06/18 0900  . senna (SENOKOT) tablet 17.2 mg  2 tablet Oral BID Alma Friendly, MD   17.2 mg at 07/06/18 0859  . tamsulosin (FLOMAX) capsule 0.4 mg  0.4 mg Oral Daily Cristal Ford, DO   0.4 mg at 07/06/18 4196     Discharge Medications: Please see discharge summary for a list of discharge medications.  Relevant Imaging Results:  Relevant Lab Results:   Additional Information SS# [id number]  Pollie Friar, RN

## 2018-07-07 DIAGNOSIS — I6529 Occlusion and stenosis of unspecified carotid artery: Secondary | ICD-10-CM | POA: Diagnosis not present

## 2018-07-07 DIAGNOSIS — M79673 Pain in unspecified foot: Secondary | ICD-10-CM | POA: Diagnosis not present

## 2018-07-07 DIAGNOSIS — N390 Urinary tract infection, site not specified: Secondary | ICD-10-CM | POA: Diagnosis not present

## 2018-07-07 DIAGNOSIS — I509 Heart failure, unspecified: Secondary | ICD-10-CM | POA: Diagnosis not present

## 2018-07-08 DIAGNOSIS — R4182 Altered mental status, unspecified: Secondary | ICD-10-CM | POA: Diagnosis not present

## 2018-07-08 DIAGNOSIS — G2581 Restless legs syndrome: Secondary | ICD-10-CM | POA: Diagnosis not present

## 2018-07-08 DIAGNOSIS — R531 Weakness: Secondary | ICD-10-CM | POA: Diagnosis not present

## 2018-07-08 DIAGNOSIS — I509 Heart failure, unspecified: Secondary | ICD-10-CM | POA: Diagnosis not present

## 2018-07-12 DIAGNOSIS — D649 Anemia, unspecified: Secondary | ICD-10-CM | POA: Diagnosis not present

## 2018-07-13 DIAGNOSIS — J449 Chronic obstructive pulmonary disease, unspecified: Secondary | ICD-10-CM | POA: Diagnosis not present

## 2018-07-13 DIAGNOSIS — R269 Unspecified abnormalities of gait and mobility: Secondary | ICD-10-CM | POA: Diagnosis not present

## 2018-07-13 DIAGNOSIS — G4751 Confusional arousals: Secondary | ICD-10-CM | POA: Diagnosis not present

## 2018-07-13 DIAGNOSIS — R300 Dysuria: Secondary | ICD-10-CM | POA: Diagnosis not present

## 2018-07-13 DIAGNOSIS — R410 Disorientation, unspecified: Secondary | ICD-10-CM | POA: Diagnosis not present

## 2018-07-13 DIAGNOSIS — M6281 Muscle weakness (generalized): Secondary | ICD-10-CM | POA: Diagnosis not present

## 2018-07-15 DIAGNOSIS — G2581 Restless legs syndrome: Secondary | ICD-10-CM | POA: Diagnosis not present

## 2018-07-15 DIAGNOSIS — D649 Anemia, unspecified: Secondary | ICD-10-CM | POA: Diagnosis not present

## 2018-07-15 DIAGNOSIS — R443 Hallucinations, unspecified: Secondary | ICD-10-CM | POA: Diagnosis not present

## 2018-07-16 DIAGNOSIS — M6281 Muscle weakness (generalized): Secondary | ICD-10-CM | POA: Diagnosis not present

## 2018-07-16 DIAGNOSIS — N183 Chronic kidney disease, stage 3 (moderate): Secondary | ICD-10-CM | POA: Diagnosis not present

## 2018-07-16 DIAGNOSIS — R498 Other voice and resonance disorders: Secondary | ICD-10-CM | POA: Diagnosis not present

## 2018-07-16 DIAGNOSIS — R2689 Other abnormalities of gait and mobility: Secondary | ICD-10-CM | POA: Diagnosis not present

## 2018-07-16 DIAGNOSIS — J90 Pleural effusion, not elsewhere classified: Secondary | ICD-10-CM | POA: Diagnosis not present

## 2018-07-16 DIAGNOSIS — R1312 Dysphagia, oropharyngeal phase: Secondary | ICD-10-CM | POA: Diagnosis not present

## 2018-07-16 DIAGNOSIS — R41841 Cognitive communication deficit: Secondary | ICD-10-CM | POA: Diagnosis not present

## 2018-07-18 DIAGNOSIS — R69 Illness, unspecified: Secondary | ICD-10-CM | POA: Diagnosis not present

## 2018-07-20 DIAGNOSIS — R69 Illness, unspecified: Secondary | ICD-10-CM | POA: Diagnosis not present

## 2018-07-20 DIAGNOSIS — R410 Disorientation, unspecified: Secondary | ICD-10-CM | POA: Diagnosis not present

## 2018-07-20 DIAGNOSIS — D649 Anemia, unspecified: Secondary | ICD-10-CM | POA: Diagnosis not present

## 2018-07-20 DIAGNOSIS — F419 Anxiety disorder, unspecified: Secondary | ICD-10-CM | POA: Diagnosis not present

## 2018-07-22 DIAGNOSIS — R410 Disorientation, unspecified: Secondary | ICD-10-CM | POA: Diagnosis not present

## 2018-07-25 DIAGNOSIS — N189 Chronic kidney disease, unspecified: Secondary | ICD-10-CM | POA: Diagnosis not present

## 2018-07-25 DIAGNOSIS — R410 Disorientation, unspecified: Secondary | ICD-10-CM | POA: Diagnosis not present

## 2018-07-25 DIAGNOSIS — K5900 Constipation, unspecified: Secondary | ICD-10-CM | POA: Diagnosis not present

## 2018-07-25 DIAGNOSIS — R300 Dysuria: Secondary | ICD-10-CM | POA: Diagnosis not present

## 2018-07-26 DIAGNOSIS — J90 Pleural effusion, not elsewhere classified: Secondary | ICD-10-CM | POA: Diagnosis not present

## 2018-07-26 DIAGNOSIS — R0602 Shortness of breath: Secondary | ICD-10-CM | POA: Diagnosis not present

## 2018-07-26 DIAGNOSIS — R05 Cough: Secondary | ICD-10-CM | POA: Diagnosis not present

## 2018-07-26 DIAGNOSIS — J449 Chronic obstructive pulmonary disease, unspecified: Secondary | ICD-10-CM | POA: Diagnosis not present

## 2018-07-26 DIAGNOSIS — D631 Anemia in chronic kidney disease: Secondary | ICD-10-CM | POA: Diagnosis not present

## 2018-07-26 DIAGNOSIS — N390 Urinary tract infection, site not specified: Secondary | ICD-10-CM | POA: Diagnosis not present

## 2018-07-26 DIAGNOSIS — I509 Heart failure, unspecified: Secondary | ICD-10-CM | POA: Diagnosis not present

## 2018-07-26 DIAGNOSIS — D649 Anemia, unspecified: Secondary | ICD-10-CM | POA: Diagnosis not present

## 2018-07-26 DIAGNOSIS — Z79899 Other long term (current) drug therapy: Secondary | ICD-10-CM | POA: Diagnosis not present

## 2018-07-27 DIAGNOSIS — N183 Chronic kidney disease, stage 3 (moderate): Secondary | ICD-10-CM | POA: Diagnosis not present

## 2018-07-27 DIAGNOSIS — R410 Disorientation, unspecified: Secondary | ICD-10-CM | POA: Diagnosis not present

## 2018-07-27 DIAGNOSIS — R0600 Dyspnea, unspecified: Secondary | ICD-10-CM | POA: Diagnosis not present

## 2018-07-29 ENCOUNTER — Inpatient Hospital Stay (HOSPITAL_COMMUNITY)
Admission: EM | Admit: 2018-07-29 | Discharge: 2018-08-02 | DRG: 280 | Disposition: A | Discharge status: Skilled Nursing Facility | Payer: Medicare HMO | Attending: Family Medicine | Admitting: Family Medicine

## 2018-07-29 ENCOUNTER — Emergency Department (HOSPITAL_COMMUNITY): Payer: Medicare HMO

## 2018-07-29 DIAGNOSIS — I130 Hypertensive heart and chronic kidney disease with heart failure and stage 1 through stage 4 chronic kidney disease, or unspecified chronic kidney disease: Secondary | ICD-10-CM | POA: Diagnosis not present

## 2018-07-29 DIAGNOSIS — J9601 Acute respiratory failure with hypoxia: Secondary | ICD-10-CM | POA: Diagnosis not present

## 2018-07-29 DIAGNOSIS — Z862 Personal history of diseases of the blood and blood-forming organs and certain disorders involving the immune mechanism: Secondary | ICD-10-CM

## 2018-07-29 DIAGNOSIS — R1310 Dysphagia, unspecified: Secondary | ICD-10-CM

## 2018-07-29 DIAGNOSIS — J918 Pleural effusion in other conditions classified elsewhere: Secondary | ICD-10-CM | POA: Diagnosis present

## 2018-07-29 DIAGNOSIS — Z7951 Long term (current) use of inhaled steroids: Secondary | ICD-10-CM

## 2018-07-29 DIAGNOSIS — Z9889 Other specified postprocedural states: Secondary | ICD-10-CM

## 2018-07-29 DIAGNOSIS — R410 Disorientation, unspecified: Secondary | ICD-10-CM | POA: Diagnosis not present

## 2018-07-29 DIAGNOSIS — R0789 Other chest pain: Secondary | ICD-10-CM | POA: Diagnosis not present

## 2018-07-29 DIAGNOSIS — I495 Sick sinus syndrome: Secondary | ICD-10-CM | POA: Diagnosis present

## 2018-07-29 DIAGNOSIS — N183 Chronic kidney disease, stage 3 unspecified: Secondary | ICD-10-CM | POA: Diagnosis present

## 2018-07-29 DIAGNOSIS — R627 Adult failure to thrive: Secondary | ICD-10-CM | POA: Diagnosis present

## 2018-07-29 DIAGNOSIS — Z87891 Personal history of nicotine dependence: Secondary | ICD-10-CM

## 2018-07-29 DIAGNOSIS — I119 Hypertensive heart disease without heart failure: Secondary | ICD-10-CM | POA: Diagnosis present

## 2018-07-29 DIAGNOSIS — N179 Acute kidney failure, unspecified: Secondary | ICD-10-CM | POA: Diagnosis present

## 2018-07-29 DIAGNOSIS — I2510 Atherosclerotic heart disease of native coronary artery without angina pectoris: Secondary | ICD-10-CM | POA: Diagnosis not present

## 2018-07-29 DIAGNOSIS — R0781 Pleurodynia: Secondary | ICD-10-CM

## 2018-07-29 DIAGNOSIS — Z950 Presence of cardiac pacemaker: Secondary | ICD-10-CM | POA: Diagnosis not present

## 2018-07-29 DIAGNOSIS — I480 Paroxysmal atrial fibrillation: Secondary | ICD-10-CM | POA: Diagnosis not present

## 2018-07-29 DIAGNOSIS — R10A1 Flank pain, right side: Secondary | ICD-10-CM

## 2018-07-29 DIAGNOSIS — R0602 Shortness of breath: Secondary | ICD-10-CM

## 2018-07-29 DIAGNOSIS — F0390 Unspecified dementia without behavioral disturbance: Secondary | ICD-10-CM | POA: Diagnosis present

## 2018-07-29 DIAGNOSIS — Z7902 Long term (current) use of antithrombotics/antiplatelets: Secondary | ICD-10-CM

## 2018-07-29 DIAGNOSIS — I509 Heart failure, unspecified: Secondary | ICD-10-CM | POA: Diagnosis not present

## 2018-07-29 DIAGNOSIS — D509 Iron deficiency anemia, unspecified: Secondary | ICD-10-CM | POA: Diagnosis present

## 2018-07-29 DIAGNOSIS — I10 Essential (primary) hypertension: Secondary | ICD-10-CM | POA: Diagnosis not present

## 2018-07-29 DIAGNOSIS — I214 Non-ST elevation (NSTEMI) myocardial infarction: Secondary | ICD-10-CM | POA: Diagnosis not present

## 2018-07-29 DIAGNOSIS — Z79899 Other long term (current) drug therapy: Secondary | ICD-10-CM

## 2018-07-29 DIAGNOSIS — J449 Chronic obstructive pulmonary disease, unspecified: Secondary | ICD-10-CM | POA: Diagnosis present

## 2018-07-29 DIAGNOSIS — R404 Transient alteration of awareness: Secondary | ICD-10-CM | POA: Diagnosis not present

## 2018-07-29 DIAGNOSIS — E039 Hypothyroidism, unspecified: Secondary | ICD-10-CM | POA: Diagnosis present

## 2018-07-29 DIAGNOSIS — E785 Hyperlipidemia, unspecified: Secondary | ICD-10-CM | POA: Diagnosis present

## 2018-07-29 DIAGNOSIS — R69 Illness, unspecified: Secondary | ICD-10-CM | POA: Diagnosis not present

## 2018-07-29 DIAGNOSIS — Z515 Encounter for palliative care: Secondary | ICD-10-CM

## 2018-07-29 DIAGNOSIS — I5043 Acute on chronic combined systolic (congestive) and diastolic (congestive) heart failure: Secondary | ICD-10-CM | POA: Diagnosis not present

## 2018-07-29 DIAGNOSIS — J90 Pleural effusion, not elsewhere classified: Secondary | ICD-10-CM | POA: Diagnosis present

## 2018-07-29 DIAGNOSIS — Z66 Do not resuscitate: Secondary | ICD-10-CM | POA: Diagnosis present

## 2018-07-29 DIAGNOSIS — Z951 Presence of aortocoronary bypass graft: Secondary | ICD-10-CM | POA: Diagnosis not present

## 2018-07-29 DIAGNOSIS — Z7189 Other specified counseling: Secondary | ICD-10-CM | POA: Diagnosis not present

## 2018-07-29 DIAGNOSIS — Z8673 Personal history of transient ischemic attack (TIA), and cerebral infarction without residual deficits: Secondary | ICD-10-CM

## 2018-07-29 DIAGNOSIS — R109 Unspecified abdominal pain: Secondary | ICD-10-CM | POA: Diagnosis not present

## 2018-07-29 DIAGNOSIS — R079 Chest pain, unspecified: Secondary | ICD-10-CM | POA: Diagnosis not present

## 2018-07-29 DIAGNOSIS — Z7982 Long term (current) use of aspirin: Secondary | ICD-10-CM

## 2018-07-29 DIAGNOSIS — R0902 Hypoxemia: Secondary | ICD-10-CM | POA: Diagnosis not present

## 2018-07-29 DIAGNOSIS — I447 Left bundle-branch block, unspecified: Secondary | ICD-10-CM | POA: Diagnosis present

## 2018-07-29 LAB — URINALYSIS, ROUTINE W REFLEX MICROSCOPIC
Bilirubin Urine: NEGATIVE
GLUCOSE, UA: NEGATIVE mg/dL
Hgb urine dipstick: NEGATIVE
KETONES UR: NEGATIVE mg/dL
LEUKOCYTES UA: NEGATIVE
NITRITE: NEGATIVE
PH: 5 (ref 5.0–8.0)
Protein, ur: NEGATIVE mg/dL
SPECIFIC GRAVITY, URINE: 1.011 (ref 1.005–1.030)

## 2018-07-29 LAB — BASIC METABOLIC PANEL
ANION GAP: 7 (ref 5–15)
BUN: 25 mg/dL — ABNORMAL HIGH (ref 8–23)
CALCIUM: 8.5 mg/dL — AB (ref 8.9–10.3)
CO2: 27 mmol/L (ref 22–32)
Chloride: 106 mmol/L (ref 98–111)
Creatinine, Ser: 1.31 mg/dL — ABNORMAL HIGH (ref 0.61–1.24)
GFR calc Af Amer: 53 mL/min — ABNORMAL LOW (ref >60)
GFR, EST NON AFRICAN AMERICAN: 46 mL/min — AB (ref >60)
GLUCOSE: 104 mg/dL — AB (ref 70–99)
Potassium: 4.1 mmol/L (ref 3.5–5.1)
Sodium: 140 mmol/L (ref 135–145)

## 2018-07-29 LAB — CBC WITH DIFFERENTIAL/PLATELET
ABS IMMATURE GRANULOCYTES: 0 10*3/uL (ref 0.0–0.1)
BASOS ABS: 0 10*3/uL (ref 0.0–0.1)
BASOS PCT: 0 %
EOS PCT: 7 %
Eosinophils Absolute: 0.3 10*3/uL (ref 0.0–0.7)
HCT: 28.6 % — ABNORMAL LOW (ref 39.0–52.0)
Hemoglobin: 9.1 g/dL — ABNORMAL LOW (ref 13.0–17.0)
Immature Granulocytes: 1 %
Lymphocytes Relative: 21 %
Lymphs Abs: 0.9 10*3/uL (ref 0.7–4.0)
MCH: 31.8 pg (ref 26.0–34.0)
MCHC: 31.8 g/dL (ref 30.0–36.0)
MCV: 100 fL (ref 78.0–100.0)
MONO ABS: 0.7 10*3/uL (ref 0.1–1.0)
MONOS PCT: 16 %
NEUTROS ABS: 2.4 10*3/uL (ref 1.7–7.7)
Neutrophils Relative %: 55 %
PLATELETS: 127 10*3/uL — AB (ref 150–400)
RBC: 2.86 MIL/uL — ABNORMAL LOW (ref 4.22–5.81)
RDW: 15 % (ref 11.5–15.5)
WBC: 4.3 10*3/uL (ref 4.0–10.5)

## 2018-07-29 LAB — I-STAT TROPONIN, ED: TROPONIN I, POC: 0.18 ng/mL — AB (ref 0.00–0.08)

## 2018-07-29 LAB — HEPARIN LEVEL (UNFRACTIONATED): Heparin Unfractionated: 0.47 IU/mL (ref 0.30–0.70)

## 2018-07-29 LAB — TROPONIN I
Troponin I: 0.25 ng/mL (ref <0.03)
Troponin I: 0.22 ng/mL (ref <0.03)

## 2018-07-29 LAB — MRSA PCR SCREENING: MRSA by PCR: NEGATIVE

## 2018-07-29 LAB — POC OCCULT BLOOD, ED: Fecal Occult Bld: NEGATIVE

## 2018-07-29 LAB — BRAIN NATRIURETIC PEPTIDE: B NATRIURETIC PEPTIDE 5: 360.4 pg/mL — AB (ref 0.0–100.0)

## 2018-07-29 MED ORDER — PANTOPRAZOLE SODIUM 40 MG PO TBEC
40.0000 mg | DELAYED_RELEASE_TABLET | Freq: Every day | ORAL | Status: DC
  Administered 2018-07-29 – 2018-08-02 (×5): 40 mg via ORAL
  Filled 2018-07-29 (×5): qty 1

## 2018-07-29 MED ORDER — IPRATROPIUM-ALBUTEROL 0.5-2.5 (3) MG/3ML IN SOLN
3.0000 mL | Freq: Three times a day (TID) | RESPIRATORY_TRACT | Status: DC
  Administered 2018-07-29 – 2018-08-02 (×10): 3 mL via RESPIRATORY_TRACT
  Filled 2018-07-29 (×11): qty 3

## 2018-07-29 MED ORDER — ACETAMINOPHEN 325 MG PO TABS: 650.0000 mg | ORAL_TABLET | Freq: Three times a day (TID) | ORAL | Status: DC

## 2018-07-29 MED ORDER — MIRTAZAPINE 7.5 MG PO TABS: 7.5000 mg | ORAL_TABLET | Freq: Every day | ORAL | Status: DC

## 2018-07-29 MED ORDER — HEPARIN (PORCINE) IN NACL 100-0.45 UNIT/ML-% IJ SOLN
750.0000 [IU]/h | INTRAMUSCULAR | Status: DC
  Administered 2018-07-29 – 2018-07-30 (×2): 750 [IU]/h via INTRAVENOUS
  Filled 2018-07-29 (×2): qty 250

## 2018-07-29 MED ORDER — ASPIRIN EC 81 MG PO TBEC
81.0000 mg | DELAYED_RELEASE_TABLET | Freq: Every day | ORAL | Status: DC
  Administered 2018-07-30 – 2018-08-02 (×4): 81 mg via ORAL
  Filled 2018-07-29 (×4): qty 1

## 2018-07-29 MED ORDER — ONDANSETRON HCL 4 MG/2ML IJ SOLN: 4.0000 mg | Freq: Four times a day (QID) | INTRAMUSCULAR | Status: DC | PRN

## 2018-07-29 MED ORDER — ALBUTEROL SULFATE (2.5 MG/3ML) 0.083% IN NEBU
5.0000 mg | INHALATION_SOLUTION | Freq: Once | RESPIRATORY_TRACT | Status: AC
  Administered 2018-07-29: 5 mg via RESPIRATORY_TRACT
  Filled 2018-07-29: qty 6

## 2018-07-29 MED ORDER — HEPARIN (PORCINE) IN NACL 100-0.45 UNIT/ML-% IJ SOLN: 10.0000 [IU]/kg/h | INTRAMUSCULAR | Status: DC

## 2018-07-29 MED ORDER — NITROGLYCERIN 0.4 MG SL SUBL: 0.4000 mg | SUBLINGUAL_TABLET | SUBLINGUAL | Status: DC | PRN

## 2018-07-29 MED ORDER — FERROUS SULFATE 325 (65 FE) MG PO TABS
325.0000 mg | ORAL_TABLET | Freq: Two times a day (BID) | ORAL | Status: DC
  Administered 2018-07-29 – 2018-08-02 (×8): 325 mg via ORAL
  Filled 2018-07-29 (×8): qty 1

## 2018-07-29 MED ORDER — POLYETHYLENE GLYCOL 3350 17 G PO PACK
17.0000 g | PACK | Freq: Every day | ORAL | Status: DC
  Administered 2018-08-02: 17 g via ORAL
  Filled 2018-07-29 (×4): qty 1

## 2018-07-29 MED ORDER — SENNA 8.6 MG PO TABS: 2.0000 | ORAL_TABLET | Freq: Two times a day (BID) | ORAL | Status: DC | PRN

## 2018-07-29 MED ORDER — FUROSEMIDE 40 MG PO TABS
60.0000 mg | ORAL_TABLET | Freq: Two times a day (BID) | ORAL | Status: DC
  Administered 2018-07-29 – 2018-07-31 (×4): 60 mg via ORAL
  Filled 2018-07-29 (×4): qty 1

## 2018-07-29 MED ORDER — LEVOTHYROXINE SODIUM 125 MCG PO TABS
125.0000 ug | ORAL_TABLET | Freq: Every day | ORAL | Status: DC
  Administered 2018-07-30 – 2018-08-02 (×4): 125 ug via ORAL
  Filled 2018-07-29 (×4): qty 1

## 2018-07-29 MED ORDER — ALBUTEROL SULFATE (2.5 MG/3ML) 0.083% IN NEBU
2.5000 mg | INHALATION_SOLUTION | RESPIRATORY_TRACT | Status: DC | PRN
  Administered 2018-07-30 – 2018-07-31 (×2): 2.5 mg via RESPIRATORY_TRACT
  Filled 2018-07-29 (×2): qty 3

## 2018-07-29 MED ORDER — HEPARIN BOLUS VIA INFUSION
3750.0000 [IU] | Freq: Once | INTRAVENOUS | Status: AC
  Administered 2018-07-29: 3750 [IU] via INTRAVENOUS
  Filled 2018-07-29: qty 3750

## 2018-07-29 MED ORDER — ACETAMINOPHEN 325 MG PO TABS
650.0000 mg | ORAL_TABLET | ORAL | Status: DC | PRN
  Administered 2018-07-29 – 2018-07-31 (×5): 650 mg via ORAL
  Filled 2018-07-29 (×5): qty 2

## 2018-07-29 MED ORDER — CLOPIDOGREL BISULFATE 75 MG PO TABS
75.0000 mg | ORAL_TABLET | Freq: Every day | ORAL | Status: DC
  Administered 2018-07-30 – 2018-08-02 (×4): 75 mg via ORAL
  Filled 2018-07-29 (×4): qty 1

## 2018-07-29 MED ORDER — RANOLAZINE ER 500 MG PO TB12
1000.0000 mg | ORAL_TABLET | Freq: Two times a day (BID) | ORAL | Status: DC
  Administered 2018-07-29 – 2018-08-02 (×6): 1000 mg via ORAL
  Filled 2018-07-29 (×9): qty 2

## 2018-07-29 MED ORDER — EZETIMIBE 10 MG PO TABS
10.0000 mg | ORAL_TABLET | Freq: Every day | ORAL | Status: DC
  Administered 2018-07-30 – 2018-08-02 (×4): 10 mg via ORAL
  Filled 2018-07-29 (×4): qty 1

## 2018-07-29 MED ORDER — RESOURCE THICKENUP CLEAR PO POWD
ORAL | Status: DC | PRN
  Administered 2018-07-29: 21:00:00 via ORAL
  Filled 2018-07-29: qty 125

## 2018-07-29 MED ORDER — IPRATROPIUM-ALBUTEROL 0.5-2.5 (3) MG/3ML IN SOLN: 3.0000 mL | Freq: Three times a day (TID) | RESPIRATORY_TRACT | Status: DC

## 2018-07-29 MED ORDER — LATANOPROST 0.005 % OP SOLN
1.0000 [drp] | OPHTHALMIC | Status: DC
  Administered 2018-08-01: 1 [drp] via OPHTHALMIC
  Filled 2018-07-29: qty 2.5

## 2018-07-29 MED ORDER — ISOSORBIDE MONONITRATE ER 60 MG PO TB24
90.0000 mg | ORAL_TABLET | Freq: Every day | ORAL | Status: DC
  Administered 2018-07-30: 90 mg via ORAL
  Filled 2018-07-29 (×2): qty 1

## 2018-07-29 MED ORDER — CARVEDILOL 12.5 MG PO TABS
12.5000 mg | ORAL_TABLET | Freq: Two times a day (BID) | ORAL | Status: DC
  Administered 2018-07-29 – 2018-08-02 (×8): 12.5 mg via ORAL
  Filled 2018-07-29 (×8): qty 1

## 2018-07-29 MED ORDER — ATORVASTATIN CALCIUM 40 MG PO TABS
40.0000 mg | ORAL_TABLET | Freq: Every evening | ORAL | Status: DC
  Administered 2018-07-29 – 2018-08-01 (×4): 40 mg via ORAL
  Filled 2018-07-29 (×4): qty 1

## 2018-07-29 MED ORDER — ALPRAZOLAM 0.25 MG PO TABS: 0.2500 mg | ORAL_TABLET | Freq: Two times a day (BID) | ORAL | Status: DC | PRN

## 2018-07-29 MED ORDER — TAMSULOSIN HCL 0.4 MG PO CAPS
0.4000 mg | ORAL_CAPSULE | Freq: Every day | ORAL | Status: DC
  Administered 2018-07-30 – 2018-08-02 (×4): 0.4 mg via ORAL
  Filled 2018-07-29 (×4): qty 1

## 2018-07-29 MED ORDER — IPRATROPIUM BROMIDE 0.02 % IN SOLN
0.5000 mg | Freq: Once | RESPIRATORY_TRACT | Status: AC
  Administered 2018-07-29: 0.5 mg via RESPIRATORY_TRACT
  Filled 2018-07-29: qty 2.5

## 2018-07-29 NOTE — Progress Notes (Signed)
RT NOTES: Inspiratory flow measure at 40L/min. Patient is able to do MDI inhaler efficiently.

## 2018-07-29 NOTE — Consult Note (Addendum)
Cardiology Consultation:   Patient ID: John Peck; [id number]; [birth year]   Admit date: 07/29/2018 Date of Consult: 07/29/2018  Primary Care Provider: Seward Carol, MD Primary Cardiologist: Shelva Majestic, MD Primary Electrophysiologist:  Cristopher Peru, MD  Chief Complaint: chest pain, SOB  Patient Profile:   John Peck is a 84 y.o. male with a hx of CAD (s/p CABG 1989, DES to RCA 2017, DES to prox LCx 02/2017), PAF (not on anticoag due to GIB), SSS s/p St Jude PPM, HTN, CKD stage III, hypothyroidism, dementia, pleural effusion s/p thoracentesis 05/2018, iron deficiency anemia with recent pancytopenia of unknown cause, chronic combined CHF, severe R carotid artery disease, GIB, LBBB, stroke, prior liver abcess, COPD, lower extremity PAD (treated medically), PVD, HLD who is being seen today for the evaluation of cP at the request of Dr. Steffanie Dunn  History of Present Illness:   He has very complex PMH as above. Per prior notes he's not been felt to be a candidate for anticoagulation due to h/o GIB, but seems to be tolerating ASA/Plavix post PCI. At time of last cath 02/2017 it was recommended to continue DAPT for at least 1 year and preferably indefinitely. He last saw Dr. Claiborne Billings 04/2018 who increased his metoprolol due to intercurrent angina periodically requiring SL NTG. In the meantime it looks like this has been changed to carvedilol as he has been admitted multiple times in 2019. He was admitted 02/2018 with difficulty speaking, abnormal gait, left facial droop, and leg weakness.  A CT of his head showed no acute intracranial pathology. The MRI could not be done due to his pacemaker. Neurology felt the symptoms may have been due to cerebral hypoperfusion with high-grade right carotid stenosis noted on ultrasound. He was asked to f/u with vascular surgeon. He was admitted 03/2018 with HCAP. He was admitted 05/2018 with acute respiratory failure/hypoxia with R pleural effusion s/p thoracentesis,  cytology negative for malignancy. In 06/2018 he was here for metabolic encephalopathy and UTI. He has lost approximately 30lbs in the last few months and has very poor appetite. His daughter (by phone) says they started him on Remeron in hopes of improving appetite but he had bad dreams with it so this was stopped. He also has had pancytopenia of unclear etiology.  The patient is sleepy but arousable, so history also supplemented by daughter. Apparently the patient had recurrent CP this AM at his SNF and increased SOB so CXR was obtained showing recurrent R pleural effusion. He reports he received SL NTG for his chest pain and it resolved. He last had to take this 2 weeks ago (has to take from time to time in accordance with prior history). No evidence of bleeding, edema. He has incredible difficulty walking and has to wear boots and has had skin tears. Denies syncope. He states he would not wish for any more procedures on his heart.  Past Medical History:  Diagnosis Date  . Anemia   . Arthritis   . CAD (coronary artery disease)    a. CABG '89; b. PCI '04; c. 11/2015 Cath/PCI: LM 20ost, LAD 178m, LCX 30p, OM2 40, RCA 30p, 10p ISR, 90d (3.0x12 Synergy DES), AM 90, VG->OM2 known to be 100, LIMA->LAD not injected, patent in 2015.  . Carotid stenosis, right   . Chronic combined systolic and diastolic CHF (congestive heart failure) ([place])   . CKD (chronic kidney disease), stage III ([place])   . H/O: GI bleed    REMOTE HISTORY  . History of  COPD   . Hyperlipidemia   . Hypertensive heart disease   . LBBB (left bundle branch block)   . Pacemaker Sept 2009   St Jude  . PAF (paroxysmal atrial fibrillation) ([facility])    a. not on anticoag due to prior GIB.  . Sick sinus syndrome [facility]) Sept 2009   ST Jude PTVDP  . Stroke [facility])     Past Surgical History:  Procedure Laterality Date  . CARDIAC CATHETERIZATION  11/2011   EF 50%; significant native CAD w/80% stenosis of the LAD after 2nd giagonal vessel and  septal perforating artery w/total occlusion of the mid left anterior descendiung.; 60-70% ostiasl stenosis in the circumflex vessel followed by 40% proximal stenosis and 70-80% distal circumflex stenosis;   . CARDIAC CATHETERIZATION N/A 11/29/2015   Procedure: Left Heart Cath and Cors/Grafts Angiography;  Surgeon: Burnell Blanks, MD;  Location: [place] [facility];  Service: Cardiovascular;  Laterality: N/A;  . CARDIAC CATHETERIZATION N/A 11/29/2015   Procedure: Coronary Stent Intervention;  Surgeon: Burnell Blanks, MD;  Location: [place] [facility];  Service: Cardiovascular;  Laterality: N/A;  . CATARACT EXTRACTION  2004  . CORONARY ANGIOGRAPHY N/A 02/25/2017   Procedure: Coronary Angiography;  Surgeon: Belva Crome, MD;  Location: [place] [facility];  Service: Cardiovascular;  Laterality: N/A;  . CORONARY ANGIOPLASTY WITH STENT PLACEMENT  2004   RCA  . CORONARY ARTERY BYPASS GRAFT  1989   had LIMA to his LAD, a vein to the circumflex. In 2004 underwent stenting to his right coronary artery.  . CORONARY STENT INTERVENTION N/A 02/24/2017   Procedure: Coronary Stent Intervention;  Surgeon: Wellington Hampshire, MD;  Location: [place] [facility];  Service: Cardiovascular;  Laterality: N/A;  cfx  . HEMORRHOID SURGERY    . INSERT / REPLACE / REMOVE PACEMAKER  07/17/08   DUAL-CHAMBER; PPM-ST.JUDE MEDNET  . IR THORACENTESIS ASP PLEURAL SPACE W/IMG GUIDE  05/25/2018  . LEFT HEART CATH AND CORS/GRAFTS ANGIOGRAPHY N/A 02/24/2017   Procedure: Left Heart Cath and Cors/Grafts Angiography;  Surgeon: Wellington Hampshire, MD;  Location: [place] [facility];  Service: Cardiovascular;  Laterality: N/A;  . LEFT HEART CATHETERIZATION WITH CORONARY/GRAFT ANGIOGRAM N/A 12/10/2011   Procedure: LEFT HEART CATHETERIZATION WITH Beatrix Fetters;  Surgeon: Troy Sine, MD;  Location[facility] CATH LAB;  Service: Cardiovascular;  Laterality: N/A;  . LEFT HEART CATHETERIZATION WITH CORONARY/GRAFT ANGIOGRAM N/A  01/26/2014   Procedure: LEFT HEART CATHETERIZATION WITH Beatrix Fetters;  Surgeon: Troy Sine, MD;  Location: [facility] CATH LAB;  Service: Cardiovascular;  Laterality: N/A;  . PPM GENERATOR CHANGEOUT N/A 12/15/2017   Procedure: PPM GENERATOR CHANGEOUT;  Surgeon: Evans Lance, MD;  Location: [place] CV LAB;  Service: Cardiovascular;  Laterality: N/A;  . PROSTATECTOMY       Inpatient Medications: Scheduled Meds: . aspirin EC  81 mg Oral Daily  . atorvastatin  40 mg Oral QPM  . carvedilol  12.5 mg Oral BID WC  . [START ON 07/30/2018] clopidogrel  75 mg Oral Daily  . ezetimibe  10 mg Oral Daily  . ferrous sulfate  325 mg Oral BID WC  . furosemide  60 mg Oral BID  . ipratropium-albuterol  3 mL Nebulization TID  . isosorbide mononitrate  90 mg Oral Daily  . [START ON 08/01/2018] latanoprost  1 drop Both Eyes Once per day on Mon Thu  . [START ON 07/30/2018] levothyroxine  125 mcg Oral QAC breakfast  . pantoprazole  40 mg  Oral Daily  . polyethylene glycol  17 g Oral Daily  . ranolazine  1,000 mg Oral BID  . [START ON 07/30/2018] tamsulosin  0.4 mg Oral Daily   Continuous Infusions: . heparin 750 Units/hr (07/29/18 1524)   PRN Meds: acetaminophen, albuterol, ALPRAZolam, nitroGLYCERIN, ondansetron (ZOFRAN) IV, senna  Home Meds: Prior to Admission medications   Medication Sig Start Date End Date Taking? Authorizing Provider  acetaminophen (TYLENOL) 325 MG tablet Take 650 mg by mouth 3 (three) times daily.   Yes [provider]  albuterol (PROVENTIL HFA;VENTOLIN HFA) 108 (90 Base) MCG/ACT inhaler Inhale 2 puffs into the lungs every 4 (four) hours as needed for wheezing or shortness of breath (or coughing). 05/27/18  Yes Emokpae, Courage, MD  ALPRAZolam (XANAX) 0.25 MG tablet Take 1 tablet (0.25 mg total) by mouth 2 (two) times daily as needed for anxiety. 05/27/18  Yes Emokpae, Courage, MD  aspirin 81 MG tablet Take 81 mg by mouth daily.    Yes [provider]    atorvastatin (LIPITOR) 40 MG tablet Take 1 tablet (40 mg total) by mouth daily. Patient taking differently: Take 40 mg by mouth every evening.  09/18/15  Yes Troy Sine, MD  carvedilol (COREG) 12.5 MG tablet Take 1 tablet (12.5 mg total) by mouth 2 (two) times daily with a meal. 05/27/18  Yes Emokpae, Courage, MD  clopidogrel (PLAVIX) 75 MG tablet Take 1 tablet (75 mg total) by mouth daily. 05/27/18  Yes Emokpae, Courage, MD  ezetimibe (ZETIA) 10 MG tablet Take 10 mg by mouth daily.   Yes [provider]  ferrous sulfate 325 (65 FE) MG tablet Take 1 tablet (325 mg total) by mouth 2 (two) times daily with a meal. 05/27/18  Yes Emokpae, Courage, MD  furosemide (LASIX) 20 MG tablet Take 2 tablets (40 mg total) by mouth 2 (two) times daily. Patient taking differently: Take 30 mg by mouth 2 (two) times daily.  05/27/18 05/27/19 Yes Emokpae, Courage, MD  ipratropium-albuterol (DUONEB) 0.5-2.5 (3) MG/3ML SOLN Take 3 mLs by nebulization 2 (two) times daily. Patient taking differently: Take 3 mLs by nebulization 3 (three) times daily.  02/24/18  Yes Mikhail, Velta Addison, DO  isosorbide mononitrate (IMDUR) 30 MG 24 hr tablet Take 3 tablets (90 mg total) by mouth daily. 05/28/18  Yes Emokpae, Courage, MD  latanoprost (XALATAN) 0.005 % ophthalmic solution Place 1 drop into both eyes 2 (two) times a week. On mondays and wednesdays 11/07/15  Yes [provider]  levothyroxine (SYNTHROID, LEVOTHROID) 125 MCG tablet TAKE 1 TABLET BY MOUTH EVERY DAY BEFORE BREAKFAST Patient taking differently: Take 125 mcg by mouth daily before breakfast.  04/05/18  Yes Troy Sine, MD  nitroGLYCERIN (NITROSTAT) 0.4 MG SL tablet Place 1 tablet (0.4 mg total) under the tongue every 5 (five) minutes as needed for chest pain. 07/06/18  Yes Isaac Bliss, Rayford Halsted, MD  pantoprazole (PROTONIX) 40 MG tablet Take 1 tablet (40 mg total) by mouth daily. 05/28/18  Yes Emokpae, Courage, MD  polyethylene glycol (MIRALAX /  GLYCOLAX) packet Take 17 g by mouth daily. 05/27/18  Yes Emokpae, Courage, MD  ranolazine (RANEXA) 1000 MG SR tablet Take 1 tablet (1,000 mg total) by mouth 2 (two) times daily. 05/27/18  Yes Roxan Hockey, MD  senna (SENOKOT) 8.6 MG tablet Take 2 tablets by mouth 2 (two) times daily as needed for constipation.   Yes [provider]  tamsulosin (FLOMAX) 0.4 MG CAPS capsule Take 1 capsule (0.4 mg total) by  mouth daily. 05/27/18  Yes Roxan Hockey, MD    Allergies:   No Known Allergies  Social History:   Social History   Socioeconomic History  . Marital status: Married    Spouse name: Not on file  . Number of children: 5  . Years of education: Not on file  . Highest education level: Not on file  Occupational History  . Occupation: RETIRED    Employer: RETIRED    Comment: [employer]  . Financial resource strain: Not hard at all  . Food insecurity:    Worry: Never true    Inability: Never true  . Transportation needs:    Medical: No    Non-medical: No  Tobacco Use  . Smoking status: Former Smoker    Packs/day: 0.75    Years: 65.00    Pack years: 48.75    Types: Cigarettes    Last attempt to quit: 11/09/1997    Years since quitting: 20.7  . Smokeless tobacco: Former Systems developer    Quit date: 10/29/2008  Substance and Sexual Activity  . Alcohol use: No  . Drug use: No  . Sexual activity: Not Currently  Lifestyle  . Physical activity:    Days per week: 0 days    Minutes per session: 0 min  . Stress: Not on file  Relationships  . Social connections:    Talks on phone: Never    Gets together: More than three times a week    Attends religious service: Never    Active member of club or organization: Yes    Attends meetings of clubs or organizations: Never    Relationship status: Married  . Intimate partner violence:    Fear of current or ex partner: Not on file    Emotionally abused: Not on file    Physically abused: Not on file    Forced sexual  activity: Not on file  Other Topics Concern  . Not on file  Social History Narrative  . Not on file    Family History:   The patient's family history includes Diabetes in his father; Heart attack in his brother; Heart disease in his brother and sister; Stroke in his brother and sister. There is no history of Hypertension.  ROS:  Please see the history of present illness.  All other ROS reviewed and negative.     Physical Exam/Data:   Vitals:   07/29/18 1130 07/29/18 1300 07/29/18 1415 07/29/18 1421  BP: 119/62  116/69   Pulse: 61  63   Resp: (!) 26  (!) 22   Temp: 97.8 F (36.6 C)     TempSrc: Oral     SpO2: 99%  96% 99%  Weight:  63 kg    Height:  5\' 9"  (1.753 m)     No intake or output data in the 24 hours ending 07/29/18 1721 Filed Weights   07/29/18 1300  Weight: 63 kg   Body mass index is 20.53 kg/m.  General: Frail elderly AAM in no acute distress. Head: Normocephalic, atraumatic, sclera non-icteric, no xanthomas, nares are without discharge.  Neck: JVD not elevated. R carotid bruit Lungs: Diminished BS R lung base 1/2 way up, otherwise no w/r/rhonchi. Breathing is unlabored. Heart: RRR with S1 S2. No murmurs, rubs, or gallops appreciated. Abdomen: Soft, non-tender, non-distended with normoactive bowel sounds. No hepatomegaly. No rebound/guarding. No obvious abdominal masses. Msk:  Generalized atrophy noted Extremities: No clubbing or cyanosis. No edema.  Neuro: Alert and oriented X  3 when aroused but generally sleepy. No focal deficit. Moves all extremities spontaneously. Psych:  Responds to questions appropriately with a normal affect.  EKG:  The EKG was personally reviewed and demonstrates ?atrial fib, LBBB - however, telemetry reveals AV pacing  Laboratory Data:  Chemistry Recent Labs  Lab 07/29/18 1119  NA 140  K 4.1  CL 106  CO2 27  GLUCOSE 104*  BUN 25*  CREATININE 1.31*  CALCIUM 8.5*  GFRNONAA 46*  GFRAA 53*  ANIONGAP 7    No results for  input(s): PROT, ALBUMIN, AST, ALT, ALKPHOS, BILITOT in the last 168 hours. Hematology Recent Labs  Lab 07/29/18 1119  WBC 4.3  RBC 2.86*  HGB 9.1*  HCT 28.6*  MCV 100.0  MCH 31.8  MCHC 31.8  RDW 15.0  PLT 127*   Cardiac Enzymes Recent Labs  Lab 07/29/18 1552  TROPONINI 0.25*    Recent Labs  Lab 07/29/18 1139  TROPIPOC 0.18*    BNP Recent Labs  Lab 07/29/18 1119  BNP 360.4*    DDimer No results for input(s): DDIMER in the last 168 hours.  Radiology/Studies:  Dg Chest 2 View  Result Date: 07/29/2018 CLINICAL DATA:  Shortness of breath and chest pain EXAM: CHEST - 2 VIEW COMPARISON:  July 02, 2018 FINDINGS: There is a pleural effusion on the right with atelectatic change in the right mid and lower lung zones. There is no frank consolidation. Heart is upper normal in size with pulmonary vascularity normal. Pacemaker leads are attached to the right atrium and right ventricle. Patient is status post internal mammary bypass grafting. No adenopathy. No bone lesions. IMPRESSION: Moderate right pleural effusion with right mid and lower lung zone atelectatic change. No frank consolidation. Stable cardiac silhouette. Pacemaker leads attached to right atrium and right ventricle. No pneumothorax. Electronically Signed   By: Lowella Grip III M.D.   On: 07/29/2018 12:05    Assessment and Plan:   1. Chest pain, with chronic stable angina and CAD as above, possible NSTEMI - difficult to know if this is true ACS or demand ischemia from recurrent pleural effusion. This patient has had failure to thrive over the past year with recurrent readmissions for issues that would not be prevented from any time of heart procedure. Given his 30lb weight loss, pancytopenia, and recurrent pleural effusion I would be concerned for underlying malignancy. He has used SL NTG at home PRN chest pain and we would suggest to continue to do so. He is pain free at present time. BP is a little soft for  aggressive med titration, so would continue home regimen and follow. OK to use heparin for now, but with caution given anemia/thrombocytopenia. Will follow enzyme trend but the patient expressed a desire to avoid invasive procedures on his heart. He is already on DAPT, BB, and Ranexa. He was on amlodipine at one point but looks like he's had issues with BP.  2. Acute respiratory failure with hypoxia and reaccumulation of R pleural effusion - with ongoing weight loss, concern this may represent more than heart failure (effusions are more likely bilateral in heart failure, and fairly low BNP is out of proportion to size of effusion). Further eval per primary team. Would suggest primary team consider a goals of care discussion with patient/family to determine how aggressive to be with regard to procedures and workup.  3. Chronic combined CHF - does not appear acutely decompensated. See above re: pleural effusion.  4. PAF - not on anticoag due  to h/o GIB. Follow on telemetry. Last device interrogation 38% AT/AF so likely paroxysmal. Tele shows AV pacing. Rate is stable.   For questions or updates, please contact [place] Please consult [email or web address] for contact info under Cardiology/STEMI.    Signed, Charlie Pitter, PA-C  07/29/2018 5:21 PM   I have personally seen and examined this patient with Melina Copa, PA-C. I agree with the assessment and plan as outlined above. Mr. Ostrand is a pleasant 84 yo male with many chronic medical issues. He has known CAD s/p CABG. He also has dementia and is a DNR. He resides in a nursing home and has been confined to bed for several months. He has lost 30 lbs over the past few months due to having no appetite. He is admitted with dyspnea and one episode of chest pain. His chest pain is resolved. He has a unilateral pleural effusion. His troponin is mildly elevated. His failure to thrive is concerning for malignancy. It is not clear if his presentation is also c/w an  acute coronary syndrome.  EKG reviewed by me and shows atrial fib with AV pacing Labs reviewed.  My exam: Emaciated elderly male. NAD. Lungs: Crackles right base. TI:RWERX irreg, systolic murmur. Ext: no edema, bandages on wounds on both legs. Abd: soft, NT  Plan: Mr. Donoghue has known CAD and is presenting with dyspnea and chest pain. This may represent ACS but given his dementia, advanced age, immobility, renal insufficiency, anemia and recent weight loss, he is a poor candidate for invasive cardiac procedures. He has expressed a desire to avoid further invasive cardiac procedures. OK to use heparin tonight. Follow H/H closely. Cycle troponin for risk stratification but unlikely to consider cardiac cath even if troponin becomes significantly elevated. He is in atrial fib but has not been on anticoagulation due to prior GI bleeding.  Consider palliative care consult to define goals of care.   We will follow up tomorrow.   Lauree Chandler 07/29/2018 6:55 PM

## 2018-07-29 NOTE — Progress Notes (Signed)
Patient troponin increased x2 since Admission, initial complaint of chest pain, on heparin drip, patient placement called and I "John Peck" asked that patient be reassigned due to increased level of care needed.

## 2018-07-29 NOTE — Progress Notes (Signed)
rec'd report from ED pt. To go to room 2C07

## 2018-07-29 NOTE — H&P (Signed)
History and Physical    John Peck OIZ:[id number] DOB: Jun 17, 1924 DOA: 07/29/2018  PCP: Seward Carol, MD Consultants:  Cardiology Patient coming from: [facility]  Chief Complaint: Chest pain  HPI: John Peck is a 84 y.o. male with medical history significant for CAD status post CABG times 12/29/1987 with stenting of the RCA in 2004, A. fib and sick sinus syndrome status post permanent pacemaker 2009, essential hypertension, chronic kidney disease stage III, hypothyroidism, hyperlipidemia, dementia who was brought to the ED from his skilled nursing facility this morning after patient complained of chest pain and shortness of breath.  He has had chronic shortness of breath for several years but this is gotten worse over the last 2 or 3 days.  He states that he had chest pain that bothered him while he was trying to sleep last night.  It was described as substernal, nonradiating, 8 out of 10 at its worst.  It was relieved with one nitroglycerin.  He reports no lower extremity edema, no orthopnea, no fevers chills or cough.  Last cardiac cath in April 2018 showed right coronary was unchanged and the native left coronary demonstrated wide patency of the stent that had been recently implanted.  He was found to have a moderate R sided pleural effusion in July 2019. This was thought to be 2/2 CHF exacerbation. He had 1.5 L drained at the time. Fluid studies were consistent with transudate; no organisms on gram stain, neg culture.   ED Course: He was awake but sleepy and a poor historian.  He is accompanied by his wife and daughter. Temperature was 97.8.  Pulse rate 61.  Respiratory rate 26.  Blood pressure 119/62.  He was initially hypoxic with an O2 sat of 88% on room air.  It was then 100% on 3 L and by the time of my interview he was satting 98% on room air.  EKG showed sinus rhythm, left bundle branch block.  Initial troponin was 0.18.  BNP was 360.4.  Chest x-ray showed a moderate right  pleural effusion with right mid and lower lung zone atelectasis, no frank consolidation, stable cardiac silhouette.  He was started on heparin per ACS protocol.  Review of Systems: As per HPI; otherwise review of systems reviewed and negative.    Past Medical History:  Diagnosis Date  . Anemia   . Arthritis   . CAD (coronary artery disease)    a. CABG '89; b. PCI '04; c. 11/2015 Cath/PCI: LM 20ost, LAD 19m, LCX 30p, OM2 40, RCA 30p, 10p ISR, 90d (3.0x12 Synergy DES), AM 90, VG->OM2 known to be 100, LIMA->LAD not injected, patent in 2015.  . Carotid stenosis, right   . Chronic combined systolic and diastolic CHF (congestive heart failure) ([place])   . CKD (chronic kidney disease), stage III ([place])   . H/O: GI bleed    REMOTE HISTORY  . History of COPD   . Hyperlipidemia   . Hypertensive heart disease   . LBBB (left bundle branch block)   . Pacemaker Sept 2009   St Jude  . PAF (paroxysmal atrial fibrillation) (HCC)    a. not on anticoag due to prior GIB.  . Sick sinus syndrome [facility]) Sept 2009   ST Jude PTVDP  . Stroke [facility])     Past Surgical History:  Procedure Laterality Date  . CARDIAC CATHETERIZATION  11/2011   EF 50%; significant native CAD w/80% stenosis of the LAD after 2nd giagonal vessel and septal perforating artery w/total  occlusion of the mid left anterior descendiung.; 60-70% ostiasl stenosis in the circumflex vessel followed by 40% proximal stenosis and 70-80% distal circumflex stenosis;   . CARDIAC CATHETERIZATION N/A 11/29/2015   Procedure: Left Heart Cath and Cors/Grafts Angiography;  Surgeon: Burnell Blanks, MD;  Location: [place] CV LAB;  Service: Cardiovascular;  Laterality: N/A;  . CARDIAC CATHETERIZATION N/A 11/29/2015   Procedure: Coronary Stent Intervention;  Surgeon: Burnell Blanks, MD;  Location: [place] CV LAB;  Service: Cardiovascular;  Laterality: N/A;  . CATARACT EXTRACTION  2004  . CORONARY ANGIOGRAPHY N/A 02/25/2017   Procedure:  Coronary Angiography;  Surgeon: Belva Crome, MD;  Location: [place] CV LAB;  Service: Cardiovascular;  Laterality: N/A;  . CORONARY ANGIOPLASTY WITH STENT PLACEMENT  2004   RCA  . CORONARY ARTERY BYPASS GRAFT  1989   had LIMA to his LAD, a vein to the circumflex. In 2004 underwent stenting to his right coronary artery.  . CORONARY STENT INTERVENTION N/A 02/24/2017   Procedure: Coronary Stent Intervention;  Surgeon: Wellington Hampshire, MD;  Location: [place] CV LAB;  Service: Cardiovascular;  Laterality: N/A;  cfx  . HEMORRHOID SURGERY    . INSERT / REPLACE / REMOVE PACEMAKER  07/17/08   DUAL-CHAMBER; PPM-ST.JUDE MEDNET  . IR THORACENTESIS ASP PLEURAL SPACE W/IMG GUIDE  05/25/2018  . LEFT HEART CATH AND CORS/GRAFTS ANGIOGRAPHY N/A 02/24/2017   Procedure: Left Heart Cath and Cors/Grafts Angiography;  Surgeon: Wellington Hampshire, MD;  Location: [place] CV LAB;  Service: Cardiovascular;  Laterality: N/A;  . LEFT HEART CATHETERIZATION WITH CORONARY/GRAFT ANGIOGRAM N/A 12/10/2011   Procedure: LEFT HEART CATHETERIZATION WITH Beatrix Fetters;  Surgeon: Troy Sine, MD;  Location: [facility] CATH LAB;  Service: Cardiovascular;  Laterality: N/A;  . LEFT HEART CATHETERIZATION WITH CORONARY/GRAFT ANGIOGRAM N/A 01/26/2014   Procedure: LEFT HEART CATHETERIZATION WITH Beatrix Fetters;  Surgeon: Troy Sine, MD;  Location: [facility] CATH LAB;  Service: Cardiovascular;  Laterality: N/A;  . PPM GENERATOR CHANGEOUT N/A 12/15/2017   Procedure: PPM GENERATOR CHANGEOUT;  Surgeon: Evans Lance, MD;  Location: [place] CV LAB;  Service: Cardiovascular;  Laterality: N/A;  . PROSTATECTOMY      Social History   Socioeconomic History  . Marital status: Married    Spouse name: Not on file  . Number of children: 5  . Years of education: Not on file  . Highest education level: Not on file  Occupational History  . Occupation: RETIRED    Employer: RETIRED    Comment: [employer]  .  Financial resource strain: Not hard at all  . Food insecurity:    Worry: Never true    Inability: Never true  . Transportation needs:    Medical: No    Non-medical: No  Tobacco Use  . Smoking status: Former Smoker    Packs/day: 0.75    Years: 65.00    Pack years: 48.75    Types: Cigarettes    Last attempt to quit: 11/09/1997    Years since quitting: 20.7  . Smokeless tobacco: Former Systems developer    Quit date: 10/29/2008  Substance and Sexual Activity  . Alcohol use: No  . Drug use: No  . Sexual activity: Not Currently  Lifestyle  . Physical activity:    Days per week: 0 days    Minutes per session: 0 min  . Stress: Not on file  Relationships  . Social connections:    Talks on phone: Never  Gets together: More than three times a week    Attends religious service: Never    Active member of club or organization: Yes    Attends meetings of clubs or organizations: Never    Relationship status: Married  . Intimate partner violence:    Fear of current or ex partner: Not on file    Emotionally abused: Not on file    Physically abused: Not on file    Forced sexual activity: Not on file  Other Topics Concern  . Not on file  Social History Narrative  . Not on file    No Known Allergies  Family History  Problem Relation Age of Onset  . Diabetes Father   . Stroke Sister   . Heart disease Sister   . Heart disease Brother   . Heart attack Brother   . Stroke Brother   . Hypertension Neg Hx     Prior to Admission medications   Medication Sig Start Date End Date Taking? Authorizing Provider  acetaminophen (TYLENOL) 325 MG tablet Take 650 mg by mouth 3 (three) times daily.    [provider]  albuterol (PROVENTIL HFA;VENTOLIN HFA) 108 (90 Base) MCG/ACT inhaler Inhale 2 puffs into the lungs every 4 (four) hours as needed for wheezing or shortness of breath (or coughing). 05/27/18   Roxan Hockey, MD  ALPRAZolam Duanne Moron) 0.25 MG tablet Take 1 tablet (0.25 mg total) by mouth  2 (two) times daily as needed for anxiety. 05/27/18   Roxan Hockey, MD  aspirin 81 MG tablet Take 81 mg by mouth daily.     [provider]  atorvastatin (LIPITOR) 40 MG tablet Take 1 tablet (40 mg total) by mouth daily. Patient taking differently: Take 40 mg by mouth every evening.  09/18/15   Troy Sine, MD  carvedilol (COREG) 12.5 MG tablet Take 1 tablet (12.5 mg total) by mouth 2 (two) times daily with a meal. 05/27/18   Emokpae, Courage, MD  clopidogrel (PLAVIX) 75 MG tablet Take 1 tablet (75 mg total) by mouth daily. 05/27/18   Roxan Hockey, MD  ezetimibe (ZETIA) 10 MG tablet Take 10 mg by mouth daily.    [provider]  ferrous sulfate 325 (65 FE) MG tablet Take 1 tablet (325 mg total) by mouth 2 (two) times daily with a meal. 05/27/18   Emokpae, Courage, MD  furosemide (LASIX) 20 MG tablet Take 2 tablets (40 mg total) by mouth 2 (two) times daily. Patient taking differently: Take 60 mg by mouth 2 (two) times daily.  05/27/18 05/27/19  Roxan Hockey, MD  ipratropium-albuterol (DUONEB) 0.5-2.5 (3) MG/3ML SOLN Take 3 mLs by nebulization 2 (two) times daily. Patient taking differently: Take 3 mLs by nebulization 3 (three) times daily.  02/24/18   Mikhail, Velta Addison, DO  isosorbide mononitrate (IMDUR) 30 MG 24 hr tablet Take 3 tablets (90 mg total) by mouth daily. 05/28/18   Emokpae, Courage, MD  latanoprost (XALATAN) 0.005 % ophthalmic solution Place 1 drop into both eyes 2 (two) times a week. On mondays and wednesdays 11/07/15   [provider]  levothyroxine (SYNTHROID, LEVOTHROID) 125 MCG tablet TAKE 1 TABLET BY MOUTH EVERY DAY BEFORE BREAKFAST Patient taking differently: Take 125 mcg by mouth daily before breakfast.  04/05/18   Troy Sine, MD         nitroGLYCERIN (NITROSTAT) 0.4 MG SL tablet Place 1 tablet (0.4 mg total) under the tongue every 5 (five) minutes as needed for chest pain. 07/06/18   Jerilee Hoh  Everardo Beals, MD  pantoprazole (PROTONIX) 40  MG tablet Take 1 tablet (40 mg total) by mouth daily. 05/28/18   Roxan Hockey, MD  polyethylene glycol (MIRALAX / GLYCOLAX) packet Take 17 g by mouth daily. 05/27/18   Roxan Hockey, MD  ranolazine (RANEXA) 1000 MG SR tablet Take 1 tablet (1,000 mg total) by mouth 2 (two) times daily. 05/27/18   Roxan Hockey, MD  senna (SENOKOT) 8.6 MG tablet Take 2 tablets by mouth 2 (two) times daily as needed for constipation.    [provider]  tamsulosin (FLOMAX) 0.4 MG CAPS capsule Take 1 capsule (0.4 mg total) by mouth daily. 05/27/18   Roxan Hockey, MD    Physical Exam: Vitals:   07/29/18 1130 07/29/18 1300  BP: 119/62   Pulse: 61   Resp: (!) 26   Temp: 97.8 F (36.6 C)   TempSrc: Oral   SpO2: 99%   Weight:  63 kg  Height:  5\' 9"  (1.753 m)     . General: Elderly, cachectic male, appears calm and comfortable and is in NAD . Eyes:  PERRL, EOMI, normal lids, iris . ENT:  grossly normal hearing, lips & tongue, mmm; appropriate dentition . Neck:  no LAD, masses or thyromegaly; no carotid bruits . Cardiovascular:  normal rate, reg rhythm, no murmur. Marland Kitchen Respiratory: Undergoing nebulization treatment, work of breathing is slightly increased, there are some fine wheezes and upper lobes bilaterally, otherwise  no rales/rhonchi.   . Abdomen:  soft, NT, ND, NABS . Back:   grossly normal alignment, no CVAT . Skin:  no rash or induration seen on limited exam . Musculoskeletal:  grossly normal tone BUE/BLE, good ROM, no bony abnormality or obvious joint deformity . Lower extremity:  No LE edema.  Limited foot exam with no ulcerations.  2+ distal pulses. Marland Kitchen Psychiatric:  grossly normal mood and affect, he is confused at times and tangential . Neurologic:  CN 2-12 grossly intact, moves all extremities in coordinated fashion, sensation intact    Radiological Exams on Admission: Dg Chest 2 View  Result Date: 07/29/2018 CLINICAL DATA:  Shortness of breath and chest pain EXAM: CHEST - 2  VIEW COMPARISON:  July 02, 2018 FINDINGS: There is a pleural effusion on the right with atelectatic change in the right mid and lower lung zones. There is no frank consolidation. Heart is upper normal in size with pulmonary vascularity normal. Pacemaker leads are attached to the right atrium and right ventricle. Patient is status post internal mammary bypass grafting. No adenopathy. No bone lesions. IMPRESSION: Moderate right pleural effusion with right mid and lower lung zone atelectatic change. No frank consolidation. Stable cardiac silhouette. Pacemaker leads attached to right atrium and right ventricle. No pneumothorax. Electronically Signed   By: Lowella Grip III M.D.   On: 07/29/2018 12:05    EKG: Independently reviewed Date/Time:                  Friday July 29 2018 11:28:56 EDT Ventricular Rate:         63 PR Interval:                   QRS Duration: 170 QT Interval:                 480 QTC Calculation:        492 R Axis:                         -  56 Text Interpretation:       Sinus rhythm Atrial premature complex Prolonged PR interval Left bundle branch block No significant change was found Confirmed by Jola Schmidt [phone]) on 07/29/2018 1:05:18 PM   Labs on Admission: I have personally reviewed the available labs and imaging studies at the time of the admission.  Pertinent labs:  Na 140 K 4.1 Cl 106 CO2 27 Gluc 104 BUN 25 Scr 1.31 (baseline) BNP 360.4 TnI 0.18 WBC 4.3 Hgb 9.1 Plt 127 U/A clean    Assessment/Plan Principal Problem:   Chest pain Active Problems:   Essential hypertension   S/P CABG x 2 '89. RCA stent'04. last cath Jan 2013   PACEMAKER, PERMANENT- St Jude Sept 2009   History of iron deficiency anemia   PAF (paroxysmal atrial fibrillation) (HCC)   Hypertensive heart disease   Hyperlipidemia   CKD (chronic kidney disease), stage III (HCC)   Hypothyroidism   Pleural effusion   Acute respiratory failure with hypoxia (HCC)    Chest pain:  NSTEMI. Cardiology has been consulted. Admit to obs, telemetry. Will cycle troponin. Continue heparin per pharmacy. Continue plavix. NPO after MN in case of procedure. EKG in a.m. NTG prn. Cont BB and imdur, cont plavix, ASA, ranexa.  Acute respiratory failure with hypoxia; now resolved. Suspect some is due to reaccumulated R pleural effusion; he does have wheezing on exam and although has no formal dx of COPD, he likely does have this to degree. Cont duonebs prn. Off O2 now.   Chronic systolic and diastolic CHF not thought to be in exacerbation: cont home dose of lasix. Na restricted diet.  HTN: cont home carvedilol, Imdur home doses  PAF (not on chronic anticoagulation 2/2 GIB): s/p PPM. Cont carvedilol  HLD: cont lipitor, zetia  Hypothyroidism: cont levothyroxine     DVT prophylaxis: Heparin full dose Lovenox or SCDs Code Status:  DNR/DNI - confirmed with patient/family Family Communication: Wife and daughter at bedside  Disposition Plan: SNF once clinically improved Consults called: Cardiology  Admission status: It is my clinical opinion that referral for OBSERVATION is reasonable and necessary in this patient based on the above information provided. The aforementioned taken together are felt to place the patient at high risk for further clinical deterioration. However it is anticipated that the patient may be medically stable for discharge from the hospital within 24 to 48 hours.     Janora Norlander MD [facility]  If note is complete, please contact covering daytime or nighttime physician. [email or web address] Password [email or web address]  07/29/2018, 1:59 PM

## 2018-07-29 NOTE — ED Triage Notes (Signed)
Pt from [facility] via ems; 0430 pt c/o sob and cp; staff gave 1 nitro w/ relief; hx pleural effusions, possible increase; no cp or sob w/ ems; FD SpO2 88% RA; placed on 3 L O2 brought 98%; warm to touch; A and O x 4; per staff, pt has been altered; answers questions appropriately w/ ems; has pacemaker; Hx CHF, CABG, kidney disease, CAD, hypothyroidism; has MOST form; DNR  T 99.66F 128 64 HR 60 RR 16 CBG 168

## 2018-07-29 NOTE — Progress Notes (Signed)
ANTICOAGULATION CONSULT NOTE - Initial Consult  Pharmacy Consult for heparin Indication: chest pain/ACS  No Known Allergies  Patient Measurements: Height: 5\' 9"  (175.3 cm) Weight: 139 lb (63 kg) IBW/kg (Calculated) : 70.7 Heparin Dosing Weight: 63 kg  Vital Signs: Temp: 97.8 F (36.6 C) (09/20 1130) Temp Source: Oral (09/20 1130) BP: 119/62 (09/20 1130) Pulse Rate: 61 (09/20 1130)  Labs: Recent Labs    07/29/18 1119  HGB 9.1*  HCT 28.6*  PLT 127*  CREATININE 1.31*    Estimated Creatinine Clearance: 32.1 mL/min (A) (by C-G formula based on SCr of 1.31 mg/dL (H)).   Medical History: Past Medical History:  Diagnosis Date  . Anemia   . Arthritis   . CAD (coronary artery disease)    a. CABG '89; b. PCI '04; c. 11/2015 Cath/PCI: LM 20ost, LAD 153m, LCX 30p, OM2 40, RCA 30p, 10p ISR, 90d (3.0x12 Synergy DES), AM 90, VG->OM2 known to be 100, LIMA->LAD not injected, patent in 2015.  . Carotid stenosis, right   . Chronic combined systolic and diastolic CHF (congestive heart failure) ([place])   . CKD (chronic kidney disease), stage III ([place])   . H/O: GI bleed    REMOTE HISTORY  . History of COPD   . Hyperlipidemia   . Hypertensive heart disease   . LBBB (left bundle branch block)   . Pacemaker Sept 2009   St Jude  . PAF (paroxysmal atrial fibrillation) (HCC)    a. not on anticoag due to prior GIB.  . Sick sinus syndrome [facility]) Sept 2009   ST Jude PTVDP  . Stroke [facility])     Medications:  Scheduled:  . albuterol  5 mg Nebulization Once  . heparin  3,750 Units Intravenous Once  . ipratropium  0.5 mg Nebulization Once    Assessment: 89 yom with known hx of CAD, St Jude pacemaker, atrial fibrillation (not on anticoag d/t prior GIB) presenting with SOB and chest pain.   Hgb 9.1, plt 127, troponin 0.18. Scr 1.31 (CrCl ~32 mL/min). No s/sx of bleeding.   Goal of Therapy:  Heparin level 0.3-0.7 units/ml Monitor platelets by anticoagulation protocol: Yes   Plan:  Give  3750 units bolus x 1 Start heparin infusion at 750 units/hr Check anti-Xa level in 8 hours and daily while on heparin Continue to monitor H&H and platelets  Doylene Canard, PharmD Clinical Pharmacist  Pager: [phone] Phone: [phone] 07/29/2018,1:38 PM

## 2018-07-29 NOTE — Progress Notes (Signed)
ANTICOAGULATION CONSULT NOTE - Follow Up Consult  Pharmacy Consult for heparin Indication: chest pain/ACS  Labs: Recent Labs    07/29/18 1119 07/29/18 1552 07/29/18 2257  HGB 9.1*  --   --   HCT 28.6*  --   --   PLT 127*  --   --   HEPARINUNFRC  --   --  0.47  CREATININE 1.31*  --   --   TROPONINI  --  0.25*  --     Assessment/Plan:  84yo male therapeutic on heparin with initial dosing for CP. Will continue gtt at current rate and confirm stable with am labs.   Wynona Neat, PharmD, BCPS  07/29/2018,11:33 PM

## 2018-07-29 NOTE — ED Provider Notes (Signed)
[facility] EMERGENCY DEPARTMENT Provider Note   CSN: [id number] Arrival date & time: 07/29/18  1116     History   Chief Complaint Chief Complaint  Patient presents with  . Chest Pain  . Shortness of Breath    HPI LEEROY LOVINGS is a 84 y.o. male.  The history is provided by the patient, the nursing home and medical records. No language interpreter was used.     84 year old male with history of COPD, CAD, CKD, has [place] pacemaker, paroxysmal atrial fibrillation not on anticoagulant due to prior GI bleed presenting for evaluation of shortness of breath and chest pain.  Pt brought here via EMS from [facility].  Per triage note, this morning patient was complaining of having trouble breathing and chest pain.  Patient was giving 1 screen and nitro with relief.  Brought here for further care.  Was noted that patient was satting at 88% on room air and was placed on 3 L of O2 oxygen.  Facility also noticed that patient was warm to the touch.  History is difficult to obtain from patient.  Level 5 caveats.  Currently patient denies having any active pain.  Past Medical History:  Diagnosis Date  . Anemia   . Arthritis   . CAD (coronary artery disease)    a. CABG '89; b. PCI '04; c. 11/2015 Cath/PCI: LM 20ost, LAD 157m, LCX 30p, OM2 40, RCA 30p, 10p ISR, 90d (3.0x12 Synergy DES), AM 90, VG->OM2 known to be 100, LIMA->LAD not injected, patent in 2015.  . Carotid stenosis, right   . Chronic combined systolic and diastolic CHF (congestive heart failure) ([place])   . CKD (chronic kidney disease), stage III ([place])   . H/O: GI bleed    REMOTE HISTORY  . History of COPD   . Hyperlipidemia   . Hypertensive heart disease   . LBBB (left bundle branch block)   . Pacemaker Sept 2009   St Jude  . PAF (paroxysmal atrial fibrillation) (HCC)    a. not on anticoag due to prior GIB.  . Sick sinus syndrome [facility]) Sept 2009   ST Jude PTVDP  . Stroke [facility])     Patient Active  Problem List   Diagnosis Date Noted  . TIA (transient ischemic attack) 06/29/2018  . Pressure injury of skin 05/25/2018  . Pleural effusion 05/24/2018  . Acute respiratory failure with hypoxia ([place]) 05/24/2018  . Acute on chronic combined systolic and diastolic CHF (congestive heart failure) ([place]) 03/14/2018  . COPD clinical dx/ no pfts on record 03/14/2018  . H/O: GI bleed 03/14/2018  . CKD (chronic kidney disease), stage III ([place]) 03/14/2018  . Paroxysmal atrial fibrillation ([place]) 03/14/2018  . Hypothyroidism 03/14/2018  . Carotid stenosis, right 03/14/2018  . Facial droop 02/22/2018  . Pancytopenia ([place]) 02/22/2018  . Generalized weakness   . Hypertensive heart disease   . Hyperlipidemia   . Stented coronary artery   . Coronary artery disease involving native coronary artery of native heart with unstable angina pectoris ([place])   . Reactive airway disease 11/27/2015  . CAD in native artery 09/03/2015  . Weakness 05/09/2015  . Hyperlipidemia LDL goal <70 02/28/2015  . Atherosclerosis of lower extremity with claudication ([place]) 07/17/2014  . PAF (paroxysmal atrial fibrillation) ([place]) 03/24/2013  . Lower extremity edema 03/24/2013  . Sinoatrial node dysfunction ([place]) 08/17/2012  . History of iron deficiency anemia 09/18/2011  . Essential hypertension 07/07/2009  . INTERMEDIATE CORONARY SYNDROME 07/07/2009  . S/P CABG  x 2 '89. RCA stent'04. last cath Jan 2013 07/07/2009  . PACEMAKER, PERMANENT- St Jude Sept 2009 07/07/2009  . Sick sinus syndrome ([place]) 07/10/2008  . Pacemaker 07/10/2008    Past Surgical History:  Procedure Laterality Date  . CARDIAC CATHETERIZATION  11/2011   EF 50%; significant native CAD w/80% stenosis of the LAD after 2nd giagonal vessel and septal perforating artery w/total occlusion of the mid left anterior descendiung.; 60-70% ostiasl stenosis in the circumflex vessel followed by 40% proximal stenosis and 70-80% distal circumflex stenosis;   . CARDIAC  CATHETERIZATION N/A 11/29/2015   Procedure: Left Heart Cath and Cors/Grafts Angiography;  Surgeon: Burnell Blanks, MD;  Location: [facility] CV LAB;  Service: Cardiovascular;  Laterality: N/A;  . CARDIAC CATHETERIZATION N/A 11/29/2015   Procedure: Coronary Stent Intervention;  Surgeon: Burnell Blanks, MD;  Location: [facility] CV LAB;  Service: Cardiovascular;  Laterality: N/A;  . CATARACT EXTRACTION  2004  . CORONARY ANGIOGRAPHY N/A 02/25/2017   Procedure: Coronary Angiography;  Surgeon: Belva Crome, MD;  Location: [facility] CV LAB;  Service: Cardiovascular;  Laterality: N/A;  . CORONARY ANGIOPLASTY WITH STENT PLACEMENT  2004   RCA  . CORONARY ARTERY BYPASS GRAFT  1989   had LIMA to his LAD, a vein to the circumflex. In 2004 underwent stenting to his right coronary artery.  . CORONARY STENT INTERVENTION N/A 02/24/2017   Procedure: Coronary Stent Intervention;  Surgeon: Wellington Hampshire, MD;  Location: [facility] CV LAB;  Service: Cardiovascular;  Laterality: N/A;  cfx  . HEMORRHOID SURGERY    . INSERT / REPLACE / REMOVE PACEMAKER  07/17/08   DUAL-CHAMBER; PPM-ST.JUDE MEDNET  . IR THORACENTESIS ASP PLEURAL SPACE W/IMG GUIDE  05/25/2018  . LEFT HEART CATH AND CORS/GRAFTS ANGIOGRAPHY N/A 02/24/2017   Procedure: Left Heart Cath and Cors/Grafts Angiography;  Surgeon: Wellington Hampshire, MD;  Location: [facility] CV LAB;  Service: Cardiovascular;  Laterality: N/A;  . LEFT HEART CATHETERIZATION WITH CORONARY/GRAFT ANGIOGRAM N/A 12/10/2011   Procedure: LEFT HEART CATHETERIZATION WITH Beatrix Fetters;  Surgeon: Troy Sine, MD;  Location[facility] CATH LAB;  Service: Cardiovascular;  Laterality: N/A;  . LEFT HEART CATHETERIZATION WITH CORONARY/GRAFT ANGIOGRAM N/A 01/26/2014   Procedure: LEFT HEART CATHETERIZATION WITH Beatrix Fetters;  Surgeon: Troy Sine, MD;  Location: [facility] CATH LAB;  Service: Cardiovascular;  Laterality: N/A;  . PPM GENERATOR CHANGEOUT N/A 12/15/2017    Procedure: PPM GENERATOR CHANGEOUT;  Surgeon: Evans Lance, MD;  Location: [facility] CV LAB;  Service: Cardiovascular;  Laterality: N/A;  . PROSTATECTOMY          Home Medications    Prior to Admission medications   Medication Sig Start Date End Date Taking? Authorizing Provider  acetaminophen (TYLENOL) 325 MG tablet Take 650 mg by mouth 3 (three) times daily.    [provider]  albuterol (PROVENTIL HFA;VENTOLIN HFA) 108 (90 Base) MCG/ACT inhaler Inhale 2 puffs into the lungs every 4 (four) hours as needed for wheezing or shortness of breath (or coughing). 05/27/18   Roxan Hockey, MD  ALPRAZolam Duanne Moron) 0.25 MG tablet Take 1 tablet (0.25 mg total) by mouth 2 (two) times daily as needed for anxiety. 05/27/18   Roxan Hockey, MD  aspirin 81 MG tablet Take 81 mg by mouth daily.     [provider]  atorvastatin (LIPITOR) 40 MG tablet Take 1 tablet (40 mg total) by mouth daily. Patient taking differently: Take 40 mg by mouth every evening.  09/18/15  Troy Sine, MD  carvedilol (COREG) 12.5 MG tablet Take 1 tablet (12.5 mg total) by mouth 2 (two) times daily with a meal. 05/27/18   Emokpae, Courage, MD  clopidogrel (PLAVIX) 75 MG tablet Take 1 tablet (75 mg total) by mouth daily. 05/27/18   Roxan Hockey, MD  ezetimibe (ZETIA) 10 MG tablet Take 10 mg by mouth daily.    [provider]  ferrous sulfate 325 (65 FE) MG tablet Take 1 tablet (325 mg total) by mouth 2 (two) times daily with a meal. 05/27/18   Emokpae, Courage, MD  furosemide (LASIX) 20 MG tablet Take 2 tablets (40 mg total) by mouth 2 (two) times daily. Patient taking differently: Take 60 mg by mouth 2 (two) times daily.  05/27/18 05/27/19  Roxan Hockey, MD  ipratropium-albuterol (DUONEB) 0.5-2.5 (3) MG/3ML SOLN Take 3 mLs by nebulization 2 (two) times daily. Patient taking differently: Take 3 mLs by nebulization 3 (three) times daily.  02/24/18   Mikhail, Velta Addison, DO  isosorbide mononitrate  (IMDUR) 30 MG 24 hr tablet Take 3 tablets (90 mg total) by mouth daily. 05/28/18   Emokpae, Courage, MD  latanoprost (XALATAN) 0.005 % ophthalmic solution Place 1 drop into both eyes 2 (two) times a week. On mondays and wednesdays 11/07/15   [provider]  levothyroxine (SYNTHROID, LEVOTHROID) 125 MCG tablet TAKE 1 TABLET BY MOUTH EVERY DAY BEFORE BREAKFAST Patient taking differently: Take 125 mcg by mouth daily before breakfast.  04/05/18   Troy Sine, MD  mirtazapine (REMERON) 7.5 MG tablet Take 7.5 mg by mouth at bedtime.    [provider]  nitroGLYCERIN (NITROSTAT) 0.4 MG SL tablet Place 1 tablet (0.4 mg total) under the tongue every 5 (five) minutes as needed for chest pain. 07/06/18   Isaac Bliss, Rayford Halsted, MD  pantoprazole (PROTONIX) 40 MG tablet Take 1 tablet (40 mg total) by mouth daily. 05/28/18   Roxan Hockey, MD  polyethylene glycol (MIRALAX / GLYCOLAX) packet Take 17 g by mouth daily. 05/27/18   Roxan Hockey, MD  ranolazine (RANEXA) 1000 MG SR tablet Take 1 tablet (1,000 mg total) by mouth 2 (two) times daily. 05/27/18   Roxan Hockey, MD  senna (SENOKOT) 8.6 MG tablet Take 2 tablets by mouth 2 (two) times daily as needed for constipation.    [provider]  tamsulosin (FLOMAX) 0.4 MG CAPS capsule Take 1 capsule (0.4 mg total) by mouth daily. 05/27/18   Roxan Hockey, MD    Family History Family History  Problem Relation Age of Onset  . Diabetes Father   . Stroke Sister   . Heart disease Sister   . Heart disease Brother   . Heart attack Brother   . Stroke Brother   . Hypertension Neg Hx     Social History Social History   Tobacco Use  . Smoking status: Former Smoker    Packs/day: 0.75    Years: 65.00    Pack years: 48.75    Types: Cigarettes    Last attempt to quit: 11/09/1997    Years since quitting: 20.7  . Smokeless tobacco: Former Systems developer    Quit date: 10/29/2008  Substance Use Topics  . Alcohol use: No  . Drug use:  No     Allergies   Patient has no known allergies.   Review of Systems Review of Systems  Unable to perform ROS: Age     Physical Exam Updated Vital Signs There were no vitals taken for this visit.  Physical  Exam  Constitutional: He appears well-developed and well-nourished. No distress.  HENT:  Head: Atraumatic.  Eyes: Conjunctivae are normal.  Neck: Neck supple. No tracheal deviation present.  Cardiovascular: Normal rate, regular rhythm and normal pulses.  Murmur heard.  Systolic murmur is present. Pulmonary/Chest:  Decreased breath sounds with scattered wheezes and faint crackles at the left lower lung base.  Abdominal: Soft. He exhibits no distension. There is no tenderness.  Genitourinary:  Genitourinary Comments: Chaperone present on exam.  Black tarry colored stool.  No obvious mass.  Musculoskeletal: He exhibits no edema.  Neurological: He is alert.  Alert to self and place only.  Able to move all 4 extremities.  Skin: No rash noted.  Psychiatric: He has a normal mood and affect.  Nursing note and vitals reviewed.    ED Treatments / Results  Labs (all labs ordered are listed, but only abnormal results are displayed) Labs Reviewed  BASIC METABOLIC PANEL - Abnormal; Notable for the following components:      Result Value   Glucose, Bld 104 (*)    BUN 25 (*)    Creatinine, Ser 1.31 (*)    Calcium 8.5 (*)    GFR calc non Af Amer 46 (*)    GFR calc Af Amer 53 (*)    All other components within normal limits  CBC WITH DIFFERENTIAL/PLATELET - Abnormal; Notable for the following components:   RBC 2.86 (*)    Hemoglobin 9.1 (*)    HCT 28.6 (*)    Platelets 127 (*)    All other components within normal limits  BRAIN NATRIURETIC PEPTIDE - Abnormal; Notable for the following components:   B Natriuretic Peptide 360.4 (*)    All other components within normal limits  I-STAT TROPONIN, ED - Abnormal; Notable for the following components:   Troponin i, poc 0.18  (*)    All other components within normal limits  URINALYSIS, ROUTINE W REFLEX MICROSCOPIC  HEPARIN LEVEL (UNFRACTIONATED)  POC OCCULT BLOOD, ED    EKG EKG Interpretation  Date/Time:  Friday July 29 2018 11:28:56 EDT Ventricular Rate:  63 PR Interval:    QRS Duration: 170 QT Interval:  480 QTC Calculation: 492 R Axis:   -56 Text Interpretation:  Sinus rhythm Atrial premature complex Prolonged PR interval Left bundle branch block No significant change was found Confirmed by Jola Schmidt [id number]) on 07/29/2018 1:05:18 PM   Radiology Dg Chest 2 View  Result Date: 07/29/2018 CLINICAL DATA:  Shortness of breath and chest pain EXAM: CHEST - 2 VIEW COMPARISON:  July 02, 2018 FINDINGS: There is a pleural effusion on the right with atelectatic change in the right mid and lower lung zones. There is no frank consolidation. Heart is upper normal in size with pulmonary vascularity normal. Pacemaker leads are attached to the right atrium and right ventricle. Patient is status post internal mammary bypass grafting. No adenopathy. No bone lesions. IMPRESSION: Moderate right pleural effusion with right mid and lower lung zone atelectatic change. No frank consolidation. Stable cardiac silhouette. Pacemaker leads attached to right atrium and right ventricle. No pneumothorax. Electronically Signed   By: Lowella Grip III M.D.   On: 07/29/2018 12:05    Procedures .Critical Care Performed by: Domenic Moras, PA-C Authorized by: Domenic Moras, PA-C   Critical care provider statement:    Critical care time (minutes):  45   Critical care was time spent personally by me on the following activities:  Discussions with consultants, evaluation of patient's response to treatment,  examination of patient, ordering and performing treatments and interventions, ordering and review of laboratory studies, ordering and review of radiographic studies, pulse oximetry, re-evaluation of patient's condition, obtaining  history from patient or surrogate and review of old charts   (including critical care time)  Medications Ordered in ED Medications  albuterol (PROVENTIL) (2.5 MG/3ML) 0.083% nebulizer solution 5 mg (has no administration in time range)  ipratropium (ATROVENT) nebulizer solution 0.5 mg (has no administration in time range)  heparin bolus via infusion 3,750 Units (has no administration in time range)  heparin ADULT infusion 100 units/mL (25000 units/262mL sodium chloride 0.45%) (has no administration in time range)     Initial Impression / Assessment and Plan / ED Course  I have reviewed the triage vital signs and the nursing notes.  Pertinent labs & imaging results that were available during my care of the patient were reviewed by me and considered in my medical decision making (see chart for details).     BP 119/62 (BP Location: Right Arm)   Pulse 61   Temp 97.8 F (36.6 C) (Oral)   Resp (!) 26   Ht 5\' 9"  (1.753 m)   Wt 63 kg   SpO2 99%   BMI 20.53 kg/m    Final Clinical Impressions(s) / ED Diagnoses   Final diagnoses:  NSTEMI (non-ST elevated myocardial infarction) (HCC)  Shortness of breath  Pleural effusion on right    ED Discharge Orders    None     11:49 AM Patient with history of CAD and COPD here with complaints of shortness of breath and chest pain.  He is currently denies having active chest pain.  He did report receiving nitro prior to arrival which did help.  He was initially found to be hypoxic with an O2 of 88% on room air, currently at 100% on 3 L of O2.  He does have some faint wheezes on exam.  He is a DNR.  Work-up initiated.  1:45 PM Lab is remarkable for an elevated troponin of 0.18, EKG without acute changes.  Patient is having a NSTEMI. I have initiated heparin per pharmacy consult.  I have consulted cardiology who recommend medical admission and they will be available for consultation.  Pt and family made aware of plan.    2:03 PM Appreciate  consultation from [facility] who agrees to see and admit pt for further management of his NSTEMI and R pleural effusion.     Domenic Moras, PA-C 07/29/18 1405    Jola Schmidt, MD 07/29/18 [phone]

## 2018-07-30 ENCOUNTER — Other Ambulatory Visit: Payer: Self-pay

## 2018-07-30 DIAGNOSIS — N179 Acute kidney failure, unspecified: Secondary | ICD-10-CM | POA: Diagnosis not present

## 2018-07-30 DIAGNOSIS — J918 Pleural effusion in other conditions classified elsewhere: Secondary | ICD-10-CM | POA: Diagnosis not present

## 2018-07-30 DIAGNOSIS — R079 Chest pain, unspecified: Secondary | ICD-10-CM | POA: Diagnosis not present

## 2018-07-30 DIAGNOSIS — I130 Hypertensive heart and chronic kidney disease with heart failure and stage 1 through stage 4 chronic kidney disease, or unspecified chronic kidney disease: Secondary | ICD-10-CM | POA: Diagnosis not present

## 2018-07-30 DIAGNOSIS — Z950 Presence of cardiac pacemaker: Secondary | ICD-10-CM | POA: Diagnosis not present

## 2018-07-30 DIAGNOSIS — R69 Illness, unspecified: Secondary | ICD-10-CM | POA: Diagnosis not present

## 2018-07-30 DIAGNOSIS — I5043 Acute on chronic combined systolic (congestive) and diastolic (congestive) heart failure: Secondary | ICD-10-CM | POA: Diagnosis not present

## 2018-07-30 DIAGNOSIS — I214 Non-ST elevation (NSTEMI) myocardial infarction: Secondary | ICD-10-CM | POA: Diagnosis not present

## 2018-07-30 DIAGNOSIS — N183 Chronic kidney disease, stage 3 (moderate): Secondary | ICD-10-CM | POA: Diagnosis not present

## 2018-07-30 DIAGNOSIS — I10 Essential (primary) hypertension: Secondary | ICD-10-CM | POA: Diagnosis not present

## 2018-07-30 DIAGNOSIS — Z66 Do not resuscitate: Secondary | ICD-10-CM | POA: Diagnosis not present

## 2018-07-30 DIAGNOSIS — Z951 Presence of aortocoronary bypass graft: Secondary | ICD-10-CM | POA: Diagnosis not present

## 2018-07-30 DIAGNOSIS — J9601 Acute respiratory failure with hypoxia: Secondary | ICD-10-CM | POA: Diagnosis not present

## 2018-07-30 DIAGNOSIS — I2510 Atherosclerotic heart disease of native coronary artery without angina pectoris: Secondary | ICD-10-CM | POA: Diagnosis not present

## 2018-07-30 LAB — CBC
HCT: 31.6 % — ABNORMAL LOW (ref 39.0–52.0)
Hemoglobin: 10.4 g/dL — ABNORMAL LOW (ref 13.0–17.0)
MCH: 32.3 pg (ref 26.0–34.0)
MCHC: 32.9 g/dL (ref 30.0–36.0)
MCV: 98.1 fL (ref 78.0–100.0)
Platelets: 138 10*3/uL — ABNORMAL LOW (ref 150–400)
RBC: 3.22 MIL/uL — ABNORMAL LOW (ref 4.22–5.81)
RDW: 14.6 % (ref 11.5–15.5)
WBC: 4.6 10*3/uL (ref 4.0–10.5)

## 2018-07-30 LAB — HEPARIN LEVEL (UNFRACTIONATED): Heparin Unfractionated: 0.42 IU/mL (ref 0.30–0.70)

## 2018-07-30 MED ORDER — ORAL CARE MOUTH RINSE
15.0000 mL | Freq: Two times a day (BID) | OROMUCOSAL | Status: DC
  Administered 2018-07-30 – 2018-08-02 (×5): 15 mL via OROMUCOSAL

## 2018-07-30 NOTE — Progress Notes (Signed)
ANTICOAGULATION CONSULT NOTE - Follow Up Consult  Pharmacy Consult for heparin Indication: chest pain/ACS  No Known Allergies  Patient Measurements: Height: 5\' 9"  (175.3 cm) Weight: 144 lb 6.4 oz (65.5 kg) IBW/kg (Calculated) : 70.7 Heparin Dosing Weight: 63 kg  Vital Signs: Temp: 98 F (36.7 C) (09/21 1603) Temp Source: Oral (09/21 1603) BP: 131/59 (09/21 1603) Pulse Rate: 62 (09/21 1603)  Labs: Recent Labs    07/29/18 1119 07/29/18 1552 07/29/18 2257 07/30/18 0625  HGB 9.1*  --   --  10.4*  HCT 28.6*  --   --  31.6*  PLT 127*  --   --  138*  HEPARINUNFRC  --   --  0.47 0.42  CREATININE 1.31*  --   --   --   TROPONINI  --  0.25* 0.22*  --     Estimated Creatinine Clearance: 33.3 mL/min (A) (by C-G formula based on SCr of 1.31 mg/dL (H)).   Medical History: Past Medical History:  Diagnosis Date  . Anemia   . Arthritis   . CAD (coronary artery disease)    a. CABG '89; b. PCI '04; c. 11/2015 Cath/PCI: LM 20ost, LAD 159m, LCX 30p, OM2 40, RCA 30p, 10p ISR, 90d (3.0x12 Synergy DES), AM 90, VG->OM2 known to be 100, LIMA->LAD not injected, patent in 2015.  . Carotid stenosis, right   . Chronic combined systolic and diastolic CHF (congestive heart failure) ([place])   . CKD (chronic kidney disease), stage III ([place])   . H/O: GI bleed    REMOTE HISTORY  . History of COPD   . Hyperlipidemia   . Hypertensive heart disease   . LBBB (left bundle branch block)   . Pacemaker Sept 2009   St Jude  . PAF (paroxysmal atrial fibrillation) (HCC)    a. not on anticoag due to prior GIB.  . Sick sinus syndrome [facility]) Sept 2009   ST Jude PTVDP  . Stroke [facility])     Medications:  Scheduled:  . aspirin EC  81 mg Oral Daily  . atorvastatin  40 mg Oral QPM  . carvedilol  12.5 mg Oral BID WC  . clopidogrel  75 mg Oral Daily  . ezetimibe  10 mg Oral Daily  . ferrous sulfate  325 mg Oral BID WC  . furosemide  60 mg Oral BID  . ipratropium-albuterol  3 mL Nebulization TID  .  isosorbide mononitrate  90 mg Oral Daily  . [START ON 08/01/2018] latanoprost  1 drop Both Eyes Once per day on Mon Wed  . levothyroxine  125 mcg Oral QAC breakfast  . pantoprazole  40 mg Oral Daily  . polyethylene glycol  17 g Oral Daily  . ranolazine  1,000 mg Oral BID  . tamsulosin  0.4 mg Oral Daily    Assessment: 71 yom with known hx of CAD, St Jude pacemaker, atrial fibrillation (not on anticoag d/t prior GIB) presenting with SOB and chest pain. Slight bump in trop but no invasice procedure planned.  CP improved with tylenol and ranexa, imdur, CAD - also on DAPT, statin  Cbc stable no bleeding currently on heparin drip 750 uts/hr HL 0.4 at goal.  Follow up change to VTE px  Goal of Therapy:  Heparin level 0.3-0.7 units/ml Monitor platelets by anticoagulation protocol: Yes   Plan:  Continue heparin 750 uts/hr Daily HL, CBC F/u change to VTE px  Bonnita Nasuti Pharm.D. CPP, BCPS Clinical Pharmacist [phone] 07/30/2018 4:28 PM

## 2018-07-30 NOTE — Progress Notes (Signed)
1        PROGRESS NOTE    Patient: John Peck                            PCP: Seward Carol, MD                    DOB: 02/18/1924            DOA: 07/29/2018 [id number]             DOS: 07/30/2018, 11:02 AM   LOS: 0 days   Date of Service: The patient was seen and examined on 07/30/2018  Subjective:   Patient was seen and examined this morning, was awake alert, was able to follow commands.  In bed.  Was not complaining of any chest pain or shortness of breath.  Brief Narrative:   John Peck is a 84 y.o. male with medical history significant for CAD status post CABG times 12/29/1987 with stenting of the RCA in 2004, A. fib and sick sinus syndrome status post permanent pacemaker 2009, essential hypertension, chronic kidney disease stage III, hypothyroidism, hyperlipidemia, dementia who was brought to the ED from his skilled nursing facility this morning after patient complained of chest pain and shortness of breath.  He has had chronic shortness of breath for several years but this is gotten worse over the last 2 or 3 days.  He states that he had chest pain that bothered him while he was trying to sleep last night.  It was described as substernal, nonradiating, 8 out of 10 at its worst.  It was relieved with one nitroglycerin.  He reports no lower extremity edema, no orthopnea, no fevers chills or cough.  Last cardiac cath in April 2018 showed right coronary was unchanged and the native left coronary demonstrated wide patency of the stent that had been recently implanted.  He was found to have a moderate R sided pleural effusion in July 2019. This was thought to be 2/2 CHF exacerbation. He had 1.5 L drained at the time. Fluid studies were consistent with transudate; no organisms on gram stain, neg culture.   ED Course: He was awake but sleepy and a poor historian.  He is accompanied by his wife and daughter. Temperature was 97.8.  Pulse rate 61.  Respiratory rate 26.  Blood  pressure 119/62.  He was initially hypoxic with an O2 sat of 88% on room air.  It was then 100% on 3 L and by the time of my interview he was satting 98% on room air.  EKG showed sinus rhythm, left bundle branch block.  Initial troponin was 0.18.  BNP was 360.4.  Chest x-ray showed a moderate right pleural effusion with right mid and lower lung zone atelectasis, no frank consolidation, stable cardiac silhouette.  He was started on heparin per ACS protocol. Principal Problem:   Chest pain Active Problems:   Essential hypertension   S/P CABG x 2 '89. RCA stent'04. last cath Jan 2013   PACEMAKER, PERMANENT- St Jude Sept 2009   History of iron deficiency anemia   PAF (paroxysmal atrial fibrillation) (HCC)   Hypertensive heart disease   Hyperlipidemia   CKD (chronic kidney disease), stage III (HCC)   Hypothyroidism   Pleural effusion   Acute respiratory failure with hypoxia (HCC)    Assessment & Plan:     NSTEMI.  -Currently chest pain-free, elevated troponin, -Cardiology following, on heparin drip -  Apparently patient not scheduled for any further intervention including cardiac catheterization Has been n.p.o. after midnight, if no further recommendation will resume p.o. intake C. EKG in a.m. NTG prn. Cont BB and imdur, cont plavix, ASA, ranexa.  Acute respiratory failure with hypoxia; -Respiratory distress has resolved, currently on O2 via nasal cannula, comfortable -Reaccumulation of right pleural effusion -Monitor closely -Continue DuoNeb bronchodilators, O2 via nasal cannula, (No formal diagnosis of COPD)  Chronic systolic and diastolic CHF not thought to be in exacerbation: cont home dose of lasix. Na restricted diet.  HTN: cont home carvedilol, Imdur home doses  PAF (not on chronic anticoagulation 2/2 GIB): s/p PPM. Cont carvedilol  HLD: cont lipitor, zetia  Hypothyroidism: cont levothyroxine   DVT prophylaxis: Heparin drip per cardiology  Code Status:   Code  Status: DNR/DNI -confirmed with family  Family Communication:  The above findings and plan of care has been discussed with patient and family in detail, they expressed understanding and agreement of above.  Disposition Plan:   Anticipated 1-2 days  Consultants: Cardiology  Procedures:   No admission procedures for hospital encounter.    Antimicrobials:  Anti-infectives (From admission, onward)   None       Medication:  . aspirin EC  81 mg Oral Daily  . atorvastatin  40 mg Oral QPM  . carvedilol  12.5 mg Oral BID WC  . clopidogrel  75 mg Oral Daily  . ezetimibe  10 mg Oral Daily  . ferrous sulfate  325 mg Oral BID WC  . furosemide  60 mg Oral BID  . ipratropium-albuterol  3 mL Nebulization TID  . isosorbide mononitrate  90 mg Oral Daily  . [START ON 08/01/2018] latanoprost  1 drop Both Eyes Once per day on Mon Wed  . levothyroxine  125 mcg Oral QAC breakfast  . pantoprazole  40 mg Oral Daily  . polyethylene glycol  17 g Oral Daily  . ranolazine  1,000 mg Oral BID  . tamsulosin  0.4 mg Oral Daily    acetaminophen, albuterol, ALPRAZolam, nitroGLYCERIN, ondansetron (ZOFRAN) IV, RESOURCE THICKENUP CLEAR, senna     Objective:   Vitals:   07/30/18 0358 07/30/18 0500 07/30/18 0716 07/30/18 0832  BP: (!) 156/76   (!) 155/65  Pulse: 63   (!) 59  Resp: 19   15  Temp: 98.1 F (36.7 C)   98 F (36.7 C)  TempSrc: Oral   Oral  SpO2: 98%  95% 95%  Weight:  65.5 kg    Height:        Intake/Output Summary (Last 24 hours) at 07/30/2018 1102 Last data filed at 07/30/2018 0900 Gross per 24 hour  Intake 331.18 ml  Output 975 ml  Net -643.82 ml   Filed Weights   07/29/18 1300 07/29/18 1900 07/30/18 0500  Weight: 63 kg 62.6 kg 65.5 kg     Examination:    General exam: Appears calm and comfortable  BP (!) 155/65 (BP Location: Right Arm)   Pulse (!) 59   Temp 98 F (36.7 C) (Oral)   Resp 15   Ht 5\' 9"  (1.753 m)   Wt 65.5 kg   SpO2 95%   BMI 21.32 kg/m      Physical Exam  Constitution:  Alert, cooperative, no distress,  Psychiatric: Normal and stable mood and affect, cognition intact,   HEENT: Normocephalic, PERRL, otherwise with in Normal limits  Chest:Chest symmetric Cardio vascular:  S1/S2, RRR, No murmure, No Rubs or Gallops  pulmonary: Clear  to auscultation bilaterally, respirations unlabored, negative wheezes / crackles Abdomen: Soft, non-tender, non-distended, bowel sounds,no masses, no organomegaly Muscular skeletal: Limited exam - in bed, able to move all 4 extremities, Normal strength,  Neuro: CNII-XII intact. , normal motor and sensation, reflexes intact  Extremities: No pitting edema lower extremities, +2 pulses  Skin: Dry, warm to touch, negative for any Rashes, No open wounds Wounds: per nursing documentation  LABs:  CBC Latest Ref Rng & Units 07/30/2018 07/29/2018 07/06/2018  WBC 4.0 - 10.5 K/uL 4.6 4.3 4.7  Hemoglobin 13.0 - 17.0 g/dL 10.4(L) 9.1(L) 9.5(L)  Hematocrit 39.0 - 52.0 % 31.6(L) 28.6(L) 28.7(L)  Platelets 150 - 400 K/uL 138(L) 127(L) 136(L)   CMP Latest Ref Rng & Units 07/29/2018 07/06/2018 07/04/2018  Glucose 70 - 99 mg/dL 104(H) 85 113(H)  BUN 8 - 23 mg/dL 25(H) 22 24(H)  Creatinine 0.61 - 1.24 mg/dL 1.31(H) 1.11 1.21  Sodium 135 - 145 mmol/L 140 141 143  Potassium 3.5 - 5.1 mmol/L 4.1 4.2 3.9  Chloride 98 - 111 mmol/L 106 110 112(H)  CO2 22 - 32 mmol/L 27 23 24   Calcium 8.9 - 10.3 mg/dL 8.5(L) 8.4(L) 8.2(L)  Total Protein 6.5 - 8.1 g/dL - - -  Total Bilirubin 0.3 - 1.2 mg/dL - - -  Alkaline Phos 38 - 126 U/L - - -  AST 15 - 41 U/L - - -  ALT 0 - 44 U/L - - -

## 2018-07-30 NOTE — Progress Notes (Signed)
Progress Note  Patient Name: John Peck Date of Encounter: 07/30/2018  Primary Cardiologist: Shelva Majestic, MD   Subjective   Well today.  Had some right sided chest/abdominal pain yesterday while eating.  Relieved with Tylenol.  Inpatient Medications    Scheduled Meds: . aspirin EC  81 mg Oral Daily  . atorvastatin  40 mg Oral QPM  . carvedilol  12.5 mg Oral BID WC  . clopidogrel  75 mg Oral Daily  . ezetimibe  10 mg Oral Daily  . ferrous sulfate  325 mg Oral BID WC  . furosemide  60 mg Oral BID  . ipratropium-albuterol  3 mL Nebulization TID  . isosorbide mononitrate  90 mg Oral Daily  . [START ON 08/01/2018] latanoprost  1 drop Both Eyes Once per day on Mon Wed  . levothyroxine  125 mcg Oral QAC breakfast  . pantoprazole  40 mg Oral Daily  . polyethylene glycol  17 g Oral Daily  . ranolazine  1,000 mg Oral BID  . tamsulosin  0.4 mg Oral Daily   Continuous Infusions: . heparin 750 Units/hr (07/29/18 1524)   PRN Meds: acetaminophen, albuterol, ALPRAZolam, nitroGLYCERIN, ondansetron (ZOFRAN) IV, RESOURCE THICKENUP CLEAR, senna   Vital Signs    Vitals:   07/30/18 0358 07/30/18 0500 07/30/18 0716 07/30/18 0832  BP: (!) 156/76   (!) 155/65  Pulse: 63   (!) 59  Resp: 19   15  Temp: 98.1 F (36.7 C)   98 F (36.7 C)  TempSrc: Oral   Oral  SpO2: 98%  95% 95%  Weight:  65.5 kg    Height:        Intake/Output Summary (Last 24 hours) at 07/30/2018 1126 Last data filed at 07/30/2018 0900 Gross per 24 hour  Intake 331.18 ml  Output 975 ml  Net -643.82 ml   Filed Weights   07/29/18 1300 07/29/18 1900 07/30/18 0500  Weight: 63 kg 62.6 kg 65.5 kg    Telemetry    AV paced- Personally Reviewed  ECG    AV paced- Personally Reviewed  Physical Exam   GEN: No acute distress.   Neck: No JVD Cardiac: RRR, no murmurs, rubs, or gallops.  Respiratory: Clear to auscultation bilaterally. GI: Soft, nontender, non-distended  MS: No edema; No deformity. Neuro:   Nonfocal  Psych: Normal affect   Labs    Chemistry Recent Labs  Lab 07/29/18 1119  NA 140  K 4.1  CL 106  CO2 27  GLUCOSE 104*  BUN 25*  CREATININE 1.31*  CALCIUM 8.5*  GFRNONAA 46*  GFRAA 53*  ANIONGAP 7     Hematology Recent Labs  Lab 07/29/18 1119 07/30/18 0625  WBC 4.3 4.6  RBC 2.86* 3.22*  HGB 9.1* 10.4*  HCT 28.6* 31.6*  MCV 100.0 98.1  MCH 31.8 32.3  MCHC 31.8 32.9  RDW 15.0 14.6  PLT 127* 138*    Cardiac Enzymes Recent Labs  Lab 07/29/18 1552 07/29/18 2257  TROPONINI 0.25* 0.22*    Recent Labs  Lab 07/29/18 1139  TROPIPOC 0.18*     BNP Recent Labs  Lab 07/29/18 1119  BNP 360.4*     DDimer No results for input(s): DDIMER in the last 168 hours.   Radiology    Dg Chest 2 View  Result Date: 07/29/2018 CLINICAL DATA:  Shortness of breath and chest pain EXAM: CHEST - 2 VIEW COMPARISON:  July 02, 2018 FINDINGS: There is a pleural effusion on the right with atelectatic change  in the right mid and lower lung zones. There is no frank consolidation. Heart is upper normal in size with pulmonary vascularity normal. Pacemaker leads are attached to the right atrium and right ventricle. Patient is status post internal mammary bypass grafting. No adenopathy. No bone lesions. IMPRESSION: Moderate right pleural effusion with right mid and lower lung zone atelectatic change. No frank consolidation. Stable cardiac silhouette. Pacemaker leads attached to right atrium and right ventricle. No pneumothorax. Electronically Signed   By: Lowella Grip III M.D.   On: 07/29/2018 12:05    Cardiac Studies   TTE 03/17/2018 - Left ventricle: The cavity size was normal. Wall thickness was   increased in a pattern of moderate LVH. Systolic function was   mildly reduced. The estimated ejection fraction was in the range   of 45% to 50%. Hypokinesis of the inferior myocardium. - Aortic valve: There was mild regurgitation. - Mitral valve: There was moderate  regurgitation. - Left atrium: The atrium was moderately dilated. - Right ventricle: The cavity size was mildly dilated. Wall   thickness was normal. - Right atrium: The atrium was moderately dilated. - Tricuspid valve: There was moderate regurgitation. - Pulmonary arteries: Systolic pressure was moderately increased.   PA peak pressure: 48 mm Hg (S).   Patient Profile     84 y.o. male with a complex past medical history including coronary disease status post CABG and multiple stents, paroxysmal atrial fibrillation, sick sinus syndrome status post pacemaker implant who presented to the hospital for evaluation of chest pain.  Assessment & Plan    1.  Chest pain: Troponins have not significantly gone up fortunately.  This could be represented by an STEMI, though I do not feel like this is due to plaque rupture and thrombus.  Could be due to demand ischemia.  He is currently on heparin, but with his flat troponins, would be okay stopping heparin drip today.  He is currently on dual antiplatelets, beta-blocker and Ranexa.  He does not wish to have any further invasive cardiac evaluation at this time.  2.  Acute respiratory failure with hypoxia and right sided pleural effusion: Concerned that this may represent heart failure, though his BNP is fairly low.  Further evaluation per primary team.  3.  Paroxysmal atrial fibrillation: Currently AV paced.  Not anticoagulated due to history of GI bleed.  4.  Chronic combined heart failure: Does not appear to be decompensated.  No changes.   [facility] Macarius Ruark sign off.   Medication Recommendations: No medication changes from home meds Other recommendations (labs, testing, etc): None Follow up as an outpatient: Follow-up scheduled 08/08/2018 with Ellouise Newer  For questions or updates, please contact [place] Please consult [email or web address] for contact info under        Signed, Aaron Boeh Meredith Leeds, MD  07/30/2018, 11:26 AM

## 2018-07-31 ENCOUNTER — Observation Stay (HOSPITAL_COMMUNITY): Payer: Medicare HMO

## 2018-07-31 DIAGNOSIS — J9601 Acute respiratory failure with hypoxia: Secondary | ICD-10-CM | POA: Diagnosis not present

## 2018-07-31 DIAGNOSIS — Z7189 Other specified counseling: Secondary | ICD-10-CM | POA: Diagnosis not present

## 2018-07-31 DIAGNOSIS — Z950 Presence of cardiac pacemaker: Secondary | ICD-10-CM | POA: Diagnosis not present

## 2018-07-31 DIAGNOSIS — Z515 Encounter for palliative care: Secondary | ICD-10-CM

## 2018-07-31 DIAGNOSIS — I10 Essential (primary) hypertension: Secondary | ICD-10-CM | POA: Diagnosis not present

## 2018-07-31 DIAGNOSIS — I214 Non-ST elevation (NSTEMI) myocardial infarction: Secondary | ICD-10-CM | POA: Diagnosis not present

## 2018-07-31 DIAGNOSIS — R079 Chest pain, unspecified: Secondary | ICD-10-CM | POA: Diagnosis not present

## 2018-07-31 DIAGNOSIS — R109 Unspecified abdominal pain: Secondary | ICD-10-CM

## 2018-07-31 DIAGNOSIS — J90 Pleural effusion, not elsewhere classified: Secondary | ICD-10-CM | POA: Diagnosis not present

## 2018-07-31 DIAGNOSIS — N183 Chronic kidney disease, stage 3 (moderate): Secondary | ICD-10-CM | POA: Diagnosis not present

## 2018-07-31 LAB — HEPARIN LEVEL (UNFRACTIONATED): Heparin Unfractionated: 0.3 IU/mL (ref 0.30–0.70)

## 2018-07-31 LAB — BASIC METABOLIC PANEL
Anion gap: 11 (ref 5–15)
BUN: 27 mg/dL — AB (ref 8–23)
CALCIUM: 8.6 mg/dL — AB (ref 8.9–10.3)
CO2: 24 mmol/L (ref 22–32)
CREATININE: 2.21 mg/dL — AB (ref 0.61–1.24)
Chloride: 102 mmol/L (ref 98–111)
GFR calc non Af Amer: 24 mL/min — ABNORMAL LOW (ref >60)
GFR, EST AFRICAN AMERICAN: 28 mL/min — AB (ref >60)
GLUCOSE: 109 mg/dL — AB (ref 70–99)
Potassium: 4.3 mmol/L (ref 3.5–5.1)
Sodium: 137 mmol/L (ref 135–145)

## 2018-07-31 LAB — CBC
HCT: 29 % — ABNORMAL LOW (ref 39.0–52.0)
Hemoglobin: 9.7 g/dL — ABNORMAL LOW (ref 13.0–17.0)
MCH: 32.3 pg (ref 26.0–34.0)
MCHC: 33.4 g/dL (ref 30.0–36.0)
MCV: 96.7 fL (ref 78.0–100.0)
Platelets: 120 10*3/uL — ABNORMAL LOW (ref 150–400)
RBC: 3 MIL/uL — ABNORMAL LOW (ref 4.22–5.81)
RDW: 14.5 % (ref 11.5–15.5)
WBC: 5.2 10*3/uL (ref 4.0–10.5)

## 2018-07-31 MED ORDER — ISOSORBIDE MONONITRATE ER 30 MG PO TB24
90.0000 mg | ORAL_TABLET | Freq: Every day | ORAL | Status: DC
  Administered 2018-07-31 – 2018-08-02 (×3): 90 mg via ORAL
  Filled 2018-07-31 (×3): qty 3

## 2018-07-31 MED ORDER — FUROSEMIDE 20 MG PO TABS: 30.0000 mg | ORAL_TABLET | Freq: Two times a day (BID) | ORAL | Status: DC

## 2018-07-31 MED ORDER — LIDOCAINE 5 % EX PTCH
1.0000 | MEDICATED_PATCH | CUTANEOUS | Status: DC
  Administered 2018-07-31 – 2018-08-01 (×2): 1 via TRANSDERMAL
  Filled 2018-07-31 (×2): qty 1

## 2018-07-31 MED ORDER — HEPARIN SODIUM (PORCINE) 5000 UNIT/ML IJ SOLN
5000.0000 [IU] | Freq: Three times a day (TID) | INTRAMUSCULAR | Status: DC
  Administered 2018-07-31 – 2018-08-02 (×5): 5000 [IU] via SUBCUTANEOUS
  Filled 2018-07-31 (×4): qty 1

## 2018-07-31 MED ORDER — SENNA 8.6 MG PO TABS
1.0000 | ORAL_TABLET | Freq: Once | ORAL | Status: AC
  Administered 2018-07-31: 8.6 mg via ORAL
  Filled 2018-07-31: qty 1

## 2018-07-31 MED ORDER — ACETAMINOPHEN 500 MG PO TABS
1000.0000 mg | ORAL_TABLET | Freq: Three times a day (TID) | ORAL | Status: DC | PRN
  Administered 2018-07-31 – 2018-08-01 (×2): 1000 mg via ORAL
  Filled 2018-07-31 (×3): qty 2

## 2018-07-31 NOTE — Progress Notes (Signed)
1        PROGRESS NOTE    Patient: John Peck                            PCP: Seward Carol, MD                    DOB: 10-May-1924            DOA: 07/29/2018 [id number]             DOS: 07/31/2018, 9:58 AM   LOS: 0 days   Date of Service: The patient was seen and examined on 07/31/2018  Subjective:   Patient was seen and examined this morning, stable no acute distress, denied having any substernal chest pain.  Denies any shortness of breath. NO Issues overnight. Patient did complain of right flank, right rib pain.   Brief Narrative:   John Peck is a 84 y.o. male with medical history significant for CAD status post CABG times 12/29/1987 with stenting of the RCA in 2004, A. fib and sick sinus syndrome status post permanent pacemaker 2009, essential hypertension, chronic kidney disease stage III, hypothyroidism, hyperlipidemia, dementia who was brought to the ED from his skilled nursing facility this morning after patient complained of chest pain and shortness of breath.  He has had chronic shortness of breath for several years but this is gotten worse over the last 2 or 3 days.  He states that he had chest pain that bothered him while he was trying to sleep last night.  It was described as substernal, nonradiating, 8 out of 10 at its worst.  It was relieved with one nitroglycerin.  He reports no lower extremity edema, no orthopnea, no fevers chills or cough.  Last cardiac cath in April 2018 showed right coronary was unchanged and the native left coronary demonstrated wide patency of the stent that had been recently implanted.  He was found to have a moderate R sided pleural effusion in July 2019. This was thought to be 2/2 CHF exacerbation. He had 1.5 L drained at the time. Fluid studies were consistent with transudate; no organisms on gram stain, neg culture.  ED Course: He was awake but sleepy and a poor historian.  He is accompanied by his wife and daughter. Temperature  was 97.8.  Pulse rate 61.  Respiratory rate 26.  Blood pressure 119/62.  He was initially hypoxic with an O2 sat of 88% on room air.  It was then 100% on 3 L and by the time of my interview he was satting 98% on room air.  EKG showed sinus rhythm, left bundle branch block.  Initial troponin was 0.18.  BNP was 360.4.  Chest x-ray showed a moderate right pleural effusion with right mid and lower lung zone atelectasis, no frank consolidation, stable cardiac silhouette.  He was started on heparin per ACS protocol.  Principal Problem:   Chest pain Active Problems:   Essential hypertension   S/P CABG x 2 '89. RCA stent'04. last cath Jan 2013   PACEMAKER, PERMANENT- St Jude Sept 2009   History of iron deficiency anemia   PAF (paroxysmal atrial fibrillation) (HCC)   Hypertensive heart disease   Hyperlipidemia   CKD (chronic kidney disease), stage III (HCC)   Hypothyroidism   NSTEMI (non-ST elevated myocardial infarction) ([place])   Pleural effusion   Acute respiratory failure with hypoxia ([place])    Assessment & Plan:  NSTEMI.  -Currently chest pain-free -Cardiology neurology was following, apparently signed off, recommended to DC heparin drip, continue dual antiplatelet therapy, -Apparently patient requested no further intervention. - Cont BB and imdur, cont plavix, ASA, ranexa. DC heparin drip  Acute respiratory failure with hypoxia; -Respiratory distress has resolved, currently on O2 via nasal cannula, comfortable -Reaccumulation of right pleural effusion -Monitor closely -Continue DuoNeb bronchodilators, O2 via nasal cannula, (No formal diagnosis of COPD)  Chronic systolic and diastolic CHF not thought to be in exacerbation: cont home dose of lasix. Na restricted diet.  HTN: cont home carvedilol, Imdur home doses  PAF (not on chronic anticoagulation 2/2 GIB): s/p PPM. Cont carvedilol  HLD: cont lipitor, zetia  Hypothyroidism: cont levothyroxine  Right flank/rib  pain -We will obtain rib x-rays -PRN analgesics   DVT prophylaxis: Heparin/scd   Code Status:   Code Status: DNR/DNI -confirmed with family  Family Communication:  The above findings and plan of care has been discussed with patient and family in detail, they expressed understanding and agreement of above.  Disposition Plan: Stable discharge in a.m. on 9/23  Consultants: Cardiology  Procedures:   No admission procedures for hospital encounter.    Antimicrobials:  Anti-infectives (From admission, onward)   None       Medication:  . aspirin EC  81 mg Oral Daily  . atorvastatin  40 mg Oral QPM  . carvedilol  12.5 mg Oral BID WC  . clopidogrel  75 mg Oral Daily  . ezetimibe  10 mg Oral Daily  . ferrous sulfate  325 mg Oral BID WC  . furosemide  60 mg Oral BID  . heparin  5,000 Units Subcutaneous Q8H  . ipratropium-albuterol  3 mL Nebulization TID  . isosorbide mononitrate  90 mg Oral Daily  . [START ON 08/01/2018] latanoprost  1 drop Both Eyes Once per day on Mon Wed  . levothyroxine  125 mcg Oral QAC breakfast  . mouth rinse  15 mL Mouth Rinse BID  . pantoprazole  40 mg Oral Daily  . polyethylene glycol  17 g Oral Daily  . ranolazine  1,000 mg Oral BID  . tamsulosin  0.4 mg Oral Daily    acetaminophen, albuterol, ALPRAZolam, nitroGLYCERIN, ondansetron (ZOFRAN) IV, RESOURCE THICKENUP CLEAR, senna     Objective:   Vitals:   07/30/18 2250 07/31/18 0302 07/31/18 0417 07/31/18 0742  BP: (!) 156/71 (!) 163/71  (!) 168/75  Pulse: 60   60  Resp: 20 (!) 24  (!) 23  Temp: 97.6 F (36.4 C) 98 F (36.7 C)  98.5 F (36.9 C)  TempSrc: Oral Oral  Oral  SpO2: 100%  99%   Weight:      Height:        Intake/Output Summary (Last 24 hours) at 07/31/2018 0958 Last data filed at 07/31/2018 0800 Gross per 24 hour  Intake 696.07 ml  Output 1250 ml  Net -553.93 ml   Filed Weights   07/29/18 1300 07/29/18 1900 07/30/18 0500  Weight: 63 kg 62.6 kg 65.5 kg      Examination:   BP (!) 168/75 (BP Location: Right Arm)   Pulse 60   Temp 98.5 F (36.9 C) (Oral)   Resp (!) 23   Ht 5\' 9"  (1.753 m)   Wt 65.5 kg   SpO2 99%   BMI 21.32 kg/m    Physical Exam  Constitution:  Alert, cooperative, no distress,  Psychiatric: Normal and stable mood and affect, cognition intact,   HEENT:  Normocephalic, PERRL, otherwise with in Normal limits  Chest:Chest symmetric, right flank/chest, rib pain Cardio vascular:  S1/S2, RRR, No murmure, No Rubs or Gallops  pulmonary: Clear to auscultation bilaterally, respirations unlabored, negative wheezes / crackles Abdomen: Soft, non-tender, non-distended, bowel sounds,no masses, no organomegaly Muscular skeletal: Limited exam - in bed, able to move all 4 extremities, Normal strength,  Neuro: CNII-XII intact. , normal motor and sensation, reflexes intact  Extremities: No pitting edema lower extremities, +2 pulses  Skin: Dry, warm to touch, negative for any Rashes, No open wounds Wounds: per nursing documentation     LABs:  CBC Latest Ref Rng & Units 07/31/2018 07/30/2018 07/29/2018  WBC 4.0 - 10.5 K/uL 5.2 4.6 4.3  Hemoglobin 13.0 - 17.0 g/dL 9.7(L) 10.4(L) 9.1(L)  Hematocrit 39.0 - 52.0 % 29.0(L) 31.6(L) 28.6(L)  Platelets 150 - 400 K/uL 120(L) 138(L) 127(L)   CMP Latest Ref Rng & Units 07/31/2018 07/29/2018 07/06/2018  Glucose 70 - 99 mg/dL 109(H) 104(H) 85  BUN 8 - 23 mg/dL 27(H) 25(H) 22  Creatinine 0.61 - 1.24 mg/dL 2.21(H) 1.31(H) 1.11  Sodium 135 - 145 mmol/L 137 140 141  Potassium 3.5 - 5.1 mmol/L 4.3 4.1 4.2  Chloride 98 - 111 mmol/L 102 106 110  CO2 22 - 32 mmol/L 24 27 23   Calcium 8.9 - 10.3 mg/dL 8.6(L) 8.5(L) 8.4(L)  Total Protein 6.5 - 8.1 g/dL - - -  Total Bilirubin 0.3 - 1.2 mg/dL - - -  Alkaline Phos 38 - 126 U/L - - -  AST 15 - 41 U/L - - -  ALT 0 - 44 U/L - - -

## 2018-07-31 NOTE — Evaluation (Signed)
Physical Therapy Evaluation Patient Details Name: John Peck MRN: [id number] DOB: 06-25-1924 Today's Date: 07/31/2018   History of Present Illness  84 yo admitted with CP, SOB, NSTEMI. PMhx: dementia, CAD, CABG, AFib, SSS, pacemaker, HTn, CKD, HLD, CHF  Clinical Impression  Pt pleasant on arrival, willing to participate in therapy. Pt able to ambulate two trials of 80 feet min guard with rollator, with seated rest break for fatigue. Pt reports " we should do this walking more often!" Pt with min assist for bed mobility and guard for transfers. Pt with decreased strength, conditioning, and coordination (see PT problem list for all). Pt will benefit acutely from skilled therapy to increase functional mobility, activity tolerance and to decrease fall risk.     Follow Up Recommendations SNF;Supervision for mobility/OOB    Equipment Recommendations  None recommended by PT    Recommendations for Other Services       Precautions / Restrictions Precautions Precautions: Fall Restrictions Weight Bearing Restrictions: No      Mobility  Bed Mobility Overal bed mobility: Needs Assistance Bed Mobility: Rolling;Sidelying to Sit Rolling: Supervision Sidelying to sit: Min assist       General bed mobility comments: rolls with use of rail, min assist to elevate trunk   Transfers Overall transfer level: Needs assistance Equipment used: 4-wheeled walker Transfers: Sit to/from Stand Sit to Stand: From elevated surface;Min assist         General transfer comment: two sit/stand trials. increased time, benefits from momentum to power up, vcs to scoot to edge of chair prior to standing.   Ambulation/Gait Ambulation/Gait assistance: Min guard Gait Distance (Feet): 80 Feet(two trials of 80 ft) Assistive device: 4-wheeled walker Gait Pattern/deviations: Step-through pattern;Decreased stride length;Trunk flexed Gait velocity: slowed Gait velocity interpretation: <1.8 ft/sec, indicate  of risk for recurrent falls General Gait Details: overall steady, ambulation on 2L [place], deconditioned with need for one seated rest break after 80 ft min guard for lines and safety  Stairs            Wheelchair Mobility    Modified Rankin (Stroke Patients Only)       Balance Overall balance assessment: Needs assistance Sitting-balance support: Feet supported;Bilateral upper extremity supported Sitting balance-Leahy Scale: Fair     Standing balance support: Bilateral upper extremity supported;During functional activity Standing balance-Leahy Scale: Fair Standing balance comment: can stand without bil UE support, needs for ambulation                              Pertinent Vitals/Pain Pain Assessment: No/denies pain    Home Living Family/patient expects to be discharged to:: Skilled nursing facility     Type of Home: [place]: [place] - 4 wheels;Hospital bed      Prior Function Level of Independence: Needs assistance         Comments: used 4 wheel walker     Hand Dominance   Dominant Hand: Right    Extremity/Trunk Assessment   Upper Extremity Assessment Upper Extremity Assessment: Generalized weakness    Lower Extremity Assessment Lower Extremity Assessment: Generalized weakness    Cervical / Trunk Assessment Cervical / Trunk Assessment: Kyphotic  Communication   Communication: HOH  Cognition Arousal/Alertness: Awake/alert Behavior During Therapy: WFL for tasks assessed/performed Overall Cognitive Status: History of cognitive impairments - at baseline  General Comments: dementia, able to follow one step commands, able to introduce family.       General Comments General comments (skin integrity, edema, etc.): heels bandaged.     Exercises     Assessment/Plan    PT Assessment Patient needs continued PT services  PT Problem List Decreased  strength;Decreased mobility;Decreased safety awareness;Decreased activity tolerance;Decreased balance;Decreased knowledge of use of DME;Decreased cognition       PT Treatment Interventions DME instruction;Therapeutic activities;Gait training;Therapeutic exercise;Patient/family education;Functional mobility training;Balance training    PT Goals (Current goals can be found in the Care Plan section)  Acute Rehab PT Goals Patient Stated Goal: walk PT Goal Formulation: With patient Time For Goal Achievement: 08/14/18 Potential to Achieve Goals: Fair    Frequency Min 2X/week   Barriers to discharge        Co-evaluation               AM-PAC PT "6 Clicks" Daily Activity  Outcome Measure Difficulty turning over in bed (including adjusting bedclothes, sheets and blankets)?: A Lot Difficulty moving from lying on back to sitting on the side of the bed? : Unable Difficulty sitting down on and standing up from a chair with arms (e.g., wheelchair, bedside commode, etc,.)?: Unable Help needed moving to and from a bed to chair (including a wheelchair)?: A Little Help needed walking in hospital room?: A Little Help needed climbing 3-5 steps with a railing? : Total 6 Click Score: 11    End of Session Equipment Utilized During Treatment: Gait belt;Oxygen Activity Tolerance: Patient tolerated treatment well Patient left: in chair;with call bell/phone within reach;with nursing/sitter in room;with family/visitor present;with chair alarm set Nurse Communication: Mobility status PT Visit Diagnosis: Other abnormalities of gait and mobility (R26.89);Muscle weakness (generalized) (M62.81)    Time: 4268-3419 PT Time Calculation (min) (ACUTE ONLY): 30 min   Charges:   PT Evaluation $PT Eval Moderate Complexity: 1 Mod PT Treatments $Gait Training: 8-22 mins        Samuella Bruin, [place]  Acute Rehab [phone]   Samuella Bruin 07/31/2018, 1:40 PM

## 2018-07-31 NOTE — Consult Note (Signed)
Consultation Note Date: 07/31/2018   Patient Name: John Peck  DOB: [birth year]  MRN: [id number]  Age / Sex: 84 y.o., male  PCP: Seward Carol, MD Referring Physician: Deatra James, MD  Reason for Consultation: Establishing goals of care  HPI/Patient Profile: 84 y.o. male  with past medical history of CAD s/p CABG times 12/29/1987 with stenting of the RCA in 2004, A. fib and sick sinus syndrome status post permanent pacemaker 2009, essential hypertension, chronic kidney disease stage III, hypothyroidism, hyperlipidemia, dementia? admitted from [facility] on 07/29/2018 with chest pain r/t NSTEMI. He has decided to forego invasive testing. He is also complaining of a new right flank pain.   Clinical Assessment and Goals of Care: I met today with Mr. Deeds along with his wife and daughter (reportedly HCPOA), Dorian Pod. Mr. Crist has listed dementia in his listed history but appears to be awake, alert, and clearly conversing with me today and fully aware of his situation. We spent much time discussing a new pain in his right flank which is described further below in symptom management.   We discussed GOC and my concern for his rising creatinine today as well as concern for ability to maintain adequate nutrition and hydration with pureed diet and nectar liquids. We discussed QOL and he has certainly seen a decline in his QOL but enjoys visits from his family daily and feels that he is [facility] well at [facility]. We discussed that at times patient become very tired as their health declines and sometimes will chose to focus on comfort rather than rehospitalization with known illness. Mr. Zinda and family understand that this is an option and are open and accepting. However, Mr. Kahrs continues to believe that he would like to return to the hospital as he feels he is still benefiting in his quality and  quantity of life at this time.  Even though Mr. Arlen desires rehospitalization he is not interested in aggressive or invasive interventions. He clearly shares "I am 84 yo and I have had a good life, I'm ready when God's ready for me." Although he is hopeful for more time and meeting a new great great grandchild soon he is accepting that his life is limited.   All questions/concerns addressed. Emotional support provided.   Primary Decision Maker PATIENT    SUMMARY OF RECOMMENDATIONS   - DNR confirmed - No desire for invasive/aggressive interventions - He does desire rehospitalization  Code Status/Advance Care Planning:  DNR   Symptom Management:   New right flank pain worse with activity, deep breathes, urinating:   Increased Tylenol to 1000 mg q8h prn.   Lidoderm patch.   CXR reveals no fractures although slightly worse moderate right pleural effusion. UA was clear on admission.   Discussed with Dr. Roger Shelter and will order CT abd/pelvis without contrast given worse renal function today.   Also discussed transition to home dose Lasix which is 30 mg BID with hopes of improving renal function.   Scheduled senokot for constipation per patient  request.   Palliative Prophylaxis:   Aspiration, Bowel Regimen, Delirium Protocol, Frequent Pain Assessment and Turn Reposition  Additional Recommendations (Limitations, Scope, Preferences):  Avoid Hospitalization  Psycho-social/Spiritual:   Desire for further Chaplaincy support:yes  Additional Recommendations: Caregiving  Support/Resources and Education on Hospice  Prognosis:   Prognosis poor and likely eligible for hospice at [facility] (if insurance is not payer source for facility).   Discharge Planning: Return to SNF.      Primary Diagnoses: Present on Admission: . PACEMAKER, PERMANENT- St Jude Sept 2009 . Essential hypertension . PAF (paroxysmal atrial fibrillation) ([place]) . Hypertensive heart disease .  Hyperlipidemia . CKD (chronic kidney disease), stage III ([place]) . Hypothyroidism . Pleural effusion . Acute respiratory failure with hypoxia ([place]) . Chest pain   I have reviewed the medical record, interviewed the patient and family, and examined the patient. The following aspects are pertinent.  Past Medical History:  Diagnosis Date  . Anemia   . Arthritis   . CAD (coronary artery disease)    a. CABG '89; b. PCI '04; c. 11/2015 Cath/PCI: LM 20ost, LAD 173m LCX 30p, OM2 40, RCA 30p, 10p ISR, 90d (3.0x12 Synergy DES), AM 90, VG->OM2 known to be 100, LIMA->LAD not injected, patent in 2015.  . Carotid stenosis, right   . Chronic combined systolic and diastolic CHF (congestive heart failure) ([place]   . CKD (chronic kidney disease), stage III ([facility]   . H/O: GI bleed    REMOTE HISTORY  . History of COPD   . Hyperlipidemia   . Hypertensive heart disease   . LBBB (left bundle branch block)   . Pacemaker Sept 2009   St Jude  . PAF (paroxysmal atrial fibrillation) ([facility])    a. not on anticoag due to prior GIB.  . Sick sinus syndrome ([facility] Sept 2009   ST Jude PTVDP  . Stroke ([facility]    Social History   Socioeconomic History  . Marital status: Married    Spouse name: Not on file  . Number of children: 5  . Years of education: Not on file  . Highest education level: Not on file  Occupational History  . Occupation: RETIRED    Employer: RETIRED    Comment: [employer] . Financial resource strain: Not hard at all  . Food insecurity:    Worry: Never true    Inability: Never true  . Transportation needs:    Medical: No    Non-medical: No  Tobacco Use  . Smoking status: Former Smoker    Packs/day: 0.75    Years: 65.00    Pack years: 48.75    Types: Cigarettes    Last attempt to quit: 11/09/1997    Years since quitting: 20.7  . Smokeless tobacco: Former [employer] developer   Quit date: 10/29/2008  Substance and Sexual Activity  . Alcohol use: No  . Drug use: No  . Sexual  activity: Not Currently  Lifestyle  . Physical activity:    Days per week: 0 days    Minutes per session: 0 min  . Stress: Not on file  Relationships  . Social connections:    Talks on phone: Never    Gets together: More than three times a week    Attends religious service: Never    Active member of club or organization: Yes    Attends meetings of clubs or organizations: Never    Relationship status: Married  Other Topics Concern  . Not on file  Social History Narrative  . Not on file   Family History  Problem Relation Age of Onset  . Diabetes Father   . Stroke Sister   . Heart disease Sister   . Heart disease Brother   . Heart attack Brother   . Stroke Brother   . Hypertension Neg Hx    Scheduled Meds: . aspirin EC  81 mg Oral Daily  . atorvastatin  40 mg Oral QPM  . carvedilol  12.5 mg Oral BID WC  . clopidogrel  75 mg Oral Daily  . ezetimibe  10 mg Oral Daily  . ferrous sulfate  325 mg Oral BID WC  . furosemide  60 mg Oral BID  . heparin  5,000 Units Subcutaneous Q8H  . ipratropium-albuterol  3 mL Nebulization TID  . isosorbide mononitrate  90 mg Oral Daily  . [START ON 08/01/2018] latanoprost  1 drop Both Eyes Once per day on Mon Wed  . levothyroxine  125 mcg Oral QAC breakfast  . mouth rinse  15 mL Mouth Rinse BID  . pantoprazole  40 mg Oral Daily  . polyethylene glycol  17 g Oral Daily  . ranolazine  1,000 mg Oral BID  . tamsulosin  0.4 mg Oral Daily   Continuous Infusions: PRN Meds:.acetaminophen, albuterol, ALPRAZolam, nitroGLYCERIN, ondansetron (ZOFRAN) IV, RESOURCE THICKENUP CLEAR, senna No Known Allergies Review of Systems  Constitutional: Positive for activity change and appetite change.  Respiratory: Positive for shortness of breath.   Gastrointestinal: Positive for constipation.  Genitourinary: Positive for flank pain.  Neurological: Positive for weakness.    Physical Exam  Constitutional: He is oriented to person, place, and time. He appears  well-developed. He appears ill.  Frail, elderly   HENT:  Head: Normocephalic and atraumatic.  Cardiovascular: Normal rate.  Paced   Pulmonary/Chest: No accessory muscle usage. No tachypnea.  Mild SOB at rest at times during conversation  Abdominal:  Distended but soft  Neurological: He is alert and oriented to person, place, and time.  Nursing note and vitals reviewed.   Vital Signs: BP (!) 157/72 (BP Location: Right Arm)   Pulse 61   Temp 97.6 F (36.4 C) (Oral)   Resp 18   Ht '5\' 9"'  (1.753 m)   Wt 65.5 kg   SpO2 99%   BMI 21.32 kg/m  Pain Scale: 0-10 POSS *See Group Information*: 1-Acceptable,Awake and alert Pain Score: Asleep   SpO2: SpO2: 99 % O2 Device:SpO2: 99 % O2 Flow Rate: .O2 Flow Rate (L/min): 3 L/min  IO: Intake/output summary:   Intake/Output Summary (Last 24 hours) at 07/31/2018 1330 Last data filed at 07/31/2018 1100 Gross per 24 hour  Intake 936.07 ml  Output 1200 ml  Net -263.93 ml    LBM: Last BM Date: 07/28/18 Baseline Weight: Weight: 63 kg Most recent weight: Weight: 65.5 kg     Palliative Assessment/Data: 40%     Time In: 1330 Time Out: 1500 Time Total: 90 min Greater than 50%  of this time was spent counseling and coordinating care related to the above assessment and plan.  Signed by: Vinie Sill, NP Palliative Medicine Team Pager # [phone] (M-F 8a-5p) Team Phone # [phone] (Nights/Weekends)

## 2018-07-31 NOTE — Progress Notes (Signed)
Pt c/o Sharp pain on Rt. Head- around temple area. Pt has experienced at home but he feels like knocking him down by this pain. Pt had 3-4 times and pain was getting weaker than before. Paging Dr. Roger Shelter regarding this matter and medication ranexa which couldn't crush or cut the medications. Talked to pharmacist regarding this, can change 500 mg pill which is smaller than 1000 mg pill. Awaiting for call. HS [employer]

## 2018-08-01 ENCOUNTER — Telehealth: Payer: Self-pay | Admitting: Cardiology

## 2018-08-01 ENCOUNTER — Observation Stay (HOSPITAL_COMMUNITY): Payer: Medicare HMO

## 2018-08-01 ENCOUNTER — Encounter (HOSPITAL_COMMUNITY): Payer: Self-pay | Admitting: Physician Assistant

## 2018-08-01 ENCOUNTER — Ambulatory Visit: Payer: Medicare HMO | Admitting: *Deleted

## 2018-08-01 DIAGNOSIS — Z66 Do not resuscitate: Secondary | ICD-10-CM | POA: Diagnosis present

## 2018-08-01 DIAGNOSIS — R079 Chest pain, unspecified: Secondary | ICD-10-CM | POA: Diagnosis not present

## 2018-08-01 DIAGNOSIS — Z7951 Long term (current) use of inhaled steroids: Secondary | ICD-10-CM | POA: Diagnosis not present

## 2018-08-01 DIAGNOSIS — I214 Non-ST elevation (NSTEMI) myocardial infarction: Secondary | ICD-10-CM | POA: Diagnosis present

## 2018-08-01 DIAGNOSIS — J9601 Acute respiratory failure with hypoxia: Secondary | ICD-10-CM

## 2018-08-01 DIAGNOSIS — N183 Chronic kidney disease, stage 3 (moderate): Secondary | ICD-10-CM

## 2018-08-01 DIAGNOSIS — I447 Left bundle-branch block, unspecified: Secondary | ICD-10-CM | POA: Diagnosis present

## 2018-08-01 DIAGNOSIS — R41841 Cognitive communication deficit: Secondary | ICD-10-CM | POA: Diagnosis not present

## 2018-08-01 DIAGNOSIS — I480 Paroxysmal atrial fibrillation: Secondary | ICD-10-CM | POA: Diagnosis not present

## 2018-08-01 DIAGNOSIS — I5043 Acute on chronic combined systolic (congestive) and diastolic (congestive) heart failure: Secondary | ICD-10-CM | POA: Diagnosis present

## 2018-08-01 DIAGNOSIS — Z79899 Other long term (current) drug therapy: Secondary | ICD-10-CM | POA: Diagnosis not present

## 2018-08-01 DIAGNOSIS — R109 Unspecified abdominal pain: Secondary | ICD-10-CM | POA: Diagnosis not present

## 2018-08-01 DIAGNOSIS — E785 Hyperlipidemia, unspecified: Secondary | ICD-10-CM | POA: Diagnosis not present

## 2018-08-01 DIAGNOSIS — R4182 Altered mental status, unspecified: Secondary | ICD-10-CM | POA: Diagnosis not present

## 2018-08-01 DIAGNOSIS — J918 Pleural effusion in other conditions classified elsewhere: Secondary | ICD-10-CM | POA: Diagnosis present

## 2018-08-01 DIAGNOSIS — K5730 Diverticulosis of large intestine without perforation or abscess without bleeding: Secondary | ICD-10-CM | POA: Diagnosis not present

## 2018-08-01 DIAGNOSIS — F0390 Unspecified dementia without behavioral disturbance: Secondary | ICD-10-CM | POA: Diagnosis present

## 2018-08-01 DIAGNOSIS — R404 Transient alteration of awareness: Secondary | ICD-10-CM | POA: Diagnosis not present

## 2018-08-01 DIAGNOSIS — I509 Heart failure, unspecified: Secondary | ICD-10-CM | POA: Diagnosis not present

## 2018-08-01 DIAGNOSIS — Z951 Presence of aortocoronary bypass graft: Secondary | ICD-10-CM | POA: Diagnosis not present

## 2018-08-01 DIAGNOSIS — Z950 Presence of cardiac pacemaker: Secondary | ICD-10-CM | POA: Diagnosis not present

## 2018-08-01 DIAGNOSIS — J90 Pleural effusion, not elsewhere classified: Secondary | ICD-10-CM

## 2018-08-01 DIAGNOSIS — Z7189 Other specified counseling: Secondary | ICD-10-CM | POA: Diagnosis not present

## 2018-08-01 DIAGNOSIS — I10 Essential (primary) hypertension: Secondary | ICD-10-CM | POA: Diagnosis not present

## 2018-08-01 DIAGNOSIS — Z7401 Bed confinement status: Secondary | ICD-10-CM | POA: Diagnosis not present

## 2018-08-01 DIAGNOSIS — Z515 Encounter for palliative care: Secondary | ICD-10-CM

## 2018-08-01 DIAGNOSIS — Z87891 Personal history of nicotine dependence: Secondary | ICD-10-CM | POA: Diagnosis not present

## 2018-08-01 DIAGNOSIS — R1310 Dysphagia, unspecified: Secondary | ICD-10-CM | POA: Diagnosis not present

## 2018-08-01 DIAGNOSIS — R1312 Dysphagia, oropharyngeal phase: Secondary | ICD-10-CM | POA: Diagnosis not present

## 2018-08-01 DIAGNOSIS — R293 Abnormal posture: Secondary | ICD-10-CM | POA: Diagnosis not present

## 2018-08-01 DIAGNOSIS — D509 Iron deficiency anemia, unspecified: Secondary | ICD-10-CM | POA: Diagnosis present

## 2018-08-01 DIAGNOSIS — R0781 Pleurodynia: Secondary | ICD-10-CM | POA: Diagnosis not present

## 2018-08-01 DIAGNOSIS — M6281 Muscle weakness (generalized): Secondary | ICD-10-CM | POA: Diagnosis not present

## 2018-08-01 DIAGNOSIS — J449 Chronic obstructive pulmonary disease, unspecified: Secondary | ICD-10-CM | POA: Diagnosis present

## 2018-08-01 DIAGNOSIS — Z862 Personal history of diseases of the blood and blood-forming organs and certain disorders involving the immune mechanism: Secondary | ICD-10-CM | POA: Diagnosis not present

## 2018-08-01 DIAGNOSIS — J948 Other specified pleural conditions: Secondary | ICD-10-CM | POA: Diagnosis not present

## 2018-08-01 DIAGNOSIS — I130 Hypertensive heart and chronic kidney disease with heart failure and stage 1 through stage 4 chronic kidney disease, or unspecified chronic kidney disease: Secondary | ICD-10-CM | POA: Diagnosis present

## 2018-08-01 DIAGNOSIS — E039 Hypothyroidism, unspecified: Secondary | ICD-10-CM | POA: Diagnosis present

## 2018-08-01 DIAGNOSIS — R498 Other voice and resonance disorders: Secondary | ICD-10-CM | POA: Diagnosis not present

## 2018-08-01 DIAGNOSIS — R278 Other lack of coordination: Secondary | ICD-10-CM | POA: Diagnosis not present

## 2018-08-01 DIAGNOSIS — I495 Sick sinus syndrome: Secondary | ICD-10-CM | POA: Diagnosis not present

## 2018-08-01 DIAGNOSIS — I2510 Atherosclerotic heart disease of native coronary artery without angina pectoris: Secondary | ICD-10-CM | POA: Diagnosis present

## 2018-08-01 DIAGNOSIS — N179 Acute kidney failure, unspecified: Secondary | ICD-10-CM | POA: Diagnosis present

## 2018-08-01 DIAGNOSIS — R2689 Other abnormalities of gait and mobility: Secondary | ICD-10-CM | POA: Diagnosis not present

## 2018-08-01 DIAGNOSIS — M2550 Pain in unspecified joint: Secondary | ICD-10-CM | POA: Diagnosis not present

## 2018-08-01 DIAGNOSIS — Z8673 Personal history of transient ischemic attack (TIA), and cerebral infarction without residual deficits: Secondary | ICD-10-CM | POA: Diagnosis not present

## 2018-08-01 HISTORY — PX: IR THORACENTESIS ASP PLEURAL SPACE W/IMG GUIDE: IMG5380

## 2018-08-01 LAB — GLUCOSE, PLEURAL OR PERITONEAL FLUID: Glucose, Fluid: 111 mg/dL

## 2018-08-01 LAB — LACTATE DEHYDROGENASE, PLEURAL OR PERITONEAL FLUID: LD, Fluid: 153 U/L — ABNORMAL HIGH (ref 3–23)

## 2018-08-01 LAB — BASIC METABOLIC PANEL WITH GFR
Anion gap: 10 (ref 5–15)
BUN: 29 mg/dL — ABNORMAL HIGH (ref 8–23)
CO2: 27 mmol/L (ref 22–32)
Calcium: 8.9 mg/dL (ref 8.9–10.3)
Chloride: 100 mmol/L (ref 98–111)
Creatinine, Ser: 2.35 mg/dL — ABNORMAL HIGH (ref 0.61–1.24)
GFR calc Af Amer: 26 mL/min — ABNORMAL LOW
GFR calc non Af Amer: 22 mL/min — ABNORMAL LOW
Glucose, Bld: 118 mg/dL — ABNORMAL HIGH (ref 70–99)
Potassium: 4.6 mmol/L (ref 3.5–5.1)
Sodium: 137 mmol/L (ref 135–145)

## 2018-08-01 LAB — BODY FLUID CELL COUNT WITH DIFFERENTIAL
EOS FL: 1 %
LYMPHS FL: 77 %
Monocyte-Macrophage-Serous Fluid: 17 % — ABNORMAL LOW (ref 50–90)
NEUTROPHIL FLUID: 5 % (ref 0–25)
WBC FLUID: 559 uL (ref 0–1000)

## 2018-08-01 LAB — GRAM STAIN

## 2018-08-01 LAB — LACTATE DEHYDROGENASE: LDH: 364 U/L — AB (ref 98–192)

## 2018-08-01 MED ORDER — LIDOCAINE HCL 1 % IJ SOLN
INTRAMUSCULAR | Status: AC
  Filled 2018-08-01: qty 20

## 2018-08-01 MED ORDER — HYDRALAZINE HCL 20 MG/ML IJ SOLN: 10.0000 mg | Freq: Four times a day (QID) | INTRAMUSCULAR | Status: DC | PRN

## 2018-08-01 MED ORDER — SODIUM CHLORIDE 0.9 % IV BOLUS
500.0000 mL | Freq: Once | INTRAVENOUS | Status: AC
  Administered 2018-08-01: 500 mL via INTRAVENOUS

## 2018-08-01 MED ORDER — LIDOCAINE HCL 1 % IJ SOLN
INTRAMUSCULAR | Status: DC | PRN
  Administered 2018-08-01: 10 mL

## 2018-08-01 NOTE — Procedures (Signed)
PROCEDURE SUMMARY:  Successful image-guided right thoracentesis. Yielded 1.1 liters of amber fluid. Patient tolerated procedure well - procedure was stopped at 1.1L due to patient complaints of chest pain. No immediate complications.  Specimen was sent for labs. Post-procedure CXR showed no pneumothorax.  Please see imaging section in Epic for full dictation.  Joaquim Nam PA-C 08/01/2018 2:25 PM

## 2018-08-01 NOTE — Progress Notes (Signed)
Palliative:  I went to follow up with Mr. Lenahan x 2 but both times he is asleep and no family at bedside. Will await results of thoracentesis to see if this relieves his pain and will monitor renal function. MOST has previously been completed and is on chart (cont hospitalization/interventions, no aggressive/invasive interventions, ok for antibiotics and IVF, no feeding tube). Per my conversation with patient and family yesterday there have been no changes in [place]. I will continue to follow progress and conversations regarding [place].   No charge  Vinie Sill, NP Palliative Medicine Team Pager # [phone] (M-F 8a-5p) Team Phone # [phone] (Nights/Weekends)

## 2018-08-01 NOTE — Progress Notes (Signed)
PROGRESS NOTE    John Peck  KWI:[id number] DOB: 05/28/1924 DOA: 07/29/2018 PCP: Seward Carol, MD      Brief Narrative:  John Peck is a 84 y.o. M with mild dementia, CAD s/p CABG, last PCI 2004, Afib and SSS with pacer, HTN, CKD III, hypothyroidism who presents from SNF for chest pain and dyspnea.  Found to have recurrent effusion, elevated troponin.  Started on Lasix for CHF, heparin for possible NSTEMI and admitted.       Assessment & Plan:  Chest pain This is right-sided chest and abdominal pain, worse with inspiration, associated with dyspnea.  In this context his pleural effusion appears to be worsening, and I think this is the cause.  Chest pain not clinically consistent with angina.  Evaluated by Cardiology who felt his chest pain not consistent with NSTEMI, likely his troponin leak was demand ischemia, heparin stopped, no further ischemic work up planned.     Acute respiratory failure with hypoxia Appears to be from effusion, CHF.  Acute on chronic systolic and diastolic CHF Lasix dosing increased initially, now creatinine worsening. -Hold diuretics -Daily weights, strict I/Os  AKI on CKD III Baseline Cr 1.2.  Now with diuresis, creatinine worsened to 2.35.   -Daily BMP, avoid nephrotoxins/hypotension -Gentle fluids  Pleural effusion, right-sided This appears to be driving dyspnea, hypoxia.  His breathing and pain are now worse. In July he had a right sided effusion, transudative.  Now has recurred. -NPO -US thoracentesis ordered with cytologies, cultures, LDH, protein  Hypertension Coronary disease secondary prevention -Continue carvedilol, Imdur -Continue aspirin, Plavix, atorvastatin, Zetia -Continue Ranexa  Paroxysmal atrial fibrillation CHA2DS2-Vasc 5, not on AC due to hx bleeding. -Continue metoprolol  Hypothyroidism -Continue levothyroxine  Chronic normocytic anemia, likely chronic iron deficiency anemia -Continue iron  Other  medications -Continue eye drops -Continue Flomax    DVT prophylaxis: heparin Code Status: DO NOT RESUSCITATE Family Communication: Daughter at bedside MDM and disposition Plan: The below labs and imaging reports were reviewed and summarized above.    The patient was admitted with chest pain, found to have hypoxia, found to have pleural effusion.  Initially treated with heparin for suspected NSTEMI, cardiology now suspect this was rather demand ischemia.  In meantime, pain and dyspnea and hypoxia are worse, as is renal function.  Will Give gentle fluids, hold diuretics.  Obtain thoracentesis.      Consultants:   Cardiology  Palliative care  Procedures:   Thoracentesis 9/23  Antimicrobials:   None    Subjective: Feels pain in right chest and abdomen worse with inspiartion.  No fever, no sputum, no cough.  No confusion.  No dyspnea or pain with exertion.  No vomiting.  Objective: Vitals:   08/01/18 0620 08/01/18 0644 08/01/18 0733 08/01/18 0809  BP: (!) 172/69 (!) 165/79  (!) 179/69  Pulse:    (!) 58  Resp:      Temp:    98.2 F (36.8 C)  TempSrc:    Oral  SpO2:   100% 95%  Weight:      Height:        Intake/Output Summary (Last 24 hours) at 08/01/2018 1539 Last data filed at 08/01/2018 0400 Gross per 24 hour  Intake -  Output 550 ml  Net -550 ml   Filed Weights   07/29/18 1300 07/29/18 1900 07/30/18 0500  Weight: 63 kg 62.6 kg 65.5 kg    Examination: General appearance: Elderly adult male, alert and in no acute distress.  Lying in  bed HEENT: Anicteric, conjunctiva pink, lids and lashes normal. No nasal deformity, discharge, epistaxis.  Lips moist, OP moist, no oral lesions, hearing diminshed.   Skin: Warm and dry.  No jaundice.  No suspicious rashes or lesions. Cardiac: RRR, nl S1-S2, no murmurs appreciated.  Capillary refill is brisk.  JVP normal.  No LE edema.  Radia  pulses 2+ and symmetric. Respiratory: Normal respiratory rate and rhythm.  CTAB  without rales or wheezes.  Diminished on right Abdomen: Abdomen soft.  Moderate right TTP, with voluntary guarding. No ascites, distension, hepatosplenomegaly.   MSK: No deformities or effusions in large joints of arms or legs, no clubbing. Neuro: Awake and alert.  EOMI, moves all extremities. Speech fluent.    Psych: Sensorium intact and responding to questions, attention normal. Affect blunted.  Judgment and insight appear normal.    Data Reviewed: I have personally reviewed following labs and imaging studies:  CBC: Recent Labs  Lab 07/29/18 1119 07/30/18 0625 07/31/18 0246  WBC 4.3 4.6 5.2  NEUTROABS 2.4  --   --   HGB 9.1* 10.4* 9.7*  HCT 28.6* 31.6* 29.0*  MCV 100.0 98.1 96.7  PLT 127* 138* 517*   Basic Metabolic Panel: Recent Labs  Lab 07/29/18 1119 07/31/18 0246 08/01/18 0604  NA 140 137 137  K 4.1 4.3 4.6  CL 106 102 100  CO2 27 24 27   GLUCOSE 104* 109* 118*  BUN 25* 27* 29*  CREATININE 1.31* 2.21* 2.35*  CALCIUM 8.5* 8.6* 8.9   GFR: Estimated Creatinine Clearance: 18.6 mL/min (A) (by C-G formula based on SCr of 2.35 mg/dL (H)). Liver Function Tests: No results for input(s): AST, ALT, ALKPHOS, BILITOT, PROT, ALBUMIN in the last 168 hours. No results for input(s): LIPASE, AMYLASE in the last 168 hours. No results for input(s): AMMONIA in the last 168 hours. Coagulation Profile: No results for input(s): INR, PROTIME in the last 168 hours. Cardiac Enzymes: Recent Labs  Lab 07/29/18 1552 07/29/18 2257  TROPONINI 0.25* 0.22*   BNP (last 3 results) No results for input(s): PROBNP in the last 8760 hours. HbA1C: No results for input(s): HGBA1C in the last 72 hours. CBG: No results for input(s): GLUCAP in the last 168 hours. Lipid Profile: No results for input(s): CHOL, HDL, LDLCALC, TRIG, CHOLHDL, LDLDIRECT in the last 72 hours. Thyroid Function Tests: No results for input(s): TSH, T4TOTAL, FREET4, T3FREE, THYROIDAB in the last 72 hours. Anemia  Panel: No results for input(s): VITAMINB12, FOLATE, FERRITIN, TIBC, IRON, RETICCTPCT in the last 72 hours. Urine analysis:    Component Value Date/Time   COLORURINE YELLOW 07/29/2018 [place] 07/29/2018 1149   LABSPEC 1.011 07/29/2018 1149   PHURINE 5.0 07/29/2018 1149   GLUCOSEU NEGATIVE 07/29/2018 1149   HGBUR NEGATIVE 07/29/2018 1149   BILIRUBINUR NEGATIVE 07/29/2018 1149   KETONESUR NEGATIVE 07/29/2018 1149   PROTEINUR NEGATIVE 07/29/2018 1149   UROBILINOGEN 0.2 05/09/2015 1523   NITRITE NEGATIVE 07/29/2018 1149   LEUKOCYTESUR NEGATIVE 07/29/2018 1149   Sepsis Labs: @LABRCNTIP (procalcitonin:4,lacticacidven:4)  ) Recent Results (from the past 240 hour(s))  MRSA PCR Screening     Status: None   Collection Time: 07/29/18  7:38 PM  Result Value Ref Range Status   MRSA by PCR NEGATIVE NEGATIVE Final    Comment:        The GeneXpert MRSA Assay (FDA approved for NASAL specimens only), is one component of a comprehensive MRSA colonization surveillance program. It is not intended to diagnose MRSA infection nor to  guide or monitor treatment for MRSA infections. Performed at [facility], [place] [address], [place], [place] [address]          Radiology Studies: Ct Abdomen Pelvis Wo Contrast  Result Date: 08/01/2018 CLINICAL DATA:  84 year old male with right flank pain. EXAM: CT ABDOMEN AND PELVIS WITHOUT CONTRAST TECHNIQUE: Multidetector CT imaging of the abdomen and pelvis was performed following the standard protocol without IV contrast. COMPARISON:  CT of the abdomen pelvis dated 08/10/2006 FINDINGS: Evaluation of this exam is limited in the absence of intravenous contrast. Lower chest: Partially visualized moderate to large right pleural effusion with compressive atelectasis of the majority of the visualized right lower lobe and partial compressive atelectasis of the right middle lobe. There is a trace left pleural effusion. Diffuse interstitial and  interlobular septal prominence likely edema. There is borderline cardiomegaly and coronary vascular calcification. An AICD wire is partially seen. No intra-abdominal free air or free fluid. Hepatobiliary: The liver is unremarkable. There is a stone in the gallbladder neck. No pericholecystic fluid. Ultrasound may provide better evaluation of the gallbladder. Pancreas: Unremarkable. No pancreatic ductal dilatation or surrounding inflammatory changes. Spleen: Normal in size without focal abnormality. Adrenals/Urinary Tract: The adrenal glands are unremarkable. Indeterminate bilobed or adjacent hypodense lesion in the inferior pole of the right kidney measuring approximately 3.7 x 2.3 cm. Further characterization with ultrasound or MRI on a nonemergent basis recommended. The left kidney is unremarkable. There is no hydronephrosis or nephrolithiasis on either side. The urinary bladder is grossly unremarkable for the degree of distention. Stomach/Bowel: There is sigmoid diverticulosis without active inflammatory changes. Large amount of stool noted in the ascending and transverse colon. No bowel obstruction or active inflammation. The appendix is not visualized with certainty. No inflammatory changes identified in the right lower quadrant. Vascular/Lymphatic: There is advanced aortoiliac atherosclerotic disease. There is a fusiform infrarenal abdominal aortic aneurysm measuring 3.5 cm in diameter. Evaluation of the vasculature is limited on this noncontrast CT. No portal venous gas. There is no adenopathy. Reproductive: The prostate and seminal vesicles are grossly unremarkable. Other: None Musculoskeletal: Osteopenia with degenerative changes of the spine. No acute osseous pathology. IMPRESSION: 1. Stone within the gallbladder neck. Correlation with ultrasound recommended. 2. No hydronephrosis or nephrolithiasis. Right renal inferior pole hypodense lesions, indeterminate. Further evaluation with MRI on a nonemergent  basis recommended. 3. Sigmoid diverticulosis. Moderate colonic stool burden. No bowel obstruction. 4. Large right pleural effusion with associated compressive atelectasis of the right lung base. Electronically Signed   By: Anner Crete M.D.   On: 08/01/2018 01:44   Dg Chest 1 View  Result Date: 08/01/2018 CLINICAL DATA:  Post  thora  Rt side EXAM: CHEST  1 VIEW COMPARISON:  07/31/2018 FINDINGS: Status post median sternotomy. Patient has a LEFT-sided transvenous pacemaker with leads to the RIGHT atrium and RIGHT ventricle. The heart is mildly enlarged. The RIGHT sided pleural effusion slightly smaller with no evidence for postprocedure pneumothorax. LEFT lung remains clear. IMPRESSION: Smaller RIGHT pleural effusion.  No pneumothorax. Electronically Signed   By: Nolon Nations M.D.   On: 08/01/2018 14:45   Dg Ribs Bilateral W/chest  Result Date: 07/31/2018 CLINICAL DATA:  Right rib pain EXAM: BILATERAL RIBS AND CHEST - 4+ VIEW COMPARISON:  Chest radiographs dated 07/29/2018 FINDINGS: Moderate right pleural effusion. Trace left pleural effusion. No frank interstitial edema. No pneumothorax. Cardiomegaly.  Left subclavian pacemaker. No displaced rib fracture is seen. IMPRESSION: Moderate right pleural effusion.  Trace left pleural effusion. No  displaced rib fracture is seen. Electronically Signed   By: Julian Hy M.D.   On: 07/31/2018 09:32   Ir Thoracentesis Asp Pleural Space W/img Guide  Result Date: 08/01/2018 INDICATION: History of COPD, recurrent right sided pleural effusion with dyspnea requiring continuous oxygen via [place]. Request for diagnostic and therapeutic thoracentesis today. EXAM: ULTRASOUND GUIDED RIGHT THORACENTESIS MEDICATIONS: 10 mL 1% lidocaine. COMPLICATIONS: None immediate. PROCEDURE: An ultrasound guided thoracentesis was thoroughly discussed with the patient and questions answered. The benefits, risks, alternatives and complications were also discussed. The patient  understands and wishes to proceed with the procedure. Written consent was obtained. Ultrasound was performed to localize and mark an adequate pocket of fluid in the right chest. The area was then prepped and draped in the normal sterile fashion. 1% Lidocaine was used for local anesthesia. Under ultrasound guidance a 6 Fr Safe-T-Centesis catheter was introduced. Thoracentesis was performed. The catheter was removed and a dressing applied. FINDINGS: A total of approximately 1.1L of amber fluid was removed. Samples were sent to the laboratory as requested by the clinical team. IMPRESSION: Successful ultrasound guided right thoracentesis yielding 1.1 L of pleural fluid. Read by Candiss Norse, PA-C Electronically Signed   By: Jacqulynn Cadet M.D.   On: 08/01/2018 14:53        Scheduled Meds: . aspirin EC  81 mg Oral Daily  . atorvastatin  40 mg Oral QPM  . carvedilol  12.5 mg Oral BID WC  . clopidogrel  75 mg Oral Daily  . ezetimibe  10 mg Oral Daily  . ferrous sulfate  325 mg Oral BID WC  . heparin  5,000 Units Subcutaneous Q8H  . ipratropium-albuterol  3 mL Nebulization TID  . isosorbide mononitrate  90 mg Oral Daily  . latanoprost  1 drop Both Eyes Once per day on Mon Wed  . levothyroxine  125 mcg Oral QAC breakfast  . lidocaine  1 patch Transdermal Q24H  . lidocaine      . mouth rinse  15 mL Mouth Rinse BID  . pantoprazole  40 mg Oral Daily  . polyethylene glycol  17 g Oral Daily  . ranolazine  1,000 mg Oral BID  . tamsulosin  0.4 mg Oral Daily   Continuous Infusions:   LOS: 0 days    Time spent: 35 minuets    Edwin Dada, MD [facility] 08/01/2018, 3:39 PM     Pager [phone] --- please page though AMION:  [email or web address] Password [email or web address] If 7PM-7AM, please contact night-coverage

## 2018-08-01 NOTE — Telephone Encounter (Signed)
Confirmed remote transmission w/ pt daughter.   

## 2018-08-01 NOTE — Progress Notes (Signed)
RT NOTES: Instructed patient on incentive spirometer. Patient achieved 500ccs with good effort. Will continue to monitor.

## 2018-08-02 ENCOUNTER — Encounter: Payer: Self-pay | Admitting: Cardiology

## 2018-08-02 ENCOUNTER — Ambulatory Visit (INDEPENDENT_AMBULATORY_CARE_PROVIDER_SITE_OTHER): Payer: Medicare HMO | Admitting: *Deleted

## 2018-08-02 DIAGNOSIS — Z862 Personal history of diseases of the blood and blood-forming organs and certain disorders involving the immune mechanism: Secondary | ICD-10-CM

## 2018-08-02 DIAGNOSIS — R1310 Dysphagia, unspecified: Secondary | ICD-10-CM

## 2018-08-02 DIAGNOSIS — R109 Unspecified abdominal pain: Secondary | ICD-10-CM | POA: Diagnosis not present

## 2018-08-02 DIAGNOSIS — E785 Hyperlipidemia, unspecified: Secondary | ICD-10-CM

## 2018-08-02 DIAGNOSIS — I495 Sick sinus syndrome: Secondary | ICD-10-CM | POA: Diagnosis not present

## 2018-08-02 DIAGNOSIS — R498 Other voice and resonance disorders: Secondary | ICD-10-CM | POA: Diagnosis not present

## 2018-08-02 DIAGNOSIS — E039 Hypothyroidism, unspecified: Secondary | ICD-10-CM

## 2018-08-02 DIAGNOSIS — R278 Other lack of coordination: Secondary | ICD-10-CM | POA: Diagnosis not present

## 2018-08-02 DIAGNOSIS — M6281 Muscle weakness (generalized): Secondary | ICD-10-CM | POA: Diagnosis not present

## 2018-08-02 DIAGNOSIS — I214 Non-ST elevation (NSTEMI) myocardial infarction: Secondary | ICD-10-CM | POA: Diagnosis not present

## 2018-08-02 DIAGNOSIS — R1312 Dysphagia, oropharyngeal phase: Secondary | ICD-10-CM | POA: Diagnosis not present

## 2018-08-02 DIAGNOSIS — R41841 Cognitive communication deficit: Secondary | ICD-10-CM | POA: Diagnosis not present

## 2018-08-02 DIAGNOSIS — R293 Abnormal posture: Secondary | ICD-10-CM | POA: Diagnosis not present

## 2018-08-02 DIAGNOSIS — R2689 Other abnormalities of gait and mobility: Secondary | ICD-10-CM | POA: Diagnosis not present

## 2018-08-02 DIAGNOSIS — R0781 Pleurodynia: Secondary | ICD-10-CM | POA: Diagnosis not present

## 2018-08-02 LAB — BASIC METABOLIC PANEL
ANION GAP: 11 (ref 5–15)
BUN: 29 mg/dL — ABNORMAL HIGH (ref 8–23)
CALCIUM: 8.3 mg/dL — AB (ref 8.9–10.3)
CO2: 23 mmol/L (ref 22–32)
CREATININE: 2.12 mg/dL — AB (ref 0.61–1.24)
Chloride: 104 mmol/L (ref 98–111)
GFR calc Af Amer: 29 mL/min — ABNORMAL LOW (ref >60)
GFR calc non Af Amer: 25 mL/min — ABNORMAL LOW (ref >60)
Glucose, Bld: 88 mg/dL (ref 70–99)
Potassium: 5.5 mmol/L — ABNORMAL HIGH (ref 3.5–5.1)
Sodium: 138 mmol/L (ref 135–145)

## 2018-08-02 LAB — CBC
HCT: 30.1 % — ABNORMAL LOW (ref 39.0–52.0)
Hemoglobin: 9.9 g/dL — ABNORMAL LOW (ref 13.0–17.0)
MCH: 31.8 pg (ref 26.0–34.0)
MCHC: 32.9 g/dL (ref 30.0–36.0)
MCV: 96.8 fL (ref 78.0–100.0)
Platelets: 127 10*3/uL — ABNORMAL LOW (ref 150–400)
RBC: 3.11 MIL/uL — ABNORMAL LOW (ref 4.22–5.81)
RDW: 14.4 % (ref 11.5–15.5)
WBC: 5.4 10*3/uL (ref 4.0–10.5)

## 2018-08-02 LAB — POTASSIUM: POTASSIUM: 4.4 mmol/L (ref 3.5–5.1)

## 2018-08-02 MED ORDER — PRO-STAT SUGAR FREE PO LIQD: 30.0000 mL | Freq: Three times a day (TID) | ORAL | 0 refills | Status: DC

## 2018-08-02 MED ORDER — ENSURE COMPLETE PO LIQD: 237.0000 mL | Freq: Two times a day (BID) | ORAL | 0 refills | Status: DC

## 2018-08-02 MED ORDER — RANOLAZINE ER 500 MG PO TB12: 500.0000 mg | ORAL_TABLET | Freq: Two times a day (BID) | ORAL | 0 refills | Status: DC

## 2018-08-02 NOTE — Progress Notes (Signed)
Palliative:  I met again today with John Peck. His wife and granddaughter are at bedside. He is not happy with his diet (understandably with pureed diet nectar). We discussed [place] [employer] and trial and error of pureed, nectar diet of finding things that he likes. I also educated regarding aspiration risk and that it is always their choice as a family to have regular diet BUT this would be more of a comfort to him accepting risks and focusing more on comfort. I do not feel that Mr. Dunford understood this exactly. We discussed follow up with SLP and palliative care to see if he can upgrade his diet to something more tolerable to him or transition to more comfort focused care. They can continue working with patient and family to make a plan of care that coordinates with his [place]. Renal function is a little better today. Hopefully renal function will continue to improve on his home dose Lasix. All questions/concerns addressed. Emotional support provided.   25 min  Vinie Sill, NP Palliative Medicine Team Pager # [phone] (M-F 8a-5p) Team Phone # [phone] (Nights/Weekends)

## 2018-08-02 NOTE — Discharge Instructions (Addendum)
Aspirin and Your Heart  Aspirin is a medicine that affects the way blood clots. Aspirin can be used to help reduce the risk of blood clots, heart attacks, and other heart-related problems.  Should I take aspirin?  Your health care provider will help you determine whether it is safe and beneficial for you to take aspirin daily. Taking aspirin daily may be beneficial if you:   Have had a heart attack or chest pain.   Have undergone open heart surgery such as coronary artery bypass surgery (CABG).   Have had coronary angioplasty.   Have experienced a stroke or transient ischemic attack (TIA).   Have peripheral vascular disease (PVD).   Have chronic heart rhythm problems such as atrial fibrillation.    Are there any risks of taking aspirin daily?  Daily use of aspirin can increase your risk of side effects. Some of these include:   Bleeding. Bleeding problems can be minor or serious. An example of a minor problem is a cut that does not stop bleeding. An example of a more serious problem is stomach bleeding or bleeding into the brain. Your risk of bleeding is increased if you are also taking non-steroidal anti-inflammatory medicine (NSAIDs).   Increased bruising.   Upset stomach.   An allergic reaction. People who have nasal polyps have an increased risk of developing an aspirin allergy.    What are some guidelines I should follow when taking aspirin?   Take aspirin only as directed by your health care provider. Make sure you understand how much you should take and what form you should take. The two forms of aspirin are:  ? Non-enteric-coated. This type of aspirin does not have a coating and is absorbed quickly. Non-enteric-coated aspirin is usually recommended for people with chest pain. This type of aspirin also comes in a chewable form.  ? Enteric-coated. This type of aspirin has a special coating that releases the medicine very slowly. Enteric-coated aspirin causes less stomach upset than  non-enteric-coated aspirin. This type of aspirin should not be chewed or crushed.   Drink alcohol in moderation. Drinking alcohol increases your risk of bleeding.  When should I seek medical care?   You have unusual bleeding or bruising.   You have stomach pain.   You have an allergic reaction. Symptoms of an allergic reaction include:  ? Hives.  ? Itchy skin.  ? Swelling of the lips, tongue, or face.   You have ringing in your ears.  When should I seek immediate medical care?   Your bowel movements are bloody, dark red, or black in color.   You vomit or cough up blood.   You have blood in your urine.   You cough, wheeze, or feel short of breath.  If you have any of the following symptoms, this is an emergency. Do not wait to see if the pain will go away. Get medical help at once. Call your local emergency services (911 in the [place] Do not drive yourself to the hospital.   You have severe chest pain, especially if the pain is crushing or pressure-like and spreads to the arms, back, neck, or jaw.   You have stroke-like symptoms, such as:  ? Loss of vision.  ? Difficulty talking.  ? Numbness or weakness on one side of your body.  ? Numbness or weakness in your arm or leg.  ? Not thinking clearly or feeling confused.    This information is not intended to replace advice given to you   by your health care provider. Make sure you discuss any questions you have with your health care provider.  Document Released: 10/08/2008 Document Revised: 03/04/2016 Document Reviewed: 01/31/2014  [employer] Interactive Patient Education  2018 [employer]

## 2018-08-02 NOTE — Discharge Summary (Signed)
Physician Discharge Summary  John Peck BDZ:[id number] DOB: [birth year] DOA: 07/29/2018  PCP: John Carol, MD  Admit date: 07/29/2018 Discharge date: 08/02/2018  Admitted From: [facility]  Disposition:  [place]   Recommendations for Outpatient Follow-up:  1. Please obtain BMP in 2 days (Thursday) and restart Lasix if creatinine normal 2. If creatinine still elevated from baseline, hold Lasix and repeat BMP on Monday 3. Obtain weights daily; if weight gain >3lbs in 1 day or 5 lbs from dry weight, increase Lasix and call Cardiology office 4. Repeat 2V CXR in 4-6 weeks to re-evaluate effusion and eval for malignancy 5. Please consult Palliative Care to follow at [facility] 6. Please follow up on the following tests: 1. Pleural fluid cytology 2. Pleural fluid culture      Home Health: N/A  Equipment/Devices: TBD at SNF  Discharge Condition: Fair  CODE STATUS: DO NOT RESUSCITATE Diet recommendation: Cardiac, low sodium; dysphagia 1 consistency, nectar-thick liquids  Brief/Interim Summary: John Peck is a 84 y.o. M with mild dementia, CAD s/p CABG, last PCI 2004, Afib and SSS with pacer, HTN, CKD III, hypothyroidism who presents from SNF for chest pain and dyspnea.  Found to have recurrent effusion, elevated troponin.  Started on Lasix for CHF, heparin for possible NSTEMI and admitted.     Discharge Diagnoses:   Chest pain This was right-sided chest and abdominal pain, worse with inspiration, associated with dyspnea.   Chest pain not clinically consistent with angina.  Evaluated by Cardiology who felt his chest pain not consistent with NSTEMI, likely his troponin leak was demand ischemia, heparin stopped, no further ischemic work up planned.     Acute respiratory failure with hypoxia This appeared to be from effusion, CHF.  After thoracentesis, patient weaned off O2, chest pain improved.  Acute on chronic systolic and diastolic CHF Initially admitted with  increase in Lasix dosing.  This resulted in creatinine worsening.  Diuretics held.  Creatinine improved. -Weight daily -Obtain BMP in 2 days -Restart Lasix and adjust dose based on creatinine and daily weights    AKI on CKD III Baseline Cr 1.2.  Now with diuresis, creatinine worsened to 2.35.  Today improved.   Pleural effusion, right-sided This appears to be the main driver of dyspnea, hypoxia.  This is a recurrent effusion since July, when he had a transudative effusion, negative for malignant cells.  This time, the effusion is borderline exudative, although I have a high suspicion it is again from CHF. -Follow culture, cytologies  Hypertension Coronary disease secondary prevention  Paroxysmal atrial fibrillation CHA2DS2-Vasc 5, not on AC due to hx bleeding. Rates controlled here.  Hypothyroidism  Chronic normocytic anemia, likely chronic iron deficiency anemia        Discharge Instructions  Discharge Instructions    Diet - low sodium heart healthy   Complete by:  As directed    Discharge instructions   Complete by:  As directed    From Dr. Loleta Peck: You were admitted for chest pain and evidence of heart injury. Based on your lab work and additional testing, our heart specialists believe this injury was not from a heart attack, but from strain on the heart caused by congestive heart failure and possibly the pleural effusion.  Pleural effusions are caused primarily by infection/pneumonia, congestive heart failure or cancer. There is no signs of infection so far, although we sent additional tests to confirm.  Likely, there is no evidence at this time of cancer in the lung, although we  also sent this specimen of fluid from the lung for testing for abnormal cells.  I suspect this effusion was excess fluid from congestive heart failure. It may reaccumulate, although I hope not.    PLEASE OBTAIN A 2-view CHEST x-ray in 4-6 weeks.   With regard to kidney function, this  got worse because of aggressive diuresis.  We backed off, and it has started to improve today.  Hold your Lasix for the next few days. Please have John Peck check your kidney function on Thursday.  If back to normal, restart Lasix at 40 mg once daily. If still not back to baseline, repeat kidney function test on Monday.  Weigh yourself daily and report any weight gain in excess of 5 lbs to your doctor.   Also, for now, reduce the dose of Ranexa to 500 mg twice daily. When the kidney function returns to normal, can resume 1000 mg twice daily.   Increase activity slowly   Complete by:  As directed      Allergies as of 08/02/2018   No Known Allergies     Medication List    STOP taking these medications   furosemide 20 MG tablet Commonly known as:  LASIX     TAKE these medications   acetaminophen 325 MG tablet Commonly known as:  TYLENOL Take 650 mg by mouth 3 (three) times daily.   albuterol 108 (90 Base) MCG/ACT inhaler Commonly known as:  PROVENTIL HFA;VENTOLIN HFA Inhale 2 puffs into the lungs every 4 (four) hours as needed for wheezing or shortness of breath (or coughing).   ALPRAZolam 0.25 MG tablet Commonly known as:  XANAX Take 1 tablet (0.25 mg total) by mouth 2 (two) times daily as needed for anxiety.   aspirin 81 MG tablet Take 81 mg by mouth daily.   atorvastatin 40 MG tablet Commonly known as:  LIPITOR Take 1 tablet (40 mg total) by mouth daily. What changed:  when to take this   carvedilol 12.5 MG tablet Commonly known as:  COREG Take 1 tablet (12.5 mg total) by mouth 2 (two) times daily with a meal.   clopidogrel 75 MG tablet Commonly known as:  PLAVIX Take 1 tablet (75 mg total) by mouth daily.   ezetimibe 10 MG tablet Commonly known as:  ZETIA Take 10 mg by mouth daily.   feeding supplement (ENSURE COMPLETE) Liqd Take 237 mLs by mouth 2 (two) times daily between meals.   feeding supplement (PRO-STAT SUGAR FREE 64) Liqd Take 30 mLs by mouth  3 (three) times daily with meals.   ferrous sulfate 325 (65 FE) MG tablet Take 1 tablet (325 mg total) by mouth 2 (two) times daily with a meal.   ipratropium-albuterol 0.5-2.5 (3) MG/3ML Soln Commonly known as:  DUONEB Take 3 mLs by nebulization 2 (two) times daily. What changed:  when to take this   isosorbide mononitrate 30 MG 24 hr tablet Commonly known as:  IMDUR Take 3 tablets (90 mg total) by mouth daily.   latanoprost 0.005 % ophthalmic solution Commonly known as:  XALATAN Place 1 drop into both eyes 2 (two) times a week. On mondays and wednesdays   levothyroxine 125 MCG tablet Commonly known as:  SYNTHROID, LEVOTHROID TAKE 1 TABLET BY MOUTH EVERY DAY BEFORE BREAKFAST What changed:  See the new instructions.   nitroGLYCERIN 0.4 MG SL tablet Commonly known as:  NITROSTAT Place 1 tablet (0.4 mg total) under the tongue every 5 (five) minutes as needed for chest pain.  pantoprazole 40 MG tablet Commonly known as:  PROTONIX Take 1 tablet (40 mg total) by mouth daily.   polyethylene glycol packet Commonly known as:  MIRALAX / GLYCOLAX Take 17 g by mouth daily.   ranolazine 500 MG 12 hr tablet Commonly known as:  RANEXA Take 1 tablet (500 mg total) by mouth 2 (two) times daily. What changed:    medication strength  how much to take   senna 8.6 MG tablet Commonly known as:  SENOKOT Take 2 tablets by mouth 2 (two) times daily as needed for constipation.   tamsulosin 0.4 MG Caps capsule Commonly known as:  FLOMAX Take 1 capsule (0.4 mg total) by mouth daily.       No Known Allergies  Consultations:  Cardiology  Interventional Radiology   Procedures/Studies: Ct Abdomen Pelvis Wo Contrast  Result Date: 08/01/2018 CLINICAL DATA:  84 year old male with right flank pain. EXAM: CT ABDOMEN AND PELVIS WITHOUT CONTRAST TECHNIQUE: Multidetector CT imaging of the abdomen and pelvis was performed following the standard protocol without IV contrast. COMPARISON:   CT of the abdomen pelvis dated 08/10/2006 FINDINGS: Evaluation of this exam is limited in the absence of intravenous contrast. Lower chest: Partially visualized moderate to large right pleural effusion with compressive atelectasis of the majority of the visualized right lower lobe and partial compressive atelectasis of the right middle lobe. There is a trace left pleural effusion. Diffuse interstitial and interlobular septal prominence likely edema. There is borderline cardiomegaly and coronary vascular calcification. An AICD wire is partially seen. No intra-abdominal free air or free fluid. Hepatobiliary: The liver is unremarkable. There is a stone in the gallbladder neck. No pericholecystic fluid. Ultrasound may provide better evaluation of the gallbladder. Pancreas: Unremarkable. No pancreatic ductal dilatation or surrounding inflammatory changes. Spleen: Normal in size without focal abnormality. Adrenals/Urinary Tract: The adrenal glands are unremarkable. Indeterminate bilobed or adjacent hypodense lesion in the inferior pole of the right kidney measuring approximately 3.7 x 2.3 cm. Further characterization with ultrasound or MRI on a nonemergent basis recommended. The left kidney is unremarkable. There is no hydronephrosis or nephrolithiasis on either side. The urinary bladder is grossly unremarkable for the degree of distention. Stomach/Bowel: There is sigmoid diverticulosis without active inflammatory changes. Large amount of stool noted in the ascending and transverse colon. No bowel obstruction or active inflammation. The appendix is not visualized with certainty. No inflammatory changes identified in the right lower quadrant. Vascular/Lymphatic: There is advanced aortoiliac atherosclerotic disease. There is a fusiform infrarenal abdominal aortic aneurysm measuring 3.5 cm in diameter. Evaluation of the vasculature is limited on this noncontrast CT. No portal venous gas. There is no adenopathy. Reproductive:  The prostate and seminal vesicles are grossly unremarkable. Other: None Musculoskeletal: Osteopenia with degenerative changes of the spine. No acute osseous pathology. IMPRESSION: 1. Stone within the gallbladder neck. Correlation with ultrasound recommended. 2. No hydronephrosis or nephrolithiasis. Right renal inferior pole hypodense lesions, indeterminate. Further evaluation with MRI on a nonemergent basis recommended. 3. Sigmoid diverticulosis. Moderate colonic stool burden. No bowel obstruction. 4. Large right pleural effusion with associated compressive atelectasis of the right lung base. Electronically Signed   By: Anner Crete M.D.   On: 08/01/2018 01:44   Dg Chest 1 View  Result Date: 08/01/2018 CLINICAL DATA:  Post  thora  Rt side EXAM: CHEST  1 VIEW COMPARISON:  07/31/2018 FINDINGS: Status post median sternotomy. Patient has a LEFT-sided transvenous pacemaker with leads to the RIGHT atrium and RIGHT ventricle. The heart is mildly enlarged.  The RIGHT sided pleural effusion slightly smaller with no evidence for postprocedure pneumothorax. LEFT lung remains clear. IMPRESSION: Smaller RIGHT pleural effusion.  No pneumothorax. Electronically Signed   By: Nolon Nations M.D.   On: 08/01/2018 14:45   Dg Chest 2 View  Result Date: 07/29/2018 CLINICAL DATA:  Shortness of breath and chest pain EXAM: CHEST - 2 VIEW COMPARISON:  July 02, 2018 FINDINGS: There is a pleural effusion on the right with atelectatic change in the right mid and lower lung zones. There is no frank consolidation. Heart is upper normal in size with pulmonary vascularity normal. Pacemaker leads are attached to the right atrium and right ventricle. Patient is status post internal mammary bypass grafting. No adenopathy. No bone lesions. IMPRESSION: Moderate right pleural effusion with right mid and lower lung zone atelectatic change. No frank consolidation. Stable cardiac silhouette. Pacemaker leads attached to right atrium and right  ventricle. No pneumothorax. Electronically Signed   By: Lowella Grip III M.D.   On: 07/29/2018 12:05   Dg Ribs Bilateral W/chest  Result Date: 07/31/2018 CLINICAL DATA:  Right rib pain EXAM: BILATERAL RIBS AND CHEST - 4+ VIEW COMPARISON:  Chest radiographs dated 07/29/2018 FINDINGS: Moderate right pleural effusion. Trace left pleural effusion. No frank interstitial edema. No pneumothorax. Cardiomegaly.  Left subclavian pacemaker. No displaced rib fracture is seen. IMPRESSION: Moderate right pleural effusion.  Trace left pleural effusion. No displaced rib fracture is seen. Electronically Signed   By: Julian Hy M.D.   On: 07/31/2018 09:32   Ir Thoracentesis Asp Pleural Space W/img Guide  Result Date: 08/01/2018 INDICATION: History of COPD, recurrent right sided pleural effusion with dyspnea requiring continuous oxygen via [place]. Request for diagnostic and therapeutic thoracentesis today. EXAM: ULTRASOUND GUIDED RIGHT THORACENTESIS MEDICATIONS: 10 mL 1% lidocaine. COMPLICATIONS: None immediate. PROCEDURE: An ultrasound guided thoracentesis was thoroughly discussed with the patient and questions answered. The benefits, risks, alternatives and complications were also discussed. The patient understands and wishes to proceed with the procedure. Written consent was obtained. Ultrasound was performed to localize and mark an adequate pocket of fluid in the right chest. The area was then prepped and draped in the normal sterile fashion. 1% Lidocaine was used for local anesthesia. Under ultrasound guidance a 6 Fr Safe-T-Centesis catheter was introduced. Thoracentesis was performed. The catheter was removed and a dressing applied. FINDINGS: A total of approximately 1.1L of amber fluid was removed. Samples were sent to the laboratory as requested by the clinical team. IMPRESSION: Successful ultrasound guided right thoracentesis yielding 1.1 L of pleural fluid. Read by Candiss Norse, PA-C Electronically  Signed   By: Jacqulynn Cadet M.D.   On: 08/01/2018 14:53      Subjective: Feeling well.  Chest pain resolved, but still shooting pain with deep breathing.  No dyspnea on exertion.  No swelling or orthopnea.  No exertional chest symptoms.  NO confusion, fever, sputum, cough.  Discharge Exam: Vitals:   08/02/18 0351 08/02/18 0736  BP: 130/61   Pulse: (!) 58   Resp: 16   Temp: 98 F (36.7 C)   SpO2: 100% 100%   Vitals:   08/01/18 1601 08/01/18 2151 08/02/18 0351 08/02/18 0736  BP: 134/76  130/61   Pulse: 63  (!) 58   Resp:   16   Temp: 98.1 F (36.7 C)  98 F (36.7 C)   TempSrc: Oral  Oral   SpO2: 100% 99% 100% 100%  Weight:   62.2 kg   Height:  General: Pt is alert, awake, not in acute distress, sitting in recliner Cardiovascular: RRR, S2/G3 +, soft systolic murmur, no rubs, no gallops Respiratory: CTA bilaterally, no wheezing, no rhonchi Abdominal: Soft, NT, ND, bowel sounds + Extremities: no edema, no cyanosis Neuro: Slowed and slightly HOH, but oriented, moves all extremities, interactive    The results of significant diagnostics from this hospitalization (including imaging, microbiology, ancillary and laboratory) are listed below for reference.     Microbiology: Recent Results (from the past 240 hour(s))  MRSA PCR Screening     Status: None   Collection Time: 07/29/18  7:38 PM  Result Value Ref Range Status   MRSA by PCR NEGATIVE NEGATIVE Final    Comment:        The GeneXpert MRSA Assay (FDA approved for NASAL specimens only), is one component of a comprehensive MRSA colonization surveillance program. It is not intended to diagnose MRSA infection nor to guide or monitor treatment for MRSA infections. Performed at [facility], [place] [address] [place], [place] [address]   Gram stain     Status: None   Collection Time: 08/01/18  2:35 PM  Result Value Ref Range Status   Specimen Description PLEURAL RIGHT  Final   Special Requests NONE   Final   Gram Stain   Final    FEW WBC PRESENT,BOTH PMN AND MONONUCLEAR NO ORGANISMS SEEN Performed at [facility], [address], [place], [place] [address]    Report Status 08/01/2018 FINAL  Final  Culture, body fluid-bottle     Status: None (Preliminary result)   Collection Time: 08/01/18  2:35 PM  Result Value Ref Range Status   Specimen Description PLEURAL RIGHT  Final   Special Requests NONE  Final   Culture   Final    NO GROWTH < 24 HOURS Performed at [facility], [place] [address], [place], [place] [address]    Report Status PENDING  Incomplete     Labs: BNP (last 3 results) Recent Labs    05/24/18 1712 06/02/18 1628 07/29/18 1119  BNP 1,210.4* 627.9* 694.8*   Basic Metabolic Panel: Recent Labs  Lab 07/29/18 1119 07/31/18 0246 08/01/18 0604 08/02/18 0346 08/02/18 0649  NA 140 137 137 138  --   K 4.1 4.3 4.6 5.5* 4.4  CL 106 102 100 104  --   CO2 27 24 27 23   --   GLUCOSE 104* 109* 118* 88  --   BUN 25* 27* 29* 29*  --   CREATININE 1.31* 2.21* 2.35* 2.12*  --   CALCIUM 8.5* 8.6* 8.9 8.3*  --    Liver Function Tests: No results for input(s): AST, ALT, ALKPHOS, BILITOT, PROT, ALBUMIN in the last 168 hours. No results for input(s): LIPASE, AMYLASE in the last 168 hours. No results for input(s): AMMONIA in the last 168 hours. CBC: Recent Labs  Lab 07/29/18 1119 07/30/18 0625 07/31/18 0246 08/02/18 0649  WBC 4.3 4.6 5.2 5.4  NEUTROABS 2.4  --   --   --   HGB 9.1* 10.4* 9.7* 9.9*  HCT 28.6* 31.6* 29.0* 30.1*  MCV 100.0 98.1 96.7 96.8  PLT 127* 138* 120* 127*   Cardiac Enzymes: Recent Labs  Lab 07/29/18 1552 07/29/18 2257  TROPONINI 0.25* 0.22*   BNP: Invalid input(s): POCBNP CBG: No results for input(s): GLUCAP in the last 168 hours. D-Dimer No results for input(s): DDIMER in the last 72 hours. Hgb A1c No results for input(s): HGBA1C in the  last 72 hours. Lipid Profile No results for input(s): CHOL, HDL, LDLCALC, TRIG,  CHOLHDL, LDLDIRECT in the last 72 hours. Thyroid function studies No results for input(s): TSH, T4TOTAL, T3FREE, THYROIDAB in the last 72 hours.  Invalid input(s): FREET3 Anemia work up No results for input(s): VITAMINB12, FOLATE, FERRITIN, TIBC, IRON, RETICCTPCT in the last 72 hours. Urinalysis    Component Value Date/Time   COLORURINE YELLOW 07/29/2018 [place] 07/29/2018 1149   LABSPEC 1.011 07/29/2018 1149   PHURINE 5.0 07/29/2018 1149   GLUCOSEU NEGATIVE 07/29/2018 1149   HGBUR NEGATIVE 07/29/2018 1149   BILIRUBINUR NEGATIVE 07/29/2018 1149   KETONESUR NEGATIVE 07/29/2018 1149   PROTEINUR NEGATIVE 07/29/2018 1149   UROBILINOGEN 0.2 05/09/2015 1523   NITRITE NEGATIVE 07/29/2018 1149   LEUKOCYTESUR NEGATIVE 07/29/2018 1149   Sepsis Labs Invalid input(s): PROCALCITONIN,  WBC,  LACTICIDVEN Microbiology Recent Results (from the past 240 hour(s))  MRSA PCR Screening     Status: None   Collection Time: 07/29/18  7:38 PM  Result Value Ref Range Status   MRSA by PCR NEGATIVE NEGATIVE Final    Comment:        The GeneXpert MRSA Assay (FDA approved for NASAL specimens only), is one component of a comprehensive MRSA colonization surveillance program. It is not intended to diagnose MRSA infection nor to guide or monitor treatment for MRSA infections. Performed at [facility], [place] [address] [place], [place] [address]   Gram stain     Status: None   Collection Time: 08/01/18  2:35 PM  Result Value Ref Range Status   Specimen Description PLEURAL RIGHT  Final   Special Requests NONE  Final   Gram Stain   Final    FEW WBC PRESENT,BOTH PMN AND MONONUCLEAR NO ORGANISMS SEEN Performed at [facility], [address], [place], [place] [address]    Report Status 08/01/2018 FINAL  Final  Culture, body fluid-bottle     Status: None (Preliminary result)   Collection Time: 08/01/18  2:35 PM  Result Value Ref Range Status   Specimen Description  PLEURAL RIGHT  Final   Special Requests NONE  Final   Culture   Final    NO GROWTH < 24 HOURS Performed at [facility], [place] [address] [place], [place] [address]    Report Status PENDING  Incomplete     Time coordinating discharge: 40 minutes       SIGNED:   Edwin Dada, MD  [facility] 08/02/2018, 10:18 AM

## 2018-08-02 NOTE — Clinical Social Work Note (Signed)
Clinical Social Work Assessment  Patient Details  Name: John Peck MRN: [id number] Date of Birth: 08/21/1924  Date of referral:  08/02/18               Reason for consult:  Discharge Planning, Facility Placement                Permission sought to share information with:  Family Supports Permission granted to share information::  Yes, Verbal Permission Granted  Name::     Dreyden Rohrman  Agency::  [facility]  Relationship::  [phone]  Contact Information:  daughter  Housing/Transportation Living arrangements for the past 2 months:  [place] of Information:  Patient Patient Interpreter Needed:  None Criminal Activity/Legal Involvement Pertinent to Current Situation/Hospitalization:  No - Comment as needed Significant Relationships:  Adult Children, Community Support Lives with:  Facility Resident Do you feel safe going back to the place where you live?  Yes Need for family participation in patient care:  Yes (Comment)  Care giving concerns:  No family at bedside. patient is from [facility] long term   Facilities manager / plan:  CSW met patient at bedside to discuss discharge plan. Patient is from [facility] and stated he would like to return back. Patient asked CSW to contact daughter to make her aware of his return back to facility. CSW contacted  daughter to make her aware  Employment status:  Retired Nurse, adult PT Recommendations:  [place] / Referral to community resources:  [place]  Patient/Family's Response to care:  Patient verbalized appreciation for CSW role in care  Patient/Family's Understanding of and Emotional Response to Diagnosis, Current Treatment, and Prognosis:  Patient agreeable to discharge back to [facility]  Emotional Assessment Appearance:  Appears stated age Attitude/Demeanor/Rapport:  Engaged Affect (typically observed):   Accepting Orientation:  Oriented to Self, Oriented to Situation, Oriented to Place, Oriented to  Time Alcohol / Substance use:  Not Applicable Psych involvement (Current and /or in the community):  No (Comment)  Discharge Needs  Concerns to be addressed:  Care Coordination Readmission within the last 30 days:  No Current discharge risk:  Dependent with Mobility Barriers to Discharge:  No Barriers Identified   Wende Neighbors, LCSW 08/02/2018, 11:55 AM

## 2018-08-02 NOTE — Progress Notes (Signed)
Clinical Social Worker facilitated patient discharge including contacting patient family and facility to confirm patient discharge plans.  Clinical information faxed to facility and family agreeable with plan.  CSW arranged ambulance transport via [facility] to [facility] .  RN to call [phone]) for report prior to discharge.  Clinical Social Worker will sign off for now as social work intervention is no longer needed. Please consult [place] again if new need arises.  Rhea Pink, MSW, [place]

## 2018-08-02 NOTE — NC FL2 (Signed)
[place] LEVEL OF CARE SCREENING TOOL     IDENTIFICATION  Patient Name: John Peck Birthdate: [birth year] Sex: male Admission Date (Current Location): 07/29/2018  [facility] and [place] Number:  Herbalist and Address:  [facility] [place], [place] [address] [place], [place] [address]      Provider Number: [id number]  Attending Physician Name and Address:  Edwin Dada, *  Relative Name and Phone Number:  Daytona Retana, [phone]    Current Level of Care: Hospital Recommended Level of Care: [place] Prior Approval Number:    Date Approved/Denied:   PASRR Number:    Discharge Plan: SNF    Current Diagnoses: Patient Active Problem List   Diagnosis Date Noted  . Right flank pain   . Goals of care, counseling/discussion   . Palliative care encounter   . Chest pain 07/29/2018  . TIA (transient ischemic attack) 06/29/2018  . Pressure injury of skin 05/25/2018  . Pleural effusion 05/24/2018  . Acute respiratory failure with hypoxia ([place]) 05/24/2018  . NSTEMI (non-ST elevated myocardial infarction) ([place])   . Acute on chronic combined systolic and diastolic CHF (congestive heart failure) ([place]) 03/14/2018  . COPD clinical dx/ no pfts on record 03/14/2018  . H/O: GI bleed 03/14/2018  . CKD (chronic kidney disease), stage III ([place]) 03/14/2018  . Paroxysmal atrial fibrillation ([place]) 03/14/2018  . Hypothyroidism 03/14/2018  . Carotid stenosis, right 03/14/2018  . Facial droop 02/22/2018  . Pancytopenia ([place]) 02/22/2018  . Generalized weakness   . Hypertensive heart disease   . Hyperlipidemia   . Stented coronary artery   . Coronary artery disease involving native coronary artery of native heart with unstable angina pectoris ([place])   . Reactive airway disease 11/27/2015  . CAD in native artery 09/03/2015  . Weakness 05/09/2015  . Hyperlipidemia LDL goal <70 02/28/2015  . Atherosclerosis of lower extremity  with claudication ([facility]) 07/17/2014  . PAF (paroxysmal atrial fibrillation) ([place]) 03/24/2013  . Lower extremity edema 03/24/2013  . Sinoatrial node dysfunction ([place]) 08/17/2012  . History of iron deficiency anemia 09/18/2011  . Essential hypertension 07/07/2009  . INTERMEDIATE CORONARY SYNDROME 07/07/2009  . S/P CABG x 2 '89. RCA stent'04. last cath Jan 2013 07/07/2009  . PACEMAKER, PERMANENT- St Jude Sept 2009 07/07/2009  . Sick sinus syndrome ([place]) 07/10/2008  . Pacemaker 07/10/2008    Orientation RESPIRATION BLADDER Height & Weight     Self, Time, Situation, Place  O2(2l) External catheter, Incontinent Weight: 137 lb 2 oz (62.2 kg) Height:  5\' 9"  (175.3 cm)  BEHAVIORAL SYMPTOMS/MOOD NEUROLOGICAL BOWEL NUTRITION STATUS      Continent Diet(dsy 1)  AMBULATORY STATUS COMMUNICATION OF NEEDS Skin   Limited Assist Verbally                         Personal Care Assistance Level of Assistance  Bathing, Feeding, Dressing Bathing Assistance: Limited assistance Feeding assistance: Independent Dressing Assistance: Limited assistance     Functional Limitations Info  Sight, Hearing, Speech Sight Info: Adequate Hearing Info: Adequate Speech Info: Adequate    SPECIAL CARE FACTORS FREQUENCY  PT (By licensed PT), OT (By licensed OT)     PT Frequency: 3x wk OT Frequency: 3x wk            Contractures Contractures Info: Not present    Additional Factors Info  Code Status, Allergies Code Status Info: DNR Allergies Info: No known allergies  Current Medications (08/02/2018):  This is the current hospital active medication list Current Facility-Administered Medications  Medication Dose Route Frequency Provider Last Rate Last Dose  . acetaminophen (TYLENOL) tablet 1,000 mg  1,000 mg Oral I3J PRN Pershing Proud, NP   8,250 mg at 08/01/18 0413  . albuterol (PROVENTIL) (2.5 MG/3ML) 0.083% nebulizer solution 2.5 mg  2.5 mg Inhalation Q4H PRN Janora Norlander, MD    2.5 mg at 07/31/18 0417  . ALPRAZolam Duanne Moron) tablet 0.25 mg  0.25 mg Oral BID PRN Janora Norlander, MD      . aspirin EC tablet 81 mg  81 mg Oral Daily Janora Norlander, MD   81 mg at 08/02/18 5397  . atorvastatin (LIPITOR) tablet 40 mg  40 mg Oral QPM Janora Norlander, MD   40 mg at 08/01/18 1743  . carvedilol (COREG) tablet 12.5 mg  12.5 mg Oral BID WC Janora Norlander, MD   12.5 mg at 08/02/18 6734  . clopidogrel (PLAVIX) tablet 75 mg  75 mg Oral Daily Janora Norlander, MD   75 mg at 08/02/18 1937  . ezetimibe (ZETIA) tablet 10 mg  10 mg Oral Daily Janora Norlander, MD   10 mg at 08/02/18 9024  . ferrous sulfate tablet 325 mg  325 mg Oral BID WC Janora Norlander, MD   325 mg at 08/02/18 0921  . heparin injection 5,000 Units  5,000 Units Subcutaneous Q8H Skipper Cliche A, MD   5,000 Units at 08/02/18 0503  . hydrALAZINE (APRESOLINE) injection 10 mg  10 mg Intravenous Q6H PRN Bodenheimer, Charles A, NP      . ipratropium-albuterol (DUONEB) 0.5-2.5 (3) MG/3ML nebulizer solution 3 mL  3 mL Nebulization TID Jola Schmidt, MD   3 mL at 08/02/18 0734  . isosorbide mononitrate (IMDUR) 24 hr tablet 90 mg  90 mg Oral Daily Shahmehdi, Seyed A, MD   90 mg at 08/02/18 0921  . latanoprost (XALATAN) 0.005 % ophthalmic solution 1 drop  1 drop Both Eyes Once per day on Mon Wed [place], Sally M, MD   1 drop at 08/01/18 0930  . levothyroxine (SYNTHROID, LEVOTHROID) tablet 125 mcg  125 mcg Oral QAC breakfast Janora Norlander, MD   125 mcg at 08/02/18 0800  . lidocaine (LIDODERM) 5 % 1 patch  1 patch Transdermal O97D Pershing Proud, NP   1 patch at 08/01/18 2045  . lidocaine (XYLOCAINE) 1 % (with pres) injection    PRN Candiss Norse A, PA-C   10 mL at 08/01/18 1411  . MEDLINE mouth rinse  15 mL Mouth Rinse BID Shahmehdi, Seyed A, MD   15 mL at 08/02/18 0923  . nitroGLYCERIN (NITROSTAT) SL tablet 0.4 mg  0.4 mg Sublingual Q5 min PRN Janora Norlander, MD      . ondansetron [facility]) injection 4 mg  4 mg  Intravenous Q6H PRN Janora Norlander, MD      . pantoprazole (PROTONIX) EC tablet 40 mg  40 mg Oral Daily Janora Norlander, MD   40 mg at 08/02/18 5329  . polyethylene glycol (MIRALAX / GLYCOLAX) packet 17 g  17 g Oral Daily Janora Norlander, MD   17 g at 08/02/18 9242  . ranolazine (RANEXA) 12 hr tablet 1,000 mg  1,000 mg Oral BID Janora Norlander, MD   1,000 mg at 08/02/18 6834  . RESOURCE THICKENUP CLEAR   Oral PRN Janora Norlander, MD      .  senna (SENOKOT) tablet 17.2 mg  2 tablet Oral BID PRN Janora Norlander, MD      . tamsulosin [facility]) capsule 0.4 mg  0.4 mg Oral Daily Janora Norlander, MD   0.4 mg at 08/02/18 7793     Discharge Medications: Please see discharge summary for a list of discharge medications.  Relevant Imaging Results:  Relevant Lab Results:   Additional Information SS# [id number]  Wende Neighbors, LCSW

## 2018-08-03 ENCOUNTER — Encounter: Payer: Self-pay | Admitting: Cardiology

## 2018-08-03 DIAGNOSIS — R079 Chest pain, unspecified: Secondary | ICD-10-CM | POA: Diagnosis not present

## 2018-08-03 DIAGNOSIS — J90 Pleural effusion, not elsewhere classified: Secondary | ICD-10-CM | POA: Diagnosis not present

## 2018-08-03 DIAGNOSIS — N179 Acute kidney failure, unspecified: Secondary | ICD-10-CM | POA: Diagnosis not present

## 2018-08-03 DIAGNOSIS — R0600 Dyspnea, unspecified: Secondary | ICD-10-CM | POA: Diagnosis not present

## 2018-08-03 NOTE — Progress Notes (Signed)
Remote pacemaker transmission.   

## 2018-08-04 DIAGNOSIS — Z79899 Other long term (current) drug therapy: Secondary | ICD-10-CM | POA: Diagnosis not present

## 2018-08-04 DIAGNOSIS — I509 Heart failure, unspecified: Secondary | ICD-10-CM | POA: Diagnosis not present

## 2018-08-04 DIAGNOSIS — Z950 Presence of cardiac pacemaker: Secondary | ICD-10-CM | POA: Diagnosis not present

## 2018-08-04 DIAGNOSIS — R630 Anorexia: Secondary | ICD-10-CM | POA: Diagnosis not present

## 2018-08-05 DIAGNOSIS — I509 Heart failure, unspecified: Secondary | ICD-10-CM | POA: Diagnosis not present

## 2018-08-05 DIAGNOSIS — N189 Chronic kidney disease, unspecified: Secondary | ICD-10-CM | POA: Diagnosis not present

## 2018-08-06 LAB — CULTURE, BODY FLUID W GRAM STAIN -BOTTLE: Culture: NO GROWTH

## 2018-08-08 ENCOUNTER — Ambulatory Visit: Payer: Medicare HMO | Admitting: Cardiovascular Disease

## 2018-08-08 DIAGNOSIS — Z79899 Other long term (current) drug therapy: Secondary | ICD-10-CM | POA: Diagnosis not present

## 2018-08-09 ENCOUNTER — Emergency Department (HOSPITAL_COMMUNITY): Payer: Medicare HMO

## 2018-08-09 ENCOUNTER — Other Ambulatory Visit: Payer: Self-pay

## 2018-08-09 ENCOUNTER — Inpatient Hospital Stay (HOSPITAL_COMMUNITY)
Admission: EM | Admit: 2018-08-09 | Discharge: 2018-08-12 | DRG: 070 | Disposition: A | Discharge status: Skilled Nursing Facility | Payer: Medicare HMO | Attending: Internal Medicine | Admitting: Internal Medicine

## 2018-08-09 DIAGNOSIS — F0390 Unspecified dementia without behavioral disturbance: Secondary | ICD-10-CM | POA: Diagnosis present

## 2018-08-09 DIAGNOSIS — R451 Restlessness and agitation: Secondary | ICD-10-CM | POA: Diagnosis not present

## 2018-08-09 DIAGNOSIS — R4182 Altered mental status, unspecified: Secondary | ICD-10-CM

## 2018-08-09 DIAGNOSIS — E875 Hyperkalemia: Secondary | ICD-10-CM | POA: Diagnosis not present

## 2018-08-09 DIAGNOSIS — R410 Disorientation, unspecified: Secondary | ICD-10-CM | POA: Diagnosis present

## 2018-08-09 DIAGNOSIS — R404 Transient alteration of awareness: Secondary | ICD-10-CM | POA: Diagnosis not present

## 2018-08-09 DIAGNOSIS — N183 Chronic kidney disease, stage 3 unspecified: Secondary | ICD-10-CM | POA: Diagnosis present

## 2018-08-09 DIAGNOSIS — E860 Dehydration: Secondary | ICD-10-CM

## 2018-08-09 DIAGNOSIS — J90 Pleural effusion, not elsewhere classified: Secondary | ICD-10-CM | POA: Diagnosis not present

## 2018-08-09 DIAGNOSIS — N184 Chronic kidney disease, stage 4 (severe): Secondary | ICD-10-CM | POA: Diagnosis present

## 2018-08-09 DIAGNOSIS — I480 Paroxysmal atrial fibrillation: Secondary | ICD-10-CM | POA: Diagnosis present

## 2018-08-09 DIAGNOSIS — I10 Essential (primary) hypertension: Secondary | ICD-10-CM | POA: Diagnosis present

## 2018-08-09 DIAGNOSIS — I5022 Chronic systolic (congestive) heart failure: Secondary | ICD-10-CM | POA: Diagnosis present

## 2018-08-09 DIAGNOSIS — I495 Sick sinus syndrome: Secondary | ICD-10-CM | POA: Diagnosis present

## 2018-08-09 DIAGNOSIS — I2510 Atherosclerotic heart disease of native coronary artery without angina pectoris: Secondary | ICD-10-CM | POA: Diagnosis present

## 2018-08-09 DIAGNOSIS — G9341 Metabolic encephalopathy: Secondary | ICD-10-CM | POA: Diagnosis not present

## 2018-08-09 DIAGNOSIS — Z66 Do not resuscitate: Secondary | ICD-10-CM | POA: Diagnosis present

## 2018-08-09 DIAGNOSIS — Z950 Presence of cardiac pacemaker: Secondary | ICD-10-CM | POA: Diagnosis not present

## 2018-08-09 DIAGNOSIS — Z79899 Other long term (current) drug therapy: Secondary | ICD-10-CM

## 2018-08-09 DIAGNOSIS — Z951 Presence of aortocoronary bypass graft: Secondary | ICD-10-CM

## 2018-08-09 DIAGNOSIS — Z955 Presence of coronary angioplasty implant and graft: Secondary | ICD-10-CM | POA: Diagnosis not present

## 2018-08-09 DIAGNOSIS — Z862 Personal history of diseases of the blood and blood-forming organs and certain disorders involving the immune mechanism: Secondary | ICD-10-CM

## 2018-08-09 DIAGNOSIS — I130 Hypertensive heart and chronic kidney disease with heart failure and stage 1 through stage 4 chronic kidney disease, or unspecified chronic kidney disease: Secondary | ICD-10-CM | POA: Diagnosis present

## 2018-08-09 DIAGNOSIS — I119 Hypertensive heart disease without heart failure: Secondary | ICD-10-CM | POA: Diagnosis present

## 2018-08-09 DIAGNOSIS — Z7951 Long term (current) use of inhaled steroids: Secondary | ICD-10-CM | POA: Diagnosis not present

## 2018-08-09 DIAGNOSIS — Z7982 Long term (current) use of aspirin: Secondary | ICD-10-CM | POA: Diagnosis not present

## 2018-08-09 DIAGNOSIS — I5043 Acute on chronic combined systolic (congestive) and diastolic (congestive) heart failure: Secondary | ICD-10-CM | POA: Diagnosis not present

## 2018-08-09 DIAGNOSIS — R319 Hematuria, unspecified: Secondary | ICD-10-CM | POA: Diagnosis not present

## 2018-08-09 DIAGNOSIS — R1310 Dysphagia, unspecified: Secondary | ICD-10-CM | POA: Diagnosis present

## 2018-08-09 DIAGNOSIS — E039 Hypothyroidism, unspecified: Secondary | ICD-10-CM | POA: Diagnosis present

## 2018-08-09 DIAGNOSIS — Z87891 Personal history of nicotine dependence: Secondary | ICD-10-CM | POA: Diagnosis not present

## 2018-08-09 DIAGNOSIS — I4891 Unspecified atrial fibrillation: Secondary | ICD-10-CM | POA: Diagnosis not present

## 2018-08-09 DIAGNOSIS — E785 Hyperlipidemia, unspecified: Secondary | ICD-10-CM | POA: Diagnosis present

## 2018-08-09 DIAGNOSIS — R278 Other lack of coordination: Secondary | ICD-10-CM | POA: Diagnosis not present

## 2018-08-09 DIAGNOSIS — N179 Acute kidney failure, unspecified: Secondary | ICD-10-CM | POA: Diagnosis not present

## 2018-08-09 DIAGNOSIS — N189 Chronic kidney disease, unspecified: Secondary | ICD-10-CM | POA: Diagnosis not present

## 2018-08-09 DIAGNOSIS — J449 Chronic obstructive pulmonary disease, unspecified: Secondary | ICD-10-CM | POA: Diagnosis present

## 2018-08-09 DIAGNOSIS — M6281 Muscle weakness (generalized): Secondary | ICD-10-CM | POA: Diagnosis not present

## 2018-08-09 DIAGNOSIS — Z8673 Personal history of transient ischemic attack (TIA), and cerebral infarction without residual deficits: Secondary | ICD-10-CM

## 2018-08-09 DIAGNOSIS — R0902 Hypoxemia: Secondary | ICD-10-CM | POA: Diagnosis not present

## 2018-08-09 DIAGNOSIS — R41841 Cognitive communication deficit: Secondary | ICD-10-CM | POA: Diagnosis not present

## 2018-08-09 DIAGNOSIS — N390 Urinary tract infection, site not specified: Secondary | ICD-10-CM

## 2018-08-09 DIAGNOSIS — R0689 Other abnormalities of breathing: Secondary | ICD-10-CM | POA: Diagnosis not present

## 2018-08-09 DIAGNOSIS — R4020 Unspecified coma: Secondary | ICD-10-CM | POA: Diagnosis not present

## 2018-08-09 DIAGNOSIS — R293 Abnormal posture: Secondary | ICD-10-CM | POA: Diagnosis not present

## 2018-08-09 DIAGNOSIS — Z7902 Long term (current) use of antithrombotics/antiplatelets: Secondary | ICD-10-CM

## 2018-08-09 DIAGNOSIS — R69 Illness, unspecified: Secondary | ICD-10-CM | POA: Diagnosis not present

## 2018-08-09 DIAGNOSIS — R2681 Unsteadiness on feet: Secondary | ICD-10-CM | POA: Diagnosis not present

## 2018-08-09 DIAGNOSIS — R1312 Dysphagia, oropharyngeal phase: Secondary | ICD-10-CM | POA: Diagnosis not present

## 2018-08-09 DIAGNOSIS — R2689 Other abnormalities of gait and mobility: Secondary | ICD-10-CM | POA: Diagnosis not present

## 2018-08-09 HISTORY — DX: Dyspnea, unspecified: R06.00

## 2018-08-09 LAB — COMPREHENSIVE METABOLIC PANEL
ALK PHOS: 59 U/L (ref 38–126)
ALT: 8 U/L (ref 0–44)
ANION GAP: 11 (ref 5–15)
AST: 18 U/L (ref 15–41)
Albumin: 3.2 g/dL — ABNORMAL LOW (ref 3.5–5.0)
BILIRUBIN TOTAL: 1.3 mg/dL — AB (ref 0.3–1.2)
BUN: 30 mg/dL — ABNORMAL HIGH (ref 8–23)
CALCIUM: 9 mg/dL (ref 8.9–10.3)
CO2: 26 mmol/L (ref 22–32)
CREATININE: 2.55 mg/dL — AB (ref 0.61–1.24)
Chloride: 101 mmol/L (ref 98–111)
GFR calc Af Amer: 24 mL/min — ABNORMAL LOW (ref >60)
GFR, EST NON AFRICAN AMERICAN: 20 mL/min — AB (ref >60)
Glucose, Bld: 94 mg/dL (ref 70–99)
POTASSIUM: 4.4 mmol/L (ref 3.5–5.1)
Sodium: 138 mmol/L (ref 135–145)
TOTAL PROTEIN: 7.9 g/dL (ref 6.5–8.1)

## 2018-08-09 LAB — I-STAT VENOUS BLOOD GAS, ED
Acid-Base Excess: 1 mmol/L (ref 0.0–2.0)
Bicarbonate: 25.7 mmol/L (ref 20.0–28.0)
O2 SAT: 49 %
PCO2 VEN: 38.9 mmHg — AB (ref 44.0–60.0)
TCO2: 27 mmol/L (ref 22–32)
pH, Ven: 7.427 (ref 7.250–7.430)
pO2, Ven: 26 mmHg — CL (ref 32.0–45.0)

## 2018-08-09 LAB — CBC
HCT: 33.6 % — ABNORMAL LOW (ref 39.0–52.0)
HEMOGLOBIN: 11 g/dL — AB (ref 13.0–17.0)
MCH: 31.9 pg (ref 26.0–34.0)
MCHC: 32.7 g/dL (ref 30.0–36.0)
MCV: 97.4 fL (ref 78.0–100.0)
PLATELETS: 214 10*3/uL (ref 150–400)
RBC: 3.45 MIL/uL — AB (ref 4.22–5.81)
RDW: 14.1 % (ref 11.5–15.5)
WBC: 4.7 10*3/uL (ref 4.0–10.5)

## 2018-08-09 LAB — CBG MONITORING, ED: Glucose-Capillary: 84 mg/dL (ref 70–99)

## 2018-08-09 LAB — URINALYSIS, ROUTINE W REFLEX MICROSCOPIC
Bilirubin Urine: NEGATIVE
Glucose, UA: NEGATIVE mg/dL
HGB URINE DIPSTICK: NEGATIVE
Ketones, ur: 5 mg/dL — AB
NITRITE: NEGATIVE
PROTEIN: NEGATIVE mg/dL
SPECIFIC GRAVITY, URINE: 1.016 (ref 1.005–1.030)
pH: 5 (ref 5.0–8.0)

## 2018-08-09 LAB — AMMONIA: Ammonia: 19 umol/L (ref 9–35)

## 2018-08-09 LAB — PROTIME-INR
INR: 1.24
PROTHROMBIN TIME: 15.5 s — AB (ref 11.4–15.2)

## 2018-08-09 LAB — MRSA PCR SCREENING: MRSA by PCR: NEGATIVE

## 2018-08-09 LAB — I-STAT TROPONIN, ED
TROPONIN I, POC: 0.05 ng/mL (ref 0.00–0.08)
TROPONIN I, POC: 0.06 ng/mL (ref 0.00–0.08)

## 2018-08-09 LAB — I-STAT CG4 LACTIC ACID, ED: Lactic Acid, Venous: 1.32 mmol/L (ref 0.5–1.9)

## 2018-08-09 LAB — TSH: TSH: 3.389 u[IU]/mL (ref 0.350–4.500)

## 2018-08-09 LAB — BRAIN NATRIURETIC PEPTIDE: B Natriuretic Peptide: 1084.8 pg/mL — ABNORMAL HIGH (ref 0.0–100.0)

## 2018-08-09 MED ORDER — ISOSORBIDE MONONITRATE ER 60 MG PO TB24
90.0000 mg | ORAL_TABLET | Freq: Every day | ORAL | Status: DC
  Administered 2018-08-10: 90 mg via ORAL
  Filled 2018-08-09: qty 1

## 2018-08-09 MED ORDER — HEPARIN SODIUM (PORCINE) 5000 UNIT/ML IJ SOLN
5000.0000 [IU] | Freq: Three times a day (TID) | INTRAMUSCULAR | Status: DC
  Administered 2018-08-09 – 2018-08-12 (×7): 5000 [IU] via SUBCUTANEOUS
  Filled 2018-08-09 (×6): qty 1

## 2018-08-09 MED ORDER — POLYETHYLENE GLYCOL 3350 17 G PO PACK
17.0000 g | PACK | Freq: Every day | ORAL | Status: DC
  Administered 2018-08-11 – 2018-08-12 (×2): 17 g via ORAL
  Filled 2018-08-09 (×2): qty 1

## 2018-08-09 MED ORDER — STARCH (THICKENING) PO POWD: ORAL | Status: DC | PRN

## 2018-08-09 MED ORDER — TAMSULOSIN HCL 0.4 MG PO CAPS
0.4000 mg | ORAL_CAPSULE | Freq: Every day | ORAL | Status: DC
  Administered 2018-08-10 – 2018-08-12 (×3): 0.4 mg via ORAL
  Filled 2018-08-09 (×3): qty 1

## 2018-08-09 MED ORDER — SODIUM CHLORIDE 0.9% FLUSH
3.0000 mL | Freq: Two times a day (BID) | INTRAVENOUS | Status: DC
  Administered 2018-08-09 – 2018-08-12 (×6): 3 mL via INTRAVENOUS

## 2018-08-09 MED ORDER — RESOURCE THICKENUP CLEAR PO POWD
ORAL | Status: DC | PRN
  Filled 2018-08-09: qty 125

## 2018-08-09 MED ORDER — PANTOPRAZOLE SODIUM 40 MG PO TBEC
40.0000 mg | DELAYED_RELEASE_TABLET | Freq: Every day | ORAL | Status: DC
  Administered 2018-08-09 – 2018-08-12 (×4): 40 mg via ORAL
  Filled 2018-08-09 (×5): qty 1

## 2018-08-09 MED ORDER — ASPIRIN 81 MG PO TABS: 81.0000 mg | ORAL_TABLET | Freq: Every day | ORAL | Status: DC

## 2018-08-09 MED ORDER — ALBUTEROL SULFATE (2.5 MG/3ML) 0.083% IN NEBU
3.0000 mL | INHALATION_SOLUTION | RESPIRATORY_TRACT | Status: DC | PRN
  Administered 2018-08-12: 3 mL via RESPIRATORY_TRACT
  Filled 2018-08-09: qty 3

## 2018-08-09 MED ORDER — EZETIMIBE 10 MG PO TABS
10.0000 mg | ORAL_TABLET | Freq: Every day | ORAL | Status: DC
  Administered 2018-08-10 – 2018-08-12 (×3): 10 mg via ORAL
  Filled 2018-08-09 (×4): qty 1

## 2018-08-09 MED ORDER — CARVEDILOL 12.5 MG PO TABS
12.5000 mg | ORAL_TABLET | Freq: Two times a day (BID) | ORAL | Status: DC
  Administered 2018-08-10 – 2018-08-12 (×5): 12.5 mg via ORAL
  Filled 2018-08-09 (×5): qty 1

## 2018-08-09 MED ORDER — RISPERIDONE 0.5 MG PO TBDP
0.5000 mg | ORAL_TABLET | Freq: Once | ORAL | Status: AC
  Administered 2018-08-09: 0.5 mg via ORAL
  Filled 2018-08-09 (×2): qty 1

## 2018-08-09 MED ORDER — RANOLAZINE ER 500 MG PO TB12
500.0000 mg | ORAL_TABLET | Freq: Two times a day (BID) | ORAL | Status: DC
  Administered 2018-08-09 – 2018-08-12 (×6): 500 mg via ORAL
  Filled 2018-08-09 (×7): qty 1

## 2018-08-09 MED ORDER — IPRATROPIUM-ALBUTEROL 0.5-2.5 (3) MG/3ML IN SOLN
3.0000 mL | Freq: Four times a day (QID) | RESPIRATORY_TRACT | Status: DC | PRN
  Administered 2018-08-09: 3 mL via RESPIRATORY_TRACT
  Filled 2018-08-09: qty 3

## 2018-08-09 MED ORDER — NITROGLYCERIN 0.4 MG SL SUBL: 0.4000 mg | SUBLINGUAL_TABLET | SUBLINGUAL | Status: DC | PRN

## 2018-08-09 MED ORDER — LATANOPROST 0.005 % OP SOLN
1.0000 [drp] | OPHTHALMIC | Status: DC
  Administered 2018-08-10: 1 [drp] via OPHTHALMIC
  Filled 2018-08-09: qty 2.5

## 2018-08-09 MED ORDER — LEVOTHYROXINE SODIUM 25 MCG PO TABS
125.0000 ug | ORAL_TABLET | Freq: Every day | ORAL | Status: DC
  Administered 2018-08-10 – 2018-08-12 (×3): 125 ug via ORAL
  Filled 2018-08-09 (×4): qty 1

## 2018-08-09 MED ORDER — FUROSEMIDE 10 MG/ML IJ SOLN: 40.0000 mg | Freq: Once | INTRAMUSCULAR | Status: DC

## 2018-08-09 MED ORDER — SENNA 8.6 MG PO TABS: 2.0000 | ORAL_TABLET | Freq: Two times a day (BID) | ORAL | Status: DC | PRN

## 2018-08-09 MED ORDER — CLOPIDOGREL BISULFATE 75 MG PO TABS
75.0000 mg | ORAL_TABLET | Freq: Every day | ORAL | Status: DC
  Administered 2018-08-10 – 2018-08-12 (×3): 75 mg via ORAL
  Filled 2018-08-09 (×3): qty 1

## 2018-08-09 MED ORDER — IPRATROPIUM-ALBUTEROL 0.5-2.5 (3) MG/3ML IN SOLN
3.0000 mL | Freq: Two times a day (BID) | RESPIRATORY_TRACT | Status: DC
  Administered 2018-08-09 – 2018-08-11 (×4): 3 mL via RESPIRATORY_TRACT
  Filled 2018-08-09 (×7): qty 3

## 2018-08-09 MED ORDER — FUROSEMIDE 20 MG PO TABS
30.0000 mg | ORAL_TABLET | Freq: Two times a day (BID) | ORAL | Status: DC
  Administered 2018-08-09 – 2018-08-10 (×2): 30 mg via ORAL
  Filled 2018-08-09 (×2): qty 2

## 2018-08-09 MED ORDER — SODIUM CHLORIDE 0.9 % IV BOLUS
500.0000 mL | Freq: Once | INTRAVENOUS | Status: AC
  Administered 2018-08-09: 500 mL via INTRAVENOUS

## 2018-08-09 MED ORDER — SODIUM CHLORIDE 0.9% FLUSH: 3.0000 mL | INTRAVENOUS | Status: DC | PRN

## 2018-08-09 MED ORDER — ASPIRIN 81 MG PO CHEW
81.0000 mg | CHEWABLE_TABLET | Freq: Every day | ORAL | Status: DC
  Administered 2018-08-10 – 2018-08-12 (×3): 81 mg via ORAL
  Filled 2018-08-09 (×3): qty 1

## 2018-08-09 MED ORDER — FERROUS SULFATE 325 (65 FE) MG PO TABS
325.0000 mg | ORAL_TABLET | Freq: Two times a day (BID) | ORAL | Status: DC
  Administered 2018-08-10 – 2018-08-12 (×5): 325 mg via ORAL
  Filled 2018-08-09 (×5): qty 1

## 2018-08-09 MED ORDER — SODIUM CHLORIDE 0.9 % IV SOLN: 250.0000 mL | INTRAVENOUS | Status: DC | PRN

## 2018-08-09 MED ORDER — ACETAMINOPHEN 325 MG PO TABS
650.0000 mg | ORAL_TABLET | Freq: Three times a day (TID) | ORAL | Status: DC
  Administered 2018-08-09 – 2018-08-12 (×9): 650 mg via ORAL
  Filled 2018-08-09 (×9): qty 2

## 2018-08-09 MED ORDER — ATORVASTATIN CALCIUM 40 MG PO TABS
40.0000 mg | ORAL_TABLET | Freq: Every evening | ORAL | Status: DC
  Administered 2018-08-09 – 2018-08-11 (×3): 40 mg via ORAL
  Filled 2018-08-09: qty 1
  Filled 2018-08-09: qty 2
  Filled 2018-08-09: qty 1

## 2018-08-09 NOTE — ED Notes (Signed)
Family says patient is coherent right now and at baseline.

## 2018-08-09 NOTE — ED Provider Notes (Signed)
[facility] Emergency Department Provider Note MRN:  [id number]  Arrival date & time: 08/09/18     Chief Complaint   Altered Mental Status (Not acting himself)   History of Present Illness   John Peck is a 84 y.o. year-old male with a history of CAD, CKD, COPD, stroke presenting to the ED with chief complaint of altered mental status.  Sent from [place] and rehab, reportedly not acting himself.  Mumbling speech.  I was unable to obtain an accurate HPI, PMH, or ROS due to the patient's altered mental status.  Review of Systems  Positive for altered mental status.  Patient's Health History    Past Medical History:  Diagnosis Date  . Anemia   . Arthritis   . CAD (coronary artery disease)    a. CABG '89; b. PCI '04; c. 11/2015 Cath/PCI: LM 20ost, LAD 113m, LCX 30p, OM2 40, RCA 30p, 10p ISR, 90d (3.0x12 Synergy DES), AM 90, VG->OM2 known to be 100, LIMA->LAD not injected, patent in 2015.  . Carotid stenosis, right   . Chronic combined systolic and diastolic CHF (congestive heart failure) ([place])   . CKD (chronic kidney disease), stage III ([place])   . H/O: GI bleed    REMOTE HISTORY  . History of COPD   . Hyperlipidemia   . Hypertensive heart disease   . LBBB (left bundle branch block)   . Pacemaker Sept 2009   St Jude  . PAF (paroxysmal atrial fibrillation) (HCC)    a. not on anticoag due to prior GIB.  . Sick sinus syndrome [facility]) Sept 2009   ST Jude PTVDP  . Stroke [facility])     Past Surgical History:  Procedure Laterality Date  . CARDIAC CATHETERIZATION  11/2011   EF 50%; significant native CAD w/80% stenosis of the LAD after 2nd giagonal vessel and septal perforating artery w/total occlusion of the mid left anterior descendiung.; 60-70% ostiasl stenosis in the circumflex vessel followed by 40% proximal stenosis and 70-80% distal circumflex stenosis;   . CARDIAC CATHETERIZATION N/A 11/29/2015   Procedure: Left Heart Cath and Cors/Grafts Angiography;   Surgeon: Burnell Blanks, MD;  Location: [place] CV LAB;  Service: Cardiovascular;  Laterality: N/A;  . CARDIAC CATHETERIZATION N/A 11/29/2015   Procedure: Coronary Stent Intervention;  Surgeon: Burnell Blanks, MD;  Location: [place] CV LAB;  Service: Cardiovascular;  Laterality: N/A;  . CATARACT EXTRACTION  2004  . CORONARY ANGIOGRAPHY N/A 02/25/2017   Procedure: Coronary Angiography;  Surgeon: Belva Crome, MD;  Location: [place] CV LAB;  Service: Cardiovascular;  Laterality: N/A;  . CORONARY ANGIOPLASTY WITH STENT PLACEMENT  2004   RCA  . CORONARY ARTERY BYPASS GRAFT  1989   had LIMA to his LAD, a vein to the circumflex. In 2004 underwent stenting to his right coronary artery.  . CORONARY STENT INTERVENTION N/A 02/24/2017   Procedure: Coronary Stent Intervention;  Surgeon: Wellington Hampshire, MD;  Location: [place] CV LAB;  Service: Cardiovascular;  Laterality: N/A;  cfx  . HEMORRHOID SURGERY    . INSERT / REPLACE / REMOVE PACEMAKER  07/17/08   DUAL-CHAMBER; PPM-ST.JUDE MEDNET  . IR THORACENTESIS ASP PLEURAL SPACE W/IMG GUIDE  05/25/2018  . IR THORACENTESIS ASP PLEURAL SPACE W/IMG GUIDE  08/01/2018  . LEFT HEART CATH AND CORS/GRAFTS ANGIOGRAPHY N/A 02/24/2017   Procedure: Left Heart Cath and Cors/Grafts Angiography;  Surgeon: Wellington Hampshire, MD;  Location: [place] CV LAB;  Service: Cardiovascular;  Laterality:  N/A;  . LEFT HEART CATHETERIZATION WITH CORONARY/GRAFT ANGIOGRAM N/A 12/10/2011   Procedure: LEFT HEART CATHETERIZATION WITH Beatrix Fetters;  Surgeon: Troy Sine, MD;  Location[facility] CATH LAB;  Service: Cardiovascular;  Laterality: N/A;  . LEFT HEART CATHETERIZATION WITH CORONARY/GRAFT ANGIOGRAM N/A 01/26/2014   Procedure: LEFT HEART CATHETERIZATION WITH Beatrix Fetters;  Surgeon: Troy Sine, MD;  Location: [facility] CATH LAB;  Service: Cardiovascular;  Laterality: N/A;  . PPM GENERATOR CHANGEOUT N/A 12/15/2017   Procedure: PPM GENERATOR  CHANGEOUT;  Surgeon: Evans Lance, MD;  Location: [place] CV LAB;  Service: Cardiovascular;  Laterality: N/A;  . PROSTATECTOMY      Family History  Problem Relation Age of Onset  . Diabetes Father   . Stroke Sister   . Heart disease Sister   . Heart disease Brother   . Heart attack Brother   . Stroke Brother   . Hypertension Neg Hx     Social History   Socioeconomic History  . Marital status: Married    Spouse name: Not on file  . Number of children: 5  . Years of education: Not on file  . Highest education level: Not on file  Occupational History  . Occupation: RETIRED    Employer: RETIRED    Comment: [employer]  . Financial resource strain: Not hard at all  . Food insecurity:    Worry: Never true    Inability: Never true  . Transportation needs:    Medical: No    Non-medical: No  Tobacco Use  . Smoking status: Former Smoker    Packs/day: 0.75    Years: 65.00    Pack years: 48.75    Types: Cigarettes    Last attempt to quit: 11/09/1997    Years since quitting: 20.7  . Smokeless tobacco: Former Systems developer    Quit date: 10/29/2008  Substance and Sexual Activity  . Alcohol use: No  . Drug use: No  . Sexual activity: Not Currently  Lifestyle  . Physical activity:    Days per week: 0 days    Minutes per session: 0 min  . Stress: Not on file  Relationships  . Social connections:    Talks on phone: Never    Gets together: More than three times a week    Attends religious service: Never    Active member of club or organization: Yes    Attends meetings of clubs or organizations: Never    Relationship status: Married  . Intimate partner violence:    Fear of current or ex partner: Not on file    Emotionally abused: Not on file    Physically abused: Not on file    Forced sexual activity: Not on file  Other Topics Concern  . Not on file  Social History Narrative  . Not on file     Physical Exam  Vital Signs and Nursing Notes reviewed Vitals:    08/09/18 1115 08/09/18 1415  BP: (!) 148/78 (!) 165/81  Pulse: (!) 59 62  Resp: (!) 27 (!) 22  SpO2: 95% 100%    CONSTITUTIONAL: Chronically ill-appearing, NAD NEURO: Mumbling speech, largely incoherent, moving all extremities, somnolent EYES:  eyes equal and reactive ENT/NECK:  no LAD, no JVD CARDIO: Regular rate, well-perfused, normal S1 and S2 PULM:  CTAB no wheezing or rhonchi GI/GU:  normal bowel sounds, non-distended, non-tender MSK/SPINE:  No gross deformities, no edema SKIN:  no rash, atraumatic PSYCH:  Appropriate speech and behavior  Diagnostic and  Interventional Summary    EKG Interpretation  Date/Time:  Tuesday August 09 2018 10:02:07 EDT Ventricular Rate:  105 PR Interval:    QRS Duration: 93 QT Interval:  404 QTC Calculation: 559 R Axis:   141 Text Interpretation:  A-V dual-paced complexes w/ some inhibition No further analysis attempted due to paced rhythm Confirmed by Gerlene Fee [phone]) on 08/09/2018 12:14:34 PM      Labs Reviewed  COMPREHENSIVE METABOLIC PANEL - Abnormal; Notable for the following components:      Result Value   BUN 30 (*)    Creatinine, Ser 2.55 (*)    Albumin 3.2 (*)    Total Bilirubin 1.3 (*)    GFR calc non Af Amer 20 (*)    GFR calc Af Amer 24 (*)    All other components within normal limits  CBC - Abnormal; Notable for the following components:   RBC 3.45 (*)    Hemoglobin 11.0 (*)    HCT 33.6 (*)    All other components within normal limits  PROTIME-INR - Abnormal; Notable for the following components:   Prothrombin Time 15.5 (*)    All other components within normal limits  URINALYSIS, ROUTINE W REFLEX MICROSCOPIC  BLOOD GAS, VENOUS  CBG MONITORING, ED  CBG MONITORING, ED  I-STAT TROPONIN, ED  I-STAT CG4 LACTIC ACID, ED  I-STAT TROPONIN, ED    CT Head Wo Contrast  Final Result    DG Chest Port 1 View  Final Result      Medications  sodium chloride 0.9 % bolus 500 mL (500 mLs Intravenous New Bag/Given  08/09/18 1432)     Procedures Critical Care  ED Course and Medical Decision Making  I have reviewed the triage vital signs and the nursing notes.  Pertinent labs & imaging results that were available during my care of the patient were reviewed by me and considered in my medical decision making (see below for details).  Altered mental status of unknown etiology in this 84 year old male, considering intracranial process, metabolic disarray, infection.  Will need to obtain more information from rehab facility.  No answer at facility.  Labs reveal AKI, patient has not urinated during his 4 to 5-hour ED visit.  Given IV fluids here, admitted to hospitalist service for hydration and further evaluation of altered mental status.  Barth Kirks. [place] Small, [place] [email or web address]  Final Clinical Impressions(s) / ED Diagnoses     ICD-10-CM   1. AKI (acute kidney injury) ([place]) N17.9   2. Altered awareness, transient R40.4 DG Chest [facility] 1 View    DG Chest Port 1 View  3. Dehydration E86.0     ED Discharge Orders    None         Maudie Flakes, MD 08/09/18 1525

## 2018-08-09 NOTE — Progress Notes (Signed)
1950 received patient from Ed, A&O x 3. Constant stream of chatter. No complaints of pain, agitated when rolling to side for assessment, threatened to hit nurse.  Patient settled after exam.  Will continue to monitor.

## 2018-08-09 NOTE — ED Notes (Signed)
Pt is able provide UA on own ability.

## 2018-08-09 NOTE — ED Notes (Signed)
Pt in CT.

## 2018-08-09 NOTE — ED Notes (Signed)
Family says pat hasnt been eating and drinking.  He is on a thickened water diet at facility.    Pt says he has trouble peeing.  It won't come out and it hurts.

## 2018-08-09 NOTE — H&P (Signed)
History and Physical    John Peck WCB:[id number] DOB: Apr 19, 1924 DOA: 08/09/2018  PCP: Seward Carol, MD Consultants:  none Patient coming from: [place]  Chief Complaint: AMS  HPI: John Peck is a 84 y.o. male with medical history significant for mild dementia, CAD s/p CABG, last PCI 2004, Afib and SSS with pacer, HTN, CKD III, hypothyroidism who was recently discharged from [facility] after CHF exacerbation who was brought to the ED today from his ill nursing facility due to confusion and altered mental status over the past 2 days.  According to the staff at [facility], patient has been talking to people that are not there, apparently seeing things that are not there, and has had decreased p.o. Intake.  No reports of any fevers or other vital sign abnormalities.  He has been on no new medications and has not been taken off of any of his usual medications.  His daughter and son are in the room and states that patient has been more talkative today than usual but that what he is saying does not make any sense.  At baseline patient has mild dementia but has no known history of delirium.  During his last hospitalization he was treated for CHF exacerbation and developed AKI.  Renal function improved with holding Lasix. He was discharged with instructions to hold Lasix and restart based on creatinine and daily weights.  Upon review of his MAR from [facility], he was restarted on his Lasix 30 mg twice daily but it is unclear how long he is been back on it. Creatinine at discharge was 2.12, down from a peak of 2.35.  Creatinine on admission last utilization was 1.31.   ED Course: Vital signs are stable, heart rate 50s to 60s, respiratory rate 19-27, blood pressure 148-166/70-81.  O2 saturations were in the high 90s to 100 on room air.  CT of the head showed nothing acute, chest x-ray showed interval mild worsening of his known right pleural effusion.  Lactic acid was within normal limits.  He was  given a 500 cc normal saline bolus.  He continued to have waxing and waning mental status.  Review of Systems: As per HPI; otherwise review of systems reviewed and negative.    Past Medical History:  Diagnosis Date  . Anemia   . Arthritis   . CAD (coronary artery disease)    a. CABG '89; b. PCI '04; c. 11/2015 Cath/PCI: LM 20ost, LAD 122m, LCX 30p, OM2 40, RCA 30p, 10p ISR, 90d (3.0x12 Synergy DES), AM 90, VG->OM2 known to be 100, LIMA->LAD not injected, patent in 2015.  . Carotid stenosis, right   . Chronic combined systolic and diastolic CHF (congestive heart failure) ([place])   . CKD (chronic kidney disease), stage III ([place])   . H/O: GI bleed    REMOTE HISTORY  . History of COPD   . Hyperlipidemia   . Hypertensive heart disease   . LBBB (left bundle branch block)   . Pacemaker Sept 2009   St Jude  . PAF (paroxysmal atrial fibrillation) (HCC)    a. not on anticoag due to prior GIB.  . Sick sinus syndrome [facility]) Sept 2009   ST Jude PTVDP  . Stroke [facility])     Past Surgical History:  Procedure Laterality Date  . CARDIAC CATHETERIZATION  11/2011   EF 50%; significant native CAD w/80% stenosis of the LAD after 2nd giagonal vessel and septal perforating artery w/total occlusion of the mid left anterior descendiung.;  60-70% ostiasl stenosis in the circumflex vessel followed by 40% proximal stenosis and 70-80% distal circumflex stenosis;   . CARDIAC CATHETERIZATION N/A 11/29/2015   Procedure: Left Heart Cath and Cors/Grafts Angiography;  Surgeon: Burnell Blanks, MD;  Location: [place] CV LAB;  Service: Cardiovascular;  Laterality: N/A;  . CARDIAC CATHETERIZATION N/A 11/29/2015   Procedure: Coronary Stent Intervention;  Surgeon: Burnell Blanks, MD;  Location: [place] CV LAB;  Service: Cardiovascular;  Laterality: N/A;  . CATARACT EXTRACTION  2004  . CORONARY ANGIOGRAPHY N/A 02/25/2017   Procedure: Coronary Angiography;  Surgeon: Belva Crome, MD;  Location: [place]  CV LAB;  Service: Cardiovascular;  Laterality: N/A;  . CORONARY ANGIOPLASTY WITH STENT PLACEMENT  2004   RCA  . CORONARY ARTERY BYPASS GRAFT  1989   had LIMA to his LAD, a vein to the circumflex. In 2004 underwent stenting to his right coronary artery.  . CORONARY STENT INTERVENTION N/A 02/24/2017   Procedure: Coronary Stent Intervention;  Surgeon: Wellington Hampshire, MD;  Location: [place] CV LAB;  Service: Cardiovascular;  Laterality: N/A;  cfx  . HEMORRHOID SURGERY    . INSERT / REPLACE / REMOVE PACEMAKER  07/17/08   DUAL-CHAMBER; PPM-ST.JUDE MEDNET  . IR THORACENTESIS ASP PLEURAL SPACE W/IMG GUIDE  05/25/2018  . IR THORACENTESIS ASP PLEURAL SPACE W/IMG GUIDE  08/01/2018  . LEFT HEART CATH AND CORS/GRAFTS ANGIOGRAPHY N/A 02/24/2017   Procedure: Left Heart Cath and Cors/Grafts Angiography;  Surgeon: Wellington Hampshire, MD;  Location: [place] CV LAB;  Service: Cardiovascular;  Laterality: N/A;  . LEFT HEART CATHETERIZATION WITH CORONARY/GRAFT ANGIOGRAM N/A 12/10/2011   Procedure: LEFT HEART CATHETERIZATION WITH Beatrix Fetters;  Surgeon: Troy Sine, MD;  Location: [facility] CATH LAB;  Service: Cardiovascular;  Laterality: N/A;  . LEFT HEART CATHETERIZATION WITH CORONARY/GRAFT ANGIOGRAM N/A 01/26/2014   Procedure: LEFT HEART CATHETERIZATION WITH Beatrix Fetters;  Surgeon: Troy Sine, MD;  Location: [facility] CATH LAB;  Service: Cardiovascular;  Laterality: N/A;  . PPM GENERATOR CHANGEOUT N/A 12/15/2017   Procedure: PPM GENERATOR CHANGEOUT;  Surgeon: Evans Lance, MD;  Location: [place] CV LAB;  Service: Cardiovascular;  Laterality: N/A;  . PROSTATECTOMY      Social History   Socioeconomic History  . Marital status: Married    Spouse name: Not on file  . Number of children: 5  . Years of education: Not on file  . Highest education level: Not on file  Occupational History  . Occupation: RETIRED    Employer: RETIRED    Comment: [employer]  . Financial  resource strain: Not hard at all  . Food insecurity:    Worry: Never true    Inability: Never true  . Transportation needs:    Medical: No    Non-medical: No  Tobacco Use  . Smoking status: Former Smoker    Packs/day: 0.75    Years: 65.00    Pack years: 48.75    Types: Cigarettes    Last attempt to quit: 11/09/1997    Years since quitting: 20.7  . Smokeless tobacco: Former Systems developer    Quit date: 10/29/2008  Substance and Sexual Activity  . Alcohol use: No  . Drug use: No  . Sexual activity: Not Currently  Lifestyle  . Physical activity:    Days per week: 0 days    Minutes per session: 0 min  . Stress: Not on file  Relationships  . Social connections:    Talks  on phone: Never    Gets together: More than three times a week    Attends religious service: Never    Active member of club or organization: Yes    Attends meetings of clubs or organizations: Never    Relationship status: Married  . Intimate partner violence:    Fear of current or ex partner: Not on file    Emotionally abused: Not on file    Physically abused: Not on file    Forced sexual activity: Not on file  Other Topics Concern  . Not on file  Social History Narrative  . Not on file    No Known Allergies  Family History  Problem Relation Age of Onset  . Diabetes Father   . Stroke Sister   . Heart disease Sister   . Heart disease Brother   . Heart attack Brother   . Stroke Brother   . Hypertension Neg Hx     Prior to Admission medications   Medication Sig Start Date End Date Taking? Authorizing Provider  acetaminophen (TYLENOL) 325 MG tablet Take 650 mg by mouth 3 (three) times daily.   Yes [provider]  albuterol (PROVENTIL HFA;VENTOLIN HFA) 108 (90 Base) MCG/ACT inhaler Inhale 2 puffs into the lungs every 4 (four) hours as needed for wheezing or shortness of breath (or coughing). 05/27/18  Yes Emokpae, Courage, MD  ALPRAZolam (XANAX) 0.25 MG tablet Take 1 tablet (0.25 mg total) by mouth 2  (two) times daily as needed for anxiety. 05/27/18  Yes Emokpae, Courage, MD  aspirin 81 MG tablet Take 81 mg by mouth daily.    Yes [provider]  atorvastatin (LIPITOR) 40 MG tablet Take 1 tablet (40 mg total) by mouth daily. Patient taking differently: Take 40 mg by mouth every evening.  09/18/15  Yes Troy Sine, MD  carvedilol (COREG) 12.5 MG tablet Take 1 tablet (12.5 mg total) by mouth 2 (two) times daily with a meal. 05/27/18  Yes Emokpae, Courage, MD  clopidogrel (PLAVIX) 75 MG tablet Take 1 tablet (75 mg total) by mouth daily. 05/27/18  Yes Emokpae, Courage, MD  ezetimibe (ZETIA) 10 MG tablet Take 10 mg by mouth daily.   Yes [provider]  ferrous sulfate 325 (65 FE) MG tablet Take 1 tablet (325 mg total) by mouth 2 (two) times daily with a meal. 05/27/18  Yes Emokpae, Courage, MD  furosemide (LASIX) 20 MG tablet Take 30 mg by mouth 2 (two) times daily.   Yes [provider]  ipratropium-albuterol (DUONEB) 0.5-2.5 (3) MG/3ML SOLN Take 3 mLs by nebulization 2 (two) times daily. 02/24/18  Yes Mikhail, Velta Addison, DO  isosorbide mononitrate (IMDUR) 30 MG 24 hr tablet Take 3 tablets (90 mg total) by mouth daily. 05/28/18  Yes Emokpae, Courage, MD  latanoprost (XALATAN) 0.005 % ophthalmic solution Place 1 drop into both eyes 2 (two) times a week. On mondays and wednesdays 11/07/15  Yes [provider]  levothyroxine (SYNTHROID, LEVOTHROID) 125 MCG tablet TAKE 1 TABLET BY MOUTH EVERY DAY BEFORE BREAKFAST Patient taking differently: Take 125 mcg by mouth daily before breakfast.  04/05/18  Yes Troy Sine, MD  nitroGLYCERIN (NITROSTAT) 0.4 MG SL tablet Place 1 tablet (0.4 mg total) under the tongue every 5 (five) minutes as needed for chest pain. 07/06/18  Yes Isaac Bliss, Rayford Halsted, MD  pantoprazole (PROTONIX) 40 MG tablet Take 1 tablet (40 mg total) by mouth daily. 05/28/18  Yes Roxan Hockey, MD  polyethylene glycol (MIRALAX /  GLYCOLAX) packet Take 17 g  by mouth daily. 05/27/18  Yes Emokpae, Courage, MD  ranolazine (RANEXA) 500 MG 12 hr tablet Take 1 tablet (500 mg total) by mouth 2 (two) times daily. 08/02/18  Yes Danford, Suann Larry, MD  senna (SENOKOT) 8.6 MG tablet Take 2 tablets by mouth 2 (two) times daily as needed for constipation.   Yes [provider]  tamsulosin (FLOMAX) 0.4 MG CAPS capsule Take 1 capsule (0.4 mg total) by mouth daily. 05/27/18  Yes Emokpae, Courage, MD  Amino Acids-Protein Hydrolys (FEEDING SUPPLEMENT, PRO-STAT SUGAR FREE 64,) LIQD Take 30 mLs by mouth 3 (three) times daily with meals. Patient not taking: Reported on 08/09/2018 08/02/18   Edwin Dada, MD  feeding supplement, ENSURE COMPLETE, (ENSURE COMPLETE) LIQD Take 237 mLs by mouth 2 (two) times daily between meals. Patient not taking: Reported on 08/09/2018 08/02/18   Edwin Dada, MD    Physical Exam: Vitals:   08/09/18 0956 08/09/18 1003 08/09/18 1004 08/09/18 1115  BP:   (!) 165/79 (!) 148/78  Pulse:   (!) 59 (!) 59  Resp:   20 (!) 27  SpO2: 95% 100% 100% 95%     . General: Elderly male, in mild to moderate distress, occasionally agitated . Eyes:  PERRL, EOMI, normal lids, iris . ENT:  grossly normal hearing, lips & tongue, mmm; appropriate dentition . Neck:  no LAD, masses or thyromegaly; no carotid bruits . Cardiovascular:  normal rate, reg rhythm, no murmur. Marland Kitchen Respiratory:  CTA bilaterally with no wheezes/rales/rhonchi.  Diminished breath sounds at bases, right more than left.  Normal respiratory effort. . Abdomen:  soft, NT, ND, NABS . Back:   grossly normal alignment, no CVAT . Skin:  no rash or induration seen on limited exam . Musculoskeletal:  grossly normal tone BUE/BLE, good ROM, no bony abnormality or obvious joint deformity . Lower extremity:  No LE edema.  Limited foot exam with no ulcerations.  2+ distal pulses. Marland Kitchen Psychiatric: Anxious and tearful at times when speaking with his son.  Initially he was alert  and oriented to place and year.  A few minutes later he was agitated, disoriented, talking to people that were not in the room. Marland Kitchen Neurologic:  CN 2-12 grossly intact, moves all extremities in coordinated fashion, sensation intact    Radiological Exams on Admission: Ct Head Wo Contrast  Result Date: 08/09/2018 CLINICAL DATA:  Altered level of consciousness. Confusion. Nonverbal. EXAM: CT HEAD WITHOUT CONTRAST TECHNIQUE: Contiguous axial images were obtained from the base of the skull through the vertex without intravenous contrast. COMPARISON:  06/29/2018 FINDINGS: Brain: Within limitations of artifact created by the patient's right hand being placed on the head, there is no evidence of acute infarct, intracranial hemorrhage, mass, midline shift, or extra-axial fluid collection. Mild cerebral atrophy is within normal limits for age. Periventricular white matter hypodensities were more conspicuous on the prior study, nonspecific but likely reflecting mild chronic small vessel ischemic disease. Vascular: Calcified atherosclerosis at the skull base. No hyperdense vessel. Skull: No fracture or focal osseous lesion. Sinuses/Orbits: Mild right ethmoid air cell mucosal thickening. Visualized mastoid air cells are clear. Prior right cataract extraction. Other: Partially visualized 1.7 cm low-density subcutaneous nodule posterior to the right parotid gland, grossly unchanged in size from a 02/23/2018 neck CT and likely reflecting a sebaceous cyst or similar. IMPRESSION: No evidence of acute intracranial abnormality. Electronically Signed   By: Logan Bores M.D.   On: 08/09/2018 12:20   Dg Chest  Port 1 View  Result Date: 08/09/2018 CLINICAL DATA:  Altered awareness. EXAM: PORTABLE CHEST 1 VIEW COMPARISON:  08/01/2018. FINDINGS: Cardiac pacer with lead tips in right atrium right ventricle. Prior CABG. Cardiomegaly with mild bilateral interstitial prominence and prominent right pleural effusion. Tiny left pleural  effusion cannot be excluded. Findings suggest CHF. Findings have worsened from prior exam. Pneumonitis cannot be excluded. No pneumothorax. IMPRESSION: 1. Cardiac pacer with lead tips over the right atrium right ventricle. Prior CABG. Cardiomegaly with diffuse mild bilateral interstitial prominence and prominent right pleural effusion. Small left pleural effusion. Findings suggest CHF. Findings have progressed from prior exam. Electronically Signed   By: [place]   On: 08/09/2018 10:59    EKG: Independently reviewed.  Rate 105 A-V dual-paced complexes w/ some inhibition No further analysis attempted due to paced rhythm   Labs on Admission: I have personally reviewed the available labs and imaging studies at the time of the admission.  Pertinent labs:  Sodium 138 potassium 4.4 chloride 101 CO2 26 glucose 94 BUN 30 creatinine 2.55 (up from a recent creat of 2.1-2.3, previous baseline of 1.1-1.3 in mid September 2019) calcium 9.0 albumin 3.2 Troponin 0.05 -> 0.06 Lactic acid 1.32 WBC 4.7 hemoglobin 11 platelets 214 INR 1.24 BNP 1084.8 Urinalysis: Small leukocytes, negative nitrite, 21-50 WBC  Assessment/Plan Principal Problem:   Altered mental status Active Problems:   Essential hypertension   PACEMAKER, PERMANENT- St Jude Sept 2009   History of iron deficiency anemia   PAF (paroxysmal atrial fibrillation) (HCC)   Hyperlipidemia LDL goal <70   CAD in native artery   Hypertensive heart disease   Acute on chronic combined systolic and diastolic CHF (congestive heart failure) ([place])   COPD clinical dx/ no pfts on record   CKD (chronic kidney disease), stage III (HCC)   Hypothyroidism   Pleural effusion   Delirium   UTI (urinary tract infection)   Altered mental status/delirium: This is likely due to UTI.  VBG shows no hypercarbia.  Ammonia level is within normal limits.  He also has an AKI which could be contributing.  Head CT is negative for acute process. TSH within normal  limits -Treat urinary tract infection with ceftriaxone 1 g every 24 hours.  His last few UTIs have grown Proteus which is been sensitive to ceftriaxone. -Follow-up urine culture -Follow-up blood cultures x2 -Avoid psychoactive meds -Provide frequent reorientation -Avoid restraints if at all possible  Acute on chronic combined systolic and diastolic CHF  BNP is greater than 1000, pleural effusions have worsened. -Likely due to missing several doses of Lasix due to elevated creatinine -Give 1 dose of Lasix IV 40 mg now, continue oral Lasix 30 mg twice daily subsequently, and closely monitor renal function, strict I's and O's, daily weights.  -Sodium restriction, 1800 cc fluid restriction  Hypertension: -Continue carvedilol, Imdur  Coronary artery disease:  -Continue beta-blocker, Imdur, Plavix, aspirin, Ranexa  Chronic bilateral pleural effusions R>L: These have been transudative in the past, consistent with CHF.  Last thoracentesis culture was negative. -diuresis as above -Supplemental O2 as needed  Paroxysmal A. fib: Not on chronic anticoagulation due to GI bleed.  Status post permanent pacemaker. -Continue aspirin, beta-blocker  Clinical COPD, no PFTs on record -Duo nebs as needed  Hyperlipidemia -Continue home Lipitor, Zetia  Hypothyroidism -Continue home levothyroxine    DVT prophylaxis: Heparin [place] Code Status: DNR confirmed with patient/family Family Communication: Daughter and son at bedside Disposition Plan: LikelyHome once clinically improved Consults called: None Admission  status: Admit - It is my clinical opinion that admission to INPATIENT is reasonable and necessary because of the expectation that this patient will require hospital care that crosses at least 2 midnights to treat this condition based on the medical complexity of the problems presented.  Given the aforementioned information, the predictability of an adverse outcome is felt to be significant.      Janora Norlander MD [facility]  If note is complete, please contact covering daytime or nighttime physician. [email or web address] Password [email or web address]  08/09/2018, 2:34 PM

## 2018-08-09 NOTE — ED Triage Notes (Signed)
Not himself today.  Not sure what his baseline is.  He is mumbling.

## 2018-08-10 ENCOUNTER — Encounter (HOSPITAL_COMMUNITY): Payer: Self-pay | Admitting: General Practice

## 2018-08-10 DIAGNOSIS — G9341 Metabolic encephalopathy: Principal | ICD-10-CM

## 2018-08-10 DIAGNOSIS — N390 Urinary tract infection, site not specified: Secondary | ICD-10-CM

## 2018-08-10 DIAGNOSIS — I10 Essential (primary) hypertension: Secondary | ICD-10-CM

## 2018-08-10 LAB — BASIC METABOLIC PANEL
Anion gap: 13 (ref 5–15)
BUN: 30 mg/dL — ABNORMAL HIGH (ref 8–23)
CO2: 22 mmol/L (ref 22–32)
Calcium: 8.7 mg/dL — ABNORMAL LOW (ref 8.9–10.3)
Chloride: 102 mmol/L (ref 98–111)
Creatinine, Ser: 2.34 mg/dL — ABNORMAL HIGH (ref 0.61–1.24)
GFR calc Af Amer: 26 mL/min — ABNORMAL LOW (ref >60)
GFR calc non Af Amer: 23 mL/min — ABNORMAL LOW (ref >60)
Glucose, Bld: 76 mg/dL (ref 70–99)
Potassium: 4.4 mmol/L (ref 3.5–5.1)
Sodium: 137 mmol/L (ref 135–145)

## 2018-08-10 LAB — CBC
HCT: 32.9 % — ABNORMAL LOW (ref 39.0–52.0)
Hemoglobin: 10.9 g/dL — ABNORMAL LOW (ref 13.0–17.0)
MCH: 32.1 pg (ref 26.0–34.0)
MCHC: 33.1 g/dL (ref 30.0–36.0)
MCV: 96.8 fL (ref 78.0–100.0)
Platelets: 231 10*3/uL (ref 150–400)
RBC: 3.4 MIL/uL — ABNORMAL LOW (ref 4.22–5.81)
RDW: 14 % (ref 11.5–15.5)
WBC: 4.5 10*3/uL (ref 4.0–10.5)

## 2018-08-10 MED ORDER — FUROSEMIDE 10 MG/ML IJ SOLN
40.0000 mg | Freq: Every day | INTRAMUSCULAR | Status: DC
  Administered 2018-08-10: 40 mg via INTRAVENOUS
  Filled 2018-08-10: qty 4

## 2018-08-10 NOTE — NC FL2 (Signed)
[place] LEVEL OF CARE SCREENING TOOL     IDENTIFICATION  Patient Name: John Peck Birthdate: [birth year] Sex: male Admission Date (Current Location): 08/09/2018  [facility] and [place] Number:  Herbalist and Address:  [facility]. [place], [place] [address] [place], [place] [address]      Provider Number: [id number]  Attending Physician Name and Address:  Louellen Molder, MD  Relative Name and Phone Number:  Elisha Cooksey; daughter; [phone]    Current Level of Care: Hospital Recommended Level of Care: [place] Prior Approval Number:    Date Approved/Denied:   PASRR Number:    Discharge Plan: SNF    Current Diagnoses: Patient Active Problem List   Diagnosis Date Noted  . Altered mental status 08/09/2018  . Delirium 08/09/2018  . UTI (urinary tract infection) 08/09/2018  . Right flank pain   . Goals of care, counseling/discussion   . Palliative care encounter   . Chest pain 07/29/2018  . TIA (transient ischemic attack) 06/29/2018  . Pressure injury of skin 05/25/2018  . Pleural effusion 05/24/2018  . Acute respiratory failure with hypoxia ([place]) 05/24/2018  . NSTEMI (non-ST elevated myocardial infarction) ([place])   . Acute on chronic combined systolic and diastolic CHF (congestive heart failure) ([place]) 03/14/2018  . COPD clinical dx/ no pfts on record 03/14/2018  . H/O: GI bleed 03/14/2018  . CKD (chronic kidney disease), stage III ([place]) 03/14/2018  . Paroxysmal atrial fibrillation ([place]) 03/14/2018  . Hypothyroidism 03/14/2018  . Carotid stenosis, right 03/14/2018  . Facial droop 02/22/2018  . Pancytopenia ([place]) 02/22/2018  . Generalized weakness   . Hypertensive heart disease   . Hyperlipidemia   . Stented coronary artery   . Coronary artery disease involving native coronary artery of native heart with unstable angina pectoris ([place])   . Reactive airway disease 11/27/2015  . CAD in native artery  09/03/2015  . Weakness 05/09/2015  . Hyperlipidemia LDL goal <70 02/28/2015  . Atherosclerosis of lower extremity with claudication ([place]) 07/17/2014  . PAF (paroxysmal atrial fibrillation) ([place]) 03/24/2013  . Lower extremity edema 03/24/2013  . Sinoatrial node dysfunction ([place]) 08/17/2012  . History of iron deficiency anemia 09/18/2011  . Essential hypertension 07/07/2009  . INTERMEDIATE CORONARY SYNDROME 07/07/2009  . S/P CABG x 2 '89. RCA stent'04. last cath Jan 2013 07/07/2009  . PACEMAKER, PERMANENT- St Jude Sept 2009 07/07/2009  . Sick sinus syndrome ([place]) 07/10/2008  . Pacemaker 07/10/2008    Orientation RESPIRATION BLADDER Height & Weight     Self, Situation, Place  Normal Incontinent, External catheter Weight: 139 lb (63 kg) Height:  5\' 9"  (175.3 cm)  BEHAVIORAL SYMPTOMS/MOOD NEUROLOGICAL BOWEL NUTRITION STATUS      Continent Diet(see discharge summary)  AMBULATORY STATUS COMMUNICATION OF NEEDS Skin   Extensive Assist Verbally PU Stage and Appropriate Care                       Personal Care Assistance Level of Assistance  Bathing, Feeding, Dressing Bathing Assistance: Maximum assistance Feeding assistance: Limited assistance Dressing Assistance: Maximum assistance     Functional Limitations Info  Sight, Hearing, Speech Sight Info: Adequate Hearing Info: Adequate Speech Info: Adequate    SPECIAL CARE FACTORS FREQUENCY  PT (By licensed PT), OT (By licensed OT)     PT Frequency: 5x week OT Frequency: 5x week            Contractures Contractures Info: Not present  Additional Factors Info  Code Status, Allergies Code Status Info: DNR Allergies Info: No Known Allergies           Current Medications (08/10/2018):  This is the current hospital active medication list Current Facility-Administered Medications  Medication Dose Route Frequency Provider Last Rate Last Dose  . 0.9 %  sodium chloride infusion  250 mL Intravenous PRN Janora Norlander,  MD      . acetaminophen (TYLENOL) tablet 650 mg  650 mg Oral TID Janora Norlander, MD   650 mg at 08/10/18 1123  . albuterol (PROVENTIL) (2.5 MG/3ML) 0.083% nebulizer solution 3 mL  3 mL Inhalation Q4H PRN Janora Norlander, MD      . aspirin chewable tablet 81 mg  81 mg Oral Daily Janora Norlander, MD   81 mg at 08/10/18 1124  . atorvastatin (LIPITOR) tablet 40 mg  40 mg Oral QPM Janora Norlander, MD   40 mg at 08/09/18 1704  . carvedilol (COREG) tablet 12.5 mg  12.5 mg Oral BID WC Janora Norlander, MD   12.5 mg at 08/10/18 0848  . clopidogrel (PLAVIX) tablet 75 mg  75 mg Oral Daily Janora Norlander, MD   75 mg at 08/10/18 1125  . ezetimibe (ZETIA) tablet 10 mg  10 mg Oral Daily Janora Norlander, MD   10 mg at 08/10/18 1334  . ferrous sulfate tablet 325 mg  325 mg Oral BID WC Janora Norlander, MD   325 mg at 08/10/18 0848  . furosemide (LASIX) injection 40 mg  40 mg Intravenous Once Janora Norlander, MD      . furosemide (LASIX) injection 40 mg  40 mg Intravenous Daily Dhungel, Nishant, MD   40 mg at 08/10/18 1338  . heparin injection 5,000 Units  5,000 Units Subcutaneous Q8H Janora Norlander, MD   5,000 Units at 08/10/18 1335  . ipratropium-albuterol (DUONEB) 0.5-2.5 (3) MG/3ML nebulizer solution 3 mL  3 mL Nebulization BID Janora Norlander, MD   3 mL at 08/09/18 2217  . ipratropium-albuterol (DUONEB) 0.5-2.5 (3) MG/3ML nebulizer solution 3 mL  3 mL Nebulization Q6H PRN Janora Norlander, MD   3 mL at 08/09/18 1826  . isosorbide mononitrate (IMDUR) 24 hr tablet 90 mg  90 mg Oral Daily Janora Norlander, MD   90 mg at 08/10/18 1124  . latanoprost (XALATAN) 0.005 % ophthalmic solution 1 drop  1 drop Both Eyes Once per day on Mon Wed [place], Sally M, MD      . levothyroxine (SYNTHROID, LEVOTHROID) tablet 125 mcg  125 mcg Oral QAC breakfast Janora Norlander, MD   125 mcg at 08/10/18 0848  . nitroGLYCERIN (NITROSTAT) SL tablet 0.4 mg  0.4 mg Sublingual Q5 min PRN Janora Norlander, MD      . pantoprazole  (PROTONIX) EC tablet 40 mg  40 mg Oral Daily Janora Norlander, MD   40 mg at 08/10/18 1125  . polyethylene glycol (MIRALAX / GLYCOLAX) packet 17 g  17 g Oral Daily Janora Norlander, MD      . ranolazine [facility]) 12 hr tablet 500 mg  500 mg Oral BID Janora Norlander, MD   500 mg at 08/10/18 1125  . RESOURCE THICKENUP CLEAR   Oral PRN Blenda Nicely, [facility]      . senna (SENOKOT) tablet 17.2 mg  2 tablet Oral BID PRN Janora Norlander, MD      . sodium chloride flush (NS)  0.9 % injection 3 mL  3 mL Intravenous Q12H Janora Norlander, MD   3 mL at 08/10/18 1126  . sodium chloride flush (NS) 0.9 % injection 3 mL  3 mL Intravenous PRN Janora Norlander, MD      . tamsulosin [facility]) capsule 0.4 mg  0.4 mg Oral Daily Janora Norlander, MD   0.4 mg at 08/10/18 1125     Discharge Medications: Please see discharge summary for a list of discharge medications.  Relevant Imaging Results:  Relevant Lab Results:   Additional Information SS# [id number]  [place], [place]

## 2018-08-10 NOTE — Social Work (Signed)
Acknowledging pt arrival from [facility]   CSW will follow for disposition. [place], [place] Work [phone]

## 2018-08-10 NOTE — Plan of Care (Signed)
  Problem: Coping: Goal: Level of anxiety will decrease Outcome: Progressing   Problem: Elimination: Goal: Will not experience complications related to bowel motility Outcome: Progressing   Problem: Safety: Goal: Ability to remain free from injury will improve Outcome: Progressing   

## 2018-08-10 NOTE — Progress Notes (Signed)
PROGRESS NOTE                                                                                                                                                                                                             Patient Demographics:    John Peck, is a 84 y.o. male, DOB - 03/23/1924, SWH:[id number]  Admit date - 08/09/2018   Admitting Physician Janora Norlander, MD  Outpatient Primary MD for the patient is Seward Carol, MD  LOS - 1  Outpatient Specialists:  Chief Complaint  Patient presents with  . Altered Mental Status    Not acting himself       Brief Narrative 84 year old male with history of mild dementia, CAD status post CABG, A. fib, sick sinus syndrome status post pacemaker, hypertension, chronic kidney disease stage III, hypothyroidism recently discharged from the hospital with CHF exacerbation brought to the ED from skilled nursing facility due to altered mental status for the past 2 days.  Patient apparently was talking to people who were not there and also having poor p.o. intake.  No fevers, fall or head injury.  During his recent hospitalization he was treated for CHF exacerbation developed acute kidney injury which improved after Lasix was held. In the ED vitals were stable except for elevated systolic blood pressure.  Head CT unremarkable.  Chest x-ray showed interval worsening of his known right pleural effusion.  UA suspicious for UTI.  Patient was given 500 cc normal saline bolus and admitted for further management.   Subjective:   Patient reports some shortness of breath but denies any other symptoms.   Assessment  & Plan :    Principal Problem: Acute metabolic encephalopathy/acute delirium Has baseline mild dementia which has worsened.  Suspect this is associated with UTI.  Ammonia and TSH within normal limits. On empiric IV Rocephin.  Urine culture not sent on admission.  Previously has  had UTI growing Proteus.  Follow blood cultures.  Avoiding benzos or narcotics.  Patient was agitated last evening requiring a dose of Risperdal.   Active Problems: Acute on chronic combined systolic and diastolic CHF BNP >6659 with worsened right-sided pleural effusion.  Will place on IV Lasix 40 mg daily and monitor for now.  Strict I's/O and fluid restriction.  Acute kidney injury superimposed on chronic kidney disease stage III Baseline creatinine  around 1.3-  1.5 which had worsened during recent hospitalization with diuresis.  Creatinine of 2.34 this admission.  Monitor with diuresis.  Avoid nephrotoxins.  Essential hypertension Continue Coreg and Imdur  Coronary artery disease Asymptomatic.  Continue beta-blocker, Imdur, aspirin, Plavix and Ranexa.  Chronic bilateral pleural effusion R >L In the past and have been transudative on study.  Monitor with diuresis and if no improvement will need thoracentesis.  Paroxysmal A. fib Not on anticoagulation due to GI bleed.  Continue aspirin and beta-blocker  Chronic COPD Not on exacerbation.  Continue PRN nebs  Hyperlipidemia Continue statin  Hypothyroidism Continue Synthroid     Code Status : DNR  Family Communication  : None at bedside.  Will update family  Disposition Plan  : Discharge back to SNF possibly in 1-2 days once symptoms better  Barriers For Discharge : Active symptoms  Consults  : None  Procedures  : None  DVT Prophylaxis  : Subcu heparin  Lab Results  Component Value Date   PLT 231 08/10/2018    Antibiotics  :    Anti-infectives (From admission, onward)   None        Objective:   Vitals:   08/09/18 1715 08/09/18 2008 08/10/18 0542 08/10/18 0845  BP: (!) 156/80 (!) 163/77 (!) 156/63 (!) 161/65  Pulse: (!) 56 63 89 (!) 59  Resp: 19 18 18    Temp:  97.8 F (36.6 C) 98.1 F (36.7 C)   TempSrc:  Oral Oral   SpO2: (!) 87% 100% 95%   Weight:      Height:        Wt Readings from Last 3  Encounters:  08/09/18 63 kg  08/02/18 62.2 kg  06/29/18 65.3 kg     Intake/Output Summary (Last 24 hours) at 08/10/2018 1206 Last data filed at 08/10/2018 1126 Gross per 24 hour  Intake 3 ml  Output 370 ml  Net -367 ml     Physical Exam  Gen: not in distress HEENT: no pallor, moist mucosa, supple neck Chest: Fine bibasilar crackles CVS: S1 and S2 regular, no murmurs rubs or gallop GI: soft, NT, ND, BS+ Musculoskeletal: warm, no edema CNS: AAOX2, nonfocal    Data Review:    CBC Recent Labs  Lab 08/09/18 1007 08/10/18 0509  WBC 4.7 4.5  HGB 11.0* 10.9*  HCT 33.6* 32.9*  PLT 214 231  MCV 97.4 96.8  MCH 31.9 32.1  MCHC 32.7 33.1  RDW 14.1 14.0    Chemistries  Recent Labs  Lab 08/09/18 1007 08/10/18 0509  NA 138 137  K 4.4 4.4  CL 101 102  CO2 26 22  GLUCOSE 94 76  BUN 30* 30*  CREATININE 2.55* 2.34*  CALCIUM 9.0 8.7*  AST 18  --   ALT 8  --   ALKPHOS 59  --   BILITOT 1.3*  --    ------------------------------------------------------------------------------------------------------------------ No results for input(s): CHOL, HDL, LDLCALC, TRIG, CHOLHDL, LDLDIRECT in the last 72 hours.  Lab Results  Component Value Date   HGBA1C 5.5 02/23/2018   ------------------------------------------------------------------------------------------------------------------ Recent Labs    08/09/18 1629  TSH 3.389   ------------------------------------------------------------------------------------------------------------------ No results for input(s): VITAMINB12, FOLATE, FERRITIN, TIBC, IRON, RETICCTPCT in the last 72 hours.  Coagulation profile Recent Labs  Lab 08/09/18 1007  INR 1.24    No results for input(s): DDIMER in the last 72 hours.  Cardiac Enzymes No results for input(s): CKMB, TROPONINI, MYOGLOBIN in the last 168 hours.  Invalid input(s):  CK ------------------------------------------------------------------------------------------------------------------  Component Value Date/Time   BNP 1,084.8 (H) 08/09/2018 1629    Inpatient Medications  Scheduled Meds: . acetaminophen  650 mg Oral TID  . aspirin  81 mg Oral Daily  . atorvastatin  40 mg Oral QPM  . carvedilol  12.5 mg Oral BID WC  . clopidogrel  75 mg Oral Daily  . ezetimibe  10 mg Oral Daily  . ferrous sulfate  325 mg Oral BID WC  . furosemide  40 mg Intravenous Once  . furosemide  40 mg Intravenous Daily  . heparin  5,000 Units Subcutaneous Q8H  . ipratropium-albuterol  3 mL Nebulization BID  . isosorbide mononitrate  90 mg Oral Daily  . latanoprost  1 drop Both Eyes Once per day on Mon Wed  . levothyroxine  125 mcg Oral QAC breakfast  . pantoprazole  40 mg Oral Daily  . polyethylene glycol  17 g Oral Daily  . ranolazine  500 mg Oral BID  . sodium chloride flush  3 mL Intravenous Q12H  . tamsulosin  0.4 mg Oral Daily   Continuous Infusions: . sodium chloride     PRN Meds:.sodium chloride, albuterol, ipratropium-albuterol, nitroGLYCERIN, RESOURCE THICKENUP CLEAR, senna, sodium chloride flush  Micro Results Recent Results (from the past 240 hour(s))  Gram stain     Status: None   Collection Time: 08/01/18  2:35 PM  Result Value Ref [place] Status   Specimen Description PLEURAL RIGHT  Final   Special Requests NONE  Final   Gram Stain   Final    FEW WBC PRESENT,BOTH PMN AND MONONUCLEAR NO ORGANISMS SEEN Performed at [facility], [address] [place], [place] [address]    Report Status 08/01/2018 FINAL  Final  Culture, body fluid-bottle     Status: None   Collection Time: 08/01/18  2:35 PM  Result Value Ref [place] Status   Specimen Description PLEURAL RIGHT  Final   Special Requests NONE  Final   Culture   Final    NO GROWTH 5 DAYS Performed at [facility] [address], [place], [place] [address]    Report Status 08/06/2018  FINAL  Final  MRSA PCR Screening     Status: None   Collection Time: 08/09/18  8:33 PM  Result Value Ref [place] Status   MRSA by PCR NEGATIVE NEGATIVE Final    Comment:        The GeneXpert MRSA Assay (FDA approved for NASAL specimens only), is one component of a comprehensive MRSA colonization surveillance program. It is not intended to diagnose MRSA infection nor to guide or monitor treatment for MRSA infections. Performed at [facility], [place] [address] [place], [place] [address]     Radiology Reports Ct Abdomen Pelvis Wo Contrast  Result Date: 08/01/2018 CLINICAL DATA:  84 year old male with right flank pain. EXAM: CT ABDOMEN AND PELVIS WITHOUT CONTRAST TECHNIQUE: Multidetector CT imaging of the abdomen and pelvis was performed following the standard protocol without IV contrast. COMPARISON:  CT of the abdomen pelvis dated 08/10/2006 FINDINGS: Evaluation of this exam is limited in the absence of intravenous contrast. Lower chest: Partially visualized moderate to large right pleural effusion with compressive atelectasis of the majority of the visualized right lower lobe and partial compressive atelectasis of the right middle lobe. There is a trace left pleural effusion. Diffuse interstitial and interlobular septal prominence likely edema. There is borderline cardiomegaly and coronary vascular calcification. An AICD wire is partially seen. No intra-abdominal free air or  free fluid. Hepatobiliary: The liver is unremarkable. There is a stone in the gallbladder neck. No pericholecystic fluid. Ultrasound may provide better evaluation of the gallbladder. Pancreas: Unremarkable. No pancreatic ductal dilatation or surrounding inflammatory changes. Spleen: Normal in size without focal abnormality. Adrenals/Urinary Tract: The adrenal glands are unremarkable. Indeterminate bilobed or adjacent hypodense lesion in the inferior pole of the right kidney measuring approximately 3.7 x 2.3 cm. Further  characterization with ultrasound or MRI on a nonemergent basis recommended. The left kidney is unremarkable. There is no hydronephrosis or nephrolithiasis on either side. The urinary bladder is grossly unremarkable for the degree of distention. Stomach/Bowel: There is sigmoid diverticulosis without active inflammatory changes. Large amount of stool noted in the ascending and transverse colon. No bowel obstruction or active inflammation. The appendix is not visualized with certainty. No inflammatory changes identified in the right lower quadrant. Vascular/Lymphatic: There is advanced aortoiliac atherosclerotic disease. There is a fusiform infrarenal abdominal aortic aneurysm measuring 3.5 cm in diameter. Evaluation of the vasculature is limited on this noncontrast CT. No portal venous gas. There is no adenopathy. Reproductive: The prostate and seminal vesicles are grossly unremarkable. Other: None Musculoskeletal: Osteopenia with degenerative changes of the spine. No acute osseous pathology. IMPRESSION: 1. Stone within the gallbladder neck. Correlation with ultrasound recommended. 2. No hydronephrosis or nephrolithiasis. Right renal inferior pole hypodense lesions, indeterminate. Further evaluation with MRI on a nonemergent basis recommended. 3. Sigmoid diverticulosis. Moderate colonic stool burden. No bowel obstruction. 4. Large right pleural effusion with associated compressive atelectasis of the right lung base. Electronically Signed   By: Anner Crete M.D.   On: 08/01/2018 01:44   Dg Chest 1 View  Result Date: 08/01/2018 CLINICAL DATA:  Post  thora  Rt side EXAM: CHEST  1 VIEW COMPARISON:  07/31/2018 FINDINGS: Status post median sternotomy. Patient has a LEFT-sided transvenous pacemaker with leads to the RIGHT atrium and RIGHT ventricle. The heart is mildly enlarged. The RIGHT sided pleural effusion slightly smaller with no evidence for postprocedure pneumothorax. LEFT lung remains clear. IMPRESSION:  Smaller RIGHT pleural effusion.  No pneumothorax. Electronically Signed   By: Nolon Nations M.D.   On: 08/01/2018 14:45   Dg Chest 2 View  Result Date: 07/29/2018 CLINICAL DATA:  Shortness of breath and chest pain EXAM: CHEST - 2 VIEW COMPARISON:  July 02, 2018 FINDINGS: There is a pleural effusion on the right with atelectatic change in the right mid and lower lung zones. There is no frank consolidation. Heart is upper normal in size with pulmonary vascularity normal. Pacemaker leads are attached to the right atrium and right ventricle. Patient is status post internal mammary bypass grafting. No adenopathy. No bone lesions. IMPRESSION: Moderate right pleural effusion with right mid and lower lung zone atelectatic change. No frank consolidation. Stable cardiac silhouette. Pacemaker leads attached to right atrium and right ventricle. No pneumothorax. Electronically Signed   By: Lowella Grip III M.D.   On: 07/29/2018 12:05   Dg Ribs Bilateral W/chest  Result Date: 07/31/2018 CLINICAL DATA:  Right rib pain EXAM: BILATERAL RIBS AND CHEST - 4+ VIEW COMPARISON:  Chest radiographs dated 07/29/2018 FINDINGS: Moderate right pleural effusion. Trace left pleural effusion. No frank interstitial edema. No pneumothorax. Cardiomegaly.  Left subclavian pacemaker. No displaced rib fracture is seen. IMPRESSION: Moderate right pleural effusion.  Trace left pleural effusion. No displaced rib fracture is seen. Electronically Signed   By: Julian Hy M.D.   On: 07/31/2018 09:32   Ct Head Wo Contrast  Result  Date: 08/09/2018 CLINICAL DATA:  Altered level of consciousness. Confusion. Nonverbal. EXAM: CT HEAD WITHOUT CONTRAST TECHNIQUE: Contiguous axial images were obtained from the base of the skull through the vertex without intravenous contrast. COMPARISON:  06/29/2018 FINDINGS: Brain: Within limitations of artifact created by the patient's right hand being placed on the head, there is no evidence of acute  infarct, intracranial hemorrhage, mass, midline shift, or extra-axial fluid collection. Mild cerebral atrophy is within normal limits for age. Periventricular white matter hypodensities were more conspicuous on the prior study, nonspecific but likely reflecting mild chronic small vessel ischemic disease. Vascular: Calcified atherosclerosis at the skull base. No hyperdense vessel. Skull: No fracture or focal osseous lesion. Sinuses/Orbits: Mild right ethmoid air cell mucosal thickening. Visualized mastoid air cells are clear. Prior right cataract extraction. Other: Partially visualized 1.7 cm low-density subcutaneous nodule posterior to the right parotid gland, grossly unchanged in size from a 02/23/2018 neck CT and likely reflecting a sebaceous cyst or similar. IMPRESSION: No evidence of acute intracranial abnormality. Electronically Signed   By: Logan Bores M.D.   On: 08/09/2018 12:20   Dg Chest Port 1 View  Result Date: 08/09/2018 CLINICAL DATA:  Altered awareness. EXAM: PORTABLE CHEST 1 VIEW COMPARISON:  08/01/2018. FINDINGS: Cardiac pacer with lead tips in right atrium right ventricle. Prior CABG. Cardiomegaly with mild bilateral interstitial prominence and prominent right pleural effusion. Tiny left pleural effusion cannot be excluded. Findings suggest CHF. Findings have worsened from prior exam. Pneumonitis cannot be excluded. No pneumothorax. IMPRESSION: 1. Cardiac pacer with lead tips over the right atrium right ventricle. Prior CABG. Cardiomegaly with diffuse mild bilateral interstitial prominence and prominent right pleural effusion. Small left pleural effusion. Findings suggest CHF. Findings have progressed from prior exam. Electronically Signed   By: Marcello Moores  Register   On: 08/09/2018 10:59   Ir Thoracentesis Asp Pleural Space W/img Guide  Result Date: 08/01/2018 INDICATION: History of COPD, recurrent right sided pleural effusion with dyspnea requiring continuous oxygen via [place]. Request for  diagnostic and therapeutic thoracentesis today. EXAM: ULTRASOUND GUIDED RIGHT THORACENTESIS MEDICATIONS: 10 mL 1% lidocaine. COMPLICATIONS: None immediate. PROCEDURE: An ultrasound guided thoracentesis was thoroughly discussed with the patient and questions answered. The benefits, risks, alternatives and complications were also discussed. The patient understands and wishes to proceed with the procedure. Written consent was obtained. Ultrasound was performed to localize and mark an adequate pocket of fluid in the right chest. The area was then prepped and draped in the normal sterile fashion. 1% Lidocaine was used for local anesthesia. Under ultrasound guidance a 6 Fr Safe-T-Centesis catheter was introduced. Thoracentesis was performed. The catheter was removed and a dressing applied. FINDINGS: A total of approximately 1.1L of amber fluid was removed. Samples were sent to the laboratory as requested by the clinical team. IMPRESSION: Successful ultrasound guided right thoracentesis yielding 1.1 L of pleural fluid. Read by Candiss Norse, PA-C Electronically Signed   By: Jacqulynn Cadet M.D.   On: 08/01/2018 14:53    Time Spent in minutes  25   Isley Weisheit M.D on 08/10/2018 at 12:06 PM  Between 7am to 7pm - Pager - [phone]  After 7pm go to [email or web address] - password [email or web address]  [facility] -  Office  [phone]

## 2018-08-11 DIAGNOSIS — N179 Acute kidney failure, unspecified: Secondary | ICD-10-CM

## 2018-08-11 DIAGNOSIS — I2510 Atherosclerotic heart disease of native coronary artery without angina pectoris: Secondary | ICD-10-CM

## 2018-08-11 DIAGNOSIS — N183 Chronic kidney disease, stage 3 (moderate): Secondary | ICD-10-CM

## 2018-08-11 DIAGNOSIS — I5043 Acute on chronic combined systolic (congestive) and diastolic (congestive) heart failure: Secondary | ICD-10-CM

## 2018-08-11 LAB — BASIC METABOLIC PANEL
ANION GAP: 12 (ref 5–15)
BUN: 41 mg/dL — ABNORMAL HIGH (ref 8–23)
CALCIUM: 8.6 mg/dL — AB (ref 8.9–10.3)
CHLORIDE: 104 mmol/L (ref 98–111)
CO2: 23 mmol/L (ref 22–32)
Creatinine, Ser: 2.98 mg/dL — ABNORMAL HIGH (ref 0.61–1.24)
GFR calc Af Amer: 20 mL/min — ABNORMAL LOW (ref >60)
GFR calc non Af Amer: 17 mL/min — ABNORMAL LOW (ref >60)
GLUCOSE: 96 mg/dL (ref 70–99)
Potassium: 5.5 mmol/L — ABNORMAL HIGH (ref 3.5–5.1)
Sodium: 139 mmol/L (ref 135–145)

## 2018-08-11 MED ORDER — SODIUM POLYSTYRENE SULFONATE 15 GM/60ML PO SUSP
15.0000 g | Freq: Once | ORAL | Status: DC
  Filled 2018-08-11: qty 60

## 2018-08-11 MED ORDER — ORAL CARE MOUTH RINSE
15.0000 mL | Freq: Two times a day (BID) | OROMUCOSAL | Status: DC
  Administered 2018-08-11 – 2018-08-12 (×3): 15 mL via OROMUCOSAL

## 2018-08-11 MED ORDER — SODIUM POLYSTYRENE SULFONATE PO POWD
15.0000 g | Freq: Once | ORAL | Status: AC
  Administered 2018-08-11: 15 g via ORAL
  Filled 2018-08-11: qty 15

## 2018-08-11 MED ORDER — SODIUM CHLORIDE 0.9 % IV SOLN
1.0000 g | INTRAVENOUS | Status: DC
  Administered 2018-08-11: 1 g via INTRAVENOUS
  Filled 2018-08-11 (×2): qty 10

## 2018-08-11 MED ORDER — ISOSORBIDE MONONITRATE ER 30 MG PO TB24
30.0000 mg | ORAL_TABLET | Freq: Every day | ORAL | Status: DC
  Administered 2018-08-11 – 2018-08-12 (×2): 30 mg via ORAL
  Filled 2018-08-11 (×2): qty 1

## 2018-08-11 NOTE — Progress Notes (Signed)
PROGRESS NOTE                                                                                                                                                                                                             Patient Demographics:    John Peck, is a 84 y.o. male, DOB - 05-06-1924, OVZ:[id number]  Admit date - 08/09/2018   Admitting Physician Janora Norlander, MD  Outpatient Primary MD for the patient is Seward Carol, MD  LOS - 2  Outpatient Specialists:  Chief Complaint  Patient presents with  . Altered Mental Status    Not acting himself       Brief Narrative 84 year old male with history of mild dementia, CAD status post CABG, A. fib, sick sinus syndrome status post pacemaker, hypertension, chronic kidney disease stage III, hypothyroidism recently discharged from the hospital with CHF exacerbation brought to the ED from skilled nursing facility due to altered mental status for the past 2 days.  Patient apparently was talking to people who were not there and also having poor p.o. intake.  No fevers, fall or head injury.  During his recent hospitalization he was treated for CHF exacerbation developed acute kidney injury which improved after Lasix was held. In the ED vitals were stable except for elevated systolic blood pressure.  Head CT unremarkable.  Chest x-ray showed interval worsening of his known right pleural effusion.  UA suspicious for UTI   Subjective:   -Remains pleasantly confused, no significant agitation overnight   Assessment  & Plan :    Acute metabolic encephalopathy/acute delirium -Primary trigger is unclear -No fevers or leukocytosis, UA was grossly abnormal, urine cultures were not sent on admission, add IV ceftriaxone -No significant agitation last night -Mentation likely better this morning  Acute on chronic combined systolic and diastolic CHF -Appears close to euvolemic now,  significant bump in creatinine to 2.9 with hyperkalemia -Hold IV Lasix today  Acute kidney injury superimposed on chronic kidney disease stage III Baseline creatinine around 1.3-  1.5 which had worsened during recent hospitalization -Creatinine was 2.4 yesterday today is 2.9 -Hold IV Lasix -Cut down Imdur dose, monitor  Hyperkalemia -Due to acute kidney injury, hold Lasix, give Kayexalate x1  Essential hypertension Continue Coreg, decrease dose of Imdur  Coronary artery disease Asymptomatic.  Continue beta-blocker, Imdur, aspirin, Plavix and Ranexa.  Chronic bilateral pleural effusion R >L In the past and have been transudative on study -Suspect related to CHF -Remains asymptomatic from this, would not push aggressive work-up especially in light of lack of symptoms, advanced age and dementia  Paroxysmal A. fib Not on anticoagulation due to GI bleed.  Continue aspirin and beta-blocker  Chronic COPD Not on exacerbation.  Continue PRN nebs  Hyperlipidemia Continue statin  Hypothyroidism Continue Synthroid  DVT prophylaxis: Heparin subcutaneous  Code status: DNR Family communication: No family at bedside Disposition SNF in 1 to 2 days when symptoms improving, labs better     Lab Results  Component Value Date   PLT 231 08/10/2018    Antibiotics  :    Anti-infectives (From admission, onward)   None        Objective:   Vitals:   08/10/18 2057 08/10/18 2122 08/11/18 0554 08/11/18 0820  BP:  138/75 132/71   Pulse:  (!) 59 61   Resp:  18 17   Temp:  98.1 F (36.7 C) 98 F (36.7 C)   TempSrc:  Oral Oral   SpO2: 96% 98% 99% 98%  Weight:      Height:        Wt Readings from Last 3 Encounters:  08/09/18 63 kg  08/02/18 62.2 kg  06/29/18 65.3 kg     Intake/Output Summary (Last 24 hours) at 08/11/2018 1316 Last data filed at 08/11/2018 1235 Gross per 24 hour  Intake 560 ml  Output 775 ml  Net -215 ml     Physical Exam  Gen: Awake, Alert,  Oriented X self and partly to place only HEENT: PERRLA, Neck supple, no JVD Lungs: Clear bilaterally CVS: S1-S2/regular rate rhythm Abd: soft, Non tender, non distended, BS present Extremities: No edema Skin: no new rashes   Data Review:    CBC Recent Labs  Lab 08/09/18 1007 08/10/18 0509  WBC 4.7 4.5  HGB 11.0* 10.9*  HCT 33.6* 32.9*  PLT 214 231  MCV 97.4 96.8  MCH 31.9 32.1  MCHC 32.7 33.1  RDW 14.1 14.0    Chemistries  Recent Labs  Lab 08/09/18 1007 08/10/18 0509 08/11/18 0419  NA 138 137 139  K 4.4 4.4 5.5*  CL 101 102 104  CO2 26 22 23   GLUCOSE 94 76 96  BUN 30* 30* 41*  CREATININE 2.55* 2.34* 2.98*  CALCIUM 9.0 8.7* 8.6*  AST 18  --   --   ALT 8  --   --   ALKPHOS 59  --   --   BILITOT 1.3*  --   --    ------------------------------------------------------------------------------------------------------------------ No results for input(s): CHOL, HDL, LDLCALC, TRIG, CHOLHDL, LDLDIRECT in the last 72 hours.  Lab Results  Component Value Date   HGBA1C 5.5 02/23/2018   ------------------------------------------------------------------------------------------------------------------ Recent Labs    08/09/18 1629  TSH 3.389   ------------------------------------------------------------------------------------------------------------------ No results for input(s): VITAMINB12, FOLATE, FERRITIN, TIBC, IRON, RETICCTPCT in the last 72 hours.  Coagulation profile Recent Labs  Lab 08/09/18 1007  INR 1.24    No results for input(s): DDIMER in the last 72 hours.  Cardiac Enzymes No results for input(s): CKMB, TROPONINI, MYOGLOBIN in the last 168 hours.  Invalid input(s): CK ------------------------------------------------------------------------------------------------------------------    Component Value Date/Time   BNP 1,084.8 (H) 08/09/2018 1629    Inpatient Medications  Scheduled Meds: . acetaminophen  650 mg Oral TID  . aspirin  81 mg  Oral Daily  . atorvastatin  40 mg Oral QPM  .  carvedilol  12.5 mg Oral BID WC  . clopidogrel  75 mg Oral Daily  . ezetimibe  10 mg Oral Daily  . ferrous sulfate  325 mg Oral BID WC  . heparin  5,000 Units Subcutaneous Q8H  . ipratropium-albuterol  3 mL Nebulization BID  . isosorbide mononitrate  30 mg Oral Daily  . latanoprost  1 drop Both Eyes Once per day on Mon Wed  . levothyroxine  125 mcg Oral QAC breakfast  . mouth rinse  15 mL Mouth Rinse BID  . pantoprazole  40 mg Oral Daily  . polyethylene glycol  17 g Oral Daily  . ranolazine  500 mg Oral BID  . sodium chloride flush  3 mL Intravenous Q12H  . sodium polystyrene  15 g Oral Once  . tamsulosin  0.4 mg Oral Daily   Continuous Infusions: . sodium chloride     PRN Meds:.sodium chloride, albuterol, ipratropium-albuterol, nitroGLYCERIN, RESOURCE THICKENUP CLEAR, senna, sodium chloride flush  Micro Results Recent Results (from the past 240 hour(s))  Gram stain     Status: None   Collection Time: 08/01/18  2:35 PM  Result Value Ref Range Status   Specimen Description PLEURAL RIGHT  Final   Special Requests NONE  Final   Gram Stain   Final    FEW WBC PRESENT,BOTH PMN AND MONONUCLEAR NO ORGANISMS SEEN Performed at [facility], [address], [place], [place] [address]    Report Status 08/01/2018 FINAL  Final  Culture, body fluid-bottle     Status: None   Collection Time: 08/01/18  2:35 PM  Result Value Ref Range Status   Specimen Description PLEURAL RIGHT  Final   Special Requests NONE  Final   Culture   Final    NO GROWTH 5 DAYS Performed at [facility] [address], [place], [place] [address]    Report Status 08/06/2018 FINAL  Final  MRSA PCR Screening     Status: None   Collection Time: 08/09/18  8:33 PM  Result Value Ref Range Status   MRSA by PCR NEGATIVE NEGATIVE Final    Comment:        The GeneXpert MRSA Assay (FDA approved for NASAL specimens only), is one component of a comprehensive  MRSA colonization surveillance program. It is not intended to diagnose MRSA infection nor to guide or monitor treatment for MRSA infections. Performed at [facility], [place] [address] [place], [place] [address]     Radiology Reports Ct Abdomen Pelvis Wo Contrast  Result Date: 08/01/2018 CLINICAL DATA:  84 year old male with right flank pain. EXAM: CT ABDOMEN AND PELVIS WITHOUT CONTRAST TECHNIQUE: Multidetector CT imaging of the abdomen and pelvis was performed following the standard protocol without IV contrast. COMPARISON:  CT of the abdomen pelvis dated 08/10/2006 FINDINGS: Evaluation of this exam is limited in the absence of intravenous contrast. Lower chest: Partially visualized moderate to large right pleural effusion with compressive atelectasis of the majority of the visualized right lower lobe and partial compressive atelectasis of the right middle lobe. There is a trace left pleural effusion. Diffuse interstitial and interlobular septal prominence likely edema. There is borderline cardiomegaly and coronary vascular calcification. An AICD wire is partially seen. No intra-abdominal free air or free fluid. Hepatobiliary: The liver is unremarkable. There is a stone in the gallbladder neck. No pericholecystic fluid. Ultrasound may provide better evaluation of the gallbladder. Pancreas: Unremarkable. No pancreatic ductal dilatation or surrounding inflammatory changes. Spleen: Normal in size  without focal abnormality. Adrenals/Urinary Tract: The adrenal glands are unremarkable. Indeterminate bilobed or adjacent hypodense lesion in the inferior pole of the right kidney measuring approximately 3.7 x 2.3 cm. Further characterization with ultrasound or MRI on a nonemergent basis recommended. The left kidney is unremarkable. There is no hydronephrosis or nephrolithiasis on either side. The urinary bladder is grossly unremarkable for the degree of distention. Stomach/Bowel: There is sigmoid  diverticulosis without active inflammatory changes. Large amount of stool noted in the ascending and transverse colon. No bowel obstruction or active inflammation. The appendix is not visualized with certainty. No inflammatory changes identified in the right lower quadrant. Vascular/Lymphatic: There is advanced aortoiliac atherosclerotic disease. There is a fusiform infrarenal abdominal aortic aneurysm measuring 3.5 cm in diameter. Evaluation of the vasculature is limited on this noncontrast CT. No portal venous gas. There is no adenopathy. Reproductive: The prostate and seminal vesicles are grossly unremarkable. Other: None Musculoskeletal: Osteopenia with degenerative changes of the spine. No acute osseous pathology. IMPRESSION: 1. Stone within the gallbladder neck. Correlation with ultrasound recommended. 2. No hydronephrosis or nephrolithiasis. Right renal inferior pole hypodense lesions, indeterminate. Further evaluation with MRI on a nonemergent basis recommended. 3. Sigmoid diverticulosis. Moderate colonic stool burden. No bowel obstruction. 4. Large right pleural effusion with associated compressive atelectasis of the right lung base. Electronically Signed   By: Anner Crete M.D.   On: 08/01/2018 01:44   Dg Chest 1 View  Result Date: 08/01/2018 CLINICAL DATA:  Post  thora  Rt side EXAM: CHEST  1 VIEW COMPARISON:  07/31/2018 FINDINGS: Status post median sternotomy. Patient has a LEFT-sided transvenous pacemaker with leads to the RIGHT atrium and RIGHT ventricle. The heart is mildly enlarged. The RIGHT sided pleural effusion slightly smaller with no evidence for postprocedure pneumothorax. LEFT lung remains clear. IMPRESSION: Smaller RIGHT pleural effusion.  No pneumothorax. Electronically Signed   By: Nolon Nations M.D.   On: 08/01/2018 14:45   Dg Chest 2 View  Result Date: 07/29/2018 CLINICAL DATA:  Shortness of breath and chest pain EXAM: CHEST - 2 VIEW COMPARISON:  July 02, 2018 FINDINGS:  There is a pleural effusion on the right with atelectatic change in the right mid and lower lung zones. There is no frank consolidation. Heart is upper normal in size with pulmonary vascularity normal. Pacemaker leads are attached to the right atrium and right ventricle. Patient is status post internal mammary bypass grafting. No adenopathy. No bone lesions. IMPRESSION: Moderate right pleural effusion with right mid and lower lung zone atelectatic change. No frank consolidation. Stable cardiac silhouette. Pacemaker leads attached to right atrium and right ventricle. No pneumothorax. Electronically Signed   By: Lowella Grip III M.D.   On: 07/29/2018 12:05   Dg Ribs Bilateral W/chest  Result Date: 07/31/2018 CLINICAL DATA:  Right rib pain EXAM: BILATERAL RIBS AND CHEST - 4+ VIEW COMPARISON:  Chest radiographs dated 07/29/2018 FINDINGS: Moderate right pleural effusion. Trace left pleural effusion. No frank interstitial edema. No pneumothorax. Cardiomegaly.  Left subclavian pacemaker. No displaced rib fracture is seen. IMPRESSION: Moderate right pleural effusion.  Trace left pleural effusion. No displaced rib fracture is seen. Electronically Signed   By: Julian Hy M.D.   On: 07/31/2018 09:32   Ct Head Wo Contrast  Result Date: 08/09/2018 CLINICAL DATA:  Altered level of consciousness. Confusion. Nonverbal. EXAM: CT HEAD WITHOUT CONTRAST TECHNIQUE: Contiguous axial images were obtained from the base of the skull through the vertex without intravenous contrast. COMPARISON:  06/29/2018 FINDINGS: Brain: Within  limitations of artifact created by the patient's right hand being placed on the head, there is no evidence of acute infarct, intracranial hemorrhage, mass, midline shift, or extra-axial fluid collection. Mild cerebral atrophy is within normal limits for age. Periventricular white matter hypodensities were more conspicuous on the prior study, nonspecific but likely reflecting mild chronic small  vessel ischemic disease. Vascular: Calcified atherosclerosis at the skull base. No hyperdense vessel. Skull: No fracture or focal osseous lesion. Sinuses/Orbits: Mild right ethmoid air cell mucosal thickening. Visualized mastoid air cells are clear. Prior right cataract extraction. Other: Partially visualized 1.7 cm low-density subcutaneous nodule posterior to the right parotid gland, grossly unchanged in size from a 02/23/2018 neck CT and likely reflecting a sebaceous cyst or similar. IMPRESSION: No evidence of acute intracranial abnormality. Electronically Signed   By: Logan Bores M.D.   On: 08/09/2018 12:20   Dg Chest Port 1 View  Result Date: 08/09/2018 CLINICAL DATA:  Altered awareness. EXAM: PORTABLE CHEST 1 VIEW COMPARISON:  08/01/2018. FINDINGS: Cardiac pacer with lead tips in right atrium right ventricle. Prior CABG. Cardiomegaly with mild bilateral interstitial prominence and prominent right pleural effusion. Tiny left pleural effusion cannot be excluded. Findings suggest CHF. Findings have worsened from prior exam. Pneumonitis cannot be excluded. No pneumothorax. IMPRESSION: 1. Cardiac pacer with lead tips over the right atrium right ventricle. Prior CABG. Cardiomegaly with diffuse mild bilateral interstitial prominence and prominent right pleural effusion. Small left pleural effusion. Findings suggest CHF. Findings have progressed from prior exam. Electronically Signed   By: Marcello Moores  Register   On: 08/09/2018 10:59   Ir Thoracentesis Asp Pleural Space W/img Guide  Result Date: 08/01/2018 INDICATION: History of COPD, recurrent right sided pleural effusion with dyspnea requiring continuous oxygen via [place]. Request for diagnostic and therapeutic thoracentesis today. EXAM: ULTRASOUND GUIDED RIGHT THORACENTESIS MEDICATIONS: 10 mL 1% lidocaine. COMPLICATIONS: None immediate. PROCEDURE: An ultrasound guided thoracentesis was thoroughly discussed with the patient and questions answered. The benefits,  risks, alternatives and complications were also discussed. The patient understands and wishes to proceed with the procedure. Written consent was obtained. Ultrasound was performed to localize and mark an adequate pocket of fluid in the right chest. The area was then prepped and draped in the normal sterile fashion. 1% Lidocaine was used for local anesthesia. Under ultrasound guidance a 6 Fr Safe-T-Centesis catheter was introduced. Thoracentesis was performed. The catheter was removed and a dressing applied. FINDINGS: A total of approximately 1.1L of amber fluid was removed. Samples were sent to the laboratory as requested by the clinical team. IMPRESSION: Successful ultrasound guided right thoracentesis yielding 1.1 L of pleural fluid. Read by Candiss Norse, PA-C Electronically Signed   By: Jacqulynn Cadet M.D.   On: 08/01/2018 14:53    Time Spent in minutes : 30  Domenic Polite M.D on 08/11/2018 at 1:16 PM  Page via [email or web address]  After 7pm go to [email or web address] - password [facility]  [facility] -  Office  [phone]

## 2018-08-11 NOTE — Evaluation (Signed)
Physical Therapy Evaluation and discharge Patient Details Name: John Peck MRN: [id number] DOB: [birth year] Today's Date: 08/11/2018   History of Present Illness  Pt is a 84 y/o male admitted secodnary to [facility]. Likely secondary to UTI. CT negative for acute abnormality. PMH includes dementia, HTN, a fib, CAD s/p CABG, CHF, CKD, and COPD.   Clinical Impression  Pt admitted secondary to problem above with deficits below. Required mod to max A +2 to transfer to chair. Pt's family present and reports pt has not ambulated in a while and mainly uses WC at SNF. Per family pt from SNF, so feel further needs can be met at next venue of care. Will sign off. If needs change, please re-consult.     Follow Up Recommendations SNF;Supervision/Assistance - 24 hour    Equipment Recommendations  None recommended by PT    Recommendations for Other Services       Precautions / Restrictions Precautions Precautions: Fall Restrictions Weight Bearing Restrictions: No      Mobility  Bed Mobility Overal bed mobility: Needs Assistance Bed Mobility: Supine to Sit     Supine to sit: Max assist     General bed mobility comments: Max A for LE assist and trunk elevation. Poor sitting balance and required min to mod A to maintain sitting balance.   Transfers Overall transfer level: Needs assistance Equipment used: 2 person hand held assist Transfers: Sit to/from [facility] Sit to Stand: +2 physical assistance;Mod assist;Max assist Stand pivot transfers: Mod assist;Max assist;+2 physical assistance       General transfer comment: Required mod to max A +2 to stand and transfer to chair. Verbal cues for sequencing. Pt's family reports pt is close to baseline with mobility.   Ambulation/Gait             General Gait Details: Per pt's family, pt has not been able to ambulate for awhile.   Stairs            Wheelchair Mobility    Modified Rankin (Stroke Patients  Only)       Balance Overall balance assessment: Needs assistance Sitting-balance support: Feet supported;Bilateral upper extremity supported Sitting balance-Leahy Scale: Poor Sitting balance - Comments: Reliant on min to mod A for sitting balance.    Standing balance support: Bilateral upper extremity supported;During functional activity Standing balance-Leahy Scale: Poor Standing balance comment: Reliant on BUE and external support                              Pertinent Vitals/Pain Pain Assessment: Faces Faces Pain Scale: Hurts little more Pain Location: bilateral feet  Pain Descriptors / Indicators: Aching;Grimacing Pain Intervention(s): Limited activity within patient's tolerance;Monitored during session;Repositioned    Home Living Family/patient expects to be discharged to:: Skilled nursing facility                      Prior Function Level of Independence: Needs assistance   Gait / Transfers Assistance Needed: Family reports he has been using a WC and staff has to help assist to [facility]. Wife reports he has been unable to ambulate for awhile.   ADL's / Homemaking Assistance Needed: Pt's family reports the staff at [facility] assist with all ADLs.         Hand Dominance   Dominant Hand: Right    Extremity/Trunk Assessment   Upper Extremity Assessment Upper Extremity Assessment: Generalized weakness    Lower  Extremity Assessment Lower Extremity Assessment: Generalized weakness    Cervical / Trunk Assessment Cervical / Trunk Assessment: Kyphotic  Communication   Communication: HOH  Cognition Arousal/Alertness: Awake/alert Behavior During Therapy: WFL for tasks assessed/performed Overall Cognitive Status: History of cognitive impairments - at baseline                                 General Comments: Dementia at baseline. able to recognize family and was oriented X3. Disoriented to situation.       General Comments General comments  (skin integrity, edema, etc.): Pt's grandaughter and wife present during session.     Exercises     Assessment/Plan    PT Assessment All further PT needs can be met in the next venue of care  PT Problem List Decreased strength;Decreased mobility;Decreased safety awareness;Decreased activity tolerance;Decreased balance;Decreased knowledge of use of DME;Decreased cognition       PT Treatment Interventions      PT Goals (Current goals can be found in the Care Plan section)  Acute Rehab PT Goals Patient Stated Goal: to go back to SNF per family  PT Goal Formulation: With patient Time For Goal Achievement: 08/11/18 Potential to Achieve Goals: Fair    Frequency     Barriers to discharge        Co-evaluation               AM-PAC PT "6 Clicks" Daily Activity  Outcome Measure Difficulty turning over in bed (including adjusting bedclothes, sheets and blankets)?: Unable Difficulty moving from lying on back to sitting on the side of the bed? : Unable Difficulty sitting down on and standing up from a chair with arms (e.g., wheelchair, bedside commode, etc,.)?: Unable Help needed moving to and from a bed to chair (including a wheelchair)?: A Lot Help needed walking in hospital room?: Total Help needed climbing 3-5 steps with a railing? : Total 6 Click Score: 7    End of Session Equipment Utilized During Treatment: Gait belt Activity Tolerance: Patient tolerated treatment well Patient left: in chair;with call bell/phone within reach;with nursing/sitter in room;with family/visitor present;with chair alarm set Nurse Communication: Mobility status PT Visit Diagnosis: Other abnormalities of gait and mobility (R26.89);Muscle weakness (generalized) (M62.81);Unsteadiness on feet (R26.81)    Time: 6962-9528 PT Time Calculation (min) (ACUTE ONLY): 17 min   Charges:   PT Evaluation $PT Eval Moderate Complexity: [place], PT, DPT  Acute Rehabilitation  Services  Pager: [phone] Office: [phone]   Rudean Hitt 08/11/2018, 12:15 PM

## 2018-08-12 DIAGNOSIS — R319 Hematuria, unspecified: Secondary | ICD-10-CM | POA: Diagnosis not present

## 2018-08-12 DIAGNOSIS — I5043 Acute on chronic combined systolic (congestive) and diastolic (congestive) heart failure: Secondary | ICD-10-CM | POA: Diagnosis not present

## 2018-08-12 DIAGNOSIS — R293 Abnormal posture: Secondary | ICD-10-CM | POA: Diagnosis not present

## 2018-08-12 DIAGNOSIS — N179 Acute kidney failure, unspecified: Secondary | ICD-10-CM

## 2018-08-12 DIAGNOSIS — R278 Other lack of coordination: Secondary | ICD-10-CM | POA: Diagnosis not present

## 2018-08-12 DIAGNOSIS — R41841 Cognitive communication deficit: Secondary | ICD-10-CM | POA: Diagnosis not present

## 2018-08-12 DIAGNOSIS — M6281 Muscle weakness (generalized): Secondary | ICD-10-CM | POA: Diagnosis not present

## 2018-08-12 DIAGNOSIS — R1312 Dysphagia, oropharyngeal phase: Secondary | ICD-10-CM | POA: Diagnosis not present

## 2018-08-12 DIAGNOSIS — J90 Pleural effusion, not elsewhere classified: Secondary | ICD-10-CM | POA: Diagnosis not present

## 2018-08-12 DIAGNOSIS — R2689 Other abnormalities of gait and mobility: Secondary | ICD-10-CM | POA: Diagnosis not present

## 2018-08-12 DIAGNOSIS — R2681 Unsteadiness on feet: Secondary | ICD-10-CM | POA: Diagnosis not present

## 2018-08-12 LAB — CBC
HCT: 33.5 % — ABNORMAL LOW (ref 39.0–52.0)
Hemoglobin: 11.2 g/dL — ABNORMAL LOW (ref 13.0–17.0)
MCH: 32.8 pg (ref 26.0–34.0)
MCHC: 33.4 g/dL (ref 30.0–36.0)
MCV: 98.2 fL (ref 78.0–100.0)
Platelets: 237 10*3/uL (ref 150–400)
RBC: 3.41 MIL/uL — AB (ref 4.22–5.81)
RDW: 14.3 % (ref 11.5–15.5)
WBC: 4.5 10*3/uL (ref 4.0–10.5)

## 2018-08-12 LAB — BASIC METABOLIC PANEL
Anion gap: 15 (ref 5–15)
BUN: 34 mg/dL — ABNORMAL HIGH (ref 8–23)
CALCIUM: 8.4 mg/dL — AB (ref 8.9–10.3)
CO2: 22 mmol/L (ref 22–32)
CREATININE: 2.36 mg/dL — AB (ref 0.61–1.24)
Chloride: 103 mmol/L (ref 98–111)
GFR calc non Af Amer: 22 mL/min — ABNORMAL LOW (ref >60)
GFR, EST AFRICAN AMERICAN: 26 mL/min — AB (ref >60)
Glucose, Bld: 101 mg/dL — ABNORMAL HIGH (ref 70–99)
Potassium: 4 mmol/L (ref 3.5–5.1)
Sodium: 140 mmol/L (ref 135–145)

## 2018-08-12 MED ORDER — FUROSEMIDE 20 MG PO TABS: 20.0000 mg | ORAL_TABLET | Freq: Two times a day (BID) | ORAL | Status: DC

## 2018-08-12 NOTE — Clinical Social Work Note (Signed)
CSW facilitated patient discharge including contacting patient family Dorian Pod [place] and Gwenlyn Fudge) and facility to confirm patient discharge plans. Clinical information faxed to facility and family agreeable with plan. CSW arranged ambulance transport via [facility] to [facility] RN to call report prior to discharge [phone] Room 306A).  CSW will sign off for now as social work intervention is no longer needed. Please consult [place] again if new needs arise.  [place]ton Scrape, [place]

## 2018-08-12 NOTE — Clinical Social Work Note (Signed)
Clinical Social Work Assessment  Patient Details  Name: John Peck MRN: [id number] Date of Birth: 12/14/1923  Date of referral:  08/12/18               Reason for consult:  Discharge Planning                Permission sought to share information with:  Facility Sport and exercise psychologist, Family Supports Permission granted to share information::     Name::     [place]::  [place] [facility]  Relationship::  Granddaughter  Contact Information:  [phone]  Housing/Transportation Living arrangements for the past 2 months:  [place] of Information:  Patient, Scientist, water quality, Facility Patient Interpreter Needed:  None Criminal Activity/Legal Involvement Pertinent to Current Situation/Hospitalization:  No - Comment as needed Significant Relationships:  Adult Children, Other Family Members Lives with:  Facility Resident Do you feel safe going back to the place where you live?  Yes Need for family participation in patient care:  Yes (Comment)  Care giving concerns:  Patient is a long-term resident at [facility].   Social Worker assessment / plan:  CSW met with patient. Granddaughter at bedside. CSW introduced role and explained that discharge planning would be discussed. Patient and granddaughter confirmed he is from [facility] and plan is for him to return at discharge. SNF aware plan is for discharge today. No further concerns. CSW encouraged patient and his granddaughter to contact CSW as needed. CSW will continue to follow patient and his granddaughter for support and facilitate discharge back to SNF today.  Employment status:  Retired Nurse, adult PT Recommendations:  [place] / Referral to community resources:  [place]  Patient/Family's Response to care:  Patient and his granddaughter agreeable to return to SNF today. Patient's family supportive and involved in  patient's care. Patient and his granddaughter appreciated social work intervention.  Patient/Family's Understanding of and Emotional Response to Diagnosis, Current Treatment, and Prognosis:  Patient and his granddaughter have a good understanding of the reason for admission and plan to return to SNF today. Patient and his granddaughter appear happy with hospital care.  Emotional Assessment Appearance:  Appears stated age Attitude/Demeanor/Rapport:  Engaged, Gracious Affect (typically observed):  Accepting, Appropriate, Calm, Pleasant Orientation:  Oriented to Self, Oriented to Place Alcohol / Substance use:  Never Used Psych involvement (Current and /or in the community):  No (Comment)  Discharge Needs  Concerns to be addressed:  Care Coordination Readmission within the last 30 days:  Yes Current discharge risk:  None Barriers to Discharge:  No Barriers Identified   Candie Chroman, LCSW 08/12/2018, 1:21 PM

## 2018-08-12 NOTE — Progress Notes (Signed)
Pt for discharge going to [place] trying to give report 3x but unsuccessful, transport is here, discontinued peripheral IV line, pt alert and responsive, no s/s of distress, he ambulate in the hallway, no complain of pain

## 2018-08-12 NOTE — Discharge Summary (Signed)
Physician Discharge Summary  John Peck UKG:[id number] DOB: [birth year] DOA: 08/09/2018  PCP: John Carol, MD  Admit date: 08/09/2018 Discharge date: 08/12/2018  Time spent: 45 minutes  Recommendations for Outpatient Follow-up:  1. PCP in 1 week 2. Palliative Care FU at SNF needed, need transition to Hospice as he declines further   Discharge Diagnoses:  Principal Problem:   Metabolic encephalopathy   Delirium   Dementia   Possible UTI   Chronic R pleural effusion   Chronic kidney disease stage 4   Chronic systolic CHF   Essential hypertension   PACEMAKER, PERMANENT- [employer] Sept 2009   History of iron deficiency anemia   PAF (paroxysmal atrial fibrillation) (HCC)   Hyperlipidemia LDL goal <70   CAD in native artery   Hypertensive heart disease   Acute on chronic combined systolic and diastolic CHF (congestive heart failure) (HCC)   COPD clinical dx/ no pfts on record   CKD (chronic kidney disease), stage III (HCC)   Hypothyroidism   Pleural effusion   Delirium   UTI (urinary tract infection)   AKI (acute kidney injury) ([place])   Discharge Condition: stable  Diet recommendation: Dysphagia 1 diet with nectar thick liquids  Filed Weights   08/09/18 1434  Weight: 63 kg    History of present illness:  84 year old male with history of mild dementia, CAD status post CABG, A. fib, sick sinus syndrome status post pacemaker, hypertension, chronic kidney disease stage III, hypothyroidism recently discharged from the hospital with CHF exacerbation brought to the ED from skilled nursing facility due to altered mental status for the past 2 days.  Patient apparently was talking to people who were not there and also having poor p.o. intake.   Hospital Course:   Acute metabolic encephalopathy/acute delirium -Primary trigger is unclear -No fevers or leukocytosis, UA was grossly abnormal, treated with IV ceftriaxone for 3days, cultures negative -Mentation more stable now,  suspect mild dementia at baseline with some sundowning  Acute on chronic combined systolic and diastolic CHF -chronic R pleural effusion-transudative, suspected to be from CHF -EF is 76% with diastolic dysfunction -had worsening of creatinine with attempts at diuresis -On further review he has had this mild to moderate right-sided pleural effusion for at least 5 months, at this time not causing any symptoms, has been drained multiple times. -Due to significant worsening of creatinine with any attempts at aggressive diuresis and lack of symptoms, decided to pursue conservative management with diuretics as tolerated -Overall prognosis also is poor due to advanced age, dementia, CKD stage IV, dysphagia -Has been seen by palliative care multiple times during prior hospitalizations -Discussed with daughter, recommended conservative management only, comfort focused care and eventually transition to hospice as he declines further  Acute kidney injury superimposed on chronic kidney disease stage 4 -Baseline Creatinine around 2 -Worsened to 2.9 with aggressive diuresis, down to 2.3 today which is close to his baseline -Clinically close to euvolemic, transition to oral diuretics as tolerated by blood pressure -Not a dialysis candidate would need for care when kidney function worsens  Hyperkalemia -Due to acute kidney injury, hold Lasix, give Kayexalate x1 -Resolved  Dysphagia -Dysphagia I diet with nectar thick liquids at baseline  Essential hypertension Continue Coreg, decreased dose of Imdur  Coronary artery disease Asymptomatic.  Continue beta-blocker, Imdur, aspirin, Plavix  -Ranexa stopped due to renal failure  Chronic bilateral pleural effusion R >L In the past and have been transudative on study -Suspect related to CHF -Remains asymptomatic from this,  would not push additional aggressive work-up especially in light of lack of symptoms, advanced age and dementia -Discussed this  with patient's daughter  Paroxysmal A. fib Not on anticoagulation due to GI bleed.  Continue aspirin and beta-blocker  Chronic COPD Not on exacerbation.  Continue PRN nebs  Hyperlipidemia Continue statin  Hypothyroidism Continue Synthroid  DVT prophylaxis: Heparin subcutaneous  Code status: DNR  Discharge Exam: Vitals:   08/11/18 2223 08/12/18 0452  BP: (!) 160/78 (!) 157/76  Pulse: (!) 59 60  Resp: 16 16  Temp:  97.9 F (36.6 C)  SpO2: 97% 99%    General: alert, awake, oriented to self only Cardiovascular: S1S2/RRR Respiratory: decreased at R base  Discharge Instructions   Discharge Instructions    Increase activity slowly   Complete by:  As directed      Allergies as of 08/12/2018   No Known Allergies     Medication List    STOP taking these medications   ranolazine 500 MG 12 hr tablet Commonly known as:  RANEXA     TAKE these medications   acetaminophen 325 MG tablet Commonly known as:  TYLENOL Take 650 mg by mouth 3 (three) times daily.   albuterol 108 (90 Base) MCG/ACT inhaler Commonly known as:  PROVENTIL HFA;VENTOLIN HFA Inhale 2 puffs into the lungs every 4 (four) hours as needed for wheezing or shortness of breath (or coughing).   ALPRAZolam 0.25 MG tablet Commonly known as:  XANAX Take 1 tablet (0.25 mg total) by mouth 2 (two) times daily as needed for anxiety.   aspirin 81 MG tablet Take 81 mg by mouth daily.   atorvastatin 40 MG tablet Commonly known as:  LIPITOR Take 1 tablet (40 mg total) by mouth daily. What changed:  when to take this   carvedilol 12.5 MG tablet Commonly known as:  COREG Take 1 tablet (12.5 mg total) by mouth 2 (two) times daily with a meal.   clopidogrel 75 MG tablet Commonly known as:  PLAVIX Take 1 tablet (75 mg total) by mouth daily.   ezetimibe 10 MG tablet Commonly known as:  ZETIA Take 10 mg by mouth daily.   feeding supplement (ENSURE COMPLETE) Liqd Take 237 mLs by mouth 2 (two) times  daily between meals.   feeding supplement (PRO-STAT SUGAR FREE 64) Liqd Take 30 mLs by mouth 3 (three) times daily with meals.   ferrous sulfate 325 (65 FE) MG tablet Take 1 tablet (325 mg total) by mouth 2 (two) times daily with a meal.   furosemide 20 MG tablet Commonly known as:  LASIX Take 1 tablet (20 mg total) by mouth 2 (two) times daily. What changed:  how much to take   ipratropium-albuterol 0.5-2.5 (3) MG/3ML Soln Commonly known as:  DUONEB Take 3 mLs by nebulization 2 (two) times daily.   isosorbide mononitrate 30 MG 24 hr tablet Commonly known as:  IMDUR Take 3 tablets (90 mg total) by mouth daily.   latanoprost 0.005 % ophthalmic solution Commonly known as:  XALATAN Place 1 drop into both eyes 2 (two) times a week. On mondays and wednesdays   levothyroxine 125 MCG tablet Commonly known as:  SYNTHROID, LEVOTHROID TAKE 1 TABLET BY MOUTH EVERY DAY BEFORE BREAKFAST What changed:  See the new instructions.   nitroGLYCERIN 0.4 MG SL tablet Commonly known as:  NITROSTAT Place 1 tablet (0.4 mg total) under the tongue every 5 (five) minutes as needed for chest pain.   pantoprazole 40 MG tablet Commonly known  as:  PROTONIX Take 1 tablet (40 mg total) by mouth daily.   polyethylene glycol packet Commonly known as:  MIRALAX / GLYCOLAX Take 17 g by mouth daily.   senna 8.6 MG tablet Commonly known as:  SENOKOT Take 2 tablets by mouth 2 (two) times daily as needed for constipation.   tamsulosin 0.4 MG Caps capsule Commonly known as:  FLOMAX Take 1 capsule (0.4 mg total) by mouth daily.      No Known Allergies  Contact information for follow-up providers    John Carol, MD. Schedule an appointment as soon as possible for a visit in 1 week(s).   Specialty:  Internal Medicine Contact information: [address] [place] [address] [phone]        Troy Sine, MD .   Specialty:  Cardiology Contact information: [address] [place] [address] [phone]        Evans Lance, MD .   Specialty:  Cardiology Contact information: [phone] [place] [address] [phone]            Contact information for after-discharge care    Destination    [facility] Preferred SNF .   Service:  Skilled Nursing Contact information: [place] [phone]                   The results of significant diagnostics from this hospitalization (including imaging, microbiology, ancillary and laboratory) are listed below for reference.    Significant Diagnostic Studies: Ct Abdomen Pelvis Wo Contrast  Result Date: 08/01/2018 CLINICAL DATA:  84 year old male with right flank pain. EXAM: CT ABDOMEN AND PELVIS WITHOUT CONTRAST TECHNIQUE: Multidetector CT imaging of the abdomen and pelvis was performed following the standard protocol without IV contrast. COMPARISON:  CT of the abdomen pelvis dated 08/10/2006 FINDINGS: Evaluation of this exam is limited in the absence of intravenous contrast. Lower chest: Partially visualized moderate to large right pleural effusion with compressive atelectasis of the majority of the visualized right lower lobe and partial compressive atelectasis of the right middle lobe. There is a trace left pleural effusion. Diffuse interstitial and interlobular septal prominence likely edema. There is borderline cardiomegaly and coronary vascular calcification. An AICD wire is partially seen. No intra-abdominal free air or free fluid. Hepatobiliary: The liver is unremarkable. There is a stone in the gallbladder neck. No pericholecystic fluid. Ultrasound may provide better evaluation of the gallbladder. Pancreas: Unremarkable. No pancreatic ductal dilatation or surrounding inflammatory changes. Spleen: Normal in size without focal abnormality. Adrenals/Urinary Tract: The adrenal glands are unremarkable. Indeterminate  bilobed or adjacent hypodense lesion in the inferior pole of the right kidney measuring approximately 3.7 x 2.3 cm. Further characterization with ultrasound or MRI on a nonemergent basis recommended. The left kidney is unremarkable. There is no hydronephrosis or nephrolithiasis on either side. The urinary bladder is grossly unremarkable for the degree of distention. Stomach/Bowel: There is sigmoid diverticulosis without active inflammatory changes. Large amount of stool noted in the ascending and transverse colon. No bowel obstruction or active inflammation. The appendix is not visualized with certainty. No inflammatory changes identified in the right lower quadrant. Vascular/Lymphatic: There is advanced aortoiliac atherosclerotic disease. There is a fusiform infrarenal abdominal aortic aneurysm measuring 3.5 cm in diameter. Evaluation of the vasculature is limited on this noncontrast CT. No portal venous gas. There is no adenopathy. Reproductive: The prostate and seminal vesicles are grossly unremarkable. Other: None Musculoskeletal:  Osteopenia with degenerative changes of the spine. No acute osseous pathology. IMPRESSION: 1. Stone within the gallbladder neck. Correlation with ultrasound recommended. 2. No hydronephrosis or nephrolithiasis. Right renal inferior pole hypodense lesions, indeterminate. Further evaluation with MRI on a nonemergent basis recommended. 3. Sigmoid diverticulosis. Moderate colonic stool burden. No bowel obstruction. 4. Large right pleural effusion with associated compressive atelectasis of the right lung base. Electronically Signed   By: Anner Crete M.D.   On: 08/01/2018 01:44   Dg Chest 1 View  Result Date: 08/01/2018 CLINICAL DATA:  Post  thora  Rt side EXAM: CHEST  1 VIEW COMPARISON:  07/31/2018 FINDINGS: Status post median sternotomy. Patient has a LEFT-sided transvenous pacemaker with leads to the RIGHT atrium and RIGHT ventricle. The heart is mildly enlarged. The RIGHT sided  pleural effusion slightly smaller with no evidence for postprocedure pneumothorax. LEFT lung remains clear. IMPRESSION: Smaller RIGHT pleural effusion.  No pneumothorax. Electronically Signed   By: Nolon Nations M.D.   On: 08/01/2018 14:45   Dg Chest 2 View  Result Date: 07/29/2018 CLINICAL DATA:  Shortness of breath and chest pain EXAM: CHEST - 2 VIEW COMPARISON:  July 02, 2018 FINDINGS: There is a pleural effusion on the right with atelectatic change in the right mid and lower lung zones. There is no frank consolidation. Heart is upper normal in size with pulmonary vascularity normal. Pacemaker leads are attached to the right atrium and right ventricle. Patient is status post internal mammary bypass grafting. No adenopathy. No bone lesions. IMPRESSION: Moderate right pleural effusion with right mid and lower lung zone atelectatic change. No frank consolidation. Stable cardiac silhouette. Pacemaker leads attached to right atrium and right ventricle. No pneumothorax. Electronically Signed   By: Lowella Grip III M.D.   On: 07/29/2018 12:05   Dg Ribs Bilateral W/chest  Result Date: 07/31/2018 CLINICAL DATA:  Right rib pain EXAM: BILATERAL RIBS AND CHEST - 4+ VIEW COMPARISON:  Chest radiographs dated 07/29/2018 FINDINGS: Moderate right pleural effusion. Trace left pleural effusion. No frank interstitial edema. No pneumothorax. Cardiomegaly.  Left subclavian pacemaker. No displaced rib fracture is seen. IMPRESSION: Moderate right pleural effusion.  Trace left pleural effusion. No displaced rib fracture is seen. Electronically Signed   By: Julian Hy M.D.   On: 07/31/2018 09:32   Ct Head Wo Contrast  Result Date: 08/09/2018 CLINICAL DATA:  Altered level of consciousness. Confusion. Nonverbal. EXAM: CT HEAD WITHOUT CONTRAST TECHNIQUE: Contiguous axial images were obtained from the base of the skull through the vertex without intravenous contrast. COMPARISON:  06/29/2018 FINDINGS: Brain: Within  limitations of artifact created by the patient's right hand being placed on the head, there is no evidence of acute infarct, intracranial hemorrhage, mass, midline shift, or extra-axial fluid collection. Mild cerebral atrophy is within normal limits for age. Periventricular white matter hypodensities were more conspicuous on the prior study, nonspecific but likely reflecting mild chronic small vessel ischemic disease. Vascular: Calcified atherosclerosis at the skull base. No hyperdense vessel. Skull: No fracture or focal osseous lesion. Sinuses/Orbits: Mild right ethmoid air cell mucosal thickening. Visualized mastoid air cells are clear. Prior right cataract extraction. Other: Partially visualized 1.7 cm low-density subcutaneous nodule posterior to the right parotid gland, grossly unchanged in size from a 02/23/2018 neck CT and likely reflecting a sebaceous cyst or similar. IMPRESSION: No evidence of acute intracranial abnormality. Electronically Signed   By: Logan Bores M.D.   On: 08/09/2018 12:20   Dg Chest Port 1 View  Result Date: 08/09/2018  CLINICAL DATA:  Altered awareness. EXAM: PORTABLE CHEST 1 VIEW COMPARISON:  08/01/2018. FINDINGS: Cardiac pacer with lead tips in right atrium right ventricle. Prior CABG. Cardiomegaly with mild bilateral interstitial prominence and prominent right pleural effusion. Tiny left pleural effusion cannot be excluded. Findings suggest CHF. Findings have worsened from prior exam. Pneumonitis cannot be excluded. No pneumothorax. IMPRESSION: 1. Cardiac pacer with lead tips over the right atrium right ventricle. Prior CABG. Cardiomegaly with diffuse mild bilateral interstitial prominence and prominent right pleural effusion. Small left pleural effusion. Findings suggest CHF. Findings have progressed from prior exam. Electronically Signed   By: Marcello Moores  Register   On: 08/09/2018 10:59   Ir Thoracentesis Asp Pleural Space W/img Guide  Result Date: 08/01/2018 INDICATION: History  of COPD, recurrent right sided pleural effusion with dyspnea requiring continuous oxygen via [place]. Request for diagnostic and therapeutic thoracentesis today. EXAM: ULTRASOUND GUIDED RIGHT THORACENTESIS MEDICATIONS: 10 mL 1% lidocaine. COMPLICATIONS: None immediate. PROCEDURE: An ultrasound guided thoracentesis was thoroughly discussed with the patient and questions answered. The benefits, risks, alternatives and complications were also discussed. The patient understands and wishes to proceed with the procedure. Written consent was obtained. Ultrasound was performed to localize and mark an adequate pocket of fluid in the right chest. The area was then prepped and draped in the normal sterile fashion. 1% Lidocaine was used for local anesthesia. Under ultrasound guidance a 6 Fr Safe-T-Centesis catheter was introduced. Thoracentesis was performed. The catheter was removed and a dressing applied. FINDINGS: A total of approximately 1.1L of amber fluid was removed. Samples were sent to the laboratory as requested by the clinical team. IMPRESSION: Successful ultrasound guided right thoracentesis yielding 1.1 L of pleural fluid. Read by Candiss Norse, PA-C Electronically Signed   By: Jacqulynn Cadet M.D.   On: 08/01/2018 14:53    Microbiology: Recent Results (from the past 240 hour(s))  MRSA PCR Screening     Status: None   Collection Time: 08/09/18  8:33 PM  Result Value Ref Range Status   MRSA by PCR NEGATIVE NEGATIVE Final    Comment:        The GeneXpert MRSA Assay (FDA approved for NASAL specimens only), is one component of a comprehensive MRSA colonization surveillance program. It is not intended to diagnose MRSA infection nor to guide or monitor treatment for MRSA infections. Performed at [facility], [place] [address] [place], [place] [address]      Labs: Basic Metabolic Panel: Recent Labs  Lab 08/09/18 1007 08/10/18 0509 08/11/18 0419 08/12/18 0527  NA 138 137 139 140  K 4.4  4.4 5.5* 4.0  CL 101 102 104 103  CO2 26 22 23 22   GLUCOSE 94 76 96 101*  BUN 30* 30* 41* 34*  CREATININE 2.55* 2.34* 2.98* 2.36*  CALCIUM 9.0 8.7* 8.6* 8.4*   Liver Function Tests: Recent Labs  Lab 08/09/18 1007  AST 18  ALT 8  ALKPHOS 59  BILITOT 1.3*  PROT 7.9  ALBUMIN 3.2*   No results for input(s): LIPASE, AMYLASE in the last 168 hours. Recent Labs  Lab 08/09/18 1629  AMMONIA 19   CBC: Recent Labs  Lab 08/09/18 1007 08/10/18 0509 08/12/18 0527  WBC 4.7 4.5 4.5  HGB 11.0* 10.9* 11.2*  HCT 33.6* 32.9* 33.5*  MCV 97.4 96.8 98.2  PLT 214 231 237   Cardiac Enzymes: No results for input(s): CKTOTAL, CKMB, CKMBINDEX, TROPONINI in the last 168 hours. BNP: BNP (last 3 results) Recent Labs    06/02/18  1628 07/29/18 1119 08/09/18 1629  BNP 627.9* 360.4* 1,084.8*    ProBNP (last 3 results) No results for input(s): PROBNP in the last 8760 hours.  CBG: Recent Labs  Lab 08/09/18 1005  GLUCAP 84       Signed:  Domenic Polite MD.  [facility] 08/12/2018, 12:02 PM

## 2018-08-12 NOTE — Progress Notes (Signed)
Report given to Dr. Teena Dunk in [facility], transport via [place], personal  Belongings given.

## 2018-08-15 DIAGNOSIS — N179 Acute kidney failure, unspecified: Secondary | ICD-10-CM | POA: Diagnosis not present

## 2018-08-15 DIAGNOSIS — J90 Pleural effusion, not elsewhere classified: Secondary | ICD-10-CM | POA: Diagnosis not present

## 2018-08-15 DIAGNOSIS — R2681 Unsteadiness on feet: Secondary | ICD-10-CM | POA: Diagnosis not present

## 2018-08-15 DIAGNOSIS — R300 Dysuria: Secondary | ICD-10-CM | POA: Diagnosis not present

## 2018-08-15 DIAGNOSIS — R319 Hematuria, unspecified: Secondary | ICD-10-CM | POA: Diagnosis not present

## 2018-08-15 DIAGNOSIS — R41841 Cognitive communication deficit: Secondary | ICD-10-CM | POA: Diagnosis not present

## 2018-08-15 DIAGNOSIS — M6281 Muscle weakness (generalized): Secondary | ICD-10-CM | POA: Diagnosis not present

## 2018-08-15 DIAGNOSIS — R4182 Altered mental status, unspecified: Secondary | ICD-10-CM | POA: Diagnosis not present

## 2018-08-15 DIAGNOSIS — R278 Other lack of coordination: Secondary | ICD-10-CM | POA: Diagnosis not present

## 2018-08-15 DIAGNOSIS — R1312 Dysphagia, oropharyngeal phase: Secondary | ICD-10-CM | POA: Diagnosis not present

## 2018-08-15 DIAGNOSIS — R2689 Other abnormalities of gait and mobility: Secondary | ICD-10-CM | POA: Diagnosis not present

## 2018-08-15 DIAGNOSIS — R293 Abnormal posture: Secondary | ICD-10-CM | POA: Diagnosis not present

## 2018-08-15 DIAGNOSIS — I5043 Acute on chronic combined systolic (congestive) and diastolic (congestive) heart failure: Secondary | ICD-10-CM | POA: Diagnosis not present

## 2018-08-15 NOTE — Care Management Important Message (Signed)
Important Message  Patient Details  Name: John Peck MRN: [id number] Date of Birth: 1923-12-21   Medicare Important Message Given:  Yes    Charlottie Peragine Montine Circle 08/15/2018, 9:56 AM

## 2018-08-15 NOTE — Care Management Important Message (Signed)
Important Message  Patient Details  Name: John Peck MRN: [id number] Date of Birth: [birth year]   Medicare Important Message Given:    IM signed on 08/12/2018    [name] 08/15/2018, 9:56 AM

## 2018-08-16 DIAGNOSIS — G4751 Confusional arousals: Secondary | ICD-10-CM | POA: Diagnosis not present

## 2018-08-16 DIAGNOSIS — N390 Urinary tract infection, site not specified: Secondary | ICD-10-CM | POA: Diagnosis not present

## 2018-08-17 DIAGNOSIS — N390 Urinary tract infection, site not specified: Secondary | ICD-10-CM | POA: Diagnosis not present

## 2018-08-17 DIAGNOSIS — D649 Anemia, unspecified: Secondary | ICD-10-CM | POA: Diagnosis not present

## 2018-08-17 DIAGNOSIS — R1310 Dysphagia, unspecified: Secondary | ICD-10-CM

## 2018-08-17 DIAGNOSIS — G4751 Confusional arousals: Secondary | ICD-10-CM | POA: Diagnosis not present

## 2018-08-17 DIAGNOSIS — N189 Chronic kidney disease, unspecified: Secondary | ICD-10-CM | POA: Diagnosis not present

## 2018-08-23 DIAGNOSIS — Z79899 Other long term (current) drug therapy: Secondary | ICD-10-CM | POA: Diagnosis not present

## 2018-08-23 DIAGNOSIS — D508 Other iron deficiency anemias: Secondary | ICD-10-CM | POA: Diagnosis not present

## 2018-08-24 DIAGNOSIS — R300 Dysuria: Secondary | ICD-10-CM | POA: Diagnosis not present

## 2018-08-24 DIAGNOSIS — D649 Anemia, unspecified: Secondary | ICD-10-CM | POA: Diagnosis not present

## 2018-08-24 DIAGNOSIS — N390 Urinary tract infection, site not specified: Secondary | ICD-10-CM | POA: Diagnosis not present

## 2018-08-24 DIAGNOSIS — D631 Anemia in chronic kidney disease: Secondary | ICD-10-CM | POA: Diagnosis not present

## 2018-08-24 DIAGNOSIS — L700 Acne vulgaris: Secondary | ICD-10-CM | POA: Diagnosis not present

## 2018-08-24 DIAGNOSIS — N189 Chronic kidney disease, unspecified: Secondary | ICD-10-CM | POA: Diagnosis not present

## 2018-08-26 DIAGNOSIS — R300 Dysuria: Secondary | ICD-10-CM | POA: Diagnosis not present

## 2018-08-29 DIAGNOSIS — I4891 Unspecified atrial fibrillation: Secondary | ICD-10-CM | POA: Diagnosis not present

## 2018-08-29 DIAGNOSIS — L600 Ingrowing nail: Secondary | ICD-10-CM | POA: Diagnosis not present

## 2018-08-29 DIAGNOSIS — J189 Pneumonia, unspecified organism: Secondary | ICD-10-CM | POA: Diagnosis not present

## 2018-08-29 DIAGNOSIS — N189 Chronic kidney disease, unspecified: Secondary | ICD-10-CM | POA: Diagnosis not present

## 2018-08-30 ENCOUNTER — Emergency Department (HOSPITAL_COMMUNITY): Payer: Medicare HMO

## 2018-08-30 ENCOUNTER — Observation Stay (HOSPITAL_COMMUNITY)
Admission: EM | Admit: 2018-08-30 | Discharge: 2018-09-01 | Disposition: A | Discharge status: Skilled Nursing Facility | Payer: Medicare HMO | Attending: Internal Medicine | Admitting: Internal Medicine

## 2018-08-30 ENCOUNTER — Encounter (HOSPITAL_COMMUNITY): Payer: Self-pay | Admitting: Emergency Medicine

## 2018-08-30 DIAGNOSIS — I130 Hypertensive heart and chronic kidney disease with heart failure and stage 1 through stage 4 chronic kidney disease, or unspecified chronic kidney disease: Secondary | ICD-10-CM | POA: Diagnosis not present

## 2018-08-30 DIAGNOSIS — M1990 Unspecified osteoarthritis, unspecified site: Secondary | ICD-10-CM | POA: Diagnosis not present

## 2018-08-30 DIAGNOSIS — Z8249 Family history of ischemic heart disease and other diseases of the circulatory system: Secondary | ICD-10-CM | POA: Insufficient documentation

## 2018-08-30 DIAGNOSIS — Z66 Do not resuscitate: Secondary | ICD-10-CM | POA: Diagnosis not present

## 2018-08-30 DIAGNOSIS — R0789 Other chest pain: Secondary | ICD-10-CM | POA: Diagnosis not present

## 2018-08-30 DIAGNOSIS — J449 Chronic obstructive pulmonary disease, unspecified: Secondary | ICD-10-CM | POA: Diagnosis present

## 2018-08-30 DIAGNOSIS — I480 Paroxysmal atrial fibrillation: Secondary | ICD-10-CM | POA: Diagnosis present

## 2018-08-30 DIAGNOSIS — R1310 Dysphagia, unspecified: Secondary | ICD-10-CM | POA: Diagnosis not present

## 2018-08-30 DIAGNOSIS — Z955 Presence of coronary angioplasty implant and graft: Secondary | ICD-10-CM | POA: Insufficient documentation

## 2018-08-30 DIAGNOSIS — Z7902 Long term (current) use of antithrombotics/antiplatelets: Secondary | ICD-10-CM | POA: Insufficient documentation

## 2018-08-30 DIAGNOSIS — Z950 Presence of cardiac pacemaker: Secondary | ICD-10-CM | POA: Diagnosis not present

## 2018-08-30 DIAGNOSIS — I495 Sick sinus syndrome: Secondary | ICD-10-CM | POA: Diagnosis present

## 2018-08-30 DIAGNOSIS — I2510 Atherosclerotic heart disease of native coronary artery without angina pectoris: Secondary | ICD-10-CM | POA: Diagnosis not present

## 2018-08-30 DIAGNOSIS — E785 Hyperlipidemia, unspecified: Secondary | ICD-10-CM | POA: Diagnosis not present

## 2018-08-30 DIAGNOSIS — D631 Anemia in chronic kidney disease: Secondary | ICD-10-CM | POA: Diagnosis not present

## 2018-08-30 DIAGNOSIS — N183 Chronic kidney disease, stage 3 unspecified: Secondary | ICD-10-CM | POA: Diagnosis present

## 2018-08-30 DIAGNOSIS — I959 Hypotension, unspecified: Secondary | ICD-10-CM | POA: Diagnosis not present

## 2018-08-30 DIAGNOSIS — I447 Left bundle-branch block, unspecified: Secondary | ICD-10-CM | POA: Insufficient documentation

## 2018-08-30 DIAGNOSIS — Z7982 Long term (current) use of aspirin: Secondary | ICD-10-CM | POA: Diagnosis not present

## 2018-08-30 DIAGNOSIS — E039 Hypothyroidism, unspecified: Secondary | ICD-10-CM | POA: Diagnosis not present

## 2018-08-30 DIAGNOSIS — R05 Cough: Secondary | ICD-10-CM | POA: Diagnosis not present

## 2018-08-30 DIAGNOSIS — Z515 Encounter for palliative care: Secondary | ICD-10-CM | POA: Diagnosis not present

## 2018-08-30 DIAGNOSIS — I5022 Chronic systolic (congestive) heart failure: Secondary | ICD-10-CM | POA: Diagnosis present

## 2018-08-30 DIAGNOSIS — Z951 Presence of aortocoronary bypass graft: Secondary | ICD-10-CM | POA: Insufficient documentation

## 2018-08-30 DIAGNOSIS — D539 Nutritional anemia, unspecified: Secondary | ICD-10-CM | POA: Diagnosis not present

## 2018-08-30 DIAGNOSIS — I5042 Chronic combined systolic (congestive) and diastolic (congestive) heart failure: Secondary | ICD-10-CM | POA: Diagnosis not present

## 2018-08-30 DIAGNOSIS — Z87891 Personal history of nicotine dependence: Secondary | ICD-10-CM | POA: Diagnosis not present

## 2018-08-30 DIAGNOSIS — R079 Chest pain, unspecified: Secondary | ICD-10-CM | POA: Diagnosis present

## 2018-08-30 DIAGNOSIS — Z8673 Personal history of transient ischemic attack (TIA), and cerebral infarction without residual deficits: Secondary | ICD-10-CM | POA: Diagnosis not present

## 2018-08-30 DIAGNOSIS — Z79899 Other long term (current) drug therapy: Secondary | ICD-10-CM | POA: Diagnosis not present

## 2018-08-30 DIAGNOSIS — R0602 Shortness of breath: Secondary | ICD-10-CM | POA: Diagnosis not present

## 2018-08-30 LAB — BASIC METABOLIC PANEL
Anion gap: 9 (ref 5–15)
BUN: 41 mg/dL — AB (ref 8–23)
CHLORIDE: 108 mmol/L (ref 98–111)
CO2: 23 mmol/L (ref 22–32)
CREATININE: 1.82 mg/dL — AB (ref 0.61–1.24)
Calcium: 8.5 mg/dL — ABNORMAL LOW (ref 8.9–10.3)
GFR calc non Af Amer: 31 mL/min — ABNORMAL LOW (ref >60)
GFR, EST AFRICAN AMERICAN: 35 mL/min — AB (ref >60)
Glucose, Bld: 102 mg/dL — ABNORMAL HIGH (ref 70–99)
Potassium: 3.9 mmol/L (ref 3.5–5.1)
Sodium: 140 mmol/L (ref 135–145)

## 2018-08-30 LAB — CBC
HCT: 28.4 % — ABNORMAL LOW (ref 39.0–52.0)
Hemoglobin: 8.8 g/dL — ABNORMAL LOW (ref 13.0–17.0)
MCH: 31.3 pg (ref 26.0–34.0)
MCHC: 31 g/dL (ref 30.0–36.0)
MCV: 101.1 fL — ABNORMAL HIGH (ref 80.0–100.0)
Platelets: 154 10*3/uL (ref 150–400)
RBC: 2.81 MIL/uL — ABNORMAL LOW (ref 4.22–5.81)
RDW: 14.3 % (ref 11.5–15.5)
WBC: 4.7 10*3/uL (ref 4.0–10.5)
nRBC: 0 % (ref 0.0–0.2)

## 2018-08-30 LAB — I-STAT TROPONIN, ED: Troponin i, poc: 0.05 ng/mL (ref 0.00–0.08)

## 2018-08-30 NOTE — ED Provider Notes (Signed)
TIME SEEN: 11:58 PM  CHIEF COMPLAINT: Chest pain  HPI: John Peck is a 84 year old male with history of CAD status post CABG, chronic kidney disease, CHF, paroxysmal atrial fibrillation on anticoagulation, pacemaker who presents to the emergency department chest pain.  Chest pain started tonight and described as a nagging, sore pain but did radiate into the left arm.  He did have some mild worsening of his chronic shortness of breath.  No nausea, vomiting, diaphoresis or dizziness.  Pain resolved after 3 nitroglycerin and aspirin.  States feels similar to his anginal equivalent.  Last catheterization was in April 2018.  PCP is Dr. Delfina Redwood.  Daughter also requesting that we check a urinalysis as John Peck has had burning with urination for the past several days.  No penile discharge.  No fever.  No vomiting.  Cath April 2018:   Ost LM to LM lesion, 20 %stenosed.  2nd Mrg lesion, 40 %stenosed.  Mid LAD lesion, 100 %stenosed.  LIMA.  Origin lesion, 100 %stenosed.  Acute Mrg lesion, 90 %stenosed.  Prox RCA-2 lesion, 10 %stenosed.  Dist RCA lesion, 0 %stenosed.  A drug eluting .  Prox RCA-1 lesion, 40 %stenosed.  Ost LAD lesion, 50 %stenosed.  Mid Cx lesion, 30 %stenosed.  Ost 1st Diag to 1st Diag lesion, 30 %stenosed.  There is mild left ventricular systolic dysfunction.  LV end diastolic pressure is mildly elevated.  The left ventricular ejection fraction is 45-50% by visual estimate.  A STENT RESOLUTE ONYX 2.5X18 drug eluting stent was successfully placed.  Prox Cx lesion, 80 %stenosed.  Post intervention, there is a 0% residual stenosis.   1. Significant native three-vessel coronary artery disease with occluded mid LAD. Patent LIMA to LAD and patent stents in the right coronary artery. Progression of proximal left circumflex disease with known occluded SVG graft to OM. 2. Mildly reduced LV systolic function and mildly elevated left ventricular end-diastolic pressure. 3.  Successful angioplasty and drug-eluting stent placement to the proximal left circumflex extending all the way back to the ostium.  ROS: See HPI Constitutional: no fever  Eyes: no drainage  ENT: no runny nose   Cardiovascular:  chest pain  Resp:  SOB  GI: no vomiting GU:  dysuria Integumentary: no rash  Allergy: no hives  Musculoskeletal: no leg swelling  Neurological: no slurred speech ROS otherwise negative  PAST MEDICAL HISTORY/PAST SURGICAL HISTORY:  Past Medical History:  Diagnosis Date  . Anemia   . Arthritis   . CAD (coronary artery disease)    a. CABG '89; b. PCI '04; c. 11/2015 Cath/PCI: LM 20ost, LAD 192m, LCX 30p, OM2 40, RCA 30p, 10p ISR, 90d (3.0x12 Synergy DES), AM 90, VG->OM2 known to be 100, LIMA->LAD not injected, patent in 2015.  . Carotid stenosis, right   . Chronic combined systolic and diastolic CHF (congestive heart failure) ([place])   . CKD (chronic kidney disease), stage III ([place])   . Dyspnea   . H/O: GI bleed    REMOTE HISTORY  . History of COPD   . Hyperlipidemia   . Hypertensive heart disease   . LBBB (left bundle branch block)   . Pacemaker Sept 2009   St Jude  . PAF (paroxysmal atrial fibrillation) (HCC)    a. not on anticoag due to prior GIB.  . Sick sinus syndrome [facility]) Sept 2009   ST Jude PTVDP  . Stroke [facility])     MEDICATIONS:  Prior to Admission medications   Medication Sig Start Date End Date Taking? Authorizing  Provider  acetaminophen (TYLENOL) 325 MG tablet Take 650 mg by mouth 3 (three) times daily.    [provider]  albuterol (PROVENTIL HFA;VENTOLIN HFA) 108 (90 Base) MCG/ACT inhaler Inhale 2 puffs into the lungs every 4 (four) hours as needed for wheezing or shortness of breath (or coughing). 05/27/18   Roxan Hockey, MD  ALPRAZolam Duanne Moron) 0.25 MG tablet Take 1 tablet (0.25 mg total) by mouth 2 (two) times daily as needed for anxiety. 05/27/18   Roxan Hockey, MD  Amino Acids-Protein Hydrolys (FEEDING SUPPLEMENT,  PRO-STAT SUGAR FREE 64,) LIQD Take 30 mLs by mouth 3 (three) times daily with meals. John Peck not taking: Reported on 08/09/2018 08/02/18   Edwin Dada, MD  aspirin 81 MG tablet Take 81 mg by mouth daily.     [provider]  atorvastatin (LIPITOR) 40 MG tablet Take 1 tablet (40 mg total) by mouth daily. John Peck taking differently: Take 40 mg by mouth every evening.  09/18/15   Troy Sine, MD  carvedilol (COREG) 12.5 MG tablet Take 1 tablet (12.5 mg total) by mouth 2 (two) times daily with a meal. 05/27/18   Emokpae, Courage, MD  clopidogrel (PLAVIX) 75 MG tablet Take 1 tablet (75 mg total) by mouth daily. 05/27/18   Roxan Hockey, MD  ezetimibe (ZETIA) 10 MG tablet Take 10 mg by mouth daily.    [provider]  feeding supplement, ENSURE COMPLETE, (ENSURE COMPLETE) LIQD Take 237 mLs by mouth 2 (two) times daily between meals. John Peck not taking: Reported on 08/09/2018 08/02/18   Edwin Dada, MD  ferrous sulfate 325 (65 FE) MG tablet Take 1 tablet (325 mg total) by mouth 2 (two) times daily with a meal. 05/27/18   Emokpae, Courage, MD  furosemide (LASIX) 20 MG tablet Take 1 tablet (20 mg total) by mouth 2 (two) times daily. 08/12/18   Domenic Polite, MD  ipratropium-albuterol (DUONEB) 0.5-2.5 (3) MG/3ML SOLN Take 3 mLs by nebulization 2 (two) times daily. 02/24/18   Mikhail, Velta Addison, DO  isosorbide mononitrate (IMDUR) 30 MG 24 hr tablet Take 3 tablets (90 mg total) by mouth daily. 05/28/18   Emokpae, Courage, MD  latanoprost (XALATAN) 0.005 % ophthalmic solution Place 1 drop into both eyes 2 (two) times a week. On mondays and wednesdays 11/07/15   [provider]  levothyroxine (SYNTHROID, LEVOTHROID) 125 MCG tablet TAKE 1 TABLET BY MOUTH EVERY DAY BEFORE BREAKFAST John Peck taking differently: Take 125 mcg by mouth daily before breakfast.  04/05/18   Troy Sine, MD  nitroGLYCERIN (NITROSTAT) 0.4 MG SL tablet Place 1 tablet (0.4 mg total) under the  tongue every 5 (five) minutes as needed for chest pain. 07/06/18   Isaac Bliss, Rayford Halsted, MD  pantoprazole (PROTONIX) 40 MG tablet Take 1 tablet (40 mg total) by mouth daily. 05/28/18   Roxan Hockey, MD  polyethylene glycol (MIRALAX / GLYCOLAX) packet Take 17 g by mouth daily. 05/27/18   Roxan Hockey, MD  senna (SENOKOT) 8.6 MG tablet Take 2 tablets by mouth 2 (two) times daily as needed for constipation.    [provider]  tamsulosin (FLOMAX) 0.4 MG CAPS capsule Take 1 capsule (0.4 mg total) by mouth daily. 05/27/18   Roxan Hockey, MD    ALLERGIES:  No Known Allergies  SOCIAL HISTORY:  Social History   Tobacco Use  . Smoking status: Former Smoker    Packs/day: 0.75    Years: 65.00    Pack years: 48.75    Types: Cigarettes  Last attempt to quit: 11/09/1997    Years since quitting: 20.8  . Smokeless tobacco: Former Systems developer    Quit date: 10/29/2008  Substance Use Topics  . Alcohol use: No    FAMILY HISTORY: Family History  Problem Relation Age of Onset  . Diabetes Father   . Stroke Sister   . Heart disease Sister   . Heart disease Brother   . Heart attack Brother   . Stroke Brother   . Hypertension Neg Hx     EXAM: BP 140/71 (BP Location: Right Arm)   Pulse 64   Temp 98.3 F (36.8 C) (Oral)   Resp 20   SpO2 98%  CONSTITUTIONAL: Alert and oriented and responds appropriately to questions.  Elderly, chronically ill-appearing, in no distress HEAD: Normocephalic EYES: Conjunctivae clear, pupils appear equal, EOMI ENT: normal nose; moist mucous membranes NECK: Supple, no meningismus, no nuchal rigidity, no LAD  CARD: RRR; S1 and S2 appreciated; no murmurs, no clicks, no rubs, no gallops RESP: Normal chest excursion without splinting or tachypnea; breath sounds clear and equal bilaterally; no wheezes, no rhonchi, no rales, no hypoxia or respiratory distress, speaking full sentences ABD/GI: Normal bowel sounds; non-distended; soft, non-tender, no  rebound, no guarding, no peritoneal signs, no hepatosplenomegaly BACK:  The back appears normal and is non-tender to palpation, there is no CVA tenderness EXT: Normal ROM in all joints; non-tender to palpation; no edema; normal capillary refill; no cyanosis, no calf tenderness or swelling    SKIN: Normal color for age and race; warm; no rash NEURO: Moves all extremities equally PSYCH: The John Peck's mood and manner are appropriate. Grooming and personal hygiene are appropriate.  MEDICAL DECISION MAKING: John Peck here with chest pain.  Feels similar to his previous anginal equivalent.  Troponin negative.  Chest x-ray clear.  No signs of volume overload.  Doubt PE or dissection given chest pain-free.  Will admit for ACS rule out.  We will also obtain urinalysis given complaints of dysuria.  ED PROGRESS:    12:10 AM Discussed John Peck's case with hospitalist, Dr. Myna Hidalgo.  I have recommended admission and John Peck (and family if present) agree with this plan. Admitting physician will place admission orders.   I reviewed all nursing notes, vitals, pertinent previous records, EKGs, lab and urine results, imaging (as available).     EKG Interpretation  Date/Time:  Tuesday August 30 2018 23:18:11 EDT Ventricular Rate:  62 PR Interval:    QRS Duration: 161 QT Interval:  469 QTC Calculation: 477 R Axis:   -46 Text Interpretation:  AV dual-paced rhythm No significant change since last tracing Confirmed by Ward, Cyril Mourning [phone]) on 08/31/2018 12:09:59 AM          Ward, Delice Bison, DO 08/31/18 6160

## 2018-08-30 NOTE — ED Notes (Signed)
Patient transported to X-ray 

## 2018-08-30 NOTE — ED Triage Notes (Signed)
BIB [facility] from [facility] with c/o of nagging chest pain. Facility gave 4 nitro, ems gave  1 nitro. Hx of angina, MI, and ventricular pacemaker. Pain 4/10 on arrival.

## 2018-08-31 ENCOUNTER — Encounter (HOSPITAL_COMMUNITY): Payer: Self-pay | Admitting: Family Medicine

## 2018-08-31 ENCOUNTER — Other Ambulatory Visit: Payer: Self-pay

## 2018-08-31 DIAGNOSIS — E039 Hypothyroidism, unspecified: Secondary | ICD-10-CM | POA: Diagnosis not present

## 2018-08-31 DIAGNOSIS — I447 Left bundle-branch block, unspecified: Secondary | ICD-10-CM | POA: Diagnosis not present

## 2018-08-31 DIAGNOSIS — I5022 Chronic systolic (congestive) heart failure: Secondary | ICD-10-CM | POA: Diagnosis not present

## 2018-08-31 DIAGNOSIS — I2510 Atherosclerotic heart disease of native coronary artery without angina pectoris: Secondary | ICD-10-CM | POA: Diagnosis not present

## 2018-08-31 DIAGNOSIS — N183 Chronic kidney disease, stage 3 (moderate): Secondary | ICD-10-CM | POA: Diagnosis not present

## 2018-08-31 DIAGNOSIS — D539 Nutritional anemia, unspecified: Secondary | ICD-10-CM

## 2018-08-31 DIAGNOSIS — J449 Chronic obstructive pulmonary disease, unspecified: Secondary | ICD-10-CM

## 2018-08-31 DIAGNOSIS — R079 Chest pain, unspecified: Secondary | ICD-10-CM

## 2018-08-31 DIAGNOSIS — I25110 Atherosclerotic heart disease of native coronary artery with unstable angina pectoris: Secondary | ICD-10-CM | POA: Diagnosis not present

## 2018-08-31 DIAGNOSIS — I480 Paroxysmal atrial fibrillation: Secondary | ICD-10-CM | POA: Diagnosis not present

## 2018-08-31 DIAGNOSIS — I130 Hypertensive heart and chronic kidney disease with heart failure and stage 1 through stage 4 chronic kidney disease, or unspecified chronic kidney disease: Secondary | ICD-10-CM | POA: Diagnosis not present

## 2018-08-31 DIAGNOSIS — R0789 Other chest pain: Secondary | ICD-10-CM | POA: Diagnosis not present

## 2018-08-31 DIAGNOSIS — I5042 Chronic combined systolic (congestive) and diastolic (congestive) heart failure: Secondary | ICD-10-CM | POA: Diagnosis not present

## 2018-08-31 DIAGNOSIS — Z66 Do not resuscitate: Secondary | ICD-10-CM | POA: Diagnosis not present

## 2018-08-31 DIAGNOSIS — E785 Hyperlipidemia, unspecified: Secondary | ICD-10-CM | POA: Diagnosis not present

## 2018-08-31 LAB — BASIC METABOLIC PANEL
ANION GAP: 7 (ref 5–15)
BUN: 38 mg/dL — ABNORMAL HIGH (ref 8–23)
CHLORIDE: 110 mmol/L (ref 98–111)
CO2: 24 mmol/L (ref 22–32)
CREATININE: 1.73 mg/dL — AB (ref 0.61–1.24)
Calcium: 8.6 mg/dL — ABNORMAL LOW (ref 8.9–10.3)
GFR calc non Af Amer: 33 mL/min — ABNORMAL LOW (ref >60)
GFR, EST AFRICAN AMERICAN: 38 mL/min — AB (ref >60)
Glucose, Bld: 89 mg/dL (ref 70–99)
POTASSIUM: 3.9 mmol/L (ref 3.5–5.1)
SODIUM: 141 mmol/L (ref 135–145)

## 2018-08-31 LAB — CBC
HCT: 26.1 % — ABNORMAL LOW (ref 39.0–52.0)
Hemoglobin: 8.5 g/dL — ABNORMAL LOW (ref 13.0–17.0)
MCH: 31.8 pg (ref 26.0–34.0)
MCHC: 32.6 g/dL (ref 30.0–36.0)
MCV: 97.8 fL (ref 80.0–100.0)
PLATELETS: 159 10*3/uL (ref 150–400)
RBC: 2.67 MIL/uL — AB (ref 4.22–5.81)
RDW: 14.5 % (ref 11.5–15.5)
WBC: 4.2 10*3/uL (ref 4.0–10.5)
nRBC: 0 % (ref 0.0–0.2)

## 2018-08-31 LAB — URINALYSIS, ROUTINE W REFLEX MICROSCOPIC
BILIRUBIN URINE: NEGATIVE
GLUCOSE, UA: NEGATIVE mg/dL
HGB URINE DIPSTICK: NEGATIVE
KETONES UR: NEGATIVE mg/dL
LEUKOCYTES UA: NEGATIVE
Nitrite: NEGATIVE
PROTEIN: NEGATIVE mg/dL
Specific Gravity, Urine: 1.018 (ref 1.005–1.030)
pH: 5 (ref 5.0–8.0)

## 2018-08-31 LAB — IRON AND TIBC
Iron: 29 ug/dL — ABNORMAL LOW (ref 45–182)
Saturation Ratios: 18 % (ref 17.9–39.5)
TIBC: 158 ug/dL — AB (ref 250–450)
UIBC: 129 ug/dL

## 2018-08-31 LAB — FERRITIN: Ferritin: 494 ng/mL — ABNORMAL HIGH (ref 24–336)

## 2018-08-31 LAB — RETICULOCYTES
IMMATURE RETIC FRACT: 13 % (ref 2.3–15.9)
RBC.: 2.67 MIL/uL — AB (ref 4.22–5.81)
Retic Count, Absolute: 52.1 10*3/uL (ref 19.0–186.0)
Retic Ct Pct: 2 % (ref 0.4–3.1)

## 2018-08-31 LAB — TROPONIN I
TROPONIN I: 0.13 ng/mL — AB (ref <0.03)
TROPONIN I: 0.66 ng/mL — AB (ref <0.03)

## 2018-08-31 LAB — MRSA PCR SCREENING: MRSA BY PCR: NEGATIVE

## 2018-08-31 LAB — FOLATE: Folate: 8.4 ng/mL (ref >5.9)

## 2018-08-31 LAB — VITAMIN B12: VITAMIN B 12: 443 pg/mL (ref 180–914)

## 2018-08-31 MED ORDER — ASPIRIN 81 MG PO CHEW
81.0000 mg | CHEWABLE_TABLET | Freq: Every day | ORAL | Status: DC
  Administered 2018-08-31 – 2018-09-01 (×2): 81 mg via ORAL
  Filled 2018-08-31 (×2): qty 1

## 2018-08-31 MED ORDER — MUPIROCIN 2 % EX OINT
1.0000 "application " | TOPICAL_OINTMENT | Freq: Two times a day (BID) | CUTANEOUS | Status: DC
  Administered 2018-08-31 – 2018-09-01 (×4): 1 via TOPICAL
  Filled 2018-08-31 (×2): qty 22

## 2018-08-31 MED ORDER — TAMSULOSIN HCL 0.4 MG PO CAPS
0.4000 mg | ORAL_CAPSULE | Freq: Every day | ORAL | Status: DC
  Administered 2018-08-31 – 2018-09-01 (×2): 0.4 mg via ORAL
  Filled 2018-08-31 (×2): qty 1

## 2018-08-31 MED ORDER — FUROSEMIDE 20 MG PO TABS
20.0000 mg | ORAL_TABLET | Freq: Two times a day (BID) | ORAL | Status: DC
  Administered 2018-08-31 – 2018-09-01 (×3): 20 mg via ORAL
  Filled 2018-08-31 (×3): qty 1

## 2018-08-31 MED ORDER — ONDANSETRON HCL 4 MG/2ML IJ SOLN: 4.0000 mg | Freq: Four times a day (QID) | INTRAMUSCULAR | Status: DC | PRN

## 2018-08-31 MED ORDER — IPRATROPIUM-ALBUTEROL 0.5-2.5 (3) MG/3ML IN SOLN
3.0000 mL | Freq: Two times a day (BID) | RESPIRATORY_TRACT | Status: DC
  Administered 2018-08-31 – 2018-09-01 (×3): 3 mL via RESPIRATORY_TRACT
  Filled 2018-08-31 (×3): qty 3

## 2018-08-31 MED ORDER — RESOURCE THICKENUP CLEAR PO POWD
ORAL | Status: DC | PRN
  Filled 2018-08-31: qty 125

## 2018-08-31 MED ORDER — ISOSORBIDE MONONITRATE ER 30 MG PO TB24
90.0000 mg | ORAL_TABLET | Freq: Every day | ORAL | Status: DC
  Administered 2018-08-31 – 2018-09-01 (×2): 90 mg via ORAL
  Filled 2018-08-31 (×2): qty 3

## 2018-08-31 MED ORDER — CARVEDILOL 12.5 MG PO TABS
12.5000 mg | ORAL_TABLET | Freq: Two times a day (BID) | ORAL | Status: DC
  Administered 2018-08-31 – 2018-09-01 (×3): 12.5 mg via ORAL
  Filled 2018-08-31 (×3): qty 1

## 2018-08-31 MED ORDER — ASPIRIN 300 MG RE SUPP: 300.0000 mg | RECTAL | Status: AC

## 2018-08-31 MED ORDER — GERHARDT'S BUTT CREAM
TOPICAL_CREAM | Freq: Three times a day (TID) | CUTANEOUS | Status: DC | PRN
  Administered 2018-08-31: 21:00:00 via TOPICAL
  Filled 2018-08-31 (×2): qty 1

## 2018-08-31 MED ORDER — ALBUTEROL SULFATE (2.5 MG/3ML) 0.083% IN NEBU: 3.0000 mL | INHALATION_SOLUTION | RESPIRATORY_TRACT | Status: DC | PRN

## 2018-08-31 MED ORDER — ATORVASTATIN CALCIUM 40 MG PO TABS
40.0000 mg | ORAL_TABLET | Freq: Every evening | ORAL | Status: DC
  Administered 2018-08-31: 40 mg via ORAL
  Filled 2018-08-31: qty 1

## 2018-08-31 MED ORDER — PANTOPRAZOLE SODIUM 40 MG PO TBEC
40.0000 mg | DELAYED_RELEASE_TABLET | Freq: Every day | ORAL | Status: DC
  Administered 2018-08-31 – 2018-09-01 (×2): 40 mg via ORAL
  Filled 2018-08-31 (×2): qty 1

## 2018-08-31 MED ORDER — CLOPIDOGREL BISULFATE 75 MG PO TABS
75.0000 mg | ORAL_TABLET | Freq: Every day | ORAL | Status: DC
  Administered 2018-08-31 – 2018-09-01 (×2): 75 mg via ORAL
  Filled 2018-08-31 (×2): qty 1

## 2018-08-31 MED ORDER — RANOLAZINE ER 500 MG PO TB12
500.0000 mg | ORAL_TABLET | Freq: Two times a day (BID) | ORAL | Status: DC
  Administered 2018-08-31 – 2018-09-01 (×2): 500 mg via ORAL
  Filled 2018-08-31 (×2): qty 1

## 2018-08-31 MED ORDER — ASPIRIN 81 MG PO CHEW: 324.0000 mg | CHEWABLE_TABLET | ORAL | Status: AC

## 2018-08-31 MED ORDER — ACETAMINOPHEN 325 MG PO TABS: 650.0000 mg | ORAL_TABLET | ORAL | Status: DC | PRN

## 2018-08-31 MED ORDER — POLYETHYLENE GLYCOL 3350 17 G PO PACK
17.0000 g | PACK | Freq: Every day | ORAL | Status: DC
  Administered 2018-09-01: 17 g via ORAL
  Filled 2018-08-31: qty 1

## 2018-08-31 MED ORDER — LEVOTHYROXINE SODIUM 125 MCG PO TABS
125.0000 ug | ORAL_TABLET | Freq: Every day | ORAL | Status: DC
  Administered 2018-09-01: 125 ug via ORAL
  Filled 2018-08-31 (×2): qty 1

## 2018-08-31 MED ORDER — EZETIMIBE 10 MG PO TABS
10.0000 mg | ORAL_TABLET | Freq: Every day | ORAL | Status: DC
  Administered 2018-08-31 – 2018-09-01 (×2): 10 mg via ORAL
  Filled 2018-08-31 (×2): qty 1

## 2018-08-31 MED ORDER — NITROGLYCERIN 0.4 MG SL SUBL: 0.4000 mg | SUBLINGUAL_TABLET | SUBLINGUAL | Status: DC | PRN

## 2018-08-31 NOTE — Progress Notes (Signed)
Patient ID: John Peck, male   DOB: 1924/03/19, 84 y.o.   MRN: [id number] Patient was admitted early this morning for chest pain.  Troponin this morning is 0.13.  Patient seen and examined at bedside.  Currently having intermittent chest pains.  This morning's H&P and prior medical records were reviewed by myself.  I have consulted cardiology.  Await recommendations.  Palliative care consult for goals of care has also been placed.  Repeat a.m. labs

## 2018-08-31 NOTE — Progress Notes (Signed)
CRITICAL VALUE ALERT  Critical Value:  Troponin: 0.13  Date & Time Notied: 08/31/2018 2194  Provider Notified: [facility]

## 2018-08-31 NOTE — Evaluation (Signed)
Physical Therapy Evaluation Patient Details Name: SHONDALE QUINLEY MRN: [id number] DOB: 1924-02-14 Today's Date: 08/31/2018   History of Present Illness  84 y.o. male with complex medical history inclusive of  CAD, HTN, CKD stage III, hypothyroidism, dementia, pleural effusion s/p thoracentesis 05/2018,  anemia, chronic heart failure, stroke, prior liver abscess, COPD, PAD/PVD, and HLD who presents to hospital with complaints of chest pain.  Clinical Impression  Orders received for PT evaluation. Patient demonstrates deficits in functional mobility as indicated below. Will benefit from continued skilled PT to address deficits and maximize function. Will see as indicated and progress as tolerated.  At this time, patient was able to ambulate in room with use of RW and reports no active chest pain. Only pain reported was in his feet. VSS throughout activity with the exception of 2 minutes of afib per monitor but returned to NSR. Hygiene and pericare performed due to incontinence. Nsg notified. Recommend return to SNF.    Follow Up Recommendations SNF;Supervision/Assistance - 24 hour    Equipment Recommendations  None recommended by PT    Recommendations for Other Services       Precautions / Restrictions Precautions Precautions: Fall      Mobility  Bed Mobility Overal bed mobility: Needs Assistance Bed Mobility: Supine to Sit Rolling: Min assist Sidelying to sit: Min assist       General bed mobility comments: min assist to bring LEs to EOb and elevate to upright, increase time and effort to perform  Transfers Overall transfer level: Needs assistance Equipment used: Rolling walker (2 wheeled) Transfers: Sit to/from [facility] Sit to Stand: Min assist         General transfer comment: Min assist to power to upright and provide stability with use of RW  Ambulation/Gait Ambulation/Gait assistance: Min assist Gait Distance (Feet): 20 Feet Assistive device:  Rolling walker (2 wheeled) Gait Pattern/deviations: Step-to pattern;Decreased stride length;Shuffle;Antalgic Gait velocity: decreased Gait velocity interpretation: <1.31 ft/sec, indicative of household ambulator General Gait Details: ambulated in room with RW, limited by pain during loading response left foot. Min assist for stability and safety.  Stairs            Wheelchair Mobility    Modified Rankin (Stroke Patients Only)       Balance Overall balance assessment: Needs assistance Sitting-balance support: Feet supported;Bilateral upper extremity supported Sitting balance-Leahy Scale: Fair Sitting balance - Comments: able to sit EOb without physical support   Standing balance support: Bilateral upper extremity supported;During functional activity Standing balance-Leahy Scale: Poor Standing balance comment: Reliant on BUE and external support                              Pertinent Vitals/Pain Pain Assessment: Faces Faces Pain Scale: Hurts even more Pain Location: bilateral feet  left great toe Pain Descriptors / Indicators: Aching;Grimacing Pain Intervention(s): Limited activity within patient's tolerance;Monitored during session;Repositioned    Home Living Family/patient expects to be discharged to:: Skilled nursing facility Living Arrangements: Other (Comment)   Type of Home: [place]: [place] - 4 wheels;Hospital bed Additional Comments: split level house, level entry, able to live on main floor.     Prior Function Level of Independence: Needs assistance   Gait / Transfers Assistance Needed: per chart review he has been using a WC and staff has to help assist to WC. Patient reports use of RW with  staff  ADL's / Homemaking Assistance Needed: Pt's family reports the staff at [facility] assist with all ADLs.   Comments: used 4 wheel walker     Hand Dominance   Dominant Hand: Right    Extremity/Trunk Assessment    Upper Extremity Assessment Upper Extremity Assessment: Generalized weakness    Lower Extremity Assessment Lower Extremity Assessment: Generalized weakness    Cervical / Trunk Assessment Cervical / Trunk Assessment: Kyphotic  Communication   Communication: HOH  Cognition Arousal/Alertness: Awake/alert Behavior During Therapy: WFL for tasks assessed/performed Overall Cognitive Status: History of cognitive impairments - at baseline                                 General Comments: Dementia at baseline. able to recognize family and was oriented X3. Disoriented to situation.       General Comments General comments (skin integrity, edema, etc.): hygiene and pericare performed during session    Exercises     Assessment/Plan    PT Assessment Patient needs continued PT services  PT Problem List Decreased strength;Decreased mobility;Decreased safety awareness;Decreased activity tolerance;Decreased balance;Decreased knowledge of use of DME;Decreased cognition       PT Treatment Interventions DME instruction;Therapeutic activities;Gait training;Therapeutic exercise;Patient/family education;Functional mobility training;Balance training    PT Goals (Current goals can be found in the Care Plan section)  Acute Rehab PT Goals Patient Stated Goal: to return to SNF PT Goal Formulation: With patient Time For Goal Achievement: 09/14/18 Potential to Achieve Goals: Fair    Frequency Min 2X/week   Barriers to discharge        Co-evaluation               AM-PAC PT "6 Clicks" Daily Activity  Outcome Measure Difficulty turning over in bed (including adjusting bedclothes, sheets and blankets)?: Unable Difficulty moving from lying on back to sitting on the side of the bed? : Unable Difficulty sitting down on and standing up from a chair with arms (e.g., wheelchair, bedside commode, etc,.)?: Unable Help needed moving to and from a bed to chair (including a  wheelchair)?: A Lot Help needed walking in hospital room?: A Lot Help needed climbing 3-5 steps with a railing? : Total 6 Click Score: 8    End of Session Equipment Utilized During Treatment: Gait belt Activity Tolerance: Patient tolerated treatment well Patient left: in chair;with call bell/phone within reach;with nursing/sitter in room;with family/visitor present;with chair alarm set Nurse Communication: Mobility status PT Visit Diagnosis: Other abnormalities of gait and mobility (R26.89);Muscle weakness (generalized) (M62.81);Unsteadiness on feet (R26.81)    Time: 2979-8921 PT Time Calculation (min) (ACUTE ONLY): 20 min   Charges:     PT Treatments $Therapeutic Activity: 8-22 mins        Alben Deeds, PT DPT  Board Certified Neurologic Specialist [place] Pager [phone] Office [phone]   Duncan Dull 08/31/2018, 2:10 PM

## 2018-08-31 NOTE — Clinical Social Work Note (Signed)
Clinical Social Work Assessment  Patient Details  Name: John Peck MRN: [id number] Date of Birth: Sep 16, 1924  Date of referral:  08/31/18               Reason for consult:  Discharge Planning                Permission sought to share information with:    Permission granted to share information::  Yes, Verbal Permission Granted  Name::     [place]::  [place]  Relationship::  Granddaughter  Contact Information:  [phone]  Housing/Transportation Living arrangements for the past 2 months:  [place] of Information:  Patient, Facility Patient Interpreter Needed:  None Criminal Activity/Legal Involvement Pertinent to Current Situation/Hospitalization:  No - Comment as needed Significant Relationships:  Adult Children, Other Family Members, Community Support Lives with:  Facility Resident Do you feel safe going back to the place where you live?  Yes Need for family participation in patient care:  Yes (Comment)  Care giving concerns:  No family at bedside. Patient is from [facility] long term   Facilities manager / plan:  CSW met patient at bedside to discuss discharge plan and offer support. Patient stated he is from [facility] and will go back once medically ready. Patient stated he feels safe at [facility] and he is being taken care of at facility. CSW reached out to facility and facility stated they are able to take him back  Employment status:  Retired Nurse, adult PT Recommendations:  [place] / Referral to community resources:  [place]  Patient/Family's Response to care:  Patient appreciates CSW role in care  Patient/Family's Understanding of and Emotional Response to Diagnosis, Current Treatment, and Prognosis:  Patient will return back to facility once medically ready  Emotional Assessment Appearance:  Appears stated  age Attitude/Demeanor/Rapport:  Engaged Affect (typically observed):  Accepting, Pleasant Orientation:  Oriented to Self, Oriented to Situation, Oriented to Place, Oriented to  Time Alcohol / Substance use:  Not Applicable Psych involvement (Current and /or in the community):  No (Comment)  Discharge Needs  Concerns to be addressed:  Care Coordination Readmission within the last 30 days:  Yes Current discharge risk:  None Barriers to Discharge:  No Barriers Identified   Wende Neighbors, LCSW 08/31/2018, 11:10 AM

## 2018-08-31 NOTE — Consult Note (Addendum)
Cardiology Consultation:   Patient ID: John Peck MRN: [id number]; DOB: October 11, 1924  Admit date: 08/30/2018 Date of Consult: 08/31/2018  Primary Care Provider: Seward Carol, MD Primary Cardiologist: Shelva Majestic, MD  Primary Electrophysiologist:  Cristopher Peru, MD    Patient Profile:   John Peck is a 84 y.o. male with a hx of CAD (s/p CABG 1989, DES to RCA 2017, DES to prox LCx 02/2017), PAF not on [facility] for GIB, SSS s/p St Jude PPM in place, HTN, CKD stage III, hypothyroidism, dementia, pleural effusion s/p thoracentesis 05/2018, iron deficiency anemia with pancytopenia of unknown cause, chronic combined systolic and diastolic heart failure, severe R carotid artery disease, LBBB, stroke, prior liver abscess, COPD, PAD/PVD, and HLD who is being seen today for the evaluation of chest pain at the request of Dr. Starla Link.  History of Present Illness:   Mr. Luber last cath 02/2017 with DAPT for at least 1 year but preferably indefinitely.  Pt has been admitted multiple times in 2019. Admission 02/2018 for difficulty speaking and abnormal gait, left facial droop, and le weakness. CT head negative, no MRI with PPM in place. Neurology concluded symptoms likely due to cerebral hypoperfusion with high-grade right carotid stenosis. Admission 03/2017 for HCAP; 05/2018 with acute respiratory failure with hypoxia found to have R pleural effusion s/p thoracentesis. cytology negative for malignancy. An 06/2018 admission for metabolic encephalopathy and UTI. He has suffered weight loss of 30 lbs over the year and has poor appetite. Hospitalized 07/2018 for SOB and chest pain, found to have recurrence of R pleural effusion, suspected malignancy.  Admission 08/2018 with AKI and altered mental status; primary suspected mild baseline dementia.    He [facility] to [facility] from SNF with complaints of chest pain. He was given "several" doses of SL nitro at the facility and with EMS. On arrival, CXR with small pleural  effusion, stable cardiomegaly. Hb 8.8 (11.2). sCr 1.82. EKG with Afib and LBBB. Troponin mildly elevated: 0.05 --> 0.13.   On my interview, he states that he started having chest pain that was partially relieved with his third nitroglycerin tablet. Pain rated as a 7-8/10.  He has baseline SOB and arthritis in his neck. He does report radiation to his left elbow and nausea. He denies diaphoresis. He states this is his usual chest pain, but was not relieved with his normal nitro x 1. He has not had a recurrence of chest pain since being in the hospital.    Past Medical History:  Diagnosis Date  . Anemia   . Arthritis   . CAD (coronary artery disease)    a. CABG '89; b. PCI '04; c. 11/2015 Cath/PCI: LM 20ost, LAD 141m, LCX 30p, OM2 40, RCA 30p, 10p ISR, 90d (3.0x12 Synergy DES), AM 90, VG->OM2 known to be 100, LIMA->LAD not injected, patent in 2015.  . Carotid stenosis, right   . Chronic combined systolic and diastolic CHF (congestive heart failure) ([place])   . CKD (chronic kidney disease), stage III ([place])   . Dyspnea   . H/O: GI bleed    REMOTE HISTORY  . History of COPD   . Hyperlipidemia   . Hypertensive heart disease   . LBBB (left bundle branch block)   . Pacemaker Sept 2009   St Jude  . PAF (paroxysmal atrial fibrillation) (HCC)    a. not on anticoag due to prior GIB.  . Sick sinus syndrome [facility]) Sept 2009   ST Jude PTVDP  . Stroke [facility])  Past Surgical History:  Procedure Laterality Date  . CARDIAC CATHETERIZATION  11/2011   EF 50%; significant native CAD w/80% stenosis of the LAD after 2nd giagonal vessel and septal perforating artery w/total occlusion of the mid left anterior descendiung.; 60-70% ostiasl stenosis in the circumflex vessel followed by 40% proximal stenosis and 70-80% distal circumflex stenosis;   . CARDIAC CATHETERIZATION N/A 11/29/2015   Procedure: Left Heart Cath and Cors/Grafts Angiography;  Surgeon: Burnell Blanks, MD;  Location: [place] CV LAB;   Service: Cardiovascular;  Laterality: N/A;  . CARDIAC CATHETERIZATION N/A 11/29/2015   Procedure: Coronary Stent Intervention;  Surgeon: Burnell Blanks, MD;  Location: [place] CV LAB;  Service: Cardiovascular;  Laterality: N/A;  . CATARACT EXTRACTION  2004  . CORONARY ANGIOGRAPHY N/A 02/25/2017   Procedure: Coronary Angiography;  Surgeon: Belva Crome, MD;  Location: [place] CV LAB;  Service: Cardiovascular;  Laterality: N/A;  . CORONARY ANGIOPLASTY WITH STENT PLACEMENT  2004   RCA  . CORONARY ARTERY BYPASS GRAFT  1989   had LIMA to his LAD, a vein to the circumflex. In 2004 underwent stenting to his right coronary artery.  . CORONARY STENT INTERVENTION N/A 02/24/2017   Procedure: Coronary Stent Intervention;  Surgeon: Wellington Hampshire, MD;  Location: [place] CV LAB;  Service: Cardiovascular;  Laterality: N/A;  cfx  . HEMORRHOID SURGERY    . INSERT / REPLACE / REMOVE PACEMAKER  07/17/08   DUAL-CHAMBER; PPM-ST.JUDE MEDNET  . IR THORACENTESIS ASP PLEURAL SPACE W/IMG GUIDE  05/25/2018  . IR THORACENTESIS ASP PLEURAL SPACE W/IMG GUIDE  08/01/2018  . LEFT HEART CATH AND CORS/GRAFTS ANGIOGRAPHY N/A 02/24/2017   Procedure: Left Heart Cath and Cors/Grafts Angiography;  Surgeon: Wellington Hampshire, MD;  Location: [place] CV LAB;  Service: Cardiovascular;  Laterality: N/A;  . LEFT HEART CATHETERIZATION WITH CORONARY/GRAFT ANGIOGRAM N/A 12/10/2011   Procedure: LEFT HEART CATHETERIZATION WITH Beatrix Fetters;  Surgeon: Troy Sine, MD;  Location: [facility] C[facility] LAB;  Service: Cardiovascular;  Laterality: N/A;  . LEFT HEART CATHETERIZATION WITH CORONARY/GRAFT ANGIOGRAM N/A 01/26/2014   Procedure: LEFT HEART CATHETERIZATION WITH Beatrix Fetters;  Surgeon: Troy Sine, MD;  Location: [facility] CATH LAB;  Service: Cardiovascular;  Laterality: N/A;  . PPM GENERATOR CHANGEOUT N/A 12/15/2017   Procedure: PPM GENERATOR CHANGEOUT;  Surgeon: Evans Lance, MD;  Location: [place] CV LAB;   Service: Cardiovascular;  Laterality: N/A;  . PROSTATECTOMY       Home Medications:  Prior to Admission medications   Medication Sig Start Date End Date Taking? Authorizing Provider  acetaminophen (TYLENOL) 325 MG tablet Take 650 mg by mouth 3 (three) times daily.   Yes [provider]  albuterol (PROVENTIL HFA;VENTOLIN HFA) 108 (90 Base) MCG/ACT inhaler Inhale 2 puffs into the lungs every 4 (four) hours as needed for wheezing or shortness of breath (or coughing). 05/27/18  Yes Emokpae, Courage, MD  aspirin 81 MG tablet Take 81 mg by mouth daily.    Yes [provider]  atorvastatin (LIPITOR) 40 MG tablet Take 1 tablet (40 mg total) by mouth daily. Patient taking differently: Take 40 mg by mouth every evening.  09/18/15  Yes Troy Sine, MD  carvedilol (COREG) 12.5 MG tablet Take 1 tablet (12.5 mg total) by mouth 2 (two) times daily with a meal. 05/27/18  Yes Emokpae, Courage, MD  clopidogrel (PLAVIX) 75 MG tablet Take 1 tablet (75 mg total) by mouth daily. 05/27/18  Yes Roxan Hockey, MD  ezetimibe (ZETIA) 10 MG tablet Take 10 mg by mouth daily.   Yes [provider]  ferrous sulfate 325 (65 FE) MG tablet Take 1 tablet (325 mg total) by mouth 2 (two) times daily with a meal. 05/27/18  Yes Emokpae, Courage, MD  furosemide (LASIX) 20 MG tablet Take 1 tablet (20 mg total) by mouth 2 (two) times daily. 08/12/18  Yes Domenic Polite, MD  ipratropium-albuterol (DUONEB) 0.5-2.5 (3) MG/3ML SOLN Take 3 mLs by nebulization 2 (two) times daily. 02/24/18  Yes Mikhail, Velta Addison, DO  isosorbide mononitrate (IMDUR) 30 MG 24 hr tablet Take 3 tablets (90 mg total) by mouth daily. 05/28/18  Yes Emokpae, Courage, MD  latanoprost (XALATAN) 0.005 % ophthalmic solution Place 1 drop into both eyes 2 (two) times a week. On mondays and wednesdays 11/07/15  Yes [provider]  levothyroxine (SYNTHROID, LEVOTHROID) 125 MCG tablet TAKE 1 TABLET BY MOUTH EVERY DAY BEFORE BREAKFAST Patient  taking differently: Take 125 mcg by mouth daily before breakfast.  04/05/18  Yes Troy Sine, MD  mupirocin ointment (BACTROBAN) 2 % Place 1 application into the nose 2 (two) times daily. To left foot for ingrown toenail   Yes [provider]  nitroGLYCERIN (NITROSTAT) 0.4 MG SL tablet Place 1 tablet (0.4 mg total) under the tongue every 5 (five) minutes as needed for chest pain. 07/06/18  Yes Isaac Bliss, Rayford Halsted, MD  pantoprazole (PROTONIX) 40 MG tablet Take 1 tablet (40 mg total) by mouth daily. 05/28/18  Yes Emokpae, Courage, MD  polyethylene glycol (MIRALAX / GLYCOLAX) packet Take 17 g by mouth daily. 05/27/18  Yes Roxan Hockey, MD  senna (SENOKOT) 8.6 MG tablet Take 2 tablets by mouth 2 (two) times daily as needed for constipation.   Yes [provider]  tamsulosin (FLOMAX) 0.4 MG CAPS capsule Take 1 capsule (0.4 mg total) by mouth daily. 05/27/18  Yes Emokpae, Courage, MD  Amino Acids-Protein Hydrolys (FEEDING SUPPLEMENT, PRO-STAT SUGAR FREE 64,) LIQD Take 30 mLs by mouth 3 (three) times daily with meals. Patient not taking: Reported on 08/09/2018 08/02/18   Edwin Dada, MD  feeding supplement, ENSURE COMPLETE, (ENSURE COMPLETE) LIQD Take 237 mLs by mouth 2 (two) times daily between meals. Patient not taking: Reported on 08/09/2018 08/02/18   Edwin Dada, MD    Inpatient Medications: Scheduled Meds: . aspirin  81 mg Oral Daily  . atorvastatin  40 mg Oral QPM  . carvedilol  12.5 mg Oral BID WC  . clopidogrel  75 mg Oral Daily  . ezetimibe  10 mg Oral Daily  . furosemide  20 mg Oral BID  . ipratropium-albuterol  3 mL Nebulization BID  . isosorbide mononitrate  90 mg Oral Daily  . levothyroxine  125 mcg Oral QAC breakfast  . mupirocin ointment  1 application Topical BID  . pantoprazole  40 mg Oral Daily  . polyethylene glycol  17 g Oral Daily  . tamsulosin  0.4 mg Oral Daily   Continuous Infusions:  PRN Meds: acetaminophen, albuterol,  nitroGLYCERIN, ondansetron (ZOFRAN) IV  Allergies:   No Known Allergies  Social History:   Social History   Socioeconomic History  . Marital status: Married    Spouse name: Not on file  . Number of children: 5  . Years of education: Not on file  . Highest education level: Not on file  Occupational History  . Occupation: RETIRED    Employer: RETIRED    Comment: [employer]  .  Financial resource strain: Not hard at all  . Food insecurity:    Worry: Never true    Inability: Never true  . Transportation needs:    Medical: No    Non-medical: No  Tobacco Use  . Smoking status: Former Smoker    Packs/day: 0.75    Years: 65.00    Pack years: 48.75    Types: Cigarettes    Last attempt to quit: 11/09/1997    Years since quitting: 20.8  . Smokeless tobacco: Former Systems developer    Quit date: 10/29/2008  Substance and Sexual Activity  . Alcohol use: No  . Drug use: No  . Sexual activity: Not Currently  Lifestyle  . Physical activity:    Days per week: 0 days    Minutes per session: 0 min  . Stress: Not on file  Relationships  . Social connections:    Talks on phone: Never    Gets together: More than three times a week    Attends religious service: Never    Active member of club or organization: Yes    Attends meetings of clubs or organizations: Never    Relationship status: Married  . Intimate partner violence:    Fear of current or ex partner: Not on file    Emotionally abused: Not on file    Physically abused: Not on file    Forced sexual activity: Not on file  Other Topics Concern  . Not on file  Social History Narrative  . Not on file    Family History:    Family History  Problem Relation Age of Onset  . Diabetes Father   . Stroke Sister   . Heart disease Sister   . Heart disease Brother   . Heart attack Brother   . Stroke Brother   . Hypertension Neg Hx      ROS:  Please see the history of present illness.   All other ROS reviewed and  negative.     Physical Exam/Data:   Vitals:   08/31/18 0045 08/31/18 0115 08/31/18 0715 08/31/18 0740  BP: (!) 113/55   (!) 142/78  Pulse: 65  62   Resp: 17  16 18   Temp:  98.3 F (36.8 C)  98.7 F (37.1 C)  TempSrc:  Oral  Oral  SpO2: 100%  100% 100%  Weight:  57.4 kg    Height:  5\' 9"  (1.753 m)     No intake or output data in the 24 hours ending 08/31/18 1034 Filed Weights   08/31/18 0115  Weight: 57.4 kg   Body mass index is 18.69 kg/m.  General:  Well nourished, well developed, in no acute distress HEENT: normal Neck: no JVD Vascular: No carotid bruits Cardiac:  regular rhythm, regular rate Lungs:  clear to auscultation bilaterally, no wheezing, rhonchi or rales  Abd: soft, nontender, no hepatomegaly  Ext: no edema Musculoskeletal:  No deformities, BUE and BLE strength normal and equal Skin: warm and dry  Neuro:  CNs 2-12 intact, no focal abnormalities noted Psych:  Normal affect   EKG:  The EKG was personally reviewed and demonstrates:  Paced rhythm Telemetry:  Telemetry was personally reviewed and demonstrates:  Afib  Relevant CV Studies:  Echo 03/2018: Study Conclusions - Left ventricle: The cavity size was normal. Wall thickness was   increased in a pattern of moderate LVH. Systolic function was   mildly reduced. The estimated ejection fraction was in the range   of 45% to 50%. Hypokinesis of  the inferior myocardium. - Aortic valve: There was mild regurgitation. - Mitral valve: There was moderate regurgitation. - Left atrium: The atrium was moderately dilated. - Right ventricle: The cavity size was mildly dilated. Wall   thickness was normal. - Right atrium: The atrium was moderately dilated. - Tricuspid valve: There was moderate regurgitation. - Pulmonary arteries: Systolic pressure was moderately increased.   PA peak pressure: 48 mm Hg (S).   Laboratory Data:  Chemistry Recent Labs  Lab 08/30/18 2322 08/31/18 0454  NA 140 141  K 3.9 3.9    CL 108 110  CO2 23 24  GLUCOSE 102* 89  BUN 41* 38*  CREATININE 1.82* 1.73*  CALCIUM 8.5* 8.6*  GFRNONAA 31* 33*  GFRAA 35* 38*  ANIONGAP 9 7    No results for input(s): PROT, ALBUMIN, AST, ALT, ALKPHOS, BILITOT in the last 168 hours. Hematology Recent Labs  Lab 08/30/18 2322 08/31/18 0454  WBC 4.7 4.2  RBC 2.81* 2.67*  2.67*  HGB 8.8* 8.5*  HCT 28.4* 26.1*  MCV 101.1* 97.8  MCH 31.3 31.8  MCHC 31.0 32.6  RDW 14.3 14.5  PLT 154 159   Cardiac Enzymes Recent Labs  Lab 08/31/18 0454  TROPONINI 0.13*    Recent Labs  Lab 08/30/18 2328  TROPIPOC 0.05    BNPNo results for input(s): BNP, PROBNP in the last 168 hours.  DDimer No results for input(s): DDIMER in the last 168 hours.  Radiology/Studies:  Dg Chest 2 View  Result Date: 08/30/2018 CLINICAL DATA:  Chest pain shortness of breath and cough EXAM: CHEST - 2 VIEW COMPARISON:  08/09/2018, 08/01/2018 FINDINGS: Post sternotomy changes. Left-sided pacing device as before. Trace left pleural effusion. Small right-sided pleural effusion. Stable enlarged cardiomediastinal silhouette. No pneumothorax. IMPRESSION: 1. Similar appearance of small right greater than left pleural effusions, cardiomegaly and vascular congestion. Electronically Signed   By: Donavan Foil M.D.   On: 08/30/2018 23:48    Assessment and Plan:   1. Chest pain - troponin 0.05 --> 0.13 - EKG with Afib and LBBB - pt describes intermittent chest pain, radiates to left elbow, associated with nausea, rated as a 7-8/10 - does not want an intervention - states he doesn't want to go through another heart catheterization and I think this is reasonable - home medications: ASA, plavix, lipitor, zetia, coreg, imdur 30 mg daily, and 20 mg lasix BID - he is already on isosorbide 90 mg daily - will add ranexa if he continues to have chest pain   2. PAF, SSS s/p PPM - pacing on telemetry - not a candidate for anticoagulation   3. Chronic systolic and  diastolic heart failure - agree with continuation of home lasix - does not appear volume overloaded   4. Acute on chronic kidney disease stage III - sCr improving to 1.73, K 3.9 - baseline creatinine difficult to determine - was 2.1-2.9 during last two admissions - making urine   5. Palliative care following - goals of care being discussed  For questions or updates, please contact [place] Please consult [email or web address] for contact info under   Signed, Ledora Bottcher, PA  08/31/2018 10:34 AM   Patient seen, examined. Available data reviewed. Agree with findings, assessment, and plan as outlined by Doreene Adas, PA.  The patient is independently interviewed and examined.  His 2 daughters are at the bedside.  On my exam, he is an elderly, frail-appearing male in no distress.  JVP is normal, carotid upstrokes are normal without  bruits, lung fields are clear, heart is irregularly irregular without murmur or gallop, abdomen is soft and nontender, extremities are very thin with no edema.  Skin is warm and dry with no rash.  The patient has multiple severe medical problems in the context of advanced age.  He is having increasing anginal symptoms and I agree that medical therapy is indicated.  He is not a candidate for invasive procedures, nor does he want to have any more invasive procedures performed.  In fact, the patient and his family favor a palliative approach to his care.  They plan to meet with the head of nursing at [facility] when they return there so that they can come up with a plan to deal with his chest pain.  I will add ranexa 500 mg BID to his regimen.  He can use nitroglycerin as needed up to 3 doses sublingual.  If pain persists after nitroglycerin it would be reasonable to use narcotic analgesics such as oxycodone.  Hopefully this will prevent the need for recurrent hospitalization.  Sherren Mocha, M.D. 08/31/2018 4:46 PM

## 2018-08-31 NOTE — H&P (Signed)
History and Physical    John Peck XLK:[id number] DOB: 09-21-1924 DOA: 08/30/2018  PCP: Seward Carol, MD   Patient coming from: SNF   Chief Complaint: Chest pain   HPI: John Peck is a 84 y.o. male with medical history significant for coronary artery disease, chronic systolic CHF, paroxysmal atrial fibrillation not anticoagulated due to history of GI bleed, COPD, and sick sinus syndrome with pacer, now presenting to the emergency department for evaluation of chest pain.  Patient reports that he been in his usual state of health until earlier today when he developed chest pain while at rest.  Pain is described as burning, was severe initially, localized, and without any exacerbating factors identified.  He denies any change in his chronic shortness of breath and denies any significant nausea or diaphoresis.  He was treated with several doses of nitroglycerin at his facility, and again with EMS, and reports that his discomfort nearly resolved with this.  He was admitted to the hospital with chest pain last month, but indicated that he would not likely want to undergo any intervention.  He denies any abdominal pain, nausea, or vomiting, but is unsure if there has been any melena or hematochezia recently.  ED Course: Upon arrival to the ED, patient is found to be afebrile, saturating well on room air, and with vitals otherwise normal.  EKG features atrial fibrillation with chronic LBBB.  Chest x-ray is notable for stable cardiomegaly, vascular congestion, and small pleural effusions.  Chemistry panel is notable for creatinine 1.82, lower than recent priors.  CBC features a hemoglobin of 8.8, down from 11.2 earlier this month.  Troponin is normal.  Patient continues to indicate that he would not want any intervention if his current symptoms were to represent an acute MI, but requests that we observe him in the hospital for management of the acute symptoms.   Review of Systems:  All other  systems reviewed and apart from HPI, are negative.  Past Medical History:  Diagnosis Date  . Anemia   . Arthritis   . CAD (coronary artery disease)    a. CABG '89; b. PCI '04; c. 11/2015 Cath/PCI: LM 20ost, LAD 112m, LCX 30p, OM2 40, RCA 30p, 10p ISR, 90d (3.0x12 Synergy DES), AM 90, VG->OM2 known to be 100, LIMA->LAD not injected, patent in 2015.  . Carotid stenosis, right   . Chronic combined systolic and diastolic CHF (congestive heart failure) ([place])   . CKD (chronic kidney disease), stage III ([place])   . Dyspnea   . H/O: GI bleed    REMOTE HISTORY  . History of COPD   . Hyperlipidemia   . Hypertensive heart disease   . LBBB (left bundle branch block)   . Pacemaker Sept 2009   St Jude  . PAF (paroxysmal atrial fibrillation) ([facility])    a. not on anticoag due to prior GIB.  . Sick sinus syndrome [facility]) Sept 2009   ST Jude PTVDP  . Stroke [facility])     Past Surgical History:  Procedure Laterality Date  . CARDIAC CATHETERIZATION  11/2011   EF 50%; significant native CAD w/80% stenosis of the LAD after 2nd giagonal vessel and septal perforating artery w/total occlusion of the mid left anterior descendiung.; 60-70% ostiasl stenosis in the circumflex vessel followed by 40% proximal stenosis and 70-80% distal circumflex stenosis;   . CARDIAC CATHETERIZATION N/A 11/29/2015   Procedure: Left Heart Cath and Cors/Grafts Angiography;  Surgeon: Burnell Blanks, MD;  Location: [place] [facility]  [facility];  Service: Cardiovascular;  Laterality: N/A;  . CARDIAC CATHETERIZATION N/A 11/29/2015   Procedure: Coronary Stent Intervention;  Surgeon: Burnell Blanks, MD;  Location: [place] [facility];  Service: Cardiovascular;  Laterality: N/A;  . CATARACT EXTRACTION  2004  . CORONARY ANGIOGRAPHY N/A 02/25/2017   Procedure: Coronary Angiography;  Surgeon: Belva Crome, MD;  Location: [place] [facility] LAB;  Service: Cardiovascular;  Laterality: N/A;  . CORONARY ANGIOPLASTY WITH STENT PLACEMENT  2004   RCA  .  CORONARY ARTERY BYPASS GRAFT  1989   had LIMA to his LAD, a vein to the circumflex. In 2004 underwent stenting to his right coronary artery.  . CORONARY STENT INTERVENTION N/A 02/24/2017   Procedure: Coronary Stent Intervention;  Surgeon: Wellington Hampshire, MD;  Location: [place] [facility];  Service: Cardiovascular;  Laterality: N/A;  cfx  . HEMORRHOID SURGERY    . INSERT / REPLACE / REMOVE PACEMAKER  07/17/08   DUAL-CHAMBER; PPM-ST.JUDE MEDNET  . IR THORACENTESIS ASP PLEURAL SPACE W/IMG GUIDE  05/25/2018  . IR THORACENTESIS ASP PLEURAL SPACE W/IMG GUIDE  08/01/2018  . LEFT HEART CATH AND CORS/GRAFTS ANGIOGRAPHY N/A 02/24/2017   Procedure: Left Heart Cath and Cors/Grafts Angiography;  Surgeon: Wellington Hampshire, MD;  Location: [place] [facility];  Service: Cardiovascular;  Laterality: N/A;  . LEFT HEART CATHETERIZATION WITH CORONARY/GRAFT ANGIOGRAM N/A 12/10/2011   Procedure: LEFT HEART CATHETERIZATION WITH Beatrix Fetters;  Surgeon: Troy Sine, MD;  Location: [facility] C[facility] LAB;  Service: Cardiovascular;  Laterality: N/A;  . LEFT HEART CATHETERIZATION WITH CORONARY/GRAFT ANGIOGRAM N/A 01/26/2014   Procedure: LEFT HEART CATHETERIZATION WITH Beatrix Fetters;  Surgeon: Troy Sine, MD;  Location: [facility] CATH LAB;  Service: Cardiovascular;  Laterality: N/A;  . PPM GENERATOR CHANGEOUT N/A 12/15/2017   Procedure: PPM GENERATOR CHANGEOUT;  Surgeon: Evans Lance, MD;  Location: [place] [facility];  Service: Cardiovascular;  Laterality: N/A;  . PROSTATECTOMY       reports that he quit smoking about 20 years ago. His smoking use included cigarettes. He has a 48.75 pack-year smoking history. He quit smokeless tobacco use about 9 years ago. He reports that he does not drink alcohol or use drugs.  No Known Allergies  Family History  Problem Relation Age of Onset  . Diabetes Father   . Stroke Sister   . Heart disease Sister   . Heart disease Brother   . Heart attack Brother   . Stroke Brother    . Hypertension Neg Hx      Prior to Admission medications   Medication Sig Start Date End Date Taking? Authorizing Provider  acetaminophen (TYLENOL) 325 MG tablet Take 650 mg by mouth 3 (three) times daily.   Yes [provider]  albuterol (PROVENTIL HFA;VENTOLIN HFA) 108 (90 Base) MCG/ACT inhaler Inhale 2 puffs into the lungs every 4 (four) hours as needed for wheezing or shortness of breath (or coughing). 05/27/18  Yes Emokpae, Courage, MD  aspirin 81 MG tablet Take 81 mg by mouth daily.    Yes [provider]  atorvastatin (LIPITOR) 40 MG tablet Take 1 tablet (40 mg total) by mouth daily. Patient taking differently: Take 40 mg by mouth every evening.  09/18/15  Yes Troy Sine, MD  carvedilol (COREG) 12.5 MG tablet Take 1 tablet (12.5 mg total) by mouth 2 (two) times daily with a meal. 05/27/18  Yes Emokpae, Courage, MD  clopidogrel (PLAVIX) 75 MG tablet Take 1 tablet (75 mg total) by  mouth daily. 05/27/18  Yes Emokpae, Courage, MD  ezetimibe (ZETIA) 10 MG tablet Take 10 mg by mouth daily.   Yes [provider]  ferrous sulfate 325 (65 FE) MG tablet Take 1 tablet (325 mg total) by mouth 2 (two) times daily with a meal. 05/27/18  Yes Emokpae, Courage, MD  furosemide (LASIX) 20 MG tablet Take 1 tablet (20 mg total) by mouth 2 (two) times daily. 08/12/18  Yes Domenic Polite, MD  ipratropium-albuterol (DUONEB) 0.5-2.5 (3) MG/3ML SOLN Take 3 mLs by nebulization 2 (two) times daily. 02/24/18  Yes Mikhail, Velta Addison, DO  isosorbide mononitrate (IMDUR) 30 MG 24 hr tablet Take 3 tablets (90 mg total) by mouth daily. 05/28/18  Yes Emokpae, Courage, MD  latanoprost (XALATAN) 0.005 % ophthalmic solution Place 1 drop into both eyes 2 (two) times a week. On mondays and wednesdays 11/07/15  Yes [provider]  levothyroxine (SYNTHROID, LEVOTHROID) 125 MCG tablet TAKE 1 TABLET BY MOUTH EVERY DAY BEFORE BREAKFAST Patient taking differently: Take 125 mcg by mouth daily before  breakfast.  04/05/18  Yes Troy Sine, MD  mupirocin ointment (BACTROBAN) 2 % Place 1 application into the nose 2 (two) times daily. To left foot for ingrown toenail   Yes [provider]  nitroGLYCERIN (NITROSTAT) 0.4 MG SL tablet Place 1 tablet (0.4 mg total) under the tongue every 5 (five) minutes as needed for chest pain. 07/06/18  Yes Isaac Bliss, Rayford Halsted, MD  pantoprazole (PROTONIX) 40 MG tablet Take 1 tablet (40 mg total) by mouth daily. 05/28/18  Yes Emokpae, Courage, MD  polyethylene glycol (MIRALAX / GLYCOLAX) packet Take 17 g by mouth daily. 05/27/18  Yes Roxan Hockey, MD  senna (SENOKOT) 8.6 MG tablet Take 2 tablets by mouth 2 (two) times daily as needed for constipation.   Yes [provider]  tamsulosin (FLOMAX) 0.4 MG CAPS capsule Take 1 capsule (0.4 mg total) by mouth daily. 05/27/18  Yes Emokpae, Courage, MD  Amino Acids-Protein Hydrolys (FEEDING SUPPLEMENT, PRO-STAT SUGAR FREE 64,) LIQD Take 30 mLs by mouth 3 (three) times daily with meals. Patient not taking: Reported on 08/09/2018 08/02/18   Edwin Dada, MD  feeding supplement, ENSURE COMPLETE, (ENSURE COMPLETE) LIQD Take 237 mLs by mouth 2 (two) times daily between meals. Patient not taking: Reported on 08/09/2018 08/02/18   Edwin Dada, MD    Physical Exam: Vitals:   08/30/18 2330 08/30/18 2345 08/31/18 0000 08/31/18 0015  BP: 125/67 114/72 130/64 (!) 119/57  Pulse: 65 60 65 62  Resp: 19 16 13 19   Temp:      TempSrc:      SpO2: 100% 98% 100% 100%    Constitutional: NAD, calm  Eyes: PERTLA, lids and conjunctivae normal ENMT: Mucous membranes are moist. Posterior pharynx clear of any exudate or lesions.   Neck: supple, smooth and rounded non-tender nodules, no thyromegaly Respiratory: Breath sounds diminished bilaterally, no wheezing, no crackles. Normal respiratory effort.    Cardiovascular: Rate ~60 and irregular. No extremity edema.   Abdomen: No distension, no  tenderness, soft. Bowel sounds normal.  Musculoskeletal: no clubbing / cyanosis. No joint deformity upper and lower extremities.    Skin: no significant rashes, lesions, ulcers. Warm, dry, well-perfused. Neurologic: No gross facial asymmetry, no dysarthria. Sensation intact. Moving all extremities.  Psychiatric:  Alert and oriented to person, place, and situation. Pleasant, cooperative.    Labs on Admission: I have personally reviewed following labs and imaging studies  CBC: Recent Labs  Lab  08/30/18 2322  WBC 4.7  HGB 8.8*  HCT 28.4*  MCV 101.1*  PLT 622   Basic Metabolic Panel: Recent Labs  Lab 08/30/18 2322  NA 140  K 3.9  CL 108  CO2 23  GLUCOSE 102*  BUN 41*  CREATININE 1.82*  CALCIUM 8.5*   GFR: CrCl cannot be calculated (Unknown ideal weight.). Liver Function Tests: No results for input(s): AST, ALT, ALKPHOS, BILITOT, PROT, ALBUMIN in the last 168 hours. No results for input(s): LIPASE, AMYLASE in the last 168 hours. No results for input(s): AMMONIA in the last 168 hours. Coagulation Profile: No results for input(s): INR, PROTIME in the last 168 hours. Cardiac Enzymes: No results for input(s): CKTOTAL, CKMB, CKMBINDEX, TROPONINI in the last 168 hours. BNP (last 3 results) No results for input(s): PROBNP in the last 8760 hours. HbA1C: No results for input(s): HGBA1C in the last 72 hours. CBG: No results for input(s): GLUCAP in the last 168 hours. Lipid Profile: No results for input(s): CHOL, HDL, LDLCALC, TRIG, CHOLHDL, LDLDIRECT in the last 72 hours. Thyroid Function Tests: No results for input(s): TSH, T4TOTAL, FREET4, T3FREE, THYROIDAB in the last 72 hours. Anemia Panel: No results for input(s): VITAMINB12, FOLATE, FERRITIN, TIBC, IRON, RETICCTPCT in the last 72 hours. Urine analysis:    Component Value Date/Time   COLORURINE AMBER (A) 08/09/2018 1029   APPEARANCEUR HAZY (A) 08/09/2018 1029   LABSPEC 1.016 08/09/2018 1029   PHURINE 5.0 08/09/2018  1029   GLUCOSEU NEGATIVE 08/09/2018 1029   HGBUR NEGATIVE 08/09/2018 1029   BILIRUBINUR NEGATIVE 08/09/2018 1029   KETONESUR 5 (A) 08/09/2018 1029   PROTEINUR NEGATIVE 08/09/2018 1029   UROBILINOGEN 0.2 05/09/2015 1523   NITRITE NEGATIVE 08/09/2018 1029   LEUKOCYTESUR SMALL (A) 08/09/2018 1029   Sepsis Labs: @LABRCNTIP (procalcitonin:4,lacticidven:4) )No results found for this or any previous visit (from the past 240 hour(s)).   Radiological Exams on Admission: Dg Chest 2 View  Result Date: 08/30/2018 CLINICAL DATA:  Chest pain shortness of breath and cough EXAM: CHEST - 2 VIEW COMPARISON:  08/09/2018, 08/01/2018 FINDINGS: Post sternotomy changes. Left-sided pacing device as before. Trace left pleural effusion. Small right-sided pleural effusion. Stable enlarged cardiomediastinal silhouette. No pneumothorax. IMPRESSION: 1. Similar appearance of small right greater than left pleural effusions, cardiomegaly and vascular congestion. Electronically Signed   By: Donavan Foil M.D.   On: 08/30/2018 23:48    EKG: Independently reviewed. Atrial fibrillation, chronic LBBB.   Assessment/Plan   1. Chest pain; CAD  - Presents with chest pain at rest, nearly resolved after several doses of NTG at SNF and with EMS  - He has known CAD, was admitted last month with chest pain and did not want any intervention at that time, cardiology was consulted during that admission and recommended medical mgmt  - Initial troponin is normal and pain has resolved  - Patient continues to state that he does not want any intervention but pt and family want him to be observed in hospital overnight to manage the acute pain and optimize medical therapies  - Continue cardiac monitoring, continue ASA, Plavix, statin, beta-blocker, and nitrates, consider increasing Imdur to 120 mg   2. CKD stage III  - SCr is 1.82 on admission, lower than recent priors  - Renally-dose medications  -   3. Paroxysmal atrial fibrillation    - In rate-controlled a fib on admission  - CHADS-VASc at least 10 (age x2, CAD, CHF)  - Not anticoagulated d/t hx of GIB  - Continue beta-blocker  4. Chronic systolic CHF - Appears compensated  - Continue Lasix and Coreg, follow daily wts    5. Anemia  - Hgb is 8.8 on admission, down from 11.2 three weeks ago  - Has hx of GIB, no active bleeding noted, pt unsure if any melena or hematochezia recently  - Check FOBT, check anemia panel in light of newly elevated MCV, repeat CBC in am   6. COPD  - No wheezing, reports his SOB is chronic and stable  - Continue twice-daily Duoneb and as-needed albuterol    7. Dysphagia   - Continue dysphagia I diet with nectar-thick liquids    8. Hypothyroidism  - Continue Synthroid     DVT prophylaxis: SCD's  Code Status: DNR  Family Communication: Discussed with patient  Consults called: None Admission status: Observation     Vianne Bulls, MD [facility] Pager [phone]  If 7PM-7AM, please contact night-coverage [email or web address] Password [facility]  08/31/2018, 12:43 AM

## 2018-08-31 NOTE — Progress Notes (Signed)
Long discussion w/patient and family.  Patient is adamant that he does not want any further interventions or procedures.  Patient and family are aware that patient will continue to most likely have episodes of chest pain.  Daughters are content w/making a plan to sit down w/ the DON at [facility] and develop a plan for pain management if other episodes persist, without continued trips to the hospital.  Patient is a DNR and desires to remain a DNR.  Patient has had no c/o CP today; however, urine has been sent for potential UTI and patient has an outpatient visit to see podiatrist on Monday for in-grown toenail that is causing him discomfort.  Plan expected to return to [facility] as early as tomorrow if medically stable.

## 2018-09-01 DIAGNOSIS — N183 Chronic kidney disease, stage 3 (moderate): Secondary | ICD-10-CM

## 2018-09-01 DIAGNOSIS — Z7401 Bed confinement status: Secondary | ICD-10-CM | POA: Diagnosis not present

## 2018-09-01 DIAGNOSIS — I480 Paroxysmal atrial fibrillation: Secondary | ICD-10-CM | POA: Diagnosis not present

## 2018-09-01 DIAGNOSIS — E785 Hyperlipidemia, unspecified: Secondary | ICD-10-CM | POA: Diagnosis not present

## 2018-09-01 DIAGNOSIS — R079 Chest pain, unspecified: Secondary | ICD-10-CM | POA: Diagnosis not present

## 2018-09-01 DIAGNOSIS — R0789 Other chest pain: Secondary | ICD-10-CM | POA: Diagnosis not present

## 2018-09-01 DIAGNOSIS — M6281 Muscle weakness (generalized): Secondary | ICD-10-CM | POA: Diagnosis not present

## 2018-09-01 DIAGNOSIS — E039 Hypothyroidism, unspecified: Secondary | ICD-10-CM

## 2018-09-01 DIAGNOSIS — Z66 Do not resuscitate: Secondary | ICD-10-CM | POA: Diagnosis not present

## 2018-09-01 DIAGNOSIS — I447 Left bundle-branch block, unspecified: Secondary | ICD-10-CM | POA: Diagnosis not present

## 2018-09-01 DIAGNOSIS — J90 Pleural effusion, not elsewhere classified: Secondary | ICD-10-CM | POA: Diagnosis not present

## 2018-09-01 DIAGNOSIS — I2510 Atherosclerotic heart disease of native coronary artery without angina pectoris: Secondary | ICD-10-CM | POA: Diagnosis not present

## 2018-09-01 DIAGNOSIS — I5043 Acute on chronic combined systolic (congestive) and diastolic (congestive) heart failure: Secondary | ICD-10-CM | POA: Diagnosis not present

## 2018-09-01 DIAGNOSIS — R278 Other lack of coordination: Secondary | ICD-10-CM | POA: Diagnosis not present

## 2018-09-01 DIAGNOSIS — R319 Hematuria, unspecified: Secondary | ICD-10-CM | POA: Diagnosis not present

## 2018-09-01 DIAGNOSIS — R41841 Cognitive communication deficit: Secondary | ICD-10-CM | POA: Diagnosis not present

## 2018-09-01 DIAGNOSIS — R2681 Unsteadiness on feet: Secondary | ICD-10-CM | POA: Diagnosis not present

## 2018-09-01 DIAGNOSIS — R1312 Dysphagia, oropharyngeal phase: Secondary | ICD-10-CM | POA: Diagnosis not present

## 2018-09-01 DIAGNOSIS — I130 Hypertensive heart and chronic kidney disease with heart failure and stage 1 through stage 4 chronic kidney disease, or unspecified chronic kidney disease: Secondary | ICD-10-CM | POA: Diagnosis not present

## 2018-09-01 DIAGNOSIS — J449 Chronic obstructive pulmonary disease, unspecified: Secondary | ICD-10-CM | POA: Diagnosis not present

## 2018-09-01 DIAGNOSIS — I5042 Chronic combined systolic (congestive) and diastolic (congestive) heart failure: Secondary | ICD-10-CM | POA: Diagnosis not present

## 2018-09-01 DIAGNOSIS — I10 Essential (primary) hypertension: Secondary | ICD-10-CM | POA: Diagnosis not present

## 2018-09-01 DIAGNOSIS — I5022 Chronic systolic (congestive) heart failure: Secondary | ICD-10-CM | POA: Diagnosis not present

## 2018-09-01 DIAGNOSIS — R2689 Other abnormalities of gait and mobility: Secondary | ICD-10-CM | POA: Diagnosis not present

## 2018-09-01 DIAGNOSIS — M2550 Pain in unspecified joint: Secondary | ICD-10-CM | POA: Diagnosis not present

## 2018-09-01 DIAGNOSIS — R293 Abnormal posture: Secondary | ICD-10-CM | POA: Diagnosis not present

## 2018-09-01 LAB — CUP PACEART REMOTE DEVICE CHECK
Battery Remaining Longevity: 115 mo
Battery Remaining Percentage: 95.5 %
Battery Voltage: 3.01 V
Brady Statistic AS VS Percent: 12 %
Brady Statistic RA Percent Paced: 21 %
Date Time Interrogation Session: 20190924192050
Implantable Lead Implant Date: 20090908
Implantable Lead Implant Date: 20090908
Implantable Lead Location: 753859
Implantable Pulse Generator Implant Date: 20190206
Lead Channel Impedance Value: 380 Ohm
Lead Channel Pacing Threshold Amplitude: 1.25 V
Lead Channel Pacing Threshold Pulse Width: 0.4 ms
Lead Channel Sensing Intrinsic Amplitude: 4.1 mV
Lead Channel Setting Pacing Amplitude: 2.5 V
Lead Channel Setting Pacing Pulse Width: 0.4 ms
MDC IDC LEAD LOCATION: 753860
MDC IDC MSMT LEADCHNL RA IMPEDANCE VALUE: 350 Ohm
MDC IDC MSMT LEADCHNL RA PACING THRESHOLD AMPLITUDE: 0.5 V
MDC IDC MSMT LEADCHNL RA PACING THRESHOLD PULSEWIDTH: 0.4 ms
MDC IDC MSMT LEADCHNL RA SENSING INTR AMPL: 2.8 mV
MDC IDC SET LEADCHNL RV PACING AMPLITUDE: 1.5 V
MDC IDC SET LEADCHNL RV SENSING SENSITIVITY: 0.5 mV
MDC IDC STAT BRADY AP VP PERCENT: 43 %
MDC IDC STAT BRADY AP VS PERCENT: 42 %
MDC IDC STAT BRADY AS VP PERCENT: 3.6 %
MDC IDC STAT BRADY RV PERCENT PACED: 84 %
Pulse Gen Serial Number: 8995118

## 2018-09-01 LAB — BASIC METABOLIC PANEL
Anion gap: 6 (ref 5–15)
BUN: 41 mg/dL — ABNORMAL HIGH (ref 8–23)
CALCIUM: 8.5 mg/dL — AB (ref 8.9–10.3)
CO2: 24 mmol/L (ref 22–32)
Chloride: 112 mmol/L — ABNORMAL HIGH (ref 98–111)
Creatinine, Ser: 1.82 mg/dL — ABNORMAL HIGH (ref 0.61–1.24)
GFR calc Af Amer: 35 mL/min — ABNORMAL LOW (ref >60)
GFR calc non Af Amer: 31 mL/min — ABNORMAL LOW (ref >60)
Glucose, Bld: 98 mg/dL (ref 70–99)
Potassium: 4.1 mmol/L (ref 3.5–5.1)
Sodium: 142 mmol/L (ref 135–145)

## 2018-09-01 LAB — CBC WITH DIFFERENTIAL/PLATELET
Abs Immature Granulocytes: 0.02 10*3/uL (ref 0.00–0.07)
BASOS ABS: 0 10*3/uL (ref 0.0–0.1)
Basophils Relative: 0 %
EOS ABS: 0.2 10*3/uL (ref 0.0–0.5)
Eosinophils Relative: 4 %
HEMATOCRIT: 28.3 % — AB (ref 39.0–52.0)
HEMOGLOBIN: 8.9 g/dL — AB (ref 13.0–17.0)
IMMATURE GRANULOCYTES: 1 %
LYMPHS ABS: 0.8 10*3/uL (ref 0.7–4.0)
LYMPHS PCT: 20 %
MCH: 31.1 pg (ref 26.0–34.0)
MCHC: 31.4 g/dL (ref 30.0–36.0)
MCV: 99 fL (ref 80.0–100.0)
Monocytes Absolute: 0.6 10*3/uL (ref 0.1–1.0)
Monocytes Relative: 15 %
NEUTROS PCT: 60 %
NRBC: 0 % (ref 0.0–0.2)
Neutro Abs: 2.6 10*3/uL (ref 1.7–7.7)
Platelets: 161 10*3/uL (ref 150–400)
RBC: 2.86 MIL/uL — ABNORMAL LOW (ref 4.22–5.81)
RDW: 14.2 % (ref 11.5–15.5)
WBC: 4.3 10*3/uL (ref 4.0–10.5)

## 2018-09-01 LAB — MAGNESIUM: Magnesium: 2.2 mg/dL (ref 1.7–2.4)

## 2018-09-01 MED ORDER — ATORVASTATIN CALCIUM 40 MG PO TABS: 40.0000 mg | ORAL_TABLET | Freq: Every evening | ORAL | Status: DC

## 2018-09-01 MED ORDER — NITROGLYCERIN 0.4 MG SL SUBL: SUBLINGUAL_TABLET | SUBLINGUAL | 12 refills | Status: AC

## 2018-09-01 MED ORDER — RANOLAZINE ER 500 MG PO TB12: 500.0000 mg | ORAL_TABLET | Freq: Two times a day (BID) | ORAL | Status: DC

## 2018-09-01 NOTE — Discharge Summary (Signed)
Physician Discharge Summary  John Peck YIR:[id number] DOB: [birth year] DOA: 08/30/2018  PCP: Seward Carol, MD  Admit date: 08/30/2018 Discharge date: 09/01/2018  Admitted From: SNF Disposition: SNF  Recommendations for Outpatient Follow-up:  Follow up with SNF provider at earliest convenience Follow-up with cardiology as an outpatient next and follow-up with palliative care as an outpatient  Home Health: No Equipment/Devices: None  Discharge Condition: Guarded CODE STATUS: DNR Diet recommendation: Heart Healthy/dysphagia 1 diet with nectar thick liquids Brief/Interim Summary: 84 year old male with history of coronary artery disease, chronic systolic CHF, paroxysmal atrial fibrillation not on anticoagulation due to history of GI bleed, COPD, sick sinus syndrome status post pacemaker presented for evaluation of chest pain.  Cardiology was consulted.  Troponin was minimally elevated.  Patient was managed medically as patient did not want any further interventions.  Palliative care consult is pending.  This can happen as an outpatient in the nursing home.  Patient will be discharged to nursing home with outpatient follow-up.  Discharge Diagnoses:  Principal Problem:   Chest pain Active Problems:   PAF (paroxysmal atrial fibrillation) (HCC)   CAD in native artery   Sick sinus syndrome (HCC)   Chronic systolic CHF (congestive heart failure) (HCC)   COPD clinical dx/ no pfts on record   CKD (chronic kidney disease), stage III (HCC)   Macrocytic anemia   Hypothyroidism   Dysphagia  Chest pain with minimally elevated troponins in a patient with history of coronary artery disease -Stable.  Patient was evaluated by cardiology and Ranexa was added to the regimen.  Continue aspirin, Coreg, Plavix, Imdur.  Patient does not want any further cardiac interventions.  Patient can be given nitroglycerin as needed up to 3 doses sublingual and if pain persists after nitroglycerin, patient can  be given pain medications.  Palliative care consult was requested which is pending.  This can happen in the nursing home.  Patient will be discharged to nursing home today  Chronic kidney disease stage III -Creatinine stable.  Outpatient follow-up  Paroxysmal atrial fibrillation -Rate controlled.  Not on anticoagulation due to history of GI bleeding.  Continue Coreg  Chronic systolic CHF -Compensated.  Continue Lasix and Coreg and Imdur.  Outpatient follow-up with cardiology  Anemia -Questionable cause.  Hemoglobin stable.  No signs of melena or hematochezia.  Outpatient follow-up  COPD -Stable.  Outpatient follow-up  Dysphagia -Continue dysphagia 1 diet with nectar thick liquids  Hypothyroidism -Continue Synthroid   Discharge Instructions  Discharge Instructions    Ambulatory referral to Cardiology   Complete by:  As directed    Diet - low sodium heart healthy   Complete by:  As directed    Increase activity slowly   Complete by:  As directed      Allergies as of 09/01/2018   No Known Allergies     Medication List    STOP taking these medications   feeding supplement (ENSURE COMPLETE) Liqd   feeding supplement (PRO-STAT SUGAR FREE 64) Liqd     TAKE these medications   acetaminophen 325 MG tablet Commonly known as:  TYLENOL Take 650 mg by mouth 3 (three) times daily.   albuterol 108 (90 Base) MCG/ACT inhaler Commonly known as:  PROVENTIL HFA;VENTOLIN HFA Inhale 2 puffs into the lungs every 4 (four) hours as needed for wheezing or shortness of breath (or coughing).   aspirin 81 MG tablet Take 81 mg by mouth daily.   atorvastatin 40 MG tablet Commonly known as:  LIPITOR Take 1 tablet (  40 mg total) by mouth every evening.   carvedilol 12.5 MG tablet Commonly known as:  COREG Take 1 tablet (12.5 mg total) by mouth 2 (two) times daily with a meal.   clopidogrel 75 MG tablet Commonly known as:  PLAVIX Take 1 tablet (75 mg total) by mouth daily.    ezetimibe 10 MG tablet Commonly known as:  ZETIA Take 10 mg by mouth daily.   ferrous sulfate 325 (65 FE) MG tablet Take 1 tablet (325 mg total) by mouth 2 (two) times daily with a meal.   furosemide 20 MG tablet Commonly known as:  LASIX Take 1 tablet (20 mg total) by mouth 2 (two) times daily.   ipratropium-albuterol 0.5-2.5 (3) MG/3ML Soln Commonly known as:  DUONEB Take 3 mLs by nebulization 2 (two) times daily.   isosorbide mononitrate 30 MG 24 hr tablet Commonly known as:  IMDUR Take 3 tablets (90 mg total) by mouth daily.   latanoprost 0.005 % ophthalmic solution Commonly known as:  XALATAN Place 1 drop into both eyes 2 (two) times a week. On mondays and wednesdays   levothyroxine 125 MCG tablet Commonly known as:  SYNTHROID, LEVOTHROID TAKE 1 TABLET BY MOUTH EVERY DAY BEFORE BREAKFAST What changed:  See the new instructions.   mupirocin ointment 2 % Commonly known as:  BACTROBAN Place 1 application into the nose 2 (two) times daily. To left foot for ingrown toenail   nitroGLYCERIN 0.4 MG SL tablet Commonly known as:  NITROSTAT Every 5 min x3 PRN chest pain What changed:    how much to take  how to take this  when to take this  reasons to take this  additional instructions   pantoprazole 40 MG tablet Commonly known as:  PROTONIX Take 1 tablet (40 mg total) by mouth daily.   polyethylene glycol packet Commonly known as:  MIRALAX / GLYCOLAX Take 17 g by mouth daily.   ranolazine 500 MG 12 hr tablet Commonly known as:  RANEXA Take 1 tablet (500 mg total) by mouth 2 (two) times daily.   senna 8.6 MG tablet Commonly known as:  SENOKOT Take 2 tablets by mouth 2 (two) times daily as needed for constipation.   tamsulosin 0.4 MG Caps capsule Commonly known as:  FLOMAX Take 1 capsule (0.4 mg total) by mouth daily.       Contact information for follow-up providers    Palliative care. Schedule an appointment as soon as possible for a visit in 1  week(s).            Contact information for after-discharge care    Destination    [facility] .   Service:  Skilled Nursing Contact information: [facility] [place] [phone]                 No Known Allergies  Consultations:  Cardiology   Procedures/Studies: Dg Chest 2 View  Result Date: 08/30/2018 CLINICAL DATA:  Chest pain shortness of breath and cough EXAM: CHEST - 2 VIEW COMPARISON:  08/09/2018, 08/01/2018 FINDINGS: Post sternotomy changes. Left-sided pacing device as before. Trace left pleural effusion. Small right-sided pleural effusion. Stable enlarged cardiomediastinal silhouette. No pneumothorax. IMPRESSION: 1. Similar appearance of small right greater than left pleural effusions, cardiomegaly and vascular congestion. Electronically Signed   By: Donavan Foil M.D.   On: 08/30/2018 23:48   Ct Head Wo Contrast  Result Date: 08/09/2018 CLINICAL DATA:  Altered level of consciousness. Confusion. Nonverbal. EXAM: CT HEAD  WITHOUT CONTRAST TECHNIQUE: Contiguous axial images were obtained from the base of the skull through the vertex without intravenous contrast. COMPARISON:  06/29/2018 FINDINGS: Brain: Within limitations of artifact created by the patient's right hand being placed on the head, there is no evidence of acute infarct, intracranial hemorrhage, mass, midline shift, or extra-axial fluid collection. Mild cerebral atrophy is within normal limits for age. Periventricular white matter hypodensities were more conspicuous on the prior study, nonspecific but likely reflecting mild chronic small vessel ischemic disease. Vascular: Calcified atherosclerosis at the skull base. No hyperdense vessel. Skull: No fracture or focal osseous lesion. Sinuses/Orbits: Mild right ethmoid air cell mucosal thickening. Visualized mastoid air cells are clear. Prior right cataract extraction. Other: Partially visualized 1.7 cm low-density  subcutaneous nodule posterior to the right parotid gland, grossly unchanged in size from a 02/23/2018 neck CT and likely reflecting a sebaceous cyst or similar. IMPRESSION: No evidence of acute intracranial abnormality. Electronically Signed   By: Logan Bores M.D.   On: 08/09/2018 12:20   Dg Chest Port 1 View  Result Date: 08/09/2018 CLINICAL DATA:  Altered awareness. EXAM: PORTABLE CHEST 1 VIEW COMPARISON:  08/01/2018. FINDINGS: Cardiac pacer with lead tips in right atrium right ventricle. Prior CABG. Cardiomegaly with mild bilateral interstitial prominence and prominent right pleural effusion. Tiny left pleural effusion cannot be excluded. Findings suggest CHF. Findings have worsened from prior exam. Pneumonitis cannot be excluded. No pneumothorax. IMPRESSION: 1. Cardiac pacer with lead tips over the right atrium right ventricle. Prior CABG. Cardiomegaly with diffuse mild bilateral interstitial prominence and prominent right pleural effusion. Small left pleural effusion. Findings suggest CHF. Findings have progressed from prior exam. Electronically Signed   By: [place]   On: 08/09/2018 10:59     Subjective: Patient seen and examined at bedside.  He is a poor historian and hard of hearing but denies any current chest pain.  No overnight fever or vomiting.  Discharge Exam: Vitals:   09/01/18 0722 09/01/18 0835  BP: (!) 159/70   Pulse: (!) 58 60  Resp: (!) 25 18  Temp: 98.2 F (36.8 C)   SpO2: 98% 100%   Vitals:   08/31/18 2318 09/01/18 0329 09/01/18 0722 09/01/18 0835  BP: (!) 157/70 (!) 168/79 (!) 159/70   Pulse: 60 60 (!) 58 60  Resp: (!) 23 17 (!) 25 18  Temp: 98.3 F (36.8 C) 98.1 F (36.7 C) 98.2 F (36.8 C)   TempSrc: Oral Oral Oral   SpO2: 97% 100% 98% 100%  Weight:   58.8 kg   Height:        General: Pt is elderly male lying in bed, no distress.  Hard of hearing.  Poor historian Cardiovascular: rate controlled, S1/S2 + Respiratory: bilateral decreased breath  sounds at bases Abdominal: Soft, NT, ND, bowel sounds + Extremities: no edema, no cyanosis    The results of significant diagnostics from this hospitalization (including imaging, microbiology, ancillary and laboratory) are listed below for reference.     Microbiology: Recent Results (from the past 240 hour(s))  MRSA PCR Screening     Status: None   Collection Time: 08/31/18  1:19 AM  Result Value Ref Range Status   MRSA by PCR NEGATIVE NEGATIVE Final    Comment:        The GeneXpert MRSA Assay (FDA approved for NASAL specimens only), is one component of a comprehensive MRSA colonization surveillance program. It is not intended to diagnose MRSA infection nor to guide or monitor treatment for  MRSA infections. Performed at [facility], [place] [address], [place], [place] [address]      Labs: BNP (last 3 results) Recent Labs    06/02/18 1628 07/29/18 1119 08/09/18 1629  BNP 627.9* 360.4* 9,390.3*   Basic Metabolic Panel: Recent Labs  Lab 08/30/18 2322 08/31/18 0454 09/01/18 0238  NA 140 141 142  K 3.9 3.9 4.1  CL 108 110 112*  CO2 23 24 24   GLUCOSE 102* 89 98  BUN 41* 38* 41*  CREATININE 1.82* 1.73* 1.82*  CALCIUM 8.5* 8.6* 8.5*  MG  --   --  2.2   Liver Function Tests: No results for input(s): AST, ALT, ALKPHOS, BILITOT, PROT, ALBUMIN in the last 168 hours. No results for input(s): LIPASE, AMYLASE in the last 168 hours. No results for input(s): AMMONIA in the last 168 hours. CBC: Recent Labs  Lab 08/30/18 2322 08/31/18 0454 09/01/18 0238  WBC 4.7 4.2 4.3  NEUTROABS  --   --  2.6  HGB 8.8* 8.5* 8.9*  HCT 28.4* 26.1* 28.3*  MCV 101.1* 97.8 99.0  PLT 154 159 161   Cardiac Enzymes: Recent Labs  Lab 08/31/18 0454 08/31/18 0944  TROPONINI 0.13* 0.66*   BNP: Invalid input(s): POCBNP CBG: No results for input(s): GLUCAP in the last 168 hours. D-Dimer No results for input(s): DDIMER in the last 72 hours. Hgb A1c No results for input(s):  HGBA1C in the last 72 hours. Lipid Profile No results for input(s): CHOL, HDL, LDLCALC, TRIG, CHOLHDL, LDLDIRECT in the last 72 hours. Thyroid function studies No results for input(s): TSH, T4TOTAL, T3FREE, THYROIDAB in the last 72 hours.  Invalid input(s): FREET3 Anemia work up Recent Labs    08/31/18 0454  VITAMINB12 443  FOLATE 8.4  FERRITIN 494*  TIBC 158*  IRON 29*  RETICCTPCT 2.0   Urinalysis    Component Value Date/Time   COLORURINE YELLOW 08/31/2018 [place] 08/31/2018 1608   LABSPEC 1.018 08/31/2018 1608   PHURINE 5.0 08/31/2018 1608   GLUCOSEU NEGATIVE 08/31/2018 1608   HGBUR NEGATIVE 08/31/2018 1608   BILIRUBINUR NEGATIVE 08/31/2018 1608   KETONESUR NEGATIVE 08/31/2018 1608   PROTEINUR NEGATIVE 08/31/2018 1608   UROBILINOGEN 0.2 05/09/2015 1523   NITRITE NEGATIVE 08/31/2018 1608   LEUKOCYTESUR NEGATIVE 08/31/2018 1608   Sepsis Labs Invalid input(s): PROCALCITONIN,  WBC,  LACTICIDVEN Microbiology Recent Results (from the past 240 hour(s))  MRSA PCR Screening     Status: None   Collection Time: 08/31/18  1:19 AM  Result Value Ref Range Status   MRSA by PCR NEGATIVE NEGATIVE Final    Comment:        The GeneXpert MRSA Assay (FDA approved for NASAL specimens only), is one component of a comprehensive MRSA colonization surveillance program. It is not intended to diagnose MRSA infection nor to guide or monitor treatment for MRSA infections. Performed at [facility], [place] [address] [place], [place] [address]      Time coordinating discharge: 35 minutes  SIGNED:   Aline August, MD  [facility] 09/01/2018, 9:33 AM Pager: [phone]  If 7PM-7AM, please contact night-coverage [email or web address] Password [email or web address]

## 2018-09-01 NOTE — Progress Notes (Signed)
CSW notified of discharge order and patient readiness to return to [facility].  Will await CSW coordination w/facility to call report.

## 2018-09-01 NOTE — Progress Notes (Signed)
Clinical Social Worker facilitated patient discharge including contacting patient family and facility to confirm patient discharge plans.  Clinical information faxed to facility and family agreeable with plan.  CSW arranged ambulance transport via [facility] to [facility] .  RN to call [phone] report prior to discharge.  Clinical Social Worker will sign off for now as social work intervention is no longer needed. Please consult [place] again if new need arises.  Rhea Pink, MSW, [place]

## 2018-09-02 DIAGNOSIS — N183 Chronic kidney disease, stage 3 (moderate): Secondary | ICD-10-CM | POA: Diagnosis not present

## 2018-09-02 DIAGNOSIS — J449 Chronic obstructive pulmonary disease, unspecified: Secondary | ICD-10-CM | POA: Diagnosis not present

## 2018-09-02 DIAGNOSIS — L600 Ingrowing nail: Secondary | ICD-10-CM | POA: Diagnosis not present

## 2018-09-02 DIAGNOSIS — R079 Chest pain, unspecified: Secondary | ICD-10-CM | POA: Diagnosis not present

## 2018-09-05 DIAGNOSIS — M79671 Pain in right foot: Secondary | ICD-10-CM | POA: Diagnosis not present

## 2018-09-05 DIAGNOSIS — S91109A Unspecified open wound of unspecified toe(s) without damage to nail, initial encounter: Secondary | ICD-10-CM | POA: Diagnosis not present

## 2018-09-05 DIAGNOSIS — M79672 Pain in left foot: Secondary | ICD-10-CM | POA: Diagnosis not present

## 2018-09-05 DIAGNOSIS — L03032 Cellulitis of left toe: Secondary | ICD-10-CM | POA: Diagnosis not present

## 2018-09-07 DIAGNOSIS — R69 Illness, unspecified: Secondary | ICD-10-CM | POA: Diagnosis not present

## 2018-09-07 DIAGNOSIS — Z09 Encounter for follow-up examination after completed treatment for conditions other than malignant neoplasm: Secondary | ICD-10-CM | POA: Diagnosis not present

## 2018-09-07 DIAGNOSIS — G4700 Insomnia, unspecified: Secondary | ICD-10-CM | POA: Diagnosis not present

## 2018-09-07 DIAGNOSIS — R300 Dysuria: Secondary | ICD-10-CM | POA: Diagnosis not present

## 2018-09-07 DIAGNOSIS — L600 Ingrowing nail: Secondary | ICD-10-CM | POA: Diagnosis not present

## 2018-09-07 DIAGNOSIS — J90 Pleural effusion, not elsewhere classified: Secondary | ICD-10-CM | POA: Diagnosis not present

## 2018-09-13 DIAGNOSIS — N3946 Mixed incontinence: Secondary | ICD-10-CM | POA: Diagnosis not present

## 2018-09-13 DIAGNOSIS — R300 Dysuria: Secondary | ICD-10-CM | POA: Diagnosis not present

## 2018-09-13 DIAGNOSIS — N35011 Post-traumatic bulbous urethral stricture: Secondary | ICD-10-CM | POA: Diagnosis not present

## 2018-09-15 DIAGNOSIS — K649 Unspecified hemorrhoids: Secondary | ICD-10-CM | POA: Diagnosis not present

## 2018-09-16 ENCOUNTER — Other Ambulatory Visit (HOSPITAL_COMMUNITY): Payer: Self-pay | Admitting: Internal Medicine

## 2018-09-16 DIAGNOSIS — R1310 Dysphagia, unspecified: Secondary | ICD-10-CM

## 2018-09-20 ENCOUNTER — Ambulatory Visit (HOSPITAL_COMMUNITY)
Admission: RE | Admit: 2018-09-20 | Discharge: 2018-09-20 | Disposition: A | Discharge status: Home / Self Care | Payer: Medicare HMO | Source: Ambulatory Visit | Attending: Internal Medicine | Admitting: Internal Medicine

## 2018-09-20 DIAGNOSIS — N183 Chronic kidney disease, stage 3 (moderate): Secondary | ICD-10-CM | POA: Insufficient documentation

## 2018-09-20 DIAGNOSIS — Z951 Presence of aortocoronary bypass graft: Secondary | ICD-10-CM | POA: Diagnosis not present

## 2018-09-20 DIAGNOSIS — R1311 Dysphagia, oral phase: Secondary | ICD-10-CM | POA: Insufficient documentation

## 2018-09-20 DIAGNOSIS — J449 Chronic obstructive pulmonary disease, unspecified: Secondary | ICD-10-CM | POA: Insufficient documentation

## 2018-09-20 DIAGNOSIS — Z955 Presence of coronary angioplasty implant and graft: Secondary | ICD-10-CM | POA: Insufficient documentation

## 2018-09-20 DIAGNOSIS — I480 Paroxysmal atrial fibrillation: Secondary | ICD-10-CM | POA: Insufficient documentation

## 2018-09-20 DIAGNOSIS — R1310 Dysphagia, unspecified: Secondary | ICD-10-CM

## 2018-09-20 DIAGNOSIS — Z8673 Personal history of transient ischemic attack (TIA), and cerebral infarction without residual deficits: Secondary | ICD-10-CM | POA: Insufficient documentation

## 2018-09-20 DIAGNOSIS — E785 Hyperlipidemia, unspecified: Secondary | ICD-10-CM | POA: Diagnosis not present

## 2018-09-20 DIAGNOSIS — I5042 Chronic combined systolic (congestive) and diastolic (congestive) heart failure: Secondary | ICD-10-CM | POA: Insufficient documentation

## 2018-09-20 DIAGNOSIS — I130 Hypertensive heart and chronic kidney disease with heart failure and stage 1 through stage 4 chronic kidney disease, or unspecified chronic kidney disease: Secondary | ICD-10-CM | POA: Diagnosis not present

## 2018-09-20 DIAGNOSIS — Z950 Presence of cardiac pacemaker: Secondary | ICD-10-CM | POA: Diagnosis not present

## 2018-09-21 DIAGNOSIS — G4700 Insomnia, unspecified: Secondary | ICD-10-CM | POA: Diagnosis not present

## 2018-09-22 DIAGNOSIS — L97521 Non-pressure chronic ulcer of other part of left foot limited to breakdown of skin: Secondary | ICD-10-CM | POA: Diagnosis not present

## 2018-10-03 DIAGNOSIS — I4891 Unspecified atrial fibrillation: Secondary | ICD-10-CM | POA: Diagnosis not present

## 2018-10-03 DIAGNOSIS — N189 Chronic kidney disease, unspecified: Secondary | ICD-10-CM | POA: Diagnosis not present

## 2018-10-03 DIAGNOSIS — D649 Anemia, unspecified: Secondary | ICD-10-CM | POA: Diagnosis not present

## 2018-10-03 DIAGNOSIS — J449 Chronic obstructive pulmonary disease, unspecified: Secondary | ICD-10-CM | POA: Diagnosis not present

## 2018-10-05 DIAGNOSIS — D2330 Other benign neoplasm of skin of unspecified part of face: Secondary | ICD-10-CM | POA: Diagnosis not present

## 2018-10-19 DIAGNOSIS — N35011 Post-traumatic bulbous urethral stricture: Secondary | ICD-10-CM | POA: Diagnosis not present

## 2018-10-19 DIAGNOSIS — R300 Dysuria: Secondary | ICD-10-CM | POA: Diagnosis not present

## 2018-10-19 DIAGNOSIS — N3946 Mixed incontinence: Secondary | ICD-10-CM | POA: Diagnosis not present

## 2018-10-19 DIAGNOSIS — D49511 Neoplasm of unspecified behavior of right kidney: Secondary | ICD-10-CM | POA: Diagnosis not present

## 2018-10-20 ENCOUNTER — Other Ambulatory Visit: Payer: Self-pay | Admitting: Urology

## 2018-10-20 DIAGNOSIS — N401 Enlarged prostate with lower urinary tract symptoms: Secondary | ICD-10-CM | POA: Diagnosis not present

## 2018-10-20 DIAGNOSIS — R300 Dysuria: Secondary | ICD-10-CM | POA: Diagnosis not present

## 2018-10-27 DIAGNOSIS — L720 Epidermal cyst: Secondary | ICD-10-CM | POA: Diagnosis not present

## 2018-10-27 DIAGNOSIS — L218 Other seborrheic dermatitis: Secondary | ICD-10-CM | POA: Diagnosis not present

## 2018-10-27 DIAGNOSIS — N390 Urinary tract infection, site not specified: Secondary | ICD-10-CM | POA: Diagnosis not present

## 2018-10-28 ENCOUNTER — Other Ambulatory Visit (HOSPITAL_COMMUNITY): Payer: Self-pay | Admitting: Urology

## 2018-10-28 ENCOUNTER — Other Ambulatory Visit: Payer: Self-pay | Admitting: Urology

## 2018-10-28 DIAGNOSIS — D49511 Neoplasm of unspecified behavior of right kidney: Secondary | ICD-10-CM

## 2018-10-28 DIAGNOSIS — R1030 Lower abdominal pain, unspecified: Secondary | ICD-10-CM | POA: Diagnosis not present

## 2018-11-01 ENCOUNTER — Ambulatory Visit (INDEPENDENT_AMBULATORY_CARE_PROVIDER_SITE_OTHER): Payer: Medicare HMO

## 2018-11-01 DIAGNOSIS — I495 Sick sinus syndrome: Secondary | ICD-10-CM

## 2018-11-02 LAB — CUP PACEART REMOTE DEVICE CHECK
Battery Remaining Longevity: 116 mo
Battery Remaining Percentage: 95.5 %
Battery Voltage: 3.01 V
Brady Statistic AP VP Percent: 42 %
Brady Statistic AS VP Percent: 3.7 %
Brady Statistic AS VS Percent: 9.7 %
Brady Statistic RA Percent Paced: 11 %
Brady Statistic RV Percent Paced: 90 %
Implantable Lead Implant Date: 20090908
Implantable Lead Location: 753860
Implantable Pulse Generator Implant Date: 20190206
Lead Channel Impedance Value: 350 Ohm
Lead Channel Pacing Threshold Amplitude: 0.5 V
Lead Channel Pacing Threshold Pulse Width: 0.4 ms
Lead Channel Sensing Intrinsic Amplitude: 2.2 mV
Lead Channel Setting Pacing Amplitude: 2.5 V
Lead Channel Setting Sensing Sensitivity: 0.5 mV
MDC IDC LEAD IMPLANT DT: 20090908
MDC IDC LEAD LOCATION: 753859
MDC IDC MSMT LEADCHNL RV IMPEDANCE VALUE: 380 Ohm
MDC IDC MSMT LEADCHNL RV PACING THRESHOLD AMPLITUDE: 0.875 V
MDC IDC MSMT LEADCHNL RV PACING THRESHOLD PULSEWIDTH: 0.4 ms
MDC IDC MSMT LEADCHNL RV SENSING INTR AMPL: 5.6 mV
MDC IDC PG SERIAL: 8995118
MDC IDC SESS DTM: 20191223070013
MDC IDC SET LEADCHNL RV PACING AMPLITUDE: 1.125
MDC IDC SET LEADCHNL RV PACING PULSEWIDTH: 0.4 ms
MDC IDC STAT BRADY AP VS PERCENT: 44 %

## 2018-11-03 NOTE — Progress Notes (Signed)
Remote pacemaker transmission.   

## 2018-11-04 ENCOUNTER — Ambulatory Visit (HOSPITAL_COMMUNITY)
Admission: RE | Admit: 2018-11-04 | Discharge: 2018-11-04 | Disposition: A | Discharge status: Home / Self Care | Payer: Medicare HMO | Source: Ambulatory Visit | Attending: Urology | Admitting: Urology

## 2018-11-04 DIAGNOSIS — M79672 Pain in left foot: Secondary | ICD-10-CM | POA: Diagnosis not present

## 2018-11-04 DIAGNOSIS — C641 Malignant neoplasm of right kidney, except renal pelvis: Secondary | ICD-10-CM | POA: Diagnosis not present

## 2018-11-04 DIAGNOSIS — I739 Peripheral vascular disease, unspecified: Secondary | ICD-10-CM | POA: Diagnosis not present

## 2018-11-04 DIAGNOSIS — N281 Cyst of kidney, acquired: Secondary | ICD-10-CM | POA: Insufficient documentation

## 2018-11-04 DIAGNOSIS — M792 Neuralgia and neuritis, unspecified: Secondary | ICD-10-CM | POA: Diagnosis not present

## 2018-11-04 DIAGNOSIS — N183 Chronic kidney disease, stage 3 (moderate): Secondary | ICD-10-CM | POA: Diagnosis not present

## 2018-11-04 DIAGNOSIS — M79671 Pain in right foot: Secondary | ICD-10-CM | POA: Diagnosis not present

## 2018-11-04 DIAGNOSIS — D49511 Neoplasm of unspecified behavior of right kidney: Secondary | ICD-10-CM | POA: Insufficient documentation

## 2018-11-09 DIAGNOSIS — J90 Pleural effusion, not elsewhere classified: Secondary | ICD-10-CM | POA: Diagnosis not present

## 2018-11-09 DIAGNOSIS — R41841 Cognitive communication deficit: Secondary | ICD-10-CM | POA: Diagnosis not present

## 2018-11-09 DIAGNOSIS — I5043 Acute on chronic combined systolic (congestive) and diastolic (congestive) heart failure: Secondary | ICD-10-CM | POA: Diagnosis not present

## 2018-11-09 DIAGNOSIS — M6281 Muscle weakness (generalized): Secondary | ICD-10-CM | POA: Diagnosis not present

## 2018-11-09 DIAGNOSIS — R2681 Unsteadiness on feet: Secondary | ICD-10-CM | POA: Diagnosis not present

## 2018-11-09 DIAGNOSIS — R319 Hematuria, unspecified: Secondary | ICD-10-CM | POA: Diagnosis not present

## 2018-11-09 DIAGNOSIS — R1312 Dysphagia, oropharyngeal phase: Secondary | ICD-10-CM | POA: Diagnosis not present

## 2018-11-09 DIAGNOSIS — R2689 Other abnormalities of gait and mobility: Secondary | ICD-10-CM | POA: Diagnosis not present

## 2018-11-09 DIAGNOSIS — R278 Other lack of coordination: Secondary | ICD-10-CM | POA: Diagnosis not present

## 2018-11-09 DIAGNOSIS — R293 Abnormal posture: Secondary | ICD-10-CM | POA: Diagnosis not present

## 2018-11-10 DIAGNOSIS — R319 Hematuria, unspecified: Secondary | ICD-10-CM | POA: Diagnosis not present

## 2018-11-10 DIAGNOSIS — J90 Pleural effusion, not elsewhere classified: Secondary | ICD-10-CM | POA: Diagnosis not present

## 2018-11-10 DIAGNOSIS — R41841 Cognitive communication deficit: Secondary | ICD-10-CM | POA: Diagnosis not present

## 2018-11-10 DIAGNOSIS — R293 Abnormal posture: Secondary | ICD-10-CM | POA: Diagnosis not present

## 2018-11-10 DIAGNOSIS — R1312 Dysphagia, oropharyngeal phase: Secondary | ICD-10-CM | POA: Diagnosis not present

## 2018-11-10 DIAGNOSIS — R2689 Other abnormalities of gait and mobility: Secondary | ICD-10-CM | POA: Diagnosis not present

## 2018-11-10 DIAGNOSIS — R2681 Unsteadiness on feet: Secondary | ICD-10-CM | POA: Diagnosis not present

## 2018-11-10 DIAGNOSIS — M6281 Muscle weakness (generalized): Secondary | ICD-10-CM | POA: Diagnosis not present

## 2018-11-10 DIAGNOSIS — I5043 Acute on chronic combined systolic (congestive) and diastolic (congestive) heart failure: Secondary | ICD-10-CM | POA: Diagnosis not present

## 2018-11-10 DIAGNOSIS — R278 Other lack of coordination: Secondary | ICD-10-CM | POA: Diagnosis not present

## 2018-11-11 DIAGNOSIS — J90 Pleural effusion, not elsewhere classified: Secondary | ICD-10-CM | POA: Diagnosis not present

## 2018-11-11 DIAGNOSIS — R2681 Unsteadiness on feet: Secondary | ICD-10-CM | POA: Diagnosis not present

## 2018-11-11 DIAGNOSIS — R293 Abnormal posture: Secondary | ICD-10-CM | POA: Diagnosis not present

## 2018-11-11 DIAGNOSIS — R278 Other lack of coordination: Secondary | ICD-10-CM | POA: Diagnosis not present

## 2018-11-11 DIAGNOSIS — I5043 Acute on chronic combined systolic (congestive) and diastolic (congestive) heart failure: Secondary | ICD-10-CM | POA: Diagnosis not present

## 2018-11-11 DIAGNOSIS — R2689 Other abnormalities of gait and mobility: Secondary | ICD-10-CM | POA: Diagnosis not present

## 2018-11-11 DIAGNOSIS — R1312 Dysphagia, oropharyngeal phase: Secondary | ICD-10-CM | POA: Diagnosis not present

## 2018-11-11 DIAGNOSIS — M6281 Muscle weakness (generalized): Secondary | ICD-10-CM | POA: Diagnosis not present

## 2018-11-11 DIAGNOSIS — R41841 Cognitive communication deficit: Secondary | ICD-10-CM | POA: Diagnosis not present

## 2018-11-11 DIAGNOSIS — R319 Hematuria, unspecified: Secondary | ICD-10-CM | POA: Diagnosis not present

## 2018-11-14 DIAGNOSIS — J90 Pleural effusion, not elsewhere classified: Secondary | ICD-10-CM | POA: Diagnosis not present

## 2018-11-14 DIAGNOSIS — R278 Other lack of coordination: Secondary | ICD-10-CM | POA: Diagnosis not present

## 2018-11-14 DIAGNOSIS — M6281 Muscle weakness (generalized): Secondary | ICD-10-CM | POA: Diagnosis not present

## 2018-11-14 DIAGNOSIS — R319 Hematuria, unspecified: Secondary | ICD-10-CM | POA: Diagnosis not present

## 2018-11-14 DIAGNOSIS — I5043 Acute on chronic combined systolic (congestive) and diastolic (congestive) heart failure: Secondary | ICD-10-CM | POA: Diagnosis not present

## 2018-11-14 DIAGNOSIS — R1312 Dysphagia, oropharyngeal phase: Secondary | ICD-10-CM | POA: Diagnosis not present

## 2018-11-14 DIAGNOSIS — R293 Abnormal posture: Secondary | ICD-10-CM | POA: Diagnosis not present

## 2018-11-14 DIAGNOSIS — R2681 Unsteadiness on feet: Secondary | ICD-10-CM | POA: Diagnosis not present

## 2018-11-14 DIAGNOSIS — R41841 Cognitive communication deficit: Secondary | ICD-10-CM | POA: Diagnosis not present

## 2018-11-14 DIAGNOSIS — R2689 Other abnormalities of gait and mobility: Secondary | ICD-10-CM | POA: Diagnosis not present

## 2018-11-15 ENCOUNTER — Other Ambulatory Visit: Payer: Self-pay

## 2018-11-15 ENCOUNTER — Emergency Department (HOSPITAL_COMMUNITY): Payer: Medicare HMO

## 2018-11-15 ENCOUNTER — Encounter (HOSPITAL_COMMUNITY): Payer: Self-pay

## 2018-11-15 ENCOUNTER — Inpatient Hospital Stay (HOSPITAL_COMMUNITY)
Admission: EM | Admit: 2018-11-15 | Discharge: 2018-11-29 | DRG: 682 | Disposition: A | Discharge status: Skilled Nursing Facility | Payer: Medicare HMO | Source: Skilled Nursing Facility | Attending: Family Medicine | Admitting: Family Medicine

## 2018-11-15 DIAGNOSIS — Z951 Presence of aortocoronary bypass graft: Secondary | ICD-10-CM

## 2018-11-15 DIAGNOSIS — N390 Urinary tract infection, site not specified: Secondary | ICD-10-CM | POA: Diagnosis present

## 2018-11-15 DIAGNOSIS — E872 Acidosis, unspecified: Secondary | ICD-10-CM | POA: Diagnosis present

## 2018-11-15 DIAGNOSIS — J90 Pleural effusion, not elsewhere classified: Secondary | ICD-10-CM | POA: Diagnosis present

## 2018-11-15 DIAGNOSIS — R0989 Other specified symptoms and signs involving the circulatory and respiratory systems: Secondary | ICD-10-CM

## 2018-11-15 DIAGNOSIS — E875 Hyperkalemia: Secondary | ICD-10-CM | POA: Diagnosis present

## 2018-11-15 DIAGNOSIS — R8281 Pyuria: Secondary | ICD-10-CM

## 2018-11-15 DIAGNOSIS — N179 Acute kidney failure, unspecified: Secondary | ICD-10-CM

## 2018-11-15 DIAGNOSIS — G40909 Epilepsy, unspecified, not intractable, without status epilepticus: Secondary | ICD-10-CM | POA: Diagnosis present

## 2018-11-15 DIAGNOSIS — Z7982 Long term (current) use of aspirin: Secondary | ICD-10-CM

## 2018-11-15 DIAGNOSIS — G4089 Other seizures: Secondary | ICD-10-CM | POA: Diagnosis not present

## 2018-11-15 DIAGNOSIS — I370 Nonrheumatic pulmonary valve stenosis: Secondary | ICD-10-CM | POA: Diagnosis not present

## 2018-11-15 DIAGNOSIS — N178 Other acute kidney failure: Secondary | ICD-10-CM | POA: Diagnosis not present

## 2018-11-15 DIAGNOSIS — I10 Essential (primary) hypertension: Secondary | ICD-10-CM | POA: Diagnosis not present

## 2018-11-15 DIAGNOSIS — I6521 Occlusion and stenosis of right carotid artery: Secondary | ICD-10-CM | POA: Diagnosis present

## 2018-11-15 DIAGNOSIS — N183 Chronic kidney disease, stage 3 (moderate): Secondary | ICD-10-CM | POA: Diagnosis not present

## 2018-11-15 DIAGNOSIS — I361 Nonrheumatic tricuspid (valve) insufficiency: Secondary | ICD-10-CM | POA: Diagnosis not present

## 2018-11-15 DIAGNOSIS — B965 Pseudomonas (aeruginosa) (mallei) (pseudomallei) as the cause of diseases classified elsewhere: Secondary | ICD-10-CM | POA: Diagnosis present

## 2018-11-15 DIAGNOSIS — I2510 Atherosclerotic heart disease of native coronary artery without angina pectoris: Secondary | ICD-10-CM | POA: Diagnosis not present

## 2018-11-15 DIAGNOSIS — R2681 Unsteadiness on feet: Secondary | ICD-10-CM | POA: Diagnosis not present

## 2018-11-15 DIAGNOSIS — G459 Transient cerebral ischemic attack, unspecified: Secondary | ICD-10-CM | POA: Diagnosis not present

## 2018-11-15 DIAGNOSIS — E870 Hyperosmolality and hypernatremia: Secondary | ICD-10-CM | POA: Diagnosis not present

## 2018-11-15 DIAGNOSIS — Z7902 Long term (current) use of antithrombotics/antiplatelets: Secondary | ICD-10-CM

## 2018-11-15 DIAGNOSIS — Z515 Encounter for palliative care: Secondary | ICD-10-CM | POA: Diagnosis not present

## 2018-11-15 DIAGNOSIS — D631 Anemia in chronic kidney disease: Secondary | ICD-10-CM | POA: Diagnosis present

## 2018-11-15 DIAGNOSIS — I480 Paroxysmal atrial fibrillation: Secondary | ICD-10-CM | POA: Diagnosis not present

## 2018-11-15 DIAGNOSIS — Z87440 Personal history of urinary (tract) infections: Secondary | ICD-10-CM

## 2018-11-15 DIAGNOSIS — M6281 Muscle weakness (generalized): Secondary | ICD-10-CM | POA: Diagnosis not present

## 2018-11-15 DIAGNOSIS — R278 Other lack of coordination: Secondary | ICD-10-CM | POA: Diagnosis not present

## 2018-11-15 DIAGNOSIS — I4892 Unspecified atrial flutter: Secondary | ICD-10-CM | POA: Diagnosis present

## 2018-11-15 DIAGNOSIS — I5022 Chronic systolic (congestive) heart failure: Secondary | ICD-10-CM

## 2018-11-15 DIAGNOSIS — Z79890 Hormone replacement therapy (postmenopausal): Secondary | ICD-10-CM

## 2018-11-15 DIAGNOSIS — Z833 Family history of diabetes mellitus: Secondary | ICD-10-CM

## 2018-11-15 DIAGNOSIS — G9341 Metabolic encephalopathy: Secondary | ICD-10-CM | POA: Diagnosis not present

## 2018-11-15 DIAGNOSIS — R41841 Cognitive communication deficit: Secondary | ICD-10-CM | POA: Diagnosis not present

## 2018-11-15 DIAGNOSIS — F0390 Unspecified dementia without behavioral disturbance: Secondary | ICD-10-CM | POA: Diagnosis present

## 2018-11-15 DIAGNOSIS — R627 Adult failure to thrive: Secondary | ICD-10-CM | POA: Diagnosis not present

## 2018-11-15 DIAGNOSIS — R0602 Shortness of breath: Secondary | ICD-10-CM

## 2018-11-15 DIAGNOSIS — I5023 Acute on chronic systolic (congestive) heart failure: Secondary | ICD-10-CM | POA: Diagnosis not present

## 2018-11-15 DIAGNOSIS — I130 Hypertensive heart and chronic kidney disease with heart failure and stage 1 through stage 4 chronic kidney disease, or unspecified chronic kidney disease: Secondary | ICD-10-CM | POA: Diagnosis not present

## 2018-11-15 DIAGNOSIS — R4781 Slurred speech: Secondary | ICD-10-CM | POA: Diagnosis not present

## 2018-11-15 DIAGNOSIS — R2689 Other abnormalities of gait and mobility: Secondary | ICD-10-CM | POA: Diagnosis not present

## 2018-11-15 DIAGNOSIS — Z66 Do not resuscitate: Secondary | ICD-10-CM | POA: Diagnosis not present

## 2018-11-15 DIAGNOSIS — R000 Tachycardia, unspecified: Secondary | ICD-10-CM | POA: Diagnosis not present

## 2018-11-15 DIAGNOSIS — I639 Cerebral infarction, unspecified: Secondary | ICD-10-CM

## 2018-11-15 DIAGNOSIS — E785 Hyperlipidemia, unspecified: Secondary | ICD-10-CM | POA: Diagnosis present

## 2018-11-15 DIAGNOSIS — R52 Pain, unspecified: Secondary | ICD-10-CM | POA: Diagnosis not present

## 2018-11-15 DIAGNOSIS — M2550 Pain in unspecified joint: Secondary | ICD-10-CM | POA: Diagnosis not present

## 2018-11-15 DIAGNOSIS — I5043 Acute on chronic combined systolic (congestive) and diastolic (congestive) heart failure: Secondary | ICD-10-CM | POA: Diagnosis not present

## 2018-11-15 DIAGNOSIS — Z8673 Personal history of transient ischemic attack (TIA), and cerebral infarction without residual deficits: Secondary | ICD-10-CM

## 2018-11-15 DIAGNOSIS — R471 Dysarthria and anarthria: Secondary | ICD-10-CM | POA: Diagnosis not present

## 2018-11-15 DIAGNOSIS — K5730 Diverticulosis of large intestine without perforation or abscess without bleeding: Secondary | ICD-10-CM | POA: Diagnosis not present

## 2018-11-15 DIAGNOSIS — I509 Heart failure, unspecified: Secondary | ICD-10-CM | POA: Diagnosis not present

## 2018-11-15 DIAGNOSIS — R1310 Dysphagia, unspecified: Secondary | ICD-10-CM | POA: Diagnosis not present

## 2018-11-15 DIAGNOSIS — E039 Hypothyroidism, unspecified: Secondary | ICD-10-CM | POA: Diagnosis not present

## 2018-11-15 DIAGNOSIS — R4586 Emotional lability: Secondary | ICD-10-CM | POA: Diagnosis not present

## 2018-11-15 DIAGNOSIS — Z7401 Bed confinement status: Secondary | ICD-10-CM | POA: Diagnosis not present

## 2018-11-15 DIAGNOSIS — K5900 Constipation, unspecified: Secondary | ICD-10-CM | POA: Diagnosis not present

## 2018-11-15 DIAGNOSIS — R1903 Right lower quadrant abdominal swelling, mass and lump: Secondary | ICD-10-CM | POA: Diagnosis not present

## 2018-11-15 DIAGNOSIS — J449 Chronic obstructive pulmonary disease, unspecified: Secondary | ICD-10-CM | POA: Diagnosis present

## 2018-11-15 DIAGNOSIS — N189 Chronic kidney disease, unspecified: Secondary | ICD-10-CM

## 2018-11-15 DIAGNOSIS — Z87891 Personal history of nicotine dependence: Secondary | ICD-10-CM

## 2018-11-15 DIAGNOSIS — I5189 Other ill-defined heart diseases: Secondary | ICD-10-CM | POA: Diagnosis not present

## 2018-11-15 DIAGNOSIS — G629 Polyneuropathy, unspecified: Secondary | ICD-10-CM | POA: Diagnosis present

## 2018-11-15 DIAGNOSIS — R569 Unspecified convulsions: Secondary | ICD-10-CM | POA: Diagnosis not present

## 2018-11-15 DIAGNOSIS — Z823 Family history of stroke: Secondary | ICD-10-CM

## 2018-11-15 DIAGNOSIS — R1312 Dysphagia, oropharyngeal phase: Secondary | ICD-10-CM | POA: Diagnosis not present

## 2018-11-15 DIAGNOSIS — Z8249 Family history of ischemic heart disease and other diseases of the circulatory system: Secondary | ICD-10-CM

## 2018-11-15 DIAGNOSIS — Z950 Presence of cardiac pacemaker: Secondary | ICD-10-CM

## 2018-11-15 DIAGNOSIS — I340 Nonrheumatic mitral (valve) insufficiency: Secondary | ICD-10-CM | POA: Diagnosis not present

## 2018-11-15 DIAGNOSIS — R69 Illness, unspecified: Secondary | ICD-10-CM | POA: Diagnosis not present

## 2018-11-15 LAB — COMPREHENSIVE METABOLIC PANEL
ALK PHOS: 55 U/L (ref 38–126)
ALT: 7 U/L (ref 0–44)
ANION GAP: 10 (ref 5–15)
AST: 11 U/L — ABNORMAL LOW (ref 15–41)
Albumin: 3 g/dL — ABNORMAL LOW (ref 3.5–5.0)
BILIRUBIN TOTAL: 0.8 mg/dL (ref 0.3–1.2)
BUN: 95 mg/dL — AB (ref 8–23)
CALCIUM: 8.8 mg/dL — AB (ref 8.9–10.3)
CO2: 20 mmol/L — ABNORMAL LOW (ref 22–32)
Chloride: 108 mmol/L (ref 98–111)
Creatinine, Ser: 4.46 mg/dL — ABNORMAL HIGH (ref 0.61–1.24)
GFR calc Af Amer: 12 mL/min — ABNORMAL LOW (ref >60)
GFR, EST NON AFRICAN AMERICAN: 11 mL/min — AB (ref >60)
GLUCOSE: 101 mg/dL — AB (ref 70–99)
POTASSIUM: 5.4 mmol/L — AB (ref 3.5–5.1)
Sodium: 138 mmol/L (ref 135–145)
TOTAL PROTEIN: 7.9 g/dL (ref 6.5–8.1)

## 2018-11-15 LAB — URINALYSIS, ROUTINE W REFLEX MICROSCOPIC
BILIRUBIN URINE: NEGATIVE
Bacteria, UA: NONE SEEN
GLUCOSE, UA: NEGATIVE mg/dL
KETONES UR: NEGATIVE mg/dL
Nitrite: NEGATIVE
PROTEIN: NEGATIVE mg/dL
Specific Gravity, Urine: 1.011 (ref 1.005–1.030)
pH: 5 (ref 5.0–8.0)

## 2018-11-15 LAB — CBC WITH DIFFERENTIAL/PLATELET
Abs Immature Granulocytes: 0.02 10*3/uL (ref 0.00–0.07)
Basophils Absolute: 0 10*3/uL (ref 0.0–0.1)
Basophils Relative: 0 %
EOS ABS: 0.2 10*3/uL (ref 0.0–0.5)
EOS PCT: 4 %
HEMATOCRIT: 27.9 % — AB (ref 39.0–52.0)
HEMOGLOBIN: 8.5 g/dL — AB (ref 13.0–17.0)
Immature Granulocytes: 1 %
LYMPHS ABS: 0.4 10*3/uL — AB (ref 0.7–4.0)
LYMPHS PCT: 10 %
MCH: 30.4 pg (ref 26.0–34.0)
MCHC: 30.5 g/dL (ref 30.0–36.0)
MCV: 99.6 fL (ref 80.0–100.0)
MONO ABS: 0.6 10*3/uL (ref 0.1–1.0)
MONOS PCT: 15 %
Neutro Abs: 3 10*3/uL (ref 1.7–7.7)
Neutrophils Relative %: 70 %
Platelets: 149 10*3/uL — ABNORMAL LOW (ref 150–400)
RBC: 2.8 MIL/uL — ABNORMAL LOW (ref 4.22–5.81)
RDW: 16.1 % — AB (ref 11.5–15.5)
WBC: 4.3 10*3/uL (ref 4.0–10.5)
nRBC: 0 % (ref 0.0–0.2)

## 2018-11-15 MED ORDER — POLYETHYLENE GLYCOL 3350 17 G PO PACK: 17.0000 g | PACK | Freq: Every day | ORAL | Status: DC | PRN

## 2018-11-15 MED ORDER — ACETAMINOPHEN 650 MG RE SUPP: 650.0000 mg | Freq: Four times a day (QID) | RECTAL | Status: DC | PRN

## 2018-11-15 MED ORDER — TAMSULOSIN HCL 0.4 MG PO CAPS
0.4000 mg | ORAL_CAPSULE | Freq: Every day | ORAL | Status: DC
  Administered 2018-11-16 – 2018-11-29 (×9): 0.4 mg via ORAL
  Filled 2018-11-15 (×10): qty 1

## 2018-11-15 MED ORDER — ATORVASTATIN CALCIUM 40 MG PO TABS
40.0000 mg | ORAL_TABLET | Freq: Every day | ORAL | Status: DC
  Administered 2018-11-15 – 2018-11-28 (×9): 40 mg via ORAL
  Filled 2018-11-15 (×11): qty 1

## 2018-11-15 MED ORDER — EZETIMIBE 10 MG PO TABS
10.0000 mg | ORAL_TABLET | Freq: Every day | ORAL | Status: DC
  Administered 2018-11-16 – 2018-11-29 (×9): 10 mg via ORAL
  Filled 2018-11-15 (×14): qty 1

## 2018-11-15 MED ORDER — MELATONIN 3 MG PO TABS
3.0000 mg | ORAL_TABLET | Freq: Every day | ORAL | Status: DC
  Administered 2018-11-15 – 2018-11-16 (×2): 3 mg via ORAL
  Filled 2018-11-15 (×2): qty 1

## 2018-11-15 MED ORDER — BISACODYL 5 MG PO TBEC: 5.0000 mg | DELAYED_RELEASE_TABLET | Freq: Every day | ORAL | Status: DC | PRN

## 2018-11-15 MED ORDER — ISOSORBIDE MONONITRATE ER 60 MG PO TB24
90.0000 mg | ORAL_TABLET | Freq: Every day | ORAL | Status: DC
  Administered 2018-11-16 – 2018-11-29 (×8): 90 mg via ORAL
  Filled 2018-11-15 (×10): qty 1

## 2018-11-15 MED ORDER — CARVEDILOL 12.5 MG PO TABS
12.5000 mg | ORAL_TABLET | Freq: Two times a day (BID) | ORAL | Status: DC
  Administered 2018-11-15 – 2018-11-21 (×12): 12.5 mg via ORAL
  Filled 2018-11-15 (×12): qty 1

## 2018-11-15 MED ORDER — POLYETHYLENE GLYCOL 3350 17 G PO PACK: 17.0000 g | PACK | Freq: Every day | ORAL | Status: DC

## 2018-11-15 MED ORDER — SODIUM CHLORIDE 0.9 % IV SOLN
INTRAVENOUS | Status: DC
  Administered 2018-11-15: 22:00:00 via INTRAVENOUS

## 2018-11-15 MED ORDER — ALBUTEROL SULFATE (2.5 MG/3ML) 0.083% IN NEBU
2.5000 mg | INHALATION_SOLUTION | RESPIRATORY_TRACT | Status: DC | PRN
  Administered 2018-11-20 – 2018-11-27 (×2): 2.5 mg via RESPIRATORY_TRACT
  Filled 2018-11-15 (×3): qty 3

## 2018-11-15 MED ORDER — IPRATROPIUM-ALBUTEROL 0.5-2.5 (3) MG/3ML IN SOLN
3.0000 mL | Freq: Two times a day (BID) | RESPIRATORY_TRACT | Status: DC
  Administered 2018-11-15 – 2018-11-23 (×15): 3 mL via RESPIRATORY_TRACT
  Filled 2018-11-15 (×15): qty 3

## 2018-11-15 MED ORDER — SODIUM CHLORIDE 0.9 % IV BOLUS
500.0000 mL | Freq: Once | INTRAVENOUS | Status: AC
  Administered 2018-11-15: 500 mL via INTRAVENOUS

## 2018-11-15 MED ORDER — HYDROCORTISONE 1 % EX CREA
TOPICAL_CREAM | Freq: Two times a day (BID) | CUTANEOUS | Status: DC
  Administered 2018-11-16: 1 via TOPICAL
  Administered 2018-11-16 – 2018-11-17 (×3): via TOPICAL
  Administered 2018-11-18: 1 via TOPICAL
  Administered 2018-11-18 – 2018-11-20 (×5): via TOPICAL
  Administered 2018-11-21: 1 via TOPICAL
  Administered 2018-11-21: 13:00:00 via TOPICAL
  Administered 2018-11-22: 1 via TOPICAL
  Administered 2018-11-26 – 2018-11-29 (×3): via TOPICAL
  Filled 2018-11-15: qty 28

## 2018-11-15 MED ORDER — LEVOTHYROXINE SODIUM 125 MCG PO TABS
125.0000 ug | ORAL_TABLET | Freq: Every day | ORAL | Status: DC
  Administered 2018-11-16 – 2018-11-21 (×5): 125 ug via ORAL
  Filled 2018-11-15 (×6): qty 1

## 2018-11-15 MED ORDER — ACETAMINOPHEN 325 MG PO TABS
650.0000 mg | ORAL_TABLET | Freq: Three times a day (TID) | ORAL | Status: DC
  Administered 2018-11-15 – 2018-11-29 (×27): 650 mg via ORAL
  Filled 2018-11-15 (×31): qty 2

## 2018-11-15 MED ORDER — ASPIRIN 81 MG PO CHEW
81.0000 mg | CHEWABLE_TABLET | Freq: Every day | ORAL | Status: DC
  Administered 2018-11-16 – 2018-11-21 (×6): 81 mg via ORAL
  Filled 2018-11-15 (×6): qty 1

## 2018-11-15 MED ORDER — ONDANSETRON HCL 4 MG PO TABS
4.0000 mg | ORAL_TABLET | Freq: Four times a day (QID) | ORAL | Status: DC | PRN
  Administered 2018-11-17: 4 mg via ORAL
  Filled 2018-11-15: qty 1

## 2018-11-15 MED ORDER — ONDANSETRON HCL 4 MG/2ML IJ SOLN: 4.0000 mg | Freq: Four times a day (QID) | INTRAMUSCULAR | Status: DC | PRN

## 2018-11-15 MED ORDER — CLOPIDOGREL BISULFATE 75 MG PO TABS
75.0000 mg | ORAL_TABLET | Freq: Every day | ORAL | Status: DC
  Administered 2018-11-16 – 2018-11-29 (×11): 75 mg via ORAL
  Filled 2018-11-15 (×11): qty 1

## 2018-11-15 MED ORDER — NITROGLYCERIN 0.3 MG SL SUBL: 0.3000 mg | SUBLINGUAL_TABLET | SUBLINGUAL | Status: DC | PRN

## 2018-11-15 MED ORDER — NITROGLYCERIN 0.4 MG SL SUBL: 0.4000 mg | SUBLINGUAL_TABLET | SUBLINGUAL | Status: DC | PRN

## 2018-11-15 MED ORDER — FERROUS SULFATE 325 (65 FE) MG PO TABS
325.0000 mg | ORAL_TABLET | Freq: Two times a day (BID) | ORAL | Status: DC
  Administered 2018-11-16 – 2018-11-29 (×19): 325 mg via ORAL
  Filled 2018-11-15 (×19): qty 1

## 2018-11-15 MED ORDER — PANTOPRAZOLE SODIUM 40 MG PO TBEC
40.0000 mg | DELAYED_RELEASE_TABLET | Freq: Every day | ORAL | Status: DC
  Administered 2018-11-15 – 2018-11-21 (×7): 40 mg via ORAL
  Filled 2018-11-15 (×7): qty 1

## 2018-11-15 MED ORDER — RANOLAZINE ER 500 MG PO TB12
500.0000 mg | ORAL_TABLET | Freq: Two times a day (BID) | ORAL | Status: DC
  Administered 2018-11-15 – 2018-11-16 (×2): 500 mg via ORAL
  Filled 2018-11-15 (×2): qty 1

## 2018-11-15 MED ORDER — MAGNESIUM CITRATE PO SOLN
0.5000 | Freq: Once | ORAL | Status: AC
  Administered 2018-11-15: 0.5 via ORAL
  Filled 2018-11-15: qty 296

## 2018-11-15 MED ORDER — PHENYLEPHRINE IN HARD FAT 0.25 % RE SUPP
1.0000 | RECTAL | Status: DC | PRN
  Filled 2018-11-15: qty 1

## 2018-11-15 MED ORDER — LATANOPROST 0.005 % OP SOLN
1.0000 [drp] | OPHTHALMIC | Status: DC
  Administered 2018-11-16 – 2018-11-28 (×3): 1 [drp] via OPHTHALMIC
  Filled 2018-11-15: qty 2.5

## 2018-11-15 MED ORDER — SODIUM CHLORIDE 0.9 % IV SOLN
1.0000 g | INTRAVENOUS | Status: DC
  Administered 2018-11-15 – 2018-11-16 (×2): 1 g via INTRAVENOUS
  Filled 2018-11-15: qty 1
  Filled 2018-11-15: qty 10

## 2018-11-15 MED ORDER — ENSURE ENLIVE PO LIQD
237.0000 mL | Freq: Two times a day (BID) | ORAL | Status: DC
  Administered 2018-11-16 – 2018-11-29 (×10): 237 mL via ORAL

## 2018-11-15 MED ORDER — ACETAMINOPHEN 325 MG PO TABS
650.0000 mg | ORAL_TABLET | Freq: Four times a day (QID) | ORAL | Status: DC | PRN
  Administered 2018-11-17 – 2018-11-21 (×5): 650 mg via ORAL
  Filled 2018-11-15 (×3): qty 2

## 2018-11-15 NOTE — H&P (Signed)
H&P        History and Physical    John Peck XBD:[id number] DOB: [birth year] DOA: 11/15/2018  PCP: Seward Carol, MD  Patient coming from: Skilled nursing facility  I have personally briefly reviewed patient's old medical records in [place]  Chief Complaint: Inguinal hernia  HPI: John Peck is a 85 y.o. male with medical history significant of chronic kidney disease, CHF, chronic anemia presents with right-sided inguinal hernia complaints.  It is been hurting him over the past 2 weeks.  Has been treated supportively at the nursing facility with Tylenol and ibuprofen.  Was no inguinal hernia on my or ER clinicians physical exam.  It was also not seen on abdominal pelvic CT.  Patient denies any chest pain shortness of breath, or  fever.  He does follow a fluid restricted diet at the facility.  He also eats a mechanical soft diet, and  was not eating very well so family has been giving him additional food to entice him to eat as well.  Patient is unsure if he has had a urinary changes.  Had a bowel movements morning but it was small. ED Course: Patient CT scan of abdomen and pelvis that did not show any inguinal hernia.  Also did not show any stones or hydronephrosis.  Patient does have a chronic right-sided pleural effusion that was present again today.  He was found to have acute on chronic renal failure with a creatinine of 4.4 with prior being 1.8.  He also had some white cells in his urine and has had previous UTI in the past.  Review of Systems: Denies fever cough shortness of breath nausea vomiting chest pain or shortness of breath All others reviewed with patient  and are  negative unless otherwise stated    Past Medical History:  Diagnosis Date  . Anemia   . Arthritis   . CAD (coronary artery disease)    a. CABG '89; b. PCI '04; c. 11/2015 Cath/PCI: LM 20ost, LAD 157m, LCX 30p, OM2 40, RCA 30p, 10p ISR, 90d (3.0x12 Synergy DES), AM 90, VG->OM2 known to be 100,  LIMA->LAD not injected, patent in 2015.  . Carotid stenosis, right   . Chronic combined systolic and diastolic CHF (congestive heart failure) ([place])   . CKD (chronic kidney disease), stage III ([place])   . Dyspnea   . H/O: GI bleed    REMOTE HISTORY  . History of COPD   . Hyperlipidemia   . Hypertensive heart disease   . LBBB (left bundle branch block)   . Pacemaker Sept 2009   St Jude  . PAF (paroxysmal atrial fibrillation) (HCC)    a. not on anticoag due to prior GIB.  . Sick sinus syndrome [facility]) Sept 2009   ST Jude PTVDP  . Stroke [facility])     Past Surgical History:  Procedure Laterality Date  . CARDIAC CATHETERIZATION  11/2011   EF 50%; significant native CAD w/80% stenosis of the LAD after 2nd giagonal vessel and septal perforating artery w/total occlusion of the mid left anterior descendiung.; 60-70% ostiasl stenosis in the circumflex vessel followed by 40% proximal stenosis and 70-80% distal circumflex stenosis;   . CARDIAC CATHETERIZATION N/A 11/29/2015   Procedure: Left Heart Cath and Cors/Grafts Angiography;  Surgeon: Burnell Blanks, MD;  Location: [place] CV LAB;  Service: Cardiovascular;  Laterality: N/A;  . CARDIAC CATHETERIZATION N/A 11/29/2015   Procedure: Coronary Stent Intervention;  Surgeon: Burnell Blanks, MD;  Location:  [place] INVASIVE CV LAB;  Service: Cardiovascular;  Laterality: N/A;  . CATARACT EXTRACTION  2004  . CORONARY ANGIOGRAPHY N/A 02/25/2017   Procedure: Coronary Angiography;  Surgeon: Belva Crome, MD;  Location: [place] CV LAB;  Service: Cardiovascular;  Laterality: N/A;  . CORONARY ANGIOPLASTY WITH STENT PLACEMENT  2004   RCA  . CORONARY ARTERY BYPASS GRAFT  1989   had LIMA to his LAD, a vein to the circumflex. In 2004 underwent stenting to his right coronary artery.  . CORONARY STENT INTERVENTION N/A 02/24/2017   Procedure: Coronary Stent Intervention;  Surgeon: Wellington Hampshire, MD;  Location: [place] CV LAB;  Service:  Cardiovascular;  Laterality: N/A;  cfx  . HEMORRHOID SURGERY    . INSERT / REPLACE / REMOVE PACEMAKER  07/17/08   DUAL-CHAMBER; PPM-ST.JUDE MEDNET  . IR THORACENTESIS ASP PLEURAL SPACE W/IMG GUIDE  05/25/2018  . IR THORACENTESIS ASP PLEURAL SPACE W/IMG GUIDE  08/01/2018  . LEFT HEART CATH AND CORS/GRAFTS ANGIOGRAPHY N/A 02/24/2017   Procedure: Left Heart Cath and Cors/Grafts Angiography;  Surgeon: Wellington Hampshire, MD;  Location: [place] CV LAB;  Service: Cardiovascular;  Laterality: N/A;  . LEFT HEART CATHETERIZATION WITH CORONARY/GRAFT ANGIOGRAM N/A 12/10/2011   Procedure: LEFT HEART CATHETERIZATION WITH Beatrix Fetters;  Surgeon: Troy Sine, MD;  Location: [facility] CATH LAB;  Service: Cardiovascular;  Laterality: N/A;  . LEFT HEART CATHETERIZATION WITH CORONARY/GRAFT ANGIOGRAM N/A 01/26/2014   Procedure: LEFT HEART CATHETERIZATION WITH Beatrix Fetters;  Surgeon: Troy Sine, MD;  Location: [facility] CATH LAB;  Service: Cardiovascular;  Laterality: N/A;  . PPM GENERATOR CHANGEOUT N/A 12/15/2017   Procedure: PPM GENERATOR CHANGEOUT;  Surgeon: Evans Lance, MD;  Location: [place] CV LAB;  Service: Cardiovascular;  Laterality: N/A;  . PROSTATECTOMY       reports that he quit smoking about 21 years ago. His smoking use included cigarettes. He has a 48.75 pack-year smoking history. He quit smokeless tobacco use about 10 years ago. He reports that he does not drink alcohol or use drugs.  Wheelchair  No Known Allergies  Family History  Problem Relation Age of Onset  . Diabetes Father   . Stroke Sister   . Heart disease Sister   . Heart disease Brother   . Heart attack Brother   . Stroke Brother   . Hypertension Neg Hx      Prior to Admission medications   Medication Sig Start Date End Date Taking? Authorizing Provider  acetaminophen (TYLENOL) 325 MG tablet Take 650 mg by mouth 3 (three) times daily.   Yes [provider]  aspirin 81 MG tablet Take 81 mg by mouth  daily.    Yes [provider]  atorvastatin (LIPITOR) 40 MG tablet Take 1 tablet (40 mg total) by mouth every evening. 09/01/18  Yes Aline August, MD  carvedilol (COREG) 12.5 MG tablet Take 1 tablet (12.5 mg total) by mouth 2 (two) times daily with a meal. 05/27/18  Yes Emokpae, Courage, MD  clopidogrel (PLAVIX) 75 MG tablet Take 1 tablet (75 mg total) by mouth daily. 05/27/18  Yes Emokpae, Courage, MD  desonide (DESOWEN) 0.05 % cream Apply 1 application topically 2 (two) times daily.   Yes [provider]  ezetimibe (ZETIA) 10 MG tablet Take 10 mg by mouth daily.   Yes [provider]  ferrous sulfate 325 (65 FE) MG tablet Take 1 tablet (325 mg total) by mouth 2 (two) times daily with a meal. 05/27/18  Yes  Roxan Hockey, MD  furosemide (LASIX) 20 MG tablet Take 1 tablet (20 mg total) by mouth 2 (two) times daily. 08/12/18  Yes Domenic Polite, MD  ibuprofen (ADVIL,MOTRIN) 200 MG tablet Take 800 mg by mouth every 8 (eight) hours.   Yes [provider]  ipratropium-albuterol (DUONEB) 0.5-2.5 (3) MG/3ML SOLN Take 3 mLs by nebulization 2 (two) times daily. 02/24/18  Yes Mikhail, Velta Addison, DO  isosorbide mononitrate (IMDUR) 30 MG 24 hr tablet Take 3 tablets (90 mg total) by mouth daily. 05/28/18  Yes Emokpae, Courage, MD  latanoprost (XALATAN) 0.005 % ophthalmic solution Place 1 drop into both eyes 2 (two) times a week. On mondays and wednesdays 11/07/15  Yes [provider]  levothyroxine (SYNTHROID, LEVOTHROID) 125 MCG tablet TAKE 1 TABLET BY MOUTH EVERY DAY BEFORE BREAKFAST Patient taking differently: Take 125 mcg by mouth daily before breakfast.  04/05/18  Yes Troy Sine, MD  Melatonin 3 MG TABS Take 3 mg by mouth at bedtime.   Yes [provider]  pantoprazole (PROTONIX) 40 MG tablet Take 1 tablet (40 mg total) by mouth daily. 05/28/18  Yes Emokpae, Courage, MD  polyethylene glycol (MIRALAX / GLYCOLAX) packet Take 17 g by mouth daily. 05/27/18   Yes Emokpae, Courage, MD  ranolazine (RANEXA) 500 MG 12 hr tablet Take 1 tablet (500 mg total) by mouth 2 (two) times daily. 09/01/18  Yes Aline August, MD  shark liver oil-cocoa butter (PREPARATION H) 0.25-3-85.5 % suppository Place 1 suppository rectally as needed for hemorrhoids.   Yes [provider]  tamsulosin (FLOMAX) 0.4 MG CAPS capsule Take 1 capsule (0.4 mg total) by mouth daily. 05/27/18  Yes Emokpae, Courage, MD  albuterol (PROVENTIL HFA;VENTOLIN HFA) 108 (90 Base) MCG/ACT inhaler Inhale 2 puffs into the lungs every 4 (four) hours as needed for wheezing or shortness of breath (or coughing). 05/27/18   Roxan Hockey, MD  nitroGLYCERIN (NITROSTAT) 0.4 MG SL tablet Every 5 min x3 PRN chest pain 09/01/18   Aline August, MD    Physical Exam: Vitals:   11/15/18 1935 11/15/18 2030 11/15/18 2110 11/15/18 2149  BP:  (!) 162/82 (!) 170/88   Pulse: (!) 59 63 60   Resp:   19   Temp:   (!) 96.8 F (36 C)   TempSrc:      SpO2: 99% 100% 100% 99%  Weight:   67.1 kg   Height:   5\' 9"  (1.753 m)     Constitutional: NAD, calm, comfortable Vitals:   11/15/18 1935 11/15/18 2030 11/15/18 2110 11/15/18 2149  BP:  (!) 162/82 (!) 170/88   Pulse: (!) 59 63 60   Resp:   19   Temp:   (!) 96.8 F (36 C)   TempSrc:      SpO2: 99% 100% 100% 99%  Weight:   67.1 kg   Height:   5\' 9"  (1.753 m)    Eyes: PERRL, lids and conjunctivae normal ENMT: Mucous membranes are moist. Posterior pharynx clear of any exudate or lesions.poor dentition.  Neck: normal, supple,  Respiratory: clear to auscultation bilaterally, good breath sounds in the bases normal respiratory effort. No accessory muscle use.  Cardiovascular: Regular rate and rhythm,  No extremity edema. 2+ pedal pulses.  Abdomen: no tenderness, no masses palpated.  No hernias palpated Bowel sounds positive.  Musculoskeletal: no clubbing / cyanosis.  Skin: no rashes, lesions, ulcers. No induration Neurologic: CN 2-12 grossly intact.    Psychiatric:  Alert, follows commands very decreased hearing so hard  to test his orientation, normal mood.     Labs on Admission: I have personally reviewed following labs and imaging studies  CBC: Recent Labs  Lab 11/15/18 1603  WBC 4.3  NEUTROABS 3.0  HGB 8.5*  HCT 27.9*  MCV 99.6  PLT 160*   Basic Metabolic Panel: Recent Labs  Lab 11/15/18 1603  NA 138  K 5.4*  CL 108  CO2 20*  GLUCOSE 101*  BUN 95*  CREATININE 4.46*  CALCIUM 8.8*   GFR: Estimated Creatinine Clearance: 10 mL/min (A) (by C-G formula based on SCr of 4.46 mg/dL (H)). Liver Function Tests: Recent Labs  Lab 11/15/18 1603  AST 11*  ALT 7  ALKPHOS 55  BILITOT 0.8  PROT 7.9  ALBUMIN 3.0*   No results for input(s): LIPASE, AMYLASE in the last 168 hours. No results for input(s): AMMONIA in the last 168 hours. Coagulation Profile: No results for input(s): INR, PROTIME in the last 168 hours. Cardiac Enzymes: No results for input(s): CKTOTAL, CKMB, CKMBINDEX, TROPONINI in the last 168 hours. BNP (last 3 results) No results for input(s): PROBNP in the last 8760 hours. HbA1C: No results for input(s): HGBA1C in the last 72 hours. CBG: No results for input(s): GLUCAP in the last 168 hours. Lipid Profile: No results for input(s): CHOL, HDL, LDLCALC, TRIG, CHOLHDL, LDLDIRECT in the last 72 hours. Thyroid Function Tests: No results for input(s): TSH, T4TOTAL, FREET4, T3FREE, THYROIDAB in the last 72 hours. Anemia Panel: No results for input(s): VITAMINB12, FOLATE, FERRITIN, TIBC, IRON, RETICCTPCT in the last 72 hours. Urine analysis:    Component Value Date/Time   COLORURINE YELLOW 11/15/2018 1631   APPEARANCEUR CLEAR 11/15/2018 1631   LABSPEC 1.011 11/15/2018 1631   PHURINE 5.0 11/15/2018 1631   GLUCOSEU NEGATIVE 11/15/2018 1631   HGBUR SMALL (A) 11/15/2018 1631   BILIRUBINUR NEGATIVE 11/15/2018 1631   KETONESUR NEGATIVE 11/15/2018 1631   PROTEINUR NEGATIVE 11/15/2018 1631   UROBILINOGEN 0.2  05/09/2015 1523   NITRITE NEGATIVE 11/15/2018 1631   LEUKOCYTESUR TRACE (A) 11/15/2018 1631    Radiological Exams on Admission: Ct Abdomen Pelvis Wo Contrast  Result Date: 11/15/2018 CLINICAL DATA:  85 year old male with right-sided groin pain for 2 days. EXAM: CT ABDOMEN AND PELVIS WITHOUT CONTRAST TECHNIQUE: Multidetector CT imaging of the abdomen and pelvis was performed following the standard protocol without IV contrast. COMPARISON:  08/01/2018 CT.  11/04/2018 renal sonogram. FINDINGS: Lower chest: Moderately large right-sided pleural effusion with adjacent compressive atelectasis. Pleural fluid may be partially loculated. Minimal scarring/basilar atelectasis left base. Mild cardiomegaly with pacemaker in place. Hepatobiliary: Taking into account limitation by non contrast imaging, no worrisome hepatic lesion. Scattered small calcifications. 6 mm calcified gallstone. No CT evidence of gallbladder inflammation. Pancreas: Taking into account limitation by non contrast imaging, no worrisome pancreatic mass or inflammation. Spleen: Taking into account limitation by non contrast imaging, no splenic mass or enlargement. Adrenals/Urinary Tract: Mild adrenal gland hyperplasia. Slightly atrophic right kidney. No obstructing stone or hydronephrosis. Right renal cyst. Taking into account limitation by non contrast imaging, no worrisome adrenal lesion. Appearance of prior TURP. Bladder otherwise unremarkable. Stomach/Bowel: Prominent diverticulosis and muscular hypertrophy descending colon and sigmoid colon without extraluminal inflammation. Moderate stool throughout remainder of the colon. Appendix not visualized. Under distended stomach. No gross abnormality noted. No obvious small bowel abnormality. Vascular/Lymphatic: Prominent atherosclerotic changes aorta and aortic branch vessels. Lower abdominal aortic aneurysm with maximal transverse dimension of 3.5 cm without significant change. Scattered normal size  lymph nodes. Reproductive: Prior TURP  for prostatectomy. Other: No free air or bowel containing hernia. No right inguinal canal hernia. Mild third spacing of fluid.  No clear sacral decubitus. Musculoskeletal: Fusion sacroiliac joints. Partial fusion lumbar spine and lower thoracic spine. IMPRESSION: 1. No right inguinal canal hernia detected as cause of patient's right groin pain. Stool filled cecum extends towards the superior margin of the inguinal canal but does not enter such. 2. Moderately large right-sided pleural effusion with adjacent compressive atelectasis. Pleural fluid may be partially loculated. Minimal scarring/basilar atelectasis left base. 3. Mild cardiomegaly with pacemaker in place. 4. Diverticulosis with prominent muscular hypertrophy descending colon and sigmoid colon. Moderate stool throughout remainder of colon. 5. 6 mm calcified gallstone without CT evidence of gallbladder inflammation. 6. Aortic Atherosclerosis (ICD10-I70.0). Lower abdominal aortic aneurysm with maximal transverse dimension of 3.5 cm. Recommend followup by ultrasound in 2 years. This recommendation follows ACR consensus guidelines: White Paper of the ACR Incidental Findings Committee II on Vascular Findings. J Am Coll Radiol 2013; 10:789-794. 7. Fusion sacroiliac joints. Partial fusion lumbar spine lower thoracic spine. Electronically Signed   By: Genia Del M.D.   On: 11/15/2018 18:04      Assessment/Plan Principal Problem:   Renal failure (ARF), acute on chronic (HCC) Mild hyperkalemia Active Problems:   Constipation   Pyuria   Chronic systolic CHF (congestive heart failure) (HCC)   Pleural effusion   PAF (paroxysmal atrial fibrillation) (HCC) not on anticoagulation   CAD in native artery   Hypothyroidism   Dementia ([place])   1. discontinue NSAIDs, avoid nephrotoxins ,gentle IV fluids, repeat BMP in the morning. consider nephrology consultation ,CT did not show any stones or hydronephrosis 2.  IV  Rocephin follow urine culture has had Proteus in  the past sensitive to Rocephin 3.  Monitor ins and outs and daily weights continue medical management 4.  Chronic pleural effusion stable 5.  Give half a bottle of magnesium citrate, increase MiraLAX to twice a day 6.Chronic anemia hemoglobin 8.5 today stable  the past 2 months.  Baseline 9-10.    DVT prophylaxis: SCDs Code Status: DNR Family Communication: Daughters at bedside Disposition Plan: To SNF to 3 days Admission status: Inpatient telemetry It is my clinical opinion that admission to INPATIENT is reasonable and necessary because of the expectation that this patient will require hospital care that crosses at least 2 midnights to treat this condition based on the medical complexity of the problems presented.    Shelbie Proctor MD [facility] Pager [phone]  If 7PM-7AM, please contact night-coverage [email or web address] Password [facility]  11/15/2018, 10:47 PM

## 2018-11-15 NOTE — Plan of Care (Signed)
Plan of care discussed.   

## 2018-11-15 NOTE — ED Triage Notes (Signed)
Pt arrived via EMS from [facility]. Pt has been having RT sided groin pain x 2 days, Pt was given tylenol at facility and pt reports medication effective.   EMS v/s 148/80, HR 60 RR 16

## 2018-11-15 NOTE — ED Notes (Signed)
Pt dtr is at bedside. Pt state that his pain is almost gone.

## 2018-11-15 NOTE — ED Provider Notes (Signed)
[facility] DEPT Provider Note   CSN: [id number] Arrival date & time: 11/15/18  1413     History   Chief Complaint Chief Complaint  Patient presents with  . Groin Pain    HPI John Peck is a 85 y.o. male presenting today from nursing home for intermittent right groin pain.  Patient states that he first noticed the pain approximately 4 weeks ago with a bulge present.  Patient states that this bulge comes and goes and is mostly present whenever he is having a bowel movement or sitting up.  Patient describes his pain as a throbbing pressure at that is worse with sitting up and palpation and moderate in intensity.  Patient has been using Tylenol with some relief of symptoms.  Patiently patient complains of dysuria that has been present for the past 3-4 days.  Patient denies fever, penile discharge, testicular pain/swelling, rectal pain, abdominal pain, nausea/vomiting or diarrhea or any additional concerns.  HPI  Past Medical History:  Diagnosis Date  . Anemia   . Arthritis   . CAD (coronary artery disease)    a. CABG '89; b. PCI '04; c. 11/2015 Cath/PCI: LM 20ost, LAD 119m, LCX 30p, OM2 40, RCA 30p, 10p ISR, 90d (3.0x12 Synergy DES), AM 90, VG->OM2 known to be 100, LIMA->LAD not injected, patent in 2015.  . Carotid stenosis, right   . Chronic combined systolic and diastolic CHF (congestive heart failure) ([place])   . CKD (chronic kidney disease), stage III ([place])   . Dyspnea   . H/O: GI bleed    REMOTE HISTORY  . History of COPD   . Hyperlipidemia   . Hypertensive heart disease   . LBBB (left bundle branch block)   . Pacemaker Sept 2009   St Jude  . PAF (paroxysmal atrial fibrillation) (HCC)    a. not on anticoag due to prior GIB.  . Sick sinus syndrome [facility]) Sept 2009   ST Jude PTVDP  . Stroke [facility])     Patient Active Problem List   Diagnosis Date Noted  . Dysphagia   . AKI (acute kidney injury) ([place])   . Altered mental status  08/09/2018  . Delirium 08/09/2018  . UTI (urinary tract infection) 08/09/2018  . Right flank pain   . Goals of care, counseling/discussion   . Palliative care encounter   . Chest pain 07/29/2018  . TIA (transient ischemic attack) 06/29/2018  . Pressure injury of skin 05/25/2018  . Pleural effusion 05/24/2018  . Acute respiratory failure with hypoxia ([place]) 05/24/2018  . NSTEMI (non-ST elevated myocardial infarction) ([place])   . Chronic systolic CHF (congestive heart failure) ([place]) 03/14/2018  . COPD clinical dx/ no pfts on record 03/14/2018  . H/O: GI bleed 03/14/2018  . CKD (chronic kidney disease), stage III ([place]) 03/14/2018  . Macrocytic anemia 03/14/2018  . Paroxysmal atrial fibrillation ([place]) 03/14/2018  . Hypothyroidism 03/14/2018  . Carotid stenosis, right 03/14/2018  . Facial droop 02/22/2018  . Pancytopenia ([place]) 02/22/2018  . Generalized weakness   . Hypertensive heart disease   . Hyperlipidemia   . Stented coronary artery   . Coronary artery disease involving native coronary artery of native heart with unstable angina pectoris ([place])   . Reactive airway disease 11/27/2015  . CAD in native artery 09/03/2015  . Weakness 05/09/2015  . Hyperlipidemia LDL goal <70 02/28/2015  . Atherosclerosis of lower extremity with claudication ([place]) 07/17/2014  . PAF (paroxysmal atrial fibrillation) ([place]) 03/24/2013  . Lower extremity edema 03/24/2013  .  Sinoatrial node dysfunction ([place]) 08/17/2012  . History of iron deficiency anemia 09/18/2011  . Essential hypertension 07/07/2009  . INTERMEDIATE CORONARY SYNDROME 07/07/2009  . S/P CABG x 2 '89. RCA stent'04. last cath Jan 2013 07/07/2009  . PACEMAKER, PERMANENT- St Jude Sept 2009 07/07/2009  . Sick sinus syndrome ([place]) 07/10/2008  . Pacemaker 07/10/2008    Past Surgical History:  Procedure Laterality Date  . CARDIAC CATHETERIZATION  11/2011   EF 50%; significant native CAD w/80% stenosis of the LAD after 2nd giagonal vessel and  septal perforating artery w/total occlusion of the mid left anterior descendiung.; 60-70% ostiasl stenosis in the circumflex vessel followed by 40% proximal stenosis and 70-80% distal circumflex stenosis;   . CARDIAC CATHETERIZATION N/A 11/29/2015   Procedure: Left Heart Cath and Cors/Grafts Angiography;  Surgeon: Burnell Blanks, MD;  Location: [place] CV LAB;  Service: Cardiovascular;  Laterality: N/A;  . CARDIAC CATHETERIZATION N/A 11/29/2015   Procedure: Coronary Stent Intervention;  Surgeon: Burnell Blanks, MD;  Location: [place] CV LAB;  Service: Cardiovascular;  Laterality: N/A;  . CATARACT EXTRACTION  2004  . CORONARY ANGIOGRAPHY N/A 02/25/2017   Procedure: Coronary Angiography;  Surgeon: Belva Crome, MD;  Location: [place] CV LAB;  Service: Cardiovascular;  Laterality: N/A;  . CORONARY ANGIOPLASTY WITH STENT PLACEMENT  2004   RCA  . CORONARY ARTERY BYPASS GRAFT  1989   had LIMA to his LAD, a vein to the circumflex. In 2004 underwent stenting to his right coronary artery.  . CORONARY STENT INTERVENTION N/A 02/24/2017   Procedure: Coronary Stent Intervention;  Surgeon: Wellington Hampshire, MD;  Location: [place] CV LAB;  Service: Cardiovascular;  Laterality: N/A;  cfx  . HEMORRHOID SURGERY    . INSERT / REPLACE / REMOVE PACEMAKER  07/17/08   DUAL-CHAMBER; PPM-ST.JUDE MEDNET  . IR THORACENTESIS ASP PLEURAL SPACE W/IMG GUIDE  05/25/2018  . IR THORACENTESIS ASP PLEURAL SPACE W/IMG GUIDE  08/01/2018  . LEFT HEART CATH AND CORS/GRAFTS ANGIOGRAPHY N/A 02/24/2017   Procedure: Left Heart Cath and Cors/Grafts Angiography;  Surgeon: Wellington Hampshire, MD;  Location: [place] CV LAB;  Service: Cardiovascular;  Laterality: N/A;  . LEFT HEART CATHETERIZATION WITH CORONARY/GRAFT ANGIOGRAM N/A 12/10/2011   Procedure: LEFT HEART CATHETERIZATION WITH Beatrix Fetters;  Surgeon: Troy Sine, MD;  [facility];  Service: Cardiovascular;  Laterality: N/A;  . LEFT  HEART CATHETERIZATION WITH CORONARY/GRAFT ANGIOGRAM N/A 01/26/2014   Procedure: LEFT HEART CATHETERIZATION WITH Beatrix Fetters;  Surgeon: Troy Sine, MD;  Location: [facility] C[facility] LAB;  Service: Cardiovascular;  Laterality: N/A;  . PPM GENERATOR CHANGEOUT N/A 12/15/2017   Procedure: PPM GENERATOR CHANGEOUT;  Surgeon: Evans Lance, MD;  Location: [place] CV LAB;  Service: Cardiovascular;  Laterality: N/A;  . PROSTATECTOMY          Home Medications    Prior to Admission medications   Medication Sig Start Date End Date Taking? Authorizing Provider  acetaminophen (TYLENOL) 325 MG tablet Take 650 mg by mouth 3 (three) times daily.   Yes [provider]  aspirin 81 MG tablet Take 81 mg by mouth daily.    Yes [provider]  atorvastatin (LIPITOR) 40 MG tablet Take 1 tablet (40 mg total) by mouth every evening. 09/01/18  Yes Aline August, MD  carvedilol (COREG) 12.5 MG tablet Take 1 tablet (12.5 mg total) by mouth 2 (two) times daily with a meal. 05/27/18  Yes Emokpae, Courage, MD  clopidogrel (PLAVIX)  75 MG tablet Take 1 tablet (75 mg total) by mouth daily. 05/27/18  Yes Emokpae, Courage, MD  desonide (DESOWEN) 0.05 % cream Apply 1 application topically 2 (two) times daily.   Yes [provider]  ezetimibe (ZETIA) 10 MG tablet Take 10 mg by mouth daily.   Yes [provider]  ferrous sulfate 325 (65 FE) MG tablet Take 1 tablet (325 mg total) by mouth 2 (two) times daily with a meal. 05/27/18  Yes Emokpae, Courage, MD  furosemide (LASIX) 20 MG tablet Take 1 tablet (20 mg total) by mouth 2 (two) times daily. 08/12/18  Yes Domenic Polite, MD  ibuprofen (ADVIL,MOTRIN) 200 MG tablet Take 800 mg by mouth every 8 (eight) hours.   Yes [provider]  ipratropium-albuterol (DUONEB) 0.5-2.5 (3) MG/3ML SOLN Take 3 mLs by nebulization 2 (two) times daily. 02/24/18  Yes Mikhail, Velta Addison, DO  isosorbide mononitrate (IMDUR) 30 MG 24 hr tablet Take 3 tablets  (90 mg total) by mouth daily. 05/28/18  Yes Emokpae, Courage, MD  latanoprost (XALATAN) 0.005 % ophthalmic solution Place 1 drop into both eyes 2 (two) times a week. On mondays and wednesdays 11/07/15  Yes [provider]  levothyroxine (SYNTHROID, LEVOTHROID) 125 MCG tablet TAKE 1 TABLET BY MOUTH EVERY DAY BEFORE BREAKFAST Patient taking differently: Take 125 mcg by mouth daily before breakfast.  04/05/18  Yes Troy Sine, MD  Melatonin 3 MG TABS Take 3 mg by mouth at bedtime.   Yes [provider]  pantoprazole (PROTONIX) 40 MG tablet Take 1 tablet (40 mg total) by mouth daily. 05/28/18  Yes Emokpae, Courage, MD  polyethylene glycol (MIRALAX / GLYCOLAX) packet Take 17 g by mouth daily. 05/27/18  Yes Emokpae, Courage, MD  ranolazine (RANEXA) 500 MG 12 hr tablet Take 1 tablet (500 mg total) by mouth 2 (two) times daily. 09/01/18  Yes Aline August, MD  shark liver oil-cocoa butter (PREPARATION H) 0.25-3-85.5 % suppository Place 1 suppository rectally as needed for hemorrhoids.   Yes [provider]  tamsulosin (FLOMAX) 0.4 MG CAPS capsule Take 1 capsule (0.4 mg total) by mouth daily. 05/27/18  Yes Emokpae, Courage, MD  albuterol (PROVENTIL HFA;VENTOLIN HFA) 108 (90 Base) MCG/ACT inhaler Inhale 2 puffs into the lungs every 4 (four) hours as needed for wheezing or shortness of breath (or coughing). 05/27/18   Roxan Hockey, MD  nitroGLYCERIN (NITROSTAT) 0.4 MG SL tablet Every 5 min x3 PRN chest pain 09/01/18   Aline August, MD    Family History Family History  Problem Relation Age of Onset  . Diabetes Father   . Stroke Sister   . Heart disease Sister   . Heart disease Brother   . Heart attack Brother   . Stroke Brother   . Hypertension Neg Hx     Social History Social History   Tobacco Use  . Smoking status: Former Smoker    Packs/day: 0.75    Years: 65.00    Pack years: 48.75    Types: Cigarettes    Last attempt to quit: 11/09/1997    Years since  quitting: 21.0  . Smokeless tobacco: Former Systems developer    Quit date: 10/29/2008  Substance Use Topics  . Alcohol use: No  . Drug use: No     Allergies   Patient has no known allergies.   Review of Systems Review of Systems  Constitutional: Negative.  Negative for chills and fever.  Respiratory: Negative.  Negative for cough and shortness of breath.  Cardiovascular: Negative.  Negative for chest pain.  Gastrointestinal: Positive for abdominal distention and abdominal pain. Negative for blood in stool, diarrhea, nausea and vomiting.  Genitourinary: Positive for dysuria. Negative for hematuria, scrotal swelling and testicular pain.  Musculoskeletal: Positive for arthralgias (Chronic bilateral foot pain). Negative for back pain and neck pain.  Skin: Negative.  Negative for rash.  Neurological: Positive for weakness (Generalized). Negative for dizziness, syncope, numbness and headaches.   Physical Exam Updated Vital Signs BP (!) 143/79 (BP Location: Left Arm)   Pulse 60   Temp (!) 97.1 F (36.2 C) (Axillary)   Resp 16   SpO2 99%   Physical Exam Constitutional:      General: He is not in acute distress.    Appearance: He is well-developed. He is not ill-appearing or diaphoretic.  HENT:     Head: Normocephalic and atraumatic.     Right Ear: External ear normal.     Left Ear: External ear normal.     Nose: Nose normal.     Mouth/Throat:     Mouth: Mucous membranes are moist.     Pharynx: Oropharynx is clear.  Eyes:     Extraocular Movements: Extraocular movements intact.     Pupils: Pupils are equal, round, and reactive to light.  Neck:     Musculoskeletal: Normal range of motion.     Trachea: Trachea normal. No tracheal deviation.  Cardiovascular:     Rate and Rhythm: Normal rate and regular rhythm.     Pulses:          Dorsalis pedis pulses are 1+ on the right side and 1+ on the left side.       Posterior tibial pulses are 1+ on the right side and 1+ on the left side.      Heart sounds: Normal heart sounds.  Pulmonary:     Effort: Pulmonary effort is normal. No respiratory distress.     Breath sounds: Normal breath sounds.  Abdominal:     General: Bowel sounds are normal.     Palpations: Abdomen is soft.     Tenderness: There is no abdominal tenderness. There is no guarding or rebound.  Genitourinary:      Comments: Examination chaperoned by Deirdre NT.  Indicated is patient's area where he says that he has pain and bulging occasionally.  No external genital lesions noted, no bumps on head of penis, specifically no vesicles concerning for herpes or chancre suggestive of syphilis.  No pain with palpation of the penis/glans, no discharge or urethritis noted.  Scrotum and testicles without erythema/swelling or tenderness to palpation. Cremasteric reflex intact bilaterally. No palpable hernia noted.  Musculoskeletal: Normal range of motion.     Right lower leg: Normal.     Left lower leg: Normal.     Comments: Patient is able to perform bilateral knees to chest without pain or difficulty.  Patient moving all extremities without difficulty.  Feet:     Right foot:     Protective Sensation: 3 sites tested. 3 sites sensed.     Left foot:     Protective Sensation: 3 sites tested. 3 sites sensed.  Skin:    General: Skin is warm and dry.     Capillary Refill: Capillary refill takes less than 2 seconds.  Neurological:     Mental Status: He is alert and oriented to person, place, and time.     GCS: GCS eye subscore is 4. GCS verbal subscore is 5. GCS motor subscore  is 6.     Comments: Speech is clear and goal oriented, follows commands Major Cranial nerves without deficit, no facial droop Normal strength in upper and lower extremities bilaterally including dorsiflexion and plantar flexion, strong and equal grip strength Sensation normal to light touch Moves extremities without ataxia, coordination intact  Psychiatric:        Mood and Affect: Mood normal.         Behavior: Behavior normal.    ED Treatments / Results  Labs (all labs ordered are listed, but only abnormal results are displayed) Labs Reviewed  CBC WITH DIFFERENTIAL/PLATELET - Abnormal; Notable for the following components:      Result Value   RBC 2.80 (*)    Hemoglobin 8.5 (*)    HCT 27.9 (*)    RDW 16.1 (*)    Platelets 149 (*)    Lymphs Abs 0.4 (*)    All other components within normal limits  COMPREHENSIVE METABOLIC PANEL - Abnormal; Notable for the following components:   Potassium 5.4 (*)    CO2 20 (*)    Glucose, Bld 101 (*)    BUN 95 (*)    Creatinine, Ser 4.46 (*)    Calcium 8.8 (*)    Albumin 3.0 (*)    AST 11 (*)    GFR calc non Af Amer 11 (*)    GFR calc Af Amer 12 (*)    All other components within normal limits  URINALYSIS, ROUTINE W REFLEX MICROSCOPIC - Abnormal; Notable for the following components:   Hgb urine dipstick SMALL (*)    Leukocytes, UA TRACE (*)    All other components within normal limits    EKG None  Radiology Ct Abdomen Pelvis Wo Contrast  Result Date: 11/15/2018 CLINICAL DATA:  85 year old male with right-sided groin pain for 2 days. EXAM: CT ABDOMEN AND PELVIS WITHOUT CONTRAST TECHNIQUE: Multidetector CT imaging of the abdomen and pelvis was performed following the standard protocol without IV contrast. COMPARISON:  08/01/2018 CT.  11/04/2018 renal sonogram. FINDINGS: Lower chest: Moderately large right-sided pleural effusion with adjacent compressive atelectasis. Pleural fluid may be partially loculated. Minimal scarring/basilar atelectasis left base. Mild cardiomegaly with pacemaker in place. Hepatobiliary: Taking into account limitation by non contrast imaging, no worrisome hepatic lesion. Scattered small calcifications. 6 mm calcified gallstone. No CT evidence of gallbladder inflammation. Pancreas: Taking into account limitation by non contrast imaging, no worrisome pancreatic mass or inflammation. Spleen: Taking into account  limitation by non contrast imaging, no splenic mass or enlargement. Adrenals/Urinary Tract: Mild adrenal gland hyperplasia. Slightly atrophic right kidney. No obstructing stone or hydronephrosis. Right renal cyst. Taking into account limitation by non contrast imaging, no worrisome adrenal lesion. Appearance of prior TURP. Bladder otherwise unremarkable. Stomach/Bowel: Prominent diverticulosis and muscular hypertrophy descending colon and sigmoid colon without extraluminal inflammation. Moderate stool throughout remainder of the colon. Appendix not visualized. Under distended stomach. No gross abnormality noted. No obvious small bowel abnormality. Vascular/Lymphatic: Prominent atherosclerotic changes aorta and aortic branch vessels. Lower abdominal aortic aneurysm with maximal transverse dimension of 3.5 cm without significant change. Scattered normal size lymph nodes. Reproductive: Prior TURP for prostatectomy. Other: No free air or bowel containing hernia. No right inguinal canal hernia. Mild third spacing of fluid.  No clear sacral decubitus. Musculoskeletal: Fusion sacroiliac joints. Partial fusion lumbar spine and lower thoracic spine. IMPRESSION: 1. No right inguinal canal hernia detected as cause of patient's right groin pain. Stool filled cecum extends towards the superior margin of the inguinal canal  but does not enter such. 2. Moderately large right-sided pleural effusion with adjacent compressive atelectasis. Pleural fluid may be partially loculated. Minimal scarring/basilar atelectasis left base. 3. Mild cardiomegaly with pacemaker in place. 4. Diverticulosis with prominent muscular hypertrophy descending colon and sigmoid colon. Moderate stool throughout remainder of colon. 5. 6 mm calcified gallstone without CT evidence of gallbladder inflammation. 6. Aortic Atherosclerosis (ICD10-I70.0). Lower abdominal aortic aneurysm with maximal transverse dimension of 3.5 cm. Recommend followup by ultrasound in 2  years. This recommendation follows ACR consensus guidelines: White Paper of the ACR Incidental Findings Committee II on Vascular Findings. J Am Coll Radiol 2013; 10:789-794. 7. Fusion sacroiliac joints. Partial fusion lumbar spine lower thoracic spine. Electronically Signed   By: Genia Del M.D.   On: 11/15/2018 18:04    Procedures Procedures (including critical care time)  Medications Ordered in ED Medications  sodium chloride 0.9 % bolus 500 mL (500 mLs Intravenous New Bag/Given 11/15/18 1709)     Initial Impression / Assessment and Plan / ED Course  I have reviewed the triage vital signs and the nursing notes.  Pertinent labs & imaging results that were available during my care of the patient were reviewed by me and considered in my medical decision making (see chart for details).     48:62 PM: 85 year old male presenting with intermittent right groin pain and bulging as well as with dysuria.  Examination today does not reveal hernia or frank lymphadenopathy, no abnormality on physical exam of the testicle or inguinal canal.  Suspect from history and physical examination today inguinal hernia that has self-reduced.  Will also assess for urinary tract infection.  Case discussed with Dr. Maryan Rued. ------------------------------- 4:30 PM: Patient also seen and evaluated by Dr. Maryan Rued who agrees with work-up at this time, she has also spoken with family members. ---------------------------------- CBC appears baseline with hemoglobin of 8.5 Urinalysis with trace leukocytes and small hemoglobin, culture sent CMP shows AKI, creatinine 4.46 this is increased from patient's baseline of 1.82, fluid bolus given CT abdomen pelvis without contrast with multiple incidental findings without explanation of patient's inguinal pain today. ------------------------------ Patient reevaluated, resting comfortably and in no acute distress.  Pain at this time is to admit patient for further evaluation and  treatment of his AKI.  Consult called to hospitalist service.  Case discussed with Dr. Baron Hamper who has admitted patient to her service for further treatment and evaluation.  Note: Portions of this report may have been transcribed using voice recognition software. Every effort was made to ensure accuracy; however, inadvertent computerized transcription errors may still be present. Final Clinical Impressions(s) / ED Diagnoses   Final diagnoses:  AKI (acute kidney injury) [facility])    ED Discharge Orders    None       Gari Crown 11/15/18 2042    Blanchie Dessert, MD 11/18/18 [phone]

## 2018-11-15 NOTE — ED Notes (Signed)
Bed: TT01 Expected date:  Expected time:  Means of arrival:  Comments: EMS-groin pain

## 2018-11-15 NOTE — ED Notes (Signed)
ED TO INPATIENT HANDOFF REPORT  Name/Age/Gender John Peck 85 y.o. male  Code Status Code Status History    Date Active Date Inactive Code Status Order ID Comments User Context   08/31/2018 0043 09/01/2018 1442 DNR [id number]  Vianne Bulls, MD ED   08/09/2018 1606 08/12/2018 1739 DNR [id number]  Janora Norlander, MD ED   07/29/2018 1441 08/02/2018 1933 DNR [id number]  Janora Norlander, MD ED   06/30/2018 0923 07/06/2018 1627 DNR [id number]  Alma Friendly, MD Inpatient   06/29/2018 1722 06/30/2018 0923 Full Code [id number]  Cristal Ford, DO ED   05/24/2018 1956 05/28/2018 0031 Full Code [id number]  Vianne Bulls, MD ED   03/14/2018 1500 03/19/2018 1917 Full Code [id number]  Samella Parr, NP ED   02/22/2018 0315 02/24/2018 2301 Full Code [id number]  Ivor Costa, MD ED   12/15/2017 1354 12/15/2017 1924 Full Code [id number]  Evans Lance, MD Inpatient   02/23/2017 1438 02/26/2017 1811 Full Code [id number]  Delos Haring, PA-C ED   11/29/2015 1545 11/30/2015 1531 Full Code [id number]  Burnell Blanks, MD Inpatient   11/28/2015 0229 11/29/2015 1545 Full Code [id number]  Toy Baker, MD Inpatient   05/09/2015 2146 05/11/2015 1611 Full Code [id number]  Ivor Costa, MD Inpatient   01/26/2014 0942 01/26/2014 1715 Full Code [id number]  Troy Sine, MD Inpatient    Questions for Most Recent Historical Code Status (Order [id number])    Question Answer Comment   In the event of cardiac or respiratory ARREST Do not call a "code blue"    In the event of cardiac or respiratory ARREST Do not perform Intubation, CPR, defibrillation or ACLS    In the event of cardiac or respiratory ARREST Use medication by any route, position, wound care, and other measures to relive pain and suffering. May use oxygen, suction and manual treatment of airway obstruction as needed for comfort.         Advance Directive Documentation     Most Recent Value  Type of Advance Directive  Healthcare Power of [place],  Living will [daughter is HCPOA Dorian [place] )]  Pre-existing out of facility DNR order (yellow form or pink MOST form)  -  "MOST" Form in Place?  -      Home/SNF/Other Skilled nursing facility  Chief Complaint groin pain  Level of Care/Admitting Diagnosis ED Disposition    ED Disposition Condition [place]: [place] [[id number]]  Level of Care: Med-Surg [16]  Diagnosis: Renal failure (ARF), acute on chronic [facility]) [[id number]]  Admitting Physician: Shelbie Proctor [[id number]]  Attending Physician: Shelbie Proctor [phone]  Estimated length of stay: past midnight tomorrow  Certification:: I certify this patient will need inpatient services for at least 2 midnights  PT Class (Do Not Modify): Inpatient [101]  PT Acc Code (Do Not Modify): Private [1]       Medical History Past Medical History:  Diagnosis Date  . Anemia   . Arthritis   . CAD (coronary artery disease)    a. CABG '89; b. PCI '04; c. 11/2015 Cath/PCI: LM 20ost, LAD 176m, LCX 30p, OM2 40, RCA 30p, 10p ISR, 90d (3.0x12 Synergy DES), AM 90, VG->OM2 known to be 100, LIMA->LAD not injected, patent in 2015.  . Carotid stenosis, right   . Chronic combined systolic and diastolic CHF (congestive heart failure) ([place])   . CKD (chronic kidney disease), stage III ([place])   . Dyspnea   . H/O:  GI bleed    REMOTE HISTORY  . History of COPD   . Hyperlipidemia   . Hypertensive heart disease   . LBBB (left bundle branch block)   . Pacemaker Sept 2009   St Jude  . PAF (paroxysmal atrial fibrillation) (HCC)    a. not on anticoag due to prior GIB.  . Sick sinus syndrome [facility]) Sept 2009   ST Jude PTVDP  . Stroke [facility])     Allergies No Known Allergies  IV Location/Drains/Wounds Patient Lines/Drains/Airways Status   Active Line/Drains/Airways    Name:   Placement date:   Placement time:   Site:   Days:   Peripheral IV 11/15/18 Left Antecubital   11/15/18    1601    Antecubital    less than 1          Labs/Imaging Results for orders placed or performed during the hospital encounter of 11/15/18 (from the past 48 hour(s))  CBC with Differential     Status: Abnormal   Collection Time: 11/15/18  4:03 PM  Result Value Ref Range   WBC 4.3 4.0 - 10.5 K/uL   RBC 2.80 (L) 4.22 - 5.81 MIL/uL   Hemoglobin 8.5 (L) 13.0 - 17.0 g/dL   HCT 27.9 (L) 39.0 - 52.0 %   MCV 99.6 80.0 - 100.0 fL   MCH 30.4 26.0 - 34.0 pg   MCHC 30.5 30.0 - 36.0 g/dL   RDW 16.1 (H) 11.5 - 15.5 %   Platelets 149 (L) 150 - 400 K/uL   nRBC 0.0 0.0 - 0.2 %   Neutrophils Relative % 70 %   Neutro Abs 3.0 1.7 - 7.7 K/uL   Lymphocytes Relative 10 %   Lymphs Abs 0.4 (L) 0.7 - 4.0 K/uL   Monocytes Relative 15 %   Monocytes Absolute 0.6 0.1 - 1.0 K/uL   Eosinophils Relative 4 %   Eosinophils Absolute 0.2 0.0 - 0.5 K/uL   Basophils Relative 0 %   Basophils Absolute 0.0 0.0 - 0.1 K/uL   Immature Granulocytes 1 %   Abs Immature Granulocytes 0.02 0.00 - 0.07 K/uL    Comment: Performed at [facility], [place] [address], [place], [place] [address]  Comprehensive metabolic panel     Status: Abnormal   Collection Time: 11/15/18  4:03 PM  Result Value Ref Range   Sodium 138 135 - 145 mmol/L   Potassium 5.4 (H) 3.5 - 5.1 mmol/L   Chloride 108 98 - 111 mmol/L   CO2 20 (L) 22 - 32 mmol/L   Glucose, Bld 101 (H) 70 - 99 mg/dL   BUN 95 (H) 8 - 23 mg/dL   Creatinine, Ser 4.46 (H) 0.61 - 1.24 mg/dL   Calcium 8.8 (L) 8.9 - 10.3 mg/dL   Total Protein 7.9 6.5 - 8.1 g/dL   Albumin 3.0 (L) 3.5 - 5.0 g/dL   AST 11 (L) 15 - 41 U/L   ALT 7 0 - 44 U/L   Alkaline Phosphatase 55 38 - 126 U/L   Total Bilirubin 0.8 0.3 - 1.2 mg/dL   GFR calc non Af Amer 11 (L) >60 mL/min   GFR calc Af Amer 12 (L) >60 mL/min   Anion gap 10 5 - 15    Comment: Performed at [facility], [place] [address] [place], [place] [address]  Urinalysis, Routine w reflex microscopic     Status: Abnormal    Collection Time: 11/15/18  4:31 PM  Result Value Ref Range  Color, Urine YELLOW YELLOW   APPearance CLEAR CLEAR   Specific Gravity, Urine 1.011 1.005 - 1.030   pH 5.0 5.0 - 8.0   Glucose, UA NEGATIVE NEGATIVE mg/dL   Hgb urine dipstick SMALL (A) NEGATIVE   Bilirubin Urine NEGATIVE NEGATIVE   Ketones, ur NEGATIVE NEGATIVE mg/dL   Protein, ur NEGATIVE NEGATIVE mg/dL   Nitrite NEGATIVE NEGATIVE   Leukocytes, UA TRACE (A) NEGATIVE   RBC / HPF 0-5 0 - 5 RBC/hpf   WBC, UA 11-20 0 - 5 WBC/hpf   Bacteria, UA NONE SEEN NONE SEEN   Squamous Epithelial / LPF 0-5 0 - 5    Comment: Performed at [facility], [place] [address], [place], [place] [address]   Ct Abdomen Pelvis Wo Contrast  Result Date: 11/15/2018 CLINICAL DATA:  85 year old male with right-sided groin pain for 2 days. EXAM: CT ABDOMEN AND PELVIS WITHOUT CONTRAST TECHNIQUE: Multidetector CT imaging of the abdomen and pelvis was performed following the standard protocol without IV contrast. COMPARISON:  08/01/2018 CT.  11/04/2018 renal sonogram. FINDINGS: Lower chest: Moderately large right-sided pleural effusion with adjacent compressive atelectasis. Pleural fluid may be partially loculated. Minimal scarring/basilar atelectasis left base. Mild cardiomegaly with pacemaker in place. Hepatobiliary: Taking into account limitation by non contrast imaging, no worrisome hepatic lesion. Scattered small calcifications. 6 mm calcified gallstone. No CT evidence of gallbladder inflammation. Pancreas: Taking into account limitation by non contrast imaging, no worrisome pancreatic mass or inflammation. Spleen: Taking into account limitation by non contrast imaging, no splenic mass or enlargement. Adrenals/Urinary Tract: Mild adrenal gland hyperplasia. Slightly atrophic right kidney. No obstructing stone or hydronephrosis. Right renal cyst. Taking into account limitation by non contrast imaging, no worrisome adrenal lesion. Appearance of prior  TURP. Bladder otherwise unremarkable. Stomach/Bowel: Prominent diverticulosis and muscular hypertrophy descending colon and sigmoid colon without extraluminal inflammation. Moderate stool throughout remainder of the colon. Appendix not visualized. Under distended stomach. No gross abnormality noted. No obvious small bowel abnormality. Vascular/Lymphatic: Prominent atherosclerotic changes aorta and aortic branch vessels. Lower abdominal aortic aneurysm with maximal transverse dimension of 3.5 cm without significant change. Scattered normal size lymph nodes. Reproductive: Prior TURP for prostatectomy. Other: No free air or bowel containing hernia. No right inguinal canal hernia. Mild third spacing of fluid.  No clear sacral decubitus. Musculoskeletal: Fusion sacroiliac joints. Partial fusion lumbar spine and lower thoracic spine. IMPRESSION: 1. No right inguinal canal hernia detected as cause of patient's right groin pain. Stool filled cecum extends towards the superior margin of the inguinal canal but does not enter such. 2. Moderately large right-sided pleural effusion with adjacent compressive atelectasis. Pleural fluid may be partially loculated. Minimal scarring/basilar atelectasis left base. 3. Mild cardiomegaly with pacemaker in place. 4. Diverticulosis with prominent muscular hypertrophy descending colon and sigmoid colon. Moderate stool throughout remainder of colon. 5. 6 mm calcified gallstone without CT evidence of gallbladder inflammation. 6. Aortic Atherosclerosis (ICD10-I70.0). Lower abdominal aortic aneurysm with maximal transverse dimension of 3.5 cm. Recommend followup by ultrasound in 2 years. This recommendation follows ACR consensus guidelines: White Paper of the ACR Incidental Findings Committee II on Vascular Findings. J Am Coll Radiol 2013; 10:789-794. 7. Fusion sacroiliac joints. Partial fusion lumbar spine lower thoracic spine. Electronically Signed   By: Genia Del M.D.   On: 11/15/2018  18:04   None  Pending Labs Unresulted Labs (From admission, onward)    Start     Ordered   11/15/18 1826  Urine culture  ONCE - STAT,  STAT     11/15/18 1825   Signed and Held  Basic metabolic panel  Tomorrow morning,   R     Signed and Held   Signed and Held  CBC  Tomorrow morning,   R     Signed and Held          Vitals/Pain Today's Vitals   11/15/18 1841 11/15/18 1900 11/15/18 1930 11/15/18 1935  BP: (!) 159/78 (!) 157/80 (!) 163/79   Pulse: 64 (!) 59  (!) 59  Resp: 17 16    Temp:      TempSrc:      SpO2: 100% 98%  99%    Isolation Precautions No active isolations  Medications Medications  cefTRIAXone (ROCEPHIN) 1 g in sodium chloride 0.9 % 100 mL IVPB (has no administration in time range)  sodium chloride 0.9 % bolus 500 mL (0 mLs Intravenous Stopped 11/15/18 1843)    Mobility non-ambulatory

## 2018-11-16 LAB — IRON AND TIBC
Iron: 43 ug/dL — ABNORMAL LOW (ref 45–182)
SATURATION RATIOS: 22 % (ref 17.9–39.5)
TIBC: 192 ug/dL — ABNORMAL LOW (ref 250–450)
UIBC: 149 ug/dL

## 2018-11-16 LAB — CBC
HCT: 25.9 % — ABNORMAL LOW (ref 39.0–52.0)
Hemoglobin: 8 g/dL — ABNORMAL LOW (ref 13.0–17.0)
MCH: 30.4 pg (ref 26.0–34.0)
MCHC: 30.9 g/dL (ref 30.0–36.0)
MCV: 98.5 fL (ref 80.0–100.0)
Platelets: 133 10*3/uL — ABNORMAL LOW (ref 150–400)
RBC: 2.63 MIL/uL — ABNORMAL LOW (ref 4.22–5.81)
RDW: 16.2 % — ABNORMAL HIGH (ref 11.5–15.5)
WBC: 3.6 10*3/uL — AB (ref 4.0–10.5)
nRBC: 0 % (ref 0.0–0.2)

## 2018-11-16 LAB — BASIC METABOLIC PANEL
Anion gap: 9 (ref 5–15)
BUN: 85 mg/dL — AB (ref 8–23)
CO2: 16 mmol/L — ABNORMAL LOW (ref 22–32)
Calcium: 8.7 mg/dL — ABNORMAL LOW (ref 8.9–10.3)
Chloride: 113 mmol/L — ABNORMAL HIGH (ref 98–111)
Creatinine, Ser: 4.03 mg/dL — ABNORMAL HIGH (ref 0.61–1.24)
GFR calc Af Amer: 14 mL/min — ABNORMAL LOW (ref >60)
GFR, EST NON AFRICAN AMERICAN: 12 mL/min — AB (ref >60)
Glucose, Bld: 83 mg/dL (ref 70–99)
Potassium: 4.9 mmol/L (ref 3.5–5.1)
Sodium: 138 mmol/L (ref 135–145)

## 2018-11-16 LAB — MRSA PCR SCREENING: MRSA by PCR: NEGATIVE

## 2018-11-16 LAB — FERRITIN: Ferritin: 419 ng/mL — ABNORMAL HIGH (ref 24–336)

## 2018-11-16 LAB — SODIUM, URINE, RANDOM: Sodium, Ur: 99 mmol/L

## 2018-11-16 LAB — FOLATE: Folate: 6.4 ng/mL (ref >5.9)

## 2018-11-16 LAB — RETICULOCYTES
Immature Retic Fract: 9.5 % (ref 2.3–15.9)
RBC.: 2.61 MIL/uL — ABNORMAL LOW (ref 4.22–5.81)
Retic Count, Absolute: 50.1 10*3/uL (ref 19.0–186.0)
Retic Ct Pct: 1.9 % (ref 0.4–3.1)

## 2018-11-16 LAB — CREATININE, URINE, RANDOM: Creatinine, Urine: 39.1 mg/dL

## 2018-11-16 LAB — VITAMIN B12: Vitamin B-12: 368 pg/mL (ref 180–914)

## 2018-11-16 MED ORDER — POLYETHYLENE GLYCOL 3350 17 G PO PACK
17.0000 g | PACK | Freq: Every day | ORAL | Status: DC
  Filled 2018-11-16: qty 1

## 2018-11-16 MED ORDER — SODIUM CHLORIDE 0.9 % IV SOLN
INTRAVENOUS | Status: DC
  Administered 2018-11-16 – 2018-11-17 (×2): via INTRAVENOUS

## 2018-11-16 MED ORDER — BISACODYL 10 MG RE SUPP
10.0000 mg | Freq: Every day | RECTAL | Status: DC
  Filled 2018-11-16: qty 1

## 2018-11-16 MED ORDER — POLYETHYLENE GLYCOL 3350 17 G PO PACK: 17.0000 g | PACK | Freq: Two times a day (BID) | ORAL | Status: DC

## 2018-11-16 NOTE — NC FL2 (Signed)
[place] LEVEL OF CARE SCREENING TOOL     IDENTIFICATION  Patient Name: John Peck Birthdate: May 25, 1924 Sex: male Admission Date (Current Location): 11/15/2018  [facility] and [place] Number:  Herbalist and Address:  [facility],  [place], [place]      Provider Number: [id number]  Attending Physician Name and Address:  Eugenie Filler, MD  Relative Name and Phone Number:       Current Level of Care: Hospital Recommended Level of Care: [place] Prior Approval Number:    Date Approved/Denied:   PASRR Number: [id number]  Discharge Plan: SNF    Current Diagnoses: Patient Active Problem List   Diagnosis Date Noted  . Renal failure (ARF), acute on chronic (HCC) 11/15/2018  . Constipation 11/15/2018  . Pyuria 11/15/2018  . Dementia ([place]) 11/15/2018  . Dysphagia   . AKI (acute kidney injury) ([place])   . Altered mental status 08/09/2018  . Delirium 08/09/2018  . UTI (urinary tract infection) 08/09/2018  . Right flank pain   . Goals of care, counseling/discussion   . Palliative care encounter   . Chest pain 07/29/2018  . TIA (transient ischemic attack) 06/29/2018  . Pressure injury of skin 05/25/2018  . Pleural effusion 05/24/2018  . Acute respiratory failure with hypoxia ([place]) 05/24/2018  . NSTEMI (non-ST elevated myocardial infarction) ([place])   . Chronic systolic CHF (congestive heart failure) ([place]) 03/14/2018  . COPD clinical dx/ no pfts on record 03/14/2018  . H/O: GI bleed 03/14/2018  . CKD (chronic kidney disease), stage III ([place]) 03/14/2018  . Macrocytic anemia 03/14/2018  . Paroxysmal atrial fibrillation ([place]) 03/14/2018  . Hypothyroidism 03/14/2018  . Carotid stenosis, right 03/14/2018  . Facial droop 02/22/2018  . Pancytopenia ([place]) 02/22/2018  . Generalized weakness   . Hypertensive heart disease   . Hyperlipidemia   . Stented coronary artery   . Coronary artery disease  involving native coronary artery of native heart with unstable angina pectoris ([place])   . Reactive airway disease 11/27/2015  . CAD in native artery 09/03/2015  . Weakness 05/09/2015  . Hyperlipidemia LDL goal <70 02/28/2015  . Atherosclerosis of lower extremity with claudication ([place]) 07/17/2014  . PAF (paroxysmal atrial fibrillation) ([place]) 03/24/2013  . Lower extremity edema 03/24/2013  . Sinoatrial node dysfunction ([place]) 08/17/2012  . History of iron deficiency anemia 09/18/2011  . Essential hypertension 07/07/2009  . INTERMEDIATE CORONARY SYNDROME 07/07/2009  . S/P CABG x 2 '89. RCA stent'04. last cath Jan 2013 07/07/2009  . PACEMAKER, PERMANENT- St Jude Sept 2009 07/07/2009  . Sick sinus syndrome ([place]) 07/10/2008  . Pacemaker 07/10/2008    Orientation RESPIRATION BLADDER Height & Weight     Self, Time, Situation, Place  Normal Continent Weight: 149 lb (67.6 kg) Height:  5\' 9"  (175.3 cm)  BEHAVIORAL SYMPTOMS/MOOD NEUROLOGICAL BOWEL NUTRITION STATUS      Continent Diet(See dc summary)  AMBULATORY STATUS COMMUNICATION OF NEEDS Skin   Extensive Assist Verbally Normal                       Personal Care Assistance Level of Assistance  Bathing, Feeding, Dressing Bathing Assistance: Maximum assistance Feeding assistance: Limited assistance Dressing Assistance: Maximum assistance     Functional Limitations Info  Sight, Hearing, Speech Sight Info: Impaired(wears glasses) Hearing Info: Impaired Speech Info: Adequate    SPECIAL CARE FACTORS FREQUENCY  Contractures Contractures Info: Not present    Additional Factors Info  Code Status, Allergies Code Status Info: DNR Allergies Info: NKA           Current Medications (11/16/2018):  This is the current hospital active medication list Current Facility-Administered Medications  Medication Dose Route Frequency Provider Last Rate Last Dose  . acetaminophen (TYLENOL) tablet 650 mg  650 mg  Oral Q6H PRN Johnson-Pitts, Endia, MD       Or  . acetaminophen (TYLENOL) suppository 650 mg  650 mg Rectal Q6H PRN Johnson-Pitts, Endia, MD      . acetaminophen (TYLENOL) tablet 650 mg  650 mg Oral TID Johnson-Pitts, Endia, MD   650 mg at 11/15/18 2256  . albuterol (PROVENTIL) (2.5 MG/3ML) 0.083% nebulizer solution 2.5 mg  2.5 mg Inhalation Q4H PRN Johnson-Pitts, Endia, MD      . aspirin chewable tablet 81 mg  81 mg Oral Daily Johnson-Pitts, Endia, MD      . atorvastatin (LIPITOR) tablet 40 mg  40 mg Oral QHS Johnson-Pitts, Endia, MD   40 mg at 11/15/18 2256  . bisacodyl (DULCOLAX) EC tablet 5 mg  5 mg Oral Daily PRN Johnson-Pitts, Endia, MD      . bisacodyl (DULCOLAX) suppository 10 mg  10 mg Rectal Daily Eugenie Filler, MD      . carvedilol (COREG) tablet 12.5 mg  12.5 mg Oral BID WC Johnson-Pitts, Endia, MD   12.5 mg at 11/16/18 0820  . cefTRIAXone (ROCEPHIN) 1 g in sodium chloride 0.9 % 100 mL IVPB  1 g Intravenous Q24H Johnson-Pitts, Endia, MD 200 mL/hr at 11/15/18 2049 1 g at 11/15/18 2049  . clopidogrel (PLAVIX) tablet 75 mg  75 mg Oral Q breakfast Johnson-Pitts, Endia, MD   75 mg at 11/16/18 0820  . ezetimibe (ZETIA) tablet 10 mg  10 mg Oral Daily Johnson-Pitts, Endia, MD      . feeding supplement (ENSURE ENLIVE) (ENSURE ENLIVE) liquid 237 mL  237 mL Oral BID BM Johnson-Pitts, Endia, MD      . ferrous sulfate tablet 325 mg  325 mg Oral BID WC Johnson-Pitts, Endia, MD   325 mg at 11/16/18 0820  . hydrocortisone cream 1 %   Topical BID Johnson-Pitts, Endia, MD      . ipratropium-albuterol (DUONEB) 0.5-2.5 (3) MG/3ML nebulizer solution 3 mL  3 mL Nebulization BID Johnson-Pitts, Endia, MD   3 mL at 11/15/18 2149  . isosorbide mononitrate (IMDUR) 24 hr tablet 90 mg  90 mg Oral Daily Johnson-Pitts, Endia, MD      . latanoprost (XALATAN) 0.005 % ophthalmic solution 1 drop  1 drop Both Eyes Once per day on Mon Wed Johnson-Pitts, Endia, MD      . levothyroxine (SYNTHROID, LEVOTHROID) tablet  125 mcg  125 mcg Oral QAC breakfast Johnson-Pitts, Endia, MD   125 mcg at 11/16/18 0542  . Melatonin TABS 3 mg  3 mg Oral QHS Johnson-Pitts, Endia, MD   3 mg at 11/15/18 2255  . nitroGLYCERIN (NITROSTAT) SL tablet 0.4 mg  0.4 mg Sublingual Q5 min PRN Johnson-Pitts, Endia, MD      . ondansetron (ZOFRAN) tablet 4 mg  4 mg Oral Q6H PRN Johnson-Pitts, Endia, MD       Or  . ondansetron (ZOFRAN) injection 4 mg  4 mg Intravenous Q6H PRN Johnson-Pitts, Endia, MD      . pantoprazole (PROTONIX) EC tablet 40 mg  40 mg Oral Daily Johnson-Pitts, Endia, MD   40 mg at 11/15/18 2258  .  phenylephrine ((USE for PREPARATION-H)) 0.25 % suppository 1 suppository  1 suppository Rectal PRN Johnson-Pitts, Endia, MD      . polyethylene glycol (MIRALAX / GLYCOLAX) packet 17 g  17 g Oral BID Eugenie Filler, MD      . ranolazine (RANEXA) 12 hr tablet 500 mg  500 mg Oral BID Johnson-Pitts, Endia, MD   500 mg at 11/15/18 2256  . tamsulosin (FLOMAX) capsule 0.4 mg  0.4 mg Oral Daily Johnson-Pitts, Endia, MD         Discharge Medications: Please see discharge summary for a list of discharge medications.  Relevant Imaging Results:  Relevant Lab Results:   Additional Information SS# [id number]  Servando Snare, LCSW

## 2018-11-16 NOTE — Progress Notes (Signed)
PROGRESS NOTE    John Peck  BOF:[id number] DOB: Aug 02, 1924 DOA: 11/15/2018 PCP: Seward Carol, MD    Brief Narrative:  Per Dr. Wynetta Emery- John Peck is a 85 y.o. male with medical history significant of chronic kidney disease, CHF, chronic anemia presented with right-sided inguinal hernia complaints.  It is been hurting him over the past 2 weeks.  Has been treated supportively at the nursing facility with Tylenol and ibuprofen.  Was no inguinal hernia on my or ER clinicians physical exam.  It was also not seen on abdominal pelvic CT.  Patient denied any chest pain shortness of breath, or  fever.  He does follow a fluid restricted diet at the facility.  He also eats a mechanical soft diet, and  was not eating very well so family has been giving him additional food to entice him to eat as well.  Patient was unsure if he has had a urinary changes.  Had a bowel movements morning but it was small. ED Course: Patient CT scan of abdomen and pelvis that did not show any inguinal hernia.  Also did not show any stones or hydronephrosis.  Patient does have a chronic right-sided pleural effusion that was present again today.  He was found to have acute on chronic renal failure with a creatinine of 4.4 with prior being 1.8.  He also had some white cells in his urine and has had previous UTI in the past.   Assessment & Plan:   Principal Problem:   Renal failure (ARF), acute on chronic (HCC) Active Problems:   PAF (paroxysmal atrial fibrillation) (HCC)   CAD in native artery   Chronic systolic CHF (congestive heart failure) (HCC)   Hypothyroidism   Pleural effusion   Constipation   Pyuria   Dementia (HCC)  1 acute on chronic renal failure stage III Questionable etiology.  Likely secondary to prerenal azotemia in the setting of diuretics, NSAID use.  Level 99.  Urine creatinine of 39.10.  UA trace leukocytes nitrite -11-20 WBCs.  Urine cultures pending.  CT abdomen and pelvis negative  for obstruction or hydronephrosis.  Continue IV Rocephin.  Continue IV fluids.  4.03 from 4.46 on admission.  Last creatinine was 1.82 on 09/01/2018.  Patient with a urine output of 550 cc since admission.  Continue strict I's and O's.  Daily weights.  Continue IV fluids.  Hold diuretics.  No NSAIDs.  Discontinue Ranexa.  Follow.  2.  Paroxysmal atrial fibrillation Continue Coreg for rate control.  On aspirin and Plavix.  Follow.  3.  Chronic systolic heart failure/CAD Currently stable.  Patient is euvolemic.  Continue Coreg, aspirin, Plavix, Imdur, Lipitor.  Diuretics on hold secondary to problem #1.  Monitor closely for volume overload with hydration.  4.  Hypothyroidism Continue home dose Synthroid.  5.  Constipation Likely etiology of patient's abdominal pain and concern for inguinal hernia.  CT abdomen and pelvis negative for hernia.  Patient with large bowel movements per RN.  Lee Dulcolax suppositories.  Change MiraLAX to 17 g daily.  Follow.  6.  Chronic pleural effusion Stable.  Monitor closely with volume overload.  7.  Dementia Stable.  8.  Pyuria Urine cultures pending.  Continue IV Rocephin.    9 chronic anemia Hemoglobin currently at 8.0 from 8.5.  Likely dilutional.  Check an anemia panel.  Monitor closely.  Transfusion threshold hemoglobin less than 7.   DVT prophylaxis: scd Code Status: DNR Family Communication: updated daughter. Disposition Plan: Back to  SNF once renal function back to baseline.   Consultants:   None  Procedures:   CT abd/pelvis 11/15/2018.  Antimicrobials:  IV Rocephin 11/15/2018   Subjective: Per RN patient with large bowel movement last night and 2 large bowel movements this morning.  Patient denies any chest pain or shortness of breath.  Denies any abdominal pain that he had presented to the ED with.  Objective: Vitals:   11/15/18 2110 11/15/18 2149 11/16/18 0500 11/16/18 0544  BP: (!) 170/88   (!) 149/69  Pulse: 60   (!) 58    Resp: 19   16  Temp: (!) 96.8 F (36 C)   (!) 97.3 F (36.3 C)  TempSrc:      SpO2: 100% 99%  99%  Weight: 67.1 kg  67.6 kg   Height: 5\' 9"  (1.753 m)       Intake/Output Summary (Last 24 hours) at 11/16/2018 1110 Last data filed at 11/16/2018 0837 Gross per 24 hour  Intake 1168.5 ml  Output 850 ml  Net 318.5 ml   Filed Weights   11/15/18 2110 11/16/18 0500  Weight: 67.1 kg 67.6 kg    Examination:  General exam: Appears calm and comfortable  Respiratory system: Clear to auscultation. Respiratory effort normal. Cardiovascular system: S1 & S2 heard, RRR. No JVD, murmurs, rubs, gallops or clicks. No pedal edema. Gastrointestinal system: Abdomen is nondistended, soft and nontender. No organomegaly or masses felt. Normal bowel sounds heard. Central nervous system: Alert and oriented. No focal neurological deficits. Extremities: Symmetric 5 x 5 power. Skin: No rashes, lesions or ulcers Psychiatry: Judgement and insight appear normal. Mood & affect appropriate.     Data Reviewed: I have personally reviewed following labs and imaging studies  CBC: Recent Labs  Lab 11/15/18 1603 11/16/18 0559  WBC 4.3 3.6*  NEUTROABS 3.0  --   HGB 8.5* 8.0*  HCT 27.9* 25.9*  MCV 99.6 98.5  PLT 149* 412*   Basic Metabolic Panel: Recent Labs  Lab 11/15/18 1603 11/16/18 0559  NA 138 138  K 5.4* 4.9  CL 108 113*  CO2 20* 16*  GLUCOSE 101* 83  BUN 95* 85*  CREATININE 4.46* 4.03*  CALCIUM 8.8* 8.7*   GFR: Estimated Creatinine Clearance: 11.2 mL/min (A) (by C-G formula based on SCr of 4.03 mg/dL (H)). Liver Function Tests: Recent Labs  Lab 11/15/18 1603  AST 11*  ALT 7  ALKPHOS 55  BILITOT 0.8  PROT 7.9  ALBUMIN 3.0*   No results for input(s): LIPASE, AMYLASE in the last 168 hours. No results for input(s): AMMONIA in the last 168 hours. Coagulation Profile: No results for input(s): INR, PROTIME in the last 168 hours. Cardiac Enzymes: No results for input(s): CKTOTAL,  CKMB, CKMBINDEX, TROPONINI in the last 168 hours. BNP (last 3 results) No results for input(s): PROBNP in the last 8760 hours. HbA1C: No results for input(s): HGBA1C in the last 72 hours. CBG: No results for input(s): GLUCAP in the last 168 hours. Lipid Profile: No results for input(s): CHOL, HDL, LDLCALC, TRIG, CHOLHDL, LDLDIRECT in the last 72 hours. Thyroid Function Tests: No results for input(s): TSH, T4TOTAL, FREET4, T3FREE, THYROIDAB in the last 72 hours. Anemia Panel: Recent Labs    11/16/18 0559 11/16/18 0952  VITAMINB12  --  368  FOLATE  --  6.4  FERRITIN  --  419*  TIBC  --  192*  IRON  --  43*  RETICCTPCT 1.9  --    Sepsis Labs: No results for  input(s): PROCALCITON, LATICACIDVEN in the last 168 hours.  Recent Results (from the past 240 hour(s))  MRSA PCR Screening     Status: None   Collection Time: 11/15/18 11:15 PM  Result Value Ref Range Status   MRSA by PCR NEGATIVE NEGATIVE Final    Comment:        The GeneXpert MRSA Assay (FDA approved for NASAL specimens only), is one component of a comprehensive MRSA colonization surveillance program. It is not intended to diagnose MRSA infection nor to guide or monitor treatment for MRSA infections. Performed at [facility], [place] [address], [place], [place] [address]          Radiology Studies: Ct Abdomen Pelvis Wo Contrast  Result Date: 11/15/2018 CLINICAL DATA:  85 year old male with right-sided groin pain for 2 days. EXAM: CT ABDOMEN AND PELVIS WITHOUT CONTRAST TECHNIQUE: Multidetector CT imaging of the abdomen and pelvis was performed following the standard protocol without IV contrast. COMPARISON:  08/01/2018 CT.  11/04/2018 renal sonogram. FINDINGS: Lower chest: Moderately large right-sided pleural effusion with adjacent compressive atelectasis. Pleural fluid may be partially loculated. Minimal scarring/basilar atelectasis left base. Mild cardiomegaly with pacemaker in place.  Hepatobiliary: Taking into account limitation by non contrast imaging, no worrisome hepatic lesion. Scattered small calcifications. 6 mm calcified gallstone. No CT evidence of gallbladder inflammation. Pancreas: Taking into account limitation by non contrast imaging, no worrisome pancreatic mass or inflammation. Spleen: Taking into account limitation by non contrast imaging, no splenic mass or enlargement. Adrenals/Urinary Tract: Mild adrenal gland hyperplasia. Slightly atrophic right kidney. No obstructing stone or hydronephrosis. Right renal cyst. Taking into account limitation by non contrast imaging, no worrisome adrenal lesion. Appearance of prior TURP. Bladder otherwise unremarkable. Stomach/Bowel: Prominent diverticulosis and muscular hypertrophy descending colon and sigmoid colon without extraluminal inflammation. Moderate stool throughout remainder of the colon. Appendix not visualized. Under distended stomach. No gross abnormality noted. No obvious small bowel abnormality. Vascular/Lymphatic: Prominent atherosclerotic changes aorta and aortic branch vessels. Lower abdominal aortic aneurysm with maximal transverse dimension of 3.5 cm without significant change. Scattered normal size lymph nodes. Reproductive: Prior TURP for prostatectomy. Other: No free air or bowel containing hernia. No right inguinal canal hernia. Mild third spacing of fluid.  No clear sacral decubitus. Musculoskeletal: Fusion sacroiliac joints. Partial fusion lumbar spine and lower thoracic spine. IMPRESSION: 1. No right inguinal canal hernia detected as cause of patient's right groin pain. Stool filled cecum extends towards the superior margin of the inguinal canal but does not enter such. 2. Moderately large right-sided pleural effusion with adjacent compressive atelectasis. Pleural fluid may be partially loculated. Minimal scarring/basilar atelectasis left base. 3. Mild cardiomegaly with pacemaker in place. 4. Diverticulosis with  prominent muscular hypertrophy descending colon and sigmoid colon. Moderate stool throughout remainder of colon. 5. 6 mm calcified gallstone without CT evidence of gallbladder inflammation. 6. Aortic Atherosclerosis (ICD10-I70.0). Lower abdominal aortic aneurysm with maximal transverse dimension of 3.5 cm. Recommend followup by ultrasound in 2 years. This recommendation follows ACR consensus guidelines: White Paper of the ACR Incidental Findings Committee II on Vascular Findings. J Am Coll Radiol 2013; 10:789-794. 7. Fusion sacroiliac joints. Partial fusion lumbar spine lower thoracic spine. Electronically Signed   By: Genia Del M.D.   On: 11/15/2018 18:04        Scheduled Meds: . acetaminophen  650 mg Oral TID  . aspirin  81 mg Oral Daily  . atorvastatin  40 mg Oral QHS  . bisacodyl  10 mg Rectal Daily  .  carvedilol  12.5 mg Oral BID WC  . clopidogrel  75 mg Oral Q breakfast  . ezetimibe  10 mg Oral Daily  . feeding supplement (ENSURE ENLIVE)  237 mL Oral BID BM  . ferrous sulfate  325 mg Oral BID WC  . hydrocortisone cream   Topical BID  . ipratropium-albuterol  3 mL Nebulization BID  . isosorbide mononitrate  90 mg Oral Daily  . latanoprost  1 drop Both Eyes Once per day on Mon Wed  . levothyroxine  125 mcg Oral QAC breakfast  . Melatonin  3 mg Oral QHS  . pantoprazole  40 mg Oral Daily  . [START ON 11/17/2018] polyethylene glycol  17 g Oral Daily  . ranolazine  500 mg Oral BID  . tamsulosin  0.4 mg Oral Daily   Continuous Infusions: . sodium chloride    . cefTRIAXone (ROCEPHIN)  IV 1 g (11/15/18 2049)     LOS: 1 day    Time spent: 35 minutes    Irine Seal, MD [facility] Pager [phone]  If 7PM-7AM, please contact night-coverage [email or web address] Password [email or web address] 11/16/2018, 11:10 AM

## 2018-11-16 NOTE — Evaluation (Signed)
Clinical/Bedside Swallow Evaluation Patient Details  Name: John Peck MRN: [id number] Date of Birth: [birth year]  Today's Date: 11/16/2018 Time: SLP Start Time (ACUTE ONLY): 0846 SLP Stop Time (ACUTE ONLY): 0901 SLP Time Calculation (min) (ACUTE ONLY): 15 min  Past Medical History:  Past Medical History:  Diagnosis Date  . Anemia   . Arthritis   . CAD (coronary artery disease)    a. CABG '89; b. PCI '04; c. 11/2015 Cath/PCI: LM 20ost, LAD 143m, LCX 30p, OM2 40, RCA 30p, 10p ISR, 90d (3.0x12 Synergy DES), AM 90, VG->OM2 known to be 100, LIMA->LAD not injected, patent in 2015.  . Carotid stenosis, right   . Chronic combined systolic and diastolic CHF (congestive heart failure) ([place])   . CKD (chronic kidney disease), stage III ([place])   . Dyspnea   . H/O: GI bleed    REMOTE HISTORY  . History of COPD   . Hyperlipidemia   . Hypertensive heart disease   . LBBB (left bundle branch block)   . Pacemaker Sept 2009   St Jude  . PAF (paroxysmal atrial fibrillation) (HCC)    a. not on anticoag due to prior GIB.  . Sick sinus syndrome [facility]) Sept 2009   ST Jude PTVDP  . Stroke [facility])    Past Surgical History:  Past Surgical History:  Procedure Laterality Date  . CARDIAC CATHETERIZATION  11/2011   EF 50%; significant native CAD w/80% stenosis of the LAD after 2nd giagonal vessel and septal perforating artery w/total occlusion of the mid left anterior descendiung.; 60-70% ostiasl stenosis in the circumflex vessel followed by 40% proximal stenosis and 70-80% distal circumflex stenosis;   . CARDIAC CATHETERIZATION N/A 11/29/2015   Procedure: Left Heart Cath and Cors/Grafts Angiography;  Surgeon: Burnell Blanks, MD;  Location: [place] CV LAB;  Service: Cardiovascular;  Laterality: N/A;  . CARDIAC CATHETERIZATION N/A 11/29/2015   Procedure: Coronary Stent Intervention;  Surgeon: Burnell Blanks, MD;  Location: [place] CV LAB;  Service: Cardiovascular;  Laterality: N/A;  .  CATARACT EXTRACTION  2004  . CORONARY ANGIOGRAPHY N/A 02/25/2017   Procedure: Coronary Angiography;  Surgeon: Belva Crome, MD;  Location: [place] CV LAB;  Service: Cardiovascular;  Laterality: N/A;  . CORONARY ANGIOPLASTY WITH STENT PLACEMENT  2004   RCA  . CORONARY ARTERY BYPASS GRAFT  1989   had LIMA to his LAD, a vein to the circumflex. In 2004 underwent stenting to his right coronary artery.  . CORONARY STENT INTERVENTION N/A 02/24/2017   Procedure: Coronary Stent Intervention;  Surgeon: Wellington Hampshire, MD;  Location: [place] CV LAB;  Service: Cardiovascular;  Laterality: N/A;  cfx  . HEMORRHOID SURGERY    . INSERT / REPLACE / REMOVE PACEMAKER  07/17/08   DUAL-CHAMBER; PPM-ST.JUDE MEDNET  . IR THORACENTESIS ASP PLEURAL SPACE W/IMG GUIDE  05/25/2018  . IR THORACENTESIS ASP PLEURAL SPACE W/IMG GUIDE  08/01/2018  . LEFT HEART CATH AND CORS/GRAFTS ANGIOGRAPHY N/A 02/24/2017   Procedure: Left Heart Cath and Cors/Grafts Angiography;  Surgeon: Wellington Hampshire, MD;  Location: [place] CV LAB;  Service: Cardiovascular;  Laterality: N/A;  . LEFT HEART CATHETERIZATION WITH CORONARY/GRAFT ANGIOGRAM N/A 12/10/2011   Procedure: LEFT HEART CATHETERIZATION WITH Beatrix Fetters;  Surgeon: Troy Sine, MD;  Location: [facility] CATH LAB;  Service: Cardiovascular;  Laterality: N/A;  . LEFT HEART CATHETERIZATION WITH CORONARY/GRAFT ANGIOGRAM N/A 01/26/2014   Procedure: LEFT HEART CATHETERIZATION WITH Beatrix Fetters;  Surgeon: Troy Sine, MD;  Location:  [place] CATH LAB;  Service: Cardiovascular;  Laterality: N/A;  . PPM GENERATOR CHANGEOUT N/A 12/15/2017   Procedure: PPM GENERATOR CHANGEOUT;  Surgeon: Evans Lance, MD;  Location: [place] CV LAB;  Service: Cardiovascular;  Laterality: N/A;  . PROSTATECTOMY     HPI:  85 yo male adm to [facility] with groin pan, dysuria.  Pt PMH + for COPD, CVA, prior smoker, sick sinus syndrome, anemia.  Pt has been seen previously for swallow evaluation -  undergoing MBS 03/2018 and 09/2018 with recommendation for regular/thin diet at that time.  Per notes, he has been on a dys3 diet at facility and intake is poor.  CXR showed moderate pleural effusion right sided with adjacent compression.     Assessment / Plan / Recommendation Clinical Impression  Pt familiar to this SLP from prior admission.  Currently he presents with functional oropharyngeal swallow based on clinical swallow evaluation.  He demonstrates adequate mastication with no residuals and eats slowly feeding himself which will maximize airway protection.  3 ounce water test passed easily with pt using straw.  Pt did request meats to be chopped up and when SLP asked why intake is poor at facility, pt states there is not "seasoning or salt" thus doesn't taste good.  Does not state consistency is issue.   SLP Visit Diagnosis: Dysphagia, unspecified (R13.10)    Aspiration Risk  No limitations;Mild aspiration risk    Diet Recommendation Regular;Thin liquid   Liquid Administration via: Cup;Straw Medication Administration: Whole meds with puree Supervision: Patient able to self feed Compensations: Slow rate;Small sips/bites Postural Changes: Seated upright at 90 degrees;Remain upright for at least 30 minutes after po intake    Other  Recommendations Oral Care Recommendations: Oral care BID   Follow up Recommendations None      Frequency and Duration   n/a         Prognosis    n/a    Swallow Study   General HPI: 85 yo male adm to [facility] with groin pan, dysuria.  Pt PMH + for COPD, CVA, prior smoker, sick sinus syndrome, anemia.  Pt has been seen previously for swallow evaluation - undergoing MBS 03/2018 and 09/2018 with recommendation for regular/thin diet at that time.  Per notes, he has been on a dys3 diet at facility and intake is poor.  CXR showed moderate pleural effusion right sided with adjacent compression.      Oral/Motor/Sensory Function Overall Oral Motor/Sensory Function:  Within functional limits   Ice Chips Ice chips: Not tested   Thin Liquid Thin Liquid: Within functional limits Presentation: Straw;Cup Other Comments: pt passed 3 ounce water test without difficulties    Nectar Thick Nectar Thick Liquid: Not tested   Honey Thick Honey Thick Liquid: Not tested   Puree Puree: Not tested Other Comments: pt declined to consume more oatmeal from breakfast   Solid     Solid: Within functional limits Presentation: Self Fed Other Comments: pt with adequate mastication with no residuals, he eats slowly and feeds himself which will maximize airway protection      Macario Golds 11/16/2018,9:15 AM   Luanna Salk, MS [place] Pager [phone] Office [phone]

## 2018-11-16 NOTE — Clinical Social Work Note (Signed)
Clinical Social Work Assessment  Patient Details  Name: John Peck MRN: [id number] Date of Birth: 06/11/1924  Date of referral:  11/16/18               Reason for consult:  Discharge Planning                Permission sought to share information with:  Facility Sport and exercise psychologist, Family Supports Permission granted to share information::  Yes, Verbal Permission Granted  Name::     Daughter, Chief Operating Officer::  [place]  Relationship::     Contact Information:     Housing/Transportation Living arrangements for the past 2 months:  [place] of Information:  Patient, Adult Children Patient Interpreter Needed:  None Criminal Activity/Legal Involvement Pertinent to Current Situation/Hospitalization:  No - Comment as needed Significant Relationships:  Adult Children, Warehouse manager Lives with:  Facility Resident Do you feel safe going back to the place where you live?  Yes Need for family participation in patient care:  Yes (Comment)  Care giving concerns:  No care giving concerns at the time of assessment.   John Peck is a 85 y.o. male with medical history significant of chronic kidney disease, CHF, chronic anemia presents with right-sided inguinal hernia complaints.  It is been hurting him over the past 2 weeks.  Has been treated supportively at the nursing facility with Tylenol and ibuprofen.  Was no inguinal hernia on my or ER clinicians physical exam.  It was also not seen on abdominal pelvic CT.  Patient denies any chest pain shortness of breath, or  fever.  He does follow a fluid restricted diet at the facility.  He also eats a mechanical soft diet, and  was not eating very well so family has been giving him additional food to entice him to eat as well.  Patient is unsure if he has had a urinary changes.  Had a bowel movements morning but it was small.   Social Worker assessment / plan:  LCSW consulted for discharge planning. Patient is a LTC  resident at [facility].   According to patients daughter, John Peck patient has been at [facility] since July 2019. Patient uses a wheel chair at baseline and requires assistance with ADLs. Daughter reports patient is able to walk with assistance and does so with PT at the facility.   PLAN: pt will return to [place] [facility] at [place]. Patient will need PTAR for transport.   Employment status:  Retired Nurse, adult PT Recommendations:  Not assessed at this time Information / Referral to community resources:     Patient/Family's Response to care:  Thankful for LCSW visit and coordination with dc planning.   Patient/Family's Understanding of and Emotional Response to Diagnosis, Current Treatment, and Prognosis:  Unable to assess. Patient workup still in progress.   Emotional Assessment Appearance:  Appears stated age Attitude/Demeanor/Rapport:  Engaged Affect (typically observed):  Pleasant Orientation:  Oriented to Self, Oriented to Place, Oriented to  Time, Oriented to Situation Alcohol / Substance use:  Not Applicable Psych involvement (Current and /or in the community):     Discharge Needs  Concerns to be addressed:  No discharge needs identified Readmission within the last 30 days:  No Current discharge risk:  None Barriers to Discharge:  Continued Medical Work up   [employer], LCSW 11/16/2018, 10:06 AM

## 2018-11-17 DIAGNOSIS — E872 Acidosis, unspecified: Secondary | ICD-10-CM | POA: Diagnosis present

## 2018-11-17 LAB — RENAL FUNCTION PANEL
Albumin: 2.9 g/dL — ABNORMAL LOW (ref 3.5–5.0)
Anion gap: 10 (ref 5–15)
BUN: 78 mg/dL — ABNORMAL HIGH (ref 8–23)
CO2: 15 mmol/L — ABNORMAL LOW (ref 22–32)
Calcium: 8.5 mg/dL — ABNORMAL LOW (ref 8.9–10.3)
Chloride: 111 mmol/L (ref 98–111)
Creatinine, Ser: 3.71 mg/dL — ABNORMAL HIGH (ref 0.61–1.24)
GFR calc Af Amer: 15 mL/min — ABNORMAL LOW (ref >60)
GFR calc non Af Amer: 13 mL/min — ABNORMAL LOW (ref >60)
GLUCOSE: 95 mg/dL (ref 70–99)
Phosphorus: 4.2 mg/dL (ref 2.5–4.6)
Potassium: 5 mmol/L (ref 3.5–5.1)
SODIUM: 136 mmol/L (ref 135–145)

## 2018-11-17 LAB — CBC
HCT: 24.3 % — ABNORMAL LOW (ref 39.0–52.0)
Hemoglobin: 7.5 g/dL — ABNORMAL LOW (ref 13.0–17.0)
MCH: 30.9 pg (ref 26.0–34.0)
MCHC: 30.9 g/dL (ref 30.0–36.0)
MCV: 100 fL (ref 80.0–100.0)
Platelets: 129 10*3/uL — ABNORMAL LOW (ref 150–400)
RBC: 2.43 MIL/uL — ABNORMAL LOW (ref 4.22–5.81)
RDW: 16.1 % — ABNORMAL HIGH (ref 11.5–15.5)
WBC: 3.9 10*3/uL — ABNORMAL LOW (ref 4.0–10.5)
nRBC: 0 % (ref 0.0–0.2)

## 2018-11-17 LAB — HEMOGLOBIN AND HEMATOCRIT, BLOOD
HCT: 25.4 % — ABNORMAL LOW (ref 39.0–52.0)
Hemoglobin: 7.8 g/dL — ABNORMAL LOW (ref 13.0–17.0)

## 2018-11-17 MED ORDER — SODIUM BICARBONATE 8.4 % IV SOLN: INTRAVENOUS | Status: DC

## 2018-11-17 MED ORDER — SENNOSIDES-DOCUSATE SODIUM 8.6-50 MG PO TABS
1.0000 | ORAL_TABLET | Freq: Every day | ORAL | Status: DC
  Administered 2018-11-17 – 2018-11-28 (×6): 1 via ORAL
  Filled 2018-11-17 (×9): qty 1

## 2018-11-17 MED ORDER — POLYETHYLENE GLYCOL 3350 17 G PO PACK: 17.0000 g | PACK | Freq: Every day | ORAL | Status: DC | PRN

## 2018-11-17 MED ORDER — SODIUM BICARBONATE 8.4 % IV SOLN
INTRAVENOUS | Status: DC
  Administered 2018-11-17 – 2018-11-19 (×4): via INTRAVENOUS
  Filled 2018-11-17 (×4): qty 150

## 2018-11-17 MED ORDER — SODIUM CHLORIDE 0.9 % IV SOLN
1.0000 g | INTRAVENOUS | Status: DC
  Administered 2018-11-17: 1 g via INTRAVENOUS
  Filled 2018-11-17: qty 1

## 2018-11-17 MED ORDER — BISACODYL 10 MG RE SUPP
10.0000 mg | Freq: Every day | RECTAL | Status: DC | PRN
  Administered 2018-11-19: 10 mg via RECTAL

## 2018-11-17 MED ORDER — PIPERACILLIN-TAZOBACTAM IN DEX 2-0.25 GM/50ML IV SOLN
2.2500 g | Freq: Three times a day (TID) | INTRAVENOUS | Status: DC
  Administered 2018-11-17: 2.25 g via INTRAVENOUS
  Filled 2018-11-17 (×2): qty 50

## 2018-11-17 MED ORDER — MIRTAZAPINE 15 MG PO TABS
7.5000 mg | ORAL_TABLET | Freq: Every day | ORAL | Status: DC
  Administered 2018-11-17 – 2018-11-20 (×4): 7.5 mg via ORAL
  Filled 2018-11-17 (×5): qty 1

## 2018-11-17 MED ORDER — PIPERACILLIN-TAZOBACTAM 3.375 G IVPB: 3.3750 g | Freq: Three times a day (TID) | INTRAVENOUS | Status: DC

## 2018-11-17 NOTE — Progress Notes (Signed)
OT Cancellation Note  Patient Details Name: John Peck MRN: [id number] DOB: November 22, 1923   Cancelled Treatment:    Reason Eval/Treat Not Completed: Other (comment)  Noted pt is resident of SNF- will defer OT eval to SNF.  [name], OT Acute Rehabilitation Services [phone] Office- [phone], Edwena Felty D 11/17/2018, 12:52 PM

## 2018-11-17 NOTE — Evaluation (Signed)
Physical Therapy Evaluation Patient Details Name: John Peck MRN: [id number] DOB: 08/13/1924 Today's Date: 11/17/2018   History of Present Illness  85 y.o. male admitted to ED on 11/15/18 from [facility] for ARF, groin pain.  PMH includes CAD with multiple cardiac caths as well as CABG, HTN, CKD stage III, hypothyroidism, dementia, pleural effusion,  anemia, chronic heart failure, stroke, COPD, dyspnea, PAD/PVD, and HLD.  Clinical Impression   Pt presents with LE weakness, difficulty performing bed mobility and transfers, poor sitting and standing balance, posterior leaning in sitting and standing, and decreased activity tolerance. Pt to benefit from acute PT to address deficits. Pt able to transfer from bed to chair with use of RW with mod assist for steadying. Pt states baseline mobility includes ambulation, but pt with very posterior lean in standing that negatively impacted his standing balance and ability to ambulate today. PT recommending d/c to SNF to address deficits after acute care stay. PT to progress mobility as tolerated, and will continue to follow acutely.      Follow Up Recommendations SNF    Equipment Recommendations  None recommended by PT    Recommendations for Other Services       Precautions / Restrictions Precautions Precautions: Fall Restrictions Weight Bearing Restrictions: No      Mobility  Bed Mobility Overal bed mobility: Needs Assistance Bed Mobility: Supine to Sit     Supine to sit: Mod assist;HOB elevated     General bed mobility comments: Mod assist for elevation of trunk, scooting to EOB with use of bed pad. Pt with posterior leaning in sitting, PT with verbal and tactile cuing for posture.   Transfers Overall transfer level: Needs assistance Equipment used: Rolling walker (2 wheeled) Transfers: Sit to/from [facility] Sit to Stand: Mod assist;From elevated surface Stand pivot transfers: Mod assist       General  transfer comment: Mod assist for power up, steadying. Pt with posterior leaning in standing, requiring PT support until corrected with facilitation for hip extension and verbal cuing. Sit to stand x5 as therapeutic intervention for balance and LE strengthening. Mod assist for transfer to recliner at bedside for steadying, slow eccentric lowering into chair, and directing pt and RW. Pt reporting dyspnea during bed mobility and after transfer.  Ambulation/Gait                Stairs            Wheelchair Mobility    Modified Rankin (Stroke Patients Only)       Balance Overall balance assessment: Needs assistance Sitting-balance support: No upper extremity supported Sitting balance-Leahy Scale: Fair Sitting balance - Comments: Pt with posterior leaning until corrected by PT, pt unable to accept challenge to sitting balance.  Postural control: Posterior lean Standing balance support: Bilateral upper extremity supported Standing balance-Leahy Scale: Poor Standing balance comment: Posterior lean in standing, relies on PT and RW for stability.                              Pertinent Vitals/Pain Pain Assessment: 0-10 Pain Score: 3  Pain Location: feet (ingrown toenail)  Pain Descriptors / Indicators: Aching;Sore Pain Intervention(s): Limited activity within patient's tolerance;Repositioned;Monitored during session    [place] expects to be discharged to:: Skilled nursing facility                 Additional Comments: Pt states his wife lives in their home.  Prior Function Level of Independence: Needs assistance   Gait / Transfers Assistance Needed: Pt states that he ambulates in facility with RW. Per PT note from admission 2 months ago, pt was primarily using WC for mobility in facility.   ADL's / Homemaking Assistance Needed: Pt reports he receives assist with bed mobility, toileting, dressing, cleaning, and ambulating. Pt reports  "they help me with pretty much everything"        Hand Dominance   Dominant Hand: Right    Extremity/Trunk Assessment   Upper Extremity Assessment Upper Extremity Assessment: Generalized weakness    Lower Extremity Assessment Lower Extremity Assessment: Generalized weakness       Communication   Communication: HOH  Cognition Arousal/Alertness: Awake/alert Behavior During Therapy: WFL for tasks assessed/performed Overall Cognitive Status: Within Functional Limits for tasks assessed                                 General Comments: Pt oriented x4, answers subjective questions easily, follows mobility commands well.       General Comments      Exercises     Assessment/Plan    PT Assessment Patient needs continued PT services  PT Problem List Decreased strength;Decreased activity tolerance;Decreased knowledge of use of DME;Decreased balance;Decreased safety awareness;Decreased mobility       PT Treatment Interventions DME instruction;Therapeutic activities;Gait training;Therapeutic exercise;Patient/family education;Balance training;Functional mobility training    PT Goals (Current goals can be found in the Care Plan section)  Acute Rehab PT Goals Patient Stated Goal: get more steady, walk  PT Goal Formulation: With patient Time For Goal Achievement: 12/01/18 Potential to Achieve Goals: Good    Frequency Min 2X/week   Barriers to discharge        Co-evaluation               AM-PAC PT "6 Clicks" Mobility  Outcome Measure Help needed turning from your back to your side while in a flat bed without using bedrails?: A Lot Help needed moving from lying on your back to sitting on the side of a flat bed without using bedrails?: A Lot Help needed moving to and from a bed to a chair (including a wheelchair)?: A Lot Help needed standing up from a chair using your arms (e.g., wheelchair or bedside chair)?: A Lot Help needed to walk in hospital  room?: Total Help needed climbing 3-5 steps with a railing? : Total 6 Click Score: 10    End of Session Equipment Utilized During Treatment: Gait belt Activity Tolerance: Patient tolerated treatment well Patient left: in chair;with chair alarm set;with call bell/phone within reach;with SCD's reapplied Nurse Communication: Mobility status PT Visit Diagnosis: Other abnormalities of gait and mobility (R26.89);Unsteadiness on feet (R26.81)    Time: 7741-2878 PT Time Calculation (min) (ACUTE ONLY): 24 min   Charges:   PT Evaluation $PT Eval Low Complexity: 1 Low PT Treatments $Neuromuscular Re-education: 8-22 mins       Jovanne Riggenbach Conception Chancy, PT Acute Rehabilitation Services Pager [phone]  Office [phone]   Breean Nannini D Elonda Husky 11/17/2018, 2:43 PM

## 2018-11-17 NOTE — Progress Notes (Signed)
Nutrition Brief Note  Patient identified on the Malnutrition Screening Tool (MST) Report  Patient is consuming 100% of meals. Ensure supplements have been ordered via ONS protocol. SLP evaluated and found no s/s of aspiration.  Pt with insignificant weight loss for time frame.  Wt Readings from Last 15 Encounters:  11/16/18 67.6 kg  09/01/18 58.8 kg  08/09/18 63 kg  08/02/18 62.2 kg  06/29/18 65.3 kg  06/25/18 71.2 kg  06/02/18 71.2 kg  05/27/18 71.2 kg  05/03/18 75.2 kg  05/02/18 75 kg  04/07/18 75.8 kg  04/05/18 74.4 kg  03/19/18 84 kg  03/07/18 77.3 kg  02/22/18 74.8 kg    Body mass index is 22 kg/m. Patient meets criteria for normal based on current BMI.   Current diet order is regular, patient is consuming approximately 100% of meals at this time. Labs and medications reviewed.   No further nutrition interventions warranted at this time. If nutrition issues arise, please consult RD.   John Bibles, MS, RD, [place] Dietitian Pager: [phone] After Hours Pager: [phone]

## 2018-11-17 NOTE — Progress Notes (Signed)
PROGRESS NOTE    John Peck  DGU:[id number] DOB: [birth year] DOA: 11/15/2018 PCP: Seward Carol, MD    Brief Narrative:  Per Dr. Wynetta Emery- John Peck is a 85 y.o. male with medical history significant of chronic kidney disease, CHF, chronic anemia presented with right-sided inguinal hernia complaints.  It is been hurting him over the past 2 weeks.  Has been treated supportively at the nursing facility with Tylenol and ibuprofen.  Was no inguinal hernia on my or ER clinicians physical exam.  It was also not seen on abdominal pelvic CT.  Patient denied any chest pain shortness of breath, or  fever.  He does follow a fluid restricted diet at the facility.  He also eats a mechanical soft diet, and  was not eating very well so family has been giving him additional food to entice him to eat as well.  Patient was unsure if he has had a urinary changes.  Had a bowel movements morning but it was small. ED Course: Patient CT scan of abdomen and pelvis that did not show any inguinal hernia.  Also did not show any stones or hydronephrosis.  Patient does have a chronic right-sided pleural effusion that was present again today.  He was found to have acute on chronic renal failure with a creatinine of 4.4 with prior being 1.8.  He also had some white cells in his urine and has had previous UTI in the past.   Assessment & Plan:   Principal Problem:   Renal failure (ARF), acute on chronic (HCC) Active Problems:   PAF (paroxysmal atrial fibrillation) (HCC)   CAD in native artery   Chronic systolic CHF (congestive heart failure) (HCC)   Hypothyroidism   Pleural effusion   Constipation   Pyuria   Dementia (HCC)   Acidosis  1 acute on chronic renal failure stage III/acidosis Questionable etiology.  Likely secondary to prerenal azotemia in the setting of diuretics, NSAID use.  Level 99.  Urine creatinine of 39.10.  UA trace leukocytes nitrite -11-20 WBCs.  Urine cultures positive for  pseudomonas aeruginosa.  CT abdomen and pelvis negative for obstruction or hydronephrosis.  Continue IV fluids.  Creatinine at 3.71 from 4.03 from 4.46 on admission.  Last creatinine was 1.82 on 09/01/2018.  Patient with a urine output of 1150 cc over the past 24 hours.  Discontinue IV Rocephin and start IV cefepime pending sensitivities.  Strict I's and O's.  Daily weights.  Change IV fluids to bicarb drip secondary to acidosis and follow with renal function.  Continue to hold diuretics.  No NSAIDs.  Ranexa has been discontinued. Follow.  2.  Paroxysmal atrial fibrillation Continue Coreg for rate control.  On aspirin and Plavix.  Follow.  3.  Chronic systolic heart failure/CAD Currently stable.  Patient is euvolemic.  Continue Coreg, aspirin, Plavix, Imdur, Lipitor.  Diuretics on hold secondary to problem #1.  Monitor closely for volume overload with hydration.  4.  Hypothyroidism Continue home dose Synthroid.  5.  Constipation Likely etiology of patient's abdominal pain and concern for inguinal hernia.  CT abdomen and pelvis negative for hernia.  Patient with large bowel movements per RN.  Patient refusing Dulcolax suppositories and oral MiraLAX.  Discontinue Dulcolax suppositories.  Change MiraLAX to as needed.  Place on Senokot-S nightly.   6.  Chronic pleural effusion Stable.  Monitor closely with volume overload.  7.  Dementia Stable.  8.  Pseudomonas aeruginosa UTI Urine cultures consistent with pseudomonas aeruginosa.  Sensitivities  pending.  Discontinue IV Rocephin and placed on IV cefepime pending sensitivities.  Follow.  9 chronic anemia Hemoglobin currently at 7.5 from 8.0 from 8.5.  Likely dilutional.  Anemia panel consistent with anemia of chronic disease.  Patient denies any overt bleeding. Transfusion threshold hemoglobin less than 7.   DVT prophylaxis: scd Code Status: DNR Family Communication: updated patient.  No family at bedside.  Disposition Plan: Back to SNF once  renal function back to baseline.   Consultants:   None  Procedures:   CT abd/pelvis 11/15/2018.  Antimicrobials:  IV Rocephin 11/15/2018>>>> 11/17/2018  IV cefepime 11/17/2018   Subjective: Patient in bed states he is feeling better than on admission.  Denies any abdominal pain.  No nausea or emesis.  Patient with good urine output per RN.  No chest pain.  No shortness of breath.  Patient does endorse dysuria.  Patient asking for something to help with sleep.  Objective: Vitals:   11/16/18 1940 11/16/18 2014 11/17/18 0551 11/17/18 0748  BP: (!) 146/77  (!) 166/81   Pulse: 60  60   Resp: 16  16   Temp: (!) 97.3 F (36.3 C)  97.9 F (36.6 C)   TempSrc: Oral  Oral   SpO2: 100% 97% 100% 97%  Weight:      Height:        Intake/Output Summary (Last 24 hours) at 11/17/2018 1046 Last data filed at 11/17/2018 3086 Gross per 24 hour  Intake 1468.23 ml  Output 850 ml  Net 618.23 ml   Filed Weights   11/15/18 2110 11/16/18 0500  Weight: 67.1 kg 67.6 kg    Examination:  General exam: Appears calm and comfortable  Respiratory system: Lungs clear to auscultation bilaterally.  No wheezes, no crackles, no rhonchi.  Respiratory effort normal. Cardiovascular system: Regular rate and rhythm with 3/6 systolic ejection murmur.  No JVD.  No lower extremity edema.  Gastrointestinal system: Abdomen is soft, nontender, nondistended, positive bowel sounds.  Central nervous system: Alert and oriented. No focal neurological deficits. Extremities: Symmetric 5 x 5 power. Skin: No rashes, lesions or ulcers Psychiatry: Judgement and insight appear normal. Mood & affect appropriate.     Data Reviewed: I have personally reviewed following labs and imaging studies  CBC: Recent Labs  Lab 11/15/18 1603 11/16/18 0559 11/17/18 0619  WBC 4.3 3.6* 3.9*  NEUTROABS 3.0  --   --   HGB 8.5* 8.0* 7.5*  HCT 27.9* 25.9* 24.3*  MCV 99.6 98.5 100.0  PLT 149* 133* 578*   Basic Metabolic Panel: Recent  Labs  Lab 11/15/18 1603 11/16/18 0559 11/17/18 0619  NA 138 138 136  K 5.4* 4.9 5.0  CL 108 113* 111  CO2 20* 16* 15*  GLUCOSE 101* 83 95  BUN 95* 85* 78*  CREATININE 4.46* 4.03* 3.71*  CALCIUM 8.8* 8.7* 8.5*  PHOS  --   --  4.2   GFR: Estimated Creatinine Clearance: 12.1 mL/min (A) (by C-G formula based on SCr of 3.71 mg/dL (H)). Liver Function Tests: Recent Labs  Lab 11/15/18 1603 11/17/18 0619  AST 11*  --   ALT 7  --   ALKPHOS 55  --   BILITOT 0.8  --   PROT 7.9  --   ALBUMIN 3.0* 2.9*   No results for input(s): LIPASE, AMYLASE in the last 168 hours. No results for input(s): AMMONIA in the last 168 hours. Coagulation Profile: No results for input(s): INR, PROTIME in the last 168 hours. Cardiac Enzymes: No  results for input(s): CKTOTAL, CKMB, CKMBINDEX, TROPONINI in the last 168 hours. BNP (last 3 results) No results for input(s): PROBNP in the last 8760 hours. HbA1C: No results for input(s): HGBA1C in the last 72 hours. CBG: No results for input(s): GLUCAP in the last 168 hours. Lipid Profile: No results for input(s): CHOL, HDL, LDLCALC, TRIG, CHOLHDL, LDLDIRECT in the last 72 hours. Thyroid Function Tests: No results for input(s): TSH, T4TOTAL, FREET4, T3FREE, THYROIDAB in the last 72 hours. Anemia Panel: Recent Labs    11/16/18 0559 11/16/18 0952  VITAMINB12  --  368  FOLATE  --  6.4  FERRITIN  --  419*  TIBC  --  192*  IRON  --  43*  RETICCTPCT 1.9  --    Sepsis Labs: No results for input(s): PROCALCITON, LATICACIDVEN in the last 168 hours.  Recent Results (from the past 240 hour(s))  Urine culture     Status: Abnormal (Preliminary result)   Collection Time: 11/15/18  6:26 PM  Result Value Ref Range Status   Specimen Description   Final    URINE, RANDOM Performed at [facility] [address], [place], [place] [address]    Special Requests   Final    NONE Performed at [facility], [place] [address], [place], [place] [address]    Culture >=100,000 COLONIES/mL PSEUDOMONAS AERUGINOSA (A)  Final   Report Status PENDING  Incomplete  MRSA PCR Screening     Status: None   Collection Time: 11/15/18 11:15 PM  Result Value Ref Range Status   MRSA by PCR NEGATIVE NEGATIVE Final    Comment:        The GeneXpert MRSA Assay (FDA approved for NASAL specimens only), is one component of a comprehensive MRSA colonization surveillance program. It is not intended to diagnose MRSA infection nor to guide or monitor treatment for MRSA infections. Performed at [facility], [place] [address], [place], [place] [address]          Radiology Studies: Ct Abdomen Pelvis Wo Contrast  Result Date: 11/15/2018 CLINICAL DATA:  85 year old male with right-sided groin pain for 2 days. EXAM: CT ABDOMEN AND PELVIS WITHOUT CONTRAST TECHNIQUE: Multidetector CT imaging of the abdomen and pelvis was performed following the standard protocol without IV contrast. COMPARISON:  08/01/2018 CT.  11/04/2018 renal sonogram. FINDINGS: Lower chest: Moderately large right-sided pleural effusion with adjacent compressive atelectasis. Pleural fluid may be partially loculated. Minimal scarring/basilar atelectasis left base. Mild cardiomegaly with pacemaker in place. Hepatobiliary: Taking into account limitation by non contrast imaging, no worrisome hepatic lesion. Scattered small calcifications. 6 mm calcified gallstone. No CT evidence of gallbladder inflammation. Pancreas: Taking into account limitation by non contrast imaging, no worrisome pancreatic mass or inflammation. Spleen: Taking into account limitation by non contrast imaging, no splenic mass or enlargement. Adrenals/Urinary Tract: Mild adrenal gland hyperplasia. Slightly atrophic right kidney. No obstructing stone or hydronephrosis. Right renal cyst. Taking into account limitation by non contrast imaging, no worrisome adrenal lesion. Appearance of prior TURP.  Bladder otherwise unremarkable. Stomach/Bowel: Prominent diverticulosis and muscular hypertrophy descending colon and sigmoid colon without extraluminal inflammation. Moderate stool throughout remainder of the colon. Appendix not visualized. Under distended stomach. No gross abnormality noted. No obvious small bowel abnormality. Vascular/Lymphatic: Prominent atherosclerotic changes aorta and aortic branch vessels. Lower abdominal aortic aneurysm with maximal transverse dimension of 3.5 cm without significant change. Scattered normal size lymph nodes. Reproductive: Prior TURP for prostatectomy. Other: No free air or bowel  containing hernia. No right inguinal canal hernia. Mild third spacing of fluid.  No clear sacral decubitus. Musculoskeletal: Fusion sacroiliac joints. Partial fusion lumbar spine and lower thoracic spine. IMPRESSION: 1. No right inguinal canal hernia detected as cause of patient's right groin pain. Stool filled cecum extends towards the superior margin of the inguinal canal but does not enter such. 2. Moderately large right-sided pleural effusion with adjacent compressive atelectasis. Pleural fluid may be partially loculated. Minimal scarring/basilar atelectasis left base. 3. Mild cardiomegaly with pacemaker in place. 4. Diverticulosis with prominent muscular hypertrophy descending colon and sigmoid colon. Moderate stool throughout remainder of colon. 5. 6 mm calcified gallstone without CT evidence of gallbladder inflammation. 6. Aortic Atherosclerosis (ICD10-I70.0). Lower abdominal aortic aneurysm with maximal transverse dimension of 3.5 cm. Recommend followup by ultrasound in 2 years. This recommendation follows ACR consensus guidelines: White Paper of the ACR Incidental Findings Committee II on Vascular Findings. J Am Coll Radiol 2013; 10:789-794. 7. Fusion sacroiliac joints. Partial fusion lumbar spine lower thoracic spine. Electronically Signed   By: Genia Del M.D.   On: 11/15/2018 18:04         Scheduled Meds: . acetaminophen  650 mg Oral TID  . aspirin  81 mg Oral Daily  . atorvastatin  40 mg Oral QHS  . carvedilol  12.5 mg Oral BID WC  . clopidogrel  75 mg Oral Q breakfast  . ezetimibe  10 mg Oral Daily  . feeding supplement (ENSURE ENLIVE)  237 mL Oral BID BM  . ferrous sulfate  325 mg Oral BID WC  . hydrocortisone cream   Topical BID  . ipratropium-albuterol  3 mL Nebulization BID  . isosorbide mononitrate  90 mg Oral Daily  . latanoprost  1 drop Both Eyes Once per day on Mon Wed  . levothyroxine  125 mcg Oral QAC breakfast  . Melatonin  3 mg Oral QHS  . pantoprazole  40 mg Oral Daily  . polyethylene glycol  17 g Oral Daily  . tamsulosin  0.4 mg Oral Daily   Continuous Infusions: . piperacillin-tazobactam (ZOSYN)  IV 2.25 g (11/17/18 0919)  .  sodium bicarbonate  infusion 1000 mL 75 mL/hr at 11/17/18 0921     LOS: 2 days    Time spent: 35 minutes    Irine Seal, MD [facility] Pager [phone]  If 7PM-7AM, please contact night-coverage [email or web address] Password [facility] 11/17/2018, 10:46 AM

## 2018-11-17 NOTE — Progress Notes (Signed)
PHARMACY NOTE:  ANTIMICROBIAL RENAL DOSAGE ADJUSTMENT  Current antimicrobial regimen includes a mismatch between antimicrobial dosage and estimated renal function.  As per policy approved by the Pharmacy & Therapeutics and Medical Executive Committees, the antimicrobial dosage will be adjusted accordingly.  Current antimicrobial dosage:  Zosyn 3.375gm q8 - 4 hr infusion  Indication: Pseudomonal UTI  Renal Function:   Estimated Creatinine Clearance: 12.1 mL/min (A) (by C-G formula based on SCr of 3.71 mg/dL (H)). []      On intermittent HD, scheduled: []      On CRRT    Antimicrobial dosage has been changed to:  Zosyn 2.25gm q8h    Additional Comments: will adjust as renal function changes   Thank you for allowing pharmacy to be a part of this patient's care.  Minda Ditto  11/17/2018 8:18 AM

## 2018-11-17 NOTE — Care Management Note (Signed)
Case Management Note  Patient Details  Name: John Peck MRN: [id number] Date of Birth: [birth year]  Subjective/Objective:                  Return to top of Urinary Tract Infection (UTI) RRG - [place]  Discharge readiness is indicated by patient meeting Recovery Milestones, including ALL of the following: ? Hemodynamic stability yes ? Fever absent or reduced  Temp 97.9 ? Vomiting absent or improved absent ? Urine output adequate no  ? Renal function at baseline or acceptable   for next level of care   No bun =78, creat=3.71 ? Pain absent or managed managed ? Ambulatory  no ? Oral hydration, medications,[L] and diet ? Iv zosayn, iv na2hco3 drip ? Urine culture positive for  Pseudomonas aeruginosa Level of care=inpatient, positive cultures, iv abx, iv flds.  Action/Plan: Will follow for progression of care and clinical status. Will follow for case management needs none present at this time.  Expected Discharge Date:  11/18/18               Expected Discharge Plan:     In-House Referral:     Discharge planning Services     Post Acute Care Choice:    Choice offered to:     DME Arranged:    DME Agency:     HH Arranged:    HH Agency:     Status of Service:     If discussed at [employer], dates discussed:    Additional Comments:  Leeroy Cha, RN 11/17/2018, 8:41 AM

## 2018-11-18 LAB — RENAL FUNCTION PANEL
Albumin: 2.6 g/dL — ABNORMAL LOW (ref 3.5–5.0)
Anion gap: 10 (ref 5–15)
BUN: 67 mg/dL — ABNORMAL HIGH (ref 8–23)
CO2: 20 mmol/L — AB (ref 22–32)
Calcium: 8.4 mg/dL — ABNORMAL LOW (ref 8.9–10.3)
Chloride: 109 mmol/L (ref 98–111)
Creatinine, Ser: 3.18 mg/dL — ABNORMAL HIGH (ref 0.61–1.24)
GFR calc Af Amer: 19 mL/min — ABNORMAL LOW (ref >60)
GFR calc non Af Amer: 16 mL/min — ABNORMAL LOW (ref >60)
Glucose, Bld: 87 mg/dL (ref 70–99)
Phosphorus: 3.4 mg/dL (ref 2.5–4.6)
Potassium: 5.1 mmol/L (ref 3.5–5.1)
SODIUM: 139 mmol/L (ref 135–145)

## 2018-11-18 LAB — URINE CULTURE: Culture: >=100000 — AB

## 2018-11-18 LAB — CBC
HCT: 25.1 % — ABNORMAL LOW (ref 39.0–52.0)
Hemoglobin: 7.8 g/dL — ABNORMAL LOW (ref 13.0–17.0)
MCH: 30.4 pg (ref 26.0–34.0)
MCHC: 31.1 g/dL (ref 30.0–36.0)
MCV: 97.7 fL (ref 80.0–100.0)
Platelets: 149 10*3/uL — ABNORMAL LOW (ref 150–400)
RBC: 2.57 MIL/uL — ABNORMAL LOW (ref 4.22–5.81)
RDW: 16.6 % — ABNORMAL HIGH (ref 11.5–15.5)
WBC: 4 10*3/uL (ref 4.0–10.5)
nRBC: 0 % (ref 0.0–0.2)

## 2018-11-18 MED ORDER — POLYETHYLENE GLYCOL 3350 17 G PO PACK
17.0000 g | PACK | Freq: Every day | ORAL | Status: DC
  Administered 2018-11-19 – 2018-11-29 (×3): 17 g via ORAL
  Filled 2018-11-18 (×4): qty 1

## 2018-11-18 MED ORDER — GABAPENTIN 100 MG PO CAPS
100.0000 mg | ORAL_CAPSULE | Freq: Every day | ORAL | Status: DC
  Administered 2018-11-18 – 2018-11-21 (×4): 100 mg via ORAL
  Filled 2018-11-18 (×4): qty 1

## 2018-11-18 MED ORDER — SODIUM CHLORIDE 0.9 % IV SOLN
2.0000 g | INTRAVENOUS | Status: DC
  Administered 2018-11-18 – 2018-11-23 (×6): 2 g via INTRAVENOUS
  Filled 2018-11-18 (×6): qty 2

## 2018-11-18 NOTE — Progress Notes (Signed)
No pain or nausea overnight. Vitals stable.

## 2018-11-18 NOTE — Care Management Important Message (Signed)
Important Message  Patient Details  Name: John Peck MRN: [id number] Date of Birth: 1924-04-28   Medicare Important Message Given:  Yes    Kerin Salen 11/18/2018, 12:19 [place] Message  Patient Details  Name: John Peck MRN: [id number] Date of Birth: Oct 23, 1924   Medicare Important Message Given:  Yes    Kerin Salen 11/18/2018, 12:19 PM

## 2018-11-18 NOTE — Progress Notes (Signed)
PROGRESS NOTE    John Peck  OEU:[id number] DOB: August 01, 1924 DOA: 11/15/2018 PCP: John Carol, MD    Brief Narrative:  Per Dr. Wynetta Emery- John Peck is a 85 y.o. male with medical history significant of chronic kidney disease, CHF, chronic anemia presented with right-sided inguinal hernia complaints.  It is been hurting him over the past 2 weeks.  Has been treated supportively at the nursing facility with Tylenol and ibuprofen.  Was no inguinal hernia on my or ER clinicians physical exam.  It was also not seen on abdominal pelvic CT.  Patient denied any chest pain shortness of breath, or  fever.  He does follow a fluid restricted diet at the facility.  He also eats a mechanical soft diet, and  was not eating very well so family has been giving him additional food to entice him to eat as well.  Patient was unsure if he has had a urinary changes.  Had a bowel movements morning but it was small. ED Course: Patient CT scan of abdomen and pelvis that did not show any inguinal hernia.  Also did not show any stones or hydronephrosis.  Patient does have a chronic right-sided pleural effusion that was present again today.  He was found to have acute on chronic renal failure with a creatinine of 4.4 with prior being 1.8.  He also had some white cells in his urine and has had previous UTI in the past.   Assessment & Plan:   Principal Problem:   Renal failure (ARF), acute on chronic (HCC) Active Problems:   PAF (paroxysmal atrial fibrillation) (HCC)   CAD in native artery   Chronic systolic CHF (congestive heart failure) (HCC)   Hypothyroidism   Pleural effusion   Constipation   Pyuria   Dementia (HCC)   Acidosis  1 acute on chronic renal failure stage III/acidosis Questionable etiology.  Likely secondary to prerenal azotemia in the setting of diuretics, NSAID use.  Level 99.  Urine creatinine of 39.10.  UA trace leukocytes nitrite -11-20 WBCs.  Urine cultures positive for  pseudomonas aeruginosa.  CT abdomen and pelvis negative for obstruction or hydronephrosis.  Continue IV fluids.  Creatinine at 3.18 from 3.71 from 4.03 from 4.46 on admission.  Last creatinine was 1.82 on 09/01/2018.  Patient with a urine output of 1150 cc over the past 24 hours.  Discontinued IV Rocephin and started IV cefepime as patient with a Pseudomonas UTI.  Continue bicarb drip secondary to acidosis likely decrease rate to 50 cc/h tomorrow.  Continue to hold diuretics.  No NSAIDs.  Ranexa has been discontinued.  Follow.   2.  Paroxysmal atrial fibrillation Coreg for rate control.  Continue aspirin and Plavix.  Follow.   3.  Chronic systolic heart failure/CAD Currently stable.  Patient is euvolemic.  Continue Coreg, aspirin, Plavix, Imdur, Lipitor.  Diuretics on hold secondary to problem #1.  Monitor closely for volume overload with hydration.  4.  Hypothyroidism Continue home dose Synthroid.  5.  Constipation Likely etiology of patient's abdominal pain and concern for inguinal hernia.  CT abdomen and pelvis negative for hernia.  Patient with large bowel movements per RN.  Patient refusing Dulcolax suppositories and oral MiraLAX.  Discontinued Dulcolax suppositories.  Change MiraLAX to daily.  Continue Senokot-S.   6.  Chronic pleural effusion Stable.  Monitor closely with volume overload.  7.  Dementia Stable.  8.  Pseudomonas aeruginosa UTI Urine cultures consistent with pseudomonas aeruginosa.  Sensitivities to ceftazidime, Cipro, gent,  imipenem, Zosyn, cefepime.  Continue IV cefepime.  9 chronic anemia Hemoglobin currently at 7.8 from 7.5 from 8.0 from 8.5.  Likely dilutional.  Anemia panel consistent with anemia of chronic disease.  Patient denies any overt bleeding. Transfusion threshold hemoglobin less than 7.   DVT prophylaxis: scd Code Status: DNR Family Communication: updated patient.  No family at bedside.  Disposition Plan: Back to SNF once renal function back to  baseline.   Consultants:   None  Procedures:   CT abd/pelvis 11/15/2018.  Antimicrobials:  IV Rocephin 11/15/2018>>>> 11/17/2018  IV cefepime 11/17/2018   Subjective: Patient laying in bed.  Denies any chest pain.  No abdominal pain.  No shortness of breath.  Tolerating current oral intake.  Patient with good urine output.  Dysuria improving.  Was able to sleep last night after being started on Remeron.    Objective: Vitals:   11/17/18 2009 11/17/18 2038 11/18/18 0552 11/18/18 0935  BP:  (!) 148/65 (!) 155/78   Pulse:  (!) 58 60   Resp:  16 16   Temp:  97.6 F (36.4 C) 98.8 F (37.1 C)   TempSrc:  Oral Oral   SpO2: 99% 100% 99% 96%  Weight:   59.6 kg   Height:        Intake/Output Summary (Last 24 hours) at 11/18/2018 1019 Last data filed at 11/18/2018 0846 Gross per 24 hour  Intake 1760.93 ml  Output 1100 ml  Net 660.93 ml   Filed Weights   11/15/18 2110 11/16/18 0500 11/18/18 0552  Weight: 67.1 kg 67.6 kg 59.6 kg    Examination:  General exam: NAD Respiratory system: CTAB.  No wheezes, no crackles, no rhonchi.  Normal respiratory effort.  Cardiovascular system: RRR  With 3/6 SEM . No JVD, no lower extremity edema.  No murmurs rubs or gallops.  Gastrointestinal system: Abdomen is nontender, nondistended, soft, positive bowel sounds.  Central nervous system: Alert and oriented. No focal neurological deficits. Extremities: Symmetric 5 x 5 power. Skin: No rashes, lesions or ulcers Psychiatry: Judgement and insight appear normal. Mood & affect appropriate.     Data Reviewed: I have personally reviewed following labs and imaging studies  CBC: Recent Labs  Lab 11/15/18 1603 11/16/18 0559 11/17/18 0619 11/17/18 1447 11/18/18 0655  WBC 4.3 3.6* 3.9*  --  4.0  NEUTROABS 3.0  --   --   --   --   HGB 8.5* 8.0* 7.5* 7.8* 7.8*  HCT 27.9* 25.9* 24.3* 25.4* 25.1*  MCV 99.6 98.5 100.0  --  97.7  PLT 149* 133* 129*  --  884*   Basic Metabolic Panel: Recent Labs    Lab 11/15/18 1603 11/16/18 0559 11/17/18 0619 11/18/18 0655  NA 138 138 136 139  K 5.4* 4.9 5.0 5.1  CL 108 113* 111 109  CO2 20* 16* 15* 20*  GLUCOSE 101* 83 95 87  BUN 95* 85* 78* 67*  CREATININE 4.46* 4.03* 3.71* 3.18*  CALCIUM 8.8* 8.7* 8.5* 8.4*  PHOS  --   --  4.2 3.4   GFR: Estimated Creatinine Clearance: 12.5 mL/min (A) (by C-G formula based on SCr of 3.18 mg/dL (H)). Liver Function Tests: Recent Labs  Lab 11/15/18 1603 11/17/18 0619 11/18/18 0655  AST 11*  --   --   ALT 7  --   --   ALKPHOS 55  --   --   BILITOT 0.8  --   --   PROT 7.9  --   --  ALBUMIN 3.0* 2.9* 2.6*   No results for input(s): LIPASE, AMYLASE in the last 168 hours. No results for input(s): AMMONIA in the last 168 hours. Coagulation Profile: No results for input(s): INR, PROTIME in the last 168 hours. Cardiac Enzymes: No results for input(s): CKTOTAL, CKMB, CKMBINDEX, TROPONINI in the last 168 hours. BNP (last 3 results) No results for input(s): PROBNP in the last 8760 hours. HbA1C: No results for input(s): HGBA1C in the last 72 hours. CBG: No results for input(s): GLUCAP in the last 168 hours. Lipid Profile: No results for input(s): CHOL, HDL, LDLCALC, TRIG, CHOLHDL, LDLDIRECT in the last 72 hours. Thyroid Function Tests: No results for input(s): TSH, T4TOTAL, FREET4, T3FREE, THYROIDAB in the last 72 hours. Anemia Panel: Recent Labs    11/16/18 0559 11/16/18 0952  VITAMINB12  --  368  FOLATE  --  6.4  FERRITIN  --  419*  TIBC  --  192*  IRON  --  43*  RETICCTPCT 1.9  --    Sepsis Labs: No results for input(s): PROCALCITON, LATICACIDVEN in the last 168 hours.  Recent Results (from the past 240 hour(s))  Urine culture     Status: Abnormal   Collection Time: 11/15/18  6:26 PM  Result Value Ref Range Status   Specimen Description   Final    URINE, RANDOM Performed at [facility] [address] [place], [place] [address]    Special Requests   Final     NONE Performed at [facility], [place] [address], [place], [place] [address]    Culture >=100,000 COLONIES/mL PSEUDOMONAS AERUGINOSA (A)  Final   Report Status 11/18/2018 FINAL  Final   Organism ID, Bacteria PSEUDOMONAS AERUGINOSA (A)  Final      Susceptibility   Pseudomonas aeruginosa - MIC*    CEFTAZIDIME 4 SENSITIVE Sensitive     CIPROFLOXACIN <=0.25 SENSITIVE Sensitive     GENTAMICIN <=1 SENSITIVE Sensitive     IMIPENEM 2 SENSITIVE Sensitive     PIP/TAZO <=4 SENSITIVE Sensitive     CEFEPIME 4 SENSITIVE Sensitive     * >=100,000 COLONIES/mL PSEUDOMONAS AERUGINOSA  MRSA PCR Screening     Status: None   Collection Time: 11/15/18 11:15 PM  Result Value Ref Range Status   MRSA by PCR NEGATIVE NEGATIVE Final    Comment:        The GeneXpert MRSA Assay (FDA approved for NASAL specimens only), is one component of a comprehensive MRSA colonization surveillance program. It is not intended to diagnose MRSA infection nor to guide or monitor treatment for MRSA infections. Performed at [facility], [place] [address], [place], [place] [address]          Radiology Studies: No results found.      Scheduled Meds: . acetaminophen  650 mg Oral TID  . aspirin  81 mg Oral Daily  . atorvastatin  40 mg Oral QHS  . carvedilol  12.5 mg Oral BID WC  . clopidogrel  75 mg Oral Q breakfast  . ezetimibe  10 mg Oral Daily  . feeding supplement (ENSURE ENLIVE)  237 mL Oral BID BM  . ferrous sulfate  325 mg Oral BID WC  . hydrocortisone cream   Topical BID  . ipratropium-albuterol  3 mL Nebulization BID  . isosorbide mononitrate  90 mg Oral Daily  . latanoprost  1 drop Both Eyes Once per day on Mon Wed  . levothyroxine  125 mcg Oral QAC breakfast  .  mirtazapine  7.5 mg Oral QHS  . pantoprazole  40 mg Oral Daily  . polyethylene glycol  17 g Oral Daily  . senna-docusate  1 tablet Oral QHS  . tamsulosin  0.4 mg Oral Daily   Continuous Infusions: .  ceFEPime (MAXIPIME) IV Stopped (11/17/18 1743)  .  sodium bicarbonate  infusion 1000 mL 75 mL/hr at 11/17/18 2200     LOS: 3 days    Time spent: 35 minutes    Irine Seal, MD [facility] Pager [phone]  If 7PM-7AM, please contact night-coverage [email or web address] Password [email or web address] 11/18/2018, 10:19 AM

## 2018-11-18 NOTE — Progress Notes (Signed)
PHARMACY NOTE:  ANTIMICROBIAL RENAL DOSAGE ADJUSTMENT  Current antimicrobial regimen includes a mismatch between antimicrobial dosage and estimated renal function.  As per policy approved by the Pharmacy & Therapeutics and Medical Executive Committees, the antimicrobial dosage will be adjusted accordingly.  Current antimicrobial dosage:  Cefepime 1 g IV q24h  Indication: Pseudomonas UTI  Renal Function:  Estimated Creatinine Clearance: 12.5 mL/min (A) (by C-G formula based on SCr of 3.18 mg/dL (H)). []      On intermittent HD, scheduled: []      On CRRT    Antimicrobial dosage has been changed to:  Cefepime 2 g IV q24h   Thank you for allowing pharmacy to be a part of this patient's care.  Lenis Noon, [facility] 11/18/2018 11:53 AM

## 2018-11-19 LAB — RENAL FUNCTION PANEL
ALBUMIN: 2.4 g/dL — AB (ref 3.5–5.0)
Anion gap: 8 (ref 5–15)
BUN: 63 mg/dL — ABNORMAL HIGH (ref 8–23)
CALCIUM: 8.1 mg/dL — AB (ref 8.9–10.3)
CO2: 30 mmol/L (ref 22–32)
CREATININE: 2.96 mg/dL — AB (ref 0.61–1.24)
Chloride: 103 mmol/L (ref 98–111)
GFR calc Af Amer: 20 mL/min — ABNORMAL LOW (ref >60)
GFR calc non Af Amer: 18 mL/min — ABNORMAL LOW (ref >60)
Glucose, Bld: 97 mg/dL (ref 70–99)
Phosphorus: 3.1 mg/dL (ref 2.5–4.6)
Potassium: 4.6 mmol/L (ref 3.5–5.1)
SODIUM: 141 mmol/L (ref 135–145)

## 2018-11-19 LAB — VITAMIN B12: Vitamin B-12: 300 pg/mL (ref 180–914)

## 2018-11-19 MED ORDER — BISACODYL 10 MG RE SUPP
10.0000 mg | Freq: Every day | RECTAL | Status: DC
  Administered 2018-11-29: 10 mg via RECTAL
  Filled 2018-11-19 (×4): qty 1

## 2018-11-19 MED ORDER — SODIUM CHLORIDE 0.9 % IV SOLN
INTRAVENOUS | Status: DC
  Administered 2018-11-19: 09:00:00 via INTRAVENOUS

## 2018-11-19 MED ORDER — SODIUM CHLORIDE 0.9 % IV SOLN: INTRAVENOUS | Status: DC

## 2018-11-19 MED ORDER — CYANOCOBALAMIN 1000 MCG/ML IJ SOLN
1000.0000 ug | Freq: Every day | INTRAMUSCULAR | Status: DC
  Administered 2018-11-19 – 2018-11-25 (×4): 1000 ug via INTRAMUSCULAR
  Filled 2018-11-19 (×7): qty 1

## 2018-11-19 NOTE — Progress Notes (Signed)
PROGRESS NOTE    John Peck  PTW:[id number] DOB: 09/02/1924 DOA: 11/15/2018 PCP: Seward Carol, MD    Brief Narrative:  Per Dr. Wynetta Peck- John Peck is a 85 y.o. male with medical history significant of chronic kidney disease, CHF, chronic anemia presented with right-sided inguinal hernia complaints.  It is been hurting him over the past 2 weeks.  Has been treated supportively at the nursing facility with Tylenol and ibuprofen.  Was no inguinal hernia on my or ER clinicians physical exam.  It was also not seen on abdominal pelvic CT.  Patient denied any chest pain shortness of breath, or  fever.  He does follow a fluid restricted diet at the facility.  He also eats a mechanical soft diet, and  was not eating very well so family has been giving him additional food to entice him to eat as well.  Patient was unsure if he has had a urinary changes.  Had a bowel movements morning but it was small. ED Course: Patient CT scan of abdomen and pelvis that did not show any inguinal hernia.  Also did not show any stones or hydronephrosis.  Patient does have a chronic right-sided pleural effusion that was present again today.  He was found to have acute on chronic renal failure with a creatinine of 4.4 with prior being 1.8.  He also had some white cells in his urine and has had previous UTI in the past.   Assessment & Plan:   Principal Problem:   Renal failure (ARF), acute on chronic (HCC) Active Problems:   PAF (paroxysmal atrial fibrillation) (HCC)   CAD in native artery   Chronic systolic CHF (congestive heart failure) (HCC)   Hypothyroidism   Pleural effusion   Constipation   Pyuria   Dementia (HCC)   Acidosis  1 acute on chronic renal failure stage III/acidosis Questionable etiology.  Likely secondary to prerenal azotemia in the setting of diuretics, NSAID use.  Level 99.  Urine creatinine of 39.10.  UA trace leukocytes nitrite -11-20 WBCs.  Urine cultures positive for  pseudomonas aeruginosa.  CT abdomen and pelvis negative for obstruction or hydronephrosis.  Continue IV fluids.  Creatinine at 2.96 from 3.18 from 3.71 from 4.03 from 4.46 on admission.  Last creatinine was 1.82 on 09/01/2018.  Patient with a urine output of 1180 cc over the past 24 hours.  Discontinued IV Rocephin and started IV cefepime as patient with a Pseudomonas UTI.  Discontinue bicarb drip as acidosis seem to have resolved.  Continue to hold diuretics.  No NSAIDs.  Ranexa discontinued.  Follow.   2.  Paroxysmal atrial fibrillation Coreg for rate control.  Continue aspirin and Plavix.  Follow.   3.  Chronic systolic heart failure/CAD Currently stable.  Patient is euvolemic.  Continue Coreg, aspirin, Plavix, Imdur, Lipitor.  Diuretics on hold secondary to problem #1.  Saline lock IV fluids.  Continue to hold Lasix.    4.  Hypothyroidism Continue home dose Synthroid.  5.  Constipation Likely etiology of patient's abdominal pain and concern for inguinal hernia.  CT abdomen and pelvis negative for hernia.  Patient with large bowel movements per RN.  Patient refusing Dulcolax suppositories and oral MiraLAX.  Patient with some complaints of abdominal discomfort.  Placed back on Dulcolax suppositories daily.  Continue MiraLAX daily.  Senokot-S.  Will need a bowel regimen on discharge.   6.  Chronic pleural effusion Stable.  Monitor closely with volume overload.  Saline lock IV fluids.  7.  Dementia Stable.  8.  Pseudomonas aeruginosa UTI Urine cultures consistent with pseudomonas aeruginosa.  Sensitivities to ceftazidime, Cipro, gent, imipenem, Zosyn, cefepime.  Continue IV cefepime.  9 chronic anemia Hemoglobin currently at 7.8 from 7.5 from 8.0 from 8.5.  Likely dilutional.  Anemia panel consistent with anemia of chronic disease.  Patient denies any overt bleeding. Transfusion threshold hemoglobin less than 7.  Saline lock IV fluids.  10.  Probable neuropathy of feet Patient started on  Neurontin 100 mg daily.   DVT prophylaxis: scd Code Status: DNR Family Communication: updated patient.  No family at bedside.  Disposition Plan: Back to SNF once renal function back to baseline.   Consultants:   None  Procedures:   CT abd/pelvis 11/15/2018.  Antimicrobials:  IV Rocephin 11/15/2018>>>> 11/17/2018  IV cefepime 11/17/2018   Subjective: Patient laying in bed.  Denies any chest pain.  No abdominal pain.  No shortness of breath.  Tolerating current oral intake.  Patient with good urine output.  Dysuria improving.  Patient with some complaints of abdominal discomfort.    Objective: Vitals:   11/18/18 1957 11/19/18 0500 11/19/18 0516 11/19/18 0650  BP:   (!) 165/89 (!) 153/71  Pulse:   64 60  Resp:   20   Temp:   98.7 F (37.1 C)   TempSrc:   Oral   SpO2: 96%  98%   Weight:  72.1 kg    Height:        Intake/Output Summary (Last 24 hours) at 11/19/2018 1237 Last data filed at 11/19/2018 1146 Gross per 24 hour  Intake 2285.31 ml  Output 1180 ml  Net 1105.31 ml   Filed Weights   11/16/18 0500 11/18/18 0552 11/19/18 0500  Weight: 67.6 kg 59.6 kg 72.1 kg    Examination:  General exam: NAD Respiratory system: Some bibasilar crackles.  No wheezing, no rhonchi.  Normal respiratory effort.  Cardiovascular system: RRR  With 3/6 SEM . No JVD, no lower extremity edema.  No murmurs rubs or gallops.  Gastrointestinal system: Abdomen is soft, nondistended, some diffuse tenderness to palpation in the lower abdomen.  No rebound.  No guarding.  Central nervous system: Alert and oriented. No focal neurological deficits. Extremities: Symmetric 5 x 5 power. Skin: No rashes, lesions or ulcers Psychiatry: Judgement and insight appear normal. Mood & affect appropriate.     Data Reviewed: I have personally reviewed following labs and imaging studies  CBC: Recent Labs  Lab 11/15/18 1603 11/16/18 0559 11/17/18 0619 11/17/18 1447 11/18/18 0655  WBC 4.3 3.6* 3.9*  --   4.0  NEUTROABS 3.0  --   --   --   --   HGB 8.5* 8.0* 7.5* 7.8* 7.8*  HCT 27.9* 25.9* 24.3* 25.4* 25.1*  MCV 99.6 98.5 100.0  --  97.7  PLT 149* 133* 129*  --  831*   Basic Metabolic Panel: Recent Labs  Lab 11/15/18 1603 11/16/18 0559 11/17/18 0619 11/18/18 0655 11/19/18 0702  NA 138 138 136 139 141  K 5.4* 4.9 5.0 5.1 4.6  CL 108 113* 111 109 103  CO2 20* 16* 15* 20* 30  GLUCOSE 101* 83 95 87 97  BUN 95* 85* 78* 67* 63*  CREATININE 4.46* 4.03* 3.71* 3.18* 2.96*  CALCIUM 8.8* 8.7* 8.5* 8.4* 8.1*  PHOS  --   --  4.2 3.4 3.1   GFR: Estimated Creatinine Clearance: 15.9 mL/min (A) (by C-G formula based on SCr of 2.96 mg/dL (H)). Liver Function Tests: Recent  Labs  Lab 11/15/18 1603 11/17/18 0619 11/18/18 0655 11/19/18 0702  AST 11*  --   --   --   ALT 7  --   --   --   ALKPHOS 55  --   --   --   BILITOT 0.8  --   --   --   PROT 7.9  --   --   --   ALBUMIN 3.0* 2.9* 2.6* 2.4*   No results for input(s): LIPASE, AMYLASE in the last 168 hours. No results for input(s): AMMONIA in the last 168 hours. Coagulation Profile: No results for input(s): INR, PROTIME in the last 168 hours. Cardiac Enzymes: No results for input(s): CKTOTAL, CKMB, CKMBINDEX, TROPONINI in the last 168 hours. BNP (last 3 results) No results for input(s): PROBNP in the last 8760 hours. HbA1C: No results for input(s): HGBA1C in the last 72 hours. CBG: No results for input(s): GLUCAP in the last 168 hours. Lipid Profile: No results for input(s): CHOL, HDL, LDLCALC, TRIG, CHOLHDL, LDLDIRECT in the last 72 hours. Thyroid Function Tests: No results for input(s): TSH, T4TOTAL, FREET4, T3FREE, THYROIDAB in the last 72 hours. Anemia Panel: Recent Labs    11/19/18 0702  VITAMINB12 300   Sepsis Labs: No results for input(s): PROCALCITON, LATICACIDVEN in the last 168 hours.  Recent Results (from the past 240 hour(s))  Urine culture     Status: Abnormal   Collection Time: 11/15/18  6:26 PM  Result  Value Ref Range Status   Specimen Description   Final    URINE, RANDOM Performed at [facility] [address] [place], [place] [address]    Special Requests   Final    NONE Performed at [facility], [place] [address] [place], [place] [address]    Culture >=100,000 COLONIES/mL PSEUDOMONAS AERUGINOSA (A)  Final   Report Status 11/18/2018 FINAL  Final   Organism ID, Bacteria PSEUDOMONAS AERUGINOSA (A)  Final      Susceptibility   Pseudomonas aeruginosa - MIC*    CEFTAZIDIME 4 SENSITIVE Sensitive     CIPROFLOXACIN <=0.25 SENSITIVE Sensitive     GENTAMICIN <=1 SENSITIVE Sensitive     IMIPENEM 2 SENSITIVE Sensitive     PIP/TAZO <=4 SENSITIVE Sensitive     CEFEPIME 4 SENSITIVE Sensitive     * >=100,000 COLONIES/mL PSEUDOMONAS AERUGINOSA  MRSA PCR Screening     Status: None   Collection Time: 11/15/18 11:15 PM  Result Value Ref Range Status   MRSA by PCR NEGATIVE NEGATIVE Final    Comment:        The GeneXpert MRSA Assay (FDA approved for NASAL specimens only), is one component of a comprehensive MRSA colonization surveillance program. It is not intended to diagnose MRSA infection nor to guide or monitor treatment for MRSA infections. Performed at [facility], [place] [address] [place], [place] [address]          Radiology Studies: No results found.      Scheduled Meds: . acetaminophen  650 mg Oral TID  . aspirin  81 mg Oral Daily  . atorvastatin  40 mg Oral QHS  . bisacodyl  10 mg Rectal Daily  . carvedilol  12.5 mg Oral BID WC  . clopidogrel  75 mg Oral Q breakfast  . cyanocobalamin  1,000 mcg Intramuscular Daily  . ezetimibe  10 mg Oral Daily  . feeding supplement (ENSURE ENLIVE)  237 mL Oral BID BM  . ferrous sulfate  325  mg Oral BID WC  . gabapentin  100 mg Oral Daily  . hydrocortisone cream   Topical BID  . ipratropium-albuterol  3 mL Nebulization BID  . isosorbide mononitrate  90 mg Oral Daily    . latanoprost  1 drop Both Eyes Once per day on Mon Wed  . levothyroxine  125 mcg Oral QAC breakfast  . mirtazapine  7.5 mg Oral QHS  . pantoprazole  40 mg Oral Daily  . polyethylene glycol  17 g Oral Daily  . senna-docusate  1 tablet Oral QHS  . tamsulosin  0.4 mg Oral Daily   Continuous Infusions: . sodium chloride 75 mL/hr at 11/19/18 0925  . sodium chloride 10 mL/hr at 11/19/18 1228  . ceFEPime (MAXIPIME) IV 2 g (11/18/18 1738)     LOS: 4 days    Time spent: 35 minutes    John Seal, MD [facility] Pager [phone]  If 7PM-7AM, please contact night-coverage [email or web address] Password [facility] 11/19/2018, 12:37 PM

## 2018-11-20 ENCOUNTER — Inpatient Hospital Stay (HOSPITAL_COMMUNITY): Payer: Medicare HMO

## 2018-11-20 DIAGNOSIS — I5023 Acute on chronic systolic (congestive) heart failure: Secondary | ICD-10-CM

## 2018-11-20 DIAGNOSIS — R0602 Shortness of breath: Secondary | ICD-10-CM

## 2018-11-20 LAB — HEMOGLOBIN AND HEMATOCRIT, BLOOD
HCT: 23.7 % — ABNORMAL LOW (ref 39.0–52.0)
Hemoglobin: 7.4 g/dL — ABNORMAL LOW (ref 13.0–17.0)

## 2018-11-20 LAB — RENAL FUNCTION PANEL
Albumin: 2.3 g/dL — ABNORMAL LOW (ref 3.5–5.0)
Anion gap: 9 (ref 5–15)
BUN: 59 mg/dL — ABNORMAL HIGH (ref 8–23)
CO2: 27 mmol/L (ref 22–32)
Calcium: 8.2 mg/dL — ABNORMAL LOW (ref 8.9–10.3)
Chloride: 106 mmol/L (ref 98–111)
Creatinine, Ser: 2.68 mg/dL — ABNORMAL HIGH (ref 0.61–1.24)
GFR calc Af Amer: 23 mL/min — ABNORMAL LOW (ref >60)
GFR calc non Af Amer: 20 mL/min — ABNORMAL LOW (ref >60)
Glucose, Bld: 120 mg/dL — ABNORMAL HIGH (ref 70–99)
Phosphorus: 2.9 mg/dL (ref 2.5–4.6)
Potassium: 4.4 mmol/L (ref 3.5–5.1)
Sodium: 142 mmol/L (ref 135–145)

## 2018-11-20 MED ORDER — FUROSEMIDE 20 MG PO TABS
20.0000 mg | ORAL_TABLET | Freq: Two times a day (BID) | ORAL | Status: DC
  Administered 2018-11-21: 20 mg via ORAL
  Filled 2018-11-20: qty 1

## 2018-11-20 MED ORDER — HYDROXYZINE HCL 25 MG PO TABS
25.0000 mg | ORAL_TABLET | Freq: Three times a day (TID) | ORAL | Status: DC | PRN
  Administered 2018-11-20: 25 mg via ORAL
  Filled 2018-11-20: qty 1

## 2018-11-20 MED ORDER — FUROSEMIDE 10 MG/ML IJ SOLN
20.0000 mg | Freq: Two times a day (BID) | INTRAMUSCULAR | Status: AC
  Administered 2018-11-20 (×2): 20 mg via INTRAVENOUS
  Filled 2018-11-20 (×2): qty 2

## 2018-11-20 MED ORDER — DICLOFENAC SODIUM 1 % TD GEL
2.0000 g | Freq: Four times a day (QID) | TRANSDERMAL | Status: DC | PRN
  Administered 2018-11-20 – 2018-11-28 (×2): 2 g via TOPICAL
  Filled 2018-11-20: qty 100

## 2018-11-20 NOTE — Progress Notes (Signed)
PROGRESS NOTE    John Peck  SEG:[id number] DOB: April 09, 1924 DOA: 11/15/2018 PCP: Seward Carol, MD    Brief Narrative:  Per Dr. Wynetta Emery- John Peck is a 85 y.o. male with medical history significant of chronic kidney disease, CHF, chronic anemia presented with right-sided inguinal hernia complaints.  It is been hurting him over the past 2 weeks.  Has been treated supportively at the nursing facility with Tylenol and ibuprofen.  Was no inguinal hernia on my or ER clinicians physical exam.  It was also not seen on abdominal pelvic CT.  Patient denied any chest pain shortness of breath, or  fever.  He does follow a fluid restricted diet at the facility.  He also eats a mechanical soft diet, and  was not eating very well so family has been giving him additional food to entice him to eat as well.  Patient was unsure if he has had a urinary changes.  Had a bowel movements morning but it was small. ED Course: Patient CT scan of abdomen and pelvis that did not show any inguinal hernia.  Also did not show any stones or hydronephrosis.  Patient does have a chronic right-sided pleural effusion that was present again today.  He was found to have acute on chronic renal failure with a creatinine of 4.4 with prior being 1.8.  He also had some white cells in his urine and has had previous UTI in the past.   Assessment & Plan:   Principal Problem:   Renal failure (ARF), acute on chronic (HCC) Active Problems:   PAF (paroxysmal atrial fibrillation) (HCC)   CAD in native artery   Chronic systolic CHF (congestive heart failure) (HCC)   Hypothyroidism   Pleural effusion   Constipation   Pyuria   Dementia (HCC)   Acidosis  1 acute on chronic renal failure stage III/acidosis Questionable etiology.  Likely secondary to prerenal azotemia in the setting of diuretics, NSAID use.  Level 99.  Urine creatinine of 39.10.  UA trace leukocytes nitrite -11-20 WBCs.  Urine cultures positive for  pseudomonas aeruginosa.  CT abdomen and pelvis negative for obstruction or hydronephrosis.  IV fluids have been saline locked.  Creatinine at 2.68 from 2.96 from 3.18 from 3.71 from 4.03 from 4.46 on admission.  Last creatinine was 1.82 on 09/01/2018.  Patient with a urine output of 1000 cc over the past 24 hours.  Discontinued IV Rocephin and started IV cefepime as patient with a Pseudomonas UTI.  Discontinued bicarb drip as acidosis seem to have resolved.  Patient now with some shortness of breath and as such we will place on Lasix 20 mg IV every 12 hours x2 doses then resume home regimen oral Lasix tomorrow.  No NSAIDs.  Ranexa discontinued.  Follow.   2.  Paroxysmal atrial fibrillation Coreg for rate control.  Continue aspirin and Plavix.  Follow.   3.  Acute on chronic systolic heart failure/CAD Patient with complaints of shortness of breath on minimal exertion.  Physical exam with decreased breath sounds in the bases and some bibasilar crackles.  Patient was hydrated with IV fluids due to worsening renal function on admission.  IV fluids have been saline locked as of 11/19/2018.  Patient's diuretics have been held since admission.  Placed on Lasix 20 mg IV every 12 hours x2 doses and resume home regimen Lasix tomorrow.  Continue Coreg, aspirin, Plavix, Imdur, Lipitor.  4.  Hypothyroidism Continue home dose Synthroid.  5.  Constipation Likely etiology of  patient's abdominal pain and concern for inguinal hernia.  CT abdomen and pelvis negative for hernia.  Patient with large bowel movements per RN.  Patient refusing Dulcolax suppositories and oral MiraLAX.  Patient with some complaints of abdominal discomfort on 11/19/2018 which is since improving.  Continue Dulcolax suppositories daily, MiraLAX, Senokot-S.  Will need a bowel regimen on discharge.   6.  Chronic pleural effusion Stable.  Monitor closely with volume overload.  Saline lock IV fluids.  7.  Dementia Stable.  8.  Pseudomonas  aeruginosa UTI Urine cultures consistent with pseudomonas aeruginosa.  Sensitivities to ceftazidime, Cipro, gent, imipenem, Zosyn, cefepime.  Continue IV cefepime.  9 chronic anemia Hemoglobin currently at 7.4 from 7.8 from 7.5 from 8.0 from 8.5.  Likely dilutional.  Anemia panel consistent with anemia of chronic disease.  Patient denies any overt bleeding. Transfusion threshold hemoglobin less than 7.  Saline lock IV fluids.  10.  Probable neuropathy of feet Patient started on Neurontin 100 mg daily.  11.  Shortness of breath Likely secondary to hydration since admission due to worsening renal function.  IV fluids have been saline locked.  Check a chest x-ray.  Lasix 20 mg IV every 12 hours x2 doses.  Resume home regimen oral Lasix tomorrow if renal function has not worsened and stable.   DVT prophylaxis: scd Code Status: DNR Family Communication: updated patient.  No family at bedside.  Disposition Plan: Back to SNF once renal function back to baseline.   Consultants:   None  Procedures:   CT abd/pelvis 11/15/2018.  Antimicrobials:  IV Rocephin 11/15/2018>>>> 11/17/2018  IV cefepime 11/17/2018   Subjective: Patient laying in bed.  Patient with complaints of shortness of breath on exertion to the bathroom today.  Denies any abdominal pain.  Denies any chest pain.  Dysuria improving  Objective: Vitals:   11/19/18 2011 11/20/18 0507 11/20/18 0823 11/20/18 1015  BP: (!) 151/76 (!) 149/78  (!) 170/78  Pulse: 68 60  60  Resp: 17 18    Temp: 98.4 F (36.9 C) 98.7 F (37.1 C)    TempSrc: Oral Oral    SpO2: 100% 96% 92% 99%  Weight:  74.8 kg    Height:        Intake/Output Summary (Last 24 hours) at 11/20/2018 1141 Last data filed at 11/20/2018 1000 Gross per 24 hour  Intake 307.67 ml  Output 1000 ml  Net -692.33 ml   Filed Weights   11/18/18 0552 11/19/18 0500 11/20/18 0507  Weight: 59.6 kg 72.1 kg 74.8 kg    Examination:  General exam: NAD Respiratory system:  Bibasilar crackles.  No wheezing.  No rhonchi.  Normal respiratory effort.  Cardiovascular system: RRR  With 3/6 SEM . No JVD, no lower extremity edema.  No murmurs rubs or gallops.  Gastrointestinal system: Abdomen is nontender, nondistended, soft, positive bowel sounds.  No rebound.  No guarding.  Central nervous system: Alert and oriented. No focal neurological deficits. Extremities: Symmetric 5 x 5 power. Skin: No rashes, lesions or ulcers Psychiatry: Judgement and insight appear normal. Mood & affect appropriate.     Data Reviewed: I have personally reviewed following labs and imaging studies  CBC: Recent Labs  Lab 11/15/18 1603 11/16/18 0559 11/17/18 0619 11/17/18 1447 11/18/18 0655 11/20/18 0647  WBC 4.3 3.6* 3.9*  --  4.0  --   NEUTROABS 3.0  --   --   --   --   --   HGB 8.5* 8.0* 7.5* 7.8* 7.8* 7.4*  HCT 27.9* 25.9* 24.3* 25.4* 25.1* 23.7*  MCV 99.6 98.5 100.0  --  97.7  --   PLT 149* 133* 129*  --  149*  --    Basic Metabolic Panel: Recent Labs  Lab 11/16/18 0559 11/17/18 0619 11/18/18 0655 11/19/18 0702 11/20/18 0647  NA 138 136 139 141 142  K 4.9 5.0 5.1 4.6 4.4  CL 113* 111 109 103 106  CO2 16* 15* 20* 30 27  GLUCOSE 83 95 87 97 120*  BUN 85* 78* 67* 63* 59*  CREATININE 4.03* 3.71* 3.18* 2.96* 2.68*  CALCIUM 8.7* 8.5* 8.4* 8.1* 8.2*  PHOS  --  4.2 3.4 3.1 2.9   GFR: Estimated Creatinine Clearance: 17.6 mL/min (A) (by C-G formula based on SCr of 2.68 mg/dL (H)). Liver Function Tests: Recent Labs  Lab 11/15/18 1603 11/17/18 0619 11/18/18 0655 11/19/18 0702 11/20/18 0647  AST 11*  --   --   --   --   ALT 7  --   --   --   --   ALKPHOS 55  --   --   --   --   BILITOT 0.8  --   --   --   --   PROT 7.9  --   --   --   --   ALBUMIN 3.0* 2.9* 2.6* 2.4* 2.3*   No results for input(s): LIPASE, AMYLASE in the last 168 hours. No results for input(s): AMMONIA in the last 168 hours. Coagulation Profile: No results for input(s): INR, PROTIME in the last  168 hours. Cardiac Enzymes: No results for input(s): CKTOTAL, CKMB, CKMBINDEX, TROPONINI in the last 168 hours. BNP (last 3 results) No results for input(s): PROBNP in the last 8760 hours. HbA1C: No results for input(s): HGBA1C in the last 72 hours. CBG: No results for input(s): GLUCAP in the last 168 hours. Lipid Profile: No results for input(s): CHOL, HDL, LDLCALC, TRIG, CHOLHDL, LDLDIRECT in the last 72 hours. Thyroid Function Tests: No results for input(s): TSH, T4TOTAL, FREET4, T3FREE, THYROIDAB in the last 72 hours. Anemia Panel: Recent Labs    11/19/18 0702  VITAMINB12 300   Sepsis Labs: No results for input(s): PROCALCITON, LATICACIDVEN in the last 168 hours.  Recent Results (from the past 240 hour(s))  Urine culture     Status: Abnormal   Collection Time: 11/15/18  6:26 PM  Result Value Ref Range Status   Specimen Description   Final    URINE, RANDOM Performed at [facility] [address] [place], [place] [address]    Special Requests   Final    NONE Performed at [facility], [place] [address], [place], [place] [address]    Culture >=100,000 COLONIES/mL PSEUDOMONAS AERUGINOSA (A)  Final   Report Status 11/18/2018 FINAL  Final   Organism ID, Bacteria PSEUDOMONAS AERUGINOSA (A)  Final      Susceptibility   Pseudomonas aeruginosa - MIC*    CEFTAZIDIME 4 SENSITIVE Sensitive     CIPROFLOXACIN <=0.25 SENSITIVE Sensitive     GENTAMICIN <=1 SENSITIVE Sensitive     IMIPENEM 2 SENSITIVE Sensitive     PIP/TAZO <=4 SENSITIVE Sensitive     CEFEPIME 4 SENSITIVE Sensitive     * >=100,000 COLONIES/mL PSEUDOMONAS AERUGINOSA  MRSA PCR Screening     Status: None   Collection Time: 11/15/18 11:15 PM  Result Value Ref Range Status   MRSA by PCR NEGATIVE NEGATIVE Final    Comment:  The GeneXpert MRSA Assay (FDA approved for NASAL specimens only), is one component of a comprehensive MRSA colonization surveillance program. It is  not intended to diagnose MRSA infection nor to guide or monitor treatment for MRSA infections. Performed at [facility], [place] [address] [place], [place] [address]          Radiology Studies: No results found.      Scheduled Meds: . acetaminophen  650 mg Oral TID  . aspirin  81 mg Oral Daily  . atorvastatin  40 mg Oral QHS  . bisacodyl  10 mg Rectal Daily  . carvedilol  12.5 mg Oral BID WC  . clopidogrel  75 mg Oral Q breakfast  . cyanocobalamin  1,000 mcg Intramuscular Daily  . ezetimibe  10 mg Oral Daily  . feeding supplement (ENSURE ENLIVE)  237 mL Oral BID BM  . ferrous sulfate  325 mg Oral BID WC  . furosemide  20 mg Intravenous Q12H  . gabapentin  100 mg Oral Daily  . hydrocortisone cream   Topical BID  . ipratropium-albuterol  3 mL Nebulization BID  . isosorbide mononitrate  90 mg Oral Daily  . latanoprost  1 drop Both Eyes Once per day on Mon Wed  . levothyroxine  125 mcg Oral QAC breakfast  . mirtazapine  7.5 mg Oral QHS  . pantoprazole  40 mg Oral Daily  . polyethylene glycol  17 g Oral Daily  . senna-docusate  1 tablet Oral QHS  . tamsulosin  0.4 mg Oral Daily   Continuous Infusions: . ceFEPime (MAXIPIME) IV 2 g (11/19/18 1714)     LOS: 5 days    Time spent: 35 minutes    Irine Seal, MD [facility] Pager [phone]  If 7PM-7AM, please contact night-coverage [email or web address] Password [email or web address] 11/20/2018, 11:40 AM

## 2018-11-20 NOTE — Plan of Care (Signed)
  Problem: Nutrition: Goal: Adequate nutrition will be maintained Outcome: Progressing   Problem: Elimination: Goal: Will not experience complications related to bowel motility Outcome: Progressing   Problem: Elimination: Goal: Will not experience complications related to urinary retention Outcome: Progressing   Problem: Safety: Goal: Ability to remain free from injury will improve Outcome: Progressing

## 2018-11-21 ENCOUNTER — Inpatient Hospital Stay (HOSPITAL_COMMUNITY): Payer: Medicare HMO

## 2018-11-21 ENCOUNTER — Other Ambulatory Visit: Payer: Self-pay

## 2018-11-21 ENCOUNTER — Encounter (HOSPITAL_COMMUNITY): Payer: Self-pay | Admitting: Radiology

## 2018-11-21 ENCOUNTER — Ambulatory Visit (HOSPITAL_COMMUNITY): Payer: Medicare HMO

## 2018-11-21 DIAGNOSIS — I5023 Acute on chronic systolic (congestive) heart failure: Secondary | ICD-10-CM | POA: Diagnosis not present

## 2018-11-21 DIAGNOSIS — R471 Dysarthria and anarthria: Secondary | ICD-10-CM

## 2018-11-21 DIAGNOSIS — R0602 Shortness of breath: Secondary | ICD-10-CM

## 2018-11-21 LAB — GLUCOSE, CAPILLARY: Glucose-Capillary: 105 mg/dL — ABNORMAL HIGH (ref 70–99)

## 2018-11-21 LAB — CBC
HCT: 24 % — ABNORMAL LOW (ref 39.0–52.0)
HEMOGLOBIN: 7.5 g/dL — AB (ref 13.0–17.0)
MCH: 30.5 pg (ref 26.0–34.0)
MCHC: 31.3 g/dL (ref 30.0–36.0)
MCV: 97.6 fL (ref 80.0–100.0)
Platelets: 122 10*3/uL — ABNORMAL LOW (ref 150–400)
RBC: 2.46 MIL/uL — ABNORMAL LOW (ref 4.22–5.81)
RDW: 17.2 % — ABNORMAL HIGH (ref 11.5–15.5)
WBC: 5.2 10*3/uL (ref 4.0–10.5)
nRBC: 0 % (ref 0.0–0.2)

## 2018-11-21 LAB — RENAL FUNCTION PANEL
Albumin: 2.5 g/dL — ABNORMAL LOW (ref 3.5–5.0)
Anion gap: 9 (ref 5–15)
BUN: 62 mg/dL — AB (ref 8–23)
CO2: 29 mmol/L (ref 22–32)
Calcium: 8.5 mg/dL — ABNORMAL LOW (ref 8.9–10.3)
Chloride: 104 mmol/L (ref 98–111)
Creatinine, Ser: 2.59 mg/dL — ABNORMAL HIGH (ref 0.61–1.24)
GFR calc Af Amer: 24 mL/min — ABNORMAL LOW (ref >60)
GFR calc non Af Amer: 21 mL/min — ABNORMAL LOW (ref >60)
GLUCOSE: 96 mg/dL (ref 70–99)
Phosphorus: 3.2 mg/dL (ref 2.5–4.6)
Potassium: 4.6 mmol/L (ref 3.5–5.1)
SODIUM: 142 mmol/L (ref 135–145)

## 2018-11-21 LAB — TSH: TSH: 1.859 u[IU]/mL (ref 0.350–4.500)

## 2018-11-21 LAB — MAGNESIUM: Magnesium: 2 mg/dL (ref 1.7–2.4)

## 2018-11-21 LAB — T4, FREE: FREE T4: 0.99 ng/dL (ref 0.82–1.77)

## 2018-11-21 MED ORDER — ACETAMINOPHEN 160 MG/5ML PO SOLN
650.0000 mg | ORAL | Status: DC | PRN
  Administered 2018-11-29: 650 mg
  Filled 2018-11-21: qty 20.3

## 2018-11-21 MED ORDER — SENNOSIDES-DOCUSATE SODIUM 8.6-50 MG PO TABS: 1.0000 | ORAL_TABLET | Freq: Every evening | ORAL | Status: DC | PRN

## 2018-11-21 MED ORDER — STROKE: EARLY STAGES OF RECOVERY BOOK
Freq: Once | Status: AC
  Administered 2018-11-21: 17:00:00
  Filled 2018-11-21: qty 1

## 2018-11-21 MED ORDER — FUROSEMIDE 20 MG PO TABS: 20.0000 mg | ORAL_TABLET | Freq: Two times a day (BID) | ORAL | Status: DC

## 2018-11-21 MED ORDER — FUROSEMIDE 10 MG/ML IJ SOLN: 20.0000 mg | Freq: Once | INTRAMUSCULAR | Status: DC

## 2018-11-21 MED ORDER — ACETAMINOPHEN 650 MG RE SUPP
650.0000 mg | RECTAL | Status: DC | PRN
  Administered 2018-11-22: 650 mg via RECTAL
  Filled 2018-11-21: qty 1

## 2018-11-21 MED ORDER — SODIUM CHLORIDE 0.9 % IV SOLN
INTRAVENOUS | Status: DC
  Administered 2018-11-21 – 2018-11-22 (×2): via INTRAVENOUS

## 2018-11-21 MED ORDER — LEVETIRACETAM IN NACL 500 MG/100ML IV SOLN
500.0000 mg | Freq: Once | INTRAVENOUS | Status: AC
  Administered 2018-11-21: 500 mg via INTRAVENOUS
  Filled 2018-11-21: qty 100

## 2018-11-21 MED ORDER — HEPARIN SODIUM (PORCINE) 5000 UNIT/ML IJ SOLN
5000.0000 [IU] | Freq: Three times a day (TID) | INTRAMUSCULAR | Status: DC
  Administered 2018-11-21 – 2018-11-29 (×16): 5000 [IU] via SUBCUTANEOUS
  Filled 2018-11-21 (×18): qty 1

## 2018-11-21 MED ORDER — ACETAMINOPHEN 325 MG PO TABS
650.0000 mg | ORAL_TABLET | ORAL | Status: DC | PRN
  Administered 2018-11-25: 650 mg via ORAL
  Filled 2018-11-21: qty 2

## 2018-11-21 MED ORDER — FUROSEMIDE 10 MG/ML IJ SOLN
20.0000 mg | Freq: Once | INTRAMUSCULAR | Status: AC
  Administered 2018-11-21: 20 mg via INTRAVENOUS
  Filled 2018-11-21: qty 2

## 2018-11-21 NOTE — Progress Notes (Signed)
PROGRESS NOTE    John Peck  NKN:[id number] DOB: 1924-05-21 DOA: 11/15/2018 PCP: Seward Carol, MD    Brief Narrative:  Per Dr. Wynetta Emery- Fowler Antos is a 85 y.o. male with medical history significant of chronic kidney disease, CHF, chronic anemia presented with right-sided inguinal hernia complaints.  It is been hurting him over the past 2 weeks.  Has been treated supportively at the nursing facility with Tylenol and ibuprofen.  Was no inguinal hernia on my or ER clinicians physical exam.  It was also not seen on abdominal pelvic CT.  Patient denied any chest pain shortness of breath, or  fever.  He does follow a fluid restricted diet at the facility.  He also eats a mechanical soft diet, and  was not eating very well so family has been giving him additional food to entice him to eat as well.  Patient was unsure if he has had a urinary changes.  Had a bowel movements morning but it was small. ED Course: Patient CT scan of abdomen and pelvis that did not show any inguinal hernia.  Also did not show any stones or hydronephrosis.  Patient does have a chronic right-sided pleural effusion that was present again today.  He was found to have acute on chronic renal failure with a creatinine of 4.4 with prior being 1.8.  He also had some white cells in his urine and has had previous UTI in the past.  Patient noted on 11/21/2018 to have dysarthric speech and code stroke has been activated.   Assessment & Plan:   Principal Problem:   Dysarthria Active Problems:   Renal failure (ARF), acute on chronic (HCC)   PAF (paroxysmal atrial fibrillation) (HCC)   CAD in native artery   Chronic systolic CHF (congestive heart failure) (HCC)   Hypothyroidism   Pleural effusion   Constipation   Pyuria   Dementia (HCC)   Acidosis   Acute on chronic systolic CHF (congestive heart failure) ([facility])  #1 dysarthric speech/??  Code stroke Patient noted to have dysarthric speech over the past 30 minutes  between 130p to 2 PM.  Patient noted to have comprehensible speech earlier on this morning and with physical therapy.  Patient with complaints of twitching early on this morning however did not have any focal neurological deficits at that time.  Patient now with dysarthric speech.  Code stroke to be activated.  Stat head CT without contrast.  Patient with pacemaker and as such unable to get an MRI.  Stroke pathway to be ordered including CT angiogram head and neck, 2D echo, carotid Dopplers, fasting lipid panel, hemoglobin A1c, PT/OT/SLP.  Telemetry neurology to assess patient.  Spoke with neuro hospitalist.  Patient has already received aspirin and Plavix this morning.  Make patient n.p.o. until patient passes swallow evaluation.  Formal neurological consultation pending.   2 acute on chronic renal failure stage III/acidosis Questionable etiology.  Likely secondary to prerenal azotemia in the setting of diuretics, NSAID use.  Level 99.  Urine creatinine of 39.10.  UA trace leukocytes nitrite -11-20 WBCs.  Urine cultures positive for pseudomonas aeruginosa.  CT abdomen and pelvis negative for obstruction or hydronephrosis.  IV fluids have been saline locked.  Creatinine at 2.59 from 2.68 from 2.96 from 3.18 from 3.71 from 4.03 from 4.46 on admission.  Last creatinine was 1.82 on 09/01/2018.  Patient with a urine output of 1500 cc over the past 24 hours after 2 doses of IV Lasix.  Discontinued IV  Rocephin and started IV cefepime as patient with a Pseudomonas UTI.  Discontinued bicarb drip as acidosis seem to have resolved.  Patient with improvement with shortness of breath and patient started on home dose oral Lasix.  We will give extra dose of Lasix 20 mg IV x1.  No NSAIDs.  Ranexa discontinued.  Follow.   3.  Paroxysmal atrial fibrillation Continue Coreg for rate control.  Continue aspirin and Plavix.  Follow.   4.  Acute on chronic systolic heart failure/CAD Patient with complaints of shortness of breath  on minimal exertion on 11/20/2018.  Physical exam with decreased breath sounds in the bases and decreased bibasilar crackles.  Patient was hydrated with IV fluids due to worsening renal function on admission.  IV fluids have been saline locked as of 11/19/2018.  Patient's diuretics have been held since admission.  Placed on Lasix 20 mg IV every 12 hours x2 on 11/20/2018 with urine output of 1.5 L.  Clinically improved.  Chest x-ray consistent with volume overload.  Patient started on home dose oral Lasix this morning.  Give extra dose of Lasix 20 mg IV x1.  Continue Coreg, aspirin, Plavix, Imdur, Lipitor.  5.  Hypothyroidism Continue home dose Synthroid.  6.  Constipation Likely etiology of patient's abdominal pain and concern for inguinal hernia.  CT abdomen and pelvis negative for hernia.  Patient with large bowel movements per RN.  Patient refusing Dulcolax suppositories and oral MiraLAX.  Patient with some complaints of abdominal discomfort on 11/19/2018 which is since improving.  Continue Dulcolax suppositories daily, MiraLAX, Senokot-S.  Will need a bowel regimen on discharge.   7.  Chronic pleural effusion Stable.  Monitor closely with volume overload.  Saline lock IV fluids.  8.  Dementia Stable.  9.  Pseudomonas aeruginosa UTI Urine cultures consistent with pseudomonas aeruginosa.  Sensitivities to ceftazidime, Cipro, gent, imipenem, Zosyn, cefepime.  Continue IV cefepime.  10 chronic anemia Hemoglobin currently at 7.5 from 7.4 from 7.8 from 7.5 from 8.0 from 8.5.  Likely dilutional.  Anemia panel consistent with anemia of chronic disease.  Patient denies any overt bleeding. Transfusion threshold hemoglobin less than 7.  Saline lock IV fluids.  11.  Probable neuropathy of feet Patient started on Neurontin 100 mg daily.  12.  Shortness of breath Likely secondary to hydration since admission due to worsening renal function.  IV fluids have been saline locked.  Chest x-ray compatible with  volume overload.  Patient given Lasix 20 mg IV every 12 hours x2 doses with good urine output.  Patient with a urine output of 1.5 L over the past 24 hours.  Improved clinically.  Patient's home dose oral Lasix resumed this morning.  We will give another dose of Lasix 20 mg IV x1.    DVT prophylaxis: scd Code Status: DNR Family Communication: updated patient.  No family at bedside.  Disposition Plan: Back to SNF once renal function back to baseline.   Consultants:   None  Procedures:   CT abd/pelvis 11/15/2018.  Antimicrobials:  IV Rocephin 11/15/2018>>>> 11/17/2018  IV cefepime 11/17/2018   Subjective: Patient seen earlier on this morning around 10:15 AM.  At time patient complaining of twitching all over which she stated had been ongoing for years but had worsened this morning.  Patient denied any chest pain.  Patient states shortness of breath that improved from yesterday.  Family came to desk and asked patient to be reassessed.  Went to the room patient noted to be dysarthric which family  states suddenly occurred over the past 30 minutes.  Patient with dysarthric speech.  Following some commands.  Moving extremities spontaneously.  Not following all commands.  Family at bedside.    Objective: Vitals:   11/21/18 0051 11/21/18 0514 11/21/18 0852 11/21/18 1243  BP: (!) 144/67 (!) 146/75  (!) 177/71  Pulse: 91 81  82  Resp: 18 19  20   Temp: (!) 97.3 F (36.3 C) (!) 97.5 F (36.4 C)  (!) 97.5 F (36.4 C)  TempSrc: Oral Oral  Oral  SpO2:  96% 91% 96%  Weight:  77.1 kg    Height:        Intake/Output Summary (Last 24 hours) at 11/21/2018 1411 Last data filed at 11/21/2018 1042 Gross per 24 hour  Intake 440 ml  Output 1400 ml  Net -960 ml   Filed Weights   11/20/18 0507 11/20/18 1100 11/21/18 0514  Weight: 74.8 kg 71.2 kg 77.1 kg    Examination:  General exam: Dysarthric speech. Respiratory system: Decreased bibasilar crackles.  No wheezing, no crackles, no rhonchi.   Normal respiratory effort.  Cardiovascular system: RRR  With 3/6 SEM . No JVD, no lower extremity edema.  No murmurs rubs or gallops.  Gastrointestinal system: Abdomen is nontender, nondistended, soft, positive bowel sounds.  No rebound.  No guarding.  Central nervous system: Patient with dysarthric speech.  Moving extremities spontaneously.  Twitching diffusely.  Incomprehensible speech.   Extremities: Symmetric 5 x 5 power. Skin: No rashes, lesions or ulcers Psychiatry: Judgement and insight appear normal. Mood & affect appropriate.     Data Reviewed: I have personally reviewed following labs and imaging studies  CBC: Recent Labs  Lab 11/15/18 1603 11/16/18 0559 11/17/18 0619 11/17/18 1447 11/18/18 0655 11/20/18 0647 11/21/18 0645  WBC 4.3 3.6* 3.9*  --  4.0  --  5.2  NEUTROABS 3.0  --   --   --   --   --   --   HGB 8.5* 8.0* 7.5* 7.8* 7.8* 7.4* 7.5*  HCT 27.9* 25.9* 24.3* 25.4* 25.1* 23.7* 24.0*  MCV 99.6 98.5 100.0  --  97.7  --  97.6  PLT 149* 133* 129*  --  149*  --  841*   Basic Metabolic Panel: Recent Labs  Lab 11/17/18 0619 11/18/18 0655 11/19/18 0702 11/20/18 0647 11/21/18 0645  NA 136 139 141 142 142  K 5.0 5.1 4.6 4.4 4.6  CL 111 109 103 106 104  CO2 15* 20* 30 27 29   GLUCOSE 95 87 97 120* 96  BUN 78* 67* 63* 59* 62*  CREATININE 3.71* 3.18* 2.96* 2.68* 2.59*  CALCIUM 8.5* 8.4* 8.1* 8.2* 8.5*  MG  --   --   --   --  2.0  PHOS 4.2 3.4 3.1 2.9 3.2   GFR: Estimated Creatinine Clearance: 18.2 mL/min (A) (by C-G formula based on SCr of 2.59 mg/dL (H)). Liver Function Tests: Recent Labs  Lab 11/15/18 1603 11/17/18 0619 11/18/18 0655 11/19/18 0702 11/20/18 0647 11/21/18 0645  AST 11*  --   --   --   --   --   ALT 7  --   --   --   --   --   ALKPHOS 55  --   --   --   --   --   BILITOT 0.8  --   --   --   --   --   PROT 7.9  --   --   --   --   --  ALBUMIN 3.0* 2.9* 2.6* 2.4* 2.3* 2.5*   No results for input(s): LIPASE, AMYLASE in the last 168  hours. No results for input(s): AMMONIA in the last 168 hours. Coagulation Profile: No results for input(s): INR, PROTIME in the last 168 hours. Cardiac Enzymes: No results for input(s): CKTOTAL, CKMB, CKMBINDEX, TROPONINI in the last 168 hours. BNP (last 3 results) No results for input(s): PROBNP in the last 8760 hours. HbA1C: No results for input(s): HGBA1C in the last 72 hours. CBG: No results for input(s): GLUCAP in the last 168 hours. Lipid Profile: No results for input(s): CHOL, HDL, LDLCALC, TRIG, CHOLHDL, LDLDIRECT in the last 72 hours. Thyroid Function Tests: Recent Labs    11/21/18 1032  TSH 1.859  FREET4 0.99   Anemia Panel: Recent Labs    11/19/18 0702  VITAMINB12 300   Sepsis Labs: No results for input(s): PROCALCITON, LATICACIDVEN in the last 168 hours.  Recent Results (from the past 240 hour(s))  Urine culture     Status: Abnormal   Collection Time: 11/15/18  6:26 PM  Result Value Ref Range Status   Specimen Description   Final    URINE, RANDOM Performed at [facility] [address], [place], [place] [address]    Special Requests   Final    NONE Performed at [facility], [place] [address] [place], [place] [address]    Culture >=100,000 COLONIES/mL PSEUDOMONAS AERUGINOSA (A)  Final   Report Status 11/18/2018 FINAL  Final   Organism ID, Bacteria PSEUDOMONAS AERUGINOSA (A)  Final      Susceptibility   Pseudomonas aeruginosa - MIC*    CEFTAZIDIME 4 SENSITIVE Sensitive     CIPROFLOXACIN <=0.25 SENSITIVE Sensitive     GENTAMICIN <=1 SENSITIVE Sensitive     IMIPENEM 2 SENSITIVE Sensitive     PIP/TAZO <=4 SENSITIVE Sensitive     CEFEPIME 4 SENSITIVE Sensitive     * >=100,000 COLONIES/mL PSEUDOMONAS AERUGINOSA  MRSA PCR Screening     Status: None   Collection Time: 11/15/18 11:15 PM  Result Value Ref Range Status   MRSA by PCR NEGATIVE NEGATIVE Final    Comment:        The GeneXpert MRSA Assay (FDA approved for  NASAL specimens only), is one component of a comprehensive MRSA colonization surveillance program. It is not intended to diagnose MRSA infection nor to guide or monitor treatment for MRSA infections. Performed at [facility], [place] [address], [place], [place] [address]          Radiology Studies: Dg Chest Port 1 View  Result Date: 11/20/2018 CLINICAL DATA:  Acute shortness of breath and generalized weakness. EXAM: PORTABLE CHEST 1 VIEW COMPARISON:  08/30/2018 and earlier. FINDINGS: Sternotomy for CABG. Cardiac silhouette moderately enlarged, unchanged. LEFT subclavian dual lead transvenous pacemaker unchanged and appears intact. Mild diffuse interstitial pulmonary edema superimposed upon baseline changes of emphysema. Chronic or recurrent BILATERAL pleural effusions, RIGHT greater than LEFT, with associated passive atelectasis in the RIGHT LOWER LOBE. IMPRESSION: Mild acute CHF, with stable moderate cardiomegaly and mild diffuse interstitial pulmonary edema. Chronic or recurrent BILATERAL pleural effusions, RIGHT greater than LEFT. Electronically Signed   By: Evangeline Dakin M.D.   On: 11/20/2018 20:45        Scheduled Meds: . acetaminophen  650 mg Oral TID  . aspirin  81 mg Oral Daily  . atorvastatin  40 mg Oral QHS  . bisacodyl  10 mg Rectal Daily  . carvedilol  12.5  mg Oral BID WC  . clopidogrel  75 mg Oral Q breakfast  . cyanocobalamin  1,000 mcg Intramuscular Daily  . ezetimibe  10 mg Oral Daily  . feeding supplement (ENSURE ENLIVE)  237 mL Oral BID BM  . ferrous sulfate  325 mg Oral BID WC  . furosemide  20 mg Intravenous Once  . [START ON 11/22/2018] furosemide  20 mg Oral BID  . gabapentin  100 mg Oral Daily  . hydrocortisone cream   Topical BID  . ipratropium-albuterol  3 mL Nebulization BID  . isosorbide mononitrate  90 mg Oral Daily  . latanoprost  1 drop Both Eyes Once per day on Mon Wed  . levothyroxine  125 mcg Oral QAC breakfast  .  mirtazapine  7.5 mg Oral QHS  . pantoprazole  40 mg Oral Daily  . polyethylene glycol  17 g Oral Daily  . senna-docusate  1 tablet Oral QHS  . tamsulosin  0.4 mg Oral Daily   Continuous Infusions: . sodium chloride    . ceFEPime (MAXIPIME) IV Stopped (11/20/18 1824)     LOS: 6 days    Time spent: 45 minutes    Irine Seal, MD [facility] Pager [phone]  If 7PM-7AM, please contact night-coverage [email or web address] Password [facility] 11/21/2018, 2:11 PM

## 2018-11-21 NOTE — Progress Notes (Signed)
Physical Therapy Treatment Patient Details Name: John Peck MRN: [id number] DOB: October 30, 1924 Today's Date: 11/21/2018    History of Present Illness 85 y.o. male admitted to ED on 11/15/18 from [facility] for ARF, groin pain.  PMH includes CAD with multiple cardiac caths as well as CABG, HTN, CKD stage III, hypothyroidism, dementia, pleural effusion,  anemia, chronic heart failure, stroke, COPD, dyspnea, PAD/PVD, and HLD.    PT Comments    Pt required more assist this session to perform bed mobility tasks. Pt also with posterior leaning in sitting,  PT and PT aide assistance required to maintain upright. Pt with total body jerking sporadically throughout session, which was not present last visit. Pt's daughter at bedside. PT to continue to follow pt acutely.    Follow Up Recommendations  SNF     Equipment Recommendations  None recommended by PT    Recommendations for Other Services       Precautions / Restrictions Precautions Precautions: Fall Restrictions Weight Bearing Restrictions: No    Mobility  Bed Mobility Overal bed mobility: Needs Assistance Bed Mobility: Supine to Sit     Supine to sit: Mod assist;Max assist;+2 for physical assistance;+2 for safety/equipment     General bed mobility comments: Max assist for supine<>sit +2, for trunk elevation, LE management, and scooting to EOB with use of bed pad. Pt with LE and UE jerking motions throughout session, new to PT. Pt with posterior leaning unable to be corrected with PT's verbal or tactile cues. Pt required mod-max assist to sit EOB for ~5 minutes, unable to sit without PT support and pt's use of UEs. Pt required increased time to place hand on bedrail for support, and was unable to ungrip/remove hand from bedrail so had to be removed by PT once supine.   Transfers Overall transfer level: Needs assistance Equipment used: Rolling walker (2 wheeled) Transfers: (NT - pt requiring mod-max assist for sitting EOB)               Ambulation/Gait                 Stairs             Wheelchair Mobility    Modified Rankin (Stroke Patients Only)       Balance Overall balance assessment: Needs assistance Sitting-balance support: Bilateral upper extremity supported;Feet supported Sitting balance-Leahy Scale: Poor Sitting balance - Comments: requires mod-max assist for sitting EOB                                    Cognition Arousal/Alertness: Awake/alert Behavior During Therapy: WFL for tasks assessed/performed Overall Cognitive Status: Impaired/Different from baseline Area of Impairment: Orientation;Following commands;Safety/judgement                 Orientation Level: Disoriented to;Person     Following Commands: Follows one step commands inconsistently;Follows one step commands with increased time Safety/Judgement: Decreased awareness of deficits;Decreased awareness of safety     General Comments: Pt struggled to remember birthday and name of daughter at bedside initially, able to arrive at correct answer with multiple attmepts.       Exercises General Exercises - Lower Extremity Ankle Circles/Pumps: PROM;Both;15 reps;Supine(emphasis on calf stretch ) Hip Flexion/Marching: PROM;Both;10 reps;Supine(hip and knee flexion, hip and knee extension)    General Comments General comments (skin integrity, edema, etc.): Pt with total body jerking movements sporadically throughout session which were not  present last PT session, most noticable with movement but also present at rest. Per RN, this was present yesterday.       Pertinent Vitals/Pain Pain Assessment: Faces Faces Pain Scale: Hurts a little bit Pain Location: feet (ingrown toenail)  Pain Descriptors / Indicators: Aching;Sore Pain Intervention(s): Limited activity within patient's tolerance;Monitored during session;Repositioned    Home Living                      Prior Function             PT Goals (current goals can now be found in the care plan section) Acute Rehab PT Goals Patient Stated Goal: get more steady, walk  PT Goal Formulation: With patient Time For Goal Achievement: 12/01/18 Potential to Achieve Goals: Good Progress towards PT goals: Not progressing toward goals - comment(pt with regression in mobility this session vs last week)    Frequency    Min 2X/week      PT Plan Current plan remains appropriate    Co-evaluation              AM-PAC PT "6 Clicks" Mobility   Outcome Measure  Help needed turning from your back to your side while in a flat bed without using bedrails?: A Lot Help needed moving from lying on your back to sitting on the side of a flat bed without using bedrails?: A Lot Help needed moving to and from a bed to a chair (including a wheelchair)?: Total Help needed standing up from a chair using your arms (e.g., wheelchair or bedside chair)?: Total Help needed to walk in hospital room?: Total Help needed climbing 3-5 steps with a railing? : Total 6 Click Score: 8    End of Session   Activity Tolerance: Patient limited by fatigue Patient left: with call bell/phone within reach;in bed;with family/visitor present;with SCD's reapplied Nurse Communication: Mobility status PT Visit Diagnosis: Other abnormalities of gait and mobility (R26.89);Muscle weakness (generalized) (M62.81)     Time: 7356-7014 PT Time Calculation (min) (ACUTE ONLY): 22 min  Charges:  $Neuromuscular Re-education: 8-22 mins                     John Peck, PT Acute Rehabilitation Services Pager [phone]  Office [phone]   Jarell Mcewen D Elonda Husky 11/21/2018, 3:12 PM

## 2018-11-21 NOTE — Consult Note (Addendum)
[facility]urology Consult Services   Date of Service:   11/21/2018 14:23:56  Impression:     .  RO Acute Ischemic Stroke     .  vs toxic metabolic encephalopathy and myoclonic jerks     .  vs seizure  Comments: patient with history of hypertension, Atrial Fibrillation not on anticoagulation, UTI, noticed to have some body jerking yesterday which has progressed and today started having slurred speech and lethargy, more likely to be a toxic metabolic encephalopathy with myoclonic jerks vs seizures vs less likely stroke (considering he is not anticoagulated), Head CT: No acute Intracranial abnormality., had a long discussed with family (wife and daughters) regarding investigating for Large Vessel Occlusion and family decides to withhold for now.  Mechanism of Stroke: Possible Thromboembolic Possible Cardioembolic Small Vessel Disease  Metrics: Last Known Well: Unknown [facility] Notification Time: 11/21/2018 14:23:56 Stamp Time: 11/21/2018 14:23:56 Time First Login Attempt: 11/21/2018 14:33:11 Video Start Time: 11/21/2018 14:33:11  Symptoms: slurred speech, lethargy NIHSS Start Assessment Time: 11/21/2018 14:40:00 Patient is not a candidate for tPA. Patient was not deemed candidate for tPA thrombolytics because of Last Well Known Above 4.5 Hours. Video End Time: 11/21/2018 15:17:40  CT head showed no acute hemorrhage or acute core infarct. CT head was reviewed.  Advanced imaging was not obtained as family decision.    Our recommendations are outlined below.  Recommendations:     .  Activate Stroke Protocol Admission/Order Set     .  Stroke/Telemetry Floor     .  Neuro Checks     .  Bedside Swallow Eval     .  DVT Prophylaxis     .  IV Fluids, Normal Saline     .  Head of Bed Below 30 Degrees     .  Euglycemia and Avoid Hyperthermia (PRN Acetaminophen)     .  Antiplatelet Therapy Recommended     .  will load on keppra 500mg  IV x1 (renally  dosed)     .  STAT EEG     .  antibiotic therapy per primary team  Routine Consultation with [facility] for Follow up Care  Sign Out:     .  Discussed with rapid response team    ------------------------------------------------------------------------------  History of Present Illness: Patient is a 85 year old Male.  Inpatient stroke alert was called for symptoms of slurred speech, lethargy  with history of inguinal hernia (reason for admission), UTI, Atrial Fibrillation, pacemaker, remote GI bleeding, prior CVA, is on aspirin. last known well: unknown but some time yesterday he started having some torso jerking movements, today was noticed to be more lethargic and had dysarthria, also same twitching movements seen in tongue.  CT head showed no acute hemorrhage or acute core infarct. CT head was reviewed.  Last seen normal was beyond 4.5 hours of presentation.  Examination: 1A: Level of Consciousness - Requires repeated stimulation to arouse + 2 1B: Ask Month and Age - Aphasic + 2 1C: Blink Eyes & Squeeze Hands - Performs Both Tasks + 0 2: Test Horizontal Extraocular Movements - Normal + 0 3: Test Visual Fields - No Visual Loss + 0 4: Test Facial Palsy (Use Grimace if Obtunded) - Normal symmetry + 0 5A: Test Left Arm Motor Drift - Drift, hits bed + 2 5B: Test Right Arm Motor Drift - Drift, hits bed + 2 6A: Test Left Leg Motor Drift - Drift, hits bed + 2 6B: Test Right Leg Motor Drift -  Drift, hits bed + 2 7: Test Limb Ataxia (FNF/Heel-Shin) - No Ataxia + 0 8: Test Sensation - Normal; No sensory loss + 0 9: Test Language/Aphasia - Normal; No aphasia + 0 10: Test Dysarthria - Normal + 0 11: Test Extinction/Inattention - No abnormality + 0  NIHSS Score: 12  Patient was informed the Neurology Consult would happen via TeleHealth consult by way of interactive audio and video telecommunications and consented to receiving care in this manner.  Due to the immediate  potential for life-threatening deterioration due to underlying acute neurologic illness, I spent 35 minutes providing critical care. This time includes time for face to face visit via telemedicine, review of medical records, imaging studies and discussion of findings with providers, the patient and/or family.   Dr Elenor Quinones   [facility] [phone]   Case [id number]

## 2018-11-21 NOTE — Progress Notes (Signed)
Bedside RN, Andee Poles, communicated that family did not want to pursue CT angio of head and neck at this time. MD Grandville Silos made aware and ordered to complete NIH scale every 4 hours until the following day. Will communicate this to Rapid Response on night shift.

## 2018-11-21 NOTE — Progress Notes (Signed)
Pt. A&O and incontinent for urin and bowel.pt. was groaning and constantly making noises. When this nurse asked him if he has pain ,Pt.denied any pain. A prevalon boots is utilized to prevent  foot droop.Pt.is in no apparent distress and continue to monitor.

## 2018-11-21 NOTE — Care Management Important Message (Signed)
Important Message  Patient Details  Name: John Peck MRN: [id number] Date of Birth: Apr 13, 1924   Medicare Important Message Given:  Yes    Kerin Salen 11/21/2018, 11:40 AMImportant Message  Patient Details  Name: John Peck MRN: [id number] Date of Birth: 09/16/1924   Medicare Important Message Given:  Yes    Kerin Salen 11/21/2018, 11:40 AM

## 2018-11-21 NOTE — Progress Notes (Signed)
SLP Cancellation Note  Patient Details Name: JERMY COUPER MRN: [id number] DOB: 21-Aug-1924   Cancelled treatment:       Reason Eval/Treat Not Completed: Patient at procedure or test/unavailable - Orders received for cognitive linguistic evaluation. Pt is currently off unit. Will continue efforts.  Celia B. Quentin Ore [facility], CCC-SLP Speech Language Pathologist [phone]  Shonna Chock 11/21/2018, 2:43 PM

## 2018-11-22 ENCOUNTER — Inpatient Hospital Stay (HOSPITAL_COMMUNITY): Payer: Medicare HMO

## 2018-11-22 ENCOUNTER — Inpatient Hospital Stay (HOSPITAL_COMMUNITY)
Admission: EM | Admit: 2018-11-22 | Discharge: 2018-11-22 | Disposition: A | Discharge status: Home / Self Care | Payer: Medicare HMO | Source: Home / Self Care | Attending: Neurology | Admitting: Neurology

## 2018-11-22 DIAGNOSIS — N390 Urinary tract infection, site not specified: Secondary | ICD-10-CM

## 2018-11-22 DIAGNOSIS — I370 Nonrheumatic pulmonary valve stenosis: Secondary | ICD-10-CM

## 2018-11-22 DIAGNOSIS — R471 Dysarthria and anarthria: Secondary | ICD-10-CM

## 2018-11-22 DIAGNOSIS — G9341 Metabolic encephalopathy: Secondary | ICD-10-CM

## 2018-11-22 DIAGNOSIS — R569 Unspecified convulsions: Secondary | ICD-10-CM

## 2018-11-22 DIAGNOSIS — I340 Nonrheumatic mitral (valve) insufficiency: Secondary | ICD-10-CM

## 2018-11-22 DIAGNOSIS — I361 Nonrheumatic tricuspid (valve) insufficiency: Secondary | ICD-10-CM

## 2018-11-22 LAB — RENAL FUNCTION PANEL
Albumin: 2.5 g/dL — ABNORMAL LOW (ref 3.5–5.0)
Anion gap: 10 (ref 5–15)
BUN: 57 mg/dL — ABNORMAL HIGH (ref 8–23)
CO2: 26 mmol/L (ref 22–32)
CREATININE: 2.55 mg/dL — AB (ref 0.61–1.24)
Calcium: 8.5 mg/dL — ABNORMAL LOW (ref 8.9–10.3)
Chloride: 109 mmol/L (ref 98–111)
GFR calc Af Amer: 24 mL/min — ABNORMAL LOW (ref >60)
GFR calc non Af Amer: 21 mL/min — ABNORMAL LOW (ref >60)
Glucose, Bld: 74 mg/dL (ref 70–99)
Phosphorus: 4 mg/dL (ref 2.5–4.6)
Potassium: 4.2 mmol/L (ref 3.5–5.1)
SODIUM: 145 mmol/L (ref 135–145)

## 2018-11-22 LAB — LIPID PANEL
Cholesterol: 91 mg/dL (ref 0–200)
HDL: 26 mg/dL — ABNORMAL LOW (ref >40)
LDL Cholesterol: 49 mg/dL (ref 0–99)
Total CHOL/HDL Ratio: 3.5 RATIO
Triglycerides: 78 mg/dL (ref <150)
VLDL: 16 mg/dL (ref 0–40)

## 2018-11-22 LAB — ECHOCARDIOGRAM COMPLETE
Height: 69 in
Weight: 2649.05 oz

## 2018-11-22 LAB — CBC
HCT: 24 % — ABNORMAL LOW (ref 39.0–52.0)
Hemoglobin: 7.3 g/dL — ABNORMAL LOW (ref 13.0–17.0)
MCH: 30.4 pg (ref 26.0–34.0)
MCHC: 30.4 g/dL (ref 30.0–36.0)
MCV: 100 fL (ref 80.0–100.0)
PLATELETS: 126 10*3/uL — AB (ref 150–400)
RBC: 2.4 MIL/uL — ABNORMAL LOW (ref 4.22–5.81)
RDW: 17 % — ABNORMAL HIGH (ref 11.5–15.5)
WBC: 4.9 10*3/uL (ref 4.0–10.5)
nRBC: 0 % (ref 0.0–0.2)

## 2018-11-22 LAB — HEMOGLOBIN A1C
Hgb A1c MFr Bld: 5.9 % — ABNORMAL HIGH (ref 4.8–5.6)
Mean Plasma Glucose: 122.63 mg/dL

## 2018-11-22 MED ORDER — METOPROLOL TARTRATE 5 MG/5ML IV SOLN
5.0000 mg | Freq: Three times a day (TID) | INTRAVENOUS | Status: DC
  Administered 2018-11-22 – 2018-11-25 (×7): 5 mg via INTRAVENOUS
  Filled 2018-11-22 (×7): qty 5

## 2018-11-22 MED ORDER — FUROSEMIDE 20 MG PO TABS: 20.0000 mg | ORAL_TABLET | Freq: Two times a day (BID) | ORAL | Status: DC

## 2018-11-22 MED ORDER — LEVOTHYROXINE SODIUM 100 MCG/5ML IV SOLN
60.0000 ug | Freq: Every day | INTRAVENOUS | Status: DC
  Administered 2018-11-22 – 2018-11-27 (×6): 60 ug via INTRAVENOUS
  Filled 2018-11-22 (×6): qty 5

## 2018-11-22 MED ORDER — VALPROATE SODIUM 500 MG/5ML IV SOLN
250.0000 mg | Freq: Two times a day (BID) | INTRAVENOUS | Status: DC
  Administered 2018-11-22 – 2018-11-25 (×6): 250 mg via INTRAVENOUS
  Filled 2018-11-22 (×7): qty 2.5

## 2018-11-22 MED ORDER — ASPIRIN 300 MG RE SUPP
300.0000 mg | Freq: Every day | RECTAL | Status: DC
  Administered 2018-11-22 – 2018-11-25 (×2): 300 mg via RECTAL
  Filled 2018-11-22 (×4): qty 1

## 2018-11-22 MED ORDER — PANTOPRAZOLE SODIUM 40 MG IV SOLR
40.0000 mg | INTRAVENOUS | Status: DC
  Administered 2018-11-22 – 2018-11-25 (×4): 40 mg via INTRAVENOUS
  Filled 2018-11-22 (×4): qty 40

## 2018-11-22 NOTE — Progress Notes (Signed)
EEG Completed; Results Pending  

## 2018-11-22 NOTE — Progress Notes (Addendum)
Neurology Progress Note   CC: Dysarthria and jerking movements  History is obtained from: Daughter  HPI: John Peck is a 85 y.o. male with PMHx of HLD, CAD s/p PPM, CABG, Afib not on [facility], CVA who suddenly experienced slurred speech on 1/13.  Code stroke was called.  Pt was deemed not a candidate for IV tPA as LKW >4.5 hrs.  Pt was head CT which showed no acute intracranial pathology.  Per family, pt has hx of urethral stricture and multiple UTIs.  Pt does see a urologist. Pt's symptoms have somewhat improved this AM.   Past Medical History:  Diagnosis Date  . Anemia   . Arthritis   . CAD (coronary artery disease)    a. CABG '89; b. PCI '04; c. 11/2015 Cath/PCI: LM 20ost, LAD 176m, LCX 30p, OM2 40, RCA 30p, 10p ISR, 90d (3.0x12 Synergy DES), AM 90, VG->OM2 known to be 100, LIMA->LAD not injected, patent in 2015.  . Carotid stenosis, right   . Chronic combined systolic and diastolic CHF (congestive heart failure) ([place])   . CKD (chronic kidney disease), stage III ([place])   . Dyspnea   . H/O: GI bleed    REMOTE HISTORY  . History of COPD   . Hyperlipidemia   . Hypertensive heart disease   . LBBB (left bundle branch block)   . Pacemaker Sept 2009   St Jude  . PAF (paroxysmal atrial fibrillation) (HCC)    a. not on anticoag due to prior GIB.  . Sick sinus syndrome [facility]) Sept 2009   ST Jude PTVDP  . Stroke [facility])     Family History  Problem Relation Age of Onset  . Diabetes Father   . Stroke Sister   . Heart disease Sister   . Heart disease Brother   . Heart attack Brother   . Stroke Brother   . Hypertension Neg Hx      Social History:   reports that he quit smoking about 21 years ago. His smoking use included cigarettes. He has a 48.75 pack-year smoking history. He quit smokeless tobacco use about 10 years ago. He reports that he does not drink alcohol or use drugs. Medications  Current Facility-Administered Medications:  .  acetaminophen (TYLENOL) tablet 650 mg, 650  mg, Oral, Q4H PRN **OR** acetaminophen (TYLENOL) solution 650 mg, 650 mg, Per Tube, Q4H PRN **OR** acetaminophen (TYLENOL) suppository 650 mg, 650 mg, Rectal, Q4H PRN, Eugenie Filler, MD .  acetaminophen (TYLENOL) tablet 650 mg, 650 mg, Oral, TID, Johnson-Pitts, Endia, MD, 650 mg at 11/21/18 1228 .  albuterol (PROVENTIL) (2.5 MG/3ML) 0.083% nebulizer solution 2.5 mg, 2.5 mg, Inhalation, Q4H PRN, Johnson-Pitts, Endia, MD, 2.5 mg at 11/20/18 1601 .  aspirin suppository 300 mg, 300 mg, Rectal, Daily, Eugenie Filler, MD .  atorvastatin (LIPITOR) tablet 40 mg, 40 mg, Oral, QHS, Johnson-Pitts, Endia, MD, 40 mg at 11/20/18 2300 .  bisacodyl (DULCOLAX) suppository 10 mg, 10 mg, Rectal, Daily, Eugenie Filler, MD .  ceFEPIme (MAXIPIME) 2 g in sodium chloride 0.9 % 100 mL IVPB, 2 g, Intravenous, Q24H, Eugenie Filler, MD, Stopped at 11/21/18 1823 .  clopidogrel (PLAVIX) tablet 75 mg, 75 mg, Oral, Q breakfast, Johnson-Pitts, Endia, MD, 75 mg at 11/21/18 0907 .  cyanocobalamin ((VITAMIN B-12)) injection 1,000 mcg, 1,000 mcg, Intramuscular, Daily, Eugenie Filler, MD, 1,000 mcg at 11/21/18 1230 .  diclofenac sodium (VOLTAREN) 1 % transdermal gel 2 g, 2 g, Topical, QID PRN, Eugenie Filler, MD, 2  g at 11/20/18 1845 .  ezetimibe (ZETIA) tablet 10 mg, 10 mg, Oral, Daily, Johnson-Pitts, Endia, MD, 10 mg at 11/21/18 1228 .  feeding supplement (ENSURE ENLIVE) (ENSURE ENLIVE) liquid 237 mL, 237 mL, Oral, BID BM, Johnson-Pitts, Endia, MD, 237 mL at 11/21/18 1651 .  ferrous sulfate tablet 325 mg, 325 mg, Oral, BID WC, Johnson-Pitts, Endia, MD, 325 mg at 11/21/18 0907 .  furosemide (LASIX) tablet 20 mg, 20 mg, Oral, BID, Eugenie Filler, MD .  heparin injection 5,000 Units, 5,000 Units, Subcutaneous, Q8H, Eugenie Filler, MD, 5,000 Units at 11/22/18 0533 .  hydrocortisone cream 1 %, , Topical, BID, Johnson-Pitts, Endia, MD, 1 application at 83/41/96 2129 .  hydrOXYzine (ATARAX/VISTARIL) tablet 25  mg, 25 mg, Oral, TID PRN, Eugenie Filler, MD, 25 mg at 11/20/18 1616 .  ipratropium-albuterol (DUONEB) 0.5-2.5 (3) MG/3ML nebulizer solution 3 mL, 3 mL, Nebulization, BID, Johnson-Pitts, Endia, MD, 3 mL at 11/22/18 0825 .  isosorbide mononitrate (IMDUR) 24 hr tablet 90 mg, 90 mg, Oral, Daily, Johnson-Pitts, Endia, MD, 90 mg at 11/21/18 1229 .  latanoprost (XALATAN) 0.005 % ophthalmic solution 1 drop, 1 drop, Both Eyes, Once per day on Mon Wed, Johnson-Pitts, Endia, MD, 1 drop at 11/21/18 2129 .  levothyroxine (SYNTHROID, LEVOTHROID) injection 60 mcg, 60 mcg, Intravenous, Daily, Eugenie Filler, MD .  metoprolol tartrate (LOPRESSOR) injection 5 mg, 5 mg, Intravenous, Q8H, Eugenie Filler, MD .  mirtazapine (REMERON) tablet 7.5 mg, 7.5 mg, Oral, QHS, Eugenie Filler, MD, 7.5 mg at 11/20/18 2300 .  nitroGLYCERIN (NITROSTAT) SL tablet 0.4 mg, 0.4 mg, Sublingual, Q5 min PRN, Johnson-Pitts, Endia, MD .  ondansetron (ZOFRAN) tablet 4 mg, 4 mg, Oral, Q6H PRN, 4 mg at 11/17/18 1716 **OR** ondansetron (ZOFRAN) injection 4 mg, 4 mg, Intravenous, Q6H PRN, Johnson-Pitts, Endia, MD .  pantoprazole (PROTONIX) injection 40 mg, 40 mg, Intravenous, Q24H, Eugenie Filler, MD .  phenylephrine ((USE for PREPARATION-H)) 0.25 % suppository 1 suppository, 1 suppository, Rectal, PRN, Johnson-Pitts, Endia, MD .  polyethylene glycol (MIRALAX / GLYCOLAX) packet 17 g, 17 g, Oral, Daily, Eugenie Filler, MD, 17 g at 11/20/18 0953 .  senna-docusate (Senokot-S) tablet 1 tablet, 1 tablet, Oral, QHS, Eugenie Filler, MD, 1 tablet at 11/20/18 2300 .  senna-docusate (Senokot-S) tablet 1 tablet, 1 tablet, Oral, QHS PRN, Eugenie Filler, MD .  tamsulosin [facility]) capsule 0.4 mg, 0.4 mg, Oral, Daily, Johnson-Pitts, Endia, MD, 0.4 mg at 11/21/18 1229   Exam: Current vital signs: BP (!) 159/66 (BP Location: Right Arm)   Pulse 61   Temp 98.3 F (36.8 C) (Axillary)   Resp 14   Ht 5\' 9"  (1.753 m)   Wt 75.1 kg    SpO2 98%   BMI 24.45 kg/m  Vital signs in last 24 hours: Temp:  [97.5 F (36.4 C)-99.4 F (37.4 C)] 98.3 F (36.8 C) (01/14 0500) Pulse Rate:  [60-82] 61 (01/14 0500) Resp:  [12-22] 14 (01/14 0534) BP: (134-177)/(64-71) 159/66 (01/14 0500) SpO2:  [93 %-100 %] 98 % (01/14 0825) Weight:  [75.1 kg] 75.1 kg (01/14 0534)  Physical Exam  Constitutional: Appears well-developed and well-nourished.  Psych: Affect appropriate to situation Eyes: No scleral injection HENT: No OP obstrucion Head: Normocephalic.  Cardiovascular: Normal rate and regular rhythm.  Respiratory: Effort normal, non-labored breathing GI: Soft.  No distension. There is no tenderness.  Skin: WDI  Neuro: Lying in bed with eyes closed, does not open eyes to voice, opens to noxious stim  PERRL, speech fluent, does not follow commands Held b/l UE antigravity Wiggles toes b/l  Labs I have reviewed labs in epic and the results pertinent to this consultation are:   CBC    Component Value Date/Time   WBC 4.9 11/22/2018 0822   RBC 2.40 (L) 11/22/2018 0822   HGB 7.3 (L) 11/22/2018 0822   HGB 10.3 (L) 12/10/2017 1203   HGB 11.0 (L) 12/25/2016 1030   HCT 24.0 (L) 11/22/2018 0822   HCT 31.8 (L) 12/10/2017 1203   HCT 32.2 (L) 12/25/2016 1030   PLT 126 (L) 11/22/2018 0822   PLT 129 (L) 12/10/2017 1203   MCV 100.0 11/22/2018 0822   MCV 99 (H) 12/10/2017 1203   MCV 96.0 12/25/2016 1030   MCH 30.4 11/22/2018 0822   MCHC 30.4 11/22/2018 0822   RDW 17.0 (H) 11/22/2018 0822   RDW 15.4 12/10/2017 1203   RDW 13.9 12/25/2016 1030   LYMPHSABS 0.4 (L) 11/15/2018 1603   LYMPHSABS 1.0 12/25/2016 1030   MONOABS 0.6 11/15/2018 1603   MONOABS 0.6 12/25/2016 1030   EOSABS 0.2 11/15/2018 1603   EOSABS 0.3 12/25/2016 1030   BASOSABS 0.0 11/15/2018 1603   BASOSABS 0.0 12/25/2016 1030    CMP     Component Value Date/Time   NA 145 11/22/2018 0822   NA 140 12/10/2017 1203   NA 145 01/19/2014 0814   K 4.2 11/22/2018 0822    K 3.8 01/19/2014 0814   CL 109 11/22/2018 0822   CL 109 (H) 01/20/2013 0842   CO2 26 11/22/2018 0822   CO2 26 01/19/2014 0814   GLUCOSE 74 11/22/2018 0822   GLUCOSE 98 01/19/2014 0814   GLUCOSE 114 (H) 01/20/2013 0842   BUN 57 (H) 11/22/2018 0822   BUN 23 12/10/2017 1203   BUN 17.1 01/19/2014 0814   CREATININE 2.55 (H) 11/22/2018 0822   CREATININE 1.23 (H) 01/14/2017 0831   CREATININE 1.2 01/19/2014 0814   CALCIUM 8.5 (L) 11/22/2018 0822   CALCIUM 9.4 01/19/2014 0814   PROT 7.9 11/15/2018 1603   PROT 6.8 07/28/2017 0813   PROT 7.6 01/19/2014 0814   ALBUMIN 2.5 (L) 11/22/2018 0822   ALBUMIN 4.0 07/28/2017 0813   ALBUMIN 3.4 (L) 01/19/2014 0814   AST 11 (L) 11/15/2018 1603   AST 11 01/19/2014 0814   ALT 7 11/15/2018 1603   ALT 8 01/19/2014 0814   ALKPHOS 55 11/15/2018 1603   ALKPHOS 92 01/19/2014 0814   BILITOT 0.8 11/15/2018 1603   BILITOT 0.6 07/28/2017 0813   BILITOT 0.51 01/19/2014 0814   GFRNONAA 21 (L) 11/22/2018 0822   GFRNONAA 54 (L) 01/23/2014 1126   GFRAA 24 (L) 11/22/2018 0822   GFRAA 62 01/23/2014 1126    Lipid Panel     Component Value Date/Time   CHOL 91 11/22/2018 0822   CHOL 128 07/28/2017 0813   TRIG 78 11/22/2018 0822   HDL 26 (L) 11/22/2018 0822   HDL 59 07/28/2017 0813   CHOLHDL 3.5 11/22/2018 0822   VLDL 16 11/22/2018 0822   LDLCALC 49 11/22/2018 0822   LDLCALC 54 07/28/2017 0813   LDLDIRECT 55 12/10/2011 0806     Imaging I have reviewed the images obtained:  CT-scan of the brain    A/P: 85 yo M with PMHx of HLD, CAD s/p PPM, CABG, Afib not on [facility], CVA who suddenly experienced slurred speech and myoclonic jerks on 1/13 with no acute pathology on head CT.  Pt's encephalopathy and delirium are most likely toxic/metabolic  in etiology.  Check Routine EEG Repeat head CT Cont Abx for UTI Cont care as per primary team Will cont to follow  D/w family at bedside  Ray Church, MD Attending Neurologist  Total time spent 29min

## 2018-11-22 NOTE — Procedures (Signed)
History: 85 year old male being evaluated for slurred speech  Sedation: None  Technique: This is a 21 channel routine scalp EEG performed at the bedside with bipolar and monopolar montages arranged in accordance to the international 10/20 system of electrode placement. One channel was dedicated to EKG recording.    Background: The background is somewhat disorganized with irregular delta and theta activities dominating. There is a posterior dominant rhythm of 7 - 8Hz  which is poorly sustained. There are prominent runs of frontally predominant rhythmic delta activity(FIRDA). There are occasional sharp wave and sharp-and-low wave discharges seen in the right temporal region.   Photic stimulation: Physiologic driving is not performed  EEG Abnormalities: 1) Right temporal sharp waves 2) Diffuse irregular slow activity 3) Slow PDR  Clinical Interpretation: This EEG is consistent with an area of potential epileptogenicity in the right temporal region in the setting of a generalized non-specific cerebral dysfunction(encephalopathy).   There was no seizure recorded on this study. Please note that lack of epileptiform activity on EEG does not preclude the possibility of epilepsy.   Roland Rack, MD [facility] [phone]  If 7pm- 7am, please page neurology on call as listed in [place].

## 2018-11-22 NOTE — Evaluation (Signed)
Speech Language Pathology Evaluation Patient Details Name: John Peck MRN: [id number] DOB: Aug 31, 1924 Today's Date: 11/22/2018 Time: 0920-0930 SLP Time Calculation (min) (ACUTE ONLY): 10 min  Problem List:  Patient Active Problem List   Diagnosis Date Noted  . Dysarthria 11/21/2018  . Acute on chronic systolic CHF (congestive heart failure) ([place]) 11/21/2018  . SOB (shortness of breath)   . Acidosis 11/17/2018  . Renal failure (ARF), acute on chronic (HCC) 11/15/2018  . Constipation 11/15/2018  . Pyuria 11/15/2018  . Dementia ([place]) 11/15/2018  . Dysphagia   . AKI (acute kidney injury) ([place])   . Altered mental status 08/09/2018  . Delirium 08/09/2018  . UTI (urinary tract infection) 08/09/2018  . Right flank pain   . Goals of care, counseling/discussion   . Palliative care encounter   . Chest pain 07/29/2018  . TIA (transient ischemic attack) 06/29/2018  . Pressure injury of skin 05/25/2018  . Pleural effusion 05/24/2018  . Acute respiratory failure with hypoxia ([place]) 05/24/2018  . NSTEMI (non-ST elevated myocardial infarction) ([place])   . Chronic systolic CHF (congestive heart failure) ([place]) 03/14/2018  . COPD clinical dx/ no pfts on record 03/14/2018  . H/O: GI bleed 03/14/2018  . CKD (chronic kidney disease), stage III ([place]) 03/14/2018  . Macrocytic anemia 03/14/2018  . Paroxysmal atrial fibrillation ([place]) 03/14/2018  . Hypothyroidism 03/14/2018  . Carotid stenosis, right 03/14/2018  . Facial droop 02/22/2018  . Pancytopenia ([place]) 02/22/2018  . Generalized weakness   . Hypertensive heart disease   . Hyperlipidemia   . Stented coronary artery   . Coronary artery disease involving native coronary artery of native heart with unstable angina pectoris ([place])   . Reactive airway disease 11/27/2015  . CAD in native artery 09/03/2015  . Weakness 05/09/2015  . Hyperlipidemia LDL goal <70 02/28/2015  . Atherosclerosis of lower extremity with claudication ([place]) 07/17/2014   . PAF (paroxysmal atrial fibrillation) ([place]) 03/24/2013  . Lower extremity edema 03/24/2013  . Sinoatrial node dysfunction ([place]) 08/17/2012  . History of iron deficiency anemia 09/18/2011  . Essential hypertension 07/07/2009  . INTERMEDIATE CORONARY SYNDROME 07/07/2009  . S/P CABG x 2 '89. RCA stent'04. last cath Jan 2013 07/07/2009  . PACEMAKER, PERMANENT- St Jude Sept 2009 07/07/2009  . Sick sinus syndrome ([place]) 07/10/2008  . Pacemaker 07/10/2008   Past Medical History:  Past Medical History:  Diagnosis Date  . Anemia   . Arthritis   . CAD (coronary artery disease)    a. CABG '89; b. PCI '04; c. 11/2015 Cath/PCI: LM 20ost, LAD 124m, LCX 30p, OM2 40, RCA 30p, 10p ISR, 90d (3.0x12 Synergy DES), AM 90, VG->OM2 known to be 100, LIMA->LAD not injected, patent in 2015.  . Carotid stenosis, right   . Chronic combined systolic and diastolic CHF (congestive heart failure) ([place])   . CKD (chronic kidney disease), stage III ([place])   . Dyspnea   . H/O: GI bleed    REMOTE HISTORY  . History of COPD   . Hyperlipidemia   . Hypertensive heart disease   . LBBB (left bundle branch block)   . Pacemaker Sept 2009   St Jude  . PAF (paroxysmal atrial fibrillation) (HCC)    a. not on anticoag due to prior GIB.  . Sick sinus syndrome [facility]) Sept 2009   ST Jude PTVDP  . Stroke [facility])    Past Surgical History:  Past Surgical History:  Procedure Laterality Date  . CARDIAC CATHETERIZATION  11/2011   EF 50%; significant native  CAD w/80% stenosis of the LAD after 2nd giagonal vessel and septal perforating artery w/total occlusion of the mid left anterior descendiung.; 60-70% ostiasl stenosis in the circumflex vessel followed by 40% proximal stenosis and 70-80% distal circumflex stenosis;   . CARDIAC CATHETERIZATION N/A 11/29/2015   Procedure: Left Heart Cath and Cors/Grafts Angiography;  Surgeon: Burnell Blanks, MD;  Location: [place] CV LAB;  Service: Cardiovascular;  Laterality: N/A;  .  CARDIAC CATHETERIZATION N/A 11/29/2015   Procedure: Coronary Stent Intervention;  Surgeon: Burnell Blanks, MD;  Location: [place] CV LAB;  Service: Cardiovascular;  Laterality: N/A;  . CATARACT EXTRACTION  2004  . CORONARY ANGIOGRAPHY N/A 02/25/2017   Procedure: Coronary Angiography;  Surgeon: Belva Crome, MD;  Location: [place] CV LAB;  Service: Cardiovascular;  Laterality: N/A;  . CORONARY ANGIOPLASTY WITH STENT PLACEMENT  2004   RCA  . CORONARY ARTERY BYPASS GRAFT  1989   had LIMA to his LAD, a vein to the circumflex. In 2004 underwent stenting to his right coronary artery.  . CORONARY STENT INTERVENTION N/A 02/24/2017   Procedure: Coronary Stent Intervention;  Surgeon: Wellington Hampshire, MD;  Location: [place] CV LAB;  Service: Cardiovascular;  Laterality: N/A;  cfx  . HEMORRHOID SURGERY    . INSERT / REPLACE / REMOVE PACEMAKER  07/17/08   DUAL-CHAMBER; PPM-ST.JUDE MEDNET  . IR THORACENTESIS ASP PLEURAL SPACE W/IMG GUIDE  05/25/2018  . IR THORACENTESIS ASP PLEURAL SPACE W/IMG GUIDE  08/01/2018  . LEFT HEART CATH AND CORS/GRAFTS ANGIOGRAPHY N/A 02/24/2017   Procedure: Left Heart Cath and Cors/Grafts Angiography;  Surgeon: Wellington Hampshire, MD;  Location: [place] CV LAB;  Service: Cardiovascular;  Laterality: N/A;  . LEFT HEART CATHETERIZATION WITH CORONARY/GRAFT ANGIOGRAM N/A 12/10/2011   Procedure: LEFT HEART CATHETERIZATION WITH Beatrix Fetters;  Surgeon: Troy Sine, MD;  Location: [facility] CATH LAB;  Service: Cardiovascular;  Laterality: N/A;  . LEFT HEART CATHETERIZATION WITH CORONARY/GRAFT ANGIOGRAM N/A 01/26/2014   Procedure: LEFT HEART CATHETERIZATION WITH Beatrix Fetters;  Surgeon: Troy Sine, MD;  Location: [facility] CATH LAB;  Service: Cardiovascular;  Laterality: N/A;  . PPM GENERATOR CHANGEOUT N/A 12/15/2017   Procedure: PPM GENERATOR CHANGEOUT;  Surgeon: Evans Lance, MD;  Location: [place] CV LAB;  Service: Cardiovascular;  Laterality: N/A;  .  PROSTATECTOMY     HPI:  85 year old male admitted 11/15/2018 with right inguinal hernia complaint. Patient noted on 11/21/2018 to have dysarthric speech and code stroke has been activated PMH: COPD, CVA, prior smoker, SSS, chronic kidney disease, CHF, chronic anemia.  CT 11/21/2018 = no acute hemorrhage   Assessment / Plan / Recommendation Clinical Impression  Pt was unable to participate in formal cognitive-linguistic assessment due to significant language of confusion. Pt's speech is fully intelligible, with all verbal responses being "Oh no", "I don't know", "Leave me alone", or "I don't want anything". Pt was unable to follow verbal commands or answer orientation questions. He could not tell me his name or current location.  MD indicates pt will be transferred to [facility] for neuro consult. ST will follow up there.     SLP Assessment  SLP Recommendation/Assessment: Patient needs continued Speech Language Pathology Services SLP Visit Diagnosis: Cognitive communication deficit (R41.841)    Follow Up Recommendations  (TBD)    Frequency and Duration min 2x/week  2 weeks      SLP Evaluation Cognition  Overall Cognitive Status: Impaired/Different from baseline Orientation Level: Disoriented X4  Comprehension  Auditory Comprehension Overall Auditory Comprehension: Impaired Yes/No Questions: Impaired Basic Biographical Questions: 0-25% accurate Commands: Impaired One Step Basic Commands: 0-24% accurate Interfering Components: Attention;Working memory;Processing speed    Expression Expression Primary Mode of Expression: Verbal Verbal Expression Overall Verbal Expression: Impaired Initiation: No impairment Automatic Speech: (Pt stating "Oh no", "Leave me alone" and "I don't want anything" repeatedly) Repetition: Impaired Level of Impairment: Word level Naming: Impairment Responsive: (Pt responds "I don't know" to all questions) Verbal Errors: Language of confusion Written  Expression Dominant Hand: Right Written Expression: Not tested   Oral / Motor  Oral Motor/Sensory Function Overall Oral Motor/Sensory Function: (speech intelligible. No obvious oral motor weakness) Motor Speech Overall Motor Speech: Appears within functional limits for tasks assessed Intelligibility: Intelligible   GO                   Elward Nocera B. Quentin Ore [facility], CCC-SLP Speech Language Pathologist [phone]  Shonna Chock 11/22/2018, 10:21 AM

## 2018-11-22 NOTE — Progress Notes (Signed)
PROGRESS NOTE    John Peck  YWV:[id number] DOB: 1923/12/23 DOA: 11/15/2018 PCP: Seward Carol, MD    Brief Narrative:  Per Dr. Wynetta Emery- John Peck is a 85 y.o. male with medical history significant of chronic kidney disease, CHF, chronic anemia presented with right-sided inguinal hernia complaints.  It is been hurting him over the past 2 weeks.  Has been treated supportively at the nursing facility with Tylenol and ibuprofen.  Was no inguinal hernia on my or ER clinicians physical exam.  It was also not seen on abdominal pelvic CT.  Patient denied any chest pain shortness of breath, or  fever.  He does follow a fluid restricted diet at the facility.  He also eats a mechanical soft diet, and  was not eating very well so family has been giving him additional food to entice him to eat as well.  Patient was unsure if he has had a urinary changes.  Had a bowel movements morning but it was small. ED Course: Patient CT scan of abdomen and pelvis that did not show any inguinal hernia.  Also did not show any stones or hydronephrosis.  Patient does have a chronic right-sided pleural effusion that was present again today.  He was found to have acute on chronic renal failure with a creatinine of 4.4 with prior being 1.8.  He also had some white cells in his urine and has had previous UTI in the past.  Patient noted on 11/21/2018 to have dysarthric speech and code stroke has been activated.   Assessment & Plan:   Principal Problem:   Dysarthria Active Problems:   Renal failure (ARF), acute on chronic (HCC)   PAF (paroxysmal atrial fibrillation) (HCC)   CAD in native artery   Chronic systolic CHF (congestive heart failure) (HCC)   Hypothyroidism   Pleural effusion   Constipation   Pyuria   Dementia (HCC)   Acidosis   Acute on chronic systolic CHF (congestive heart failure) (HCC)   SOB (shortness of breath)  #1 dysarthric speech/myoclonic jerks??  Code stroke Patient noted to  have dysarthric speech over the past 30 minutes between 130p to 2 PM on 11/21/2018.  Patient noted to have incomprehensible speech over the past 24 to 36 hours.  Patient also noted to have some myoclonic jerks.  Code stroke was initiated stat head CT negative for any acute abnormalities.  Patient unable to get a MRI due to pacemaker.  Patient was seen by telemetry neurology CT angiogram head and neck was recommended however due to concerns for worsening renal function family declined CT angiogram head and neck.  EEG pending.  Stroke work-up underway including 2D echo and carotid Dopplers.  Spoke with neurology earlier on this morning who had recommended transfer to [facility].  Neurology however came by and assessed patient at [facility] and feels patient's symptoms likely secondary to metabolic encephalopathy and recommending ongoing treatment of urinary tract infection.  We will continue IV cefepime for another 3 days to complete a 10-day course of treatment.  Neurontin has been discontinued.  PT/OT/ST.    2 acute on chronic renal failure stage III/acidosis Questionable etiology.  Likely secondary to prerenal azotemia in the setting of diuretics, NSAID use.  Level 99.  Urine creatinine of 39.10.  UA trace leukocytes nitrite -11-20 WBCs.  Urine cultures positive for pseudomonas aeruginosa.  CT abdomen and pelvis negative for obstruction or hydronephrosis.  IV fluids have been saline locked.  Creatinine at 2.55  from 2.59 from 2.68 from 2.96 from 3.18 from 3.71 from 4.03 from 4.46 on admission.  Last creatinine was 1.82 on 09/01/2018.  Patient with a urine output of 1860 cc over the past 24 hours after 2 doses of IV Lasix.  Discontinued IV Rocephin and started IV cefepime as patient with a Pseudomonas UTI.  Discontinued bicarb drip as acidosis seem to have resolved.  Patient with improvement with shortness of breath and patient started on home dose oral Lasix.  Ranexa discontinued.  Follow.    3.  Paroxysmal atrial fibrillation Continue Coreg for rate control.  Continue aspirin and Plavix.  Follow.   4.  Acute on chronic systolic heart failure/CAD Patient with complaints of shortness of breath on minimal exertion on 11/20/2018.  Physical exam with decreased breath sounds in the bases and decreased bibasilar crackles which are improving.  Patient was hydrated with IV fluids due to worsening renal function on admission.  IV fluids have been saline locked as of 11/19/2018.  Patient's diuretics have been held since admission.  Placed on Lasix 20 mg IV every 12 hours x2 on 11/20/2018 with urine output of 1. 860 L over the past 24 hours.  Clinically improved.  Chest x-ray was consistent with volume overload.  Patient started on home dose oral Lasix. this morning.  Continue Coreg, aspirin, Plavix, Imdur, Lipitor.  5.  Hypothyroidism Continue home dose Synthroid.  6.  Constipation Likely etiology of patient's abdominal pain and concern for inguinal hernia.  CT abdomen and pelvis negative for hernia.  Patient with large bowel movements per RN.  Patient refusing Dulcolax suppositories and oral MiraLAX.  Patient with some complaints of abdominal discomfort on 11/19/2018 which is since improving.  Continue Dulcolax suppositories daily, MiraLAX, Senokot-S.  Will need a bowel regimen on discharge.   7.  Chronic pleural effusion Stable.  Monitor closely with volume overload.  Saline lock IV fluids.  Home dose oral Lasix resumed.  8.  Dementia Stable.  9.  Pseudomonas aeruginosa UTI Urine cultures consistent with pseudomonas aeruginosa.  Sensitivities to ceftazidime, Cipro, gent, imipenem, Zosyn, cefepime.  Continue IV cefepime.  10 chronic anemia Hemoglobin currently at 7.3 from 7.5 from 7.4 from 7.8 from 7.5 from 8.0 from 8.5.  Likely dilutional.  Anemia panel consistent with anemia of chronic disease.  Patient denies any overt bleeding. Transfusion threshold hemoglobin less than 7.  Saline lock  IV fluids.  11.  Probable neuropathy of feet Patient started on Neurontin 100 mg daily.  Discontinued Neurontin on 11/21/2018 due to patient's dysarthric speech and myoclonic jerks.  Tylenol as needed.  12.  Shortness of breath Likely secondary to hydration since admission due to worsening renal function.  IV fluids have been saline locked.  Chest x-ray was compatible with volume overload.  Patient given Lasix 20 mg IV every 12 hours x2 doses with good urine output.  Patient with a urine output of 1.860 L over the past 24 hours.  Improved symptomatically.  Patient's home dose oral Lasix resumed.  Patient noted to have dysarthric speech on 11/21/2018 and placed on IV fluids.  Saline lock IV fluids.   DVT prophylaxis: scd Code Status: DNR Family Communication: updated patient and daughters at bedside and via telephone. Disposition Plan: Back to SNF once work-up completed when okay with neurology.   Consultants:   Telemetry neurology 11/21/2018  Procedures:   CT abd/pelvis 11/15/2018.  Antimicrobials:  IV Rocephin 11/15/2018>>>> 11/17/2018  IV cefepime 11/17/2018>>>>>> 08/27/2019   Subjective: Patient sleeping however easily  arousable.  Still dysarthric with incomprehensible speech.  Asking to be left alone.   Objective: Vitals:   11/22/18 0030 11/22/18 0500 11/22/18 0534 11/22/18 0825  BP: (!) 153/65 (!) 159/66    Pulse: 61 61    Resp: 18 12 14    Temp: 98.4 F (36.9 C) 98.3 F (36.8 C)    TempSrc: Oral Axillary    SpO2: 98% 100%  98%  Weight:   75.1 kg   Height:        Intake/Output Summary (Last 24 hours) at 11/22/2018 0840 Last data filed at 11/22/2018 0600 Gross per 24 hour  Intake 1326.38 ml  Output 1860 ml  Net -533.62 ml   Filed Weights   11/20/18 1100 11/21/18 0514 11/22/18 0534  Weight: 71.2 kg 77.1 kg 75.1 kg    Examination:  General exam: Dysarthric speech. Respiratory system: Decreasing bibasilar crackles.  No wheezing, no rhonchi. Normal respiratory effort.   Cardiovascular system: Regular rate rhythm with 3/6 systolic ejection murmur.  No JVD.  No lower extremity edema.  Gastrointestinal system: Abdomen is soft, nondistended, nontender, positive bowel sounds.  No rebound.  No guarding.  Central nervous system: Patient with dysarthric speech.  Moving extremities spontaneously.  Some myoclonic jerks.  Incomprehensible speech.   Extremities: Symmetric 5 x 5 power. Skin: No rashes, lesions or ulcers Psychiatry: Judgement and insight appear normal. Mood & affect appropriate.     Data Reviewed: I have personally reviewed following labs and imaging studies  CBC: Recent Labs  Lab 11/15/18 1603 11/16/18 0559 11/17/18 0619 11/17/18 1447 11/18/18 0655 11/20/18 0647 11/21/18 0645  WBC 4.3 3.6* 3.9*  --  4.0  --  5.2  NEUTROABS 3.0  --   --   --   --   --   --   HGB 8.5* 8.0* 7.5* 7.8* 7.8* 7.4* 7.5*  HCT 27.9* 25.9* 24.3* 25.4* 25.1* 23.7* 24.0*  MCV 99.6 98.5 100.0  --  97.7  --  97.6  PLT 149* 133* 129*  --  149*  --  979*   Basic Metabolic Panel: Recent Labs  Lab 11/17/18 0619 11/18/18 0655 11/19/18 0702 11/20/18 0647 11/21/18 0645  NA 136 139 141 142 142  K 5.0 5.1 4.6 4.4 4.6  CL 111 109 103 106 104  CO2 15* 20* 30 27 29   GLUCOSE 95 87 97 120* 96  BUN 78* 67* 63* 59* 62*  CREATININE 3.71* 3.18* 2.96* 2.68* 2.59*  CALCIUM 8.5* 8.4* 8.1* 8.2* 8.5*  MG  --   --   --   --  2.0  PHOS 4.2 3.4 3.1 2.9 3.2   GFR: Estimated Creatinine Clearance: 18.2 mL/min (A) (by C-G formula based on SCr of 2.59 mg/dL (H)). Liver Function Tests: Recent Labs  Lab 11/15/18 1603 11/17/18 0619 11/18/18 0655 11/19/18 0702 11/20/18 0647 11/21/18 0645  AST 11*  --   --   --   --   --   ALT 7  --   --   --   --   --   ALKPHOS 55  --   --   --   --   --   BILITOT 0.8  --   --   --   --   --   PROT 7.9  --   --   --   --   --   ALBUMIN 3.0* 2.9* 2.6* 2.4* 2.3* 2.5*   No results for input(s): LIPASE, AMYLASE in the last 168 hours. No results  for input(s): AMMONIA in the last 168 hours. Coagulation Profile: No results for input(s): INR, PROTIME in the last 168 hours. Cardiac Enzymes: No results for input(s): CKTOTAL, CKMB, CKMBINDEX, TROPONINI in the last 168 hours. BNP (last 3 results) No results for input(s): PROBNP in the last 8760 hours. HbA1C: No results for input(s): HGBA1C in the last 72 hours. CBG: Recent Labs  Lab 11/21/18 1436  GLUCAP 105*   Lipid Profile: No results for input(s): CHOL, HDL, LDLCALC, TRIG, CHOLHDL, LDLDIRECT in the last 72 hours. Thyroid Function Tests: Recent Labs    11/21/18 1032  TSH 1.859  FREET4 0.99   Anemia Panel: No results for input(s): VITAMINB12, FOLATE, FERRITIN, TIBC, IRON, RETICCTPCT in the last 72 hours. Sepsis Labs: No results for input(s): PROCALCITON, LATICACIDVEN in the last 168 hours.  Recent Results (from the past 240 hour(s))  Urine culture     Status: Abnormal   Collection Time: 11/15/18  6:26 PM  Result Value Ref Range Status   Specimen Description   Final    URINE, RANDOM Performed at [facility] [address], [place], [place] [address]    Special Requests   Final    NONE Performed at [facility], [place] [address], [place], [place] [address]    Culture >=100,000 COLONIES/mL PSEUDOMONAS AERUGINOSA (A)  Final   Report Status 11/18/2018 FINAL  Final   Organism ID, Bacteria PSEUDOMONAS AERUGINOSA (A)  Final      Susceptibility   Pseudomonas aeruginosa - MIC*    CEFTAZIDIME 4 SENSITIVE Sensitive     CIPROFLOXACIN <=0.25 SENSITIVE Sensitive     GENTAMICIN <=1 SENSITIVE Sensitive     IMIPENEM 2 SENSITIVE Sensitive     PIP/TAZO <=4 SENSITIVE Sensitive     CEFEPIME 4 SENSITIVE Sensitive     * >=100,000 COLONIES/mL PSEUDOMONAS AERUGINOSA  MRSA PCR Screening     Status: None   Collection Time: 11/15/18 11:15 PM  Result Value Ref Range Status   MRSA by PCR NEGATIVE NEGATIVE Final    Comment:        The GeneXpert MRSA  Assay (FDA approved for NASAL specimens only), is one component of a comprehensive MRSA colonization surveillance program. It is not intended to diagnose MRSA infection nor to guide or monitor treatment for MRSA infections. Performed at [facility], [place] [address], [place], [place] [address]          Radiology Studies: Dg Chest 2 View  Result Date: 11/21/2018 CLINICAL DATA:  Hypertension EXAM: CHEST - 2 VIEW COMPARISON:  11/20/2018 FINDINGS: Cardiac shadow is prominent but stable. Pacing device and postsurgical changes are again seen. Tortuosity of the thoracic aorta is noted. Stable bilateral pleural effusions are noted right greater than left. The degree of vascular congestion is stable from the previous exam. No new focal abnormality is noted. IMPRESSION: Stable changes when compared with the prior study. Electronically Signed   By: Inez Catalina M.D.   On: 11/21/2018 15:43   Dg Chest Port 1 View  Result Date: 11/20/2018 CLINICAL DATA:  Acute shortness of breath and generalized weakness. EXAM: PORTABLE CHEST 1 VIEW COMPARISON:  08/30/2018 and earlier. FINDINGS: Sternotomy for CABG. Cardiac silhouette moderately enlarged, unchanged. LEFT subclavian dual lead transvenous pacemaker unchanged and appears intact. Mild diffuse interstitial pulmonary edema superimposed upon baseline changes of emphysema. Chronic or recurrent BILATERAL pleural effusions, RIGHT greater than LEFT, with associated passive atelectasis in the RIGHT LOWER LOBE. IMPRESSION: Mild acute CHF, with stable moderate cardiomegaly  and mild diffuse interstitial pulmonary edema. Chronic or recurrent BILATERAL pleural effusions, RIGHT greater than LEFT. Electronically Signed   By: Evangeline Dakin M.D.   On: 11/20/2018 20:45   Ct Head Code Stroke Wo Contrast  Addendum Date: 11/21/2018   ADDENDUM REPORT: 11/21/2018 15:04 ADDENDUM: These results were called by telephone at the time of interpretation on  11/21/2018 at 2:46 PM to Dr. Irine Seal , who verbally acknowledged these results. Electronically Signed   By: Ulyses Jarred M.D.   On: 11/21/2018 15:04   Result Date: 11/21/2018 CLINICAL DATA:  Code stroke.  Slurred speech EXAM: CT HEAD WITHOUT CONTRAST TECHNIQUE: Contiguous axial images were obtained from the base of the skull through the vertex without intravenous contrast. COMPARISON:  Head CT 08/09/2018 FINDINGS: Brain: There is no mass, hemorrhage or extra-axial collection. There is generalized atrophy without lobar predilection. Areas of hypoattenuation of the deep gray nuclei and confluent periventricular white matter hypodensity, consistent with chronic small vessel disease. Vascular: Atherosclerotic calcification of the internal carotid arteries at the skull base. No abnormal hyperdensity of the major intracranial arteries or dural venous sinuses. Skull: The visualized skull base, calvarium and extracranial soft tissues are normal. Sinuses/Orbits: No fluid levels or advanced mucosal thickening of the visualized paranasal sinuses. No mastoid or middle ear effusion. The orbits are normal. ASPECTS [facility] Stroke Program Early CT Score) - Ganglionic level infarction (caudate, lentiform nuclei, internal capsule, insula, M1-M3 cortex): 7 - Supraganglionic infarction (M4-M6 cortex): 3 Total score (0-10 with 10 being normal): 10 IMPRESSION: 1. No acute hemorrhage. 2. Chronic ischemic microangiopathy and generalized atrophy. 3. ASPECTS is 10. Electronically Signed: By: Ulyses Jarred M.D. On: 11/21/2018 14:37        Scheduled Meds: . acetaminophen  650 mg Oral TID  . aspirin  81 mg Oral Daily  . atorvastatin  40 mg Oral QHS  . bisacodyl  10 mg Rectal Daily  . carvedilol  12.5 mg Oral BID WC  . clopidogrel  75 mg Oral Q breakfast  . cyanocobalamin  1,000 mcg Intramuscular Daily  . ezetimibe  10 mg Oral Daily  . feeding supplement (ENSURE ENLIVE)  237 mL Oral BID BM  . ferrous sulfate  325 mg  Oral BID WC  . furosemide  20 mg Oral BID  . heparin  5,000 Units Subcutaneous Q8H  . hydrocortisone cream   Topical BID  . ipratropium-albuterol  3 mL Nebulization BID  . isosorbide mononitrate  90 mg Oral Daily  . latanoprost  1 drop Both Eyes Once per day on Mon Wed  . levothyroxine  125 mcg Oral QAC breakfast  . mirtazapine  7.5 mg Oral QHS  . pantoprazole  40 mg Oral Daily  . polyethylene glycol  17 g Oral Daily  . senna-docusate  1 tablet Oral QHS  . tamsulosin  0.4 mg Oral Daily   Continuous Infusions: . ceFEPime (MAXIPIME) IV Stopped (11/21/18 1823)     LOS: 7 days    Time spent: 40 minutes    Irine Seal, MD [facility] Pager [phone]  If 7PM-7AM, please contact night-coverage [email or web address] Password [email or web address] 11/22/2018, 8:40 AM

## 2018-11-22 NOTE — Progress Notes (Signed)
Notified K. Schorr, NP of patients NIH score of 17 from score of 10 yesterday. Patient not following commands, responding with "leave me alone" of "no", no eye opening. Patient vital signs stable. Primary RN to notify for further needs.

## 2018-11-22 NOTE — Progress Notes (Signed)
OT Cancellation Note  Patient Details Name: John Peck MRN: [id number] DOB: 27-Dec-1923   Cancelled Treatment:    Reason Eval/Treat Not Completed: Other (comment) Pt finally resting after being agitated.  Family reports at SNF he feeding himself and did walk.  Will check back on pt next day  [name], [place] [phone] Office- [phone], Thereasa Parkin 11/22/2018, 1:56 PM

## 2018-11-22 NOTE — Progress Notes (Signed)
I attempted to complete a NIH scale at [facility]. Patient asleep and would not open eyes. Patient kept responding with a grunted "nah" when asked to perform a movement or follow a command. Unable to report an honest NIH assessment at this time. Will return to attempt NIH scale assessment.

## 2018-11-22 NOTE — Evaluation (Signed)
Clinical/Bedside Swallow Evaluation Patient Details  Name: John Peck MRN: [id number] Date of Birth: 1924-05-18  Today's Date: 11/22/2018 Time: SLP Start Time (ACUTE ONLY): 0930 SLP Stop Time (ACUTE ONLY): 1000 SLP Time Calculation (min) (ACUTE ONLY): 30 min  Past Medical History:  Past Medical History:  Diagnosis Date  . Anemia   . Arthritis   . CAD (coronary artery disease)    a. CABG '89; b. PCI '04; c. 11/2015 Cath/PCI: LM 20ost, LAD 127m, LCX 30p, OM2 40, RCA 30p, 10p ISR, 90d (3.0x12 Synergy DES), AM 90, VG->OM2 known to be 100, LIMA->LAD not injected, patent in 2015.  . Carotid stenosis, right   . Chronic combined systolic and diastolic CHF (congestive heart failure) ([place])   . CKD (chronic kidney disease), stage III ([place])   . Dyspnea   . H/O: GI bleed    REMOTE HISTORY  . History of COPD   . Hyperlipidemia   . Hypertensive heart disease   . LBBB (left bundle branch block)   . Pacemaker Sept 2009   St Jude  . PAF (paroxysmal atrial fibrillation) (HCC)    a. not on anticoag due to prior GIB.  . Sick sinus syndrome [facility]) Sept 2009   ST Jude PTVDP  . Stroke [facility])    Past Surgical History:  Past Surgical History:  Procedure Laterality Date  . CARDIAC CATHETERIZATION  11/2011   EF 50%; significant native CAD w/80% stenosis of the LAD after 2nd giagonal vessel and septal perforating artery w/total occlusion of the mid left anterior descendiung.; 60-70% ostiasl stenosis in the circumflex vessel followed by 40% proximal stenosis and 70-80% distal circumflex stenosis;   . CARDIAC CATHETERIZATION N/A 11/29/2015   Procedure: Left Heart Cath and Cors/Grafts Angiography;  Surgeon: Burnell Blanks, MD;  Location: [place] CV LAB;  Service: Cardiovascular;  Laterality: N/A;  . CARDIAC CATHETERIZATION N/A 11/29/2015   Procedure: Coronary Stent Intervention;  Surgeon: Burnell Blanks, MD;  Location: [place] CV LAB;  Service: Cardiovascular;  Laterality: N/A;  .  CATARACT EXTRACTION  2004  . CORONARY ANGIOGRAPHY N/A 02/25/2017   Procedure: Coronary Angiography;  Surgeon: Belva Crome, MD;  Location: [place] CV LAB;  Service: Cardiovascular;  Laterality: N/A;  . CORONARY ANGIOPLASTY WITH STENT PLACEMENT  2004   RCA  . CORONARY ARTERY BYPASS GRAFT  1989   had LIMA to his LAD, a vein to the circumflex. In 2004 underwent stenting to his right coronary artery.  . CORONARY STENT INTERVENTION N/A 02/24/2017   Procedure: Coronary Stent Intervention;  Surgeon: Wellington Hampshire, MD;  Location: [place] CV LAB;  Service: Cardiovascular;  Laterality: N/A;  cfx  . HEMORRHOID SURGERY    . INSERT / REPLACE / REMOVE PACEMAKER  07/17/08   DUAL-CHAMBER; PPM-ST.JUDE MEDNET  . IR THORACENTESIS ASP PLEURAL SPACE W/IMG GUIDE  05/25/2018  . IR THORACENTESIS ASP PLEURAL SPACE W/IMG GUIDE  08/01/2018  . LEFT HEART CATH AND CORS/GRAFTS ANGIOGRAPHY N/A 02/24/2017   Procedure: Left Heart Cath and Cors/Grafts Angiography;  Surgeon: Wellington Hampshire, MD;  Location: [place] CV LAB;  Service: Cardiovascular;  Laterality: N/A;  . LEFT HEART CATHETERIZATION WITH CORONARY/GRAFT ANGIOGRAM N/A 12/10/2011   Procedure: LEFT HEART CATHETERIZATION WITH Beatrix Fetters;  Surgeon: Troy Sine, MD;  Location: [facility] CATH LAB;  Service: Cardiovascular;  Laterality: N/A;  . LEFT HEART CATHETERIZATION WITH CORONARY/GRAFT ANGIOGRAM N/A 01/26/2014   Procedure: LEFT HEART CATHETERIZATION WITH Beatrix Fetters;  Surgeon: Troy Sine, MD;  Location:  [place] CATH LAB;  Service: Cardiovascular;  Laterality: N/A;  . PPM GENERATOR CHANGEOUT N/A 12/15/2017   Procedure: PPM GENERATOR CHANGEOUT;  Surgeon: Evans Lance, MD;  Location: [place] CV LAB;  Service: Cardiovascular;  Laterality: N/A;  . PROSTATECTOMY     HPI:  85 year old male admitted 11/15/2018 with right inguinal hernia complaint. Patient noted on 11/21/2018 to have dysarthric speech and code stroke has been activated PMH:  COPD, CVA, prior smoker, SSS, chronic kidney disease, CHF, chronic anemia.  CT 11/21/2018 = no acute hemorrhage   Assessment / Plan / Recommendation Clinical Impression  Decline in pt status 11/21/2018 necessitating Head CT (no MRI due to pacemaker) and speech re-consult.   Upon arrival of SLP, pt is awake, calling out "leave me alone" and "I don't want anything". Oral care was completed with suction. Food particles were removed from oral cavity. Pt was given a single ice chip, thin liquid via teaspoon, and 1/2 tsp puree. No oral maniuplation was noted, with oral holding evident. One reflexive swallow was elicited without overt s/s aspiration. However, the remainder of po trials were suctioned from oral cavity. Moisturizer was applied to pt's lips, and he was left at 45 degrees. MD reports plan to transfer pt to [facility] for Neuro consult. ST will follow up there.  Recommend NPO including meds at this time.   SLP Visit Diagnosis: Dysphagia, unspecified (R13.10)    Aspiration Risk  Moderate aspiration risk;Severe aspiration risk    Diet Recommendation NPO   Medication Administration: Via alternative means    Other  Recommendations Oral Care Recommendations: Oral care QID Other Recommendations: Have oral suction available;Remove water pitcher   Follow up Recommendations (TBD)      Frequency and Duration min 2x/week  1 week;2 weeks       Prognosis Prognosis for Safe Diet Advancement: Guarded Barriers to Reach Goals: Cognitive deficits      Swallow Study   General Date of Onset: 11/15/18 HPI: 85 year old male admitted 11/15/2018 with right inguinal hernia complaint. Patient noted on 11/21/2018 to have dysarthric speech and code stroke has been activated PMH: COPD, CVA, prior smoker, SSS, chronic kidney disease, CHF, chronic anemia.  CT 11/21/2018 = no acute hemorrhage Type of Study: Bedside Swallow Evaluation Previous Swallow Assessment: MBS Nov 2019 = min oral dysphagia, trace pen thin. Hx  esophageal dysmotility. Reg/thin. BSE 11/16/2018 = Reg/thin Diet Prior to this Study: NPO Temperature Spikes Noted: No Respiratory Status: Nasal cannula History of Recent Intubation: No Behavior/Cognition: Alert;Confused;Uncooperative;Doesn't follow directions;Agitated Oral Cavity Assessment: Within Functional Limits Oral Care Completed by SLP: Yes(food particles removed from oral cavity with suction.) Oral Cavity - Dentition: Adequate natural dentition Vision: Impaired for self-feeding Self-Feeding Abilities: Total assist Patient Positioning: Upright in bed Baseline Vocal Quality: Normal Volitional Cough: Cognitively unable to elicit Volitional Swallow: Unable to elicit    Oral/Motor/Sensory Function Overall Oral Motor/Sensory Function: (Speech intelligible. No obvious oral motor weakness)   Ice Chips Ice chips: Impaired Oral Phase Impairments: Poor awareness of bolus;Impaired mastication;Reduced lingual movement/coordination;Reduced labial seal Oral Phase Functional Implications: Oral holding Pharyngeal Phase Impairments: Suspected delayed Swallow   Thin Liquid Thin Liquid: Impaired Oral Phase Impairments: Poor awareness of bolus;Reduced lingual movement/coordination;Reduced labial seal Oral Phase Functional Implications: Oral holding Pharyngeal  Phase Impairments: Suspected delayed Swallow    Nectar Thick Nectar Thick Liquid: Not tested   Honey Thick Honey Thick Liquid: Not tested   Puree Puree: Impaired Presentation: Spoon Oral Phase Impairments: Poor awareness of bolus;Reduced labial  seal;Reduced lingual movement/coordination Oral Phase Functional Implications: Oral holding Pharyngeal Phase Impairments: Suspected delayed Swallow   Solid     Solid: Not tested     Celia B. Quentin Ore, [facility], [place] Speech Language Pathologist [phone]  Shonna Chock 11/22/2018,10:11 AM

## 2018-11-22 NOTE — Progress Notes (Signed)
  Echocardiogram 2D Echocardiogram has been performed.  John Peck 11/22/2018, 9:41 AM

## 2018-11-22 NOTE — Progress Notes (Signed)
Carotid artery duplex has been completed. 80-99% ICA stenosis on the right. 40-59% ICA stenosis on the left. The results are unchanged from the previous carotid duplex performed on 02/22/18. Results were given to the patient's nurse, Andee Poles.  11/22/18 2:54 PM John Peck RVT

## 2018-11-22 NOTE — Progress Notes (Signed)
  Speech Language Pathology Treatment: Dysphagia  Patient Details Name: John Peck MRN: [id number] DOB: November 13, 1923 Today's Date: 11/22/2018 Time: 0539-7673 SLP Time Calculation (min) (ACUTE ONLY): 15 min  Assessment / Plan / Recommendation Clinical Impression  MD reports pt was talking with neurologist, and requested re-assessment by SLP. Multiple family members were present at this time. Pt continued to call out "Oh no", "get that out of my mouth", and "I don't want anything", but was willing to accept 2 ice chips and 2 small bites of applesauce with significant encouragement and bargaining. Thin liquids were refused via straw, and cup sips resulted in anterior leakage of the majority of the bolus. Swallow reflex was elicited, with no overt s/s aspiration. Given continued confusion, inability to follow commands, and aspiration risk, NPO status continues to be recommended. Meds can be given crushed in puree, however, pt may or may not accept them. Pt is not transferring to main campus per MD. SLP will follow acutely and proceed as pt tolerates.   HPI HPI: 85 year old male admitted 11/15/2018 with right inguinal hernia complaint. Patient noted on 11/21/2018 to have dysarthric speech and code stroke has been activated PMH: COPD, CVA, prior smoker, SSS, chronic kidney disease, CHF, chronic anemia.  CT 11/21/2018 = no acute hemorrhage      SLP Plan  Continue with current plan of care  Patient needs continued Speech Language Pathology Services    Recommendations  Diet recommendations: NPO Medication Administration: Crushed with puree                Oral Care Recommendations: Oral care QID Follow up Recommendations: (TBD) SLP Visit Diagnosis: Dysphagia, unspecified (R13.10) Plan: Continue with current plan of care       GO               Tiran Sauseda B. Quentin Ore [facility], CCC-SLP Speech Language Pathologist [phone]  Shonna Chock 11/22/2018, 12:06 PM

## 2018-11-22 NOTE — Progress Notes (Signed)
Notified Provider K. Schorr of Rapid Response RN's inability to perform NIH assment on pt due to pt not wanting to follow commands. Pt only grunting "nah" when asked to do something. VSS. Pt does not appear to be in any distress at this time.

## 2018-11-23 LAB — CBC WITH DIFFERENTIAL/PLATELET
Abs Immature Granulocytes: 0.01 10*3/uL (ref 0.00–0.07)
Basophils Absolute: 0 10*3/uL (ref 0.0–0.1)
Basophils Relative: 0 %
Eosinophils Absolute: 0.2 10*3/uL (ref 0.0–0.5)
Eosinophils Relative: 3 %
HEMATOCRIT: 24 % — AB (ref 39.0–52.0)
Hemoglobin: 7.2 g/dL — ABNORMAL LOW (ref 13.0–17.0)
Immature Granulocytes: 0 %
LYMPHS ABS: 0.6 10*3/uL — AB (ref 0.7–4.0)
Lymphocytes Relative: 12 %
MCH: 30 pg (ref 26.0–34.0)
MCHC: 30 g/dL (ref 30.0–36.0)
MCV: 100 fL (ref 80.0–100.0)
MONO ABS: 0.6 10*3/uL (ref 0.1–1.0)
Monocytes Relative: 13 %
Neutro Abs: 3.4 10*3/uL (ref 1.7–7.7)
Neutrophils Relative %: 72 %
Platelets: 128 10*3/uL — ABNORMAL LOW (ref 150–400)
RBC: 2.4 MIL/uL — ABNORMAL LOW (ref 4.22–5.81)
RDW: 16.7 % — ABNORMAL HIGH (ref 11.5–15.5)
WBC: 4.8 10*3/uL (ref 4.0–10.5)
nRBC: 0 % (ref 0.0–0.2)

## 2018-11-23 LAB — RENAL FUNCTION PANEL
Albumin: 2.3 g/dL — ABNORMAL LOW (ref 3.5–5.0)
Anion gap: 16 — ABNORMAL HIGH (ref 5–15)
BUN: 56 mg/dL — AB (ref 8–23)
CO2: 23 mmol/L (ref 22–32)
CREATININE: 2.4 mg/dL — AB (ref 0.61–1.24)
Calcium: 8.5 mg/dL — ABNORMAL LOW (ref 8.9–10.3)
Chloride: 107 mmol/L (ref 98–111)
GFR calc Af Amer: 26 mL/min — ABNORMAL LOW (ref >60)
GFR calc non Af Amer: 23 mL/min — ABNORMAL LOW (ref >60)
Glucose, Bld: 73 mg/dL (ref 70–99)
Phosphorus: 3.8 mg/dL (ref 2.5–4.6)
Potassium: 4.3 mmol/L (ref 3.5–5.1)
Sodium: 146 mmol/L — ABNORMAL HIGH (ref 135–145)

## 2018-11-23 MED ORDER — RAMELTEON 8 MG PO TABS
8.0000 mg | ORAL_TABLET | Freq: Every day | ORAL | Status: DC
  Administered 2018-11-26 – 2018-11-28 (×3): 8 mg via ORAL
  Filled 2018-11-23 (×6): qty 1

## 2018-11-23 MED ORDER — LIP MEDEX EX OINT
TOPICAL_OINTMENT | CUTANEOUS | Status: AC
  Administered 2018-11-23: 1
  Filled 2018-11-23: qty 7

## 2018-11-23 NOTE — Progress Notes (Signed)
Occupational Therapy Evaluation Patient Details Name: John Peck MRN: [id number] DOB: [birth year] Today's Date: 11/23/2018    History of Present Illness 85 y.o. male admitted to ED on 11/15/18 from [facility] for ARF, groin pain.  PMH includes CAD with multiple cardiac caths as well as CABG, HTN, CKD stage III, hypothyroidism, dementia, pleural effusion,  anemia, chronic heart failure, stroke, COPD, dyspnea, PAD/PVD, and HLD.   Clinical Impression   Pt admitted with the above. Pt currently with functional limitations due to the deficits listed below (see OT Problem List).  Pt will benefit from skilled OT to increase their safety and independence with ADL and functional mobility for ADL to facilitate discharge to venue listed below.   Limited OT session. Pt was a bit agitated as well as MD came in to speak with pt and family.     Follow Up Recommendations  SNF          Precautions / Restrictions Precautions Precautions: Fall      Mobility Bed Mobility Overal bed mobility: Needs Assistance             General bed mobility comments: total A to reposition in bed.  Transfers              NT            ADL either performed or assessed with clinical judgement   ADL Overall ADL's : Needs assistance/impaired                                       General ADL Comments: refused any ADL activity. Family reports pt normally able to feed himself     Vision Patient Visual Report: No change from baseline              Pertinent Vitals/Pain Pain Assessment: Faces Pain Score: 0-No pain     Hand Dominance     Extremity/Trunk Assessment Upper Extremity Assessment Upper Extremity Assessment: Generalized weakness              Cognition     Overall Cognitive Status: Impaired/Different from baseline                         Following Commands: Follows one step commands consistently(from grandaughter)       General Comments:  only followed commands from grandaughter   General Comments               Home Living Family/patient expects to be discharged to:: Skilled nursing facility     Type of Home: [place]                                   OT Frequency: Min 2X/week              AM-PAC OT "6 Clicks" Daily Activity     Outcome Measure Help from another person eating meals?: A Lot Help from another person taking care of personal grooming?: A Lot Help from another person toileting, which includes using toliet, bedpan, or urinal?: Total Help from another person bathing (including washing, rinsing, drying)?: Total Help from another person to put on and taking off regular upper body clothing?: Total Help from another person to put on and taking off regular lower body clothing?: Total 6 Click Score:  8   End of Session Nurse Communication: Mobility status  Activity Tolerance: Treatment limited secondary to agitation Patient left: in bed;with call bell/phone within reach;with family/visitor present  OT Visit Diagnosis: History of falling (Z91.81);Repeated falls (R29.6);Muscle weakness (generalized) (M62.81)                Time: 1050-1110 OT Time Calculation (min): 20 min Charges:  OT General Charges $OT Visit: 1 Visit OT Evaluation $OT Eval Moderate Complexity: 1 Mod  John Peck, [place] [phone] Office- [phone], John Peck 11/23/2018, 2:33 PM

## 2018-11-23 NOTE — Progress Notes (Signed)
PROGRESS NOTE    John Peck  OEV:[id number] DOB: 02-17-1924 DOA: 11/15/2018 PCP: Seward Carol, MD   Brief Narrative:  Per Dr. Wynetta Emery- John Peck 85 y.o.malewith medical history significant ofchronic kidney disease, CHF, chronic anemia presented with right-sided inguinal hernia complaints. It is been hurting him over the past 2 weeks. Has been treated supportively at the nursing facility with Tylenol and ibuprofen. Was no inguinal hernia on my or ER clinicians physical exam. It was also not seen on abdominal pelvic CT. Patient denied any chest pain shortness of breath, orfever. He does follow Verline Kong fluid restricted diet at the facility. He also eats Philana Younis mechanical soft diet, andwas not eating very well so family has been giving him additional food to entice him to eat as well. Patient was unsure if he has had Alyana Kreiter urinary changes. Had Jayla Mackie bowel movements morning but it was small. ED Course:Patient CT scan of abdomen and pelvis that did not show any inguinal hernia. Also did not show any stones or hydronephrosis. Patient does have Trigg Delarocha chronic right-sided pleural effusion that was present again today. He was found to have acute on chronic renal failure with Jujuan Dugo creatinine of 4.4 with prior being 1.8. He also had some white cells in his urine and has had previous UTI in the past.  Patient noted on 11/21/2018 to have dysarthric speech and code stroke has been activated.   Assessment & Plan:   Principal Problem:   Dysarthria Active Problems:   PAF (paroxysmal atrial fibrillation) (HCC)   CAD in native artery   Chronic systolic CHF (congestive heart failure) (HCC)   Hypothyroidism   Pleural effusion   Renal failure (ARF), acute on chronic (HCC)   Constipation   Pyuria   Dementia (HCC)   Acidosis   Acute on chronic systolic CHF (congestive heart failure) (HCC)   SOB (shortness of breath)   #1 dysarthric speech  myoclonic jerks  Toxic Metabolic  Encephalopathy Patient noted to have dysarthric speech between 130p to 2 PM on 11/21/2018.  Patient noted to have incomprehensible speech over the previous 24-36 hours.  Patient also noted to have some myoclonic jerks.   Code stroke was initiated stat head CT negative for any acute abnormalities.   Patient unable to get Yaslyn Cumby MRI due to pacemaker.   Patient was seen by tele neurology - recommending EEG, CTA, but family did not want to pursue due to renal function Echo with EF 55-60% (see report) Carotid dopplers with 88-99% stenosis in R ICA, 40-59% stenosis in L carotid EEG with area of potential epileptogenicity in R temporal region - neuro recommending continue depakote CT head with old L basal ganglia lacunar infarct.  Low attenuation area within R internal capsule may represent small lacunar infarct, but reviewed by neurology and doesn't appear c/w new lacunar infarct Suspicion at this point is toxic/metabolic per neuro, recommending continue abx for UTI Appreciate neurology recs PT/OT/SLP  Dysphagia: speech recommending crushed with puree, staff to assist with feeding.  Recommending floor stock items with strict precautions and oral suction if does not elicit swallow.  Speech planning to follow.  # Pseudomonas UTI - continue cefepime  2 acute on chronic renal failure stage III/acidosis Suspected to be related to diuretics and NSAID use Questionable etiology. Creatinine 4.46 on presentation, baseline is ~1.8 Improving UA without protein, 0-5 RBC, CT without evidence of hydro Has been restarted on lasix Ranexa on hold Continue to monitor  3.  Paroxysmal atrial fibrillation Continue Coreg  for rate control.  Continue aspirin and Plavix.  Follow.  Will need to look into why not on anticoagulation if hx of afib?  4.  Acute on chronic diastolic heart failure  CAD Pt appeared volume overloaded on exam per previous provider and lasix restarted Continue home dose oral Lasix.  Continue  Coreg, aspirin, Plavix, Imdur, Lipitor.  5.  Hypothyroidism Continue home dose Synthroid.  6.  Constipation Likely etiology of patient's abdominal pain and concern for inguinal hernia.  CT abdomen and pelvis negative for hernia.  Patient with large bowel movements per RN.  Patient refusing Dulcolax suppositories and oral MiraLAX.  Patient with some complaints of abdominal discomfort on 11/19/2018 which is since improving.  Continue Dulcolax suppositories daily, MiraLAX, Senokot-S.  Will need Benancio Osmundson bowel regimen on discharge.   7.  Chronic pleural effusion Stable.  Monitor closely with volume overload.  Saline lock IV fluids.  Home dose oral Lasix resumed.  8.  Dementia Stable.  9.  Pseudomonas aeruginosa UTI Urine cultures consistent with pseudomonas aeruginosa.  Sensitivities to ceftazidime, Cipro, gent, imipenem, Zosyn, cefepime.  Continue IV cefepime.  10 chronic anemia Relatively stable, but low Labs suggestive of AOCD B12, folate normal Continue to monitor  11.  Probable neuropathy of feet Patient started on Neurontin 100 mg daily.  Discontinued Neurontin on 11/21/2018 due to patient's dysarthric speech and myoclonic jerks.  Tylenol as needed.  12.  Shortness of breath Likely secondary to hydration since admission due to worsening renal function.  IV fluids have been saline locked.  Chest x-ray was compatible with volume overload.  Patient given Lasix 20 mg IV every 12 hours x2 doses with good urine output.  Patient with Mitzi Lilja urine output of 1.860 L over the past 24 hours.  Improved symptomatically.  Patient's home dose oral Lasix resumed.  Patient noted to have dysarthric speech on 11/21/2018 and placed on IV fluids.  Saline lock IV fluids.  Mild hypernatremia: follow  Family requesting palliative care consult for assistance with goals of care  DVT prophylaxis: heparin Code Status: DNR Family Communication: daughters and granddaughter at bedside Disposition Plan: pending  further improvement   Consultants:   Neurology  Procedures:  US Carotid Summary: Right Carotid: Velocities in the right ICA are consistent with Ramonda Galyon 80-99%                stenosis.  Left Carotid: Velocities in the left ICA are consistent with Rian Busche 40-59% stenosis.  Vertebrals: Bilateral vertebral arteries demonstrate antegrade flow.  Echo Study Conclusions  - Left ventricle: Abnormal septal motion. The cavity size was   normal. Wall thickness was increased in Jule Whitsel pattern of mild LVH.   Systolic function was normal. The estimated ejection fraction was   in the range of 55% to 60%. The study is not technically   sufficient to allow evaluation of LV diastolic function. - Aortic valve: There was trivial regurgitation. - Mitral valve: There was mild regurgitation. - Left atrium: The atrium was mildly dilated. - Right atrium: The atrium was mildly dilated. - Atrial septum: No defect or patent foramen ovale was identified. - Pulmonary arteries: PA peak pressure: 50 mm Hg (S).  Antimicrobials:  Anti-infectives (From admission, onward)   Start     Dose/Rate Route Frequency Ordered Stop   11/18/18 1800  ceFEPIme (MAXIPIME) 2 g in sodium chloride 0.9 % 100 mL IVPB     2 g 200 mL/hr over 30 Minutes Intravenous Every 24 hours 11/18/18 1152 11/26/18 1759  11/17/18 1800  ceFEPIme (MAXIPIME) 1 g in sodium chloride 0.9 % 100 mL IVPB  Status:  Discontinued     1 g 200 mL/hr over 30 Minutes Intravenous Every 24 hours 11/17/18 1433 11/18/18 1152   11/17/18 0900  piperacillin-tazobactam (ZOSYN) IVPB 2.25 g  Status:  Discontinued     2.25 g 100 mL/hr over 30 Minutes Intravenous Every 8 hours 11/17/18 0817 11/17/18 1402   11/17/18 0815  piperacillin-tazobactam (ZOSYN) IVPB 3.375 g  Status:  Discontinued     3.375 g 12.5 mL/hr over 240 Minutes Intravenous Every 8 hours 11/17/18 0811 11/17/18 0817   11/15/18 2000  cefTRIAXone (ROCEPHIN) 1 g in sodium chloride 0.9 % 100 mL IVPB  Status:   Discontinued     1 g 200 mL/hr over 30 Minutes Intravenous Every 24 hours 11/15/18 1840 11/17/18 0811     Subjective: Confused. Denies pain. Sleepy. Daughters and granddaughter at bedside.  Objective: Vitals:   11/23/18 0500 11/23/18 0756 11/23/18 0855 11/23/18 1327  BP:  (!) 177/69  (!) 163/64  Pulse:  63  (!) 59  Resp:  16  16  Temp:  98.6 F (37 C)  99.2 F (37.3 C)  TempSrc:  Oral  Oral  SpO2:  100% 100% 100%  Weight: 73.5 kg     Height:        Intake/Output Summary (Last 24 hours) at 11/23/2018 1943 Last data filed at 11/23/2018 1534 Gross per 24 hour  Intake 205 ml  Output 700 ml  Net -495 ml   Filed Weights   11/21/18 0514 11/22/18 0534 11/23/18 0500  Weight: 77.1 kg 75.1 kg 73.5 kg    Examination:  General exam: sleepy Respiratory system: Clear to auscultation. Respiratory effort normal. Cardiovascular system: S1 & S2 heard, RRR.  Gastrointestinal system: Abdomen is nondistended, soft and nontender Central nervous system: sleepy, confused, says Krystle Oberman few words. No focal neurological deficits. Extremities: no lee Skin: No rashes, lesions or ulcers Psychiatry: Judgement and insight appear normal. Mood & affect appropriate.     Data Reviewed: I have personally reviewed following labs and imaging studies  CBC: Recent Labs  Lab 11/17/18 0619  11/18/18 0655 11/20/18 0647 11/21/18 0645 11/22/18 0822 11/23/18 0558  WBC 3.9*  --  4.0  --  5.2 4.9 4.8  NEUTROABS  --   --   --   --   --   --  3.4  HGB 7.5*   < > 7.8* 7.4* 7.5* 7.3* 7.2*  HCT 24.3*   < > 25.1* 23.7* 24.0* 24.0* 24.0*  MCV 100.0  --  97.7  --  97.6 100.0 100.0  PLT 129*  --  149*  --  122* 126* 128*   < > = values in this interval not displayed.   Basic Metabolic Panel: Recent Labs  Lab 11/19/18 0702 11/20/18 0647 11/21/18 0645 11/22/18 0822 11/23/18 0558  NA 141 142 142 145 146*  K 4.6 4.4 4.6 4.2 4.3  CL 103 106 104 109 107  CO2 30 27 29 26 23   GLUCOSE 97 120* 96 74 73  BUN  63* 59* 62* 57* 56*  CREATININE 2.96* 2.68* 2.59* 2.55* 2.40*  CALCIUM 8.1* 8.2* 8.5* 8.5* 8.5*  MG  --   --  2.0  --   --   PHOS 3.1 2.9 3.2 4.0 3.8   GFR: Estimated Creatinine Clearance: 19.6 mL/min (Analilia Geddis) (by C-G formula based on SCr of 2.4 mg/dL (H)). Liver Function Tests: Recent Labs  Lab  11/19/18 0702 11/20/18 9163 11/21/18 0645 11/22/18 0822 11/23/18 0558  ALBUMIN 2.4* 2.3* 2.5* 2.5* 2.3*   No results for input(s): LIPASE, AMYLASE in the last 168 hours. No results for input(s): AMMONIA in the last 168 hours. Coagulation Profile: No results for input(s): INR, PROTIME in the last 168 hours. Cardiac Enzymes: No results for input(s): CKTOTAL, CKMB, CKMBINDEX, TROPONINI in the last 168 hours. BNP (last 3 results) No results for input(s): PROBNP in the last 8760 hours. HbA1C: Recent Labs    11/22/18 0822  HGBA1C 5.9*   CBG: Recent Labs  Lab 11/21/18 1436  GLUCAP 105*   Lipid Profile: Recent Labs    11/22/18 0822  CHOL 91  HDL 26*  LDLCALC 49  TRIG 78  CHOLHDL 3.5   Thyroid Function Tests: Recent Labs    11/21/18 1032  TSH 1.859  FREET4 0.99   Anemia Panel: No results for input(s): VITAMINB12, FOLATE, FERRITIN, TIBC, IRON, RETICCTPCT in the last 72 hours. Sepsis Labs: No results for input(s): PROCALCITON, LATICACIDVEN in the last 168 hours.  Recent Results (from the past 240 hour(s))  Urine culture     Status: Abnormal   Collection Time: 11/15/18  6:26 PM  Result Value Ref Range Status   Specimen Description   Final    URINE, RANDOM Performed at [facility] [address], [place], [place] [address]    Special Requests   Final    NONE Performed at [facility], [place] [address], [place], [place] [address]    Culture >=100,000 COLONIES/mL PSEUDOMONAS AERUGINOSA (Sissy Goetzke)  Final   Report Status 11/18/2018 FINAL  Final   Organism ID, Bacteria PSEUDOMONAS AERUGINOSA (Zayna Toste)  Final      Susceptibility   Pseudomonas  aeruginosa - MIC*    CEFTAZIDIME 4 SENSITIVE Sensitive     CIPROFLOXACIN <=0.25 SENSITIVE Sensitive     GENTAMICIN <=1 SENSITIVE Sensitive     IMIPENEM 2 SENSITIVE Sensitive     PIP/TAZO <=4 SENSITIVE Sensitive     CEFEPIME 4 SENSITIVE Sensitive     * >=100,000 COLONIES/mL PSEUDOMONAS AERUGINOSA  MRSA PCR Screening     Status: None   Collection Time: 11/15/18 11:15 PM  Result Value Ref Range Status   MRSA by PCR NEGATIVE NEGATIVE Final    Comment:        The GeneXpert MRSA Assay (FDA approved for NASAL specimens only), is one component of [facility] comprehensive MRSA colonization surveillance program. It is not intended to diagnose MRSA infection nor to guide or monitor treatment for MRSA infections. Performed at [facility], [place] [address] [place],  [address]          Radiology Studies: Ct Head Wo Contrast  Result Date: 11/22/2018 CLINICAL DATA:  Difficulty with speech EXAM: CT HEAD WITHOUT CONTRAST TECHNIQUE: Contiguous axial images were obtained from the base of the skull through the vertex without intravenous contrast. COMPARISON:  CT brain scan of 11/21/2018 FINDINGS: Brain: The ventricular system remains dilated, and there are prominent cortical sulci, consistent with diffuse atrophy. The septum remains midline in position. The fourth ventricle and basilar cisterns are unremarkable. Moderate small vessel ischemic changes again noted throughout the periventricular white matter. There is more prominence of Aireana Ryland low-attenuation area within the internal capsule on the right consistent with lacunar infarction. No hemorrhage is seen. And old left lacunar infarct is noted in the basal ganglia. Vascular: No vascular abnormality is seen on this unenhanced study. Skull: On bone window images, no  calvarial abnormality is seen. Sinuses/Orbits: There is mild ethmoid sinus disease which appears unchanged compared to the prior CT. Other: None none. IMPRESSION: 1. Slightly  more prominent small low-attenuation area within the right internal capsule may represent Marquita Lias small lacunar infarct. No hemorrhage. 2. Diffuse changes of atrophy and moderate small vessel ischemic change. Small old left basal ganglial lacunar infarct is stable. Electronically Signed   By: Ivar Drape M.D.   On: 11/22/2018 15:41   Vas US Carotid (at [facility] Only)  Result Date: 11/22/2018 Carotid Arterial Duplex Study Indications:       CVA. Limitations:       Patient positioning, patient movement, respitory disturbance,                    vessel anatomy Comparison Study:  02/22/18 R 80-99% L 40-59% Performing Technologist: Oliver Hum RVT  Examination Guidelines: Donald Memoli complete evaluation includes B-mode imaging, spectral Doppler, color Doppler, and power Doppler as needed of all accessible portions of each vessel. Bilateral testing is considered an integral part of Andrews Tener complete examination. Limited examinations for reoccurring indications may be performed as noted.  Right Carotid Findings: +----------+--------+-------+--------+----------------------+------------------+           PSV cm/sEDV    StenosisDescribe              Comments                             cm/s                                                    +----------+--------+-------+--------+----------------------+------------------+ CCA Prox  47      9              smooth and            Abnormal waveforms                                  heterogenous                             +----------+--------+-------+--------+----------------------+------------------+ CCA Distal57      10             smooth and                                                                heterogenous                             +----------+--------+-------+--------+----------------------+------------------+ ICA Prox  281     114    80-99%  calcific                                  +----------+--------+-------+--------+----------------------+------------------+ ICA Distal113     38  tortuous           +----------+--------+-------+--------+----------------------+------------------+ ECA       341     43                                                      +----------+--------+-------+--------+----------------------+------------------+ +----------+--------+-------+--------+-------------------+           PSV cm/sEDV cmsDescribeArm Pressure (mmHG) +----------+--------+-------+--------+-------------------+ [id number]                                         +----------+--------+-------+--------+-------------------+ +---------+--------+--+--------+-+---------+ VertebralPSV cm/s48EDV cm/s8Antegrade +---------+--------+--+--------+-+---------+  Left Carotid Findings: +----------+--------+--------+--------+-----------------------+--------+           PSV cm/sEDV cm/sStenosisDescribe               Comments +----------+--------+--------+--------+-----------------------+--------+ CCA Prox  111     22              smooth and heterogenous         +----------+--------+--------+--------+-----------------------+--------+ CCA Distal102     30              smooth and heterogenous         +----------+--------+--------+--------+-----------------------+--------+ ICA Prox  143     42      40-59%  smooth and heterogenous         +----------+--------+--------+--------+-----------------------+--------+ ICA Distal88      34                                     tortuous +----------+--------+--------+--------+-----------------------+--------+ ECA       181                                                     +----------+--------+--------+--------+-----------------------+--------+ +----------+--------+--------+--------+-------------------+ SubclavianPSV cm/sEDV cm/sDescribeArm Pressure (mmHG)  +----------+--------+--------+--------+-------------------+           221                                         +----------+--------+--------+--------+-------------------+ +---------+--------+--+--------+--+---------+ VertebralPSV cm/s42EDV cm/s12Antegrade +---------+--------+--+--------+--+---------+  Summary: Right Carotid: Velocities in the right ICA are consistent with Yuktha Kerchner 80-99%                stenosis. Left Carotid: Velocities in the left ICA are consistent with Ripley Bogosian 40-59% stenosis. Vertebrals: Bilateral vertebral arteries demonstrate antegrade flow. *See table(s) above for measurements and observations.  Electronically signed by Antony Contras MD on 11/22/2018 at 6:11:57 PM.    Final         Scheduled Meds: . acetaminophen  650 mg Oral TID  . aspirin  300 mg Rectal Daily  . atorvastatin  40 mg Oral QHS  . bisacodyl  10 mg Rectal Daily  . clopidogrel  75 mg Oral Q breakfast  . cyanocobalamin  1,000 mcg Intramuscular Daily  . ezetimibe  10 mg Oral Daily  . feeding supplement (ENSURE ENLIVE)  237 mL Oral BID BM  . ferrous sulfate  325  mg Oral BID WC  . furosemide  20 mg Oral BID  . heparin  5,000 Units Subcutaneous Q8H  . hydrocortisone cream   Topical BID  . isosorbide mononitrate  90 mg Oral Daily  . latanoprost  1 drop Both Eyes Once per day on Mon Wed  . levothyroxine  60 mcg Intravenous Daily  . metoprolol tartrate  5 mg Intravenous Q8H  . mirtazapine  7.5 mg Oral QHS  . pantoprazole (PROTONIX) IV  40 mg Intravenous Q24H  . polyethylene glycol  17 g Oral Daily  . ramelteon  8 mg Oral QHS  . senna-docusate  1 tablet Oral QHS  . tamsulosin  0.4 mg Oral Daily   Continuous Infusions: . ceFEPime (MAXIPIME) IV 2 g (11/23/18 1913)  . valproate sodium 250 mg (11/23/18 1733)     LOS: 8 days    Time spent: over 80 min    Fayrene Helper, MD [facility] Pager see AMION  If 7PM-7AM, please contact night-coverage [email or web address] Password [facility] 11/23/2018, 7:43  PM

## 2018-11-23 NOTE — Progress Notes (Signed)
I have reviewed the repeat CT head from yesterday. The radiology report describes the right internal capsule lacunar infarction as possibly more prominent on yesterday's study relative to the day prior. On my review of the images, it does indeed appear more prominent on the axial slices, but it is unchanged on the coronal images - the difference in appearance between scans on the axials is therefore most likely due to slight differences in slice locations and it does not appear consistent with a new lacunar infarction.   EEG done yesterday revealed the following: 1) Right temporal sharp waves 2) Diffuse irregular slow activity 3) Slow PDR  A/R: Given the EEG abnormality which is consistent with an area of potential epileptogenicity in the right temporal region, his acute neurological change precipitating the Neurology consultation most likely was due to a first time seizure. Plan is to continue with valproic acid and obtain a level tomorrow AM.   Electronically signed: Dr. Kerney Elbe

## 2018-11-23 NOTE — Progress Notes (Addendum)
Neurology Progress Note   CC: Dysarthria and jerking movements  History is obtained from: Daughter  HPI: John Peck is a 85 y.o. male with PMHx of HLD, CAD s/p PPM, CABG, Afib not on [facility] [place], CVA who suddenly experienced slurred speech on 1/13.  Code stroke was called.  Pt was deemed not a candidate for IV tPA as LKW >4.5 hrs.  Pt was head CT which showed no acute intracranial pathology.  Per family, pt has hx of urethral stricture and multiple UTIs.  Pt does see a urologist. Pt's symptoms are improving. Family reports pt had previous diagnosis of seizure disorder but stopped taking medications due to cost.   Past Medical History:  Diagnosis Date  . Anemia   . Arthritis   . CAD (coronary artery disease)    a. CABG '89; b. PCI '04; c. 11/2015 Cath/PCI: LM 20ost, LAD 11m, LCX 30p, OM2 40, RCA 30p, 10p ISR, 90d (3.0x12 Synergy DES), AM 90, VG->OM2 known to be 100, LIMA->LAD not injected, patent in 2015.  . Carotid stenosis, right   . Chronic combined systolic and diastolic CHF (congestive heart failure) ([place])   . CKD (chronic kidney disease), stage III ([place])   . Dyspnea   . H/O: GI bleed    REMOTE HISTORY  . History of COPD   . Hyperlipidemia   . Hypertensive heart disease   . LBBB (left bundle branch block)   . Pacemaker Sept 2009   St Jude  . PAF (paroxysmal atrial fibrillation) (HCC)    a. not on anticoag due to prior GIB.  . Sick sinus syndrome [facility]) Sept 2009   ST Jude PTVDP  . Stroke [facility])     Family History  Problem Relation Age of Onset  . Diabetes Father   . Stroke Sister   . Heart disease Sister   . Heart disease Brother   . Heart attack Brother   . Stroke Brother   . Hypertension Neg Hx      Social History:   reports that he quit smoking about 21 years ago. His smoking use included cigarettes. He has a 48.75 pack-year smoking history. He quit smokeless tobacco use about 10 years ago. He reports that he does not drink alcohol or use  drugs. Medications  Current Facility-Administered Medications:  .  acetaminophen (TYLENOL) tablet 650 mg, 650 mg, Oral, Q4H PRN **OR** acetaminophen (TYLENOL) solution 650 mg, 650 mg, Per Tube, Q4H PRN **OR** acetaminophen (TYLENOL) suppository 650 mg, 650 mg, Rectal, Q4H PRN, Eugenie Filler, MD, 650 mg at 11/22/18 1651 .  acetaminophen (TYLENOL) tablet 650 mg, 650 mg, Oral, TID, Eugenie Filler, MD, 650 mg at 11/21/18 1228 .  albuterol (PROVENTIL) (2.5 MG/3ML) 0.083% nebulizer solution 2.5 mg, 2.5 mg, Inhalation, Q4H PRN, Eugenie Filler, MD, 2.5 mg at 11/20/18 1601 .  aspirin suppository 300 mg, 300 mg, Rectal, Daily, Eugenie Filler, MD, 300 mg at 11/22/18 1238 .  atorvastatin (LIPITOR) tablet 40 mg, 40 mg, Oral, QHS, Eugenie Filler, MD, 40 mg at 11/20/18 2300 .  bisacodyl (DULCOLAX) suppository 10 mg, 10 mg, Rectal, Daily, Eugenie Filler, MD .  ceFEPIme (MAXIPIME) 2 g in sodium chloride 0.9 % 100 mL IVPB, 2 g, Intravenous, Q24H, Eugenie Filler, MD, Stopped at 11/22/18 1756 .  clopidogrel (PLAVIX) tablet 75 mg, 75 mg, Oral, Q breakfast, Eugenie Filler, MD, 75 mg at 11/21/18 7782 .  cyanocobalamin ((VITAMIN B-12)) injection 1,000 mcg, 1,000 mcg, Intramuscular, Daily, Eugenie Filler,  MD, 1,000 mcg at 11/21/18 1230 .  diclofenac sodium (VOLTAREN) 1 % transdermal gel 2 g, 2 g, Topical, QID PRN, Eugenie Filler, MD, 2 g at 11/20/18 1845 .  ezetimibe (ZETIA) tablet 10 mg, 10 mg, Oral, Daily, Eugenie Filler, MD, 10 mg at 11/21/18 1228 .  feeding supplement (ENSURE ENLIVE) (ENSURE ENLIVE) liquid 237 mL, 237 mL, Oral, BID BM, Eugenie Filler, MD, 237 mL at 11/21/18 1651 .  ferrous sulfate tablet 325 mg, 325 mg, Oral, BID WC, Eugenie Filler, MD, 325 mg at 11/21/18 9798 .  furosemide (LASIX) tablet 20 mg, 20 mg, Oral, BID, Eugenie Filler, MD .  heparin injection 5,000 Units, 5,000 Units, Subcutaneous, Q8H, Eugenie Filler, MD, 5,000 Units at 11/23/18  0554 .  hydrocortisone cream 1 %, , Topical, BID, Eugenie Filler, MD, 1 application at 92/11/94 2111 .  hydrOXYzine (ATARAX/VISTARIL) tablet 25 mg, 25 mg, Oral, TID PRN, Eugenie Filler, MD, 25 mg at 11/20/18 1616 .  ipratropium-albuterol (DUONEB) 0.5-2.5 (3) MG/3ML nebulizer solution 3 mL, 3 mL, Nebulization, BID, Eugenie Filler, MD, 3 mL at 11/23/18 0853 .  isosorbide mononitrate (IMDUR) 24 hr tablet 90 mg, 90 mg, Oral, Daily, Eugenie Filler, MD, 90 mg at 11/21/18 1229 .  latanoprost (XALATAN) 0.005 % ophthalmic solution 1 drop, 1 drop, Both Eyes, Once per day on Mon Wed, Thompson, Daniel V, MD, 1 drop at 11/21/18 2129 .  levothyroxine (SYNTHROID, LEVOTHROID) injection 60 mcg, 60 mcg, Intravenous, Daily, Eugenie Filler, MD, 60 mcg at 11/23/18 0945 .  lip balm (CARMEX) ointment, , , ,  .  metoprolol tartrate (LOPRESSOR) injection 5 mg, 5 mg, Intravenous, Q8H, Eugenie Filler, MD, 5 mg at 11/23/18 0554 .  mirtazapine (REMERON) tablet 7.5 mg, 7.5 mg, Oral, QHS, Eugenie Filler, MD, 7.5 mg at 11/20/18 2300 .  nitroGLYCERIN (NITROSTAT) SL tablet 0.4 mg, 0.4 mg, Sublingual, Q5 min PRN, Eugenie Filler, MD .  ondansetron [facility]) tablet 4 mg, 4 mg, Oral, Q6H PRN, 4 mg at 11/17/18 1716 **OR** ondansetron (ZOFRAN) injection 4 mg, 4 mg, Intravenous, Q6H PRN, Eugenie Filler, MD .  pantoprazole (PROTONIX) injection 40 mg, 40 mg, Intravenous, Q24H, Eugenie Filler, MD, 40 mg at 11/23/18 0942 .  phenylephrine ((USE for PREPARATION-H)) 0.25 % suppository 1 suppository, 1 suppository, Rectal, PRN, Eugenie Filler, MD .  polyethylene glycol (MIRALAX / GLYCOLAX) packet 17 g, 17 g, Oral, Daily, Eugenie Filler, MD, 17 g at 11/20/18 0953 .  ramelteon (ROZEREM) tablet 8 mg, 8 mg, Oral, QHS, Alice Vitelli, Cletis Athens, MD .  senna-docusate (Senokot-S) tablet 1 tablet, 1 tablet, Oral, QHS, Eugenie Filler, MD, 1 tablet at 11/20/18 2300 .  senna-docusate (Senokot-S) tablet 1 tablet, 1  tablet, Oral, QHS PRN, Eugenie Filler, MD .  tamsulosin [facility]) capsule 0.4 mg, 0.4 mg, Oral, Daily, Eugenie Filler, MD, 0.4 mg at 11/21/18 1229 .  valproate (DEPACON) 250 mg in dextrose 5 % 50 mL IVPB, 250 mg, Intravenous, Q12H, Jacey Eckerson, Cletis Athens, MD, Last Rate: 52.5 mL/hr at 11/23/18 0558, 250 mg at 11/23/18 0558   Exam: Current vital signs: BP (!) 177/69 (BP Location: Right Arm)   Pulse 63   Temp 98.6 F (37 C) (Oral)   Resp 16   Ht 5\' 9"  (1.753 m)   Wt 73.5 kg   SpO2 100%   BMI 23.92 kg/m  Vital signs in last 24 hours: Temp:  [98.6 F (37 C)-99.5 F (  37.5 C)] 98.6 F (37 C) (01/15 0756) Pulse Rate:  [60-63] 63 (01/15 0756) Resp:  [16-20] 16 (01/15 0756) BP: (157-177)/(66-75) 177/69 (01/15 0756) SpO2:  [96 %-100 %] 100 % (01/15 0855) Weight:  [73.5 kg] 73.5 kg (01/15 0500)  Physical Exam  Constitutional: Appears well-developed and well-nourished.  Psych: Affect appropriate to situation Eyes: No scleral injection HENT: No OP obstrucion Head: Normocephalic.  Cardiovascular: Normal rate and regular rhythm.  Respiratory: Effort normal, non-labored breathing GI: Soft.  No distension. There is no tenderness.  Skin: WDI  Neuro: AAOx1, fluent speech, no dysarthria, follows commands from daughter PERRL Held b/l UE antigravity Wiggles toes b/l  Labs   CBC    Component Value Date/Time   WBC 4.8 11/23/2018 0558   RBC 2.40 (L) 11/23/2018 0558   HGB 7.2 (L) 11/23/2018 0558   HGB 10.3 (L) 12/10/2017 1203   HGB 11.0 (L) 12/25/2016 1030   HCT 24.0 (L) 11/23/2018 0558   HCT 31.8 (L) 12/10/2017 1203   HCT 32.2 (L) 12/25/2016 1030   PLT 128 (L) 11/23/2018 0558   PLT 129 (L) 12/10/2017 1203   MCV 100.0 11/23/2018 0558   MCV 99 (H) 12/10/2017 1203   MCV 96.0 12/25/2016 1030   MCH 30.0 11/23/2018 0558   MCHC 30.0 11/23/2018 0558   RDW 16.7 (H) 11/23/2018 0558   RDW 15.4 12/10/2017 1203   RDW 13.9 12/25/2016 1030   LYMPHSABS 0.6 (L) 11/23/2018 0558   LYMPHSABS  1.0 12/25/2016 1030   MONOABS 0.6 11/23/2018 0558   MONOABS 0.6 12/25/2016 1030   EOSABS 0.2 11/23/2018 0558   EOSABS 0.3 12/25/2016 1030   BASOSABS 0.0 11/23/2018 0558   BASOSABS 0.0 12/25/2016 1030    CMP     Component Value Date/Time   NA 146 (H) 11/23/2018 0558   NA 140 12/10/2017 1203   NA 145 01/19/2014 0814   K 4.3 11/23/2018 0558   K 3.8 01/19/2014 0814   CL 107 11/23/2018 0558   CL 109 (H) 01/20/2013 0842   CO2 23 11/23/2018 0558   CO2 26 01/19/2014 0814   GLUCOSE 73 11/23/2018 0558   GLUCOSE 98 01/19/2014 0814   GLUCOSE 114 (H) 01/20/2013 0842   BUN 56 (H) 11/23/2018 0558   BUN 23 12/10/2017 1203   BUN 17.1 01/19/2014 0814   CREATININE 2.40 (H) 11/23/2018 0558   CREATININE 1.23 (H) 01/14/2017 0831   CREATININE 1.2 01/19/2014 0814   CALCIUM 8.5 (L) 11/23/2018 0558   CALCIUM 9.4 01/19/2014 0814   PROT 7.9 11/15/2018 1603   PROT 6.8 07/28/2017 0813   PROT 7.6 01/19/2014 0814   ALBUMIN 2.3 (L) 11/23/2018 0558   ALBUMIN 4.0 07/28/2017 0813   ALBUMIN 3.4 (L) 01/19/2014 0814   AST 11 (L) 11/15/2018 1603   AST 11 01/19/2014 0814   ALT 7 11/15/2018 1603   ALT 8 01/19/2014 0814   ALKPHOS 55 11/15/2018 1603   ALKPHOS 92 01/19/2014 0814   BILITOT 0.8 11/15/2018 1603   BILITOT 0.6 07/28/2017 0813   BILITOT 0.51 01/19/2014 0814   GFRNONAA 23 (L) 11/23/2018 0558   GFRNONAA 54 (L) 01/23/2014 1126   GFRAA 26 (L) 11/23/2018 0558   GFRAA 62 01/23/2014 1126    Lipid Panel     Component Value Date/Time   CHOL 91 11/22/2018 0822   CHOL 128 07/28/2017 0813   TRIG 78 11/22/2018 0822   HDL 26 (L) 11/22/2018 0822   HDL 59 07/28/2017 0813   CHOLHDL 3.5 11/22/2018 7353  VLDL 16 11/22/2018 0822   LDLCALC 49 11/22/2018 0822   LDLCALC 54 07/28/2017 0813   LDLDIRECT 55 12/10/2011 0806     Imaging I have reviewed the images obtained:  CT-scan of the brain   A/P: 85 yo M with PMHx of HLD, CAD s/p PPM, CABG, Afib not on [facility], CVA, possible seizure d/o who suddenly  experienced slurred speech and myoclonic jerks on 1/13 with no acute pathology on head CT.  Pt's encephalopathy and delirium are most likely toxic/metabolic in etiology.  Routine EEG c/w R temporal sharp waves but no seizure activity. Cont Depakote 250mg  q12 Ramelteon at bedtime F/u depakote level in AM Repeat head CT: no evidence of new stroke Cont Abx for UTI Cont care as per primary team  D/w family at bedside  Total time spent 57min  Ray Church, MD Attending Neurologist

## 2018-11-23 NOTE — Progress Notes (Addendum)
  Speech Language Pathology Treatment:    Patient Details Name: John Peck MRN: [id number] DOB: 1923/12/20 Today's Date: 11/23/2018 Time: 9528-4132 SLP Time Calculation (min) (ACUTE ONLY): 22 min  Assessment / Plan / Recommendation Clinical Impression  Pt today agreeable to consume po intake - icecream and juice - denies desire for pudding.  SlP sat pt upright in bed using reverse trendelenberg and provided him intake via tip of straw *as he did not form suction on straw x3*, and icecream via tsp.  He required frequent verbal stimulation to open his mouth to accept intake.  Pt with oral holding suspected with delayed swallow but no indications of aspiration.  After just a few boluses, pt declined to consume more po.  Recommend pt be allowed floor stock items with strict precautions and use of oral suction if does not elicit swallow.  Anticipate pt will only take a few bites/sips - not palliative referral pending.  Will follow up for family education.   Pt seen by this SLP November 16, 2018 and was able to feed himself with great tolerance.  He verbalized the food at the facility had "no taste" and he requested his "meats chopped".    Of note, pt repeatedly stated he wanted his soul saved by the [facility] and he wants to see the Lord during the session.  Provided him with emotional/verbal comfort as able.  Pt was left with bed lowered and his eyes closed and calm.    HPI HPI: 85 year old male admitted 11/15/2018 with right inguinal hernia complaint. Patient noted on 11/21/2018 to have dysarthric speech and code stroke has been activated PMH: COPD, CVA, prior smoker, SSS, chronic kidney disease, CHF, chronic anemia.  CT 11/21/2018 = no acute hemorrhage      SLP Plan  Continue with current plan of care       Recommendations  Diet recommendations: Other(comment) Liquids provided via: (tip of straw ) Medication Administration: Crushed with puree Supervision: Full supervision/cueing for compensatory  strategies;Staff to assist with self feeding Compensations: Slow rate;Small sips/bites;Other (Comment)(oral suction if pt does not swallow) Postural Changes and/or Swallow Maneuvers: Seated upright 90 degrees;Upright 30-60 min after meal(fully upright)                Oral Care Recommendations: Oral care QID Follow up Recommendations: (TBD) SLP Visit Diagnosis: Dysphagia, unspecified (R13.10) Plan: Continue with current plan of care       GO                Macario Golds 11/23/2018, 3:14 PM   Luanna Salk, [place] [facility] SLP [place] Pager [phone] Office [phone]

## 2018-11-23 NOTE — Care Management Note (Signed)
Case Management Note  Patient Details  Name: ALOYS HUPFER MRN: [id number] Date of Birth: 1924-01-21  Subjective/Objective:                  Return to top of Renal Failure, Chronic RRG - [place]  Discharge readiness is indicated by patient meeting Recovery Milestones, including ALL of the following: ? Hemodynamic stability yes ? Hypoxemia absent  o2 via [place] at 2l/min ? Tachypnea absent  20 ? Pulmonary edema absent or acceptable for next level of care no bilateral plueral effusions ? Electrolyte and acid-base status at baseline or acceptable for next level of care  Bun-56/creatinie=2.40 ? Mental status at baseline responvie to verbal stimuli ? Nausea and vomiting absent or controlled and acceptable for next level of care  controilled ? Dialysis not needed or access and plan established n/a ? Ambulatory  no ? Oral hydration, medications, and diet ? Iv maxipime, iv depacon ? Level of care - med surg   Action/Plan: Following for progression of care and condition. Following for cm needs none present at this time.  Expected Discharge Date:  11/18/18               Expected Discharge Plan:     In-House Referral:     Discharge planning Services     Post Acute Care Choice:    Choice offered to:     DME Arranged:    DME Agency:     HH Arranged:    HH Agency:     Status of Service:     If discussed at [employer], dates discussed:    Additional Comments:  Leeroy Cha, RN 11/23/2018, 9:41 AM

## 2018-11-24 ENCOUNTER — Inpatient Hospital Stay (HOSPITAL_COMMUNITY): Payer: Medicare HMO

## 2018-11-24 DIAGNOSIS — Z515 Encounter for palliative care: Secondary | ICD-10-CM

## 2018-11-24 DIAGNOSIS — N179 Acute kidney failure, unspecified: Principal | ICD-10-CM

## 2018-11-24 DIAGNOSIS — N183 Chronic kidney disease, stage 3 (moderate): Secondary | ICD-10-CM

## 2018-11-24 DIAGNOSIS — F0390 Unspecified dementia without behavioral disturbance: Secondary | ICD-10-CM

## 2018-11-24 DIAGNOSIS — Z66 Do not resuscitate: Secondary | ICD-10-CM

## 2018-11-24 DIAGNOSIS — R627 Adult failure to thrive: Secondary | ICD-10-CM

## 2018-11-24 LAB — RENAL FUNCTION PANEL
ANION GAP: 12 (ref 5–15)
Albumin: 2.6 g/dL — ABNORMAL LOW (ref 3.5–5.0)
BUN: 55 mg/dL — ABNORMAL HIGH (ref 8–23)
CO2: 25 mmol/L (ref 22–32)
Calcium: 8.8 mg/dL — ABNORMAL LOW (ref 8.9–10.3)
Chloride: 112 mmol/L — ABNORMAL HIGH (ref 98–111)
Creatinine, Ser: 2.53 mg/dL — ABNORMAL HIGH (ref 0.61–1.24)
GFR calc Af Amer: 25 mL/min — ABNORMAL LOW (ref >60)
GFR calc non Af Amer: 21 mL/min — ABNORMAL LOW (ref >60)
Glucose, Bld: 79 mg/dL (ref 70–99)
Phosphorus: 3.4 mg/dL (ref 2.5–4.6)
Potassium: 4.6 mmol/L (ref 3.5–5.1)
Sodium: 149 mmol/L — ABNORMAL HIGH (ref 135–145)

## 2018-11-24 LAB — TYPE AND SCREEN
ABO/RH(D): O POS
Antibody Screen: NEGATIVE

## 2018-11-24 LAB — HEPATIC FUNCTION PANEL
ALT: 35 U/L (ref 0–44)
AST: 48 U/L — AB (ref 15–41)
Albumin: 2.5 g/dL — ABNORMAL LOW (ref 3.5–5.0)
Alkaline Phosphatase: 41 U/L (ref 38–126)
Bilirubin, Direct: 0.1 mg/dL (ref 0.0–0.2)
Indirect Bilirubin: 0.8 mg/dL (ref 0.3–0.9)
TOTAL PROTEIN: 7.2 g/dL (ref 6.5–8.1)
Total Bilirubin: 0.9 mg/dL (ref 0.3–1.2)

## 2018-11-24 LAB — CBC
HCT: 27.7 % — ABNORMAL LOW (ref 39.0–52.0)
Hemoglobin: 8.1 g/dL — ABNORMAL LOW (ref 13.0–17.0)
MCH: 29.2 pg (ref 26.0–34.0)
MCHC: 29.2 g/dL — ABNORMAL LOW (ref 30.0–36.0)
MCV: 100 fL (ref 80.0–100.0)
Platelets: 136 10*3/uL — ABNORMAL LOW (ref 150–400)
RBC: 2.77 MIL/uL — ABNORMAL LOW (ref 4.22–5.81)
RDW: 16.7 % — ABNORMAL HIGH (ref 11.5–15.5)
WBC: 4.2 10*3/uL (ref 4.0–10.5)
nRBC: 0 % (ref 0.0–0.2)

## 2018-11-24 LAB — ABO/RH: ABO/RH(D): O POS

## 2018-11-24 LAB — VALPROIC ACID LEVEL: Valproic Acid Lvl: 24 ug/mL — ABNORMAL LOW (ref 50.0–100.0)

## 2018-11-24 LAB — MAGNESIUM: Magnesium: 2.1 mg/dL (ref 1.7–2.4)

## 2018-11-24 MED ORDER — DEXTROSE-NACL 5-0.45 % IV SOLN
INTRAVENOUS | Status: DC
  Administered 2018-11-24: 14:00:00 via INTRAVENOUS

## 2018-11-24 MED ORDER — SODIUM CHLORIDE 0.9 % IV SOLN
1.0000 g | Freq: Once | INTRAVENOUS | Status: AC
  Administered 2018-11-24: 1 g via INTRAVENOUS
  Filled 2018-11-24: qty 1

## 2018-11-24 MED ORDER — DEXTROSE 5 % IV SOLN
INTRAVENOUS | Status: AC
  Administered 2018-11-24: 21:00:00 via INTRAVENOUS

## 2018-11-24 NOTE — Progress Notes (Signed)
  Speech Language Pathology Treatment: Dysphagia  Patient Details Name: John Peck MRN: [id number] DOB: 02/16/1924 Today's Date: 11/24/2018 Time: 9024-0973 SLP Time Calculation (min) (ACUTE ONLY): 30 min  Assessment / Plan / Recommendation Clinical Impression  Pt was in xray having CXR upon SLP arrival to room.  Daughter present and 2nd daughter arrived.  Pt returned from xray but was sleeping and family did not want him disturbed.  CXR today showed Small bilateral pleural effusions. No pulmonary edema or focal pneumonia - 11/24/2018.    SLP had extensive conversation with twin daughters re: their father's current status, lethargy, dysphagia, risk for aspiration with mentation/dementia and concept of comfort feeding.  Provided them with detailed information re: comfort feeding which they both stated they found very helpful.  Advised that aspiration often can not be prevented with pt's with their father's comorbidities and current status.  Reviewed as pt is progressively declining *as they stated to [name], NP per my conversation with [facility] after their meeting* with advanced age and dementia - functional reserve becomes compromised to extreme extent. Pt dysphagia/mentation may prohibit adequacy of intake for nutrition and functional care plan may be for pt to have comfort intake only.  Daughters were tearful for a short amount of time but reported understanding of information provided and agreeable to plan/recommendations.   Reviewed comfort intake possibly indication modification of diet from tsp amounts to moisture/flavoring from [place]. Ability to consume intake for comfort needs to be based on pt status at that time -he should be fully alert, fully upright with clear voice, desiring something by mouth and not overtly coughing or having discomfort.    Advised if he does not swallow, oral suction is indicated.    Pt's current level of dysphagia/mentation does not allow him to consume  adequate po for nutrition.  Messaged MD with information from meeting and he requested SLP to return next date to see pt.  Will follow up next date as ordered. Thanks for allowing me to help care for this pt.      HPI HPI: 85 year old male admitted 11/15/2018 with right inguinal hernia complaint. Patient noted on 11/21/2018 to have dysarthric speech and code stroke has been activated PMH: COPD, CVA, prior smoker, SSS, chronic kidney disease, CHF, chronic anemia.  CT 11/21/2018 = no acute hemorrhage      SLP Plan  Continue with current plan of care       Recommendations  Diet recommendations: Other(comment)(liquids from tip of straw and icecream for enjoyment/comfort/ NOT nutritional support) Liquids provided via: Teaspoon(tip of straw) Medication Administration: Via alternative means                Oral Care Recommendations: Oral care QID Follow up Recommendations: None SLP Visit Diagnosis: Dysphagia, oropharyngeal phase (R13.12) Plan: Continue with current plan of care       GO                Macario Golds 11/24/2018, 4:56 PM Luanna Salk, [facility] SLP [place] Pager [phone] Office [phone]

## 2018-11-24 NOTE — Progress Notes (Signed)
PROGRESS NOTE    John Peck  IPJ:[id number] DOB: 1923/11/18 DOA: 11/15/2018 PCP: Seward Carol, MD   Brief Narrative:  Per Dr. Wynetta Emery- Thoma Paulsen Spruillis Mechell Girgis 85 y.o.malewith medical history significant ofchronic kidney disease, CHF, chronic anemia presented with right-sided inguinal hernia complaints. It is been hurting him over the past 2 weeks. Has been treated supportively at the nursing facility with Tylenol and ibuprofen. Was no inguinal hernia on my or ER clinicians physical exam. It was also not seen on abdominal pelvic CT. Patient denied any chest pain shortness of breath, orfever. He does follow Rilla Buckman fluid restricted diet at the facility. He also eats Eri Mcevers mechanical soft diet, andwas not eating very well so family has been giving him additional food to entice him to eat as well. Patient was unsure if he has had Lavoris Sparling urinary changes. Had Katharina Jehle bowel movements morning but it was small. ED Course:Patient CT scan of abdomen and pelvis that did not show any inguinal hernia. Also did not show any stones or hydronephrosis. Patient does have Zayvier Caravello chronic right-sided pleural effusion that was present again today. He was found to have acute on chronic renal failure with Tearah Saulsbury creatinine of 4.4 with prior being 1.8. He also had some white cells in his urine and has had previous UTI in the past.  Patient noted on 11/21/2018 to have dysarthric speech and code stroke has been activated.   Assessment & Plan:   Principal Problem:   Dysarthria Active Problems:   PAF (paroxysmal atrial fibrillation) (HCC)   CAD in native artery   Chronic systolic CHF (congestive heart failure) (HCC)   Hypothyroidism   Pleural effusion   Renal failure (ARF), acute on chronic (HCC)   Constipation   Pyuria   Dementia (HCC)   Acidosis   Acute on chronic systolic CHF (congestive heart failure) (HCC)   SOB (shortness of breath)   #1 dysarthric speech  myoclonic jerks  Toxic Metabolic  Encephalopathy Patient noted to have dysarthric speech between 130p to 2 PM on 11/21/2018.  Patient noted to have incomprehensible speech over the previous 24-36 hours.  Patient also noted to have some myoclonic jerks.   Code stroke was initiated stat head CT negative for any acute abnormalities.   Patient unable to get Jasir Rother MRI due to pacemaker.   Patient was seen by tele neurology - recommending EEG, CTA, but family did not want to pursue due to renal function Echo with EF 55-60% (see report) Carotid dopplers with 88-99% stenosis in R ICA, 40-59% stenosis in L carotid EEG with area of potential epileptogenicity in R temporal region - neuro recommending continue depakote - dose increased per neurology today. CT head with old L basal ganglia lacunar infarct.  Low attenuation area within R internal capsule may represent small lacunar infarct, but reviewed by neurology and doesn't appear c/w new lacunar infarct Suspicion at this point is toxic/metabolic per neuro, recommending continue abx for UTI Appreciate neurology recs PT/OT/SLP Today he's more alert, but still confused, not taking PO well  Dysphagia: speech recommending comfort feeding at this time.  Will continue to follow..   # Pseudomonas UTI - continue cefepime -> change to ceftaz with concern for cefepime and seizure threshold per pharmacy  # Concern for aspiration: pt with rattling in upper airway, concern for aspiration.  CXR ordered and notable for bilateral effusions, small.  Speech recs as noted above.   2 acute on chronic renal failure stage III/acidosis Suspected to be related to diuretics  and NSAID use Questionable etiology. Creatinine 4.46 on presentation, baseline is ~1.8 Worsened today to 2.53, started on d5 1/2 NS -> transitioned to D5, will need to f/u repeat in AM, may need something more isotonic, but with hypernatremia will continue D5 for now Hold lasix UA without protein, 0-5 RBC, CT without evidence of hydro Has  been restarted on lasix Ranexa on hold Continue to monitor  Hypernatremia: receiving D5 at low rate as noted above.  Holding lasix.  3.  Paroxysmal atrial fibrillation Continue Coreg for rate control.  Continue aspirin and Plavix.  Follow.  Will need to look into why not on anticoagulation if hx of afib?  4.  Acute on chronic diastolic heart failure  CAD Pt appeared volume overloaded on exam per previous provider and lasix restarted Hold lasix with above. Continue Coreg, aspirin, Plavix, Imdur, Lipitor.  5.  Hypothyroidism Continue home dose Synthroid.  6.  Constipation Likely etiology of patient's abdominal pain and concern for inguinal hernia.  CT abdomen and pelvis negative for hernia.  Patient with large bowel movements per RN.  Patient refusing Dulcolax suppositories and oral MiraLAX.  Patient with some complaints of abdominal discomfort on 11/19/2018 which is since improving.  Continue Dulcolax suppositories daily, MiraLAX, Senokot-S.  Will need Simren Popson bowel regimen on discharge.   7.  Chronic pleural effusion Stable.  Monitor closely with volume overload.   Holding lasix for now with hypernatremia, follow with IVF as noted above.  8.  Dementia Stable.  10 chronic anemia Relatively stable, but low Labs suggestive of AOCD B12, folate normal Continue to monitor  11.  Probable neuropathy of feet Patient started on Neurontin 100 mg daily.  Discontinued Neurontin on 11/21/2018 due to patient's dysarthric speech and myoclonic jerks.  Tylenol as needed.  12.  Shortness of breath This has improved, previously 2/2 HF. Repeat CXR today with bilateral effusions. Concern for aspiration.   Continue to monitor closely.   Mild hypernatremia: follow  Goals of care: Had discussion with John Peck today.  Completed most.  Plan to treat treatable here, but at discharge, will likely discharge with hospice (see most) and palliative care note (pending).   DVT prophylaxis:  heparin Code Status: DNR Family Communication: daughters and wife at bedside Disposition Plan: pending further improvement   Consultants:   Neurology  Procedures:  US Carotid Summary: Right Carotid: Velocities in the right ICA are consistent with Joushua Dugar 80-99%                stenosis.  Left Carotid: Velocities in the left ICA are consistent with Bearl Talarico 40-59% stenosis.  Vertebrals: Bilateral vertebral arteries demonstrate antegrade flow.  Echo Study Conclusions  - Left ventricle: Abnormal septal motion. The cavity size was   normal. Wall thickness was increased in Karleen Seebeck pattern of mild LVH.   Systolic function was normal. The estimated ejection fraction was   in the range of 55% to 60%. The study is not technically   sufficient to allow evaluation of LV diastolic function. - Aortic valve: There was trivial regurgitation. - Mitral valve: There was mild regurgitation. - Left atrium: The atrium was mildly dilated. - Right atrium: The atrium was mildly dilated. - Atrial septum: No defect or patent foramen ovale was identified. - Pulmonary arteries: PA peak pressure: 50 mm Hg (S).  Antimicrobials:  Anti-infectives (From admission, onward)   Start     Dose/Rate Route Frequency Ordered Stop   11/24/18 1800  cefTAZidime (FORTAZ) 1 g in sodium chloride 0.9 %  100 mL IVPB     1 g 200 mL/hr over 30 Minutes Intravenous  Once 11/24/18 0954 11/24/18 1810   11/18/18 1800  ceFEPIme (MAXIPIME) 2 g in sodium chloride 0.9 % 100 mL IVPB  Status:  Discontinued     2 g 200 mL/hr over 30 Minutes Intravenous Every 24 hours 11/18/18 1152 11/24/18 0954   11/17/18 1800  ceFEPIme (MAXIPIME) 1 g in sodium chloride 0.9 % 100 mL IVPB  Status:  Discontinued     1 g 200 mL/hr over 30 Minutes Intravenous Every 24 hours 11/17/18 1433 11/18/18 1152   11/17/18 0900  piperacillin-tazobactam (ZOSYN) IVPB 2.25 g  Status:  Discontinued     2.25 g 100 mL/hr over 30 Minutes Intravenous Every 8 hours 11/17/18 0817 11/17/18  1402   11/17/18 0815  piperacillin-tazobactam (ZOSYN) IVPB 3.375 g  Status:  Discontinued     3.375 g 12.5 mL/hr over 240 Minutes Intravenous Every 8 hours 11/17/18 0811 11/17/18 0817   11/15/18 2000  cefTRIAXone (ROCEPHIN) 1 g in sodium chloride 0.9 % 100 mL IVPB  Status:  Discontinued     1 g 200 mL/hr over 30 Minutes Intravenous Every 24 hours 11/15/18 1840 11/17/18 0811     Subjective: More alert today. Crying at times, emotional. Denies any complaint. Still confused. Jaeshaun Riva&Ox2.  Objective: Vitals:   11/23/18 1327 11/23/18 2030 11/24/18 0540 11/24/18 2027  BP: (!) 163/64 (!) 144/65 (!) 164/75 (!) 175/77  Pulse: (!) 59 (!) 59 60 (!) 59  Resp: 16 (!) 21 20 20   Temp: 99.2 F (37.3 C) 98.5 F (36.9 C) 98.8 F (37.1 C) 98.2 F (36.8 C)  TempSrc: Oral Oral Oral   SpO2: 100% 100% 92% 97%  Weight:   71.7 kg   Height:        Intake/Output Summary (Last 24 hours) at 11/24/2018 2029 Last data filed at 11/24/2018 1800 Gross per 24 hour  Intake 392.92 ml  Output 650 ml  Net -257.08 ml   Filed Weights   11/22/18 0534 11/23/18 0500 11/24/18 0540  Weight: 75.1 kg 73.5 kg 71.7 kg    Examination:  General: No acute distress. Tearful at times. Cardiovascular: Heart sounds show Dereon Williamsen regular rate, and rhythm.  Lungs: Clear to auscultation bilaterally Abdomen: Soft, nontender, nondistended  Neurological: Alert and oriented 2. Moves all extremities 4. Cranial nerves II through XII grossly intact. Skin: Warm and dry. No rashes or lesions. Extremities: No clubbing or cyanosis. No edema.   Data Reviewed: I have personally reviewed following labs and imaging studies  CBC: Recent Labs  Lab 11/18/18 0655 11/20/18 0647 11/21/18 0645 11/22/18 0822 11/23/18 0558 11/24/18 0533  WBC 4.0  --  5.2 4.9 4.8 4.2  NEUTROABS  --   --   --   --  3.4  --   HGB 7.8* 7.4* 7.5* 7.3* 7.2* 8.1*  HCT 25.1* 23.7* 24.0* 24.0* 24.0* 27.7*  MCV 97.7  --  97.6 100.0 100.0 100.0  PLT 149*  --  122*  126* 128* 025*   Basic Metabolic Panel: Recent Labs  Lab 11/20/18 0647 11/21/18 0645 11/22/18 0822 11/23/18 0558 11/24/18 0533  NA 142 142 145 146* 149*  K 4.4 4.6 4.2 4.3 4.6  CL 106 104 109 107 112*  CO2 27 29 26 23 25   GLUCOSE 120* 96 74 73 79  BUN 59* 62* 57* 56* 55*  CREATININE 2.68* 2.59* 2.55* 2.40* 2.53*  CALCIUM 8.2* 8.5* 8.5* 8.5* 8.8*  MG  --  2.0  --   --  2.1  PHOS 2.9 3.2 4.0 3.8 3.4   GFR: Estimated Creatinine Clearance: 18.6 mL/min (Cashius Grandstaff) (by C-G formula based on SCr of 2.53 mg/dL (H)). Liver Function Tests: Recent Labs  Lab 11/20/18 0647 11/21/18 0645 11/22/18 0822 11/23/18 0558 11/24/18 0533  AST  --   --   --   --  48*  ALT  --   --   --   --  35  ALKPHOS  --   --   --   --  41  BILITOT  --   --   --   --  0.9  PROT  --   --   --   --  7.2  ALBUMIN 2.3* 2.5* 2.5* 2.3* 2.5*  2.6*   No results for input(s): LIPASE, AMYLASE in the last 168 hours. No results for input(s): AMMONIA in the last 168 hours. Coagulation Profile: No results for input(s): INR, PROTIME in the last 168 hours. Cardiac Enzymes: No results for input(s): CKTOTAL, CKMB, CKMBINDEX, TROPONINI in the last 168 hours. BNP (last 3 results) No results for input(s): PROBNP in the last 8760 hours. HbA1C: Recent Labs    11/22/18 0822  HGBA1C 5.9*   CBG: Recent Labs  Lab 11/21/18 1436  GLUCAP 105*   Lipid Profile: Recent Labs    11/22/18 0822  CHOL 91  HDL 26*  LDLCALC 49  TRIG 78  CHOLHDL 3.5   Thyroid Function Tests: No results for input(s): TSH, T4TOTAL, FREET4, T3FREE, THYROIDAB in the last 72 hours. Anemia Panel: No results for input(s): VITAMINB12, FOLATE, FERRITIN, TIBC, IRON, RETICCTPCT in the last 72 hours. Sepsis Labs: No results for input(s): PROCALCITON, LATICACIDVEN in the last 168 hours.  Recent Results (from the past 240 hour(s))  Urine culture     Status: Abnormal   Collection Time: 11/15/18  6:26 PM  Result Value Ref Range Status   Specimen  Description   Final    URINE, RANDOM Performed at [facility] [address], [place], [place] [address]    Special Requests   Final    NONE Performed at [facility] [place] [address], [place], [place] [address]    Culture >=100,000 COLONIES/mL PSEUDOMONAS AERUGINOSA (Vonzell Lindblad)  Final   Report Status 11/18/2018 FINAL  Final   Organism ID, Bacteria PSEUDOMONAS AERUGINOSA (Angeline Trick)  Final      Susceptibility   Pseudomonas aeruginosa - MIC*    CEFTAZIDIME 4 SENSITIVE Sensitive     CIPROFLOXACIN <=0.25 SENSITIVE Sensitive     GENTAMICIN <=1 SENSITIVE Sensitive     IMIPENEM 2 SENSITIVE Sensitive     PIP/TAZO <=4 SENSITIVE Sensitive     CEFEPIME 4 SENSITIVE Sensitive     * >=100,000 COLONIES/mL PSEUDOMONAS AERUGINOSA  MRSA PCR Screening     Status: None   Collection Time: 11/15/18 11:15 PM  Result Value Ref Range Status   MRSA by PCR NEGATIVE NEGATIVE Final    Comment:        The GeneXpert MRSA Assay (FDA approved for NASAL specimens only), is one component of Rayquan Amrhein comprehensive MRSA colonization surveillance program. It is not intended to diagnose MRSA infection nor to guide or monitor treatment for MRSA infections. Performed at [facility], [place] [address] [place], [place] [address]          Radiology Studies: Dg Chest 2 View  Result Date: 11/24/2018 CLINICAL DATA:  Chronic kidney disease and congestive heart failure. EXAM: CHEST -  2 VIEW COMPARISON:  November 21, 2018 FINDINGS: The heart size and mediastinal contours are stable. The heart size is mildly enlarged. Cardiac pacemaker is unchanged compared prior exam. There are small bilateral pleural effusions. There is no pulmonary edema or focal pneumonia. The visualized skeletal structures are stable. IMPRESSION: Small bilateral pleural effusions. No pulmonary edema or focal pneumonia. Electronically Signed   By: Abelardo Diesel M.D.   On: 11/24/2018 16:16        Scheduled  Meds: . acetaminophen  650 mg Oral TID  . aspirin  300 mg Rectal Daily  . atorvastatin  40 mg Oral QHS  . bisacodyl  10 mg Rectal Daily  . clopidogrel  75 mg Oral Q breakfast  . cyanocobalamin  1,000 mcg Intramuscular Daily  . ezetimibe  10 mg Oral Daily  . feeding supplement (ENSURE ENLIVE)  237 mL Oral BID BM  . ferrous sulfate  325 mg Oral BID WC  . heparin  5,000 Units Subcutaneous Q8H  . hydrocortisone cream   Topical BID  . isosorbide mononitrate  90 mg Oral Daily  . latanoprost  1 drop Both Eyes Once per day on Mon Wed  . levothyroxine  60 mcg Intravenous Daily  . metoprolol tartrate  5 mg Intravenous Q8H  . mirtazapine  7.5 mg Oral QHS  . pantoprazole (PROTONIX) IV  40 mg Intravenous Q24H  . polyethylene glycol  17 g Oral Daily  . ramelteon  8 mg Oral QHS  . senna-docusate  1 tablet Oral QHS  . tamsulosin  0.4 mg Oral Daily   Continuous Infusions: . dextrose 5 % and 0.45% NaCl 75 mL/hr at 11/24/18 1333  . valproate sodium 250 mg (11/24/18 0546)     LOS: 9 days    Time spent: over 30 min    Fayrene Helper, MD [facility] Pager see AMION  If 7PM-7AM, please contact night-coverage [email or web address] Password [facility] 11/24/2018, 8:29 PM

## 2018-11-24 NOTE — Progress Notes (Signed)
PT Cancellation Note  Patient Details Name: John Peck MRN: [id number] DOB: 1924-04-29   Cancelled Treatment:    Reason Eval/Treat Not Completed: Patient declined, no reason specified - Pt with eyes closed upon PT arrival to room. When roused, pt was tearful and stated "I can't do no therapy, please let me rest". Per pt's granddaughter at bedside, pt is very confused today. PT to check back as schedule allows.  [name], PT Acute Rehabilitation Services Pager [phone]  Office [phone]    Makhai Fulco D Elonda Husky 11/24/2018, 1:20 PM

## 2018-11-24 NOTE — Progress Notes (Signed)
Pt refusing to wear his oxygen, refuses to take any medications, refused moist mouthswab. States hes gonna slap me if io dont leave him alone. Talking, mumbling, singing or praying to God to just let him die.

## 2018-11-24 NOTE — Care Management Important Message (Signed)
Important Message  Patient Details  Name: John Peck MRN: [id number] Date of Birth: 07-09-1924   Medicare Important Message Given:  Yes    Kerin Salen 11/24/2018, 10:52 AMImportant Message  Patient Details  Name: John Peck MRN: [id number] Date of Birth: 07-23-1924   Medicare Important Message Given:  Yes    Kerin Salen 11/24/2018, 10:52 AM

## 2018-11-24 NOTE — Progress Notes (Signed)
Valproic acid level is 24 this AM. Increasing dosage to 500 mg BID.   Electronically signed: Dr. Kerney Elbe

## 2018-11-24 NOTE — Progress Notes (Signed)
LCSW following for dc planning.  Patient from [place] at [facility] place.   John Peck

## 2018-11-24 NOTE — Consult Note (Signed)
Consultation Note Date: 11/24/2018   Patient Name: John Peck  DOB: Aug 29, 1924  MRN: [id number]  Age / Sex: 85 y.o., male  PCP: Seward Carol, MD Referring Physician: Elodia Florence., *  Reason for Consultation: Establishing goals of care and Psychosocial/spiritual support  HPI/Patient Profile: 85 y.o. male  admitted on 11/15/2018 with past medical history significant of chronic kidney disease, cognitive deficit,  CHF, chronic anemia, A-fib, HTN,  presents with right-sided inguinal hernia complaints.  Some question of torso twitching movement,  EEG with changes but no seizure activity.  CRF with creatine 4.4.  UTI/pseudomonas  In the ED  CT scan of abdomen and pelvis that did not show any inguinal hernia.  Also did not show any stones or hydronephrosis.  Patient does have a chronic right-sided pleural effusion that was present again today.  He was found to have acute on chronic renal failure with a creatinine of 4.4 with prior being 1.8.  He also had some white cells in his urine and has had previous UTI in the past.  Patient has had slow ongoing  physical, functional and cognitive decline.  Per his wife patient would not want aggressive life prolonging measures; ie--artificial feeding, dialysis, resuscitation.   Patient does not have medical decision making capacity--family face treatment options decisions, advanced directive decisions and anticipatory care needs.  Clinical Assessment and Goals of Care:   This NP [name] reviewed medical records, received report from team, assessed the patient and then meet at the patient's bedside along with his wife and three daughteres  to discuss diagnosis, prognosis, GOC, EOL wishes disposition and options.  Concept of Hospice and Palliative Care were discussed   Hard Choices left for review  We discussed concepts related to human mortality, and the  limitations of medical interventions when the body  fails to thrive.  We discussed quality of life.  A detailed discussion was had today regarding advanced directives.  Concepts specific to code status, artifical feeding and hydration, continued IV antibiotics and rehospitalization was had.  The difference between a aggressive medical intervention path  and a palliative comfort care path for this patient at this time was had.  Values and goals of care important to patient and family were attempted to be elicited.  MOST form  Completed- copy in chart  Natural trajectory and expectations at EOL were discussed.  Questions and concerns addressed.   Family encouraged to call with questions or concerns.    PMT will continue to support holistically.  HPOA/daughter Dorian Pod    SUMMARY OF RECOMMENDATIONS    Code Status/Advance Care Planning:  DNR   no artificial feeding now or in the future   Palliative Prophylaxis:   Aspiration, Bowel Regimen, Delirium Protocol, Frequent Pain Assessment and Oral Care   Discussed his weakness and secondary dysphagia and poor po intake.  Concept of comfort feeds and natural dehydration addressed  Additional Recommendations (Limitations, Scope, Preferences):  When patient is discharged from the hospital the completed MOST form will activate, see form  in hard chart  Avoid Hospitalization, No Artificial Feeding, No IV Antibiotics and No IV Fluids   High risk for decompensation  Psycho-social/Spiritual:   Desire for further Chaplaincy support:yes  Additional Recommendations: Education on Hospice and Grief/Bereavement Support  Prognosis:   < 6 months  Discharge Planning: Activate hospice benefit when patient dischargers back to SNF--- see MOST form   [place] with Hospice      Primary Diagnoses: Present on Admission: . Renal failure (ARF), acute on chronic ([place]) . PAF (paroxysmal atrial fibrillation) ([place]) . CAD in native  artery . Chronic systolic CHF (congestive heart failure) ([place]) . Hypothyroidism . Pleural effusion . Acidosis . Dysarthria   I have reviewed the medical record, interviewed the patient and family, and examined the patient. The following aspects are pertinent.  Past Medical History:  Diagnosis Date  . Anemia   . Arthritis   . CAD (coronary artery disease)    a. CABG '89; b. PCI '04; c. 11/2015 Cath/PCI: LM 20ost, LAD 15m, LCX 30p, OM2 40, RCA 30p, 10p ISR, 90d (3.0x12 Synergy DES), AM 90, VG->OM2 known to be 100, LIMA->LAD not injected, patent in 2015.  . Carotid stenosis, right   . Chronic combined systolic and diastolic CHF (congestive heart failure) ([place])   . CKD (chronic kidney disease), stage III ([place])   . Dyspnea   . H/O: GI bleed    REMOTE HISTORY  . History of COPD   . Hyperlipidemia   . Hypertensive heart disease   . LBBB (left bundle branch block)   . Pacemaker Sept 2009   St Jude  . PAF (paroxysmal atrial fibrillation) ([facility])    a. not on anticoag due to prior GIB.  . Sick sinus syndrome [facility]) Sept 2009   ST Jude PTVDP  . Stroke [facility])    Social History   Socioeconomic History  . Marital status: Married    Spouse name: Not on file  . Number of children: 5  . Years of education: Not on file  . Highest education level: Not on file  Occupational History  . Occupation: RETIRED    Employer: RETIRED    Comment: [employer]  . Financial resource strain: Not hard at all  . Food insecurity:    Worry: Never true    Inability: Never true  . Transportation needs:    Medical: No    Non-medical: No  Tobacco Use  . Smoking status: Former Smoker    Packs/day: 0.75    Years: 65.00    Pack years: 48.75    Types: Cigarettes    Last attempt to quit: 11/09/1997    Years since quitting: 21.0  . Smokeless tobacco: Former Systems developer    Quit date: 10/29/2008  Substance and Sexual Activity  . Alcohol use: No  . Drug use: No  . Sexual activity: Not Currently    Lifestyle  . Physical activity:    Days per week: 0 days    Minutes per session: 0 min  . Stress: Not on file  Relationships  . Social connections:    Talks on phone: Never    Gets together: More than three times a week    Attends religious service: Never    Active member of club or organization: Yes    Attends meetings of clubs or organizations: Never    Relationship status: Married  Other Topics Concern  . Not on file  Social History Narrative  . Not on file  Family History  Problem Relation Age of Onset  . Diabetes Father   . Stroke Sister   . Heart disease Sister   . Heart disease Brother   . Heart attack Brother   . Stroke Brother   . Hypertension Neg Hx    Scheduled Meds: . acetaminophen  650 mg Oral TID  . aspirin  300 mg Rectal Daily  . atorvastatin  40 mg Oral QHS  . bisacodyl  10 mg Rectal Daily  . clopidogrel  75 mg Oral Q breakfast  . cyanocobalamin  1,000 mcg Intramuscular Daily  . ezetimibe  10 mg Oral Daily  . feeding supplement (ENSURE ENLIVE)  237 mL Oral BID BM  . ferrous sulfate  325 mg Oral BID WC  . heparin  5,000 Units Subcutaneous Q8H  . hydrocortisone cream   Topical BID  . isosorbide mononitrate  90 mg Oral Daily  . latanoprost  1 drop Both Eyes Once per day on Mon Wed  . levothyroxine  60 mcg Intravenous Daily  . metoprolol tartrate  5 mg Intravenous Q8H  . mirtazapine  7.5 mg Oral QHS  . pantoprazole (PROTONIX) IV  40 mg Intravenous Q24H  . polyethylene glycol  17 g Oral Daily  . ramelteon  8 mg Oral QHS  . senna-docusate  1 tablet Oral QHS  . tamsulosin  0.4 mg Oral Daily   Continuous Infusions: . cefTAZidime (FORTAZ)  IV    . dextrose 5 % and 0.45% NaCl 75 mL/hr at 11/24/18 1333  . valproate sodium 250 mg (11/24/18 0546)   PRN Meds:.acetaminophen **OR** acetaminophen (TYLENOL) oral liquid 160 mg/5 mL **OR** acetaminophen, albuterol, diclofenac sodium, hydrOXYzine, nitroGLYCERIN, ondansetron **OR** ondansetron (ZOFRAN) IV,  phenylephrine, senna-docusate Medications Prior to Admission:  Prior to Admission medications   Medication Sig Start Date End Date Taking? Authorizing Provider  acetaminophen (TYLENOL) 325 MG tablet Take 650 mg by mouth 3 (three) times daily.   Yes [provider]  aspirin 81 MG tablet Take 81 mg by mouth daily.    Yes [provider]  atorvastatin (LIPITOR) 40 MG tablet Take 1 tablet (40 mg total) by mouth every evening. 09/01/18  Yes Aline August, MD  carvedilol (COREG) 12.5 MG tablet Take 1 tablet (12.5 mg total) by mouth 2 (two) times daily with a meal. 05/27/18  Yes Emokpae, Courage, MD  clopidogrel (PLAVIX) 75 MG tablet Take 1 tablet (75 mg total) by mouth daily. 05/27/18  Yes Emokpae, Courage, MD  desonide (DESOWEN) 0.05 % cream Apply 1 application topically 2 (two) times daily.   Yes [provider]  ezetimibe (ZETIA) 10 MG tablet Take 10 mg by mouth daily.   Yes [provider]  ferrous sulfate 325 (65 FE) MG tablet Take 1 tablet (325 mg total) by mouth 2 (two) times daily with a meal. 05/27/18  Yes Emokpae, Courage, MD  furosemide (LASIX) 20 MG tablet Take 1 tablet (20 mg total) by mouth 2 (two) times daily. 08/12/18  Yes Domenic Polite, MD  ibuprofen (ADVIL,MOTRIN) 200 MG tablet Take 800 mg by mouth every 8 (eight) hours.   Yes [provider]  ipratropium-albuterol (DUONEB) 0.5-2.5 (3) MG/3ML SOLN Take 3 mLs by nebulization 2 (two) times daily. 02/24/18  Yes Mikhail, Velta Addison, DO  isosorbide mononitrate (IMDUR) 30 MG 24 hr tablet Take 3 tablets (90 mg total) by mouth daily. 05/28/18  Yes Emokpae, Courage, MD  latanoprost (XALATAN) 0.005 % ophthalmic solution Place 1 drop into both eyes 2 (two) times a week.  On mondays and wednesdays 11/07/15  Yes [provider]  levothyroxine (SYNTHROID, LEVOTHROID) 125 MCG tablet TAKE 1 TABLET BY MOUTH EVERY DAY BEFORE BREAKFAST Patient taking differently: Take 125 mcg by mouth daily before breakfast.   04/05/18  Yes Troy Sine, MD  Melatonin 3 MG TABS Take 3 mg by mouth at bedtime.   Yes [provider]  pantoprazole (PROTONIX) 40 MG tablet Take 1 tablet (40 mg total) by mouth daily. 05/28/18  Yes Emokpae, Courage, MD  polyethylene glycol (MIRALAX / GLYCOLAX) packet Take 17 g by mouth daily. 05/27/18  Yes Emokpae, Courage, MD  ranolazine (RANEXA) 500 MG 12 hr tablet Take 1 tablet (500 mg total) by mouth 2 (two) times daily. 09/01/18  Yes Aline August, MD  shark liver oil-cocoa butter (PREPARATION H) 0.25-3-85.5 % suppository Place 1 suppository rectally as needed for hemorrhoids.   Yes [provider]  tamsulosin (FLOMAX) 0.4 MG CAPS capsule Take 1 capsule (0.4 mg total) by mouth daily. 05/27/18  Yes Emokpae, Courage, MD  albuterol (PROVENTIL HFA;VENTOLIN HFA) 108 (90 Base) MCG/ACT inhaler Inhale 2 puffs into the lungs every 4 (four) hours as needed for wheezing or shortness of breath (or coughing). 05/27/18   Roxan Hockey, MD  nitroGLYCERIN (NITROSTAT) 0.4 MG SL tablet Every 5 min x3 PRN chest pain 09/01/18   Aline August, MD   No Known Allergies Review of Systems  Unable to perform ROS: Mental status change    Physical Exam Constitutional:      Appearance: He is cachectic. He is ill-appearing.  HENT:     Head:     Comments: Audible throat secretions, patient too weal to clear with cough Cardiovascular:     Rate and Rhythm: Normal rate and regular rhythm.     Heart sounds: Normal heart sounds.  Pulmonary:     Breath sounds: Decreased air movement present.  Musculoskeletal:     Comments: Generalized weakness and muscle atrophy  Skin:    General: Skin is warm and dry.  Neurological:     Mental Status: He is lethargic and disoriented.     Vital Signs: BP (!) 164/75   Pulse 60   Temp 98.8 F (37.1 C) (Oral)   Resp 20   Ht 5\' 9"  (1.753 m)   Wt 71.7 kg   SpO2 92%   BMI 23.33 kg/m  Pain Scale: PAINAD POSS *See Group Information*: 1-Acceptable,Awake  and alert Pain Score: 0-No pain   SpO2: SpO2: 92 % O2 Device:SpO2: 92 % O2 Flow Rate: .O2 Flow Rate (L/min): 2 L/min  IO: Intake/output summary:   Intake/Output Summary (Last 24 hours) at 11/24/2018 1441 Last data filed at 11/24/2018 0545 Gross per 24 hour  Intake 52.5 ml  Output 650 ml  Net -597.5 ml    LBM: Last BM Date: 11/21/18 Baseline Weight: Weight: 67.1 kg Most recent weight: Weight: 71.7 kg     Palliative Assessment/Data: 30 % at best   Flowsheet Rows     Most Recent Value  Intake Tab  Referral Department  Hospitalist  Unit at Time of Referral  Med/Surg Unit  Date Notified  11/23/18  Palliative Care Type  Return patient Palliative Care  Reason for referral  Clarify Goals of Care  Date of Admission  11/15/18  # of days IP prior to Palliative referral  8  Clinical Assessment  Psychosocial & Spiritual Assessment  Palliative Care Outcomes     Discussed with Dr Florene Glen  Time In: 1415 Time Out: 1530 Time  Total: 75 minutes  Greater than 50%  of this time was spent counseling and coordinating care related to the above assessment and plan.  Signed by: [name], NP   Please contact Palliative Medicine Team phone at [phone] for questions and concerns.  For individual provider: See Shea Evans

## 2018-11-25 LAB — RENAL FUNCTION PANEL
ANION GAP: 12 (ref 5–15)
Albumin: 2.6 g/dL — ABNORMAL LOW (ref 3.5–5.0)
BUN: 44 mg/dL — ABNORMAL HIGH (ref 8–23)
CALCIUM: 8.6 mg/dL — AB (ref 8.9–10.3)
CO2: 26 mmol/L (ref 22–32)
Chloride: 105 mmol/L (ref 98–111)
Creatinine, Ser: 2.24 mg/dL — ABNORMAL HIGH (ref 0.61–1.24)
GFR calc Af Amer: 28 mL/min — ABNORMAL LOW (ref >60)
GFR calc non Af Amer: 25 mL/min — ABNORMAL LOW (ref >60)
Glucose, Bld: 105 mg/dL — ABNORMAL HIGH (ref 70–99)
Phosphorus: 2.5 mg/dL (ref 2.5–4.6)
Potassium: 3.8 mmol/L (ref 3.5–5.1)
SODIUM: 143 mmol/L (ref 135–145)

## 2018-11-25 LAB — CBC
HCT: 26.8 % — ABNORMAL LOW (ref 39.0–52.0)
Hemoglobin: 8.3 g/dL — ABNORMAL LOW (ref 13.0–17.0)
MCH: 30.2 pg (ref 26.0–34.0)
MCHC: 31 g/dL (ref 30.0–36.0)
MCV: 97.5 fL (ref 80.0–100.0)
Platelets: 147 10*3/uL — ABNORMAL LOW (ref 150–400)
RBC: 2.75 MIL/uL — ABNORMAL LOW (ref 4.22–5.81)
RDW: 16.6 % — ABNORMAL HIGH (ref 11.5–15.5)
WBC: 4.6 10*3/uL (ref 4.0–10.5)
nRBC: 0 % (ref 0.0–0.2)

## 2018-11-25 LAB — HEPATIC FUNCTION PANEL
ALT: 31 U/L (ref 0–44)
AST: 40 U/L (ref 15–41)
Albumin: 2.8 g/dL — ABNORMAL LOW (ref 3.5–5.0)
Alkaline Phosphatase: 42 U/L (ref 38–126)
Bilirubin, Direct: 0.1 mg/dL (ref 0.0–0.2)
Indirect Bilirubin: 0.7 mg/dL (ref 0.3–0.9)
Total Bilirubin: 0.8 mg/dL (ref 0.3–1.2)
Total Protein: 8 g/dL (ref 6.5–8.1)

## 2018-11-25 MED ORDER — CARVEDILOL 12.5 MG PO TABS: 12.5000 mg | ORAL_TABLET | Freq: Two times a day (BID) | ORAL | Status: DC

## 2018-11-25 MED ORDER — MIRTAZAPINE 15 MG PO TABS
15.0000 mg | ORAL_TABLET | Freq: Every day | ORAL | Status: DC
  Administered 2018-11-25 – 2018-11-28 (×4): 15 mg via ORAL
  Filled 2018-11-25 (×4): qty 1

## 2018-11-25 MED ORDER — CARVEDILOL 3.125 MG PO TABS: 3.1250 mg | ORAL_TABLET | Freq: Two times a day (BID) | ORAL | Status: DC

## 2018-11-25 MED ORDER — DEXTROSE-NACL 5-0.45 % IV SOLN
INTRAVENOUS | Status: AC
  Administered 2018-11-25: 14:00:00 via INTRAVENOUS

## 2018-11-25 MED ORDER — ASPIRIN EC 81 MG PO TBEC
81.0000 mg | DELAYED_RELEASE_TABLET | Freq: Every day | ORAL | Status: DC
  Administered 2018-11-27 – 2018-11-29 (×3): 81 mg via ORAL
  Filled 2018-11-25 (×4): qty 1

## 2018-11-25 MED ORDER — SODIUM CHLORIDE 0.9 % IV SOLN
2.0000 g | INTRAVENOUS | Status: AC
  Administered 2018-11-25 – 2018-11-26 (×2): 2 g via INTRAVENOUS
  Filled 2018-11-25 (×2): qty 2

## 2018-11-25 MED ORDER — PANTOPRAZOLE SODIUM 40 MG PO TBEC
40.0000 mg | DELAYED_RELEASE_TABLET | Freq: Every day | ORAL | Status: DC
  Administered 2018-11-27 – 2018-11-29 (×3): 40 mg via ORAL
  Filled 2018-11-25 (×4): qty 1

## 2018-11-25 MED ORDER — VALPROATE SODIUM 500 MG/5ML IV SOLN
500.0000 mg | Freq: Two times a day (BID) | INTRAVENOUS | Status: DC
  Administered 2018-11-25 – 2018-11-27 (×4): 500 mg via INTRAVENOUS
  Filled 2018-11-25 (×5): qty 5

## 2018-11-25 NOTE — Progress Notes (Signed)
RT called to administer PRN breathing treatment. Patient family declined need at the time of arrival. No distress noted.

## 2018-11-25 NOTE — Progress Notes (Signed)
PROGRESS NOTE    John Peck  JSE:[id number] DOB: Aug 16, 1924 DOA: 11/15/2018 PCP: John Carol, MD   Brief Narrative:  Per Dr. Wynetta Emery- John Peck a 85 y.o.malewith medical history significant ofchronic kidney disease, CHF, chronic anemia presented with right-sided inguinal hernia complaints. It is been hurting him over the past 2 weeks. Has been treated supportively at the nursing facility with Tylenol and ibuprofen. Was no inguinal hernia on my or ER clinicians physical exam. It was also not seen on abdominal pelvic CT. Patient denied any chest pain shortness of breath, orfever. He does follow a fluid restricted diet at the facility. He also eats a mechanical soft diet, andwas not eating very well so family has been giving him additional food to entice him to eat as well. Patient was unsure if he has had a urinary changes. Had a bowel movements morning but it was small. ED Course:Patient CT scan of abdomen and pelvis that did not show any inguinal hernia. Also did not show any stones or hydronephrosis. Patient does have a chronic right-sided pleural effusion that was present again today. He was found to have acute on chronic renal failure with a creatinine of 4.4 with prior being 1.8. He also had some white cells in his urine and has had previous UTI in the past.  Patient noted on 11/21/2018 to have dysarthric speech and code stroke has been activated.   Assessment & Plan:   Principal Problem:   Dysarthria Active Problems:   PAF (paroxysmal atrial fibrillation) (HCC)   CAD in native artery   Chronic systolic CHF (congestive heart failure) (HCC)   Hypothyroidism   Pleural effusion   Renal failure (ARF), acute on chronic (HCC)   Constipation   Pyuria   Dementia (HCC)   Acidosis   Acute on chronic systolic CHF (congestive heart failure) (HCC)   SOB (shortness of breath)   Adult failure to thrive   Palliative care by specialist   DNR (do not  resuscitate)   #1 dysarthric speech  myoclonic jerks  Toxic Metabolic Encephalopathy Patient noted to have dysarthric speech between 130p to 2 PM on 11/21/2018.  Patient noted to have incomprehensible speech over the previous 24-36 hours.  Patient also noted to have some myoclonic jerks.   Code stroke was initiated stat head CT negative for any acute abnormalities.   Patient unable to get a MRI due to pacemaker.   Patient was seen by tele neurology - recommending EEG, CTA, but family did not want to pursue due to renal function Echo with EF 55-60% (see report) Carotid dopplers with 80-99% stenosis in R ICA, 40-59% stenosis in L carotid EEG with area of potential epileptogenicity in R temporal region - neuro recommending continue depakote - dose increased per neurology 1/16 to 500 mg BID. CT head with old L basal ganglia lacunar infarct.  Low attenuation area within R internal capsule may represent small lacunar infarct, but reviewed by neurology and doesn't appear c/w new lacunar infarct Suspicion at this point is toxic/metabolic per neuro, recommending continue abx for UTI Appreciate neurology recs PT/OT/SLP Todays he's A&Ox3, still confused, but seems more interested in eating  Dysphagia: speech recommending dysphagia 3 diet, thin liquid, meds crushed with puree.   # Pseudomonas UTI - continue cefepime -> change to ceftaz with concern for cefepime and seizure threshold per pharmacy  # Concern for aspiration: pt with rattling in upper airway, concern for ongoing aspiration - improved today.  CXR ordered and notable for  bilateral effusions, small.  Speech recs as noted above.   2 acute on chronic renal failure stage III/acidosis Suspected to be related to diuretics and NSAID use Questionable etiology. Creatinine 4.46 on presentation, baseline is ~1.8 Improved today to 2.24 Hold lasix UA without protein, 0-5 RBC, CT without evidence of hydro Has been restarted on lasix Ranexa on  hold Continue to monitor  Hypernatremia: improved, continue gentle IVF  3.  Paroxysmal atrial fibrillation Continue Coreg for rate control (will decrease dose as HR low, stop metop).  Continue aspirin and Plavix.  Follow.  Will need to look into why not on anticoagulation if hx of afib?  4.  Acute on chronic diastolic heart failure  CAD Pt appeared volume overloaded on exam per previous provider and lasix restarted Hold lasix with above. Continue Coreg, aspirin, Plavix, Imdur, Lipitor.  5.  Hypothyroidism Continue home dose Synthroid.  6.  Constipation Likely etiology of patient's abdominal pain and concern for inguinal hernia.  CT abdomen and pelvis negative for hernia.  Patient with large bowel movements per RN.  Patient refusing Dulcolax suppositories and oral MiraLAX.  Patient with some complaints of abdominal discomfort on 11/19/2018 which is since improving.  Continue Dulcolax suppositories daily, MiraLAX, Senokot-S.  Will need a bowel regimen on discharge.   7.  Chronic pleural effusion Stable.  Monitor closely with volume overload.   Holding lasix for now with hypernatremia, follow with IVF as noted above.  8.  Dementia Stable.  10 chronic anemia Relatively stable, but low Labs suggestive of AOCD B12, folate normal (will stop IM B12 injections) Continue to monitor  11.  Probable neuropathy of feet Patient started on Neurontin 100 mg daily.  Discontinued Neurontin on 11/21/2018 due to patient's dysarthric speech and myoclonic jerks.  Tylenol as needed.  12.  Shortness of breath This has improved, previously 2/2 HF. Repeat CXR today with bilateral effusions. Concern for aspiration.   Continue to monitor closely.   Emotional Lability: daughter notes this happens when he's in the hospital.  Will increase remeron and see if this might help.  Goals of care: Had discussion with John Peck today.  Completed most.  Plan to treat treatable here, but at discharge,  will likely discharge with hospice (see most) and palliative care note (pending).   DVT prophylaxis: heparin Code Status: DNR Family Communication: daughters at bedside Disposition Plan: pending further improvement   Consultants:   Neurology  Procedures:  US Carotid Summary: Right Carotid: Velocities in the right ICA are consistent with a 80-99%                stenosis.  Left Carotid: Velocities in the left ICA are consistent with a 40-59% stenosis.  Vertebrals: Bilateral vertebral arteries demonstrate antegrade flow.  Echo Study Conclusions  - Left ventricle: Abnormal septal motion. The cavity size was   normal. Wall thickness was increased in a pattern of mild LVH.   Systolic function was normal. The estimated ejection fraction was   in the range of 55% to 60%. The study is not technically   sufficient to allow evaluation of LV diastolic function. - Aortic valve: There was trivial regurgitation. - Mitral valve: There was mild regurgitation. - Left atrium: The atrium was mildly dilated. - Right atrium: The atrium was mildly dilated. - Atrial septum: No defect or patent foramen ovale was identified. - Pulmonary arteries: PA peak pressure: 50 mm Hg (S).  Antimicrobials:  Anti-infectives (From admission, onward)   Start  Dose/Rate Route Frequency Ordered Stop   11/24/18 1800  cefTAZidime (FORTAZ) 1 g in sodium chloride 0.9 % 100 mL IVPB     1 g 200 mL/hr over 30 Minutes Intravenous  Once 11/24/18 0954 11/24/18 1810   11/18/18 1800  ceFEPIme (MAXIPIME) 2 g in sodium chloride 0.9 % 100 mL IVPB  Status:  Discontinued     2 g 200 mL/hr over 30 Minutes Intravenous Every 24 hours 11/18/18 1152 11/24/18 0954   11/17/18 1800  ceFEPIme (MAXIPIME) 1 g in sodium chloride 0.9 % 100 mL IVPB  Status:  Discontinued     1 g 200 mL/hr over 30 Minutes Intravenous Every 24 hours 11/17/18 1433 11/18/18 1152   11/17/18 0900  piperacillin-tazobactam (ZOSYN) IVPB 2.25 g  Status:   Discontinued     2.25 g 100 mL/hr over 30 Minutes Intravenous Every 8 hours 11/17/18 0817 11/17/18 1402   11/17/18 0815  piperacillin-tazobactam (ZOSYN) IVPB 3.375 g  Status:  Discontinued     3.375 g 12.5 mL/hr over 240 Minutes Intravenous Every 8 hours 11/17/18 0811 11/17/18 0817   11/15/18 2000  cefTRIAXone (ROCEPHIN) 1 g in sodium chloride 0.9 % 100 mL IVPB  Status:  Discontinued     1 g 200 mL/hr over 30 Minutes Intravenous Every 24 hours 11/15/18 1840 11/17/18 0811     Subjective: A bit more alert today A&Ox3 Asking to eat this morning per family  Objective: Vitals:   11/24/18 0540 11/24/18 2027 11/25/18 0440 11/25/18 1418  BP: (!) 164/75 (!) 175/77 (!) 182/74 (!) 144/92  Pulse: 60 (!) 59 60 (!) 55  Resp: 20 20 16 18   Temp: 98.8 F (37.1 C) 98.2 F (36.8 C) (!) 97.5 F (36.4 C) 98.4 F (36.9 C)  TempSrc: Oral     SpO2: 92% 97% 96% 99%  Weight: 71.7 kg     Height:        Intake/Output Summary (Last 24 hours) at 11/25/2018 1624 Last data filed at 11/25/2018 1200 Gross per 24 hour  Intake 344.17 ml  Output 1100 ml  Net -755.83 ml   Filed Weights   11/22/18 0534 11/23/18 0500 11/24/18 0540  Weight: 75.1 kg 73.5 kg 71.7 kg    Examination:  General: No acute distress. Cardiovascular: Heart sounds show a regular rate, and rhythm Lungs: Clear to auscultation bilaterally Abdomen: Soft, nontender, nondistended Neurological: Alert and oriented 3, but confused, emotionally labile. Moves all extremities 4 with equal strength. Cranial nerves II through XII grossly intact. Skin: Warm and dry. No rashes or lesions. Extremities: No clubbing or cyanosis. No edema.  Psychiatric: Mood and affect are depressed. Insight and judgment are impaired.   Data Reviewed: I have personally reviewed following labs and imaging studies  CBC: Recent Labs  Lab 11/21/18 0645 11/22/18 0822 11/23/18 0558 11/24/18 0533 11/25/18 0608  WBC 5.2 4.9 4.8 4.2 4.6  NEUTROABS  --   --  3.4   --   --   HGB 7.5* 7.3* 7.2* 8.1* 8.3*  HCT 24.0* 24.0* 24.0* 27.7* 26.8*  MCV 97.6 100.0 100.0 100.0 97.5  PLT 122* 126* 128* 136* 841*   Basic Metabolic Panel: Recent Labs  Lab 11/21/18 0645 11/22/18 0822 11/23/18 0558 11/24/18 0533 11/25/18 0608  NA 142 145 146* 149* 143  K 4.6 4.2 4.3 4.6 3.8  CL 104 109 107 112* 105  CO2 29 26 23 25 26   GLUCOSE 96 74 73 79 105*  BUN 62* 57* 56* 55* 44*  CREATININE 2.59*  2.55* 2.40* 2.53* 2.24*  CALCIUM 8.5* 8.5* 8.5* 8.8* 8.6*  MG 2.0  --   --  2.1  --   PHOS 3.2 4.0 3.8 3.4 2.5   GFR: Estimated Creatinine Clearance: 21 mL/min (A) (by C-G formula based on SCr of 2.24 mg/dL (H)). Liver Function Tests: Recent Labs  Lab 11/21/18 0645 11/22/18 4193 11/23/18 0558 11/24/18 0533 11/25/18 0608  AST  --   --   --  48* 40  ALT  --   --   --  35 31  ALKPHOS  --   --   --  41 42  BILITOT  --   --   --  0.9 0.8  PROT  --   --   --  7.2 8.0  ALBUMIN 2.5* 2.5* 2.3* 2.5*  2.6* 2.8*  2.6*   No results for input(s): LIPASE, AMYLASE in the last 168 hours. No results for input(s): AMMONIA in the last 168 hours. Coagulation Profile: No results for input(s): INR, PROTIME in the last 168 hours. Cardiac Enzymes: No results for input(s): CKTOTAL, CKMB, CKMBINDEX, TROPONINI in the last 168 hours. BNP (last 3 results) No results for input(s): PROBNP in the last 8760 hours. HbA1C: No results for input(s): HGBA1C in the last 72 hours. CBG: Recent Labs  Lab 11/21/18 1436  GLUCAP 105*   Lipid Profile: No results for input(s): CHOL, HDL, LDLCALC, TRIG, CHOLHDL, LDLDIRECT in the last 72 hours. Thyroid Function Tests: No results for input(s): TSH, T4TOTAL, FREET4, T3FREE, THYROIDAB in the last 72 hours. Anemia Panel: No results for input(s): VITAMINB12, FOLATE, FERRITIN, TIBC, IRON, RETICCTPCT in the last 72 hours. Sepsis Labs: No results for input(s): PROCALCITON, LATICACIDVEN in the last 168 hours.  Recent Results (from the past 240 hour(s))   Urine culture     Status: Abnormal   Collection Time: 11/15/18  6:26 PM  Result Value Ref Range Status   Specimen Description   Final    URINE, RANDOM Performed at [facility] [address], [place], [place] [address]    Special Requests   Final    NONE Performed at [facility], [place] [address], [place], [place] [address]    Culture >=100,000 COLONIES/mL PSEUDOMONAS AERUGINOSA (A)  Final   Report Status 11/18/2018 FINAL  Final   Organism ID, Bacteria PSEUDOMONAS AERUGINOSA (A)  Final      Susceptibility   Pseudomonas aeruginosa - MIC*    CEFTAZIDIME 4 SENSITIVE Sensitive     CIPROFLOXACIN <=0.25 SENSITIVE Sensitive     GENTAMICIN <=1 SENSITIVE Sensitive     IMIPENEM 2 SENSITIVE Sensitive     PIP/TAZO <=4 SENSITIVE Sensitive     CEFEPIME 4 SENSITIVE Sensitive     * >=100,000 COLONIES/mL PSEUDOMONAS AERUGINOSA  MRSA PCR Screening     Status: None   Collection Time: 11/15/18 11:15 PM  Result Value Ref Range Status   MRSA by PCR NEGATIVE NEGATIVE Final    Comment:        The GeneXpert MRSA Assay (FDA approved for NASAL specimens only), is one component of a comprehensive MRSA colonization surveillance program. It is not intended to diagnose MRSA infection nor to guide or monitor treatment for MRSA infections. Performed at [facility], [place] [address], [place], [place] [address]          Radiology Studies: Dg Chest 2 View  Result Date: 11/24/2018 CLINICAL DATA:  Chronic kidney disease and congestive heart failure. EXAM: CHEST - 2 VIEW  COMPARISON:  November 21, 2018 FINDINGS: The heart size and mediastinal contours are stable. The heart size is mildly enlarged. Cardiac pacemaker is unchanged compared prior exam. There are small bilateral pleural effusions. There is no pulmonary edema or focal pneumonia. The visualized skeletal structures are stable. IMPRESSION: Small bilateral pleural effusions. No pulmonary edema  or focal pneumonia. Electronically Signed   By: Abelardo Diesel M.D.   On: 11/24/2018 16:16        Scheduled Meds: . acetaminophen  650 mg Oral TID  . aspirin  300 mg Rectal Daily  . atorvastatin  40 mg Oral QHS  . bisacodyl  10 mg Rectal Daily  . clopidogrel  75 mg Oral Q breakfast  . cyanocobalamin  1,000 mcg Intramuscular Daily  . ezetimibe  10 mg Oral Daily  . feeding supplement (ENSURE ENLIVE)  237 mL Oral BID BM  . ferrous sulfate  325 mg Oral BID WC  . heparin  5,000 Units Subcutaneous Q8H  . hydrocortisone cream   Topical BID  . isosorbide mononitrate  90 mg Oral Daily  . latanoprost  1 drop Both Eyes Once per day on Mon Wed  . levothyroxine  60 mcg Intravenous Daily  . metoprolol tartrate  5 mg Intravenous Q8H  . mirtazapine  7.5 mg Oral QHS  . pantoprazole (PROTONIX) IV  40 mg Intravenous Q24H  . polyethylene glycol  17 g Oral Daily  . ramelteon  8 mg Oral QHS  . senna-docusate  1 tablet Oral QHS  . tamsulosin  0.4 mg Oral Daily   Continuous Infusions: . dextrose 5 % and 0.45% NaCl 50 mL/hr at 11/25/18 1340  . valproate sodium       LOS: 10 days    Time spent: over 30 min    Fayrene Helper, MD [facility] Pager see AMION  If 7PM-7AM, please contact night-coverage [email or web address] Password [facility] 11/25/2018, 4:24 PM

## 2018-11-25 NOTE — Progress Notes (Signed)
  Speech Language Pathology Treatment: Dysphagia  Patient Details Name: John Peck MRN: [id number] DOB: 1924-03-03 Today's Date: 11/25/2018 Time: 6440-3474 SLP Time Calculation (min) (ACUTE ONLY): 22 min  Assessment / Plan / Recommendation Clinical Impression  No indications of aspiration today even with pt being woken to participate in po trials.  He consumed graham cracker, applesauce, gingerale and water with hand over hand assist.  Mild delayed suspected with oral transiting and multiple swallows observed = ? Due to piecemealing and/or pharyngeal residuals.  Velna Hatchet granddaughter present and was informed to swallow precautions to mitigate aspiration using teach back, demonstration.    Of note, pt only wanted a few bites/sips before he stated he was "done".  Suspect his intake will not be adequate for nutrition but will still allow comfort.  Will follow up Monday 11/28/18 if pt is in hospital as daughters requested follow up yesterday.  Thanks for allowing me to help care for this pt.    HPI HPI: 85 year old male admitted 11/15/2018 with right inguinal hernia complaint. Patient noted on 11/21/2018 to have dysarthric speech and code stroke has been activated PMH: COPD, CVA, prior smoker, SSS, chronic kidney disease, CHF, chronic anemia.  CT 11/21/2018 = no acute hemorrhage      SLP Plan  Continue with current plan of care       Recommendations  Diet recommendations: Dysphagia 3 (mechanical soft);Thin liquid Liquids provided via: Cup;Straw;Teaspoon(modify based on how patient's performing) Medication Administration: Crushed with puree Supervision: Full supervision/cueing for compensatory strategies;Staff to assist with self feeding Compensations: Slow rate;Small sips/bites;Other (Comment)(oral suction if pt does not swallow) Postural Changes and/or Swallow Maneuvers: Seated upright 90 degrees;Upright 30-60 min after meal(fully upright)                Oral Care Recommendations: Oral  care QID Follow up Recommendations: (TBD) SLP Visit Diagnosis: Dysphagia, unspecified (R13.10) Plan: Continue with current plan of care       GO                Macario Golds 11/25/2018, 12:17 PM Luanna Salk, [facility] SLP Acute Rehab Services Pager [phone] Office [phone]

## 2018-11-25 NOTE — Progress Notes (Signed)
PT Cancellation Note  Patient Details Name: John Peck MRN: [id number] DOB: [birth year]   Cancelled Treatment:    Reason Eval/Treat Not Completed: Fatigue/lethargy limiting ability to participate(Upon PT arrival, pt sleeping soundly. Family asks PT not to wake pt, as pt has not been sleeping well and is still confused. Family requests PT try back on Monday. Will continue to follow acutely. )  [name], PT Acute Rehabilitation Services Pager [phone]  Office [phone]   [name] 11/25/2018, 11:46 AM

## 2018-11-25 NOTE — Care Management Note (Signed)
Case Management Note  Patient Details  Name: John Peck MRN: [id number] Date of Birth: 1923/11/14  Subjective/Objective:                  Return to top of Stroke: Ischemic RRG - [place]  Discharge readiness is indicated by patient meeting Recovery Milestones, including ALL of the following: ? Hemodynamic stability  yes ? Mental status at baseline or stable stable Neurologic deficits absent or stable/note from 011620=#1 dysarthric speech  myoclonic jerks  Toxic Metabolic Encephalopathy Patient noted to have dysarthric speech between 130p to 2 [facility] 11/21/2018. Patient noted to have incomprehensible speechover the previous 24-36 hours. Patient also noted to have some myoclonic jerks.  Code stroke was initiated stat head CT negative for any acute abnormalities.  Patient unable to get a MRI due to pacemaker.  Patient was seen by tele neurology - recommending EEG, CTA, but family did not want to pursue due to renal function Echo with EF 55-60% (see report) Carotid dopplers with 88-99% stenosis in R ICA, 40-59% stenosis in L carotid EEG with area of potential epileptogenicity in R temporal region - neuro recommending continue depakote - dose increased per neurology today. CT head with old L basal ganglia lacunar infarct.  Low attenuation area within R internal capsule may represent small lacunar infarct, but reviewed by neurology and doesn't appear c/w new lacunar infarct Suspicion at this point is toxic/metabolic per neuro, recommending continue abx for UTI Appreciate neurology recs PT/OT/SLP ? Today he's more alert, but still confused, not taking PO well ? Unimpaired swallowing no ? Tolerates sitting in chair no ? Dangerous arrhythmia absent yes ? Oral hydration, medications, and diet  ? Discharge plans and education understood ? Possible dc with hospice/ palliative care seeing patient   Action/Plan: Following for progression of care and condition. Following for cm needs none  present at this time.  Expected Discharge Date:  11/18/18               Expected Discharge Plan:     In-House Referral:     Discharge planning Services     Post Acute Care Choice:    Choice offered to:     DME Arranged:    DME Agency:     HH Arranged:    HH Agency:     Status of Service:     If discussed at [employer], dates discussed:    Additional Comments:  Leeroy Cha, RN 11/25/2018, 10:19 AM

## 2018-11-26 ENCOUNTER — Inpatient Hospital Stay (HOSPITAL_COMMUNITY): Payer: Medicare HMO

## 2018-11-26 LAB — COMPREHENSIVE METABOLIC PANEL
ALBUMIN: 2.4 g/dL — AB (ref 3.5–5.0)
ALT: 24 U/L (ref 0–44)
AST: 28 U/L (ref 15–41)
Alkaline Phosphatase: 39 U/L (ref 38–126)
Anion gap: 12 (ref 5–15)
BUN: 36 mg/dL — ABNORMAL HIGH (ref 8–23)
CO2: 26 mmol/L (ref 22–32)
Calcium: 8.4 mg/dL — ABNORMAL LOW (ref 8.9–10.3)
Chloride: 104 mmol/L (ref 98–111)
Creatinine, Ser: 2.02 mg/dL — ABNORMAL HIGH (ref 0.61–1.24)
GFR calc Af Amer: 32 mL/min — ABNORMAL LOW (ref >60)
GFR calc non Af Amer: 28 mL/min — ABNORMAL LOW (ref >60)
Glucose, Bld: 118 mg/dL — ABNORMAL HIGH (ref 70–99)
POTASSIUM: 3.9 mmol/L (ref 3.5–5.1)
Sodium: 142 mmol/L (ref 135–145)
Total Bilirubin: 0.6 mg/dL (ref 0.3–1.2)
Total Protein: 7.2 g/dL (ref 6.5–8.1)

## 2018-11-26 LAB — CBC
HCT: 28.2 % — ABNORMAL LOW (ref 39.0–52.0)
Hemoglobin: 8.6 g/dL — ABNORMAL LOW (ref 13.0–17.0)
MCH: 30.4 pg (ref 26.0–34.0)
MCHC: 30.5 g/dL (ref 30.0–36.0)
MCV: 99.6 fL (ref 80.0–100.0)
Platelets: 121 10*3/uL — ABNORMAL LOW (ref 150–400)
RBC: 2.83 MIL/uL — ABNORMAL LOW (ref 4.22–5.81)
RDW: 16.6 % — ABNORMAL HIGH (ref 11.5–15.5)
WBC: 4.3 10*3/uL (ref 4.0–10.5)
nRBC: 0 % (ref 0.0–0.2)

## 2018-11-26 LAB — RENAL FUNCTION PANEL
Albumin: 2.4 g/dL — ABNORMAL LOW (ref 3.5–5.0)
Anion gap: 12 (ref 5–15)
BUN: 37 mg/dL — ABNORMAL HIGH (ref 8–23)
CALCIUM: 8.3 mg/dL — AB (ref 8.9–10.3)
CO2: 25 mmol/L (ref 22–32)
Chloride: 105 mmol/L (ref 98–111)
Creatinine, Ser: 2.05 mg/dL — ABNORMAL HIGH (ref 0.61–1.24)
GFR calc Af Amer: 32 mL/min — ABNORMAL LOW (ref >60)
GFR calc non Af Amer: 27 mL/min — ABNORMAL LOW (ref >60)
Glucose, Bld: 117 mg/dL — ABNORMAL HIGH (ref 70–99)
Phosphorus: 1.8 mg/dL — ABNORMAL LOW (ref 2.5–4.6)
Potassium: 3.9 mmol/L (ref 3.5–5.1)
Sodium: 142 mmol/L (ref 135–145)

## 2018-11-26 LAB — MAGNESIUM: Magnesium: 2 mg/dL (ref 1.7–2.4)

## 2018-11-26 MED ORDER — METOPROLOL TARTRATE 5 MG/5ML IV SOLN: 5.0000 mg | Freq: Four times a day (QID) | INTRAVENOUS | Status: DC | PRN

## 2018-11-26 MED ORDER — CARVEDILOL 12.5 MG PO TABS
12.5000 mg | ORAL_TABLET | Freq: Two times a day (BID) | ORAL | Status: DC
  Administered 2018-11-26 – 2018-11-29 (×6): 12.5 mg via ORAL
  Filled 2018-11-26 (×6): qty 1

## 2018-11-26 MED ORDER — CARVEDILOL 6.25 MG PO TABS
6.2500 mg | ORAL_TABLET | ORAL | Status: DC
  Filled 2018-11-26: qty 1

## 2018-11-26 MED ORDER — DEXTROSE-NACL 5-0.45 % IV SOLN
INTRAVENOUS | Status: AC
  Administered 2018-11-26 – 2018-11-27 (×2): via INTRAVENOUS

## 2018-11-26 MED ORDER — METOPROLOL TARTRATE 5 MG/5ML IV SOLN
5.0000 mg | INTRAVENOUS | Status: AC | PRN
  Administered 2018-11-26 (×2): 5 mg via INTRAVENOUS
  Filled 2018-11-26: qty 5

## 2018-11-26 MED ORDER — METOPROLOL TARTRATE 5 MG/5ML IV SOLN
INTRAVENOUS | Status: AC
  Administered 2018-11-26: 5 mg via INTRAVENOUS
  Filled 2018-11-26: qty 10

## 2018-11-26 MED ORDER — CARVEDILOL 6.25 MG PO TABS
6.2500 mg | ORAL_TABLET | Freq: Two times a day (BID) | ORAL | Status: DC
  Administered 2018-11-26: 6.25 mg via ORAL
  Filled 2018-11-26: qty 1

## 2018-11-26 MED ORDER — LACTATED RINGERS IV BOLUS
500.0000 mL | Freq: Once | INTRAVENOUS | Status: AC
  Administered 2018-11-26: 500 mL via INTRAVENOUS

## 2018-11-26 NOTE — Progress Notes (Signed)
PROGRESS NOTE    John Peck  AYT:[id number] DOB: [birth year] DOA: 11/15/2018 PCP: John Carol, MD   Brief Narrative:  Per Dr. Wynetta Peck- John Peck 85 y.o.malewith medical history significant ofchronic kidney disease, CHF, chronic anemia presented with right-sided inguinal hernia complaints. It is been hurting him over the past 2 weeks. Has been treated supportively at the nursing facility with Tylenol and ibuprofen. Was no inguinal hernia on my or ER clinicians physical exam. It was also not seen on abdominal pelvic CT. Patient denied any chest pain shortness of breath, orfever. He does follow John Peck fluid restricted diet at the facility. He also eats John Peck mechanical soft diet, andwas not eating very well so family has been giving him additional food to entice him to eat as well. Patient was unsure if he has had John Peck urinary changes. Had John Peck bowel movements morning but it was small. ED Course:Patient CT scan of abdomen and pelvis that did not show any inguinal hernia. Also did not show any stones or hydronephrosis. Patient does have John Peck chronic right-sided pleural effusion that was present again today. He was found to have acute on chronic renal failure with Kayln Garceau creatinine of 4.4 with prior being 1.8. He also had some white cells in his urine and has had previous UTI in the past.  Patient noted on 11/21/2018 to have dysarthric speech and code stroke has been activated.   Assessment & Plan:   Principal Problem:   Dysarthria Active Problems:   PAF (paroxysmal atrial fibrillation) (HCC)   CAD in native artery   Chronic systolic CHF (congestive heart failure) (HCC)   Hypothyroidism   Pleural effusion   Renal failure (ARF), acute on chronic (HCC)   Constipation   Pyuria   Dementia (HCC)   Acidosis   Acute on chronic systolic CHF (congestive heart failure) (HCC)   SOB (shortness of breath)   Adult failure to thrive   Palliative care by specialist   DNR (do not  resuscitate)  # Tachycardia: EKG notable for tachycardia, p waves not clearly present with wide complex qrs of 122.  Prior EKG's with paced rhythm.  Discussed EKG with cardiology who noted could be flutter?  Recommended interrogation of pacemaker.  He's relatively asx with this (other than his delirium which is worse today). Coreg has been increased, prn IV metop Fluid bolus CXR Follow results of interrogation He's afebrile with stable WBC count  #1 dysarthric speech  myoclonic jerks  Toxic Metabolic Encephalopathy Patient noted to have dysarthric speech between 130p to 2 PM on 11/21/2018.  Patient noted to have incomprehensible speech over the previous 24-36 hours.  Patient also noted to have some myoclonic jerks.   Code stroke was initiated stat head CT negative for any acute abnormalities.   Patient unable to get John Peck MRI due to pacemaker.   Patient was seen by tele neurology - recommending EEG, CTA, but family did not want to pursue due to renal function Echo with EF 55-60% (see report) Carotid dopplers with 80-99% stenosis in R ICA, 40-59% stenosis in L carotid EEG with area of potential epileptogenicity in R temporal region - neuro recommending continue depakote - dose increased per neurology 1/16 to 500 mg BID. CT head with old L basal ganglia lacunar infarct.  Low attenuation area within R internal capsule may represent small lacunar infarct, but reviewed by neurology and doesn't appear c/w new lacunar infarct Suspicion at this point is toxic/metabolic per neuro, recommending continue abx for UTI Appreciate  neurology recs PT/OT/SLP Today he's John Peck bit more sleepy, confused.  Will continue to monitor.  Dysphagia: speech recommending dysphagia 3 diet, thin liquid, meds crushed with puree.   # Pseudomonas UTI - continue cefepime -> change to ceftaz with concern for cefepime and seizure threshold per pharmacy  # Concern for aspiration: pt with rattling in upper airway, concern for  ongoing aspiration - improved today.  CXR ordered and notable for bilateral effusions, small.  Speech recs as noted above.   2 acute on chronic renal failure stage III/acidosis Suspected to be related to diuretics and NSAID use Questionable etiology. Creatinine 4.46 on presentation, baseline is ~1.8 Improved today to 2.02 Hold lasix UA without protein, 0-5 RBC, CT without evidence of hydro Has been restarted on lasix Ranexa on hold Continue to monitor  Hypernatremia: improved, continue gentle IVF  3.  Paroxysmal atrial fibrillation Continue Coreg for rate control (will decrease dose as HR low, stop metop).  Continue aspirin and Plavix.  Follow.  Will need to look into why not on anticoagulation if hx of afib?  4.  Acute on chronic diastolic heart failure  CAD Pt appeared volume overloaded on exam per previous provider and lasix restarted Hold lasix with above. Continue Coreg, aspirin, Plavix, Imdur, Lipitor.  5.  Hypothyroidism Continue home dose Synthroid.  6.  Constipation Likely etiology of patient's abdominal pain and concern for inguinal hernia.  CT abdomen and pelvis negative for hernia.  Patient with large bowel movements per RN.  Patient refusing Dulcolax suppositories and oral MiraLAX.  Patient with some complaints of abdominal discomfort on 11/19/2018 which is since improving.  Continue Dulcolax suppositories daily, MiraLAX, Senokot-S.  Will need Arvil Utz bowel regimen on discharge.   7.  Chronic pleural effusion Stable.  Monitor closely with volume overload.   Holding lasix for now with hypernatremia, follow with IVF as noted above.  8.  Dementia Stable.  10 chronic anemia Relatively stable, but low Labs suggestive of AOCD B12, folate normal (will stop IM B12 injections) Continue to monitor  11.  Probable neuropathy of feet Patient started on Neurontin 100 mg daily.  Discontinued Neurontin on 11/21/2018 due to patient's dysarthric speech and myoclonic jerks.   Tylenol as needed.  12.  Shortness of breath This has improved, previously 2/2 HF. Repeat CXR today with bilateral effusions. Concern for aspiration.   Continue to monitor closely.   Emotional Lability: daughter notes this happens when he's in the hospital.  Will increase remeron and see if this might help.  Goals of care: Had discussion with [name].  Completed most.  Plan to treat treatable here, but at discharge, will likely discharge with hospice (see most) and palliative care note (pending).   DVT prophylaxis: heparin Code Status: DNR Family Communication: daughter ellen at bedside, [place] over phone Disposition Plan: pending further improvement   Consultants:   Neurology  Procedures:  US Carotid Summary: Right Carotid: Velocities in the right ICA are consistent with Chrissie Dacquisto 80-99%                stenosis.  Left Carotid: Velocities in the left ICA are consistent with Forestine Macho 40-59% stenosis.  Vertebrals: Bilateral vertebral arteries demonstrate antegrade flow.  Echo Study Conclusions  - Left ventricle: Abnormal septal motion. The cavity size was   normal. Wall thickness was increased in Helane Briceno pattern of mild LVH.   Systolic function was normal. The estimated ejection fraction was   in the range of 55% to 60%. The study is not  technically   sufficient to allow evaluation of LV diastolic function. - Aortic valve: There was trivial regurgitation. - Mitral valve: There was mild regurgitation. - Left atrium: The atrium was mildly dilated. - Right atrium: The atrium was mildly dilated. - Atrial septum: No defect or patent foramen ovale was identified. - Pulmonary arteries: PA peak pressure: 50 mm Hg (S).  Antimicrobials:  Anti-infectives (From admission, onward)   Start     Dose/Rate Route Frequency Ordered Stop   11/25/18 1700  cefTAZidime (FORTAZ) 2 g in sodium chloride 0.9 % 100 mL IVPB     2 g 200 mL/hr over 30 Minutes Intravenous Every 24 hours 11/25/18 1642 11/26/18  1655   11/24/18 1800  cefTAZidime (FORTAZ) 1 g in sodium chloride 0.9 % 100 mL IVPB     1 g 200 mL/hr over 30 Minutes Intravenous  Once 11/24/18 0954 11/24/18 1810   11/18/18 1800  ceFEPIme (MAXIPIME) 2 g in sodium chloride 0.9 % 100 mL IVPB  Status:  Discontinued     2 g 200 mL/hr over 30 Minutes Intravenous Every 24 hours 11/18/18 1152 11/24/18 0954   11/17/18 1800  ceFEPIme (MAXIPIME) 1 g in sodium chloride 0.9 % 100 mL IVPB  Status:  Discontinued     1 g 200 mL/hr over 30 Minutes Intravenous Every 24 hours 11/17/18 1433 11/18/18 1152   11/17/18 0900  piperacillin-tazobactam (ZOSYN) IVPB 2.25 g  Status:  Discontinued     2.25 g 100 mL/hr over 30 Minutes Intravenous Every 8 hours 11/17/18 0817 11/17/18 1402   11/17/18 0815  piperacillin-tazobactam (ZOSYN) IVPB 3.375 g  Status:  Discontinued     3.375 g 12.5 mL/hr over 240 Minutes Intravenous Every 8 hours 11/17/18 0811 11/17/18 0817   11/15/18 2000  cefTRIAXone (ROCEPHIN) 1 g in sodium chloride 0.9 % 100 mL IVPB  Status:  Discontinued     1 g 200 mL/hr over 30 Minutes Intravenous Every 24 hours 11/15/18 1840 11/17/18 0811     Subjective: More drowsy today Not eating as much Denying any pain.   Objective: Vitals:   11/26/18 0442 11/26/18 0621 11/26/18 1055 11/26/18 1354  BP:  (!) 158/106 (!) 142/100 (!) 144/98  Pulse:  (!) 114 (!) 116 (!) 117  Resp:  19    Temp:  98.4 F (36.9 C) 97.6 F (36.4 C) 98.1 F (36.7 C)  TempSrc:  Oral Oral   SpO2: 100% 100% 100% 100%  Weight:      Height:        Intake/Output Summary (Last 24 hours) at 11/26/2018 1733 Last data filed at 11/26/2018 0433 Gross per 24 hour  Intake 120 ml  Output 450 ml  Net -330 ml   Filed Weights   11/23/18 0500 11/24/18 0540 11/26/18 0351  Weight: 73.5 kg 71.7 kg 72.1 kg    Examination:  General: No acute distress. Cardiovascular: Heart sounds show Moni Rothrock regular tachy rate Lungs: Clear to auscultation bilaterally Abdomen: Soft, nontender,  nondistended Neurological: sleepy, confused. Moves all extremities 4.  Skin: Warm and dry. No rashes or lesions. Extremities: No clubbing or cyanosis. No edema.    Data Reviewed: I have personally reviewed following labs and imaging studies  CBC: Recent Labs  Lab 11/22/18 0822 11/23/18 0558 11/24/18 0533 11/25/18 0608 11/26/18 0532  WBC 4.9 4.8 4.2 4.6 4.3  NEUTROABS  --  3.4  --   --   --   HGB 7.3* 7.2* 8.1* 8.3* 8.6*  HCT 24.0* 24.0* 27.7* 26.8* 28.2*  MCV 100.0 100.0 100.0 97.5 99.6  PLT 126* 128* 136* 147* 035*   Basic Metabolic Panel: Recent Labs  Lab 11/21/18 0645 11/22/18 0822 11/23/18 0558 11/24/18 0533 11/25/18 0608 11/26/18 0532  NA 142 145 146* 149* 143 142  142  K 4.6 4.2 4.3 4.6 3.8 3.9  3.9  CL 104 109 107 112* 105 104  105  CO2 29 26 23 25 26 26  25   GLUCOSE 96 74 73 79 105* 118*  117*  BUN 62* 57* 56* 55* 44* 36*  37*  CREATININE 2.59* 2.55* 2.40* 2.53* 2.24* 2.02*  2.05*  CALCIUM 8.5* 8.5* 8.5* 8.8* 8.6* 8.4*  8.3*  MG 2.0  --   --  2.1  --  2.0  PHOS 3.2 4.0 3.8 3.4 2.5 1.8*   GFR: Estimated Creatinine Clearance: 23 mL/min (Jakeem Grape) (by C-G formula based on SCr of 2.05 mg/dL (H)). Liver Function Tests: Recent Labs  Lab 11/22/18 0822 11/23/18 0558 11/24/18 0533 11/25/18 0608 11/26/18 0532  AST  --   --  48* 40 28  ALT  --   --  35 31 24  ALKPHOS  --   --  41 42 39  BILITOT  --   --  0.9 0.8 0.6  PROT  --   --  7.2 8.0 7.2  ALBUMIN 2.5* 2.3* 2.5*  2.6* 2.8*  2.6* 2.4*  2.4*   No results for input(s): LIPASE, AMYLASE in the last 168 hours. No results for input(s): AMMONIA in the last 168 hours. Coagulation Profile: No results for input(s): INR, PROTIME in the last 168 hours. Cardiac Enzymes: No results for input(s): CKTOTAL, CKMB, CKMBINDEX, TROPONINI in the last 168 hours. BNP (last 3 results) No results for input(s): PROBNP in the last 8760 hours. HbA1C: No results for input(s): HGBA1C in the last 72 hours. CBG: Recent  Labs  Lab 11/21/18 1436  GLUCAP 105*   Lipid Profile: No results for input(s): CHOL, HDL, LDLCALC, TRIG, CHOLHDL, LDLDIRECT in the last 72 hours. Thyroid Function Tests: No results for input(s): TSH, T4TOTAL, FREET4, T3FREE, THYROIDAB in the last 72 hours. Anemia Panel: No results for input(s): VITAMINB12, FOLATE, FERRITIN, TIBC, IRON, RETICCTPCT in the last 72 hours. Sepsis Labs: No results for input(s): PROCALCITON, LATICACIDVEN in the last 168 hours.  No results found for this or any previous visit (from the past 240 hour(s)).       Radiology Studies: Dg Chest 2 View  Result Date: 11/26/2018 CLINICAL DATA:  Tachycardia EXAM: CHEST - 2 VIEW COMPARISON:  11/24/2018 FINDINGS: Cardiac shadow is enlarged but stable. Pacemaker is again seen and stable. Tortuous thoracic aorta is noted. Fullness in the right paratracheal region is again seen and stable consistent with tortuous vascularity. Small effusions are noted bilaterally right greater than left but stable from the prior exam. No focal infiltrate is noted. IMPRESSION: Effusions right greater than left No focal infiltrate is seen. Electronically Signed   By: Inez Catalina M.D.   On: 11/26/2018 17:14        Scheduled Meds: . acetaminophen  650 mg Oral TID  . aspirin EC  81 mg Oral Daily  . atorvastatin  40 mg Oral QHS  . bisacodyl  10 mg Rectal Daily  . carvedilol  12.5 mg Oral BID WC  . carvedilol  6.25 mg Oral NOW  . clopidogrel  75 mg Oral Q breakfast  . ezetimibe  10 mg Oral Daily  . feeding supplement (ENSURE ENLIVE)  237 mL Oral BID  BM  . ferrous sulfate  325 mg Oral BID WC  . heparin  5,000 Units Subcutaneous Q8H  . hydrocortisone cream   Topical BID  . isosorbide mononitrate  90 mg Oral Daily  . latanoprost  1 drop Both Eyes Once per day on Mon Wed  . levothyroxine  60 mcg Intravenous Daily  . mirtazapine  15 mg Oral QHS  . pantoprazole  40 mg Oral Daily  . polyethylene glycol  17 g Oral Daily  . ramelteon  8 mg  Oral QHS  . senna-docusate  1 tablet Oral QHS  . tamsulosin  0.4 mg Oral Daily   Continuous Infusions: . dextrose 5 % and 0.45% NaCl    . lactated ringers    . valproate sodium 500 mg (11/26/18 0805)     LOS: 11 days    Time spent: over 98 min    Fayrene Helper, MD [facility] Pager see AMION  If 7PM-7AM, please contact night-coverage [email or web address] Password [facility] 11/26/2018, 5:33 PM

## 2018-11-26 NOTE — Progress Notes (Signed)
MEWS Guidelines -Pt now has Yellow MEWS score , Amion text sent to oncall [facility] provider, will continue to monitor pt , await orders   Red - At High Risk for Deterioration Yellow - At risk for Deterioration  1. Go to room and assess patient 2. Validate data. Is this patient's baseline? If data confirmed: 3. Is this an acute change? 4. Administer prn meds/treatments as ordered. 5. Note Sepsis score 6. Review goals of care 7. Notify RRT nurse, Camera operator and Provider. 8. Ask Provider to come to bedside.  9. Document patient condition/interventions/response. 10. Increase frequency of vital signs and focused assessments to at least q15 minutes x 4, then q30 minutes x2. - If stable, then q1h x3, then q4h x3 and then q8h or dept. routine. - If unstable, contact Provider, RRT nurse and prepare for possible transfer. 11. Add entry in progress notes using the smart phrase ".MEWPilot". 1. Go to room and assess patient 2. Validate data. Is this patient's baseline? If data confirmed: 3. Is this an acute change? 4. Administer prn meds/treatments as ordered? 5. Note Sepsis score 6. Review goals of care 7. Call RRT nurse for initial change and as needed. 8. Sports coach and Provider 9. Document patient condition/interventions/response. 10. Increase frequency of vital signs and focused assessments to at least q2h x2. - If stable, then q4h x4 and then q8h or dept. routine. - If unstable, contact Provider and RRT nurse and prepare for possible transfer. 11. Add entry in progress notes using the smart phrase ".MEWPilot".  Green - Likely stable Lavender - Comfort Care Only  1. Continue routine/ordered monitoring.  2. Review goals of care. 1. Continue routine/ordered monitoring. 2. Review goals of care.

## 2018-11-27 LAB — CBC
HCT: 27.7 % — ABNORMAL LOW (ref 39.0–52.0)
Hemoglobin: 8.6 g/dL — ABNORMAL LOW (ref 13.0–17.0)
MCH: 29.9 pg (ref 26.0–34.0)
MCHC: 31 g/dL (ref 30.0–36.0)
MCV: 96.2 fL (ref 80.0–100.0)
PLATELETS: 141 10*3/uL — AB (ref 150–400)
RBC: 2.88 MIL/uL — ABNORMAL LOW (ref 4.22–5.81)
RDW: 16.5 % — ABNORMAL HIGH (ref 11.5–15.5)
WBC: 3.7 10*3/uL — ABNORMAL LOW (ref 4.0–10.5)
nRBC: 0 % (ref 0.0–0.2)

## 2018-11-27 LAB — COMPREHENSIVE METABOLIC PANEL
ALT: 17 U/L (ref 0–44)
ANION GAP: 8 (ref 5–15)
AST: 23 U/L (ref 15–41)
Albumin: 2.3 g/dL — ABNORMAL LOW (ref 3.5–5.0)
Alkaline Phosphatase: 41 U/L (ref 38–126)
BUN: 33 mg/dL — ABNORMAL HIGH (ref 8–23)
CO2: 28 mmol/L (ref 22–32)
Calcium: 8.3 mg/dL — ABNORMAL LOW (ref 8.9–10.3)
Chloride: 106 mmol/L (ref 98–111)
Creatinine, Ser: 1.86 mg/dL — ABNORMAL HIGH (ref 0.61–1.24)
GFR calc Af Amer: 36 mL/min — ABNORMAL LOW (ref >60)
GFR calc non Af Amer: 31 mL/min — ABNORMAL LOW (ref >60)
Glucose, Bld: 107 mg/dL — ABNORMAL HIGH (ref 70–99)
Potassium: 3.6 mmol/L (ref 3.5–5.1)
Sodium: 142 mmol/L (ref 135–145)
Total Bilirubin: 0.6 mg/dL (ref 0.3–1.2)
Total Protein: 7.1 g/dL (ref 6.5–8.1)

## 2018-11-27 LAB — MAGNESIUM: Magnesium: 1.8 mg/dL (ref 1.7–2.4)

## 2018-11-27 MED ORDER — VALPROIC ACID 250 MG PO CAPS
500.0000 mg | ORAL_CAPSULE | Freq: Two times a day (BID) | ORAL | Status: DC
  Administered 2018-11-27 – 2018-11-29 (×4): 500 mg via ORAL
  Filled 2018-11-27 (×4): qty 2

## 2018-11-27 MED ORDER — LEVOTHYROXINE SODIUM 125 MCG PO TABS
125.0000 ug | ORAL_TABLET | Freq: Every day | ORAL | Status: DC
  Administered 2018-11-28 – 2018-11-29 (×2): 125 ug via ORAL
  Filled 2018-11-27 (×2): qty 1

## 2018-11-27 NOTE — Progress Notes (Signed)
PROGRESS NOTE    John Peck  YPP:[id number] DOB: [birth year] DOA: 11/15/2018 PCP: Seward Carol, MD   Brief Narrative:  Per Dr. Wynetta Emery- John Peck John Peck 85 y.o.malewith medical history significant ofchronic kidney disease, CHF, chronic anemia presented with right-sided inguinal hernia complaints. It is been hurting him over the past 2 weeks. Has been treated supportively at the nursing facility with Tylenol and ibuprofen. Was no inguinal hernia on my or ER clinicians physical exam. It was also not seen on abdominal pelvic CT. Patient denied any chest pain shortness of breath, orfever. He does follow Draylon Mercadel fluid restricted diet at the facility. He also eats Jaimon Bugaj mechanical soft diet, andwas not eating very well so family has been giving him additional food to entice him to eat as well. Patient was unsure if he has had Tarynn Garling urinary changes. Had Lindsey Demonte bowel movements morning but it was small. ED Course:Patient CT scan of abdomen and pelvis that did not show any inguinal hernia. Also did not show any stones or hydronephrosis. Patient does have Maleny Candy chronic right-sided pleural effusion that was present again today. He was found to have acute on chronic renal failure with Keyera Hattabaugh creatinine of 4.4 with prior being 1.8. He also had some white cells in his urine and has had previous UTI in the past.  Patient noted on 11/21/2018 to have dysarthric speech and code stroke has been activated.   Assessment & Plan:   Principal Problem:   Dysarthria Active Problems:   PAF (paroxysmal atrial fibrillation) (HCC)   CAD in native artery   Chronic systolic CHF (congestive heart failure) (HCC)   Hypothyroidism   Pleural effusion   Renal failure (ARF), acute on chronic (HCC)   Constipation   Pyuria   Dementia (HCC)   Acidosis   Acute on chronic systolic CHF (congestive heart failure) (HCC)   SOB (shortness of breath)   Adult failure to thrive   Palliative care by specialist   DNR (do not  resuscitate)  # Atrial Flutter: EKG notable for tachycardia, p waves not clearly present with wide complex qrs of 122.  Prior EKG's with paced rhythm.  Discussed EKG with cardiology who noted could be flutter?  Recommended interrogation of pacemaker (consistent with flutter).   Improved today Continue coreg at 12.5 mg BID CXR - with small effusions, stable, no infiltrate Follow results of interrogation He's afebrile with stable WBC count  #1 dysarthric speech  myoclonic jerks  Toxic Metabolic Encephalopathy Patient noted to have dysarthric speech between 130p to 2 PM on 11/21/2018.  Patient noted to have incomprehensible speech over the previous 24-36 hours.  Patient also noted to have some myoclonic jerks.   Code stroke was initiated stat head CT negative for any acute abnormalities.   Patient unable to get Marlys Stegmaier MRI due to pacemaker.   Patient was seen by tele neurology - recommending EEG, CTA, but family did not want to pursue due to renal function Echo with EF 55-60% (see report) Carotid dopplers with 80-99% stenosis in R ICA, 40-59% stenosis in L carotid EEG with area of potential epileptogenicity in R temporal region - neuro recommending continue depakote - dose increased per neurology 1/16 to 500 mg BID. CT head with old L basal ganglia lacunar infarct.  Low attenuation area within R internal capsule may represent small lacunar infarct, but reviewed by neurology and doesn't appear c/w new lacunar infarct Suspicion at this point is toxic/metabolic per neuro, recommending continue abx for UTI Appreciate neurology recs  PT/OT/SLP He's Vonda Harth&Ox3, but still confused, continue to monitor.  Dysphagia: speech recommending dysphagia 3 diet, thin liquid, meds crushed with puree.   # Pseudomonas UTI - continue cefepime -> change to ceftaz with concern for cefepime and seizure threshold per pharmacy Abx course complete  # Concern for aspiration: pt with rattling in upper airway, concern for ongoing  aspiration - improved today.  CXR ordered and notable for bilateral effusions, small.  Speech recs as noted above.   2 acute on chronic renal failure stage III/acidosis Suspected to be related to diuretics and NSAID use Questionable etiology. Creatinine 4.46 on presentation, baseline is ~1.8 Improved today to 1.86 Hold lasix UA without protein, 0-5 RBC, CT without evidence of hydro Has been restarted on lasix Ranexa on hold Continue to monitor  Hypernatremia: improved, continue gentle IVF  3.  Paroxysmal atrial fibrillation Continue Coreg for rate control (will decrease dose as HR low, stop metop).  Continue aspirin and Plavix.  Follow.  Will need to look into why not on anticoagulation if hx of afib?  4.  Acute on chronic diastolic heart failure  CAD Pt appeared volume overloaded on exam per previous provider and lasix restarted Hold lasix with above. Continue Coreg, aspirin, Plavix, Imdur, Lipitor.  5.  Hypothyroidism Continue home dose Synthroid.  6.  Constipation Likely etiology of patient's abdominal pain and concern for inguinal hernia.  CT abdomen and pelvis negative for hernia.  Patient with large bowel movements per RN.  Patient refusing Dulcolax suppositories and oral MiraLAX.  Patient with some complaints of abdominal discomfort on 11/19/2018 which is since improving.  Continue Dulcolax suppositories daily, MiraLAX, Senokot-S.  Will need Tammara Massing bowel regimen on discharge.   7.  Chronic pleural effusion Stable.  Monitor closely with volume overload.   Holding lasix for now with hypernatremia, follow with IVF as noted above.  8.  Dementia Stable.  10 chronic anemia Relatively stable, but low Labs suggestive of AOCD B12, folate normal (will stop IM B12 injections) Continue to monitor  11.  Probable neuropathy of feet Patient started on Neurontin 100 mg daily.  Discontinued Neurontin on 11/21/2018 due to patient's dysarthric speech and myoclonic jerks.  Tylenol as  needed.  12.  Shortness of breath This has improved, previously 2/2 HF. Repeat CXR with bilateral effusions. Concern for aspiration.   Continue to monitor closely.   Emotional Lability: daughter notes this happens when he's in the hospital.  Will increase remeron and see if this might help.  Goals of care: Had discussion with [name].  Completed most.  Plan to treat treatable here, but at discharge, will likely discharge with hospice (see most) and palliative care note (pending).   DVT prophylaxis: heparin Code Status: DNR Family Communication: daughter ellen at bedside, [place] over phone Disposition Plan: pending further improvement   Consultants:   Neurology  Procedures:  US Carotid Summary: Right Carotid: Velocities in the right ICA are consistent with Abegail Kloeppel 80-99%                stenosis.  Left Carotid: Velocities in the left ICA are consistent with Arrie Zuercher 40-59% stenosis.  Vertebrals: Bilateral vertebral arteries demonstrate antegrade flow.  Echo Study Conclusions  - Left ventricle: Abnormal septal motion. The cavity size was   normal. Wall thickness was increased in Cheryel Kyte pattern of mild LVH.   Systolic function was normal. The estimated ejection fraction was   in the range of 55% to 60%. The study is not technically   sufficient  to allow evaluation of LV diastolic function. - Aortic valve: There was trivial regurgitation. - Mitral valve: There was mild regurgitation. - Left atrium: The atrium was mildly dilated. - Right atrium: The atrium was mildly dilated. - Atrial septum: No defect or patent foramen ovale was identified. - Pulmonary arteries: PA peak pressure: 50 mm Hg (S).  Antimicrobials:  Anti-infectives (From admission, onward)   Start     Dose/Rate Route Frequency Ordered Stop   11/25/18 1700  cefTAZidime (FORTAZ) 2 g in sodium chloride 0.9 % 100 mL IVPB     2 g 200 mL/hr over 30 Minutes Intravenous Every 24 hours 11/25/18 1642 11/26/18 1655   11/24/18  1800  cefTAZidime (FORTAZ) 1 g in sodium chloride 0.9 % 100 mL IVPB     1 g 200 mL/hr over 30 Minutes Intravenous  Once 11/24/18 0954 11/24/18 1810   11/18/18 1800  ceFEPIme (MAXIPIME) 2 g in sodium chloride 0.9 % 100 mL IVPB  Status:  Discontinued     2 g 200 mL/hr over 30 Minutes Intravenous Every 24 hours 11/18/18 1152 11/24/18 0954   11/17/18 1800  ceFEPIme (MAXIPIME) 1 g in sodium chloride 0.9 % 100 mL IVPB  Status:  Discontinued     1 g 200 mL/hr over 30 Minutes Intravenous Every 24 hours 11/17/18 1433 11/18/18 1152   11/17/18 0900  piperacillin-tazobactam (ZOSYN) IVPB 2.25 g  Status:  Discontinued     2.25 g 100 mL/hr over 30 Minutes Intravenous Every 8 hours 11/17/18 0817 11/17/18 1402   11/17/18 0815  piperacillin-tazobactam (ZOSYN) IVPB 3.375 g  Status:  Discontinued     3.375 g 12.5 mL/hr over 240 Minutes Intravenous Every 8 hours 11/17/18 0811 11/17/18 0817   11/15/18 2000  cefTRIAXone (ROCEPHIN) 1 g in sodium chloride 0.9 % 100 mL IVPB  Status:  Discontinued     1 g 200 mL/hr over 30 Minutes Intravenous Every 24 hours 11/15/18 1840 11/17/18 5621     Subjective: Abhimanyu Cruces&Ox3 Asking why he's here Discussed with daughter Dorian Pod at bedside.  Objective: Vitals:   11/27/18 0003 11/27/18 0451 11/27/18 0855 11/27/18 1502  BP: (!) 137/91 (!) 165/95 (!) 146/92 135/72  Pulse: (!) 114 (!) 118 (!) 131 60  Resp: 18 18 20    Temp: 97.7 F (36.5 C) 97.7 F (36.5 C) 97.8 F (36.6 C) 98.4 F (36.9 C)  TempSrc: Oral Oral Oral   SpO2: 97% 99% 100% 100%  Weight:  71.7 kg    Height:        Intake/Output Summary (Last 24 hours) at 11/27/2018 1518 Last data filed at 11/27/2018 1400 Gross per 24 hour  Intake 1600.75 ml  Output 1000 ml  Net 600.75 ml   Filed Weights   11/24/18 0540 11/26/18 0351 11/27/18 0451  Weight: 71.7 kg 72.1 kg 71.7 kg    Examination:  General: No acute distress. Cardiovascular: Heart sounds show Karmine Kauer regular rate, and rhythm Lungs: Clear to auscultation  bilaterally Abdomen: Soft, nontender, nondistended Neurological: Alert and oriented 3, but still Murphy Duzan bit confused, emotional. Moves all extremities 4. Cranial nerves II through XII grossly intact. Skin: Warm and dry. No rashes or lesions. Extremities: No clubbing or cyanosis. No edema.  Psychiatric: Mood and affect are normal. Insight and judgment are appropriate.   Data Reviewed: I have personally reviewed following labs and imaging studies  CBC: Recent Labs  Lab 11/23/18 0558 11/24/18 0533 11/25/18 0608 11/26/18 0532 11/27/18 0544  WBC 4.8 4.2 4.6 4.3 3.7*  NEUTROABS 3.4  --   --   --   --  HGB 7.2* 8.1* 8.3* 8.6* 8.6*  HCT 24.0* 27.7* 26.8* 28.2* 27.7*  MCV 100.0 100.0 97.5 99.6 96.2  PLT 128* 136* 147* 121* 263*   Basic Metabolic Panel: Recent Labs  Lab 11/21/18 0645 11/22/18 0822 11/23/18 0558 11/24/18 0533 11/25/18 0608 11/26/18 0532 11/27/18 0544  NA 142 145 146* 149* 143 142  142 142  K 4.6 4.2 4.3 4.6 3.8 3.9  3.9 3.6  CL 104 109 107 112* 105 104  105 106  CO2 29 26 23 25 26 26  25 28   GLUCOSE 96 74 73 79 105* 118*  117* 107*  BUN 62* 57* 56* 55* 44* 36*  37* 33*  CREATININE 2.59* 2.55* 2.40* 2.53* 2.24* 2.02*  2.05* 1.86*  CALCIUM 8.5* 8.5* 8.5* 8.8* 8.6* 8.4*  8.3* 8.3*  MG 2.0  --   --  2.1  --  2.0 1.8  PHOS 3.2 4.0 3.8 3.4 2.5 1.8*  --    GFR: Estimated Creatinine Clearance: 25.3 mL/min (Kalesha Irving) (by C-G formula based on SCr of 1.86 mg/dL (H)). Liver Function Tests: Recent Labs  Lab 11/23/18 0558 11/24/18 0533 11/25/18 0608 11/26/18 0532 11/27/18 0544  AST  --  48* 40 28 23  ALT  --  35 31 24 17   ALKPHOS  --  41 42 39 41  BILITOT  --  0.9 0.8 0.6 0.6  PROT  --  7.2 8.0 7.2 7.1  ALBUMIN 2.3* 2.5*  2.6* 2.8*  2.6* 2.4*  2.4* 2.3*   No results for input(s): LIPASE, AMYLASE in the last 168 hours. No results for input(s): AMMONIA in the last 168 hours. Coagulation Profile: No results for input(s): INR, PROTIME in the last 168  hours. Cardiac Enzymes: No results for input(s): CKTOTAL, CKMB, CKMBINDEX, TROPONINI in the last 168 hours. BNP (last 3 results) No results for input(s): PROBNP in the last 8760 hours. HbA1C: No results for input(s): HGBA1C in the last 72 hours. CBG: Recent Labs  Lab 11/21/18 1436  GLUCAP 105*   Lipid Profile: No results for input(s): CHOL, HDL, LDLCALC, TRIG, CHOLHDL, LDLDIRECT in the last 72 hours. Thyroid Function Tests: No results for input(s): TSH, T4TOTAL, FREET4, T3FREE, THYROIDAB in the last 72 hours. Anemia Panel: No results for input(s): VITAMINB12, FOLATE, FERRITIN, TIBC, IRON, RETICCTPCT in the last 72 hours. Sepsis Labs: No results for input(s): PROCALCITON, LATICACIDVEN in the last 168 hours.  No results found for this or any previous visit (from the past 240 hour(s)).       Radiology Studies: Dg Chest 2 View  Result Date: 11/26/2018 CLINICAL DATA:  Tachycardia EXAM: CHEST - 2 VIEW COMPARISON:  11/24/2018 FINDINGS: Cardiac shadow is enlarged but stable. Pacemaker is again seen and stable. Tortuous thoracic aorta is noted. Fullness in the right paratracheal region is again seen and stable consistent with tortuous vascularity. Small effusions are noted bilaterally right greater than left but stable from the prior exam. No focal infiltrate is noted. IMPRESSION: Effusions right greater than left No focal infiltrate is seen. Electronically Signed   By: Inez Catalina M.D.   On: 11/26/2018 17:14        Scheduled Meds: . acetaminophen  650 mg Oral TID  . aspirin EC  81 mg Oral Daily  . atorvastatin  40 mg Oral QHS  . bisacodyl  10 mg Rectal Daily  . carvedilol  12.5 mg Oral BID WC  . clopidogrel  75 mg Oral Q breakfast  . ezetimibe  10 mg Oral Daily  .  feeding supplement (ENSURE ENLIVE)  237 mL Oral BID BM  . ferrous sulfate  325 mg Oral BID WC  . heparin  5,000 Units Subcutaneous Q8H  . hydrocortisone cream   Topical BID  . isosorbide mononitrate  90 mg Oral  Daily  . latanoprost  1 drop Both Eyes Once per day on Mon Wed  . [START ON 11/28/2018] levothyroxine  125 mcg Oral Q0600  . mirtazapine  15 mg Oral QHS  . pantoprazole  40 mg Oral Daily  . polyethylene glycol  17 g Oral Daily  . ramelteon  8 mg Oral QHS  . senna-docusate  1 tablet Oral QHS  . tamsulosin  0.4 mg Oral Daily  . valproic acid  500 mg Oral BID   Continuous Infusions: . dextrose 5 % and 0.45% NaCl 50 mL/hr at 11/27/18 1428     LOS: 12 days    Time spent: over 30 min    Fayrene Helper, MD [facility] Pager see AMION  If 7PM-7AM, please contact night-coverage [email or web address] Password [facility] 11/27/2018, 3:18 PM

## 2018-11-27 NOTE — Progress Notes (Signed)
Pharmacy: IV to PO Depakote and synthroid changed from IV to [place], Pharm.D 11/27/2018 12:11 PM

## 2018-11-28 LAB — COMPREHENSIVE METABOLIC PANEL
ALBUMIN: 2.2 g/dL — AB (ref 3.5–5.0)
ALT: 15 U/L (ref 0–44)
ANION GAP: 9 (ref 5–15)
AST: 19 U/L (ref 15–41)
Alkaline Phosphatase: 40 U/L (ref 38–126)
BUN: 30 mg/dL — ABNORMAL HIGH (ref 8–23)
CO2: 26 mmol/L (ref 22–32)
Calcium: 8.1 mg/dL — ABNORMAL LOW (ref 8.9–10.3)
Chloride: 105 mmol/L (ref 98–111)
Creatinine, Ser: 1.99 mg/dL — ABNORMAL HIGH (ref 0.61–1.24)
GFR calc Af Amer: 33 mL/min — ABNORMAL LOW (ref >60)
GFR calc non Af Amer: 28 mL/min — ABNORMAL LOW (ref >60)
Glucose, Bld: 87 mg/dL (ref 70–99)
Potassium: 3.4 mmol/L — ABNORMAL LOW (ref 3.5–5.1)
Sodium: 140 mmol/L (ref 135–145)
Total Bilirubin: 0.6 mg/dL (ref 0.3–1.2)
Total Protein: 6.7 g/dL (ref 6.5–8.1)

## 2018-11-28 LAB — HEMOGLOBIN AND HEMATOCRIT, BLOOD
HCT: 26.9 % — ABNORMAL LOW (ref 39.0–52.0)
Hemoglobin: 8.2 g/dL — ABNORMAL LOW (ref 13.0–17.0)

## 2018-11-28 LAB — CBC
HCT: 24.8 % — ABNORMAL LOW (ref 39.0–52.0)
HEMOGLOBIN: 7.6 g/dL — AB (ref 13.0–17.0)
MCH: 30.3 pg (ref 26.0–34.0)
MCHC: 30.6 g/dL (ref 30.0–36.0)
MCV: 98.8 fL (ref 80.0–100.0)
Platelets: 128 10*3/uL — ABNORMAL LOW (ref 150–400)
RBC: 2.51 MIL/uL — ABNORMAL LOW (ref 4.22–5.81)
RDW: 16.4 % — ABNORMAL HIGH (ref 11.5–15.5)
WBC: 3.5 10*3/uL — ABNORMAL LOW (ref 4.0–10.5)
nRBC: 0 % (ref 0.0–0.2)

## 2018-11-28 LAB — MAGNESIUM: Magnesium: 1.9 mg/dL (ref 1.7–2.4)

## 2018-11-28 MED ORDER — GUAIFENESIN-DM 100-10 MG/5ML PO SYRP: 5.0000 mL | ORAL_SOLUTION | ORAL | Status: DC | PRN

## 2018-11-28 MED ORDER — POTASSIUM CHLORIDE CRYS ER 20 MEQ PO TBCR
40.0000 meq | EXTENDED_RELEASE_TABLET | Freq: Once | ORAL | Status: AC
  Administered 2018-11-28: 40 meq via ORAL
  Filled 2018-11-28: qty 2

## 2018-11-28 NOTE — Progress Notes (Signed)
PROGRESS NOTE    NORMAND DAMRON  EZM:[id number] DOB: 26-Jan-1924 DOA: 11/15/2018 PCP: Seward Carol, MD   Brief Narrative:  Per Dr. Wynetta Emery- Warnell Rasnic Spruillis Naome Brigandi 85 y.o.malewith medical history significant ofchronic kidney disease, CHF, chronic anemia presented with right-sided inguinal hernia complaints. It is been hurting him over the past 2 weeks. Has been treated supportively at the nursing facility with Tylenol and ibuprofen. Was no inguinal hernia on my or ER clinicians physical exam. It was also not seen on abdominal pelvic CT. Patient denied any chest pain shortness of breath, orfever. He does follow Afnan Cadiente fluid restricted diet at the facility. He also eats Ozell Juhasz mechanical soft diet, andwas not eating very well so family has been giving him additional food to entice him to eat as well. Patient was unsure if he has had Ephrata Verville urinary changes. Had Marielouise Amey bowel movements morning but it was small. ED Course:Patient CT scan of abdomen and pelvis that did not show any inguinal hernia. Also did not show any stones or hydronephrosis. Patient does have Jerardo Costabile chronic right-sided pleural effusion that was present again today. He was found to have acute on chronic renal failure with Donnie Gedeon creatinine of 4.4 with prior being 1.8. He also had some white cells in his urine and has had previous UTI in the past.  Patient noted on 11/21/2018 to have dysarthric speech and code stroke has been activated.   Assessment & Plan:   Principal Problem:   Dysarthria Active Problems:   PAF (paroxysmal atrial fibrillation) (HCC)   CAD in native artery   Chronic systolic CHF (congestive heart failure) (HCC)   Hypothyroidism   Pleural effusion   Renal failure (ARF), acute on chronic (HCC)   Constipation   Pyuria   Dementia (HCC)   Acidosis   Acute on chronic systolic CHF (congestive heart failure) (HCC)   SOB (shortness of breath)   Adult failure to thrive   Palliative care by specialist   DNR (do not  resuscitate)  # Atrial Flutter: EKG notable for tachycardia, p waves not clearly present with wide complex qrs of 122.  Prior EKG's with paced rhythm.  Discussed EKG with cardiology who noted could be flutter?  Recommended interrogation of pacemaker (consistent with flutter).   Improved today Continue coreg at 12.5 mg BID CXR - with small effusions, stable, no infiltrate He's afebrile with stable WBC count  #1 dysarthric speech  myoclonic jerks  Toxic Metabolic Encephalopathy Patient noted to have dysarthric speech between 130p to 2 PM on 11/21/2018.  Patient noted to have incomprehensible speech over the previous 24-36 hours.  Patient also noted to have some myoclonic jerks.   Code stroke was initiated stat head CT negative for any acute abnormalities.   Patient unable to get Kedar Sedano MRI due to pacemaker.   Patient was seen by tele neurology - recommending EEG, CTA, but family did not want to pursue due to renal function Echo with EF 55-60% (see report) Carotid dopplers with 80-99% stenosis in R ICA, 40-59% stenosis in L carotid EEG with area of potential epileptogenicity in R temporal region - neuro recommending continue depakote - dose increased per neurology 1/16 to 500 mg BID -> follow repeat level ~7 days after last dose change CT head with old L basal ganglia lacunar infarct.  Low attenuation area within R internal capsule may represent small lacunar infarct, but reviewed by neurology and doesn't appear c/w new lacunar infarct Suspicion at this point is toxic/metabolic per neuro, recommending continue  abx for UTI Appreciate neurology recs PT/OT/SLP He's Dalyn Kjos&Ox3, he's slowly improved during this hospitalization, but still has some delirium   Dysphagia: speech recommending dysphagia 3 diet, thin liquid, meds crushed with puree. Will continue IVF with poor PO intake for now until discharge.  # Pseudomonas UTI - continue cefepime -> change to ceftaz with concern for cefepime and seizure  threshold per pharmacy Abx course complete  # Concern for aspiration: pt with rattling in upper airway, concern for ongoing aspiration - improved today.  CXR ordered and notable for bilateral effusions, small.  Speech recs as noted above.   2 acute on chronic renal failure stage III/acidosis Suspected to be related to diuretics and NSAID use Questionable etiology. Creatinine 4.46 on presentation, baseline is ~1.8 Worsened to 1.99 today Hold lasix UA without protein, 0-5 RBC, CT without evidence of hydro Has been restarted on lasix Ranexa on hold Continue to monitor  Hypernatremia: improved, continue gentle IVF  3.  Paroxysmal atrial fibrillation Continue Coreg for rate control (will decrease dose as HR low, stop metop).  Continue aspirin and Plavix.  Follow.  (per previous note, not on anticoag due to hx GI bleed)  4.  Acute on chronic diastolic heart failure  CAD Pt appeared volume overloaded on exam per previous provider and lasix restarted Hold lasix with above. Continue Coreg, aspirin, Plavix, Imdur, Lipitor.  5.  Hypothyroidism Continue home dose Synthroid.  6.  Constipation Likely etiology of patient's abdominal pain and concern for inguinal hernia.  CT abdomen and pelvis negative for hernia.  Patient with large bowel movements per RN.  Patient refusing Dulcolax suppositories and oral MiraLAX.  Patient with some complaints of abdominal discomfort on 11/19/2018 which is since improving.  Continue Dulcolax suppositories daily, MiraLAX, Senokot-S.  Will need Makhari Dovidio bowel regimen on discharge.   7.  Chronic pleural effusion Stable.  Monitor closely with volume overload.   Holding lasix for now with hypernatremia, follow with IVF as noted above.  8.  Dementia Stable.  10 chronic anemia Relatively stable, but low Labs suggestive of AOCD B12, folate normal (will stop IM B12 injections) Continue to monitor  11.  Probable neuropathy of feet Patient started on Neurontin  100 mg daily.  Discontinued Neurontin on 11/21/2018 due to patient's dysarthric speech and myoclonic jerks.  Tylenol as needed.  12.  Shortness of breath This has improved, previously 2/2 HF. Repeat CXR with bilateral effusions. Concern for aspiration.   Continue to monitor closely.   Emotional Lability: daughter notes this happens when he's in the hospital.  Will increase remeron and see if this might help.  Goals of care: Had discussion with [name].  Completed most.  Plan to treat treatable here, but at discharge, will likely discharge with hospice (see most) and palliative care note (pending).   DVT prophylaxis: heparin Code Status: DNR Family Communication: daughter ellen at bedside, [place] over phone Disposition Plan: likely discharge within 24 hours   Consultants:   Neurology  Procedures:  US Carotid Summary: Right Carotid: Velocities in the right ICA are consistent with Joanathan Affeldt 80-99%                stenosis.  Left Carotid: Velocities in the left ICA are consistent with Genora Arp 40-59% stenosis.  Vertebrals: Bilateral vertebral arteries demonstrate antegrade flow.  Echo Study Conclusions  - Left ventricle: Abnormal septal motion. The cavity size was   normal. Wall thickness was increased in Aveline Daus pattern of mild LVH.   Systolic function was normal.  The estimated ejection fraction was   in the range of 55% to 60%. The study is not technically   sufficient to allow evaluation of LV diastolic function. - Aortic valve: There was trivial regurgitation. - Mitral valve: There was mild regurgitation. - Left atrium: The atrium was mildly dilated. - Right atrium: The atrium was mildly dilated. - Atrial septum: No defect or patent foramen ovale was identified. - Pulmonary arteries: PA peak pressure: 50 mm Hg (S).  Antimicrobials:  Anti-infectives (From admission, onward)   Start     Dose/Rate Route Frequency Ordered Stop   11/25/18 1700  cefTAZidime (FORTAZ) 2 g in sodium chloride  0.9 % 100 mL IVPB     2 g 200 mL/hr over 30 Minutes Intravenous Every 24 hours 11/25/18 1642 11/26/18 1655   11/24/18 1800  cefTAZidime (FORTAZ) 1 g in sodium chloride 0.9 % 100 mL IVPB     1 g 200 mL/hr over 30 Minutes Intravenous  Once 11/24/18 0954 11/24/18 1810   11/18/18 1800  ceFEPIme (MAXIPIME) 2 g in sodium chloride 0.9 % 100 mL IVPB  Status:  Discontinued     2 g 200 mL/hr over 30 Minutes Intravenous Every 24 hours 11/18/18 1152 11/24/18 0954   11/17/18 1800  ceFEPIme (MAXIPIME) 1 g in sodium chloride 0.9 % 100 mL IVPB  Status:  Discontinued     1 g 200 mL/hr over 30 Minutes Intravenous Every 24 hours 11/17/18 1433 11/18/18 1152   11/17/18 0900  piperacillin-tazobactam (ZOSYN) IVPB 2.25 g  Status:  Discontinued     2.25 g 100 mL/hr over 30 Minutes Intravenous Every 8 hours 11/17/18 0817 11/17/18 1402   11/17/18 0815  piperacillin-tazobactam (ZOSYN) IVPB 3.375 g  Status:  Discontinued     3.375 g 12.5 mL/hr over 240 Minutes Intravenous Every 8 hours 11/17/18 0811 11/17/18 0817   11/15/18 2000  cefTRIAXone (ROCEPHIN) 1 g in sodium chloride 0.9 % 100 mL IVPB  Status:  Discontinued     1 g 200 mL/hr over 30 Minutes Intravenous Every 24 hours 11/15/18 1840 11/17/18 0811     Subjective: Estefanny Moler&Ox3 Still confused, emotional Discussed with daughter at bedside  Objective: Vitals:   11/27/18 2050 11/27/18 2357 11/28/18 0450 11/28/18 1445  BP: (!) 144/72 (!) 148/73 (!) 151/72 (!) 158/72  Pulse: 65 61 (!) 59 65  Resp: 17 18 18 16   Temp: 98.5 F (36.9 C) 98.3 F (36.8 C) 98.1 F (36.7 C) 98.6 F (37 C)  TempSrc: Oral Oral Oral   SpO2: 95% 100% 99% 95%  Weight:   73 kg   Height:        Intake/Output Summary (Last 24 hours) at 11/28/2018 1746 Last data filed at 11/28/2018 1328 Gross per 24 hour  Intake 559.96 ml  Output 1400 ml  Net -840.04 ml   Filed Weights   11/26/18 0351 11/27/18 0451 11/28/18 0450  Weight: 72.1 kg 71.7 kg 73 kg    Examination:  General: No acute  distress. Cardiovascular: Heart sounds show Wandalene Abrams regular rate, and rhythm Lungs: Clear to auscultation bilaterally Abdomen: Soft, nontender, nondistended Neurological: Alert and oriented 3, but still confused. Moves all extremities 4. Cranial nerves II through XII grossly intact. Extremities: No clubbing or cyanosis. No edema.   Data Reviewed: I have personally reviewed following labs and imaging studies  CBC: Recent Labs  Lab 11/23/18 0558 11/24/18 0533 11/25/18 3267 11/26/18 0532 11/27/18 0544 11/28/18 0511 11/28/18 1338  WBC 4.8 4.2 4.6 4.3 3.7* 3.5*  --  NEUTROABS 3.4  --   --   --   --   --   --   HGB 7.2* 8.1* 8.3* 8.6* 8.6* 7.6* 8.2*  HCT 24.0* 27.7* 26.8* 28.2* 27.7* 24.8* 26.9*  MCV 100.0 100.0 97.5 99.6 96.2 98.8  --   PLT 128* 136* 147* 121* 141* 128*  --    Basic Metabolic Panel: Recent Labs  Lab 11/22/18 0822 11/23/18 0558 11/24/18 0533 11/25/18 0608 11/26/18 0532 11/27/18 0544 11/28/18 0511  NA 145 146* 149* 143 142  142 142 140  K 4.2 4.3 4.6 3.8 3.9  3.9 3.6 3.4*  CL 109 107 112* 105 104  105 106 105  CO2 26 23 25 26 26  25 28 26   GLUCOSE 74 73 79 105* 118*  117* 107* 87  BUN 57* 56* 55* 44* 36*  37* 33* 30*  CREATININE 2.55* 2.40* 2.53* 2.24* 2.02*  2.05* 1.86* 1.99*  CALCIUM 8.5* 8.5* 8.8* 8.6* 8.4*  8.3* 8.3* 8.1*  MG  --   --  2.1  --  2.0 1.8 1.9  PHOS 4.0 3.8 3.4 2.5 1.8*  --   --    GFR: Estimated Creatinine Clearance: 23.7 mL/min (Trevelle Mcgurn) (by C-G formula based on SCr of 1.99 mg/dL (H)). Liver Function Tests: Recent Labs  Lab 11/24/18 0533 11/25/18 0608 11/26/18 0532 11/27/18 0544 11/28/18 0511  AST 48* 40 28 23 19   ALT 35 31 24 17 15   ALKPHOS 41 42 39 41 40  BILITOT 0.9 0.8 0.6 0.6 0.6  PROT 7.2 8.0 7.2 7.1 6.7  ALBUMIN 2.5*  2.6* 2.8*  2.6* 2.4*  2.4* 2.3* 2.2*   No results for input(s): LIPASE, AMYLASE in the last 168 hours. No results for input(s): AMMONIA in the last 168 hours. Coagulation Profile: No results for  input(s): INR, PROTIME in the last 168 hours. Cardiac Enzymes: No results for input(s): CKTOTAL, CKMB, CKMBINDEX, TROPONINI in the last 168 hours. BNP (last 3 results) No results for input(s): PROBNP in the last 8760 hours. HbA1C: No results for input(s): HGBA1C in the last 72 hours. CBG: No results for input(s): GLUCAP in the last 168 hours. Lipid Profile: No results for input(s): CHOL, HDL, LDLCALC, TRIG, CHOLHDL, LDLDIRECT in the last 72 hours. Thyroid Function Tests: No results for input(s): TSH, T4TOTAL, FREET4, T3FREE, THYROIDAB in the last 72 hours. Anemia Panel: No results for input(s): VITAMINB12, FOLATE, FERRITIN, TIBC, IRON, RETICCTPCT in the last 72 hours. Sepsis Labs: No results for input(s): PROCALCITON, LATICACIDVEN in the last 168 hours.  No results found for this or any previous visit (from the past 240 hour(s)).       Radiology Studies: No results found.      Scheduled Meds: . acetaminophen  650 mg Oral TID  . aspirin EC  81 mg Oral Daily  . atorvastatin  40 mg Oral QHS  . bisacodyl  10 mg Rectal Daily  . carvedilol  12.5 mg Oral BID WC  . clopidogrel  75 mg Oral Q breakfast  . ezetimibe  10 mg Oral Daily  . feeding supplement (ENSURE ENLIVE)  237 mL Oral BID BM  . ferrous sulfate  325 mg Oral BID WC  . heparin  5,000 Units Subcutaneous Q8H  . hydrocortisone cream   Topical BID  . isosorbide mononitrate  90 mg Oral Daily  . latanoprost  1 drop Both Eyes Once per day on Mon Wed  . levothyroxine  125 mcg Oral Q0600  . mirtazapine  15  mg Oral QHS  . pantoprazole  40 mg Oral Daily  . polyethylene glycol  17 g Oral Daily  . ramelteon  8 mg Oral QHS  . senna-docusate  1 tablet Oral QHS  . tamsulosin  0.4 mg Oral Daily  . valproic acid  500 mg Oral BID   Continuous Infusions:    LOS: 13 days    Time spent: over 30 min    Fayrene Helper, MD [facility] Pager see AMION  If 7PM-7AM, please contact  night-coverage [email or web address] Password [facility] 11/28/2018, 5:46 PM

## 2018-11-28 NOTE — Progress Notes (Signed)
Physical Therapy Treatment Patient Details Name: John Peck MRN: [id number] DOB: 1923-12-11 Today's Date: 11/28/2018    History of Present Illness 85 y.o. male admitted to ED on 11/15/18 from [facility] for ARF, groin pain.  PMH includes CAD with multiple cardiac caths as well as CABG, HTN, CKD stage III, hypothyroidism, dementia, pleural effusion,  anemia, chronic heart failure, stroke, COPD, dyspnea, PAD/PVD, and HLD.    PT Comments    Pt with improved affect and cognition this session, consistently following one-step commands with multimodal cues. Pt also with little to no UE and LE jerking noted this session. Pt requiring mod to max assist for sitting balance this session, both statically and dynamically. PT to continue to follow acutely.    Follow Up Recommendations  SNF     Equipment Recommendations  None recommended by PT    Recommendations for Other Services       Precautions / Restrictions Precautions Precautions: Fall Restrictions Weight Bearing Restrictions: No    Mobility  Bed Mobility Overal bed mobility: Needs Assistance Bed Mobility: Supine to Sit;Sit to Supine     Supine to sit: Max assist;HOB elevated Sit to supine: Max assist;HOB elevated   General bed mobility comments: Max assist for supine<>sit for LE management, trunk elevation/lowering, scooting to and from EOB once sitting upright with use of bed pad, and orientation of trunk once sitting EOB. Pt sat EOB for ~5 minutes, requiring mod-max assist for upright seated posture with hip and trunk facilitation. Pt with tendency to lean R on elbow, pt encouraged to prop on bilateral UEs but only able to tolerate for a few seconds.   Transfers Overall transfer level: Needs assistance Equipment used: (NT - pt mod-max assist to sit EOB)                Ambulation/Gait                 Stairs             Wheelchair Mobility    Modified Rankin (Stroke Patients Only)        Balance Overall balance assessment: Needs assistance Sitting-balance support: Bilateral upper extremity supported Sitting balance-Leahy Scale: Poor Sitting balance - Comments: requires mod-max assist for sitting EOB Postural control: Right lateral lean;Posterior lean                                  Cognition Arousal/Alertness: Awake/alert Behavior During Therapy: WFL for tasks assessed/performed Overall Cognitive Status: Impaired/Different from baseline Area of Impairment: Following commands;Safety/judgement;Problem solving                       Following Commands: Follows one step commands with increased time;Follows one step commands consistently Safety/Judgement: Decreased awareness of deficits;Decreased awareness of safety   Problem Solving: Requires tactile cues;Requires verbal cues;Difficulty sequencing;Decreased initiation        Exercises General Exercises - Lower Extremity Ankle Circles/Pumps: AROM;Both;5 reps;Supine Heel Slides: AAROM;Both;10 reps;Supine    General Comments General comments (skin integrity, edema, etc.): Pt with decreased UE/LE jerking, none present at rest.       Pertinent Vitals/Pain Pain Assessment: Faces Faces Pain Scale: No hurt Pain Intervention(s): Monitored during session;Limited activity within patient's tolerance    Home Living                      Prior Function  PT Goals (current goals can now be found in the care plan section) Acute Rehab PT Goals Patient Stated Goal: get more steady, walk  PT Goal Formulation: With patient Time For Goal Achievement: 12/01/18 Potential to Achieve Goals: Good Progress towards PT goals: Progressing toward goals    Frequency    Min 2X/week      PT Plan Current plan remains appropriate    Co-evaluation              AM-PAC PT "6 Clicks" Mobility   Outcome Measure  Help needed turning from your back to your side while in a flat bed  without using bedrails?: A Lot Help needed moving from lying on your back to sitting on the side of a flat bed without using bedrails?: Total Help needed moving to and from a bed to a chair (including a wheelchair)?: Total Help needed standing up from a chair using your arms (e.g., wheelchair or bedside chair)?: Total Help needed to walk in hospital room?: Total Help needed climbing 3-5 steps with a railing? : Total 6 Click Score: 7    End of Session   Activity Tolerance: Patient limited by fatigue Patient left: in bed;with call bell/phone within reach;with family/visitor present;Other (comment)(positioning boots applied) Nurse Communication: Mobility status PT Visit Diagnosis: Other abnormalities of gait and mobility (R26.89);Muscle weakness (generalized) (M62.81)     Time: 0600-4599 PT Time Calculation (min) (ACUTE ONLY): 26 min  Charges:  $Therapeutic Activity: 8-22 mins $Neuromuscular Re-education: 8-22 mins                     Arella Blinder Conception Chancy, PT Acute Rehabilitation Services Pager [phone]  Office [phone]    Dezmon Conover D Jowell Bossi 11/28/2018, 1:31 PM

## 2018-11-29 DIAGNOSIS — G4089 Other seizures: Secondary | ICD-10-CM | POA: Diagnosis not present

## 2018-11-29 DIAGNOSIS — R1312 Dysphagia, oropharyngeal phase: Secondary | ICD-10-CM | POA: Diagnosis not present

## 2018-11-29 DIAGNOSIS — I5189 Other ill-defined heart diseases: Secondary | ICD-10-CM | POA: Diagnosis not present

## 2018-11-29 DIAGNOSIS — R2681 Unsteadiness on feet: Secondary | ICD-10-CM | POA: Diagnosis not present

## 2018-11-29 DIAGNOSIS — R471 Dysarthria and anarthria: Secondary | ICD-10-CM | POA: Diagnosis not present

## 2018-11-29 DIAGNOSIS — M6281 Muscle weakness (generalized): Secondary | ICD-10-CM | POA: Diagnosis not present

## 2018-11-29 DIAGNOSIS — R41841 Cognitive communication deficit: Secondary | ICD-10-CM | POA: Diagnosis not present

## 2018-11-29 DIAGNOSIS — R278 Other lack of coordination: Secondary | ICD-10-CM | POA: Diagnosis not present

## 2018-11-29 DIAGNOSIS — N178 Other acute kidney failure: Secondary | ICD-10-CM | POA: Diagnosis not present

## 2018-11-29 DIAGNOSIS — I480 Paroxysmal atrial fibrillation: Secondary | ICD-10-CM | POA: Diagnosis not present

## 2018-11-29 DIAGNOSIS — R2689 Other abnormalities of gait and mobility: Secondary | ICD-10-CM | POA: Diagnosis not present

## 2018-11-29 LAB — COMPREHENSIVE METABOLIC PANEL
ALT: 14 U/L (ref 0–44)
AST: 18 U/L (ref 15–41)
Albumin: 2.3 g/dL — ABNORMAL LOW (ref 3.5–5.0)
Alkaline Phosphatase: 43 U/L (ref 38–126)
Anion gap: 8 (ref 5–15)
BILIRUBIN TOTAL: 0.5 mg/dL (ref 0.3–1.2)
BUN: 30 mg/dL — ABNORMAL HIGH (ref 8–23)
CO2: 26 mmol/L (ref 22–32)
Calcium: 8.3 mg/dL — ABNORMAL LOW (ref 8.9–10.3)
Chloride: 105 mmol/L (ref 98–111)
Creatinine, Ser: 1.74 mg/dL — ABNORMAL HIGH (ref 0.61–1.24)
GFR calc Af Amer: 39 mL/min — ABNORMAL LOW (ref >60)
GFR calc non Af Amer: 33 mL/min — ABNORMAL LOW (ref >60)
Glucose, Bld: 95 mg/dL (ref 70–99)
POTASSIUM: 3.8 mmol/L (ref 3.5–5.1)
Sodium: 139 mmol/L (ref 135–145)
Total Protein: 7 g/dL (ref 6.5–8.1)

## 2018-11-29 LAB — CBC
HCT: 26.3 % — ABNORMAL LOW (ref 39.0–52.0)
Hemoglobin: 8 g/dL — ABNORMAL LOW (ref 13.0–17.0)
MCH: 29.6 pg (ref 26.0–34.0)
MCHC: 30.4 g/dL (ref 30.0–36.0)
MCV: 97.4 fL (ref 80.0–100.0)
Platelets: 152 10*3/uL (ref 150–400)
RBC: 2.7 MIL/uL — ABNORMAL LOW (ref 4.22–5.81)
RDW: 16.8 % — ABNORMAL HIGH (ref 11.5–15.5)
WBC: 4 10*3/uL (ref 4.0–10.5)
nRBC: 0 % (ref 0.0–0.2)

## 2018-11-29 LAB — MAGNESIUM: Magnesium: 1.9 mg/dL (ref 1.7–2.4)

## 2018-11-29 MED ORDER — DIVALPROEX SODIUM 125 MG PO CSDR: 500.0000 mg | DELAYED_RELEASE_CAPSULE | Freq: Two times a day (BID) | ORAL | 0 refills | Status: DC

## 2018-11-29 MED ORDER — ENSURE ENLIVE PO LIQD: 237.0000 mL | Freq: Two times a day (BID) | ORAL | 12 refills | Status: DC

## 2018-11-29 MED ORDER — MIRTAZAPINE 15 MG PO TABS: 15.0000 mg | ORAL_TABLET | Freq: Every day | ORAL | 0 refills | Status: DC

## 2018-11-29 MED ORDER — VALPROIC ACID 250 MG PO CAPS: 500.0000 mg | ORAL_CAPSULE | Freq: Two times a day (BID) | ORAL | 0 refills | Status: DC

## 2018-11-29 MED ORDER — DICLOFENAC SODIUM 1 % TD GEL: 2.0000 g | Freq: Four times a day (QID) | TRANSDERMAL | 0 refills | Status: AC | PRN

## 2018-11-29 NOTE — Discharge Summary (Signed)
Physician Discharge Summary  John Peck NFA:[id number] DOB: June 19, 1924 DOA: 11/15/2018  PCP: John Carol, MD  Admit date: 11/15/2018 Discharge date: 11/29/2018  Time spent: 40 minutes  Recommendations for Outpatient Follow-up:   1. Please ensure hospice follows patient as outpatient.  Hospice benefits should be activated. 2. Patient has MOST in the chart:  DNR, comfort measures, antibiotics only by mouth, No IVF or feeding tube (see completed MOST form).  Please follow up with hospice. 3. Please follow up depakote level around 1/24 to ensure therapeutic 4. Holding lasix at discharge, consider dosing as needed for swelling/hypervolemia  Discharge Diagnoses:  Principal Problem:   Dysarthria Active Problems:   PAF (paroxysmal atrial fibrillation) (HCC)   CAD in native artery   Chronic systolic CHF (congestive heart failure) (HCC)   Hypothyroidism   Pleural effusion   Renal failure (ARF), acute on chronic (HCC)   Constipation   Pyuria   Dementia (HCC)   Acidosis   Acute on chronic systolic CHF (congestive heart failure) (HCC)   SOB (shortness of breath)   Adult failure to thrive   Palliative care by specialist   DNR (do not resuscitate)   Discharge Condition: stable  Diet recommendation: per speech recommendations below  [facility] Weights   11/27/18 0451 11/28/18 0450 11/29/18 0501  Weight: 71.7 kg 73 kg 72.6 kg    History of present illness:  Per Dr. Wynetta Peck- John Peck John Peck 85 y.o.malewith medical history significant ofchronic kidney disease, CHF, chronic anemia presented with right-sided inguinal hernia complaints. It is been hurting him over the past 2 weeks. Has been treated supportively at the nursing facility with Tylenol and ibuprofen. Was no inguinal hernia on my or ER clinicians physical exam. It was also not seen on abdominal pelvic CT. Patient denied any chest pain shortness of breath, orfever. He does follow John Peck fluid restricted diet at the  facility. He also eats John Peck mechanical soft diet, andwas not eating very well so family has been giving him additional food to entice him to eat as well. Patient was unsure if he has had John Peck urinary changes. Had John Peck bowel movements morning but it was small. ED Course:Patient CT scan of abdomen and pelvis that did not show any inguinal hernia. Also did not show any stones or hydronephrosis. Patient does have John Peck chronic right-sided pleural effusion that was present again today. He was found to have acute on chronic renal failure with John Peck creatinine of 4.4 with prior being 1.8. He also had some white cells in his urine and has had previous UTI in the past.  Hospital course  He was admitted with AKI and John Peck pseudomonas aeruginosa urinary tract infection.  He was also treated for heart failure exacerbation.  He had an episode of dysarthric speech on 1/13 with concern for stroke.  MRI was unable to be obtained and head CT was not thought to be consistent with stroke per neurology.  Suspect that he may have had John Peck first time seizure.  Pt was started on depakote.  He's had confusion and poor po intake since that episode.  This seems to be slowly improving.  He was seen by palliative care and the plan is to invoke his hospice benefit at discharge (please see MOST form).  See below for additional details Hospital Course:  #1 dysarthric speech  myoclonic jerks  Toxic Metabolic Encephalopathy  Concern for Seizures Patient noted to have dysarthric speech between 130p to 2 John Peck 11/21/2018. Patient noted to have incomprehensible speechover  the previous 24-36 hours. Patient also noted to have some myoclonic jerks.  Code stroke was initiated stat head CT negative for any acute abnormalities.  Patient unable to get John Peck MRI due to pacemaker.  Patient was seen by tele neurology - recommending EEG, CTA, but family did not want to pursue due to renal function Echo with EF 55-60% (see report) Carotid dopplers with  80-99% stenosis in R ICA, 40-59% stenosis in L carotid (discussed with family, no further w/u as pt going home on hospice) EEG with area of potential epileptogenicity in R temporal region -  Neurology with suspicion that this may have been due to first time seizure - neuro recommending continue depakote - dose increased per neurology 1/16 to 500 mg BID -> follow repeat level on 1/24 CT head with old L basal ganglia lacunar infarct.  Low attenuation area within R internal capsule may represent small lacunar infarct, but reviewed by neurology and doesn't appear c/w new lacunar infarct Appreciate neurology recs PT/OT/SLP He's John Peck&Ox3, he's slowly improved during this hospitalization, but still has some delirium at times, continuing to improve  # Atrial Flutter: EKG notable for tachycardia, p waves not clearly present with wide complex qrs of 122.  Prior EKG's with paced rhythm.  Discussed EKG with cardiology who noted could be flutter?  Recommended interrogation of pacemaker (consistent with flutter).   Improved Continue coreg at 12.5 mg BID CXR - with small effusions, stable, no infiltrate He's afebrile with stable WBC count  Dysphagia: speech recommending dysphagia 3 diet, thin liquid, meds crushed with puree (see below).   # Pseudomonas UTI Abx course complete  # Concern for aspiration:  CXR ordered and notable for bilateral effusions, small.  Speech recs as noted above.   2 acute on chronic renal failure stage III/acidosis Suspected to be related to diuretics and NSAID use Questionable etiology. Creatinine 4.46 on presentation, baseline is ~1.8 Improved to 1.74 today Hold lasix at discharge UA without protein, 0-5 RBC, CT without evidence of hydro Has been restarted on lasix Ranexa on hold Continue to monitor  Hypernatremia: improved, continue gentle IVF  3. Paroxysmal atrial fibrillation Continue Coreg for rate control (will decrease dose as HR low, stop metop). Continue  aspirin and Plavix. Follow.  (per previous note, not on anticoag due to hx GI bleed)  4. Acute on chronic diastolic heart failure  CAD Pt appeared volume overloaded on exam per previous provider and lasix restarted Hold lasix with above. Continue Coreg, aspirin, Plavix, Imdur, Lipitor.  5. Hypothyroidism Continue home dose Synthroid.  6. Constipation Continue miralax  7. Chronic pleural effusion Stable. Monitor closely with volume overload.   8. Dementia Stable.  10 chronic anemia Relatively stable, but low Labs suggestive of AOCD B12, folate normal (will stop IM B12 injections) Continue to monitor  11. Probable neuropathy of feet Patient started on Neurontin 100 mg daily.Discontinued Neurontin on 11/21/2018 due to patient's dysarthric speech and myoclonic jerks. Tylenol as needed.  Consider trying again as outpatient  12. Shortness of breath This has improved, previously 2/2 HF. Repeat CXR with bilateral effusions. Concern for aspiration.   Continue to monitor closely.   Emotional Lability: daughter notes this happens when he's in the hospital.  Will increase remeron and see if this might help.  Goals of care:  Had discussion with [name].  Completed most.  DNR, comfort measures, antibiotics only by mouth, No IVF or feeding tube (see completed MOST form).  Hospice benefit to be invoked at discharge.  Please follow up outpatient with hospice.  Procedures: Carotid Dopplers Summary: Right Carotid: Velocities in the right ICA are consistent with Kaislee Chao 80-99%                stenosis.  Left Carotid: Velocities in the left ICA are consistent with Kamorie Aldous 40-59% stenosis.  Vertebrals: Bilateral vertebral arteries demonstrate antegrade flow.  Echo Study Conclusions  - Left ventricle: Abnormal septal motion. The cavity size was   normal. Wall thickness was increased in Amiyrah Lamere pattern of mild LVH.   Systolic function was normal. The estimated ejection  fraction was   in the range of 55% to 60%. The study is not technically   sufficient to allow evaluation of LV diastolic function. - Aortic valve: There was trivial regurgitation. - Mitral valve: There was mild regurgitation. - Left atrium: The atrium was mildly dilated. - Right atrium: The atrium was mildly dilated. - Atrial septum: No defect or patent foramen ovale was identified. - Pulmonary arteries: PA peak pressure: 50 mm Hg (S).   Consultations:  Neurology  Palliative care  Discharge Exam: Vitals:   11/29/18 0003 11/29/18 0501  BP: (!) 174/78 (!) 171/84  Pulse: 63 63  Resp: 18 19  Temp: 98.9 F (37.2 C) 98.5 F (36.9 C)  SpO2: 96% 99%   Pharoah Goggins&Ox3 C/o chronic leg pain, neuropathy? Family suspects No CP or SOB. Granddaughter at bedside.  General: No acute distress. Cardiovascular: Heart sounds show Sabree Nuon regular rate, and rhythm.  Lungs: Clear to auscultation bilaterally with good air movement. Abdomen: Soft, nontender, nondistended  Neurological: Alert and oriented 3. Moves all extremities 4. Cranial nerves II through XII grossly intact. Skin: Warm and dry. No rashes or lesions. Extremities: No clubbing or cyanosis. No edema.  Psychiatric: Mood and affect are normal. Insight and judgment are appropriate.  Discharge Instructions   Discharge Instructions    Diet - low sodium heart healthy   Complete by:  As directed    Discharge diet:   Complete by:  As directed    Diet recommendations: Dysphagia 3 (mechanical soft);Thin liquid Liquids provided via: Cup;Straw;Teaspoon(modify based on how patient's performing) Medication Administration: Crushed with puree Supervision: Full supervision/cueing for compensatory strategies;Staff to assist with self feeding Compensations: Slow rate;Small sips/bites;Other (Comment)(oral suction if pt does not swallow) Postural Changes and/or Swallow Maneuvers: Seated upright 90 degrees;Upright 30-60 min after meal(fully  upright)            Oral Care Recommendations: Oral care QID   Discharge instructions   Complete by:  As directed    You were seen for Tycen Dockter urinary tract infection.  You were treated with Larrell Rapozo 10 day course of antibiotics.  There was an episode of confusion during the hospitalization which may have been in the setting of Kurtis Anastasia seizure.   You've been started on depakote for seizures.  You should have Myrth Dahan level checked around 1/24 to see if this dose needs to be adjusted further.  We are discharging you with hospice.  The MOST has been completed and is in the chart.    Please ask your PCP to request records from this hospitalization so they know what was done and what the next steps will be.   Increase activity slowly   Complete by:  As directed    Increase activity slowly   Complete by:  As directed      Allergies as of 11/29/2018   No Known Allergies     Medication List    STOP taking these  medications   furosemide 20 MG tablet Commonly known as:  LASIX   ibuprofen 200 MG tablet Commonly known as:  ADVIL,MOTRIN   ranolazine 500 MG 12 hr tablet Commonly known as:  RANEXA     TAKE these medications   acetaminophen 325 MG tablet Commonly known as:  TYLENOL Take 650 mg by mouth 3 (three) times daily.   albuterol 108 (90 Base) MCG/ACT inhaler Commonly known as:  PROVENTIL HFA;VENTOLIN HFA Inhale 2 puffs into the lungs every 4 (four) hours as needed for wheezing or shortness of breath (or coughing).   aspirin 81 MG tablet Take 81 mg by mouth daily.   atorvastatin 40 MG tablet Commonly known as:  LIPITOR Take 1 tablet (40 mg total) by mouth every evening.   carvedilol 12.5 MG tablet Commonly known as:  COREG Take 1 tablet (12.5 mg total) by mouth 2 (two) times daily with Nelta Caudill meal.   clopidogrel 75 MG tablet Commonly known as:  PLAVIX Take 1 tablet (75 mg total) by mouth daily.   desonide 0.05 % cream Commonly known as:  DESOWEN Apply 1 application topically 2  (two) times daily.   diclofenac sodium 1 % Gel Commonly known as:  VOLTAREN Apply 2 g topically 4 (four) times daily as needed for up to 30 days (foot pain).   divalproex 125 MG capsule Commonly known as:  DEPAKOTE SPRINKLES Take 4 capsules (500 mg total) by mouth 2 (two) times daily for 30 days.   ezetimibe 10 MG tablet Commonly known as:  ZETIA Take 10 mg by mouth daily.   feeding supplement (ENSURE ENLIVE) Liqd Take 237 mLs by mouth 2 (two) times daily between meals.   ferrous sulfate 325 (65 FE) MG tablet Take 1 tablet (325 mg total) by mouth 2 (two) times daily with Chancelor Hardrick meal.   ipratropium-albuterol 0.5-2.5 (3) MG/3ML Soln Commonly known as:  DUONEB Take 3 mLs by nebulization 2 (two) times daily.   isosorbide mononitrate 30 MG 24 hr tablet Commonly known as:  IMDUR Take 3 tablets (90 mg total) by mouth daily.   latanoprost 0.005 % ophthalmic solution Commonly known as:  XALATAN Place 1 drop into both eyes 2 (two) times Zyshawn Bohnenkamp week. On mondays and wednesdays   levothyroxine 125 MCG tablet Commonly known as:  SYNTHROID, LEVOTHROID TAKE 1 TABLET BY MOUTH EVERY DAY BEFORE BREAKFAST What changed:  See the new instructions.   Melatonin 3 MG Tabs Take 3 mg by mouth at bedtime.   mirtazapine 15 MG tablet Commonly known as:  REMERON Take 1 tablet (15 mg total) by mouth at bedtime for 30 days.   nitroGLYCERIN 0.4 MG SL tablet Commonly known as:  NITROSTAT Every 5 min x3 PRN chest pain   pantoprazole 40 MG tablet Commonly known as:  PROTONIX Take 1 tablet (40 mg total) by mouth daily.   polyethylene glycol packet Commonly known as:  MIRALAX / GLYCOLAX Take 17 g by mouth daily.   shark liver oil-cocoa butter 0.25-3-85.5 % suppository Commonly known as:  PREPARATION H Place 1 suppository rectally as needed for hemorrhoids.   tamsulosin 0.4 MG Caps capsule Commonly known as:  FLOMAX Take 1 capsule (0.4 mg total) by mouth daily.      No Known Allergies    The  results of significant diagnostics from this hospitalization (including imaging, microbiology, ancillary and laboratory) are listed below for reference.    Significant Diagnostic Studies: Ct Abdomen Pelvis Wo Contrast  Result Date: 11/15/2018 CLINICAL DATA:  85 year old male  with right-sided groin pain for 2 days. EXAM: CT ABDOMEN AND PELVIS WITHOUT CONTRAST TECHNIQUE: Multidetector CT imaging of the abdomen and pelvis was performed following the standard protocol without IV contrast. COMPARISON:  08/01/2018 CT.  11/04/2018 renal sonogram. FINDINGS: Lower chest: Moderately large right-sided pleural effusion with adjacent compressive atelectasis. Pleural fluid may be partially loculated. Minimal scarring/basilar atelectasis left base. Mild cardiomegaly with pacemaker in place. Hepatobiliary: Taking into account limitation by non contrast imaging, no worrisome hepatic lesion. Scattered small calcifications. 6 mm calcified gallstone. No CT evidence of gallbladder inflammation. Pancreas: Taking into account limitation by non contrast imaging, no worrisome pancreatic mass or inflammation. Spleen: Taking into account limitation by non contrast imaging, no splenic mass or enlargement. Adrenals/Urinary Tract: Mild adrenal gland hyperplasia. Slightly atrophic right kidney. No obstructing stone or hydronephrosis. Right renal cyst. Taking into account limitation by non contrast imaging, no worrisome adrenal lesion. Appearance of prior TURP. Bladder otherwise unremarkable. Stomach/Bowel: Prominent diverticulosis and muscular hypertrophy descending colon and sigmoid colon without extraluminal inflammation. Moderate stool throughout remainder of the colon. Appendix not visualized. Under distended stomach. No gross abnormality noted. No obvious small bowel abnormality. Vascular/Lymphatic: Prominent atherosclerotic changes aorta and aortic branch vessels. Lower abdominal aortic aneurysm with maximal transverse dimension of 3.5  cm without significant change. Scattered normal size lymph nodes. Reproductive: Prior TURP for prostatectomy. Other: No free air or bowel containing hernia. No right inguinal canal hernia. Mild third spacing of fluid.  No clear sacral decubitus. Musculoskeletal: Fusion sacroiliac joints. Partial fusion lumbar spine and lower thoracic spine. IMPRESSION: 1. No right inguinal canal hernia detected as cause of patient's right groin pain. Stool filled cecum extends towards the superior margin of the inguinal canal but does not enter such. 2. Moderately large right-sided pleural effusion with adjacent compressive atelectasis. Pleural fluid may be partially loculated. Minimal scarring/basilar atelectasis left base. 3. Mild cardiomegaly with pacemaker in place. 4. Diverticulosis with prominent muscular hypertrophy descending colon and sigmoid colon. Moderate stool throughout remainder of colon. 5. 6 mm calcified gallstone without CT evidence of gallbladder inflammation. 6. Aortic Atherosclerosis (ICD10-I70.0). Lower abdominal aortic aneurysm with maximal transverse dimension of 3.5 cm. Recommend followup by ultrasound in 2 years. This recommendation follows ACR consensus guidelines: White Paper of the ACR Incidental Findings Committee II on Vascular Findings. J Am Coll Radiol 2013; 10:789-794. 7. Fusion sacroiliac joints. Partial fusion lumbar spine lower thoracic spine. Electronically Signed   By: John Del M.D.   On: 11/15/2018 18:04   Dg Chest 2 View  Result Date: 11/26/2018 CLINICAL DATA:  Tachycardia EXAM: CHEST - 2 VIEW COMPARISON:  11/24/2018 FINDINGS: Cardiac shadow is enlarged but stable. Pacemaker is again seen and stable. Tortuous thoracic aorta is noted. Fullness in the right paratracheal region is again seen and stable consistent with tortuous vascularity. Small effusions are noted bilaterally right greater than left but stable from the prior exam. No focal infiltrate is noted. IMPRESSION: Effusions  right greater than left No focal infiltrate is seen. Electronically Signed   By: Inez Catalina M.D.   On: 11/26/2018 17:14   Dg Chest 2 View  Result Date: 11/24/2018 CLINICAL DATA:  Chronic kidney disease and congestive heart failure. EXAM: CHEST - 2 VIEW COMPARISON:  November 21, 2018 FINDINGS: The heart size and mediastinal contours are stable. The heart size is mildly enlarged. Cardiac pacemaker is unchanged compared prior exam. There are small bilateral pleural effusions. There is no pulmonary edema or focal pneumonia. The visualized skeletal structures are stable. IMPRESSION: Small  bilateral pleural effusions. No pulmonary edema or focal pneumonia. Electronically Signed   By: Abelardo Diesel M.D.   On: 11/24/2018 16:16   Dg Chest 2 View  Result Date: 11/21/2018 CLINICAL DATA:  Hypertension EXAM: CHEST - 2 VIEW COMPARISON:  11/20/2018 FINDINGS: Cardiac shadow is prominent but stable. Pacing device and postsurgical changes are again seen. Tortuosity of the thoracic aorta is noted. Stable bilateral pleural effusions are noted right greater than left. The degree of vascular congestion is stable from the previous exam. No new focal abnormality is noted. IMPRESSION: Stable changes when compared with the prior study. Electronically Signed   By: Inez Catalina M.D.   On: 11/21/2018 15:43   Ct Head Wo Contrast  Result Date: 11/22/2018 CLINICAL DATA:  Difficulty with speech EXAM: CT HEAD WITHOUT CONTRAST TECHNIQUE: Contiguous axial images were obtained from the base of the skull through the vertex without intravenous contrast. COMPARISON:  CT brain scan of 11/21/2018 FINDINGS: Brain: The ventricular system remains dilated, and there are prominent cortical sulci, consistent with diffuse atrophy. The septum remains midline in position. The fourth ventricle and basilar cisterns are unremarkable. Moderate small vessel ischemic changes again noted throughout the periventricular white matter. There is more prominence of  Manuel Lawhead low-attenuation area within the internal capsule on the right consistent with lacunar infarction. No hemorrhage is seen. And old left lacunar infarct is noted in the basal ganglia. Vascular: No vascular abnormality is seen on this unenhanced study. Skull: On bone window images, no calvarial abnormality is seen. Sinuses/Orbits: There is mild ethmoid sinus disease which appears unchanged compared to the prior CT. Other: None none. IMPRESSION: 1. Slightly more prominent small low-attenuation area within the right internal capsule may represent Britt Theard small lacunar infarct. No hemorrhage. 2. Diffuse changes of atrophy and moderate small vessel ischemic change. Small old left basal ganglial lacunar infarct is stable. Electronically Signed   By: Ivar Drape M.D.   On: 11/22/2018 15:41   US Renal  Result Date: 11/04/2018 CLINICAL DATA:  85 year old male with right renal neoplasm and chronic kidney disease, stage III EXAM: RENAL / URINARY TRACT ULTRASOUND COMPLETE COMPARISON:  Prior CT scan of the abdomen and pelvis 08/01/2018 FINDINGS: Right Kidney: Renal measurements: 7.2 x 4.6 x 3.9 cm = volume: 68 mL. Mildly echogenic renal parenchyma with increased conspicuity of the corticomedullary interface. Anechoic cystic structure with posterior acoustic enhancement and imperceptible wall in the lower pole measures 2.4 x 1.7 x 1.8 cm. Keyshawn Hellwig second anechoic cystic structure with posterior acoustic enhancement and imperceptible wall more inferiorly within the right lower pole measures 1.7 x 1.2 x 1.4 cm. Left Kidney: Renal measurements: 9.0 x 5.4 x 5.1 cm = volume: 130 mL. Mildly echogenic renal parenchyma with increased conspicuity of the corticomedullary interface. Bladder: Appears normal for degree of bladder distention. No solid or cystic masses. No evidence of hydronephrosis. IMPRESSION: 1. Right lower pole simple renal cysts. 2. No evidence of hydronephrosis or nephrolithiasis. 3. Renal size discrepancy, the right kidney is  smaller than the left likely secondary to chronic ischemia related to renal artery stenosis. 4. Echogenic renal parenchyma bilaterally consistent with underlying medical renal disease. Electronically Signed   By: Jacqulynn Cadet M.D.   On: 11/04/2018 15:26   Dg Chest Port 1 View  Result Date: 11/20/2018 CLINICAL DATA:  Acute shortness of breath and generalized weakness. EXAM: PORTABLE CHEST 1 VIEW COMPARISON:  08/30/2018 and earlier. FINDINGS: Sternotomy for CABG. Cardiac silhouette moderately enlarged, unchanged. LEFT subclavian dual lead transvenous pacemaker unchanged  and appears intact. Mild diffuse interstitial pulmonary edema superimposed upon baseline changes of emphysema. Chronic or recurrent BILATERAL pleural effusions, RIGHT greater than LEFT, with associated passive atelectasis in the RIGHT LOWER LOBE. IMPRESSION: Mild acute CHF, with stable moderate cardiomegaly and mild diffuse interstitial pulmonary edema. Chronic or recurrent BILATERAL pleural effusions, RIGHT greater than LEFT. Electronically Signed   By: Evangeline Dakin M.D.   On: 11/20/2018 20:45   Ct Head Code Stroke Wo Contrast  Addendum Date: 11/21/2018   ADDENDUM REPORT: 11/21/2018 15:04 ADDENDUM: These results were called by telephone at the time of interpretation on 11/21/2018 at 2:46 PM to Dr. Irine Seal , who verbally acknowledged these results. Electronically Signed   By: Ulyses Jarred M.D.   On: 11/21/2018 15:04   Result Date: 11/21/2018 CLINICAL DATA:  Code stroke.  Slurred speech EXAM: CT HEAD WITHOUT CONTRAST TECHNIQUE: Contiguous axial images were obtained from the base of the skull through the vertex without intravenous contrast. COMPARISON:  Head CT 08/09/2018 FINDINGS: Brain: There is no mass, hemorrhage or extra-axial collection. There is generalized atrophy without lobar predilection. Areas of hypoattenuation of the deep gray nuclei and confluent periventricular white matter hypodensity, consistent with chronic  small vessel disease. Vascular: Atherosclerotic calcification of the internal carotid arteries at the skull base. No abnormal hyperdensity of the major intracranial arteries or dural venous sinuses. Skull: The visualized skull base, calvarium and extracranial soft tissues are normal. Sinuses/Orbits: No fluid levels or advanced mucosal thickening of the visualized paranasal sinuses. No mastoid or middle ear effusion. The orbits are normal. ASPECTS [facility] Stroke Program Early CT Score) - Ganglionic level infarction (caudate, lentiform nuclei, internal capsule, insula, M1-M3 cortex): 7 - Supraganglionic infarction (M4-M6 cortex): 3 Total score (0-10 with 10 being normal): 10 IMPRESSION: 1. No acute hemorrhage. 2. Chronic ischemic microangiopathy and generalized atrophy. 3. ASPECTS is 10. Electronically Signed: By: Ulyses Jarred M.D. On: 11/21/2018 14:37   Vas US Carotid (at [place] Only)  Result Date: 11/22/2018 Carotid Arterial Duplex Study Indications:       CVA. Limitations:       Patient positioning, patient movement, respitory disturbance,                    vessel anatomy Comparison Study:  02/22/18 R 80-99% L 40-59% Performing Technologist: Oliver Hum RVT  Examination Guidelines: Luvada Salamone complete evaluation includes B-mode imaging, spectral Doppler, color Doppler, and power Doppler as needed of all accessible portions of each vessel. Bilateral testing is considered an integral part of Reianna Batdorf complete examination. Limited examinations for reoccurring indications may be performed as noted.  Right Carotid Findings: +----------+--------+-------+--------+----------------------+------------------+           PSV cm/sEDV    StenosisDescribe              Comments                             cm/s                                                    +----------+--------+-------+--------+----------------------+------------------+ CCA Prox  47      9              smooth and            Abnormal  waveforms                                   heterogenous                             +----------+--------+-------+--------+----------------------+------------------+ CCA Distal57      10             smooth and                                                                heterogenous                             +----------+--------+-------+--------+----------------------+------------------+ ICA Prox  281     114    80-99%  calcific                                 +----------+--------+-------+--------+----------------------+------------------+ ICA Distal113     38                                   tortuous           +----------+--------+-------+--------+----------------------+------------------+ ECA       341     43                                                      +----------+--------+-------+--------+----------------------+------------------+ +----------+--------+-------+--------+-------------------+           PSV cm/sEDV cmsDescribeArm Pressure (mmHG) +----------+--------+-------+--------+-------------------+ [id number]                                         +----------+--------+-------+--------+-------------------+ +---------+--------+--+--------+-+---------+ VertebralPSV cm/s48EDV cm/s8Antegrade +---------+--------+--+--------+-+---------+  Left Carotid Findings: +----------+--------+--------+--------+-----------------------+--------+           PSV cm/sEDV cm/sStenosisDescribe               Comments +----------+--------+--------+--------+-----------------------+--------+ CCA Prox  111     22              smooth and heterogenous         +----------+--------+--------+--------+-----------------------+--------+ CCA Distal102     30              smooth and heterogenous         +----------+--------+--------+--------+-----------------------+--------+ ICA Prox  143     42      40-59%  smooth and heterogenous          +----------+--------+--------+--------+-----------------------+--------+ ICA Distal88      34                                     tortuous +----------+--------+--------+--------+-----------------------+--------+ ECA       181                                                     +----------+--------+--------+--------+-----------------------+--------+ +----------+--------+--------+--------+-------------------+  SubclavianPSV cm/sEDV cm/sDescribeArm Pressure (mmHG) +----------+--------+--------+--------+-------------------+           221                                         +----------+--------+--------+--------+-------------------+ +---------+--------+--+--------+--+---------+ VertebralPSV cm/s42EDV cm/s12Antegrade +---------+--------+--+--------+--+---------+  Summary: Right Carotid: Velocities in the right ICA are consistent with Maghan Jessee 80-99%                stenosis. Left Carotid: Velocities in the left ICA are consistent with Aldena Worm 40-59% stenosis. Vertebrals: Bilateral vertebral arteries demonstrate antegrade flow. *See table(s) above for measurements and observations.  Electronically signed by Antony Contras MD on 11/22/2018 at 6:11:57 PM.    Final     Microbiology: No results found for this or any previous visit (from the past 240 hour(s)).   Labs: Basic Metabolic Panel: Recent Labs  Lab 11/23/18 0558 11/24/18 0533 11/25/18 6222 11/26/18 0532 11/27/18 0544 11/28/18 0511 11/29/18 0528  NA 146* 149* 143 142  142 142 140 139  K 4.3 4.6 3.8 3.9  3.9 3.6 3.4* 3.8  CL 107 112* 105 104  105 106 105 105  CO2 23 25 26 26  25 28 26 26   GLUCOSE 73 79 105* 118*  117* 107* 87 95  BUN 56* 55* 44* 36*  37* 33* 30* 30*  CREATININE 2.40* 2.53* 2.24* 2.02*  2.05* 1.86* 1.99* 1.74*  CALCIUM 8.5* 8.8* 8.6* 8.4*  8.3* 8.3* 8.1* 8.3*  MG  --  2.1  --  2.0 1.8 1.9 1.9  PHOS 3.8 3.4 2.5 1.8*  --   --   --    Liver Function Tests: Recent Labs  Lab 11/25/18 0608 11/26/18 0532  11/27/18 0544 11/28/18 0511 11/29/18 0528  AST 40 28 23 19 18   ALT 31 24 17 15 14   ALKPHOS 42 39 41 40 43  BILITOT 0.8 0.6 0.6 0.6 0.5  PROT 8.0 7.2 7.1 6.7 7.0  ALBUMIN 2.8*  2.6* 2.4*  2.4* 2.3* 2.2* 2.3*   No results for input(s): LIPASE, AMYLASE in the last 168 hours. No results for input(s): AMMONIA in the last 168 hours. CBC: Recent Labs  Lab 11/23/18 0558  11/25/18 0608 11/26/18 0532 11/27/18 0544 11/28/18 0511 11/28/18 1338 11/29/18 0528  WBC 4.8   < > 4.6 4.3 3.7* 3.5*  --  4.0  NEUTROABS 3.4  --   --   --   --   --   --   --   HGB 7.2*   < > 8.3* 8.6* 8.6* 7.6* 8.2* 8.0*  HCT 24.0*   < > 26.8* 28.2* 27.7* 24.8* 26.9* 26.3*  MCV 100.0   < > 97.5 99.6 96.2 98.8  --  97.4  PLT 128*   < > 147* 121* 141* 128*  --  152   < > = values in this interval not displayed.   Cardiac Enzymes: No results for input(s): CKTOTAL, CKMB, CKMBINDEX, TROPONINI in the last 168 hours. BNP: BNP (last 3 results) Recent Labs    06/02/18 1628 07/29/18 1119 08/09/18 1629  BNP 627.9* 360.4* 1,084.8*    ProBNP (last 3 results) No results for input(s): PROBNP in the last 8760 hours.  CBG: No results for input(s): GLUCAP in the last 168 hours.     Signed:  Fayrene Helper MD.  [facility] 11/29/2018, 10:34 AM

## 2018-11-29 NOTE — Progress Notes (Signed)
Occupational Therapy Treatment Patient Details Name: SOLACE WENDORFF MRN: [id number] DOB: Apr 13, 1924 Today's Date: 11/29/2018    History of present illness 85 y.o. male admitted to ED on 11/15/18 from [facility] for ARF, groin pain.  PMH includes CAD with multiple cardiac caths as well as CABG, HTN, CKD stage III, hypothyroidism, dementia, pleural effusion,  anemia, chronic heart failure, stroke, COPD, dyspnea, PAD/PVD, and HLD.   OT comments  PATIENT WAS VERY SLEEPY AND REQUIRED ENCOURAGEMENT TO WAKE UP AND PARTICIPATE IN THERAPY. PATIENT REQUIRED EXTENSIVE ASSIST WITH GROOMING TASK TODAY. PATIENT REQUIRED MAX A TO DON GOWN. PATIENT DEFERRED SITTING EOB.   Follow Up Recommendations       Equipment Recommendations       Recommendations for Other Services      Precautions / Restrictions Precautions Precautions: Fall       Mobility Bed Mobility                  Transfers                      Balance                                           ADL either performed or assessed with clinical judgement   ADL Overall ADL's : Needs assistance/impaired     Grooming: Wash/dry hands;Wash/dry face;Moderate assistance           Upper Body Dressing : Maximal assistance                     General ADL Comments: PATIENT REQUIRED ENCOURAGEMENT TO PARTICIPATE WITH GROOMING AND UE DRESSING TASK.     Vision       Perception     Praxis      Cognition Arousal/Alertness: Lethargic Behavior During Therapy: WFL for tasks assessed/performed                                            Exercises     Shoulder Instructions       General Comments      Pertinent Vitals/ Pain       Pain Assessment: 0-10 Pain Score: 5  Pain Location: feet Pain Descriptors / Indicators: Aching;Sore Pain Intervention(s): Monitored during session;Patient requesting pain meds-RN notified  Home Living                                           Prior Functioning/Environment              Frequency           Progress Toward Goals  OT Goals(current goals can now be found in the care plan section)  Progress towards OT goals: Progressing toward goals  Acute Rehab OT Goals Patient Stated Goal: GO HOME  Plan      Co-evaluation                 AM-PAC OT "6 Clicks" Daily Activity     Outcome Measure   Help from another person eating meals?: A Lot Help from another person taking care of personal grooming?: A Lot Help from another  person toileting, which includes using toliet, bedpan, or urinal?: Total Help from another person bathing (including washing, rinsing, drying)?: Total Help from another person to put on and taking off regular upper body clothing?: A Lot Help from another person to put on and taking off regular lower body clothing?: Total 6 Click Score: 9    End of Session        Activity Tolerance Patient limited by fatigue;Patient limited by lethargy   Patient Left in bed;with call bell/phone within reach;with bed alarm set   Nurse Communication Patient requests pain meds(OK THERAPY)        Time: 1035-1100 OT Time Calculation (min): 25 min  Charges: OT General Charges $OT Visit: 1 Visit OT Treatments $Self Care/Home Management : 17-35 mins  6 CLICKS   Rambo Sarafian 11/29/2018, 11:41 AM

## 2018-11-29 NOTE — Clinical Social Work Placement (Signed)
   Patient to return to LTC at [facility].   LCSW confirmed return with facility.   LCSW faxed dc docs to facility.   Patient will transport by [facility].   RN report # [phone]  BKJ  CLINICAL SOCIAL WORK PLACEMENT  NOTE  Date:  11/29/2018  Patient Details  Name: John Peck MRN: [id number] Date of Birth: [birth year]  Clinical Social Work is seeking post-discharge placement for this patient at the   level of care (*CSW will initial, date and re-position this form in  chart as items are completed):      Patient/family provided with [place] Work Department's list of facilities offering this level of care within the geographic area requested by the patient (or if unable, by the patient's family).      Patient/family informed of their freedom to choose among providers that offer the needed level of care, that participate in Medicare, Medicaid or managed care program needed by the patient, have an available bed and are willing to accept the patient.      Patient/family informed of [facility]'s ownership interest in [facility] and [facility], as well as of the fact that they are under no obligation to receive care at these facilities.  PASRR submitted to EDS on       PASRR number received on       Existing PASRR number confirmed on       FL2 transmitted to all facilities in geographic area requested by pt/family on       FL2 transmitted to all facilities within larger geographic area on       Patient informed that his/her managed care company has contracts with or will negotiate with certain facilities, including the following:            Patient/family informed of bed offers received.  Patient chooses bed at       Physician recommends and patient chooses bed at      Patient to be transferred to  [facility] on  11/29/2018.  Patient to be transferred to facility by     EMS  Patient family notified on  11/29/2018 of transfer.  Name of  family member notified:    Cathlyn Parsons     PHYSICIAN       Additional Comment:    _______________________________________________ Servando Snare, LCSW 11/29/2018, 10:41 AM

## 2018-11-30 DIAGNOSIS — R1312 Dysphagia, oropharyngeal phase: Secondary | ICD-10-CM | POA: Diagnosis not present

## 2018-11-30 DIAGNOSIS — R2681 Unsteadiness on feet: Secondary | ICD-10-CM | POA: Diagnosis not present

## 2018-11-30 DIAGNOSIS — R2689 Other abnormalities of gait and mobility: Secondary | ICD-10-CM | POA: Diagnosis not present

## 2018-11-30 DIAGNOSIS — M6281 Muscle weakness (generalized): Secondary | ICD-10-CM | POA: Diagnosis not present

## 2018-11-30 DIAGNOSIS — R41841 Cognitive communication deficit: Secondary | ICD-10-CM | POA: Diagnosis not present

## 2018-11-30 DIAGNOSIS — R278 Other lack of coordination: Secondary | ICD-10-CM | POA: Diagnosis not present

## 2018-11-30 DIAGNOSIS — R471 Dysarthria and anarthria: Secondary | ICD-10-CM | POA: Diagnosis not present

## 2018-12-01 DIAGNOSIS — I5189 Other ill-defined heart diseases: Secondary | ICD-10-CM | POA: Diagnosis not present

## 2018-12-01 DIAGNOSIS — R2689 Other abnormalities of gait and mobility: Secondary | ICD-10-CM | POA: Diagnosis not present

## 2018-12-01 DIAGNOSIS — N179 Acute kidney failure, unspecified: Secondary | ICD-10-CM | POA: Diagnosis not present

## 2018-12-01 DIAGNOSIS — G4089 Other seizures: Secondary | ICD-10-CM | POA: Diagnosis not present

## 2018-12-01 DIAGNOSIS — R278 Other lack of coordination: Secondary | ICD-10-CM | POA: Diagnosis not present

## 2018-12-01 DIAGNOSIS — R41841 Cognitive communication deficit: Secondary | ICD-10-CM | POA: Diagnosis not present

## 2018-12-01 DIAGNOSIS — M6281 Muscle weakness (generalized): Secondary | ICD-10-CM | POA: Diagnosis not present

## 2018-12-01 DIAGNOSIS — R1312 Dysphagia, oropharyngeal phase: Secondary | ICD-10-CM | POA: Diagnosis not present

## 2018-12-01 DIAGNOSIS — R471 Dysarthria and anarthria: Secondary | ICD-10-CM | POA: Diagnosis not present

## 2018-12-01 DIAGNOSIS — R2681 Unsteadiness on feet: Secondary | ICD-10-CM | POA: Diagnosis not present

## 2018-12-01 DIAGNOSIS — D508 Other iron deficiency anemias: Secondary | ICD-10-CM | POA: Diagnosis not present

## 2018-12-01 DIAGNOSIS — Z515 Encounter for palliative care: Secondary | ICD-10-CM | POA: Diagnosis not present

## 2018-12-02 DIAGNOSIS — D638 Anemia in other chronic diseases classified elsewhere: Secondary | ICD-10-CM | POA: Diagnosis not present

## 2018-12-02 DIAGNOSIS — R471 Dysarthria and anarthria: Secondary | ICD-10-CM | POA: Diagnosis not present

## 2018-12-02 DIAGNOSIS — M6281 Muscle weakness (generalized): Secondary | ICD-10-CM | POA: Diagnosis not present

## 2018-12-02 DIAGNOSIS — R41841 Cognitive communication deficit: Secondary | ICD-10-CM | POA: Diagnosis not present

## 2018-12-02 DIAGNOSIS — R278 Other lack of coordination: Secondary | ICD-10-CM | POA: Diagnosis not present

## 2018-12-02 DIAGNOSIS — R2689 Other abnormalities of gait and mobility: Secondary | ICD-10-CM | POA: Diagnosis not present

## 2018-12-02 DIAGNOSIS — I480 Paroxysmal atrial fibrillation: Secondary | ICD-10-CM | POA: Diagnosis not present

## 2018-12-02 DIAGNOSIS — R1312 Dysphagia, oropharyngeal phase: Secondary | ICD-10-CM | POA: Diagnosis not present

## 2018-12-02 DIAGNOSIS — R2681 Unsteadiness on feet: Secondary | ICD-10-CM | POA: Diagnosis not present

## 2018-12-03 DIAGNOSIS — R2681 Unsteadiness on feet: Secondary | ICD-10-CM | POA: Diagnosis not present

## 2018-12-03 DIAGNOSIS — R2689 Other abnormalities of gait and mobility: Secondary | ICD-10-CM | POA: Diagnosis not present

## 2018-12-03 DIAGNOSIS — R471 Dysarthria and anarthria: Secondary | ICD-10-CM | POA: Diagnosis not present

## 2018-12-03 DIAGNOSIS — R41841 Cognitive communication deficit: Secondary | ICD-10-CM | POA: Diagnosis not present

## 2018-12-03 DIAGNOSIS — R278 Other lack of coordination: Secondary | ICD-10-CM | POA: Diagnosis not present

## 2018-12-03 DIAGNOSIS — M6281 Muscle weakness (generalized): Secondary | ICD-10-CM | POA: Diagnosis not present

## 2018-12-03 DIAGNOSIS — R1312 Dysphagia, oropharyngeal phase: Secondary | ICD-10-CM | POA: Diagnosis not present

## 2018-12-04 DIAGNOSIS — R278 Other lack of coordination: Secondary | ICD-10-CM | POA: Diagnosis not present

## 2018-12-04 DIAGNOSIS — R2689 Other abnormalities of gait and mobility: Secondary | ICD-10-CM | POA: Diagnosis not present

## 2018-12-04 DIAGNOSIS — R1312 Dysphagia, oropharyngeal phase: Secondary | ICD-10-CM | POA: Diagnosis not present

## 2018-12-04 DIAGNOSIS — M6281 Muscle weakness (generalized): Secondary | ICD-10-CM | POA: Diagnosis not present

## 2018-12-04 DIAGNOSIS — R41841 Cognitive communication deficit: Secondary | ICD-10-CM | POA: Diagnosis not present

## 2018-12-04 DIAGNOSIS — R471 Dysarthria and anarthria: Secondary | ICD-10-CM | POA: Diagnosis not present

## 2018-12-04 DIAGNOSIS — R2681 Unsteadiness on feet: Secondary | ICD-10-CM | POA: Diagnosis not present

## 2018-12-05 DIAGNOSIS — R1312 Dysphagia, oropharyngeal phase: Secondary | ICD-10-CM | POA: Diagnosis not present

## 2018-12-05 DIAGNOSIS — R471 Dysarthria and anarthria: Secondary | ICD-10-CM | POA: Diagnosis not present

## 2018-12-05 DIAGNOSIS — R278 Other lack of coordination: Secondary | ICD-10-CM | POA: Diagnosis not present

## 2018-12-05 DIAGNOSIS — R2681 Unsteadiness on feet: Secondary | ICD-10-CM | POA: Diagnosis not present

## 2018-12-05 DIAGNOSIS — M6281 Muscle weakness (generalized): Secondary | ICD-10-CM | POA: Diagnosis not present

## 2018-12-05 DIAGNOSIS — R2689 Other abnormalities of gait and mobility: Secondary | ICD-10-CM | POA: Diagnosis not present

## 2018-12-05 DIAGNOSIS — R41841 Cognitive communication deficit: Secondary | ICD-10-CM | POA: Diagnosis not present

## 2018-12-05 DIAGNOSIS — D464 Refractory anemia, unspecified: Secondary | ICD-10-CM | POA: Diagnosis not present

## 2018-12-06 DIAGNOSIS — R2689 Other abnormalities of gait and mobility: Secondary | ICD-10-CM | POA: Diagnosis not present

## 2018-12-06 DIAGNOSIS — R471 Dysarthria and anarthria: Secondary | ICD-10-CM | POA: Diagnosis not present

## 2018-12-06 DIAGNOSIS — M6281 Muscle weakness (generalized): Secondary | ICD-10-CM | POA: Diagnosis not present

## 2018-12-06 DIAGNOSIS — R278 Other lack of coordination: Secondary | ICD-10-CM | POA: Diagnosis not present

## 2018-12-06 DIAGNOSIS — R1312 Dysphagia, oropharyngeal phase: Secondary | ICD-10-CM | POA: Diagnosis not present

## 2018-12-06 DIAGNOSIS — R41841 Cognitive communication deficit: Secondary | ICD-10-CM | POA: Diagnosis not present

## 2018-12-06 DIAGNOSIS — R2681 Unsteadiness on feet: Secondary | ICD-10-CM | POA: Diagnosis not present

## 2018-12-07 DIAGNOSIS — I5042 Chronic combined systolic (congestive) and diastolic (congestive) heart failure: Secondary | ICD-10-CM | POA: Diagnosis not present

## 2018-12-07 DIAGNOSIS — M6281 Muscle weakness (generalized): Secondary | ICD-10-CM | POA: Diagnosis not present

## 2018-12-07 DIAGNOSIS — R2689 Other abnormalities of gait and mobility: Secondary | ICD-10-CM | POA: Diagnosis not present

## 2018-12-07 DIAGNOSIS — J449 Chronic obstructive pulmonary disease, unspecified: Secondary | ICD-10-CM | POA: Diagnosis not present

## 2018-12-07 DIAGNOSIS — R41841 Cognitive communication deficit: Secondary | ICD-10-CM | POA: Diagnosis not present

## 2018-12-07 DIAGNOSIS — I4891 Unspecified atrial fibrillation: Secondary | ICD-10-CM | POA: Diagnosis not present

## 2018-12-07 DIAGNOSIS — Z87440 Personal history of urinary (tract) infections: Secondary | ICD-10-CM | POA: Diagnosis not present

## 2018-12-07 DIAGNOSIS — R1312 Dysphagia, oropharyngeal phase: Secondary | ICD-10-CM | POA: Diagnosis not present

## 2018-12-07 DIAGNOSIS — R471 Dysarthria and anarthria: Secondary | ICD-10-CM | POA: Diagnosis not present

## 2018-12-07 DIAGNOSIS — N183 Chronic kidney disease, stage 3 (moderate): Secondary | ICD-10-CM | POA: Diagnosis not present

## 2018-12-07 DIAGNOSIS — Z515 Encounter for palliative care: Secondary | ICD-10-CM | POA: Diagnosis not present

## 2018-12-07 DIAGNOSIS — R2681 Unsteadiness on feet: Secondary | ICD-10-CM | POA: Diagnosis not present

## 2018-12-07 DIAGNOSIS — R278 Other lack of coordination: Secondary | ICD-10-CM | POA: Diagnosis not present

## 2018-12-07 DIAGNOSIS — I2510 Atherosclerotic heart disease of native coronary artery without angina pectoris: Secondary | ICD-10-CM | POA: Diagnosis not present

## 2018-12-08 DIAGNOSIS — R41841 Cognitive communication deficit: Secondary | ICD-10-CM | POA: Diagnosis not present

## 2018-12-08 DIAGNOSIS — R2689 Other abnormalities of gait and mobility: Secondary | ICD-10-CM | POA: Diagnosis not present

## 2018-12-08 DIAGNOSIS — R278 Other lack of coordination: Secondary | ICD-10-CM | POA: Diagnosis not present

## 2018-12-08 DIAGNOSIS — M6281 Muscle weakness (generalized): Secondary | ICD-10-CM | POA: Diagnosis not present

## 2018-12-08 DIAGNOSIS — R2681 Unsteadiness on feet: Secondary | ICD-10-CM | POA: Diagnosis not present

## 2018-12-08 DIAGNOSIS — R1312 Dysphagia, oropharyngeal phase: Secondary | ICD-10-CM | POA: Diagnosis not present

## 2018-12-08 DIAGNOSIS — R471 Dysarthria and anarthria: Secondary | ICD-10-CM | POA: Diagnosis not present

## 2018-12-09 DIAGNOSIS — R2681 Unsteadiness on feet: Secondary | ICD-10-CM | POA: Diagnosis not present

## 2018-12-09 DIAGNOSIS — R41841 Cognitive communication deficit: Secondary | ICD-10-CM | POA: Diagnosis not present

## 2018-12-09 DIAGNOSIS — R1312 Dysphagia, oropharyngeal phase: Secondary | ICD-10-CM | POA: Diagnosis not present

## 2018-12-09 DIAGNOSIS — R278 Other lack of coordination: Secondary | ICD-10-CM | POA: Diagnosis not present

## 2018-12-09 DIAGNOSIS — M6281 Muscle weakness (generalized): Secondary | ICD-10-CM | POA: Diagnosis not present

## 2018-12-09 DIAGNOSIS — R2689 Other abnormalities of gait and mobility: Secondary | ICD-10-CM | POA: Diagnosis not present

## 2018-12-09 DIAGNOSIS — R471 Dysarthria and anarthria: Secondary | ICD-10-CM | POA: Diagnosis not present

## 2018-12-10 DIAGNOSIS — R278 Other lack of coordination: Secondary | ICD-10-CM | POA: Diagnosis not present

## 2018-12-10 DIAGNOSIS — R2681 Unsteadiness on feet: Secondary | ICD-10-CM | POA: Diagnosis not present

## 2018-12-10 DIAGNOSIS — R1312 Dysphagia, oropharyngeal phase: Secondary | ICD-10-CM | POA: Diagnosis not present

## 2018-12-10 DIAGNOSIS — R2689 Other abnormalities of gait and mobility: Secondary | ICD-10-CM | POA: Diagnosis not present

## 2018-12-10 DIAGNOSIS — R41841 Cognitive communication deficit: Secondary | ICD-10-CM | POA: Diagnosis not present

## 2018-12-10 DIAGNOSIS — R471 Dysarthria and anarthria: Secondary | ICD-10-CM | POA: Diagnosis not present

## 2018-12-10 DIAGNOSIS — M6281 Muscle weakness (generalized): Secondary | ICD-10-CM | POA: Diagnosis not present

## 2018-12-11 DIAGNOSIS — R1312 Dysphagia, oropharyngeal phase: Secondary | ICD-10-CM | POA: Diagnosis not present

## 2018-12-11 DIAGNOSIS — R2689 Other abnormalities of gait and mobility: Secondary | ICD-10-CM | POA: Diagnosis not present

## 2018-12-11 DIAGNOSIS — R278 Other lack of coordination: Secondary | ICD-10-CM | POA: Diagnosis not present

## 2018-12-11 DIAGNOSIS — R41841 Cognitive communication deficit: Secondary | ICD-10-CM | POA: Diagnosis not present

## 2018-12-11 DIAGNOSIS — R471 Dysarthria and anarthria: Secondary | ICD-10-CM | POA: Diagnosis not present

## 2018-12-11 DIAGNOSIS — R2681 Unsteadiness on feet: Secondary | ICD-10-CM | POA: Diagnosis not present

## 2018-12-11 DIAGNOSIS — M6281 Muscle weakness (generalized): Secondary | ICD-10-CM | POA: Diagnosis not present

## 2018-12-12 DIAGNOSIS — M6281 Muscle weakness (generalized): Secondary | ICD-10-CM | POA: Diagnosis not present

## 2018-12-12 DIAGNOSIS — R278 Other lack of coordination: Secondary | ICD-10-CM | POA: Diagnosis not present

## 2018-12-12 DIAGNOSIS — R2689 Other abnormalities of gait and mobility: Secondary | ICD-10-CM | POA: Diagnosis not present

## 2018-12-12 DIAGNOSIS — R1312 Dysphagia, oropharyngeal phase: Secondary | ICD-10-CM | POA: Diagnosis not present

## 2018-12-12 DIAGNOSIS — R2681 Unsteadiness on feet: Secondary | ICD-10-CM | POA: Diagnosis not present

## 2018-12-12 DIAGNOSIS — R471 Dysarthria and anarthria: Secondary | ICD-10-CM | POA: Diagnosis not present

## 2018-12-12 DIAGNOSIS — R41841 Cognitive communication deficit: Secondary | ICD-10-CM | POA: Diagnosis not present

## 2018-12-13 DIAGNOSIS — R2681 Unsteadiness on feet: Secondary | ICD-10-CM | POA: Diagnosis not present

## 2018-12-13 DIAGNOSIS — R2689 Other abnormalities of gait and mobility: Secondary | ICD-10-CM | POA: Diagnosis not present

## 2018-12-13 DIAGNOSIS — M6281 Muscle weakness (generalized): Secondary | ICD-10-CM | POA: Diagnosis not present

## 2018-12-13 DIAGNOSIS — R278 Other lack of coordination: Secondary | ICD-10-CM | POA: Diagnosis not present

## 2018-12-13 DIAGNOSIS — R471 Dysarthria and anarthria: Secondary | ICD-10-CM | POA: Diagnosis not present

## 2018-12-13 DIAGNOSIS — R41841 Cognitive communication deficit: Secondary | ICD-10-CM | POA: Diagnosis not present

## 2018-12-13 DIAGNOSIS — R1312 Dysphagia, oropharyngeal phase: Secondary | ICD-10-CM | POA: Diagnosis not present

## 2018-12-14 DIAGNOSIS — R41841 Cognitive communication deficit: Secondary | ICD-10-CM | POA: Diagnosis not present

## 2018-12-14 DIAGNOSIS — M6281 Muscle weakness (generalized): Secondary | ICD-10-CM | POA: Diagnosis not present

## 2018-12-14 DIAGNOSIS — R471 Dysarthria and anarthria: Secondary | ICD-10-CM | POA: Diagnosis not present

## 2018-12-14 DIAGNOSIS — R2689 Other abnormalities of gait and mobility: Secondary | ICD-10-CM | POA: Diagnosis not present

## 2018-12-14 DIAGNOSIS — R1312 Dysphagia, oropharyngeal phase: Secondary | ICD-10-CM | POA: Diagnosis not present

## 2018-12-14 DIAGNOSIS — R278 Other lack of coordination: Secondary | ICD-10-CM | POA: Diagnosis not present

## 2018-12-14 DIAGNOSIS — R2681 Unsteadiness on feet: Secondary | ICD-10-CM | POA: Diagnosis not present

## 2018-12-15 DIAGNOSIS — R2681 Unsteadiness on feet: Secondary | ICD-10-CM | POA: Diagnosis not present

## 2018-12-15 DIAGNOSIS — R2689 Other abnormalities of gait and mobility: Secondary | ICD-10-CM | POA: Diagnosis not present

## 2018-12-15 DIAGNOSIS — R471 Dysarthria and anarthria: Secondary | ICD-10-CM | POA: Diagnosis not present

## 2018-12-15 DIAGNOSIS — R1312 Dysphagia, oropharyngeal phase: Secondary | ICD-10-CM | POA: Diagnosis not present

## 2018-12-15 DIAGNOSIS — R41841 Cognitive communication deficit: Secondary | ICD-10-CM | POA: Diagnosis not present

## 2018-12-15 DIAGNOSIS — R278 Other lack of coordination: Secondary | ICD-10-CM | POA: Diagnosis not present

## 2018-12-15 DIAGNOSIS — M6281 Muscle weakness (generalized): Secondary | ICD-10-CM | POA: Diagnosis not present

## 2018-12-16 DIAGNOSIS — M6281 Muscle weakness (generalized): Secondary | ICD-10-CM | POA: Diagnosis not present

## 2018-12-16 DIAGNOSIS — R41841 Cognitive communication deficit: Secondary | ICD-10-CM | POA: Diagnosis not present

## 2018-12-16 DIAGNOSIS — R471 Dysarthria and anarthria: Secondary | ICD-10-CM | POA: Diagnosis not present

## 2018-12-16 DIAGNOSIS — R1312 Dysphagia, oropharyngeal phase: Secondary | ICD-10-CM | POA: Diagnosis not present

## 2018-12-16 DIAGNOSIS — R2689 Other abnormalities of gait and mobility: Secondary | ICD-10-CM | POA: Diagnosis not present

## 2018-12-16 DIAGNOSIS — R2681 Unsteadiness on feet: Secondary | ICD-10-CM | POA: Diagnosis not present

## 2018-12-16 DIAGNOSIS — R278 Other lack of coordination: Secondary | ICD-10-CM | POA: Diagnosis not present

## 2018-12-17 DIAGNOSIS — R278 Other lack of coordination: Secondary | ICD-10-CM | POA: Diagnosis not present

## 2018-12-17 DIAGNOSIS — M6281 Muscle weakness (generalized): Secondary | ICD-10-CM | POA: Diagnosis not present

## 2018-12-17 DIAGNOSIS — R41841 Cognitive communication deficit: Secondary | ICD-10-CM | POA: Diagnosis not present

## 2018-12-17 DIAGNOSIS — R2681 Unsteadiness on feet: Secondary | ICD-10-CM | POA: Diagnosis not present

## 2018-12-17 DIAGNOSIS — R471 Dysarthria and anarthria: Secondary | ICD-10-CM | POA: Diagnosis not present

## 2018-12-17 DIAGNOSIS — R1312 Dysphagia, oropharyngeal phase: Secondary | ICD-10-CM | POA: Diagnosis not present

## 2018-12-17 DIAGNOSIS — R2689 Other abnormalities of gait and mobility: Secondary | ICD-10-CM | POA: Diagnosis not present

## 2018-12-19 DIAGNOSIS — R1312 Dysphagia, oropharyngeal phase: Secondary | ICD-10-CM | POA: Diagnosis not present

## 2018-12-19 DIAGNOSIS — M6281 Muscle weakness (generalized): Secondary | ICD-10-CM | POA: Diagnosis not present

## 2018-12-19 DIAGNOSIS — R2689 Other abnormalities of gait and mobility: Secondary | ICD-10-CM | POA: Diagnosis not present

## 2018-12-19 DIAGNOSIS — R2681 Unsteadiness on feet: Secondary | ICD-10-CM | POA: Diagnosis not present

## 2018-12-19 DIAGNOSIS — R41841 Cognitive communication deficit: Secondary | ICD-10-CM | POA: Diagnosis not present

## 2018-12-19 DIAGNOSIS — G6289 Other specified polyneuropathies: Secondary | ICD-10-CM | POA: Diagnosis not present

## 2018-12-19 DIAGNOSIS — R471 Dysarthria and anarthria: Secondary | ICD-10-CM | POA: Diagnosis not present

## 2018-12-19 DIAGNOSIS — R278 Other lack of coordination: Secondary | ICD-10-CM | POA: Diagnosis not present

## 2018-12-20 DIAGNOSIS — R1312 Dysphagia, oropharyngeal phase: Secondary | ICD-10-CM | POA: Diagnosis not present

## 2018-12-20 DIAGNOSIS — R278 Other lack of coordination: Secondary | ICD-10-CM | POA: Diagnosis not present

## 2018-12-20 DIAGNOSIS — R471 Dysarthria and anarthria: Secondary | ICD-10-CM | POA: Diagnosis not present

## 2018-12-20 DIAGNOSIS — M6281 Muscle weakness (generalized): Secondary | ICD-10-CM | POA: Diagnosis not present

## 2018-12-20 DIAGNOSIS — R2681 Unsteadiness on feet: Secondary | ICD-10-CM | POA: Diagnosis not present

## 2018-12-20 DIAGNOSIS — R41841 Cognitive communication deficit: Secondary | ICD-10-CM | POA: Diagnosis not present

## 2018-12-20 DIAGNOSIS — R2689 Other abnormalities of gait and mobility: Secondary | ICD-10-CM | POA: Diagnosis not present

## 2018-12-21 DIAGNOSIS — R1312 Dysphagia, oropharyngeal phase: Secondary | ICD-10-CM | POA: Diagnosis not present

## 2018-12-21 DIAGNOSIS — R41841 Cognitive communication deficit: Secondary | ICD-10-CM | POA: Diagnosis not present

## 2018-12-21 DIAGNOSIS — R2681 Unsteadiness on feet: Secondary | ICD-10-CM | POA: Diagnosis not present

## 2018-12-21 DIAGNOSIS — R471 Dysarthria and anarthria: Secondary | ICD-10-CM | POA: Diagnosis not present

## 2018-12-21 DIAGNOSIS — R2689 Other abnormalities of gait and mobility: Secondary | ICD-10-CM | POA: Diagnosis not present

## 2018-12-21 DIAGNOSIS — R278 Other lack of coordination: Secondary | ICD-10-CM | POA: Diagnosis not present

## 2018-12-21 DIAGNOSIS — M6281 Muscle weakness (generalized): Secondary | ICD-10-CM | POA: Diagnosis not present

## 2018-12-22 DIAGNOSIS — R41841 Cognitive communication deficit: Secondary | ICD-10-CM | POA: Diagnosis not present

## 2018-12-22 DIAGNOSIS — R2681 Unsteadiness on feet: Secondary | ICD-10-CM | POA: Diagnosis not present

## 2018-12-22 DIAGNOSIS — R1312 Dysphagia, oropharyngeal phase: Secondary | ICD-10-CM | POA: Diagnosis not present

## 2018-12-22 DIAGNOSIS — R278 Other lack of coordination: Secondary | ICD-10-CM | POA: Diagnosis not present

## 2018-12-22 DIAGNOSIS — R471 Dysarthria and anarthria: Secondary | ICD-10-CM | POA: Diagnosis not present

## 2018-12-22 DIAGNOSIS — M6281 Muscle weakness (generalized): Secondary | ICD-10-CM | POA: Diagnosis not present

## 2018-12-22 DIAGNOSIS — R2689 Other abnormalities of gait and mobility: Secondary | ICD-10-CM | POA: Diagnosis not present

## 2018-12-23 DIAGNOSIS — R278 Other lack of coordination: Secondary | ICD-10-CM | POA: Diagnosis not present

## 2018-12-23 DIAGNOSIS — M6281 Muscle weakness (generalized): Secondary | ICD-10-CM | POA: Diagnosis not present

## 2018-12-23 DIAGNOSIS — R1312 Dysphagia, oropharyngeal phase: Secondary | ICD-10-CM | POA: Diagnosis not present

## 2018-12-23 DIAGNOSIS — R471 Dysarthria and anarthria: Secondary | ICD-10-CM | POA: Diagnosis not present

## 2018-12-23 DIAGNOSIS — R2681 Unsteadiness on feet: Secondary | ICD-10-CM | POA: Diagnosis not present

## 2018-12-23 DIAGNOSIS — R2689 Other abnormalities of gait and mobility: Secondary | ICD-10-CM | POA: Diagnosis not present

## 2018-12-23 DIAGNOSIS — R41841 Cognitive communication deficit: Secondary | ICD-10-CM | POA: Diagnosis not present

## 2018-12-25 DIAGNOSIS — R2689 Other abnormalities of gait and mobility: Secondary | ICD-10-CM | POA: Diagnosis not present

## 2018-12-25 DIAGNOSIS — R1312 Dysphagia, oropharyngeal phase: Secondary | ICD-10-CM | POA: Diagnosis not present

## 2018-12-25 DIAGNOSIS — M6281 Muscle weakness (generalized): Secondary | ICD-10-CM | POA: Diagnosis not present

## 2018-12-25 DIAGNOSIS — R41841 Cognitive communication deficit: Secondary | ICD-10-CM | POA: Diagnosis not present

## 2018-12-25 DIAGNOSIS — R471 Dysarthria and anarthria: Secondary | ICD-10-CM | POA: Diagnosis not present

## 2018-12-25 DIAGNOSIS — R278 Other lack of coordination: Secondary | ICD-10-CM | POA: Diagnosis not present

## 2018-12-25 DIAGNOSIS — R2681 Unsteadiness on feet: Secondary | ICD-10-CM | POA: Diagnosis not present

## 2018-12-26 DIAGNOSIS — R2689 Other abnormalities of gait and mobility: Secondary | ICD-10-CM | POA: Diagnosis not present

## 2018-12-26 DIAGNOSIS — M6281 Muscle weakness (generalized): Secondary | ICD-10-CM | POA: Diagnosis not present

## 2018-12-26 DIAGNOSIS — R471 Dysarthria and anarthria: Secondary | ICD-10-CM | POA: Diagnosis not present

## 2018-12-26 DIAGNOSIS — R2681 Unsteadiness on feet: Secondary | ICD-10-CM | POA: Diagnosis not present

## 2018-12-26 DIAGNOSIS — R1312 Dysphagia, oropharyngeal phase: Secondary | ICD-10-CM | POA: Diagnosis not present

## 2018-12-26 DIAGNOSIS — R278 Other lack of coordination: Secondary | ICD-10-CM | POA: Diagnosis not present

## 2018-12-26 DIAGNOSIS — R41841 Cognitive communication deficit: Secondary | ICD-10-CM | POA: Diagnosis not present

## 2018-12-27 DIAGNOSIS — M6281 Muscle weakness (generalized): Secondary | ICD-10-CM | POA: Diagnosis not present

## 2018-12-27 DIAGNOSIS — R278 Other lack of coordination: Secondary | ICD-10-CM | POA: Diagnosis not present

## 2018-12-27 DIAGNOSIS — R41841 Cognitive communication deficit: Secondary | ICD-10-CM | POA: Diagnosis not present

## 2018-12-27 DIAGNOSIS — R2689 Other abnormalities of gait and mobility: Secondary | ICD-10-CM | POA: Diagnosis not present

## 2018-12-27 DIAGNOSIS — R471 Dysarthria and anarthria: Secondary | ICD-10-CM | POA: Diagnosis not present

## 2018-12-27 DIAGNOSIS — R1312 Dysphagia, oropharyngeal phase: Secondary | ICD-10-CM | POA: Diagnosis not present

## 2018-12-27 DIAGNOSIS — R2681 Unsteadiness on feet: Secondary | ICD-10-CM | POA: Diagnosis not present

## 2018-12-28 DIAGNOSIS — R471 Dysarthria and anarthria: Secondary | ICD-10-CM | POA: Diagnosis not present

## 2018-12-28 DIAGNOSIS — R2689 Other abnormalities of gait and mobility: Secondary | ICD-10-CM | POA: Diagnosis not present

## 2018-12-28 DIAGNOSIS — M6281 Muscle weakness (generalized): Secondary | ICD-10-CM | POA: Diagnosis not present

## 2018-12-28 DIAGNOSIS — R41841 Cognitive communication deficit: Secondary | ICD-10-CM | POA: Diagnosis not present

## 2018-12-28 DIAGNOSIS — R1312 Dysphagia, oropharyngeal phase: Secondary | ICD-10-CM | POA: Diagnosis not present

## 2018-12-28 DIAGNOSIS — R278 Other lack of coordination: Secondary | ICD-10-CM | POA: Diagnosis not present

## 2018-12-28 DIAGNOSIS — R2681 Unsteadiness on feet: Secondary | ICD-10-CM | POA: Diagnosis not present

## 2018-12-29 DIAGNOSIS — R471 Dysarthria and anarthria: Secondary | ICD-10-CM | POA: Diagnosis not present

## 2018-12-29 DIAGNOSIS — R2681 Unsteadiness on feet: Secondary | ICD-10-CM | POA: Diagnosis not present

## 2018-12-29 DIAGNOSIS — M6281 Muscle weakness (generalized): Secondary | ICD-10-CM | POA: Diagnosis not present

## 2018-12-29 DIAGNOSIS — R1312 Dysphagia, oropharyngeal phase: Secondary | ICD-10-CM | POA: Diagnosis not present

## 2018-12-29 DIAGNOSIS — R2689 Other abnormalities of gait and mobility: Secondary | ICD-10-CM | POA: Diagnosis not present

## 2018-12-29 DIAGNOSIS — R278 Other lack of coordination: Secondary | ICD-10-CM | POA: Diagnosis not present

## 2018-12-29 DIAGNOSIS — R5381 Other malaise: Secondary | ICD-10-CM | POA: Diagnosis not present

## 2018-12-29 DIAGNOSIS — G4700 Insomnia, unspecified: Secondary | ICD-10-CM | POA: Diagnosis not present

## 2018-12-29 DIAGNOSIS — R41841 Cognitive communication deficit: Secondary | ICD-10-CM | POA: Diagnosis not present

## 2018-12-30 DIAGNOSIS — R2681 Unsteadiness on feet: Secondary | ICD-10-CM | POA: Diagnosis not present

## 2018-12-30 DIAGNOSIS — M6281 Muscle weakness (generalized): Secondary | ICD-10-CM | POA: Diagnosis not present

## 2018-12-30 DIAGNOSIS — R2689 Other abnormalities of gait and mobility: Secondary | ICD-10-CM | POA: Diagnosis not present

## 2018-12-30 DIAGNOSIS — R471 Dysarthria and anarthria: Secondary | ICD-10-CM | POA: Diagnosis not present

## 2018-12-30 DIAGNOSIS — R278 Other lack of coordination: Secondary | ICD-10-CM | POA: Diagnosis not present

## 2018-12-30 DIAGNOSIS — R1312 Dysphagia, oropharyngeal phase: Secondary | ICD-10-CM | POA: Diagnosis not present

## 2018-12-30 DIAGNOSIS — R41841 Cognitive communication deficit: Secondary | ICD-10-CM | POA: Diagnosis not present

## 2019-01-02 ENCOUNTER — Ambulatory Visit: Payer: Medicare HMO | Admitting: Cardiology

## 2019-01-02 DIAGNOSIS — R1312 Dysphagia, oropharyngeal phase: Secondary | ICD-10-CM | POA: Diagnosis not present

## 2019-01-02 DIAGNOSIS — M6281 Muscle weakness (generalized): Secondary | ICD-10-CM | POA: Diagnosis not present

## 2019-01-02 DIAGNOSIS — R2689 Other abnormalities of gait and mobility: Secondary | ICD-10-CM | POA: Diagnosis not present

## 2019-01-02 DIAGNOSIS — R278 Other lack of coordination: Secondary | ICD-10-CM | POA: Diagnosis not present

## 2019-01-02 DIAGNOSIS — R2681 Unsteadiness on feet: Secondary | ICD-10-CM | POA: Diagnosis not present

## 2019-01-02 DIAGNOSIS — R41841 Cognitive communication deficit: Secondary | ICD-10-CM | POA: Diagnosis not present

## 2019-01-02 DIAGNOSIS — R471 Dysarthria and anarthria: Secondary | ICD-10-CM | POA: Diagnosis not present

## 2019-01-03 DIAGNOSIS — R1312 Dysphagia, oropharyngeal phase: Secondary | ICD-10-CM | POA: Diagnosis not present

## 2019-01-03 DIAGNOSIS — Z20828 Contact with and (suspected) exposure to other viral communicable diseases: Secondary | ICD-10-CM | POA: Diagnosis not present

## 2019-01-03 DIAGNOSIS — R471 Dysarthria and anarthria: Secondary | ICD-10-CM | POA: Diagnosis not present

## 2019-01-03 DIAGNOSIS — R278 Other lack of coordination: Secondary | ICD-10-CM | POA: Diagnosis not present

## 2019-01-03 DIAGNOSIS — M6281 Muscle weakness (generalized): Secondary | ICD-10-CM | POA: Diagnosis not present

## 2019-01-03 DIAGNOSIS — R2689 Other abnormalities of gait and mobility: Secondary | ICD-10-CM | POA: Diagnosis not present

## 2019-01-03 DIAGNOSIS — R2681 Unsteadiness on feet: Secondary | ICD-10-CM | POA: Diagnosis not present

## 2019-01-03 DIAGNOSIS — R41841 Cognitive communication deficit: Secondary | ICD-10-CM | POA: Diagnosis not present

## 2019-01-03 DIAGNOSIS — I480 Paroxysmal atrial fibrillation: Secondary | ICD-10-CM | POA: Diagnosis not present

## 2019-01-04 ENCOUNTER — Ambulatory Visit (INDEPENDENT_AMBULATORY_CARE_PROVIDER_SITE_OTHER): Payer: Medicare HMO | Admitting: Adult Health

## 2019-01-04 ENCOUNTER — Encounter: Payer: Self-pay | Admitting: Adult Health

## 2019-01-04 VITALS — BP 130/62 | HR 61 | Ht 69.0 in | Wt 129.2 lb

## 2019-01-04 DIAGNOSIS — R2689 Other abnormalities of gait and mobility: Secondary | ICD-10-CM | POA: Diagnosis not present

## 2019-01-04 DIAGNOSIS — R41841 Cognitive communication deficit: Secondary | ICD-10-CM | POA: Diagnosis not present

## 2019-01-04 DIAGNOSIS — E785 Hyperlipidemia, unspecified: Secondary | ICD-10-CM | POA: Diagnosis not present

## 2019-01-04 DIAGNOSIS — R2681 Unsteadiness on feet: Secondary | ICD-10-CM | POA: Diagnosis not present

## 2019-01-04 DIAGNOSIS — I10 Essential (primary) hypertension: Secondary | ICD-10-CM | POA: Diagnosis not present

## 2019-01-04 DIAGNOSIS — M6281 Muscle weakness (generalized): Secondary | ICD-10-CM | POA: Diagnosis not present

## 2019-01-04 DIAGNOSIS — I25118 Atherosclerotic heart disease of native coronary artery with other forms of angina pectoris: Secondary | ICD-10-CM | POA: Diagnosis not present

## 2019-01-04 DIAGNOSIS — R471 Dysarthria and anarthria: Secondary | ICD-10-CM | POA: Diagnosis not present

## 2019-01-04 DIAGNOSIS — I5032 Chronic diastolic (congestive) heart failure: Secondary | ICD-10-CM

## 2019-01-04 DIAGNOSIS — R1312 Dysphagia, oropharyngeal phase: Secondary | ICD-10-CM | POA: Diagnosis not present

## 2019-01-04 DIAGNOSIS — R278 Other lack of coordination: Secondary | ICD-10-CM | POA: Diagnosis not present

## 2019-01-04 NOTE — Patient Instructions (Signed)
Follow-Up: You will need a follow up appointment in 3 months.  You may see Shelva Majestic, MD, Jory Sims, DNP, AACC or one of the following Advanced Practice Providers on your designated Care Team:  Almyra Deforest, PA-C Fabian Sharp, [place]   Medication Instructions:  NO CHANGES- Your physician recommends that you continue on your current medications as directed. Please refer to the Current Medication list given to you today. If you need a refill on your cardiac medications before your next appointment, please call your pharmacy. Labwork: When you have labs (blood work) and your tests are completely normal, you will receive your results ONLY by [place] (if you have MyChart) -OR- A paper copy in the mail.  At [facility], you and your health needs are our priority.  As part of our continuing mission to provide you with exceptional heart care, we have created designated Provider Care Teams.  These Care Teams include your primary Cardiologist (physician) and Advanced Practice Providers (APPs -  Physician Assistants and Nurse Practitioners) who all work together to provide you with the care you need, when you need it.  Thank you for choosing [facility] at [facility]!!

## 2019-01-04 NOTE — Progress Notes (Signed)
Cardiology Office Note   Date:  01/04/2019   ID:  John Peck, DOB 1924-03-30, MRN [id number]  PCP:  Seward Carol, MD  Cardiologist:  Dr. Claiborne Billings  EP: Dr. Lovena Le  Chief Complaint  Patient presents with  . Coronary Artery Disease  . Hypertension  . Congestive Heart Failure     History of Present Illness: John Peck is a 85 y.o. male who presents for ongoing assessment and management of CAD, (s/p CABG 1989, DES to RCA 2017, DES to prox LCx 02/2017), PAF not on [facility] for GIB, SSS s/p St Jude PPM in place, HTN, CKD stage III, hypothyroidism, dementia, pleural effusion s/p thoracentesis 05/2018, iron deficiency anemia with pancytopenia of unknown cause, chronic combined systolic and diastolic heart failure, severe R carotid artery disease, LBBB, stroke, prior liver abscess, COPD, PAD/PVD, and HLD.  He has had several admissions for decompensated CHF. He was recently seen in the hospital on consultation by Dr. [place] Knack for chest pain. He wished to be treated medically, and refused repeat cardiac catheterization. He was to continue his current regimen but would have Ranxea started if his chest pain became more frequent.   He was seen again in the ED on 11/29/2018 for abdominal pain. Was admitted with AKI and CHF. He was noted to have seizures. He was treated for UTI. He was referred to Hospice due to declining and worsening health status.   He has had his medications adjusted by SNF physician, to include discontinuation of Zetia, and reduction of atorvastatin to 20 mg daily. His labs revealed that his lipids were low. TC 91, HDL 26, LDL 49, TG 78.  He is accompanied by his daughter who has multiple questions.   Past Medical History:  Diagnosis Date  . Anemia   . Arthritis   . CAD (coronary artery disease)    a. CABG '89; b. PCI '04; c. 11/2015 Cath/PCI: LM 20ost, LAD 137m, LCX 30p, OM2 40, RCA 30p, 10p ISR, 90d (3.0x12 Synergy DES), AM 90, VG->OM2 known to be 100, LIMA->LAD not injected,  patent in 2015.  . Carotid stenosis, right   . Chronic combined systolic and diastolic CHF (congestive heart failure) ([place])   . CKD (chronic kidney disease), stage III ([place])   . Dyspnea   . H/O: GI bleed    REMOTE HISTORY  . History of COPD   . Hyperlipidemia   . Hypertensive heart disease   . LBBB (left bundle branch block)   . Pacemaker Sept 2009   St Jude  . PAF (paroxysmal atrial fibrillation) (HCC)    a. not on anticoag due to prior GIB.  . Sick sinus syndrome [facility]) Sept 2009   ST Jude PTVDP  . Stroke [facility])     Past Surgical History:  Procedure Laterality Date  . CARDIAC CATHETERIZATION  11/2011   EF 50%; significant native CAD w/80% stenosis of the LAD after 2nd giagonal vessel and septal perforating artery w/total occlusion of the mid left anterior descendiung.; 60-70% ostiasl stenosis in the circumflex vessel followed by 40% proximal stenosis and 70-80% distal circumflex stenosis;   . CARDIAC CATHETERIZATION N/A 11/29/2015   Procedure: Left Heart Cath and Cors/Grafts Angiography;  Surgeon: Burnell Blanks, MD;  Location: [place] CV LAB;  Service: Cardiovascular;  Laterality: N/A;  . CARDIAC CATHETERIZATION N/A 11/29/2015   Procedure: Coronary Stent Intervention;  Surgeon: Burnell Blanks, MD;  Location: [place] CV LAB;  Service: Cardiovascular;  Laterality: N/A;  . CATARACT EXTRACTION  2004  .  CORONARY ANGIOGRAPHY N/A 02/25/2017   Procedure: Coronary Angiography;  Surgeon: Belva Crome, MD;  Location: [place] CV LAB;  Service: Cardiovascular;  Laterality: N/A;  . CORONARY ANGIOPLASTY WITH STENT PLACEMENT  2004   RCA  . CORONARY ARTERY BYPASS GRAFT  1989   had LIMA to his LAD, a vein to the circumflex. In 2004 underwent stenting to his right coronary artery.  . CORONARY STENT INTERVENTION N/A 02/24/2017   Procedure: Coronary Stent Intervention;  Surgeon: Wellington Hampshire, MD;  Location: [place] CV LAB;  Service: Cardiovascular;  Laterality: N/A;   cfx  . HEMORRHOID SURGERY    . INSERT / REPLACE / REMOVE PACEMAKER  07/17/08   DUAL-CHAMBER; PPM-ST.JUDE MEDNET  . IR THORACENTESIS ASP PLEURAL SPACE W/IMG GUIDE  05/25/2018  . IR THORACENTESIS ASP PLEURAL SPACE W/IMG GUIDE  08/01/2018  . LEFT HEART CATH AND CORS/GRAFTS ANGIOGRAPHY N/A 02/24/2017   Procedure: Left Heart Cath and Cors/Grafts Angiography;  Surgeon: Wellington Hampshire, MD;  Location: [place] CV LAB;  Service: Cardiovascular;  Laterality: N/A;  . LEFT HEART CATHETERIZATION WITH CORONARY/GRAFT ANGIOGRAM N/A 12/10/2011   Procedure: LEFT HEART CATHETERIZATION WITH Beatrix Fetters;  Surgeon: Troy Sine, MD;  Location: [facility] CATH LAB;  Service: Cardiovascular;  Laterality: N/A;  . LEFT HEART CATHETERIZATION WITH CORONARY/GRAFT ANGIOGRAM N/A 01/26/2014   Procedure: LEFT HEART CATHETERIZATION WITH Beatrix Fetters;  Surgeon: Troy Sine, MD;  Location: [facility] CATH LAB;  Service: Cardiovascular;  Laterality: N/A;  . PPM GENERATOR CHANGEOUT N/A 12/15/2017   Procedure: PPM GENERATOR CHANGEOUT;  Surgeon: Evans Lance, MD;  Location: [place] CV LAB;  Service: Cardiovascular;  Laterality: N/A;  . PROSTATECTOMY       Current Outpatient Medications  Medication Sig Dispense Refill  . acetaminophen (TYLENOL) 325 MG tablet Take 650 mg by mouth 3 (three) times daily.    Marland Kitchen albuterol (PROVENTIL HFA;VENTOLIN HFA) 108 (90 Base) MCG/ACT inhaler Inhale 2 puffs into the lungs every 4 (four) hours as needed for wheezing or shortness of breath (or coughing). 1 Inhaler 0  . aspirin 81 MG tablet Take 81 mg by mouth daily.     Marland Kitchen atorvastatin (LIPITOR) 20 MG tablet     . carvedilol (COREG) 12.5 MG tablet Take 1 tablet (12.5 mg total) by mouth 2 (two) times daily with a meal. 60 tablet 5  . clopidogrel (PLAVIX) 75 MG tablet Take 1 tablet (75 mg total) by mouth daily. 30 tablet 3  . desonide (DESOWEN) 0.05 % cream Apply 1 application topically 2 (two) times daily.    . divalproex (DEPAKOTE  SPRINKLES) 125 MG capsule Take 4 capsules (500 mg total) by mouth 2 (two) times daily for 30 days. 240 capsule 0  . feeding supplement, ENSURE ENLIVE, (ENSURE ENLIVE) LIQD Take 237 mLs by mouth 2 (two) times daily between meals. 237 mL 12  . ferrous sulfate 325 (65 FE) MG tablet Take 1 tablet (325 mg total) by mouth 2 (two) times daily with a meal. 60 tablet 3  . ipratropium-albuterol (DUONEB) 0.5-2.5 (3) MG/3ML SOLN Take 3 mLs by nebulization 2 (two) times daily. 360 mL 1  . isosorbide mononitrate (IMDUR) 30 MG 24 hr tablet Take 3 tablets (90 mg total) by mouth daily. 30 tablet 5  . latanoprost (XALATAN) 0.005 % ophthalmic solution Place 1 drop into both eyes 2 (two) times a week. On mondays and wednesdays  4  . levothyroxine (SYNTHROID, LEVOTHROID) 125 MCG tablet TAKE 1 TABLET BY MOUTH EVERY DAY BEFORE  BREAKFAST (Patient taking differently: Take 125 mcg by mouth daily before breakfast. ) 30 tablet 5  . pantoprazole (PROTONIX) 40 MG tablet Take 1 tablet (40 mg total) by mouth daily. 30 tablet 5  . polyethylene glycol (MIRALAX / GLYCOLAX) packet Take 17 g by mouth daily. 30 each 0  . senna (SENOKOT) 8.6 MG TABS tablet Take 1 tablet by mouth.    . shark liver oil-cocoa butter (PREPARATION H) 0.25-3-85.5 % suppository Place 1 suppository rectally as needed for hemorrhoids.    . tamsulosin (FLOMAX) 0.4 MG CAPS capsule Take 1 capsule (0.4 mg total) by mouth daily. 30 capsule 2  . traZODone (DESYREL) 50 MG tablet     . UNABLE TO FIND Lidocane cream 4%    . nitroGLYCERIN (NITROSTAT) 0.4 MG SL tablet Every 5 min x3 PRN chest pain (Patient not taking: Reported on 01/04/2019)  12   No current facility-administered medications for this visit.     Allergies:   Patient has no known allergies.    Social History:  The patient  reports that he quit smoking about 21 years ago. His smoking use included cigarettes. He has a 48.75 pack-year smoking history. He quit smokeless tobacco use about 10 years ago. He  reports that he does not drink alcohol or use drugs.   Family History:  The patient's family history includes Diabetes in his father; Heart attack in his brother; Heart disease in his brother and sister; Stroke in his brother and sister.    ROS: All other systems are reviewed and negative. Unless otherwise mentioned in H&P    PHYSICAL EXAM: VS:  BP 130/62   Pulse 61   Ht 5\' 9"  (1.753 m)   Wt 129 lb 3.2 oz (58.6 kg) Comment: 12/29/2018 at facility  SpO2 95%   BMI 19.08 kg/m  , BMI Body mass index is 19.08 kg/m. GEN: Well nourished, well developed, in no acute distress, sitting in a wheel chair. HEENT: normal Neck: no JVD, carotid bruits, or masses Cardiac: RRR; 2/6 systolic murmur, no rubs, or gallops,no edema  Respiratory:  Clear to auscultation bilaterally, normal work of breathing GI: soft, nontender, nondistended, + BS MS: no deformity or atrophy, atrophy of the LE, with weakness.  Skin: warm and dry, no rash Neuro:  Strength and sensation are intact Psych: euthymic mood, full affect   EKG:  Not completed this office visit.   Recent Labs: 08/09/2018: B Natriuretic Peptide 1,084.8 11/21/2018: TSH 1.859 11/29/2018: ALT 14; BUN 30; Creatinine, Ser 1.74; Hemoglobin 8.0; Magnesium 1.9; Platelets 152; Potassium 3.8; Sodium 139    Lipid Panel    Component Value Date/Time   CHOL 91 2018/12/22 0822   CHOL 128 07/28/2017 0813   TRIG 78 2018-12-22 0822   HDL 26 (L) 12/22/2018 0822   HDL 59 07/28/2017 0813   CHOLHDL 3.5 22-Dec-2018 0822   VLDL 16 12-22-18 0822   LDLCALC 49 12-22-2018 0822   LDLCALC 54 07/28/2017 0813   LDLDIRECT 55 12/10/2011 0806      Wt Readings from Last 3 Encounters:  01/04/19 129 lb 3.2 oz (58.6 kg)  11/29/18 160 lb (72.6 kg)  09/01/18 129 lb 10.1 oz (58.8 kg)      Other studies Reviewed: Echocardiogram 12/22/18 Left ventricle: Abnormal septal motion. The cavity size was   normal. Wall thickness was increased in a pattern of mild LVH.    Systolic function was normal. The estimated ejection fraction was   in the range of 55% to 60%. The study  is not technically   sufficient to allow evaluation of LV diastolic function. - Aortic valve: There was trivial regurgitation. - Mitral valve: There was mild regurgitation. - Left atrium: The atrium was mildly dilated. - Right atrium: The atrium was mildly dilated. - Atrial septum: No defect or patent foramen ovale was identified. - Pulmonary arteries: PA peak pressure: 50 mm Hg (S).  ASSESSMENT AND PLAN:  1. Chronic Diastolic CHF: He appears euvolemic on assessment today  He is not on diuretics at this time. No changes in his regimen.   2. CAD:  Due to multiple co-morbidities and age, continue medical management, without further ischemic testing.   3. AKI: Found in the setting of pseudomonas UTI. Labs are being followed.     4. Seizures; He has recently been placed on Depakote. Followed by PCP  5. Hypercholesterolemia: Changes in statin therapy per SNF physician.    Current medicines are reviewed at length with the patient today.  Multiple questions answered for daughter who accompanies him today. Copy of recent lipid profile provided.   Labs/ tests ordered today include: None  Phill Myron. [place] Pugh, ANP, AACC   01/04/2019 [facility] [address] Office [phone] Fax [phone]

## 2019-01-05 DIAGNOSIS — R278 Other lack of coordination: Secondary | ICD-10-CM | POA: Diagnosis not present

## 2019-01-05 DIAGNOSIS — R471 Dysarthria and anarthria: Secondary | ICD-10-CM | POA: Diagnosis not present

## 2019-01-05 DIAGNOSIS — M6281 Muscle weakness (generalized): Secondary | ICD-10-CM | POA: Diagnosis not present

## 2019-01-05 DIAGNOSIS — R2689 Other abnormalities of gait and mobility: Secondary | ICD-10-CM | POA: Diagnosis not present

## 2019-01-05 DIAGNOSIS — R2681 Unsteadiness on feet: Secondary | ICD-10-CM | POA: Diagnosis not present

## 2019-01-05 DIAGNOSIS — R41841 Cognitive communication deficit: Secondary | ICD-10-CM | POA: Diagnosis not present

## 2019-01-05 DIAGNOSIS — R1312 Dysphagia, oropharyngeal phase: Secondary | ICD-10-CM | POA: Diagnosis not present

## 2019-01-06 DIAGNOSIS — R41841 Cognitive communication deficit: Secondary | ICD-10-CM | POA: Diagnosis not present

## 2019-01-06 DIAGNOSIS — R1312 Dysphagia, oropharyngeal phase: Secondary | ICD-10-CM | POA: Diagnosis not present

## 2019-01-06 DIAGNOSIS — R471 Dysarthria and anarthria: Secondary | ICD-10-CM | POA: Diagnosis not present

## 2019-01-06 DIAGNOSIS — R278 Other lack of coordination: Secondary | ICD-10-CM | POA: Diagnosis not present

## 2019-01-06 DIAGNOSIS — R2689 Other abnormalities of gait and mobility: Secondary | ICD-10-CM | POA: Diagnosis not present

## 2019-01-06 DIAGNOSIS — R2681 Unsteadiness on feet: Secondary | ICD-10-CM | POA: Diagnosis not present

## 2019-01-06 DIAGNOSIS — M6281 Muscle weakness (generalized): Secondary | ICD-10-CM | POA: Diagnosis not present

## 2019-01-07 DIAGNOSIS — R278 Other lack of coordination: Secondary | ICD-10-CM | POA: Diagnosis not present

## 2019-01-07 DIAGNOSIS — R2681 Unsteadiness on feet: Secondary | ICD-10-CM | POA: Diagnosis not present

## 2019-01-07 DIAGNOSIS — M6281 Muscle weakness (generalized): Secondary | ICD-10-CM | POA: Diagnosis not present

## 2019-01-07 DIAGNOSIS — R471 Dysarthria and anarthria: Secondary | ICD-10-CM | POA: Diagnosis not present

## 2019-01-07 DIAGNOSIS — R41841 Cognitive communication deficit: Secondary | ICD-10-CM | POA: Diagnosis not present

## 2019-01-07 DIAGNOSIS — R1312 Dysphagia, oropharyngeal phase: Secondary | ICD-10-CM | POA: Diagnosis not present

## 2019-01-07 DIAGNOSIS — R2689 Other abnormalities of gait and mobility: Secondary | ICD-10-CM | POA: Diagnosis not present

## 2019-01-09 DIAGNOSIS — Z20828 Contact with and (suspected) exposure to other viral communicable diseases: Secondary | ICD-10-CM | POA: Diagnosis not present

## 2019-01-09 DIAGNOSIS — I5189 Other ill-defined heart diseases: Secondary | ICD-10-CM | POA: Diagnosis not present

## 2019-01-10 DIAGNOSIS — M6281 Muscle weakness (generalized): Secondary | ICD-10-CM | POA: Diagnosis not present

## 2019-01-10 DIAGNOSIS — R2681 Unsteadiness on feet: Secondary | ICD-10-CM | POA: Diagnosis not present

## 2019-01-10 DIAGNOSIS — J188 Other pneumonia, unspecified organism: Secondary | ICD-10-CM | POA: Diagnosis not present

## 2019-01-10 DIAGNOSIS — R05 Cough: Secondary | ICD-10-CM | POA: Diagnosis not present

## 2019-01-10 DIAGNOSIS — R2689 Other abnormalities of gait and mobility: Secondary | ICD-10-CM | POA: Diagnosis not present

## 2019-01-10 DIAGNOSIS — R471 Dysarthria and anarthria: Secondary | ICD-10-CM | POA: Diagnosis not present

## 2019-01-10 DIAGNOSIS — R1312 Dysphagia, oropharyngeal phase: Secondary | ICD-10-CM | POA: Diagnosis not present

## 2019-01-10 DIAGNOSIS — R278 Other lack of coordination: Secondary | ICD-10-CM | POA: Diagnosis not present

## 2019-01-10 DIAGNOSIS — K5901 Slow transit constipation: Secondary | ICD-10-CM | POA: Diagnosis not present

## 2019-01-10 DIAGNOSIS — R41841 Cognitive communication deficit: Secondary | ICD-10-CM | POA: Diagnosis not present

## 2019-01-11 DIAGNOSIS — J188 Other pneumonia, unspecified organism: Secondary | ICD-10-CM | POA: Diagnosis not present

## 2019-01-11 DIAGNOSIS — R278 Other lack of coordination: Secondary | ICD-10-CM | POA: Diagnosis not present

## 2019-01-11 DIAGNOSIS — M6281 Muscle weakness (generalized): Secondary | ICD-10-CM | POA: Diagnosis not present

## 2019-01-11 DIAGNOSIS — R2681 Unsteadiness on feet: Secondary | ICD-10-CM | POA: Diagnosis not present

## 2019-01-11 DIAGNOSIS — R41841 Cognitive communication deficit: Secondary | ICD-10-CM | POA: Diagnosis not present

## 2019-01-11 DIAGNOSIS — G6289 Other specified polyneuropathies: Secondary | ICD-10-CM | POA: Diagnosis not present

## 2019-01-11 DIAGNOSIS — R1312 Dysphagia, oropharyngeal phase: Secondary | ICD-10-CM | POA: Diagnosis not present

## 2019-01-11 DIAGNOSIS — R471 Dysarthria and anarthria: Secondary | ICD-10-CM | POA: Diagnosis not present

## 2019-01-11 DIAGNOSIS — R2689 Other abnormalities of gait and mobility: Secondary | ICD-10-CM | POA: Diagnosis not present

## 2019-01-12 DIAGNOSIS — R1903 Right lower quadrant abdominal swelling, mass and lump: Secondary | ICD-10-CM | POA: Diagnosis not present

## 2019-01-12 DIAGNOSIS — R2681 Unsteadiness on feet: Secondary | ICD-10-CM | POA: Diagnosis not present

## 2019-01-12 DIAGNOSIS — R278 Other lack of coordination: Secondary | ICD-10-CM | POA: Diagnosis not present

## 2019-01-12 DIAGNOSIS — R471 Dysarthria and anarthria: Secondary | ICD-10-CM | POA: Diagnosis not present

## 2019-01-12 DIAGNOSIS — R2689 Other abnormalities of gait and mobility: Secondary | ICD-10-CM | POA: Diagnosis not present

## 2019-01-12 DIAGNOSIS — R41841 Cognitive communication deficit: Secondary | ICD-10-CM | POA: Diagnosis not present

## 2019-01-12 DIAGNOSIS — G629 Polyneuropathy, unspecified: Secondary | ICD-10-CM | POA: Diagnosis not present

## 2019-01-12 DIAGNOSIS — R569 Unspecified convulsions: Secondary | ICD-10-CM | POA: Diagnosis not present

## 2019-01-12 DIAGNOSIS — I509 Heart failure, unspecified: Secondary | ICD-10-CM | POA: Diagnosis not present

## 2019-01-12 DIAGNOSIS — M6281 Muscle weakness (generalized): Secondary | ICD-10-CM | POA: Diagnosis not present

## 2019-01-12 DIAGNOSIS — R1312 Dysphagia, oropharyngeal phase: Secondary | ICD-10-CM | POA: Diagnosis not present

## 2019-01-13 DIAGNOSIS — R41841 Cognitive communication deficit: Secondary | ICD-10-CM | POA: Diagnosis not present

## 2019-01-13 DIAGNOSIS — R2689 Other abnormalities of gait and mobility: Secondary | ICD-10-CM | POA: Diagnosis not present

## 2019-01-13 DIAGNOSIS — R2681 Unsteadiness on feet: Secondary | ICD-10-CM | POA: Diagnosis not present

## 2019-01-13 DIAGNOSIS — R278 Other lack of coordination: Secondary | ICD-10-CM | POA: Diagnosis not present

## 2019-01-13 DIAGNOSIS — R1312 Dysphagia, oropharyngeal phase: Secondary | ICD-10-CM | POA: Diagnosis not present

## 2019-01-13 DIAGNOSIS — R471 Dysarthria and anarthria: Secondary | ICD-10-CM | POA: Diagnosis not present

## 2019-01-13 DIAGNOSIS — M6281 Muscle weakness (generalized): Secondary | ICD-10-CM | POA: Diagnosis not present

## 2019-01-13 DIAGNOSIS — K5909 Other constipation: Secondary | ICD-10-CM | POA: Diagnosis not present

## 2019-01-16 DIAGNOSIS — N35011 Post-traumatic bulbous urethral stricture: Secondary | ICD-10-CM | POA: Diagnosis not present

## 2019-01-16 DIAGNOSIS — R41841 Cognitive communication deficit: Secondary | ICD-10-CM | POA: Diagnosis not present

## 2019-01-16 DIAGNOSIS — R2681 Unsteadiness on feet: Secondary | ICD-10-CM | POA: Diagnosis not present

## 2019-01-16 DIAGNOSIS — R2689 Other abnormalities of gait and mobility: Secondary | ICD-10-CM | POA: Diagnosis not present

## 2019-01-16 DIAGNOSIS — N3946 Mixed incontinence: Secondary | ICD-10-CM | POA: Diagnosis not present

## 2019-01-16 DIAGNOSIS — N401 Enlarged prostate with lower urinary tract symptoms: Secondary | ICD-10-CM | POA: Diagnosis not present

## 2019-01-16 DIAGNOSIS — R471 Dysarthria and anarthria: Secondary | ICD-10-CM | POA: Diagnosis not present

## 2019-01-16 DIAGNOSIS — R1312 Dysphagia, oropharyngeal phase: Secondary | ICD-10-CM | POA: Diagnosis not present

## 2019-01-16 DIAGNOSIS — R278 Other lack of coordination: Secondary | ICD-10-CM | POA: Diagnosis not present

## 2019-01-16 DIAGNOSIS — M6281 Muscle weakness (generalized): Secondary | ICD-10-CM | POA: Diagnosis not present

## 2019-01-16 DIAGNOSIS — N281 Cyst of kidney, acquired: Secondary | ICD-10-CM | POA: Diagnosis not present

## 2019-01-17 DIAGNOSIS — J188 Other pneumonia, unspecified organism: Secondary | ICD-10-CM | POA: Diagnosis not present

## 2019-01-17 DIAGNOSIS — I10 Essential (primary) hypertension: Secondary | ICD-10-CM | POA: Diagnosis not present

## 2019-01-17 DIAGNOSIS — G6289 Other specified polyneuropathies: Secondary | ICD-10-CM | POA: Diagnosis not present

## 2019-01-17 DIAGNOSIS — R0981 Nasal congestion: Secondary | ICD-10-CM | POA: Diagnosis not present

## 2019-01-17 DIAGNOSIS — R2689 Other abnormalities of gait and mobility: Secondary | ICD-10-CM | POA: Diagnosis not present

## 2019-01-17 DIAGNOSIS — E7849 Other hyperlipidemia: Secondary | ICD-10-CM | POA: Diagnosis not present

## 2019-01-17 DIAGNOSIS — R1312 Dysphagia, oropharyngeal phase: Secondary | ICD-10-CM | POA: Diagnosis not present

## 2019-01-17 DIAGNOSIS — R278 Other lack of coordination: Secondary | ICD-10-CM | POA: Diagnosis not present

## 2019-01-17 DIAGNOSIS — R2681 Unsteadiness on feet: Secondary | ICD-10-CM | POA: Diagnosis not present

## 2019-01-17 DIAGNOSIS — J984 Other disorders of lung: Secondary | ICD-10-CM | POA: Diagnosis not present

## 2019-01-17 DIAGNOSIS — R41841 Cognitive communication deficit: Secondary | ICD-10-CM | POA: Diagnosis not present

## 2019-01-17 DIAGNOSIS — I5189 Other ill-defined heart diseases: Secondary | ICD-10-CM | POA: Diagnosis not present

## 2019-01-17 DIAGNOSIS — R471 Dysarthria and anarthria: Secondary | ICD-10-CM | POA: Diagnosis not present

## 2019-01-17 DIAGNOSIS — R6889 Other general symptoms and signs: Secondary | ICD-10-CM | POA: Diagnosis not present

## 2019-01-17 DIAGNOSIS — M6281 Muscle weakness (generalized): Secondary | ICD-10-CM | POA: Diagnosis not present

## 2019-01-18 DIAGNOSIS — R471 Dysarthria and anarthria: Secondary | ICD-10-CM | POA: Diagnosis not present

## 2019-01-18 DIAGNOSIS — M6281 Muscle weakness (generalized): Secondary | ICD-10-CM | POA: Diagnosis not present

## 2019-01-18 DIAGNOSIS — R1312 Dysphagia, oropharyngeal phase: Secondary | ICD-10-CM | POA: Diagnosis not present

## 2019-01-18 DIAGNOSIS — J188 Other pneumonia, unspecified organism: Secondary | ICD-10-CM | POA: Diagnosis not present

## 2019-01-18 DIAGNOSIS — R2681 Unsteadiness on feet: Secondary | ICD-10-CM | POA: Diagnosis not present

## 2019-01-18 DIAGNOSIS — J984 Other disorders of lung: Secondary | ICD-10-CM | POA: Diagnosis not present

## 2019-01-18 DIAGNOSIS — R2689 Other abnormalities of gait and mobility: Secondary | ICD-10-CM | POA: Diagnosis not present

## 2019-01-18 DIAGNOSIS — R41841 Cognitive communication deficit: Secondary | ICD-10-CM | POA: Diagnosis not present

## 2019-01-18 DIAGNOSIS — R278 Other lack of coordination: Secondary | ICD-10-CM | POA: Diagnosis not present

## 2019-01-18 DIAGNOSIS — I10 Essential (primary) hypertension: Secondary | ICD-10-CM | POA: Diagnosis not present

## 2019-01-19 DIAGNOSIS — M6281 Muscle weakness (generalized): Secondary | ICD-10-CM | POA: Diagnosis not present

## 2019-01-19 DIAGNOSIS — R2681 Unsteadiness on feet: Secondary | ICD-10-CM | POA: Diagnosis not present

## 2019-01-19 DIAGNOSIS — R1312 Dysphagia, oropharyngeal phase: Secondary | ICD-10-CM | POA: Diagnosis not present

## 2019-01-19 DIAGNOSIS — R471 Dysarthria and anarthria: Secondary | ICD-10-CM | POA: Diagnosis not present

## 2019-01-19 DIAGNOSIS — R278 Other lack of coordination: Secondary | ICD-10-CM | POA: Diagnosis not present

## 2019-01-19 DIAGNOSIS — R41841 Cognitive communication deficit: Secondary | ICD-10-CM | POA: Diagnosis not present

## 2019-01-19 DIAGNOSIS — R2689 Other abnormalities of gait and mobility: Secondary | ICD-10-CM | POA: Diagnosis not present

## 2019-01-20 DIAGNOSIS — R471 Dysarthria and anarthria: Secondary | ICD-10-CM | POA: Diagnosis not present

## 2019-01-20 DIAGNOSIS — R1312 Dysphagia, oropharyngeal phase: Secondary | ICD-10-CM | POA: Diagnosis not present

## 2019-01-20 DIAGNOSIS — R41841 Cognitive communication deficit: Secondary | ICD-10-CM | POA: Diagnosis not present

## 2019-01-20 DIAGNOSIS — R2689 Other abnormalities of gait and mobility: Secondary | ICD-10-CM | POA: Diagnosis not present

## 2019-01-20 DIAGNOSIS — R278 Other lack of coordination: Secondary | ICD-10-CM | POA: Diagnosis not present

## 2019-01-20 DIAGNOSIS — M6281 Muscle weakness (generalized): Secondary | ICD-10-CM | POA: Diagnosis not present

## 2019-01-20 DIAGNOSIS — R2681 Unsteadiness on feet: Secondary | ICD-10-CM | POA: Diagnosis not present

## 2019-01-23 DIAGNOSIS — R2681 Unsteadiness on feet: Secondary | ICD-10-CM | POA: Diagnosis not present

## 2019-01-23 DIAGNOSIS — R2689 Other abnormalities of gait and mobility: Secondary | ICD-10-CM | POA: Diagnosis not present

## 2019-01-23 DIAGNOSIS — R1312 Dysphagia, oropharyngeal phase: Secondary | ICD-10-CM | POA: Diagnosis not present

## 2019-01-23 DIAGNOSIS — R278 Other lack of coordination: Secondary | ICD-10-CM | POA: Diagnosis not present

## 2019-01-23 DIAGNOSIS — M6281 Muscle weakness (generalized): Secondary | ICD-10-CM | POA: Diagnosis not present

## 2019-01-23 DIAGNOSIS — R41841 Cognitive communication deficit: Secondary | ICD-10-CM | POA: Diagnosis not present

## 2019-01-23 DIAGNOSIS — R471 Dysarthria and anarthria: Secondary | ICD-10-CM | POA: Diagnosis not present

## 2019-01-24 DIAGNOSIS — R471 Dysarthria and anarthria: Secondary | ICD-10-CM | POA: Diagnosis not present

## 2019-01-24 DIAGNOSIS — R41841 Cognitive communication deficit: Secondary | ICD-10-CM | POA: Diagnosis not present

## 2019-01-24 DIAGNOSIS — R2681 Unsteadiness on feet: Secondary | ICD-10-CM | POA: Diagnosis not present

## 2019-01-24 DIAGNOSIS — M6281 Muscle weakness (generalized): Secondary | ICD-10-CM | POA: Diagnosis not present

## 2019-01-24 DIAGNOSIS — R2689 Other abnormalities of gait and mobility: Secondary | ICD-10-CM | POA: Diagnosis not present

## 2019-01-24 DIAGNOSIS — R1312 Dysphagia, oropharyngeal phase: Secondary | ICD-10-CM | POA: Diagnosis not present

## 2019-01-24 DIAGNOSIS — R278 Other lack of coordination: Secondary | ICD-10-CM | POA: Diagnosis not present

## 2019-01-25 DIAGNOSIS — R2689 Other abnormalities of gait and mobility: Secondary | ICD-10-CM | POA: Diagnosis not present

## 2019-01-25 DIAGNOSIS — N178 Other acute kidney failure: Secondary | ICD-10-CM | POA: Diagnosis not present

## 2019-01-25 DIAGNOSIS — N390 Urinary tract infection, site not specified: Secondary | ICD-10-CM | POA: Diagnosis not present

## 2019-01-25 DIAGNOSIS — D638 Anemia in other chronic diseases classified elsewhere: Secondary | ICD-10-CM | POA: Diagnosis not present

## 2019-01-25 DIAGNOSIS — R1312 Dysphagia, oropharyngeal phase: Secondary | ICD-10-CM | POA: Diagnosis not present

## 2019-01-25 DIAGNOSIS — I10 Essential (primary) hypertension: Secondary | ICD-10-CM | POA: Diagnosis not present

## 2019-01-25 DIAGNOSIS — M6281 Muscle weakness (generalized): Secondary | ICD-10-CM | POA: Diagnosis not present

## 2019-01-25 DIAGNOSIS — I480 Paroxysmal atrial fibrillation: Secondary | ICD-10-CM | POA: Diagnosis not present

## 2019-01-25 DIAGNOSIS — R278 Other lack of coordination: Secondary | ICD-10-CM | POA: Diagnosis not present

## 2019-01-25 DIAGNOSIS — R41841 Cognitive communication deficit: Secondary | ICD-10-CM | POA: Diagnosis not present

## 2019-01-25 DIAGNOSIS — J188 Other pneumonia, unspecified organism: Secondary | ICD-10-CM | POA: Diagnosis not present

## 2019-01-25 DIAGNOSIS — E7849 Other hyperlipidemia: Secondary | ICD-10-CM | POA: Diagnosis not present

## 2019-01-25 DIAGNOSIS — J984 Other disorders of lung: Secondary | ICD-10-CM | POA: Diagnosis not present

## 2019-01-25 DIAGNOSIS — R2681 Unsteadiness on feet: Secondary | ICD-10-CM | POA: Diagnosis not present

## 2019-01-25 DIAGNOSIS — I5189 Other ill-defined heart diseases: Secondary | ICD-10-CM | POA: Diagnosis not present

## 2019-01-25 DIAGNOSIS — R471 Dysarthria and anarthria: Secondary | ICD-10-CM | POA: Diagnosis not present

## 2019-01-26 DIAGNOSIS — N390 Urinary tract infection, site not specified: Secondary | ICD-10-CM | POA: Diagnosis not present

## 2019-01-26 DIAGNOSIS — R41841 Cognitive communication deficit: Secondary | ICD-10-CM | POA: Diagnosis not present

## 2019-01-26 DIAGNOSIS — R471 Dysarthria and anarthria: Secondary | ICD-10-CM | POA: Diagnosis not present

## 2019-01-26 DIAGNOSIS — R2689 Other abnormalities of gait and mobility: Secondary | ICD-10-CM | POA: Diagnosis not present

## 2019-01-26 DIAGNOSIS — R278 Other lack of coordination: Secondary | ICD-10-CM | POA: Diagnosis not present

## 2019-01-26 DIAGNOSIS — R1312 Dysphagia, oropharyngeal phase: Secondary | ICD-10-CM | POA: Diagnosis not present

## 2019-01-26 DIAGNOSIS — M6281 Muscle weakness (generalized): Secondary | ICD-10-CM | POA: Diagnosis not present

## 2019-01-26 DIAGNOSIS — R2681 Unsteadiness on feet: Secondary | ICD-10-CM | POA: Diagnosis not present

## 2019-01-27 DIAGNOSIS — R471 Dysarthria and anarthria: Secondary | ICD-10-CM | POA: Diagnosis not present

## 2019-01-27 DIAGNOSIS — R2681 Unsteadiness on feet: Secondary | ICD-10-CM | POA: Diagnosis not present

## 2019-01-27 DIAGNOSIS — J90 Pleural effusion, not elsewhere classified: Secondary | ICD-10-CM | POA: Diagnosis not present

## 2019-01-27 DIAGNOSIS — R278 Other lack of coordination: Secondary | ICD-10-CM | POA: Diagnosis not present

## 2019-01-27 DIAGNOSIS — M6281 Muscle weakness (generalized): Secondary | ICD-10-CM | POA: Diagnosis not present

## 2019-01-27 DIAGNOSIS — R2689 Other abnormalities of gait and mobility: Secondary | ICD-10-CM | POA: Diagnosis not present

## 2019-01-27 DIAGNOSIS — I5041 Acute combined systolic (congestive) and diastolic (congestive) heart failure: Secondary | ICD-10-CM | POA: Diagnosis not present

## 2019-01-27 DIAGNOSIS — R1312 Dysphagia, oropharyngeal phase: Secondary | ICD-10-CM | POA: Diagnosis not present

## 2019-01-27 DIAGNOSIS — R41841 Cognitive communication deficit: Secondary | ICD-10-CM | POA: Diagnosis not present

## 2019-01-27 DIAGNOSIS — R6889 Other general symptoms and signs: Secondary | ICD-10-CM | POA: Diagnosis not present

## 2019-01-30 DIAGNOSIS — R2689 Other abnormalities of gait and mobility: Secondary | ICD-10-CM | POA: Diagnosis not present

## 2019-01-30 DIAGNOSIS — M6281 Muscle weakness (generalized): Secondary | ICD-10-CM | POA: Diagnosis not present

## 2019-01-30 DIAGNOSIS — I5041 Acute combined systolic (congestive) and diastolic (congestive) heart failure: Secondary | ICD-10-CM | POA: Diagnosis not present

## 2019-01-30 DIAGNOSIS — R1312 Dysphagia, oropharyngeal phase: Secondary | ICD-10-CM | POA: Diagnosis not present

## 2019-01-30 DIAGNOSIS — R471 Dysarthria and anarthria: Secondary | ICD-10-CM | POA: Diagnosis not present

## 2019-01-30 DIAGNOSIS — R2681 Unsteadiness on feet: Secondary | ICD-10-CM | POA: Diagnosis not present

## 2019-01-30 DIAGNOSIS — R278 Other lack of coordination: Secondary | ICD-10-CM | POA: Diagnosis not present

## 2019-01-30 DIAGNOSIS — R41841 Cognitive communication deficit: Secondary | ICD-10-CM | POA: Diagnosis not present

## 2019-01-30 DIAGNOSIS — J188 Other pneumonia, unspecified organism: Secondary | ICD-10-CM | POA: Diagnosis not present

## 2019-01-30 DIAGNOSIS — R300 Dysuria: Secondary | ICD-10-CM | POA: Diagnosis not present

## 2019-01-31 ENCOUNTER — Ambulatory Visit (INDEPENDENT_AMBULATORY_CARE_PROVIDER_SITE_OTHER): Payer: Medicare HMO | Admitting: *Deleted

## 2019-01-31 ENCOUNTER — Other Ambulatory Visit: Payer: Self-pay

## 2019-01-31 DIAGNOSIS — M6281 Muscle weakness (generalized): Secondary | ICD-10-CM | POA: Diagnosis not present

## 2019-01-31 DIAGNOSIS — L304 Erythema intertrigo: Secondary | ICD-10-CM | POA: Diagnosis not present

## 2019-01-31 DIAGNOSIS — R41841 Cognitive communication deficit: Secondary | ICD-10-CM | POA: Diagnosis not present

## 2019-01-31 DIAGNOSIS — I495 Sick sinus syndrome: Secondary | ICD-10-CM

## 2019-01-31 DIAGNOSIS — R1312 Dysphagia, oropharyngeal phase: Secondary | ICD-10-CM | POA: Diagnosis not present

## 2019-01-31 DIAGNOSIS — R278 Other lack of coordination: Secondary | ICD-10-CM | POA: Diagnosis not present

## 2019-01-31 DIAGNOSIS — R2689 Other abnormalities of gait and mobility: Secondary | ICD-10-CM | POA: Diagnosis not present

## 2019-01-31 DIAGNOSIS — R05 Cough: Secondary | ICD-10-CM | POA: Diagnosis not present

## 2019-01-31 DIAGNOSIS — R6889 Other general symptoms and signs: Secondary | ICD-10-CM | POA: Diagnosis not present

## 2019-01-31 DIAGNOSIS — I5020 Unspecified systolic (congestive) heart failure: Secondary | ICD-10-CM | POA: Diagnosis not present

## 2019-01-31 DIAGNOSIS — R471 Dysarthria and anarthria: Secondary | ICD-10-CM | POA: Diagnosis not present

## 2019-01-31 DIAGNOSIS — R2681 Unsteadiness on feet: Secondary | ICD-10-CM | POA: Diagnosis not present

## 2019-02-01 ENCOUNTER — Telehealth: Payer: Self-pay

## 2019-02-01 ENCOUNTER — Telehealth: Payer: Self-pay | Admitting: Cardiology

## 2019-02-01 DIAGNOSIS — R05 Cough: Secondary | ICD-10-CM | POA: Diagnosis not present

## 2019-02-01 DIAGNOSIS — R2689 Other abnormalities of gait and mobility: Secondary | ICD-10-CM | POA: Diagnosis not present

## 2019-02-01 DIAGNOSIS — M6281 Muscle weakness (generalized): Secondary | ICD-10-CM | POA: Diagnosis not present

## 2019-02-01 DIAGNOSIS — R1312 Dysphagia, oropharyngeal phase: Secondary | ICD-10-CM | POA: Diagnosis not present

## 2019-02-01 DIAGNOSIS — R41841 Cognitive communication deficit: Secondary | ICD-10-CM | POA: Diagnosis not present

## 2019-02-01 DIAGNOSIS — R278 Other lack of coordination: Secondary | ICD-10-CM | POA: Diagnosis not present

## 2019-02-01 DIAGNOSIS — R471 Dysarthria and anarthria: Secondary | ICD-10-CM | POA: Diagnosis not present

## 2019-02-01 DIAGNOSIS — R2681 Unsteadiness on feet: Secondary | ICD-10-CM | POA: Diagnosis not present

## 2019-02-01 DIAGNOSIS — D638 Anemia in other chronic diseases classified elsewhere: Secondary | ICD-10-CM | POA: Diagnosis not present

## 2019-02-01 DIAGNOSIS — I5041 Acute combined systolic (congestive) and diastolic (congestive) heart failure: Secondary | ICD-10-CM | POA: Diagnosis not present

## 2019-02-01 DIAGNOSIS — L304 Erythema intertrigo: Secondary | ICD-10-CM | POA: Diagnosis not present

## 2019-02-01 DIAGNOSIS — N2889 Other specified disorders of kidney and ureter: Secondary | ICD-10-CM | POA: Diagnosis not present

## 2019-02-01 NOTE — Telephone Encounter (Signed)
Left message for patient to remind of missed remote transmission.  

## 2019-02-01 NOTE — Telephone Encounter (Signed)
Patient daughter called and requested that we call facility to give them instructions on how to send a manual transmission with the home monitor. After 2 unsuccessful attempts instructed pt nurse to call tech support. Pt nurse verbalized understanding.

## 2019-02-02 DIAGNOSIS — R471 Dysarthria and anarthria: Secondary | ICD-10-CM | POA: Diagnosis not present

## 2019-02-02 DIAGNOSIS — R41841 Cognitive communication deficit: Secondary | ICD-10-CM | POA: Diagnosis not present

## 2019-02-02 DIAGNOSIS — R1312 Dysphagia, oropharyngeal phase: Secondary | ICD-10-CM | POA: Diagnosis not present

## 2019-02-02 DIAGNOSIS — R2681 Unsteadiness on feet: Secondary | ICD-10-CM | POA: Diagnosis not present

## 2019-02-02 DIAGNOSIS — R278 Other lack of coordination: Secondary | ICD-10-CM | POA: Diagnosis not present

## 2019-02-02 DIAGNOSIS — D508 Other iron deficiency anemias: Secondary | ICD-10-CM | POA: Diagnosis not present

## 2019-02-02 DIAGNOSIS — R2689 Other abnormalities of gait and mobility: Secondary | ICD-10-CM | POA: Diagnosis not present

## 2019-02-02 DIAGNOSIS — M6281 Muscle weakness (generalized): Secondary | ICD-10-CM | POA: Diagnosis not present

## 2019-02-02 DIAGNOSIS — Z79899 Other long term (current) drug therapy: Secondary | ICD-10-CM | POA: Diagnosis not present

## 2019-02-02 LAB — CUP PACEART REMOTE DEVICE CHECK
Battery Remaining Percentage: 95.5 %
Battery Voltage: 3.01 V
Brady Statistic AP VP Percent: 40 %
Brady Statistic AP VS Percent: 47 %
Brady Statistic AS VS Percent: 8.9 %
Brady Statistic RA Percent Paced: 9.5 %
Date Time Interrogation Session: 20200325184429
Implantable Lead Implant Date: 20090908
Implantable Lead Location: 753859
Implantable Lead Location: 753860
Implantable Pulse Generator Implant Date: 20190206
Lead Channel Impedance Value: 350 Ohm
Lead Channel Impedance Value: 360 Ohm
Lead Channel Pacing Threshold Amplitude: 0.5 V
Lead Channel Pacing Threshold Amplitude: 0.875 V
Lead Channel Pacing Threshold Pulse Width: 0.4 ms
Lead Channel Pacing Threshold Pulse Width: 0.4 ms
Lead Channel Sensing Intrinsic Amplitude: 2.3 mV
Lead Channel Setting Pacing Amplitude: 1.125
Lead Channel Setting Pacing Pulse Width: 0.4 ms
Lead Channel Setting Sensing Sensitivity: 0.5 mV
MDC IDC LEAD IMPLANT DT: 20090908
MDC IDC MSMT BATTERY REMAINING LONGEVITY: 123 mo
MDC IDC MSMT LEADCHNL RV SENSING INTR AMPL: 5.9 mV
MDC IDC SET LEADCHNL RA PACING AMPLITUDE: 2.5 V
MDC IDC STAT BRADY AS VP PERCENT: 3.9 %
MDC IDC STAT BRADY RV PERCENT PACED: 90 %
Pulse Gen Model: 2272
Pulse Gen Serial Number: 8995118

## 2019-02-03 DIAGNOSIS — I5041 Acute combined systolic (congestive) and diastolic (congestive) heart failure: Secondary | ICD-10-CM | POA: Diagnosis not present

## 2019-02-03 DIAGNOSIS — R278 Other lack of coordination: Secondary | ICD-10-CM | POA: Diagnosis not present

## 2019-02-03 DIAGNOSIS — R2681 Unsteadiness on feet: Secondary | ICD-10-CM | POA: Diagnosis not present

## 2019-02-03 DIAGNOSIS — Z79899 Other long term (current) drug therapy: Secondary | ICD-10-CM | POA: Diagnosis not present

## 2019-02-03 DIAGNOSIS — R2689 Other abnormalities of gait and mobility: Secondary | ICD-10-CM | POA: Diagnosis not present

## 2019-02-03 DIAGNOSIS — R1312 Dysphagia, oropharyngeal phase: Secondary | ICD-10-CM | POA: Diagnosis not present

## 2019-02-03 DIAGNOSIS — R41841 Cognitive communication deficit: Secondary | ICD-10-CM | POA: Diagnosis not present

## 2019-02-03 DIAGNOSIS — D638 Anemia in other chronic diseases classified elsewhere: Secondary | ICD-10-CM | POA: Diagnosis not present

## 2019-02-03 DIAGNOSIS — M6281 Muscle weakness (generalized): Secondary | ICD-10-CM | POA: Diagnosis not present

## 2019-02-03 DIAGNOSIS — D508 Other iron deficiency anemias: Secondary | ICD-10-CM | POA: Diagnosis not present

## 2019-02-03 DIAGNOSIS — R471 Dysarthria and anarthria: Secondary | ICD-10-CM | POA: Diagnosis not present

## 2019-02-06 ENCOUNTER — Encounter: Payer: Self-pay | Admitting: Cardiology

## 2019-02-06 DIAGNOSIS — R5381 Other malaise: Secondary | ICD-10-CM | POA: Diagnosis not present

## 2019-02-06 DIAGNOSIS — G4700 Insomnia, unspecified: Secondary | ICD-10-CM | POA: Diagnosis not present

## 2019-02-06 DIAGNOSIS — D508 Other iron deficiency anemias: Secondary | ICD-10-CM | POA: Diagnosis not present

## 2019-02-06 DIAGNOSIS — R41841 Cognitive communication deficit: Secondary | ICD-10-CM | POA: Diagnosis not present

## 2019-02-06 DIAGNOSIS — J189 Pneumonia, unspecified organism: Secondary | ICD-10-CM | POA: Diagnosis not present

## 2019-02-06 DIAGNOSIS — R2689 Other abnormalities of gait and mobility: Secondary | ICD-10-CM | POA: Diagnosis not present

## 2019-02-06 DIAGNOSIS — Z79899 Other long term (current) drug therapy: Secondary | ICD-10-CM | POA: Diagnosis not present

## 2019-02-06 DIAGNOSIS — R1312 Dysphagia, oropharyngeal phase: Secondary | ICD-10-CM | POA: Diagnosis not present

## 2019-02-06 DIAGNOSIS — M6281 Muscle weakness (generalized): Secondary | ICD-10-CM | POA: Diagnosis not present

## 2019-02-06 DIAGNOSIS — R278 Other lack of coordination: Secondary | ICD-10-CM | POA: Diagnosis not present

## 2019-02-06 DIAGNOSIS — R2681 Unsteadiness on feet: Secondary | ICD-10-CM | POA: Diagnosis not present

## 2019-02-06 DIAGNOSIS — R471 Dysarthria and anarthria: Secondary | ICD-10-CM | POA: Diagnosis not present

## 2019-02-06 NOTE — Progress Notes (Signed)
Remote pacemaker transmission.   

## 2019-02-07 DIAGNOSIS — R41841 Cognitive communication deficit: Secondary | ICD-10-CM | POA: Diagnosis not present

## 2019-02-07 DIAGNOSIS — R2681 Unsteadiness on feet: Secondary | ICD-10-CM | POA: Diagnosis not present

## 2019-02-07 DIAGNOSIS — R2689 Other abnormalities of gait and mobility: Secondary | ICD-10-CM | POA: Diagnosis not present

## 2019-02-07 DIAGNOSIS — R471 Dysarthria and anarthria: Secondary | ICD-10-CM | POA: Diagnosis not present

## 2019-02-07 DIAGNOSIS — M6281 Muscle weakness (generalized): Secondary | ICD-10-CM | POA: Diagnosis not present

## 2019-02-07 DIAGNOSIS — R1312 Dysphagia, oropharyngeal phase: Secondary | ICD-10-CM | POA: Diagnosis not present

## 2019-02-07 DIAGNOSIS — R278 Other lack of coordination: Secondary | ICD-10-CM | POA: Diagnosis not present

## 2019-02-08 DIAGNOSIS — M6281 Muscle weakness (generalized): Secondary | ICD-10-CM | POA: Diagnosis not present

## 2019-02-08 DIAGNOSIS — R1312 Dysphagia, oropharyngeal phase: Secondary | ICD-10-CM | POA: Diagnosis not present

## 2019-02-08 DIAGNOSIS — R2681 Unsteadiness on feet: Secondary | ICD-10-CM | POA: Diagnosis not present

## 2019-02-08 DIAGNOSIS — R278 Other lack of coordination: Secondary | ICD-10-CM | POA: Diagnosis not present

## 2019-02-08 DIAGNOSIS — R41841 Cognitive communication deficit: Secondary | ICD-10-CM | POA: Diagnosis not present

## 2019-02-08 DIAGNOSIS — R471 Dysarthria and anarthria: Secondary | ICD-10-CM | POA: Diagnosis not present

## 2019-02-08 DIAGNOSIS — R2689 Other abnormalities of gait and mobility: Secondary | ICD-10-CM | POA: Diagnosis not present

## 2019-02-09 DIAGNOSIS — R471 Dysarthria and anarthria: Secondary | ICD-10-CM | POA: Diagnosis not present

## 2019-02-09 DIAGNOSIS — R278 Other lack of coordination: Secondary | ICD-10-CM | POA: Diagnosis not present

## 2019-02-09 DIAGNOSIS — R2681 Unsteadiness on feet: Secondary | ICD-10-CM | POA: Diagnosis not present

## 2019-02-09 DIAGNOSIS — R1312 Dysphagia, oropharyngeal phase: Secondary | ICD-10-CM | POA: Diagnosis not present

## 2019-02-09 DIAGNOSIS — R41841 Cognitive communication deficit: Secondary | ICD-10-CM | POA: Diagnosis not present

## 2019-02-09 DIAGNOSIS — M6281 Muscle weakness (generalized): Secondary | ICD-10-CM | POA: Diagnosis not present

## 2019-02-09 DIAGNOSIS — R2689 Other abnormalities of gait and mobility: Secondary | ICD-10-CM | POA: Diagnosis not present

## 2019-02-10 ENCOUNTER — Encounter (HOSPITAL_COMMUNITY): Payer: Self-pay

## 2019-02-10 ENCOUNTER — Other Ambulatory Visit: Payer: Self-pay

## 2019-02-10 ENCOUNTER — Inpatient Hospital Stay (HOSPITAL_COMMUNITY)
Admission: EM | Admit: 2019-02-10 | Discharge: 2019-02-17 | DRG: 378 | Disposition: A | Discharge status: Skilled Nursing Facility | Payer: Medicare HMO | Attending: Family Medicine | Admitting: Family Medicine

## 2019-02-10 DIAGNOSIS — R471 Dysarthria and anarthria: Secondary | ICD-10-CM | POA: Diagnosis not present

## 2019-02-10 DIAGNOSIS — I5032 Chronic diastolic (congestive) heart failure: Secondary | ICD-10-CM | POA: Diagnosis not present

## 2019-02-10 DIAGNOSIS — F0390 Unspecified dementia without behavioral disturbance: Secondary | ICD-10-CM | POA: Diagnosis present

## 2019-02-10 DIAGNOSIS — R2681 Unsteadiness on feet: Secondary | ICD-10-CM | POA: Diagnosis not present

## 2019-02-10 DIAGNOSIS — I5022 Chronic systolic (congestive) heart failure: Secondary | ICD-10-CM | POA: Diagnosis present

## 2019-02-10 DIAGNOSIS — I2510 Atherosclerotic heart disease of native coronary artery without angina pectoris: Secondary | ICD-10-CM | POA: Diagnosis present

## 2019-02-10 DIAGNOSIS — K649 Unspecified hemorrhoids: Secondary | ICD-10-CM | POA: Diagnosis not present

## 2019-02-10 DIAGNOSIS — N183 Chronic kidney disease, stage 3 unspecified: Secondary | ICD-10-CM | POA: Diagnosis present

## 2019-02-10 DIAGNOSIS — I495 Sick sinus syndrome: Secondary | ICD-10-CM | POA: Diagnosis not present

## 2019-02-10 DIAGNOSIS — Z87891 Personal history of nicotine dependence: Secondary | ICD-10-CM

## 2019-02-10 DIAGNOSIS — Z955 Presence of coronary angioplasty implant and graft: Secondary | ICD-10-CM | POA: Diagnosis not present

## 2019-02-10 DIAGNOSIS — Z79890 Hormone replacement therapy (postmenopausal): Secondary | ICD-10-CM

## 2019-02-10 DIAGNOSIS — H9190 Unspecified hearing loss, unspecified ear: Secondary | ICD-10-CM | POA: Diagnosis present

## 2019-02-10 DIAGNOSIS — Z8673 Personal history of transient ischemic attack (TIA), and cerebral infarction without residual deficits: Secondary | ICD-10-CM

## 2019-02-10 DIAGNOSIS — Z8249 Family history of ischemic heart disease and other diseases of the circulatory system: Secondary | ICD-10-CM

## 2019-02-10 DIAGNOSIS — R1030 Lower abdominal pain, unspecified: Secondary | ICD-10-CM | POA: Diagnosis not present

## 2019-02-10 DIAGNOSIS — D500 Iron deficiency anemia secondary to blood loss (chronic): Secondary | ICD-10-CM | POA: Diagnosis not present

## 2019-02-10 DIAGNOSIS — R0602 Shortness of breath: Secondary | ICD-10-CM | POA: Diagnosis not present

## 2019-02-10 DIAGNOSIS — Z950 Presence of cardiac pacemaker: Secondary | ICD-10-CM | POA: Diagnosis not present

## 2019-02-10 DIAGNOSIS — K625 Hemorrhage of anus and rectum: Secondary | ICD-10-CM

## 2019-02-10 DIAGNOSIS — D62 Acute posthemorrhagic anemia: Secondary | ICD-10-CM | POA: Diagnosis present

## 2019-02-10 DIAGNOSIS — I480 Paroxysmal atrial fibrillation: Secondary | ICD-10-CM | POA: Diagnosis present

## 2019-02-10 DIAGNOSIS — I447 Left bundle-branch block, unspecified: Secondary | ICD-10-CM | POA: Diagnosis present

## 2019-02-10 DIAGNOSIS — R278 Other lack of coordination: Secondary | ICD-10-CM | POA: Diagnosis not present

## 2019-02-10 DIAGNOSIS — Z7982 Long term (current) use of aspirin: Secondary | ICD-10-CM

## 2019-02-10 DIAGNOSIS — N400 Enlarged prostate without lower urinary tract symptoms: Secondary | ICD-10-CM | POA: Diagnosis present

## 2019-02-10 DIAGNOSIS — R1312 Dysphagia, oropharyngeal phase: Secondary | ICD-10-CM | POA: Diagnosis not present

## 2019-02-10 DIAGNOSIS — D6489 Other specified anemias: Secondary | ICD-10-CM | POA: Diagnosis not present

## 2019-02-10 DIAGNOSIS — I10 Essential (primary) hypertension: Secondary | ICD-10-CM | POA: Diagnosis not present

## 2019-02-10 DIAGNOSIS — D631 Anemia in chronic kidney disease: Secondary | ICD-10-CM | POA: Diagnosis not present

## 2019-02-10 DIAGNOSIS — Z79899 Other long term (current) drug therapy: Secondary | ICD-10-CM

## 2019-02-10 DIAGNOSIS — R0989 Other specified symptoms and signs involving the circulatory and respiratory systems: Secondary | ICD-10-CM | POA: Diagnosis not present

## 2019-02-10 DIAGNOSIS — E785 Hyperlipidemia, unspecified: Secondary | ICD-10-CM | POA: Diagnosis not present

## 2019-02-10 DIAGNOSIS — Z951 Presence of aortocoronary bypass graft: Secondary | ICD-10-CM

## 2019-02-10 DIAGNOSIS — I130 Hypertensive heart and chronic kidney disease with heart failure and stage 1 through stage 4 chronic kidney disease, or unspecified chronic kidney disease: Secondary | ICD-10-CM | POA: Diagnosis present

## 2019-02-10 DIAGNOSIS — E039 Hypothyroidism, unspecified: Secondary | ICD-10-CM | POA: Diagnosis present

## 2019-02-10 DIAGNOSIS — K5731 Diverticulosis of large intestine without perforation or abscess with bleeding: Secondary | ICD-10-CM | POA: Diagnosis not present

## 2019-02-10 DIAGNOSIS — M2550 Pain in unspecified joint: Secondary | ICD-10-CM | POA: Diagnosis not present

## 2019-02-10 DIAGNOSIS — R69 Illness, unspecified: Secondary | ICD-10-CM | POA: Diagnosis not present

## 2019-02-10 DIAGNOSIS — K219 Gastro-esophageal reflux disease without esophagitis: Secondary | ICD-10-CM | POA: Diagnosis present

## 2019-02-10 DIAGNOSIS — K922 Gastrointestinal hemorrhage, unspecified: Secondary | ICD-10-CM | POA: Diagnosis present

## 2019-02-10 DIAGNOSIS — R531 Weakness: Secondary | ICD-10-CM | POA: Diagnosis not present

## 2019-02-10 DIAGNOSIS — Z823 Family history of stroke: Secondary | ICD-10-CM

## 2019-02-10 DIAGNOSIS — J449 Chronic obstructive pulmonary disease, unspecified: Secondary | ICD-10-CM | POA: Diagnosis not present

## 2019-02-10 DIAGNOSIS — I5042 Chronic combined systolic (congestive) and diastolic (congestive) heart failure: Secondary | ICD-10-CM | POA: Diagnosis present

## 2019-02-10 DIAGNOSIS — I5041 Acute combined systolic (congestive) and diastolic (congestive) heart failure: Secondary | ICD-10-CM | POA: Diagnosis not present

## 2019-02-10 DIAGNOSIS — Z7401 Bed confinement status: Secondary | ICD-10-CM | POA: Diagnosis not present

## 2019-02-10 DIAGNOSIS — R58 Hemorrhage, not elsewhere classified: Secondary | ICD-10-CM | POA: Diagnosis not present

## 2019-02-10 DIAGNOSIS — Z7902 Long term (current) use of antithrombotics/antiplatelets: Secondary | ICD-10-CM

## 2019-02-10 DIAGNOSIS — K921 Melena: Secondary | ICD-10-CM | POA: Diagnosis not present

## 2019-02-10 DIAGNOSIS — Z66 Do not resuscitate: Secondary | ICD-10-CM | POA: Diagnosis not present

## 2019-02-10 DIAGNOSIS — R41841 Cognitive communication deficit: Secondary | ICD-10-CM | POA: Diagnosis not present

## 2019-02-10 DIAGNOSIS — M6281 Muscle weakness (generalized): Secondary | ICD-10-CM | POA: Diagnosis not present

## 2019-02-10 DIAGNOSIS — R2689 Other abnormalities of gait and mobility: Secondary | ICD-10-CM | POA: Diagnosis not present

## 2019-02-10 LAB — CBC WITH DIFFERENTIAL/PLATELET
Abs Immature Granulocytes: 0.02 10*3/uL (ref 0.00–0.07)
Basophils Absolute: 0 10*3/uL (ref 0.0–0.1)
Basophils Relative: 0 %
Eosinophils Absolute: 0.2 10*3/uL (ref 0.0–0.5)
Eosinophils Relative: 3 %
HCT: 20.9 % — ABNORMAL LOW (ref 39.0–52.0)
Hemoglobin: 6.4 g/dL — CL (ref 13.0–17.0)
Immature Granulocytes: 0 %
Lymphocytes Relative: 12 %
Lymphs Abs: 0.6 10*3/uL — ABNORMAL LOW (ref 0.7–4.0)
MCH: 32.2 pg (ref 26.0–34.0)
MCHC: 30.6 g/dL (ref 30.0–36.0)
MCV: 105 fL — ABNORMAL HIGH (ref 80.0–100.0)
Monocytes Absolute: 0.6 10*3/uL (ref 0.1–1.0)
Monocytes Relative: 13 %
Neutro Abs: 3.5 10*3/uL (ref 1.7–7.7)
Neutrophils Relative %: 72 %
Platelets: 165 10*3/uL (ref 150–400)
RBC: 1.99 MIL/uL — ABNORMAL LOW (ref 4.22–5.81)
RDW: 17.6 % — ABNORMAL HIGH (ref 11.5–15.5)
WBC: 4.9 10*3/uL (ref 4.0–10.5)
nRBC: 0 % (ref 0.0–0.2)

## 2019-02-10 LAB — COMPREHENSIVE METABOLIC PANEL
ALT: 14 U/L (ref 0–44)
AST: 14 U/L — ABNORMAL LOW (ref 15–41)
Albumin: 2.3 g/dL — ABNORMAL LOW (ref 3.5–5.0)
Alkaline Phosphatase: 46 U/L (ref 38–126)
Anion gap: 6 (ref 5–15)
BUN: 40 mg/dL — ABNORMAL HIGH (ref 8–23)
CO2: 22 mmol/L (ref 22–32)
Calcium: 7.9 mg/dL — ABNORMAL LOW (ref 8.9–10.3)
Chloride: 112 mmol/L — ABNORMAL HIGH (ref 98–111)
Creatinine, Ser: 1.85 mg/dL — ABNORMAL HIGH (ref 0.61–1.24)
GFR calc Af Amer: 36 mL/min — ABNORMAL LOW (ref >60)
GFR calc non Af Amer: 31 mL/min — ABNORMAL LOW (ref >60)
Glucose, Bld: 124 mg/dL — ABNORMAL HIGH (ref 70–99)
Potassium: 4.5 mmol/L (ref 3.5–5.1)
Sodium: 140 mmol/L (ref 135–145)
Total Bilirubin: 0.4 mg/dL (ref 0.3–1.2)
Total Protein: 6.3 g/dL — ABNORMAL LOW (ref 6.5–8.1)

## 2019-02-10 LAB — POC OCCULT BLOOD, ED: Fecal Occult Bld: NEGATIVE

## 2019-02-10 LAB — PREPARE RBC (CROSSMATCH)

## 2019-02-10 LAB — PROTIME-INR
INR: 1.5 — ABNORMAL HIGH (ref 0.8–1.2)
Prothrombin Time: 17.5 seconds — ABNORMAL HIGH (ref 11.4–15.2)

## 2019-02-10 MED ORDER — TAMSULOSIN HCL 0.4 MG PO CAPS
0.4000 mg | ORAL_CAPSULE | Freq: Every day | ORAL | Status: DC
  Administered 2019-02-12 – 2019-02-17 (×6): 0.4 mg via ORAL
  Filled 2019-02-10 (×6): qty 1

## 2019-02-10 MED ORDER — ACETAMINOPHEN 325 MG PO TABS
650.0000 mg | ORAL_TABLET | Freq: Four times a day (QID) | ORAL | Status: DC | PRN
  Administered 2019-02-13 – 2019-02-17 (×8): 650 mg via ORAL
  Filled 2019-02-10 (×8): qty 2

## 2019-02-10 MED ORDER — SODIUM CHLORIDE 0.9 % IV SOLN
10.0000 mL/h | Freq: Once | INTRAVENOUS | Status: AC
  Administered 2019-02-10: 10 mL/h via INTRAVENOUS

## 2019-02-10 MED ORDER — ATORVASTATIN CALCIUM 20 MG PO TABS
20.0000 mg | ORAL_TABLET | Freq: Every day | ORAL | Status: DC
  Administered 2019-02-11 – 2019-02-16 (×6): 20 mg via ORAL
  Filled 2019-02-10 (×6): qty 1

## 2019-02-10 MED ORDER — CARVEDILOL 12.5 MG PO TABS: 12.5000 mg | ORAL_TABLET | Freq: Two times a day (BID) | ORAL | Status: DC

## 2019-02-10 MED ORDER — LEVOTHYROXINE SODIUM 125 MCG PO TABS
125.0000 ug | ORAL_TABLET | Freq: Every day | ORAL | Status: DC
  Administered 2019-02-12 – 2019-02-17 (×6): 125 ug via ORAL
  Filled 2019-02-10 (×7): qty 1

## 2019-02-10 MED ORDER — VITAMIN C 500 MG PO TABS
500.0000 mg | ORAL_TABLET | Freq: Every day | ORAL | Status: DC
  Administered 2019-02-12 – 2019-02-17 (×6): 500 mg via ORAL
  Filled 2019-02-10 (×6): qty 1

## 2019-02-10 MED ORDER — PANTOPRAZOLE SODIUM 40 MG PO TBEC
40.0000 mg | DELAYED_RELEASE_TABLET | Freq: Every day | ORAL | Status: DC
  Administered 2019-02-12 – 2019-02-17 (×6): 40 mg via ORAL
  Filled 2019-02-10 (×6): qty 1

## 2019-02-10 MED ORDER — TRAZODONE HCL 50 MG PO TABS
150.0000 mg | ORAL_TABLET | Freq: Every day | ORAL | Status: DC
  Administered 2019-02-11 – 2019-02-16 (×6): 150 mg via ORAL
  Filled 2019-02-10 (×7): qty 1

## 2019-02-10 MED ORDER — CARVEDILOL 3.125 MG PO TABS
3.1250 mg | ORAL_TABLET | Freq: Two times a day (BID) | ORAL | Status: DC
  Administered 2019-02-11 – 2019-02-17 (×12): 3.125 mg via ORAL
  Filled 2019-02-10 (×12): qty 1

## 2019-02-10 MED ORDER — ONDANSETRON HCL 4 MG/2ML IJ SOLN: 4.0000 mg | Freq: Four times a day (QID) | INTRAMUSCULAR | Status: DC | PRN

## 2019-02-10 NOTE — Progress Notes (Signed)
Patient from [facility].  Per daughter/HCPOA patient takes pills crushed in AS and is on a mechanical soft diet at facility.  He is able to do transfers and walks very short distances with walker and a chair behind him.  Blood transfusion is finished.  Patient had 1 bloody BM with clots on arrival from the ED.  Patient is alert and oriented x4 but very HOH.  Patient wants to eat.  Placed on telemetry and confirmed with CMT.  Patient's daughter updated.  Patient oriented to unit and equipment.  Will continue to monitor.

## 2019-02-10 NOTE — ED Notes (Signed)
Date and time results received: 02/10/19  (use smartphrase ".now" to insert current time)  Test: HGB Critical Value: 6.4  Name of Provider Notified: GOLDSTON  Orders Received AWAITING RESPONSE

## 2019-02-10 NOTE — ED Notes (Signed)
EDP GOLDSTON EVALUATED. PT NOT ACTIVELY BLEEDING.

## 2019-02-10 NOTE — Plan of Care (Signed)
  Problem: Bowel/Gastric: Goal: Will show no signs and symptoms of gastrointestinal bleeding Outcome: Progressing   

## 2019-02-10 NOTE — ED Notes (Signed)
ADMITTING Provider at bedside. 

## 2019-02-10 NOTE — ED Notes (Signed)
EDP John Peck CONTINUES TO CALL DAUGHTER TO ENSURE MOST FORM FOR PT IS FOLLOWED PER PT AND FAMILY'S DESIRED PLAN OF CARE.

## 2019-02-10 NOTE — ED Notes (Signed)
PT CONTINUES WITH FIRST TRANSFUSION. PT ALERT AND ACTIVE WITH CARE.  PT IS HOH PT IS A DNR.  PT HAS MOST FORM

## 2019-02-10 NOTE — H&P (Signed)
History and Physical  John Peck EQA:[id number] DOB: 1923-11-15 DOA: 02/10/2019  Referring physician: Dr Regenia Peck  PCP: John Carol, MD  Outpatient Specialists: None Patient coming from: [name])  Chief Complaint: Rectal bleed of 1 day duration  HPI: John Peck is a 85 y.o. male with medical history significant for dementia, coronary artery disease, paroxysmal A. fib not on oral anticoagulation, sick sinus syndrome status post pacemaker placement, chronic diastolic CHF, iron deficiency anemia, CKD 3, who presented to [facility] ED from SNF with rectal bleed of 1 day duration.  History is obtained mainly from ED provider, medical records, patient's daughter and wife on the phone.  Patient resides at [facility].  Had one rectal bleed last night and again this morning.  He was transferred to the ED for further evaluation.  No other complaints.  Has had a colonoscopy many years ago with Velora Heckler, family is unsure if there were any abnormal findings.  Patient is unable to provide a history due to dementia.  No abdominal pain or tenderness noted on exam.  ED Course: Presented with hemoglobin down to 6.4 from baseline around 8.5 and negative FOBT.  Vital signs stable.  1 unit ordered to be transfused in the ED.  TRH asked to admit for presumed GI bleed.  GI consulted by ED physician.  Review of Systems: Review of systems as noted in the HPI. All other systems reviewed and are negative.   Past Medical History:  Diagnosis Date  . Anemia   . Arthritis   . CAD (coronary artery disease)    a. CABG '89; b. PCI '04; c. 11/2015 Cath/PCI: LM 20ost, LAD 160m, LCX 30p, OM2 40, RCA 30p, 10p ISR, 90d (3.0x12 Synergy DES), AM 90, VG->OM2 known to be 100, LIMA->LAD not injected, patent in 2015.  . Carotid stenosis, right   . Chronic combined systolic and diastolic CHF (congestive heart failure) ([place])   . CKD (chronic kidney disease), stage III ([place])   . Dyspnea   . H/O: GI bleed    REMOTE HISTORY   . History of COPD   . Hyperlipidemia   . Hypertensive heart disease   . LBBB (left bundle branch block)   . Pacemaker Sept 2009   St Jude  . PAF (paroxysmal atrial fibrillation) (HCC)    a. not on anticoag due to prior GIB.  . Sick sinus syndrome [facility]) Sept 2009   ST Jude PTVDP  . Stroke [facility])    Past Surgical History:  Procedure Laterality Date  . CARDIAC CATHETERIZATION  11/2011   EF 50%; significant native CAD w/80% stenosis of the LAD after 2nd giagonal vessel and septal perforating artery w/total occlusion of the mid left anterior descendiung.; 60-70% ostiasl stenosis in the circumflex vessel followed by 40% proximal stenosis and 70-80% distal circumflex stenosis;   . CARDIAC CATHETERIZATION N/A 11/29/2015   Procedure: Left Heart Cath and Cors/Grafts Angiography;  Surgeon: Burnell Blanks, MD;  Location: [place] CV LAB;  Service: Cardiovascular;  Laterality: N/A;  . CARDIAC CATHETERIZATION N/A 11/29/2015   Procedure: Coronary Stent Intervention;  Surgeon: Burnell Blanks, MD;  Location: [place] CV LAB;  Service: Cardiovascular;  Laterality: N/A;  . CATARACT EXTRACTION  2004  . CORONARY ANGIOGRAPHY N/A 02/25/2017   Procedure: Coronary Angiography;  Surgeon: Belva Crome, MD;  Location: [place] CV LAB;  Service: Cardiovascular;  Laterality: N/A;  . CORONARY ANGIOPLASTY WITH STENT PLACEMENT  2004   RCA  . CORONARY ARTERY BYPASS GRAFT  1989   had LIMA to his LAD, a vein to the circumflex. In 2004 underwent stenting to his right coronary artery.  . CORONARY STENT INTERVENTION N/A 02/24/2017   Procedure: Coronary Stent Intervention;  Surgeon: Wellington Hampshire, MD;  Location: [place] CV LAB;  Service: Cardiovascular;  Laterality: N/A;  cfx  . HEMORRHOID SURGERY    . INSERT / REPLACE / REMOVE PACEMAKER  07/17/08   DUAL-CHAMBER; PPM-ST.JUDE MEDNET  . IR THORACENTESIS ASP PLEURAL SPACE W/IMG GUIDE  05/25/2018  . IR THORACENTESIS ASP PLEURAL SPACE W/IMG GUIDE   08/01/2018  . LEFT HEART CATH AND CORS/GRAFTS ANGIOGRAPHY N/A 02/24/2017   Procedure: Left Heart Cath and Cors/Grafts Angiography;  Surgeon: Wellington Hampshire, MD;  Location: [place] CV LAB;  Service: Cardiovascular;  Laterality: N/A;  . LEFT HEART CATHETERIZATION WITH CORONARY/GRAFT ANGIOGRAM N/A 12/10/2011   Procedure: LEFT HEART CATHETERIZATION WITH Beatrix Fetters;  Surgeon: Troy Sine, MD;  Location: [facility] CATH LAB;  Service: Cardiovascular;  Laterality: N/A;  . LEFT HEART CATHETERIZATION WITH CORONARY/GRAFT ANGIOGRAM N/A 01/26/2014   Procedure: LEFT HEART CATHETERIZATION WITH Beatrix Fetters;  Surgeon: Troy Sine, MD;  Location: [facility] CATH LAB;  Service: Cardiovascular;  Laterality: N/A;  . PPM GENERATOR CHANGEOUT N/A 12/15/2017   Procedure: PPM GENERATOR CHANGEOUT;  Surgeon: Evans Lance, MD;  Location: [place] CV LAB;  Service: Cardiovascular;  Laterality: N/A;  . PROSTATECTOMY      Social History:  reports that he quit smoking about 21 years ago. His smoking use included cigarettes. He has a 48.75 pack-year smoking history. He quit smokeless tobacco use about 10 years ago. He reports that he does not drink alcohol or use drugs.   No Known Allergies  Family History  Problem Relation Age of Onset  . Diabetes Father   . Stroke Sister   . Heart disease Sister   . Heart disease Brother   . Heart attack Brother   . Stroke Brother   . Hypertension Neg Hx      Prior to Admission medications   Medication Sig Start Date End Date Taking? Authorizing Provider  acetaminophen (TYLENOL) 325 MG tablet Take 650 mg by mouth 4 (four) times daily.    Yes [provider]  albuterol (PROVENTIL HFA;VENTOLIN HFA) 108 (90 Base) MCG/ACT inhaler Inhale 2 puffs into the lungs every 4 (four) hours as needed for wheezing or shortness of breath (or coughing). 05/27/18  Yes Emokpae, Courage, MD  aspirin 81 MG tablet Take 81 mg by mouth daily.    Yes [provider]   atorvastatin (LIPITOR) 20 MG tablet Take 20 mg by mouth daily at 6 PM.  01/01/19  Yes [provider]  CAMPHOR-EUCALYPTUS-MENTHOL EX Apply 1 application topically as needed (congestion). Every 1 hour prn under nasal and chest   Yes [provider]  carvedilol (COREG) 12.5 MG tablet Take 1 tablet (12.5 mg total) by mouth 2 (two) times daily with a meal. 05/27/18  Yes Emokpae, Courage, MD  clopidogrel (PLAVIX) 75 MG tablet Take 1 tablet (75 mg total) by mouth daily. 05/27/18  Yes Emokpae, Courage, MD  desonide (DESOWEN) 0.05 % cream Apply 1 application topically 2 (two) times daily. Both sides of the neck   Yes [provider]  ferrous sulfate 325 (65 FE) MG tablet Take 1 tablet (325 mg total) by mouth 2 (two) times daily with a meal. Patient taking differently: Take 325 mg by mouth daily with breakfast.  05/27/18  Yes Roxan Hockey, MD  folic acid (FOLVITE) 1 MG tablet Take 1 mg by mouth daily.   Yes [provider]  ipratropium-albuterol (DUONEB) 0.5-2.5 (3) MG/3ML SOLN Take 3 mLs by nebulization 2 (two) times daily. 02/24/18  Yes Mikhail, Velta Addison, DO  isosorbide mononitrate (IMDUR) 30 MG 24 hr tablet Take 3 tablets (90 mg total) by mouth daily. Patient taking differently: Take 105 mg by mouth daily.  05/28/18  Yes Emokpae, Courage, MD  latanoprost (XALATAN) 0.005 % ophthalmic solution Place 1 drop into both eyes 2 (two) times a week. On mondays and wednesdays 11/07/15  Yes [provider]  levothyroxine (SYNTHROID, LEVOTHROID) 125 MCG tablet TAKE 1 TABLET BY MOUTH EVERY DAY BEFORE BREAKFAST Patient taking differently: Take 125 mcg by mouth daily before breakfast.  04/05/18  Yes Troy Sine, MD  lidocaine (LIDODERM) 5 % Place 1 patch onto the skin daily. Remove & Discard patch within 12 hours or as directed by MD One to each foot   Yes [provider]  lidocaine (LMX) 4 % cream Apply 1 application topically 2 (two) times daily. Apply to feet    Yes [provider]  Melatonin 3 MG TABS Take 3 mg by mouth at bedtime.   Yes [provider]  nitroGLYCERIN (NITROSTAT) 0.4 MG SL tablet Every 5 min x3 PRN chest pain 09/01/18  Yes Aline August, MD  pantoprazole (PROTONIX) 40 MG tablet Take 1 tablet (40 mg total) by mouth daily. 05/28/18  Yes Emokpae, Courage, MD  polyethylene glycol (MIRALAX / GLYCOLAX) packet Take 17 g by mouth daily. 05/27/18  Yes Emokpae, Courage, MD  psyllium (HYDROCIL/METAMUCIL) 95 % PACK Take 1 packet by mouth 3 (three) times daily.   Yes [provider]  shark liver oil-cocoa butter (PREPARATION H) 0.25-3-85.5 % suppository Place 1 suppository rectally as needed for hemorrhoids.   Yes [provider]  tamsulosin (FLOMAX) 0.4 MG CAPS capsule Take 1 capsule (0.4 mg total) by mouth daily. 05/27/18  Yes Emokpae, Courage, MD  traZODone (DESYREL) 150 MG tablet Take 150 mg by mouth at bedtime. 02/06/19  Yes [provider]  vitamin C (ASCORBIC ACID) 500 MG tablet Take 500 mg by mouth daily.   Yes [provider]  divalproex (DEPAKOTE SPRINKLES) 125 MG capsule Take 4 capsules (500 mg total) by mouth 2 (two) times daily for 30 days. Patient not taking: Reported on 02/10/2019 11/29/18 01/04/19  Elodia Florence., MD  feeding supplement, ENSURE ENLIVE, (ENSURE ENLIVE) LIQD Take 237 mLs by mouth 2 (two) times daily between meals. Patient not taking: Reported on 02/10/2019 11/29/18   Elodia Florence., MD    Physical Exam: BP (!) 141/67   Pulse 60   Temp 98.5 F (36.9 C) (Oral)   Resp 18   SpO2 100%   . General: 85 y.o. year-old male well developed well nourished in no acute distress.  Alert and confused in the setting of dementia. . Cardiovascular: Regular rate and rhythm with no rubs or gallops.  No thyromegaly or JVD noted.  No lower extremity edema. 2/4 pulses in all 4 extremities. Marland Kitchen Respiratory: Clear to auscultation with no wheezes or rales. Good inspiratory effort.  . Abdomen: Soft nontender nondistended with normal bowel sounds x4 quadrants. . Muskuloskeletal: No cyanosis, clubbing or edema noted bilaterally . Neuro: CN II-XII intact, strength, sensation, reflexes . Skin: No ulcerative lesions noted or rashes . Psychiatry: Judgement and insight appear diminished. Mood is appropriate for condition and setting  Labs on Admission:  Basic Metabolic Panel: Recent Labs  Lab 02/10/19 1200  NA 140  K 4.5  CL 112*  CO2 22  GLUCOSE 124*  BUN 40*  CREATININE 1.85*  CALCIUM 7.9*   Liver Function Tests: Recent Labs  Lab 02/10/19 1200  AST 14*  ALT 14  ALKPHOS 46  BILITOT 0.4  PROT 6.3*  ALBUMIN 2.3*   No results for input(s): LIPASE, AMYLASE in the last 168 hours. No results for input(s): AMMONIA in the last 168 hours. CBC: Recent Labs  Lab 02/10/19 1200  WBC 4.9  NEUTROABS 3.5  HGB 6.4*  HCT 20.9*  MCV 105.0*  PLT 165   Cardiac Enzymes: No results for input(s): CKTOTAL, CKMB, CKMBINDEX, TROPONINI in the last 168 hours.  BNP (last 3 results) Recent Labs    06/02/18 1628 07/29/18 1119 08/09/18 1629  BNP 627.9* 360.4* 1,084.8*    ProBNP (last 3 results) No results for input(s): PROBNP in the last 8760 hours.  CBG: No results for input(s): GLUCAP in the last 168 hours.  Radiological Exams on Admission: No results found.  EKG: I independently viewed the EKG done and my findings are as followed: None available at the time of this visit.  Assessment/Plan Present on Admission: . GI bleed  Active Problems:   GI bleed  Presumed lower GI bleed Presented with complaints of rectal bleed once last night and 1 early this morning from SNF [facility] Hemoglobin 6.4 with negative FOBT On iron supplement, hold off due to possible procedure by GI GI consulted by ED physician to further assess Hold off aspirin and Plavix for now 1 unit PRBC ordered to be transfused Repeat H&H in the morning SCDs for DVT prophylaxis   Chronic normocytic anemia suspect multifactorial secondary to chronic kidney disease versus iron deficiency versus others Baseline hemoglobin appears to be 8.5 Presented with hemoglobin of 6.4 1 unit PRBC ordered to be transfused  Hypothyroidism  Continue levothyroxine  Hyperlipidemia Continue Lipitor  GERD Continue Protonix  BPH Continue tamsulosin Monitor urine output  Chronic diastolic CHF No acute issues Last 2D echo done on November 22, 2018 revealed LVEF 55 to 60% Strict I's and O's and daily weight Resume cardiac medications  CKD 3 Baseline creatinine appears to be 1.7 with GFR of 39 Presented with creatinine of 1.85 and GFR of 36 Avoid nephrotoxic agents/hypotension Monitor urine output Repeat BMP in the morning  Paroxysmal A. Fib Rate controlled on Coreg Not on oral anticoagulation Continue to monitor on telemetry     DVT prophylaxis: SCDs  Code Status: DNR; confirmed by daughter and wife on the phone on 02/10/2019  Family Communication: Updated daughter and wife on the phone on 02/10/2019  Disposition Plan: Admit to telemetry unit  Consults called: GI consulted by ED physician  Admission status: Observation status    Kayleen Memos MD [facility] Pager [phone]  If 7PM-7AM, please contact night-coverage [email or web address] Password [email or web address]  02/10/2019, 2:01 PM

## 2019-02-10 NOTE — ED Notes (Signed)
EDP GOLDSTON SPOKE WITH DAUGHTER. VERBAL CONSENT TO TRANSFUSE. CONTINUE WITH TRANSFUSION.

## 2019-02-10 NOTE — ED Notes (Signed)
Negative for sob, back or chest pain, or rash at 15-minute post-blood start.  Pt is resting and appears comfortable.

## 2019-02-10 NOTE — ED Provider Notes (Signed)
[facility] DEPT Provider Note   CSN: [id number] Arrival date & time: 02/10/19  1104  LEVEL 5 CAVEAT - DEMENTIA/VERY HARD OF HEARING  History   Chief Complaint Chief Complaint  Patient presents with  . Rectal Bleeding  . Abdominal Pain    HPI John Peck is a 85 y.o. male.     HPI  85 year old male presents from nursing facility with rectal bleeding.  Apparently the bleeding started yesterday according to the nurse that has checked on him.  Had a bowel movement with blood today.  The patient denies any abdominal pain or chest pain.  History is difficult given how hard of hearing the patient is.  Past Medical History:  Diagnosis Date  . Anemia   . Arthritis   . CAD (coronary artery disease)    a. CABG '89; b. PCI '04; c. 11/2015 Cath/PCI: LM 20ost, LAD 170m, LCX 30p, OM2 40, RCA 30p, 10p ISR, 90d (3.0x12 Synergy DES), AM 90, VG->OM2 known to be 100, LIMA->LAD not injected, patent in 2015.  . Carotid stenosis, right   . Chronic combined systolic and diastolic CHF (congestive heart failure) ([place])   . CKD (chronic kidney disease), stage III ([employer])   . Dyspnea   . H/O: GI bleed    REMOTE HISTORY  . History of COPD   . Hyperlipidemia   . Hypertensive heart disease   . LBBB (left bundle branch block)   . Pacemaker Sept 2009   St Jude  . PAF (paroxysmal atrial fibrillation) (HCC)    a. not on anticoag due to prior GIB.  . Sick sinus syndrome [facility]) Sept 2009   ST Jude PTVDP  . Stroke [facility])     Patient Active Problem List   Diagnosis Date Noted  . Adult failure to thrive   . Palliative care by specialist   . DNR (do not resuscitate)   . Dysarthria 11/21/2018  . Acute on chronic systolic CHF (congestive heart failure) ([place]) 11/21/2018  . SOB (shortness of breath)   . Acidosis 11/17/2018  . Renal failure (ARF), acute on chronic (HCC) 11/15/2018  . Constipation 11/15/2018  . Pyuria 11/15/2018  . Dementia ([place]) 11/15/2018  . Dysphagia    . AKI (acute kidney injury) ([place])   . Altered mental status 08/09/2018  . Delirium 08/09/2018  . UTI (urinary tract infection) 08/09/2018  . Right flank pain   . Goals of care, counseling/discussion   . Palliative care encounter   . Chest pain 07/29/2018  . TIA (transient ischemic attack) 06/29/2018  . Pressure injury of skin 05/25/2018  . Pleural effusion 05/24/2018  . Acute respiratory failure with hypoxia ([place]) 05/24/2018  . NSTEMI (non-ST elevated myocardial infarction) ([place])   . Chronic systolic CHF (congestive heart failure) ([place]) 03/14/2018  . COPD clinical dx/ no pfts on record 03/14/2018  . H/O: GI bleed 03/14/2018  . CKD (chronic kidney disease), stage III ([place]) 03/14/2018  . Macrocytic anemia 03/14/2018  . Paroxysmal atrial fibrillation ([place]) 03/14/2018  . Hypothyroidism 03/14/2018  . Carotid stenosis, right 03/14/2018  . Facial droop 02/22/2018  . Pancytopenia ([place]) 02/22/2018  . Generalized weakness   . Hypertensive heart disease   . Hyperlipidemia   . Stented coronary artery   . Coronary artery disease involving native coronary artery of native heart with unstable angina pectoris ([place])   . Reactive airway disease 11/27/2015  . CAD in native artery 09/03/2015  . Weakness 05/09/2015  . Hyperlipidemia LDL goal <70 02/28/2015  .  Atherosclerosis of lower extremity with claudication ([place]) 07/17/2014  . PAF (paroxysmal atrial fibrillation) ([place]) 03/24/2013  . Lower extremity edema 03/24/2013  . Sinoatrial node dysfunction ([place]) 08/17/2012  . History of iron deficiency anemia 09/18/2011  . Essential hypertension 07/07/2009  . INTERMEDIATE CORONARY SYNDROME 07/07/2009  . S/P CABG x 2 '89. RCA stent'04. last cath Jan 2013 07/07/2009  . PACEMAKER, PERMANENT- St Jude Sept 2009 07/07/2009  . Sick sinus syndrome ([place]) 07/10/2008  . Pacemaker 07/10/2008    Past Surgical History:  Procedure Laterality Date  . CARDIAC CATHETERIZATION  11/2011   EF 50%; significant native  CAD w/80% stenosis of the LAD after 2nd giagonal vessel and septal perforating artery w/total occlusion of the mid left anterior descendiung.; 60-70% ostiasl stenosis in the circumflex vessel followed by 40% proximal stenosis and 70-80% distal circumflex stenosis;   . CARDIAC CATHETERIZATION N/A 11/29/2015   Procedure: Left Heart Cath and Cors/Grafts Angiography;  Surgeon: Burnell Blanks, MD;  Location: [place] CV LAB;  Service: Cardiovascular;  Laterality: N/A;  . CARDIAC CATHETERIZATION N/A 11/29/2015   Procedure: Coronary Stent Intervention;  Surgeon: Burnell Blanks, MD;  Location: [place] CV LAB;  Service: Cardiovascular;  Laterality: N/A;  . CATARACT EXTRACTION  2004  . CORONARY ANGIOGRAPHY N/A 02/25/2017   Procedure: Coronary Angiography;  Surgeon: Belva Crome, MD;  Location: [place] CV LAB;  Service: Cardiovascular;  Laterality: N/A;  . CORONARY ANGIOPLASTY WITH STENT PLACEMENT  2004   RCA  . CORONARY ARTERY BYPASS GRAFT  1989   had LIMA to his LAD, a vein to the circumflex. In 2004 underwent stenting to his right coronary artery.  . CORONARY STENT INTERVENTION N/A 02/24/2017   Procedure: Coronary Stent Intervention;  Surgeon: Wellington Hampshire, MD;  Location: [place] CV LAB;  Service: Cardiovascular;  Laterality: N/A;  cfx  . HEMORRHOID SURGERY    . INSERT / REPLACE / REMOVE PACEMAKER  07/17/08   DUAL-CHAMBER; PPM-ST.JUDE MEDNET  . IR THORACENTESIS ASP PLEURAL SPACE W/IMG GUIDE  05/25/2018  . IR THORACENTESIS ASP PLEURAL SPACE W/IMG GUIDE  08/01/2018  . LEFT HEART CATH AND CORS/GRAFTS ANGIOGRAPHY N/A 02/24/2017   Procedure: Left Heart Cath and Cors/Grafts Angiography;  Surgeon: Wellington Hampshire, MD;  Location: [place] CV LAB;  Service: Cardiovascular;  Laterality: N/A;  . LEFT HEART CATHETERIZATION WITH CORONARY/GRAFT ANGIOGRAM N/A 12/10/2011   Procedure: LEFT HEART CATHETERIZATION WITH Beatrix Fetters;  Surgeon: Troy Sine, MD;  Location[facility] CATH  LAB;  Service: Cardiovascular;  Laterality: N/A;  . LEFT HEART CATHETERIZATION WITH CORONARY/GRAFT ANGIOGRAM N/A 01/26/2014   Procedure: LEFT HEART CATHETERIZATION WITH Beatrix Fetters;  Surgeon: Troy Sine, MD;  Location: [facility] CATH LAB;  Service: Cardiovascular;  Laterality: N/A;  . PPM GENERATOR CHANGEOUT N/A 12/15/2017   Procedure: PPM GENERATOR CHANGEOUT;  Surgeon: Evans Lance, MD;  Location: [place] CV LAB;  Service: Cardiovascular;  Laterality: N/A;  . PROSTATECTOMY          Home Medications    Prior to Admission medications   Medication Sig Start Date End Date Taking? Authorizing Provider  acetaminophen (TYLENOL) 325 MG tablet Take 650 mg by mouth 3 (three) times daily.    [provider]  albuterol (PROVENTIL HFA;VENTOLIN HFA) 108 (90 Base) MCG/ACT inhaler Inhale 2 puffs into the lungs every 4 (four) hours as needed for wheezing or shortness of breath (or coughing). 05/27/18   Roxan Hockey, MD  aspirin 81 MG tablet Take 81 mg by mouth  daily.     [provider]  atorvastatin (LIPITOR) 20 MG tablet  01/01/19   [provider]  carvedilol (COREG) 12.5 MG tablet Take 1 tablet (12.5 mg total) by mouth 2 (two) times daily with a meal. 05/27/18   Emokpae, Courage, MD  clopidogrel (PLAVIX) 75 MG tablet Take 1 tablet (75 mg total) by mouth daily. 05/27/18   Roxan Hockey, MD  desonide (DESOWEN) 0.05 % cream Apply 1 application topically 2 (two) times daily.    [provider]  divalproex (DEPAKOTE SPRINKLES) 125 MG capsule Take 4 capsules (500 mg total) by mouth 2 (two) times daily for 30 days. 11/29/18 01/04/19  Elodia Florence., MD  feeding supplement, ENSURE ENLIVE, (ENSURE ENLIVE) LIQD Take 237 mLs by mouth 2 (two) times daily between meals. 11/29/18   Elodia Florence., MD  ferrous sulfate 325 (65 FE) MG tablet Take 1 tablet (325 mg total) by mouth 2 (two) times daily with a meal. 05/27/18   Emokpae, Courage, MD   ipratropium-albuterol (DUONEB) 0.5-2.5 (3) MG/3ML SOLN Take 3 mLs by nebulization 2 (two) times daily. 02/24/18   Mikhail, Velta Addison, DO  isosorbide mononitrate (IMDUR) 30 MG 24 hr tablet Take 3 tablets (90 mg total) by mouth daily. 05/28/18   Emokpae, Courage, MD  latanoprost (XALATAN) 0.005 % ophthalmic solution Place 1 drop into both eyes 2 (two) times a week. On mondays and wednesdays 11/07/15   [provider]  levothyroxine (SYNTHROID, LEVOTHROID) 125 MCG tablet TAKE 1 TABLET BY MOUTH EVERY DAY BEFORE BREAKFAST Patient taking differently: Take 125 mcg by mouth daily before breakfast.  04/05/18   Troy Sine, MD  nitroGLYCERIN (NITROSTAT) 0.4 MG SL tablet Every 5 min x3 PRN chest pain Patient not taking: Reported on 01/04/2019 09/01/18   Aline August, MD  pantoprazole (PROTONIX) 40 MG tablet Take 1 tablet (40 mg total) by mouth daily. 05/28/18   Roxan Hockey, MD  polyethylene glycol (MIRALAX / GLYCOLAX) packet Take 17 g by mouth daily. 05/27/18   Roxan Hockey, MD  senna (SENOKOT) 8.6 MG TABS tablet Take 1 tablet by mouth.    [provider]  shark liver oil-cocoa butter (PREPARATION H) 0.25-3-85.5 % suppository Place 1 suppository rectally as needed for hemorrhoids.    [provider]  tamsulosin (FLOMAX) 0.4 MG CAPS capsule Take 1 capsule (0.4 mg total) by mouth daily. 05/27/18   Roxan Hockey, MD  traZODone (DESYREL) 50 MG tablet  12/12/18   [provider]  UNABLE TO FIND Lidocane cream 4%    [provider]    Family History Family History  Problem Relation Age of Onset  . Diabetes Father   . Stroke Sister   . Heart disease Sister   . Heart disease Brother   . Heart attack Brother   . Stroke Brother   . Hypertension Neg Hx     Social History Social History   Tobacco Use  . Smoking status: Former Smoker    Packs/day: 0.75    Years: 65.00    Pack years: 48.75    Types: Cigarettes    Last attempt to quit: 11/09/1997     Years since quitting: 21.2  . Smokeless tobacco: Former Systems developer    Quit date: 10/29/2008  Substance Use Topics  . Alcohol use: No  . Drug use: No     Allergies   Patient has no known allergies.   Review of Systems Review of Systems  Unable to perform ROS: Dementia  Physical Exam Updated Vital Signs BP 133/61 (BP Location: Left Arm)   Pulse 62   Temp 98.5 F (36.9 C) (Oral)   Resp 18   SpO2 98%   Physical Exam Vitals signs and nursing note reviewed.  Constitutional:      Appearance: He is well-developed.  HENT:     Head: Normocephalic and atraumatic.     Right Ear: External ear normal.     Left Ear: External ear normal.     Nose: Nose normal.  Eyes:     General:        Right eye: No discharge.        Left eye: No discharge.  Neck:     Musculoskeletal: Neck supple.  Cardiovascular:     Rate and Rhythm: Regular rhythm.     Pulses:          Radial pulses are 2+ on the left side.  Pulmonary:     Effort: Pulmonary effort is normal.     Breath sounds: Normal breath sounds.  Abdominal:     Palpations: Abdomen is soft.     Tenderness: There is no abdominal tenderness.  Genitourinary:    Comments: Rectal exam shows dark red blood. No obvious hemorrhoids or masses Skin:    General: Skin is warm and dry.  Neurological:     Mental Status: He is alert.  Psychiatric:        Mood and Affect: Mood is not anxious.      ED Treatments / Results  Labs (all labs ordered are listed, but only abnormal results are displayed) Labs Reviewed  COMPREHENSIVE METABOLIC PANEL - Abnormal; Notable for the following components:      Result Value   Chloride 112 (*)    Glucose, Bld 124 (*)    BUN 40 (*)    Creatinine, Ser 1.85 (*)    Calcium 7.9 (*)    Total Protein 6.3 (*)    Albumin 2.3 (*)    AST 14 (*)    GFR calc non Af Amer 31 (*)    GFR calc Af Amer 36 (*)    All other components within normal limits  CBC WITH DIFFERENTIAL/PLATELET - Abnormal; Notable for the  following components:   RBC 1.99 (*)    Hemoglobin 6.4 (*)    HCT 20.9 (*)    MCV 105.0 (*)    RDW 17.6 (*)    Lymphs Abs 0.6 (*)    All other components within normal limits  PROTIME-INR - Abnormal; Notable for the following components:   Prothrombin Time 17.5 (*)    INR 1.5 (*)    All other components within normal limits  POC OCCULT BLOOD, ED  TYPE AND SCREEN  PREPARE RBC (CROSSMATCH)    EKG None  Radiology No results found.  Procedures .Critical Care Performed by: Sherwood Gambler, MD Authorized by: Sherwood Gambler, MD   Critical care provider statement:    Critical care time (minutes):  30   Critical care time was exclusive of:  Separately billable procedures and treating other patients   Critical care was necessary to treat or prevent imminent or life-threatening deterioration of the following conditions:  Circulatory failure   Critical care was time spent personally by me on the following activities:  Development of treatment plan with patient or surrogate, evaluation of patient's response to treatment, discussions with consultants, examination of patient, obtaining history from patient or surrogate, ordering and performing treatments and interventions, ordering and review of laboratory  studies, ordering and review of radiographic studies, pulse oximetry, re-evaluation of patient's condition and review of old charts   (including critical care time)  Medications Ordered in ED Medications  0.9 %  sodium chloride infusion (has no administration in time range)     Initial Impression / Assessment and Plan / ED Course  I have reviewed the triage vital signs and the nursing notes.  Pertinent labs & imaging results that were available during my care of the patient were reviewed by me and considered in my medical decision making (see chart for details).  Clinical Course as of Feb 10 1252  Fri Feb 10, 2019  1139 I spoke to [place], Jessejames Steelman, who indicates that patient is  DNR, but would want investigation to find out where this bleeding is coming from. Includes IV, labs, and to ask patient if he would want a blood transfusion. However, the MOST form was filled out while he was very ill, and is doing much better   [SG]    Clinical Course User Index [SG] Sherwood Gambler, MD       Patient does not have any abdominal pain on exam or by history.  He is having rectal bleeding and with his combination of being on Plavix, current/active bleeding, and anemia to 6.4, he will need admission to the hospital.  I did discuss this again with his POA and she consents to blood transfusion.  Vitals are stable.  I do not think emergent CT imaging of his abdomen would be beneficial given no abdominal tenderness or vomiting. I discussed with hospitalist, Dr. Nevada Crane, who will admit. Discussed with Dr. Alessandra Bevels who will see in consultation tomorrow.   Final Clinical Impressions(s) / ED Diagnoses   Final diagnoses:  Acute blood loss anemia  Rectal bleeding    ED Discharge Orders    None       Sherwood Gambler, MD 02/10/19 1322

## 2019-02-10 NOTE — ED Notes (Signed)
ED Provider at bedside. [place] 

## 2019-02-10 NOTE — ED Notes (Signed)
ED TO INPATIENT HANDOFF REPORT  ED Nurse Name and Phone #: Ly Bacchi RN   S Name/Age/Gender John Peck 85 y.o. male Room/Bed: WA23/WA23  Code Status   Code Status: DNR  Home/SNF/Other Nursing Home Patient oriented to: self and situation Is this baseline? Yes   Triage Complete: Triage complete  Chief Complaint GI Bleed  Triage Note Per [facility]- Pt has MOST/DNR form. Staff endorse dark bloody stool with clots that started last night. Noticed after changing brief last night. Staff this morning endorse the same this am. EMS assessed before transport and pt was not actively bleeding before transport. Family aware of pt's current status and transport to ED.  Reported No fever N/V/D. EDP [place] evaluated upon arrival.    Allergies No Known Allergies  Level of Care/Admitting Diagnosis ED Disposition    ED Disposition Condition Comment   Admit  Hospital Area: [place] [[id number]]  Level of Care: Telemetry [5]  Admit to tele based on following criteria: Monitor for Ischemic changes  Diagnosis: GI bleed [323557]  Admitting Physician: Kayleen Memos [[id number]]  Attending Physician: Kayleen Memos [[id number]]  PT Class (Do Not Modify): Observation [104]  PT Acc Code (Do Not Modify): Observation [10022]       B Medical/Surgery History Past Medical History:  Diagnosis Date  . Anemia   . Arthritis   . CAD (coronary artery disease)    a. CABG '89; b. PCI '04; c. 11/2015 Cath/PCI: LM 20ost, LAD 142m, LCX 30p, OM2 40, RCA 30p, 10p ISR, 90d (3.0x12 Synergy DES), AM 90, VG->OM2 known to be 100, LIMA->LAD not injected, patent in 2015.  . Carotid stenosis, right   . Chronic combined systolic and diastolic CHF (congestive heart failure) ([place])   . CKD (chronic kidney disease), stage III ([place])   . Dyspnea   . H/O: GI bleed    REMOTE HISTORY  . History of COPD   . Hyperlipidemia   . Hypertensive heart disease   . LBBB (left bundle branch block)   . Pacemaker Sept  2009   St Jude  . PAF (paroxysmal atrial fibrillation) (HCC)    a. not on anticoag due to prior GIB.  . Sick sinus syndrome [facility]) Sept 2009   ST Jude PTVDP  . Stroke [facility])    Past Surgical History:  Procedure Laterality Date  . CARDIAC CATHETERIZATION  11/2011   EF 50%; significant native CAD w/80% stenosis of the LAD after 2nd giagonal vessel and septal perforating artery w/total occlusion of the mid left anterior descendiung.; 60-70% ostiasl stenosis in the circumflex vessel followed by 40% proximal stenosis and 70-80% distal circumflex stenosis;   . CARDIAC CATHETERIZATION N/A 11/29/2015   Procedure: Left Heart Cath and Cors/Grafts Angiography;  Surgeon: Burnell Blanks, MD;  Location: [place] CV LAB;  Service: Cardiovascular;  Laterality: N/A;  . CARDIAC CATHETERIZATION N/A 11/29/2015   Procedure: Coronary Stent Intervention;  Surgeon: Burnell Blanks, MD;  Location: [place] CV LAB;  Service: Cardiovascular;  Laterality: N/A;  . CATARACT EXTRACTION  2004  . CORONARY ANGIOGRAPHY N/A 02/25/2017   Procedure: Coronary Angiography;  Surgeon: Belva Crome, MD;  Location: [place] CV LAB;  Service: Cardiovascular;  Laterality: N/A;  . CORONARY ANGIOPLASTY WITH STENT PLACEMENT  2004   RCA  . CORONARY ARTERY BYPASS GRAFT  1989   had LIMA to his LAD, a vein to the circumflex. In 2004 underwent stenting to his right coronary artery.  . CORONARY STENT INTERVENTION N/A  02/24/2017   Procedure: Coronary Stent Intervention;  Surgeon: Wellington Hampshire, MD;  Location: [place] CV LAB;  Service: Cardiovascular;  Laterality: N/A;  cfx  . HEMORRHOID SURGERY    . INSERT / REPLACE / REMOVE PACEMAKER  07/17/08   DUAL-CHAMBER; PPM-ST.JUDE MEDNET  . IR THORACENTESIS ASP PLEURAL SPACE W/IMG GUIDE  05/25/2018  . IR THORACENTESIS ASP PLEURAL SPACE W/IMG GUIDE  08/01/2018  . LEFT HEART CATH AND CORS/GRAFTS ANGIOGRAPHY N/A 02/24/2017   Procedure: Left Heart Cath and Cors/Grafts Angiography;   Surgeon: Wellington Hampshire, MD;  Location: [place] CV LAB;  Service: Cardiovascular;  Laterality: N/A;  . LEFT HEART CATHETERIZATION WITH CORONARY/GRAFT ANGIOGRAM N/A 12/10/2011   Procedure: LEFT HEART CATHETERIZATION WITH Beatrix Fetters;  Surgeon: Troy Sine, MD;  Location: [facility] CATH LAB;  Service: Cardiovascular;  Laterality: N/A;  . LEFT HEART CATHETERIZATION WITH CORONARY/GRAFT ANGIOGRAM N/A 01/26/2014   Procedure: LEFT HEART CATHETERIZATION WITH Beatrix Fetters;  Surgeon: Troy Sine, MD;  Location: [facility] CATH LAB;  Service: Cardiovascular;  Laterality: N/A;  . PPM GENERATOR CHANGEOUT N/A 12/15/2017   Procedure: PPM GENERATOR CHANGEOUT;  Surgeon: Evans Lance, MD;  Location: [place] CV LAB;  Service: Cardiovascular;  Laterality: N/A;  . PROSTATECTOMY       A IV Location/Drains/Wounds Patient Lines/Drains/Airways Status   Active Line/Drains/Airways    Name:   Placement date:   Placement time:   Site:   Days:   Peripheral IV 02/10/19 Left Forearm   02/10/19    1210    Forearm   less than 1   Peripheral IV 02/10/19 Left;Posterior;Medial Hand   02/10/19    1443    Hand   less than 1   External Urinary Catheter   11/16/18    -    -   86          Intake/Output Last 24 hours  Intake/Output Summary (Last 24 hours) at 02/10/2019 1604 Last data filed at 02/10/2019 1426 Gross per 24 hour  Intake 815 ml  Output -  Net 815 ml    Labs/Imaging Results for orders placed or performed during the hospital encounter of 02/10/19 (from the past 48 hour(s))  POC occult blood, ED Provider will collect     Status: None   Collection Time: 02/10/19 11:52 AM  Result Value Ref Range   Fecal Occult Bld NEGATIVE NEGATIVE  Comprehensive metabolic panel     Status: Abnormal   Collection Time: 02/10/19 12:00 PM  Result Value Ref Range   Sodium 140 135 - 145 mmol/L   Potassium 4.5 3.5 - 5.1 mmol/L   Chloride 112 (H) 98 - 111 mmol/L   CO2 22 22 - 32 mmol/L   Glucose, Bld 124 (H)  70 - 99 mg/dL   BUN 40 (H) 8 - 23 mg/dL   Creatinine, Ser 1.85 (H) 0.61 - 1.24 mg/dL   Calcium 7.9 (L) 8.9 - 10.3 mg/dL   Total Protein 6.3 (L) 6.5 - 8.1 g/dL   Albumin 2.3 (L) 3.5 - 5.0 g/dL   AST 14 (L) 15 - 41 U/L   ALT 14 0 - 44 U/L   Alkaline Phosphatase 46 38 - 126 U/L   Total Bilirubin 0.4 0.3 - 1.2 mg/dL   GFR calc non Af Amer 31 (L) >60 mL/min   GFR calc Af Amer 36 (L) >60 mL/min   Anion gap 6 5 - 15    Comment: Performed at [facility], [place]  [address] [place], [place] [address]  CBC with Differential     Status: Abnormal   Collection Time: 02/10/19 12:00 PM  Result Value Ref Range   WBC 4.9 4.0 - 10.5 K/uL   RBC 1.99 (L) 4.22 - 5.81 MIL/uL   Hemoglobin 6.4 (LL) 13.0 - 17.0 g/dL    Comment: This critical result has verified and been called to A.LASSITITER by Marella Bile on 04 03 2020 at 1229, and has been read back.    HCT 20.9 (L) 39.0 - 52.0 %   MCV 105.0 (H) 80.0 - 100.0 fL   MCH 32.2 26.0 - 34.0 pg   MCHC 30.6 30.0 - 36.0 g/dL   RDW 17.6 (H) 11.5 - 15.5 %   Platelets 165 150 - 400 K/uL   nRBC 0.0 0.0 - 0.2 %   Neutrophils Relative % 72 %   Neutro Abs 3.5 1.7 - 7.7 K/uL   Lymphocytes Relative 12 %   Lymphs Abs 0.6 (L) 0.7 - 4.0 K/uL   Monocytes Relative 13 %   Monocytes Absolute 0.6 0.1 - 1.0 K/uL   Eosinophils Relative 3 %   Eosinophils Absolute 0.2 0.0 - 0.5 K/uL   Basophils Relative 0 %   Basophils Absolute 0.0 0.0 - 0.1 K/uL   Immature Granulocytes 0 %   Abs Immature Granulocytes 0.02 0.00 - 0.07 K/uL    Comment: Performed at [facility], [place] [address], [place], [place] [address]  Protime-INR     Status: Abnormal   Collection Time: 02/10/19 12:00 PM  Result Value Ref Range   Prothrombin Time 17.5 (H) 11.4 - 15.2 seconds   INR 1.5 (H) 0.8 - 1.2    Comment: (NOTE) INR goal varies based on device and disease states. Performed at [facility], [place] [address], [place], [place] [address]    Type and screen [place]     Status: None (Preliminary result)   Collection Time: 02/10/19 12:01 PM  Result Value Ref Range   ABO/RH(D) O POS    Antibody Screen NEG    Sample Expiration 02/13/2019    Unit Number [id number]    Blood Component Type RED CELLS,LR    Unit division 00    Status of Unit ISSUED    Transfusion Status OK TO TRANSFUSE    Crossmatch Result      Compatible Performed at [facility], [place] [address] [place], [place] [address]   Prepare RBC     Status: None   Collection Time: 02/10/19 12:45 PM  Result Value Ref Range   Order Confirmation      ORDER PROCESSED BY BLOOD BANK Performed at [place] [address], [place], [place] [address]    No results found.  Pending Labs Unresulted Labs (From admission, onward)    Start     Ordered   02/11/19 0500  CBC  Tomorrow morning,   R     02/10/19 1349   02/11/19 0500  Comprehensive metabolic panel  Tomorrow morning,   R     02/10/19 1349   02/11/19 0500  Iron and TIBC  Tomorrow morning,   R     02/10/19 1432   02/11/19 0500  Ferritin  Tomorrow morning,   R     02/10/19 1432          Vitals/Pain Today's Vitals   02/10/19 1430 02/10/19 1448 02/10/19 1500 02/10/19 1530  BP: (!) 143/69 (!) 145/73 (!) 151/72 Marland Kitchen)  172/76  Pulse: 60 60 (!) 104 64  Resp: 17 17 17 16   Temp:  (!) 97.5 F (36.4 C)    TempSrc:  Oral    SpO2: 100% 99% 100% 95%    Isolation Precautions No active isolations  Medications Medications  atorvastatin (LIPITOR) tablet 20 mg (has no administration in time range)  levothyroxine (SYNTHROID, LEVOTHROID) tablet 125 mcg (has no administration in time range)  pantoprazole (PROTONIX) EC tablet 40 mg (has no administration in time range)  tamsulosin (FLOMAX) capsule 0.4 mg (has no administration in time range)  traZODone (DESYREL) tablet 150 mg (has no administration in time range)  vitamin C (ASCORBIC ACID) tablet 500 mg  (has no administration in time range)  carvedilol (COREG) tablet 3.125 mg (has no administration in time range)  ondansetron (ZOFRAN) injection 4 mg (has no administration in time range)  acetaminophen (TYLENOL) tablet 650 mg (has no administration in time range)  0.9 %  sodium chloride infusion (10 mL/hr Intravenous New Bag/Given (Non-Interop) 02/10/19 1431)    Mobility walks with device Moderate fall risk   Focused Assessments   R Recommendations: See Admitting Provider Note  Report given to:   Additional Notes:

## 2019-02-10 NOTE — ED Notes (Signed)
Bed: VE55 Expected date:  Expected time:  Means of arrival:  Comments: EMS-GI bleed

## 2019-02-10 NOTE — ED Triage Notes (Signed)
Per [facility]- Pt has MOST/DNR form. Staff endorse dark bloody stool with clots that started last night. Noticed after changing brief last night. Staff this morning endorse the same this am. EMS assessed before transport and pt was not actively bleeding before transport. Family aware of pt's current status and transport to ED.  Reported No fever N/V/D. EDP [place] evaluated upon arrival.

## 2019-02-11 DIAGNOSIS — J449 Chronic obstructive pulmonary disease, unspecified: Secondary | ICD-10-CM

## 2019-02-11 DIAGNOSIS — N183 Chronic kidney disease, stage 3 (moderate): Secondary | ICD-10-CM

## 2019-02-11 DIAGNOSIS — I480 Paroxysmal atrial fibrillation: Secondary | ICD-10-CM

## 2019-02-11 DIAGNOSIS — I495 Sick sinus syndrome: Secondary | ICD-10-CM

## 2019-02-11 DIAGNOSIS — F0390 Unspecified dementia without behavioral disturbance: Secondary | ICD-10-CM

## 2019-02-11 DIAGNOSIS — I10 Essential (primary) hypertension: Secondary | ICD-10-CM

## 2019-02-11 DIAGNOSIS — I5022 Chronic systolic (congestive) heart failure: Secondary | ICD-10-CM

## 2019-02-11 DIAGNOSIS — E785 Hyperlipidemia, unspecified: Secondary | ICD-10-CM

## 2019-02-11 DIAGNOSIS — E039 Hypothyroidism, unspecified: Secondary | ICD-10-CM

## 2019-02-11 LAB — CBC
HCT: 23.5 % — ABNORMAL LOW (ref 39.0–52.0)
HCT: 23.8 % — ABNORMAL LOW (ref 39.0–52.0)
Hemoglobin: 7.3 g/dL — ABNORMAL LOW (ref 13.0–17.0)
Hemoglobin: 7.5 g/dL — ABNORMAL LOW (ref 13.0–17.0)
MCH: 30.9 pg (ref 26.0–34.0)
MCH: 31.1 pg (ref 26.0–34.0)
MCHC: 31.1 g/dL (ref 30.0–36.0)
MCHC: 31.5 g/dL (ref 30.0–36.0)
MCV: 99.6 fL (ref 80.0–100.0)
MCV: 98.8 fL (ref 80.0–100.0)
Platelets: 155 10*3/uL (ref 150–400)
Platelets: 153 10*3/uL (ref 150–400)
RBC: 2.36 MIL/uL — ABNORMAL LOW (ref 4.22–5.81)
RBC: 2.41 MIL/uL — ABNORMAL LOW (ref 4.22–5.81)
RDW: 19.9 % — ABNORMAL HIGH (ref 11.5–15.5)
RDW: 19.3 % — ABNORMAL HIGH (ref 11.5–15.5)
WBC: 5.2 10*3/uL (ref 4.0–10.5)
WBC: 5.8 10*3/uL (ref 4.0–10.5)
nRBC: 0 % (ref 0.0–0.2)
nRBC: 0 % (ref 0.0–0.2)

## 2019-02-11 LAB — COMPREHENSIVE METABOLIC PANEL
ALT: 13 U/L (ref 0–44)
AST: 12 U/L — ABNORMAL LOW (ref 15–41)
Albumin: 2.3 g/dL — ABNORMAL LOW (ref 3.5–5.0)
Alkaline Phosphatase: 41 U/L (ref 38–126)
Anion gap: 8 (ref 5–15)
BUN: 37 mg/dL — ABNORMAL HIGH (ref 8–23)
CO2: 20 mmol/L — ABNORMAL LOW (ref 22–32)
Calcium: 8 mg/dL — ABNORMAL LOW (ref 8.9–10.3)
Chloride: 111 mmol/L (ref 98–111)
Creatinine, Ser: 1.59 mg/dL — ABNORMAL HIGH (ref 0.61–1.24)
GFR calc Af Amer: 43 mL/min — ABNORMAL LOW (ref >60)
GFR calc non Af Amer: 37 mL/min — ABNORMAL LOW (ref >60)
Glucose, Bld: 75 mg/dL (ref 70–99)
Potassium: 4.5 mmol/L (ref 3.5–5.1)
Sodium: 139 mmol/L (ref 135–145)
Total Bilirubin: 0.8 mg/dL (ref 0.3–1.2)
Total Protein: 6.2 g/dL — ABNORMAL LOW (ref 6.5–8.1)

## 2019-02-11 LAB — HEMOGLOBIN AND HEMATOCRIT, BLOOD
HCT: 24 % — ABNORMAL LOW (ref 39.0–52.0)
Hemoglobin: 7.6 g/dL — ABNORMAL LOW (ref 13.0–17.0)

## 2019-02-11 LAB — IRON AND TIBC
Iron: 37 ug/dL — ABNORMAL LOW (ref 45–182)
Saturation Ratios: 25 % (ref 17.9–39.5)
TIBC: 149 ug/dL — ABNORMAL LOW (ref 250–450)
UIBC: 112 ug/dL

## 2019-02-11 LAB — FERRITIN: Ferritin: 263 ng/mL (ref 24–336)

## 2019-02-11 MED ORDER — IPRATROPIUM-ALBUTEROL 0.5-2.5 (3) MG/3ML IN SOLN
3.0000 mL | Freq: Two times a day (BID) | RESPIRATORY_TRACT | Status: DC
  Administered 2019-02-11 – 2019-02-14 (×6): 3 mL via RESPIRATORY_TRACT
  Filled 2019-02-11 (×6): qty 3

## 2019-02-11 MED ORDER — ALBUTEROL SULFATE (2.5 MG/3ML) 0.083% IN NEBU
3.0000 mL | INHALATION_SOLUTION | Freq: Four times a day (QID) | RESPIRATORY_TRACT | Status: DC
  Administered 2019-02-11: 3 mL via RESPIRATORY_TRACT
  Filled 2019-02-11: qty 3

## 2019-02-11 NOTE — Consult Note (Signed)
Subjective:   HPI  The patient is a 85 year old male who was admitted to the hospital yesterday with rectal bleeding.  He has a history of significant dementia coronary artery disease paroxysmal atrial fibrillation but is not on anticoagulation.  He has a history of sick sinus syndrome pacemaker diastolic congestive heart failure and chronic kidney disease.  He resides in a skilled nursing facility.  He was sent here because of rectal bleeding yesterday.  Hemoglobin on admission was 6.4.  Baseline is around 8.5.  Apparently had a colonoscopy years ago but I do not see that report.     Past Medical History:  Diagnosis Date  . Anemia   . Arthritis   . CAD (coronary artery disease)    a. CABG '89; b. PCI '04; c. 11/2015 Cath/PCI: LM 20ost, LAD 177m, LCX 30p, OM2 40, RCA 30p, 10p ISR, 90d (3.0x12 Synergy DES), AM 90, VG->OM2 known to be 100, LIMA->LAD not injected, patent in 2015.  . Carotid stenosis, right   . Chronic combined systolic and diastolic CHF (congestive heart failure) ([place])   . CKD (chronic kidney disease), stage III ([place])   . Dyspnea   . H/O: GI bleed    REMOTE HISTORY  . History of COPD   . Hyperlipidemia   . Hypertensive heart disease   . LBBB (left bundle branch block)   . Pacemaker Sept 2009   St Jude  . PAF (paroxysmal atrial fibrillation) ([facility])    a. not on anticoag due to prior GIB.  . Sick sinus syndrome [facility]) Sept 2009   ST Jude PTVDP  . Stroke [facility])    Past Surgical History:  Procedure Laterality Date  . CARDIAC CATHETERIZATION  11/2011   EF 50%; significant native CAD w/80% stenosis of the LAD after 2nd giagonal vessel and septal perforating artery w/total occlusion of the mid left anterior descendiung.; 60-70% ostiasl stenosis in the circumflex vessel followed by 40% proximal stenosis and 70-80% distal circumflex stenosis;   . CARDIAC CATHETERIZATION N/A 11/29/2015   Procedure: Left Heart Cath and Cors/Grafts Angiography;  Surgeon: Burnell Blanks, MD;   Location: [place] CV LAB;  Service: Cardiovascular;  Laterality: N/A;  . CARDIAC CATHETERIZATION N/A 11/29/2015   Procedure: Coronary Stent Intervention;  Surgeon: Burnell Blanks, MD;  Location: [place] CV LAB;  Service: Cardiovascular;  Laterality: N/A;  . CATARACT EXTRACTION  2004  . CORONARY ANGIOGRAPHY N/A 02/25/2017   Procedure: Coronary Angiography;  Surgeon: Belva Crome, MD;  Location: [place] CV LAB;  Service: Cardiovascular;  Laterality: N/A;  . CORONARY ANGIOPLASTY WITH STENT PLACEMENT  2004   RCA  . CORONARY ARTERY BYPASS GRAFT  1989   had LIMA to his LAD, a vein to the circumflex. In 2004 underwent stenting to his right coronary artery.  . CORONARY STENT INTERVENTION N/A 02/24/2017   Procedure: Coronary Stent Intervention;  Surgeon: Wellington Hampshire, MD;  Location: [place] CV LAB;  Service: Cardiovascular;  Laterality: N/A;  cfx  . HEMORRHOID SURGERY    . INSERT / REPLACE / REMOVE PACEMAKER  07/17/08   DUAL-CHAMBER; PPM-ST.JUDE MEDNET  . IR THORACENTESIS ASP PLEURAL SPACE W/IMG GUIDE  05/25/2018  . IR THORACENTESIS ASP PLEURAL SPACE W/IMG GUIDE  08/01/2018  . LEFT HEART CATH AND CORS/GRAFTS ANGIOGRAPHY N/A 02/24/2017   Procedure: Left Heart Cath and Cors/Grafts Angiography;  Surgeon: Wellington Hampshire, MD;  Location: [place] CV LAB;  Service: Cardiovascular;  Laterality: N/A;  . LEFT HEART CATHETERIZATION WITH CORONARY/GRAFT ANGIOGRAM  N/A 12/10/2011   Procedure: LEFT HEART CATHETERIZATION WITH Beatrix Fetters;  Surgeon: Troy Sine, MD;  Location: [facility] CATH LAB;  Service: Cardiovascular;  Laterality: N/A;  . LEFT HEART CATHETERIZATION WITH CORONARY/GRAFT ANGIOGRAM N/A 01/26/2014   Procedure: LEFT HEART CATHETERIZATION WITH Beatrix Fetters;  Surgeon: Troy Sine, MD;  Location: [facility] CATH LAB;  Service: Cardiovascular;  Laterality: N/A;  . PPM GENERATOR CHANGEOUT N/A 12/15/2017   Procedure: PPM GENERATOR CHANGEOUT;  Surgeon: Evans Lance, MD;   Location: [place] CV LAB;  Service: Cardiovascular;  Laterality: N/A;  . PROSTATECTOMY     Social History   Socioeconomic History  . Marital status: Married    Spouse name: Not on file  . Number of children: 5  . Years of education: Not on file  . Highest education level: Not on file  Occupational History  . Occupation: RETIRED    Employer: RETIRED    Comment: [employer]  . Financial resource strain: Not hard at all  . Food insecurity:    Worry: Never true    Inability: Never true  . Transportation needs:    Medical: No    Non-medical: No  Tobacco Use  . Smoking status: Former Smoker    Packs/day: 0.75    Years: 65.00    Pack years: 48.75    Types: Cigarettes    Last attempt to quit: 11/09/1997    Years since quitting: 21.2  . Smokeless tobacco: Former Systems developer    Quit date: 10/29/2008  Substance and Sexual Activity  . Alcohol use: No  . Drug use: No  . Sexual activity: Not Currently  Lifestyle  . Physical activity:    Days per week: 0 days    Minutes per session: 0 min  . Stress: Not on file  Relationships  . Social connections:    Talks on phone: Never    Gets together: More than three times a week    Attends religious service: Never    Active member of club or organization: Yes    Attends meetings of clubs or organizations: Never    Relationship status: Married  . Intimate partner violence:    Fear of current or ex partner: Not on file    Emotionally abused: Not on file    Physically abused: Not on file    Forced sexual activity: Not on file  Other Topics Concern  . Not on file  Social History Narrative  . Not on file   family history includes Diabetes in his father; Heart attack in his brother; Heart disease in his brother and sister; Stroke in his brother and sister.  Current Facility-Administered Medications:  .  acetaminophen (TYLENOL) tablet 650 mg, 650 mg, Oral, Q6H PRN, Hall, Carole N, DO .  albuterol (PROVENTIL HFA;VENTOLIN HFA)  108 (90 Base) MCG/ACT inhaler 2 puff, 2 puff, Inhalation, Q6H, Nettey, Evalee Jefferson, MD .  atorvastatin (LIPITOR) tablet 20 mg, 20 mg, Oral, q1800, Hall, Carole N, DO .  carvedilol (COREG) tablet 3.125 mg, 3.125 mg, Oral, BID WC, Hall, Carole N, DO .  levothyroxine (SYNTHROID, LEVOTHROID) tablet 125 mcg, 125 mcg, Oral, Q0600, Hall, Carole N, DO .  ondansetron (ZOFRAN) injection 4 mg, 4 mg, Intravenous, Q6H PRN, Hall, Carole N, DO .  pantoprazole (PROTONIX) EC tablet 40 mg, 40 mg, Oral, Daily, Hall, Carole N, DO .  tamsulosin (FLOMAX) capsule 0.4 mg, 0.4 mg, Oral, Daily, Hall, Carole N, DO .  traZODone (DESYREL) tablet 150 mg, 150 mg,  Oral, QHS, Hall, Carole N, DO .  vitamin C (ASCORBIC ACID) tablet 500 mg, 500 mg, Oral, Daily, Hall, Carole N, DO No Known Allergies   Objective:     BP (!) 155/72 (BP Location: Right Arm)   Pulse 63   Temp 98 F (36.7 C) (Oral)   Resp 18   Ht 5\' 9"  (1.753 m)   Wt 63.3 kg   SpO2 97%   BMI 20.61 kg/m   No distress  Hard of hearing  Heart regular rhythm  Lungs clear  Abdomen soft and nontender    Laboratory No components found for: D1    Assessment:     Lower GI bleed.  This could be diverticular in nature.      Plan:     Supportive care.  Follow clinically.  Transfuse blood as needed.  If further significant bleeding occurs I would obtain a nuclear medicine GI bleeding scan and if this is positive for lower GI bleed proceed with angiogram with embolization by interventional radiology. Lab Results  Component Value Date   HGB 7.3 (L) 02/11/2019   HGB 6.4 (LL) 02/10/2019   HGB 8.0 (L) 11/29/2018   HGB 10.3 (L) 12/10/2017   HGB 10.8 (L) 07/28/2017   HGB 11.0 (L) 12/25/2016   HGB 10.2 (L) 07/17/2016   HGB 11.9 (L) 03/17/2016   HCT 23.5 (L) 02/11/2019   HCT 20.9 (L) 02/10/2019   HCT 26.3 (L) 11/29/2018   HCT 31.8 (L) 12/10/2017   HCT 34.1 (L) 07/28/2017   HCT 32.2 (L) 12/25/2016   HCT 30.3 (L) 07/17/2016   HCT 36.3 (L) 03/17/2016    ALKPHOS 41 02/11/2019   ALKPHOS 46 02/10/2019   ALKPHOS 43 11/29/2018   ALKPHOS 92 01/19/2014   ALKPHOS 102 07/21/2013   ALKPHOS 113 01/20/2013   AST 12 (L) 02/11/2019   AST 14 (L) 02/10/2019   AST 18 11/29/2018   AST 11 01/19/2014   AST 11 07/21/2013   AST 9 01/20/2013   ALT 13 02/11/2019   ALT 14 02/10/2019   ALT 14 11/29/2018   ALT 8 01/19/2014   ALT 7 07/21/2013   ALT 7 01/20/2013

## 2019-02-11 NOTE — Progress Notes (Addendum)
Pt  Had a large bloody stool - lg burgundy clots  Update sent via Amion to oncall hospitalist [place].

## 2019-02-11 NOTE — Progress Notes (Signed)
Pt had a large bloody stool- large burgundy clots

## 2019-02-11 NOTE — Progress Notes (Signed)
PT Cancellation Note  Patient Details Name: John Peck MRN: [id number] DOB: 1924-04-27   Cancelled Treatment:    Reason Eval/Treat Not Completed: Medical issues which prohibited therapy; Pt Hgb trending up however defer per RN d/t pt still with active bleeding, mobility not advised; will continue efforts to see pt    [facility] 02/11/2019, 11:57 AM

## 2019-02-11 NOTE — Progress Notes (Signed)
PROGRESS NOTE    John Peck  GUY:[id number] DOB: November 25, 1923 DOA: 02/10/2019 PCP: Seward Carol, MD   Brief Narrative: John Peck is a 85 y.o. male with a history of hypertension, sick sinus syndrome status post pacemaker, hyperlipidemia, chronic diastolic heart failure, CVA, COPD, CKD stage III, paroxysmal atrial fibrillation, hypothyroidism, dementia.  Patient presented secondary to noticing bright red blood per rectum.  Concern for GI bleed.  GI consulted.  He has received 1 unit of PRBC today.   Assessment & Plan:   Principal Problem:   GI bleed Active Problems:   Essential hypertension   Sick sinus syndrome (HCC)   Hyperlipidemia   Chronic systolic CHF (congestive heart failure) (HCC)   COPD clinical dx/ no pfts on record   CKD (chronic kidney disease), stage III (HCC)   Paroxysmal atrial fibrillation (HCC)   Hypothyroidism   Dementia (HCC)   Bright red blood per rectum Concern for lower GI bleed.  Patient with recurrent hematochezia overnight.  COPD Patient with some shortness of breath this morning. Currently wheezing. No cough. On room air.  Low concern for COVID-19. -Albuterol q6 hours  Acute blood loss anemia on chronic anemia Normocytic. In setting of CKD and acute blood loss from hematochezia.  Baseline hemoglobin around 8.  S/p 1 unit of PRBC overnight for a hemoglobin of 6.4. Up to 7.3, but had recurrent bloody stool this morning.  Iron studies suggest anemia of chronic disease. -Repeat H&H this morning -CBC daily, more frequent with recurrent hematochezia episodes  Hypothyroidism -Continue Synthroid  Paroxysmal atrial fibrillation Currently rate controlled.  Regular rhythm.  Not on anticoagulation but is on aspirin and Plavix as an outpatient. -Continue Coreg  Sick sinus syndrome Patient is status post pacemaker.  Essential hypertension Patient is on Imdur, Coreg as an outpatient. -Continue Coreg -Hold Imdur in setting of active GI bleed  and possible hypotension  Hyperlipidemia -Continue Lipitor  Chronic diastolic heart failure Currently stable.  Not on diuretic therapy as an outpatient.  Currently on Coreg. -Continue Coreg -Watch daily weights in addition to strict in and out  Poor hearing Noted. Difficult to communicate with patient at times.  History of CVA Currently on aspirin and Plavix as an outpatient. Held secondary to active GI bleeding. Will need to be addressed prior to discharge.   DVT prophylaxis: SCDs Code Status:   Code Status: DNR Family Communication: None at bedside Disposition Plan: Discharge pending GI workup for hematochezia   Consultants:   [facility]  Procedures:   None  Antimicrobials:  None    Subjective: Patient feels mildly short of breath. Wants to use his inhaler. Per nurse report, episode of large bloody BM overnight.  Objective: Vitals:   02/10/19 1656 02/10/19 1757 02/10/19 2026 02/11/19 0404  BP: (!) 154/77 (!) 181/88 (!) 163/80 (!) 155/72  Pulse: 60 63 60 63  Resp: 14 14 18 18   Temp: 98 F (36.7 C) 97.7 F (36.5 C) 98.6 F (37 C) 98 F (36.7 C)  TempSrc: Oral Oral Oral Oral  SpO2: 100% 100% 96% 97%  Weight: 62.1 kg   63.3 kg  Height: 5\' 9"  (1.753 m)       Intake/Output Summary (Last 24 hours) at 02/11/2019 0914 Last data filed at 02/11/2019 0407 Gross per 24 hour  Intake 1259.83 ml  Output 400 ml  Net 859.83 ml   Filed Weights   02/10/19 1656 02/11/19 0404  Weight: 62.1 kg 63.3 kg    Examination:  General exam: Appears  calm and comfortable Respiratory system: Wheezing bilaterally with diminished breath sounds. Respiratory effort normal. Cardiovascular system: S1 & S2 heard, RRR. No murmurs, rubs, gallops or clicks. Gastrointestinal system: Abdomen is nondistended, soft and nontender. No organomegaly or masses felt. Normal bowel sounds heard. Central nervous system: Alert. No focal neurological deficits. Extremities: No edema. No calf tenderness  Skin: No cyanosis. No rashes. Sebaceous cyst of left face Psychiatry: Judgement and insight appear normal. Mood & affect appropriate.     Data Reviewed: I have personally reviewed following labs and imaging studies  CBC: Recent Labs  Lab 02/10/19 1200 02/11/19 0528  WBC 4.9 5.2  NEUTROABS 3.5  --   HGB 6.4* 7.3*  HCT 20.9* 23.5*  MCV 105.0* 99.6  PLT 165 671   Basic Metabolic Panel: Recent Labs  Lab 02/10/19 1200 02/11/19 0528  NA 140 139  K 4.5 4.5  CL 112* 111  CO2 22 20*  GLUCOSE 124* 75  BUN 40* 37*  CREATININE 1.85* 1.59*  CALCIUM 7.9* 8.0*   GFR: Estimated Creatinine Clearance: 26.5 mL/min (A) (by C-G formula based on SCr of 1.59 mg/dL (H)). Liver Function Tests: Recent Labs  Lab 02/10/19 1200 02/11/19 0528  AST 14* 12*  ALT 14 13  ALKPHOS 46 41  BILITOT 0.4 0.8  PROT 6.3* 6.2*  ALBUMIN 2.3* 2.3*   No results for input(s): LIPASE, AMYLASE in the last 168 hours. No results for input(s): AMMONIA in the last 168 hours. Coagulation Profile: Recent Labs  Lab 02/10/19 1200  INR 1.5*   Cardiac Enzymes: No results for input(s): CKTOTAL, CKMB, CKMBINDEX, TROPONINI in the last 168 hours. BNP (last 3 results) No results for input(s): PROBNP in the last 8760 hours. HbA1C: No results for input(s): HGBA1C in the last 72 hours. CBG: No results for input(s): GLUCAP in the last 168 hours. Lipid Profile: No results for input(s): CHOL, HDL, LDLCALC, TRIG, CHOLHDL, LDLDIRECT in the last 72 hours. Thyroid Function Tests: No results for input(s): TSH, T4TOTAL, FREET4, T3FREE, THYROIDAB in the last 72 hours. Anemia Panel: Recent Labs    02/11/19 0525  FERRITIN 263  TIBC 149*  IRON 37*   Sepsis Labs: No results for input(s): PROCALCITON, LATICACIDVEN in the last 168 hours.  No results found for this or any previous visit (from the past 240 hour(s)).       Radiology Studies: No results found.      Scheduled Meds: . albuterol  2 puff Inhalation  Q6H  . atorvastatin  20 mg Oral q1800  . carvedilol  3.125 mg Oral BID WC  . levothyroxine  125 mcg Oral Q0600  . pantoprazole  40 mg Oral Daily  . tamsulosin  0.4 mg Oral Daily  . traZODone  150 mg Oral QHS  . vitamin C  500 mg Oral Daily   Continuous Infusions:   LOS: 0 days     Cordelia Poche, MD [facility] 02/11/2019, 9:14 AM  If 7PM-7AM, please contact night-coverage [email or web address]

## 2019-02-12 LAB — CBC
HCT: 22.2 % — ABNORMAL LOW (ref 39.0–52.0)
HCT: 23.4 % — ABNORMAL LOW (ref 39.0–52.0)
Hemoglobin: 7.1 g/dL — ABNORMAL LOW (ref 13.0–17.0)
Hemoglobin: 7.3 g/dL — ABNORMAL LOW (ref 13.0–17.0)
MCH: 31.6 pg (ref 26.0–34.0)
MCH: 31.2 pg (ref 26.0–34.0)
MCHC: 32 g/dL (ref 30.0–36.0)
MCHC: 31.2 g/dL (ref 30.0–36.0)
MCV: 98.7 fL (ref 80.0–100.0)
MCV: 100 fL (ref 80.0–100.0)
Platelets: 149 10*3/uL — ABNORMAL LOW (ref 150–400)
Platelets: 142 10*3/uL — ABNORMAL LOW (ref 150–400)
RBC: 2.25 MIL/uL — ABNORMAL LOW (ref 4.22–5.81)
RBC: 2.34 MIL/uL — ABNORMAL LOW (ref 4.22–5.81)
RDW: 18.6 % — ABNORMAL HIGH (ref 11.5–15.5)
RDW: 18.3 % — ABNORMAL HIGH (ref 11.5–15.5)
WBC: 5.2 10*3/uL (ref 4.0–10.5)
WBC: 7 10*3/uL (ref 4.0–10.5)
nRBC: 0 % (ref 0.0–0.2)
nRBC: 0 % (ref 0.0–0.2)

## 2019-02-12 MED ORDER — HYDRALAZINE HCL 20 MG/ML IJ SOLN: 5.0000 mg | Freq: Four times a day (QID) | INTRAMUSCULAR | Status: DC | PRN

## 2019-02-12 NOTE — Progress Notes (Signed)
PROGRESS NOTE    John Peck  QQP:[id number] DOB: 13-Oct-1924 DOA: 02/10/2019 PCP: Seward Carol, MD   Brief Narrative: John Peck is a 85 y.o. male with a history of hypertension, sick sinus syndrome status post pacemaker, hyperlipidemia, chronic diastolic heart failure, CVA, COPD, CKD stage III, paroxysmal atrial fibrillation, hypothyroidism, dementia.  Patient presented secondary to noticing bright red blood per rectum.  Concern for GI bleed.  GI consulted.  He has received 1 unit of PRBC today.   Assessment & Plan:   Principal Problem:   GI bleed Active Problems:   Essential hypertension   Sick sinus syndrome (HCC)   Hyperlipidemia   Chronic systolic CHF (congestive heart failure) (HCC)   COPD clinical dx/ no pfts on record   CKD (chronic kidney disease), stage III (HCC)   Paroxysmal atrial fibrillation (HCC)   Hypothyroidism   Dementia (HCC)   Bright red blood per rectum Concern for lower GI bleed.  Patient with recurrent hematochezia overnight, less than previously. -CBC q12 hours -GI recommendations: monitor for now -Will transfuse 1 unit of PRBC secondary to heart disease, downtrending hemoglobin and continued rectal bleeding.  COPD Patient with some shortness of breath this morning. Mild cough. On room air.  Low concern for COVID-19. Wheezing improved today. -Albuterol q6 hours  Acute blood loss anemia on chronic anemia Normocytic. In setting of CKD and acute blood loss from hematochezia.  Baseline hemoglobin around 8.  S/p 1 unit of PRBC for a hemoglobin of 6.4. Up to 7.3. Continues to have continued bloody stool. Hemoglobin down today. -CBC BID -PRBC as mentioned above  Hypothyroidism -Continue Synthroid  Paroxysmal atrial fibrillation Currently rate controlled.  Regular rhythm.  Not on anticoagulation but is on aspirin and Plavix as an outpatient. -Continue Coreg -GI recommendations for antiplatelet therapy resumption  Sick sinus syndrome  Patient is status post pacemaker.  Essential hypertension Patient is on Imdur, Coreg as an outpatient. -Continue Coreg -Hold Imdur in setting of active GI bleed and possible hypotension -Hydralazine IV prn  Hyperlipidemia -Continue Lipitor  Chronic diastolic heart failure Currently stable.  Not on diuretic therapy as an outpatient.  Currently on Coreg. -Continue Coreg -Watch daily weights in addition to strict in and out  Poor hearing Noted. Difficult to communicate with patient at times.  History of CVA Currently on aspirin and Plavix as an outpatient. Held secondary to active GI bleeding. Will need to be addressed prior to discharge.   DVT prophylaxis: SCDs Code Status:   Code Status: DNR Family Communication: None at bedside Disposition Plan: Discharge pending GI workup for hematochezia and PT eval   Consultants:   [facility]  Procedures:   None  Antimicrobials:  None    Subjective: No issues today. Not feeling short of breath.  Objective: Vitals:   02/11/19 2130 02/11/19 2130 02/12/19 0000 02/12/19 0500  BP:  (!) 183/85 (!) 155/63 (!) 155/65  Pulse:  60 61 65  Resp:  20  20  Temp:  98 F (36.7 C)  98.7 F (37.1 C)  TempSrc:    Oral  SpO2: 94% 95%  98%  Weight:    59.8 kg  Height:        Intake/Output Summary (Last 24 hours) at 02/12/2019 1016 Last data filed at 02/12/2019 0756 Gross per 24 hour  Intake 1080 ml  Output 1200 ml  Net -120 ml   Filed Weights   02/10/19 1656 02/11/19 0404 02/12/19 0500  Weight: 62.1 kg 63.3 kg 59.8 kg  Examination:  General exam: Appears calm and comfortable Respiratory system: Clear to auscultation. Respiratory effort normal. Cardiovascular system: S1 & S2 heard, RRR. No murmurs, rubs, gallops or clicks. Gastrointestinal system: Abdomen is nondistended, soft and nontender. No organomegaly or masses felt. Normal bowel sounds heard. Central nervous system: Alert. No focal neurological deficits. Extremities: No  edema. No calf tenderness Skin: No cyanosis. No rashes Psychiatry: Judgement and insight appear normal. Mood & affect appropriate.     Data Reviewed: I have personally reviewed following labs and imaging studies  CBC: Recent Labs  Lab 02/10/19 1200 02/11/19 0528 02/11/19 0944 02/11/19 1647 02/12/19 0312  WBC 4.9 5.2  --  5.8 5.2  NEUTROABS 3.5  --   --   --   --   HGB 6.4* 7.3* 7.6* 7.5* 7.1*  HCT 20.9* 23.5* 24.0* 23.8* 22.2*  MCV 105.0* 99.6  --  98.8 98.7  PLT 165 155  --  153 035*   Basic Metabolic Panel: Recent Labs  Lab 02/10/19 1200 02/11/19 0528  NA 140 139  K 4.5 4.5  CL 112* 111  CO2 22 20*  GLUCOSE 124* 75  BUN 40* 37*  CREATININE 1.85* 1.59*  CALCIUM 7.9* 8.0*   GFR: Estimated Creatinine Clearance: 25.1 mL/min (A) (by C-G formula based on SCr of 1.59 mg/dL (H)). Liver Function Tests: Recent Labs  Lab 02/10/19 1200 02/11/19 0528  AST 14* 12*  ALT 14 13  ALKPHOS 46 41  BILITOT 0.4 0.8  PROT 6.3* 6.2*  ALBUMIN 2.3* 2.3*   No results for input(s): LIPASE, AMYLASE in the last 168 hours. No results for input(s): AMMONIA in the last 168 hours. Coagulation Profile: Recent Labs  Lab 02/10/19 1200  INR 1.5*   Cardiac Enzymes: No results for input(s): CKTOTAL, CKMB, CKMBINDEX, TROPONINI in the last 168 hours. BNP (last 3 results) No results for input(s): PROBNP in the last 8760 hours. HbA1C: No results for input(s): HGBA1C in the last 72 hours. CBG: No results for input(s): GLUCAP in the last 168 hours. Lipid Profile: No results for input(s): CHOL, HDL, LDLCALC, TRIG, CHOLHDL, LDLDIRECT in the last 72 hours. Thyroid Function Tests: No results for input(s): TSH, T4TOTAL, FREET4, T3FREE, THYROIDAB in the last 72 hours. Anemia Panel: Recent Labs    02/11/19 0525  FERRITIN 263  TIBC 149*  IRON 37*   Sepsis Labs: No results for input(s): PROCALCITON, LATICACIDVEN in the last 168 hours.  No results found for this or any previous visit (from  the past 240 hour(s)).       Radiology Studies: No results found.      Scheduled Meds: . atorvastatin  20 mg Oral q1800  . carvedilol  3.125 mg Oral BID WC  . ipratropium-albuterol  3 mL Nebulization BID  . levothyroxine  125 mcg Oral Q0600  . pantoprazole  40 mg Oral Daily  . tamsulosin  0.4 mg Oral Daily  . traZODone  150 mg Oral QHS  . vitamin C  500 mg Oral Daily   Continuous Infusions:   LOS: 1 day     Cordelia Poche, MD [facility] 02/12/2019, 10:16 AM  If 7PM-7AM, please contact night-coverage [email or web address]

## 2019-02-12 NOTE — Evaluation (Signed)
Physical Therapy Evaluation Patient Details Name: ZHANE BLUITT MRN: [id number] DOB: 01-Jun-1924 Today's Date: 02/12/2019   History of Present Illness  Pt admitted with GIB and with hx of CVA, COPD, CKD, a-fib, dementia, and pacemaker  Clinical Impression  Pt admitted as above and presenting with functional mobility limitations 2* generalized weakness and balance deficits.  Pt would benefit from follow up rehab at SNF level to maximize IND and safety.    Follow Up Recommendations SNF    Equipment Recommendations  None recommended by PT    Recommendations for Other Services       Precautions / Restrictions Precautions Precautions: Fall Restrictions Weight Bearing Restrictions: No      Mobility  Bed Mobility Overal bed mobility: Needs Assistance Bed Mobility: Supine to Sit     Supine to sit: Min assist;+2 for physical assistance;+2 for safety/equipment     General bed mobility comments: Increased time with min assist to manage LEs over side of bed and to bring trunk upright and maintain initial balance sitting EOB  Transfers Overall transfer level: Needs assistance Equipment used: Rolling walker (2 wheeled) Transfers: Sit to/from Stand Sit to Stand: Min assist;+2 physical assistance;+2 safety/equipment;From elevated surface         General transfer comment: cues for use of UEs to self assist.  Ambulation/Gait Ambulation/Gait assistance: Min assist;+2 physical assistance;+2 safety/equipment Gait Distance (Feet): 6 Feet Assistive device: Rolling walker (2 wheeled) Gait Pattern/deviations: Step-through pattern;Decreased step length - right;Decreased step length - left;Shuffle;Trunk flexed Gait velocity: decr   General Gait Details: cues for posture and position from RW; distance ltd by weakness/fatigue; "my legs are just done"  Stairs            Wheelchair Mobility    Modified Rankin (Stroke Patients Only)       Balance Overall balance assessment:  Needs assistance Sitting-balance support: Bilateral upper extremity supported;Feet supported Sitting balance-Leahy Scale: Fair     Standing balance support: Bilateral upper extremity supported Standing balance-Leahy Scale: Poor                               Pertinent Vitals/Pain Pain Assessment: No/denies pain    Home Living Family/patient expects to be discharged to:: Skilled nursing facility                      Prior Function Level of Independence: Needs assistance   Gait / Transfers Assistance Needed: Pt states he ambulates at facility with RW  ADL's / Homemaking Assistance Needed: Pt reports he receives assist with bed mobility, toileting, dressing, cleaning, and ambulating. Pt reports "they help me with pretty much everything"        Hand Dominance   Dominant Hand: Right    Extremity/Trunk Assessment   Upper Extremity Assessment Upper Extremity Assessment: Generalized weakness    Lower Extremity Assessment Lower Extremity Assessment: Generalized weakness    Cervical / Trunk Assessment Cervical / Trunk Assessment: Kyphotic  Communication   Communication: HOH  Cognition Arousal/Alertness: Awake/alert Behavior During Therapy: WFL for tasks assessed/performed Overall Cognitive Status: History of cognitive impairments - at baseline                                        General Comments      Exercises     Assessment/Plan    PT  Assessment Patient needs continued PT services  PT Problem List Decreased strength;Decreased range of motion;Decreased activity tolerance;Decreased balance;Decreased mobility;Decreased cognition;Decreased knowledge of use of DME;Decreased safety awareness       PT Treatment Interventions DME instruction;Gait training;Therapeutic activities;Functional mobility training;Therapeutic exercise;Balance training;Patient/family education    PT Goals (Current goals can be found in the Care Plan  section)  Acute Rehab PT Goals Patient Stated Goal: Get out of bed PT Goal Formulation: Patient unable to participate in goal setting Time For Goal Achievement: 02/26/19 Potential to Achieve Goals: Fair    Frequency Min 3X/week   Barriers to discharge        Co-evaluation               AM-PAC PT "6 Clicks" Mobility  Outcome Measure Help needed turning from your back to your side while in a flat bed without using bedrails?: A Little Help needed moving from lying on your back to sitting on the side of a flat bed without using bedrails?: A Lot Help needed moving to and from a bed to a chair (including a wheelchair)?: A Lot Help needed standing up from a chair using your arms (e.g., wheelchair or bedside chair)?: A Lot Help needed to walk in hospital room?: A Lot Help needed climbing 3-5 steps with a railing? : Total 6 Click Score: 12    End of Session Equipment Utilized During Treatment: Gait belt Activity Tolerance: Patient limited by fatigue Patient left: in chair;with call bell/phone within reach;with chair alarm set Nurse Communication: Mobility status PT Visit Diagnosis: Difficulty in walking, not elsewhere classified (R26.2)    Time: 1350-1407 PT Time Calculation (min) (ACUTE ONLY): 17 min   Charges:   PT Evaluation $PT Eval Low Complexity: [place] Pager [phone] Office [phone]   Daily Doe 02/12/2019, 3:17 PM

## 2019-02-12 NOTE — Progress Notes (Signed)
[facility] Progress Note  Subjective: Some rectal bleeding reported last evening but none today.   Objective: Vital signs in last 24 hours: Temp:  [98 F (36.7 C)-98.7 F (37.1 C)] 98.7 F (37.1 C) (04/05 0500) Pulse Rate:  [60-65] 65 (04/05 0500) Resp:  [16-20] 20 (04/05 0500) BP: (155-183)/(63-85) 155/65 (04/05 0500) SpO2:  [94 %-98 %] 98 % (04/05 0500) Weight:  [59.8 kg] 59.8 kg (04/05 0500) Weight change: -2.3 kg   PE:  No distress  Lab Results: Results for orders placed or performed during the hospital encounter of 02/10/19 (from the past 24 hour(s))  CBC     Status: Abnormal   Collection Time: 02/11/19  4:47 PM  Result Value Ref Range   WBC 5.8 4.0 - 10.5 K/uL   RBC 2.41 (L) 4.22 - 5.81 MIL/uL   Hemoglobin 7.5 (L) 13.0 - 17.0 g/dL   HCT 23.8 (L) 39.0 - 52.0 %   MCV 98.8 80.0 - 100.0 fL   MCH 31.1 26.0 - 34.0 pg   MCHC 31.5 30.0 - 36.0 g/dL   RDW 19.3 (H) 11.5 - 15.5 %   Platelets 153 150 - 400 K/uL   nRBC 0.0 0.0 - 0.2 %  CBC     Status: Abnormal   Collection Time: 02/12/19  3:12 AM  Result Value Ref Range   WBC 5.2 4.0 - 10.5 K/uL   RBC 2.25 (L) 4.22 - 5.81 MIL/uL   Hemoglobin 7.1 (L) 13.0 - 17.0 g/dL   HCT 22.2 (L) 39.0 - 52.0 %   MCV 98.7 80.0 - 100.0 fL   MCH 31.6 26.0 - 34.0 pg   MCHC 32.0 30.0 - 36.0 g/dL   RDW 18.6 (H) 11.5 - 15.5 %   Platelets 149 (L) 150 - 400 K/uL   nRBC 0.0 0.0 - 0.2 %    Studies/Results: No results found.    Assessment: Rectal bleeding  Plan:   Continue conservative medical therapy.if significant bleeding were to occur would recommend nuclear medicine GI bleeding scan as next step and if positive angiogram with embolization by interventional radiology.    Cassell Clement 02/12/2019, 11:43 AM  Pager: [phone] If no answer or after 5 PM call [phone] Lab Results  Component Value Date   HGB 7.1 (L) 02/12/2019   HGB 7.5 (L) 02/11/2019   HGB 7.6 (L) 02/11/2019   HGB 10.3 (L) 12/10/2017   HGB  10.8 (L) 07/28/2017   HGB 11.0 (L) 12/25/2016   HGB 10.2 (L) 07/17/2016   HGB 11.9 (L) 03/17/2016   HCT 22.2 (L) 02/12/2019   HCT 23.8 (L) 02/11/2019   HCT 24.0 (L) 02/11/2019   HCT 31.8 (L) 12/10/2017   HCT 34.1 (L) 07/28/2017   HCT 32.2 (L) 12/25/2016   HCT 30.3 (L) 07/17/2016   HCT 36.3 (L) 03/17/2016   ALKPHOS 41 02/11/2019   ALKPHOS 46 02/10/2019   ALKPHOS 43 11/29/2018   ALKPHOS 92 01/19/2014   ALKPHOS 102 07/21/2013   ALKPHOS 113 01/20/2013   AST 12 (L) 02/11/2019   AST 14 (L) 02/10/2019   AST 18 11/29/2018   AST 11 01/19/2014   AST 11 07/21/2013   AST 9 01/20/2013   ALT 13 02/11/2019   ALT 14 02/10/2019   ALT 14 11/29/2018   ALT 8 01/19/2014   ALT 7 07/21/2013   ALT 7 01/20/2013

## 2019-02-12 NOTE — Progress Notes (Signed)
Pt did not have a BM this shift. No blood noted.

## 2019-02-13 LAB — CBC
HCT: 19.3 % — ABNORMAL LOW (ref 39.0–52.0)
HCT: 23.9 % — ABNORMAL LOW (ref 39.0–52.0)
Hemoglobin: 6.2 g/dL — CL (ref 13.0–17.0)
Hemoglobin: 7.6 g/dL — ABNORMAL LOW (ref 13.0–17.0)
MCH: 31.6 pg (ref 26.0–34.0)
MCH: 31.5 pg (ref 26.0–34.0)
MCHC: 32.1 g/dL (ref 30.0–36.0)
MCHC: 31.8 g/dL (ref 30.0–36.0)
MCV: 98.5 fL (ref 80.0–100.0)
MCV: 99.2 fL (ref 80.0–100.0)
Platelets: 136 10*3/uL — ABNORMAL LOW (ref 150–400)
Platelets: 138 10*3/uL — ABNORMAL LOW (ref 150–400)
RBC: 1.96 MIL/uL — ABNORMAL LOW (ref 4.22–5.81)
RBC: 2.41 MIL/uL — ABNORMAL LOW (ref 4.22–5.81)
RDW: 17.8 % — ABNORMAL HIGH (ref 11.5–15.5)
RDW: 17.4 % — ABNORMAL HIGH (ref 11.5–15.5)
WBC: 5.2 10*3/uL (ref 4.0–10.5)
WBC: 6.1 10*3/uL (ref 4.0–10.5)
nRBC: 0 % (ref 0.0–0.2)
nRBC: 0 % (ref 0.0–0.2)

## 2019-02-13 LAB — PREPARE RBC (CROSSMATCH)

## 2019-02-13 MED ORDER — POLYETHYLENE GLYCOL 3350 17 G PO PACK
17.0000 g | PACK | Freq: Two times a day (BID) | ORAL | Status: DC
  Administered 2019-02-13 – 2019-02-17 (×8): 17 g via ORAL
  Filled 2019-02-13 (×8): qty 1

## 2019-02-13 MED ORDER — SODIUM CHLORIDE 0.9% IV SOLUTION: Freq: Once | INTRAVENOUS | Status: DC

## 2019-02-13 NOTE — Progress Notes (Signed)
PROGRESS NOTE    John Peck  UMP:[id number] DOB: Mar 26, 1924 DOA: 02/10/2019 PCP: Seward Carol, MD   Brief Narrative: John Peck is a 85 y.o. male with a history of hypertension, sick sinus syndrome status post pacemaker, hyperlipidemia, chronic diastolic heart failure, CVA, COPD, CKD stage III, paroxysmal atrial fibrillation, hypothyroidism, dementia.  Patient presented secondary to noticing bright red blood per rectum.  Concern for GI bleed.  GI consulted.  He has received 2 unit of PRBC to date.   Assessment & Plan:   Principal Problem:   GI bleed Active Problems:   Essential hypertension   Sick sinus syndrome (HCC)   Hyperlipidemia   Chronic systolic CHF (congestive heart failure) (HCC)   COPD clinical dx/ no pfts on record   CKD (chronic kidney disease), stage III (HCC)   Paroxysmal atrial fibrillation (HCC)   Hypothyroidism   Dementia (HCC)   Bright red blood per rectum Concern for lower GI bleed.  Patient again with hematochezia overnight. Hemoglobin dropped requiring repeat transfusion. -CBC q12 hours -GI recommendations: pending today  COPD Patient with some shortness of breath this morning. Mild cough. On room air.  Low concern for COVID-19. Wheezing improved today. -Albuterol q6 hours  Acute blood loss anemia on chronic anemia Normocytic. In setting of CKD and acute blood loss from hematochezia.  Baseline hemoglobin around 8.  S/p 1 unit of PRBC for a hemoglobin of 6.4. Up to 7.3. Continues to have continued bloody stool. Hemoglobin down significantly today. -CBC BID -PRBC as mentioned above  Hypothyroidism -Continue Synthroid  Paroxysmal atrial fibrillation Currently rate controlled.  Regular rhythm.  Not on anticoagulation but is on aspirin and Plavix as an outpatient. -Continue Coreg -GI recommendations for antiplatelet therapy resumption  Sick sinus syndrome Patient is status post pacemaker.  Essential hypertension Patient is on Imdur,  Coreg as an outpatient. -Continue Coreg -Hold Imdur in setting of active GI bleed and possible hypotension -Hydralazine IV prn  Hyperlipidemia -Continue Lipitor  Chronic diastolic heart failure Currently stable.  Not on diuretic therapy as an outpatient.  Currently on Coreg. -Continue Coreg -Watch daily weights in addition to strict in and out  Poor hearing Noted. Difficult to communicate with patient at times.  History of CVA Currently on aspirin and Plavix as an outpatient. Held secondary to active GI bleeding. Will need to be addressed prior to discharge.   DVT prophylaxis: SCDs Code Status:   Code Status: DNR Family Communication: None at bedside Disposition Plan: Discharge pending GI workup for hematochezia   Consultants:   [facility]  Procedures:   None  Antimicrobials:  None    Subjective: No concerns today. Bloody stool overnight  Objective: Vitals:   02/13/19 0535 02/13/19 0631 02/13/19 0649 02/13/19 0805  BP: 132/63 (!) 157/65 (!) 126/49   Pulse: 60 60 (!) 59 74  Resp: 18 (!) 24 17 16   Temp: 98.3 F (36.8 C) 99 F (37.2 C) 99.5 F (37.5 C)   TempSrc:  Oral Oral   SpO2: 96% 100% 98% 98%  Weight:      Height:        Intake/Output Summary (Last 24 hours) at 02/13/2019 0926 Last data filed at 02/13/2019 0229 Gross per 24 hour  Intake 300 ml  Output 1975 ml  Net -1675 ml   Filed Weights   02/10/19 1656 02/11/19 0404 02/12/19 0500  Weight: 62.1 kg 63.3 kg 59.8 kg    Examination:  General exam: Appears calm and comfortable Respiratory system: Clear to  auscultation but diminished. Respiratory effort normal. Cardiovascular system: S1 & S2 heard, RRR. No murmurs, rubs, gallops or clicks. Gastrointestinal system: Abdomen is nondistended, soft and nontender. No organomegaly or masses felt. Normal bowel sounds heard. Central nervous system: Alert and oriented. No focal neurological deficits. Extremities: No edema. No calf tenderness Skin: No  cyanosis. No rashes Psychiatry: Judgement and insight appear normal. Mood & affect appropriate.     Data Reviewed: I have personally reviewed following labs and imaging studies  CBC: Recent Labs  Lab 02/10/19 1200 02/11/19 0528 02/11/19 0944 02/11/19 1647 02/12/19 0312 02/12/19 1656 02/13/19 0505  WBC 4.9 5.2  --  5.8 5.2 7.0 5.2  NEUTROABS 3.5  --   --   --   --   --   --   HGB 6.4* 7.3* 7.6* 7.5* 7.1* 7.3* 6.2*  HCT 20.9* 23.5* 24.0* 23.8* 22.2* 23.4* 19.3*  MCV 105.0* 99.6  --  98.8 98.7 100.0 98.5  PLT 165 155  --  153 149* 142* 300*   Basic Metabolic Panel: Recent Labs  Lab 02/10/19 1200 02/11/19 0528  NA 140 139  K 4.5 4.5  CL 112* 111  CO2 22 20*  GLUCOSE 124* 75  BUN 40* 37*  CREATININE 1.85* 1.59*  CALCIUM 7.9* 8.0*   GFR: Estimated Creatinine Clearance: 25.1 mL/min (A) (by C-G formula based on SCr of 1.59 mg/dL (H)). Liver Function Tests: Recent Labs  Lab 02/10/19 1200 02/11/19 0528  AST 14* 12*  ALT 14 13  ALKPHOS 46 41  BILITOT 0.4 0.8  PROT 6.3* 6.2*  ALBUMIN 2.3* 2.3*   No results for input(s): LIPASE, AMYLASE in the last 168 hours. No results for input(s): AMMONIA in the last 168 hours. Coagulation Profile: Recent Labs  Lab 02/10/19 1200  INR 1.5*   Cardiac Enzymes: No results for input(s): CKTOTAL, CKMB, CKMBINDEX, TROPONINI in the last 168 hours. BNP (last 3 results) No results for input(s): PROBNP in the last 8760 hours. HbA1C: No results for input(s): HGBA1C in the last 72 hours. CBG: No results for input(s): GLUCAP in the last 168 hours. Lipid Profile: No results for input(s): CHOL, HDL, LDLCALC, TRIG, CHOLHDL, LDLDIRECT in the last 72 hours. Thyroid Function Tests: No results for input(s): TSH, T4TOTAL, FREET4, T3FREE, THYROIDAB in the last 72 hours. Anemia Panel: Recent Labs    02/11/19 0525  FERRITIN 263  TIBC 149*  IRON 37*   Sepsis Labs: No results for input(s): PROCALCITON, LATICACIDVEN in the last 168 hours.   No results found for this or any previous visit (from the past 240 hour(s)).       Radiology Studies: No results found.      Scheduled Meds: . sodium chloride   Intravenous Once  . atorvastatin  20 mg Oral q1800  . carvedilol  3.125 mg Oral BID WC  . ipratropium-albuterol  3 mL Nebulization BID  . levothyroxine  125 mcg Oral Q0600  . pantoprazole  40 mg Oral Daily  . tamsulosin  0.4 mg Oral Daily  . traZODone  150 mg Oral QHS  . vitamin C  500 mg Oral Daily   Continuous Infusions:   LOS: 2 days     Cordelia Poche, MD [facility] 02/13/2019, 9:26 AM  If 7PM-7AM, please contact night-coverage [email or web address]

## 2019-02-13 NOTE — NC FL2 (Signed)
[place] LEVEL OF CARE SCREENING TOOL     IDENTIFICATION  Patient Name: John Peck Birthdate: [birth year] Sex: male Admission Date (Current Location): 02/10/2019  [facility] and [place] Number:  Herbalist and Address:  [facility],  [place], [place]      Provider Number: [id number]  Attending Physician Name and Address:  Mariel Aloe, MD  Relative Name and Phone Number:  Dorian Pod Pelham dtr [phone])    Current Level of Care: Hospital Recommended Level of Care: Skilled Nursing Facility(Return back to [facility]) Prior Approval Number:    Date Approved/Denied:   PASRR Number:    Discharge Plan: SNF    Current Diagnoses: Patient Active Problem List   Diagnosis Date Noted  . GI bleed 02/10/2019  . Adult failure to thrive   . Palliative care by specialist   . DNR (do not resuscitate)   . Dysarthria 11/21/2018  . Acute on chronic systolic CHF (congestive heart failure) ([place]) 11/21/2018  . SOB (shortness of breath)   . Acidosis 11/17/2018  . Renal failure (ARF), acute on chronic (HCC) 11/15/2018  . Constipation 11/15/2018  . Pyuria 11/15/2018  . Dementia ([place]) 11/15/2018  . Dysphagia   . AKI (acute kidney injury) ([place])   . Altered mental status 08/09/2018  . Delirium 08/09/2018  . UTI (urinary tract infection) 08/09/2018  . Right flank pain   . Goals of care, counseling/discussion   . Palliative care encounter   . Chest pain 07/29/2018  . TIA (transient ischemic attack) 06/29/2018  . Pressure injury of skin 05/25/2018  . Pleural effusion 05/24/2018  . Acute respiratory failure with hypoxia ([place]) 05/24/2018  . NSTEMI (non-ST elevated myocardial infarction) ([place])   . Chronic systolic CHF (congestive heart failure) ([place]) 03/14/2018  . COPD clinical dx/ no pfts on record 03/14/2018  . H/O: GI bleed 03/14/2018  . CKD (chronic kidney disease), stage III ([place]) 03/14/2018  . Macrocytic anemia 03/14/2018  .  Paroxysmal atrial fibrillation ([place]) 03/14/2018  . Hypothyroidism 03/14/2018  . Carotid stenosis, right 03/14/2018  . Facial droop 02/22/2018  . Pancytopenia ([place]) 02/22/2018  . Generalized weakness   . Hypertensive heart disease   . Hyperlipidemia   . Stented coronary artery   . Coronary artery disease involving native coronary artery of native heart with unstable angina pectoris ([place])   . Reactive airway disease 11/27/2015  . CAD in native artery 09/03/2015  . Weakness 05/09/2015  . Hyperlipidemia LDL goal <70 02/28/2015  . Atherosclerosis of lower extremity with claudication ([place]) 07/17/2014  . PAF (paroxysmal atrial fibrillation) ([place]) 03/24/2013  . Lower extremity edema 03/24/2013  . Sinoatrial node dysfunction ([place]) 08/17/2012  . History of iron deficiency anemia 09/18/2011  . Essential hypertension 07/07/2009  . INTERMEDIATE CORONARY SYNDROME 07/07/2009  . S/P CABG x 2 '89. RCA stent'04. last cath Jan 2013 07/07/2009  . PACEMAKER, PERMANENT- St Jude Sept 2009 07/07/2009  . Sick sinus syndrome ([place]) 07/10/2008  . Pacemaker 07/10/2008    Orientation RESPIRATION BLADDER Height & Weight     Self, Place, Situation  Normal External catheter, Incontinent Weight: 59.8 kg Height:  5\' 9"  (175.3 cm)  BEHAVIORAL SYMPTOMS/MOOD NEUROLOGICAL BOWEL NUTRITION STATUS      Incontinent Diet(Thin)  AMBULATORY STATUS COMMUNICATION OF NEEDS Skin   Limited Assist Verbally Normal                       Personal Care Assistance Level of  Assistance  Bathing, Feeding, Dressing Bathing Assistance: Limited assistance Feeding assistance: Limited assistance Dressing Assistance: Limited assistance     Functional Limitations Info  Sight Sight Info: Impaired(eye glasses)        SPECIAL CARE FACTORS FREQUENCY  PT (By licensed PT), OT (By licensed OT)     PT Frequency: 5x week OT Frequency: 5x week            Contractures Contractures Info: Not present    Additional Factors  Info  Code Status Code Status Info: DNR             Current Medications (02/13/2019):  This is the current hospital active medication list Current Facility-Administered Medications  Medication Dose Route Frequency Provider Last Rate Last Dose  . 0.9 %  sodium chloride infusion (Manually program via Guardrails IV Fluids)   Intravenous Once Lovey Newcomer T, NP      . acetaminophen (TYLENOL) tablet 650 mg  650 mg Oral Q6H PRN Irene Pap N, DO   650 mg at 02/13/19 0433  . atorvastatin (LIPITOR) tablet 20 mg  20 mg Oral q1800 Irene Pap N, DO   20 mg at 02/12/19 1642  . carvedilol (COREG) tablet 3.125 mg  3.125 mg Oral BID WC Hall, Carole N, DO   3.125 mg at 02/13/19 3086  . hydrALAZINE (APRESOLINE) injection 5 mg  5 mg Intravenous Q6H PRN Mariel Aloe, MD      . ipratropium-albuterol (DUONEB) 0.5-2.5 (3) MG/3ML nebulizer solution 3 mL  3 mL Nebulization BID Mariel Aloe, MD   3 mL at 02/13/19 0805  . levothyroxine (SYNTHROID, LEVOTHROID) tablet 125 mcg  125 mcg Oral Q0600 Kayleen Memos, DO   125 mcg at 02/13/19 5784  . ondansetron (ZOFRAN) injection 4 mg  4 mg Intravenous Q6H PRN Irene Pap N, DO      . pantoprazole (PROTONIX) EC tablet 40 mg  40 mg Oral Daily Irene Pap N, DO   40 mg at 02/13/19 0936  . polyethylene glycol (MIRALAX / GLYCOLAX) packet 17 g  17 g Oral BID Laurence Spates, MD      . tamsulosin [facility]) capsule 0.4 mg  0.4 mg Oral Daily Irene Pap N, DO   0.4 mg at 02/13/19 6962  . traZODone (DESYREL) tablet 150 mg  150 mg Oral QHS Irene Pap N, DO   150 mg at 02/12/19 2128  . vitamin C (ASCORBIC ACID) tablet 500 mg  500 mg Oral Daily Irene Pap N, DO   500 mg at 02/13/19 9528     Discharge Medications: Please see discharge summary for a list of discharge medications.  Relevant Imaging Results:  Relevant Lab Results:   Additional Information ss# [place]  Mac Dowdell, [name], [place]

## 2019-02-13 NOTE — NC FL2 (Signed)
[place] LEVEL OF CARE SCREENING TOOL     IDENTIFICATION  Patient Name: John Peck Birthdate: 1924/11/04 Sex: male Admission Date (Current Location): 02/10/2019  [facility] and [place] Number:  Herbalist and Address:  [facility],  [place], [place]      Provider Number: [id number]  Attending Physician Name and Address:  Mariel Aloe, MD  Relative Name and Phone Number:  John Peck dtr [phone])    Current Level of Care: Hospital Recommended Level of Care: Skilled Nursing Facility(Return back to [facility]) Prior Approval Number:    Date Approved/Denied:   PASRR Number:    Discharge Plan: SNF    Current Diagnoses: Patient Active Problem List   Diagnosis Date Noted  . GI bleed 02/10/2019  . Adult failure to thrive   . Palliative care by specialist   . DNR (do not resuscitate)   . Dysarthria 11/21/2018  . Acute on chronic systolic CHF (congestive heart failure) ([place]) 11/21/2018  . SOB (shortness of breath)   . Acidosis 11/17/2018  . Renal failure (ARF), acute on chronic (HCC) 11/15/2018  . Constipation 11/15/2018  . Pyuria 11/15/2018  . Dementia ([place]) 11/15/2018  . Dysphagia   . AKI (acute kidney injury) ([place])   . Altered mental status 08/09/2018  . Delirium 08/09/2018  . UTI (urinary tract infection) 08/09/2018  . Right flank pain   . Goals of care, counseling/discussion   . Palliative care encounter   . Chest pain 07/29/2018  . TIA (transient ischemic attack) 06/29/2018  . Pressure injury of skin 05/25/2018  . Pleural effusion 05/24/2018  . Acute respiratory failure with hypoxia ([place]) 05/24/2018  . NSTEMI (non-ST elevated myocardial infarction) ([place])   . Chronic systolic CHF (congestive heart failure) ([place]) 03/14/2018  . COPD clinical dx/ no pfts on record 03/14/2018  . H/O: GI bleed 03/14/2018  . CKD (chronic kidney disease), stage III ([place]) 03/14/2018  . Macrocytic anemia 03/14/2018  .  Paroxysmal atrial fibrillation ([place]) 03/14/2018  . Hypothyroidism 03/14/2018  . Carotid stenosis, right 03/14/2018  . Facial droop 02/22/2018  . Pancytopenia ([place]) 02/22/2018  . Generalized weakness   . Hypertensive heart disease   . Hyperlipidemia   . Stented coronary artery   . Coronary artery disease involving native coronary artery of native heart with unstable angina pectoris ([place])   . Reactive airway disease 11/27/2015  . CAD in native artery 09/03/2015  . Weakness 05/09/2015  . Hyperlipidemia LDL goal <70 02/28/2015  . Atherosclerosis of lower extremity with claudication ([place]) 07/17/2014  . PAF (paroxysmal atrial fibrillation) ([place]) 03/24/2013  . Lower extremity edema 03/24/2013  . Sinoatrial node dysfunction ([place]) 08/17/2012  . History of iron deficiency anemia 09/18/2011  . Essential hypertension 07/07/2009  . INTERMEDIATE CORONARY SYNDROME 07/07/2009  . S/P CABG x 2 '89. RCA stent'04. last cath Jan 2013 07/07/2009  . PACEMAKER, PERMANENT- St Jude Sept 2009 07/07/2009  . Sick sinus syndrome ([place]) 07/10/2008  . Pacemaker 07/10/2008    Orientation RESPIRATION BLADDER Height & Weight     Self, Place, Situation  Normal External catheter, Incontinent Weight: 59.8 kg Height:  5\' 9"  (175.3 cm)  BEHAVIORAL SYMPTOMS/MOOD NEUROLOGICAL BOWEL NUTRITION STATUS      Incontinent Diet(Thin)  AMBULATORY STATUS COMMUNICATION OF NEEDS Skin   Limited Assist Verbally Normal                       Personal Care Assistance Level of  Assistance  Bathing, Feeding, Dressing Bathing Assistance: Limited assistance Feeding assistance: Limited assistance Dressing Assistance: Limited assistance     Functional Limitations Info  Sight Sight Info: Impaired(eye glasses)        SPECIAL CARE FACTORS FREQUENCY  PT (By licensed PT), OT (By licensed OT)     PT Frequency: 5x week OT Frequency: 5x week            Contractures Contractures Info: Not present    Additional Factors  Info  Code Status Code Status Info: DNR             Current Medications (02/13/2019):  This is the current hospital active medication list Current Facility-Administered Medications  Medication Dose Route Frequency Provider Last Rate Last Dose  . 0.9 %  sodium chloride infusion (Manually program via Guardrails IV Fluids)   Intravenous Once Lovey Newcomer T, NP      . acetaminophen (TYLENOL) tablet 650 mg  650 mg Oral Q6H PRN Irene Pap N, DO   650 mg at 02/13/19 0433  . atorvastatin (LIPITOR) tablet 20 mg  20 mg Oral q1800 Irene Pap N, DO   20 mg at 02/12/19 1642  . carvedilol (COREG) tablet 3.125 mg  3.125 mg Oral BID WC Hall, Carole N, DO   3.125 mg at 02/13/19 6967  . hydrALAZINE (APRESOLINE) injection 5 mg  5 mg Intravenous Q6H PRN Mariel Aloe, MD      . ipratropium-albuterol (DUONEB) 0.5-2.5 (3) MG/3ML nebulizer solution 3 mL  3 mL Nebulization BID Mariel Aloe, MD   3 mL at 02/13/19 0805  . levothyroxine (SYNTHROID, LEVOTHROID) tablet 125 mcg  125 mcg Oral Q0600 Kayleen Memos, DO   125 mcg at 02/13/19 8938  . ondansetron (ZOFRAN) injection 4 mg  4 mg Intravenous Q6H PRN Irene Pap N, DO      . pantoprazole (PROTONIX) EC tablet 40 mg  40 mg Oral Daily Irene Pap N, DO   40 mg at 02/13/19 0936  . polyethylene glycol (MIRALAX / GLYCOLAX) packet 17 g  17 g Oral BID Laurence Spates, MD      . tamsulosin [facility]) capsule 0.4 mg  0.4 mg Oral Daily Irene Pap N, DO   0.4 mg at 02/13/19 1017  . traZODone (DESYREL) tablet 150 mg  150 mg Oral QHS Irene Pap N, DO   150 mg at 02/12/19 2128  . vitamin C (ASCORBIC ACID) tablet 500 mg  500 mg Oral Daily Irene Pap N, DO   500 mg at 02/13/19 5102     Discharge Medications: Please see discharge summary for a list of discharge medications.  Relevant Imaging Results:  Relevant Lab Results:   Additional Information ss# [place]  John Peck, John Peck, [place]

## 2019-02-13 NOTE — Progress Notes (Signed)
CRITICAL VALUE STICKER  CRITICAL VALUE: Hemoglobin 6.2  RECEIVER (on-site recipient of call): P. Keiondre Colee RN  DATE & TIME NOTIFIED: 02/13/19  0558  MESSENGER (representative from lab): McCoy  MD NOTIFIED: [place]  TIME OF NOTIFICATION: 0559  RESPONSE: see new orders    02/13/19 0535  Vitals  Temp 98.3 F (36.8 C)  BP 132/63  MAP (mmHg) 84  BP Location Right Arm  BP Method Automatic  Patient Position (if appropriate) Lying  Pulse Rate 60  Resp 18  Oxygen Therapy  SpO2 96 %  O2 Device Room Air  MEWS Score  MEWS RR 0  MEWS Pulse 0  MEWS Systolic 0  MEWS LOC 0  MEWS Temp 0  MEWS Score 0  MEWS Score Color Green  Provider Notification  Provider Name/Title blount  Date Provider Notified 02/13/19  Time Provider Notified [phone]  Notification Type Page  Notification Reason Other (Comment) (Critical Value : hgb 6.2)   Pt had two bloody medium stools overnight.

## 2019-02-13 NOTE — TOC Transition Note (Signed)
Transition of Care [facility]) - CM/SW Discharge Note   Patient Details  Name: John Peck MRN: [id number] Date of Birth: Jul 31, 1924  [facility] of [facility]) CM/SW Contact:  Dessa Phi, RN Phone Number: 02/13/2019, 1:56 PM   Clinical Narrative: Patient to return back to [place]-fl2 faxed for continued care-rep Kirsten aware & following. Noted GI following,may consider palliative cons.      Final next level of care: [place] Barriers to Discharge: Continued Medical Work up   Patient Goals and CMS Choice Patient states their goals for this hospitalization and ongoing recovery are:: (return back to [place]) [employer].gov Compare Post Acute Care list provided to:: Patient Represenative (must comment)    Discharge Placement              Patient chooses bed at: (Came from [facility]) Patient to be transferred to facility by: Corey Harold)      Discharge Plan and Services   Discharge Planning Services: CM Consult                      Social Determinants of Health (SDOH) Interventions     Readmission Risk Interventions No flowsheet data found.

## 2019-02-13 NOTE — Progress Notes (Signed)
Physical Therapy Treatment Patient Details Name: John Peck MRN: [id number] DOB: 1924/07/01 Today's Date: 02/13/2019    History of Present Illness Pt admitted with GIB and with hx of CVA, COPD, CKD, a-fib, dementia, and pacemaker    PT Comments    Pt assisted out of bed and transferred to recliner.  Pt overall requiring mod assist (+2 for safety).  Pt with poor balance upon standing and transferring.  Continue to recommend SNF.  Follow Up Recommendations  SNF     Equipment Recommendations  None recommended by PT    Recommendations for Other Services       Precautions / Restrictions Precautions Precautions: Fall    Mobility  Bed Mobility Overal bed mobility: Needs Assistance Bed Mobility: Supine to Sit     Supine to sit: Mod assist;+2 for physical assistance     General bed mobility comments: assist for LEs and scooting to EOB, utilized bed pad  Transfers Overall transfer level: Needs assistance Equipment used: Rolling walker (2 wheeled) Transfers: Sit to/from [facility] Sit to Stand: Mod assist Stand pivot transfers: Mod assist       General transfer comment: assist to rise and steady, posterior lean present, pt able to correct with cues and time; pt felt unable to ambulate so transferred to recliner  Ambulation/Gait                 Stairs             Wheelchair Mobility    Modified Rankin (Stroke Patients Only)       Balance                                            Cognition Arousal/Alertness: Awake/alert Behavior During Therapy: WFL for tasks assessed/performed Overall Cognitive Status: History of cognitive impairments - at baseline                                        Exercises      General Comments        Pertinent Vitals/Pain Pain Assessment: No/denies pain    Home Living                      Prior Function            PT Goals (current goals  can now be found in the care plan section) Progress towards PT goals: Progressing toward goals    Frequency    Min 3X/week      PT Plan Current plan remains appropriate    Co-evaluation              AM-PAC PT "6 Clicks" Mobility   Outcome Measure  Help needed turning from your back to your side while in a flat bed without using bedrails?: A Little Help needed moving from lying on your back to sitting on the side of a flat bed without using bedrails?: A Lot Help needed moving to and from a bed to a chair (including a wheelchair)?: A Lot Help needed standing up from a chair using your arms (e.g., wheelchair or bedside chair)?: A Lot Help needed to walk in hospital room?: A Lot Help needed climbing 3-5 steps with a railing? : Total 6 Click Score: 12  End of Session Equipment Utilized During Treatment: Gait belt Activity Tolerance: Patient limited by fatigue Patient left: in chair;with call bell/phone within reach;with chair alarm set   PT Visit Diagnosis: Difficulty in walking, not elsewhere classified (R26.2)     Time: 6286-3817 PT Time Calculation (min) (ACUTE ONLY): 12 min  Charges:  $Therapeutic Activity: 8-22 mins                     [name], PT, DPT Acute Rehabilitation Services Office: [phone] Pager: [phone]  [name] 02/13/2019, 3:22 PM

## 2019-02-13 NOTE — TOC Initial Note (Signed)
Transition of Care [facility]) - Initial/Assessment Note    Patient Details  Name: John Peck MRN: [id number] Date of Birth: 1924-10-30  Transition of Care [facility]) CM/SW Contact:    Dessa Phi, RN Phone Number: 02/13/2019, 1:41 PM  Clinical Narrative: From [facility] will plan to return. DNR.Noted may need palliative cons.GI following.                  Expected Discharge Plan: Skilled Nursing Facility Barriers to Discharge: No Barriers Identified   Patient Goals and CMS Choice Patient states their goals for this hospitalization and ongoing recovery are:: (go back to facility) CMS Medicare.gov Compare Post Acute Care list provided to:: Patient Represenative (must comment)    Expected Discharge Plan and Services Expected Discharge Plan: [place]   Discharge Planning Services: CM Consult   Living arrangements for the past 2 months: [place] Expected Discharge Date: (unknown)                        Prior Living Arrangements/Services Living arrangements for the past 2 months: [place]   Patient language and need for interpreter reviewed:: Yes Do you feel safe going back to the place where you live?: Yes      Need for Family Participation in Patient Care: No (Comment) Care giver support system in place?: Yes (comment)   Criminal Activity/Legal Involvement Pertinent to Current Situation/Hospitalization: No - Comment as needed  Activities of Daily Living Home Assistive Devices/Equipment: Blood pressure cuff, Oxygen, Nebulizer, Eyeglasses, Wheelchair, Hospital bed, Grab bars in shower, Hand-held shower hose, Grab bars around toilet, Hoyer Lift([facility] has necessary equipment for their residents) ADL Screening (condition at time of admission) Patient's cognitive ability adequate to safely complete daily activities?: Yes Is the patient deaf or have difficulty hearing?: Yes(very hoh) Does the patient have difficulty seeing,  even when wearing glasses/contacts?: No Does the patient have difficulty concentrating, remembering, or making decisions?: No Patient able to express need for assistance with ADLs?: Yes Does the patient have difficulty dressing or bathing?: Yes Independently performs ADLs?: No Communication: Independent Dressing (OT): Dependent Is this a change from baseline?: Pre-admission baseline Grooming: Dependent Is this a change from baseline?: Pre-admission baseline Feeding: Dependent Is this a change from baseline?: Pre-admission baseline Bathing: Dependent Is this a change from baseline?: Pre-admission baseline Toileting: Dependent Is this a change from baseline?: Pre-admission baseline In/Out Bed: Dependent Is this a change from baseline?: Pre-admission baseline Walks in Home: Dependent Is this a change from baseline?: Pre-admission baseline Does the patient have difficulty walking or climbing stairs?: Yes Weakness of Legs: Both Weakness of Arms/Hands: Both  Permission Sought/Granted Permission sought to share information with : Case Manager Permission granted to share information with : Yes, Verbal Permission Granted  Share Information with NAME: Dorian Pod Carriker)  Permission granted to share info w AGENCY: [facility])  Permission granted to share info w Relationship: (Daughter)  Permission granted to share info w Contact Information: [phone])  Emotional Assessment Appearance:: Appears stated age, Well-Groomed Attitude/Demeanor/Rapport: Gracious Affect (typically observed): Accepting Orientation: : Oriented to Self, Oriented to Place Alcohol / Substance Use: Tobacco Use Psych Involvement: No (comment)  Admission diagnosis:  Acute blood loss anemia [D62] Rectal bleeding [K62.5] Patient Active Problem List   Diagnosis Date Noted  . GI bleed 02/10/2019  . Adult failure to thrive   . Palliative care by specialist   . DNR (do not resuscitate)   . Dysarthria 11/21/2018  .  Acute  on chronic systolic CHF (congestive heart failure) ([place]) 11/21/2018  . SOB (shortness of breath)   . Acidosis 11/17/2018  . Renal failure (ARF), acute on chronic (HCC) 11/15/2018  . Constipation 11/15/2018  . Pyuria 11/15/2018  . Dementia ([place]) 11/15/2018  . Dysphagia   . AKI (acute kidney injury) ([place])   . Altered mental status 08/09/2018  . Delirium 08/09/2018  . UTI (urinary tract infection) 08/09/2018  . Right flank pain   . Goals of care, counseling/discussion   . Palliative care encounter   . Chest pain 07/29/2018  . TIA (transient ischemic attack) 06/29/2018  . Pressure injury of skin 05/25/2018  . Pleural effusion 05/24/2018  . Acute respiratory failure with hypoxia ([place]) 05/24/2018  . NSTEMI (non-ST elevated myocardial infarction) ([place])   . Chronic systolic CHF (congestive heart failure) ([place]) 03/14/2018  . COPD clinical dx/ no pfts on record 03/14/2018  . H/O: GI bleed 03/14/2018  . CKD (chronic kidney disease), stage III ([place]) 03/14/2018  . Macrocytic anemia 03/14/2018  . Paroxysmal atrial fibrillation ([place]) 03/14/2018  . Hypothyroidism 03/14/2018  . Carotid stenosis, right 03/14/2018  . Facial droop 02/22/2018  . Pancytopenia ([place]) 02/22/2018  . Generalized weakness   . Hypertensive heart disease   . Hyperlipidemia   . Stented coronary artery   . Coronary artery disease involving native coronary artery of native heart with unstable angina pectoris ([place])   . Reactive airway disease 11/27/2015  . CAD in native artery 09/03/2015  . Weakness 05/09/2015  . Hyperlipidemia LDL goal <70 02/28/2015  . Atherosclerosis of lower extremity with claudication ([place]) 07/17/2014  . PAF (paroxysmal atrial fibrillation) ([place]) 03/24/2013  . Lower extremity edema 03/24/2013  . Sinoatrial node dysfunction ([place]) 08/17/2012  . History of iron deficiency anemia 09/18/2011  . Essential hypertension 07/07/2009  . INTERMEDIATE CORONARY SYNDROME 07/07/2009  . S/P CABG x 2 '89. RCA  stent'04. last cath Jan 2013 07/07/2009  . PACEMAKER, PERMANENT- St Jude Sept 2009 07/07/2009  . Sick sinus syndrome ([place]) 07/10/2008  . Pacemaker 07/10/2008   PCP:  Seward Carol, MD Pharmacy:  No Pharmacies Listed    Social Determinants of Health (SDOH) Interventions    Readmission Risk Interventions No flowsheet data found.

## 2019-02-13 NOTE — Progress Notes (Signed)
[facility] PROGRESS NOTE Subjective Patient apparently had some clots last night no bleeding today hemoglobin down some receiving a unit of blood.  Review of the records shows that he has been seen multiple times in the past by palliative care and is been recommended supportive measures only.  I reviewed his records from our office and he has had several colonoscopies 2007, 2009 and 2012 by Dr. Amedeo Plenty and Dr. Michail Sermon.  He has had multiple diverticuli no polyps etc.  Due to his continued episodes of GI bleeding he was taken off of anticoagulation and is currently off of his antiplatelet agents.  Objective: Vital signs in last 24 hours: Temp:  [98.1 F (36.7 C)-99.5 F (37.5 C)] 98.9 F (37.2 C) (04/06 0930) Pulse Rate:  [59-75] 75 (04/06 0930) Resp:  [16-24] 18 (04/06 0930) BP: (126-170)/(49-74) 130/58 (04/06 0930) SpO2:  [96 %-100 %] 98 % (04/06 0930) Last BM Date: 02/11/19  Intake/Output from previous day: 04/05 0701 - 04/06 0700 In: 420 [P.O.:420] Out: 1975 [Urine:1975] Intake/Output this shift: Total I/O In: 368.8 [Blood:368.8] Out: -   PE: General--minimally responsive no distress difficult to wake up  Abdomen--nondistended soft and nontender  Lab Results: Recent Labs    02/11/19 0528 02/11/19 0944 02/11/19 1647 02/12/19 0312 02/12/19 1656 02/13/19 0505  WBC 5.2  --  5.8 5.2 7.0 5.2  HGB 7.3* 7.6* 7.5* 7.1* 7.3* 6.2*  HCT 23.5* 24.0* 23.8* 22.2* 23.4* 19.3*  PLT 155  --  153 149* 142* 136*   BMET Recent Labs    02/10/19 1200 02/11/19 0528  NA 140 139  K 4.5 4.5  CL 112* 111  CO2 22 20*  CREATININE 1.85* 1.59*   LFT Recent Labs    02/10/19 1200 02/11/19 0528  PROT 6.3* 6.2*  AST 14* 12*  ALT 14 13  ALKPHOS 46 41  BILITOT 0.4 0.8   PT/INR Recent Labs    02/10/19 1200  LABPROT 17.5*  INR 1.5*   PANCREAS No results for input(s): LIPASE in the last 72 hours.       Studies/Results: No results found.  Medications: I have  reviewed the patient's current medications.  Assessment:   1.  GI bleeding.  Difficult position in this elderly man with multiple problems including dementia A. fib etc.  He has been seen in the past by palliative care we may need to do that again.  He is not on any stool softening agents and I think putting him on MiraLAX to see if this would allow his colon to stop bleeding would be reasonable.   Plan: I will place him on MiraLAX twice daily and hopefully this will soften the stool and allow him to stop bleeding.  Would question whether we should even consider resuming antiplatelet agents.   Nancy Fetter 02/13/2019, 10:29 AM  This note was created using voice recognition software. Minor errors may Have occurred unintentionally.  Pager: [phone] If no answer or after hours call [phone]

## 2019-02-14 ENCOUNTER — Other Ambulatory Visit: Payer: Self-pay

## 2019-02-14 DIAGNOSIS — I70219 Atherosclerosis of native arteries of extremities with intermittent claudication, unspecified extremity: Secondary | ICD-10-CM

## 2019-02-14 LAB — TYPE AND SCREEN
ABO/RH(D): O POS
Antibody Screen: NEGATIVE
Unit division: 0
Unit division: 0

## 2019-02-14 LAB — BPAM RBC
Blood Product Expiration Date: 202004192359
Blood Product Expiration Date: 202004202359
ISSUE DATE / TIME: 202004031421
ISSUE DATE / TIME: 202004060629
Unit Type and Rh: 5100
Unit Type and Rh: 5100

## 2019-02-14 LAB — CBC
HCT: 23.1 % — ABNORMAL LOW (ref 39.0–52.0)
HCT: 23.6 % — ABNORMAL LOW (ref 39.0–52.0)
Hemoglobin: 7.1 g/dL — ABNORMAL LOW (ref 13.0–17.0)
Hemoglobin: 7.5 g/dL — ABNORMAL LOW (ref 13.0–17.0)
MCH: 30.5 pg (ref 26.0–34.0)
MCH: 31.6 pg (ref 26.0–34.0)
MCHC: 30.7 g/dL (ref 30.0–36.0)
MCHC: 31.8 g/dL (ref 30.0–36.0)
MCV: 99.1 fL (ref 80.0–100.0)
MCV: 99.6 fL (ref 80.0–100.0)
Platelets: 132 10*3/uL — ABNORMAL LOW (ref 150–400)
Platelets: 132 10*3/uL — ABNORMAL LOW (ref 150–400)
RBC: 2.33 MIL/uL — ABNORMAL LOW (ref 4.22–5.81)
RBC: 2.37 MIL/uL — ABNORMAL LOW (ref 4.22–5.81)
RDW: 17.3 % — ABNORMAL HIGH (ref 11.5–15.5)
RDW: 17.3 % — ABNORMAL HIGH (ref 11.5–15.5)
WBC: 4.8 10*3/uL (ref 4.0–10.5)
WBC: 4.8 10*3/uL (ref 4.0–10.5)
nRBC: 0 % (ref 0.0–0.2)
nRBC: 0 % (ref 0.0–0.2)

## 2019-02-14 MED ORDER — IPRATROPIUM-ALBUTEROL 0.5-2.5 (3) MG/3ML IN SOLN
3.0000 mL | Freq: Four times a day (QID) | RESPIRATORY_TRACT | Status: DC | PRN
  Administered 2019-02-15: 3 mL via RESPIRATORY_TRACT
  Filled 2019-02-14: qty 3

## 2019-02-14 MED ORDER — GUAIFENESIN-DM 100-10 MG/5ML PO SYRP
5.0000 mL | ORAL_SOLUTION | ORAL | Status: DC | PRN
  Administered 2019-02-14 – 2019-02-15 (×3): 5 mL via ORAL
  Filled 2019-02-14 (×3): qty 10

## 2019-02-14 NOTE — Progress Notes (Signed)
[facility] PROGRESS NOTE Subjective Patient apparently had some bright red blood yesterday.  No further stools.  He is on MiraLAX twice daily. Objective: Vital signs in last 24 hours: Temp:  [98.6 F (37 C)-99.1 F (37.3 C)] 99.1 F (37.3 C) (04/07 0355) Pulse Rate:  [59-62] 60 (04/07 0355) Resp:  [18-19] 18 (04/07 0355) BP: (138-161)/(65-75) 154/73 (04/07 0355) SpO2:  [94 %-99 %] 97 % (04/07 0742) Weight:  [58.2 kg] 58.2 kg (04/07 0355) Last BM Date: 02/14/19  Intake/Output from previous day: 04/06 0701 - 04/07 0700 In: 1128.8 [P.O.:760; Blood:368.8] Out: 700 [Urine:700] Intake/Output this shift: No intake/output data recorded.  PE: General--pleasantly confused but more responsive  Abdomen--nontender nondistended good bowel sounds  Lab Results: Recent Labs    02/12/19 0312 02/12/19 1656 02/13/19 0505 02/13/19 1645 02/14/19 0509  WBC 5.2 7.0 5.2 6.1 4.8  HGB 7.1* 7.3* 6.2* 7.6* 7.1*  HCT 22.2* 23.4* 19.3* 23.9* 23.1*  PLT 149* 142* 136* 138* 132*   BMET No results for input(s): NA, K, CL, CO2, CREATININE in the last 72 hours. LFT No results for input(s): PROT, AST, ALT, ALKPHOS, BILITOT, BILIDIR, IBILI in the last 72 hours. PT/INR No results for input(s): LABPROT, INR in the last 72 hours. PANCREAS No results for input(s): LIPASE in the last 72 hours.       Studies/Results: No results found.  Medications: I have reviewed the patient's current medications.  Assessment:   1.  Lower GI bleeding.  Probably due to diverticular disease.  He is on MiraLAX twice daily.  Hopefully is getting this down.  Seems to go for long periods of time without bowel movements or bleeding.  He apparently has had 2 units of blood on this admission.   Plan: At this point would recommend supportive therapy.  Likely bleeding from diverticulosis or hemorrhoids.  Would keep him on MiraLAX and keep his stool soft and hopefully this will resolve.  We will follow every  couple days.   Nancy Fetter 02/14/2019, 11:05 AM  This note was created using voice recognition software. Minor errors may Have occurred unintentionally.  Pager: [phone] If no answer or after hours call [phone]

## 2019-02-14 NOTE — Progress Notes (Signed)
Physical Therapy Treatment Patient Details Name: John Peck MRN: [id number] DOB: October 09, 1924 Today's Date: 02/14/2019    History of Present Illness Pt admitted with GIB and with hx of CVA, COPD, CKD, a-fib, dementia, and pacemaker    PT Comments    Pt presenting with left lateral and posterior lean with standing today.  Pt fatigued quickly and required increased assist so returned to supine.   Follow Up Recommendations  SNF     Equipment Recommendations  None recommended by PT    Recommendations for Other Services       Precautions / Restrictions Precautions Precautions: Fall    Mobility  Bed Mobility Overal bed mobility: Needs Assistance Bed Mobility: Supine to Sit;Sit to Supine     Supine to sit: Mod assist;+2 for physical assistance;HOB elevated Sit to supine: Mod assist   General bed mobility comments: assist for LEs, pt utilized bed rail to self assist, HOB elevated  Transfers Overall transfer level: Needs assistance Equipment used: Rolling walker (2 wheeled) Transfers: Sit to/from Stand Sit to Stand: Max assist         General transfer comment: assist to rise and steady, posterior and left lateral lean present; pt unable to correct with cues and required assist; pt returned to sitting. pt felt unable to attempt again after rest break and requested assist back to bed  Ambulation/Gait                 Stairs             Wheelchair Mobility    Modified Rankin (Stroke Patients Only)       Balance                                            Cognition Arousal/Alertness: Awake/alert Behavior During Therapy: WFL for tasks assessed/performed Overall Cognitive Status: History of cognitive impairments - at baseline                                        Exercises      General Comments        Pertinent Vitals/Pain Pain Assessment: No/denies pain    Home Living                       Prior Function            PT Goals (current goals can now be found in the care plan section) Progress towards PT goals: Progressing toward goals    Frequency    Min 3X/week      PT Plan Current plan remains appropriate    Co-evaluation              AM-PAC PT "6 Clicks" Mobility   Outcome Measure  Help needed turning from your back to your side while in a flat bed without using bedrails?: A Lot Help needed moving from lying on your back to sitting on the side of a flat bed without using bedrails?: A Lot Help needed moving to and from a bed to a chair (including a wheelchair)?: A Lot Help needed standing up from a chair using your arms (e.g., wheelchair or bedside chair)?: A Lot Help needed to walk in hospital room?: Total Help needed climbing 3-5 steps with a  railing? : Total 6 Click Score: 10    End of Session Equipment Utilized During Treatment: Gait belt Activity Tolerance: Patient limited by fatigue Patient left: with call bell/phone within reach;in bed;with bed alarm set Nurse Communication: Mobility status PT Visit Diagnosis: Muscle weakness (generalized) (M62.81)     Time: 1025-4862 PT Time Calculation (min) (ACUTE ONLY): 17 min  Charges:  $Therapeutic Activity: 8-22 mins                     [name], PT, DPT Acute Rehabilitation Services Office: [phone] Pager: [phone]  [name] 02/14/2019, 1:29 PM

## 2019-02-14 NOTE — Progress Notes (Signed)
PROGRESS NOTE    John Peck  John Peck:[id number] DOB: [birth year] DOA: 02/10/2019 PCP: Seward Carol, MD   Brief Narrative: John Peck is a 85 y.o. male with a history of hypertension, sick sinus syndrome status post pacemaker, hyperlipidemia, chronic diastolic heart failure, CVA, COPD, CKD stage III, paroxysmal atrial fibrillation, hypothyroidism, dementia.  Patient presented secondary to noticing bright red blood per rectum.  Concern for GI bleed.  GI consulted.  He has received 2 unit of PRBC to date.   Assessment & Plan:   Principal Problem:   GI bleed Active Problems:   Essential hypertension   Sick sinus syndrome (HCC)   Hyperlipidemia   Chronic systolic CHF (congestive heart failure) (HCC)   COPD clinical dx/ no pfts on record   CKD (chronic kidney disease), stage III (HCC)   Paroxysmal atrial fibrillation (HCC)   Hypothyroidism   Dementia (HCC)   Bright red blood per rectum Concern for lower GI bleed.  Patient again with hematochezia overnight. Hemoglobin dropped again on 4/6 requiring repeat transfusion. 2 units of PRBC to date. No bleeding overnight per nurse report however did not have any stool. Hemoglobin trending down again -CBC q12 hours -GI recommendations: stool softener to help reduce recurrent bleeding  COPD Shortness of breath resolved. On room air.  Low concern for COVID-19. Wheezing resolved.  -Switch to Albuterol q6 hours prn  Acute blood loss anemia on chronic anemia Normocytic. In setting of CKD and acute blood loss from hematochezia.  Baseline hemoglobin around 8.  S/p 1 unit of PRBC for a hemoglobin of 6.4. Up to 7.3 on 4/3 and another unit for a hemoglobin of 6.2 with improvement to 7.6 on 4/6. Continues to have continued bloody stool. Hemoglobin down significantly today. -CBC BID  Hypothyroidism -Continue Synthroid  Paroxysmal atrial fibrillation Currently rate controlled.  Regular rhythm.  Not on anticoagulation but is on aspirin and  Plavix as an outpatient. -Continue Coreg -GI recommendations for antiplatelet therapy resumption (leaning towards not resuming meds)  Sick sinus syndrome Patient is status post pacemaker.  Essential hypertension Patient is on Imdur, Coreg as an outpatient. -Continue Coreg -Hold Imdur in setting of active GI bleed and possible hypotension -Hydralazine IV prn  Hyperlipidemia -Continue Lipitor  Chronic diastolic heart failure Currently stable.  Not on diuretic therapy as an outpatient.  Currently on Coreg. -Continue Coreg -Watch daily weights in addition to strict in and out  Poor hearing Noted. Difficult to communicate with patient at times.  History of CVA Currently on aspirin and Plavix as an outpatient. Held secondary to active GI bleeding. Will need to be addressed prior to discharge as mentioned above   DVT prophylaxis: SCDs Code Status:   Code Status: DNR Family Communication: None at bedside Disposition Plan: Discharge pending GI workup for hematochezia   Consultants:   [facility]  Procedures:   None  Antimicrobials:  None    Subjective: No issues today per patient. Breathing better.  Objective: Vitals:   02/13/19 2017 02/13/19 2100 02/14/19 0355 02/14/19 0742  BP:  138/65 (!) 154/73   Pulse:  (!) 59 60   Resp:  18 18   Temp:  99 F (37.2 C) 99.1 F (37.3 C)   TempSrc:  Oral Oral   SpO2: 94% 99% 98% 97%  Weight:   58.2 kg   Height:        Intake/Output Summary (Last 24 hours) at 02/14/2019 1011 Last data filed at 02/13/2019 1313 Gross per 24 hour  Intake 380  ml  Output 700 ml  Net -320 ml   Filed Weights   02/11/19 0404 02/12/19 0500 02/14/19 0355  Weight: 63.3 kg 59.8 kg 58.2 kg    Examination:  General exam: Appears calm and comfortable Respiratory system: Clear to auscultation. Respiratory effort normal. Cardiovascular system: S1 & S2 heard, RRR. No murmurs, rubs, gallops or clicks. Gastrointestinal system: Abdomen is nondistended,  soft and nontender. No organomegaly or masses felt. Normal bowel sounds heard. Central nervous system: Alert. No focal neurological deficits. Extremities: No edema. No calf tenderness Skin: No cyanosis. No rashes Psychiatry: Judgement and insight appear normal. Mood & affect appropriate.     Data Reviewed: I have personally reviewed following labs and imaging studies  CBC: Recent Labs  Lab 02/10/19 1200  02/12/19 0312 02/12/19 1656 02/13/19 0505 02/13/19 1645 02/14/19 0509  WBC 4.9   < > 5.2 7.0 5.2 6.1 4.8  NEUTROABS 3.5  --   --   --   --   --   --   HGB 6.4*   < > 7.1* 7.3* 6.2* 7.6* 7.1*  HCT 20.9*   < > 22.2* 23.4* 19.3* 23.9* 23.1*  MCV 105.0*   < > 98.7 100.0 98.5 99.2 99.1  PLT 165   < > 149* 142* 136* 138* 132*   < > = values in this interval not displayed.   Basic Metabolic Panel: Recent Labs  Lab 02/10/19 1200 02/11/19 0528  NA 140 139  K 4.5 4.5  CL 112* 111  CO2 22 20*  GLUCOSE 124* 75  BUN 40* 37*  CREATININE 1.85* 1.59*  CALCIUM 7.9* 8.0*   GFR: Estimated Creatinine Clearance: 24.4 mL/min (A) (by C-G formula based on SCr of 1.59 mg/dL (H)). Liver Function Tests: Recent Labs  Lab 02/10/19 1200 02/11/19 0528  AST 14* 12*  ALT 14 13  ALKPHOS 46 41  BILITOT 0.4 0.8  PROT 6.3* 6.2*  ALBUMIN 2.3* 2.3*   No results for input(s): LIPASE, AMYLASE in the last 168 hours. No results for input(s): AMMONIA in the last 168 hours. Coagulation Profile: Recent Labs  Lab 02/10/19 1200  INR 1.5*   Cardiac Enzymes: No results for input(s): CKTOTAL, CKMB, CKMBINDEX, TROPONINI in the last 168 hours. BNP (last 3 results) No results for input(s): PROBNP in the last 8760 hours. HbA1C: No results for input(s): HGBA1C in the last 72 hours. CBG: No results for input(s): GLUCAP in the last 168 hours. Lipid Profile: No results for input(s): CHOL, HDL, LDLCALC, TRIG, CHOLHDL, LDLDIRECT in the last 72 hours. Thyroid Function Tests: No results for input(s): TSH,  T4TOTAL, FREET4, T3FREE, THYROIDAB in the last 72 hours. Anemia Panel: No results for input(s): VITAMINB12, FOLATE, FERRITIN, TIBC, IRON, RETICCTPCT in the last 72 hours. Sepsis Labs: No results for input(s): PROCALCITON, LATICACIDVEN in the last 168 hours.  No results found for this or any previous visit (from the past 240 hour(s)).       Radiology Studies: No results found.      Scheduled Meds: . sodium chloride   Intravenous Once  . atorvastatin  20 mg Oral q1800  . carvedilol  3.125 mg Oral BID WC  . ipratropium-albuterol  3 mL Nebulization BID  . levothyroxine  125 mcg Oral Q0600  . pantoprazole  40 mg Oral Daily  . polyethylene glycol  17 g Oral BID  . tamsulosin  0.4 mg Oral Daily  . traZODone  150 mg Oral QHS  . vitamin C  500 mg Oral Daily  Continuous Infusions:   LOS: 3 days     Cordelia Poche, MD [facility] 02/14/2019, 10:11 AM  If 7PM-7AM, please contact night-coverage [email or web address]

## 2019-02-14 NOTE — Care Management Important Message (Signed)
Important Message  Patient Details  Name: JERROL HELMERS MRN: [id number] Date of Birth: October 10, 1924   Medicare Important Message Given:  Yes    Kerin Salen 02/14/2019, 12:05 [place] Message  Patient Details  Name: CLIDE REMMERS MRN: [id number] Date of Birth: 11/05/1924   Medicare Important Message Given:  Yes    Kerin Salen 02/14/2019, 12:05 PM

## 2019-02-15 DIAGNOSIS — Z79890 Hormone replacement therapy (postmenopausal): Secondary | ICD-10-CM

## 2019-02-15 DIAGNOSIS — Z9889 Other specified postprocedural states: Secondary | ICD-10-CM

## 2019-02-15 DIAGNOSIS — K649 Unspecified hemorrhoids: Secondary | ICD-10-CM

## 2019-02-15 DIAGNOSIS — Z66 Do not resuscitate: Secondary | ICD-10-CM

## 2019-02-15 DIAGNOSIS — I5032 Chronic diastolic (congestive) heart failure: Secondary | ICD-10-CM

## 2019-02-15 DIAGNOSIS — Z7982 Long term (current) use of aspirin: Secondary | ICD-10-CM

## 2019-02-15 DIAGNOSIS — D62 Acute posthemorrhagic anemia: Secondary | ICD-10-CM

## 2019-02-15 DIAGNOSIS — I130 Hypertensive heart and chronic kidney disease with heart failure and stage 1 through stage 4 chronic kidney disease, or unspecified chronic kidney disease: Secondary | ICD-10-CM

## 2019-02-15 DIAGNOSIS — Z79899 Other long term (current) drug therapy: Secondary | ICD-10-CM

## 2019-02-15 DIAGNOSIS — Z950 Presence of cardiac pacemaker: Secondary | ICD-10-CM

## 2019-02-15 DIAGNOSIS — Z8673 Personal history of transient ischemic attack (TIA), and cerebral infarction without residual deficits: Secondary | ICD-10-CM

## 2019-02-15 DIAGNOSIS — H9190 Unspecified hearing loss, unspecified ear: Secondary | ICD-10-CM

## 2019-02-15 DIAGNOSIS — Z7902 Long term (current) use of antithrombotics/antiplatelets: Secondary | ICD-10-CM

## 2019-02-15 DIAGNOSIS — K625 Hemorrhage of anus and rectum: Secondary | ICD-10-CM

## 2019-02-15 LAB — CBC
HCT: 23.4 % — ABNORMAL LOW (ref 39.0–52.0)
HCT: 25.6 % — ABNORMAL LOW (ref 39.0–52.0)
Hemoglobin: 7.5 g/dL — ABNORMAL LOW (ref 13.0–17.0)
Hemoglobin: 8.1 g/dL — ABNORMAL LOW (ref 13.0–17.0)
MCH: 31.6 pg (ref 26.0–34.0)
MCH: 31.6 pg (ref 26.0–34.0)
MCHC: 32.1 g/dL (ref 30.0–36.0)
MCHC: 31.6 g/dL (ref 30.0–36.0)
MCV: 98.7 fL (ref 80.0–100.0)
MCV: 100 fL (ref 80.0–100.0)
Platelets: 124 10*3/uL — ABNORMAL LOW (ref 150–400)
Platelets: 141 10*3/uL — ABNORMAL LOW (ref 150–400)
RBC: 2.37 MIL/uL — ABNORMAL LOW (ref 4.22–5.81)
RBC: 2.56 MIL/uL — ABNORMAL LOW (ref 4.22–5.81)
RDW: 17 % — ABNORMAL HIGH (ref 11.5–15.5)
RDW: 17 % — ABNORMAL HIGH (ref 11.5–15.5)
WBC: 5.3 10*3/uL (ref 4.0–10.5)
WBC: 5.9 10*3/uL (ref 4.0–10.5)
nRBC: 0 % (ref 0.0–0.2)
nRBC: 0 % (ref 0.0–0.2)

## 2019-02-15 LAB — MRSA PCR SCREENING: MRSA by PCR: NEGATIVE

## 2019-02-15 MED ORDER — IPRATROPIUM-ALBUTEROL 0.5-2.5 (3) MG/3ML IN SOLN: 3.0000 mL | Freq: Two times a day (BID) | RESPIRATORY_TRACT | Status: DC

## 2019-02-15 MED ORDER — IPRATROPIUM-ALBUTEROL 0.5-2.5 (3) MG/3ML IN SOLN
3.0000 mL | Freq: Two times a day (BID) | RESPIRATORY_TRACT | Status: DC
  Administered 2019-02-16 – 2019-02-17 (×3): 3 mL via RESPIRATORY_TRACT
  Filled 2019-02-15 (×3): qty 3

## 2019-02-15 NOTE — Progress Notes (Addendum)
PROGRESS NOTE    John Peck  RXV:[id number] DOB: 03-Jan-1924 DOA: 02/10/2019 PCP: Seward Carol, MD   Brief Narrative:   85 y.o. male with a history of hypertension, sick sinus syndrome status post pacemaker, hyperlipidemia, chronic diastolic heart failure, CVA, COPD, CKD stage III, paroxysmal atrial fibrillation, hypothyroidism, dementia.  Patient presented secondary to noticing bright red blood per rectum.  Concern for GI bleed.  GI consulted.  He has received 2 unit of PRBC to date.  Feeds self goes to therapy at [facility] from [place] with assist--has been at this level of activity 7-8 months and has declined since last April when had a stroke   Assessment & Plan:   Principal Problem:   GI bleed Active Problems:   Essential hypertension   Sick sinus syndrome (HCC)   Hyperlipidemia   Chronic systolic CHF (congestive heart failure) (HCC)   COPD clinical dx/ no pfts on record   CKD (chronic kidney disease), stage III (HCC)   Paroxysmal atrial fibrillation (HCC)   Hypothyroidism   Dementia (HCC)   Bright red blood per rectum, ddx per GI diverticulosis vs hematochezia--hemorrhoidal Concern for lower GI bleed.  Patient again with hematochezia overnight. Hemoglobin dropped again on 4/6 requiring repeat transfusion. 2 units of PRBC to date.  -CBC q12 hours -GI recommendations: stool softener to help reduce recurrent bleeding -As hemopdyn stable, even with dark stools, not a great candidate for any procedures -check labs in am--if stable, would confer with GI, but probably d/c to SNF -would not scope him unless emergent bleeding   COPD Shortness of breath resolved. On room air.  Low concern for COVID-19. Wheezing resolved.  -Switch to Albuterol q6 hours prn  Acute blood loss anemia on chronic anemia Normocytic. In setting of CKD and acute blood loss from hematochezia.  Baseline hemoglobin around 8.  Needed a unit 02/10/19, 02/13/19 -CBC BID -stable at 7.5 now   Hypothyroidism -Continue Synthroid  Paroxysmal atrial fibrillation Currently rate controlled.  Regular rhythm.  Not on anticoagulation but is on aspirin and Plavix as an outpatient. -Continue Coreg -GI recommendations pending for antiplatelet therapy resumption (leaning towards not resuming meds)  Sick sinus syndrome Patient is status post pacemaker.  Essential hypertension Patient is on Imdur, Coreg as an outpatient. -Continue Coreg -Hold Imdur in setting of active GI bleed and possible hypotension -Hydralazine IV prn  Hyperlipidemia -Continue Lipitor  Chronic diastolic heart failure Currently stable.  Not on diuretic therapy as an outpatient.  Currently on Coreg. -Continue Coreg -currently -296 cc  Poor hearing Noted. Difficult to communicate with patient at times.  History of CVA Currently on aspirin and Plavix as an outpatient. Held secondary to active GI bleeding. Will need to be addressed prior to discharge as mentioned above   DVT prophylaxis: SCDs Code Status:   Code Status: DNR Family Communication: None at bedside Disposition Plan: Discharge pending GI workup for hematochezia   Consultants:   [facility]  Procedures:   None  Antimicrobials:  None    Subjective:  Awake Asking "whats going on with me:" No fever no chills no n/v Nursing reports 1 large unformed stool-non-bloody, no dakr blood  Objective: Vitals:   02/14/19 1315 02/14/19 1900 02/14/19 2028 02/15/19 0511  BP: 136/65  139/74 (!) 157/64  Pulse: 63  64 60  Resp: 19  19   Temp: 98.9 F (37.2 C)  99.6 F (37.6 C) 98.6 F (37 C)  TempSrc: Oral  Oral Oral  SpO2: 99% 98% 100%  99%  Weight:      Height:        Intake/Output Summary (Last 24 hours) at 02/15/2019 0818 Last data filed at 02/15/2019 0600 Gross per 24 hour  Intake 750 ml  Output 700 ml  Net 50 ml   Filed Weights   02/11/19 0404 02/12/19 0500 02/14/19 0355  Weight: 63.3 kg 59.8 kg 58.2 kg    Examination:  eomi  ncat no ict no pallor, sebaceous cyst of L jaw Throat soft supple No thyromeg abd soft nt nd no rebound no guard  Rectal-hemorrhoid at 1800, no  bleeding Neuro intact otherwise Skin soft supple, NO LE edmea    Data Reviewed: I have personally reviewed following labs and imaging studies  CBC: Recent Labs  Lab 02/10/19 1200  02/13/19 0505 02/13/19 1645 02/14/19 0509 02/14/19 1650 02/15/19 0515  WBC 4.9   < > 5.2 6.1 4.8 4.8 5.3  NEUTROABS 3.5  --   --   --   --   --   --   HGB 6.4*   < > 6.2* 7.6* 7.1* 7.5* 7.5*  HCT 20.9*   < > 19.3* 23.9* 23.1* 23.6* 23.4*  MCV 105.0*   < > 98.5 99.2 99.1 99.6 98.7  PLT 165   < > 136* 138* 132* 132* 124*   < > = values in this interval not displayed.   Basic Metabolic Panel: Recent Labs  Lab 02/10/19 1200 02/11/19 0528  NA 140 139  K 4.5 4.5  CL 112* 111  CO2 22 20*  GLUCOSE 124* 75  BUN 40* 37*  CREATININE 1.85* 1.59*  CALCIUM 7.9* 8.0*   GFR: Estimated Creatinine Clearance: 24.4 mL/min (A) (by C-G formula based on SCr of 1.59 mg/dL (H)). Liver Function Tests: Recent Labs  Lab 02/10/19 1200 02/11/19 0528  AST 14* 12*  ALT 14 13  ALKPHOS 46 41  BILITOT 0.4 0.8  PROT 6.3* 6.2*  ALBUMIN 2.3* 2.3*   No results for input(s): LIPASE, AMYLASE in the last 168 hours. No results for input(s): AMMONIA in the last 168 hours. Coagulation Profile: Recent Labs  Lab 02/10/19 1200  INR 1.5*   Cardiac Enzymes: No results for input(s): CKTOTAL, CKMB, CKMBINDEX, TROPONINI in the last 168 hours. BNP (last 3 results) No results for input(s): PROBNP in the last 8760 hours. HbA1C: No results for input(s): HGBA1C in the last 72 hours. CBG: No results for input(s): GLUCAP in the last 168 hours. Lipid Profile: No results for input(s): CHOL, HDL, LDLCALC, TRIG, CHOLHDL, LDLDIRECT in the last 72 hours. Thyroid Function Tests: No results for input(s): TSH, T4TOTAL, FREET4, T3FREE, THYROIDAB in the last 72 hours. Anemia Panel: No  results for input(s): VITAMINB12, FOLATE, FERRITIN, TIBC, IRON, RETICCTPCT in the last 72 hours. Sepsis Labs: No results for input(s): PROCALCITON, LATICACIDVEN in the last 168 hours.  No results found for this or any previous visit (from the past 240 hour(s)).   Radiology Studies: No results found.   Scheduled Meds: . sodium chloride   Intravenous Once  . atorvastatin  20 mg Oral q1800  . carvedilol  3.125 mg Oral BID WC  . levothyroxine  125 mcg Oral Q0600  . pantoprazole  40 mg Oral Daily  . polyethylene glycol  17 g Oral BID  . tamsulosin  0.4 mg Oral Daily  . traZODone  150 mg Oral QHS  . vitamin C  500 mg Oral Daily   Continuous Infusions:   LOS: 4 days  Verneita Griffes, MD [facility] 8:21 AM  If 7PM-7AM, please contact night-coverage [email or web address]

## 2019-02-15 NOTE — Progress Notes (Addendum)
Physical Therapy Treatment Patient Details Name: John Peck MRN: [id number] DOB: 1924-01-04 Today's Date: 02/15/2019    History of Present Illness Pt admitted with GIB and with hx of CVA, COPD, CKD, a-fib, dementia, and pacemaker    PT Comments    Pt with coughing and increased nasal drip today.  Pt encouraged to perform OOB to recliner.  Pt requiring as least mod assist +2 for safety to transfer at this time.  Pt reports being cold today as well so checked some vitals upon sitting in recliner: SPO2 97% on room air, 62 bpm, 97.8 *F temp.   Follow Up Recommendations  SNF     Equipment Recommendations  None recommended by PT    Recommendations for Other Services       Precautions / Restrictions Precautions Precautions: Fall    Mobility  Bed Mobility Overal bed mobility: Needs Assistance Bed Mobility: Supine to Sit     Supine to sit: Mod assist;+2 for physical assistance;HOB elevated     General bed mobility comments: assist for trunk today, pt utilized bed rail to self assist, HOB elevated  Transfers Overall transfer level: Needs assistance Equipment used: Rolling walker (2 wheeled) Transfers: Sit to/from Stand Sit to Stand: Mod assist;+2 physical assistance;From elevated surface Stand pivot transfers: Mod assist;+2 physical assistance       General transfer comment: assist to rise and steady, posterior lean present; pt requiring steadying assist for transfer to recliner  Ambulation/Gait                 Stairs             Wheelchair Mobility    Modified Rankin (Stroke Patients Only)       Balance   Sitting-balance support: Bilateral upper extremity supported;Feet supported Sitting balance-Leahy Scale: Poor     Standing balance support: Bilateral upper extremity supported Standing balance-Leahy Scale: Zero                              Cognition Arousal/Alertness: Awake/alert Behavior During Therapy: WFL for tasks  assessed/performed Overall Cognitive Status: History of cognitive impairments - at baseline                                        Exercises      General Comments        Pertinent Vitals/Pain Pain Assessment: No/denies pain    Home Living                      Prior Function            PT Goals (current goals can now be found in the care plan section) Progress towards PT goals: Progressing toward goals    Frequency    Min 3X/week      PT Plan Current plan remains appropriate    Co-evaluation              AM-PAC PT "6 Clicks" Mobility   Outcome Measure  Help needed turning from your back to your side while in a flat bed without using bedrails?: A Lot Help needed moving from lying on your back to sitting on the side of a flat bed without using bedrails?: A Lot Help needed moving to and from a bed to a chair (including a wheelchair)?: A Lot Help needed  standing up from a chair using your arms (e.g., wheelchair or bedside chair)?: A Lot Help needed to walk in hospital room?: Total Help needed climbing 3-5 steps with a railing? : Total 6 Click Score: 10    End of Session Equipment Utilized During Treatment: Gait belt Activity Tolerance: Patient limited by fatigue Patient left: with call bell/phone within reach;in chair;with chair alarm set   PT Visit Diagnosis: Muscle weakness (generalized) (M62.81)     Time: 8875-7972 PT Time Calculation (min) (ACUTE ONLY): 16 min  Charges:  $Therapeutic Activity: 8-22 mins                     [name], PT, DPT Acute Rehabilitation Services Office: [phone] Pager: [phone]  [name] 02/15/2019, 1:04 PM (Alexa, PT assisted with session)

## 2019-02-15 NOTE — Consult Note (Signed)
   [facility] CM Inpatient Consult   02/15/2019  BELAL SCALLON 03-Oct-1924 [id number]   Patient chart reviewed for potential [facility] Care Management services due to unplanned readmission risk score of 26% and multiple hospitalizations.   Per chart review, current disposition plan is for SNF. No [facility] CM needs at this time.  Netta Cedars, MSN, [facility] Liaison Nurse Mobile Phone [phone]  Toll free office [phone]

## 2019-02-16 LAB — CBC WITH DIFFERENTIAL/PLATELET
Abs Immature Granulocytes: 0.02 10*3/uL (ref 0.00–0.07)
Basophils Absolute: 0 10*3/uL (ref 0.0–0.1)
Basophils Relative: 0 %
Eosinophils Absolute: 0.2 10*3/uL (ref 0.0–0.5)
Eosinophils Relative: 4 %
HCT: 22.8 % — ABNORMAL LOW (ref 39.0–52.0)
Hemoglobin: 7.3 g/dL — ABNORMAL LOW (ref 13.0–17.0)
Immature Granulocytes: 0 %
Lymphocytes Relative: 14 %
Lymphs Abs: 0.7 10*3/uL (ref 0.7–4.0)
MCH: 31.6 pg (ref 26.0–34.0)
MCHC: 32 g/dL (ref 30.0–36.0)
MCV: 98.7 fL (ref 80.0–100.0)
Monocytes Absolute: 0.6 10*3/uL (ref 0.1–1.0)
Monocytes Relative: 11 %
Neutro Abs: 3.5 10*3/uL (ref 1.7–7.7)
Neutrophils Relative %: 71 %
Platelets: 123 10*3/uL — ABNORMAL LOW (ref 150–400)
RBC: 2.31 MIL/uL — ABNORMAL LOW (ref 4.22–5.81)
RDW: 16.8 % — ABNORMAL HIGH (ref 11.5–15.5)
WBC: 5 10*3/uL (ref 4.0–10.5)
nRBC: 0 % (ref 0.0–0.2)

## 2019-02-16 LAB — OCCULT BLOOD X 1 CARD TO LAB, STOOL: Fecal Occult Bld: POSITIVE — AB

## 2019-02-16 MED ORDER — SODIUM CHLORIDE 0.9 % IV SOLN
510.0000 mg | Freq: Once | INTRAVENOUS | Status: AC
  Administered 2019-02-16: 510 mg via INTRAVENOUS
  Filled 2019-02-16: qty 17

## 2019-02-16 NOTE — Progress Notes (Signed)
PROGRESS NOTE    John Peck  TIR:[id number] DOB: May 06, 1924 DOA: 02/10/2019 PCP: Seward Carol, MD   Brief Narrative:   85 y.o. male with a history of hypertension, sick sinus syndrome status post pacemaker, hyperlipidemia, chronic diastolic heart failure, CVA, COPD, CKD stage III, paroxysmal atrial fibrillation, hypothyroidism, dementia.  Patient presented secondary to noticing bright red blood per rectum.  Concern for GI bleed.  GI consulted.  He has received 2 unit of PRBC to date.  Feeds self goes to therapy at [facility] from [place] with assist--has been at this level of activity 7-8 months and has declined since last April when had a stroke   Assessment & Plan:   Principal Problem:   GI bleed Active Problems:   Essential hypertension   Sick sinus syndrome (HCC)   Hyperlipidemia   Chronic systolic CHF (congestive heart failure) (HCC)   COPD clinical dx/ no pfts on record   CKD (chronic kidney disease), stage III (HCC)   Paroxysmal atrial fibrillation (HCC)   Hypothyroidism   Dementia (HCC)   Bright red blood per rectum, ddx per GI diverticulosis vs hematochezia--hemorrhoidal Concern for low grade lower GI bleed.   2 units of PRBC to date.  -GI recommendations: stool softener to help reduce recurrent bleeding -As hemopdyn stable, even with dark stools, not a great candidate for any procedures -check labs in am--if stable, would confer with GI, but probably d/c to SNF in am 02/17/2019 -would not scope him unless emergent bleeding   COPD Shortness of breath resolved. On room air.  Low concern for COVID-19. Wheezing resolved.  -Switch to Albuterol q6 hours prn  Acute blood loss anemia on chronic anemia Normocytic. In setting of CKD and acute blood loss from hematochezia.  Baseline hemoglobin around 8.  Needed a unit 02/10/19, 02/13/19 -CBC BID -stable at 7.5 now  Hypothyroidism -Continue Synthroid  Paroxysmal atrial fibrillation Currently rate  controlled.  Regular rhythm.  Not on anticoagulation but is on aspirin and Plavix as an outpatient. -Continue Coreg -GI recommendations pending for antiplatelet therapy resumption (leaning towards not resuming meds)  Sick sinus syndrome Patient is status post pacemaker.  Essential hypertension Patient is on Imdur, Coreg as an outpatient. -Continue Coreg -Hold Imdur in setting of active GI bleed and possible hypotension -Hydralazine IV prn  Hyperlipidemia -Continue Lipitor  Chronic diastolic heart failure Currently stable.  Not on diuretic therapy as an outpatient.  Currently on Coreg. -Continue Coreg -currently -296 cc  Poor hearing Noted. Difficult to communicate with patient at times.  History of CVA Currently on aspirin and Plavix as an outpatient. Held secondary to active GI bleeding. Will need to be addressed prior to discharge as mentioned above   DVT prophylaxis: SCDs Code Status:   Code Status: DNR Family Communication: None at bedside Disposition Plan: Discharge pending GI workup for hematochezia   Consultants:   [facility]  Procedures:   None  Antimicrobials:  None    Subjective:  Awake alert but a little sleepy Drooling some No cp no f no chillno reports of dark or tarry stool Gi reviewed patient today as well  Objective: Vitals:   02/16/19 0443 02/16/19 0803 02/16/19 1041 02/16/19 1209  BP: (!) 155/68   (!) 160/64  Pulse: 60  63 (!) 59  Resp: 16   18  Temp: 99.2 F (37.3 C)   98.6 F (37 C)  TempSrc: Oral   Oral  SpO2: 96% 96% 100% 97%  Weight: 58 kg  Height:        Intake/Output Summary (Last 24 hours) at 02/16/2019 1426 Last data filed at 02/16/2019 0446 Gross per 24 hour  Intake 360 ml  Output 900 ml  Net -540 ml   Filed Weights   02/12/19 0500 02/14/19 0355 02/16/19 0443  Weight: 59.8 kg 58.2 kg 58 kg    Examination:  eomi ncat no ict no pallor, sebaceous cyst of L jaw [place] today cta b no added sound abd soft nt nd  no rebound No le edema    Data Reviewed: I have personally reviewed following labs and imaging studies  CBC: Recent Labs  Lab 02/10/19 1200  02/14/19 0509 02/14/19 1650 02/15/19 0515 02/15/19 1648 02/16/19 0457  WBC 4.9   < > 4.8 4.8 5.3 5.9 5.0  NEUTROABS 3.5  --   --   --   --   --  3.5  HGB 6.4*   < > 7.1* 7.5* 7.5* 8.1* 7.3*  HCT 20.9*   < > 23.1* 23.6* 23.4* 25.6* 22.8*  MCV 105.0*   < > 99.1 99.6 98.7 100.0 98.7  PLT 165   < > 132* 132* 124* 141* 123*   < > = values in this interval not displayed.   Basic Metabolic Panel: Recent Labs  Lab 02/10/19 1200 02/11/19 0528  NA 140 139  K 4.5 4.5  CL 112* 111  CO2 22 20*  GLUCOSE 124* 75  BUN 40* 37*  CREATININE 1.85* 1.59*  CALCIUM 7.9* 8.0*   GFR: Estimated Creatinine Clearance: 24.3 mL/min (A) (by C-G formula based on SCr of 1.59 mg/dL (H)). Liver Function Tests: Recent Labs  Lab 02/10/19 1200 02/11/19 0528  AST 14* 12*  ALT 14 13  ALKPHOS 46 41  BILITOT 0.4 0.8  PROT 6.3* 6.2*  ALBUMIN 2.3* 2.3*   No results for input(s): LIPASE, AMYLASE in the last 168 hours. No results for input(s): AMMONIA in the last 168 hours. Coagulation Profile: Recent Labs  Lab 02/10/19 1200  INR 1.5*   Cardiac Enzymes: No results for input(s): CKTOTAL, CKMB, CKMBINDEX, TROPONINI in the last 168 hours. BNP (last 3 results) No results for input(s): PROBNP in the last 8760 hours. HbA1C: No results for input(s): HGBA1C in the last 72 hours. CBG: No results for input(s): GLUCAP in the last 168 hours. Lipid Profile: No results for input(s): CHOL, HDL, LDLCALC, TRIG, CHOLHDL, LDLDIRECT in the last 72 hours. Thyroid Function Tests: No results for input(s): TSH, T4TOTAL, FREET4, T3FREE, THYROIDAB in the last 72 hours. Anemia Panel: No results for input(s): VITAMINB12, FOLATE, FERRITIN, TIBC, IRON, RETICCTPCT in the last 72 hours. Sepsis Labs: No results for input(s): PROCALCITON, LATICACIDVEN in the last 168 hours.   Recent Results (from the past 240 hour(s))  MRSA PCR Screening     Status: None   Collection Time: 02/15/19  5:19 PM  Result Value Ref Range Status   MRSA by PCR NEGATIVE NEGATIVE Final    Comment:        The GeneXpert MRSA Assay (FDA approved for NASAL specimens only), is one component of a comprehensive MRSA colonization surveillance program. It is not intended to diagnose MRSA infection nor to guide or monitor treatment for MRSA infections. Performed at [facility], [place] [address] [place], [place] [address]      Radiology Studies: No results found.   Scheduled Meds: . sodium chloride   Intravenous Once  . atorvastatin  20 mg Oral q1800  . carvedilol  3.125  mg Oral BID WC  . ipratropium-albuterol  3 mL Nebulization BID  . levothyroxine  125 mcg Oral Q0600  . pantoprazole  40 mg Oral Daily  . polyethylene glycol  17 g Oral BID  . tamsulosin  0.4 mg Oral Daily  . traZODone  150 mg Oral QHS  . vitamin C  500 mg Oral Daily   Continuous Infusions:   LOS: 5 days    Verneita Griffes, MD [facility] 2:26 PM  If 7PM-7AM, please contact night-coverage [email or web address]

## 2019-02-16 NOTE — Progress Notes (Signed)
Physical Therapy Treatment Patient Details Name: John Peck MRN: [id number] DOB: 1924-03-14 Today's Date: 02/16/2019    History of Present Illness Pt admitted with GIB and with hx of CVA, COPD, CKD, a-fib, dementia, and pacemaker    PT Comments    Pt requesting to get OOB but requiring assist of 2 2* generalized weakness and balance deficits with noted Left and posterior lean and buckling at knees with standing.  Follow Up Recommendations  SNF     Equipment Recommendations  None recommended by PT    Recommendations for Other Services       Precautions / Restrictions Precautions Precautions: Fall Restrictions Weight Bearing Restrictions: No    Mobility  Bed Mobility Overal bed mobility: Needs Assistance Bed Mobility: Supine to Sit     Supine to sit: Min assist;Mod assist;+2 for physical assistance;+2 for safety/equipment;HOB elevated     General bed mobility comments: assist for trunk today, pt utilized bed rail to self assist, HOB elevated  Transfers Overall transfer level: Needs assistance Equipment used: Rolling walker (2 wheeled) Transfers: Sit to/from Stand Sit to Stand: +2 physical assistance;From elevated surface;Min assist;Mod assist         General transfer comment: assist to rise and steady, posterior and Left lean present; pt requiring steadying assist for transfer to recliner  Ambulation/Gait Ambulation/Gait assistance: +2 physical assistance;+2 safety/equipment;Min assist;Mod assist Gait Distance (Feet): 3 Feet Assistive device: Rolling walker (2 wheeled) Gait Pattern/deviations: Step-through pattern;Decreased step length - right;Decreased step length - left;Shuffle;Trunk flexed Gait velocity: decr   General Gait Details: cues for posture and position from RW; Physical assist required to correct for L lean and buckling at knees   Stairs             Wheelchair Mobility    Modified Rankin (Stroke Patients Only)       Balance  Overall balance assessment: Needs assistance Sitting-balance support: Feet supported;Single extremity supported Sitting balance-Leahy Scale: Poor Sitting balance - Comments: L and posterior lean   Standing balance support: Bilateral upper extremity supported Standing balance-Leahy Scale: Poor                              Cognition Arousal/Alertness: Awake/alert Behavior During Therapy: WFL for tasks assessed/performed Overall Cognitive Status: History of cognitive impairments - at baseline                                        Exercises      General Comments        Pertinent Vitals/Pain Pain Assessment: No/denies pain    Home Living                      Prior Function            PT Goals (current goals can now be found in the care plan section) Acute Rehab PT Goals Patient Stated Goal: Get out of bed PT Goal Formulation: Patient unable to participate in goal setting Time For Goal Achievement: 02/26/19 Potential to Achieve Goals: Fair Progress towards PT goals: Progressing toward goals    Frequency    Min 3X/week      PT Plan Current plan remains appropriate    Co-evaluation              AM-PAC PT "6 Clicks" Mobility   Outcome Measure  Help needed turning from your back to your side while in a flat bed without using bedrails?: A Lot Help needed moving from lying on your back to sitting on the side of a flat bed without using bedrails?: A Lot Help needed moving to and from a bed to a chair (including a wheelchair)?: A Lot Help needed standing up from a chair using your arms (e.g., wheelchair or bedside chair)?: A Lot Help needed to walk in hospital room?: Total Help needed climbing 3-5 steps with a railing? : Total 6 Click Score: 10    End of Session Equipment Utilized During Treatment: Gait belt Activity Tolerance: Patient limited by fatigue Patient left: with call bell/phone within reach;in chair;with chair  alarm set Nurse Communication: Mobility status PT Visit Diagnosis: Muscle weakness (generalized) (M62.81)     Time: 1044-1100 PT Time Calculation (min) (ACUTE ONLY): 16 min  Charges:  $Therapeutic Activity: 8-22 mins                     [facility] Pager [phone] Office [phone]    Daneil Beem 02/16/2019, 12:57 PM

## 2019-02-16 NOTE — Progress Notes (Signed)
No BMs this shift. Pt sat up in chair majority of afternoon and tolerated well. Back to bed with heavy 2 assist and RW.

## 2019-02-16 NOTE — Progress Notes (Signed)
[facility] PROGRESS NOTE Subjective Patient apparently had some blood of a minimal nature 2 days ago.  He is only been on the MiraLAX now for approximately 2 days.  Staff notes that his stools are soft.  He has had no stools overnight or no bleeding since yesterday morning.  Objective: Vital signs in last 24 hours: Temp:  [97.8 F (36.6 C)-99.2 F (37.3 C)] 99.2 F (37.3 C) (04/09 0443) Pulse Rate:  [60-62] 60 (04/09 0443) Resp:  [16-19] 16 (04/09 0443) BP: (133-157)/(56-78) 155/68 (04/09 0443) SpO2:  [95 %-100 %] 96 % (04/09 0803) Weight:  [58 kg] 58 kg (04/09 0443) Last BM Date: 02/13/19  Intake/Output from previous day: 04/08 0701 - 04/09 0700 In: 1100 [P.O.:1100] Out: 1100 [Urine:1100] Intake/Output this shift: No intake/output data recorded.  PE: General--patient laying in bed alert no distress answers questions appropriately  Abdomen--soft nontender Rectal- examine the anal area with staff holding flashlight no gross masses or hemorrhoids seen.  Digital exam reveals soft stool no clear masses.  Stool is brown with no signs of blood.  Stool sample sent to lab for guaiac testing.  Lab Results: Recent Labs    02/14/19 0509 02/14/19 1650 02/15/19 0515 02/15/19 1648 02/16/19 0457  WBC 4.8 4.8 5.3 5.9 5.0  HGB 7.1* 7.5* 7.5* 8.1* 7.3*  HCT 23.1* 23.6* 23.4* 25.6* 22.8*  PLT 132* 132* 124* 141* 123*   BMET No results for input(s): NA, K, CL, CO2, CREATININE in the last 72 hours. LFT No results for input(s): PROT, AST, ALT, ALKPHOS, BILITOT, BILIDIR, IBILI in the last 72 hours. PT/INR No results for input(s): LABPROT, INR in the last 72 hours. PANCREAS No results for input(s): LIPASE in the last 72 hours.       Studies/Results: No results found.  Medications: I have reviewed the patient's current medications.  Assessment:   1.  Rectal bleeding with secondary anemia.  Suspect that he has been bleeding from hemorrhoids.  There is no signs today  of continued bleeding.  His hemoglobin has slowly drifted down.  His iron level has slowly diminished.   Plan: 1.  At this point there are no signs of active bleeding.  He is only been on the MiraLAX a couple of days his stools are soft and brown.  His iron is slightly low. 2.  If he is going to be discharged, would consider giving him a dose of IV iron and discharged on MiraLAX.  Would suggest that they check a hemoglobin after discharge next week.  At 85 years of age I would not recommend diagnostic testing unless this continues to be a problem.  Please call us if we can be of any further help.   Nancy Fetter 02/16/2019, 8:39 AM  This note was created using voice recognition software. Minor errors may Have occurred unintentionally.  Pager: [phone] If no answer or after hours call [phone]

## 2019-02-17 DIAGNOSIS — R278 Other lack of coordination: Secondary | ICD-10-CM | POA: Diagnosis not present

## 2019-02-17 DIAGNOSIS — R2681 Unsteadiness on feet: Secondary | ICD-10-CM | POA: Diagnosis not present

## 2019-02-17 DIAGNOSIS — R1312 Dysphagia, oropharyngeal phase: Secondary | ICD-10-CM | POA: Diagnosis not present

## 2019-02-17 DIAGNOSIS — R0989 Other specified symptoms and signs involving the circulatory and respiratory systems: Secondary | ICD-10-CM | POA: Diagnosis not present

## 2019-02-17 DIAGNOSIS — M6281 Muscle weakness (generalized): Secondary | ICD-10-CM | POA: Diagnosis not present

## 2019-02-17 DIAGNOSIS — R2689 Other abnormalities of gait and mobility: Secondary | ICD-10-CM | POA: Diagnosis not present

## 2019-02-17 DIAGNOSIS — R0602 Shortness of breath: Secondary | ICD-10-CM | POA: Diagnosis not present

## 2019-02-17 DIAGNOSIS — R471 Dysarthria and anarthria: Secondary | ICD-10-CM | POA: Diagnosis not present

## 2019-02-17 DIAGNOSIS — R41841 Cognitive communication deficit: Secondary | ICD-10-CM | POA: Diagnosis not present

## 2019-02-17 LAB — CBC WITH DIFFERENTIAL/PLATELET
Abs Immature Granulocytes: 0.02 10*3/uL (ref 0.00–0.07)
Basophils Absolute: 0 10*3/uL (ref 0.0–0.1)
Basophils Relative: 1 %
Eosinophils Absolute: 0.2 10*3/uL (ref 0.0–0.5)
Eosinophils Relative: 4 %
HCT: 23 % — ABNORMAL LOW (ref 39.0–52.0)
Hemoglobin: 7.3 g/dL — ABNORMAL LOW (ref 13.0–17.0)
Immature Granulocytes: 1 %
Lymphocytes Relative: 16 %
Lymphs Abs: 0.7 10*3/uL (ref 0.7–4.0)
MCH: 32.2 pg (ref 26.0–34.0)
MCHC: 31.7 g/dL (ref 30.0–36.0)
MCV: 101.3 fL — ABNORMAL HIGH (ref 80.0–100.0)
Monocytes Absolute: 0.5 10*3/uL (ref 0.1–1.0)
Monocytes Relative: 12 %
Neutro Abs: 2.9 10*3/uL (ref 1.7–7.7)
Neutrophils Relative %: 66 %
Platelets: 135 10*3/uL — ABNORMAL LOW (ref 150–400)
RBC: 2.27 MIL/uL — ABNORMAL LOW (ref 4.22–5.81)
RDW: 17.1 % — ABNORMAL HIGH (ref 11.5–15.5)
WBC: 4.3 10*3/uL (ref 4.0–10.5)
nRBC: 0 % (ref 0.0–0.2)

## 2019-02-17 LAB — RENAL FUNCTION PANEL
Albumin: 2.3 g/dL — ABNORMAL LOW (ref 3.5–5.0)
Anion gap: 7 (ref 5–15)
BUN: 24 mg/dL — ABNORMAL HIGH (ref 8–23)
CO2: 23 mmol/L (ref 22–32)
Calcium: 8.1 mg/dL — ABNORMAL LOW (ref 8.9–10.3)
Chloride: 109 mmol/L (ref 98–111)
Creatinine, Ser: 1.75 mg/dL — ABNORMAL HIGH (ref 0.61–1.24)
GFR calc Af Amer: 38 mL/min — ABNORMAL LOW (ref >60)
GFR calc non Af Amer: 33 mL/min — ABNORMAL LOW (ref >60)
Glucose, Bld: 91 mg/dL (ref 70–99)
Phosphorus: 3 mg/dL (ref 2.5–4.6)
Potassium: 5.2 mmol/L — ABNORMAL HIGH (ref 3.5–5.1)
Sodium: 139 mmol/L (ref 135–145)

## 2019-02-17 MED ORDER — CARVEDILOL 3.125 MG PO TABS: 3.1250 mg | ORAL_TABLET | Freq: Two times a day (BID) | ORAL | Status: AC

## 2019-02-17 NOTE — Progress Notes (Signed)
Attempted to call report x2 to [place]. No answer. Will re-attempt later.

## 2019-02-17 NOTE — Discharge Summary (Signed)
Physician Discharge Summary  John Peck NID:[id number] DOB: [birth year] DOA: 02/10/2019  PCP: John Carol, MD  Admit date: 02/10/2019 Discharge date: 02/17/2019  Time spent: 45 minutes  Recommendations for Outpatient Follow-up:  1. No more aspirin or Plavix, discontinue Depakote as well as Imdur 2. Needs CBC in 1 week 3. Needs goals of care as an outpatient-is at relatively risk for stroke?  7%, but family's decisions were not to give antiplatelet agent going forward please reconvene with them and ask them about this so that they may discuss this  Discharge Diagnoses:  Principal Problem:   GI bleed Active Problems:   Essential hypertension   Sick sinus syndrome (HCC)   Hyperlipidemia   Chronic systolic CHF (congestive heart failure) (HCC)   COPD clinical dx/ no pfts on record   CKD (chronic kidney disease), stage III ([place])   Paroxysmal atrial fibrillation ([place])   Hypothyroidism   Dementia ([place])   Discharge Condition: Improved  Diet recommendation: Soft  Filed Weights   02/14/19 0355 02/16/19 0443 02/17/19 0502  Weight: 58.2 kg 58 kg 57.8 kg    History of present illness:  85 y.o. male with a history of hypertension, sick sinus syndrome status post pacemaker, hyperlipidemia, chronic diastolic heart failure, CVA, COPD, CKD stage III, paroxysmal atrial fibrillation, hypothyroidism, dementia.  Patient presented secondary to noticing bright red blood per rectum.  Concern for GI bleed.  GI consulted.  He has received 2 unit of PRBC to date.  Feeds self goes to therapy at [facility] from [place] with assist--has been at this level of activity 7-8 months and has declined since last April when had a stroke  Hospital Course:  Bright red blood per rectum, ddx per GI diverticulosis vs hematochezia--hemorrhoidal Concern for low grade lower GI bleed.   2 units of PRBC to date.  -GI recommendations: stool softener to help reduce recurrent bleeding -As hemopdyn stable,  even with dark stools, not a great candidate for any procedures -Hemoglobin stabilized on 4/10-7.3 -He is not a candidate for colonoscopy given his advanced age and lack of mortality benefit even if we do find a large tumor or mass per Dr. Oletta Peck  COPD Shortness of breath resolved. On room air.  Low concern for COVID-19. Wheezing resolved.  -Switch to Albuterol q6 hours prn  Acute blood loss anemia on chronic anemia Normocytic. In setting of CKD and acute blood loss from hematochezia.  Baseline hemoglobin around 8.  Needed a unit 02/10/19, 02/13/19 -CBC BID -stable at 7.5 now  Hypothyroidism -Continue Synthroid  Paroxysmal atrial fibrillation Currently rate controlled.  Regular rhythm.  Not on anticoagulation but is on aspirin and Plavix as an outpatient. -Continue Coreg -I had a long discussion with the family on multiple episodes and we considered risk benefits and alternatives to aspirin and Plavix for antiplatelet agent in lieu of anticoagulant with the setting of GI bleed The patient's daughter and patient's spouse discussed the same and felt that patient would likely not benefit from antiplatelet agents going forward because of risk of bleeding and they want him to be comfortable Recommend outpatient goals of care  Sick sinus syndrome Patient is status post pacemaker.  Essential hypertension Patient is on Imdur, Coreg as an outpatient. -Continue Coreg -Hold Imdur in setting of active GI bleed and possible hypotension -Hydralazine IV prn  Hyperlipidemia -Continue Lipitor  Chronic diastolic heart failure Currently stable.  Not on diuretic therapy as an outpatient.  Currently on Coreg. -Continue Coreg  Poor hearing Noted.  Difficult to communicate with patient at times.  History of CVA Long discussion with family as above   Consultations:  GI  Discharge Exam: Vitals:   02/17/19 0502 02/17/19 0846  BP: (!) 162/66   Pulse: 60   Resp: 16   Temp: 99.1 F  (37.3 C)   SpO2: 98% 97%    General: Awake alert oriented no distress Cardiovascular: S1-S2 no murmur Respiratory: Clinically clear no added sound Abdomen soft nontender nondistended no rebound or guarding  Discharge Instructions   Discharge Instructions    Diet - low sodium heart healthy   Complete by:  As directed    Discharge instructions   Complete by:  As directed    No more aspirin or Plavix or other blood thinners going forward, recommend that the patient continue iron and get a CBC count in about 1 week We had a long discussion amongst family about goals of care and it was family's wishes not to use antiplatelet agents going forward   Increase activity slowly   Complete by:  As directed      Allergies as of 02/17/2019   No Known Allergies     Medication List    STOP taking these medications   aspirin 81 MG tablet   clopidogrel 75 MG tablet Commonly known as:  PLAVIX   desonide 0.05 % cream Commonly known as:  DESOWEN   divalproex 125 MG capsule Commonly known as:  Depakote Sprinkles   feeding supplement (ENSURE ENLIVE) Liqd   isosorbide mononitrate 30 MG 24 hr tablet Commonly known as:  IMDUR   lidocaine 4 % cream Commonly known as:  LMX   lidocaine 5 % Commonly known as:  LIDODERM   psyllium 95 % Pack Commonly known as:  HYDROCIL/METAMUCIL     TAKE these medications   acetaminophen 325 MG tablet Commonly known as:  TYLENOL Take 650 mg by mouth 4 (four) times daily.   albuterol 108 (90 Base) MCG/ACT inhaler Commonly known as:  PROVENTIL HFA;VENTOLIN HFA Inhale 2 puffs into the lungs every 4 (four) hours as needed for wheezing or shortness of breath (or coughing).   atorvastatin 20 MG tablet Commonly known as:  LIPITOR Take 20 mg by mouth daily at 6 PM.   CAMPHOR-EUCALYPTUS-MENTHOL EX Apply 1 application topically as needed (congestion). Every 1 hour prn under nasal and chest   carvedilol 3.125 MG tablet Commonly known as:  COREG Take 1  tablet (3.125 mg total) by mouth 2 (two) times daily with a meal. What changed:    medication strength  how much to take   ferrous sulfate 325 (65 FE) MG tablet Take 1 tablet (325 mg total) by mouth 2 (two) times daily with a meal. What changed:  when to take this   folic acid 1 MG tablet Commonly known as:  FOLVITE Take 1 mg by mouth daily.   ipratropium-albuterol 0.5-2.5 (3) MG/3ML Soln Commonly known as:  DUONEB Take 3 mLs by nebulization 2 (two) times daily.   latanoprost 0.005 % ophthalmic solution Commonly known as:  XALATAN Place 1 drop into both eyes 2 (two) times a week. On mondays and wednesdays   levothyroxine 125 MCG tablet Commonly known as:  SYNTHROID, LEVOTHROID TAKE 1 TABLET BY MOUTH EVERY DAY BEFORE BREAKFAST What changed:  See the new instructions.   Melatonin 3 MG Tabs Take 3 mg by mouth at bedtime.   nitroGLYCERIN 0.4 MG SL tablet Commonly known as:  NITROSTAT Every 5 min x3 PRN chest pain  pantoprazole 40 MG tablet Commonly known as:  PROTONIX Take 1 tablet (40 mg total) by mouth daily.   polyethylene glycol 17 g packet Commonly known as:  MIRALAX / GLYCOLAX Take 17 g by mouth daily.   shark liver oil-cocoa butter 0.25-3-85.5 % suppository Commonly known as:  PREPARATION H Place 1 suppository rectally as needed for hemorrhoids.   tamsulosin 0.4 MG Caps capsule Commonly known as:  FLOMAX Take 1 capsule (0.4 mg total) by mouth daily.   traZODone 150 MG tablet Commonly known as:  DESYREL Take 150 mg by mouth at bedtime.   vitamin C 500 MG tablet Commonly known as:  ASCORBIC ACID Take 500 mg by mouth daily.      No Known Allergies Follow-up Information    [facility] Preferred SNF Follow up.   Specialty:  Skilled Nursing Facility Contact information: [place] [phone]           The results of significant diagnostics from this hospitalization (including imaging, microbiology,  ancillary and laboratory) are listed below for reference.    Significant Diagnostic Studies: No results found.  Microbiology: Recent Results (from the past 240 hour(s))  MRSA PCR Screening     Status: None   Collection Time: 02/15/19  5:19 PM  Result Value Ref Range Status   MRSA by PCR NEGATIVE NEGATIVE Final    Comment:        The GeneXpert MRSA Assay (FDA approved for NASAL specimens only), is one component of a comprehensive MRSA colonization surveillance program. It is not intended to diagnose MRSA infection nor to guide or monitor treatment for MRSA infections. Performed at [facility], [place] [address], [place],  [address]      Labs: Basic Metabolic Panel: Recent Labs  Lab 02/10/19 1200 02/11/19 0528 02/17/19 0446  NA 140 139 139  K 4.5 4.5 5.2*  CL 112* 111 109  CO2 22 20* 23  GLUCOSE 124* 75 91  BUN 40* 37* 24*  CREATININE 1.85* 1.59* 1.75*  CALCIUM 7.9* 8.0* 8.1*  PHOS  --   --  3.0   Liver Function Tests: Recent Labs  Lab 02/10/19 1200 02/11/19 0528 02/17/19 0446  AST 14* 12*  --   ALT 14 13  --   ALKPHOS 46 41  --   BILITOT 0.4 0.8  --   PROT 6.3* 6.2*  --   ALBUMIN 2.3* 2.3* 2.3*   No results for input(s): LIPASE, AMYLASE in the last 168 hours. No results for input(s): AMMONIA in the last 168 hours. CBC: Recent Labs  Lab 02/10/19 1200  02/14/19 1650 02/15/19 0515 02/15/19 1648 02/16/19 0457 02/17/19 0446  WBC 4.9   < > 4.8 5.3 5.9 5.0 4.3  NEUTROABS 3.5  --   --   --   --  3.5 2.9  HGB 6.4*   < > 7.5* 7.5* 8.1* 7.3* 7.3*  HCT 20.9*   < > 23.6* 23.4* 25.6* 22.8* 23.0*  MCV 105.0*   < > 99.6 98.7 100.0 98.7 101.3*  PLT 165   < > 132* 124* 141* 123* 135*   < > = values in this interval not displayed.   Cardiac Enzymes: No results for input(s): CKTOTAL, CKMB, CKMBINDEX, TROPONINI in the last 168 hours. BNP: BNP (last 3 results) Recent Labs    06/02/18 1628 07/29/18 1119 08/09/18 1629  BNP 627.9*  360.4* 1,084.8*    ProBNP (last 3 results) No results for input(s): PROBNP in  the last 8760 hours.  CBG: No results for input(s): GLUCAP in the last 168 hours.     Signed:  Nita Sells MD   [facility] 02/17/2019, 9:45 AM

## 2019-02-17 NOTE — TOC Transition Note (Signed)
Transition of Care [facility]) - CM/SW Discharge Note   Patient Details  Name: John Peck MRN: [id number] Date of Birth: 1924/07/03  Transition of [facility]) CM/SW Contact:  Wende Neighbors, LCSW Phone Number: 02/17/2019, 10:32 AM   Clinical Narrative:   Patient going back to [facility] via [facility].  RN to call [phone] (rm# [id number]) and ask to speak to RN Famata in the [employer]. Patients daughter aware of discharge    Final next level of care: [place] Barriers to Discharge: No Barriers Identified   Patient Goals and CMS Choice Patient states their goals for this hospitalization and ongoing recovery are:: (return back to [place]) CMS Medicare.gov Compare Post Acute Care list provided to:: Patient Represenative (must comment)    Discharge Placement              Patient [facility] bed at: [facility] Patient to be transferred to facility by: [facility] Name of family member notified: spoke to daughter Dorian Pod on the phone  Patient and family notified of of transfer: 02/17/19  Discharge Plan and Services   Discharge Planning Services: CM Consult                      Social Determinants of Health ([place]) Interventions     Readmission Risk Interventions Readmission Risk Prevention Plan 02/14/2019 02/13/2019  Transportation Screening Complete Complete  PCP or Specialist Appt within 3-5 Days Complete -  HRI or Home Care Consult Complete -  Social Work Consult for [place] Planning/Counseling Complete -  Palliative Care Screening Complete -  Medication Review Press photographer) Complete Complete  PCP or Specialist appointment within 3-5 days of discharge - Complete  HRI or [place] - Complete  SW Recovery Care/Counseling Consult - Complete  [place] - Complete  Some recent data might be hidden

## 2019-02-17 NOTE — Progress Notes (Signed)
Subjective: The patient was seen and examined at bedside. Discussed with patient's nurse, he had 1 bowel movement today which was light brown in color, 1 bowel movement yesterday evening with minimal specks of dark blood.  Objective: Vital signs in last 24 hours: Temp:  [97.6 F (36.4 C)-99.1 F (37.3 C)] 99.1 F (37.3 C) (04/10 0502) Pulse Rate:  [59-63] 60 (04/10 0502) Resp:  [16-18] 16 (04/10 0502) BP: (154-162)/(64-66) 162/66 (04/10 0502) SpO2:  [96 %-100 %] 97 % (04/10 0846) Weight:  [57.8 kg] 57.8 kg (04/10 0502) Weight change: -0.181 kg Last BM Date: 02/16/19  PE: Hard of hearing GENERAL: Mild pallor, not in distress ABDOMEN: Soft, nondistended, nontender EXTREMITIES: No deformity  Lab Results: Results for orders placed or performed during the hospital encounter of 02/10/19 (from the past 48 hour(s))  CBC     Status: Abnormal   Collection Time: 02/15/19  4:48 PM  Result Value Ref Range   WBC 5.9 4.0 - 10.5 K/uL   RBC 2.56 (L) 4.22 - 5.81 MIL/uL   Hemoglobin 8.1 (L) 13.0 - 17.0 g/dL   HCT 25.6 (L) 39.0 - 52.0 %   MCV 100.0 80.0 - 100.0 fL   MCH 31.6 26.0 - 34.0 pg   MCHC 31.6 30.0 - 36.0 g/dL   RDW 17.0 (H) 11.5 - 15.5 %   Platelets 141 (L) 150 - 400 K/uL   nRBC 0.0 0.0 - 0.2 %    Comment: Performed at [facility], [place] [address], [place], [place] [address]  MRSA PCR Screening     Status: None   Collection Time: 02/15/19  5:19 PM  Result Value Ref Range   MRSA by PCR NEGATIVE NEGATIVE    Comment:        The GeneXpert MRSA Assay (FDA approved for NASAL specimens only), is one component of a comprehensive MRSA colonization surveillance program. It is not intended to diagnose MRSA infection nor to guide or monitor treatment for MRSA infections. Performed at [facility], [place] [address], [place], [place] [address]   CBC with Differential/Platelet     Status: Abnormal   Collection Time: 02/16/19  4:57 AM  Result Value  Ref Range   WBC 5.0 4.0 - 10.5 K/uL   RBC 2.31 (L) 4.22 - 5.81 MIL/uL   Hemoglobin 7.3 (L) 13.0 - 17.0 g/dL   HCT 22.8 (L) 39.0 - 52.0 %   MCV 98.7 80.0 - 100.0 fL   MCH 31.6 26.0 - 34.0 pg   MCHC 32.0 30.0 - 36.0 g/dL   RDW 16.8 (H) 11.5 - 15.5 %   Platelets 123 (L) 150 - 400 K/uL   nRBC 0.0 0.0 - 0.2 %   Neutrophils Relative % 71 %   Neutro Abs 3.5 1.7 - 7.7 K/uL   Lymphocytes Relative 14 %   Lymphs Abs 0.7 0.7 - 4.0 K/uL   Monocytes Relative 11 %   Monocytes Absolute 0.6 0.1 - 1.0 K/uL   Eosinophils Relative 4 %   Eosinophils Absolute 0.2 0.0 - 0.5 K/uL   Basophils Relative 0 %   Basophils Absolute 0.0 0.0 - 0.1 K/uL   Immature Granulocytes 0 %   Abs Immature Granulocytes 0.02 0.00 - 0.07 K/uL    Comment: Performed at [facility], [place] [address] [place], [place] [address]  Occult blood card to lab, stool     Status: Abnormal   Collection Time: 02/16/19  8:31 AM  Result Value Ref Range  Fecal Occult Bld POSITIVE (A) NEGATIVE    Comment: Performed at [facility], [place] [address], [place], [place] [address]  CBC with Differential/Platelet     Status: Abnormal   Collection Time: 02/17/19  4:46 AM  Result Value Ref Range   WBC 4.3 4.0 - 10.5 K/uL   RBC 2.27 (L) 4.22 - 5.81 MIL/uL   Hemoglobin 7.3 (L) 13.0 - 17.0 g/dL   HCT 23.0 (L) 39.0 - 52.0 %   MCV 101.3 (H) 80.0 - 100.0 fL   MCH 32.2 26.0 - 34.0 pg   MCHC 31.7 30.0 - 36.0 g/dL   RDW 17.1 (H) 11.5 - 15.5 %   Platelets 135 (L) 150 - 400 K/uL   nRBC 0.0 0.0 - 0.2 %   Neutrophils Relative % 66 %   Neutro Abs 2.9 1.7 - 7.7 K/uL   Lymphocytes Relative 16 %   Lymphs Abs 0.7 0.7 - 4.0 K/uL   Monocytes Relative 12 %   Monocytes Absolute 0.5 0.1 - 1.0 K/uL   Eosinophils Relative 4 %   Eosinophils Absolute 0.2 0.0 - 0.5 K/uL   Basophils Relative 1 %   Basophils Absolute 0.0 0.0 - 0.1 K/uL   Immature Granulocytes 1 %   Abs Immature Granulocytes 0.02 0.00 - 0.07 K/uL    Comment:  Performed at [facility], [place] [address], [place], [place] [address]  Renal function panel     Status: Abnormal   Collection Time: 02/17/19  4:46 AM  Result Value Ref Range   Sodium 139 135 - 145 mmol/L   Potassium 5.2 (H) 3.5 - 5.1 mmol/L   Chloride 109 98 - 111 mmol/L   CO2 23 22 - 32 mmol/L   Glucose, Bld 91 70 - 99 mg/dL   BUN 24 (H) 8 - 23 mg/dL   Creatinine, Ser 1.75 (H) 0.61 - 1.24 mg/dL   Calcium 8.1 (L) 8.9 - 10.3 mg/dL   Phosphorus 3.0 2.5 - 4.6 mg/dL   Albumin 2.3 (L) 3.5 - 5.0 g/dL   GFR calc non Af Amer 33 (L) >60 mL/min   GFR calc Af Amer 38 (L) >60 mL/min   Anion gap 7 5 - 15    Comment: Performed at [facility], [place] [address], [place], [place] [address]    Studies/Results: No results found.  Medications: I have reviewed the patient's current medications.  Assessment: Rectal bleeding, prominent diverticulosis noted in descending colon and sigmoid on CAT scan from 11/2018 Hemoglobin stable at 7.5/7.5/8.1/7.3/7.3 No further evidence of rectal bleeding No hemodynamic instability  Plan: Being discharged to [facility] place, with plans to hold anticoagulation/antiplatelet. Recommend continuing stool softeners and MiraLAX on a regular basis.   Ronnette Juniper 02/17/2019, 9:55 AM   Pager [phone] If no answer or after 5 PM call [phone]

## 2019-02-17 NOTE — Care Management Important Message (Signed)
Important Message  Patient Details  Name: John Peck MRN: [id number] Date of Birth: 10-23-1924   Medicare Important Message Given:  Yes    Kerin Salen 02/17/2019, 11:19 AMImportant Message  Patient Details  Name: John Peck MRN: [id number] Date of Birth: 1924-05-18   Medicare Important Message Given:  Yes    Kerin Salen 02/17/2019, 11:19 AM

## 2019-02-17 NOTE — Progress Notes (Signed)
Report called to [facility] at [place]. [facility] here to transport patient.

## 2019-02-20 DIAGNOSIS — I639 Cerebral infarction, unspecified: Secondary | ICD-10-CM | POA: Diagnosis not present

## 2019-02-20 DIAGNOSIS — I5032 Chronic diastolic (congestive) heart failure: Secondary | ICD-10-CM | POA: Diagnosis not present

## 2019-02-20 DIAGNOSIS — I495 Sick sinus syndrome: Secondary | ICD-10-CM | POA: Diagnosis not present

## 2019-02-20 DIAGNOSIS — K922 Gastrointestinal hemorrhage, unspecified: Secondary | ICD-10-CM | POA: Diagnosis not present

## 2019-02-21 DIAGNOSIS — K9289 Other specified diseases of the digestive system: Secondary | ICD-10-CM | POA: Diagnosis not present

## 2019-02-21 DIAGNOSIS — D6949 Other primary thrombocytopenia: Secondary | ICD-10-CM | POA: Diagnosis not present

## 2019-02-21 DIAGNOSIS — I480 Paroxysmal atrial fibrillation: Secondary | ICD-10-CM | POA: Diagnosis not present

## 2019-02-21 DIAGNOSIS — J984 Other disorders of lung: Secondary | ICD-10-CM | POA: Diagnosis not present

## 2019-02-21 DIAGNOSIS — D538 Other specified nutritional anemias: Secondary | ICD-10-CM | POA: Diagnosis not present

## 2019-02-21 DIAGNOSIS — I495 Sick sinus syndrome: Secondary | ICD-10-CM | POA: Diagnosis not present

## 2019-02-21 DIAGNOSIS — E038 Other specified hypothyroidism: Secondary | ICD-10-CM | POA: Diagnosis not present

## 2019-02-21 DIAGNOSIS — I5032 Chronic diastolic (congestive) heart failure: Secondary | ICD-10-CM | POA: Diagnosis not present

## 2019-02-21 DIAGNOSIS — I6389 Other cerebral infarction: Secondary | ICD-10-CM | POA: Diagnosis not present

## 2019-02-27 ENCOUNTER — Encounter: Payer: Self-pay | Admitting: Family

## 2019-02-27 ENCOUNTER — Inpatient Hospital Stay (HOSPITAL_COMMUNITY): Admission: RE | Admit: 2019-02-27 | Payer: Medicare HMO | Source: Ambulatory Visit

## 2019-02-27 ENCOUNTER — Encounter: Payer: Medicare HMO | Admitting: Surgery

## 2019-02-27 DIAGNOSIS — R5381 Other malaise: Secondary | ICD-10-CM | POA: Diagnosis not present

## 2019-02-27 DIAGNOSIS — G4700 Insomnia, unspecified: Secondary | ICD-10-CM | POA: Diagnosis not present

## 2019-02-28 DIAGNOSIS — K9289 Other specified diseases of the digestive system: Secondary | ICD-10-CM | POA: Diagnosis not present

## 2019-02-28 DIAGNOSIS — I5032 Chronic diastolic (congestive) heart failure: Secondary | ICD-10-CM | POA: Diagnosis not present

## 2019-02-28 DIAGNOSIS — J984 Other disorders of lung: Secondary | ICD-10-CM | POA: Diagnosis not present

## 2019-02-28 DIAGNOSIS — I480 Paroxysmal atrial fibrillation: Secondary | ICD-10-CM | POA: Diagnosis not present

## 2019-02-28 DIAGNOSIS — R0602 Shortness of breath: Secondary | ICD-10-CM | POA: Diagnosis not present

## 2019-02-28 DIAGNOSIS — R0902 Hypoxemia: Secondary | ICD-10-CM | POA: Diagnosis not present

## 2019-02-28 DIAGNOSIS — R05 Cough: Secondary | ICD-10-CM | POA: Diagnosis not present

## 2019-03-06 DIAGNOSIS — R5381 Other malaise: Secondary | ICD-10-CM | POA: Diagnosis not present

## 2019-03-06 DIAGNOSIS — G4700 Insomnia, unspecified: Secondary | ICD-10-CM | POA: Diagnosis not present

## 2019-03-06 DIAGNOSIS — B349 Viral infection, unspecified: Secondary | ICD-10-CM | POA: Diagnosis not present

## 2019-03-07 DIAGNOSIS — J984 Other disorders of lung: Secondary | ICD-10-CM | POA: Diagnosis not present

## 2019-03-07 DIAGNOSIS — G6289 Other specified polyneuropathies: Secondary | ICD-10-CM | POA: Diagnosis not present

## 2019-03-07 DIAGNOSIS — J188 Other pneumonia, unspecified organism: Secondary | ICD-10-CM | POA: Diagnosis not present

## 2019-03-07 DIAGNOSIS — I5040 Unspecified combined systolic (congestive) and diastolic (congestive) heart failure: Secondary | ICD-10-CM | POA: Diagnosis not present

## 2019-03-16 DIAGNOSIS — N289 Disorder of kidney and ureter, unspecified: Secondary | ICD-10-CM | POA: Diagnosis not present

## 2019-03-16 DIAGNOSIS — Z03818 Encounter for observation for suspected exposure to other biological agents ruled out: Secondary | ICD-10-CM | POA: Diagnosis not present

## 2019-03-16 DIAGNOSIS — I5032 Chronic diastolic (congestive) heart failure: Secondary | ICD-10-CM | POA: Diagnosis not present

## 2019-03-16 DIAGNOSIS — D6949 Other primary thrombocytopenia: Secondary | ICD-10-CM | POA: Diagnosis not present

## 2019-03-16 DIAGNOSIS — I480 Paroxysmal atrial fibrillation: Secondary | ICD-10-CM | POA: Diagnosis not present

## 2019-03-16 DIAGNOSIS — D6489 Other specified anemias: Secondary | ICD-10-CM | POA: Diagnosis not present

## 2019-03-16 DIAGNOSIS — I495 Sick sinus syndrome: Secondary | ICD-10-CM | POA: Diagnosis not present

## 2019-03-16 DIAGNOSIS — E038 Other specified hypothyroidism: Secondary | ICD-10-CM | POA: Diagnosis not present

## 2019-03-20 DIAGNOSIS — E039 Hypothyroidism, unspecified: Secondary | ICD-10-CM | POA: Diagnosis not present

## 2019-03-20 DIAGNOSIS — G4700 Insomnia, unspecified: Secondary | ICD-10-CM | POA: Diagnosis not present

## 2019-03-20 DIAGNOSIS — R5381 Other malaise: Secondary | ICD-10-CM | POA: Diagnosis not present

## 2019-03-20 DIAGNOSIS — Z79899 Other long term (current) drug therapy: Secondary | ICD-10-CM | POA: Diagnosis not present

## 2019-03-22 DIAGNOSIS — I10 Essential (primary) hypertension: Secondary | ICD-10-CM | POA: Diagnosis not present

## 2019-03-22 DIAGNOSIS — N289 Disorder of kidney and ureter, unspecified: Secondary | ICD-10-CM | POA: Diagnosis not present

## 2019-03-22 DIAGNOSIS — I5041 Acute combined systolic (congestive) and diastolic (congestive) heart failure: Secondary | ICD-10-CM | POA: Diagnosis not present

## 2019-03-22 DIAGNOSIS — J984 Other disorders of lung: Secondary | ICD-10-CM | POA: Diagnosis not present

## 2019-03-27 DIAGNOSIS — I480 Paroxysmal atrial fibrillation: Secondary | ICD-10-CM | POA: Diagnosis not present

## 2019-03-27 DIAGNOSIS — J438 Other emphysema: Secondary | ICD-10-CM | POA: Diagnosis not present

## 2019-03-27 DIAGNOSIS — J188 Other pneumonia, unspecified organism: Secondary | ICD-10-CM | POA: Diagnosis not present

## 2019-03-27 DIAGNOSIS — D638 Anemia in other chronic diseases classified elsewhere: Secondary | ICD-10-CM | POA: Diagnosis not present

## 2019-03-27 DIAGNOSIS — I5041 Acute combined systolic (congestive) and diastolic (congestive) heart failure: Secondary | ICD-10-CM | POA: Diagnosis not present

## 2019-03-28 DIAGNOSIS — J189 Pneumonia, unspecified organism: Secondary | ICD-10-CM | POA: Diagnosis not present

## 2019-03-28 DIAGNOSIS — R0602 Shortness of breath: Secondary | ICD-10-CM | POA: Diagnosis not present

## 2019-03-28 DIAGNOSIS — J918 Pleural effusion in other conditions classified elsewhere: Secondary | ICD-10-CM | POA: Diagnosis not present

## 2019-03-28 DIAGNOSIS — Z09 Encounter for follow-up examination after completed treatment for conditions other than malignant neoplasm: Secondary | ICD-10-CM | POA: Diagnosis not present

## 2019-03-30 DIAGNOSIS — R0989 Other specified symptoms and signs involving the circulatory and respiratory systems: Secondary | ICD-10-CM | POA: Diagnosis not present

## 2019-03-30 DIAGNOSIS — R0602 Shortness of breath: Secondary | ICD-10-CM | POA: Diagnosis not present

## 2019-03-30 DIAGNOSIS — R471 Dysarthria and anarthria: Secondary | ICD-10-CM | POA: Diagnosis not present

## 2019-03-30 DIAGNOSIS — R1312 Dysphagia, oropharyngeal phase: Secondary | ICD-10-CM | POA: Diagnosis not present

## 2019-03-30 DIAGNOSIS — R2681 Unsteadiness on feet: Secondary | ICD-10-CM | POA: Diagnosis not present

## 2019-03-30 DIAGNOSIS — R278 Other lack of coordination: Secondary | ICD-10-CM | POA: Diagnosis not present

## 2019-03-30 DIAGNOSIS — R41841 Cognitive communication deficit: Secondary | ICD-10-CM | POA: Diagnosis not present

## 2019-03-30 DIAGNOSIS — M6281 Muscle weakness (generalized): Secondary | ICD-10-CM | POA: Diagnosis not present

## 2019-03-30 DIAGNOSIS — R2689 Other abnormalities of gait and mobility: Secondary | ICD-10-CM | POA: Diagnosis not present

## 2019-03-30 DIAGNOSIS — U071 COVID-19: Secondary | ICD-10-CM | POA: Diagnosis not present

## 2019-03-31 DIAGNOSIS — R0602 Shortness of breath: Secondary | ICD-10-CM | POA: Diagnosis not present

## 2019-03-31 DIAGNOSIS — D538 Other specified nutritional anemias: Secondary | ICD-10-CM | POA: Diagnosis not present

## 2019-03-31 DIAGNOSIS — U071 COVID-19: Secondary | ICD-10-CM | POA: Diagnosis not present

## 2019-03-31 DIAGNOSIS — R2681 Unsteadiness on feet: Secondary | ICD-10-CM | POA: Diagnosis not present

## 2019-03-31 DIAGNOSIS — R278 Other lack of coordination: Secondary | ICD-10-CM | POA: Diagnosis not present

## 2019-03-31 DIAGNOSIS — R1312 Dysphagia, oropharyngeal phase: Secondary | ICD-10-CM | POA: Diagnosis not present

## 2019-03-31 DIAGNOSIS — M6281 Muscle weakness (generalized): Secondary | ICD-10-CM | POA: Diagnosis not present

## 2019-03-31 DIAGNOSIS — R2689 Other abnormalities of gait and mobility: Secondary | ICD-10-CM | POA: Diagnosis not present

## 2019-03-31 DIAGNOSIS — L8995 Pressure ulcer of unspecified site, unstageable: Secondary | ICD-10-CM | POA: Diagnosis not present

## 2019-03-31 DIAGNOSIS — R471 Dysarthria and anarthria: Secondary | ICD-10-CM | POA: Diagnosis not present

## 2019-03-31 DIAGNOSIS — J984 Other disorders of lung: Secondary | ICD-10-CM | POA: Diagnosis not present

## 2019-03-31 DIAGNOSIS — R41841 Cognitive communication deficit: Secondary | ICD-10-CM | POA: Diagnosis not present

## 2019-03-31 DIAGNOSIS — L98429 Non-pressure chronic ulcer of back with unspecified severity: Secondary | ICD-10-CM | POA: Diagnosis not present

## 2019-03-31 DIAGNOSIS — R0989 Other specified symptoms and signs involving the circulatory and respiratory systems: Secondary | ICD-10-CM | POA: Diagnosis not present

## 2019-04-02 NOTE — Progress Notes (Deleted)
{Choose 1 Note Type (Telehealth Visit or Telephone Visit):[id number]}   Date:  04/02/2019   ID:  John Peck, DOB December 14, 1923, MRN [id number]  {Patient Location:[phone]::"Home"} {Provider [phone]::"Home"}  PCP:  No primary care provider on file.  Cardiologist:  Shelva Majestic, MD  Electrophysiologist:  Cristopher Peru, MD   Evaluation Performed:  {Choose Visit LTJQ:[id number]::"MAUQJF-HL Visit"}  Chief Complaint:    History of Present Illness:    John Peck is a 85 y.o. male with with known history of coronary artery disease, (status post CABG in 1989, DES to RCA in 2017, DES to proximal left circumflex in 02/2017), paroxysmal atrial fibrillation but not on anticoagulation as he has a history of a GI bleed, Saint Jude pacemaker in situ in the setting of sick sinus syndrome, hypertension, chronic left bundle branch block, HL, CVA, chronic combined systolic diastolic heart failure, severe right carotid artery disease.   Other history of chronic kidney disease stage III, hypothyroidism, dementia, pleural effusion status post thoracentesis on 05/2018, iron deficiency anemia with pancytopenia of unknown cause, prior liver abscess and COPD.  He has had several admissions for decompensated CHF.  He is currently a resident of a skilled nursing facility and is followed by their physician.  He was last seen in the office on 01/04/2019, and accompanied by his daughter who had multiple questions.  There were no medication changes made at that office visit nor were there any further testing plans made.  The patient was admitted again to the hospital in April 2020 in the setting of a GI bleed, he received 2 blood transfusions of packed red blood cells.  He was found to have GI diverticulosis versus hematochezia hemorrhoidal in etiology.  He was not found to be a candidate for colonoscopy given his advanced age and lack of benefit concerning mortality.  His baseline hemoglobin was reported to  be 8.  He was noted to be in regular rhythm during that hospitalization.  The patient was taken off of Plavix as it was not found to be beneficial for him, with risks outweighing benefits.  The patient {does/does not:200015} have symptoms concerning for COVID-19 infection (fever, chills, cough, or new shortness of breath).    Past Medical History:  Diagnosis Date  . Anemia   . Arthritis   . CAD (coronary artery disease)    a. CABG '89; b. PCI '04; c. 11/2015 Cath/PCI: LM 20ost, LAD 125m, LCX 30p, OM2 40, RCA 30p, 10p ISR, 90d (3.0x12 Synergy DES), AM 90, VG->OM2 known to be 100, LIMA->LAD not injected, patent in 2015.  . Carotid stenosis, right   . Chronic combined systolic and diastolic CHF (congestive heart failure) ([place])   . CKD (chronic kidney disease), stage III ([place])   . Dyspnea   . H/O: GI bleed    REMOTE HISTORY  . History of COPD   . Hyperlipidemia   . Hypertensive heart disease   . LBBB (left bundle branch block)   . Pacemaker Sept 2009   St Jude  . PAF (paroxysmal atrial fibrillation) ([facility])    a. not on anticoag due to prior GIB.  . Sick sinus syndrome [facility]) Sept 2009   ST Jude PTVDP  . Stroke [facility])    Past Surgical History:  Procedure Laterality Date  . CARDIAC CATHETERIZATION  11/2011   EF 50%; significant native CAD w/80% stenosis of the LAD after 2nd giagonal vessel and septal perforating artery w/total occlusion of the mid left anterior descendiung.; 60-70% ostiasl stenosis in the circumflex  vessel followed by 40% proximal stenosis and 70-80% distal circumflex stenosis;   . CARDIAC CATHETERIZATION N/A 11/29/2015   Procedure: Left Heart Cath and Cors/Grafts Angiography;  Surgeon: Burnell Blanks, MD;  Location: [place] [facility];  Service: Cardiovascular;  Laterality: N/A;  . CARDIAC CATHETERIZATION N/A 11/29/2015   Procedure: Coronary Stent Intervention;  Surgeon: Burnell Blanks, MD;  Location: [place] [facility];  Service: Cardiovascular;  Laterality:  N/A;  . CATARACT EXTRACTION  2004  . CORONARY ANGIOGRAPHY N/A 02/25/2017   Procedure: Coronary Angiography;  Surgeon: Belva Crome, MD;  Location: [place] [facility];  Service: Cardiovascular;  Laterality: N/A;  . CORONARY ANGIOPLASTY WITH STENT PLACEMENT  2004   RCA  . CORONARY ARTERY BYPASS GRAFT  1989   had LIMA to his LAD, a vein to the circumflex. In 2004 underwent stenting to his right coronary artery.  . CORONARY STENT INTERVENTION N/A 02/24/2017   Procedure: Coronary Stent Intervention;  Surgeon: Wellington Hampshire, MD;  Location: [place] [facility];  Service: Cardiovascular;  Laterality: N/A;  cfx  . HEMORRHOID SURGERY    . INSERT / REPLACE / REMOVE PACEMAKER  07/17/08   DUAL-CHAMBER; PPM-ST.JUDE MEDNET  . IR THORACENTESIS ASP PLEURAL SPACE W/IMG GUIDE  05/25/2018  . IR THORACENTESIS ASP PLEURAL SPACE W/IMG GUIDE  08/01/2018  . LEFT HEART CATH AND CORS/GRAFTS ANGIOGRAPHY N/A 02/24/2017   Procedure: Left Heart Cath and Cors/Grafts Angiography;  Surgeon: Wellington Hampshire, MD;  Location: [place] [facility];  Service: Cardiovascular;  Laterality: N/A;  . LEFT HEART CATHETERIZATION WITH CORONARY/GRAFT ANGIOGRAM N/A 12/10/2011   Procedure: LEFT HEART CATHETERIZATION WITH Beatrix Fetters;  Surgeon: Troy Sine, MD;  Location: [facility] CATH LAB;  Service: Cardiovascular;  Laterality: N/A;  . LEFT HEART CATHETERIZATION WITH CORONARY/GRAFT ANGIOGRAM N/A 01/26/2014   Procedure: LEFT HEART CATHETERIZATION WITH Beatrix Fetters;  Surgeon: Troy Sine, MD;  Location: [facility] CATH LAB;  Service: Cardiovascular;  Laterality: N/A;  . PPM GENERATOR CHANGEOUT N/A 12/15/2017   Procedure: PPM GENERATOR CHANGEOUT;  Surgeon: Evans Lance, MD;  Location: [place] [facility];  Service: Cardiovascular;  Laterality: N/A;  . PROSTATECTOMY       No outpatient medications have been marked as taking for the 04/04/19 encounter (Appointment) with Lendon Colonel, NP.     Allergies:   Patient has no known  allergies.   Social History   Tobacco Use  . Smoking status: Former Smoker    Packs/day: 0.75    Years: 65.00    Pack years: 48.75    Types: Cigarettes    Last attempt to quit: 11/09/1997    Years since quitting: 21.4  . Smokeless tobacco: Former Systems developer    Quit date: 10/29/2008  Substance Use Topics  . Alcohol use: No  . Drug use: No     Family Hx: The patient's family history includes Diabetes in his father; Heart attack in his brother; Heart disease in his brother and sister; Stroke in his brother and sister. There is no history of Hypertension.  ROS:   Please see the history of present illness.    *** All other systems reviewed and are negative.   Prior CV studies:   The following studies were reviewed today:  ***  Labs/Other Tests and Data Reviewed:    EKG:  {EKG/Telemetry Strips Reviewed:951 [id number] 3407}  Recent Labs: 08/09/2018: B Natriuretic Peptide 1,084.8 11/21/2018: TSH 1.859 11/29/2018: Magnesium 1.9 02/11/2019: ALT 13 02/17/2019: BUN 24; Creatinine, Ser 1.75; Hemoglobin 7.3; Platelets  135; Potassium 5.2; Sodium 139   Recent Lipid Panel Lab Results  Component Value Date/Time   CHOL 91 11/22/2018 08:22 AM   CHOL 128 07/28/2017 08:13 AM   TRIG 78 11/22/2018 08:22 AM   HDL 26 (L) 11/22/2018 08:22 AM   HDL 59 07/28/2017 08:13 AM   CHOLHDL 3.5 11/22/2018 08:22 AM   LDLCALC 49 11/22/2018 08:22 AM   LDLCALC 54 07/28/2017 08:13 AM   LDLDIRECT 55 12/10/2011 08:06 AM    Wt Readings from Last 3 Encounters:  02/17/19 127 lb 6.4 oz (57.8 kg)  01/04/19 129 lb 3.2 oz (58.6 kg)  11/29/18 160 lb (72.6 kg)     Objective:    Vital Signs:  There were no vitals taken for this visit.   {[facility] Virtual Exam (Optional):[phone]::"VITAL SIGNS:  reviewed"}  ASSESSMENT & PLAN:    1. ***  COVID-19 Education: The signs and symptoms of COVID-19 were discussed with the patient and how to seek care for testing (follow up with PCP or arrange E-visit).  ***The importance of  social distancing was discussed today.  Time:   Today, I have spent *** minutes with the patient with telehealth technology discussing the above problems.     Medication Adjustments/Labs and Tests Ordered: Current medicines are reviewed at length with the patient today.  Concerns regarding medicines are outlined above.   Tests Ordered: No orders of the defined types were placed in this encounter.   Medication Changes: No orders of the defined types were placed in this encounter.   Disposition:  Follow up {follow up:15908}  Signed, Phill Myron. West Pugh, ANP, AACC  04/02/2019 5:44 PM    [facility]

## 2019-04-03 DIAGNOSIS — U071 COVID-19: Secondary | ICD-10-CM | POA: Diagnosis not present

## 2019-04-03 DIAGNOSIS — R471 Dysarthria and anarthria: Secondary | ICD-10-CM | POA: Diagnosis not present

## 2019-04-03 DIAGNOSIS — M6281 Muscle weakness (generalized): Secondary | ICD-10-CM | POA: Diagnosis not present

## 2019-04-03 DIAGNOSIS — R1312 Dysphagia, oropharyngeal phase: Secondary | ICD-10-CM | POA: Diagnosis not present

## 2019-04-03 DIAGNOSIS — Z79899 Other long term (current) drug therapy: Secondary | ICD-10-CM | POA: Diagnosis not present

## 2019-04-03 DIAGNOSIS — R278 Other lack of coordination: Secondary | ICD-10-CM | POA: Diagnosis not present

## 2019-04-03 DIAGNOSIS — R2689 Other abnormalities of gait and mobility: Secondary | ICD-10-CM | POA: Diagnosis not present

## 2019-04-03 DIAGNOSIS — R2681 Unsteadiness on feet: Secondary | ICD-10-CM | POA: Diagnosis not present

## 2019-04-03 DIAGNOSIS — R41841 Cognitive communication deficit: Secondary | ICD-10-CM | POA: Diagnosis not present

## 2019-04-03 DIAGNOSIS — R0602 Shortness of breath: Secondary | ICD-10-CM | POA: Diagnosis not present

## 2019-04-03 DIAGNOSIS — R0989 Other specified symptoms and signs involving the circulatory and respiratory systems: Secondary | ICD-10-CM | POA: Diagnosis not present

## 2019-04-04 ENCOUNTER — Telehealth: Payer: Self-pay

## 2019-04-04 ENCOUNTER — Ambulatory Visit: Payer: Medicare HMO | Admitting: Adult Health

## 2019-04-04 DIAGNOSIS — R278 Other lack of coordination: Secondary | ICD-10-CM | POA: Diagnosis not present

## 2019-04-04 DIAGNOSIS — R2689 Other abnormalities of gait and mobility: Secondary | ICD-10-CM | POA: Diagnosis not present

## 2019-04-04 DIAGNOSIS — R41841 Cognitive communication deficit: Secondary | ICD-10-CM | POA: Diagnosis not present

## 2019-04-04 DIAGNOSIS — U071 COVID-19: Secondary | ICD-10-CM | POA: Diagnosis not present

## 2019-04-04 DIAGNOSIS — R2681 Unsteadiness on feet: Secondary | ICD-10-CM | POA: Diagnosis not present

## 2019-04-04 DIAGNOSIS — R0989 Other specified symptoms and signs involving the circulatory and respiratory systems: Secondary | ICD-10-CM | POA: Diagnosis not present

## 2019-04-04 DIAGNOSIS — M6281 Muscle weakness (generalized): Secondary | ICD-10-CM | POA: Diagnosis not present

## 2019-04-04 DIAGNOSIS — G4700 Insomnia, unspecified: Secondary | ICD-10-CM | POA: Diagnosis not present

## 2019-04-04 DIAGNOSIS — R471 Dysarthria and anarthria: Secondary | ICD-10-CM | POA: Diagnosis not present

## 2019-04-04 DIAGNOSIS — R0602 Shortness of breath: Secondary | ICD-10-CM | POA: Diagnosis not present

## 2019-04-04 DIAGNOSIS — R5381 Other malaise: Secondary | ICD-10-CM | POA: Diagnosis not present

## 2019-04-04 DIAGNOSIS — R1312 Dysphagia, oropharyngeal phase: Secondary | ICD-10-CM | POA: Diagnosis not present

## 2019-04-04 NOTE — Telephone Encounter (Signed)
S/w daughter-Ellen she states that pt is at [facility] and they are not letting family visit since March. She states that there is Dr Keenan Bachelor and some NP making rounds there at the facility.

## 2019-04-04 NOTE — Telephone Encounter (Signed)
Spoke with daughter Ellen/KL we will cancel appt d/t pt's declining health. Daughter will call back and schedule an appt when needed

## 2019-04-04 NOTE — Telephone Encounter (Signed)
Follow up ° ° °Patient's daughter is returning your call. Please call. °

## 2019-04-04 NOTE — Telephone Encounter (Signed)
LM2Cb for daughter, pt has a virtual 3 mo f/u scheduled pt is in nursing home, is visit needed?

## 2019-04-05 DIAGNOSIS — R41841 Cognitive communication deficit: Secondary | ICD-10-CM | POA: Diagnosis not present

## 2019-04-05 DIAGNOSIS — M6281 Muscle weakness (generalized): Secondary | ICD-10-CM | POA: Diagnosis not present

## 2019-04-05 DIAGNOSIS — R2689 Other abnormalities of gait and mobility: Secondary | ICD-10-CM | POA: Diagnosis not present

## 2019-04-05 DIAGNOSIS — R0989 Other specified symptoms and signs involving the circulatory and respiratory systems: Secondary | ICD-10-CM | POA: Diagnosis not present

## 2019-04-05 DIAGNOSIS — R278 Other lack of coordination: Secondary | ICD-10-CM | POA: Diagnosis not present

## 2019-04-05 DIAGNOSIS — R2681 Unsteadiness on feet: Secondary | ICD-10-CM | POA: Diagnosis not present

## 2019-04-05 DIAGNOSIS — R0602 Shortness of breath: Secondary | ICD-10-CM | POA: Diagnosis not present

## 2019-04-05 DIAGNOSIS — R1312 Dysphagia, oropharyngeal phase: Secondary | ICD-10-CM | POA: Diagnosis not present

## 2019-04-05 DIAGNOSIS — R471 Dysarthria and anarthria: Secondary | ICD-10-CM | POA: Diagnosis not present

## 2019-04-05 DIAGNOSIS — U071 COVID-19: Secondary | ICD-10-CM | POA: Diagnosis not present

## 2019-04-06 DIAGNOSIS — R278 Other lack of coordination: Secondary | ICD-10-CM | POA: Diagnosis not present

## 2019-04-06 DIAGNOSIS — R2689 Other abnormalities of gait and mobility: Secondary | ICD-10-CM | POA: Diagnosis not present

## 2019-04-06 DIAGNOSIS — R471 Dysarthria and anarthria: Secondary | ICD-10-CM | POA: Diagnosis not present

## 2019-04-06 DIAGNOSIS — R2681 Unsteadiness on feet: Secondary | ICD-10-CM | POA: Diagnosis not present

## 2019-04-06 DIAGNOSIS — R0602 Shortness of breath: Secondary | ICD-10-CM | POA: Diagnosis not present

## 2019-04-06 DIAGNOSIS — R41841 Cognitive communication deficit: Secondary | ICD-10-CM | POA: Diagnosis not present

## 2019-04-06 DIAGNOSIS — R0989 Other specified symptoms and signs involving the circulatory and respiratory systems: Secondary | ICD-10-CM | POA: Diagnosis not present

## 2019-04-06 DIAGNOSIS — M6281 Muscle weakness (generalized): Secondary | ICD-10-CM | POA: Diagnosis not present

## 2019-04-06 DIAGNOSIS — R1312 Dysphagia, oropharyngeal phase: Secondary | ICD-10-CM | POA: Diagnosis not present

## 2019-04-06 DIAGNOSIS — U071 COVID-19: Secondary | ICD-10-CM | POA: Diagnosis not present

## 2019-04-07 DIAGNOSIS — R41841 Cognitive communication deficit: Secondary | ICD-10-CM | POA: Diagnosis not present

## 2019-04-07 DIAGNOSIS — B349 Viral infection, unspecified: Secondary | ICD-10-CM | POA: Diagnosis not present

## 2019-04-07 DIAGNOSIS — U071 COVID-19: Secondary | ICD-10-CM | POA: Diagnosis not present

## 2019-04-07 DIAGNOSIS — R0602 Shortness of breath: Secondary | ICD-10-CM | POA: Diagnosis not present

## 2019-04-07 DIAGNOSIS — R0989 Other specified symptoms and signs involving the circulatory and respiratory systems: Secondary | ICD-10-CM | POA: Diagnosis not present

## 2019-04-07 DIAGNOSIS — M6281 Muscle weakness (generalized): Secondary | ICD-10-CM | POA: Diagnosis not present

## 2019-04-07 DIAGNOSIS — R2689 Other abnormalities of gait and mobility: Secondary | ICD-10-CM | POA: Diagnosis not present

## 2019-04-07 DIAGNOSIS — R1312 Dysphagia, oropharyngeal phase: Secondary | ICD-10-CM | POA: Diagnosis not present

## 2019-04-07 DIAGNOSIS — R471 Dysarthria and anarthria: Secondary | ICD-10-CM | POA: Diagnosis not present

## 2019-04-07 DIAGNOSIS — R278 Other lack of coordination: Secondary | ICD-10-CM | POA: Diagnosis not present

## 2019-04-07 DIAGNOSIS — R2681 Unsteadiness on feet: Secondary | ICD-10-CM | POA: Diagnosis not present

## 2019-04-08 DIAGNOSIS — U071 COVID-19: Secondary | ICD-10-CM | POA: Diagnosis not present

## 2019-04-08 DIAGNOSIS — R0602 Shortness of breath: Secondary | ICD-10-CM | POA: Diagnosis not present

## 2019-04-08 DIAGNOSIS — R2689 Other abnormalities of gait and mobility: Secondary | ICD-10-CM | POA: Diagnosis not present

## 2019-04-08 DIAGNOSIS — R2681 Unsteadiness on feet: Secondary | ICD-10-CM | POA: Diagnosis not present

## 2019-04-08 DIAGNOSIS — R41841 Cognitive communication deficit: Secondary | ICD-10-CM | POA: Diagnosis not present

## 2019-04-08 DIAGNOSIS — M6281 Muscle weakness (generalized): Secondary | ICD-10-CM | POA: Diagnosis not present

## 2019-04-08 DIAGNOSIS — R278 Other lack of coordination: Secondary | ICD-10-CM | POA: Diagnosis not present

## 2019-04-08 DIAGNOSIS — R1312 Dysphagia, oropharyngeal phase: Secondary | ICD-10-CM | POA: Diagnosis not present

## 2019-04-08 DIAGNOSIS — R471 Dysarthria and anarthria: Secondary | ICD-10-CM | POA: Diagnosis not present

## 2019-04-08 DIAGNOSIS — R0989 Other specified symptoms and signs involving the circulatory and respiratory systems: Secondary | ICD-10-CM | POA: Diagnosis not present

## 2019-04-09 DIAGNOSIS — U071 COVID-19: Secondary | ICD-10-CM | POA: Diagnosis not present

## 2019-04-09 DIAGNOSIS — R0989 Other specified symptoms and signs involving the circulatory and respiratory systems: Secondary | ICD-10-CM | POA: Diagnosis not present

## 2019-04-09 DIAGNOSIS — R471 Dysarthria and anarthria: Secondary | ICD-10-CM | POA: Diagnosis not present

## 2019-04-09 DIAGNOSIS — R0602 Shortness of breath: Secondary | ICD-10-CM | POA: Diagnosis not present

## 2019-04-09 DIAGNOSIS — R2681 Unsteadiness on feet: Secondary | ICD-10-CM | POA: Diagnosis not present

## 2019-04-09 DIAGNOSIS — R2689 Other abnormalities of gait and mobility: Secondary | ICD-10-CM | POA: Diagnosis not present

## 2019-04-09 DIAGNOSIS — R41841 Cognitive communication deficit: Secondary | ICD-10-CM | POA: Diagnosis not present

## 2019-04-09 DIAGNOSIS — R1312 Dysphagia, oropharyngeal phase: Secondary | ICD-10-CM | POA: Diagnosis not present

## 2019-04-09 DIAGNOSIS — M6281 Muscle weakness (generalized): Secondary | ICD-10-CM | POA: Diagnosis not present

## 2019-04-09 DIAGNOSIS — R278 Other lack of coordination: Secondary | ICD-10-CM | POA: Diagnosis not present

## 2019-04-10 DIAGNOSIS — L89152 Pressure ulcer of sacral region, stage 2: Secondary | ICD-10-CM | POA: Diagnosis not present

## 2019-04-10 DIAGNOSIS — R0602 Shortness of breath: Secondary | ICD-10-CM | POA: Diagnosis not present

## 2019-04-10 DIAGNOSIS — R2689 Other abnormalities of gait and mobility: Secondary | ICD-10-CM | POA: Diagnosis not present

## 2019-04-10 DIAGNOSIS — R2681 Unsteadiness on feet: Secondary | ICD-10-CM | POA: Diagnosis not present

## 2019-04-10 DIAGNOSIS — R471 Dysarthria and anarthria: Secondary | ICD-10-CM | POA: Diagnosis not present

## 2019-04-10 DIAGNOSIS — R41841 Cognitive communication deficit: Secondary | ICD-10-CM | POA: Diagnosis not present

## 2019-04-10 DIAGNOSIS — M6281 Muscle weakness (generalized): Secondary | ICD-10-CM | POA: Diagnosis not present

## 2019-04-10 DIAGNOSIS — R278 Other lack of coordination: Secondary | ICD-10-CM | POA: Diagnosis not present

## 2019-04-10 DIAGNOSIS — R1312 Dysphagia, oropharyngeal phase: Secondary | ICD-10-CM | POA: Diagnosis not present

## 2019-04-10 DIAGNOSIS — U071 COVID-19: Secondary | ICD-10-CM | POA: Diagnosis not present

## 2019-04-10 DIAGNOSIS — R0989 Other specified symptoms and signs involving the circulatory and respiratory systems: Secondary | ICD-10-CM | POA: Diagnosis not present

## 2019-04-10 DIAGNOSIS — L89890 Pressure ulcer of other site, unstageable: Secondary | ICD-10-CM | POA: Diagnosis not present

## 2019-04-11 DIAGNOSIS — B354 Tinea corporis: Secondary | ICD-10-CM | POA: Diagnosis not present

## 2019-04-11 DIAGNOSIS — R41841 Cognitive communication deficit: Secondary | ICD-10-CM | POA: Diagnosis not present

## 2019-04-11 DIAGNOSIS — R2681 Unsteadiness on feet: Secondary | ICD-10-CM | POA: Diagnosis not present

## 2019-04-11 DIAGNOSIS — M6281 Muscle weakness (generalized): Secondary | ICD-10-CM | POA: Diagnosis not present

## 2019-04-11 DIAGNOSIS — D638 Anemia in other chronic diseases classified elsewhere: Secondary | ICD-10-CM | POA: Diagnosis not present

## 2019-04-11 DIAGNOSIS — R2689 Other abnormalities of gait and mobility: Secondary | ICD-10-CM | POA: Diagnosis not present

## 2019-04-11 DIAGNOSIS — U071 COVID-19: Secondary | ICD-10-CM | POA: Diagnosis not present

## 2019-04-11 DIAGNOSIS — R0989 Other specified symptoms and signs involving the circulatory and respiratory systems: Secondary | ICD-10-CM | POA: Diagnosis not present

## 2019-04-11 DIAGNOSIS — R1312 Dysphagia, oropharyngeal phase: Secondary | ICD-10-CM | POA: Diagnosis not present

## 2019-04-11 DIAGNOSIS — R278 Other lack of coordination: Secondary | ICD-10-CM | POA: Diagnosis not present

## 2019-04-11 DIAGNOSIS — R471 Dysarthria and anarthria: Secondary | ICD-10-CM | POA: Diagnosis not present

## 2019-04-11 DIAGNOSIS — L8995 Pressure ulcer of unspecified site, unstageable: Secondary | ICD-10-CM | POA: Diagnosis not present

## 2019-04-11 DIAGNOSIS — R0602 Shortness of breath: Secondary | ICD-10-CM | POA: Diagnosis not present

## 2019-04-11 DIAGNOSIS — J984 Other disorders of lung: Secondary | ICD-10-CM | POA: Diagnosis not present

## 2019-04-11 DIAGNOSIS — I10 Essential (primary) hypertension: Secondary | ICD-10-CM | POA: Diagnosis not present

## 2019-04-11 DIAGNOSIS — I5032 Chronic diastolic (congestive) heart failure: Secondary | ICD-10-CM | POA: Diagnosis not present

## 2019-04-12 DIAGNOSIS — U071 COVID-19: Secondary | ICD-10-CM | POA: Diagnosis not present

## 2019-04-12 DIAGNOSIS — R0602 Shortness of breath: Secondary | ICD-10-CM | POA: Diagnosis not present

## 2019-04-12 DIAGNOSIS — R471 Dysarthria and anarthria: Secondary | ICD-10-CM | POA: Diagnosis not present

## 2019-04-12 DIAGNOSIS — R41841 Cognitive communication deficit: Secondary | ICD-10-CM | POA: Diagnosis not present

## 2019-04-12 DIAGNOSIS — R1312 Dysphagia, oropharyngeal phase: Secondary | ICD-10-CM | POA: Diagnosis not present

## 2019-04-12 DIAGNOSIS — R2689 Other abnormalities of gait and mobility: Secondary | ICD-10-CM | POA: Diagnosis not present

## 2019-04-12 DIAGNOSIS — R0989 Other specified symptoms and signs involving the circulatory and respiratory systems: Secondary | ICD-10-CM | POA: Diagnosis not present

## 2019-04-12 DIAGNOSIS — R2681 Unsteadiness on feet: Secondary | ICD-10-CM | POA: Diagnosis not present

## 2019-04-12 DIAGNOSIS — R278 Other lack of coordination: Secondary | ICD-10-CM | POA: Diagnosis not present

## 2019-04-12 DIAGNOSIS — M6281 Muscle weakness (generalized): Secondary | ICD-10-CM | POA: Diagnosis not present

## 2019-04-13 DIAGNOSIS — R2689 Other abnormalities of gait and mobility: Secondary | ICD-10-CM | POA: Diagnosis not present

## 2019-04-13 DIAGNOSIS — R41841 Cognitive communication deficit: Secondary | ICD-10-CM | POA: Diagnosis not present

## 2019-04-13 DIAGNOSIS — U071 COVID-19: Secondary | ICD-10-CM | POA: Diagnosis not present

## 2019-04-13 DIAGNOSIS — R0989 Other specified symptoms and signs involving the circulatory and respiratory systems: Secondary | ICD-10-CM | POA: Diagnosis not present

## 2019-04-13 DIAGNOSIS — R471 Dysarthria and anarthria: Secondary | ICD-10-CM | POA: Diagnosis not present

## 2019-04-13 DIAGNOSIS — R278 Other lack of coordination: Secondary | ICD-10-CM | POA: Diagnosis not present

## 2019-04-13 DIAGNOSIS — R2681 Unsteadiness on feet: Secondary | ICD-10-CM | POA: Diagnosis not present

## 2019-04-13 DIAGNOSIS — R1312 Dysphagia, oropharyngeal phase: Secondary | ICD-10-CM | POA: Diagnosis not present

## 2019-04-13 DIAGNOSIS — R0602 Shortness of breath: Secondary | ICD-10-CM | POA: Diagnosis not present

## 2019-04-13 DIAGNOSIS — M6281 Muscle weakness (generalized): Secondary | ICD-10-CM | POA: Diagnosis not present

## 2019-04-14 DIAGNOSIS — R2681 Unsteadiness on feet: Secondary | ICD-10-CM | POA: Diagnosis not present

## 2019-04-14 DIAGNOSIS — R1312 Dysphagia, oropharyngeal phase: Secondary | ICD-10-CM | POA: Diagnosis not present

## 2019-04-14 DIAGNOSIS — R41841 Cognitive communication deficit: Secondary | ICD-10-CM | POA: Diagnosis not present

## 2019-04-14 DIAGNOSIS — U071 COVID-19: Secondary | ICD-10-CM | POA: Diagnosis not present

## 2019-04-14 DIAGNOSIS — R471 Dysarthria and anarthria: Secondary | ICD-10-CM | POA: Diagnosis not present

## 2019-04-14 DIAGNOSIS — M6281 Muscle weakness (generalized): Secondary | ICD-10-CM | POA: Diagnosis not present

## 2019-04-14 DIAGNOSIS — R278 Other lack of coordination: Secondary | ICD-10-CM | POA: Diagnosis not present

## 2019-04-14 DIAGNOSIS — R0989 Other specified symptoms and signs involving the circulatory and respiratory systems: Secondary | ICD-10-CM | POA: Diagnosis not present

## 2019-04-14 DIAGNOSIS — R2689 Other abnormalities of gait and mobility: Secondary | ICD-10-CM | POA: Diagnosis not present

## 2019-04-14 DIAGNOSIS — R0602 Shortness of breath: Secondary | ICD-10-CM | POA: Diagnosis not present

## 2019-04-15 DIAGNOSIS — R278 Other lack of coordination: Secondary | ICD-10-CM | POA: Diagnosis not present

## 2019-04-15 DIAGNOSIS — R2681 Unsteadiness on feet: Secondary | ICD-10-CM | POA: Diagnosis not present

## 2019-04-15 DIAGNOSIS — M6281 Muscle weakness (generalized): Secondary | ICD-10-CM | POA: Diagnosis not present

## 2019-04-15 DIAGNOSIS — R2689 Other abnormalities of gait and mobility: Secondary | ICD-10-CM | POA: Diagnosis not present

## 2019-04-15 DIAGNOSIS — U071 COVID-19: Secondary | ICD-10-CM | POA: Diagnosis not present

## 2019-04-15 DIAGNOSIS — R0989 Other specified symptoms and signs involving the circulatory and respiratory systems: Secondary | ICD-10-CM | POA: Diagnosis not present

## 2019-04-15 DIAGNOSIS — R471 Dysarthria and anarthria: Secondary | ICD-10-CM | POA: Diagnosis not present

## 2019-04-15 DIAGNOSIS — R0602 Shortness of breath: Secondary | ICD-10-CM | POA: Diagnosis not present

## 2019-04-15 DIAGNOSIS — R41841 Cognitive communication deficit: Secondary | ICD-10-CM | POA: Diagnosis not present

## 2019-04-15 DIAGNOSIS — R1312 Dysphagia, oropharyngeal phase: Secondary | ICD-10-CM | POA: Diagnosis not present

## 2019-05-02 ENCOUNTER — Encounter: Payer: Medicare HMO | Admitting: *Deleted

## 2019-05-03 ENCOUNTER — Telehealth: Payer: Self-pay

## 2019-05-03 NOTE — Telephone Encounter (Signed)
Left message for patient to remind of missed remote transmission.  

## 2019-05-10 DEATH — deceased

## 2019-12-03 IMAGING — US US RENAL
1 series · 14 of 24 positions shown · non-contrast
Comparison: Limited CT images through the upper abdomen from
07/17/2006

CLINICAL DATA: Hematuria

EXAM:
RENAL / URINARY TRACT ULTRASOUND COMPLETE

[Series 1: us renal · 0.23mm/px · 14 of 24 slices shown]
[im 1/24]
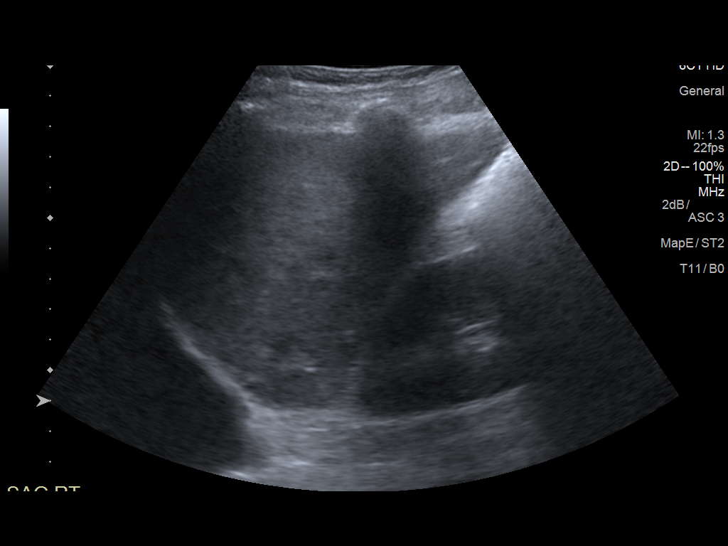
[im 3/24]
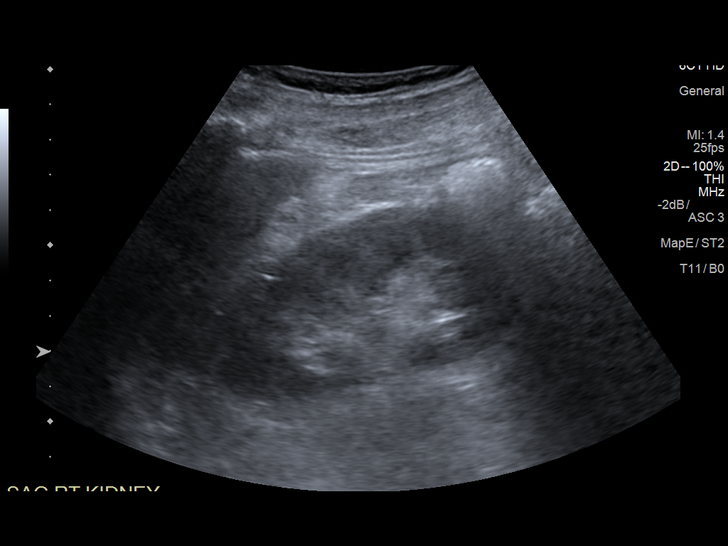
[im 5/24]
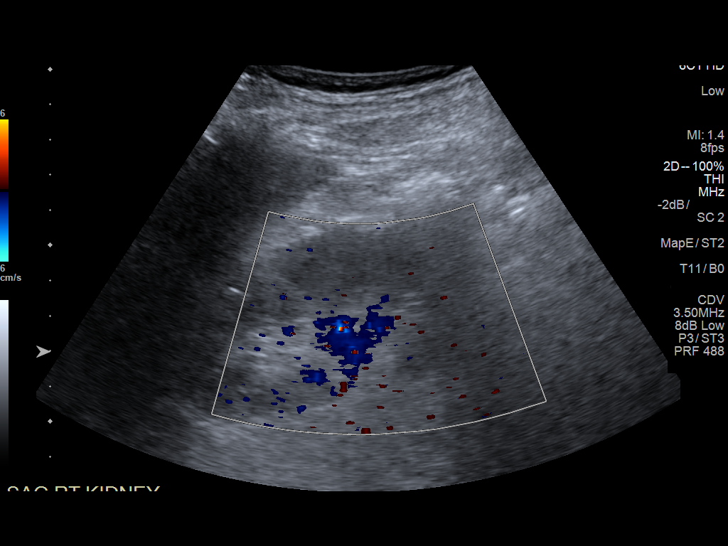
[im 7/24]
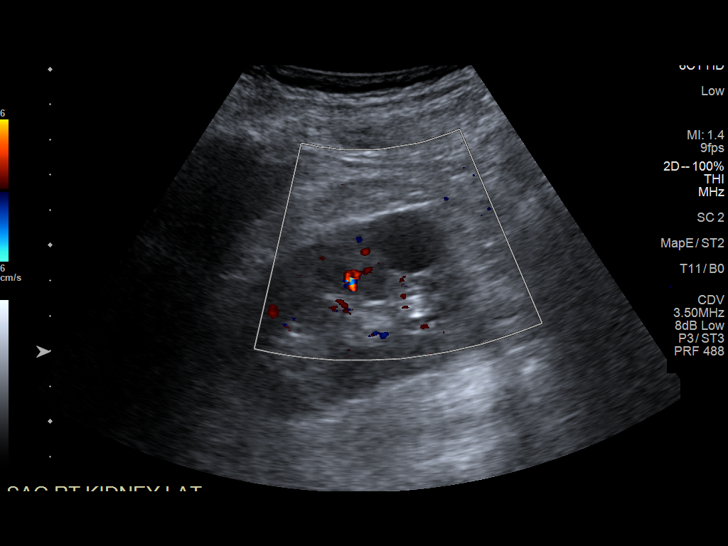
[im 8/24]
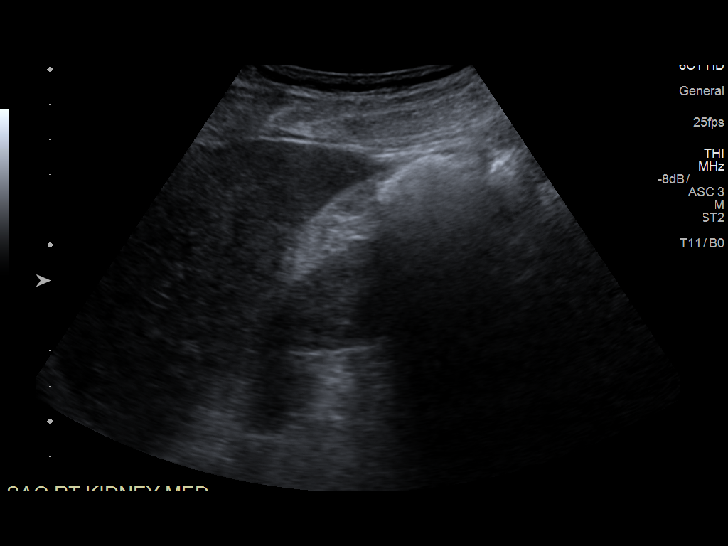
[im 10/24]
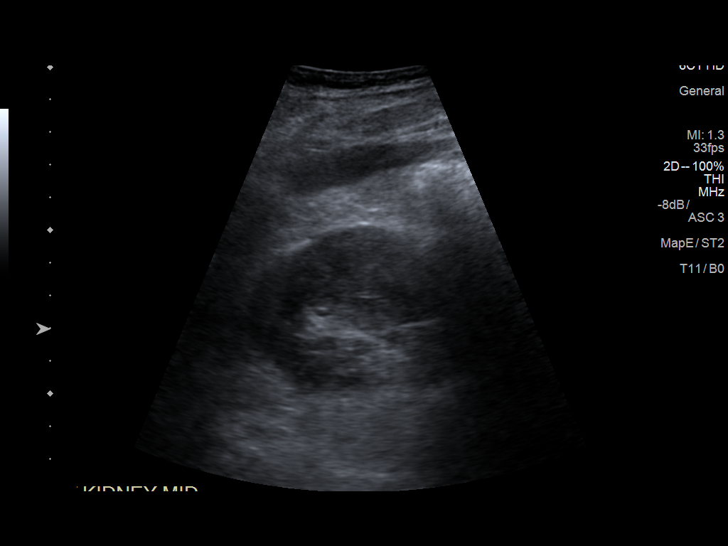
[im 12/24]
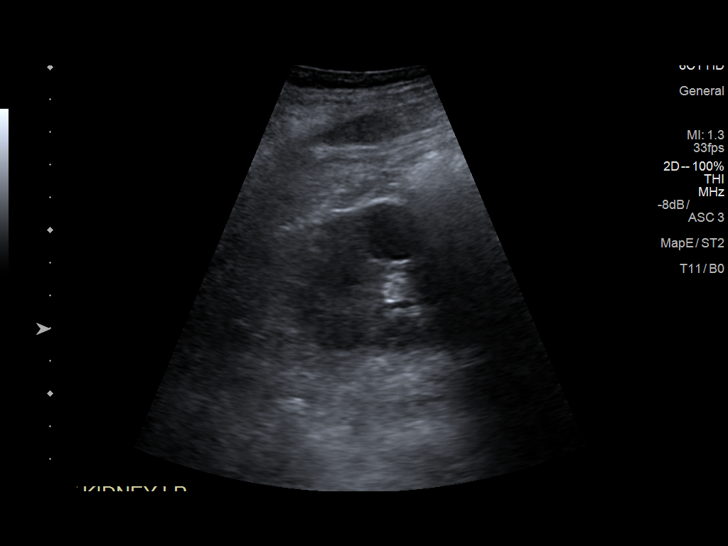
[im 13/24]
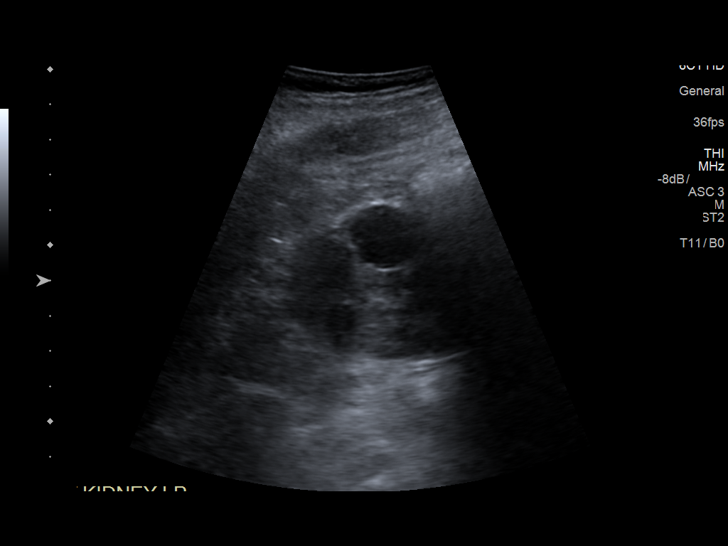
[im 15/24]
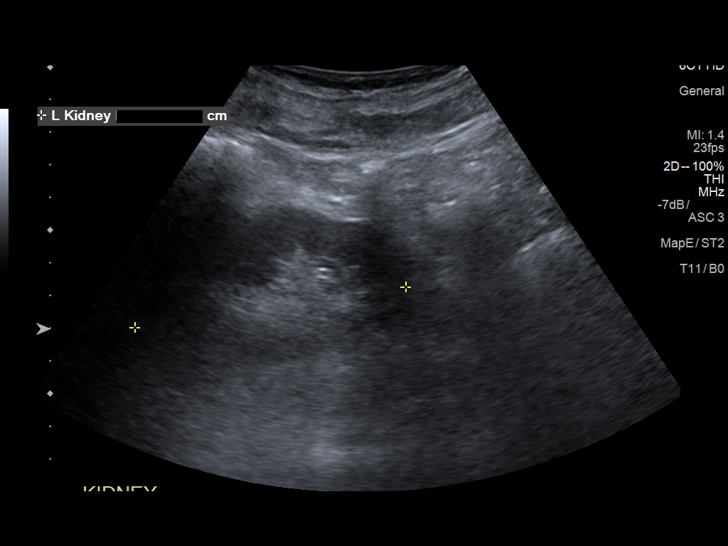
[im 17/24]
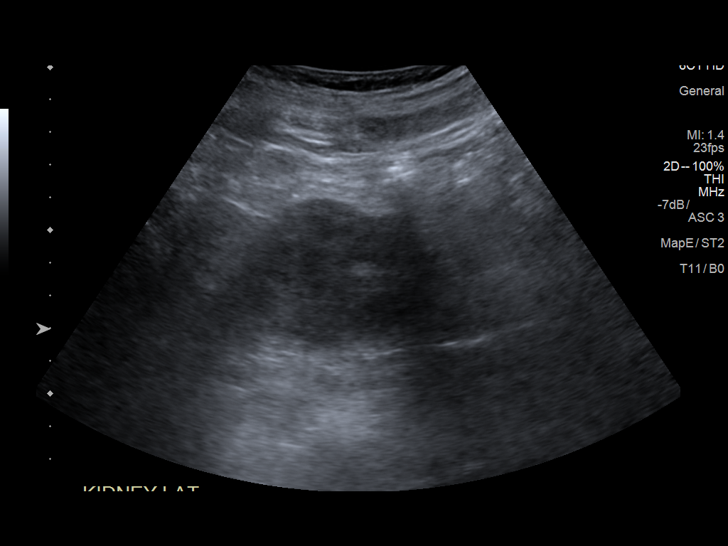
[im 19/24]
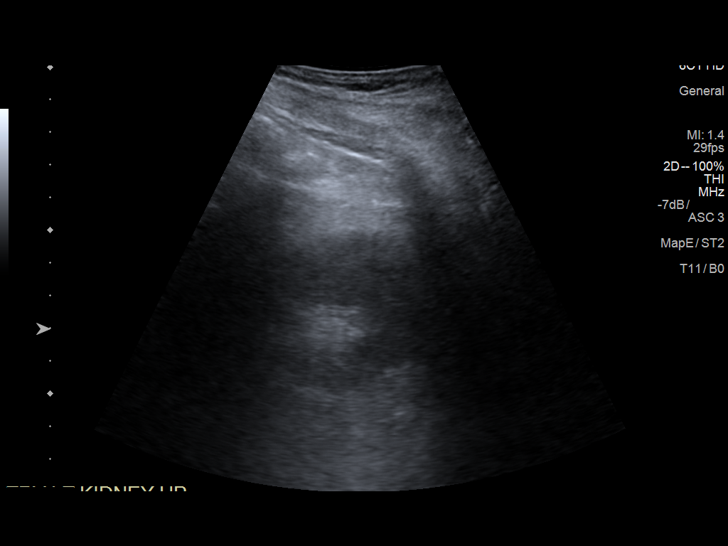
[im 20/24]
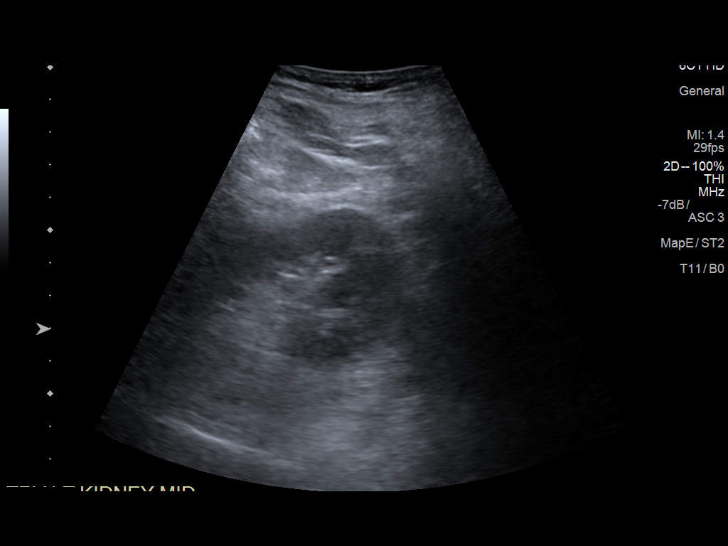
[im 22/24]
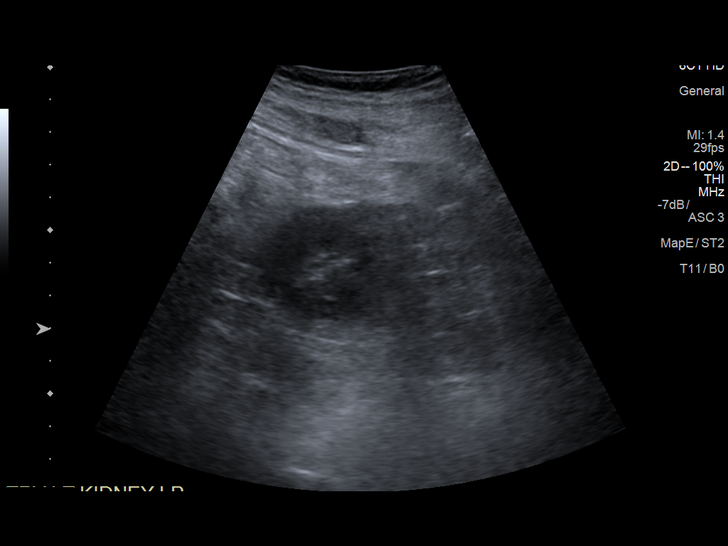
[im 24/24]
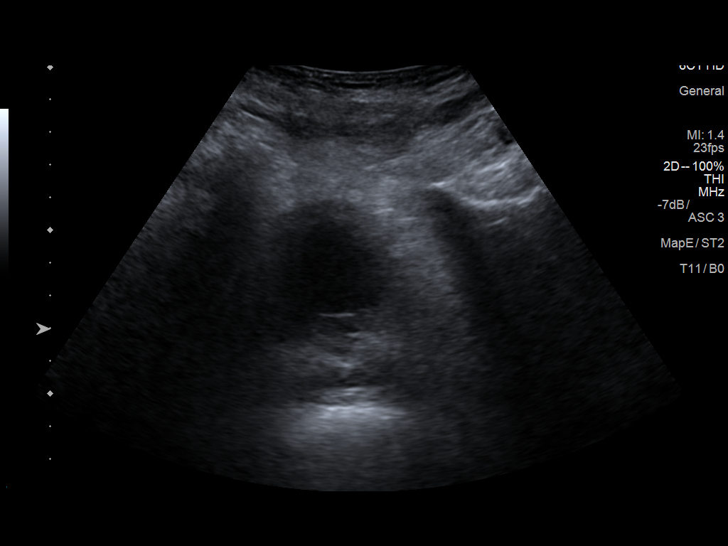

[14 of 24 positions shown; findings below may reference images not displayed]

FINDINGS: Right Kidney:

Length: 8.7 cm. Echogenicity within normal limits. No
hydronephrosis. A 1.8 by 1.6 by 1.8 cm hypoechoic lesion of the
right kidney lower pole has accentuated through transmission
characteristic of a cyst, but may have some faint internal echoes.

Left Kidney:

Length: 8.4 cm. Echogenicity within normal limits. No mass or
hydronephrosis visualized.

Bladder:

Appears normal for degree of bladder distention.
IMPRESSION: 1. Normal renal echogenicity. No hydronephrosis or definite renal
calculi observed. The cause of the patient's hematuria is not
identified.
2. 1.8 cm hypoechoic lesion of the right kidney lower pole has
accentuated through transmission typical for a cyst.

## 2020-05-30 IMAGING — DX DG CHEST 1V PORT
1 series · 1 of 1 positions shown · non-contrast
Comparison: 04/15/2018

CLINICAL DATA: SOB Pt was working with physical therapy at home
when he started feeling SOB and having generalized weakness. Pt has
on demand pacemaker and EMS noticed it was pacing at random rates
varying from 50-100. Pt has hx of CHF and CABG No chest pain

EXAM:
PORTABLE CHEST 1 VIEW

[chest ap]
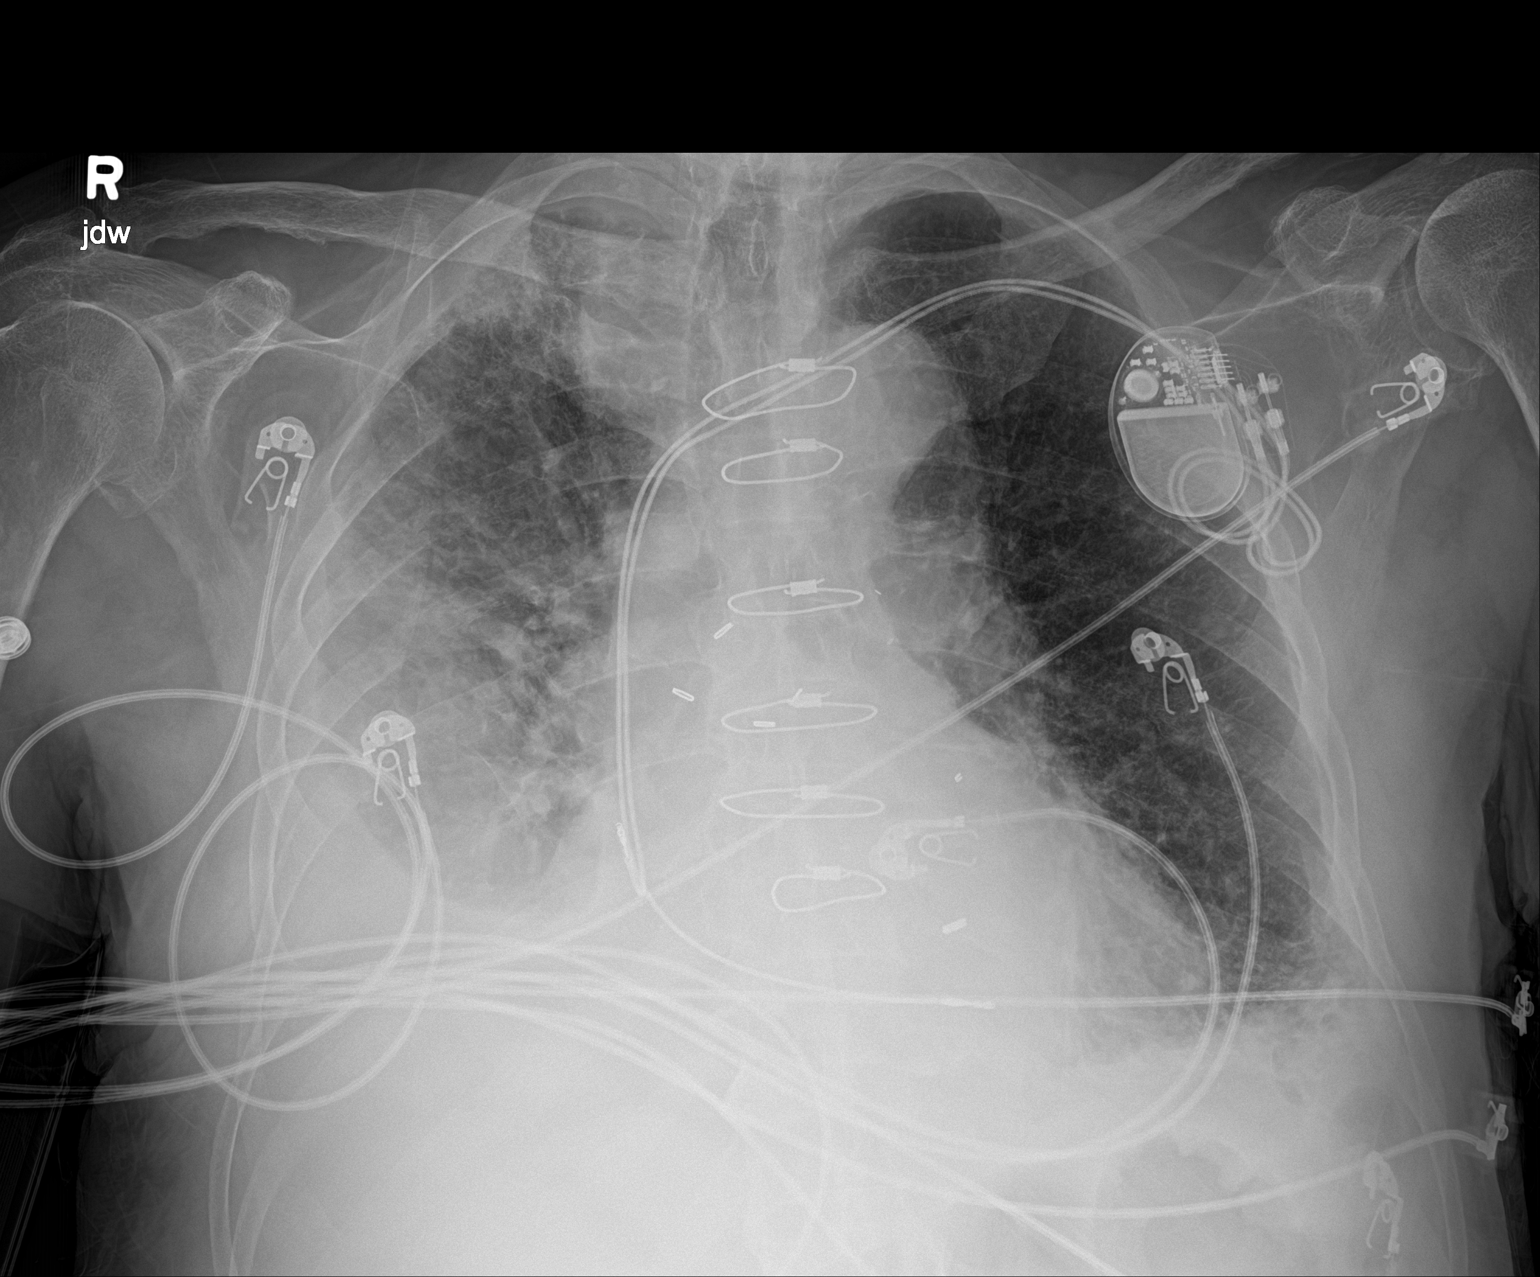

[1 of 1 positions shown; findings below may reference images not displayed]

FINDINGS: Moderate to large right pleural effusion. Right basilar airspace
opacity compatible with atelectasis or pneumonia. Mildly confluent
airspace opacity at the right lung apex, roughly similar to prior.
Bilateral interstitial accentuation with mild cardiomegaly and prior
median sternotomy and CABG. Minimal blunting of the left lateral
costophrenic angle with indistinct airspace opacity at left lung
base.

Dual lead pacer in place.
IMPRESSION: 1. Enlarged right pleural effusion with stable right apical airspace
opacity and increased bibasilar airspace opacities potentially from
edema or multifocal pneumonia.
2. Small left pleural effusion.
3. Mild to moderate enlargement of the cardiopericardial silhouette
with prior CABG.

## 2020-05-31 IMAGING — DX DG CHEST 1V
1 series · 1 of 1 positions shown · non-contrast
Comparison: May 24, 2018 and March 18, 2018

CLINICAL DATA: Status post right-sided thoracentesis.

EXAM:
CHEST  1 VIEW

[x chest ap]
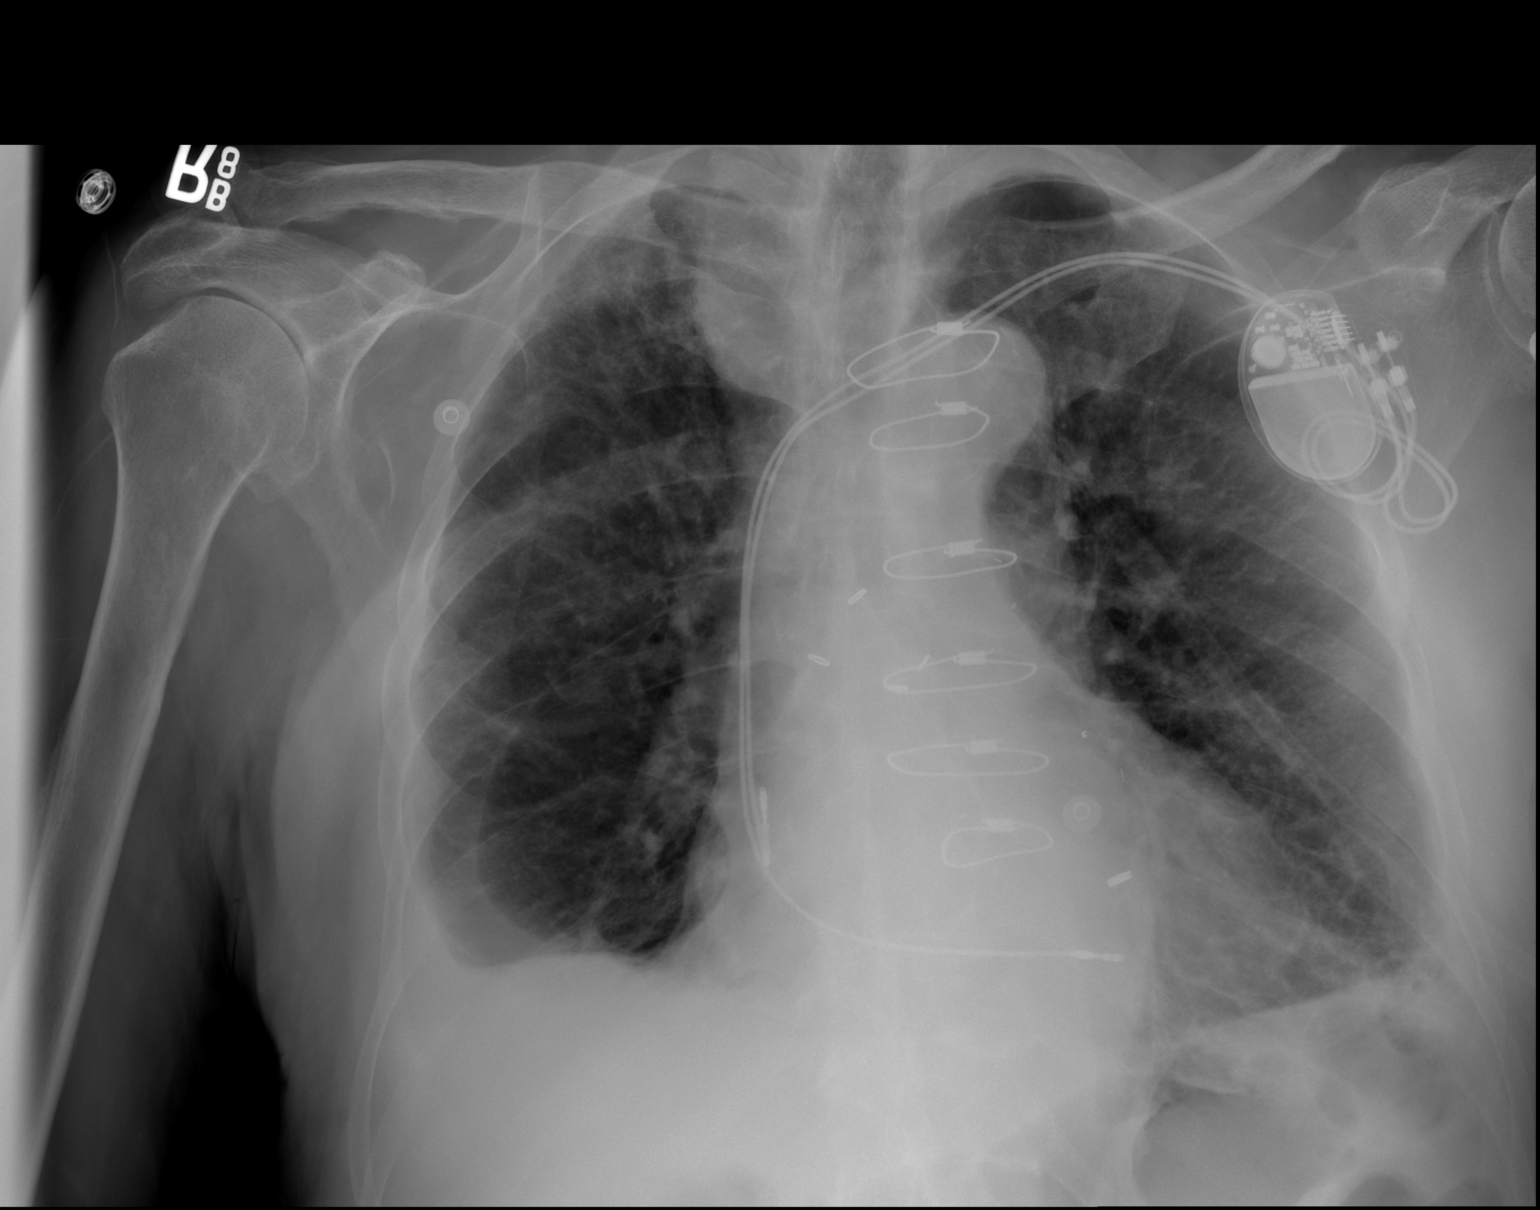

[1 of 1 positions shown; findings below may reference images not displayed]

FINDINGS: No pneumothorax. Right pleural effusion smaller following
thoracentesis. Small right pleural effusion remains. There is stable
scarring in the upper lobes, more on the right than on the left.
There is a minimal left pleural effusion with scarring in the left
base. There is no frank airspace consolidation.

Heart is upper normal in size with pulmonary vascularity normal.
Pacemaker leads are attached to the right atrium and right
ventricle. Patient is status post coronary artery bypass grafting.
Prominence in the right paratracheal region is stable and felt to
represent great vessel prominence in this age group. No bone lesions
evident.
IMPRESSION: Right pleural effusion smaller following thoracentesis. No
pneumothorax. Fairly small right pleural effusion remains. Areas of
scarring in the upper lobes and left base remain. There is a minimal
left pleural effusion. Stable cardiac silhouette.

## 2020-07-01 IMAGING — DX DG CHEST 1V PORT
1 series · 1 of 1 positions shown · non-contrast
Comparison: Chest radiograph 06/02/2018

CLINICAL DATA: Patient with tremors

EXAM:
PORTABLE CHEST 1 VIEW

[chest]
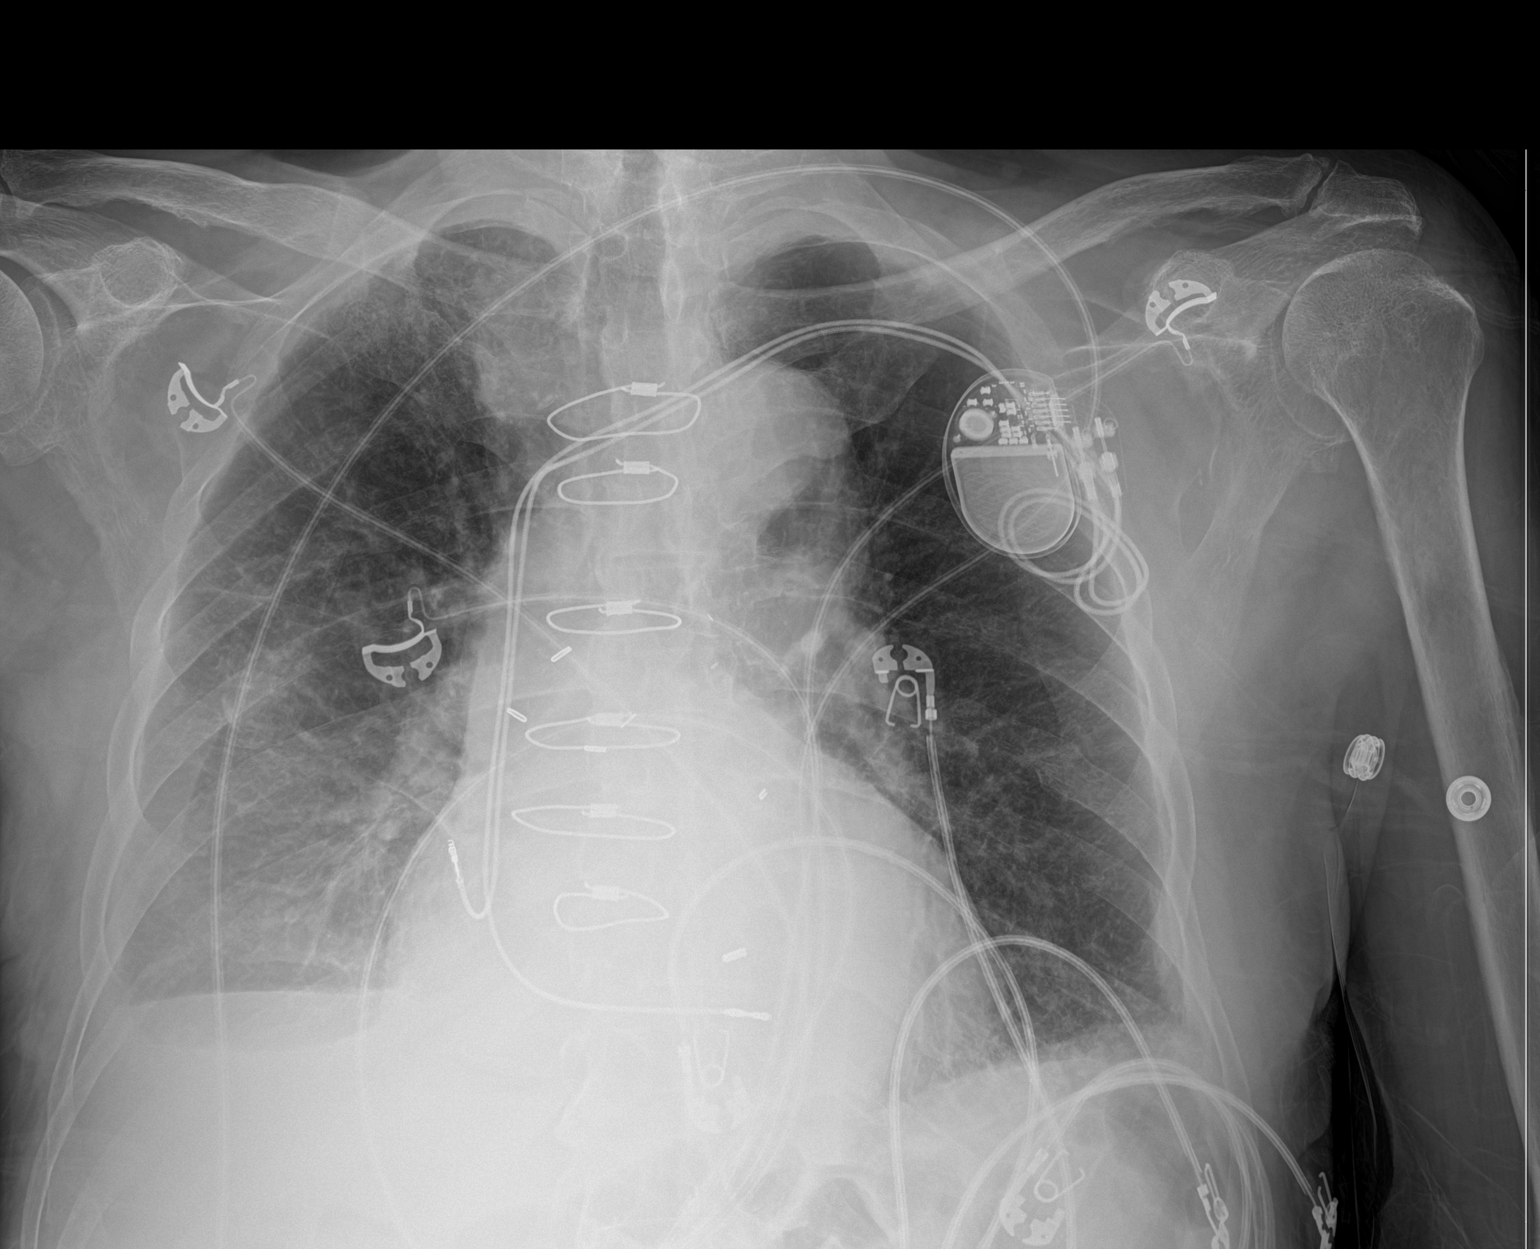

[1 of 1 positions shown; findings below may reference images not displayed]

FINDINGS: Multi lead pacer apparatus overlies the left hemithorax, leads are
stable in position. Monitoring leads overlie the patient. Stable
cardiomegaly status post median sternotomy. Small right pleural
effusion with heterogeneous opacities right lung base. Bilateral
interstitial opacities, right greater than left. No pneumothorax.
Thoracic spine degenerative changes.
IMPRESSION: Small right pleural effusion with underlying opacities which may
represent atelectasis or infection.

Asymmetric right-greater-than-left interstitial opacities may
represent edema.

## 2020-07-08 IMAGING — DX DG CHEST 1V PORT
1 series · 1 of 1 positions shown · non-contrast
Comparison: 06/29/2018 chest radiograph.

CLINICAL DATA: CHF

EXAM:
PORTABLE CHEST 1 VIEW

[chest ap]
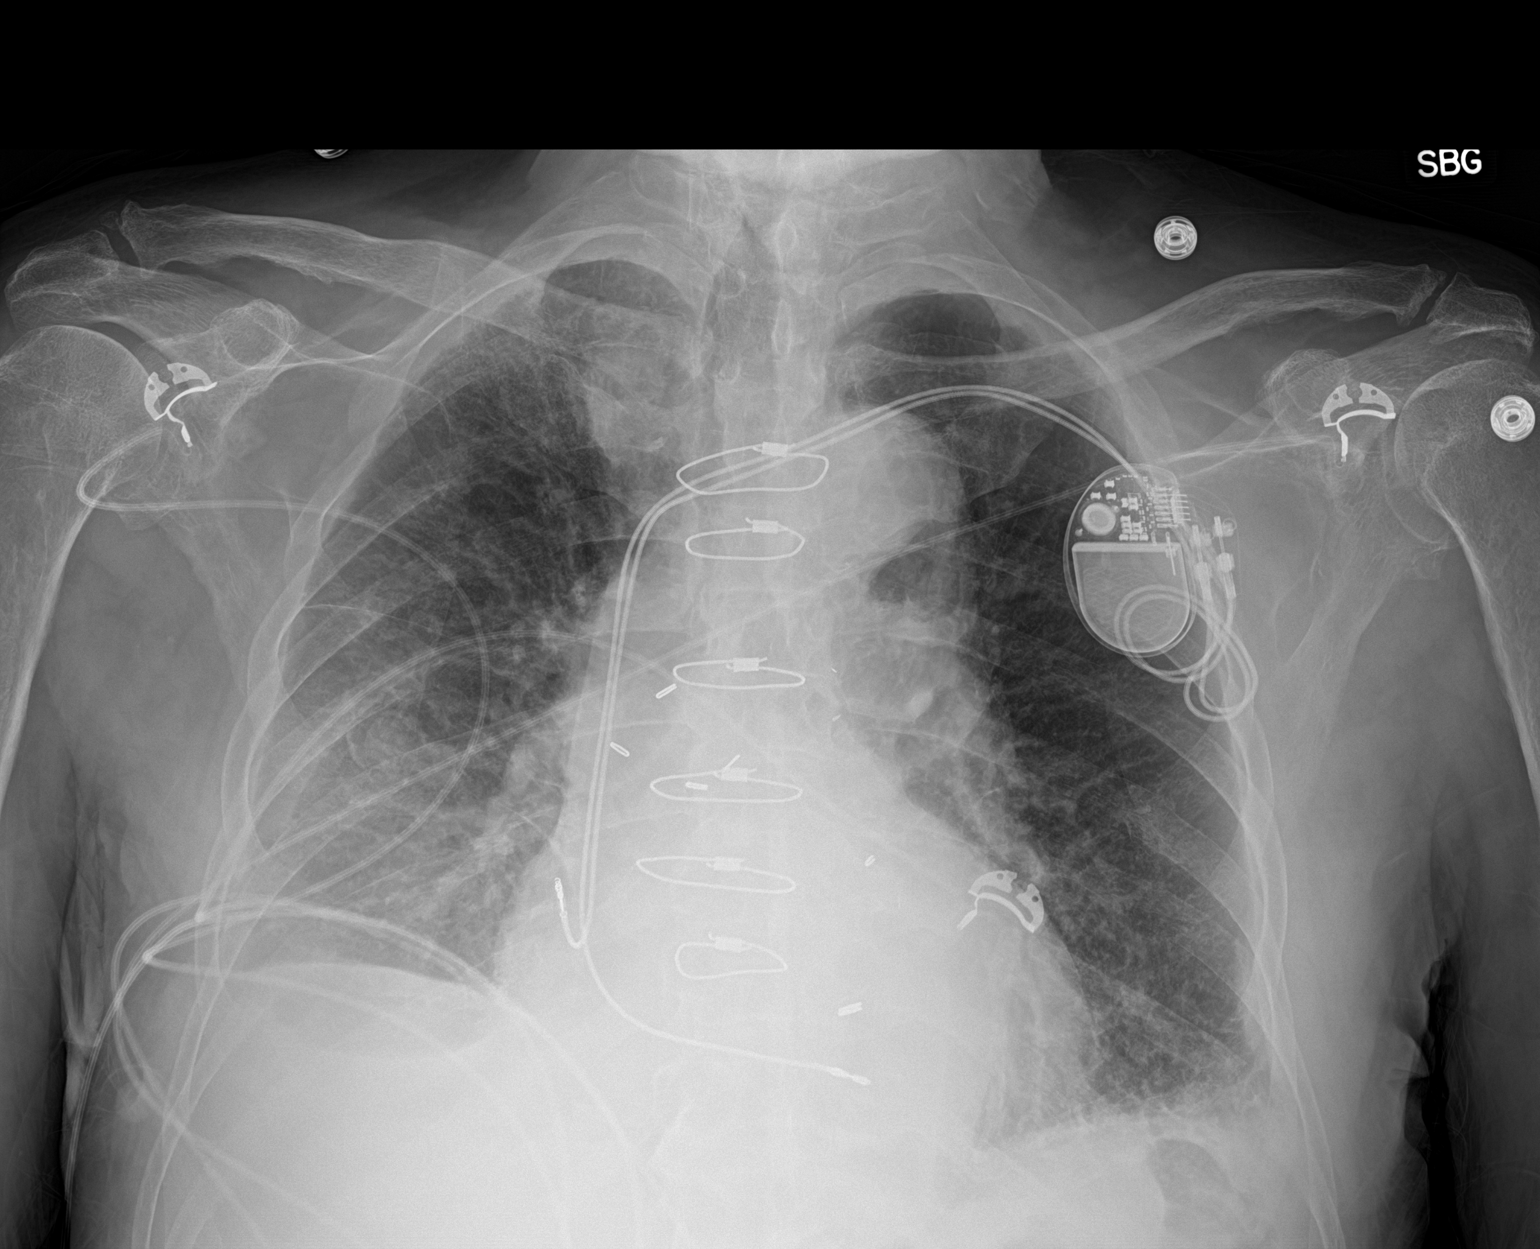

[1 of 1 positions shown; findings below may reference images not displayed]

FINDINGS: Intact sternotomy wires. Stable 2 lead left subclavian pacemaker.
Stable cardiomediastinal silhouette with borderline mild
cardiomegaly. No pneumothorax. Small bilateral pleural effusions,
stable. No overt pulmonary edema. Stable bibasilar scarring versus
atelectasis.
IMPRESSION: Stable chest radiograph with borderline mild cardiomegaly, no overt
pulmonary edema and small bilateral pleural effusions with mild
bibasilar scarring versus atelectasis.
